# Patient Record
Sex: Female | Born: 1940 | Race: White | Hispanic: No | Marital: Married | Smoking: Former smoker
Health system: Southern US, Community
[De-identification: ages and names within clinical notes are randomized; demographics above are authoritative.]

## PROBLEM LIST (undated history)

## (undated) DIAGNOSIS — Z923 Personal history of irradiation: Secondary | ICD-10-CM

## (undated) DIAGNOSIS — H409 Unspecified glaucoma: Secondary | ICD-10-CM

## (undated) DIAGNOSIS — M1990 Unspecified osteoarthritis, unspecified site: Secondary | ICD-10-CM

## (undated) DIAGNOSIS — F0390 Unspecified dementia without behavioral disturbance: Secondary | ICD-10-CM

## (undated) DIAGNOSIS — H9190 Unspecified hearing loss, unspecified ear: Secondary | ICD-10-CM

## (undated) DIAGNOSIS — C50212 Malignant neoplasm of upper-inner quadrant of left female breast: Secondary | ICD-10-CM

## (undated) DIAGNOSIS — M810 Age-related osteoporosis without current pathological fracture: Secondary | ICD-10-CM

## (undated) DIAGNOSIS — M25511 Pain in right shoulder: Secondary | ICD-10-CM

## (undated) DIAGNOSIS — E785 Hyperlipidemia, unspecified: Secondary | ICD-10-CM

## (undated) DIAGNOSIS — R200 Anesthesia of skin: Secondary | ICD-10-CM

## (undated) DIAGNOSIS — R413 Other amnesia: Secondary | ICD-10-CM

## (undated) DIAGNOSIS — R2689 Other abnormalities of gait and mobility: Secondary | ICD-10-CM

## (undated) DIAGNOSIS — Z973 Presence of spectacles and contact lenses: Secondary | ICD-10-CM

## (undated) DIAGNOSIS — D329 Benign neoplasm of meninges, unspecified: Secondary | ICD-10-CM

## (undated) DIAGNOSIS — Z9221 Personal history of antineoplastic chemotherapy: Secondary | ICD-10-CM

## (undated) DIAGNOSIS — N6019 Diffuse cystic mastopathy of unspecified breast: Secondary | ICD-10-CM

## (undated) DIAGNOSIS — R202 Paresthesia of skin: Secondary | ICD-10-CM

## (undated) DIAGNOSIS — H269 Unspecified cataract: Secondary | ICD-10-CM

## (undated) DIAGNOSIS — M5136 Other intervertebral disc degeneration, lumbar region: Secondary | ICD-10-CM

## (undated) DIAGNOSIS — D496 Neoplasm of unspecified behavior of brain: Secondary | ICD-10-CM

## (undated) DIAGNOSIS — C50919 Malignant neoplasm of unspecified site of unspecified female breast: Secondary | ICD-10-CM

## (undated) DIAGNOSIS — M51369 Other intervertebral disc degeneration, lumbar region without mention of lumbar back pain or lower extremity pain: Secondary | ICD-10-CM

## (undated) HISTORY — DX: Other intervertebral disc degeneration, lumbar region: M51.36

## (undated) HISTORY — DX: Unspecified osteoarthritis, unspecified site: M19.90

## (undated) HISTORY — DX: Unspecified glaucoma: H40.9

## (undated) HISTORY — DX: Benign neoplasm of meninges, unspecified: D32.9

## (undated) HISTORY — DX: Pain in right shoulder: M25.511

## (undated) HISTORY — DX: Neoplasm of unspecified behavior of brain: D49.6

## (undated) HISTORY — DX: Malignant neoplasm of upper-inner quadrant of left female breast: C50.212

## (undated) HISTORY — DX: Other intervertebral disc degeneration, lumbar region without mention of lumbar back pain or lower extremity pain: M51.369

## (undated) HISTORY — DX: Unspecified hearing loss, unspecified ear: H91.90

## (undated) HISTORY — DX: Age-related osteoporosis without current pathological fracture: M81.0

## (undated) HISTORY — DX: Hyperlipidemia, unspecified: E78.5

## (undated) HISTORY — DX: Diffuse cystic mastopathy of unspecified breast: N60.19

## (undated) HISTORY — DX: Unspecified dementia, unspecified severity, without behavioral disturbance, psychotic disturbance, mood disturbance, and anxiety: F03.90

---
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
## 1993-07-28 DIAGNOSIS — D496 Neoplasm of unspecified behavior of brain: Secondary | ICD-10-CM

## 1993-07-28 HISTORY — PX: BRAIN SURGERY: SHX531

## 1993-07-28 HISTORY — DX: Neoplasm of unspecified behavior of brain: D49.6

## 1995-07-29 HISTORY — PX: BREAST BIOPSY: SHX20

## 1995-07-29 HISTORY — PX: BREAST EXCISIONAL BIOPSY: SUR124

## 2004-07-09 ENCOUNTER — Ambulatory Visit: Payer: Self-pay

## 2005-02-10 ENCOUNTER — Ambulatory Visit: Payer: Self-pay | Admitting: Ophthalmology

## 2005-03-06 ENCOUNTER — Ambulatory Visit: Payer: Self-pay | Admitting: Neurology

## 2005-08-21 ENCOUNTER — Ambulatory Visit: Payer: Self-pay

## 2006-08-25 ENCOUNTER — Ambulatory Visit: Payer: Self-pay

## 2007-09-30 ENCOUNTER — Ambulatory Visit: Payer: Self-pay

## 2008-07-28 HISTORY — PX: COLONOSCOPY: SHX174

## 2008-11-01 ENCOUNTER — Ambulatory Visit: Payer: Self-pay

## 2009-02-09 ENCOUNTER — Ambulatory Visit: Payer: Self-pay | Admitting: Unknown Physician Specialty

## 2009-11-06 ENCOUNTER — Ambulatory Visit: Payer: Self-pay

## 2010-11-08 ENCOUNTER — Ambulatory Visit: Payer: Self-pay

## 2011-11-26 ENCOUNTER — Ambulatory Visit: Payer: Self-pay

## 2012-08-12 ENCOUNTER — Ambulatory Visit: Payer: Self-pay | Admitting: Specialist

## 2013-07-30 ENCOUNTER — Emergency Department: Payer: Self-pay | Admitting: Emergency Medicine

## 2014-07-28 DIAGNOSIS — C50919 Malignant neoplasm of unspecified site of unspecified female breast: Secondary | ICD-10-CM

## 2014-07-28 HISTORY — DX: Malignant neoplasm of unspecified site of unspecified female breast: C50.919

## 2014-09-08 ENCOUNTER — Ambulatory Visit: Payer: Self-pay | Admitting: Neurology

## 2014-10-31 ENCOUNTER — Encounter
Admit: 2014-10-31 | Disposition: A | Discharge status: Home / Self Care | Payer: Self-pay | Attending: Neurology | Admitting: Neurology

## 2014-11-29 ENCOUNTER — Ambulatory Visit: Payer: Self-pay | Admitting: Physical Therapy

## 2014-12-05 ENCOUNTER — Encounter: Payer: Self-pay | Admitting: Physical Therapy

## 2014-12-05 ENCOUNTER — Ambulatory Visit: Payer: Medicare Other | Attending: Neurology | Admitting: Physical Therapy

## 2014-12-05 DIAGNOSIS — M25631 Stiffness of right wrist, not elsewhere classified: Secondary | ICD-10-CM | POA: Insufficient documentation

## 2014-12-05 DIAGNOSIS — M25551 Pain in right hip: Secondary | ICD-10-CM | POA: Diagnosis not present

## 2014-12-05 NOTE — Therapy (Signed)
[place] MAIN [facility] SERVICES [address] [place], [place], [address] Phone: [phone]   Fax:  [phone]  Physical Therapy Treatment  Patient Details  Name: [name] MRN: [id number] Date of Birth: [birth year] Referring Provider:  Vladimir Crofts, MD  Encounter Date: 12/05/2014      PT End of Session - 12/05/14 1508    Visit Number 5   Number of Visits 16   Date for PT Re-Evaluation 12/26/14   PT Start Time 3716   PT Stop Time 1500   PT Time Calculation (min) 45 min   Activity Tolerance Patient tolerated treatment well   Behavior During Therapy [facility] for tasks assessed/performed      History reviewed. No pertinent past medical history.  History reviewed. No pertinent past surgical history.  There were no vitals filed for this visit.  Visit Diagnosis:  Pain in right hip      Subjective Assessment - 12/05/14 1423    Subjective Patiient has been traveling and her h right ip pain is 5/10   Currently in Pain? Yes   Pain Score 5    Pain Location Hip   Pain Orientation Right   Pain Descriptors / Indicators Aching   Pain Type Chronic pain   Pain Onset More than a month ago   Pain Frequency Intermittent   Multiple Pain Sites No       Manual therapy including long axis distraction to right hip x 5 minutes x 3 with 0/10 pain following treatment.  Therapeutic exercise including fitter abd x 5 mintues, hip abd squat x 60 feet x 2, squat on wall with theraball, BOSU ball squats with min assist, lunges into BOSU ball x 10 bilaterally.  Gait training x 1000 feet with no pain or increased symptoms.                          PT Education - 12/05/14 1507    Education provided Yes   Person(s) Educated Patient   Methods Explanation;Demonstration   Comprehension Verbalized understanding;Returned demonstration             PT Long Term Goals - 12/05/14 1438    PT LONG TERM GOAL #1   Title (p) . Patient  will be able to ambulate > 300 feet without gait deviation independently, demonstrating age appropriate movement and coordination patterns in 8 weeks. New (Est. on 10/31/2014)   PT LONG TERM GOAL #2   Title (p) . Patient will be able to ambulate > 1000 feet with least restrictive assistive device for safety with independent community ambulation in 8 weeks. New (Est on 10/31/2014)   PT LONG TERM GOAL #3   Title (p) . Patient will be able to perform a squat without asymmetrical postural deviation/compensation in transverse anatomical plane in 8 weeks. New (Est on 10/31/2014)   PT LONG TERM GOAL #4   Title (p)  Patient will be independent with home exercise program for prevention/ self-management of hip symptoms/ difficulty in 8 weeks. New (Est. on 10/31/2014)               Plan - 12/05/14 1636    Clinical Impression Statement Fatigue still evident with cross trainer and endurance. Verbal cues needed to keep hips aligned with abduction.Patient has 5/10 prior to manual therapy and 0/10 following manual therapy. She will continue to benefit from skilled PT to improve mobiity and ambulation distance.    Pt will  benefit from skilled therapeutic intervention in order to improve on the following deficits Abnormal gait;Decreased endurance;Decreased activity tolerance;Decreased strength;Decreased balance;Decreased mobility;Difficulty walking;Pain   Rehab Potential Good   PT Frequency 2x / week   PT Duration 6 weeks   Consulted and Agree with Plan of Care Patient        Problem List There are no active problems to display for this patient.   Alanson Puls 12/05/2014, 4:48 PM  [place] MAIN [facility] SERVICES [address] [place], [place], [address] Phone: [phone]   Fax:  [phone]

## 2014-12-07 ENCOUNTER — Ambulatory Visit: Payer: Medicare Other | Attending: Neurology | Admitting: Physical Therapy

## 2014-12-07 ENCOUNTER — Ambulatory Visit: Payer: Self-pay | Admitting: Physical Therapy

## 2014-12-07 ENCOUNTER — Encounter: Payer: Self-pay | Admitting: Physical Therapy

## 2014-12-07 DIAGNOSIS — M25631 Stiffness of right wrist, not elsewhere classified: Secondary | ICD-10-CM | POA: Insufficient documentation

## 2014-12-07 DIAGNOSIS — M25551 Pain in right hip: Secondary | ICD-10-CM | POA: Diagnosis not present

## 2014-12-07 NOTE — Therapy (Signed)
[place] MAIN [facility] SERVICES [address] [place], [place], [address] Phone: [phone]   Fax:  [phone]  Physical Therapy Treatment  Patient Details  Name: [name] MRN: [id number] Date of Birth: December 27, 1940 Referring Provider:  Vladimir Crofts, MD  Encounter Date: 12/07/2014      PT End of Session - 12/07/14 1356    Visit Number 6   Number of Visits 16   Date for PT Re-Evaluation 12/26/14   PT Start Time 1443   PT Stop Time 1430   PT Time Calculation (min) 45 min   Activity Tolerance Patient tolerated treatment well   Behavior During Therapy [facility] for tasks assessed/performed      History reviewed. No pertinent past medical history.  History reviewed. No pertinent past surgical history.  There were no vitals filed for this visit.  Visit Diagnosis:  Pain in right hip      Subjective Assessment - 12/07/14 1352    Subjective Pt worked in the yard for 6 hours since the last PT visit. She says that her pain never really goes away and she is tired of it hurting. She fell backwards from a step and slid down and scrapped her left hip on the bricks.    Pain Score 7    Pain Location Hip   Pain Orientation Right   Pain Descriptors / Indicators Aching  tender   Pain Type Chronic pain   Pain Onset More than a month ago   Pain Frequency Intermittent         Therapeutic exercises:  Supine exercises instructed for strengthening of hip muscles; bridging, hip abd/ER with RTB  sidelying  Clam, SLR,  Manual therapy long axis  X 1 minute due to ankle pain it was stopped. [place] to right hip x 8 minutes at 1.5 CM constant. Patient educated about HEP and heat to decrease pain.                        PT Education - 12/07/14 1355    Person(s) Educated Patient   Methods Explanation;Demonstration   Comprehension Verbalized understanding;Returned demonstration;Verbal cues required             PT Long Term Goals  - 12/05/14 1438    PT LONG TERM GOAL #1   Title (p) . Patient will be able to ambulate > 300 feet without gait deviation independently, demonstrating age appropriate movement and coordination patterns in 8 weeks. New (Est. on 10/31/2014)   PT LONG TERM GOAL #2   Title (p) . Patient will be able to ambulate > 1000 feet with least restrictive assistive device for safety with independent community ambulation in 8 weeks. New (Est on 10/31/2014)   PT LONG TERM GOAL #3   Title (p) . Patient will be able to perform a squat without asymmetrical postural deviation/compensation in transverse anatomical plane in 8 weeks. New (Est on 10/31/2014)   PT LONG TERM GOAL #4   Title (p)  Patient will be independent with home exercise program for prevention/ self-management of hip symptoms/ difficulty in 8 weeks. New (Est. on 10/31/2014)               Plan - 12/07/14 1356    Clinical Impression Statement Patient instructed in progression of HEP. She has tender area to left hip and fatigues with exercises to hip. she will continue to benefit from skilled PT to improve strength and mobility. Verbal cues  needed to keep hips aligned with abductionMuscle fatigue but no major pain complaints. Patient advancing to red theraband for exercises Muscle fatigue but no major pain complaints. Patient advancing to red theraband for exercises         Problem List There are no active problems to display for this patient. Alanson Puls, PT, DPT  [name], [place] S 12/07/2014, 3:28 PM  [place] MAIN [facility] SERVICES [address] [place], [place], [address] Phone: [phone]   Fax:  [phone]

## 2014-12-12 ENCOUNTER — Ambulatory Visit
Admission: RE | Admit: 2014-12-12 | Discharge: 2014-12-12 | Disposition: A | Discharge status: Home / Self Care | Payer: Medicare Other | Source: Ambulatory Visit | Attending: Unknown Physician Specialty | Admitting: Unknown Physician Specialty

## 2014-12-12 ENCOUNTER — Ambulatory Visit: Payer: Medicare Other | Admitting: Physical Therapy

## 2014-12-12 ENCOUNTER — Encounter: Payer: Self-pay | Admitting: Physical Therapy

## 2014-12-12 ENCOUNTER — Other Ambulatory Visit: Payer: Self-pay | Admitting: Unknown Physician Specialty

## 2014-12-12 DIAGNOSIS — M25551 Pain in right hip: Secondary | ICD-10-CM | POA: Diagnosis not present

## 2014-12-12 DIAGNOSIS — N63 Unspecified lump in breast: Secondary | ICD-10-CM | POA: Diagnosis not present

## 2014-12-12 DIAGNOSIS — N6489 Other specified disorders of breast: Secondary | ICD-10-CM

## 2014-12-12 DIAGNOSIS — R928 Other abnormal and inconclusive findings on diagnostic imaging of breast: Secondary | ICD-10-CM

## 2014-12-12 DIAGNOSIS — M25631 Stiffness of right wrist, not elsewhere classified: Secondary | ICD-10-CM | POA: Diagnosis not present

## 2014-12-12 NOTE — Therapy (Addendum)
[place] MAIN [facility] SERVICES [address] [place], [place], [address] Phone: [phone]   Fax:  [phone]  Physical Therapy Treatment/ DC summary  Patient Details  Name: [name] MRN: [id number] Date of Birth: 28-Jan-1941 Referring Provider:  Vladimir Crofts, MD  Encounter Date: 12/12/2014      PT End of Session - 12/12/14 1330    Visit Number 7   Number of Visits 16   Date for PT Re-Evaluation 12/26/14   Activity Tolerance Patient tolerated treatment well   Behavior During Therapy [facility] for tasks assessed/performed      History reviewed. No pertinent past medical history.  Past Surgical History  Procedure Laterality Date  . Breast biopsy Left 1987    There were no vitals filed for this visit.  Visit Diagnosis:  Pain in right hip      Subjective Assessment - 12/12/14 1328    Subjective Patient is having 8/10 pain to left hip and it is rainning outside and cold. She thinks that it could be hip arthritis. Patient was recommended to see her MD due to her increased pain.       Manual therapy long axis X 30 bouts x 3, tennis ball to buttocks x 30 sec x 3 bouts with pain relief. [place] to right hip x 8 minutes at 1.5 CM constant. Patient educated about HEP and heat to decrease pain.  E-stim x 20 mintues IFC to right hip and hot pack to right hip x 20 minutes. Patient has 8/10 pain to right groin, right buttocks and down right thigh prior to treatment and is able to decrease her pain after modalities and manual therapy to 0/10.     PHYSICAL THERAPY DISCHARGE SUMMARY  Visits from Start of Care:   Current functional level related to goals / functional outcomes: Patient is able to ambulate intermediate distances    Remaining deficits:chronic pain , difficulty walking, and tenderness to palpation.    Education / Equipment: reviewed HEP. Plan: Patient agrees to discharge.  Patient goals were not met. Patient is being  discharged due to lack of progress.  ?????                          PT Education - 12/12/14 1329    Education provided Yes   Education Details Educated in her symptoms that are relieved by manual therapy compared to the modalities and that arthritis pain is relieved with long axis distraction. Educated in HEP progression.    Person(s) Educated Patient   Methods Explanation;Demonstration;Tactile cues;Verbal cues   Comprehension Verbalized understanding;Returned demonstration;Verbal cues required             PT Long Term Goals - 12/12/14 1452    PT LONG TERM GOAL #1   Title . Patient will be able to ambulate > 300 feet without gait deviation independently, demonstrating age appropriate movement and coordination patterns in 8 weeks. New (Est. on 10/31/2014)   Status Partially Met   PT LONG TERM GOAL #2   Title . Patient will be able to ambulate > 1000 feet with least restrictive assistive device for safety with independent community ambulation in 8 weeks. New (Est on 10/31/2014)   Status Partially Met   PT LONG TERM GOAL #3   Title . Patient will be able to perform a squat without asymmetrical postural deviation/compensation in transverse anatomical plane in 8 weeks. New (Est on 10/31/2014)   Status Partially  Met   PT LONG TERM GOAL #4   Title  Patient will be independent with home exercise program for prevention/ self-management of hip symptoms/ difficulty in 8 weeks. New (Est. on 10/31/2014)   Status Achieved               Plan - 12/12/14 1331    Clinical Impression Statement Patient has increased pain today and the weather is rainny and cold. She has had pain relief in past appointments with manual therapy to her hip but she reports that it is difficult on her right ankle and wants to try another type of treatment to see if it helps her pain. Patient tolerates [place] , e-stim and heat and has some pain reduction after therapy, and better pain relief to groin  after long axis distraction.  Patient has good reduction of pain after treatment but it does not last and the pain cycle begins again the next day. Patient will be DCed  From PT  and is recommended to go back to MD for follow up.     Pt will benefit from skilled therapeutic intervention in order to improve on the following deficits Abnormal gait;Decreased endurance;Decreased activity tolerance;Decreased strength;Decreased balance;Decreased mobility;Difficulty walking;Pain   Rehab Potential Good   PT Frequency 2x / week   PT Duration 6 weeks        Problem List There are no active problems to display for this patient.  Alanson Puls, PT, DPT [place], [place] S 12/12/2014, 2:53 PM  [place] MAIN [facility] SERVICES [address] [place], [place], [address] Phone: [phone]   Fax:  [phone]

## 2014-12-13 ENCOUNTER — Other Ambulatory Visit: Payer: Self-pay | Admitting: Surgery

## 2014-12-13 DIAGNOSIS — N63 Unspecified lump in unspecified breast: Secondary | ICD-10-CM

## 2014-12-13 DIAGNOSIS — R928 Other abnormal and inconclusive findings on diagnostic imaging of breast: Secondary | ICD-10-CM

## 2014-12-14 ENCOUNTER — Ambulatory Visit: Payer: Self-pay | Admitting: Physical Therapy

## 2014-12-15 ENCOUNTER — Other Ambulatory Visit: Payer: Self-pay | Admitting: Surgery

## 2014-12-15 ENCOUNTER — Ambulatory Visit
Admission: RE | Admit: 2014-12-15 | Discharge: 2014-12-15 | Disposition: A | Discharge status: Home / Self Care | Payer: Medicare Other | Source: Ambulatory Visit | Attending: Surgery | Admitting: Surgery

## 2014-12-15 ENCOUNTER — Ambulatory Visit: Admission: RE | Admit: 2014-12-15 | Payer: Medicare Other | Source: Ambulatory Visit

## 2014-12-15 DIAGNOSIS — R928 Other abnormal and inconclusive findings on diagnostic imaging of breast: Secondary | ICD-10-CM | POA: Insufficient documentation

## 2014-12-15 DIAGNOSIS — N63 Unspecified lump in unspecified breast: Secondary | ICD-10-CM

## 2014-12-15 DIAGNOSIS — C50212 Malignant neoplasm of upper-inner quadrant of left female breast: Secondary | ICD-10-CM

## 2014-12-15 HISTORY — PX: BREAST BIOPSY: SHX20

## 2014-12-15 HISTORY — DX: Malignant neoplasm of upper-inner quadrant of left female breast: C50.212

## 2014-12-19 ENCOUNTER — Ambulatory Visit: Payer: Medicare Other | Admitting: Physical Therapy

## 2014-12-20 LAB — SURGICAL PATHOLOGY

## 2014-12-21 ENCOUNTER — Ambulatory Visit: Payer: Self-pay | Admitting: Physical Therapy

## 2014-12-28 ENCOUNTER — Telehealth: Payer: Self-pay | Admitting: *Deleted

## 2014-12-28 ENCOUNTER — Ambulatory Visit: Payer: Self-pay | Admitting: Surgery

## 2014-12-28 ENCOUNTER — Encounter: Payer: Self-pay | Admitting: *Deleted

## 2014-12-28 ENCOUNTER — Telehealth: Payer: Self-pay | Admitting: Surgery

## 2014-12-28 NOTE — Telephone Encounter (Signed)
Called patient to establish navigation services. She is newly diagnosed with breast cancer. Patient was unable to hear me very well and ask that I speak with her husband, Dr. Tillman Sers.  Discussed navigation services with him.  Discussed that Dr. Pat Patrick wanted Adaleen seen by Medical Oncology prior to any surgery.  He requested an appointment with Dr. Oliva Bustard.  Patient is scheduled to see him on Tuesday at 8:00.  Talked with Dr. Rolin Barry office to fax notes and send referral info.  Dr. Rolin Barry office stated they had just left the patient a message to follow-up with Dr. Pat Patrick tomorrow if possible between 3:30 and 4:30.  They would like to pick up their educational package tomorrow to have for the weekend.  Will leave at the desk with [place] if it has not been picked up before I leave on Friday.  They are to call if they have any questions or needs.

## 2014-12-28 NOTE — Telephone Encounter (Signed)
LVM that Dr. Pat Patrick will see pt in the [facility] office between 3:30 and 4:00. Pt will call before they go to make sure Dr. Azucena Fallen get called into surg. If so, they will see Dr. Marcelline Deist next day at 11:00

## 2014-12-29 ENCOUNTER — Ambulatory Visit (INDEPENDENT_AMBULATORY_CARE_PROVIDER_SITE_OTHER): Payer: Medicare Other | Admitting: Surgery

## 2014-12-29 ENCOUNTER — Ambulatory Visit: Payer: Self-pay | Admitting: Surgery

## 2014-12-29 ENCOUNTER — Encounter: Payer: Self-pay | Admitting: Surgery

## 2014-12-29 VITALS — BP 125/79 | HR 71 | Temp 98.9°F | Ht 64.0 in | Wt 143.8 lb

## 2014-12-29 DIAGNOSIS — C50912 Malignant neoplasm of unspecified site of left female breast: Secondary | ICD-10-CM

## 2014-12-29 NOTE — Progress Notes (Signed)
Met patient and her husband yesterday. Gave patient breast cancer educational literature, "My Breast Cancer Treatment Handbook" by Josephine Igo, RN.  Offered support. To call if they have any questions or needs.

## 2014-12-29 NOTE — Progress Notes (Signed)
Surgical Consultation  12/29/2014  [name] is an 74 y.o. female with a recently discovered ductal carcinoma in situ.  CC: She had a recent screening mammography which demonstrated multiple lesions in the 10:00 position of left breast. Biopsy was recommended. She has had previous breast evaluations. She is not having history of breast cancer. She does not take current hormonal medications. She has had 4 children.  HPI: Ultrasound-guided biopsy was recommended. Into the 3 lesions there was evidence of sclerosis but in the third lesion there was a segment which demonstrated some ductal carcinoma in situ. It is important to note that the specimen was quite small and could be part of the larger lesion which might contain invasive carcinoma. She is in the office today to discuss her her options with regard to surgical intervention.    Family Breast Cancer History: She has a sister with breast cancer  History reviewed. No pertinent past medical history.  Past Surgical History  Procedure Laterality Date  . Breast biopsy Left 1987    Family History  Problem Relation Age of Onset  . Breast cancer Sister 21    Social History:  reports that she quit smoking about 50 years ago. She does not have any smokeless tobacco history on file. She reports that she drinks about 4.2 oz of alcohol per week. She reports that she does not use illicit drugs.  Allergies: No Known Allergies  Medications reviewed.   Review of Systems:   ROS   Physical Exam:  BP 125/79 mmHg  Pulse 71  Temp(Src) 98.9 F (37.2 C) (Oral)  Ht $R'5\' 4"'mK$  (1.626 m)  Wt 65.227 kg (143 lb 12.8 oz)  BMI 24.67 kg/m2  Physical Exam  General she is alert and pleasant in no significant distress well oriented.  HEENT: No significant abnormalities noted, normal eyes normal ears.  Lymph exam: No cervical or axillary adenopathy identified.  Chest: No significant findings noted on chest exam with no adventitious sounds and  normal pulmonary excursion.  Cardiac: No murmurs or gallops are noted she appears to be normal sinus rhythm.  Abdomen: No significant abnormalities are identified with active bowel sounds and no tenderness no rebound or guarding.  Musculoskeletal: She has significant right hip pain on ambulation with an abnormal gait but normal distal pulses no other deformities noted. She also has decreased range of motion in her right shoulder.  Neurologic exam: Equal symmetrical cranial nerve function no motor sensory abnormalities.  Psych exam: Normal orientation normal affect.  Breast Exam: Multiple bruising areas in the upper inner quadrant. No palpable masses are identified. Axillary areas unremarkable.    No results found for this or any previous visit (from the past 48 hour(s)). No results found.  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 We anticipate offering her mastectomy versus partial mastectomy and radiation therapy. Her husband was present for the interview and requested information regarding MammoSite radiation therapy. They have an appointment with the oncologist later next week and I asked them to outline those requests to the cancer center. In this setting we do not have any idea whether she has invasive carcinoma in that lesion and I think partial mastectomy with sentinel lymph node biopsy is reasonable option despite the fact that she has ductal carcinoma in situ at the present time. We outlined surgical risks and options in detail. They are in agreement and will get back with [place] once they have  met with oncology.  Odin III dermatitis

## 2015-01-01 ENCOUNTER — Other Ambulatory Visit: Payer: Self-pay | Admitting: General Surgery

## 2015-01-01 ENCOUNTER — Encounter: Payer: Self-pay | Admitting: General Surgery

## 2015-01-01 ENCOUNTER — Ambulatory Visit (INDEPENDENT_AMBULATORY_CARE_PROVIDER_SITE_OTHER): Payer: Medicare Other | Admitting: General Surgery

## 2015-01-01 VITALS — BP 122/66 | HR 64 | Resp 12 | Ht 64.0 in | Wt 142.0 lb

## 2015-01-01 DIAGNOSIS — D0512 Intraductal carcinoma in situ of left breast: Secondary | ICD-10-CM

## 2015-01-01 DIAGNOSIS — C50912 Malignant neoplasm of unspecified site of left female breast: Secondary | ICD-10-CM

## 2015-01-01 NOTE — Patient Instructions (Signed)
Patient is scheduled for surgery at [facility] on 01/08/15. She will arrive at the [facility] at 8:00 am that day. She will pre admit with the hospital on 01/04/15 at 9:45 am. Patient is aware of dates, times, and instructions.

## 2015-01-01 NOTE — Progress Notes (Signed)
Patient ID: [name], female   DOB: [birth year], 74 y.o.   MRN: [id number]  Chief Complaint  Patient presents with  . Other    breast cancer discussion    HPI [name] is a 74 y.o. female here today for breast cancer discussion. Patient had mammogram done on 12/12/14 at [facility] which was a routine mammogram per patient there was 3 different areas on mammogram of concern. Patient then had a Left breast biopsy done on 12/15/14 which was positive for DCIS.   The patient is under evaluation for a right hip replacement due to "bone-on-bone" pain that is present day and night. HPI  Past Medical History  Diagnosis Date  . 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 Hearing loss     Past Surgical History  Procedure Laterality Date  . Appendectomy  1950  . Brain surgery  1995    left frontal/temporal  . Colonoscopy  2010    Dr. [name]  . Breast biopsy Left 1997  . Breast biopsy Left 12/15/14    confirmed DCIS    Family History  Problem Relation Age of Onset  . Breast cancer Sister 62  . Addison's disease Mother   . Stroke Father     Social History History  Substance Use Topics  . Smoking status: Former Smoker    Quit date: 12/28/1964  . Smokeless tobacco: Never Used  . Alcohol Use: 0.6 - 1.2 oz/week    1-2 Glasses of wine per week     Comment: 1 Glass Wine / Night    No Known Allergies  Current Outpatient Prescriptions  Medication Sig Dispense Refill  . Ascorbic Acid (VITAMIN C) 100 MG tablet Take 100 mg by mouth daily.    . calcium-vitamin D 250-100 MG-UNIT per tablet Take 1 tablet by mouth 1 day or 1 dose. 600 mg    . meloxicam (MOBIC) 7.5 MG tablet Take 7.5 mg by mouth daily.    . traMADol (ULTRAM) 50 MG tablet Take by mouth 1 day or 1 dose.    . Triprolidine-Pseudoephedrine (ANTIHISTAMINE PO) Take by mouth daily.     No current facility-administered medications for this visit.    Review of Systems Review of  Systems  Constitutional: Negative.   Respiratory: Negative.   Cardiovascular: Negative.     Blood pressure 122/66, pulse 64, resp. rate 12, height 5\' 4"  (1.626 m), weight 142 lb (64.411 kg).  Physical Exam Physical Exam  Constitutional: She is oriented to person, place, and time. She appears well-developed and well-nourished.  Cardiovascular: Normal rate, regular rhythm and normal heart sounds.   Pulmonary/Chest: Effort normal and breath sounds normal. Right breast exhibits no inverted nipple, no mass, no nipple discharge, no skin change and no tenderness. Left breast exhibits tenderness. Left breast exhibits no inverted nipple, no mass, no nipple discharge and no skin change. Breasts are symmetrical.  Bruising left upper inner  Neurological: She is alert and oriented to person, place, and time.  Skin: Skin is warm and dry.    Data Reviewed 2014-2016 mammograms reviewed. New nodular density in the left breast on the 2016 study prompted ultrasound were 3 areas of abnormality were reported. Core biopsies of each obtained. The area reported is most suspicious ( 10:00, 4 cm from the nipple biopsy results were negative. At the 5 cm mark, 10:00 position of the left breast a focal area of high-grade DCIS was  noted.) Postbiopsy images reviewed showed the biopsy clips linearly orientated in the tie o'clock position of the left breast.   Assessment    Microscopic foci high-grade DCIS, ultrasound abnormalities of the adjacent tissue.    Plan    High-grade DCIS, left breast.   Options for management were reviewed: Wide excision versus mastectomy discussed. With a microscopic foci inadequate breast volume she is an excellent candidate for wide excision followed by postoperative radiation. We will plan on bracketing wires to encompass all areas initially of concern on ultrasound.   Sentinel biopsy will be deferred as the location involved would not preclude a sentinel node biopsy in the  future.      Patient is scheduled for surgery at [facility] on 01/08/15. She will arrive at the [facility] at 8:00 am that day. She will pre admit with the hospital on 01/04/15 at 9:45 am. Patient is aware of dates, times, and instructions.   QBH:ALPFX,TKWI   [name] 01/02/2015, 9:19 AM

## 2015-01-02 ENCOUNTER — Encounter: Payer: Self-pay | Admitting: Oncology

## 2015-01-02 ENCOUNTER — Inpatient Hospital Stay: Payer: Medicare Other | Attending: Oncology | Admitting: Oncology

## 2015-01-02 ENCOUNTER — Encounter: Payer: Self-pay | Admitting: *Deleted

## 2015-01-02 VITALS — BP 122/69 | HR 84 | Temp 95.7°F | Wt 143.1 lb

## 2015-01-02 DIAGNOSIS — D0512 Intraductal carcinoma in situ of left breast: Secondary | ICD-10-CM | POA: Diagnosis present

## 2015-01-02 DIAGNOSIS — L409 Psoriasis, unspecified: Secondary | ICD-10-CM | POA: Insufficient documentation

## 2015-01-02 DIAGNOSIS — M1990 Unspecified osteoarthritis, unspecified site: Secondary | ICD-10-CM | POA: Insufficient documentation

## 2015-01-02 DIAGNOSIS — D0510 Intraductal carcinoma in situ of unspecified breast: Secondary | ICD-10-CM | POA: Insufficient documentation

## 2015-01-02 DIAGNOSIS — Z87891 Personal history of nicotine dependence: Secondary | ICD-10-CM | POA: Insufficient documentation

## 2015-01-02 DIAGNOSIS — Z923 Personal history of irradiation: Secondary | ICD-10-CM | POA: Diagnosis not present

## 2015-01-02 DIAGNOSIS — M5136 Other intervertebral disc degeneration, lumbar region: Secondary | ICD-10-CM | POA: Insufficient documentation

## 2015-01-02 DIAGNOSIS — Z79899 Other long term (current) drug therapy: Secondary | ICD-10-CM | POA: Diagnosis not present

## 2015-01-02 DIAGNOSIS — D329 Benign neoplasm of meninges, unspecified: Secondary | ICD-10-CM | POA: Insufficient documentation

## 2015-01-02 DIAGNOSIS — C50912 Malignant neoplasm of unspecified site of left female breast: Secondary | ICD-10-CM

## 2015-01-02 DIAGNOSIS — C773 Secondary and unspecified malignant neoplasm of axilla and upper limb lymph nodes: Secondary | ICD-10-CM | POA: Insufficient documentation

## 2015-01-02 DIAGNOSIS — E785 Hyperlipidemia, unspecified: Secondary | ICD-10-CM | POA: Insufficient documentation

## 2015-01-02 DIAGNOSIS — Z171 Estrogen receptor negative status [ER-]: Secondary | ICD-10-CM | POA: Diagnosis not present

## 2015-01-02 DIAGNOSIS — M25551 Pain in right hip: Secondary | ICD-10-CM | POA: Insufficient documentation

## 2015-01-02 NOTE — Progress Notes (Signed)
Met with patient her husband during her initial medical oncology visit with Dr. Oliva Bustard.  Patient is scheduled for lumpectomy on Monday, June 13th.  No needs at this time.

## 2015-01-02 NOTE — Progress Notes (Signed)
[place] @ [facility] Telephone:(336) [phone]  Fax:(336) [place]: 1940/09/02  MR#: [id number]  IRS#:[id number]  Patient Care Team: [name], [name], MD (General Surgery) Self Requested  CHIEF COMPLAINT:  Chief Complaint  Patient presents with  . New Evaluation    VISIT DIAGNOSIS:     ICD-9-CM ICD-10-CM   1. Cancer of breast, left 174.9 C50.912       No history exists.    No flowsheet data found.  INTERVAL HISTORY:  74 year old lady who had a regular mammogram recent mammogram with abnormal ultrasound was done.  There were 3 nodules were found.  Patient underwent stereotactic ultrasound-guided biopsy.  3 nodules were 4 cm 5 cm in 6 cm away from nipple.  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  Slightly apprehensive but not any acute distress REVIEW OF SYSTEMS:   GENERAL:  Feels good.  Active.  No fevers, sweats or weight loss. PERFORMANCE STATUS (ECOG):   HEENT:  No visual changes, runny nose, sore throat, mouth sores or tenderness. Lungs: No shortness of breath or cough.  No hemoptysis. Cardiac:  No chest pain, palpitations, orthopnea, or PND. GI:  No nausea, vomiting, diarrhea, constipation, melena or hematochezia. GU:  No urgency, frequency, dysuria, or hematuria. Prior use of birth control pills several years ago.  Did not use any estrogen or any other hormone replacement therapy Musculoskeletal: Right hip pain .  She is considering surgical intervention Extremities:  No pain or swelling. Skin:  No rashes or skin changes. Neuro:  No headache, numbness or weakness, balance or coordination issues. Endocrine:  No diabetes, thyroid issues, hot flashes or night sweats. Psych:  No mood changes, depression or  anxiety. Pain:  No focal pain. Review of systems:  All other systems reviewed and found to be negative. As per HPI. Otherwise, a complete review of systems is negatve.  PAST MEDICAL HISTORY: Past Medical History  Diagnosis Date  . 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 Hearing loss     PAST SURGICAL HISTORY: Past Surgical History  Procedure Laterality Date  . Appendectomy  1950  . Brain surgery  1995    left frontal/temporal  . Colonoscopy  2010    Dr. [name]  . Breast biopsy Left 1997  . Breast biopsy Left 12/15/14    confirmed DCIS    FAMILY HISTORY Family History  Problem Relation Age of Onset  . Breast cancer Sister 36  . Addison's disease Mother   . Stroke Father            HEALTH MAINTENANCE: History  Substance Use Topics  . Smoking status: Former Smoker    Quit date: 12/28/1964  . Smokeless tobacco: Never Used  . Alcohol Use: 0.6 - 1.2 oz/week    1-2 Glasses of wine per week     Comment: 1 Glass Wine / Night      No Known Allergies  Current Outpatient Prescriptions  Medication Sig Dispense Refill  . calcium-vitamin D 250-100 MG-UNIT per tablet Take 1 tablet by mouth 1 day or 1 dose. 600 mg    . meloxicam (MOBIC) 7.5 MG tablet Take 7.5 mg by mouth daily.    Marland Kitchen  traMADol (ULTRAM) 50 MG tablet Take by mouth 1 day or 1 dose.    . Triprolidine-Pseudoephedrine (ANTIHISTAMINE PO) Take by mouth daily.    . Ascorbic Acid (VITAMIN C) 100 MG tablet Take 100 mg by mouth daily.     No current facility-administered medications for this visit.    OBJECTIVE: PHYSICAL EXAM: GENERAL:  Well developed, well nourished, sitting comfortably in the exam room in no acute distress. MENTAL STATUS:  Alert and oriented to person, place and time. HEAD: .  Normocephalic, atraumatic, face symmetric, no Cushingoid features. EYES:  Pupils equal round and reactive to light and accomodation.  No conjunctivitis or scleral  icterus. ENT:  Oropharynx clear without lesion.  Tongue normal. Mucous membranes moist.  RESPIRATORY:  Clear to auscultation without rales, wheezes or rhonchi. CARDIOVASCULAR:  Regular rate and rhythm without murmur, rub or gallop. BREAST:  Right breast without masses, skin changes or nipple discharge.  Left breast without masses, skin changes or nipple discharge.  Left breast :Some ecchymosis from the previous biopsy ABDOMEN:  Soft, non-tender, with active bowel sounds, and no hepatosplenomegaly.  No masses. BACK:  No CVA tenderness.  No tenderness on percussion of the back or rib cage. SKIN:  No rashes, ulcers or lesions. EXTREMITIES: No edema, no skin discoloration or tenderness.  No palpable cords. LYMPH NODES: No palpable cervical, supraclavicular, axillary or inguinal adenopathy  NEUROLOGICAL: Unremarkable. PSYCH:  Appropriate.  Filed Vitals:   01/02/15 0821  BP: 122/69  Pulse: 84  Temp: 95.7 F (35.4 C)     Body mass index is 24.55 kg/(m^2).    ECOG FS:0 - Asymptomatic  LAB RESULTS:  No visits with results within 2 Day(s) from this visit. Latest known visit with results is:  Hospital Outpatient Visit on 12/15/2014  Component Date Value Ref Range Status  . SURGICAL PATHOLOGY 12/15/2014    Final-Edited                   Value:Surgical Pathology CASE: [id number] PATIENT: [facility] Surgical Pathology Report     SPECIMEN SUBMITTED: A. Left breast biopsy, 10 oc 4cm B. Left breast biopsy, 10 oc 5 cm C. Left breast biopsy, 10 oc 6cm  CLINICAL HISTORY: None provided  PRE-OPERATIVE DIAGNOSIS: None Provided  POST-OPERATIVE DIAGNOSIS: None provided     DIAGNOSIS: A. LEFT BREAST, 10:00, 4 CM; BIOPSY: - BENIGN BREAST TISSUE WITH STROMAL FIBROSIS AND SCLEROSING ADENOSIS.  B. 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  C. LEFT BREAST, 10:00, 6 CM; BIOPSY: - BENIGN BREAST  TISSUE WITH STROMAL FIBROSIS AND SCLEROSING ADENOSIS.  Comment There are no specific changes seen in parts A and C to account for the ultrasound appearance.  Regarding the left at 10:00 (5 cm) biopsy (part B), there is only a tiny amount of tumor in the sample and it cannot be determined if invasion is present.  Further ima                         ging and/or biopsies may be necessary to determine the extent of disease.  Multiple additional deeper HE stained sections were examined.  The findings were discussed with Otila Kluver in the office of Dr. Pat Patrick on 10/19/14 by Dr. Luana Shu.   GROSS DESCRIPTION: A. The specimen is received in a formalin filled container labeled with the patient's name and left breast 10:00 4 cm from nipple.  Core  pieces: 3 Measurement: Aggregate 1.5 x 0.8 x 0.2 cm Comments: Pink-yellow cylindrically shaped tissue fragments Ink color: Blue  Entirely submitted in cassette(s): 1  Time/Date in fixative: 9:12 AM on 12/15/2014 Total fixation time: 6-1/2 hours, Cold ischemic time not given B. The specimen is received in a formalin filled container labeled with the patient's name and left breast 10:00 5 cm from nipple.  Core pieces: 3 Measurement: Aggregate 1 x 0.9 x 0.2 cm Comments: Pink red cylindrical shaped tissue fragments admixed with blood clot Ink color: Plaque  Entirely submitt                         ed in cassette(s): 1  Time/Date in fixative: 9:25 AM on 12/15/2014 Total fixation time: 6-1/2 hours, Cold ischemic time not given C. The specimen is received left breast in a formalin filled container labeled with the patient's name and left breast 10:00 6 cm from nipple.  Core pieces: 1 Measurement: 0.7 x 0.6 x 0.2 cm Comments: Pink red cylindrical shaped tissue fragment admixed with blood clot Ink color: Green  Entirely submitted in cassette(s): 1  Time/Date in fixative: 9:42 AM on 12/15/2014 Total fixation time: 6 hours, Cold ischemic  time not given        Final Diagnosis performed by Delorse Lek, MD.  Electronically signed 12/19/2014 2:47:36PM    The electronic signature indicates that the named Attending Pathologist has evaluated the specimen  Technical component performed at [facility], [address] [place], [place] [address] Lab: [phone] Dir: Darrick Penna. Evette Doffing, MD  Professional component performed at [facility], [facility],                          [place], [place],  [address] Lab: [phone] Dir: Dellia Nims. Rubinas, MD       STUDIES: Mm Digital Diagnostic Unilat L  12/15/2014   CLINICAL DATA:  Ultrasound-guided core needle biopsy of 3 adjacent masses in the upper inner quadrant of the left breast earlier today. Confirmation of clip placement.  EXAM: DIAGNOSTIC LEFT MAMMOGRAM POST ULTRASOUND BIOPSY  COMPARISON:  Previous exam(s).  FINDINGS: Mammographic images were obtained following ultrasound guided biopsy of 3 adjacent masses in the upper outer quadrant of the left breast at the 10 o'clock position, 4-6 cm from the nipple. The wing shaped tissue marker clip was placed in the mass at 10 o'clock, 4 cm, the most suspicious of the 3 masses. The ribbon shaped tissue marker clip was placed in the mass at 10 o'clock, 5 cm. The coil shaped tissue marker clip was placed in the mass at 10 o'clock 6 cm, and this corresponds to the mass identified on screening mammography.  IMPRESSION: Appropriate positioning of all 3 tissue marker clips in the upper inner quadrant of the left breast. The coil shaped tissue marker clip in the mass at 10 o'clock, 6 cm from the nipple, corresponds to the screening mammographic finding. The wing shaped tissue marker clip in the mass at 10 o'clock, 4 cm from the nipple, corresponds to the most suspicious mass at ultrasound.  Final Assessment: Post Procedure Mammograms for Marker Placement   Electronically Signed   By: Evangeline Dakin M.D.   On: 12/15/2014  11:48   US Breast Ltd Uni Left Inc Axilla  12/12/2014   CLINICAL DATA:  Call back from screening for a possible left breast mass.  EXAM: DIGITAL DIAGNOSTIC LEFT MAMMOGRAM WITH 3D TOMOSYNTHESIS  WITH CAD  ULTRASOUND LEFT BREAST  COMPARISON:  Prior exams.  Current screening study dated 12/05/2014.  ACR Breast Density Category d: The breast tissue is extremely dense, which lowers the sensitivity of mammography.  FINDINGS: On the additional images, there is an oval mass which persists in the medial left breast, posterior third. Has partly circumscribed margins. There may be a smaller mass adjacent to this. The mass may be new prior studies although this is difficult to tell with certainty given the overall dense underlying fibroglandular tissue. Mass may have been previously obscured.  Mammographic images were processed with CAD.  On physical exam, no discrete mass is palpated in the inner left breast.  Targeted ultrasound is performed, showing 3 abnormalities in the medial left breast, at 10 o'clock. Two or discrete, the largest being oval, hypoechoic with mild increased through transmission of the sound beam, measuring 14 mm x 6 mm x 13 mm. This lies 6 cm from the nipple. There is lesion directly adjacent to this approximate 5 cm the nipple, oval with smooth circumscribed margins, hypoechoic anechoic, with increased through transmission of the sound beam. This lies posterior is well. It measures 11 mm in long axis. Third lesion is more ill-defined and irregular, hypoechoic with heterogeneous echogenicity and areas of increased echogenicity within it. It measures 1 cm size. It is more focal on the radial view that on the antiradial view. It lies 4 cm the nipple at 10 o'clock.  IMPRESSION: 1. Suspicious findings in the medial left breast. There are 2 discrete masses, the larger which is likely fibroadenoma. The oval lesion adjacent to this is more likely a cyst. The third abnormality is a more ill-defined focal  hypoechoic area with shadowing and some internal foci of increased echogenicity suggesting calcification. This may be focal fibrocystic change. Malignancy is possible.  RECOMMENDATION: Ultrasound-guided core needle biopsy recommended for the 14 mm mass at 10 o'clock, 6 cm the nipple, and a more irregular focal area of hypo echogenicity and shadowing the 10 o'clock position, 4 cm from the nipple measuring approximate 1 cm size. The third lesion, which is most likely a cyst, could be biopsied or aspirated at that same time.  I have discussed the findings and recommendations with the patient. Results were also provided in writing at the conclusion of the visit. If applicable, a reminder letter will be sent to the patient regarding the next appointment.  BI-RADS CATEGORY  4: Suspicious.   Electronically Signed   By: Lajean Manes M.D.   On: 12/12/2014 10:48   Mm Diag Breast Tomo Uni Left  12/12/2014   CLINICAL DATA:  Call back from screening for a possible left breast mass.  EXAM: DIGITAL DIAGNOSTIC LEFT MAMMOGRAM WITH 3D TOMOSYNTHESIS WITH CAD  ULTRASOUND LEFT BREAST  COMPARISON:  Prior exams.  Current screening study dated 12/05/2014.  ACR Breast Density Category d: The breast tissue is extremely dense, which lowers the sensitivity of mammography.  FINDINGS: On the additional images, there is an oval mass which persists in the medial left breast, posterior third. Has partly circumscribed margins. There may be a smaller mass adjacent to this. The mass may be new prior studies although this is difficult to tell with certainty given the overall dense underlying fibroglandular tissue. Mass may have been previously obscured.  Mammographic images were processed with CAD.  On physical exam, no discrete mass is palpated in the inner left breast.  Targeted ultrasound is performed, showing 3 abnormalities in the medial left breast, at  10 o'clock. Two or discrete, the largest being oval, hypoechoic with mild increased through  transmission of the sound beam, measuring 14 mm x 6 mm x 13 mm. This lies 6 cm from the nipple. There is lesion directly adjacent to this approximate 5 cm the nipple, oval with smooth circumscribed margins, hypoechoic anechoic, with increased through transmission of the sound beam. This lies posterior is well. It measures 11 mm in long axis. Third lesion is more ill-defined and irregular, hypoechoic with heterogeneous echogenicity and areas of increased echogenicity within it. It measures 1 cm size. It is more focal on the radial view that on the antiradial view. It lies 4 cm the nipple at 10 o'clock.  IMPRESSION: 1. Suspicious findings in the medial left breast. There are 2 discrete masses, the larger which is likely fibroadenoma. The oval lesion adjacent to this is more likely a cyst. The third abnormality is a more ill-defined focal hypoechoic area with shadowing and some internal foci of increased echogenicity suggesting calcification. This may be focal fibrocystic change. Malignancy is possible.  RECOMMENDATION: Ultrasound-guided core needle biopsy recommended for the 14 mm mass at 10 o'clock, 6 cm the nipple, and a more irregular focal area of hypo echogenicity and shadowing the 10 o'clock position, 4 cm from the nipple measuring approximate 1 cm size. The third lesion, which is most likely a cyst, could be biopsied or aspirated at that same time.  I have discussed the findings and recommendations with the patient. Results were also provided in writing at the conclusion of the visit. If applicable, a reminder letter will be sent to the patient regarding the next appointment.  BI-RADS CATEGORY  4: Suspicious.   Electronically Signed   By: Lajean Manes M.D.   On: 12/12/2014 10:48   [place] Lt Breast Bx W Loc Dev 1st Lesion Img Bx Spec US Guide  12/26/2014   ADDENDUM REPORT: 12/26/2014 13:09  ADDENDUM: Pathology has returned. The first and third specimens were benign breast tissue with stromal fibrosis and  sclerosing adenosis. 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 Doctor Laurey Morale found and spoke with the patient's husband, Dr. Tobe Sos, and relayed these results. Findings above are concordant with imaging.   Electronically Signed   By: Lajean Manes M.D.   On: 12/26/2014 13:09   12/26/2014   ADDENDUM REPORT: 12/26/2014 13:09  ADDENDUM: Pathology has returned. The first and third specimens were benign breast tissue with stromal fibrosis and sclerosing adenosis. 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 Doctor Laurey Morale found and spoke with the patient's husband, Dr. Tobe Sos, and relayed these results. Findings above are concordant with imaging.   Electronically Signed   By: Lajean Manes M.D.   On: 12/26/2014 13:09   12/26/2014   CLINICAL DATA:  Screening detected mass in the upper inner quadrant of the left breast. At diagnostic workup, 3 adjacent indeterminate masses were identified in the upper inner quadrant.  EXAM: ULTRASOUND GUIDED LEFT BREAST CORE NEEDLE BIOPSY x 3  COMPARISON:  Previous exam(s).  PROCEDURE: I met with the patient and we discussed the procedure of ultrasound-guided biopsy, including benefits and alternatives. We discussed the high likelihood of a successful procedure. We discussed the risks of the procedure including infection, bleeding, tissue injury, clip migration, and inadequate sampling. Informed written consent was given. The usual time-out protocol was performed immediately prior to the procedure.  Using sterile technique and 2% Lidocaine as local anesthetic, under direct ultrasound visualization, a 12 gauge vacuum-assisted device  was used to initially perform core biopsy of the 10 mm mass at the 10 o'clock position of the left breast approximately 4 cm from the nipple, the most suspicious of the 3 masses, using a lateral approach. At the conclusion of this portion of the procedure, a wing shaped tissue marker clip was deployed into the biopsy cavity.   Next, using sterile technique and 2% lidocaine as local anesthetic, under direct ultrasound visualization, I placed an 18 gauge spinal needle into the 11 mm mass at the 10 o'clock position of the left breast approximately 5 cm from the nipple, but was unable to aspirate fluid. Therefore, a 12 gauge vacuum assisted device was used to perform core biopsy of this mass using the same lateral approach. At the conclusion of this portion of the procedure, a ribbon shaped tissue marker clip was deployed into the biopsy cavity.  Finally, using sterile technique and 2% lidocaine as local anesthetic, under direct ultrasound visualization, I placed an 18 gauge spinal needle into the 13 mm mass at the 10 o'clock position of the left breast approximately 6 cm from the nipple, but was unable to aspirate fluid. Therefore, a 12 gauge vacuum assisted device was used to perform core biopsy of this mass using a separate lateral approach. At the conclusion of this portion of the procedure, a coil shaped tissue marker clip was deployed into the biopsy cavity.  Follow-up 2-view mammogram was performed and dictated separately.  IMPRESSION: Ultrasound-guided biopsy of 3 adjacent solid masses in the upper inner quadrant of the left breast. No apparent complications.  Electronically Signed: By: Evangeline Dakin M.D. On: 12/15/2014 11:16   [place] Lt Breast Bx W Loc Dev Ea Add Lesion Img Bx Spec US Guide  12/26/2014   ADDENDUM REPORT: 12/26/2014 13:09  ADDENDUM: Pathology has returned. The first and third specimens were benign breast tissue with stromal fibrosis and sclerosing adenosis. 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 Doctor Laurey Morale found and spoke with the patient's husband, Dr. Tobe Sos, and relayed these results. Findings above are concordant with imaging.   Electronically Signed   By: Lajean Manes M.D.   On: 12/26/2014 13:09   12/26/2014   ADDENDUM REPORT: 12/26/2014 13:09  ADDENDUM: Pathology has returned. The  first and third specimens were benign breast tissue with stromal fibrosis and sclerosing adenosis. 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 Doctor Laurey Morale found and spoke with the patient's husband, Dr. Tobe Sos, and relayed these results. Findings above are concordant with imaging.   Electronically Signed   By: Lajean Manes M.D.   On: 12/26/2014 13:09   12/26/2014   CLINICAL DATA:  Screening detected mass in the upper inner quadrant of the left breast. At diagnostic workup, 3 adjacent indeterminate masses were identified in the upper inner quadrant.  EXAM: ULTRASOUND GUIDED LEFT BREAST CORE NEEDLE BIOPSY x 3  COMPARISON:  Previous exam(s).  PROCEDURE: I met with the patient and we discussed the procedure of ultrasound-guided biopsy, including benefits and alternatives. We discussed the high likelihood of a successful procedure. We discussed the risks of the procedure including infection, bleeding, tissue injury, clip migration, and inadequate sampling. Informed written consent was given. The usual time-out protocol was performed immediately prior to the procedure.  Using sterile technique and 2% Lidocaine as local anesthetic, under direct ultrasound visualization, a 12 gauge vacuum-assisted device was used to initially perform core biopsy of the 10 mm mass at the 10 o'clock position of the left breast approximately  4 cm from the nipple, the most suspicious of the 3 masses, using a lateral approach. At the conclusion of this portion of the procedure, a wing shaped tissue marker clip was deployed into the biopsy cavity.  Next, using sterile technique and 2% lidocaine as local anesthetic, under direct ultrasound visualization, I placed an 18 gauge spinal needle into the 11 mm mass at the 10 o'clock position of the left breast approximately 5 cm from the nipple, but was unable to aspirate fluid. Therefore, a 12 gauge vacuum assisted device was used to perform core biopsy of this mass using the same  lateral approach. At the conclusion of this portion of the procedure, a ribbon shaped tissue marker clip was deployed into the biopsy cavity.  Finally, using sterile technique and 2% lidocaine as local anesthetic, under direct ultrasound visualization, I placed an 18 gauge spinal needle into the 13 mm mass at the 10 o'clock position of the left breast approximately 6 cm from the nipple, but was unable to aspirate fluid. Therefore, a 12 gauge vacuum assisted device was used to perform core biopsy of this mass using a separate lateral approach. At the conclusion of this portion of the procedure, a coil shaped tissue marker clip was deployed into the biopsy cavity.  Follow-up 2-view mammogram was performed and dictated separately.  IMPRESSION: Ultrasound-guided biopsy of 3 adjacent solid masses in the upper inner quadrant of the left breast. No apparent complications.  Electronically Signed: By: Evangeline Dakin M.D. On: 12/15/2014 11:16   [place] Lt Breast Bx W Loc Dev Ea Add Lesion Img Bx Spec US Guide  12/26/2014   ADDENDUM REPORT: 12/26/2014 13:09  ADDENDUM: Pathology has returned. The first and third specimens were benign breast tissue with stromal fibrosis and sclerosing adenosis. 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 Doctor Laurey Morale found and spoke with the patient's husband, Dr. Tobe Sos, and relayed these results. Findings above are concordant with imaging.   Electronically Signed   By: Lajean Manes M.D.   On: 12/26/2014 13:09   12/26/2014   ADDENDUM REPORT: 12/26/2014 13:09  ADDENDUM: Pathology has returned. The first and third specimens were benign breast tissue with stromal fibrosis and sclerosing adenosis. 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 Doctor Laurey Morale found and spoke with the patient's husband, Dr. Tobe Sos, and relayed these results. Findings above are concordant with imaging.   Electronically Signed   By: Lajean Manes M.D.   On: 12/26/2014 13:09   12/26/2014    CLINICAL DATA:  Screening detected mass in the upper inner quadrant of the left breast. At diagnostic workup, 3 adjacent indeterminate masses were identified in the upper inner quadrant.  EXAM: ULTRASOUND GUIDED LEFT BREAST CORE NEEDLE BIOPSY x 3  COMPARISON:  Previous exam(s).  PROCEDURE: I met with the patient and we discussed the procedure of ultrasound-guided biopsy, including benefits and alternatives. We discussed the high likelihood of a successful procedure. We discussed the risks of the procedure including infection, bleeding, tissue injury, clip migration, and inadequate sampling. Informed written consent was given. The usual time-out protocol was performed immediately prior to the procedure.  Using sterile technique and 2% Lidocaine as local anesthetic, under direct ultrasound visualization, a 12 gauge vacuum-assisted device was used to initially perform core biopsy of the 10 mm mass at the 10 o'clock position of the left breast approximately 4 cm from the nipple, the most suspicious of the 3 masses, using a lateral approach. At the conclusion of this portion  of the procedure, a wing shaped tissue marker clip was deployed into the biopsy cavity.  Next, using sterile technique and 2% lidocaine as local anesthetic, under direct ultrasound visualization, I placed an 18 gauge spinal needle into the 11 mm mass at the 10 o'clock position of the left breast approximately 5 cm from the nipple, but was unable to aspirate fluid. Therefore, a 12 gauge vacuum assisted device was used to perform core biopsy of this mass using the same lateral approach. At the conclusion of this portion of the procedure, a ribbon shaped tissue marker clip was deployed into the biopsy cavity.  Finally, using sterile technique and 2% lidocaine as local anesthetic, under direct ultrasound visualization, I placed an 18 gauge spinal needle into the 13 mm mass at the 10 o'clock position of the left breast approximately 6 cm from the nipple,  but was unable to aspirate fluid. Therefore, a 12 gauge vacuum assisted device was used to perform core biopsy of this mass using a separate lateral approach. At the conclusion of this portion of the procedure, a coil shaped tissue marker clip was deployed into the biopsy cavity.  Follow-up 2-view mammogram was performed and dictated separately.  IMPRESSION: Ultrasound-guided biopsy of 3 adjacent solid masses in the upper inner quadrant of the left breast. No apparent complications.  Electronically Signed: By: Evangeline Dakin M.D. On: 12/15/2014 11:16    ASSESSMENT:  69 -year-old lady with a history of carcinoma breast.  Stage  0 tumor carcinoma in situ Status post biopsy. right hip pain Previous history of meningioma treated with radiation therapy     PLAN:   I had detailed discussion regarding local and systemic adjuvant therapy.  Case was discussed in tumor conference.  Plan is to proceed with lumpectomy and depending on the final pathology report consideration for further treatment like local radiation therapy and anti-hormonal therapy depending on the receptors can be considered at a later date. Patient and the husband and number of questions which were answered  Patient expressed understanding and was in agreement with this plan. She also understands that She can call clinic at any time with any questions, concerns, or complaints.    DCIS (ductal carcinoma in situ) of breast   Staging form: Breast, AJCC 7th Edition     Clinical: Stage 0 (Tis (DCIS), N0, M0) - Unsigned   [name], MD   01/02/2015 4:44 PM

## 2015-01-02 NOTE — Progress Notes (Signed)
Patient former smoker.  States she may have smoked a pack a month. Quit 1974.  Patient does have living will.  Patient referred by Dr. Bary Castilla for left breast cancer.

## 2015-01-02 NOTE — H&P (Signed)
Patient ID: [name], female   DOB: 01/05/1941, 74 y.o.   MRN: [id number]    Chief Complaint   Patient presents with   .  Other       breast cancer discussion      HPI [name] is a 74 y.o. female here today for breast cancer discussion. Patient had mammogram done on 12/12/14 at [facility] which was a routine mammogram per patient there was 3 different areas on mammogram of concern. Patient then had a Left breast biopsy done on 12/15/14 which was positive for DCIS.    The patient is under evaluation for a right hip replacement due to "bone-on-bone" pain that is present day and night. HPI    Past Medical History   Diagnosis  Date   .  Brain tumor  1995       meningeoma   .  Cancer  12/15/14       Left Breast Cancer DCIS    .  Hearing loss         Past Surgical History   Procedure  Laterality  Date   .  Appendectomy    1950   .  Brain surgery    1995       left frontal/temporal   .  Colonoscopy    2010       Dr. [name]   .  Breast biopsy  Left  1997   .  Breast biopsy  Left  12/15/14       confirmed DCIS       Family History   Problem  Relation  Age of Onset   .  Breast cancer  Sister  68   .  Addison's disease  Mother     .  Stroke  Father        Social History History   Substance Use Topics   .  Smoking status:  Former Smoker       Quit date:  12/28/1964   .  Smokeless tobacco:  Never Used   .  Alcohol Use:  0.6 - 1.2 oz/week       1-2 Glasses of wine per week         Comment: 1 Glass Wine / Night      No Known Allergies    Current Outpatient Prescriptions   Medication  Sig  Dispense  Refill   .  Ascorbic Acid (VITAMIN C) 100 MG tablet  Take 100 mg by mouth daily.       .  calcium-vitamin D 250-100 MG-UNIT per tablet  Take 1 tablet by mouth 1 day or 1 dose. 600 mg       .  meloxicam (MOBIC) 7.5 MG tablet  Take 7.5 mg by mouth daily.       .  traMADol (ULTRAM) 50 MG tablet  Take by mouth 1 day or 1 dose.       .  Triprolidine-Pseudoephedrine  (ANTIHISTAMINE PO)  Take by mouth daily.           No current facility-administered medications for this visit.      Review of Systems Review of Systems  Constitutional: Negative.   Respiratory: Negative.   Cardiovascular: Negative.     Blood pressure 122/66, pulse 64, resp. rate 12, height 5\' 4"  (1.626 m), weight 142 lb (64.411 kg).   Physical Exam Physical Exam  Constitutional: She is oriented to person, place, and time. She appears well-developed and well-nourished.  Cardiovascular: Normal rate,  regular rhythm and normal heart sounds.   Pulmonary/Chest: Effort normal and breath sounds normal. Right breast exhibits no inverted nipple, no mass, no nipple discharge, no skin change and no tenderness. Left breast exhibits tenderness. Left breast exhibits no inverted nipple, no mass, no nipple discharge and no skin change. Breasts are symmetrical.  Bruising left upper inner  Neurological: She is alert and oriented to person, place, and time.  Skin: Skin is warm and dry.    Data Reviewed 2014-2016 mammograms reviewed. New nodular density in the left breast on the 2016 study prompted ultrasound were 3 areas of abnormality were reported. Core biopsies of each obtained. The area reported is most suspicious ( 10:00, 4 cm from the nipple biopsy results were negative. At the 5 cm mark, 10:00 position of the left breast a focal area of high-grade DCIS was noted.) Postbiopsy images reviewed showed the biopsy clips linearly orientated in the tie o'clock position of the left breast.     Assessment Microscopic foci high-grade DCIS, ultrasound abnormalities of the adjacent tissue.   Plan High-grade DCIS, left breast.     Options for management were reviewed: Wide excision versus mastectomy discussed. With a microscopic foci inadequate breast volume she is an excellent candidate for wide excision followed by postoperative radiation. We will plan on bracketing wires to encompass all areas  initially of concern on ultrasound.    Sentinel biopsy will be deferred as the location involved would not preclude a sentinel node biopsy in the future.     Patient is scheduled for surgery at [facility] on 01/08/15. She will arrive at the [facility] at 8:00 am that day. She will pre admit with the hospital on 01/04/15 at 9:45 am. Patient is aware of dates, times, and instructions.    ALP:FXTKW,IOXB     [name] 01/02/2015, 9:19 AM

## 2015-01-04 ENCOUNTER — Encounter
Admission: RE | Admit: 2015-01-04 | Discharge: 2015-01-04 | Disposition: A | Discharge status: Home / Self Care | Payer: Medicare Other | Source: Ambulatory Visit | Attending: General Surgery | Admitting: General Surgery

## 2015-01-04 DIAGNOSIS — F1099 Alcohol use, unspecified with unspecified alcohol-induced disorder: Secondary | ICD-10-CM | POA: Insufficient documentation

## 2015-01-04 DIAGNOSIS — C50919 Malignant neoplasm of unspecified site of unspecified female breast: Secondary | ICD-10-CM | POA: Diagnosis not present

## 2015-01-04 DIAGNOSIS — Z01810 Encounter for preprocedural cardiovascular examination: Secondary | ICD-10-CM | POA: Diagnosis not present

## 2015-01-04 DIAGNOSIS — Z01818 Encounter for other preprocedural examination: Secondary | ICD-10-CM | POA: Diagnosis not present

## 2015-01-04 NOTE — Patient Instructions (Signed)
  Your procedure is scheduled on: 01/08/15 Report to Day Surgery. To find out your arrival time please call [phone] between 1PM - 3PM on 01/05/15  Remember: Instructions that are not followed completely may result in serious medical risk, up to and including death, or upon the discretion of your surgeon and anesthesiologist your surgery may need to be rescheduled.    _x___ 1. Do not eat food or drink liquids after midnight. No gum chewing or hard candies.     ___x_ 2. No Alcohol for 24 hours before or after surgery.   ____ 3. Bring all medications with you on the day of surgery if instructed.    __x__ 4. Notify your doctor if there is any change in your medical condition     (cold, fever, infections).     Do not wear jewelry, make-up, hairpins, clips or nail polish.  Do not wear lotions, powders, or perfumes. You may wear deodorant.  Do not shave 48 hours prior to surgery. Men may shave face and neck.  Do not bring valuables to the hospital.    [facility] is not responsible for any belongings or valuables.               Contacts, dentures or bridgework may not be worn into surgery.  Leave your suitcase in the car. After surgery it may be brought to your room.  For patients admitted to the hospital, discharge time is determined by your                treatment team.   Patients discharged the day of surgery will not be allowed to drive home.   Please read over the following fact sheets that you were given:   Surgical Site Infection Prevention   ____ Take these medicines the morning of surgery with A SIP OF WATER:    1. tramadol  2. Allergy Medication  3.   4.  5.  6.  ____ Fleet Enema (as directed)   __x__ Use CHG Soap as directed  ____ Use inhalers on the day of surgery  ____ Stop metformin 2 days prior to surgery    ____ Take 1/2 of usual insulin dose the night before surgery and none on the morning of surgery.   ____ Stop Coumadin/Plavix/aspirin on   __x__  Stop Anti-inflammatories on  Take only tylenol   __x__ Stop supplements until after surgery.    ____ Bring C-Pap to the hospital.

## 2015-01-08 ENCOUNTER — Ambulatory Visit
Admission: RE | Admit: 2015-01-08 | Discharge: 2015-01-08 | Disposition: A | Discharge status: Home / Self Care | Payer: Medicare Other | Source: Ambulatory Visit | Attending: General Surgery | Admitting: General Surgery

## 2015-01-08 ENCOUNTER — Ambulatory Visit: Payer: Medicare Other

## 2015-01-08 ENCOUNTER — Encounter: Payer: Self-pay | Admitting: *Deleted

## 2015-01-08 ENCOUNTER — Encounter
Admission: RE | Disposition: A | Discharge status: Home / Self Care | Payer: Self-pay | Source: Ambulatory Visit | Attending: General Surgery

## 2015-01-08 ENCOUNTER — Ambulatory Visit: Payer: Medicare Other | Admitting: *Deleted

## 2015-01-08 DIAGNOSIS — Z823 Family history of stroke: Secondary | ICD-10-CM | POA: Diagnosis not present

## 2015-01-08 DIAGNOSIS — Z86011 Personal history of benign neoplasm of the brain: Secondary | ICD-10-CM | POA: Diagnosis not present

## 2015-01-08 DIAGNOSIS — Z8349 Family history of other endocrine, nutritional and metabolic diseases: Secondary | ICD-10-CM | POA: Diagnosis not present

## 2015-01-08 DIAGNOSIS — Z87891 Personal history of nicotine dependence: Secondary | ICD-10-CM | POA: Diagnosis not present

## 2015-01-08 DIAGNOSIS — D0512 Intraductal carcinoma in situ of left breast: Secondary | ICD-10-CM | POA: Diagnosis not present

## 2015-01-08 DIAGNOSIS — Z79899 Other long term (current) drug therapy: Secondary | ICD-10-CM | POA: Insufficient documentation

## 2015-01-08 DIAGNOSIS — C50912 Malignant neoplasm of unspecified site of left female breast: Secondary | ICD-10-CM

## 2015-01-08 DIAGNOSIS — Z9889 Other specified postprocedural states: Secondary | ICD-10-CM | POA: Diagnosis not present

## 2015-01-08 DIAGNOSIS — H9190 Unspecified hearing loss, unspecified ear: Secondary | ICD-10-CM | POA: Insufficient documentation

## 2015-01-08 DIAGNOSIS — Z853 Personal history of malignant neoplasm of breast: Secondary | ICD-10-CM | POA: Insufficient documentation

## 2015-01-08 DIAGNOSIS — Z803 Family history of malignant neoplasm of breast: Secondary | ICD-10-CM | POA: Insufficient documentation

## 2015-01-08 DIAGNOSIS — C50512 Malignant neoplasm of lower-outer quadrant of left female breast: Secondary | ICD-10-CM | POA: Diagnosis not present

## 2015-01-08 HISTORY — PX: BREAST LUMPECTOMY: SHX2

## 2015-01-08 HISTORY — PX: BREAST BIOPSY: SHX20

## 2015-01-08 SURGERY — BREAST BIOPSY WITH NEEDLE LOCALIZATION
Anesthesia: General | Site: Breast | Laterality: Left | Wound class: Clean

## 2015-01-08 MED ORDER — FENTANYL CITRATE (PF) 100 MCG/2ML IJ SOLN
INTRAMUSCULAR | Status: AC
  Filled 2015-01-08: qty 2

## 2015-01-08 MED ORDER — ACETAMINOPHEN 10 MG/ML IV SOLN
INTRAVENOUS | Status: AC
  Filled 2015-01-08: qty 100

## 2015-01-08 MED ORDER — FAMOTIDINE 20 MG PO TABS
ORAL_TABLET | ORAL | Status: AC
  Administered 2015-01-08: 20 mg via ORAL
  Filled 2015-01-08: qty 1

## 2015-01-08 MED ORDER — BUPIVACAINE-EPINEPHRINE (PF) 0.5% -1:200000 IJ SOLN
INTRAMUSCULAR | Status: AC
  Filled 2015-01-08: qty 30

## 2015-01-08 MED ORDER — DEXAMETHASONE SODIUM PHOSPHATE 4 MG/ML IJ SOLN
INTRAMUSCULAR | Status: DC | PRN
  Administered 2015-01-08: 10 mg via INTRAVENOUS

## 2015-01-08 MED ORDER — PROPOFOL 10 MG/ML IV BOLUS
INTRAVENOUS | Status: DC | PRN
  Administered 2015-01-08: 50 mg via INTRAVENOUS
  Administered 2015-01-08: 100 mg via INTRAVENOUS

## 2015-01-08 MED ORDER — FENTANYL CITRATE (PF) 100 MCG/2ML IJ SOLN
INTRAMUSCULAR | Status: DC | PRN
  Administered 2015-01-08 (×2): 50 ug via INTRAVENOUS

## 2015-01-08 MED ORDER — LIDOCAINE HCL (CARDIAC) 20 MG/ML IV SOLN
INTRAVENOUS | Status: DC | PRN
Start: 2015-01-08 — End: 2015-01-08
  Administered 2015-01-08: 60 mg via INTRAVENOUS

## 2015-01-08 MED ORDER — BUPIVACAINE-EPINEPHRINE (PF) 0.5% -1:200000 IJ SOLN
INTRAMUSCULAR | Status: DC | PRN
  Administered 2015-01-08: 20 mL
  Administered 2015-01-08: 6 mL

## 2015-01-08 MED ORDER — ACETAMINOPHEN 10 MG/ML IV SOLN
1000.0000 mg | Freq: Once | INTRAVENOUS | Status: AC
  Administered 2015-01-08: 1000 mg via INTRAVENOUS

## 2015-01-08 MED ORDER — FAMOTIDINE 20 MG PO TABS
20.0000 mg | ORAL_TABLET | Freq: Once | ORAL | Status: AC
  Administered 2015-01-08: 20 mg via ORAL

## 2015-01-08 MED ORDER — LACTATED RINGERS IV SOLN
INTRAVENOUS | Status: DC
  Administered 2015-01-08: 10:00:00 via INTRAVENOUS

## 2015-01-08 MED ORDER — ONDANSETRON HCL 4 MG/2ML IJ SOLN: 4.0000 mg | Freq: Once | INTRAMUSCULAR | Status: DC | PRN

## 2015-01-08 MED ORDER — FENTANYL CITRATE (PF) 100 MCG/2ML IJ SOLN
25.0000 ug | INTRAMUSCULAR | Status: DC | PRN
  Administered 2015-01-08 (×4): 25 ug via INTRAVENOUS

## 2015-01-08 MED ORDER — ONDANSETRON HCL 4 MG/2ML IJ SOLN
INTRAMUSCULAR | Status: DC | PRN
  Administered 2015-01-08: 4 mg via INTRAVENOUS

## 2015-01-08 SURGICAL SUPPLY — 38 items
BANDAGE ELASTIC 6 CLIP NS LF (GAUZE/BANDAGES/DRESSINGS) ×3 IMPLANT
BENZOIN TINCTURE PRP APPL 2/3 (GAUZE/BANDAGES/DRESSINGS) ×3 IMPLANT
BLADE SURG 15 STRL SS SAFETY (BLADE) ×3 IMPLANT
BNDG GAUZE 4.5X4.1 6PLY STRL (MISCELLANEOUS) ×3 IMPLANT
CANISTER SUCT 1200ML W/VALVE (MISCELLANEOUS) ×3 IMPLANT
CHLORAPREP W/TINT 26ML (MISCELLANEOUS) ×3 IMPLANT
CNTNR SPEC 2.5X3XGRAD LEK (MISCELLANEOUS)
CONT SPEC 4OZ STER OR WHT (MISCELLANEOUS)
CONTAINER SPEC 2.5X3XGRAD LEK (MISCELLANEOUS) IMPLANT
COVER PROBE ULTRASOUND XLONG L (MISCELLANEOUS) ×3 IMPLANT
DEVICE DUBIN SPECIMEN MAMMOGRA (MISCELLANEOUS) ×3 IMPLANT
DRAPE LAPAROTOMY 100X77 ABD (DRAPES) ×3 IMPLANT
DRESSING TELFA 4X3 1S ST N-ADH (GAUZE/BANDAGES/DRESSINGS) ×3 IMPLANT
DRSG TELFA 3X8 NADH (GAUZE/BANDAGES/DRESSINGS) ×3 IMPLANT
ELECT CAUTERY BLADE TIP 2.5 (TIP) ×3
ELECTRODE CAUTERY BLDE TIP 2.5 (TIP) ×2 IMPLANT
GAUZE FLUFF 18X24 1PLY STRL (GAUZE/BANDAGES/DRESSINGS) ×3 IMPLANT
GLOVE BIO SURGEON STRL SZ7.5 (GLOVE) ×9 IMPLANT
GLOVE INDICATOR 8.0 STRL GRN (GLOVE) ×3 IMPLANT
GOWN STRL REUS W/ TWL LRG LVL3 (GOWN DISPOSABLE) ×4 IMPLANT
GOWN STRL REUS W/TWL LRG LVL3 (GOWN DISPOSABLE) ×2
KIT RM TURNOVER STRD PROC AR (KITS) ×3 IMPLANT
LABEL OR SOLS (LABEL) IMPLANT
MARGIN MAP 10MM (MISCELLANEOUS) ×3 IMPLANT
NDL SAFETY 22GX1.5 (NEEDLE) ×3 IMPLANT
NDL SAFETY 25GX1.5 (NEEDLE) ×3 IMPLANT
PACK BASIN MINOR ARMC (MISCELLANEOUS) ×3 IMPLANT
PAD GROUND ADULT SPLIT (MISCELLANEOUS) ×3 IMPLANT
STRIP CLOSURE SKIN 1/2X4 (GAUZE/BANDAGES/DRESSINGS) ×3 IMPLANT
SUT ETHILON 3-0 FS-10 30 BLK (SUTURE) ×3
SUT VIC AB 2-0 CT1 27 (SUTURE) ×4
SUT VIC AB 2-0 CT1 TAPERPNT 27 (SUTURE) ×8 IMPLANT
SUT VIC AB 4-0 FS2 27 (SUTURE) IMPLANT
SUTURE EHLN 3-0 FS-10 30 BLK (SUTURE) ×2 IMPLANT
SWABSTK COMLB BENZOIN TINCTURE (MISCELLANEOUS) ×3 IMPLANT
SYR CONTROL 10ML (SYRINGE) ×3 IMPLANT
TAPE TRANSPORE STRL 2 31045 (GAUZE/BANDAGES/DRESSINGS) IMPLANT
WATER STERILE IRR 1000ML POUR (IV SOLUTION) ×3 IMPLANT

## 2015-01-08 NOTE — Anesthesia Postprocedure Evaluation (Signed)
  Anesthesia Post-op Note  Patient: [name]  Procedure(s) Performed: Procedure(s): BREAST BIOPSY WITH NEEDLE LOCALIZATION (Left) LUMPECTOMY (Left)  Anesthesia type:General  Patient location: PACU  Post pain: Pain level controlled  Post assessment: Post-op Vital signs reviewed, Patient's Cardiovascular Status Stable, Respiratory Function Stable, Patent Airway and No signs of Nausea or vomiting  Post vital signs: Reviewed and stable  Last Vitals:  Filed Vitals:   01/08/15 1230  BP:   Pulse:   Temp: 36.8 C  Resp:     Level of consciousness: awake, alert  and patient cooperative  Complications: No apparent anesthesia complications

## 2015-01-08 NOTE — Anesthesia Procedure Notes (Signed)
Procedure Name: LMA Insertion Date/Time: 01/08/2015 10:30 AM Performed by: Silvana Newness Pre-anesthesia Checklist: Patient identified, Emergency Drugs available, Suction available, Patient being monitored and Timeout performed Patient Re-evaluated:Patient Re-evaluated prior to inductionOxygen Delivery Method: Circle system utilized Preoxygenation: Pre-oxygenation with 100% oxygen Intubation Type: IV induction Ventilation: Mask ventilation without difficulty LMA: LMA inserted LMA Size: 3.0 Number of attempts: 1 Placement Confirmation: positive ETCO2 and breath sounds checked- equal and bilateral Tube secured with: Tape Dental Injury: Teeth and Oropharynx as per pre-operative assessment

## 2015-01-08 NOTE — Transfer of Care (Signed)
Immediate Anesthesia Transfer of Care Note  Patient: [name]  Procedure(s) Performed: Procedure(s): BREAST BIOPSY WITH NEEDLE LOCALIZATION (Left) LUMPECTOMY (Left)  Patient Location: PACU  Anesthesia Type:General  Level of Consciousness: awake, alert , oriented and patient cooperative  Airway & Oxygen Therapy: Patient Spontanous Breathing and Patient connected to face mask oxygen  Post-op Assessment: Report given to RN, Post -op Vital signs reviewed and stable and Patient moving all extremities X 4  Post vital signs: Reviewed and stable  Last Vitals:  Filed Vitals:   01/08/15 1150  BP: 154/70  Pulse: 78  Temp: 36.5 C  Resp: 16    Complications: No apparent anesthesia complications

## 2015-01-08 NOTE — Op Note (Signed)
Preoperative diagnosis: DCIS left breast.  Postoperative diagnosis: Same.  Operative procedure: Wide local excision with needle localization, mastoplasty.  Operating surgeon: Hervey Ard, M.D.  Anesthesia: Gen. by LMA, Marcaine 0.5% with 1-200,000 epinephrine, 30 mL.  Estimated blood loss: 10 mL.  Clinical note: This 74 year old woman had abnormal mammogram and subsequently underwent biopsy 3 of the 10:00 position of the left breast. One of these areas showed a foci of DCIS. She was considered a candidate for wide excision. She underwent needle localization of the most superficial and deep clips with anticipation of removing all of the previous biopsy sites.  Location did not preclude later sentinel node biopsy if invasive cancer is identified and for that reason sentinel node biopsy was not completed today.  Operative note: The patient underwent general anesthesia. The breast was prepped with chlorpropamide day. Ultrasound was used to help confirm the location of the needles to provide wide excision. As the lesions were linearly orientated in the 10:00 position of the left breast was elected to make use of a radial incision. This extended just shy of the areola. Skin and subcutaneous tissue is divided sharply with hemostasis achieved by electrocautery. Prior to incision Marcaine was infiltrated for postoperative analgesia and hemostasis. A 6 x 7 x 4 cm block of tissue encompassing both wires was excised, orientated and sent to radiology. Specimen radiograph confirmed all previous clips within the central portion of the specimen and both wire tips intact. Gross examination by Dr. Luana Shu and pathology showed the surgical site well away from the biopsy sites.  The breast was elevated off the underlying pectoralis muscle taking the fascia with the breast tissue. This was then approximated in a radial fashion with interrupted 2-0 Vicryls sutures to reapproximate the breast volume and contour. The  deep adipose tissue was approximated similar fashion. Skin flaps are elevated 3 cm circumferentially around the wound, 5 mm in thickness. Superficial fat was approximated with 2-0 Vicryls. The skin was closed with a running 4-0 Vicryls subcuticular suture. Benzoin and Steri-Strips followed by Telfa, fluffed gauze, Kerlix and Ace wrap was applied.  The patient tolerated the procedure well and was taken to the recovery room in stable condition.

## 2015-01-08 NOTE — H&P (Signed)
DCIS, for wide excision. Procedure reviewed. No change in cardiopulmonary status.

## 2015-01-08 NOTE — Discharge Instructions (Addendum)
Leave the compressive wrap in place until Thursday, June 16. May remove at that time shower.  Wear a bra day and night after compressive breath is removed.  May wash underarms and use deodorant as desired.  Tylenol 650 mg by mouth every 4 hours while awake for the next 3 days, then as needed for soreness.  Resume your usual dose of Mobic today.  Tramadol as needed for pain.  AMBULATORY SURGERY  DISCHARGE INSTRUCTIONS   1) The drugs that you were given will stay in your system until tomorrow so for the next 24 hours you should not:  A) Drive an automobile B) Make any legal decisions C) Drink any alcoholic beverage   2) You may resume regular meals tomorrow.  Today it is better to start with liquids and gradually work up to solid foods.  You may eat anything you prefer, but it is better to start with liquids, then soup and crackers, and gradually work up to solid foods.   3) Please notify your doctor immediately if you have any unusual bleeding, trouble breathing, redness and pain at the surgery site, drainage, fever, or pain not relieved by medication.

## 2015-01-08 NOTE — Anesthesia Preprocedure Evaluation (Signed)
Anesthesia Evaluation  Patient identified by MRN, date of birth, ID band Patient awake    Reviewed: Allergy & Precautions, NPO status , Patient's Chart, lab work & pertinent test results  Airway Mallampati: I  TM Distance: >3 FB Neck ROM: Limited    Dental  (+) Teeth Intact   Pulmonary former smoker,    Pulmonary exam normal       Cardiovascular Exercise Tolerance: Good Normal cardiovascular exam    Neuro/Psych    GI/Hepatic   Endo/Other    Renal/GU      Musculoskeletal   Abdominal Normal abdominal exam  (+)   Peds  Hematology   Anesthesia Other Findings   Reproductive/Obstetrics                             Anesthesia Physical Anesthesia Plan  ASA: II  Anesthesia Plan: General   Post-op Pain Management:    Induction: Intravenous  Airway Management Planned: LMA  Additional Equipment:   Intra-op Plan:   Post-operative Plan: Extubation in OR  Informed Consent: I have reviewed the patients History and Physical, chart, labs and discussed the procedure including the risks, benefits and alternatives for the proposed anesthesia with the patient or authorized representative who has indicated his/her understanding and acceptance.     Plan Discussed with: CRNA  Anesthesia Plan Comments:         Anesthesia Quick Evaluation

## 2015-01-11 ENCOUNTER — Other Ambulatory Visit: Payer: Self-pay | Admitting: *Deleted

## 2015-01-11 ENCOUNTER — Telehealth: Payer: Self-pay | Admitting: *Deleted

## 2015-01-11 ENCOUNTER — Ambulatory Visit (INDEPENDENT_AMBULATORY_CARE_PROVIDER_SITE_OTHER): Payer: Medicare Other | Admitting: General Surgery

## 2015-01-11 ENCOUNTER — Encounter: Payer: Self-pay | Admitting: General Surgery

## 2015-01-11 VITALS — BP 120/72 | HR 72 | Resp 14 | Ht 64.0 in | Wt 149.0 lb

## 2015-01-11 DIAGNOSIS — C50912 Malignant neoplasm of unspecified site of left female breast: Secondary | ICD-10-CM

## 2015-01-11 DIAGNOSIS — C50919 Malignant neoplasm of unspecified site of unspecified female breast: Secondary | ICD-10-CM

## 2015-01-11 DIAGNOSIS — C50212 Malignant neoplasm of upper-inner quadrant of left female breast: Secondary | ICD-10-CM

## 2015-01-11 LAB — SURGICAL PATHOLOGY

## 2015-01-11 NOTE — Telephone Encounter (Signed)
Called patient to check on her post surgery.  States she is doing well, just a little sore.  States she is meeting with Dr. Bary Castilla this afternoon to follow-up on her final pathology report.  She is to call if she has any questions or needs.

## 2015-01-11 NOTE — Progress Notes (Signed)
Patient ID: [name], female   DOB: [birth year], 74 y.o.   MRN: [id number]  Chief Complaint  Patient presents with  . Follow-up    discuss biopsy results    HPI [name] is a 74 y.o. female here today to discuss breast biopsy results. The patient underwent wide excision of the 3 areas previously biopsied in the upper inner quadrant of the left breast on 01/08/2015. She reports minimal discomfort. HPI  Past Medical History  Diagnosis Date  . 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 Hearing loss   . 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    Past Surgical History  Procedure Laterality Date  . Appendectomy  1950  . Brain surgery  1995    left frontal/temporal  . Colonoscopy  2010    Dr. [name]  . Breast biopsy Left 1997  . 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 Breast biopsy Left 01/08/2015    Procedure: BREAST BIOPSY WITH NEEDLE LOCALIZATION;  Surgeon: [name], MD;  Location: [facility] ORS;  Service: General;  Laterality: Left;  . Breast lumpectomy Left 01/08/2015    Procedure: LUMPECTOMY;  Surgeon: [name], MD;  Location: [facility] ORS;  Service: General;  Laterality: Left;    Family History  Problem Relation Age of Onset  . Breast cancer Sister 13  . Addison's disease Mother   . Stroke Father     Social History History  Substance Use Topics  . Smoking status: Former Smoker    Quit date: 12/28/1964  . Smokeless tobacco: Never Used  . Alcohol Use: 0.6 - 1.2 oz/week    1-2 Glasses of wine per week     Comment: 1 Glass Wine / Night    No Known Allergies  Current Outpatient Prescriptions  Medication Sig Dispense Refill  . Ascorbic Acid (VITAMIN C) 100 MG tablet Take 100 mg by mouth every morning.     . Calcium Carbonate-Vit D-Min (CALTRATE 600+D PLUS MINERALS) 600-800 MG-UNIT TABS Take 1 tablet by mouth every morning.     . calcium-vitamin D 250-100 MG-UNIT per tablet Take 1 tablet by mouth 1 day or 1 dose. 600 mg    . meloxicam (MOBIC) 7.5 MG tablet Take 7.5 mg by mouth every morning.     . traMADol (ULTRAM) 50 MG tablet Take by mouth 3 (three) times daily.     . Triprolidine-Pseudoephedrine (ANTIHISTAMINE PO) Take by mouth every morning.      No current facility-administered medications for this visit.    Review of Systems Review of Systems  Blood pressure 120/72, pulse 72, resp. rate 14, height _0  (1.626 m), weight 149 lb (67.586 kg).  Physical Exam Physical Exam  Constitutional: She appears well-developed.  HENT:  Head: Normocephalic.  Cardiovascular: Normal rate.   Pulmonary/Chest: Effort normal and breath sounds normal.      Data Reviewed Pathology showed a 1.5 cm area of invasive mammary carcinoma in addition to the previously identified high-grade DCIS. The medial margin is close but negative. ER negative, [name], [name].  Assessment    Stage I carcinoma the left breast.  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    Plan    The pathology findings were reviewed. She is a candidate for sentinel node biopsy. While the is less than 2 cm, and assuming the  nodes are negative, with the finding of a ER/[name], HER-2/neu overexpressing tumor she will likely be a candidate for adjuvant chemotherapy. An appointment has been scheduled for her to meet with medical oncology again on June 23. In the interval will plan to complete a sentinel node biopsy on June 21. Original plans were for port placement at the same time, and this may indeed take place if I had an opportunity to meet with the medical oncologist prior to Tuesdays scheduled surgery.  I think the patient's severe right hip pain warrants being addressed prior to any adjuvant chemotherapy. The patient and her husband will review this with Dr. Oliva Bustard at their upcoming appointment.  In the interval, the  patient should continue to wear her bra day and night for support.  The sentinel node procedure was reviewed. Axillary dissection on the of macro metastatic disease is identified.  The risks associated with central venous access includinThis portion of the planned procedure may be put on hold depending on upcoming discussion with medical oncology.g arterial, pulmonary and venous injury were reviewed. The possible need for additional treatment if pulmonary injury occurs (chest tube placement) was discussed.     The majority of the visit was spent reviewing the 5 stability of adjuvant chemotherapy and timing related to her necessary hip replacement.     [name] 01/12/2015, 2:26 PM

## 2015-01-12 ENCOUNTER — Inpatient Hospital Stay: Admission: RE | Admit: 2015-01-12 | Payer: Medicare Other | Source: Ambulatory Visit

## 2015-01-12 ENCOUNTER — Telehealth: Payer: Self-pay | Admitting: General Surgery

## 2015-01-12 ENCOUNTER — Encounter: Payer: Self-pay | Admitting: General Surgery

## 2015-01-12 LAB — CANCER ANTIGEN 27.29: CA 27.29: 27.4 U/mL (ref 0.0–38.6)

## 2015-01-12 LAB — CEA: CEA: 1.1 ng/mL (ref 0.0–4.7)

## 2015-01-12 NOTE — Telephone Encounter (Signed)
Notified laboratory tests completed just today were normal. Follow-up as previously scheduled.

## 2015-01-12 NOTE — H&P (Signed)
Patient ID: [name], female DOB: [birth year], 74 y.o. MRN: [id number]  Chief Complaint   Patient presents with   .  Follow-up     discuss biopsy results    HPI  [name] is a 74 y.o. female here today to discuss breast biopsy results. The patient underwent wide excision of the 3 areas previously biopsied in the upper inner quadrant of the left breast on 01/08/2015. She reports minimal discomfort.  HPI  Past Medical History   Diagnosis  Date   .  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  Hearing loss    .  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    Past Surgical History   Procedure  Laterality  Date   .  Appendectomy   1950   .  Brain surgery   1995     left frontal/temporal   .  Colonoscopy   2010     Dr. [name]   .  Breast biopsy  Left  1997   .  Breast biopsy  Left  12/15/14     confirmed DCIS   .  Breast biopsy  Left  01/08/2015     Procedure: BREAST BIOPSY WITH NEEDLE LOCALIZATION; Surgeon: [name], MD; Location: [facility] ORS; Service: General; Laterality: Left;   .  Breast lumpectomy  Left  01/08/2015     Procedure: LUMPECTOMY; Surgeon: [name], MD; Location: [facility] ORS; Service: General; Laterality: Left;    Family History   Problem  Relation  Age of Onset   .  Breast cancer  Sister  31   .  Addison's disease  Mother    .  Stroke  Father     Social History  History   Substance Use Topics   .  Smoking status:  Former Smoker     Quit date:  12/28/1964   .  Smokeless tobacco:  Never Used   .  Alcohol Use:  0.6 - 1.2 oz/week     1-2 Glasses of wine per week      Comment: 1 Glass Wine / Night    No Known Allergies  Current Outpatient Prescriptions   Medication  Sig  Dispense  Refill   .  Ascorbic Acid (VITAMIN C) 100 MG tablet  Take 100 mg by mouth every morning.     .  Calcium Carbonate-Vit D-Min (CALTRATE 600+D PLUS  MINERALS) 600-800 MG-UNIT TABS  Take 1 tablet by mouth every morning.     .  calcium-vitamin D 250-100 MG-UNIT per tablet  Take 1 tablet by mouth 1 day or 1 dose. 600 mg     .  meloxicam (MOBIC) 7.5 MG tablet  Take 7.5 mg by mouth every morning.     .  traMADol (ULTRAM) 50 MG tablet  Take by mouth 3 (three) times daily.     .  Triprolidine-Pseudoephedrine (ANTIHISTAMINE PO)  Take by mouth every morning.      No current facility-administered medications for this visit.    Review of Systems  Review of Systems  Blood pressure 120/72, pulse 72, resp. rate 14, height 5' 4"  (1.626 m), weight 149 lb (67.586 kg).  Physical Exam  Physical Exam  Constitutional: She appears well-developed.  HENT:  Head: Normocephalic.  Cardiovascular: Normal rate.  Pulmonary/Chest: Effort normal and breath sounds normal.  Data Reviewed  Pathology showed a 1.5 cm area of invasive mammary carcinoma in addition to the previously identified high-grade DCIS. The medial margin is close but negative. ER negative, [name], [name].  Assessment   Stage I carcinoma the left breast.  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   Plan   The pathology findings were reviewed. She is a candidate for sentinel node biopsy. While the is less than 2 cm, and assuming the nodes are negative, with the finding of a ER/[name], HER-2/neu overexpressing tumor she will likely be a candidate for adjuvant chemotherapy. An appointment has been scheduled for her to meet with medical oncology again on June 23. In the interval will plan to complete a sentinel node biopsy on June 21. Original plans were for port placement at the same time, and this may indeed take place if I had an opportunity to meet with the medical oncologist prior to Tuesdays scheduled surgery.  I think the patient's severe right hip pain warrants being addressed prior to any adjuvant chemotherapy. The patient and her husband will review this  with Dr. Oliva Bustard at their upcoming appointment.  In the interval, the patient should continue to wear her bra day and night for support.  The sentinel node procedure was reviewed. Axillary dissection on the of macro metastatic disease is identified.  The risks associated with central venous access includinThis portion of the planned procedure may be put on hold depending on upcoming discussion with medical oncology.g arterial, pulmonary and venous injury were reviewed. The possible need for additional treatment if pulmonary injury occurs (chest tube placement) was discussed.   [name]  01/12/2015, 2:26 PM

## 2015-01-15 ENCOUNTER — Telehealth: Payer: Self-pay | Admitting: General Surgery

## 2015-01-15 ENCOUNTER — Encounter: Payer: Self-pay | Admitting: *Deleted

## 2015-01-15 DIAGNOSIS — Z9889 Other specified postprocedural states: Secondary | ICD-10-CM | POA: Diagnosis not present

## 2015-01-15 DIAGNOSIS — Z803 Family history of malignant neoplasm of breast: Secondary | ICD-10-CM | POA: Diagnosis not present

## 2015-01-15 DIAGNOSIS — C779 Secondary and unspecified malignant neoplasm of lymph node, unspecified: Secondary | ICD-10-CM | POA: Diagnosis not present

## 2015-01-15 DIAGNOSIS — Z171 Estrogen receptor negative status [ER-]: Secondary | ICD-10-CM | POA: Diagnosis not present

## 2015-01-15 DIAGNOSIS — Z79899 Other long term (current) drug therapy: Secondary | ICD-10-CM | POA: Diagnosis not present

## 2015-01-15 DIAGNOSIS — Z86011 Personal history of benign neoplasm of the brain: Secondary | ICD-10-CM | POA: Diagnosis not present

## 2015-01-15 DIAGNOSIS — C50912 Malignant neoplasm of unspecified site of left female breast: Secondary | ICD-10-CM | POA: Diagnosis not present

## 2015-01-15 DIAGNOSIS — H9190 Unspecified hearing loss, unspecified ear: Secondary | ICD-10-CM | POA: Diagnosis not present

## 2015-01-15 DIAGNOSIS — Z823 Family history of stroke: Secondary | ICD-10-CM | POA: Diagnosis not present

## 2015-01-15 DIAGNOSIS — M1611 Unilateral primary osteoarthritis, right hip: Secondary | ICD-10-CM | POA: Diagnosis not present

## 2015-01-15 DIAGNOSIS — Z8489 Family history of other specified conditions: Secondary | ICD-10-CM | POA: Diagnosis not present

## 2015-01-15 DIAGNOSIS — Z87891 Personal history of nicotine dependence: Secondary | ICD-10-CM | POA: Diagnosis not present

## 2015-01-15 DIAGNOSIS — Z808 Family history of malignant neoplasm of other organs or systems: Secondary | ICD-10-CM | POA: Diagnosis not present

## 2015-01-15 NOTE — Telephone Encounter (Signed)
Message left that I had spoken with Dr. Oliva Bustard. He agrees that adjuvant chemotherapy is appropriate in light of the HER-2/neu status. We'll plan to place a port tomorrow, and anticipate early repair of the right hip followed by initiation of chemotherapy to follow.

## 2015-01-15 NOTE — Patient Instructions (Signed)
  Your procedure is scheduled on:01/16/15 Report to Radiology 8:30 am To find out your arrival time please call [phone] between 1PM - 3PM on  Remember: Instructions that are not followed completely may result in serious medical risk, up to and including death, or upon the discretion of your surgeon and anesthesiologist your surgery may need to be rescheduled.    _x__ 1. Do not eat food or drink liquids after midnight. No gum chewing or hard candies.     _x___ 2. No Alcohol for 24 hours before or after surgery.   ____ 3. Bring all medications with you on the day of surgery if instructed.    _x___ 4. Notify your doctor if there is any change in your medical condition     (cold, fever, infections).     Do not wear jewelry, make-up, hairpins, clips or nail polish.  Do not wear lotions, powders, or perfumes. You may wear deodorant.  Do not shave 48 hours prior to surgery. Men may shave face and neck.  Do not bring valuables to the hospital.    [facility] is not responsible for any belongings or valuables.               Contacts, dentures or bridgework may not be worn into surgery.  Leave your suitcase in the car. After surgery it may be brought to your room.  For patients admitted to the hospital, discharge time is determined by your                treatment team.   Patients discharged the day of surgery will not be allowed to drive home.   Please read over the following fact sheets that you were given:   Surgical Site Infection Prevention   ____ Take these medicines the morning of surgery with A SIP OF WATER:    1.   2.   3.   4.  5.  6.  ____ Fleet Enema (as directed)   ____ Use CHG Soap as directed  ____ Use inhalers on the day of surgery  ____ Stop metformin 2 days prior to surgery    ____ Take 1/2 of usual insulin dose the night before surgery and none on the morning of surgery.   ____ Stop Coumadin/Plavix/aspirin on  ____ Stop Anti-inflammatories on   ____  Stop supplements until after surgery.    ____ Bring C-Pap to the hospital.

## 2015-01-16 ENCOUNTER — Ambulatory Visit: Payer: Medicare Other

## 2015-01-16 ENCOUNTER — Ambulatory Visit: Payer: Medicare Other | Admitting: General Surgery

## 2015-01-16 ENCOUNTER — Ambulatory Visit
Admission: RE | Admit: 2015-01-16 | Discharge: 2015-01-16 | Disposition: A | Discharge status: Home / Self Care | Payer: Medicare Other | Source: Ambulatory Visit | Attending: General Surgery | Admitting: General Surgery

## 2015-01-16 ENCOUNTER — Ambulatory Visit: Payer: Medicare Other | Admitting: Anesthesiology

## 2015-01-16 ENCOUNTER — Encounter: Payer: Self-pay | Admitting: *Deleted

## 2015-01-16 ENCOUNTER — Encounter
Admission: RE | Disposition: A | Discharge status: Home / Self Care | Payer: Self-pay | Source: Ambulatory Visit | Attending: General Surgery

## 2015-01-16 DIAGNOSIS — Z8489 Family history of other specified conditions: Secondary | ICD-10-CM | POA: Insufficient documentation

## 2015-01-16 DIAGNOSIS — Z823 Family history of stroke: Secondary | ICD-10-CM | POA: Insufficient documentation

## 2015-01-16 DIAGNOSIS — C50212 Malignant neoplasm of upper-inner quadrant of left female breast: Secondary | ICD-10-CM

## 2015-01-16 DIAGNOSIS — Z86011 Personal history of benign neoplasm of the brain: Secondary | ICD-10-CM | POA: Insufficient documentation

## 2015-01-16 DIAGNOSIS — Z79899 Other long term (current) drug therapy: Secondary | ICD-10-CM | POA: Insufficient documentation

## 2015-01-16 DIAGNOSIS — C50919 Malignant neoplasm of unspecified site of unspecified female breast: Secondary | ICD-10-CM

## 2015-01-16 DIAGNOSIS — M1611 Unilateral primary osteoarthritis, right hip: Secondary | ICD-10-CM | POA: Insufficient documentation

## 2015-01-16 DIAGNOSIS — Z9889 Other specified postprocedural states: Secondary | ICD-10-CM | POA: Insufficient documentation

## 2015-01-16 DIAGNOSIS — C50912 Malignant neoplasm of unspecified site of left female breast: Secondary | ICD-10-CM | POA: Diagnosis not present

## 2015-01-16 DIAGNOSIS — H9190 Unspecified hearing loss, unspecified ear: Secondary | ICD-10-CM | POA: Insufficient documentation

## 2015-01-16 DIAGNOSIS — Z171 Estrogen receptor negative status [ER-]: Secondary | ICD-10-CM | POA: Insufficient documentation

## 2015-01-16 DIAGNOSIS — C779 Secondary and unspecified malignant neoplasm of lymph node, unspecified: Secondary | ICD-10-CM | POA: Insufficient documentation

## 2015-01-16 DIAGNOSIS — Z808 Family history of malignant neoplasm of other organs or systems: Secondary | ICD-10-CM | POA: Insufficient documentation

## 2015-01-16 DIAGNOSIS — Z87891 Personal history of nicotine dependence: Secondary | ICD-10-CM | POA: Insufficient documentation

## 2015-01-16 DIAGNOSIS — Z803 Family history of malignant neoplasm of breast: Secondary | ICD-10-CM | POA: Insufficient documentation

## 2015-01-16 HISTORY — PX: PORTACATH PLACEMENT: SHX2246

## 2015-01-16 HISTORY — DX: Other amnesia: R41.3

## 2015-01-16 HISTORY — PX: SENTINEL NODE BIOPSY: SHX6608

## 2015-01-16 SURGERY — BIOPSY, LYMPH NODE, SENTINEL
Anesthesia: General | Site: Chest | Laterality: Right | Wound class: Clean

## 2015-01-16 MED ORDER — LACTATED RINGERS IV SOLN
INTRAVENOUS | Status: DC
  Administered 2015-01-16: 10:00:00 via INTRAVENOUS

## 2015-01-16 MED ORDER — ONDANSETRON HCL 4 MG/2ML IJ SOLN: 4.0000 mg | Freq: Once | INTRAMUSCULAR | Status: DC | PRN

## 2015-01-16 MED ORDER — BUPIVACAINE HCL (PF) 0.5 % IJ SOLN
INTRAMUSCULAR | Status: AC
  Filled 2015-01-16: qty 30

## 2015-01-16 MED ORDER — SODIUM CHLORIDE 0.9 % IJ SOLN
INTRAMUSCULAR | Status: AC
  Filled 2015-01-16: qty 50

## 2015-01-16 MED ORDER — METHYLENE BLUE 1 % INJ SOLN
INTRAMUSCULAR | Status: AC
  Filled 2015-01-16: qty 10

## 2015-01-16 MED ORDER — BUPIVACAINE-EPINEPHRINE (PF) 0.5% -1:200000 IJ SOLN
INTRAMUSCULAR | Status: AC
  Filled 2015-01-16: qty 30

## 2015-01-16 MED ORDER — CEFAZOLIN SODIUM-DEXTROSE 2-3 GM-% IV SOLR
2.0000 g | INTRAVENOUS | Status: AC
  Administered 2015-01-16: 2 g via INTRAVENOUS

## 2015-01-16 MED ORDER — FAMOTIDINE 20 MG PO TABS
ORAL_TABLET | ORAL | Status: AC
  Administered 2015-01-16: 20 mg via ORAL
  Filled 2015-01-16: qty 1

## 2015-01-16 MED ORDER — LIDOCAINE HCL (PF) 1 % IJ SOLN
INTRAMUSCULAR | Status: DC | PRN
  Administered 2015-01-16: 20 mL via SUBCUTANEOUS

## 2015-01-16 MED ORDER — FAMOTIDINE 20 MG PO TABS
20.0000 mg | ORAL_TABLET | Freq: Once | ORAL | Status: AC
  Administered 2015-01-16: 20 mg via ORAL

## 2015-01-16 MED ORDER — TECHNETIUM TC 99M SULFUR COLLOID
1.0600 | Freq: Once | INTRAVENOUS | Status: AC | PRN
  Administered 2015-01-16: 1.06 via INTRAVENOUS

## 2015-01-16 MED ORDER — ONDANSETRON HCL 4 MG/2ML IJ SOLN
INTRAMUSCULAR | Status: DC | PRN
Start: 2015-01-16 — End: 2015-01-16
  Administered 2015-01-16: 4 mg via INTRAVENOUS

## 2015-01-16 MED ORDER — FENTANYL CITRATE (PF) 100 MCG/2ML IJ SOLN
INTRAMUSCULAR | Status: DC | PRN
  Administered 2015-01-16 (×2): 50 ug via INTRAVENOUS

## 2015-01-16 MED ORDER — PROPOFOL 10 MG/ML IV BOLUS
INTRAVENOUS | Status: DC | PRN
  Administered 2015-01-16: 50 mg via INTRAVENOUS
  Administered 2015-01-16: 100 mg via INTRAVENOUS

## 2015-01-16 MED ORDER — METHYLENE BLUE 1 % INJ SOLN
INTRAMUSCULAR | Status: DC | PRN
  Administered 2015-01-16: 1.5 mL via INTRADERMAL

## 2015-01-16 MED ORDER — CEFAZOLIN SODIUM-DEXTROSE 2-3 GM-% IV SOLR
INTRAVENOUS | Status: AC
  Administered 2015-01-16: 2 g via INTRAVENOUS
  Filled 2015-01-16: qty 50

## 2015-01-16 MED ORDER — SODIUM CHLORIDE 0.9 % IJ SOLN
INTRAMUSCULAR | Status: DC | PRN
  Administered 2015-01-16: 10 mL

## 2015-01-16 MED ORDER — TRAMADOL HCL 50 MG PO TABS
ORAL_TABLET | ORAL | Status: AC
  Administered 2015-01-16: 100 mg via ORAL
  Filled 2015-01-16: qty 2

## 2015-01-16 MED ORDER — MIDAZOLAM HCL 2 MG/2ML IJ SOLN
INTRAMUSCULAR | Status: DC | PRN
  Administered 2015-01-16: 1 mg via INTRAVENOUS

## 2015-01-16 MED ORDER — SODIUM CHLORIDE 0.9 % IJ SOLN
INTRAMUSCULAR | Status: AC
  Filled 2015-01-16: qty 10

## 2015-01-16 MED ORDER — DEXAMETHASONE SODIUM PHOSPHATE 4 MG/ML IJ SOLN
INTRAMUSCULAR | Status: DC | PRN
  Administered 2015-01-16: 10 mg via INTRAVENOUS

## 2015-01-16 MED ORDER — FENTANYL CITRATE (PF) 100 MCG/2ML IJ SOLN
25.0000 ug | INTRAMUSCULAR | Status: DC | PRN
  Administered 2015-01-16 (×2): 25 ug via INTRAVENOUS

## 2015-01-16 MED ORDER — LIDOCAINE HCL (CARDIAC) 20 MG/ML IV SOLN
INTRAVENOUS | Status: DC | PRN
  Administered 2015-01-16: 60 mg via INTRAVENOUS

## 2015-01-16 MED ORDER — LIDOCAINE HCL (PF) 1 % IJ SOLN
INTRAMUSCULAR | Status: AC
  Filled 2015-01-16: qty 30

## 2015-01-16 MED ORDER — FENTANYL CITRATE (PF) 100 MCG/2ML IJ SOLN
INTRAMUSCULAR | Status: AC
  Filled 2015-01-16: qty 2

## 2015-01-16 MED ORDER — TRAMADOL HCL 50 MG PO TABS
100.0000 mg | ORAL_TABLET | Freq: Four times a day (QID) | ORAL | Status: DC | PRN
  Administered 2015-01-16: 100 mg via ORAL

## 2015-01-16 SURGICAL SUPPLY — 40 items
BLADE SURG 15 STRL SS SAFETY (BLADE) ×3 IMPLANT
CHLORAPREP W/TINT 26ML (MISCELLANEOUS) ×3 IMPLANT
CNTNR SPEC 2.5X3XGRAD LEK (MISCELLANEOUS) ×4
CONT SPEC 4OZ STER OR WHT (MISCELLANEOUS) ×2
CONTAINER SPEC 2.5X3XGRAD LEK (MISCELLANEOUS) ×4 IMPLANT
COVER LIGHT HANDLE STERIS (MISCELLANEOUS) ×6 IMPLANT
COVER MAYO STAND STRL (DRAPES) ×3 IMPLANT
COVER PROBE FLX POLY STRL (MISCELLANEOUS) IMPLANT
DECANTER SPIKE VIAL GLASS SM (MISCELLANEOUS) ×6 IMPLANT
DRAPE C-ARM XRAY 36X54 (DRAPES) ×3 IMPLANT
DRAPE LAPAROTOMY TRNSV 106X77 (MISCELLANEOUS) ×3 IMPLANT
DRESSING TELFA 4X3 1S ST N-ADH (GAUZE/BANDAGES/DRESSINGS) ×9 IMPLANT
DRSG TEGADERM 2-3/8X2-3/4 SM (GAUZE/BANDAGES/DRESSINGS) ×3 IMPLANT
DRSG TEGADERM 2X2.25 PEDS (GAUZE/BANDAGES/DRESSINGS) ×3 IMPLANT
DRSG TEGADERM 4X4.75 (GAUZE/BANDAGES/DRESSINGS) ×6 IMPLANT
GLOVE BIO SURGEON STRL SZ7.5 (GLOVE) ×3 IMPLANT
GLOVE INDICATOR 8.0 STRL GRN (GLOVE) ×3 IMPLANT
GOWN STRL REUS W/ TWL LRG LVL3 (GOWN DISPOSABLE) ×4 IMPLANT
GOWN STRL REUS W/TWL LRG LVL3 (GOWN DISPOSABLE) ×2
KIT RM TURNOVER STRD PROC AR (KITS) ×3 IMPLANT
LABEL OR SOLS (LABEL) ×3 IMPLANT
NDL SAFETY 18GX1.5 (NEEDLE) ×3 IMPLANT
NDL SAFETY 22GX1.5 (NEEDLE) ×3 IMPLANT
NS IRRIG 500ML POUR BTL (IV SOLUTION) ×3 IMPLANT
PACK PORT-A-CATH (MISCELLANEOUS) ×3 IMPLANT
PAD GROUND ADULT SPLIT (MISCELLANEOUS) ×3 IMPLANT
PORTACATH POWER 8F (Port) ×3 IMPLANT
SLEVE PROBE SENORX GAMMA FIND (MISCELLANEOUS) ×3 IMPLANT
STRIP CLOSURE SKIN 1/2X4 (GAUZE/BANDAGES/DRESSINGS) ×6 IMPLANT
SUT PROLENE 3 0 SH DA (SUTURE) ×3 IMPLANT
SUT VIC AB 2-0 SH 27 (SUTURE) ×1
SUT VIC AB 2-0 SH 27XBRD (SUTURE) ×2 IMPLANT
SUT VIC AB 3-0 SH 27 (SUTURE) ×1
SUT VIC AB 3-0 SH 27X BRD (SUTURE) ×2 IMPLANT
SUT VIC AB 4-0 FS2 27 (SUTURE) ×3 IMPLANT
SUT VIC AB 4-0 SH 27 (SUTURE) ×1
SUT VIC AB 4-0 SH 27XANBCTRL (SUTURE) ×2 IMPLANT
SWABSTK COMLB BENZOIN TINCTURE (MISCELLANEOUS) ×6 IMPLANT
SYR CONTROL 10ML (SYRINGE) ×6 IMPLANT
SYRINGE 10CC LL (SYRINGE) ×3 IMPLANT

## 2015-01-16 NOTE — Op Note (Signed)
Preoperative diagnosis: Left breast cancer, need for adjuvant chemotherapy  Postoperative diagnosis: Same.  Operative procedure: 1) right subclavian PowerPort placement with ultrasound and fluoroscopic guidance. 2) left axillary sentinel node biopsy.  Operating surgeon: Ollen Bowl, M.D.  Anesthesia: Gen. by LMA.  Estimated blood lost 10 mL.  Clinical note: This 74 year old woman had original biopsy showing evidence of high-grade DCIS. Subsequent wide excision showed a less than 2 cm area of invasive mammary carcinoma ER negative, [name], HER-2 positive. She is a candidate for sentinel node biopsy and PowerPort placement with anticipated plans regimen chemotherapy.  Operative note: The patient underwent general anesthesia without difficulty. She received Kefzol intravenously for antibody prophylaxis. SCD stockings for DVT prevention. The right subclavian vein was confirmed patent by ultrasound. This was cannulated after the injection of local and a static. Guidewire was advanced and the dilator placed. The catheter was positioned at the junction of the right atrium and SVC making use of fluoroscopy. The catheter was tunneled to a pocket on the right anterior chest. The port was anchored to the fascia with interrupted 30 proline sutures. The wound was closed with 3-0 Vicryls to the adipose layer and running 4-0 Vicryls to the skin. Telfa and Tegaderm dressing applied.  Attention was turned to the left axilla. Prior to the skin preparation 3 mL of normal saline mixed to 1 with methylene blue was injected in the subareolar plexus of the left breast. She previously been injected with technetium sulfur colloid. Scanning with the gamma finder showed increased uptake in the left axilla. Local anesthesia was infiltrated and a transverse incision made at the inferior aspect of the axillary and below. The skin was incised sharply. Hemostasis was electrocautery 3-0 Vicryl ties. One hot blue node and 2  hot non-blue nodes were identified. Report from pathology was negative for macromastia metastatic disease. The wound was irrigated and the deep fascia closed with running 2-0 Vicryls. The skin was closed with running 4-0 Vicryls. Benzoin Steri-Strips were applied.  The patient was transported to the recovery room stable condition. Correct chest x-ray showed the catheter tip as described above and no evidence of pneumothorax.

## 2015-01-16 NOTE — H&P (Signed)
Medical oncology has been contacted and agrees with recommendation for adjuvant chemotherapy. We'll plan to place a PowerPort today. Indication personal node biopsy reviewed.  No change in cardiopulmonary status.  4 right subclavian PowerPort and left axillary sentinel node biopsy today.

## 2015-01-16 NOTE — Transfer of Care (Signed)
Immediate Anesthesia Transfer of Care Note  Patient: [name]  Procedure(s) Performed: Procedure(s): SENTINEL NODE BIOPSY (Left) INSERTION PORT-A-CATH (Right)  Patient Location: PACU  Anesthesia Type:General  Level of Consciousness: awake, alert , oriented and patient cooperative  Airway & Oxygen Therapy: Patient Spontanous Breathing and Patient connected to face mask oxygen  Post-op Assessment: Report given to RN, Post -op Vital signs reviewed and stable and Patient moving all extremities X 4  Post vital signs: Reviewed and stable  Last Vitals:  Filed Vitals:   01/16/15 1153  BP: 152/77  Pulse: 73  Temp: 36.5 C  Resp: 28    Complications: No apparent anesthesia complications

## 2015-01-16 NOTE — Discharge Instructions (Signed)
Apply ice to area intermittently for comfort. Tylenol: If needed for soreness. Tramadol: If needed for pain.  OK to shower. Avoid repetitive activities with your arms for the next few days. OK to shower at any time.  May remove outer plastic dressings in three days if desired.   General Anesthesia, Care After Refer to this sheet in the next few weeks. These instructions provide you with information on caring for yourself after your procedure. Your health care provider may also give you more specific instructions. Your treatment has been planned according to current medical practices, but problems sometimes occur. Call your health care provider if you have any problems or questions after your procedure. WHAT TO EXPECT AFTER THE PROCEDURE After the procedure, it is typical to experience:  Sleepiness.  Nausea and vomiting. HOME CARE INSTRUCTIONS  For the first 24 hours after general anesthesia:  Have a responsible person with you.  Do not drive a car. If you are alone, do not take public transportation.  Do not drink alcohol.  Do not take medicine that has not been prescribed by your health care provider.  Do not sign important papers or make important decisions.  You may resume a normal diet and activities as directed by your health care provider.  Change bandages (dressings) as directed.  If you have questions or problems that seem related to general anesthesia, call the hospital and ask for the anesthetist or anesthesiologist on call. SEEK MEDICAL CARE IF:  You have nausea and vomiting that continue the day after anesthesia.  You develop a rash. SEEK IMMEDIATE MEDICAL CARE IF:   You have difficulty breathing.  You have chest pain.  You have any allergic problems. Document Released: 10/20/2000 Document Revised: 07/19/2013 Document Reviewed: 01/27/2013 [facility] Patient Information 2015 [place], [place]. This information is not intended to replace advice given to you by  your health care provider. Make sure you discuss any questions you have with your health care provider.

## 2015-01-16 NOTE — Anesthesia Preprocedure Evaluation (Addendum)
Anesthesia Evaluation  Patient identified by MRN, date of birth, ID band Patient awake    Reviewed: Allergy & Precautions, NPO status , Patient's Chart, lab work & pertinent test results  History of Anesthesia Complications Negative for: history of anesthetic complications  Airway Mallampati: II  TM Distance: >3 FB Neck ROM: Full    Dental no notable dental hx.    Pulmonary neg pulmonary ROS, former smoker,  breath sounds clear to auscultation  Pulmonary exam normal       Cardiovascular Exercise Tolerance: Poor negative cardio ROS Normal cardiovascular examRhythm:Regular Rate:Normal     Neuro/Psych Memory impairment negative psych ROS   GI/Hepatic negative GI ROS, Neg liver ROS,   Endo/Other  negative endocrine ROS  Renal/GU negative Renal ROS  negative genitourinary   Musculoskeletal  (+) Arthritis -, Osteoarthritis,    Abdominal   Peds negative pediatric ROS (+)  Hematology negative hematology ROS (+)   Anesthesia Other Findings Breast cancer  Reproductive/Obstetrics negative OB ROS                            Anesthesia Physical Anesthesia Plan  ASA: III  Anesthesia Plan: General   Post-op Pain Management:    Induction: Intravenous  Airway Management Planned: LMA  Additional Equipment:   Intra-op Plan:   Post-operative Plan: Extubation in OR  Informed Consent: I have reviewed the patients History and Physical, chart, labs and discussed the procedure including the risks, benefits and alternatives for the proposed anesthesia with the patient or authorized representative who has indicated his/her understanding and acceptance.   Dental advisory given  Plan Discussed with: CRNA and Surgeon  Anesthesia Plan Comments:         Anesthesia Quick Evaluation

## 2015-01-16 NOTE — Anesthesia Postprocedure Evaluation (Signed)
  Anesthesia Post-op Note  Patient: [name]  Procedure(s) Performed: Procedure(s): SENTINEL NODE BIOPSY (Left) INSERTION PORT-A-CATH (Right)  Anesthesia type:General  Patient location: PACU  Post pain: Pain level controlled  Post assessment: Post-op Vital signs reviewed, Patient's Cardiovascular Status Stable, Respiratory Function Stable, Patent Airway and No signs of Nausea or vomiting  Post vital signs: Reviewed and stable  Last Vitals:  Filed Vitals:   01/16/15 1233  BP:   Pulse: 68  Temp:   Resp: 26    Level of consciousness: awake, alert  and patient cooperative  Complications: No apparent anesthesia complications

## 2015-01-16 NOTE — Anesthesia Procedure Notes (Signed)
Procedure Name: LMA Insertion Date/Time: 01/16/2015 10:22 AM Performed by: Silvana Newness Pre-anesthesia Checklist: Patient identified, Emergency Drugs available, Suction available, Patient being monitored and Timeout performed Patient Re-evaluated:Patient Re-evaluated prior to inductionOxygen Delivery Method: Circle system utilized Preoxygenation: Pre-oxygenation with 100% oxygen Intubation Type: IV induction Ventilation: Mask ventilation without difficulty LMA: LMA inserted LMA Size: 3.5 Number of attempts: 1 Placement Confirmation: positive ETCO2 and breath sounds checked- equal and bilateral Tube secured with: Tape Dental Injury: Teeth and Oropharynx as per pre-operative assessment

## 2015-01-17 LAB — SURGICAL PATHOLOGY

## 2015-01-18 ENCOUNTER — Inpatient Hospital Stay (HOSPITAL_BASED_OUTPATIENT_CLINIC_OR_DEPARTMENT_OTHER): Payer: Medicare Other | Admitting: Oncology

## 2015-01-18 VITALS — BP 122/76 | HR 71 | Temp 97.2°F | Wt 144.4 lb

## 2015-01-18 DIAGNOSIS — C779 Secondary and unspecified malignant neoplasm of lymph node, unspecified: Secondary | ICD-10-CM | POA: Diagnosis not present

## 2015-01-18 DIAGNOSIS — Z171 Estrogen receptor negative status [ER-]: Secondary | ICD-10-CM | POA: Diagnosis not present

## 2015-01-18 DIAGNOSIS — C50912 Malignant neoplasm of unspecified site of left female breast: Secondary | ICD-10-CM | POA: Diagnosis not present

## 2015-01-18 DIAGNOSIS — Z79899 Other long term (current) drug therapy: Secondary | ICD-10-CM

## 2015-01-18 DIAGNOSIS — D0512 Intraductal carcinoma in situ of left breast: Secondary | ICD-10-CM | POA: Diagnosis not present

## 2015-01-18 DIAGNOSIS — M25551 Pain in right hip: Secondary | ICD-10-CM

## 2015-01-18 NOTE — Progress Notes (Signed)
Patient does not have living will.  Former smoker. 

## 2015-01-19 ENCOUNTER — Other Ambulatory Visit: Payer: Self-pay | Admitting: *Deleted

## 2015-01-19 DIAGNOSIS — I427 Cardiomyopathy due to drug and external agent: Secondary | ICD-10-CM

## 2015-01-19 DIAGNOSIS — C50912 Malignant neoplasm of unspecified site of left female breast: Secondary | ICD-10-CM

## 2015-01-19 DIAGNOSIS — T451X5A Adverse effect of antineoplastic and immunosuppressive drugs, initial encounter: Secondary | ICD-10-CM

## 2015-01-19 NOTE — Patient Instructions (Signed)
Trastuzumab injection for infusion What is this medicine? TRASTUZUMAB (tras TOO zoo mab) is a monoclonal antibody. It targets a protein called HER2. This protein is found in some stomach and breast cancers. This medicine can stop cancer cell growth. This medicine may be used with other cancer treatments. This medicine may be used for other purposes; ask your health care provider or pharmacist if you have questions. COMMON BRAND NAME(S): Herceptin What should I tell my health care provider before I take this medicine? They need to know if you have any of these conditions: -heart disease -heart failure -infection (especially a virus infection such as chickenpox, cold sores, or herpes) -lung or breathing disease, like asthma -recent or ongoing radiation therapy -an unusual or allergic reaction to trastuzumab, benzyl alcohol, or other medications, foods, dyes, or preservatives -pregnant or trying to get pregnant -breast-feeding How should I use this medicine? This drug is given as an infusion into a vein. It is administered in a hospital or clinic by a specially trained health care professional. Talk to your pediatrician regarding the use of this medicine in children. This medicine is not approved for use in children. Overdosage: If you think you have taken too much of this medicine contact a poison control center or emergency room at once. NOTE: This medicine is only for you. Do not share this medicine with others. What if I miss a dose? It is important not to miss a dose. Call your doctor or health care professional if you are unable to keep an appointment. What may interact with this medicine? -cyclophosphamide -doxorubicin -warfarin This list may not describe all possible interactions. Give your health care provider a list of all the medicines, herbs, non-prescription drugs, or dietary supplements you use. Also tell them if you smoke, drink alcohol, or use illegal drugs. Some items may  interact with your medicine. What should I watch for while using this medicine? Visit your doctor for checks on your progress. Report any side effects. Continue your course of treatment even though you feel ill unless your doctor tells you to stop. Call your doctor or health care professional for advice if you get a fever, chills or sore throat, or other symptoms of a cold or flu. Do not treat yourself. Try to avoid being around people who are sick. You may experience fever, chills and shaking during your first infusion. These effects are usually mild and can be treated with other medicines. Report any side effects during the infusion to your health care professional. Fever and chills usually do not happen with later infusions. What side effects may I notice from receiving this medicine? Side effects that you should report to your doctor or other health care professional as soon as possible: -breathing difficulties -chest pain or palpitations -cough -dizziness or fainting -fever or chills, sore throat -skin rash, itching or hives -swelling of the legs or ankles -unusually weak or tired Side effects that usually do not require medical attention (report to your doctor or other health care professional if they continue or are bothersome): -loss of appetite -headache -muscle aches -nausea This list may not describe all possible side effects. Call your doctor for medical advice about side effects. You may report side effects to FDA at 1-800-FDA-1088. Where should I keep my medicine? This drug is given in a hospital or clinic and will not be stored at home. NOTE: This sheet is a summary. It may not cover all possible information. If you have questions about this medicine, talk   to your doctor, pharmacist, or health care provider.  2015, Elsevier/Gold Standard. (2009-05-18 13:43:15) Docetaxel injection What is this medicine? DOCETAXEL (doe se TAX el) is a chemotherapy drug. It targets fast  dividing cells, like cancer cells, and causes these cells to die. This medicine is used to treat many types of cancers like breast cancer, certain stomach cancers, head and neck cancer, lung cancer, and prostate cancer. This medicine may be used for other purposes; ask your health care provider or pharmacist if you have questions. COMMON BRAND NAME(S): Docefrez, Taxotere What should I tell my health care provider before I take this medicine? They need to know if you have any of these conditions: -infection (especially a virus infection such as chickenpox, cold sores, or herpes) -liver disease -low blood counts, like low white cell, platelet, or red cell counts -an unusual or allergic reaction to docetaxel, polysorbate 80, other chemotherapy agents, other medicines, foods, dyes, or preservatives -pregnant or trying to get pregnant -breast-feeding How should I use this medicine? This drug is given as an infusion into a vein. It is administered in a hospital or clinic by a specially trained health care professional. Talk to your pediatrician regarding the use of this medicine in children. Special care may be needed. Overdosage: If you think you have taken too much of this medicine contact a poison control center or emergency room at once. NOTE: This medicine is only for you. Do not share this medicine with others. What if I miss a dose? It is important not to miss your dose. Call your doctor or health care professional if you are unable to keep an appointment. What may interact with this medicine? -cyclosporine -erythromycin -ketoconazole -medicines to increase blood counts like filgrastim, pegfilgrastim, sargramostim -vaccines Talk to your doctor or health care professional before taking any of these medicines: -acetaminophen -aspirin -ibuprofen -ketoprofen -naproxen This list may not describe all possible interactions. Give your health care provider a list of all the medicines, herbs,  non-prescription drugs, or dietary supplements you use. Also tell them if you smoke, drink alcohol, or use illegal drugs. Some items may interact with your medicine. What should I watch for while using this medicine? Your condition will be monitored carefully while you are receiving this medicine. You will need important blood work done while you are taking this medicine. This drug may make you feel generally unwell. This is not uncommon, as chemotherapy can affect healthy cells as well as cancer cells. Report any side effects. Continue your course of treatment even though you feel ill unless your doctor tells you to stop. In some cases, you may be given additional medicines to help with side effects. Follow all directions for their use. Call your doctor or health care professional for advice if you get a fever, chills or sore throat, or other symptoms of a cold or flu. Do not treat yourself. This drug decreases your body's ability to fight infections. Try to avoid being around people who are sick. This medicine may increase your risk to bruise or bleed. Call your doctor or health care professional if you notice any unusual bleeding. Be careful brushing and flossing your teeth or using a toothpick because you may get an infection or bleed more easily. If you have any dental work done, tell your dentist you are receiving this medicine. Avoid taking products that contain aspirin, acetaminophen, ibuprofen, naproxen, or ketoprofen unless instructed by your doctor. These medicines may hide a fever. This medicine contains an alcohol in the  product. You may get drowsy or dizzy. Do not drive, use machinery, or do anything that needs mental alertness until you know how this medicine affects you. Do not stand or sit up quickly, especially if you are an older patient. This reduces the risk of dizzy or fainting spells. Avoid alcoholic drinks Do not become pregnant while taking this medicine. Women should inform their  doctor if they wish to become pregnant or think they might be pregnant. There is a potential for serious side effects to an unborn child. Talk to your health care professional or pharmacist for more information. Do not breast-feed an infant while taking this medicine. What side effects may I notice from receiving this medicine? Side effects that you should report to your doctor or health care professional as soon as possible: -allergic reactions like skin rash, itching or hives, swelling of the face, lips, or tongue -low blood counts - This drug may decrease the number of white blood cells, red blood cells and platelets. You may be at increased risk for infections and bleeding. -signs of infection - fever or chills, cough, sore throat, pain or difficulty passing urine -signs of decreased platelets or bleeding - bruising, pinpoint red spots on the skin, black, tarry stools, nosebleeds -signs of decreased red blood cells - unusually weak or tired, fainting spells, lightheadedness -breathing problems -fast or irregular heartbeat -low blood pressure -mouth sores -nausea and vomiting -pain, swelling, redness or irritation at the injection site -pain, tingling, numbness in the hands or feet -swelling of the ankle, feet, hands -weight gain Side effects that usually do not require medical attention (report to your prescriber or health care professional if they continue or are bothersome): -bone pain -complete hair loss including hair on your head, underarms, pubic hair, eyebrows, and eyelashes -diarrhea -excessive tearing -changes in the color of fingernails -loosening of the fingernails -nausea -muscle pain -red flush to skin -sweating -weak or tired This list may not describe all possible side effects. Call your doctor for medical advice about side effects. You may report side effects to FDA at 1-800-FDA-1088. Where should I keep my medicine? This drug is given in a hospital or clinic and  will not be stored at home. NOTE: This sheet is a summary. It may not cover all possible information. If you have questions about this medicine, talk to your doctor, pharmacist, or health care provider.  2015, Elsevier/Gold Standard. (2013-06-09 22:21:02) Carboplatin injection What is this medicine? CARBOPLATIN (KAR boe pla tin) is a chemotherapy drug. It targets fast dividing cells, like cancer cells, and causes these cells to die. This medicine is used to treat ovarian cancer and many other cancers. This medicine may be used for other purposes; ask your health care provider or pharmacist if you have questions. COMMON BRAND NAME(S): Paraplatin What should I tell my health care provider before I take this medicine? They need to know if you have any of these conditions: -blood disorders -hearing problems -kidney disease -recent or ongoing radiation therapy -an unusual or allergic reaction to carboplatin, cisplatin, other chemotherapy, other medicines, foods, dyes, or preservatives -pregnant or trying to get pregnant -breast-feeding How should I use this medicine? This drug is usually given as an infusion into a vein. It is administered in a hospital or clinic by a specially trained health care professional. Talk to your pediatrician regarding the use of this medicine in children. Special care may be needed. Overdosage: If you think you have taken too much of this medicine contact  a poison control center or emergency room at once. NOTE: This medicine is only for you. Do not share this medicine with others. What if I miss a dose? It is important not to miss a dose. Call your doctor or health care professional if you are unable to keep an appointment. What may interact with this medicine? -medicines for seizures -medicines to increase blood counts like filgrastim, pegfilgrastim, sargramostim -some antibiotics like amikacin, gentamicin, neomycin, streptomycin, tobramycin -vaccines Talk to  your doctor or health care professional before taking any of these medicines: -acetaminophen -aspirin -ibuprofen -ketoprofen -naproxen This list may not describe all possible interactions. Give your health care provider a list of all the medicines, herbs, non-prescription drugs, or dietary supplements you use. Also tell them if you smoke, drink alcohol, or use illegal drugs. Some items may interact with your medicine. What should I watch for while using this medicine? Your condition will be monitored carefully while you are receiving this medicine. You will need important blood work done while you are taking this medicine. This drug may make you feel generally unwell. This is not uncommon, as chemotherapy can affect healthy cells as well as cancer cells. Report any side effects. Continue your course of treatment even though you feel ill unless your doctor tells you to stop. In some cases, you may be given additional medicines to help with side effects. Follow all directions for their use. Call your doctor or health care professional for advice if you get a fever, chills or sore throat, or other symptoms of a cold or flu. Do not treat yourself. This drug decreases your body's ability to fight infections. Try to avoid being around people who are sick. This medicine may increase your risk to bruise or bleed. Call your doctor or health care professional if you notice any unusual bleeding. Be careful brushing and flossing your teeth or using a toothpick because you may get an infection or bleed more easily. If you have any dental work done, tell your dentist you are receiving this medicine. Avoid taking products that contain aspirin, acetaminophen, ibuprofen, naproxen, or ketoprofen unless instructed by your doctor. These medicines may hide a fever. Do not become pregnant while taking this medicine. Women should inform their doctor if they wish to become pregnant or think they might be pregnant. There is a  potential for serious side effects to an unborn child. Talk to your health care professional or pharmacist for more information. Do not breast-feed an infant while taking this medicine. What side effects may I notice from receiving this medicine? Side effects that you should report to your doctor or health care professional as soon as possible: -allergic reactions like skin rash, itching or hives, swelling of the face, lips, or tongue -signs of infection - fever or chills, cough, sore throat, pain or difficulty passing urine -signs of decreased platelets or bleeding - bruising, pinpoint red spots on the skin, black, tarry stools, nosebleeds -signs of decreased red blood cells - unusually weak or tired, fainting spells, lightheadedness -breathing problems -changes in hearing -changes in vision -chest pain -high blood pressure -low blood counts - This drug may decrease the number of white blood cells, red blood cells and platelets. You may be at increased risk for infections and bleeding. -nausea and vomiting -pain, swelling, redness or irritation at the injection site -pain, tingling, numbness in the hands or feet -problems with balance, talking, walking -trouble passing urine or change in the amount of urine Side effects that usually  do not require medical attention (report to your doctor or health care professional if they continue or are bothersome): -hair loss -loss of appetite -metallic taste in the mouth or changes in taste This list may not describe all possible side effects. Call your doctor for medical advice about side effects. You may report side effects to FDA at 1-800-FDA-1088. Where should I keep my medicine? This drug is given in a hospital or clinic and will not be stored at home. NOTE: This sheet is a summary. It may not cover all possible information. If you have questions about this medicine, talk to your doctor, pharmacist, or health care provider.  2015, Elsevier/Gold  Standard. (2007-10-19 14:38:05)

## 2015-01-23 ENCOUNTER — Ambulatory Visit: Payer: Medicare Other

## 2015-01-23 ENCOUNTER — Ambulatory Visit (INDEPENDENT_AMBULATORY_CARE_PROVIDER_SITE_OTHER): Payer: Medicare Other | Admitting: General Surgery

## 2015-01-23 ENCOUNTER — Other Ambulatory Visit: Payer: Self-pay | Admitting: *Deleted

## 2015-01-23 ENCOUNTER — Encounter: Payer: Self-pay | Admitting: General Surgery

## 2015-01-23 VITALS — BP 132/72 | HR 68 | Resp 12 | Ht 64.0 in | Wt 147.0 lb

## 2015-01-23 DIAGNOSIS — C50912 Malignant neoplasm of unspecified site of left female breast: Secondary | ICD-10-CM

## 2015-01-23 DIAGNOSIS — C773 Secondary and unspecified malignant neoplasm of axilla and upper limb lymph nodes: Secondary | ICD-10-CM

## 2015-01-23 DIAGNOSIS — C50919 Malignant neoplasm of unspecified site of unspecified female breast: Secondary | ICD-10-CM

## 2015-01-23 MED ORDER — LIDOCAINE-PRILOCAINE 2.5-2.5 % EX CREA: 1.0000 "application " | TOPICAL_CREAM | CUTANEOUS | Status: DC | PRN

## 2015-01-23 NOTE — Patient Instructions (Signed)
The patient is aware to use a heating pad as needed for comfort. Patient to return in two months.

## 2015-01-23 NOTE — Progress Notes (Signed)
Patient ID: [name], female   DOB: 03-03-1941, 74 y.o.   MRN: [id number]  Chief Complaint  Patient presents with  . Routine Post Op    left axilla biopsy    HPI [name] is a 74 y.o. female here today for her post op left axilla biopsy done on 01/15/15. She states she is doing well.  HPI  Past Medical History  Diagnosis Date  . 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 Hearing loss   . Memory impairment     seen by Dr Manuella Ghazi  possible post crainiotomy from radiation  . 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    Past Surgical History  Procedure Laterality Date  . Appendectomy  1950  . Brain surgery  1995    left frontal/temporal  . Colonoscopy  2010    Dr. [name]  . Breast biopsy Left 1997  . 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 Breast biopsy Left 01/08/2015    Procedure: BREAST BIOPSY WITH NEEDLE LOCALIZATION;  Surgeon: [name], MD;  Location: [facility];  Service: General;  Laterality: Left;  . Breast lumpectomy Left 01/08/2015    Procedure: LUMPECTOMY;  Surgeon: [name], MD;  Location: [facility];  Service: General;  Laterality: Left;  . Sentinel node biopsy Left 01/16/2015    Procedure: SENTINEL NODE BIOPSY;  Surgeon: [name], MD;  Location: [facility];  Service: General;  Laterality: Left;  . Portacath placement Right 01/16/2015    Procedure: INSERTION PORT-A-CATH;  Surgeon: [name], MD;  Location: [facility];  Service: General;  Laterality: Right;    Family History  Problem Relation Age of Onset  . Breast cancer Sister 17  . Addison's disease Mother   . Stroke Father     Social History History  Substance Use Topics  . Smoking status: Former Smoker    Quit date: 12/28/1964  . Smokeless tobacco: Never Used  . Alcohol Use: 0.6 - 1.2 oz/week    1-2 Glasses of wine per week     Comment: 1 Glass  Wine / Night    No Known Allergies  Current Outpatient Prescriptions  Medication Sig Dispense Refill  . Ascorbic Acid (VITAMIN C) 100 MG tablet Take 100 mg by mouth every morning.     . Calcium Carbonate-Vit D-Min (CALTRATE 600+D PLUS MINERALS) 600-800 MG-UNIT TABS Take 1 tablet by mouth every morning.    . calcium-vitamin D 250-100 MG-UNIT per tablet Take 1 tablet by mouth 1 day or 1 dose. 600 mg    . lidocaine-prilocaine (EMLA) cream Apply 1 application topically as needed. 30 g 3  . meloxicam (MOBIC) 7.5 MG tablet Take 7.5 mg by mouth every morning.     . traMADol (ULTRAM) 50 MG tablet Take by mouth 3 (three) times daily.     . Triprolidine-Pseudoephedrine (ANTIHISTAMINE PO) Take by mouth every morning.      No current facility-administered medications for this visit.    Review of Systems Review of Systems  Constitutional: Negative.   Respiratory: Negative.   Cardiovascular: Negative.     Blood pressure 132/72, pulse 68, resp. rate 12, height 5' 4" (1.626 m), weight 147 lb (66.679 kg).  Physical Exam Physical Exam  Constitutional: She is oriented to person, place, and time. She appears well-developed and well-nourished.  HENT:  Mouth/Throat:  Oropharynx is clear and moist.  Eyes: Conjunctivae are normal. No scleral icterus.  Neck: Neck supple.  Cardiovascular: Normal rate, regular rhythm and normal heart sounds.   Pulmonary/Chest: Effort normal and breath sounds normal.    Lymphadenopathy:    She has no cervical adenopathy.  Neurological: She is alert and oriented to person, place, and time.  Skin: Skin is warm and dry.    Data Reviewed 1 less than 2 mm foci of metastatic cancer in one of 3 lymph nodes.  Assessment    Doing well status post right subclavian PowerPort placement and left axillary sentinel node biopsy.    Plan    She has met with her orthopedist and plans at present are for reconstruction of her hip after adjuvant chemotherapy.     The patient  is aware to use a heating pad to the axilla as needed for comfort. We'll plan for a follow-up examination in 2 months, earlier if needed.   PCP:  Klein, Bert J Iii CSN:  [id number]   ,  W 01/24/2015, 8:26 PM    

## 2015-01-24 ENCOUNTER — Ambulatory Visit
Admission: RE | Admit: 2015-01-24 | Discharge: 2015-01-24 | Disposition: A | Discharge status: Home / Self Care | Payer: Medicare Other | Source: Ambulatory Visit | Attending: Oncology | Admitting: Oncology

## 2015-01-24 DIAGNOSIS — Z853 Personal history of malignant neoplasm of breast: Secondary | ICD-10-CM | POA: Insufficient documentation

## 2015-01-24 DIAGNOSIS — I427 Cardiomyopathy due to drug and external agent: Secondary | ICD-10-CM | POA: Insufficient documentation

## 2015-01-24 DIAGNOSIS — T451X5A Adverse effect of antineoplastic and immunosuppressive drugs, initial encounter: Secondary | ICD-10-CM

## 2015-01-24 MED ORDER — TECHNETIUM TC 99M-LABELED RED BLOOD CELLS IV KIT
20.0000 | PACK | Freq: Once | INTRAVENOUS | Status: AC | PRN
  Administered 2015-01-24: 20.97 via INTRAVENOUS

## 2015-01-26 ENCOUNTER — Encounter: Payer: Self-pay | Admitting: *Deleted

## 2015-01-29 ENCOUNTER — Encounter: Payer: Self-pay | Admitting: Oncology

## 2015-01-29 NOTE — Progress Notes (Signed)
[name] @ [facility] Telephone:([phone]) [phone]  Fax:(336) [place] OB: 12-Nov-1940  MR#: [id number]  FWY#:[id number]  Patient Care Team: [name], [name], MD (General Surgery) Self Requested  CHIEF COMPLAINT:  Chief Complaint  Patient presents with  . Follow-up    VISIT DIAGNOSIS:   No diagnosis found.   Oncology History   1.  Abnormal mammogram of the left breast.  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  Progesterone receptor negative.  HER-2/neu receptor positive 2.  Patient is starting [facility] from July OF 2016     Breast cancer metastasized to axillary lymph node   01/24/2015 Initial Diagnosis Breast cancer metastasized to axillary lymph node    Oncology Flowsheet 01/08/2015 01/16/2015  dexamethasone (DECADRON) IJ - -  ondansetron (ZOFRAN) IV - -    INTERVAL HISTORY:  74 year old lady who had a regular mammogram recent mammogram with abnormal ultrasound was done.  There were 3 nodules were found.  Patient underwent stereotactic ultrasound-guided biopsy.  3 nodules were 4 cm 5 cm in 6 cm away from nipple.  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  \.  January 18, 2015 Patient has undergone lumpectomy and sentinel lymph node evaluation.  1.5 cm tumor invasive carcinoma margins are clear estrogen receptor negative.  Progesterone receptor negative.  HER-2/neu receptor positive with micro-metastases in the lymph node.  Patient is here for ongoing evaluation and treatment consideration.  Patient also has a severe right hip pain and being evaluated by  orthopedic surgeon REVIEW OF SYSTEMS:   GENERAL:  Feels good.  Active.  No fevers, sweats or weight loss. PERFORMANCE STATUS (ECOG):   HEENT:  No visual changes, runny nose, sore throat, mouth sores or tenderness. Lungs: No shortness of breath or cough.  No hemoptysis. Cardiac:  No chest pain, palpitations, orthopnea, or PND. GI:  No nausea, vomiting, diarrhea, constipation, melena or hematochezia. GU:  No urgency, frequency, dysuria, or hematuria. Prior use of birth control pills several years ago.  Did not use any estrogen or any other hormone replacement therapy Musculoskeletal: Right hip pain .  She is considering surgical intervention Extremities:  No pain or swelling. Skin:  No rashes or skin changes. Neuro:  No headache, numbness or weakness, balance or coordination issues. Endocrine:  No diabetes, thyroid issues, hot flashes or night sweats. Psych:  No mood changes, depression or anxiety. Pain:  No focal pain. Review of systems:  All other systems reviewed and found to be negative. As per HPI. Otherwise, a complete review of systems is negatve.  PAST MEDICAL HISTORY: Past Medical History  Diagnosis Date  . 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 Hearing loss   . Memory impairment     seen by Dr Manuella Ghazi  possible post crainiotomy from radiation  . 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    PAST SURGICAL HISTORY: Past Surgical History  Procedure Laterality Date  . Appendectomy  1950  . Brain surgery  1995  left frontal/temporal  . Colonoscopy  2010    Dr. [name]  . Breast biopsy Left 1997  . 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 Breast biopsy Left 01/08/2015    Procedure: BREAST BIOPSY WITH NEEDLE LOCALIZATION;  Surgeon: [name], MD;  Location: [facility] ORS;  Service: General;  Laterality: Left;  . Breast lumpectomy Left 01/08/2015     Procedure: LUMPECTOMY;  Surgeon: [name], MD;  Location: [facility] OR[facility];  Service: General;  Laterality: Left;  . Sentinel node biopsy Left 01/16/2015    Procedure: SENTINEL NODE BIOPSY;  Surgeon: [name], MD;  Location: [facility] OR[facility];  Service: General;  Laterality: Left;  . Portacath placement Right 01/16/2015    Procedure: INSERTION PORT-A-CATH;  Surgeon: [name], MD;  Location: [facility] OR[facility];  Service: General;  Laterality: Right;    FAMILY HISTORY Family History  Problem Relation Age of Onset  . Breast cancer Sister 92  . Addison's disease Mother   . Stroke Father            HEALTH MAINTENANCE: History  Substance Use Topics  . Smoking status: Former Smoker    Quit date: 12/28/1964  . Smokeless tobacco: Never Used  . Alcohol Use: 0.6 - 1.2 oz/week    1-2 Glasses of wine per week     Comment: 1 Glass Wine / Night      No Known Allergies  Current Outpatient Prescriptions  Medication Sig Dispense Refill  . Ascorbic Acid (VITAMIN C) 100 MG tablet Take 100 mg by mouth every morning.     . Calcium Carbonate-Vit D-Min (CALTRATE 600+D PLUS MINERALS) 600-800 MG-UNIT TABS Take 1 tablet by mouth every morning.    . calcium-vitamin D 250-100 MG-UNIT per tablet Take 1 tablet by mouth 1 day or 1 dose. 600 mg    . traMADol (ULTRAM) 50 MG tablet Take by mouth 3 (three) times daily.     . Triprolidine-Pseudoephedrine (ANTIHISTAMINE PO) Take by mouth every morning.     . lidocaine-prilocaine (EMLA) cream Apply 1 application topically as needed. 30 g 3  . meloxicam (MOBIC) 7.5 MG tablet Take 7.5 mg by mouth every morning.      No current facility-administered medications for this visit.    OBJECTIVE: PHYSICAL EXAM: GENERAL:  Well developed, well nourished, sitting comfortably in the exam room in no acute distress. MENTAL STATUS:  Alert and oriented to person, place and time. HEAD: .  Normocephalic, atraumatic, face symmetric, no Cushingoid features. EYES:  Pupils  equal round and reactive to light and accomodation.  No conjunctivitis or scleral icterus. ENT:  Oropharynx clear without lesion.  Tongue normal. Mucous membranes moist.  RESPIRATORY:  Clear to auscultation without rales, wheezes or rhonchi. CARDIOVASCULAR:  Regular rate and rhythm without murmur, rub or gallop. BREAST:  Right breast without masses, skin changes or nipple discharge.  Left breast without masses, skin changes or nipple discharge.  Left breast :Some ecchymosis from the previous biopsy now patientHAS undergone a biopsy and sentinel lymph node evaluation ABDOMEN:  Soft, non-tender, with active bowel sounds, and no hepatosplenomegaly.  No masses. BACK:  No CVA tenderness.  No tenderness on percussion of the back or rib cage. SKIN:  No rashes, ulcers or lesions. EXTREMITIES: No edema, no skin discoloration or tenderness.  No palpable cords. LYMPH NODES: No palpable cervical, supraclavicular, axillary or inguinal adenopathy  NEUROLOGICAL: Unremarkable. PSYCH:  Appropriate.  Filed Vitals:   01/18/15 1130  BP: 122/76  Pulse: 71  Temp: 97.2 F (36.2 C)     Body mass index is 24.77 kg/(m^2).    ECOG FS:0 - Asymptomatic  LAB RESULTS:  No visits with results within 2 Day(s) from this visit. Latest known visit with results is:  Admission on 01/16/2015, Discharged on 01/16/2015  Component Date Value Ref Range Status  . SURGICAL PATHOLOGY 01/16/2015    Final                   Value:Surgical Pathology CASE: [id number] PATIENT: [place] Surgical Pathology Report     SPECIMEN SUBMITTED: A. Lymph node, sentinel #1 and #2 B. Lymph node, sentinel #3  CLINICAL HISTORY: None provided  PRE-OPERATIVE DIAGNOSIS: Breast Cancer  POST-OPERATIVE DIAGNOSIS: Breast Cancer     DIAGNOSIS: A. LYMPH NODE, SENTINEL #1 AND #2; EXCISION: - MICRO METASTATIC CARCINOMA INVOLVING ONE OF TWO LYMPH NODES.  B. LYMPH NODE, SENTINEL #3; EXCISION: - ONE LYMPH NODE NEGATIVE FOR  MALIGNANCY (0/1).  Comment: The focus of metastatic carcinoma measures 0.8 mm. The original frozen section slides underwent intradepartmental review and the focus of micro metastatic carcinoma is not present on the slides. These findings were communicated to Dr. Bary Castilla on 01/16/2105.   GROSS DESCRIPTION: A. Intra Operative Consultation:     Received: Fresh     Specimen: Sentinel node #1 and #2     Pathologic Evaluation: Frozen section     Diagnosis: A1 and A2; 2 lymph nodes                         : Negative for macrometastasis     Communicated to: Dr. Bary Castilla at 11:33 AM on 01/16/2015 by Dr. Reuel Derby     Tissue submitted: A1-A2 frozen section  Labeled: Sentinel node #1 and #2 Tissue Fragment(s): 1 Measurement: 2.4 x 1.5 x 0.6 cm Comment: Piece of adipose tissue which upon dissection reveals 2 pink red lymph node candidates measuring 0.7 x 0.5 x 0.4 cm and 1.2 x 0.8 x 0.5 cm.  Entirely submitted in cassette(s): 1-smaller lymph node frozen section 2-larger lymph node frozen section  B. Intra Operative Consultation:     Received: Fresh     Specimen: Sentinel node #3     Pathologic Evaluation: Frozen section     Diagnosis: B1 one lymph node: Negative for macrometastasis     Communicated to: Dr. Bary Castilla at 11:39 AM on 01/16/2015 by Dr. Reuel Derby     Tissue submitted: B1  Labeled: Sentinel node #3 Tissue Fragment(s): 1 Measurement: 1.7 x 1.2 x 0.6 cm Comment: Pink red lymph node candidate covered by adipose tissue, sectioned  Entirely submitted in cassette                         (s): 1          Final Diagnosis performed by Quay Burow, MD.  Electronically signed 01/17/2015 3:41:06PM    The electronic signature indicates that the named Attending Pathologist has evaluated the specimen  Technical component performed at [facility], [address] [place], [place] [address] Lab: [phone] Dir: Darrick Penna. Evette Doffing, MD  Professional component performed at [facility],  [facility], [place], [place], [place] [address] Lab: [phone] Dir: Dellia Nims. Rubinas, MD       STUDIES: Nm Cardiac Muga Rest  01/24/2015   CLINICAL DATA:  Cardiomyopathy due to chemotherapy, high risk chemotherapy utilization  EXAM: NUCLEAR MEDICINE CARDIAC BLOOD POOL IMAGING (MUGA)  TECHNIQUE: Cardiac  multi-gated acquisition was performed at rest following intravenous injection of Tc-6mlabeled red blood cells.  RADIOPHARMACEUTICALS:  20.97 mCi Tc-9108mn-vitro labeled autologous red blood cells IV  COMPARISON:  None  FINDINGS: LEFT ventricular ejection fraction is calculated at 67%, which is within the normal range.  Patient was rhythmic during imaging, with study performed at a heart rate of 78 bpm.  Cine analysis of the gated blood pool in 3 projections demonstrates normal LEFT ventricular wall motion.  IMPRESSION: Normal LEFT ventricular ejection fraction is 67% with normal LV wall motion.   Electronically Signed   By: MaLavonia Dana.D.   On: 01/24/2015 14:42   Nm Sentinel Node Inj-no Rpt (breast)  01/16/2015   CLINICAL DATA: breast cancer   Sulfur colloid was injected intradermally by the nuclear medicine  technologist for breast cancer sentinel node localization.    Mm Breast Surgical Specimen  01/08/2015   CLINICAL DATA:  Post left breast surgical excision for malignancy.  EXAM: SPECIMEN RADIOGRAPH OF THE LEFT BREAST  COMPARISON:  Previous exam(s).  FINDINGS: Status post excision of the left breast. The 2 wire tips and the 3 biopsy marker clips are present in the surgical specimen. The findings were discussed with the surgeon.  IMPRESSION: Specimen radiograph of the left breast.   Electronically Signed   By: RaAltamese Cabal.D.   On: 01/08/2015 11:34   Dg Chest Port 1 View  01/16/2015   CLINICAL DATA:  Port-A-Cath placement  EXAM: DG C-ARM 1-60 MIN - NRPT [facility]; PORTABLE CHEST - 1 VIEW  COMPARISON:  None.  FINDINGS: Port-A-Cath tip is at the cavoatrial  junction. No pneumothorax. Lungs are clear. Heart size and pulmonary vascularity are normal. There is no demonstrable thoracic adenopathy.  IMPRESSION: No edema or consolidation. Port-A-Cath tip at cavoatrial junction level. No pneumothorax.   Electronically Signed   By: WiLowella GripII M.D.   On: 01/16/2015 13:18   Dg C-arm 1-60 Min-no Report  01/16/2015   CLINICAL DATA: port a cath   C-ARM 1-60 MINUTES  Fluoroscopy was utilized by the requesting physician.  No radiographic  interpretation.    Mm Lt Plc Breast Loc Dev   1st Lesion  Inc Mammo Guide  01/08/2015   CLINICAL DATA:  Preoperative localization of the left breast mass prior to surgical excision.  EXAM: NEEDLE LOCALIZATION OF THE LEFT BREAST WITH MAMMO GUIDANCE  COMPARISON:  Previous exams.  FINDINGS: Patient presents for needle localization prior to surgical excision of left breast mass. I met with the patient and we discussed the procedure of needle localization including benefits and alternatives. We discussed the high likelihood of a successful procedure. We discussed the risks of the procedure, including infection, bleeding, tissue injury, and further surgery. Informed, written consent was given. The usual time-out protocol was performed immediately prior to the procedure.  Prior to the procedure, the case was discussed with the attending surgeon who requests that the 3 clips be bracketed.  Using mammographic guidance, sterile technique, 2% lidocaine, a 5 cm modified Kopan's needle and a second 7 cm modified Kopans needle, the 3 clips were bracketed utilizing a medial approach. The images were marked for Dr. ByBary Castilla IMPRESSION: Needle localization left breast. No apparent complications.   Electronically Signed   By: RaAltamese Cabal.D.   On: 01/08/2015 12:39    ASSESSMENT:  [age]year-old lady with a history of carcinoma breast.  Stage IC with micro-metastases in the lymph node ESTROGEN  receptor negative progesterone receptor  negative HER-2/neu receptor positive tumor Status post biopsy. right hip pain Previous history of meningioma treated with radiation therapy     PLAN:   Had detailed discussion with patient and her husband's ophthalmologist.  Consideration of systemic therapy with Taxotere, carboplatinum and Herceptin may benefit the patient.  Some of the studies are demonstrative single agent Taxol in tumor less than 2 cm equally effective. CONSIDERING   Micro  metastases may have to consider aggressive chemotherapy  She also has a right hip pain and meeting with orthopedic surgeon Depending on their opinion chemotherapy can be done after surgery is done or before surgery is planned   Patient expressed understanding and was in agreement with this plan. She also understands that She can call clinic at any time with any questions, concerns, or complaints.    DCIS (ductal carcinoma in situ) of breast   Staging form: Breast, AJCC 7th Edition     Clinical: Stage 0 (Tis (DCIS), N0, M0) - Unsigned   [name], MD   01/29/2015 6:40 PM

## 2015-01-30 ENCOUNTER — Other Ambulatory Visit: Payer: Self-pay | Admitting: *Deleted

## 2015-01-30 ENCOUNTER — Telehealth: Payer: Self-pay | Admitting: *Deleted

## 2015-01-30 DIAGNOSIS — C50912 Malignant neoplasm of unspecified site of left female breast: Secondary | ICD-10-CM

## 2015-01-30 DIAGNOSIS — C773 Secondary and unspecified malignant neoplasm of axilla and upper limb lymph nodes: Principal | ICD-10-CM

## 2015-01-30 MED ORDER — LORAZEPAM 0.5 MG PO TABS: 0.5000 mg | ORAL_TABLET | Freq: Four times a day (QID) | ORAL | Status: DC | PRN

## 2015-01-30 MED ORDER — ONDANSETRON HCL 8 MG PO TABS: 8.0000 mg | ORAL_TABLET | Freq: Two times a day (BID) | ORAL | Status: DC

## 2015-01-30 MED ORDER — DEXAMETHASONE 4 MG PO TABS: 8.0000 mg | ORAL_TABLET | Freq: Two times a day (BID) | ORAL | Status: DC

## 2015-01-30 NOTE — Telephone Encounter (Signed)
Called patient and spoke with Dr. Tobe Sos to inform patient that 3 prescriptions have been called to [place], Ativan and Decadron.  Patient should start Decadron today (2 tabs twice a day).  Also instructed patient to bring all her medications with her to her appointment tomorrow.  Verbalized understanding.

## 2015-01-31 ENCOUNTER — Inpatient Hospital Stay: Payer: Medicare Other | Attending: Oncology

## 2015-01-31 ENCOUNTER — Inpatient Hospital Stay: Payer: Medicare Other

## 2015-01-31 ENCOUNTER — Inpatient Hospital Stay (HOSPITAL_BASED_OUTPATIENT_CLINIC_OR_DEPARTMENT_OTHER): Payer: Medicare Other | Admitting: Oncology

## 2015-01-31 VITALS — BP 112/58 | HR 75

## 2015-01-31 VITALS — BP 126/80 | HR 93 | Temp 97.8°F | Wt 143.1 lb

## 2015-01-31 DIAGNOSIS — Z87891 Personal history of nicotine dependence: Secondary | ICD-10-CM | POA: Insufficient documentation

## 2015-01-31 DIAGNOSIS — C773 Secondary and unspecified malignant neoplasm of axilla and upper limb lymph nodes: Secondary | ICD-10-CM | POA: Insufficient documentation

## 2015-01-31 DIAGNOSIS — D0512 Intraductal carcinoma in situ of left breast: Secondary | ICD-10-CM | POA: Insufficient documentation

## 2015-01-31 DIAGNOSIS — M1611 Unilateral primary osteoarthritis, right hip: Secondary | ICD-10-CM | POA: Insufficient documentation

## 2015-01-31 DIAGNOSIS — M25551 Pain in right hip: Secondary | ICD-10-CM | POA: Insufficient documentation

## 2015-01-31 DIAGNOSIS — Z79899 Other long term (current) drug therapy: Secondary | ICD-10-CM

## 2015-01-31 DIAGNOSIS — C50912 Malignant neoplasm of unspecified site of left female breast: Secondary | ICD-10-CM

## 2015-01-31 DIAGNOSIS — Z418 Encounter for other procedures for purposes other than remedying health state: Secondary | ICD-10-CM

## 2015-01-31 DIAGNOSIS — Z5111 Encounter for antineoplastic chemotherapy: Secondary | ICD-10-CM | POA: Insufficient documentation

## 2015-01-31 DIAGNOSIS — Z171 Estrogen receptor negative status [ER-]: Secondary | ICD-10-CM | POA: Diagnosis not present

## 2015-01-31 LAB — COMPREHENSIVE METABOLIC PANEL
ALT: 15 U/L (ref 14–54)
AST: 38 U/L (ref 15–41)
Albumin: 3.8 g/dL (ref 3.5–5.0)
Alkaline Phosphatase: 72 U/L (ref 38–126)
Anion gap: 14 (ref 5–15)
BUN: 20 mg/dL (ref 6–20)
CALCIUM: 9.2 mg/dL (ref 8.9–10.3)
CHLORIDE: 102 mmol/L (ref 101–111)
CO2: 23 mmol/L (ref 22–32)
Creatinine, Ser: 0.83 mg/dL (ref 0.44–1.00)
Glucose, Bld: 199 mg/dL — ABNORMAL HIGH (ref 65–99)
Potassium: 4 mmol/L (ref 3.5–5.1)
Sodium: 139 mmol/L (ref 135–145)
Total Bilirubin: 0.5 mg/dL (ref 0.3–1.2)
Total Protein: 6.8 g/dL (ref 6.5–8.1)

## 2015-01-31 LAB — CBC WITH DIFFERENTIAL/PLATELET
Basophils Absolute: 0 10*3/uL (ref 0–0.1)
Basophils Relative: 0 %
EOS ABS: 0 10*3/uL (ref 0–0.7)
EOS PCT: 0 %
HEMATOCRIT: 40.9 % (ref 35.0–47.0)
HEMOGLOBIN: 13.4 g/dL (ref 12.0–16.0)
LYMPHS ABS: 0.5 10*3/uL — AB (ref 1.0–3.6)
Lymphocytes Relative: 6 %
MCH: 29.5 pg (ref 26.0–34.0)
MCHC: 32.7 g/dL (ref 32.0–36.0)
MCV: 90.3 fL (ref 80.0–100.0)
MONO ABS: 0.1 10*3/uL — AB (ref 0.2–0.9)
Monocytes Relative: 1 %
NEUTROS PCT: 93 %
Neutro Abs: 8.1 10*3/uL — ABNORMAL HIGH (ref 1.4–6.5)
Platelets: 390 10*3/uL (ref 150–440)
RBC: 4.53 MIL/uL (ref 3.80–5.20)
RDW: 13 % (ref 11.5–14.5)
WBC: 8.7 10*3/uL (ref 3.6–11.0)

## 2015-01-31 MED ORDER — DIPHENHYDRAMINE HCL 25 MG PO CAPS
50.0000 mg | ORAL_CAPSULE | Freq: Once | ORAL | Status: AC
  Administered 2015-01-31: 50 mg via ORAL
  Filled 2015-01-31: qty 2

## 2015-01-31 MED ORDER — TRASTUZUMAB CHEMO INJECTION 440 MG
8.0000 mg/kg | Freq: Once | INTRAVENOUS | Status: AC
  Administered 2015-01-31: 525 mg via INTRAVENOUS
  Filled 2015-01-31: qty 25

## 2015-01-31 MED ORDER — PALONOSETRON HCL INJECTION 0.25 MG/5ML
0.2500 mg | Freq: Once | INTRAVENOUS | Status: AC
  Administered 2015-01-31: 0.25 mg via INTRAVENOUS
  Filled 2015-01-31: qty 5

## 2015-01-31 MED ORDER — ACETAMINOPHEN 325 MG PO TABS
650.0000 mg | ORAL_TABLET | Freq: Once | ORAL | Status: AC
  Administered 2015-01-31: 650 mg via ORAL
  Filled 2015-01-31: qty 2

## 2015-01-31 MED ORDER — SODIUM CHLORIDE 0.9 % IV SOLN
470.0000 mg | Freq: Once | INTRAVENOUS | Status: AC
  Administered 2015-01-31: 470 mg via INTRAVENOUS
  Filled 2015-01-31: qty 47

## 2015-01-31 MED ORDER — PEGFILGRASTIM 6 MG/0.6ML ~~LOC~~ PSKT
6.0000 mg | PREFILLED_SYRINGE | Freq: Once | SUBCUTANEOUS | Status: AC
  Administered 2015-01-31: 6 mg via SUBCUTANEOUS
  Filled 2015-01-31: qty 0.6

## 2015-01-31 MED ORDER — HEPARIN SOD (PORK) LOCK FLUSH 100 UNIT/ML IV SOLN
500.0000 [IU] | Freq: Once | INTRAVENOUS | Status: AC | PRN
  Administered 2015-01-31: 500 [IU]
  Filled 2015-01-31 (×2): qty 5

## 2015-01-31 MED ORDER — SODIUM CHLORIDE 0.9 % IV SOLN
Freq: Once | INTRAVENOUS | Status: AC
  Administered 2015-01-31: 10:00:00 via INTRAVENOUS
  Filled 2015-01-31: qty 5

## 2015-01-31 MED ORDER — DOCETAXEL CHEMO INJECTION 160 MG/16ML
70.0000 mg/m2 | Freq: Once | INTRAVENOUS | Status: AC
  Administered 2015-01-31: 120 mg via INTRAVENOUS
  Filled 2015-01-31: qty 12

## 2015-01-31 MED ORDER — SODIUM CHLORIDE 0.9 % IV SOLN
Freq: Once | INTRAVENOUS | Status: AC
  Administered 2015-01-31: 10:00:00 via INTRAVENOUS
  Filled 2015-01-31: qty 1000

## 2015-01-31 MED ORDER — SODIUM CHLORIDE 0.9 % IJ SOLN
10.0000 mL | INTRAMUSCULAR | Status: DC | PRN
  Filled 2015-01-31: qty 10

## 2015-01-31 MED ORDER — DEXAMETHASONE 4 MG PO TABS: 8.0000 mg | ORAL_TABLET | Freq: Two times a day (BID) | ORAL | Status: DC

## 2015-01-31 NOTE — Progress Notes (Signed)
Patient does have living will.  Former smoker. 

## 2015-02-01 ENCOUNTER — Telehealth: Payer: Self-pay | Admitting: *Deleted

## 2015-02-01 NOTE — Telephone Encounter (Signed)
Patient called today with a question regarding lab work for her upcoming appointment with Dr. Caryl Comes.  Wants to know if she should get lab work completed in his office.  Informed her to call his office and asked what he labs he would like to collect that day, and compare to what she is getting here.  If they are the same, she is to ask can she bring him a copy of labs from here.  She is agreeable.  States she is doing great since her chemo yesterday.  States she has been eating well, and drinking plenty of fluids.  She is to call if she has any questions or needs.

## 2015-02-03 ENCOUNTER — Encounter: Payer: Self-pay | Admitting: Oncology

## 2015-02-03 NOTE — Progress Notes (Signed)
[name] @ [facility] [phone]  Fax:([phone]) [place] OB: 1940-12-31  MR#: [id number]  GYB#:[id number]  Patient Care Team: [name], [name], MD (General Surgery) Self Requested  CHIEF COMPLAINT:  Chief Complaint  Patient presents with  . Follow-up    VISIT DIAGNOSIS:     ICD-9-CM ICD-10-CM   1. Breast cancer metastasized to axillary lymph node, left 174.9 C50.912 dexamethasone (DECADRON) 4 MG tablet   196.3 C77.3 Magnesium     Oncology History   1.  Abnormal mammogram of the left breast.  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  Progesterone receptor negative.  HER-2/neu receptor positive 2.  Patient is starting [facility] from July OF 2016     Breast cancer metastasized to axillary lymph node   01/24/2015 Initial Diagnosis Breast cancer metastasized to axillary lymph node    Oncology Flowsheet 01/08/2015 01/16/2015 01/31/2015  Day, Cycle - - Day 1, Cycle 1  CARBOplatin (PARAPLATIN) IV - - 470 mg  dexamethasone (DECADRON) IJ - - -  dexamethasone (DECADRON) IV - - [ 12 mg ]  diphenhydrAMINE (BENADRYL) PO - - 50 mg  DOCEtaxel (TAXOTERE) IV - - 70 mg/m2  fosaprepitant (EMEND) IV - - [ 150 mg ]  ondansetron (ZOFRAN) IV - - -  palonosetron (ALOXI) IV - - 0.25 mg  pegfilgrastim (NEULASTA ONPRO KIT) Clifford - - 6 mg  trastuzumab (HERCEPTIN) IV - - 8 mg/kg    INTERVAL HISTORY:  74 year old lady who had a regular mammogram recent mammogram with abnormal ultrasound was done.  There were 3 nodules were found.  Patient underwent stereotactic ultrasound-guided biopsy.  3 nodules were 4 cm 5 cm in 6 cm away from nipple.  And the one nodule at 5 cm revealed focal changes consistent with high-grade ductal   Carcinoma  in situ  Patient has been evaluated by surgeon and here for further evaluation and treatment consideration.  Slightly apprehensive but not any acute distress January 31, 2015 Patient is here for ongoing evaluation and treatment consideration.  She had a port placement done.  Patient has undergone chemotherapy class and also MUGA scan of the heart.  She was seen by orthopedic surgeon and they did not want to consider surgical intervention at present time REVIEW OF SYSTEMS:   GENERAL:  Feels good.  Active.  No fevers, sweats or weight loss. PERFORMANCE STATUS (ECOG): 0  HEENT:  No visual changes, runny nose, sore throat, mouth sores or tenderness. Lungs: No shortness of breath or cough.  No hemoptysis. Cardiac:  No chest pain, palpitations, orthopnea, or PND. GI:  No nausea, vomiting, diarrhea, constipation, melena or hematochezia. GU:  No urgency, frequency, dysuria, or hematuria. Prior use of birth control pills several years ago.  Did not use any estrogen or any other hormone replacement therapy Musculoskeletal: Right hip pain .  She is considering surgical intervention Extremities:  No pain or swelling. Skin:  No rashes or skin changes. Neuro:  No headache, numbness or weakness, balance or coordination issues. Endocrine:  No diabetes, thyroid issues, hot flashes or night sweats. Psych:  No mood changes, depression or anxiety. Pain:  No focal pain. Review of systems:  All other systems reviewed and found to be negative. As per HPI. Otherwise, a complete review of  systems is negatve.  PAST MEDICAL HISTORY: Past Medical History  Diagnosis Date  . 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 Hearing loss   . Memory impairment     seen by Dr Manuella Ghazi  possible post crainiotomy from radiation  . 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    PAST SURGICAL HISTORY: Past Surgical History   Procedure Laterality Date  . Appendectomy  1950  . Brain surgery  1995    left frontal/temporal  . Colonoscopy  2010    Dr. [name]  . Breast biopsy Left 1997  . 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 Breast biopsy Left 01/08/2015    Procedure: BREAST BIOPSY WITH NEEDLE LOCALIZATION;  Surgeon: [name], MD;  Location: [facility] ORS;  Service: General;  Laterality: Left;  . Breast lumpectomy Left 01/08/2015    Procedure: LUMPECTOMY;  Surgeon: [name], MD;  Location: [facility] ORS;  Service: General;  Laterality: Left;  . Sentinel node biopsy Left 01/16/2015    Procedure: SENTINEL NODE BIOPSY;  Surgeon: [name], MD;  Location: [facility] ORS;  Service: General;  Laterality: Left;  . Portacath placement Right 01/16/2015    Procedure: INSERTION PORT-A-CATH;  Surgeon: [name], MD;  Location: [facility] ORS;  Service: General;  Laterality: Right;    FAMILY HISTORY Family History  Problem Relation Age of Onset  . Breast cancer Sister 28  . Addison's disease Mother   . Stroke Father            HEALTH MAINTENANCE: History  Substance Use Topics  . Smoking status: Former Smoker    Quit date: 12/28/1964  . Smokeless tobacco: Never Used  . Alcohol Use: 0.6 - 1.2 oz/week    1-2 Glasses of wine per week     Comment: 1 Glass Wine / Night      No Known Allergies  Current Outpatient Prescriptions  Medication Sig Dispense Refill  . Ascorbic Acid (VITAMIN C) 100 MG tablet Take 100 mg by mouth every morning.     . Calcium Carbonate-Vit D-Min (CALTRATE 600+D PLUS MINERALS) 600-800 MG-UNIT TABS Take 1 tablet by mouth every morning.    . calcium-vitamin D 250-100 MG-UNIT per tablet Take 1 tablet by mouth 1 day or 1 dose. 600 mg    . dexamethasone (DECADRON) 4 MG tablet Take 2 tablets (8 mg total) by mouth 2 (two) times daily. Start the day before Taxotere. Then again the day after chemo. 30 tablet 1  . lidocaine-prilocaine (EMLA) cream Apply 1 application  topically as needed. 30 g 3  . LORazepam (ATIVAN) 0.5 MG tablet Take 1 tablet (0.5 mg total) by mouth every 6 (six) hours as needed (Nausea or vomiting). 30 tablet 0  . meloxicam (MOBIC) 7.5 MG tablet Take 7.5 mg by mouth every morning.     . ondansetron (ZOFRAN) 8 MG tablet Take 1 tablet (8 mg total) by mouth 2 (two) times daily. Start the day after chemo for 3 days. Then take as needed for nausea or vomiting. 30 tablet 1  . traMADol (ULTRAM) 50 MG tablet Take by mouth 3 (three) times daily.     . Triprolidine-Pseudoephedrine (ANTIHISTAMINE PO) Take by mouth every morning.      No current facility-administered medications for this visit.    OBJECTIVE: PHYSICAL EXAM: GENERAL:  Well developed, well nourished, sitting  comfortably in the exam room in no acute distress. MENTAL STATUS:  Alert and oriented to person, place and time. HEAD: .  Normocephalic, atraumatic, face symmetric, no Cushingoid features. EYES:  Pupils equal round and reactive to light and accomodation.  No conjunctivitis or scleral icterus. ENT:  Oropharynx clear without lesion.  Tongue normal. Mucous membranes moist.  RESPIRATORY:  Clear to auscultation without rales, wheezes or rhonchi. CARDIOVASCULAR:  Regular rate and rhythm without murmur, rub or gallop. BREAST:  Right breast without masses, skin changes or nipple discharge.  Left breast without masses, skin changes or nipple discharge.  Left breast :Some ecchymosis from the previous biopsy ABDOMEN:  Soft, non-tender, with active bowel sounds, and no hepatosplenomegaly.  No masses. BACK:  No CVA tenderness.  No tenderness on percussion of the back or rib cage. SKIN:  No rashes, ulcers or lesions. EXTREMITIES: No edema, no skin discoloration or tenderness.  No palpable cords. LYMPH NODES: No palpable cervical, supraclavicular, axillary or inguinal adenopathy  NEUROLOGICAL: Unremarkable. PSYCH:  Appropriate.  Filed Vitals:   01/31/15 0852  BP: 126/80  Pulse: 93  Temp:  97.8 F (36.6 C)     Body mass index is 24.55 kg/(m^2).    ECOG FS:0 - Asymptomatic  LAB RESULTS:  Appointment on 01/31/2015  Component Date Value Ref Range Status  . WBC 01/31/2015 8.7  3.6 - 11.0 K/uL Final   A-LINE DRAW  . RBC 01/31/2015 4.53  3.80 - 5.20 MIL/uL Final  . Hemoglobin 01/31/2015 13.4  12.0 - 16.0 g/dL Final  . HCT 01/31/2015 40.9  35.0 - 47.0 % Final  . MCV 01/31/2015 90.3  80.0 - 100.0 fL Final  . MCH 01/31/2015 29.5  26.0 - 34.0 pg Final  . MCHC 01/31/2015 32.7  32.0 - 36.0 g/dL Final  . RDW 01/31/2015 13.0  11.5 - 14.5 % Final  . Platelets 01/31/2015 390  150 - 440 K/uL Final  . Neutrophils Relative % 01/31/2015 93   Final  . Neutro Abs 01/31/2015 8.1* 1.4 - 6.5 K/uL Final  . Lymphocytes Relative 01/31/2015 6   Final  . Lymphs Abs 01/31/2015 0.5* 1.0 - 3.6 K/uL Final  . Monocytes Relative 01/31/2015 1   Final  . Monocytes Absolute 01/31/2015 0.1* 0.2 - 0.9 K/uL Final  . Eosinophils Relative 01/31/2015 0   Final  . Eosinophils Absolute 01/31/2015 0.0  0 - 0.7 K/uL Final  . Basophils Relative 01/31/2015 0   Final  . Basophils Absolute 01/31/2015 0.0  0 - 0.1 K/uL Final  . Sodium 01/31/2015 139  135 - 145 mmol/L Final  . Potassium 01/31/2015 4.0  3.5 - 5.1 mmol/L Final  . Chloride 01/31/2015 102  101 - 111 mmol/L Final  . CO2 01/31/2015 23  22 - 32 mmol/L Final  . Glucose, Bld 01/31/2015 199* 65 - 99 mg/dL Final  . BUN 01/31/2015 20  6 - 20 mg/dL Final  . Creatinine, Ser 01/31/2015 0.83  0.44 - 1.00 mg/dL Final  . Calcium 01/31/2015 9.2  8.9 - 10.3 mg/dL Final  . Total Protein 01/31/2015 6.8  6.5 - 8.1 g/dL Final  . Albumin 01/31/2015 3.8  3.5 - 5.0 g/dL Final  . AST 01/31/2015 38  15 - 41 U/L Final  . ALT 01/31/2015 15  14 - 54 U/L Final  . Alkaline Phosphatase 01/31/2015 72  38 - 126 U/L Final  . Total Bilirubin 01/31/2015 0.5  0.3 - 1.2 mg/dL Final  . GFR calc non Af Amer 01/31/2015 >60  >  60 mL/min Final  . GFR calc Af Amer 01/31/2015 >60  >60 mL/min  Final   Comment: (NOTE) The eGFR has been calculated using the CKD EPI equation. This calculation has not been validated in all clinical situations. eGFR's persistently <60 mL/min signify possible Chronic Kidney Disease.   . Anion gap 01/31/2015 14  5 - 15 Final     STUDIES: Nm Cardiac Muga Rest  01/24/2015   CLINICAL DATA:  Cardiomyopathy due to chemotherapy, high risk chemotherapy utilization  EXAM: NUCLEAR MEDICINE CARDIAC BLOOD POOL IMAGING (MUGA)  TECHNIQUE: Cardiac multi-gated acquisition was performed at rest following intravenous injection of Tc-58mlabeled red blood cells.  RADIOPHARMACEUTICALS:  20.97 mCi Tc-934mn-vitro labeled autologous red blood cells IV  COMPARISON:  None  FINDINGS: LEFT ventricular ejection fraction is calculated at 67%, which is within the normal range.  Patient was rhythmic during imaging, with study performed at a heart rate of 78 bpm.  Cine analysis of the gated blood pool in 3 projections demonstrates normal LEFT ventricular wall motion.  IMPRESSION: Normal LEFT ventricular ejection fraction is 67% with normal LV wall motion.   Electronically Signed   By: MaLavonia Dana.D.   On: 01/24/2015 14:42   Nm Sentinel Node Inj-no Rpt (breast)  01/16/2015   CLINICAL DATA: breast cancer   Sulfur colloid was injected intradermally by the nuclear medicine  technologist for breast cancer sentinel node localization.    Mm Breast Surgical Specimen  01/08/2015   CLINICAL DATA:  Post left breast surgical excision for malignancy.  EXAM: SPECIMEN RADIOGRAPH OF THE LEFT BREAST  COMPARISON:  Previous exam(s).  FINDINGS: Status post excision of the left breast. The 2 wire tips and the 3 biopsy marker clips are present in the surgical specimen. The findings were discussed with the surgeon.  IMPRESSION: Specimen radiograph of the left breast.   Electronically Signed   By: RaAltamese Cabal.D.   On: 01/08/2015 11:34   Dg Chest Port 1 View  01/16/2015   CLINICAL DATA:  Port-A-Cath  placement  EXAM: DG C-ARM 1-60 MIN - NRPT [facility]; PORTABLE CHEST - 1 VIEW  COMPARISON:  None.  FINDINGS: Port-A-Cath tip is at the cavoatrial junction. No pneumothorax. Lungs are clear. Heart size and pulmonary vascularity are normal. There is no demonstrable thoracic adenopathy.  IMPRESSION: No edema or consolidation. Port-A-Cath tip at cavoatrial junction level. No pneumothorax.   Electronically Signed   By: WiLowella GripII M.D.   On: 01/16/2015 13:18   Dg C-arm 1-60 Min-no Report  01/16/2015   CLINICAL DATA: port a cath   C-ARM 1-60 MINUTES  Fluoroscopy was utilized by the requesting physician.  No radiographic  interpretation.    Mm Lt Plc Breast Loc Dev   1st Lesion  Inc Mammo Guide  01/08/2015   CLINICAL DATA:  Preoperative localization of the left breast mass prior to surgical excision.  EXAM: NEEDLE LOCALIZATION OF THE LEFT BREAST WITH MAMMO GUIDANCE  COMPARISON:  Previous exams.  FINDINGS: Patient presents for needle localization prior to surgical excision of left breast mass. I met with the patient and we discussed the procedure of needle localization including benefits and alternatives. We discussed the high likelihood of a successful procedure. We discussed the risks of the procedure, including infection, bleeding, tissue injury, and further surgery. Informed, written consent was given. The usual time-out protocol was performed immediately prior to the procedure.  Prior to the procedure, the case was discussed with the attending surgeon who requests that the  3 clips be bracketed.  Using mammographic guidance, sterile technique, 2% lidocaine, a 5 cm modified Kopan's needle and a second 7 cm modified Kopans needle, the 3 clips were bracketed utilizing a medial approach. The images were marked for Dr. Bary Castilla.  IMPRESSION: Needle localization left breast. No apparent complications.   Electronically Signed   By: Altamese Cabal M.D.   On: 01/08/2015 12:39    ASSESSMENT:  33 -year-old lady  with a history of carcinoma breast.  Stage  0 tumor carcinoma in situ Status post biopsy. right hip pain Previous history of meningioma treated with radiation therapy We will proceed with chemotherapy with [facility]     PLAN:   All lab data has been reviewed All the side effects of chemotherapy including myelosuppression, alopecia, nausea vomiting fatigue weakness.  Secondary infection, and   peripheral neuropathy .  Has been discussed in details. Informal consent has been obtained and will be documented by nurses in the chart Intent of chemotherapy   is   Cure Proceed  with chemotherapy   DCIS (ductal carcinoma in situ) of breast   Staging form: Breast, AJCC 7th Edition     Clinical: Stage 0 (Tis (DCIS), N0, M0) - Marni Griffon, MD   02/03/2015 9:30 AM

## 2015-02-07 ENCOUNTER — Inpatient Hospital Stay: Payer: Medicare Other

## 2015-02-07 DIAGNOSIS — C773 Secondary and unspecified malignant neoplasm of axilla and upper limb lymph nodes: Principal | ICD-10-CM

## 2015-02-07 DIAGNOSIS — C50912 Malignant neoplasm of unspecified site of left female breast: Secondary | ICD-10-CM

## 2015-02-07 DIAGNOSIS — Z5111 Encounter for antineoplastic chemotherapy: Secondary | ICD-10-CM | POA: Diagnosis not present

## 2015-02-07 LAB — CBC WITH DIFFERENTIAL/PLATELET
Band Neutrophils: 13 % — ABNORMAL HIGH (ref 0–10)
Basophils Relative: 0 %
Blasts: 0 %
EOS PCT: 0 %
HEMATOCRIT: 41.6 % (ref 35.0–47.0)
Hemoglobin: 13.3 g/dL (ref 12.0–16.0)
Lymphocytes Relative: 8 %
MCH: 28.7 pg (ref 26.0–34.0)
MCHC: 31.9 g/dL — ABNORMAL LOW (ref 32.0–36.0)
MCV: 89.9 fL (ref 80.0–100.0)
METAMYELOCYTES PCT: 2 %
MYELOCYTES: 6 %
Monocytes Relative: 9 %
Neutrophils Relative %: 62 %
PROMYELOCYTES ABS: 0 %
Platelets: 290 10*3/uL (ref 150–440)
RBC: 4.63 MIL/uL (ref 3.80–5.20)
RDW: 13.7 % (ref 11.5–14.5)
WBC: 40.3 10*3/uL — ABNORMAL HIGH (ref 3.6–11.0)
nRBC: 0 /100 WBC

## 2015-02-07 LAB — COMPREHENSIVE METABOLIC PANEL
ALBUMIN: 4 g/dL (ref 3.5–5.0)
ALK PHOS: 130 U/L — AB (ref 38–126)
ALT: 20 U/L (ref 14–54)
ANION GAP: 6 (ref 5–15)
AST: 24 U/L (ref 15–41)
BUN: 15 mg/dL (ref 6–20)
CALCIUM: 8.1 mg/dL — AB (ref 8.9–10.3)
CO2: 28 mmol/L (ref 22–32)
Chloride: 98 mmol/L — ABNORMAL LOW (ref 101–111)
Creatinine, Ser: 0.85 mg/dL (ref 0.44–1.00)
GFR calc Af Amer: >60 mL/min (ref >60)
Glucose, Bld: 106 mg/dL — ABNORMAL HIGH (ref 65–99)
Potassium: 4.4 mmol/L (ref 3.5–5.1)
Sodium: 132 mmol/L — ABNORMAL LOW (ref 135–145)
TOTAL PROTEIN: 6.7 g/dL (ref 6.5–8.1)
Total Bilirubin: 0.4 mg/dL (ref 0.3–1.2)

## 2015-02-13 ENCOUNTER — Other Ambulatory Visit: Payer: Self-pay | Admitting: *Deleted

## 2015-02-13 DIAGNOSIS — C773 Secondary and unspecified malignant neoplasm of axilla and upper limb lymph nodes: Principal | ICD-10-CM

## 2015-02-13 DIAGNOSIS — C50912 Malignant neoplasm of unspecified site of left female breast: Secondary | ICD-10-CM

## 2015-02-14 ENCOUNTER — Encounter: Payer: Self-pay | Admitting: Oncology

## 2015-02-14 ENCOUNTER — Ambulatory Visit
Admission: RE | Admit: 2015-02-14 | Discharge: 2015-02-14 | Disposition: A | Discharge status: Home / Self Care | Payer: Medicare Other | Source: Ambulatory Visit | Attending: Oncology | Admitting: Oncology

## 2015-02-14 ENCOUNTER — Telehealth: Payer: Self-pay | Admitting: Oncology

## 2015-02-14 ENCOUNTER — Inpatient Hospital Stay (HOSPITAL_BASED_OUTPATIENT_CLINIC_OR_DEPARTMENT_OTHER): Payer: Medicare Other | Admitting: Oncology

## 2015-02-14 ENCOUNTER — Inpatient Hospital Stay: Payer: Medicare Other

## 2015-02-14 ENCOUNTER — Other Ambulatory Visit: Payer: Self-pay | Admitting: Oncology

## 2015-02-14 VITALS — BP 122/76 | HR 98 | Temp 97.0°F | Wt 143.1 lb

## 2015-02-14 DIAGNOSIS — C773 Secondary and unspecified malignant neoplasm of axilla and upper limb lymph nodes: Principal | ICD-10-CM

## 2015-02-14 DIAGNOSIS — M25551 Pain in right hip: Secondary | ICD-10-CM | POA: Diagnosis present

## 2015-02-14 DIAGNOSIS — M1611 Unilateral primary osteoarthritis, right hip: Secondary | ICD-10-CM | POA: Diagnosis not present

## 2015-02-14 DIAGNOSIS — M1711 Unilateral primary osteoarthritis, right knee: Secondary | ICD-10-CM | POA: Diagnosis not present

## 2015-02-14 DIAGNOSIS — Z171 Estrogen receptor negative status [ER-]: Secondary | ICD-10-CM | POA: Diagnosis not present

## 2015-02-14 DIAGNOSIS — C50912 Malignant neoplasm of unspecified site of left female breast: Secondary | ICD-10-CM | POA: Insufficient documentation

## 2015-02-14 DIAGNOSIS — Z79899 Other long term (current) drug therapy: Secondary | ICD-10-CM

## 2015-02-14 DIAGNOSIS — Z5111 Encounter for antineoplastic chemotherapy: Secondary | ICD-10-CM | POA: Diagnosis not present

## 2015-02-14 DIAGNOSIS — D0512 Intraductal carcinoma in situ of left breast: Secondary | ICD-10-CM | POA: Diagnosis not present

## 2015-02-14 DIAGNOSIS — M79604 Pain in right leg: Secondary | ICD-10-CM | POA: Diagnosis present

## 2015-02-14 LAB — CBC WITH DIFFERENTIAL/PLATELET
BASOS ABS: 0 10*3/uL (ref 0–0.1)
BASOS PCT: 0 %
EOS PCT: 0 %
Eosinophils Absolute: 0 10*3/uL (ref 0–0.7)
HCT: 36.4 % (ref 35.0–47.0)
Hemoglobin: 11.8 g/dL — ABNORMAL LOW (ref 12.0–16.0)
Lymphocytes Relative: 9 %
Lymphs Abs: 1 10*3/uL (ref 1.0–3.6)
MCH: 29.2 pg (ref 26.0–34.0)
MCHC: 32.5 g/dL (ref 32.0–36.0)
MCV: 89.7 fL (ref 80.0–100.0)
MONO ABS: 0.7 10*3/uL (ref 0.2–0.9)
MONOS PCT: 6 %
Neutro Abs: 9.2 10*3/uL — ABNORMAL HIGH (ref 1.4–6.5)
Neutrophils Relative %: 85 %
Platelets: 227 10*3/uL (ref 150–440)
RBC: 4.06 MIL/uL (ref 3.80–5.20)
RDW: 13.5 % (ref 11.5–14.5)
WBC: 11 10*3/uL (ref 3.6–11.0)

## 2015-02-14 MED ORDER — HEPARIN SOD (PORK) LOCK FLUSH 100 UNIT/ML IV SOLN
INTRAVENOUS | Status: AC
  Filled 2015-02-14: qty 5

## 2015-02-14 MED ORDER — SODIUM CHLORIDE 0.9 % IJ SOLN
10.0000 mL | Freq: Once | INTRAMUSCULAR | Status: AC
  Administered 2015-02-14: 10 mL via INTRAVENOUS
  Filled 2015-02-14: qty 10

## 2015-02-14 MED ORDER — KETOROLAC TROMETHAMINE 15 MG/ML IJ SOLN
15.0000 mg | Freq: Once | INTRAMUSCULAR | Status: AC
  Administered 2015-02-14: 15 mg via INTRAVENOUS
  Filled 2015-02-14: qty 1

## 2015-02-14 MED ORDER — GABAPENTIN 300 MG PO CAPS: 300.0000 mg | ORAL_CAPSULE | Freq: Every day | ORAL | Status: DC

## 2015-02-14 MED ORDER — HEPARIN SOD (PORK) LOCK FLUSH 100 UNIT/ML IV SOLN
500.0000 [IU] | Freq: Once | INTRAVENOUS | Status: AC
  Administered 2015-02-14: 500 [IU] via INTRAVENOUS

## 2015-02-14 MED ORDER — HYDROCODONE-ACETAMINOPHEN 5-325 MG PO TABS: 1.0000 | ORAL_TABLET | Freq: Once | ORAL | Status: DC

## 2015-02-14 NOTE — Progress Notes (Signed)
[place] @ [facility] [phone]  Fax:([phone] Fisher Zolman OB: [birth year]  MR#: [id number]  NOM#:[id number]  Patient Care Team: [name], [name], MD (General Surgery) Self Requested  CHIEF COMPLAINT:  Chief Complaint  Patient presents with  . Follow-up    VISIT DIAGNOSIS:     ICD-9-CM ICD-10-CM   1. Breast cancer metastasized to axillary lymph node, left 174.9 C50.912 gabapentin (NEURONTIN) 300 MG capsule   196.3 C77.3 HYDROcodone-acetaminophen (NORCO/VICODIN) 5-325 MG per tablet     MR Lumbar Spine W Wo Contrast     MR Sacrum/SI Joints W Wo Contrast     MR Hip Right W Wo Contrast     DG FEMUR, MIN 2 VIEWS RIGHT     CANCELED: DG Pelvis Comp Min 3V     Oncology History   1.  Abnormal mammogram of the left breast.  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  Progesterone receptor negative.  HER-2/neu receptor positive 2.  Patient is starting [facility] from July OF 2016     Breast cancer metastasized to axillary lymph node   01/24/2015 Initial Diagnosis Breast cancer metastasized to axillary lymph node    Oncology Flowsheet 01/08/2015 01/16/2015 01/31/2015  Day, Cycle - - Day 1, Cycle 1  CARBOplatin (PARAPLATIN) IV - - 470 mg  dexamethasone (DECADRON) IJ - - -  dexamethasone (DECADRON) IV - - [ 12 mg ]  diphenhydrAMINE (BENADRYL) PO - - 50 mg  DOCEtaxel (TAXOTERE) IV - - 70 mg/m2  fosaprepitant (EMEND) IV - - [ 150 mg ]  ondansetron (ZOFRAN) IV - - -  palonosetron (ALOXI) IV - - 0.25 mg  pegfilgrastim (NEULASTA ONPRO KIT) [place] - - 6 mg  trastuzumab (HERCEPTIN) IV - - 8 mg/kg    INTERVAL HISTORY:  74 year old lady who had a regular mammogram recent mammogram with abnormal ultrasound was done.  There were  3 nodules were found.  Patient underwent stereotactic ultrasound-guided biopsy.  3 nodules were 4 cm 5 cm in 6 cm away from nipple.  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  Slightly apprehensive but not any acute distress January 31, 2015 Patient is here for ongoing evaluation and treatment consideration.  She had a port placement done.  Patient has undergone chemotherapy class and also MUGA scan of the heart.  She was seen by orthopedic surgeon and they did not want to consider surgical intervention at present time February 14, 2015 Patient came today with acute onset of right hip pain.  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  She was doing mild exercise and started severe shooting pain low back pain radiating down to right lower extremity. Patient is in mild distress because of pain REVIEW OF SYSTEMS:   GENERAL:   Significant distress because of pain PERFORMANCE STATUS (ECOG): 0  HEENT:  No visual changes, runny nose, sore throat, mouth sores or tenderness. Lungs: No shortness of breath or cough.  No hemoptysis. Cardiac:  No chest pain, palpitations, orthopnea, or PND. GI:  No nausea, vomiting, diarrhea, constipation, melena or hematochezia. GU:  No urgency, frequency, dysuria, or  hematuria. Prior use of birth control pills several years ago.  Did not use any estrogen or any other hormone replacement therapy Musculoskeletal: Right hip pain .    Skin:  No rashes or skin changes. Neuro:  No headache, numbness or weakness, balance or coordination issues. Endocrine:  No diabetes, thyroid issues, hot flashes or night sweats. Psych:  No mood changes, depression or anxiety. Pain:  Severe right hip pain and pain in the low back area radiating down to right lower extremity Review of systems:  All other systems reviewed  and found to be negative. As per HPI. Otherwise, a complete review of systems is negatve.  PAST MEDICAL HISTORY: Past Medical History  Diagnosis Date  . 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 Hearing loss   . Memory impairment     seen by Dr Manuella Ghazi  possible post crainiotomy from radiation  . 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    PAST SURGICAL HISTORY: Past Surgical History  Procedure Laterality Date  . Appendectomy  1950  . Brain surgery  1995    left frontal/temporal  . Colonoscopy  2010    Dr. [name]  . Breast biopsy Left 1997  . 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 Breast biopsy Left 01/08/2015    Procedure: BREAST BIOPSY WITH NEEDLE LOCALIZATION;  Surgeon: [name], MD;  Location: [facility] ORS;  Service: General;  Laterality: Left;  . Breast lumpectomy Left 01/08/2015    Procedure: LUMPECTOMY;  Surgeon: [name], MD;  Location: [facility] ORS;  Service: General;  Laterality: Left;  . Sentinel node biopsy Left 01/16/2015    Procedure: SENTINEL NODE BIOPSY;  Surgeon: [name], MD;  Location: [facility] ORS;  Service: General;  Laterality: Left;  . Portacath placement Right 01/16/2015    Procedure: INSERTION PORT-A-CATH;  Surgeon: [name], MD;  Location: [facility] ORS;  Service: General;  Laterality: Right;    FAMILY HISTORY Family History  Problem Relation Age of Onset  . Breast cancer Sister 58  . Addison's disease Mother   . Stroke Father            HEALTH MAINTENANCE: History  Substance Use Topics  . Smoking status: Former Smoker    Quit date: 12/28/1964  . Smokeless tobacco: Never Used  . Alcohol Use: 0.6 - 1.2 oz/week    1-2 Glasses of wine per week     Comment: 1 Glass Wine / Night      No Known Allergies  Current Outpatient Prescriptions  Medication Sig Dispense Refill  . Ascorbic Acid (VITAMIN C)  100 MG tablet Take 100 mg by mouth every morning.     . Calcium Carbonate-Vit D-Min (CALTRATE 600+D PLUS MINERALS) 600-800 MG-UNIT TABS Take 1 tablet by mouth every morning.    . calcium-vitamin D 250-100 MG-UNIT per tablet Take 1 tablet by mouth 1 day or 1 dose. 600 mg    . dexamethasone (DECADRON) 4 MG tablet Take 2 tablets (8 mg total) by mouth 2 (two) times daily. Start the day before Taxotere. Then again the day after chemo. 30 tablet 1  . lidocaine-prilocaine (EMLA) cream Apply 1 application topically as needed. 30 g 3  . LORazepam (ATIVAN) 0.5 MG tablet Take 1 tablet (0.5 mg total) by mouth every 6 (six) hours as needed (Nausea or vomiting). 30 tablet 0  .  meloxicam (MOBIC) 7.5 MG tablet Take 7.5 mg by mouth every morning.     . ondansetron (ZOFRAN) 8 MG tablet Take 1 tablet (8 mg total) by mouth 2 (two) times daily. Start the day after chemo for 3 days. Then take as needed for nausea or vomiting. 30 tablet 1  . traMADol (ULTRAM) 50 MG tablet Take by mouth 3 (three) times daily.     . Triprolidine-Pseudoephedrine (ANTIHISTAMINE PO) Take by mouth every morning.     . gabapentin (NEURONTIN) 300 MG capsule Take 1 capsule (300 mg total) by mouth at bedtime. 30 capsule 3  . HYDROcodone-acetaminophen (NORCO/VICODIN) 5-325 MG per tablet Take 1 tablet by mouth once. 40 tablet 0   No current facility-administered medications for this visit.    OBJECTIVE: PHYSICAL EXAM: Gen. status: Patient is in moderate distress because of right hip pain. Musculoskeletal system tenderness in the right hip and low back area Lungs clear Heart tachycardia Lower extremity no swelling Movement of the right hip is painful no other joint swelling  Filed Vitals:   02/14/15 1153  BP: 122/76  Pulse: 98  Temp: 97 F (36.1 C)     Body mass index is 24.55 kg/(m^2).    ECOG FS:1 - Symptomatic but completely ambulatory  LAB RESULTS:  Appointment on 02/14/2015  Component Date Value Ref Range Status  . WBC  02/14/2015 11.0  3.6 - 11.0 K/uL Final  . RBC 02/14/2015 4.06  3.80 - 5.20 MIL/uL Final  . Hemoglobin 02/14/2015 11.8* 12.0 - 16.0 g/dL Final  . HCT 02/14/2015 36.4  35.0 - 47.0 % Final  . MCV 02/14/2015 89.7  80.0 - 100.0 fL Final  . MCH 02/14/2015 29.2  26.0 - 34.0 pg Final  . MCHC 02/14/2015 32.5  32.0 - 36.0 g/dL Final  . RDW 02/14/2015 13.5  11.5 - 14.5 % Final  . Platelets 02/14/2015 227  150 - 440 K/uL Final  . Neutrophils Relative % 02/14/2015 85   Final  . Neutro Abs 02/14/2015 9.2* 1.4 - 6.5 K/uL Final  . Lymphocytes Relative 02/14/2015 9   Final  . Lymphs Abs 02/14/2015 1.0  1.0 - 3.6 K/uL Final  . Monocytes Relative 02/14/2015 6   Final  . Monocytes Absolute 02/14/2015 0.7  0.2 - 0.9 K/uL Final  . Eosinophils Relative 02/14/2015 0   Final  . Eosinophils Absolute 02/14/2015 0.0  0 - 0.7 K/uL Final  . Basophils Relative 02/14/2015 0   Final  . Basophils Absolute 02/14/2015 0.0  0 - 0.1 K/uL Final     STUDIES: Dg Pelvis 1-2 Views  02/14/2015   CLINICAL DATA:  RIGHT hip and femur pain for several months now severe over past couple days, history metastatic breast cancer  EXAM: PELVIS - 1-2 VIEW  COMPARISON:  None.  FINDINGS: Diffuse osseous demineralization.  Marked joint space narrowing of the RIGHT hip joint with bone-on-bone appearance, subchondral sclerosis and subchondral cyst formation.  Minimal narrowing of LEFT hip joint.  SI joints symmetric.  No acute fracture or dislocation.  No sclerotic or destructive bone lesions identified to suggest osseous metastatic disease.  Multilevel degenerative disc and facet disease change at visualized lower lumbar spine.  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  IMPRESSION: Osseous demineralization with advanced osteoarthritic changes of RIGHT hip joint.  Degenerative disc and facet disease changes at visualized lower lumbar spine.   Electronically Signed   By: Lavonia Dana M.D.   On: 02/14/2015 14:45   Nm Cardiac  Muga Rest  01/24/2015   CLINICAL DATA:  Cardiomyopathy due to chemotherapy, high risk chemotherapy utilization  EXAM: NUCLEAR MEDICINE CARDIAC BLOOD POOL IMAGING (MUGA)  TECHNIQUE: Cardiac multi-gated acquisition was performed at rest following intravenous injection of Tc-69mlabeled red blood cells.  RADIOPHARMACEUTICALS:  20.97 mCi Tc-949mn-vitro labeled autologous red blood cells IV  COMPARISON:  None  FINDINGS: LEFT ventricular ejection fraction is calculated at 67%, which is within the normal range.  Patient was rhythmic during imaging, with study performed at a heart rate of 78 bpm.  Cine analysis of the gated blood pool in 3 projections demonstrates normal LEFT ventricular wall motion.  IMPRESSION: Normal LEFT ventricular ejection fraction is 67% with normal LV wall motion.   Electronically Signed   By: MaLavonia Dana.D.   On: 01/24/2015 14:42   Nm Sentinel Node Inj-no Rpt (breast)  01/16/2015   CLINICAL DATA: breast cancer   Sulfur colloid was injected intradermally by the nuclear medicine  technologist for breast cancer sentinel node localization.    Dg Chest Port 1 View  01/16/2015   CLINICAL DATA:  Port-A-Cath placement  EXAM: DG C-ARM 1-60 MIN - NRPT [facility]; PORTABLE CHEST - 1 VIEW  COMPARISON:  None.  FINDINGS: Port-A-Cath tip is at the cavoatrial junction. No pneumothorax. Lungs are clear. Heart size and pulmonary vascularity are normal. There is no demonstrable thoracic adenopathy.  IMPRESSION: No edema or consolidation. Port-A-Cath tip at cavoatrial junction level. No pneumothorax.   Electronically Signed   By: WiLowella GripII M.D.   On: 01/16/2015 13:18   Dg C-arm 1-60 Min-no Report  01/16/2015   CLINICAL DATA: port a cath   C-ARM 1-60 MINUTES  Fluoroscopy was utilized by the requesting physician.  No radiographic  interpretation.    Dg Femur, Min 2 Views Right  02/14/2015   CLINICAL DATA:  RIGHT hip and femoral pain for several months now severe in past couple days, history  metastatic breast cancer  EXAM: RIGHT FEMUR 2 VIEWS  COMPARISON:  None  FINDINGS: Osseous demineralization.  Marked joint space narrowing of the RIGHT hip joint with minimal subchondral sclerosis and cyst formation and spur formation.  No acute fracture, dislocation or bone destruction.  Joint space narrowing of the RIGHT knee identified with minimal chondrocalcinosis.  Visualized pelvis intact.  No knee joint effusion or regional soft tissue abnormalities.  IMPRESSION: Osseous demineralization.  Degenerative changes of RIGHT hip and RIGHT knee joints as above.   Electronically Signed   By: MaLavonia Dana.D.   On: 02/14/2015 14:46    ASSESSMENT:  [age]year-old lady with a history of carcinoma breast.  Stage  0 tumor carcinoma in situ Status post biopsy. right hip pain Previous history of meningioma treated with radiation therapy [facility] Severe acute onset of right hip pain. Intravenous Toradol Continue tramadol and meloxicam Vicodin was given when necessary for severe pain Plain x-rays were reviewed and shows osteoporosis no evidence of fracture  Possibility of pain clinic appointment for control pain while patient is on chemotherapy I will discuss situation with orthopedic surgeon for possibility of steroid injection in the right hip area for temporary relief in the pain  DCIS (ductal carcinoma in situ) of breast   Staging form: Breast, AJCC 7th Edition     Clinical: Stage 0 (Tis (DCIS), N0, M0) - Unsigned   JaForest GleasonMD   02/14/2015 5:25 PM

## 2015-02-14 NOTE — Progress Notes (Signed)
Patient does have living will.  Former smoker.  Patient acute add on today for right hip pain.  Started @ coccyx on Sunday and now radiating into hip.  Very painful when rising from sitting position and putting her weight on that side.  Patient alternated heat/cold packs.  No much relief.

## 2015-02-15 ENCOUNTER — Telehealth: Payer: Self-pay | Admitting: *Deleted

## 2015-02-15 NOTE — Telephone Encounter (Signed)
-----   Message from [name], MD sent at 02/14/2015  5:07 PM EDT ----- Regarding: x rays  Call and tell Patient there was no fracture is all osteoporosis arthritis Find out how was pain?

## 2015-02-15 NOTE — Telephone Encounter (Signed)
Called and spoke with Dr. Tobe Sos who states patient slept well last night.  Pain is 50% better.  Relayed to him there are no fractures and X-ray showed only arthritis/osteoporosis.  Verbalized understanding.  Instructed pt to call if there are further problems or concerns.

## 2015-02-15 NOTE — Telephone Encounter (Signed)
Will call patient.

## 2015-02-19 ENCOUNTER — Other Ambulatory Visit: Payer: Self-pay | Admitting: *Deleted

## 2015-02-19 ENCOUNTER — Telehealth: Payer: Self-pay | Admitting: *Deleted

## 2015-02-19 DIAGNOSIS — C50912 Malignant neoplasm of unspecified site of left female breast: Secondary | ICD-10-CM

## 2015-02-19 DIAGNOSIS — C773 Secondary and unspecified malignant neoplasm of axilla and upper limb lymph nodes: Principal | ICD-10-CM

## 2015-02-19 NOTE — Telephone Encounter (Signed)
Question if the order for MRI of sacrum and right hip can be changed to mri of pelvis. Per MD, order will be changed to MRI of pelvis with/without contrast.

## 2015-02-19 NOTE — Telephone Encounter (Signed)
Has a question concerning the order for lumbar sacrum and right hip with and without contrast. I'm just looking ahead and thinking about pt's comfort and what the physician may be looking for. Please call. Pt is coming to our mobile unit out in [place].Marland KitchenMarland Kitchen

## 2015-02-20 ENCOUNTER — Ambulatory Visit
Admission: RE | Admit: 2015-02-20 | Discharge: 2015-02-20 | Disposition: A | Discharge status: Home / Self Care | Payer: Medicare Other | Source: Ambulatory Visit | Attending: Oncology | Admitting: Oncology

## 2015-02-20 DIAGNOSIS — C773 Secondary and unspecified malignant neoplasm of axilla and upper limb lymph nodes: Principal | ICD-10-CM

## 2015-02-20 DIAGNOSIS — C50912 Malignant neoplasm of unspecified site of left female breast: Secondary | ICD-10-CM

## 2015-02-20 MED ORDER — GADOBENATE DIMEGLUMINE 529 MG/ML IV SOLN: 15.0000 mL | Freq: Once | INTRAVENOUS | Status: AC | PRN

## 2015-02-21 ENCOUNTER — Encounter: Payer: Self-pay | Admitting: Oncology

## 2015-02-21 ENCOUNTER — Inpatient Hospital Stay: Payer: Medicare Other

## 2015-02-21 ENCOUNTER — Inpatient Hospital Stay (HOSPITAL_BASED_OUTPATIENT_CLINIC_OR_DEPARTMENT_OTHER): Payer: Medicare Other | Admitting: Oncology

## 2015-02-21 VITALS — BP 121/74 | HR 90 | Temp 96.6°F | Wt 143.5 lb

## 2015-02-21 VITALS — BP 124/78 | HR 84 | Temp 97.1°F | Resp 18

## 2015-02-21 DIAGNOSIS — C50912 Malignant neoplasm of unspecified site of left female breast: Secondary | ICD-10-CM

## 2015-02-21 DIAGNOSIS — M1611 Unilateral primary osteoarthritis, right hip: Secondary | ICD-10-CM

## 2015-02-21 DIAGNOSIS — Z171 Estrogen receptor negative status [ER-]: Secondary | ICD-10-CM

## 2015-02-21 DIAGNOSIS — M25551 Pain in right hip: Secondary | ICD-10-CM

## 2015-02-21 DIAGNOSIS — Z79899 Other long term (current) drug therapy: Secondary | ICD-10-CM

## 2015-02-21 DIAGNOSIS — C773 Secondary and unspecified malignant neoplasm of axilla and upper limb lymph nodes: Principal | ICD-10-CM

## 2015-02-21 DIAGNOSIS — Z5111 Encounter for antineoplastic chemotherapy: Secondary | ICD-10-CM | POA: Diagnosis not present

## 2015-02-21 DIAGNOSIS — D0512 Intraductal carcinoma in situ of left breast: Secondary | ICD-10-CM | POA: Diagnosis not present

## 2015-02-21 LAB — COMPREHENSIVE METABOLIC PANEL
ALK PHOS: 62 U/L (ref 38–126)
ALT: 20 U/L (ref 14–54)
ANION GAP: 7 (ref 5–15)
AST: 28 U/L (ref 15–41)
Albumin: 3.6 g/dL (ref 3.5–5.0)
BUN: 13 mg/dL (ref 6–20)
CHLORIDE: 101 mmol/L (ref 101–111)
CO2: 27 mmol/L (ref 22–32)
Calcium: 8.3 mg/dL — ABNORMAL LOW (ref 8.9–10.3)
Creatinine, Ser: 0.73 mg/dL (ref 0.44–1.00)
GFR calc non Af Amer: >60 mL/min (ref >60)
Glucose, Bld: 118 mg/dL — ABNORMAL HIGH (ref 65–99)
Potassium: 3.9 mmol/L (ref 3.5–5.1)
Sodium: 135 mmol/L (ref 135–145)
TOTAL PROTEIN: 6.4 g/dL — AB (ref 6.5–8.1)
Total Bilirubin: 0.6 mg/dL (ref 0.3–1.2)

## 2015-02-21 LAB — CBC WITH DIFFERENTIAL/PLATELET
BASOS ABS: 0 10*3/uL (ref 0–0.1)
BASOS PCT: 0 %
EOS ABS: 0.4 10*3/uL (ref 0–0.7)
Eosinophils Relative: 6 %
HEMATOCRIT: 35.6 % (ref 35.0–47.0)
Hemoglobin: 11.6 g/dL — ABNORMAL LOW (ref 12.0–16.0)
Lymphocytes Relative: 12 %
Lymphs Abs: 0.7 10*3/uL — ABNORMAL LOW (ref 1.0–3.6)
MCH: 29.1 pg (ref 26.0–34.0)
MCHC: 32.4 g/dL (ref 32.0–36.0)
MCV: 89.5 fL (ref 80.0–100.0)
MONO ABS: 0.5 10*3/uL (ref 0.2–0.9)
Monocytes Relative: 9 %
Neutro Abs: 4.5 10*3/uL (ref 1.4–6.5)
Neutrophils Relative %: 73 %
Platelets: 354 10*3/uL (ref 150–440)
RBC: 3.98 MIL/uL (ref 3.80–5.20)
RDW: 13.5 % (ref 11.5–14.5)
WBC: 6.2 10*3/uL (ref 3.6–11.0)

## 2015-02-21 LAB — MAGNESIUM: MAGNESIUM: 2 mg/dL (ref 1.7–2.4)

## 2015-02-21 MED ORDER — PEGFILGRASTIM 6 MG/0.6ML ~~LOC~~ PSKT
6.0000 mg | PREFILLED_SYRINGE | Freq: Once | SUBCUTANEOUS | Status: AC
  Administered 2015-02-21: 6 mg via SUBCUTANEOUS
  Filled 2015-02-21: qty 0.6

## 2015-02-21 MED ORDER — PALONOSETRON HCL INJECTION 0.25 MG/5ML
0.2500 mg | Freq: Once | INTRAVENOUS | Status: AC
  Administered 2015-02-21: 0.25 mg via INTRAVENOUS
  Filled 2015-02-21: qty 5

## 2015-02-21 MED ORDER — SODIUM CHLORIDE 0.9 % IV SOLN
Freq: Once | INTRAVENOUS | Status: AC
  Administered 2015-02-21: 11:00:00 via INTRAVENOUS
  Filled 2015-02-21: qty 5

## 2015-02-21 MED ORDER — SODIUM CHLORIDE 0.9 % IV SOLN
470.0000 mg | Freq: Once | INTRAVENOUS | Status: AC
  Administered 2015-02-21: 470 mg via INTRAVENOUS
  Filled 2015-02-21: qty 47

## 2015-02-21 MED ORDER — ACETAMINOPHEN 325 MG PO TABS
650.0000 mg | ORAL_TABLET | Freq: Once | ORAL | Status: AC
  Administered 2015-02-21: 650 mg via ORAL
  Filled 2015-02-21: qty 2

## 2015-02-21 MED ORDER — DIPHENHYDRAMINE HCL 25 MG PO CAPS
50.0000 mg | ORAL_CAPSULE | Freq: Once | ORAL | Status: AC
  Administered 2015-02-21: 50 mg via ORAL
  Filled 2015-02-21: qty 2

## 2015-02-21 MED ORDER — SODIUM CHLORIDE 0.9 % IV SOLN
Freq: Once | INTRAVENOUS | Status: AC
  Administered 2015-02-21: 10:00:00 via INTRAVENOUS
  Filled 2015-02-21: qty 1000

## 2015-02-21 MED ORDER — SODIUM CHLORIDE 0.9 % IJ SOLN
10.0000 mL | INTRAMUSCULAR | Status: DC | PRN
  Administered 2015-02-21: 10 mL via INTRAVENOUS
  Filled 2015-02-21: qty 10

## 2015-02-21 MED ORDER — TRASTUZUMAB CHEMO INJECTION 440 MG
6.0000 mg/kg | Freq: Once | INTRAVENOUS | Status: AC
  Administered 2015-02-21: 399 mg via INTRAVENOUS
  Filled 2015-02-21: qty 19

## 2015-02-21 MED ORDER — HEPARIN SOD (PORK) LOCK FLUSH 100 UNIT/ML IV SOLN
500.0000 [IU] | Freq: Once | INTRAVENOUS | Status: AC
  Administered 2015-02-21: 500 [IU] via INTRAVENOUS
  Filled 2015-02-21: qty 5

## 2015-02-21 MED ORDER — DOCETAXEL CHEMO INJECTION 160 MG/16ML
70.0000 mg/m2 | Freq: Once | INTRAVENOUS | Status: AC
  Administered 2015-02-21: 120 mg via INTRAVENOUS
  Filled 2015-02-21: qty 12

## 2015-02-21 NOTE — Progress Notes (Signed)
Patient does have living will.  Former smoker.  Patient unable to complete MRI.  Rescheduled for Friday morning.

## 2015-02-21 NOTE — Progress Notes (Signed)
[place] @ [name] [phone]) [phone]  Fax:([phone]) Hedley Standish OB: 1941-07-13  MR#: [id number]  WOE#:[id number]  Patient Care Team: [name], [name], MD (General Surgery) Self Requested  CHIEF COMPLAINT:  Chief Complaint  Patient presents with  . Follow-up    VISIT DIAGNOSIS:     ICD-9-CM ICD-10-CM   1. Breast cancer metastasized to axillary lymph node, left 174.9 C50.912 Comprehensive metabolic panel   003.7 C48.8 Magnesium     Oncology History   1.  Abnormal mammogram of the left breast.  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  Progesterone receptor negative.  HER-2/neu receptor positive 2.  Patient is starting [facility] from July OF 2016     Breast cancer metastasized to axillary lymph node   01/24/2015 Initial Diagnosis Breast cancer metastasized to axillary lymph node    Oncology Flowsheet 01/08/2015 01/16/2015 01/31/2015  Day, Cycle - - Day 1, Cycle 1  CARBOplatin (PARAPLATIN) IV - - 470 mg  dexamethasone (DECADRON) IJ - - -  dexamethasone (DECADRON) IV - - [ 12 mg ]  diphenhydrAMINE (BENADRYL) PO - - 50 mg  DOCEtaxel (TAXOTERE) IV - - 70 mg/m2  fosaprepitant (EMEND) IV - - [ 150 mg ]  ondansetron (ZOFRAN) IV - - -  palonosetron (ALOXI) IV - - 0.25 mg  pegfilgrastim (NEULASTA ONPRO KIT) [place] - - 6 mg  trastuzumab (HERCEPTIN) IV - - 8 mg/kg    INTERVAL HISTORY:  74 year old lady who had a regular mammogram recent mammogram with abnormal ultrasound was done.  There were 3 nodules were found.  Patient underwent stereotactic ultrasound-guided biopsy.  3 nodules were 4 cm 5 cm in 6 cm away from nipple.  And the one nodule at 5 cm revealed focal changes consistent with high-grade ductal   Carcinoma in  situ  Patient has been evaluated by surgeon and here for further evaluation and treatment consideration.  Slightly apprehensive but not any acute distress February 21, 2015 Patient is here for ongoing evaluation and treatment consideration Right hip pain continues to bother her.  Plain x-ray did not reveal any evidence of fracture.  MRI scan could not be done as patient could not stay still on the table.  A no tingling numbness appetite has been fairly good.  No nausea or vomiting.  REVIEW OF SYSTEMS:   GENERAL:   Significant distress because of pain PERFORMANCE STATUS (ECOG): 0  HEENT:  No visual changes, runny nose, sore throat, mouth sores or tenderness. Lungs: No shortness of breath or cough.  No hemoptysis. Cardiac:  No chest pain, palpitations, orthopnea, or PND. GI:  No nausea, vomiting, diarrhea, constipation, melena or hematochezia. GU:  No urgency, frequency, dysuria, or hematuria. Prior use of birth control pills several years ago.  Did not use any estrogen or any other hormone replacement therapy Musculoskeletal: Right hip pain .    Skin:  No rashes or skin changes. Neuro:  No headache, numbness or weakness, balance or coordination issues. Endocrine:  No diabetes, thyroid issues, hot flashes or night sweats. Psych:  No mood changes, depression or anxiety. Pain:  Severe right hip pain and pain in the low back area radiating down to right lower extremity Review of systems:  All other systems reviewed and found to be  negative. As per HPI. Otherwise, a complete review of systems is negatve.  PAST MEDICAL HISTORY: Past Medical History  Diagnosis Date  . 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 Hearing loss   . Memory impairment     seen by Dr Manuella Ghazi  possible post crainiotomy from radiation  . 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    PAST SURGICAL  HISTORY: Past Surgical History  Procedure Laterality Date  . Appendectomy  1950  . Brain surgery  1995    left frontal/temporal  . Colonoscopy  2010    Dr. [name]  . Breast biopsy Left 1997  . 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 Breast biopsy Left 01/08/2015    Procedure: BREAST BIOPSY WITH NEEDLE LOCALIZATION;  Surgeon: [name], MD;  Location: [facility] ORS;  Service: General;  Laterality: Left;  . Breast lumpectomy Left 01/08/2015    Procedure: LUMPECTOMY;  Surgeon: [name], MD;  Location: [facility] ORS;  Service: General;  Laterality: Left;  . Sentinel node biopsy Left 01/16/2015    Procedure: SENTINEL NODE BIOPSY;  Surgeon: [name], MD;  Location: [facility] ORS;  Service: General;  Laterality: Left;  . Portacath placement Right 01/16/2015    Procedure: INSERTION PORT-A-CATH;  Surgeon: [name], MD;  Location: [facility] ORS;  Service: General;  Laterality: Right;    FAMILY HISTORY Family History  Problem Relation Age of Onset  . Breast cancer Sister 8  . Addison's disease Mother   . Stroke Father            HEALTH MAINTENANCE: History  Substance Use Topics  . Smoking status: Former Smoker    Quit date: 12/28/1964  . Smokeless tobacco: Never Used  . Alcohol Use: 0.6 - 1.2 oz/week    1-2 Glasses of wine per week     Comment: 1 Glass Wine / Night      No Known Allergies  Current Outpatient Prescriptions  Medication Sig Dispense Refill  . Ascorbic Acid (VITAMIN C) 100 MG tablet Take 100 mg by mouth every morning.     . Calcium Carbonate-Vit D-Min (CALTRATE 600+D PLUS MINERALS) 600-800 MG-UNIT TABS Take 1 tablet by mouth every morning.    . calcium-vitamin D 250-100 MG-UNIT per tablet Take 1 tablet by mouth 1 day or 1 dose. 600 mg    . dexamethasone (DECADRON) 4 MG tablet Take 2 tablets (8 mg total) by mouth 2 (two) times daily. Start the day before Taxotere. Then again the day after chemo. 30 tablet 1  . gabapentin (NEURONTIN) 300 MG  capsule Take 1 capsule (300 mg total) by mouth at bedtime. 30 capsule 3  . HYDROcodone-acetaminophen (NORCO/VICODIN) 5-325 MG per tablet Take 1 tablet by mouth once. 40 tablet 0  . lidocaine-prilocaine (EMLA) cream Apply 1 application topically as needed. 30 g 3  . LORazepam (ATIVAN) 0.5 MG tablet Take 1 tablet (0.5 mg total) by mouth every 6 (six) hours as needed (Nausea or vomiting). 30 tablet 0  . meloxicam (MOBIC) 7.5 MG tablet Take 7.5 mg by mouth every morning.     . ondansetron (ZOFRAN) 8 MG tablet Take 1 tablet (8 mg total) by mouth 2 (two) times daily. Start the day after chemo for 3 days. Then take as needed for nausea or vomiting. 30 tablet 1  . traMADol (ULTRAM) 50 MG  tablet Take by mouth 3 (three) times daily.     . Triprolidine-Pseudoephedrine (ANTIHISTAMINE PO) Take by mouth every morning.      No current facility-administered medications for this visit.    OBJECTIVE: GENERAL:  Well developed, well nourished, sitting comfortably in the exam room in no acute distress. MENTAL STATUS:  Alert and oriented to person, place and time.  started losing her hair  ENT: No evidence of stomatitis RESPIRATORY:  Clear to auscultation without rales, wheezes or rhonchi. CARDIOVASCULAR:  Regular rate and rhythm without murmur, rub or gallop. Port site within normal limit ABDOMEN:  Soft, non-tender, with active bowel sounds, and no hepatosplenomegaly.  No masses. BACK:  No CVA tenderness.  No tenderness on percussion of the back or rib cage. SKIN:  No rashes, ulcers or lesions. EXTREMITIES: No edema, no skin discoloration or tenderness.  No palpable cords. LYMPH NODES: No palpable cervical, supraclavicular, axillary or inguinal adenopathy  NEUROLOGICAL: Unremarkable. PSYCH:  Appropriate.  Filed Vitals:   02/21/15 0917  BP: 121/74  Pulse: 90  Temp: 96.6 F (35.9 C)     Body mass index is 24.62 kg/(m^2).    ECOG FS:1 - Symptomatic but completely ambulatory  LAB RESULTS:  Appointment  on 02/21/2015  Component Date Value Ref Range Status  . Magnesium 02/21/2015 2.0  1.7 - 2.4 mg/dL Final  . Sodium 02/21/2015 135  135 - 145 mmol/L Final  . Potassium 02/21/2015 3.9  3.5 - 5.1 mmol/L Final  . Chloride 02/21/2015 101  101 - 111 mmol/L Final  . CO2 02/21/2015 27  22 - 32 mmol/L Final  . Glucose, Bld 02/21/2015 118* 65 - 99 mg/dL Final  . BUN 02/21/2015 13  6 - 20 mg/dL Final  . Creatinine, Ser 02/21/2015 0.73  0.44 - 1.00 mg/dL Final  . Calcium 02/21/2015 8.3* 8.9 - 10.3 mg/dL Final  . Total Protein 02/21/2015 6.4* 6.5 - 8.1 g/dL Final  . Albumin 02/21/2015 3.6  3.5 - 5.0 g/dL Final  . AST 02/21/2015 28  15 - 41 U/L Final  . ALT 02/21/2015 20  14 - 54 U/L Final  . Alkaline Phosphatase 02/21/2015 62  38 - 126 U/L Final  . Total Bilirubin 02/21/2015 0.6  0.3 - 1.2 mg/dL Final  . GFR calc non Af Amer 02/21/2015 >60  >60 mL/min Final  . GFR calc Af Amer 02/21/2015 >60  >60 mL/min Final   Comment: (NOTE) The eGFR has been calculated using the CKD EPI equation. This calculation has not been validated in all clinical situations. eGFR's persistently <60 mL/min signify possible Chronic Kidney Disease.   . Anion gap 02/21/2015 7  5 - 15 Final  . WBC 02/21/2015 6.2  3.6 - 11.0 K/uL Final  . RBC 02/21/2015 3.98  3.80 - 5.20 MIL/uL Final  . Hemoglobin 02/21/2015 11.6* 12.0 - 16.0 g/dL Final  . HCT 02/21/2015 35.6  35.0 - 47.0 % Final  . MCV 02/21/2015 89.5  80.0 - 100.0 fL Final  . MCH 02/21/2015 29.1  26.0 - 34.0 pg Final  . MCHC 02/21/2015 32.4  32.0 - 36.0 g/dL Final  . RDW 02/21/2015 13.5  11.5 - 14.5 % Final  . Platelets 02/21/2015 354  150 - 440 K/uL Final  . Neutrophils Relative % 02/21/2015 73   Final  . Neutro Abs 02/21/2015 4.5  1.4 - 6.5 K/uL Final  . Lymphocytes Relative 02/21/2015 12   Final  . Lymphs Abs 02/21/2015 0.7* 1.0 - 3.6 K/uL Final  . Monocytes  Relative 02/21/2015 9   Final  . Monocytes Absolute 02/21/2015 0.5  0.2 - 0.9 K/uL Final  . Eosinophils  Relative 02/21/2015 6   Final  . Eosinophils Absolute 02/21/2015 0.4  0 - 0.7 K/uL Final  . Basophils Relative 02/21/2015 0   Final  . Basophils Absolute 02/21/2015 0.0  0 - 0.1 K/uL Final     STUDIES: Dg Pelvis 1-2 Views  02/14/2015   CLINICAL DATA:  RIGHT hip and femur pain for several months now severe over past couple days, history metastatic breast cancer  EXAM: PELVIS - 1-2 VIEW  COMPARISON:  None.  FINDINGS: Diffuse osseous demineralization.  Marked joint space narrowing of the RIGHT hip joint with bone-on-bone appearance, subchondral sclerosis and subchondral cyst formation.  Minimal narrowing of LEFT hip joint.  SI joints symmetric.  No acute fracture or dislocation.  No sclerotic or destructive bone lesions identified to suggest osseous metastatic disease.  Multilevel degenerative disc and facet disease change at visualized lower lumbar spine.  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  IMPRESSION: Osseous demineralization with advanced osteoarthritic changes of RIGHT hip joint.  Degenerative disc and facet disease changes at visualized lower lumbar spine.   Electronically Signed   By: Lavonia Dana M.D.   On: 02/14/2015 14:45   Nm Cardiac Muga Rest  01/24/2015   CLINICAL DATA:  Cardiomyopathy due to chemotherapy, high risk chemotherapy utilization  EXAM: NUCLEAR MEDICINE CARDIAC BLOOD POOL IMAGING (MUGA)  TECHNIQUE: Cardiac multi-gated acquisition was performed at rest following intravenous injection of Tc-61mlabeled red blood cells.  RADIOPHARMACEUTICALS:  20.97 mCi [id number]-vitro labeled autologous red blood cells IV  COMPARISON:  None  FINDINGS: LEFT ventricular ejection fraction is calculated at 67%, which is within the normal range.  Patient was rhythmic during imaging, with study performed at a heart rate of 78 bpm.  Cine analysis of the gated blood pool in 3 projections demonstrates normal LEFT ventricular wall motion.  IMPRESSION: Normal LEFT ventricular  ejection fraction is 67% with normal LV wall motion.   Electronically Signed   By: MaLavonia Dana.D.   On: 01/24/2015 14:42   Dg Femur, Min 2 Views Right  02/14/2015   CLINICAL DATA:  RIGHT hip and femoral pain for several months now severe in past couple days, history metastatic breast cancer  EXAM: RIGHT FEMUR 2 VIEWS  COMPARISON:  None  FINDINGS: Osseous demineralization.  Marked joint space narrowing of the RIGHT hip joint with minimal subchondral sclerosis and cyst formation and spur formation.  No acute fracture, dislocation or bone destruction.  Joint space narrowing of the RIGHT knee identified with minimal chondrocalcinosis.  Visualized pelvis intact.  No knee joint effusion or regional soft tissue abnormalities.  IMPRESSION: Osseous demineralization.  Degenerative changes of RIGHT hip and RIGHT knee joints as above.   Electronically Signed   By: MaLavonia Dana.D.   On: 02/14/2015 14:46    ASSESSMENT:  [age]year-old lady with a history of carcinoma breast.  Stage  0 tumor carcinoma in situ Status post biopsy. right hip pain Previous history of meningioma treated with radiation therapy We will proceed with chemotherapy with TCGully.  Continue second cycle of chemotherapy patient did not have any significant side effect except for started losing hair 3.  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 Because of severe pain MRI scan has been skin so.  Patient also has been referred to pain clinic to Dr. AdAndree Elkhe needs a CBC done before any  procedure planned to be sure patient is not neutropenic or thrombocytopenic otherwise is safe to proceed with epidural injection  Possibility of pain clinic appointment for control pain while patient is on chemotherapy I will discuss situation with orthopedic surgeon for possibility of steroid injection in the right hip area for temporary relief in the pain  DCIS (ductal carcinoma in situ) of breast   Staging form: Breast, AJCC 7th Edition     Clinical: Stage  0 (Tis (DCIS), N0, M0) - Marni Griffon, MD   02/21/2015 9:41 AM

## 2015-02-23 ENCOUNTER — Inpatient Hospital Stay
Admission: RE | Admit: 2015-02-23 | Discharge: 2015-02-23 | Disposition: A | Discharge status: Home / Self Care | Payer: Medicare Other | Source: Ambulatory Visit | Attending: Oncology | Admitting: Oncology

## 2015-02-23 ENCOUNTER — Other Ambulatory Visit: Payer: Medicare Other

## 2015-02-28 ENCOUNTER — Inpatient Hospital Stay: Payer: Medicare Other | Attending: Oncology

## 2015-02-28 DIAGNOSIS — D0512 Intraductal carcinoma in situ of left breast: Secondary | ICD-10-CM | POA: Diagnosis not present

## 2015-02-28 DIAGNOSIS — M1611 Unilateral primary osteoarthritis, right hip: Secondary | ICD-10-CM | POA: Diagnosis not present

## 2015-02-28 DIAGNOSIS — Z79899 Other long term (current) drug therapy: Secondary | ICD-10-CM | POA: Diagnosis not present

## 2015-02-28 DIAGNOSIS — C773 Secondary and unspecified malignant neoplasm of axilla and upper limb lymph nodes: Secondary | ICD-10-CM | POA: Diagnosis not present

## 2015-02-28 DIAGNOSIS — Z5111 Encounter for antineoplastic chemotherapy: Secondary | ICD-10-CM | POA: Insufficient documentation

## 2015-02-28 DIAGNOSIS — Z171 Estrogen receptor negative status [ER-]: Secondary | ICD-10-CM | POA: Insufficient documentation

## 2015-02-28 DIAGNOSIS — Z418 Encounter for other procedures for purposes other than remedying health state: Secondary | ICD-10-CM | POA: Insufficient documentation

## 2015-02-28 DIAGNOSIS — Z87891 Personal history of nicotine dependence: Secondary | ICD-10-CM | POA: Diagnosis not present

## 2015-02-28 DIAGNOSIS — C50912 Malignant neoplasm of unspecified site of left female breast: Secondary | ICD-10-CM

## 2015-02-28 LAB — CBC WITH DIFFERENTIAL/PLATELET
Basophils Absolute: 0 10*3/uL (ref 0–0.1)
Basophils Relative: 0 %
EOS ABS: 0 10*3/uL (ref 0–0.7)
Eosinophils Relative: 0 %
HEMATOCRIT: 38.6 % (ref 35.0–47.0)
Hemoglobin: 12.6 g/dL (ref 12.0–16.0)
Lymphocytes Relative: 6 %
Lymphs Abs: 1 10*3/uL (ref 1.0–3.6)
MCH: 29.1 pg (ref 26.0–34.0)
MCHC: 32.7 g/dL (ref 32.0–36.0)
MCV: 89.1 fL (ref 80.0–100.0)
Monocytes Absolute: 1.1 10*3/uL — ABNORMAL HIGH (ref 0.2–0.9)
Monocytes Relative: 7 %
NEUTROS PCT: 87 %
Neutro Abs: 15.1 10*3/uL — ABNORMAL HIGH (ref 1.4–6.5)
PLATELETS: 269 10*3/uL (ref 150–440)
RBC: 4.34 MIL/uL (ref 3.80–5.20)
RDW: 14.5 % (ref 11.5–14.5)
WBC: 17.3 10*3/uL — ABNORMAL HIGH (ref 3.6–11.0)

## 2015-03-07 ENCOUNTER — Inpatient Hospital Stay: Payer: Medicare Other

## 2015-03-07 ENCOUNTER — Telehealth: Payer: Self-pay | Admitting: *Deleted

## 2015-03-07 DIAGNOSIS — Z5111 Encounter for antineoplastic chemotherapy: Secondary | ICD-10-CM | POA: Diagnosis not present

## 2015-03-07 DIAGNOSIS — C50912 Malignant neoplasm of unspecified site of left female breast: Secondary | ICD-10-CM

## 2015-03-07 DIAGNOSIS — C773 Secondary and unspecified malignant neoplasm of axilla and upper limb lymph nodes: Principal | ICD-10-CM

## 2015-03-07 LAB — CBC WITH DIFFERENTIAL/PLATELET
BASOS ABS: 0.1 10*3/uL (ref 0–0.1)
BASOS PCT: 1 %
EOS ABS: 0 10*3/uL (ref 0–0.7)
Eosinophils Relative: 0 %
HEMATOCRIT: 34 % — AB (ref 35.0–47.0)
HEMOGLOBIN: 11.1 g/dL — AB (ref 12.0–16.0)
Lymphocytes Relative: 8 %
Lymphs Abs: 0.9 10*3/uL — ABNORMAL LOW (ref 1.0–3.6)
MCH: 29.4 pg (ref 26.0–34.0)
MCHC: 32.7 g/dL (ref 32.0–36.0)
MCV: 89.8 fL (ref 80.0–100.0)
MONO ABS: 0.5 10*3/uL (ref 0.2–0.9)
Monocytes Relative: 4 %
Neutro Abs: 9.9 10*3/uL — ABNORMAL HIGH (ref 1.4–6.5)
Neutrophils Relative %: 87 %
Platelets: 276 10*3/uL (ref 150–440)
RBC: 3.78 MIL/uL — ABNORMAL LOW (ref 3.80–5.20)
RDW: 15 % — AB (ref 11.5–14.5)
WBC: 11.4 10*3/uL — ABNORMAL HIGH (ref 3.6–11.0)

## 2015-03-07 MED ORDER — HYDROCODONE-ACETAMINOPHEN 5-325 MG PO TABS: 1.0000 | ORAL_TABLET | Freq: Once | ORAL | Status: DC

## 2015-03-07 NOTE — Telephone Encounter (Signed)
Informed that prescription is ready to pick up  

## 2015-03-14 ENCOUNTER — Inpatient Hospital Stay: Payer: Medicare Other

## 2015-03-14 ENCOUNTER — Other Ambulatory Visit: Payer: Self-pay | Admitting: Oncology

## 2015-03-14 ENCOUNTER — Inpatient Hospital Stay (HOSPITAL_BASED_OUTPATIENT_CLINIC_OR_DEPARTMENT_OTHER): Payer: Medicare Other | Admitting: Oncology

## 2015-03-14 ENCOUNTER — Encounter: Payer: Self-pay | Admitting: Oncology

## 2015-03-14 VITALS — BP 127/68 | HR 87 | Temp 96.5°F | Wt 143.2 lb

## 2015-03-14 DIAGNOSIS — C50912 Malignant neoplasm of unspecified site of left female breast: Secondary | ICD-10-CM

## 2015-03-14 DIAGNOSIS — Z418 Encounter for other procedures for purposes other than remedying health state: Secondary | ICD-10-CM

## 2015-03-14 DIAGNOSIS — C773 Secondary and unspecified malignant neoplasm of axilla and upper limb lymph nodes: Secondary | ICD-10-CM | POA: Diagnosis not present

## 2015-03-14 DIAGNOSIS — Z5111 Encounter for antineoplastic chemotherapy: Secondary | ICD-10-CM | POA: Diagnosis not present

## 2015-03-14 DIAGNOSIS — D0512 Intraductal carcinoma in situ of left breast: Secondary | ICD-10-CM

## 2015-03-14 DIAGNOSIS — Z171 Estrogen receptor negative status [ER-]: Secondary | ICD-10-CM | POA: Diagnosis not present

## 2015-03-14 DIAGNOSIS — M1611 Unilateral primary osteoarthritis, right hip: Secondary | ICD-10-CM

## 2015-03-14 DIAGNOSIS — Z79899 Other long term (current) drug therapy: Secondary | ICD-10-CM

## 2015-03-14 LAB — CBC WITH DIFFERENTIAL/PLATELET
BASOS PCT: 0 %
Basophils Absolute: 0 10*3/uL (ref 0–0.1)
EOS ABS: 0 10*3/uL (ref 0–0.7)
Eosinophils Relative: 0 %
HCT: 35.5 % (ref 35.0–47.0)
Hemoglobin: 11.9 g/dL — ABNORMAL LOW (ref 12.0–16.0)
LYMPHS ABS: 0.6 10*3/uL — AB (ref 1.0–3.6)
Lymphocytes Relative: 7 %
MCH: 30 pg (ref 26.0–34.0)
MCHC: 33.7 g/dL (ref 32.0–36.0)
MCV: 89 fL (ref 80.0–100.0)
MONOS PCT: 1 %
Monocytes Absolute: 0 10*3/uL — ABNORMAL LOW (ref 0.2–0.9)
NEUTROS PCT: 92 %
Neutro Abs: 7.8 10*3/uL — ABNORMAL HIGH (ref 1.4–6.5)
Platelets: 330 10*3/uL (ref 150–440)
RBC: 3.99 MIL/uL (ref 3.80–5.20)
RDW: 15.9 % — ABNORMAL HIGH (ref 11.5–14.5)
WBC: 8.4 10*3/uL (ref 3.6–11.0)

## 2015-03-14 LAB — COMPREHENSIVE METABOLIC PANEL
ALBUMIN: 4.3 g/dL (ref 3.5–5.0)
ALT: 22 U/L (ref 14–54)
AST: 26 U/L (ref 15–41)
Alkaline Phosphatase: 75 U/L (ref 38–126)
Anion gap: 9 (ref 5–15)
BILIRUBIN TOTAL: 0.5 mg/dL (ref 0.3–1.2)
BUN: 18 mg/dL (ref 6–20)
CALCIUM: 9.1 mg/dL (ref 8.9–10.3)
CO2: 27 mmol/L (ref 22–32)
CREATININE: 0.71 mg/dL (ref 0.44–1.00)
Chloride: 101 mmol/L (ref 101–111)
GFR calc Af Amer: >60 mL/min (ref >60)
GFR calc non Af Amer: >60 mL/min (ref >60)
Glucose, Bld: 189 mg/dL — ABNORMAL HIGH (ref 65–99)
Potassium: 4.2 mmol/L (ref 3.5–5.1)
SODIUM: 137 mmol/L (ref 135–145)
TOTAL PROTEIN: 7.1 g/dL (ref 6.5–8.1)

## 2015-03-14 LAB — MAGNESIUM: Magnesium: 2 mg/dL (ref 1.7–2.4)

## 2015-03-14 MED ORDER — PALONOSETRON HCL INJECTION 0.25 MG/5ML
0.2500 mg | Freq: Once | INTRAVENOUS | Status: AC
  Administered 2015-03-14: 0.25 mg via INTRAVENOUS
  Filled 2015-03-14: qty 5

## 2015-03-14 MED ORDER — SODIUM CHLORIDE 0.9 % IV SOLN
Freq: Once | INTRAVENOUS | Status: AC
  Administered 2015-03-14: 10:00:00 via INTRAVENOUS
  Filled 2015-03-14: qty 5

## 2015-03-14 MED ORDER — TRASTUZUMAB CHEMO INJECTION 440 MG
6.0000 mg/kg | Freq: Once | INTRAVENOUS | Status: AC
  Administered 2015-03-14: 399 mg via INTRAVENOUS
  Filled 2015-03-14: qty 19

## 2015-03-14 MED ORDER — ACETAMINOPHEN 325 MG PO TABS
650.0000 mg | ORAL_TABLET | Freq: Once | ORAL | Status: AC
  Administered 2015-03-14: 650 mg via ORAL
  Filled 2015-03-14: qty 2

## 2015-03-14 MED ORDER — SODIUM CHLORIDE 0.9 % IV SOLN
Freq: Once | INTRAVENOUS | Status: AC
  Administered 2015-03-14: 10:00:00 via INTRAVENOUS
  Filled 2015-03-14: qty 1000

## 2015-03-14 MED ORDER — SODIUM CHLORIDE 0.9 % IJ SOLN
10.0000 mL | INTRAMUSCULAR | Status: DC | PRN
  Administered 2015-03-14: 10 mL
  Filled 2015-03-14: qty 10

## 2015-03-14 MED ORDER — SODIUM CHLORIDE 0.9 % IV SOLN
470.0000 mg | Freq: Once | INTRAVENOUS | Status: AC
  Administered 2015-03-14: 470 mg via INTRAVENOUS
  Filled 2015-03-14: qty 47

## 2015-03-14 MED ORDER — DOCETAXEL CHEMO INJECTION 160 MG/16ML
70.0000 mg/m2 | Freq: Once | INTRAVENOUS | Status: AC
  Administered 2015-03-14: 120 mg via INTRAVENOUS
  Filled 2015-03-14: qty 12

## 2015-03-14 MED ORDER — PEGFILGRASTIM 6 MG/0.6ML ~~LOC~~ PSKT
6.0000 mg | PREFILLED_SYRINGE | Freq: Once | SUBCUTANEOUS | Status: AC
  Administered 2015-03-14: 6 mg via SUBCUTANEOUS
  Filled 2015-03-14: qty 0.6

## 2015-03-14 MED ORDER — HEPARIN SOD (PORK) LOCK FLUSH 100 UNIT/ML IV SOLN
500.0000 [IU] | Freq: Once | INTRAVENOUS | Status: AC | PRN
  Administered 2015-03-14: 500 [IU]
  Filled 2015-03-14: qty 5

## 2015-03-14 NOTE — Progress Notes (Signed)
[place] @ [facility] [phone]  Fax:([phone]) [place]: [birth year]  MR#: [id number]  CBS#:[id number]  Patient Care Team: [name], [name], MD (General Surgery) Self Requested  CHIEF COMPLAINT:  Chief Complaint  Patient presents with  . Follow-up    VISIT DIAGNOSIS:     ICD-9-CM ICD-10-CM   1. Breast cancer metastasized to axillary lymph node, left 174.9 C50.912 Comprehensive metabolic panel   846.6 Z99.3 Magnesium     Oncology History   1.  Abnormal mammogram of the left breast.  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  Progesterone receptor negative.  HER-2/neu receptor positive 2.  Patient is starting [facility] from July OF 2016     Breast cancer metastasized to axillary lymph node   01/24/2015 Initial Diagnosis Breast cancer metastasized to axillary lymph node    Oncology Flowsheet 01/08/2015 01/16/2015 01/31/2015 02/21/2015  Day, Cycle - - Day 1, Cycle 1 Day 1, Cycle 2  CARBOplatin (PARAPLATIN) IV - - 470 mg 470 mg  dexamethasone (DECADRON) IJ - - - -  dexamethasone (DECADRON) IV - - [ 12 mg ] [ 12 mg ]  diphenhydrAMINE (BENADRYL) PO - - 50 mg 50 mg  DOCEtaxel (TAXOTERE) IV - - 70 mg/m2 70 mg/m2  fosaprepitant (EMEND) IV - - [ 150 mg ] [ 150 mg ]  ondansetron (ZOFRAN) IV - - - -  palonosetron (ALOXI) IV - - 0.25 mg 0.25 mg  pegfilgrastim (NEULASTA ONPRO KIT) [place] - - 6 mg 6 mg  trastuzumab (HERCEPTIN) IV - - 8 mg/kg 6 mg/kg    INTERVAL HISTORY:  74 year old lady who had a regular mammogram recent mammogram with abnormal ultrasound was done.  There were 3 nodules were found.  Patient underwent stereotactic ultrasound-guided biopsy.  3 nodules were 4 cm 5 cm in 6 cm away from nipple.  And the  one nodule at 5 cm revealed focal changes consistent with high-grade ductal   Carcinoma in situ  Patient has been evaluated by surgeon and here for further evaluation and treatment consideration.  Slightly apprehensive but not any acute distress February 21, 2015 Patient is here for ongoing evaluation and treatment consideration Right hip pain continues to bother her.  Plain x-ray did not reveal any evidence of fracture.  MRI scan could not be done as patient could not stay still on the table.  A no tingling numbness appetite has been fairly good.  No nausea or vomiting. august17, 2016 Patient is here for ongoing evaluation and continuation of chemotherapy. Patient is starting cycle 3 of chemotherapy no nausea no vomiting no tingling numbness.  Appetite has been stable.  No cough or shortness of breath No soreness in the mouth Patient continues to have right hip pain.  Patient did not go for pain clinic management [place] felt better but again now right hip is bothering her REVIEW OF SYSTEMS:   GENERAL:   Significant distress because of pain PERFORMANCE STATUS (ECOG): 0  HEENT:  No visual changes, runny nose, sore throat, mouth sores or tenderness. Lungs: No shortness of breath or cough.  No hemoptysis. Cardiac:  No chest pain, palpitations, orthopnea, or PND. GI:  No nausea, vomiting, diarrhea, constipation, melena or hematochezia. GU:  No urgency, frequency, dysuria, or hematuria. Prior  use of birth control pills several years ago.  Did not use any estrogen or any other hormone replacement therapy Musculoskeletal: Right hip pain .    Skin:  No rashes or skin changes. Neuro:  No headache, numbness or weakness, balance or coordination issues. Endocrine:  No diabetes, thyroid issues, hot flashes or night sweats. Psych:  No mood changes, depression or anxiety. Pain:  Severe right hip pain and pain in the low back area radiating down to right lower extremity Review of systems:  All other systems  reviewed and found to be negative. As per HPI. Otherwise, a complete review of systems is negatve.  PAST MEDICAL HISTORY: Past Medical History  Diagnosis Date  . 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 Hearing loss   . Memory impairment     seen by Dr Manuella Ghazi  possible post crainiotomy from radiation  . 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    PAST SURGICAL HISTORY: Past Surgical History  Procedure Laterality Date  . Appendectomy  1950  . Brain surgery  1995    left frontal/temporal  . Colonoscopy  2010    Dr. [name]  . Breast biopsy Left 1997  . 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 Breast biopsy Left 01/08/2015    Procedure: BREAST BIOPSY WITH NEEDLE LOCALIZATION;  Surgeon: [name], MD;  Location: [facility] ORS;  Service: General;  Laterality: Left;  . Breast lumpectomy Left 01/08/2015    Procedure: LUMPECTOMY;  Surgeon: [name], MD;  Location: [facility] ORS;  Service: General;  Laterality: Left;  . Sentinel node biopsy Left 01/16/2015    Procedure: SENTINEL NODE BIOPSY;  Surgeon: [name], MD;  Location: [facility] ORS;  Service: General;  Laterality: Left;  . Portacath placement Right 01/16/2015    Procedure: INSERTION PORT-A-CATH;  Surgeon: [name], MD;  Location: [facility] ORS;  Service: General;  Laterality: Right;    FAMILY HISTORY Family History  Problem Relation Age of Onset  . Breast cancer Sister 79  . Addison's disease Mother   . Stroke Father            HEALTH MAINTENANCE: Social History  Substance Use Topics  . Smoking status: Former Smoker    Quit date: 12/28/1964  . Smokeless tobacco: Never Used  . Alcohol Use: 0.6 - 1.2 oz/week    1-2 Glasses of wine per week     Comment: 1 Glass Wine / Night      No Known Allergies  Current Outpatient Prescriptions  Medication Sig Dispense Refill  . Ascorbic  Acid (VITAMIN C) 100 MG tablet Take 100 mg by mouth every morning.     . Calcium Carbonate-Vit D-Min (CALTRATE 600+D PLUS MINERALS) 600-800 MG-UNIT TABS Take 1 tablet by mouth every morning.    . calcium-vitamin D 250-100 MG-UNIT per tablet Take 1 tablet by mouth 1 day or 1 dose. 600 mg    . dexamethasone (DECADRON) 4 MG tablet Take 2 tablets (8 mg total) by mouth 2 (two) times daily. Start the day before Taxotere. Then again the day after chemo. 30 tablet 1  . gabapentin (NEURONTIN) 300 MG capsule Take 1 capsule (300 mg total) by mouth at bedtime. 30 capsule 3  . HYDROcodone-acetaminophen (NORCO/VICODIN) 5-325 MG per tablet Take 1 tablet by mouth once. 40 tablet 0  . lidocaine-prilocaine (EMLA) cream  Apply 1 application topically as needed. 30 g 3  . LORazepam (ATIVAN) 0.5 MG tablet Take 1 tablet (0.5 mg total) by mouth every 6 (six) hours as needed (Nausea or vomiting). 30 tablet 0  . meloxicam (MOBIC) 7.5 MG tablet Take 7.5 mg by mouth every morning.     . ondansetron (ZOFRAN) 8 MG tablet Take 1 tablet (8 mg total) by mouth 2 (two) times daily. Start the day after chemo for 3 days. Then take as needed for nausea or vomiting. 30 tablet 1  . traMADol (ULTRAM) 50 MG tablet Take by mouth 3 (three) times daily.     . Triprolidine-Pseudoephedrine (ANTIHISTAMINE PO) Take by mouth every morning.      No current facility-administered medications for this visit.    OBJECTIVE: GENERAL:  Well developed, well nourished, sitting comfortably in the exam room in no acute distress. Fatigue  grade 1 MENTAL STATUS:  Alert and oriented to person, place and time.  ENT: No evidence of stomatitisI.  Alopecia  RESPIRATORY:  Clear to auscultation without rales, wheezes or rhonchi. CARDIOVASCULAR:  Regular rate and rhythm without murmur, rub or gallop. Port site within normal limit ABDOMEN:  Soft, non-tender, with active bowel sounds, and no hepatosplenomegaly.  No masses. BACK:  No CVA tenderness.  No tenderness  on percussion of the back or rib cage. SKIN:  No rashes, ulcers or lesions. EXTREMITIES: No edema, no skin discoloration or tenderness.  No palpable cords. LYMPH NODES: No palpable cervical, supraclavicular, axillary or inguinal adenopathy  NEUROLOGICAL: Unremarkable. PSYCH:  Appropriate.  Filed Vitals:   03/14/15 0905  BP: 127/68  Pulse: 87  Temp: 96.5 F (35.8 C)     Body mass index is 24.58 kg/(m^2).    ECOG FS:1 - Symptomatic but completely ambulatory  LAB RESULTS:  Appointment on 03/14/2015  Component Date Value Ref Range Status  . WBC 03/14/2015 8.4  3.6 - 11.0 K/uL Final  . RBC 03/14/2015 3.99  3.80 - 5.20 MIL/uL Final  . Hemoglobin 03/14/2015 11.9* 12.0 - 16.0 g/dL Final  . HCT 03/14/2015 35.5  35.0 - 47.0 % Final  . MCV 03/14/2015 89.0  80.0 - 100.0 fL Final  . MCH 03/14/2015 30.0  26.0 - 34.0 pg Final  . MCHC 03/14/2015 33.7  32.0 - 36.0 g/dL Final  . RDW 03/14/2015 15.9* 11.5 - 14.5 % Final  . Platelets 03/14/2015 330  150 - 440 K/uL Final  . Neutrophils Relative % 03/14/2015 92   Final  . Neutro Abs 03/14/2015 7.8* 1.4 - 6.5 K/uL Final  . Lymphocytes Relative 03/14/2015 7   Final  . Lymphs Abs 03/14/2015 0.6* 1.0 - 3.6 K/uL Final  . Monocytes Relative 03/14/2015 1   Final  . Monocytes Absolute 03/14/2015 0.0* 0.2 - 0.9 K/uL Final  . Eosinophils Relative 03/14/2015 0   Final  . Eosinophils Absolute 03/14/2015 0.0  0 - 0.7 K/uL Final  . Basophils Relative 03/14/2015 0   Final  . Basophils Absolute 03/14/2015 0.0  0 - 0.1 K/uL Final  . Sodium 03/14/2015 137  135 - 145 mmol/L Final  . Potassium 03/14/2015 4.2  3.5 - 5.1 mmol/L Final  . Chloride 03/14/2015 101  101 - 111 mmol/L Final  . CO2 03/14/2015 27  22 - 32 mmol/L Final  . Glucose, Bld 03/14/2015 189* 65 - 99 mg/dL Final  . BUN 03/14/2015 18  6 - 20 mg/dL Final  . Creatinine, Ser 03/14/2015 0.71  0.44 - 1.00 mg/dL Final  . Calcium  03/14/2015 9.1  8.9 - 10.3 mg/dL Final  . Total Protein 03/14/2015 7.1  6.5 -  8.1 g/dL Final  . Albumin 03/14/2015 4.3  3.5 - 5.0 g/dL Final  . AST 03/14/2015 26  15 - 41 U/L Final  . ALT 03/14/2015 22  14 - 54 U/L Final  . Alkaline Phosphatase 03/14/2015 75  38 - 126 U/L Final  . Total Bilirubin 03/14/2015 0.5  0.3 - 1.2 mg/dL Final  . GFR calc non Af Amer 03/14/2015 >60  >60 mL/min Final  . GFR calc Af Amer 03/14/2015 >60  >60 mL/min Final   Comment: (NOTE) The eGFR has been calculated using the CKD EPI equation. This calculation has not been validated in all clinical situations. eGFR's persistently <60 mL/min signify possible Chronic Kidney Disease.   . Anion gap 03/14/2015 9  5 - 15 Final  . Magnesium 03/14/2015 2.0  1.7 - 2.4 mg/dL Final     STUDIES: Dg Pelvis 1-2 Views  02/14/2015   CLINICAL DATA:  RIGHT hip and femur pain for several months now severe over past couple days, history metastatic breast cancer  EXAM: PELVIS - 1-2 VIEW  COMPARISON:  None.  FINDINGS: Diffuse osseous demineralization.  Marked joint space narrowing of the RIGHT hip joint with bone-on-bone appearance, subchondral sclerosis and subchondral cyst formation.  Minimal narrowing of LEFT hip joint.  SI joints symmetric.  No acute fracture or dislocation.  No sclerotic or destructive bone lesions identified to suggest osseous metastatic disease.  Multilevel degenerative disc and facet disease change at visualized lower lumbar spine.  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  IMPRESSION: Osseous demineralization with advanced osteoarthritic changes of RIGHT hip joint.  Degenerative disc and facet disease changes at visualized lower lumbar spine.   Electronically Signed   By: Lavonia Dana M.D.   On: 02/14/2015 14:45   Dg Femur, Min 2 Views Right  02/14/2015   CLINICAL DATA:  RIGHT hip and femoral pain for several months now severe in past couple days, history metastatic breast cancer  EXAM: RIGHT FEMUR 2 VIEWS  COMPARISON:  None  FINDINGS: Osseous demineralization.  Marked  joint space narrowing of the RIGHT hip joint with minimal subchondral sclerosis and cyst formation and spur formation.  No acute fracture, dislocation or bone destruction.  Joint space narrowing of the RIGHT knee identified with minimal chondrocalcinosis.  Visualized pelvis intact.  No knee joint effusion or regional soft tissue abnormalities.  IMPRESSION: Osseous demineralization.  Degenerative changes of RIGHT hip and RIGHT knee joints as above.   Electronically Signed   By: Lavonia Dana M.D.   On: 02/14/2015 14:46    ASSESSMENT:  48 -year-old lady with a history of carcinoma breast.  Stage  0 tumor carcinoma in situ Status post biopsy. right hip pain Previous history of meningioma treated with radiation therapy We will proceed with chemotherapy with [place] 2.  Continue second cycle of chemotherapy patient did not have any significant side effect except for started losing hair 3.  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 Because of severe pain MRI scan has been skin so.  Patient also has been referred to pain clinic to Dr. Andree Elk She needs a CBC done before any procedure planned to be sure patient is not neutropenic or thrombocytopenic otherwise is safe to proceed with epidural injection  Possibility of pain clinic appointment for control pain while patient is on chemotherapy I will discuss situation with orthopedic surgeon for possibility of steroid injection in  the right hip area for temporary relief in the pain  T1c N1 C micro-M0 Estrogen and progesterone receptor negative HER-2 receptor positive   Staging form: Breast, AJCC 7th Edition    [name], MD   03/14/2015 9:24 AM

## 2015-03-14 NOTE — Progress Notes (Signed)
Patient does have living will.  Former smoker.  Patient was feeling better and did not keep her pain center appointment.

## 2015-03-20 ENCOUNTER — Ambulatory Visit: Payer: Medicare Other | Attending: Pain Medicine | Admitting: Pain Medicine

## 2015-03-20 ENCOUNTER — Encounter: Payer: Self-pay | Admitting: Pain Medicine

## 2015-03-20 VITALS — BP 113/70 | HR 116 | Temp 98.3°F | Resp 16 | Ht 64.0 in | Wt 143.0 lb

## 2015-03-20 DIAGNOSIS — M79605 Pain in left leg: Secondary | ICD-10-CM | POA: Diagnosis present

## 2015-03-20 DIAGNOSIS — C50911 Malignant neoplasm of unspecified site of right female breast: Secondary | ICD-10-CM | POA: Insufficient documentation

## 2015-03-20 DIAGNOSIS — M158 Other polyosteoarthritis: Secondary | ICD-10-CM

## 2015-03-20 DIAGNOSIS — M533 Sacrococcygeal disorders, not elsewhere classified: Secondary | ICD-10-CM | POA: Insufficient documentation

## 2015-03-20 DIAGNOSIS — M1610 Unilateral primary osteoarthritis, unspecified hip: Secondary | ICD-10-CM | POA: Diagnosis not present

## 2015-03-20 DIAGNOSIS — M545 Low back pain: Secondary | ICD-10-CM | POA: Diagnosis present

## 2015-03-20 DIAGNOSIS — M5136 Other intervertebral disc degeneration, lumbar region: Secondary | ICD-10-CM | POA: Diagnosis not present

## 2015-03-20 DIAGNOSIS — M12819 Other specific arthropathies, not elsewhere classified, unspecified shoulder: Secondary | ICD-10-CM

## 2015-03-20 DIAGNOSIS — M79604 Pain in right leg: Secondary | ICD-10-CM | POA: Diagnosis present

## 2015-03-20 NOTE — Patient Instructions (Addendum)
Continue present medications please Take 1 Mobic per day                                                               Take Neurontin 300 mg at bedtime and in the morning for 1 week   THEN   Take 3 times per day if tolerated                                                               Take Ultram 50 mg 2-4 times per day if tolerated                                                               Take hydrocodone acetaminophen every 3-4 hours if needed for severe breakthrough pain  F/U PCP Dr. Ramonita Lab for evaliation of  BP and general medical  condition  F/U surgical evaluation Dr. Rosanna Randy and Dr. Jonny Ruiz as discussed   F/U  Dr.Choksi  F/U neurological evaluation  May consider radiofrequency rhizolysis or intraspinal procedures pending response to present treatment and F/U evaluation   Patient to call Pain Management Center should patient have concerns prior to scheduled return appointmen.

## 2015-03-20 NOTE — Progress Notes (Signed)
Safety precautions to be maintained throughout the outpatient stay will include: orient to surroundings, keep bed in low position, maintain call bell within reach at all times, provide assistance with transfer out of bed and ambulation.  

## 2015-03-20 NOTE — Progress Notes (Signed)
Subjective:    Patient ID: [name], female    DOB: [birth year], 74 y.o.   MRN: [id number]  HPI  Patient is 74 year old female who comes to pain management Center at the request of  Dr. Oliva Bustard for further evaluation and treatment of pain involving the lower back lower extremity region especially the region of the right hip. Patient was recently undergoing evaluation and was found to be with carcinoma the breast. Patient is presently undergoing treatment for carcinoma of the right breast. The patient is undergone surgical evaluation including evaluation by Dr. Sabra Heck and by Dr. Rosanna Randy. At the present time patient will continue her treatment for carcinoma of the breast and surgical intervention for pain of the hip will be delayed. We discussed patient's condition and we will avoid interventional treatment and we will attempt to treat patient's pain with non-interventional means. All understanding and agree with suggested treatment plan. The patient described her pain is aching intervention disabling pain patient stated the pain is associated with nighttime cramps. Patient stated the pain increases with walking motion standing. Patient stated the pain had decreased with medications and with interventional treatment as previous segment and decision has been made to avoid additional interventional treatment at this time. We will proceed with treating patient for not interventional treatment means. We reviewed patient's medications on today's visit and will modify medications  gabapentin meloxicam   tramadol  and hydrocodone acetaminophen. All were understanding and agreed suggested treatment plan   Review of Systems      Cardiovascular  Unremarkable   Pulmonary  Unremarkable   Psychological  Unremarkable   Gastrointestinal  Unremarkable   Genitourinary  Unremarkable   Hematological  Easy bruisability   Endocrine  Unremarkable   Rheumatological  Osteoarthritis   Musculoskeletal  Unremarkable   Other significant  Carcinoma of right breast  Objective:   Physical Exam   Patient awake alert oriented 3   There was tenderness of the splenius capitis and occipitalis region a minimal degree. There appeared to be unremarkable Spurling's maneuver. There was minimal tenderness of the cervical facet cervical paraspinal musculature region. There was minimal tenderness of the thoracic facet thoracic paraspinal musculature region. There was no crepitus of the thoracic region noted. Patient appeared to be with bilaterally equal grip strength and Tinel and Phalen's maneuver were without increase of pain of significant degree. There was tenderness over the late region of the lumbar paraspinal muscles region lumbar facet region a minimal degree. Palpation over the lumbar facet lumbar paraspinal muscles visual region was a tends to palpation right greater than the left. Extension and palpation of the lumbar facets reproduce moderate discomfort. There was moderate increased pain with palpation over the PSIS and PII S regions right greater than the left. There was a slight increase of pain with pressure prior to the ilium with patient in lateral decubitus position. There was moderate tenderness to palpation of the greater trochanteric region and iliotibial band region right greater than the left. DTRs trace at the knees. No sensory deficit of dermatomal distribution detected. Native clonus negative Homans. EHL strength appeared to be equal abdomen was nontender and no costovertebral angle tenderness was noted.     Assessment & Plan:    Degenerative joint disease of hip    Degenerative disc disease lumbar spine   Lumbar facet syndrome   Sacroiliac joint dysfunction   Carcinoma of right breast    Plan  Continue present medications Neurontin 300 mg  by mouth twice a  day for 7 days  THEN  3 times a day                                                   if tolerated                                                    MOBIC  7.5 mg  by mouth per day                                                   Tramadol  50 mg . Limited 2-4 tabs by mouth per day if tolerated                                                    Hydrocodone acetaminophen  (5/325mg )  Limit  1 tab by mouth every                                                         3-4 hours as needed for breakthrough pain       F/U PCP  Dr. Ramonita Lab for evaliation of  BP carcinoma of breast and general medical  Condition  F/U   Dr. Jeb Levering  As planned  F/U surgical evaluation  With Dr. Rosanna Randy and Dr.H Sabra Heck  as discussed and as planned  F/U neurological evaluation  May consider radiofrequency rhizolysis or intraspinal procedures pending response to present treatment and F/U evaluation . At the present time we will avoid considering interventional treatment and will continue with non-interventional treatment of patient  Patient to call Pain Management Center should patient have concerns prior to scheduled return appointmen.

## 2015-03-21 ENCOUNTER — Inpatient Hospital Stay: Payer: Medicare Other

## 2015-03-21 DIAGNOSIS — Z5111 Encounter for antineoplastic chemotherapy: Secondary | ICD-10-CM | POA: Diagnosis not present

## 2015-03-21 DIAGNOSIS — C773 Secondary and unspecified malignant neoplasm of axilla and upper limb lymph nodes: Principal | ICD-10-CM

## 2015-03-21 DIAGNOSIS — C50912 Malignant neoplasm of unspecified site of left female breast: Secondary | ICD-10-CM

## 2015-03-21 LAB — CBC WITH DIFFERENTIAL/PLATELET
Basophils Absolute: 0.1 10*3/uL (ref 0–0.1)
Basophils Relative: 0 %
Eosinophils Absolute: 0 10*3/uL (ref 0–0.7)
Eosinophils Relative: 0 %
HCT: 36.2 % (ref 35.0–47.0)
Hemoglobin: 11.9 g/dL — ABNORMAL LOW (ref 12.0–16.0)
LYMPHS ABS: 1.9 10*3/uL (ref 1.0–3.6)
Lymphocytes Relative: 8 %
MCH: 29.5 pg (ref 26.0–34.0)
MCHC: 32.9 g/dL (ref 32.0–36.0)
MCV: 89.7 fL (ref 80.0–100.0)
MONO ABS: 1.7 10*3/uL — AB (ref 0.2–0.9)
Monocytes Relative: 7 %
Neutro Abs: 19.9 10*3/uL — ABNORMAL HIGH (ref 1.4–6.5)
Neutrophils Relative %: 85 %
Platelets: 295 10*3/uL (ref 150–440)
RBC: 4.03 MIL/uL (ref 3.80–5.20)
RDW: 16.4 % — AB (ref 11.5–14.5)
WBC: 23.6 10*3/uL — ABNORMAL HIGH (ref 3.6–11.0)

## 2015-03-26 ENCOUNTER — Ambulatory Visit (INDEPENDENT_AMBULATORY_CARE_PROVIDER_SITE_OTHER): Payer: Medicare Other | Admitting: General Surgery

## 2015-03-26 ENCOUNTER — Encounter: Payer: Self-pay | Admitting: General Surgery

## 2015-03-26 VITALS — BP 116/66 | HR 72 | Resp 16 | Ht 64.5 in | Wt 142.8 lb

## 2015-03-26 DIAGNOSIS — D0512 Intraductal carcinoma in situ of left breast: Secondary | ICD-10-CM

## 2015-03-26 NOTE — Progress Notes (Signed)
Patient ID: [name], female   DOB: [birth year], 74 y.o.   MRN: [id number]  Chief Complaint  Patient presents with  . Follow-up    Breast    HPI [name] is a 74 y.o. female here today for a 2 month follow up with Breast cancer. She has had 3 treatment at the cancer center. She is tolerating the treatments well. Patient states she does have some fatigue at times but is doing well. HPI  Past Medical History  Diagnosis Date  . 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 Hearing loss   . Memory impairment     seen by Dr Manuella Ghazi  possible post crainiotomy from radiation  . 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    Past Surgical History  Procedure Laterality Date  . Appendectomy  1950  . Brain surgery  1995    left frontal/temporal  . Colonoscopy  2010    Dr. [name]  . Breast biopsy Left 1997  . 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 Breast biopsy Left 01/08/2015    Procedure: BREAST BIOPSY WITH NEEDLE LOCALIZATION;  Surgeon: [name], MD;  Location: [facility];  Service: General;  Laterality: Left;  . Breast lumpectomy Left 01/08/2015    Procedure: LUMPECTOMY;  Surgeon: [name], MD;  Location: [facility];  Service: General;  Laterality: Left;  . Sentinel node biopsy Left 01/16/2015    Procedure: SENTINEL NODE BIOPSY;  Surgeon: [name], MD;  Location: [facility];  Service: General;  Laterality: Left;  . Portacath placement Right 01/16/2015    Procedure: INSERTION PORT-A-CATH;  Surgeon: [name], MD;  Location: [facility];  Service: General;  Laterality: Right;    Family History  Problem Relation Age of Onset  . Breast cancer Sister 20  . Addison's disease Mother   . Stroke Father     Social History Social History  Substance Use Topics  . Smoking status: Former Smoker    Quit date: 12/28/1964  . Smokeless  tobacco: Never Used  . Alcohol Use: 0.6 - 1.2 oz/week    1-2 Glasses of wine per week     Comment: 1 Glass Wine / Night    No Known Allergies  Current Outpatient Prescriptions  Medication Sig Dispense Refill  . Ascorbic Acid (VITAMIN C) 100 MG tablet Take 100 mg by mouth every morning.     . Calcium Carbonate-Vit D-Min (CALTRATE 600+D PLUS MINERALS) 600-800 MG-UNIT TABS Take 1 tablet by mouth every morning.    . calcium-vitamin D 250-100 MG-UNIT per tablet Take 1 tablet by mouth 1 day or 1 dose. 600 mg    . dexamethasone (DECADRON) 4 MG tablet Take 2 tablets (8 mg total) by mouth 2 (two) times daily. Start the day before Taxotere. Then again the day after chemo. 30 tablet 1  . gabapentin (NEURONTIN) 300 MG capsule Take 1 capsule (300 mg total) by mouth at bedtime. 30 capsule 3  . HYDROcodone-acetaminophen (NORCO/VICODIN) 5-325 MG per tablet Take 1 tablet by mouth once. 40 tablet 0  . lidocaine-prilocaine (EMLA) cream Apply 1 application topically as needed. 30 g 3  . LORazepam (ATIVAN) 0.5 MG tablet Take 1 tablet (0.5 mg total) by mouth every 6 (six) hours as needed (Nausea or vomiting). 30 tablet 0  . meloxicam (MOBIC) 7.5 MG tablet Take  7.5 mg by mouth every morning.     . ondansetron (ZOFRAN) 8 MG tablet Take 1 tablet (8 mg total) by mouth 2 (two) times daily. Start the day after chemo for 3 days. Then take as needed for nausea or vomiting. 30 tablet 1  . traMADol (ULTRAM) 50 MG tablet Take by mouth 3 (three) times daily.     . Triprolidine-Pseudoephedrine (ANTIHISTAMINE PO) Take by mouth every morning.      No current facility-administered medications for this visit.    Review of Systems Review of Systems  Constitutional: Positive for fatigue. Negative for fever, chills and diaphoresis.  Respiratory: Negative.   Cardiovascular: Negative.     Blood pressure 116/66, pulse 72, resp. rate 16, height 5' 4.5" (1.638 m), weight 142 lb 12.8 oz (64.774 kg).  Physical Exam Physical Exam   Constitutional: She is oriented to person, place, and time. She appears well-developed and well-nourished.  Eyes: Conjunctivae are normal. No scleral icterus.  Cardiovascular: Normal rate, regular rhythm and normal heart sounds.   Pulmonary/Chest: Effort normal and breath sounds normal. Right breast exhibits no inverted nipple, no mass, no nipple discharge, no skin change and no tenderness. Left breast exhibits no inverted nipple, no mass, no nipple discharge, no skin change and no tenderness.    Well healed left breast  Lymphadenopathy:    She has no cervical adenopathy.       Left: No supraclavicular adenopathy present.  Neurological: She is alert and oriented to person, place, and time.  Skin: Skin is warm and dry.  Psychiatric: Her behavior is normal.    Data Reviewed Patient has tolerated 3 of 6 to planned chemotherapy treatments well.  Evaluated by pain clinic with adjustment of medications for management of right hip pain. MRI not completed due to inability to become comfortable in the scanner. Plain films did not show metastatic disease.  Assessment    Doing well status post partial mastectomy and sentinel node biopsy, tolerating chemotherapy well.    Plan    Timing for planned hip replacement will be made after completion of chemotherapy. This might be done prior to radiation.     Follow up in May 2017 with bilateral diagnostic mammogram.    PCP: Dr. Verlin Grills, [name] 03/26/2015, 9:38 AM

## 2015-03-26 NOTE — Patient Instructions (Signed)
Follow up in May 2017 with bilateral diagnostic mammogram. Call the office with any concerns.

## 2015-03-28 ENCOUNTER — Inpatient Hospital Stay: Payer: Medicare Other

## 2015-03-28 DIAGNOSIS — C773 Secondary and unspecified malignant neoplasm of axilla and upper limb lymph nodes: Principal | ICD-10-CM

## 2015-03-28 DIAGNOSIS — Z5111 Encounter for antineoplastic chemotherapy: Secondary | ICD-10-CM | POA: Diagnosis not present

## 2015-03-28 DIAGNOSIS — C50912 Malignant neoplasm of unspecified site of left female breast: Secondary | ICD-10-CM

## 2015-03-28 LAB — CBC WITH DIFFERENTIAL/PLATELET
Basophils Absolute: 0 10*3/uL (ref 0–0.1)
Basophils Relative: 0 %
EOS PCT: 0 %
Eosinophils Absolute: 0 10*3/uL (ref 0–0.7)
HCT: 34.7 % — ABNORMAL LOW (ref 35.0–47.0)
Hemoglobin: 11.4 g/dL — ABNORMAL LOW (ref 12.0–16.0)
LYMPHS ABS: 0.9 10*3/uL — AB (ref 1.0–3.6)
LYMPHS PCT: 8 %
MCH: 29.3 pg (ref 26.0–34.0)
MCHC: 32.8 g/dL (ref 32.0–36.0)
MCV: 89.5 fL (ref 80.0–100.0)
Monocytes Absolute: 0.6 10*3/uL (ref 0.2–0.9)
Monocytes Relative: 5 %
NEUTROS ABS: 10.3 10*3/uL — AB (ref 1.4–6.5)
Neutrophils Relative %: 87 %
PLATELETS: 224 10*3/uL (ref 150–440)
RBC: 3.88 MIL/uL (ref 3.80–5.20)
RDW: 16.9 % — ABNORMAL HIGH (ref 11.5–14.5)
WBC: 11.9 10*3/uL — AB (ref 3.6–11.0)

## 2015-04-04 ENCOUNTER — Inpatient Hospital Stay: Payer: Medicare Other

## 2015-04-04 ENCOUNTER — Inpatient Hospital Stay (HOSPITAL_BASED_OUTPATIENT_CLINIC_OR_DEPARTMENT_OTHER): Payer: Medicare Other | Admitting: Oncology

## 2015-04-04 ENCOUNTER — Inpatient Hospital Stay: Payer: Medicare Other | Attending: Oncology

## 2015-04-04 ENCOUNTER — Encounter: Payer: Self-pay | Admitting: Oncology

## 2015-04-04 DIAGNOSIS — C50912 Malignant neoplasm of unspecified site of left female breast: Secondary | ICD-10-CM

## 2015-04-04 DIAGNOSIS — D0512 Intraductal carcinoma in situ of left breast: Secondary | ICD-10-CM | POA: Insufficient documentation

## 2015-04-04 DIAGNOSIS — J069 Acute upper respiratory infection, unspecified: Secondary | ICD-10-CM | POA: Insufficient documentation

## 2015-04-04 DIAGNOSIS — Z79899 Other long term (current) drug therapy: Secondary | ICD-10-CM | POA: Diagnosis not present

## 2015-04-04 DIAGNOSIS — Z418 Encounter for other procedures for purposes other than remedying health state: Secondary | ICD-10-CM | POA: Diagnosis not present

## 2015-04-04 DIAGNOSIS — Z171 Estrogen receptor negative status [ER-]: Secondary | ICD-10-CM

## 2015-04-04 DIAGNOSIS — Z923 Personal history of irradiation: Secondary | ICD-10-CM | POA: Insufficient documentation

## 2015-04-04 DIAGNOSIS — Z87891 Personal history of nicotine dependence: Secondary | ICD-10-CM | POA: Diagnosis not present

## 2015-04-04 DIAGNOSIS — C773 Secondary and unspecified malignant neoplasm of axilla and upper limb lymph nodes: Secondary | ICD-10-CM | POA: Insufficient documentation

## 2015-04-04 DIAGNOSIS — M1611 Unilateral primary osteoarthritis, right hip: Secondary | ICD-10-CM

## 2015-04-04 DIAGNOSIS — J9811 Atelectasis: Secondary | ICD-10-CM | POA: Diagnosis not present

## 2015-04-04 DIAGNOSIS — Z5111 Encounter for antineoplastic chemotherapy: Secondary | ICD-10-CM | POA: Insufficient documentation

## 2015-04-04 LAB — CBC WITH DIFFERENTIAL/PLATELET
BASOS ABS: 0 10*3/uL (ref 0–0.1)
Basophils Relative: 0 %
EOS ABS: 0 10*3/uL (ref 0–0.7)
Eosinophils Relative: 0 %
HCT: 32.4 % — ABNORMAL LOW (ref 35.0–47.0)
Hemoglobin: 10.8 g/dL — ABNORMAL LOW (ref 12.0–16.0)
LYMPHS PCT: 9 %
Lymphs Abs: 0.6 10*3/uL — ABNORMAL LOW (ref 1.0–3.6)
MCH: 30.2 pg (ref 26.0–34.0)
MCHC: 33.5 g/dL (ref 32.0–36.0)
MCV: 90.2 fL (ref 80.0–100.0)
Monocytes Absolute: 0.2 10*3/uL (ref 0.2–0.9)
Monocytes Relative: 2 %
Neutro Abs: 6.2 10*3/uL (ref 1.4–6.5)
Neutrophils Relative %: 89 %
PLATELETS: 231 10*3/uL (ref 150–440)
RBC: 3.59 MIL/uL — AB (ref 3.80–5.20)
RDW: 17.6 % — ABNORMAL HIGH (ref 11.5–14.5)
WBC: 7 10*3/uL (ref 3.6–11.0)

## 2015-04-04 LAB — COMPREHENSIVE METABOLIC PANEL
ALT: 17 U/L (ref 14–54)
ANION GAP: 11 (ref 5–15)
AST: 33 U/L (ref 15–41)
Albumin: 3.8 g/dL (ref 3.5–5.0)
Alkaline Phosphatase: 58 U/L (ref 38–126)
BUN: 16 mg/dL (ref 6–20)
CHLORIDE: 100 mmol/L — AB (ref 101–111)
CO2: 24 mmol/L (ref 22–32)
Calcium: 8.6 mg/dL — ABNORMAL LOW (ref 8.9–10.3)
Creatinine, Ser: 0.66 mg/dL (ref 0.44–1.00)
Glucose, Bld: 173 mg/dL — ABNORMAL HIGH (ref 65–99)
POTASSIUM: 3.5 mmol/L (ref 3.5–5.1)
Sodium: 135 mmol/L (ref 135–145)
TOTAL PROTEIN: 6.6 g/dL (ref 6.5–8.1)
Total Bilirubin: 0.5 mg/dL (ref 0.3–1.2)

## 2015-04-04 LAB — MAGNESIUM: MAGNESIUM: 1.8 mg/dL (ref 1.7–2.4)

## 2015-04-04 MED ORDER — SODIUM CHLORIDE 0.9 % IV SOLN
470.0000 mg | Freq: Once | INTRAVENOUS | Status: AC
  Administered 2015-04-04: 470 mg via INTRAVENOUS
  Filled 2015-04-04: qty 47

## 2015-04-04 MED ORDER — ACETAMINOPHEN 325 MG PO TABS
650.0000 mg | ORAL_TABLET | Freq: Once | ORAL | Status: AC
Start: 2015-04-04 — End: 2015-04-04
  Administered 2015-04-04: 650 mg via ORAL
  Filled 2015-04-04: qty 2

## 2015-04-04 MED ORDER — HEPARIN SOD (PORK) LOCK FLUSH 100 UNIT/ML IV SOLN
500.0000 [IU] | Freq: Once | INTRAVENOUS | Status: AC | PRN
  Administered 2015-04-04: 500 [IU]
  Filled 2015-04-04: qty 5

## 2015-04-04 MED ORDER — SODIUM CHLORIDE 0.9 % IV SOLN
Freq: Once | INTRAVENOUS | Status: AC
  Administered 2015-04-04: 10:00:00 via INTRAVENOUS
  Filled 2015-04-04: qty 1000

## 2015-04-04 MED ORDER — SODIUM CHLORIDE 0.9 % IV SOLN
Freq: Once | INTRAVENOUS | Status: AC
  Administered 2015-04-04: 11:00:00 via INTRAVENOUS
  Filled 2015-04-04: qty 5

## 2015-04-04 MED ORDER — DOCETAXEL CHEMO INJECTION 160 MG/16ML
70.0000 mg/m2 | Freq: Once | INTRAVENOUS | Status: AC
  Administered 2015-04-04: 120 mg via INTRAVENOUS
  Filled 2015-04-04: qty 12

## 2015-04-04 MED ORDER — SODIUM CHLORIDE 0.9 % IJ SOLN
10.0000 mL | INTRAMUSCULAR | Status: DC | PRN
  Administered 2015-04-04: 10 mL
  Filled 2015-04-04: qty 10

## 2015-04-04 MED ORDER — PALONOSETRON HCL INJECTION 0.25 MG/5ML
0.2500 mg | Freq: Once | INTRAVENOUS | Status: AC
  Administered 2015-04-04: 0.25 mg via INTRAVENOUS
  Filled 2015-04-04: qty 5

## 2015-04-04 MED ORDER — PEGFILGRASTIM 6 MG/0.6ML ~~LOC~~ PSKT
6.0000 mg | PREFILLED_SYRINGE | Freq: Once | SUBCUTANEOUS | Status: AC
  Administered 2015-04-04: 6 mg via SUBCUTANEOUS
  Filled 2015-04-04: qty 0.6

## 2015-04-04 MED ORDER — TRASTUZUMAB CHEMO INJECTION 440 MG
6.0000 mg/kg | Freq: Once | INTRAVENOUS | Status: AC
  Administered 2015-04-04: 399 mg via INTRAVENOUS
  Filled 2015-04-04: qty 19

## 2015-04-04 NOTE — Progress Notes (Signed)
[place] @ [facility] [phone]  Fax:([phone]) [place] Briddell OB: 01-Jul-1941  MR#: [id number]  AUQ#:[id number]  Patient Care Team: [name], [name], MD (General Surgery) Self Requested  CHIEF COMPLAINT:  Chief Complaint  Patient presents with  . Follow-up    VISIT DIAGNOSIS:     ICD-9-CM ICD-10-CM   1. Breast cancer metastasized to axillary lymph node, left 174.9 C50.912 CBC with Differential   196.3 C77.3 Comprehensive metabolic panel     Magnesium     Oncology History   1.  Abnormal mammogram of the left breast.  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  Progesterone receptor negative.  HER-2/neu receptor positive 2.  Patient is starting [facility] from July OF 2016     Breast cancer metastasized to axillary lymph node   01/24/2015 Initial Diagnosis Breast cancer metastasized to axillary lymph node    Oncology Flowsheet 01/08/2015 01/16/2015 01/31/2015 02/21/2015 03/14/2015  Day, Cycle - - Day 1, Cycle 1 Day 1, Cycle 2 Day 1, Cycle 3  CARBOplatin (PARAPLATIN) IV - - 470 mg 470 mg 470 mg  dexamethasone (DECADRON) IJ - - - - -  dexamethasone (DECADRON) IV - - [ 12 mg ] [ 12 mg ] [ 12 mg ]  diphenhydrAMINE (BENADRYL) PO - - 50 mg 50 mg -  DOCEtaxel (TAXOTERE) IV - - 70 mg/m2 70 mg/m2 70 mg/m2  fosaprepitant (EMEND) IV - - [ 150 mg ] [ 150 mg ] [ 150 mg ]  ondansetron (ZOFRAN) IV - - - - -  palonosetron (ALOXI) IV - - 0.25 mg 0.25 mg 0.25 mg  pegfilgrastim (NEULASTA ONPRO KIT) [place] - - 6 mg 6 mg 6 mg  trastuzumab (HERCEPTIN) IV - - 8 mg/kg 6 mg/kg 6 mg/kg    INTERVAL HISTORY:  74 year old lady who had a regular mammogram recent mammogram with abnormal ultrasound was done.  There were 3 nodules were found.   Patient underwent stereotactic ultrasound-guided biopsy.  3 nodules were 4 cm 5 cm in 6 cm away from nipple.  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  Slightly apprehensive but not any acute distress February 21, 2015 Patient is here for ongoing evaluation and treatment consideration Right hip pain continues to bother her.  Plain x-ray did not reveal any evidence of fracture.  MRI scan could not be done as patient could not stay still on the table.  A no tingling numbness appetite has been fairly good.  No nausea or vomiting. august17, 2016 Patient is here for ongoing evaluation and continuation of chemotherapy. Patient is starting cycle 3 of chemotherapy no nausea no vomiting no tingling numbness.  Appetite has been stable.  No cough or shortness of breath No soreness in the mouth Patient continues to have right hip pain.  Patient did not go for pain clinic management [place] felt better but again now right hip is bothering her  April 04, 2015 Patient is here for ongoing evaluation and continuation of chemotherapy for the next cycle No tingling.  No numbness.  Patient continues to have pain in the right hip area patient has  been evaluated by pain clinic physician and pain medication has been increased which is helped patient to control pain better.  No nausea.  No vomiting.  No soreness in the mouth.  Appetite has been stable. REVIEW OF SYSTEMS:   GENERAL:   Significant distress because of pain PERFORMANCE STATUS (ECOG): 0  HEENT:  No visual changes, runny nose, sore throat, mouth sores or tenderness. Lungs: No shortness of breath or cough.  No hemoptysis. Cardiac:  No chest pain, palpitations, orthopnea, or PND. GI:  No nausea, vomiting, diarrhea, constipation, melena or hematochezia. GU:  No urgency, frequency, dysuria, or  hematuria. Prior use of birth control pills several years ago.  Did not use any estrogen or any other hormone replacement therapy Musculoskeletal: Right hip pain .    Skin:  No rashes or skin changes. Neuro:  No headache, numbness or weakness, balance or coordination issues. Endocrine:  No diabetes, thyroid issues, hot flashes or night sweats. Psych:  No mood changes, depression or anxiety. Pain:  Severe right hip pain and pain in the low back area radiating down to right lower extremity Review of systems:  All other systems reviewed and found to be negative. As per HPI. Otherwise, a complete review of systems is negatve.  PAST MEDICAL HISTORY: Past Medical History  Diagnosis Date  . 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 Hearing loss   . Memory impairment     seen by Dr Manuella Ghazi  possible post crainiotomy from radiation  . 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    PAST SURGICAL HISTORY: Past Surgical History  Procedure Laterality Date  . Appendectomy  1950  . Brain surgery  1995    left frontal/temporal  . Colonoscopy  2010    Dr. [name]  . Breast biopsy Left 1997  . 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 Breast biopsy Left 01/08/2015    Procedure: BREAST BIOPSY WITH NEEDLE LOCALIZATION;  Surgeon: [name], MD;  Location: [facility];  Service: General;  Laterality: Left;  . Breast lumpectomy Left 01/08/2015    Procedure: LUMPECTOMY;  Surgeon: [name], MD;  Location: [facility];  Service: General;  Laterality: Left;  . Sentinel node biopsy Left 01/16/2015    Procedure: SENTINEL NODE BIOPSY;  Surgeon: [name], MD;  Location: [facility];  Service: General;  Laterality: Left;  . Portacath placement Right 01/16/2015    Procedure: INSERTION PORT-A-CATH;  Surgeon: [name], MD;  Location: [facility];  Service: General;  Laterality:  Right;    FAMILY HISTORY Family History  Problem Relation Age of Onset  . Breast cancer Sister 72  . Addison's disease Mother   . Stroke Father            HEALTH MAINTENANCE: Social History  Substance Use Topics  . Smoking status: Former Smoker    Quit date: 12/28/1964  . Smokeless tobacco: Never Used  . Alcohol Use: 0.6 - 1.2 oz/week    1-2 Glasses of wine per week     Comment: 1 Glass Wine / Night      No Known Allergies  Current Outpatient Prescriptions  Medication Sig Dispense Refill  . Ascorbic Acid (VITAMIN C) 100 MG tablet Take 100 mg by mouth every morning.     . Calcium Carbonate-Vit D-Min (CALTRATE 600+D PLUS MINERALS) 600-800 MG-UNIT TABS  Take 1 tablet by mouth every morning.    . calcium-vitamin D 250-100 MG-UNIT per tablet Take 1 tablet by mouth 1 day or 1 dose. 600 mg    . dexamethasone (DECADRON) 4 MG tablet Take 2 tablets (8 mg total) by mouth 2 (two) times daily. Start the day before Taxotere. Then again the day after chemo. 30 tablet 1  . gabapentin (NEURONTIN) 300 MG capsule Take 1 capsule (300 mg total) by mouth at bedtime. 30 capsule 3  . HYDROcodone-acetaminophen (NORCO/VICODIN) 5-325 MG per tablet Take 1 tablet by mouth once. 40 tablet 0  . lidocaine-prilocaine (EMLA) cream Apply 1 application topically as needed. 30 g 3  . LORazepam (ATIVAN) 0.5 MG tablet Take 1 tablet (0.5 mg total) by mouth every 6 (six) hours as needed (Nausea or vomiting). 30 tablet 0  . meloxicam (MOBIC) 7.5 MG tablet Take 7.5 mg by mouth every morning.     . ondansetron (ZOFRAN) 8 MG tablet Take 1 tablet (8 mg total) by mouth 2 (two) times daily. Start the day after chemo for 3 days. Then take as needed for nausea or vomiting. 30 tablet 1  . traMADol (ULTRAM) 50 MG tablet Take by mouth 3 (three) times daily.     . Triprolidine-Pseudoephedrine (ANTIHISTAMINE PO) Take by mouth every morning.      No current facility-administered medications for this visit.     OBJECTIVE: GENERAL:  Well developed, well nourished, sitting comfortably in the exam room in no acute distress. Fatigue  grade 1 MENTAL STATUS:  Alert and oriented to person, place and time.  ENT: No evidence of stomatitisI.  Alopecia  RESPIRATORY:  Clear to auscultation without rales, wheezes or rhonchi. CARDIOVASCULAR:  Regular rate and rhythm without murmur, rub or gallop. Port site within normal limit ABDOMEN:  Soft, non-tender, with active bowel sounds, and no hepatosplenomegaly.  No masses. BACK:  No CVA tenderness.  No tenderness on percussion of the back or rib cage. SKIN:  No rashes, ulcers or lesions. EXTREMITIES: No edema, no skin discoloration or tenderness.  No palpable cords. LYMPH NODES: No palpable cervical, supraclavicular, axillary or inguinal adenopathy  NEUROLOGICAL: Unremarkable. PSYCH:  Appropriate.  Filed Vitals:   04/04/15 0911  BP: 131/71  Pulse: 86  Temp: 98.7 F (37.1 C)     Body mass index is 24.39 kg/(m^2).    ECOG FS:1 - Symptomatic but completely ambulatory  LAB RESULTS:  Office Visit on 04/04/2015  Component Date Value Ref Range Status  . WBC 04/04/2015 7.0  3.6 - 11.0 K/uL Final   A-LINE DRAW  . RBC 04/04/2015 3.59* 3.80 - 5.20 MIL/uL Final  . Hemoglobin 04/04/2015 10.8* 12.0 - 16.0 g/dL Final  . HCT 04/04/2015 32.4* 35.0 - 47.0 % Final  . MCV 04/04/2015 90.2  80.0 - 100.0 fL Final  . MCH 04/04/2015 30.2  26.0 - 34.0 pg Final  . MCHC 04/04/2015 33.5  32.0 - 36.0 g/dL Final  . RDW 04/04/2015 17.6* 11.5 - 14.5 % Final  . Platelets 04/04/2015 231  150 - 440 K/uL Final  . Neutrophils Relative % 04/04/2015 89   Final  . Neutro Abs 04/04/2015 6.2  1.4 - 6.5 K/uL Final  . Lymphocytes Relative 04/04/2015 9   Final  . Lymphs Abs 04/04/2015 0.6* 1.0 - 3.6 K/uL Final  . Monocytes Relative 04/04/2015 2   Final  . Monocytes Absolute 04/04/2015 0.2  0.2 - 0.9 K/uL Final  . Eosinophils Relative 04/04/2015 0   Final  . Eosinophils  Absolute  04/04/2015 0.0  0 - 0.7 K/uL Final  . Basophils Relative 04/04/2015 0   Final  . Basophils Absolute 04/04/2015 0.0  0 - 0.1 K/uL Final  . Sodium 04/04/2015 135  135 - 145 mmol/L Final  . Potassium 04/04/2015 3.5  3.5 - 5.1 mmol/L Final  . Chloride 04/04/2015 100* 101 - 111 mmol/L Final  . CO2 04/04/2015 24  22 - 32 mmol/L Final  . Glucose, Bld 04/04/2015 173* 65 - 99 mg/dL Final  . BUN 04/04/2015 16  6 - 20 mg/dL Final  . Creatinine, Ser 04/04/2015 0.66  0.44 - 1.00 mg/dL Final  . Calcium 04/04/2015 8.6* 8.9 - 10.3 mg/dL Final  . Total Protein 04/04/2015 6.6  6.5 - 8.1 g/dL Final  . Albumin 04/04/2015 3.8  3.5 - 5.0 g/dL Final  . AST 04/04/2015 33  15 - 41 U/L Final  . ALT 04/04/2015 17  14 - 54 U/L Final  . Alkaline Phosphatase 04/04/2015 58  38 - 126 U/L Final  . Total Bilirubin 04/04/2015 0.5  0.3 - 1.2 mg/dL Final  . GFR calc non Af Amer 04/04/2015 >60  >60 mL/min Final  . GFR calc Af Amer 04/04/2015 >60  >60 mL/min Final   Comment: (NOTE) The eGFR has been calculated using the CKD EPI equation. This calculation has not been validated in all clinical situations. eGFR's persistently <60 mL/min signify possible Chronic Kidney Disease.   . Anion gap 04/04/2015 11  5 - 15 Final  . Magnesium 04/04/2015 1.8  1.7 - 2.4 mg/dL Final     STUDIES: No results found.  ASSESSMENT:  74 -year-old lady with a history of carcinoma breast.  Stage  0 tumor carcinoma in situ Status post biopsy. right hip pain Previous history of meningioma treated with radiation therapy We will proceed with chemotherapy with TCH cycle 4. 2.  Continue second cycle of chemotherapy patient did not have any significant side effect except for started losing hair 3.  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 Because of severe pain MRI scan has been skin so.  Patient also has been referred to pain clinic to Dr. Andree Elk She needs a CBC done before any procedure planned to be sure patient is not neutropenic or  thrombocytopenic otherwise is safe to proceed with epidural injection  Possibility of pain clinic appointment for control pain while patient is on chemotherapy I will discuss situation with orthopedic surgeon for possibility of steroid injection in the right hip area for temporary relief in the pain  T1c N1 C micro-M0 Estrogen and progesterone receptor negative HER-2 receptor positive   Staging form: Breast, AJCC 7th Edition    [name], MD   04/04/2015 9:34 AM

## 2015-04-04 NOTE — Progress Notes (Signed)
Patient does have living will.  Former smoker, 

## 2015-04-05 ENCOUNTER — Other Ambulatory Visit: Payer: Self-pay | Admitting: Oncology

## 2015-04-11 ENCOUNTER — Telehealth: Payer: Self-pay | Admitting: *Deleted

## 2015-04-11 ENCOUNTER — Inpatient Hospital Stay (HOSPITAL_BASED_OUTPATIENT_CLINIC_OR_DEPARTMENT_OTHER): Payer: Medicare Other | Admitting: Oncology

## 2015-04-11 ENCOUNTER — Inpatient Hospital Stay: Payer: Medicare Other

## 2015-04-11 ENCOUNTER — Ambulatory Visit
Admission: RE | Admit: 2015-04-11 | Discharge: 2015-04-11 | Disposition: A | Discharge status: Home / Self Care | Payer: Medicare Other | Source: Ambulatory Visit | Attending: Oncology | Admitting: Oncology

## 2015-04-11 VITALS — BP 115/71 | HR 100 | Temp 97.9°F | Wt 143.5 lb

## 2015-04-11 DIAGNOSIS — J9811 Atelectasis: Secondary | ICD-10-CM | POA: Diagnosis not present

## 2015-04-11 DIAGNOSIS — Z5111 Encounter for antineoplastic chemotherapy: Secondary | ICD-10-CM | POA: Diagnosis not present

## 2015-04-11 DIAGNOSIS — C50912 Malignant neoplasm of unspecified site of left female breast: Secondary | ICD-10-CM

## 2015-04-11 DIAGNOSIS — C773 Secondary and unspecified malignant neoplasm of axilla and upper limb lymph nodes: Secondary | ICD-10-CM | POA: Diagnosis not present

## 2015-04-11 DIAGNOSIS — R059 Cough, unspecified: Secondary | ICD-10-CM

## 2015-04-11 DIAGNOSIS — Z5189 Encounter for other specified aftercare: Secondary | ICD-10-CM

## 2015-04-11 DIAGNOSIS — R05 Cough: Secondary | ICD-10-CM | POA: Insufficient documentation

## 2015-04-11 DIAGNOSIS — J069 Acute upper respiratory infection, unspecified: Secondary | ICD-10-CM | POA: Diagnosis not present

## 2015-04-11 DIAGNOSIS — Z79899 Other long term (current) drug therapy: Secondary | ICD-10-CM

## 2015-04-11 DIAGNOSIS — D0512 Intraductal carcinoma in situ of left breast: Secondary | ICD-10-CM

## 2015-04-11 DIAGNOSIS — Z171 Estrogen receptor negative status [ER-]: Secondary | ICD-10-CM

## 2015-04-11 DIAGNOSIS — Z853 Personal history of malignant neoplasm of breast: Secondary | ICD-10-CM | POA: Diagnosis not present

## 2015-04-11 DIAGNOSIS — M1611 Unilateral primary osteoarthritis, right hip: Secondary | ICD-10-CM

## 2015-04-11 LAB — CBC WITH DIFFERENTIAL/PLATELET
BASOS PCT: 0 %
Basophils Absolute: 0 10*3/uL (ref 0–0.1)
EOS ABS: 0 10*3/uL (ref 0–0.7)
EOS PCT: 0 %
HCT: 32.5 % — ABNORMAL LOW (ref 35.0–47.0)
Hemoglobin: 10.7 g/dL — ABNORMAL LOW (ref 12.0–16.0)
Lymphocytes Relative: 6 %
Lymphs Abs: 0.8 10*3/uL — ABNORMAL LOW (ref 1.0–3.6)
MCH: 30.1 pg (ref 26.0–34.0)
MCHC: 32.8 g/dL (ref 32.0–36.0)
MCV: 91.5 fL (ref 80.0–100.0)
MONO ABS: 1.3 10*3/uL — AB (ref 0.2–0.9)
MONOS PCT: 11 %
Neutro Abs: 9.8 10*3/uL — ABNORMAL HIGH (ref 1.4–6.5)
Neutrophils Relative %: 83 %
Platelets: 210 10*3/uL (ref 150–440)
RBC: 3.55 MIL/uL — ABNORMAL LOW (ref 3.80–5.20)
RDW: 18.4 % — AB (ref 11.5–14.5)
WBC: 11.9 10*3/uL — ABNORMAL HIGH (ref 3.6–11.0)

## 2015-04-11 MED ORDER — AZITHROMYCIN 500 MG PO TABS: 500.0000 mg | ORAL_TABLET | Freq: Every day | ORAL | Status: DC

## 2015-04-11 MED ORDER — HYDROCODONE-ACETAMINOPHEN 5-325 MG PO TABS: 1.0000 | ORAL_TABLET | Freq: Once | ORAL | Status: DC

## 2015-04-11 NOTE — Telephone Encounter (Signed)
Informed that prescription is ready to pick up  

## 2015-04-11 NOTE — Telephone Encounter (Signed)
For past 2 - 3 days she has had a progressing productive cough. No fever or chest pain not sob, but breathing is "heavy" Coughing up yellow almost green sputum. No nasal congestion no rattling in lungs. Has lab appt today. Per Dr Oliva Bustard, pt to come in for cxr, get labs and see him today. They will come over to hospital at 9:45 for cxr then come over to lab 1045 and see md at 109

## 2015-04-12 ENCOUNTER — Encounter: Payer: Self-pay | Admitting: Oncology

## 2015-04-12 NOTE — Progress Notes (Signed)
[place] @ [facility] [phone]  Fax:([phone] [place]: Oct 15, 1940  MR#: [id number]  GMW#:[id number]  Patient Care Team: [name], [name], MD (General Surgery) Self Requested  CHIEF COMPLAINT:  No chief complaint on file.  Oncology History   1.  Abnormal mammogram of the left breast.  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  Progesterone receptor negative.  HER-2/neu receptor positive 2.  Patient is starting [facility] from July OF 2016      VISIT DIAGNOSIS:     ICD-9-CM ICD-10-CM   1. Breast cancer metastasized to axillary lymph node, left 174.9 C50.912 azithromycin (ZITHROMAX) 500 MG tablet   196.3 C77.3        Oncology Flowsheet 01/08/2015 01/16/2015 01/31/2015 02/21/2015 03/14/2015 04/04/2015  Day, Cycle - - Day 1, Cycle 1 Day 1, Cycle 2 Day 1, Cycle 3 Day 1, Cycle 4  CARBOplatin (PARAPLATIN) IV - - 470 mg 470 mg 470 mg 470 mg  dexamethasone (DECADRON) IJ - - - - - -  dexamethasone (DECADRON) IV - - [ 12 mg ] [ 12 mg ] [ 12 mg ] [ 12 mg ]  diphenhydrAMINE (BENADRYL) PO - - 50 mg 50 mg - -  DOCEtaxel (TAXOTERE) IV - - 70 mg/m2 70 mg/m2 70 mg/m2 70 mg/m2  fosaprepitant (EMEND) IV - - [ 150 mg ] [ 150 mg ] [ 150 mg ] [ 150 mg ]  ondansetron (ZOFRAN) IV - - - - - -  palonosetron (ALOXI) IV - - 0.25 mg 0.25 mg 0.25 mg 0.25 mg  pegfilgrastim (NEULASTA ONPRO KIT) [place] - - 6 mg 6 mg 6 mg 6 mg  trastuzumab (HERCEPTIN) IV - - 8 mg/kg 6 mg/kg 6 mg/kg 6 mg/kg    INTERVAL HISTORY:  74 year old lady who had a regular mammogram recent mammogram with abnormal ultrasound was done.  There were 3 nodules were found.  Patient underwent stereotactic ultrasound-guided biopsy.  3 nodules were 4 cm 5 cm in 6 cm away from  nipple.  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  Slightly apprehensive but not any acute distress February 21, 2015 Patient is here for ongoing evaluation and treatment consideration Right hip pain continues to bother her.  Plain x-ray did not reveal any evidence of fracture.  MRI scan could not be done as patient could not stay still on the table.  A no tingling numbness appetite has been fairly good.  No nausea or vomiting. august17, 2016 Patient is here for ongoing evaluation and continuation of chemotherapy. Patient is starting cycle 3 of chemotherapy no nausea no vomiting no tingling numbness.  Appetite has been stable.  No cough or shortness of breath No soreness in the mouth Patient continues to have right hip pain.  Patient did not go for pain clinic management  felt better but again now right hip is bothering her  April 04, 2015 Patient is here for ongoing evaluation and continuation of chemotherapy for the next cycle No tingling.  No numbness.  Patient continues to have pain in the right hip area patient has been evaluated by pain  clinic physician and pain medication has been increased which is helped patient to control pain better.  No nausea.  No vomiting.  No soreness in the mouth.  Appetite has been stable. April 11, 2015 Patient came as an add on complaining of cough yellowish and greenish expectoration. Chest tightness.  No fever.  Upper respiratory tract congestion. In view of last chemotherapy patient had a chest x-ray as well as CBC done REVIEW OF SYSTEMS:   GENERAL:   Feeling somewhat weak and tired PERFORMANCE STATUS (ECOG): 0  HEENT:  No visual changes, runny nose, sore throat, mouth sores or tenderness. Lungs: Cough and chest tightness. Cardiac:  No chest pain, palpitations, orthopnea, or PND. GI:  No  nausea, vomiting, diarrhea, constipation, melena or hematochezia. GU:  No urgency, frequency, dysuria, or hematuria. Prior use of birth control pills several years ago.  Did not use any estrogen or any other hormone replacement therapy Musculoskeletal: Right hip pain .    Skin:  No rashes or skin changes. Neuro:  No headache, numbness or weakness, balance or coordination issues. Endocrine:  No diabetes, thyroid issues, hot flashes or night sweats. Psych:  No mood changes, depression or anxiety. Pain:  Severe right hip pain and pain in the low back area radiating down to right lower extremity Review of systems:  All other systems reviewed and found to be negative. As per HPI. Otherwise, a complete review of systems is negatve.  PAST MEDICAL HISTORY: Past Medical History  Diagnosis Date  . 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 Hearing loss   . Memory impairment     seen by Dr Manuella Ghazi  possible post crainiotomy from radiation  . 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    PAST SURGICAL HISTORY: Past Surgical History  Procedure Laterality Date  . Appendectomy  1950  . Brain surgery  1995    left frontal/temporal  . Colonoscopy  2010    Dr. [name]  . Breast biopsy Left 1997  . 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 Breast biopsy Left 01/08/2015    Procedure: BREAST BIOPSY WITH NEEDLE LOCALIZATION;  Surgeon: [name], MD;  Location: [facility] ORS;  Service: General;  Laterality: Left;  . Breast lumpectomy Left 01/08/2015    Procedure: LUMPECTOMY;  Surgeon: [name], MD;  Location: [facility] ORS;  Service: General;  Laterality: Left;  . Sentinel node biopsy Left 01/16/2015    Procedure: SENTINEL NODE BIOPSY;  Surgeon: [name], MD;  Location: [facility] ORS;  Service: General;  Laterality: Left;  . Portacath placement Right 01/16/2015    Procedure:  INSERTION PORT-A-CATH;  Surgeon: [name], MD;  Location: [facility] ORS;  Service: General;  Laterality: Right;    FAMILY HISTORY Family History  Problem Relation Age of Onset  . Breast cancer Sister 36  . Addison's disease Mother   . Stroke Father            HEALTH MAINTENANCE: Social History  Substance Use Topics  . Smoking status: Former Smoker    Quit date: 12/28/1964  . Smokeless tobacco: Never Used  . Alcohol Use: 0.6 - 1.2 oz/week    1-2 Glasses of wine per week     Comment: 1 Glass Wine / Night      No Known Allergies  Current Outpatient Prescriptions  Medication Sig Dispense  Refill  . Ascorbic Acid (VITAMIN C) 100 MG tablet Take 100 mg by mouth every morning.     . Calcium Carbonate-Vit D-Min (CALTRATE 600+D PLUS MINERALS) 600-800 MG-UNIT TABS Take 1 tablet by mouth every morning.    . calcium-vitamin D 250-100 MG-UNIT per tablet Take 1 tablet by mouth 1 day or 1 dose. 600 mg    . dexamethasone (DECADRON) 4 MG tablet Take 2 tablets (8 mg total) by mouth 2 (two) times daily. Start the day before Taxotere. Then again the day after chemo. 30 tablet 1  . dexamethasone (DECADRON) 4 MG tablet TAKE TWO TABLETS TWICE A DAY. START THE DAY BEFORE TAXOTERE, THEN AGAIN THE DAY AFTER CHEMO FOR 3 DAYS 20 tablet 0  . gabapentin (NEURONTIN) 300 MG capsule Take 1 capsule (300 mg total) by mouth at bedtime. 30 capsule 3  . lidocaine-prilocaine (EMLA) cream Apply 1 application topically as needed. 30 g 3  . LORazepam (ATIVAN) 0.5 MG tablet Take 1 tablet (0.5 mg total) by mouth every 6 (six) hours as needed (Nausea or vomiting). 30 tablet 0  . meloxicam (MOBIC) 7.5 MG tablet Take 7.5 mg by mouth every morning.     . ondansetron (ZOFRAN) 8 MG tablet Take 1 tablet (8 mg total) by mouth 2 (two) times daily. Start the day after chemo for 3 days. Then take as needed for nausea or vomiting. 30 tablet 1  . traMADol (ULTRAM) 50 MG tablet Take by mouth 3 (three) times daily.     .  Triprolidine-Pseudoephedrine (ANTIHISTAMINE PO) Take by mouth every morning.     Marland Kitchen azithromycin (ZITHROMAX) 500 MG tablet Take 1 tablet (500 mg total) by mouth daily. 5 tablet 0  . HYDROcodone-acetaminophen (NORCO/VICODIN) 5-325 MG per tablet Take 1 tablet by mouth once. 40 tablet 0   No current facility-administered medications for this visit.    OBJECTIVE: GENERAL:  Well developed, well nourished, sitting comfortably in the exam room in no acute distress. Fatigue  grade 1 MENTAL STATUS:  Alert and oriented to person, place and time.  ENT: No evidence of stomatitisI.  Alopecia  RESPIRATORY:  Clear to auscultation without rales, wheezes or rhonchi. CARDIOVASCULAR:  Regular rate and rhythm without murmur, rub or gallop. Port site within normal limit ABDOMEN:  Soft, non-tender, with active bowel sounds, and no hepatosplenomegaly.  No masses. BACK:  No CVA tenderness.  No tenderness on percussion of the back or rib cage. SKIN:  No rashes, ulcers or lesions. EXTREMITIES: No edema, no skin discoloration or tenderness.  No palpable cords. LYMPH NODES: No palpable cervical, supraclavicular, axillary or inguinal adenopathy  NEUROLOGICAL: Unremarkable. PSYCH:  Appropriate.  Filed Vitals:   04/11/15 1200  BP: 115/71  Pulse: 100  Temp: 97.9 F (36.6 C)     Body mass index is 24.26 kg/(m^2).    ECOG FS:1 - Symptomatic but completely ambulatory  LAB RESULTS:  Appointment on 04/11/2015  Component Date Value Ref Range Status  . WBC 04/11/2015 11.9* 3.6 - 11.0 K/uL Final  . RBC 04/11/2015 3.55* 3.80 - 5.20 MIL/uL Final  . Hemoglobin 04/11/2015 10.7* 12.0 - 16.0 g/dL Final  . HCT 04/11/2015 32.5* 35.0 - 47.0 % Final  . MCV 04/11/2015 91.5  80.0 - 100.0 fL Final  . MCH 04/11/2015 30.1  26.0 - 34.0 pg Final  . MCHC 04/11/2015 32.8  32.0 - 36.0 g/dL Final  . RDW 04/11/2015 18.4* 11.5 - 14.5 % Final  . Platelets 04/11/2015 210  150 - 440 K/uL Final  .  Neutrophils Relative % 04/11/2015 83    Final  . Neutro Abs 04/11/2015 9.8* 1.4 - 6.5 K/uL Final  . Lymphocytes Relative 04/11/2015 6   Final  . Lymphs Abs 04/11/2015 0.8* 1.0 - 3.6 K/uL Final  . Monocytes Relative 04/11/2015 11   Final  . Monocytes Absolute 04/11/2015 1.3* 0.2 - 0.9 K/uL Final  . Eosinophils Relative 04/11/2015 0   Final  . Eosinophils Absolute 04/11/2015 0.0  0 - 0.7 K/uL Final  . Basophils Relative 04/11/2015 0   Final  . Basophils Absolute 04/11/2015 0.0  0 - 0.1 K/uL Final     STUDIES: Dg Chest 2 View  04/11/2015   CLINICAL DATA:  Productive cough, history of breast carcinoma  EXAM: CHEST  2 VIEW  COMPARISON:  Chest x-ray of 01/16/2015  FINDINGS: Minimally prominent linear markings are noted at the lung bases left-greater-than-right, most consistent with atelectasis, not being present previously. No pleural effusion is seen. A right-sided Port-A-Cath is present with the tip seen to the expected SVC -RA junction. No pneumothorax is seen. Heart size is normal.  IMPRESSION: 1. Right-sided Port-A-Cath tip overlies the expected SVC -RA junction. 2. Bibasilar linear atelectasis left-greater-than-right.   Electronically Signed   By: Ivar Drape M.D.   On: 04/11/2015 10:39    ASSESSMENT:  74 -year-old lady with a history of carcinoma breast.  Stage  0 tumor carcinoma in situ Status post biopsy. right hip pain Previous history of meningioma treated with radiation therapy We will proceed with chemotherapy with TCH cycle 4. 2.  Continue second cycle of chemotherapy patient did not have any significant side effect except for started losing hair 3.  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 Because of severe pain MRI scan has been skin so.  Patient also has been referred to pain clinic to Dr. Andree Elk She needs a CBC done before any procedure planned to be sure patient is not neutropenic or thrombocytopenic otherwise is safe to proceed with epidural injection September 14, Acute respiratory tract infection Chest x-ray  was reviewed and shows only bilateral atelectasis no evidence of pneumonia Zithromax 500 mg daily for 5 days To call me if symptoms do not improve Possibility of pain clinic appointment for control pain while patient is on chemotherapy I will discuss situation with orthopedic surgeon for possibility of steroid injection in the right hip area for temporary relief in the pain  T1c N1 C micro-M0 Estrogen and progesterone receptor negative HER-2 receptor positive   Staging form: Breast, AJCC 7th Edition    [name], MD   04/12/2015 7:56 AM

## 2015-04-18 ENCOUNTER — Inpatient Hospital Stay: Payer: Medicare Other

## 2015-04-18 DIAGNOSIS — Z5111 Encounter for antineoplastic chemotherapy: Secondary | ICD-10-CM | POA: Diagnosis not present

## 2015-04-18 DIAGNOSIS — C773 Secondary and unspecified malignant neoplasm of axilla and upper limb lymph nodes: Principal | ICD-10-CM

## 2015-04-18 DIAGNOSIS — C50912 Malignant neoplasm of unspecified site of left female breast: Secondary | ICD-10-CM

## 2015-04-18 LAB — CBC WITH DIFFERENTIAL/PLATELET
Basophils Absolute: 0 10*3/uL (ref 0–0.1)
Basophils Relative: 0 %
EOS ABS: 0 10*3/uL (ref 0–0.7)
EOS PCT: 0 %
HCT: 31.6 % — ABNORMAL LOW (ref 35.0–47.0)
HEMOGLOBIN: 10.7 g/dL — AB (ref 12.0–16.0)
LYMPHS ABS: 0.9 10*3/uL — AB (ref 1.0–3.6)
LYMPHS PCT: 10 %
MCH: 31 pg (ref 26.0–34.0)
MCHC: 33.7 g/dL (ref 32.0–36.0)
MCV: 92.1 fL (ref 80.0–100.0)
MONOS PCT: 5 %
Monocytes Absolute: 0.4 10*3/uL (ref 0.2–0.9)
Neutro Abs: 7.3 10*3/uL — ABNORMAL HIGH (ref 1.4–6.5)
Neutrophils Relative %: 85 %
PLATELETS: 290 10*3/uL (ref 150–440)
RBC: 3.43 MIL/uL — ABNORMAL LOW (ref 3.80–5.20)
RDW: 18.2 % — ABNORMAL HIGH (ref 11.5–14.5)
WBC: 8.7 10*3/uL (ref 3.6–11.0)

## 2015-04-19 ENCOUNTER — Ambulatory Visit: Payer: Medicare Other | Attending: Pain Medicine | Admitting: Pain Medicine

## 2015-04-19 ENCOUNTER — Encounter: Payer: Self-pay | Admitting: Pain Medicine

## 2015-04-19 VITALS — BP 112/62 | HR 88 | Temp 97.5°F | Resp 18 | Ht 64.0 in | Wt 140.0 lb

## 2015-04-19 DIAGNOSIS — C50912 Malignant neoplasm of unspecified site of left female breast: Secondary | ICD-10-CM

## 2015-04-19 DIAGNOSIS — M5136 Other intervertebral disc degeneration, lumbar region: Secondary | ICD-10-CM | POA: Insufficient documentation

## 2015-04-19 DIAGNOSIS — M545 Low back pain: Secondary | ICD-10-CM | POA: Diagnosis present

## 2015-04-19 DIAGNOSIS — M533 Sacrococcygeal disorders, not elsewhere classified: Secondary | ICD-10-CM | POA: Diagnosis not present

## 2015-04-19 DIAGNOSIS — C50911 Malignant neoplasm of unspecified site of right female breast: Secondary | ICD-10-CM | POA: Diagnosis not present

## 2015-04-19 DIAGNOSIS — M1610 Unilateral primary osteoarthritis, unspecified hip: Secondary | ICD-10-CM | POA: Insufficient documentation

## 2015-04-19 DIAGNOSIS — M158 Other polyosteoarthritis: Secondary | ICD-10-CM

## 2015-04-19 DIAGNOSIS — C773 Secondary and unspecified malignant neoplasm of axilla and upper limb lymph nodes: Secondary | ICD-10-CM

## 2015-04-19 DIAGNOSIS — M79604 Pain in right leg: Secondary | ICD-10-CM | POA: Diagnosis present

## 2015-04-19 DIAGNOSIS — M12819 Other specific arthropathies, not elsewhere classified, unspecified shoulder: Secondary | ICD-10-CM

## 2015-04-19 DIAGNOSIS — M79605 Pain in left leg: Secondary | ICD-10-CM | POA: Diagnosis present

## 2015-04-19 MED ORDER — HYDROCODONE-ACETAMINOPHEN 5-325 MG PO TABS: ORAL_TABLET | ORAL | Status: DC

## 2015-04-19 MED ORDER — GABAPENTIN 300 MG PO CAPS: ORAL_CAPSULE | ORAL | Status: DC

## 2015-04-19 MED ORDER — MELOXICAM 7.5 MG PO TABS: ORAL_TABLET | ORAL | Status: DC

## 2015-04-19 MED ORDER — TRAMADOL HCL 50 MG PO TABS: ORAL_TABLET | ORAL | Status: DC

## 2015-04-19 NOTE — Progress Notes (Signed)
   Subjective:    Patient ID: [name], female    DOB: 1940/11/26, 74 y.o.   MRN: [id number]  HPI  The patient is a 74 year old female returns to [place] for further evaluation and treatment of pain involving the lower back lower extremity region and region of the hip. Patient is with diagnosis of carcinoma the rest with pain fairly well-controlled with present treatment regimen. We discussed patient's condition on today's visit and adjusted medications. We will continue noninterventional treatment measures and will continue patient's Neurontin Mobidic tramadol and Vicodin as discussed on today's visit. The patient was in agreement with suggested treatment plan.once patient has completed treatment for carcinoma the breast patient will follow-up with her surgeon to consider surgical intervention for treatment of pain involving the region of the hip and lower extremity region. All in agreement with suggested treatment plan   Review of Systems     Objective:   Physical Exam  There was mild tends to palpation of the splenius capitis and occipitalis musculature region. Palpation over the cervical facet cervical paraspinal musculature region and the thoracic facet thoracic paraspinal musculature region reproduced mild discomfort. There was mild tinnitus of the acromioclavicular and glenohumeral joint regions. Tinel and Phalen's maneuver were without increased pain of significant degree. Patient was with bilaterally equal grip strength. Palpation over the lower thoracic paraspinal musculature region was associated with mild to moderate discomfort. No crepitus of the thoracic region was noted. Palpation over the lumbar paraspinal musculature region lumbar facet region associated with mild to moderate discomfort. There was tenderness over the PSIS PII S region as well as the gluteal and piriformis musculature region with tenderness to palpation of the greater trochanteric region  iliotibial band region as well.there was increased pain with Patrick's maneuver as well as increased pain with pressure applied to the ilium with patient in lateral decubitus position. EHL strength appeared to be equal. No definite sensory deficit of dermatomal distribution detected. There appeared to be negative clonus negative Homans. DTRs were difficult to elicit and appeared to be trace at the knees. EHL strength was decreased. No sensory deficit of dermatomal distribution detected. There was negative clonus negative Homans. Abdomen was nontender with no costovertebral angle tenderness noted.      Assessment & Plan:     Degenerative joint disease of hip    Degenerative disc disease lumbar spine   Lumbar facet syndrome   Sacroiliac joint dysfunction   Carcinoma of right breast    PLAN   Continue present medication Neurontin Mobidic tramadol and hydrocodone acetaminophen  F/U PCP Dr. Ramonita Lab for evaliation of  BP and general medical  Condition  F/U Dr Oliva Bustard as planned  F/U surgical evaluation.Patient will follow-up with Dr. Rosanna Randy and Dr. Jonny Ruiz as discussed  F/U neurological evaluation. May consider pending follow-up evaluations  May consider radiofrequency rhizolysis or intraspinal procedures pending response to present treatment and F/U evaluation  At the present time we plan to avoid interventional treatment for treatment of patient's pain  Patient to call [facility] should patient have concerns prior to scheduled return appointment.

## 2015-04-19 NOTE — Progress Notes (Signed)
Safety precautions to be maintained throughout the outpatient stay will include: orient to surroundings, keep bed in low position, maintain call bell within reach at all times, provide assistance with transfer out of bed and ambulation.  

## 2015-04-19 NOTE — Patient Instructions (Addendum)
PLAN   Continue present medication Neurontin Mobic tramadol and hydrocodone acetaminophen  F/U PCP Dr. Ramonita Lab for evaliation of  BP and general medical  Condition  F/U Dr.Choksi as planned  F/U surgical evaluation. Follow-up Dr. Rosanna Randy and Dr. Jonny Ruiz as discussed  F/U neurological evaluation. May consider pending follow-up evaluations  May consider radiofrequency rhizolysis or intraspinal procedures pending response to present treatment and F/U evaluation   Patient to call [facility] should patient have concerns prior to scheduled return appointment.

## 2015-04-23 ENCOUNTER — Telehealth: Payer: Self-pay | Admitting: Pain Medicine

## 2015-04-23 ENCOUNTER — Other Ambulatory Visit: Payer: Self-pay | Admitting: Oncology

## 2015-04-23 NOTE — Telephone Encounter (Signed)
Pharmacy asking what Dr. Primus Bravo gave her. Informed of Hydrocodone, Gabapentin, Meloxicam, and Tramadol.

## 2015-04-23 NOTE — Telephone Encounter (Signed)
Did patient get any medications from dr crisp on last visit ?

## 2015-04-24 NOTE — Telephone Encounter (Signed)
error 

## 2015-04-25 ENCOUNTER — Encounter: Payer: Self-pay | Admitting: Oncology

## 2015-04-25 ENCOUNTER — Inpatient Hospital Stay: Payer: Medicare Other

## 2015-04-25 ENCOUNTER — Inpatient Hospital Stay (HOSPITAL_BASED_OUTPATIENT_CLINIC_OR_DEPARTMENT_OTHER): Payer: Medicare Other | Admitting: Oncology

## 2015-04-25 VITALS — BP 110/66 | HR 84 | Temp 98.0°F | Resp 20

## 2015-04-25 VITALS — BP 133/74 | HR 85 | Temp 96.5°F | Wt 142.2 lb

## 2015-04-25 DIAGNOSIS — J9811 Atelectasis: Secondary | ICD-10-CM | POA: Diagnosis not present

## 2015-04-25 DIAGNOSIS — C773 Secondary and unspecified malignant neoplasm of axilla and upper limb lymph nodes: Principal | ICD-10-CM

## 2015-04-25 DIAGNOSIS — C50912 Malignant neoplasm of unspecified site of left female breast: Secondary | ICD-10-CM

## 2015-04-25 DIAGNOSIS — D0512 Intraductal carcinoma in situ of left breast: Secondary | ICD-10-CM | POA: Diagnosis not present

## 2015-04-25 DIAGNOSIS — Z79899 Other long term (current) drug therapy: Secondary | ICD-10-CM

## 2015-04-25 DIAGNOSIS — M1611 Unilateral primary osteoarthritis, right hip: Secondary | ICD-10-CM

## 2015-04-25 DIAGNOSIS — Z418 Encounter for other procedures for purposes other than remedying health state: Secondary | ICD-10-CM

## 2015-04-25 DIAGNOSIS — Z5111 Encounter for antineoplastic chemotherapy: Secondary | ICD-10-CM | POA: Diagnosis not present

## 2015-04-25 DIAGNOSIS — Z171 Estrogen receptor negative status [ER-]: Secondary | ICD-10-CM | POA: Diagnosis not present

## 2015-04-25 LAB — COMPREHENSIVE METABOLIC PANEL
ALBUMIN: 3.9 g/dL (ref 3.5–5.0)
ALT: 21 U/L (ref 14–54)
ANION GAP: 11 (ref 5–15)
AST: 36 U/L (ref 15–41)
Alkaline Phosphatase: 70 U/L (ref 38–126)
BUN: 15 mg/dL (ref 6–20)
CHLORIDE: 99 mmol/L — AB (ref 101–111)
CO2: 25 mmol/L (ref 22–32)
CREATININE: 0.78 mg/dL (ref 0.44–1.00)
Calcium: 8.8 mg/dL — ABNORMAL LOW (ref 8.9–10.3)
GFR calc non Af Amer: >60 mL/min (ref >60)
Glucose, Bld: 186 mg/dL — ABNORMAL HIGH (ref 65–99)
Potassium: 3.5 mmol/L (ref 3.5–5.1)
SODIUM: 135 mmol/L (ref 135–145)
Total Bilirubin: 0.4 mg/dL (ref 0.3–1.2)
Total Protein: 6.9 g/dL (ref 6.5–8.1)

## 2015-04-25 LAB — CBC WITH DIFFERENTIAL/PLATELET
BASOS PCT: 0 %
Basophils Absolute: 0 10*3/uL (ref 0–0.1)
EOS ABS: 0 10*3/uL (ref 0–0.7)
EOS PCT: 0 %
HCT: 34 % — ABNORMAL LOW (ref 35.0–47.0)
Hemoglobin: 11.3 g/dL — ABNORMAL LOW (ref 12.0–16.0)
LYMPHS ABS: 0.7 10*3/uL — AB (ref 1.0–3.6)
Lymphocytes Relative: 8 %
MCH: 30.5 pg (ref 26.0–34.0)
MCHC: 33.3 g/dL (ref 32.0–36.0)
MCV: 91.5 fL (ref 80.0–100.0)
Monocytes Absolute: 0.1 10*3/uL — ABNORMAL LOW (ref 0.2–0.9)
Monocytes Relative: 1 %
Neutro Abs: 8.6 10*3/uL — ABNORMAL HIGH (ref 1.4–6.5)
Neutrophils Relative %: 91 %
PLATELETS: 338 10*3/uL (ref 150–440)
RBC: 3.72 MIL/uL — AB (ref 3.80–5.20)
RDW: 18.5 % — ABNORMAL HIGH (ref 11.5–14.5)
WBC: 9.5 10*3/uL (ref 3.6–11.0)

## 2015-04-25 LAB — MAGNESIUM: Magnesium: 1.8 mg/dL (ref 1.7–2.4)

## 2015-04-25 MED ORDER — SODIUM CHLORIDE 0.9 % IV SOLN
Freq: Once | INTRAVENOUS | Status: AC
  Administered 2015-04-25: 10:00:00 via INTRAVENOUS
  Filled 2015-04-25: qty 1000

## 2015-04-25 MED ORDER — HEPARIN SOD (PORK) LOCK FLUSH 100 UNIT/ML IV SOLN
500.0000 [IU] | Freq: Once | INTRAVENOUS | Status: AC | PRN
  Administered 2015-04-25: 500 [IU]
  Filled 2015-04-25: qty 5

## 2015-04-25 MED ORDER — PEGFILGRASTIM 6 MG/0.6ML ~~LOC~~ PSKT
6.0000 mg | PREFILLED_SYRINGE | Freq: Once | SUBCUTANEOUS | Status: AC
  Administered 2015-04-25: 6 mg via SUBCUTANEOUS
  Filled 2015-04-25: qty 0.6

## 2015-04-25 MED ORDER — SODIUM CHLORIDE 0.9 % IV SOLN
470.0000 mg | Freq: Once | INTRAVENOUS | Status: AC
  Administered 2015-04-25: 470 mg via INTRAVENOUS
  Filled 2015-04-25: qty 47

## 2015-04-25 MED ORDER — SODIUM CHLORIDE 0.9 % IV SOLN
Freq: Once | INTRAVENOUS | Status: AC
  Administered 2015-04-25: 10:00:00 via INTRAVENOUS
  Filled 2015-04-25: qty 5

## 2015-04-25 MED ORDER — DOCETAXEL CHEMO INJECTION 160 MG/16ML
70.0000 mg/m2 | Freq: Once | INTRAVENOUS | Status: AC
  Administered 2015-04-25: 120 mg via INTRAVENOUS
  Filled 2015-04-25: qty 12

## 2015-04-25 MED ORDER — PALONOSETRON HCL INJECTION 0.25 MG/5ML
0.2500 mg | Freq: Once | INTRAVENOUS | Status: AC
  Administered 2015-04-25: 0.25 mg via INTRAVENOUS
  Filled 2015-04-25: qty 5

## 2015-04-25 MED ORDER — SODIUM CHLORIDE 0.9 % IV SOLN
6.0000 mg/kg | Freq: Once | INTRAVENOUS | Status: AC
  Administered 2015-04-25: 399 mg via INTRAVENOUS
  Filled 2015-04-25: qty 19

## 2015-04-25 MED ORDER — ACETAMINOPHEN 325 MG PO TABS
650.0000 mg | ORAL_TABLET | Freq: Once | ORAL | Status: AC
  Administered 2015-04-25: 650 mg via ORAL
  Filled 2015-04-25: qty 2

## 2015-04-25 NOTE — Progress Notes (Signed)
Patient does have living will.  Former smoker. 

## 2015-04-25 NOTE — Progress Notes (Signed)
[place] @ [facility] [phone]  [phone]) [place] Pogorz[place]ki OB: 05/21/1941  MR#: [id number]  LFY#:[id number]  Patient Care Team: [name], [name], MD (General Surgery) Self Requested  CHIEF COMPLAINT:  Chief Complaint  Patient presents with  . OTHER   Oncology History   1.  Abnormal mammogram of the left breast.  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  Progesterone receptor negative.  HER-2/neu receptor positive 2.  Patient is starting [facility] from July OF 2016      VISIT DIAGNOSIS:   No diagnosis found.     Oncology Flowsheet 01/08/2015 01/16/2015 01/31/2015 02/21/2015 03/14/2015 04/04/2015  Day, Cycle - - Day 1, Cycle 1 Day 1, Cycle 2 Day 1, Cycle 3 Day 1, Cycle 4  CARBOplatin (PARAPLATIN) IV - - 470 mg 470 mg 470 mg 470 mg  dexamethasone (DECADRON) IJ - - - - - -  dexamethasone (DECADRON) IV - - [ 12 mg ] [ 12 mg ] [ 12 mg ] [ 12 mg ]  diphenhydrAMINE (BENADRYL) PO - - 50 mg 50 mg - -  DOCEtaxel (TAXOTERE) IV - - 70 mg/m2 70 mg/m2 70 mg/m2 70 mg/m2  fosaprepitant (EMEND) IV - - [ 150 mg ] [ 150 mg ] [ 150 mg ] [ 150 mg ]  ondansetron (ZOFRAN) IV - - - - - -  palonosetron (ALOXI) IV - - 0.25 mg 0.25 mg 0.25 mg 0.25 mg  pegfilgrastim (NEULASTA ONPRO KIT) [place] - - 6 mg 6 mg 6 mg 6 mg  trastuzumab (HERCEPTIN) IV - - 8 mg/kg 6 mg/kg 6 mg/kg 6 mg/kg    INTERVAL HISTORY:  74 year old lady who had a regular mammogram recent mammogram with abnormal ultrasound was done.  There were 3 nodules were found.  Patient underwent stereotactic ultrasound-guided biopsy.  3 nodules were 4 cm 5 cm in 6 cm away from nipple.  And the one nodule at 5 cm revealed focal changes consistent with high-grade ductal   Carcinoma in  situ  Patient has been evaluated by surgeon and here for further evaluation and treatment consideration.  Slightly apprehensive but not any acute distress April 25, 2015 Patient is here for ongoing evaluation and initiate fifth cycle of chemotherapy Fatigue is a problem.  Has some numbness in the left upper extremity related to axillary node dissection No swelling of left upper extremity.  No soreness in the mouth.  Patient also has hearing problem but it is chronic. No nausea no vomiting no diarrhea patient is also planning to get hip surgery done  GENERAL:   Feeling somewhat weak and tired . fatigue grade 2 PERFORMANCE STATUS (ECOG): 0  HEENT:  No visual changes, runny nose, sore throat, mouth sores or tenderness. Lungs: Cough and chest tightness. Cardiac:  No chest pain, palpitations, orthopnea, or PND. GI:  No nausea, vomiting, diarrhea, constipation, melena or hematochezia. GU:  No urgency, frequency, dysuria, or hematuria. Prior use of birth control pills several years ago.  Did not use any estrogen or any other hormone replacement therapy Musculoskeletal: Right hip pain .    Skin:  No rashes or skin changes. Neuro:  No headache, numbness or weakness, balance or coordination issues. Endocrine:  No diabetes, thyroid issues, hot flashes or night sweats.  Psych:  No mood changes, depression or anxiety. Pain:  Severe right hip pain and pain in the low back area radiating down to right lower extremity Review of systems:  All other systems reviewed and found to be negative. As per HPI. Otherwise, a complete review of systems is negatve.  PAST MEDICAL HISTORY: Past Medical History  Diagnosis Date  . 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 Hearing loss   . Memory impairment     seen by Dr Manuella Ghazi  possible post crainiotomy from radiation  . Breast cancer of upper-inner quadrant of left female breast 12/15/14    Left breast invasive mammary carcinoma, T1c (1.5 cm) ER negative, [name],  [name].    PAST SURGICAL HISTORY: Past Surgical History  Procedure Laterality Date  . Appendectomy  1950  . Brain surgery  1995    left frontal/temporal  . Colonoscopy  2010    Dr. [name]  . Breast biopsy Left 1997  . 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 Breast biopsy Left 01/08/2015    Procedure: BREAST BIOPSY WITH NEEDLE LOCALIZATION;  Surgeon: [name], MD;  Location: [facility] ORS;  Service: General;  Laterality: Left;  . Breast lumpectomy Left 01/08/2015    Procedure: LUMPECTOMY;  Surgeon: [name], MD;  Location: [facility] ORS;  Service: General;  Laterality: Left;  . Sentinel node biopsy Left 01/16/2015    Procedure: SENTINEL NODE BIOPSY;  Surgeon: [name], MD;  Location: [facility] ORS;  Service: General;  Laterality: Left;  . Portacath placement Right 01/16/2015    Procedure: INSERTION PORT-A-CATH;  Surgeon: [name], MD;  Location: [facility] ORS;  Service: General;  Laterality: Right;    FAMILY HISTORY Family History  Problem Relation Age of Onset  . Breast cancer Sister 20  . Addison's disease Mother   . Stroke Father            HEALTH MAINTENANCE: Social History  Substance Use Topics  . Smoking status: Former Smoker    Quit date: 12/28/1964  . Smokeless tobacco: Never Used  . Alcohol Use: 0.6 - 1.2 oz/week    1-2 Glasses of wine per week     Comment: 1 Glass Wine / Night      No Known Allergies  Current Outpatient Prescriptions  Medication Sig Dispense Refill  . Ascorbic Acid (VITAMIN C) 100 MG tablet Take 100 mg by mouth every morning.     . Calcium Carbonate-Vit D-Min (CALTRATE 600+D PLUS MINERALS) 600-800 MG-UNIT TABS Take 1 tablet by mouth every morning.    . calcium-vitamin D 250-100 MG-UNIT per tablet Take 1 tablet by mouth 1 day or 1 dose. 600 mg    . dexamethasone (DECADRON) 4 MG tablet Take 2 tablets (8 mg total) by mouth 2 (two) times daily. Start the day before Taxotere. Then again the day after chemo. 30  tablet 1  . dexamethasone (DECADRON) 4 MG tablet TAKE TWO TABLETS TWICE A DAY. START THE DAY BEFORE TAXOTERE, THEN AGAIN THE DAY AFTER CHEMO FOR 3 DAYS 20 tablet 0  . gabapentin (NEURONTIN) 300 MG capsule Limit 2-4 tabs by mouth per day if tolerated 120 capsule 0  . HYDROcodone-acetaminophen (NORCO/VICODIN) 5-325 MG per tablet Limit one tablet by mouth per day or twice per day if tolerated 60 tablet 0  . lidocaine-prilocaine (EMLA) cream Apply 1 application topically as needed. 30 g 3  . LORazepam (ATIVAN) 0.5 MG tablet Take 1 tablet (0.5 mg  total) by mouth every 6 (six) hours as needed (Nausea or vomiting). 30 tablet 0  . meloxicam (MOBIC) 7.5 MG tablet Limit one tablet by mouth per day if tolerated 30 tablet 0  . ondansetron (ZOFRAN) 8 MG tablet Take 1 tablet (8 mg total) by mouth 2 (two) times daily. Start the day after chemo for 3 days. Then take as needed for nausea or vomiting. 30 tablet 1  . traMADol (ULTRAM) 50 MG tablet Limit 1 tab by mouth to 2 - 4 times per day if tolerated 120 tablet 0  . Triprolidine-Pseudoephedrine (ANTIHISTAMINE PO) Take by mouth every morning.     Marland Kitchen azithromycin (ZITHROMAX) 500 MG tablet Take 1 tablet (500 mg total) by mouth daily. (Patient not taking: Reported on 04/25/2015) 5 tablet 0  . gabapentin (NEURONTIN) 300 MG capsule TAKE ONE CAPSULE AT BEDTIME (Patient not taking: Reported on 04/25/2015) 30 capsule 1   No current facility-administered medications for this visit.    OBJECTIVE: GENERAL:  Well developed, well nourished, sitting comfortably in the exam room in no acute distress. Fatigue  grade 1 MENTAL STATUS:  Alert and oriented to person, place and time.  ENT: No evidence of stomatitisI.  Alopecia  RESPIRATORY:  Clear to auscultation without rales, wheezes or rhonchi. CARDIOVASCULAR:  Regular rate and rhythm without murmur, rub or gallop. Port site within normal limit ABDOMEN:  Soft, non-tender, with active bowel sounds, and no hepatosplenomegaly.  No  masses. BACK:  No CVA tenderness.  No tenderness on percussion of the back or rib cage. SKIN:  No rashes, ulcers or lesions. EXTREMITIES: No edema, no skin discoloration or tenderness.  No palpable cords. LYMPH NODES: No palpable cervical, supraclavicular, axillary or inguinal adenopathy  NEUROLOGICAL: Unremarkable. PSYCH:  Appropriate.  Filed Vitals:   04/25/15 0852  BP: 133/74  Pulse: 85  Temp: 96.5 F (35.8 C)     Body mass index is 24.41 kg/(m^2).    ECOG FS:1 - Symptomatic but completely ambulatory  LAB RESULTS:  Infusion on 04/25/2015  Component Date Value Ref Range Status  . WBC 04/25/2015 9.5  3.6 - 11.0 K/uL Final   A-LINE DRAW  . RBC 04/25/2015 3.72* 3.80 - 5.20 MIL/uL Final  . Hemoglobin 04/25/2015 11.3* 12.0 - 16.0 g/dL Final  . HCT 04/25/2015 34.0* 35.0 - 47.0 % Final  . MCV 04/25/2015 91.5  80.0 - 100.0 fL Final  . MCH 04/25/2015 30.5  26.0 - 34.0 pg Final  . MCHC 04/25/2015 33.3  32.0 - 36.0 g/dL Final  . RDW 04/25/2015 18.5* 11.5 - 14.5 % Final  . Platelets 04/25/2015 338  150 - 440 K/uL Final  . Neutrophils Relative % 04/25/2015 91   Final  . Neutro Abs 04/25/2015 8.6* 1.4 - 6.5 K/uL Final  . Lymphocytes Relative 04/25/2015 8   Final  . Lymphs Abs 04/25/2015 0.7* 1.0 - 3.6 K/uL Final  . Monocytes Relative 04/25/2015 1   Final  . Monocytes Absolute 04/25/2015 0.1* 0.2 - 0.9 K/uL Final  . Eosinophils Relative 04/25/2015 0   Final  . Eosinophils Absolute 04/25/2015 0.0  0 - 0.7 K/uL Final  . Basophils Relative 04/25/2015 0   Final  . Basophils Absolute 04/25/2015 0.0  0 - 0.1 K/uL Final     STUDIES: Dg Chest 2 View  04/11/2015   CLINICAL DATA:  Productive cough, history of breast carcinoma  EXAM: CHEST  2 VIEW  COMPARISON:  Chest x-ray of 01/16/2015  FINDINGS: Minimally prominent linear markings are noted at the  lung bases left-greater-than-right, most consistent with atelectasis, not being present previously. No pleural effusion is seen. A right-sided  Port-A-Cath is present with the tip seen to the expected SVC -RA junction. No pneumothorax is seen. Heart size is normal.  IMPRESSION: 1. Right-sided Port-A-Cath tip overlies the expected SVC -RA junction. 2. Bibasilar linear atelectasis left-greater-than-right.   Electronically Signed   By: Ivar Drape M.D.   On: 04/11/2015 10:39    ASSESSMENT:  12 -year-old lady with a history of carcinoma breast.   T1c N1 C micro-M0  Patient is starting fifth cycle of chemotherapy Hearing problem which is chronic and being followed by ENT surgeon might be related to the release radiation for meningioma We will also consider possibility of hip surgery between last chemotherapy 4 weeks after last chemotherapy and before radiation treatment begins. MUGA scan of the heart after the next chemotherapy cycle.  (Prior to beginning of maintenance therapy) Estrogen and progesterone receptor negative HER-2 receptor positive   Staging form: Breast, AJCC 7th Edition    [name], MD   04/25/2015 9:12 AM

## 2015-05-02 ENCOUNTER — Inpatient Hospital Stay: Payer: Medicare Other | Attending: Oncology

## 2015-05-02 DIAGNOSIS — Z79899 Other long term (current) drug therapy: Secondary | ICD-10-CM | POA: Insufficient documentation

## 2015-05-02 DIAGNOSIS — Z5111 Encounter for antineoplastic chemotherapy: Secondary | ICD-10-CM | POA: Insufficient documentation

## 2015-05-02 DIAGNOSIS — C773 Secondary and unspecified malignant neoplasm of axilla and upper limb lymph nodes: Secondary | ICD-10-CM | POA: Insufficient documentation

## 2015-05-02 DIAGNOSIS — Z87891 Personal history of nicotine dependence: Secondary | ICD-10-CM | POA: Diagnosis not present

## 2015-05-02 DIAGNOSIS — Z171 Estrogen receptor negative status [ER-]: Secondary | ICD-10-CM | POA: Insufficient documentation

## 2015-05-02 DIAGNOSIS — D0512 Intraductal carcinoma in situ of left breast: Secondary | ICD-10-CM | POA: Insufficient documentation

## 2015-05-02 DIAGNOSIS — C50912 Malignant neoplasm of unspecified site of left female breast: Secondary | ICD-10-CM

## 2015-05-02 LAB — CBC WITH DIFFERENTIAL/PLATELET
BASOS ABS: 0.1 10*3/uL (ref 0–0.1)
Basophils Relative: 0 %
Eosinophils Absolute: 0 10*3/uL (ref 0–0.7)
Eosinophils Relative: 0 %
HEMATOCRIT: 35.9 % (ref 35.0–47.0)
HEMOGLOBIN: 11.7 g/dL — AB (ref 12.0–16.0)
LYMPHS PCT: 9 %
Lymphs Abs: 2.4 10*3/uL (ref 1.0–3.6)
MCH: 30.2 pg (ref 26.0–34.0)
MCHC: 32.6 g/dL (ref 32.0–36.0)
MCV: 92.6 fL (ref 80.0–100.0)
MONO ABS: 1.9 10*3/uL — AB (ref 0.2–0.9)
Monocytes Relative: 7 %
NEUTROS ABS: 23.3 10*3/uL — AB (ref 1.4–6.5)
Neutrophils Relative %: 84 %
Platelets: 411 10*3/uL (ref 150–440)
RBC: 3.87 MIL/uL (ref 3.80–5.20)
RDW: 18.8 % — AB (ref 11.5–14.5)
WBC: 27.7 10*3/uL — ABNORMAL HIGH (ref 3.6–11.0)

## 2015-05-02 NOTE — Telephone Encounter (Signed)
error 

## 2015-05-07 ENCOUNTER — Other Ambulatory Visit: Payer: Self-pay | Admitting: Pain Medicine

## 2015-05-09 ENCOUNTER — Inpatient Hospital Stay: Payer: Medicare Other

## 2015-05-09 DIAGNOSIS — Z5111 Encounter for antineoplastic chemotherapy: Secondary | ICD-10-CM | POA: Diagnosis not present

## 2015-05-09 DIAGNOSIS — C773 Secondary and unspecified malignant neoplasm of axilla and upper limb lymph nodes: Principal | ICD-10-CM

## 2015-05-09 DIAGNOSIS — C50912 Malignant neoplasm of unspecified site of left female breast: Secondary | ICD-10-CM

## 2015-05-09 LAB — CBC WITH DIFFERENTIAL/PLATELET
BASOS PCT: 1 %
Basophils Absolute: 0.1 10*3/uL (ref 0–0.1)
Eosinophils Absolute: 0 10*3/uL (ref 0–0.7)
Eosinophils Relative: 0 %
HEMATOCRIT: 34.7 % — AB (ref 35.0–47.0)
Hemoglobin: 11.2 g/dL — ABNORMAL LOW (ref 12.0–16.0)
LYMPHS ABS: 1.1 10*3/uL (ref 1.0–3.6)
Lymphocytes Relative: 10 %
MCH: 30.4 pg (ref 26.0–34.0)
MCHC: 32.3 g/dL (ref 32.0–36.0)
MCV: 94.1 fL (ref 80.0–100.0)
MONO ABS: 0.7 10*3/uL (ref 0.2–0.9)
MONOS PCT: 6 %
NEUTROS ABS: 9.4 10*3/uL — AB (ref 1.4–6.5)
Neutrophils Relative %: 83 %
Platelets: 211 10*3/uL (ref 150–440)
RBC: 3.69 MIL/uL — ABNORMAL LOW (ref 3.80–5.20)
RDW: 20 % — AB (ref 11.5–14.5)
WBC: 11.2 10*3/uL — ABNORMAL HIGH (ref 3.6–11.0)

## 2015-05-09 LAB — COMPREHENSIVE METABOLIC PANEL
ALBUMIN: 3.9 g/dL (ref 3.5–5.0)
ALK PHOS: 98 U/L (ref 38–126)
ALT: 45 U/L (ref 14–54)
ANION GAP: 6 (ref 5–15)
AST: 35 U/L (ref 15–41)
BILIRUBIN TOTAL: 0.4 mg/dL (ref 0.3–1.2)
BUN: 14 mg/dL (ref 6–20)
CALCIUM: 8.3 mg/dL — AB (ref 8.9–10.3)
CO2: 30 mmol/L (ref 22–32)
Chloride: 99 mmol/L — ABNORMAL LOW (ref 101–111)
Creatinine, Ser: 0.66 mg/dL (ref 0.44–1.00)
Glucose, Bld: 94 mg/dL (ref 65–99)
POTASSIUM: 3.8 mmol/L (ref 3.5–5.1)
Sodium: 135 mmol/L (ref 135–145)
TOTAL PROTEIN: 6.5 g/dL (ref 6.5–8.1)

## 2015-05-11 ENCOUNTER — Other Ambulatory Visit: Payer: Self-pay | Admitting: *Deleted

## 2015-05-11 DIAGNOSIS — C773 Secondary and unspecified malignant neoplasm of axilla and upper limb lymph nodes: Principal | ICD-10-CM

## 2015-05-11 DIAGNOSIS — C50912 Malignant neoplasm of unspecified site of left female breast: Secondary | ICD-10-CM

## 2015-05-12 ENCOUNTER — Other Ambulatory Visit: Payer: Self-pay | Admitting: Hematology and Oncology

## 2015-05-12 DIAGNOSIS — C50912 Malignant neoplasm of unspecified site of left female breast: Secondary | ICD-10-CM

## 2015-05-12 DIAGNOSIS — C773 Secondary and unspecified malignant neoplasm of axilla and upper limb lymph nodes: Principal | ICD-10-CM

## 2015-05-12 MED ORDER — HYDROCODONE-ACETAMINOPHEN 5-325 MG PO TABS: ORAL_TABLET | ORAL | Status: DC

## 2015-05-16 ENCOUNTER — Encounter: Payer: Self-pay | Admitting: Oncology

## 2015-05-16 ENCOUNTER — Inpatient Hospital Stay: Payer: Medicare Other

## 2015-05-16 ENCOUNTER — Ambulatory Visit
Admission: RE | Admit: 2015-05-16 | Discharge: 2015-05-16 | Disposition: A | Discharge status: Home / Self Care | Payer: Medicare Other | Source: Ambulatory Visit | Attending: Radiation Oncology | Admitting: Radiation Oncology

## 2015-05-16 ENCOUNTER — Inpatient Hospital Stay (HOSPITAL_BASED_OUTPATIENT_CLINIC_OR_DEPARTMENT_OTHER): Payer: Medicare Other | Admitting: Oncology

## 2015-05-16 ENCOUNTER — Encounter: Payer: Self-pay | Admitting: Radiation Oncology

## 2015-05-16 VITALS — BP 129/59 | HR 84 | Temp 97.8°F | Ht 64.0 in | Wt 146.0 lb

## 2015-05-16 DIAGNOSIS — D0512 Intraductal carcinoma in situ of left breast: Secondary | ICD-10-CM

## 2015-05-16 DIAGNOSIS — C50912 Malignant neoplasm of unspecified site of left female breast: Secondary | ICD-10-CM

## 2015-05-16 DIAGNOSIS — M25559 Pain in unspecified hip: Secondary | ICD-10-CM | POA: Insufficient documentation

## 2015-05-16 DIAGNOSIS — Z510 Encounter for antineoplastic radiation therapy: Secondary | ICD-10-CM | POA: Insufficient documentation

## 2015-05-16 DIAGNOSIS — I972 Postmastectomy lymphedema syndrome: Secondary | ICD-10-CM | POA: Insufficient documentation

## 2015-05-16 DIAGNOSIS — Z171 Estrogen receptor negative status [ER-]: Secondary | ICD-10-CM | POA: Diagnosis not present

## 2015-05-16 DIAGNOSIS — C773 Secondary and unspecified malignant neoplasm of axilla and upper limb lymph nodes: Secondary | ICD-10-CM | POA: Diagnosis not present

## 2015-05-16 DIAGNOSIS — Z5111 Encounter for antineoplastic chemotherapy: Secondary | ICD-10-CM | POA: Diagnosis not present

## 2015-05-16 DIAGNOSIS — Z79899 Other long term (current) drug therapy: Secondary | ICD-10-CM | POA: Diagnosis not present

## 2015-05-16 DIAGNOSIS — C50219 Malignant neoplasm of upper-inner quadrant of unspecified female breast: Secondary | ICD-10-CM | POA: Insufficient documentation

## 2015-05-16 DIAGNOSIS — Z87891 Personal history of nicotine dependence: Secondary | ICD-10-CM

## 2015-05-16 LAB — COMPREHENSIVE METABOLIC PANEL
ALBUMIN: 4 g/dL (ref 3.5–5.0)
ALT: 35 U/L (ref 14–54)
ANION GAP: 8 (ref 5–15)
AST: 46 U/L — ABNORMAL HIGH (ref 15–41)
Alkaline Phosphatase: 64 U/L (ref 38–126)
BUN: 17 mg/dL (ref 6–20)
CHLORIDE: 102 mmol/L (ref 101–111)
CO2: 25 mmol/L (ref 22–32)
Calcium: 8.5 mg/dL — ABNORMAL LOW (ref 8.9–10.3)
Creatinine, Ser: 0.73 mg/dL (ref 0.44–1.00)
GFR calc Af Amer: >60 mL/min (ref >60)
GFR calc non Af Amer: >60 mL/min (ref >60)
GLUCOSE: 175 mg/dL — AB (ref 65–99)
POTASSIUM: 3.5 mmol/L (ref 3.5–5.1)
Sodium: 135 mmol/L (ref 135–145)
TOTAL PROTEIN: 6.6 g/dL (ref 6.5–8.1)
Total Bilirubin: 0.6 mg/dL (ref 0.3–1.2)

## 2015-05-16 LAB — CBC WITH DIFFERENTIAL/PLATELET
BASOS PCT: 0 %
Basophils Absolute: 0 10*3/uL (ref 0–0.1)
Eosinophils Absolute: 0 10*3/uL (ref 0–0.7)
Eosinophils Relative: 0 %
HEMATOCRIT: 31.4 % — AB (ref 35.0–47.0)
Hemoglobin: 10.6 g/dL — ABNORMAL LOW (ref 12.0–16.0)
Lymphocytes Relative: 6 %
Lymphs Abs: 0.5 10*3/uL — ABNORMAL LOW (ref 1.0–3.6)
MCH: 31.5 pg (ref 26.0–34.0)
MCHC: 33.7 g/dL (ref 32.0–36.0)
MCV: 93.5 fL (ref 80.0–100.0)
MONO ABS: 0.1 10*3/uL — AB (ref 0.2–0.9)
MONOS PCT: 2 %
NEUTROS ABS: 8.9 10*3/uL — AB (ref 1.4–6.5)
Neutrophils Relative %: 92 %
Platelets: 192 10*3/uL (ref 150–440)
RBC: 3.36 MIL/uL — ABNORMAL LOW (ref 3.80–5.20)
RDW: 19.3 % — AB (ref 11.5–14.5)
WBC: 9.6 10*3/uL (ref 4.0–10.5)

## 2015-05-16 MED ORDER — PEGFILGRASTIM 6 MG/0.6ML ~~LOC~~ PSKT
6.0000 mg | PREFILLED_SYRINGE | Freq: Once | SUBCUTANEOUS | Status: AC
  Administered 2015-05-16: 6 mg via SUBCUTANEOUS
  Filled 2015-05-16: qty 0.6

## 2015-05-16 MED ORDER — HEPARIN SOD (PORK) LOCK FLUSH 100 UNIT/ML IV SOLN
INTRAVENOUS | Status: AC
  Filled 2015-05-16: qty 5

## 2015-05-16 MED ORDER — SODIUM CHLORIDE 0.9 % IV SOLN
Freq: Once | INTRAVENOUS | Status: AC
  Administered 2015-05-16: 12:00:00 via INTRAVENOUS
  Filled 2015-05-16: qty 1000

## 2015-05-16 MED ORDER — HYDROCODONE-ACETAMINOPHEN 5-325 MG PO TABS: ORAL_TABLET | ORAL | Status: DC

## 2015-05-16 MED ORDER — PALONOSETRON HCL INJECTION 0.25 MG/5ML
0.2500 mg | Freq: Once | INTRAVENOUS | Status: AC
  Administered 2015-05-16: 0.25 mg via INTRAVENOUS
  Filled 2015-05-16: qty 5

## 2015-05-16 MED ORDER — TRASTUZUMAB CHEMO INJECTION 440 MG
6.0000 mg/kg | Freq: Once | INTRAVENOUS | Status: AC
  Administered 2015-05-16: 399 mg via INTRAVENOUS
  Filled 2015-05-16: qty 19

## 2015-05-16 MED ORDER — SODIUM CHLORIDE 0.9 % IJ SOLN
10.0000 mL | INTRAMUSCULAR | Status: DC | PRN
  Administered 2015-05-16: 10 mL
  Filled 2015-05-16: qty 10

## 2015-05-16 MED ORDER — FOSAPREPITANT DIMEGLUMINE INJECTION 150 MG
Freq: Once | INTRAVENOUS | Status: AC
  Administered 2015-05-16: 12:00:00 via INTRAVENOUS
  Filled 2015-05-16: qty 5

## 2015-05-16 MED ORDER — SODIUM CHLORIDE 0.9 % IV SOLN
470.0000 mg | Freq: Once | INTRAVENOUS | Status: AC
  Administered 2015-05-16: 470 mg via INTRAVENOUS
  Filled 2015-05-16: qty 47

## 2015-05-16 MED ORDER — HEPARIN SOD (PORK) LOCK FLUSH 100 UNIT/ML IV SOLN
500.0000 [IU] | Freq: Once | INTRAVENOUS | Status: AC | PRN
  Administered 2015-05-16: 500 [IU]

## 2015-05-16 MED ORDER — DOCETAXEL CHEMO INJECTION 160 MG/16ML
70.0000 mg/m2 | Freq: Once | INTRAVENOUS | Status: AC
  Administered 2015-05-16: 120 mg via INTRAVENOUS
  Filled 2015-05-16: qty 12

## 2015-05-16 NOTE — Consult Note (Signed)
Except an outstanding is perfect of Radiation Oncology NEW PATIENT EVALUATION  Name: [name]  MRN: [id number]  Date:   05/16/2015     DOB: 07-11-1941   This 74 y.o. female patient presents to the clinic for initial evaluation of invasive mammary carcinoma the left breast stage I (T1c and 1MIC M0) ER/[name] HER-2/neu overexpressed status post [facility] and continuation on Herceptin wide local excision and sentinel node biopsy now for adjuvant whole breast and peripheral lymphatic radiation.  REFERRING PHYSICIAN: [name], MD  CHIEF COMPLAINT: No chief complaint on file.   DIAGNOSIS: The encounter diagnosis was Breast cancer metastasized to axillary lymph node, left ([place]).   PREVIOUS INVESTIGATIONS:  Pathology reports reviewed Mammograms reviewed Clinical notes reviewed Case presented at an our weekly breast cancer conference  HPI: Patient is a 74 year old female well-known to department having been a consultation approximate 20 years prior I believe a pituitary adenoma. She received radiation therapy at [facility]. Recently presented with an abnormal mammogram of the left breast showing suspicious findings in the medial portion of the left breast. There were 2 discrete masses large 1 probable fibroadenoma and assist. Third abnormality was a more ill-defined focal hypoechoic area with shadowing. Biopsy was positive for invasive mammary carcinoma. She underwent a wide local excision of the left breast after needle localization showing invasive mammary carcinoma and high-grade ductal carcinoma in situ. 3 sentinel lymph nodes were examined 1 had a 0.8 mm micrometastatic focus present. Her wide local excision showed 1.5 cm overall grade 3 invasive mammary carcinoma with margins clear but close at less than 1 mm. Tumor was ER/[name] HER-2/neu overexpressed. Lymph vascular invasion was present. She went on to have [facility] chemotherapy and has just completed her sixth cycle. She  has done fairly well. She is now referred to radiation oncology for consideration and opinion regarding adjuvant radiation therapy to her left breast. Patient's been having significant problems with pain in her right hip and is scheduled for right hip surgery in the near future after completion of radiation.  PLANNED TREATMENT REGIMEN left Whole breast and peripheral lymphatic radiation  PAST MEDICAL HISTORY:  has a past medical history of Brain tumor ([place]) (1995); Hearing loss; Memory impairment; and Breast cancer of upper-inner quadrant of left female breast ([place]) (12/15/14).    PAST SURGICAL HISTORY:  Past Surgical History  Procedure Laterality Date  . Appendectomy  1950  . Brain surgery  1995    left frontal/temporal  . Colonoscopy  2010    Dr. [name]  . Breast biopsy Left 1997  . 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 Breast biopsy Left 01/08/2015    Procedure: BREAST BIOPSY WITH NEEDLE LOCALIZATION;  Surgeon: [name], MD;  Location: [facility] ORS;  Service: General;  Laterality: Left;  . Breast lumpectomy Left 01/08/2015    Procedure: LUMPECTOMY;  Surgeon: [name], MD;  Location: [facility] ORS;  Service: General;  Laterality: Left;  . Sentinel node biopsy Left 01/16/2015    Procedure: SENTINEL NODE BIOPSY;  Surgeon: [name], MD;  Location: [facility] ORS;  Service: General;  Laterality: Left;  . Portacath placement Right 01/16/2015    Procedure: INSERTION PORT-A-CATH;  Surgeon: [name], MD;  Location: [facility] ORS;  Service: General;  Laterality: Right;    FAMILY HISTORY: family history includes Addison's disease in her mother; Breast cancer (age of onset: 68) in her sister; Stroke in her father.  SOCIAL HISTORY:  reports  that she quit smoking about 50 years ago. She has never used smokeless tobacco. She reports that she drinks about 0.6 - 1.2 oz of alcohol per week. She reports that she does not use illicit drugs.  ALLERGIES: Review of patient's  allergies indicates no known allergies.  MEDICATIONS:  Current Outpatient Prescriptions  Medication Sig Dispense Refill  . Ascorbic Acid (VITAMIN C) 100 MG tablet Take 100 mg by mouth every morning.     Marland Kitchen azithromycin (ZITHROMAX) 500 MG tablet Take 1 tablet (500 mg total) by mouth daily. (Patient not taking: Reported on 04/25/2015) 5 tablet 0  . Calcium Carbonate-Vit D-Min (CALTRATE 600+D PLUS MINERALS) 600-800 MG-UNIT TABS Take 1 tablet by mouth every morning.    . calcium-vitamin D 250-100 MG-UNIT per tablet Take 1 tablet by mouth 1 day or 1 dose. 600 mg    . dexamethasone (DECADRON) 4 MG tablet Take 2 tablets (8 mg total) by mouth 2 (two) times daily. Start the day before Taxotere. Then again the day after chemo. 30 tablet 1  . dexamethasone (DECADRON) 4 MG tablet TAKE TWO TABLETS TWICE A DAY. START THE DAY BEFORE TAXOTERE, THEN AGAIN THE DAY AFTER CHEMO FOR 3 DAYS 20 tablet 0  . gabapentin (NEURONTIN) 300 MG capsule Limit 2-4 tabs by mouth per day if tolerated 120 capsule 0  . gabapentin (NEURONTIN) 300 MG capsule TAKE ONE CAPSULE AT BEDTIME (Patient not taking: Reported on 04/25/2015) 30 capsule 1  . HYDROcodone-acetaminophen (NORCO/VICODIN) 5-325 MG tablet Limit one tablet by mouth per day or twice per day if tolerated 30 tablet 0  . lidocaine-prilocaine (EMLA) cream Apply 1 application topically as needed. 30 g 3  . LORazepam (ATIVAN) 0.5 MG tablet Take 1 tablet (0.5 mg total) by mouth every 6 (six) hours as needed (Nausea or vomiting). 30 tablet 0  . meloxicam (MOBIC) 7.5 MG tablet Limit one tablet by mouth per day if tolerated 30 tablet 0  . ondansetron (ZOFRAN) 8 MG tablet Take 1 tablet (8 mg total) by mouth 2 (two) times daily. Start the day after chemo for 3 days. Then take as needed for nausea or vomiting. 30 tablet 1  . traMADol (ULTRAM) 50 MG tablet Limit 1 tab by mouth to 2 - 4 times per day if tolerated 120 tablet 0  . Triprolidine-Pseudoephedrine (ANTIHISTAMINE PO) Take by mouth  every morning.      No current facility-administered medications for this encounter.   Facility-Administered Medications Ordered in Other Encounters  Medication Dose Route Frequency Provider Last Rate Last Dose  . CARBOplatin (PARAPLATIN) 470 mg in sodium chloride 0.9 % 250 mL chemo infusion  470 mg Intravenous Once [name], MD      . DOCEtaxel (TAXOTERE) 120 mg in dextrose 5 % 250 mL chemo infusion  70 mg/m2 (Treatment Plan Actual) Intravenous Once [name], MD      . fosaprepitant (EMEND) 150 mg, dexamethasone (DECADRON) 12 mg in sodium chloride 0.9 % 145 mL IVPB   Intravenous Once [name], MD      . palonosetron (ALOXI) injection 0.25 mg  0.25 mg Intravenous Once [name], MD      . pegfilgrastim (NEULASTA ONPRO KIT) injection 6 mg  6 mg Subcutaneous Once [name], MD      . trastuzumab (HERCEPTIN) 399 mg in sodium chloride 0.9 % 250 mL chemo infusion  6 mg/kg (Treatment Plan Actual) Intravenous Once [name], MD        ECOG PERFORMANCE STATUS:  0 - Asymptomatic  REVIEW OF SYSTEMS: Patient has pain in her right hip as described above. Also has some slight cognitive impairment most likely secondary to radiation therapy at [facility] 20 years prior otherwise Patient denies any weight loss, fatigue, weakness, fever, chills or night sweats. Patient denies any loss of vision, blurred vision. Patient denies any ringing  of the ears or hearing loss. No irregular heartbeat. Patient denies heart murmur or history of fainting. Patient denies any chest pain or pain radiating to her upper extremities. Patient denies any shortness of breath, difficulty breathing at night, cough or hemoptysis. Patient denies any swelling in the lower legs. Patient denies any nausea vomiting, vomiting of blood, or coffee ground material in the vomitus. Patient denies any stomach pain. Patient states has had normal bowel movements no significant constipation or diarrhea. Patient denies any dysuria,  hematuria or significant nocturia. Patient denies any problems walking, swelling in the joints or loss of balance. Patient denies any skin changes, loss of hair or loss of weight. Patient denies any excessive worrying or anxiety or significant depression. Patient denies any problems with insomnia. Patient denies excessive thirst, polyuria, polydipsia. Patient denies any swollen glands, patient denies easy bruising or easy bleeding. Patient denies any recent infections, allergies or URI. Patient "s visual fields have not changed significantly in recent time.    PHYSICAL EXAM: There were no vitals taken for this visit. Well-developed female in NAD she has a Port-A-Cath placed in right anterior chest wall. She has alopecia of the scalp. Left breast is wide local excision and sentinel node scar both healed well no dominant mass or nodularity is noted in either breast in 2 positions examined. No axillary or supraclavicular adenopathy is appreciated. Well-developed well-nourished patient in NAD. HEENT reveals PERLA, EOMI, discs not visualized.  Oral cavity is clear. No oral mucosal lesions are identified. Neck is clear without evidence of cervical or supraclavicular adenopathy. Lungs are clear to A&P. Cardiac examination is essentially unremarkable with regular rate and rhythm without murmur rub or thrill. Abdomen is benign with no organomegaly or masses noted. Motor sensory and DTR levels are equal and symmetric in the upper and lower extremities. Cranial nerves II through XII are grossly intact. Proprioception is intact. No peripheral adenopathy or edema is identified. No motor or sensory levels are noted. Crude visual fields are within normal range.   LABORATORY DATA: Pathology reports reviewed    RADIOLOGY RESULTS: Mammograms and ultrasound reviewed   IMPRESSION: 74 year old female with invasive mammary carcinoma the left breast as was wide local excision and sentinel node biopsy with micrometastatic  disease in one sentinel lymph node completing systemic chemotherapy to be on Herceptin for whole breast and peripheral lymphatic radiation  PLAN: At this time I to go ahead with left breast and peripheral lymphatic radiation. Would treat both areas to 5000 cGy over 5 weeks. I would boost her scar another 1600 cGy based on the close margin using electron beam. Based on the limited lymph node dissection of her left axilla and micrometastatic disease in one of 3 nodes believe adjuvant radiation therapy to her peripheral lymphatics is indicated. I discussed the case personally with medical oncology. I have set up and ordered CT simulation early next week. Risks and benefits of treatment including skin reaction, fatigue, alteration of blood counts, inclusion of superficial lung and slight slight possibility of lymphedema in her left upper extremity were all discussed in detail. I've also encouraged her to use her left upper extremity is much is possible from  physical therapy standpoint.  I would like to take this opportunity for allowing me to participate in the care of your patient.Armstead Peaks., MD

## 2015-05-17 ENCOUNTER — Encounter: Payer: Self-pay | Admitting: Oncology

## 2015-05-17 NOTE — Progress Notes (Signed)
[place] @ [facility] Telephone:(336) [phone]  Fax:(336) [place] OB: 1941/03/23  MR#: [id number]  QHU#:[id number]  Patient Care Team: [name], [name], MD (General Surgery) Self Requested  CHIEF COMPLAINT:  Chief Complaint  Patient presents with  . Breast Cancer    scheduling of hip surgery and radiation   Oncology History   1.  Abnormal mammogram of the left breast.  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  Progesterone receptor negative.  HER-2/neu receptor positive 2.  Patient is starting [facility] from July OF 2016   3.  Patient received lost [facility] chemotherapy on May 16, 2015 4.  He will be evaluated by radiation oncologist and will continue Herceptin maintenance therapy   VISIT DIAGNOSIS:     ICD-9-CM ICD-10-CM   1. Breast cancer metastasized to axillary lymph node, left (HCC) 174.9 C50.912 HYDROcodone-acetaminophen (NORCO/VICODIN) 5-325 MG tablet   196.3 C77.3 NM Cardiac [place]     Comprehensive metabolic panel       Oncology Flowsheet 01/16/2015 01/31/2015 02/21/2015 03/14/2015 04/04/2015 04/25/2015 05/16/2015  Day, Cycle - Day 1, Cycle 1 Day 1, Cycle 2 Day 1, Cycle 3 Day 1, Cycle 4 Day 1, Cycle 5 Day 1, Cycle 6  CARBOplatin (PARAPLATIN) IV - 470 mg 470 mg 470 mg 470 mg 470 mg 470 mg  dexamethasone (DECADRON) IJ - - - - - - -  dexamethasone (DECADRON) IV - [ 12 mg ] [ 12 mg ] [ 12 mg ] [ 12 mg ] [ 12 mg ] [ 12 mg ]  diphenhydrAMINE (BENADRYL) PO - 50 mg 50 mg - - - -  DOCEtaxel (TAXOTERE) IV - 70 mg/m2 70 mg/m2 70 mg/m2 70 mg/m2 70 mg/m2 70 mg/m2  fosaprepitant (EMEND) IV - [ 150 mg ] [ 150 mg ] [ 150 mg ] [ 150 mg ] [ 150 mg ] [ 150 mg ]  ondansetron (ZOFRAN) IV - - - - - - -  palonosetron (ALOXI)  IV - 0.25 mg 0.25 mg 0.25 mg 0.25 mg 0.25 mg 0.25 mg  pegfilgrastim (NEULASTA ONPRO KIT) [place] - 6 mg 6 mg 6 mg 6 mg 6 mg 6 mg  trastuzumab (HERCEPTIN) IV - 8 mg/kg 6 mg/kg 6 mg/kg 6 mg/kg 6 mg/kg 6 mg/kg    INTERVAL HISTORY:  74 year old lady who had a regular mammogram recent mammogram with abnormal ultrasound was done.  There were 3 nodules were found.  Patient underwent stereotactic ultrasound-guided biopsy.  3 nodules were 4 cm 5 cm in 6 cm away from nipple.  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  Slightly apprehensive but not any acute distress Patient is for the last chemotherapy cycle with [place].  Tolerated treatment very well.  No nausea.  No vomiting.  No diarrhea. Patient continues to problems with the right hip pain L Lopez shear.  No evidence of stomatitis.  No tingling.  No numbness.  No diarrhea. GENERAL:   Feeling somewhat weak and tired . fatigue grade 2 PERFORMANCE STATUS (ECOG): 0  HEENT:  No visual changes, runny nose, sore throat, mouth sores or tenderness.  Lungs: Cough and chest tightness. Cardiac:  No chest pain, palpitations, orthopnea, or PND. GI:  No nausea, vomiting, diarrhea, constipation, melena or hematochezia. GU:  No urgency, frequency, dysuria, or hematuria. Prior use of birth control pills several years ago.  Did not use any estrogen or any other hormone replacement therapy Musculoskeletal: Right hip pain .    Skin:  No rashes or skin changes. Neuro:  No headache, numbness or weakness, balance or coordination issues. Endocrine:  No diabetes, thyroid issues, hot flashes or night sweats. Psych:  No mood changes, depression or anxiety. Pain:  Severe right hip pain and pain in the low back area radiating down to right lower extremity Review of systems:  All other systems reviewed and found to be  negative. As per HPI. Otherwise, a complete review of systems is negatve.  PAST MEDICAL HISTORY: Past Medical History  Diagnosis Date  . 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 Hearing loss   . Memory impairment     seen by Dr Manuella Ghazi  possible post crainiotomy from radiation  . Breast cancer of upper-inner quadrant of left female breast ([place]) 12/15/14    Left breast invasive mammary carcinoma, T1c (1.5 cm) ER negative, [name], [name].    PAST SURGICAL HISTORY: Past Surgical History  Procedure Laterality Date  . Appendectomy  1950  . Brain surgery  1995    left frontal/temporal  . Colonoscopy  2010    Dr. [name]  . Breast biopsy Left 1997  . 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 Breast biopsy Left 01/08/2015    Procedure: BREAST BIOPSY WITH NEEDLE LOCALIZATION;  Surgeon: [name], MD;  Location: [facility] ORS;  Service: General;  Laterality: Left;  . Breast lumpectomy Left 01/08/2015    Procedure: LUMPECTOMY;  Surgeon: [name], MD;  Location: [facility] ORS;  Service: General;  Laterality: Left;  . Sentinel node biopsy Left 01/16/2015    Procedure: SENTINEL NODE BIOPSY;  Surgeon: [name], MD;  Location: [facility] ORS;  Service: General;  Laterality: Left;  . Portacath placement Right 01/16/2015    Procedure: INSERTION PORT-A-CATH;  Surgeon: [name], MD;  Location: [facility] ORS;  Service: General;  Laterality: Right;    FAMILY HISTORY Family History  Problem Relation Age of Onset  . Breast cancer Sister 32  . Addison's disease Mother   . Stroke Father            HEALTH MAINTENANCE: Social History  Substance Use Topics  . Smoking status: Former Smoker    Quit date: 12/28/1964  . Smokeless tobacco: Never Used  . Alcohol Use: 0.6 - 1.2 oz/week    1-2 Glasses of wine per week     Comment: 1 Glass Wine / Night      No Known Allergies  Current Outpatient Prescriptions  Medication Sig Dispense Refill  . Ascorbic Acid (VITAMIN  C) 100 MG tablet Take 100 mg by mouth every morning.     Marland Kitchen azithromycin (ZITHROMAX) 500 MG tablet Take 1 tablet (500 mg total) by mouth daily. (Patient not taking: Reported on 04/25/2015) 5 tablet 0  . Calcium Carbonate-Vit D-Min (CALTRATE 600+D PLUS MINERALS) 600-800 MG-UNIT TABS Take 1 tablet by mouth every morning.    . calcium-vitamin D 250-100 MG-UNIT per tablet Take 1 tablet by mouth 1 day or 1 dose. 600 mg    . dexamethasone (DECADRON) 4 MG tablet Take 2 tablets (8 mg total) by mouth  2 (two) times daily. Start the day before Taxotere. Then again the day after chemo. 30 tablet 1  . dexamethasone (DECADRON) 4 MG tablet TAKE TWO TABLETS TWICE A DAY. START THE DAY BEFORE TAXOTERE, THEN AGAIN THE DAY AFTER CHEMO FOR 3 DAYS 20 tablet 0  . gabapentin (NEURONTIN) 300 MG capsule Limit 2-4 tabs by mouth per day if tolerated 120 capsule 0  . gabapentin (NEURONTIN) 300 MG capsule TAKE ONE CAPSULE AT BEDTIME (Patient not taking: Reported on 04/25/2015) 30 capsule 1  . HYDROcodone-acetaminophen (NORCO/VICODIN) 5-325 MG tablet Limit one tablet by mouth per day or twice per day if tolerated 30 tablet 0  . lidocaine-prilocaine (EMLA) cream Apply 1 application topically as needed. 30 g 3  . LORazepam (ATIVAN) 0.5 MG tablet Take 1 tablet (0.5 mg total) by mouth every 6 (six) hours as needed (Nausea or vomiting). 30 tablet 0  . meloxicam (MOBIC) 7.5 MG tablet Limit one tablet by mouth per day if tolerated 30 tablet 0  . ondansetron (ZOFRAN) 8 MG tablet Take 1 tablet (8 mg total) by mouth 2 (two) times daily. Start the day after chemo for 3 days. Then take as needed for nausea or vomiting. 30 tablet 1  . traMADol (ULTRAM) 50 MG tablet Limit 1 tab by mouth to 2 - 4 times per day if tolerated 120 tablet 0  . Triprolidine-Pseudoephedrine (ANTIHISTAMINE PO) Take by mouth every morning.      No current facility-administered medications for this visit.   Facility-Administered Medications Ordered in Other Visits    Medication Dose Route Frequency Provider Last Rate Last Dose  . sodium chloride 0.9 % injection 10 mL  10 mL Intracatheter PRN [name], MD   10 mL at 05/16/15 0905    OBJECTIVE: GENERAL:  Well developed, well nourished, sitting comfortably in the exam room in no acute distress. Fatigue  grade 1 MENTAL STATUS:  Alert and oriented to person, place and time.  ENT: No evidence of stomatitisI.  Alopecia  RESPIRATORY:  Clear to auscultation without rales, wheezes or rhonchi. CARDIOVASCULAR:  Regular rate and rhythm without murmur, rub or gallop. Port site within normal limit ABDOMEN:  Soft, non-tender, with active bowel sounds, and no hepatosplenomegaly.  No masses. BACK:  No CVA tenderness.  No tenderness on percussion of the back or rib cage. SKIN:  No rashes, ulcers or lesions. EXTREMITIES: No edema, no skin discoloration or tenderness.  No palpable cords. LYMPH NODES: No palpable cervical, supraclavicular, axillary or inguinal adenopathy  NEUROLOGICAL: Unremarkable. PSYCH:  Appropriate.  Filed Vitals:   05/16/15 0937  BP: 129/59  Pulse: 84  Temp: 97.8 F (36.6 C)     Body mass index is 25.05 kg/(m^2).    ECOG FS:1 - Symptomatic but completely ambulatory  LAB RESULTS:  Infusion on 05/16/2015  Component Date Value Ref Range Status  . WBC 05/16/2015 9.6  4.0 - 10.5 K/uL Final   A-LINE DRAW  . RBC 05/16/2015 3.36* 3.80 - 5.20 MIL/uL Final  . Hemoglobin 05/16/2015 10.6* 12.0 - 16.0 g/dL Final  . HCT 05/16/2015 31.4* 35.0 - 47.0 % Final  . MCV 05/16/2015 93.5  80.0 - 100.0 fL Final  . MCH 05/16/2015 31.5  26.0 - 34.0 pg Final  . MCHC 05/16/2015 33.7  32.0 - 36.0 g/dL Final  . RDW 05/16/2015 19.3* 11.5 - 14.5 % Final  . Platelets 05/16/2015 192  150 - 440 K/uL Final  . Neutrophils Relative % 05/16/2015 92   Final  . Neutro Abs  05/16/2015 8.9* 1.4 - 6.5 K/uL Final  . Lymphocytes Relative 05/16/2015 6   Final  . Lymphs Abs 05/16/2015 0.5* 1.0 - 3.6 K/uL Final  . Monocytes  Relative 05/16/2015 2   Final  . Monocytes Absolute 05/16/2015 0.1* 0.2 - 0.9 K/uL Final  . Eosinophils Relative 05/16/2015 0   Final  . Eosinophils Absolute 05/16/2015 0.0  0 - 0.7 K/uL Final  . Basophils Relative 05/16/2015 0   Final  . Basophils Absolute 05/16/2015 0.0  0 - 0.1 K/uL Final  . Sodium 05/16/2015 135  135 - 145 mmol/L Final  . Potassium 05/16/2015 3.5  3.5 - 5.1 mmol/L Final  . Chloride 05/16/2015 102  101 - 111 mmol/L Final  . CO2 05/16/2015 25  22 - 32 mmol/L Final  . Glucose, Bld 05/16/2015 175* 65 - 99 mg/dL Final  . BUN 05/16/2015 17  6 - 20 mg/dL Final  . Creatinine, Ser 05/16/2015 0.73  0.44 - 1.00 mg/dL Final  . Calcium 05/16/2015 8.5* 8.9 - 10.3 mg/dL Final  . Total Protein 05/16/2015 6.6  6.5 - 8.1 g/dL Final  . Albumin 05/16/2015 4.0  3.5 - 5.0 g/dL Final  . AST 05/16/2015 46* 15 - 41 U/L Final  . ALT 05/16/2015 35  14 - 54 U/L Final  . Alkaline Phosphatase 05/16/2015 64  38 - 126 U/L Final  . Total Bilirubin 05/16/2015 0.6  0.3 - 1.2 mg/dL Final  . GFR calc non Af Amer 05/16/2015 >60  >60 mL/min Final  . GFR calc Af Amer 05/16/2015 >60  >60 mL/min Final   Comment: (NOTE) The eGFR has been calculated using the CKD EPI equation. This calculation has not been validated in all clinical situations. eGFR's persistently <60 mL/min signify possible Chronic Kidney Disease.   . Anion gap 05/16/2015 8  5 - 15 Final     ASSESSMENT:  89 -year-old lady with a history of carcinoma breast.   T1c N1 C micro-M0  Initiate last cycle of chemotherapy Discussed situation with radiation oncologist Dr. Spero Geralds examined this patient today and consider radiation therapy Will continue Herceptin maintenance therapy Proceed with MUGA scan of the heart for reevaluation I will also discuss with orthopedic surgeon about possible hip replacement because of persistent severe pain    [name], MD   05/17/2015 8:21 AM

## 2015-05-22 ENCOUNTER — Ambulatory Visit
Admission: RE | Admit: 2015-05-22 | Discharge: 2015-05-22 | Disposition: A | Discharge status: Home / Self Care | Payer: Medicare Other | Source: Ambulatory Visit | Attending: Radiation Oncology | Admitting: Radiation Oncology

## 2015-05-22 DIAGNOSIS — I972 Postmastectomy lymphedema syndrome: Secondary | ICD-10-CM | POA: Diagnosis not present

## 2015-05-22 DIAGNOSIS — Z171 Estrogen receptor negative status [ER-]: Secondary | ICD-10-CM | POA: Diagnosis not present

## 2015-05-22 DIAGNOSIS — M25559 Pain in unspecified hip: Secondary | ICD-10-CM | POA: Diagnosis not present

## 2015-05-22 DIAGNOSIS — Z510 Encounter for antineoplastic radiation therapy: Secondary | ICD-10-CM | POA: Diagnosis not present

## 2015-05-22 DIAGNOSIS — C50219 Malignant neoplasm of upper-inner quadrant of unspecified female breast: Secondary | ICD-10-CM | POA: Diagnosis not present

## 2015-05-22 DIAGNOSIS — C773 Secondary and unspecified malignant neoplasm of axilla and upper limb lymph nodes: Secondary | ICD-10-CM | POA: Diagnosis not present

## 2015-05-23 ENCOUNTER — Inpatient Hospital Stay: Payer: Medicare Other

## 2015-05-23 DIAGNOSIS — C773 Secondary and unspecified malignant neoplasm of axilla and upper limb lymph nodes: Principal | ICD-10-CM

## 2015-05-23 DIAGNOSIS — C50912 Malignant neoplasm of unspecified site of left female breast: Secondary | ICD-10-CM

## 2015-05-23 DIAGNOSIS — Z5111 Encounter for antineoplastic chemotherapy: Secondary | ICD-10-CM | POA: Diagnosis not present

## 2015-05-23 LAB — CBC WITH DIFFERENTIAL/PLATELET
BASOS ABS: 0 10*3/uL (ref 0–0.1)
Basophils Relative: 0 %
EOS PCT: 0 %
Eosinophils Absolute: 0 10*3/uL (ref 0–0.7)
HEMATOCRIT: 33.9 % — AB (ref 35.0–47.0)
Hemoglobin: 11.1 g/dL — ABNORMAL LOW (ref 12.0–16.0)
LYMPHS PCT: 9 %
Lymphs Abs: 1.1 10*3/uL (ref 1.0–3.6)
MCH: 31.2 pg (ref 26.0–34.0)
MCHC: 32.8 g/dL (ref 32.0–36.0)
MCV: 95.2 fL (ref 80.0–100.0)
MONO ABS: 1 10*3/uL — AB (ref 0.2–0.9)
MONOS PCT: 8 %
Neutro Abs: 9.5 10*3/uL — ABNORMAL HIGH (ref 1.4–6.5)
Neutrophils Relative %: 83 %
PLATELETS: 150 10*3/uL (ref 150–440)
RBC: 3.56 MIL/uL — ABNORMAL LOW (ref 3.80–5.20)
RDW: 19.4 % — AB (ref 11.5–14.5)
WBC: 11.6 10*3/uL — ABNORMAL HIGH (ref 3.6–11.0)

## 2015-05-24 ENCOUNTER — Other Ambulatory Visit: Payer: Self-pay | Admitting: *Deleted

## 2015-05-24 DIAGNOSIS — C50912 Malignant neoplasm of unspecified site of left female breast: Secondary | ICD-10-CM

## 2015-05-24 DIAGNOSIS — C773 Secondary and unspecified malignant neoplasm of axilla and upper limb lymph nodes: Principal | ICD-10-CM

## 2015-05-29 ENCOUNTER — Ambulatory Visit: Payer: Medicare Other

## 2015-05-30 ENCOUNTER — Inpatient Hospital Stay: Payer: Medicare Other | Attending: Oncology

## 2015-05-30 ENCOUNTER — Ambulatory Visit
Admission: RE | Admit: 2015-05-30 | Discharge: 2015-05-30 | Disposition: A | Discharge status: Home / Self Care | Payer: Medicare Other | Source: Ambulatory Visit | Attending: Oncology | Admitting: Oncology

## 2015-05-30 ENCOUNTER — Ambulatory Visit: Payer: Medicare Other

## 2015-05-30 DIAGNOSIS — Z5112 Encounter for antineoplastic immunotherapy: Secondary | ICD-10-CM | POA: Diagnosis not present

## 2015-05-30 DIAGNOSIS — I5189 Other ill-defined heart diseases: Secondary | ICD-10-CM | POA: Insufficient documentation

## 2015-05-30 DIAGNOSIS — Z87891 Personal history of nicotine dependence: Secondary | ICD-10-CM | POA: Diagnosis not present

## 2015-05-30 DIAGNOSIS — Z79899 Other long term (current) drug therapy: Secondary | ICD-10-CM | POA: Insufficient documentation

## 2015-05-30 DIAGNOSIS — Z9221 Personal history of antineoplastic chemotherapy: Secondary | ICD-10-CM | POA: Diagnosis present

## 2015-05-30 DIAGNOSIS — Z510 Encounter for antineoplastic radiation therapy: Secondary | ICD-10-CM | POA: Diagnosis not present

## 2015-05-30 DIAGNOSIS — Z171 Estrogen receptor negative status [ER-]: Secondary | ICD-10-CM | POA: Insufficient documentation

## 2015-05-30 DIAGNOSIS — C773 Secondary and unspecified malignant neoplasm of axilla and upper limb lymph nodes: Secondary | ICD-10-CM | POA: Insufficient documentation

## 2015-05-30 DIAGNOSIS — C50912 Malignant neoplasm of unspecified site of left female breast: Secondary | ICD-10-CM | POA: Diagnosis not present

## 2015-05-30 DIAGNOSIS — Z761 Encounter for health supervision and care of foundling: Secondary | ICD-10-CM | POA: Diagnosis not present

## 2015-05-30 LAB — CBC WITH DIFFERENTIAL/PLATELET
BASOS ABS: 0 10*3/uL (ref 0–0.1)
BASOS PCT: 0 %
EOS ABS: 0 10*3/uL (ref 0–0.7)
Eosinophils Relative: 0 %
HEMATOCRIT: 34.4 % — AB (ref 35.0–47.0)
HEMOGLOBIN: 11.2 g/dL — AB (ref 12.0–16.0)
Lymphocytes Relative: 8 %
Lymphs Abs: 0.6 10*3/uL — ABNORMAL LOW (ref 1.0–3.6)
MCH: 31.6 pg (ref 26.0–34.0)
MCHC: 32.6 g/dL (ref 32.0–36.0)
MCV: 97.1 fL (ref 80.0–100.0)
MONOS PCT: 5 %
Monocytes Absolute: 0.3 10*3/uL (ref 0.2–0.9)
NEUTROS ABS: 5.9 10*3/uL (ref 1.4–6.5)
NEUTROS PCT: 87 %
Platelets: 231 10*3/uL (ref 150–440)
RBC: 3.55 MIL/uL — AB (ref 3.80–5.20)
RDW: 19.1 % — ABNORMAL HIGH (ref 11.5–14.5)
WBC: 6.9 10*3/uL (ref 3.6–11.0)

## 2015-05-30 MED ORDER — TECHNETIUM TC 99M-LABELED RED BLOOD CELLS IV KIT
21.6370 | PACK | Freq: Once | INTRAVENOUS | Status: AC | PRN
  Administered 2015-05-30: 21.637 via INTRAVENOUS

## 2015-05-31 ENCOUNTER — Ambulatory Visit
Admission: RE | Admit: 2015-05-31 | Discharge: 2015-05-31 | Disposition: A | Discharge status: Home / Self Care | Payer: Medicare Other | Source: Ambulatory Visit | Attending: Radiation Oncology | Admitting: Radiation Oncology

## 2015-05-31 ENCOUNTER — Ambulatory Visit: Payer: Medicare Other

## 2015-05-31 DIAGNOSIS — Z510 Encounter for antineoplastic radiation therapy: Secondary | ICD-10-CM | POA: Diagnosis not present

## 2015-06-01 ENCOUNTER — Ambulatory Visit: Payer: Medicare Other

## 2015-06-04 ENCOUNTER — Ambulatory Visit
Admission: RE | Admit: 2015-06-04 | Discharge: 2015-06-04 | Disposition: A | Discharge status: Home / Self Care | Payer: Medicare Other | Source: Ambulatory Visit | Attending: Radiation Oncology | Admitting: Radiation Oncology

## 2015-06-04 ENCOUNTER — Ambulatory Visit: Payer: Medicare Other

## 2015-06-04 DIAGNOSIS — Z510 Encounter for antineoplastic radiation therapy: Secondary | ICD-10-CM | POA: Diagnosis not present

## 2015-06-05 ENCOUNTER — Ambulatory Visit: Payer: Medicare Other

## 2015-06-05 ENCOUNTER — Ambulatory Visit
Admission: RE | Admit: 2015-06-05 | Discharge: 2015-06-05 | Disposition: A | Discharge status: Home / Self Care | Payer: Medicare Other | Source: Ambulatory Visit | Attending: Radiation Oncology | Admitting: Radiation Oncology

## 2015-06-05 DIAGNOSIS — Z510 Encounter for antineoplastic radiation therapy: Secondary | ICD-10-CM | POA: Diagnosis not present

## 2015-06-06 ENCOUNTER — Inpatient Hospital Stay (HOSPITAL_BASED_OUTPATIENT_CLINIC_OR_DEPARTMENT_OTHER): Payer: Medicare Other | Admitting: Oncology

## 2015-06-06 ENCOUNTER — Inpatient Hospital Stay: Payer: Medicare Other

## 2015-06-06 ENCOUNTER — Encounter: Payer: Self-pay | Admitting: Oncology

## 2015-06-06 ENCOUNTER — Ambulatory Visit
Admission: RE | Admit: 2015-06-06 | Discharge: 2015-06-06 | Disposition: A | Discharge status: Home / Self Care | Payer: Medicare Other | Source: Ambulatory Visit | Attending: Radiation Oncology | Admitting: Radiation Oncology

## 2015-06-06 ENCOUNTER — Ambulatory Visit: Payer: Medicare Other

## 2015-06-06 VITALS — BP 114/72 | HR 86 | Temp 97.4°F | Wt 147.3 lb

## 2015-06-06 DIAGNOSIS — C50912 Malignant neoplasm of unspecified site of left female breast: Secondary | ICD-10-CM | POA: Diagnosis not present

## 2015-06-06 DIAGNOSIS — C773 Secondary and unspecified malignant neoplasm of axilla and upper limb lymph nodes: Principal | ICD-10-CM

## 2015-06-06 DIAGNOSIS — Z79899 Other long term (current) drug therapy: Secondary | ICD-10-CM

## 2015-06-06 DIAGNOSIS — Z171 Estrogen receptor negative status [ER-]: Secondary | ICD-10-CM | POA: Diagnosis not present

## 2015-06-06 DIAGNOSIS — Z5112 Encounter for antineoplastic immunotherapy: Secondary | ICD-10-CM | POA: Diagnosis not present

## 2015-06-06 DIAGNOSIS — Z87891 Personal history of nicotine dependence: Secondary | ICD-10-CM | POA: Diagnosis not present

## 2015-06-06 DIAGNOSIS — Z510 Encounter for antineoplastic radiation therapy: Secondary | ICD-10-CM | POA: Diagnosis not present

## 2015-06-06 LAB — CBC WITH DIFFERENTIAL/PLATELET
BASOS ABS: 0 10*3/uL (ref 0–0.1)
Basophils Relative: 0 %
EOS PCT: 2 %
Eosinophils Absolute: 0.1 10*3/uL (ref 0–0.7)
HEMATOCRIT: 31.3 % — AB (ref 35.0–47.0)
Hemoglobin: 10.5 g/dL — ABNORMAL LOW (ref 12.0–16.0)
LYMPHS ABS: 1.1 10*3/uL (ref 1.0–3.6)
LYMPHS PCT: 28 %
MCH: 32.5 pg (ref 26.0–34.0)
MCHC: 33.4 g/dL (ref 32.0–36.0)
MCV: 97.2 fL (ref 80.0–100.0)
MONO ABS: 0.5 10*3/uL (ref 0.2–0.9)
MONOS PCT: 11 %
Neutro Abs: 2.4 10*3/uL (ref 1.4–6.5)
Neutrophils Relative %: 59 %
Platelets: 285 10*3/uL (ref 150–440)
RBC: 3.22 MIL/uL — ABNORMAL LOW (ref 3.80–5.20)
RDW: 18.1 % — AB (ref 11.5–14.5)
WBC: 4 10*3/uL (ref 3.6–11.0)

## 2015-06-06 LAB — COMPREHENSIVE METABOLIC PANEL
ALK PHOS: 56 U/L (ref 38–126)
ALT: 25 U/L (ref 14–54)
AST: 25 U/L (ref 15–41)
Albumin: 3.8 g/dL (ref 3.5–5.0)
Anion gap: 5 (ref 5–15)
BILIRUBIN TOTAL: 0.4 mg/dL (ref 0.3–1.2)
BUN: 18 mg/dL (ref 6–20)
CALCIUM: 8.7 mg/dL — AB (ref 8.9–10.3)
CO2: 29 mmol/L (ref 22–32)
Chloride: 100 mmol/L — ABNORMAL LOW (ref 101–111)
Creatinine, Ser: 0.67 mg/dL (ref 0.44–1.00)
GFR calc Af Amer: >60 mL/min (ref >60)
Glucose, Bld: 120 mg/dL — ABNORMAL HIGH (ref 65–99)
POTASSIUM: 3.9 mmol/L (ref 3.5–5.1)
Sodium: 134 mmol/L — ABNORMAL LOW (ref 135–145)
TOTAL PROTEIN: 5.9 g/dL — AB (ref 6.5–8.1)

## 2015-06-06 MED ORDER — SODIUM CHLORIDE 0.9 % IV SOLN
6.0000 mg/kg | Freq: Once | INTRAVENOUS | Status: AC
  Administered 2015-06-06: 399 mg via INTRAVENOUS
  Filled 2015-06-06: qty 19

## 2015-06-06 MED ORDER — ACETAMINOPHEN 325 MG PO TABS
650.0000 mg | ORAL_TABLET | Freq: Once | ORAL | Status: AC
  Administered 2015-06-06: 650 mg via ORAL
  Filled 2015-06-06: qty 2

## 2015-06-06 MED ORDER — SODIUM CHLORIDE 0.9 % IJ SOLN
10.0000 mL | INTRAMUSCULAR | Status: DC | PRN
  Administered 2015-06-06: 10 mL via INTRAVENOUS
  Filled 2015-06-06: qty 10

## 2015-06-06 MED ORDER — HEPARIN SOD (PORK) LOCK FLUSH 100 UNIT/ML IV SOLN
500.0000 [IU] | Freq: Once | INTRAVENOUS | Status: AC
  Administered 2015-06-06: 500 [IU] via INTRAVENOUS
  Filled 2015-06-06: qty 5

## 2015-06-06 MED ORDER — SODIUM CHLORIDE 0.9 % IV SOLN
Freq: Once | INTRAVENOUS | Status: AC
  Administered 2015-06-06: 15:00:00 via INTRAVENOUS
  Filled 2015-06-06: qty 1000

## 2015-06-06 NOTE — Progress Notes (Signed)
[place] @ [facility] Telephone:(336) [phone]  Fax:(336) [place] OB: 01/16/1941  MR#: [id number]  CXK#:[id number]  Patient Care Team: [name], [name], MD (General Surgery) Self Requested  CHIEF COMPLAINT:  Chief Complaint  Patient presents with  . OTHER   Oncology History   1.  Abnormal mammogram of the left breast.  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  Progesterone receptor negative.  HER-2/neu receptor positive 2.  Patient is starting [facility] from July OF 2016   3.  Patient received lost [facility] chemotherapy on May 16, 2015 4.  She  will be evaluated by radiation oncologist and will continue Herceptin maintenance therapy 5.  Radiation therapy has been started in October of 2016   VISIT DIAGNOSIS:   Carcinoma of left breast    Oncology Flowsheet 01/16/2015 01/31/2015 02/21/2015 03/14/2015 04/04/2015 04/25/2015 05/16/2015  Day, Cycle - Day 1, Cycle 1 Day 1, Cycle 2 Day 1, Cycle 3 Day 1, Cycle 4 Day 1, Cycle 5 Day 1, Cycle 6  CARBOplatin (PARAPLATIN) IV - 470 _0  mg  dexamethasone (DECADRON) IJ - - - - - - -  dexamethasone (DECADRON) IV - [ 12 mg ] [ 12 mg ] [ 12 mg ] [ 12 mg ] [ 12 mg ] [ 12 mg ]  diphenhydrAMINE (BENADRYL) PO - 50 mg 50 mg - - - -  DOCEtaxel (TAXOTERE) IV - 70 mg/m2 70 mg/m2 70 mg/m2 70 mg/m2 70 mg/m2 70 mg/m2  fosaprepitant (EMEND) IV - [ 150 mg ] [ 150 mg ] [ 150 mg ] [ 150 mg ] [ 150 mg ] [ 150 mg ]  ondansetron (ZOFRAN) IV - - - - - - -  palonosetron (ALOXI) IV - 0.25 mg 0.25 mg 0.25 mg 0.25 mg 0.25 mg 0.25 mg  pegfilgrastim (NEULASTA ONPRO KIT) [place] - 6 _1  mg  trastuzumab (HERCEPTIN) IV - 8 mg/kg 6 mg/kg 6 mg/kg 6 mg/kg 6 mg/kg 6  mg/kg    INTERVAL HISTORY:  74 year old lady who had a regular mammogram recent mammogram with abnormal ultrasound was done.  There were 3 nodules were found.  Patient underwent stereotactic ultrasound-guided biopsy.  3 nodules were 4 cm 5 cm in 6 cm away from nipple.  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  Patient is here to initiate maintenance Herceptin therapy Started radiation therapy Plan is to get hip surgery done sometime in middle lobe, January Patient had a repeat MUGA scan which is more than 50% at present time  Review of systems Alopecia. Somewhat weak and tired but no chills and fever No shortness of breath no swelling of lower extremity.  No palpitation.  No chest pain. Review of follow this systems have been normal PAST MEDICAL HISTORY: Past Medical History  Diagnosis Date  . 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 Hearing loss   . Memory impairment     seen by Dr Manuella Ghazi  possible post crainiotomy from radiation  . Breast cancer of upper-inner quadrant of left female breast ([place]) 12/15/14    Left  breast invasive mammary carcinoma, T1c (1.5 cm) ER negative, [name], [name].    PAST SURGICAL HISTORY: Past Surgical History  Procedure Laterality Date  . Appendectomy  1950  . Brain surgery  1995    left frontal/temporal  . Colonoscopy  2010    Dr. [name]  . Breast biopsy Left 1997  . 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 Breast biopsy Left 01/08/2015    Procedure: BREAST BIOPSY WITH NEEDLE LOCALIZATION;  Surgeon: [name], MD;  Location: [facility] ORS;  Service: General;  Laterality: Left;  . Breast lumpectomy Left 01/08/2015    Procedure: LUMPECTOMY;  Surgeon: [name], MD;  Location: [facility] ORS;  Service: General;  Laterality: Left;  . Sentinel node biopsy Left 01/16/2015    Procedure: SENTINEL NODE BIOPSY;  Surgeon: [name], MD;  Location: [facility] ORS;   Service: General;  Laterality: Left;  . Portacath placement Right 01/16/2015    Procedure: INSERTION PORT-A-CATH;  Surgeon: [name], MD;  Location: [facility] ORS;  Service: General;  Laterality: Right;    FAMILY HISTORY Family History  Problem Relation Age of Onset  . Breast cancer Sister 49  . Addison's disease Mother   . Stroke Father            HEALTH MAINTENANCE: Social History  Substance Use Topics  . Smoking status: Former Smoker    Quit date: 12/28/1964  . Smokeless tobacco: Never Used  . Alcohol Use: 0.6 - 1.2 oz/week    1-2 Glasses of wine per week     Comment: 1 Glass Wine / Night      No Known Allergies  Current Outpatient Prescriptions  Medication Sig Dispense Refill  . Ascorbic Acid (VITAMIN C) 100 MG tablet Take 100 mg by mouth every morning.     Marland Kitchen azithromycin (ZITHROMAX) 500 MG tablet Take 1 tablet (500 mg total) by mouth daily. 5 tablet 0  . Calcium Carbonate-Vit D-Min (CALTRATE 600+D PLUS MINERALS) 600-800 MG-UNIT TABS Take 1 tablet by mouth every morning.    . calcium-vitamin D 250-100 MG-UNIT per tablet Take 1 tablet by mouth 1 day or 1 dose. 600 mg    . dexamethasone (DECADRON) 4 MG tablet Take 2 tablets (8 mg total) by mouth 2 (two) times daily. Start the day before Taxotere. Then again the day after chemo. 30 tablet 1  . gabapentin (NEURONTIN) 300 MG capsule Limit 2-4 tabs by mouth per day if tolerated 120 capsule 0  . gabapentin (NEURONTIN) 300 MG capsule TAKE ONE CAPSULE AT BEDTIME 30 capsule 1  . HYDROcodone-acetaminophen (NORCO/VICODIN) 5-325 MG tablet Limit one tablet by mouth per day or twice per day if tolerated 30 tablet 0  . lidocaine-prilocaine (EMLA) cream Apply 1 application topically as needed. 30 g 3  . meloxicam (MOBIC) 7.5 MG tablet Limit one tablet by mouth per day if tolerated 30 tablet 0  . traMADol (ULTRAM) 50 MG tablet Limit 1 tab by mouth to 2 - 4 times per day if tolerated 120 tablet 0  . Triprolidine-Pseudoephedrine  (ANTIHISTAMINE PO) Take by mouth every morning.      No current facility-administered medications for this visit.    OBJECTIVE: GENERAL:  Well developed, well nourished, sitting comfortably in the exam room in no acute distress. Fatigue  grade 1 MENTAL STATUS:  Alert and oriented to person, place and time.  ENT: No evidence of stomatitisI.  Alopecia  RESPIRATORY:  Clear to auscultation without rales, wheezes or  rhonchi. CARDIOVASCULAR:  Regular rate and rhythm without murmur, rub or gallop. Port site within normal limit ABDOMEN:  Soft, non-tender, with active bowel sounds, and no hepatosplenomegaly.  No masses. BACK:  No CVA tenderness.  No tenderness on percussion of the back or rib cage. SKIN:  No rashes, ulcers or lesions. EXTREMITIES: No edema, no skin discoloration or tenderness.  No palpable cords. LYMPH NODES: No palpable cervical, supraclavicular, axillary or inguinal adenopathy  NEUROLOGICAL: Unremarkable. PSYCH:  Appropriate.  Filed Vitals:   06/06/15 1347  BP: 114/72  Pulse: 86  Temp: 97.4 F (36.3 C)     Body mass index is 25.27 kg/(m^2).    ECOG FS:1 - Symptomatic but completely ambulatory  LAB RESULTS:  No visits with results within 2 Day(s) from this visit. Latest known visit with results is:  Appointment on 05/30/2015  Component Date Value Ref Range Status  . WBC 05/30/2015 6.9  3.6 - 11.0 K/uL Final  . RBC 05/30/2015 3.55* 3.80 - 5.20 MIL/uL Final  . Hemoglobin 05/30/2015 11.2* 12.0 - 16.0 g/dL Final  . HCT 05/30/2015 34.4* 35.0 - 47.0 % Final  . MCV 05/30/2015 97.1  80.0 - 100.0 fL Final  . MCH 05/30/2015 31.6  26.0 - 34.0 pg Final  . MCHC 05/30/2015 32.6  32.0 - 36.0 g/dL Final  . RDW 05/30/2015 19.1* 11.5 - 14.5 % Final  . Platelets 05/30/2015 231  150 - 440 K/uL Final  . Neutrophils Relative % 05/30/2015 87   Final  . Neutro Abs 05/30/2015 5.9  1.4 - 6.5 K/uL Final  . Lymphocytes Relative 05/30/2015 8   Final  . Lymphs Abs 05/30/2015 0.6* 1.0 - 3.6  K/uL Final  . Monocytes Relative 05/30/2015 5   Final  . Monocytes Absolute 05/30/2015 0.3  0.2 - 0.9 K/uL Final  . Eosinophils Relative 05/30/2015 0   Final  . Eosinophils Absolute 05/30/2015 0.0  0 - 0.7 K/uL Final  . Basophils Relative 05/30/2015 0   Final  . Basophils Absolute 05/30/2015 0.0  0 - 0.1 K/uL Final     ASSESSMENT:  74 -year-old lady with a history of carcinoma breast.   T1c N1 C micro-M0  Patient had a repeat MUGA scan of the heart which has been reviewed independently As expected there is some drop in ejection fraction but ejection fraction is still about 52% and patient remains asymptomatic I had prolonged discussion with patient and her husband that we would like to continue a few more cycles of Herceptin and repeat MUGA scan of the heart.  If there is a progressive decline in ejection fraction and Herceptin can be discontinued for a while and it MUGA scan of the heart can be checked again  Patient also has started radiation therapy to the left breast Patient is also planning to get hip surgery done in January when she can be given a break from the Herceptin therapy Patient and family is agreeable to it will continue Herceptin 6 mg/kg every 3 weeks with close monitoring of the heart function   [name], MD   06/06/2015 1:49 PM

## 2015-06-07 ENCOUNTER — Ambulatory Visit: Payer: Medicare Other

## 2015-06-07 ENCOUNTER — Ambulatory Visit
Admission: RE | Admit: 2015-06-07 | Discharge: 2015-06-07 | Disposition: A | Discharge status: Home / Self Care | Payer: Medicare Other | Source: Ambulatory Visit | Attending: Radiation Oncology | Admitting: Radiation Oncology

## 2015-06-07 DIAGNOSIS — Z510 Encounter for antineoplastic radiation therapy: Secondary | ICD-10-CM | POA: Diagnosis not present

## 2015-06-08 ENCOUNTER — Ambulatory Visit: Payer: Medicare Other

## 2015-06-08 ENCOUNTER — Ambulatory Visit
Admission: RE | Admit: 2015-06-08 | Discharge: 2015-06-08 | Disposition: A | Discharge status: Home / Self Care | Payer: Medicare Other | Source: Ambulatory Visit | Attending: Radiation Oncology | Admitting: Radiation Oncology

## 2015-06-08 DIAGNOSIS — Z510 Encounter for antineoplastic radiation therapy: Secondary | ICD-10-CM | POA: Diagnosis not present

## 2015-06-09 ENCOUNTER — Encounter: Payer: Self-pay | Admitting: Oncology

## 2015-06-11 ENCOUNTER — Ambulatory Visit: Payer: Medicare Other

## 2015-06-11 ENCOUNTER — Ambulatory Visit
Admission: RE | Admit: 2015-06-11 | Discharge: 2015-06-11 | Disposition: A | Discharge status: Home / Self Care | Payer: Medicare Other | Source: Ambulatory Visit | Attending: Radiation Oncology | Admitting: Radiation Oncology

## 2015-06-11 DIAGNOSIS — Z510 Encounter for antineoplastic radiation therapy: Secondary | ICD-10-CM | POA: Diagnosis not present

## 2015-06-12 ENCOUNTER — Ambulatory Visit: Payer: Medicare Other

## 2015-06-12 ENCOUNTER — Telehealth: Payer: Self-pay | Admitting: *Deleted

## 2015-06-12 ENCOUNTER — Ambulatory Visit
Admission: RE | Admit: 2015-06-12 | Discharge: 2015-06-12 | Disposition: A | Discharge status: Home / Self Care | Payer: Medicare Other | Source: Ambulatory Visit | Attending: Radiation Oncology | Admitting: Radiation Oncology

## 2015-06-12 DIAGNOSIS — Z510 Encounter for antineoplastic radiation therapy: Secondary | ICD-10-CM | POA: Diagnosis not present

## 2015-06-12 DIAGNOSIS — C773 Secondary and unspecified malignant neoplasm of axilla and upper limb lymph nodes: Principal | ICD-10-CM

## 2015-06-12 DIAGNOSIS — C50912 Malignant neoplasm of unspecified site of left female breast: Secondary | ICD-10-CM

## 2015-06-12 MED ORDER — HYDROCODONE-ACETAMINOPHEN 5-325 MG PO TABS: ORAL_TABLET | ORAL | Status: DC

## 2015-06-12 NOTE — Telephone Encounter (Signed)
rx given to patient while in clinic receiving radiation.

## 2015-06-13 ENCOUNTER — Ambulatory Visit
Admission: RE | Admit: 2015-06-13 | Discharge: 2015-06-13 | Disposition: A | Discharge status: Home / Self Care | Payer: Medicare Other | Source: Ambulatory Visit | Attending: Radiation Oncology | Admitting: Radiation Oncology

## 2015-06-13 ENCOUNTER — Ambulatory Visit: Payer: Medicare Other

## 2015-06-13 DIAGNOSIS — Z510 Encounter for antineoplastic radiation therapy: Secondary | ICD-10-CM | POA: Diagnosis not present

## 2015-06-14 ENCOUNTER — Ambulatory Visit: Payer: Medicare Other

## 2015-06-14 ENCOUNTER — Ambulatory Visit
Admission: RE | Admit: 2015-06-14 | Discharge: 2015-06-14 | Disposition: A | Discharge status: Home / Self Care | Payer: Medicare Other | Source: Ambulatory Visit | Attending: Radiation Oncology | Admitting: Radiation Oncology

## 2015-06-14 DIAGNOSIS — Z510 Encounter for antineoplastic radiation therapy: Secondary | ICD-10-CM | POA: Diagnosis not present

## 2015-06-15 ENCOUNTER — Ambulatory Visit
Admission: RE | Admit: 2015-06-15 | Discharge: 2015-06-15 | Disposition: A | Discharge status: Home / Self Care | Payer: Medicare Other | Source: Ambulatory Visit | Attending: Radiation Oncology | Admitting: Radiation Oncology

## 2015-06-15 ENCOUNTER — Ambulatory Visit: Payer: Medicare Other

## 2015-06-15 DIAGNOSIS — Z510 Encounter for antineoplastic radiation therapy: Secondary | ICD-10-CM | POA: Diagnosis not present

## 2015-06-18 ENCOUNTER — Ambulatory Visit
Admission: RE | Admit: 2015-06-18 | Discharge: 2015-06-18 | Disposition: A | Discharge status: Home / Self Care | Payer: Medicare Other | Source: Ambulatory Visit | Attending: Radiation Oncology | Admitting: Radiation Oncology

## 2015-06-18 ENCOUNTER — Ambulatory Visit: Payer: Medicare Other

## 2015-06-18 DIAGNOSIS — Z510 Encounter for antineoplastic radiation therapy: Secondary | ICD-10-CM | POA: Diagnosis not present

## 2015-06-19 ENCOUNTER — Ambulatory Visit: Payer: Medicare Other

## 2015-06-19 ENCOUNTER — Ambulatory Visit
Admission: RE | Admit: 2015-06-19 | Discharge: 2015-06-19 | Disposition: A | Discharge status: Home / Self Care | Payer: Medicare Other | Source: Ambulatory Visit | Attending: Radiation Oncology | Admitting: Radiation Oncology

## 2015-06-19 ENCOUNTER — Inpatient Hospital Stay: Payer: Medicare Other

## 2015-06-19 DIAGNOSIS — Z510 Encounter for antineoplastic radiation therapy: Secondary | ICD-10-CM | POA: Diagnosis not present

## 2015-06-20 ENCOUNTER — Ambulatory Visit
Admission: RE | Admit: 2015-06-20 | Discharge: 2015-06-20 | Disposition: A | Discharge status: Home / Self Care | Payer: Medicare Other | Source: Ambulatory Visit | Attending: Radiation Oncology | Admitting: Radiation Oncology

## 2015-06-20 ENCOUNTER — Ambulatory Visit: Payer: Medicare Other

## 2015-06-20 DIAGNOSIS — Z510 Encounter for antineoplastic radiation therapy: Secondary | ICD-10-CM | POA: Diagnosis not present

## 2015-06-25 ENCOUNTER — Ambulatory Visit: Payer: Medicare Other

## 2015-06-25 ENCOUNTER — Ambulatory Visit
Admission: RE | Admit: 2015-06-25 | Discharge: 2015-06-25 | Disposition: A | Discharge status: Home / Self Care | Payer: Medicare Other | Source: Ambulatory Visit | Attending: Radiation Oncology | Admitting: Radiation Oncology

## 2015-06-25 ENCOUNTER — Telehealth: Payer: Self-pay

## 2015-06-25 ENCOUNTER — Other Ambulatory Visit: Payer: Self-pay | Admitting: Pain Medicine

## 2015-06-25 DIAGNOSIS — Z510 Encounter for antineoplastic radiation therapy: Secondary | ICD-10-CM | POA: Diagnosis not present

## 2015-06-25 NOTE — Telephone Encounter (Signed)
Patient wants to know if you will call out her gabapentin. [place]

## 2015-06-25 NOTE — Telephone Encounter (Signed)
[name] and Nurses Patient needs appointment for refill

## 2015-06-26 ENCOUNTER — Ambulatory Visit: Payer: Medicare Other | Attending: Pain Medicine | Admitting: Pain Medicine

## 2015-06-26 ENCOUNTER — Ambulatory Visit
Admission: RE | Admit: 2015-06-26 | Discharge: 2015-06-26 | Disposition: A | Discharge status: Home / Self Care | Payer: Medicare Other | Source: Ambulatory Visit | Attending: Radiation Oncology | Admitting: Radiation Oncology

## 2015-06-26 ENCOUNTER — Ambulatory Visit: Payer: Medicare Other

## 2015-06-26 ENCOUNTER — Encounter: Payer: Self-pay | Admitting: Pain Medicine

## 2015-06-26 VITALS — BP 122/62 | HR 82 | Temp 97.8°F | Resp 16 | Wt 147.0 lb

## 2015-06-26 DIAGNOSIS — M79605 Pain in left leg: Secondary | ICD-10-CM | POA: Diagnosis present

## 2015-06-26 DIAGNOSIS — C773 Secondary and unspecified malignant neoplasm of axilla and upper limb lymph nodes: Secondary | ICD-10-CM

## 2015-06-26 DIAGNOSIS — M5136 Other intervertebral disc degeneration, lumbar region: Secondary | ICD-10-CM | POA: Insufficient documentation

## 2015-06-26 DIAGNOSIS — M79604 Pain in right leg: Secondary | ICD-10-CM | POA: Diagnosis present

## 2015-06-26 DIAGNOSIS — C50919 Malignant neoplasm of unspecified site of unspecified female breast: Secondary | ICD-10-CM | POA: Diagnosis not present

## 2015-06-26 DIAGNOSIS — M533 Sacrococcygeal disorders, not elsewhere classified: Secondary | ICD-10-CM | POA: Diagnosis not present

## 2015-06-26 DIAGNOSIS — Z510 Encounter for antineoplastic radiation therapy: Secondary | ICD-10-CM | POA: Diagnosis not present

## 2015-06-26 DIAGNOSIS — M545 Low back pain: Secondary | ICD-10-CM | POA: Diagnosis present

## 2015-06-26 DIAGNOSIS — M1610 Unilateral primary osteoarthritis, unspecified hip: Secondary | ICD-10-CM | POA: Insufficient documentation

## 2015-06-26 DIAGNOSIS — C50912 Malignant neoplasm of unspecified site of left female breast: Secondary | ICD-10-CM

## 2015-06-26 MED ORDER — GABAPENTIN 300 MG PO CAPS: ORAL_CAPSULE | ORAL | Status: DC

## 2015-06-26 MED ORDER — TRAMADOL HCL 50 MG PO TABS: ORAL_TABLET | ORAL | Status: DC

## 2015-06-26 MED ORDER — MELOXICAM 7.5 MG PO TABS: ORAL_TABLET | ORAL | Status: DC

## 2015-06-26 NOTE — Progress Notes (Signed)
Safety precautions to be maintained throughout the outpatient stay will include: orient to surroundings, keep bed in low position, maintain call bell within reach at all times, provide assistance with transfer out of bed and ambulation.  

## 2015-06-26 NOTE — Patient Instructions (Addendum)
PLAN   Continue present medication Neurontin Mobic tramadol and hydrocodone acetaminophen. Hydrocodone acetaminophen prescribed by Dr. Oliva Bustard   F/U PCP Dr. Ramonita Lab for evaliation of  BP and general medical  condition  F/U Dr.Choksi as planned  F/U surgical evaluation. Follow-up Dr. Rosanna Randy and Dr. Jonny Ruiz as discussed . Patient to follow up with Dr. Rosanna Randy and Sabra Heck regarding hip  F/U neurological evaluation.Appointment as discussed   c May consider radiofrequency rhizolysis or intraspinal procedures pending response to present treatment and F/U evaluation   Patient to call [facility] should patient have concerns prior to scheduled return appointment.

## 2015-06-26 NOTE — Progress Notes (Signed)
   Subjective:    Patient ID: [name], female    DOB: 1941-05-15, 74 y.o.   MRN: [id number]  HPI  The patient is a 74 year old female who returns to pain management Center for further evaluation and treatment of pain involving the lower back lower extremity region predominantly the patient is with history of carcinoma the [name] is undergone treatment under the direction of [name] in the oncology department. The patient returns for further evaluation and treatment of her pain. We discussed patient's condition and will continue medications as previously prescribed in pain management center.. The patient will also undergo follow-up evaluation with [name] as scheduled for this week. We will avoid interventional treatment. Patient will follow-up with [name] and [name] for further evaluation and treatment of pain involving the region of the hip. We will continue patient's medications consisting of Neurontin Mobic and Ultram. The patient will also receive her hydrocodone acetaminophen from [name] at time for evaluation this week. All were understanding and agreed to suggested treatment plan. Patient stated that her pain was well controlled present treatment regimen. He is undergo further evaluation of her hip and be considered for further treatment of the pain involving the hip as planned      Review of Systems     Objective:   Physical Exam  There was mild tenderness to palpation of the splenius capitis and a separate talus musculature regions. Palpation of these regions reproduce mild discomfort. There was mild tends to palpation of the acromioclavicular and glenohumeral joint regions palpation over the region of the cervical facet cervical paraspinal musculature region as well as the thoracic facet thoracic paraspinal musculature region reproduced minimal discomfort. Patient appeared to be with slightly decreased grip strength and Tinel and Phalen's maneuver were  without increase of pain of significant degree. Palpation over the region of the lumbar facet lumbar paraspinal must reason associated with mild discomfort. Lateral bending rotation extension and palpation over the lumbar facets reproduced minimal discomfort. There was mild tinnitus to palpation of moderate tends to palpation over the PSIS and PI I S regions. There was moderate tends to palpation in the region of the greater trochanteric region and iliotibial band region. Straight leg raise was tolerates approximately 20 without increase of pain with dorsiflexion noted. There appeared to be negative clonus negative Homans. No definite sensory deficit or dermatomal distribution detected. Abdomen nontender with no costovertebral tenderness noted.      Assessment & Plan:      Degenerative joint disease of hip    Degenerative disc disease lumbar spine   Lumbar facet syndrome   Sacroiliac joint dysfunction   Carcinoma of right breast   PLAN   Continue present medication Neurontin Mobic tramadol and hydrocodone acetaminophen. Hydrocodone acetaminophen prescribed by [name]   F/U PCP [name] for evaliation of  BP and general medical  condition  F/U [name] as planned  F/U surgical evaluation. Follow-up [name] and [name] as discussed . Patient to follow up with [name] and [name] regarding hip  F/U neurological evaluation.Appointment as discussed   c May consider radiofrequency rhizolysis or intraspinal procedures pending response to present treatment and F/U evaluation   Patient to call Pain Management Center should patient have concerns prior to scheduled return appointment.

## 2015-06-27 ENCOUNTER — Encounter: Payer: Self-pay | Admitting: Oncology

## 2015-06-27 ENCOUNTER — Inpatient Hospital Stay: Payer: Medicare Other

## 2015-06-27 ENCOUNTER — Inpatient Hospital Stay (HOSPITAL_BASED_OUTPATIENT_CLINIC_OR_DEPARTMENT_OTHER): Payer: Medicare Other | Admitting: Oncology

## 2015-06-27 ENCOUNTER — Ambulatory Visit
Admission: RE | Admit: 2015-06-27 | Discharge: 2015-06-27 | Disposition: A | Discharge status: Home / Self Care | Payer: Medicare Other | Source: Ambulatory Visit | Attending: Radiation Oncology | Admitting: Radiation Oncology

## 2015-06-27 ENCOUNTER — Ambulatory Visit: Payer: Medicare Other

## 2015-06-27 VITALS — BP 125/78 | HR 83 | Temp 97.4°F | Wt 148.8 lb

## 2015-06-27 DIAGNOSIS — C50912 Malignant neoplasm of unspecified site of left female breast: Secondary | ICD-10-CM

## 2015-06-27 DIAGNOSIS — Z171 Estrogen receptor negative status [ER-]: Secondary | ICD-10-CM | POA: Diagnosis not present

## 2015-06-27 DIAGNOSIS — C773 Secondary and unspecified malignant neoplasm of axilla and upper limb lymph nodes: Principal | ICD-10-CM

## 2015-06-27 DIAGNOSIS — Z5112 Encounter for antineoplastic immunotherapy: Secondary | ICD-10-CM | POA: Diagnosis not present

## 2015-06-27 DIAGNOSIS — Z510 Encounter for antineoplastic radiation therapy: Secondary | ICD-10-CM | POA: Diagnosis not present

## 2015-06-27 DIAGNOSIS — Z79899 Other long term (current) drug therapy: Secondary | ICD-10-CM

## 2015-06-27 DIAGNOSIS — Z87891 Personal history of nicotine dependence: Secondary | ICD-10-CM | POA: Diagnosis not present

## 2015-06-27 LAB — COMPREHENSIVE METABOLIC PANEL
ALBUMIN: 3.9 g/dL (ref 3.5–5.0)
ALT: 21 U/L (ref 14–54)
AST: 26 U/L (ref 15–41)
Alkaline Phosphatase: 52 U/L (ref 38–126)
Anion gap: 8 (ref 5–15)
BILIRUBIN TOTAL: 0.5 mg/dL (ref 0.3–1.2)
BUN: 17 mg/dL (ref 6–20)
CHLORIDE: 101 mmol/L (ref 101–111)
CO2: 29 mmol/L (ref 22–32)
CREATININE: 0.77 mg/dL (ref 0.44–1.00)
Calcium: 8.8 mg/dL — ABNORMAL LOW (ref 8.9–10.3)
GFR calc Af Amer: >60 mL/min (ref >60)
GLUCOSE: 122 mg/dL — AB (ref 65–99)
POTASSIUM: 3.8 mmol/L (ref 3.5–5.1)
Sodium: 138 mmol/L (ref 135–145)
TOTAL PROTEIN: 6.2 g/dL — AB (ref 6.5–8.1)

## 2015-06-27 LAB — CBC WITH DIFFERENTIAL/PLATELET
BASOS ABS: 0 10*3/uL (ref 0–0.1)
BASOS PCT: 1 %
Eosinophils Absolute: 0.1 10*3/uL (ref 0–0.7)
Eosinophils Relative: 3 %
HEMATOCRIT: 35.8 % (ref 35.0–47.0)
Hemoglobin: 11.9 g/dL — ABNORMAL LOW (ref 12.0–16.0)
LYMPHS PCT: 13 %
Lymphs Abs: 0.6 10*3/uL — ABNORMAL LOW (ref 1.0–3.6)
MCH: 32.2 pg (ref 26.0–34.0)
MCHC: 33.1 g/dL (ref 32.0–36.0)
MCV: 97.2 fL (ref 80.0–100.0)
Monocytes Absolute: 0.4 10*3/uL (ref 0.2–0.9)
Monocytes Relative: 9 %
NEUTROS ABS: 3.2 10*3/uL (ref 1.4–6.5)
NEUTROS PCT: 74 %
Platelets: 257 10*3/uL (ref 150–440)
RBC: 3.68 MIL/uL — AB (ref 3.80–5.20)
RDW: 15.6 % — ABNORMAL HIGH (ref 11.5–14.5)
WBC: 4.3 10*3/uL (ref 3.6–11.0)

## 2015-06-27 MED ORDER — SODIUM CHLORIDE 0.9 % IV SOLN
Freq: Once | INTRAVENOUS | Status: AC
  Administered 2015-06-27: 11:00:00 via INTRAVENOUS
  Filled 2015-06-27: qty 1000

## 2015-06-27 MED ORDER — INFLUENZA VAC SPLIT QUAD 0.5 ML IM SUSY: 0.5000 mL | PREFILLED_SYRINGE | Freq: Once | INTRAMUSCULAR | Status: DC

## 2015-06-27 MED ORDER — TRASTUZUMAB CHEMO INJECTION 440 MG
6.0000 mg/kg | Freq: Once | INTRAVENOUS | Status: AC
  Administered 2015-06-27: 399 mg via INTRAVENOUS
  Filled 2015-06-27: qty 19

## 2015-06-27 MED ORDER — HEPARIN SOD (PORK) LOCK FLUSH 100 UNIT/ML IV SOLN
500.0000 [IU] | Freq: Once | INTRAVENOUS | Status: AC | PRN
  Administered 2015-06-27: 500 [IU]
  Filled 2015-06-27 (×2): qty 5

## 2015-06-27 MED ORDER — ACETAMINOPHEN 325 MG PO TABS
650.0000 mg | ORAL_TABLET | Freq: Once | ORAL | Status: AC
  Administered 2015-06-27: 650 mg via ORAL
  Filled 2015-06-27: qty 2

## 2015-06-27 MED ORDER — SODIUM CHLORIDE 0.9 % IJ SOLN
10.0000 mL | INTRAMUSCULAR | Status: DC | PRN
  Administered 2015-06-27: 10 mL
  Filled 2015-06-27: qty 10

## 2015-06-27 NOTE — Progress Notes (Signed)
[place] @ [facility] [phone]  Fax:(336) [place] OB: 12-19-1940  MR#: [id number]  EEF#:[id number]  Patient Care Team: [name], [name], MD (General Surgery) Self Requested  CHIEF COMPLAINT:  No chief complaint on file.  Oncology History   1.  Abnormal mammogram of the left breast.  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  Progesterone receptor negative.  HER-2/neu receptor positive 2.  Patient is starting [facility] from July OF 2016   3.  Patient received lost [facility] chemotherapy on May 16, 2015 4.  She  will be evaluated by radiation oncologist and will continue Herceptin maintenance therapy 5.  Radiation therapy has been started in October of 2016   VISIT DIAGNOSIS:   Carcinoma of left breast    Oncology Flowsheet 01/31/2015 02/21/2015 03/14/2015 04/04/2015 04/25/2015 05/16/2015 06/06/2015  Day, Cycle Day 1, Cycle 1 Day 1, Cycle 2 Day 1, Cycle 3 Day 1, Cycle 4 Day 1, Cycle 5 Day 1, Cycle 6 -  CARBOplatin (PARAPLATIN) IV 470 _0  mg -  dexamethasone (DECADRON) IJ - - - - - - -  dexamethasone (DECADRON) IV [ 12 mg ] [ 12 mg ] [ 12 mg ] [ 12 mg ] [ 12 mg ] [ 12 mg ] -  diphenhydrAMINE (BENADRYL) PO 50 mg 50 mg - - - - -  DOCEtaxel (TAXOTERE) IV 70 mg/m2 70 mg/m2 70 mg/m2 70 mg/m2 70 mg/m2 70 mg/m2 -  fosaprepitant (EMEND) IV [ 150 mg ] [ 150 mg ] [ 150 mg ] [ 150 mg ] [ 150 mg ] [ 150 mg ] -  ondansetron (ZOFRAN) IV - - - - - - -  palonosetron (ALOXI) IV 0.25 mg 0.25 mg 0.25 mg 0.25 mg 0.25 mg 0.25 mg -  pegfilgrastim (NEULASTA ONPRO KIT) [place] 6 _1  mg -  trastuzumab (HERCEPTIN) IV 8 mg/kg 6 mg/kg 6 mg/kg 6 mg/kg 6 mg/kg 6 mg/kg 6 mg/kg    INTERVAL  HISTORY:  74 year old lady who had a regular mammogram recent mammogram with abnormal ultrasound was done.  There were 3 nodules were found.  Patient underwent stereotactic ultrasound-guided biopsy.  3 nodules were 4 cm 5 cm in 6 cm away from nipple.  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  Patient is here to initiate maintenance Herceptin therapy Started radiation therapy Plan is to get hip surgery done sometime in middle lobe, January Patient had a repeat MUGA scan which is more than 50% at present time  Review of systems Alopecia. Somewhat weak and tired but no chills and fever No shortness of breath no swelling of lower extremity.  No palpitation.  No chest pain. Review of follow this systems have been normal  Ration is here for ongoing evaluation and continuation of treatment.  No shortness of breath no chest pain. PAST MEDICAL HISTORY: Past Medical History  Diagnosis Date  . 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 Hearing loss   . Memory impairment     seen by Dr Manuella Ghazi  possible post crainiotomy from radiation  .  Breast cancer of upper-inner quadrant of left female breast ([place]) 12/15/14    Left breast invasive mammary carcinoma, T1c (1.5 cm) ER negative, [name], [name].    PAST SURGICAL HISTORY: Past Surgical History  Procedure Laterality Date  . Appendectomy  1950  . Brain surgery  1995    left frontal/temporal  . Colonoscopy  2010    Dr. [name]  . Breast biopsy Left 1997  . 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 Breast biopsy Left 01/08/2015    Procedure: BREAST BIOPSY WITH NEEDLE LOCALIZATION;  Surgeon: [name], MD;  Location: [facility] ORS;  Service: General;  Laterality: Left;  . Breast lumpectomy Left 01/08/2015    Procedure: LUMPECTOMY;  Surgeon: [name], MD;  Location: [facility] ORS;  Service: General;  Laterality: Left;  . Sentinel node biopsy Left 01/16/2015     Procedure: SENTINEL NODE BIOPSY;  Surgeon: [name], MD;  Location: [facility] ORS;  Service: General;  Laterality: Left;  . Portacath placement Right 01/16/2015    Procedure: INSERTION PORT-A-CATH;  Surgeon: [name], MD;  Location: [facility] ORS;  Service: General;  Laterality: Right;    FAMILY HISTORY Family History  Problem Relation Age of Onset  . Breast cancer Sister 72  . Addison's disease Mother   . Stroke Father            HEALTH MAINTENANCE: Social History  Substance Use Topics  . Smoking status: Former Smoker    Quit date: 12/28/1964  . Smokeless tobacco: Never Used  . Alcohol Use: 0.6 - 1.2 oz/week    1-2 Glasses of wine per week     Comment: 1 Glass Wine / Night      No Known Allergies  Current Outpatient Prescriptions  Medication Sig Dispense Refill  . Ascorbic Acid (VITAMIN C) 100 MG tablet Take 100 mg by mouth every morning.     . Calcium Carbonate-Vit D-Min (CALTRATE 600+D PLUS MINERALS) 600-800 MG-UNIT TABS Take 1 tablet by mouth every morning.    . calcium-vitamin D 250-100 MG-UNIT per tablet Take 1 tablet by mouth 1 day or 1 dose. 600 mg    . dexamethasone (DECADRON) 4 MG tablet Take 2 tablets (8 mg total) by mouth 2 (two) times daily. Start the day before Taxotere. Then again the day after chemo. 30 tablet 1  . gabapentin (NEURONTIN) 300 MG capsule TAKE ONE CAPSULE AT BEDTIME 30 capsule 1  . gabapentin (NEURONTIN) 300 MG capsule Limit 2-4 tabs by mouth per day if tolerated 120 capsule 0  . HYDROcodone-acetaminophen (NORCO/VICODIN) 5-325 MG tablet Limit one tablet by mouth per day or twice per day if tolerated 30 tablet 0  . lidocaine-prilocaine (EMLA) cream Apply 1 application topically as needed. 30 g 3  . meloxicam (MOBIC) 7.5 MG tablet Limit one tablet by mouth per day if tolerated 30 tablet 0  . traMADol (ULTRAM) 50 MG tablet Limit 1 tab by mouth to 2 - 4 times per day if tolerated 120 tablet 0  . Triprolidine-Pseudoephedrine (ANTIHISTAMINE  PO) Take by mouth every morning.      No current facility-administered medications for this visit.    OBJECTIVE: GENERAL:  Well developed, well nourished, sitting comfortably in the exam room in no acute distress. Fatigue  grade 1 MENTAL STATUS:  Alert and oriented to person, place and time.  ENT: No evidence of stomatitisI.  Alopecia  RESPIRATORY:  Clear to auscultation without rales, wheezes or rhonchi. CARDIOVASCULAR:  Regular  rate and rhythm without murmur, rub or gallop. Port site within normal limit ABDOMEN:  Soft, non-tender, with active bowel sounds, and no hepatosplenomegaly.  No masses. BACK:  No CVA tenderness.  No tenderness on percussion of the back or rib cage. SKIN:  No rashes, ulcers or lesions. EXTREMITIES: No edema, no skin discoloration or tenderness.  No palpable cords. LYMPH NODES: No palpable cervical, supraclavicular, axillary or inguinal adenopathy  NEUROLOGICAL: Unremarkable. PSYCH:  Appropriate.  Filed Vitals:   06/27/15 0917  BP: 125/78  Pulse: 83  Temp: 97.4 F (36.3 C)     Body mass index is 25.53 kg/(m^2).    ECOG FS:1 - Symptomatic but completely ambulatory  LAB RESULTS:  Appointment on 06/27/2015  Component Date Value Ref Range Status  . WBC 06/27/2015 4.3  3.6 - 11.0 K/uL Final   A-LINE DRAW  . RBC 06/27/2015 3.68* 3.80 - 5.20 MIL/uL Final  . Hemoglobin 06/27/2015 11.9* 12.0 - 16.0 g/dL Final  . HCT 06/27/2015 35.8  35.0 - 47.0 % Final  . MCV 06/27/2015 97.2  80.0 - 100.0 fL Final  . MCH 06/27/2015 32.2  26.0 - 34.0 pg Final  . MCHC 06/27/2015 33.1  32.0 - 36.0 g/dL Final  . RDW 06/27/2015 15.6* 11.5 - 14.5 % Final  . Platelets 06/27/2015 257  150 - 440 K/uL Final  . Neutrophils Relative % 06/27/2015 74   Final  . Neutro Abs 06/27/2015 3.2  1.4 - 6.5 K/uL Final  . Lymphocytes Relative 06/27/2015 13   Final  . Lymphs Abs 06/27/2015 0.6* 1.0 - 3.6 K/uL Final  . Monocytes Relative 06/27/2015 9   Final  . Monocytes Absolute 06/27/2015 0.4   0.2 - 0.9 K/uL Final  . Eosinophils Relative 06/27/2015 3   Final  . Eosinophils Absolute 06/27/2015 0.1  0 - 0.7 K/uL Final  . Basophils Relative 06/27/2015 1   Final  . Basophils Absolute 06/27/2015 0.0  0 - 0.1 K/uL Final  . Sodium 06/27/2015 138  135 - 145 mmol/L Final  . Potassium 06/27/2015 3.8  3.5 - 5.1 mmol/L Final  . Chloride 06/27/2015 101  101 - 111 mmol/L Final  . CO2 06/27/2015 29  22 - 32 mmol/L Final  . Glucose, Bld 06/27/2015 122* 65 - 99 mg/dL Final  . BUN 06/27/2015 17  6 - 20 mg/dL Final  . Creatinine, Ser 06/27/2015 0.77  0.44 - 1.00 mg/dL Final  . Calcium 06/27/2015 8.8* 8.9 - 10.3 mg/dL Final  . Total Protein 06/27/2015 6.2* 6.5 - 8.1 g/dL Final  . Albumin 06/27/2015 3.9  3.5 - 5.0 g/dL Final  . AST 06/27/2015 26  15 - 41 U/L Final  . ALT 06/27/2015 21  14 - 54 U/L Final  . Alkaline Phosphatase 06/27/2015 52  38 - 126 U/L Final  . Total Bilirubin 06/27/2015 0.5  0.3 - 1.2 mg/dL Final  . GFR calc non Af Amer 06/27/2015 >60  >60 mL/min Final  . GFR calc Af Amer 06/27/2015 >60  >60 mL/min Final   Comment: (NOTE) The eGFR has been calculated using the CKD EPI equation. This calculation has not been validated in all clinical situations. eGFR's persistently <60 mL/min signify possible Chronic Kidney Disease.   . Anion gap 06/27/2015 8  5 - 15 Final     ASSESSMENT:  58 -year-old lady with a history of carcinoma breast.   T1c N1 C micro-M0  Will repeat one more cycle of treatment followed by MUGA scan of the heart.  We may give patient a break from treatment during Christmas and patient has been planned to have a hip surgery. Repeat MUGA scan of the heart after break to see whether there is any significant improvement in her cardiac function for [place] to resume Herceptin therapy   [name], MD   06/27/2015 9:33 AM

## 2015-06-28 ENCOUNTER — Ambulatory Visit: Admission: RE | Admit: 2015-06-28 | Payer: Medicare Other | Source: Ambulatory Visit

## 2015-06-28 ENCOUNTER — Ambulatory Visit: Payer: Medicare Other

## 2015-06-29 ENCOUNTER — Ambulatory Visit: Payer: Medicare Other

## 2015-06-29 ENCOUNTER — Ambulatory Visit
Admission: RE | Admit: 2015-06-29 | Discharge: 2015-06-29 | Disposition: A | Discharge status: Home / Self Care | Payer: Medicare Other | Source: Ambulatory Visit | Attending: Radiation Oncology | Admitting: Radiation Oncology

## 2015-06-29 DIAGNOSIS — Z510 Encounter for antineoplastic radiation therapy: Secondary | ICD-10-CM | POA: Diagnosis not present

## 2015-07-02 ENCOUNTER — Telehealth: Payer: Self-pay | Admitting: *Deleted

## 2015-07-02 ENCOUNTER — Ambulatory Visit
Admission: RE | Admit: 2015-07-02 | Discharge: 2015-07-02 | Disposition: A | Discharge status: Home / Self Care | Payer: Medicare Other | Source: Ambulatory Visit | Attending: Radiation Oncology | Admitting: Radiation Oncology

## 2015-07-02 ENCOUNTER — Ambulatory Visit: Payer: Medicare Other

## 2015-07-02 DIAGNOSIS — C773 Secondary and unspecified malignant neoplasm of axilla and upper limb lymph nodes: Principal | ICD-10-CM

## 2015-07-02 DIAGNOSIS — Z510 Encounter for antineoplastic radiation therapy: Secondary | ICD-10-CM | POA: Diagnosis not present

## 2015-07-02 DIAGNOSIS — C50912 Malignant neoplasm of unspecified site of left female breast: Secondary | ICD-10-CM

## 2015-07-02 MED ORDER — HYDROCODONE-ACETAMINOPHEN 5-325 MG PO TABS: ORAL_TABLET | ORAL | Status: DC

## 2015-07-02 NOTE — Telephone Encounter (Signed)
Refill given to patient while in clinic

## 2015-07-03 ENCOUNTER — Ambulatory Visit: Payer: Medicare Other

## 2015-07-03 ENCOUNTER — Inpatient Hospital Stay: Payer: Medicare Other | Attending: Oncology

## 2015-07-03 ENCOUNTER — Ambulatory Visit
Admission: RE | Admit: 2015-07-03 | Discharge: 2015-07-03 | Disposition: A | Discharge status: Home / Self Care | Payer: Medicare Other | Source: Ambulatory Visit | Attending: Radiation Oncology | Admitting: Radiation Oncology

## 2015-07-03 DIAGNOSIS — Z510 Encounter for antineoplastic radiation therapy: Secondary | ICD-10-CM | POA: Diagnosis not present

## 2015-07-03 DIAGNOSIS — C50212 Malignant neoplasm of upper-inner quadrant of left female breast: Secondary | ICD-10-CM | POA: Insufficient documentation

## 2015-07-03 DIAGNOSIS — Z79899 Other long term (current) drug therapy: Secondary | ICD-10-CM | POA: Insufficient documentation

## 2015-07-03 DIAGNOSIS — Z171 Estrogen receptor negative status [ER-]: Secondary | ICD-10-CM | POA: Insufficient documentation

## 2015-07-03 DIAGNOSIS — Z87891 Personal history of nicotine dependence: Secondary | ICD-10-CM | POA: Insufficient documentation

## 2015-07-03 DIAGNOSIS — Z5112 Encounter for antineoplastic immunotherapy: Secondary | ICD-10-CM | POA: Insufficient documentation

## 2015-07-04 ENCOUNTER — Ambulatory Visit: Payer: Medicare Other

## 2015-07-04 ENCOUNTER — Ambulatory Visit
Admission: RE | Admit: 2015-07-04 | Discharge: 2015-07-04 | Disposition: A | Discharge status: Home / Self Care | Payer: Medicare Other | Source: Ambulatory Visit | Attending: Radiation Oncology | Admitting: Radiation Oncology

## 2015-07-04 DIAGNOSIS — Z510 Encounter for antineoplastic radiation therapy: Secondary | ICD-10-CM | POA: Diagnosis not present

## 2015-07-05 ENCOUNTER — Ambulatory Visit: Payer: Medicare Other

## 2015-07-05 ENCOUNTER — Ambulatory Visit
Admission: RE | Admit: 2015-07-05 | Discharge: 2015-07-05 | Disposition: A | Discharge status: Home / Self Care | Payer: Medicare Other | Source: Ambulatory Visit | Attending: Radiation Oncology | Admitting: Radiation Oncology

## 2015-07-05 DIAGNOSIS — Z510 Encounter for antineoplastic radiation therapy: Secondary | ICD-10-CM | POA: Diagnosis not present

## 2015-07-06 ENCOUNTER — Ambulatory Visit
Admission: RE | Admit: 2015-07-06 | Discharge: 2015-07-06 | Disposition: A | Discharge status: Home / Self Care | Payer: Medicare Other | Source: Ambulatory Visit | Attending: Radiation Oncology | Admitting: Radiation Oncology

## 2015-07-06 ENCOUNTER — Ambulatory Visit: Payer: Medicare Other

## 2015-07-06 DIAGNOSIS — Z510 Encounter for antineoplastic radiation therapy: Secondary | ICD-10-CM | POA: Diagnosis not present

## 2015-07-09 ENCOUNTER — Ambulatory Visit: Payer: Medicare Other

## 2015-07-09 ENCOUNTER — Ambulatory Visit
Admission: RE | Admit: 2015-07-09 | Discharge: 2015-07-09 | Disposition: A | Discharge status: Home / Self Care | Payer: Medicare Other | Source: Ambulatory Visit | Attending: Radiation Oncology | Admitting: Radiation Oncology

## 2015-07-09 DIAGNOSIS — Z510 Encounter for antineoplastic radiation therapy: Secondary | ICD-10-CM | POA: Diagnosis not present

## 2015-07-10 ENCOUNTER — Ambulatory Visit
Admission: RE | Admit: 2015-07-10 | Discharge: 2015-07-10 | Disposition: A | Discharge status: Home / Self Care | Payer: Medicare Other | Source: Ambulatory Visit | Attending: Radiation Oncology | Admitting: Radiation Oncology

## 2015-07-10 ENCOUNTER — Ambulatory Visit: Payer: Medicare Other

## 2015-07-10 DIAGNOSIS — Z510 Encounter for antineoplastic radiation therapy: Secondary | ICD-10-CM | POA: Diagnosis not present

## 2015-07-11 ENCOUNTER — Ambulatory Visit: Payer: Medicare Other

## 2015-07-11 ENCOUNTER — Ambulatory Visit
Admission: RE | Admit: 2015-07-11 | Discharge: 2015-07-11 | Disposition: A | Discharge status: Home / Self Care | Payer: Medicare Other | Source: Ambulatory Visit | Attending: Radiation Oncology | Admitting: Radiation Oncology

## 2015-07-11 DIAGNOSIS — Z510 Encounter for antineoplastic radiation therapy: Secondary | ICD-10-CM | POA: Diagnosis not present

## 2015-07-12 ENCOUNTER — Ambulatory Visit: Payer: Medicare Other

## 2015-07-12 ENCOUNTER — Ambulatory Visit
Admission: RE | Admit: 2015-07-12 | Discharge: 2015-07-12 | Disposition: A | Discharge status: Home / Self Care | Payer: Medicare Other | Source: Ambulatory Visit | Attending: Radiation Oncology | Admitting: Radiation Oncology

## 2015-07-12 DIAGNOSIS — Z510 Encounter for antineoplastic radiation therapy: Secondary | ICD-10-CM | POA: Diagnosis not present

## 2015-07-13 ENCOUNTER — Ambulatory Visit: Payer: Medicare Other

## 2015-07-13 ENCOUNTER — Ambulatory Visit
Admission: RE | Admit: 2015-07-13 | Discharge: 2015-07-13 | Disposition: A | Discharge status: Home / Self Care | Payer: Medicare Other | Source: Ambulatory Visit | Attending: Radiation Oncology | Admitting: Radiation Oncology

## 2015-07-13 DIAGNOSIS — Z510 Encounter for antineoplastic radiation therapy: Secondary | ICD-10-CM | POA: Diagnosis not present

## 2015-07-16 ENCOUNTER — Ambulatory Visit
Admission: RE | Admit: 2015-07-16 | Discharge: 2015-07-16 | Disposition: A | Discharge status: Home / Self Care | Payer: Medicare Other | Source: Ambulatory Visit | Attending: Radiation Oncology | Admitting: Radiation Oncology

## 2015-07-16 ENCOUNTER — Ambulatory Visit: Payer: Medicare Other

## 2015-07-16 DIAGNOSIS — Z510 Encounter for antineoplastic radiation therapy: Secondary | ICD-10-CM | POA: Diagnosis not present

## 2015-07-17 ENCOUNTER — Ambulatory Visit: Payer: Medicare Other

## 2015-07-17 ENCOUNTER — Ambulatory Visit
Admission: RE | Admit: 2015-07-17 | Discharge: 2015-07-17 | Disposition: A | Discharge status: Home / Self Care | Payer: Medicare Other | Source: Ambulatory Visit | Attending: Radiation Oncology | Admitting: Radiation Oncology

## 2015-07-17 ENCOUNTER — Inpatient Hospital Stay: Payer: Medicare Other

## 2015-07-17 DIAGNOSIS — Z510 Encounter for antineoplastic radiation therapy: Secondary | ICD-10-CM | POA: Diagnosis not present

## 2015-07-18 ENCOUNTER — Ambulatory Visit: Payer: Medicare Other

## 2015-07-18 ENCOUNTER — Inpatient Hospital Stay: Payer: Medicare Other

## 2015-07-18 ENCOUNTER — Inpatient Hospital Stay (HOSPITAL_BASED_OUTPATIENT_CLINIC_OR_DEPARTMENT_OTHER): Payer: Medicare Other | Admitting: Oncology

## 2015-07-18 ENCOUNTER — Ambulatory Visit
Admission: RE | Admit: 2015-07-18 | Discharge: 2015-07-18 | Disposition: A | Discharge status: Home / Self Care | Payer: Medicare Other | Source: Ambulatory Visit | Attending: Radiation Oncology | Admitting: Radiation Oncology

## 2015-07-18 ENCOUNTER — Encounter: Payer: Self-pay | Admitting: Oncology

## 2015-07-18 VITALS — BP 113/71 | HR 77 | Temp 97.2°F | Resp 18 | Wt 146.2 lb

## 2015-07-18 DIAGNOSIS — C50212 Malignant neoplasm of upper-inner quadrant of left female breast: Secondary | ICD-10-CM

## 2015-07-18 DIAGNOSIS — Z87891 Personal history of nicotine dependence: Secondary | ICD-10-CM

## 2015-07-18 DIAGNOSIS — Z171 Estrogen receptor negative status [ER-]: Secondary | ICD-10-CM

## 2015-07-18 DIAGNOSIS — Z79899 Other long term (current) drug therapy: Secondary | ICD-10-CM

## 2015-07-18 DIAGNOSIS — C50912 Malignant neoplasm of unspecified site of left female breast: Secondary | ICD-10-CM

## 2015-07-18 DIAGNOSIS — D497 Neoplasm of unspecified behavior of endocrine glands and other parts of nervous system: Secondary | ICD-10-CM

## 2015-07-18 DIAGNOSIS — C773 Secondary and unspecified malignant neoplasm of axilla and upper limb lymph nodes: Principal | ICD-10-CM

## 2015-07-18 DIAGNOSIS — Z510 Encounter for antineoplastic radiation therapy: Secondary | ICD-10-CM | POA: Diagnosis not present

## 2015-07-18 DIAGNOSIS — Z5112 Encounter for antineoplastic immunotherapy: Secondary | ICD-10-CM | POA: Diagnosis not present

## 2015-07-18 HISTORY — DX: Neoplasm of unspecified behavior of endocrine glands and other parts of nervous system: D49.7

## 2015-07-18 LAB — COMPREHENSIVE METABOLIC PANEL
ALBUMIN: 4.4 g/dL (ref 3.5–5.0)
ALK PHOS: 64 U/L (ref 38–126)
ALT: 19 U/L (ref 14–54)
ANION GAP: 10 (ref 5–15)
AST: 21 U/L (ref 15–41)
BILIRUBIN TOTAL: 0.5 mg/dL (ref 0.3–1.2)
BUN: 15 mg/dL (ref 6–20)
CALCIUM: 9.1 mg/dL (ref 8.9–10.3)
CO2: 30 mmol/L (ref 22–32)
Chloride: 99 mmol/L — ABNORMAL LOW (ref 101–111)
Creatinine, Ser: 0.82 mg/dL (ref 0.44–1.00)
GLUCOSE: 117 mg/dL — AB (ref 65–99)
Potassium: 3.8 mmol/L (ref 3.5–5.1)
Sodium: 139 mmol/L (ref 135–145)
TOTAL PROTEIN: 6.9 g/dL (ref 6.5–8.1)

## 2015-07-18 LAB — CBC WITH DIFFERENTIAL/PLATELET
BASOS ABS: 0 10*3/uL (ref 0–0.1)
BASOS PCT: 1 %
EOS ABS: 0.4 10*3/uL (ref 0–0.7)
EOS PCT: 9 %
HEMATOCRIT: 41.4 % (ref 35.0–47.0)
HEMOGLOBIN: 13.6 g/dL (ref 12.0–16.0)
Lymphocytes Relative: 10 %
Lymphs Abs: 0.5 10*3/uL — ABNORMAL LOW (ref 1.0–3.6)
MCH: 31.6 pg (ref 26.0–34.0)
MCHC: 32.8 g/dL (ref 32.0–36.0)
MCV: 96.3 fL (ref 80.0–100.0)
MONO ABS: 0.3 10*3/uL (ref 0.2–0.9)
MONOS PCT: 6 %
NEUTROS ABS: 3.6 10*3/uL (ref 1.4–6.5)
Neutrophils Relative %: 74 %
Platelets: 248 10*3/uL (ref 150–440)
RBC: 4.3 MIL/uL (ref 3.80–5.20)
RDW: 14.8 % — AB (ref 11.5–14.5)
WBC: 4.9 10*3/uL (ref 3.6–11.0)

## 2015-07-18 LAB — MAGNESIUM: MAGNESIUM: 1.9 mg/dL (ref 1.7–2.4)

## 2015-07-18 MED ORDER — SODIUM CHLORIDE 0.9 % IJ SOLN
10.0000 mL | INTRAMUSCULAR | Status: DC | PRN
  Filled 2015-07-18: qty 10

## 2015-07-18 MED ORDER — TRASTUZUMAB CHEMO INJECTION 440 MG
6.0000 mg/kg | Freq: Once | INTRAVENOUS | Status: AC
  Administered 2015-07-18: 399 mg via INTRAVENOUS
  Filled 2015-07-18: qty 19

## 2015-07-18 MED ORDER — SODIUM CHLORIDE 0.9 % IV SOLN
Freq: Once | INTRAVENOUS | Status: AC
  Administered 2015-07-18: 10:00:00 via INTRAVENOUS
  Filled 2015-07-18: qty 1000

## 2015-07-18 MED ORDER — HEPARIN SOD (PORK) LOCK FLUSH 100 UNIT/ML IV SOLN
500.0000 [IU] | Freq: Once | INTRAVENOUS | Status: AC | PRN
  Administered 2015-07-18: 500 [IU]
  Filled 2015-07-18: qty 5

## 2015-07-18 MED ORDER — ACETAMINOPHEN 325 MG PO TABS
650.0000 mg | ORAL_TABLET | Freq: Once | ORAL | Status: AC
  Administered 2015-07-18: 650 mg via ORAL
  Filled 2015-07-18: qty 2

## 2015-07-18 NOTE — Progress Notes (Signed)
[place] @ [facility] [phone]  Fax:(336) [place] OB: 1940-11-26  MR#: [id number]  TML#:[id number]  Patient Care Team: [name], [name], MD (General Surgery) Self Requested  CHIEF COMPLAINT:  Chief Complaint  Patient presents with  . Breast Cancer   Oncology History   1.  Abnormal mammogram of the left breast.  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  Progesterone receptor negative.  HER-2/neu receptor positive 2.  Patient is starting [facility] from July OF 2016   3.  Patient received lost [facility] chemotherapy on May 16, 2015 4.  She  will be evaluated by radiation oncologist and will continue Herceptin maintenance therapy 5.  Radiation therapy has been started in October of 2016  patient will finish radiation therapy in December of 2016 (December 27 )  Herceptin would be put on hold because of low ejection fraction and planned hip surgery ( last treatment given was in December 21 st  2016 )   VISIT DIAGNOSIS:   Carcinoma of left breast    Oncology Flowsheet 03/14/2015 04/04/2015 04/25/2015 05/16/2015 06/06/2015 06/27/2015 07/18/2015  Day, Cycle Day 1, Cycle 3 Day 1, Cycle 4 Day 1, Cycle 5 Day 1, Cycle 6 - - -  CARBOplatin (PARAPLATIN) IV 470 mg 470 mg 470 mg 470 mg - - -  dexamethasone (DECADRON) IJ - - - - - - -  dexamethasone (DECADRON) IV [ 12 mg ] [ 12 mg ] [ 12 mg ] [ 12 mg ] - - -  diphenhydrAMINE (BENADRYL) PO - - - - - - -  DOCEtaxel (TAXOTERE) IV 70 mg/m2 70 mg/m2 70 mg/m2 70 mg/m2 - - -  fosaprepitant (EMEND) IV [ 150 mg ] [ 150 mg ] [ 150 mg ] [ 150 mg ] - - -  ondansetron (ZOFRAN) IV - - - - - - -  palonosetron (ALOXI) IV 0.25 mg 0.25 mg 0.25 mg 0.25 mg - - -  pegfilgrastim  (NEULASTA ONPRO KIT) [place] 6 mg 6 mg 6 mg 6 mg - - -  trastuzumab (HERCEPTIN) IV 6 mg/kg 6 mg/kg 6 mg/kg 6 mg/kg 6 mg/kg 6 mg/kg 6 mg/kg    INTERVAL HISTORY:  74 year old lady who had a regular mammogram recent mammogram with abnormal ultrasound was done.  There were 3 nodules were found.  Patient underwent stereotactic ultrasound-guided biopsy.  3 nodules were 4 cm 5 cm in 6 cm away from nipple.  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  Patient is here to initiate maintenance Herceptin therapy Started radiation therapy Plan is to get hip surgery done sometime in middle lobe, January Patient had a repeat MUGA scan which is more than 50% at present time  Review of systems Alopecia. Somewhat weak and tired but no chills and fever No shortness of breath no swelling of lower extremity.  No palpitation.  No chest pain. Review of follow this systems have been normal  no chills fever no abdominal pain no nausea no vomiting no diarrhea.  He pain continues.  Ration is here for ongoing evaluation and continuation of treatment.  No shortness of breath no chest pain. PAST MEDICAL HISTORY: Past Medical History  Diagnosis  Date  . 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 Hearing loss   . Memory impairment     seen by Dr Manuella Ghazi  possible post crainiotomy from radiation  . Breast cancer of upper-inner quadrant of left female breast ([place]) 12/15/14    Left breast invasive mammary carcinoma, T1c (1.5 cm) ER negative, [name], [name].    PAST SURGICAL HISTORY: Past Surgical History  Procedure Laterality Date  . Appendectomy  1950  . Brain surgery  1995    left frontal/temporal  . Colonoscopy  2010    Dr. [name]  . Breast biopsy Left 1997  . 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 Breast biopsy Left 01/08/2015    Procedure: BREAST BIOPSY WITH NEEDLE LOCALIZATION;  Surgeon: [name], MD;  Location: [facility] ORS;   Service: General;  Laterality: Left;  . Breast lumpectomy Left 01/08/2015    Procedure: LUMPECTOMY;  Surgeon: [name], MD;  Location: [facility] ORS;  Service: General;  Laterality: Left;  . Sentinel node biopsy Left 01/16/2015    Procedure: SENTINEL NODE BIOPSY;  Surgeon: [name], MD;  Location: [facility] ORS;  Service: General;  Laterality: Left;  . Portacath placement Right 01/16/2015    Procedure: INSERTION PORT-A-CATH;  Surgeon: [name], MD;  Location: [facility] ORS;  Service: General;  Laterality: Right;    FAMILY HISTORY Family History  Problem Relation Age of Onset  . Breast cancer Sister 73  . Addison's disease Mother   . Stroke Father            HEALTH MAINTENANCE: Social History  Substance Use Topics  . Smoking status: Former Smoker    Quit date: 12/28/1964  . Smokeless tobacco: Never Used  . Alcohol Use: 0.6 - 1.2 oz/week    1-2 Glasses of wine per week     Comment: 1 Glass Wine / Night      No Known Allergies  Current Outpatient Prescriptions  Medication Sig Dispense Refill  . Ascorbic Acid (VITAMIN C) 100 MG tablet Take 100 mg by mouth every morning.     . Calcium Carbonate-Vit D-Min (CALTRATE 600+D PLUS MINERALS) 600-800 MG-UNIT TABS Take 1 tablet by mouth every morning.    . calcium-vitamin D 250-100 MG-UNIT per tablet Take 1 tablet by mouth 1 day or 1 dose. 600 mg    . dexamethasone (DECADRON) 4 MG tablet Take 2 tablets (8 mg total) by mouth 2 (two) times daily. Start the day before Taxotere. Then again the day after chemo. 30 tablet 1  . gabapentin (NEURONTIN) 300 MG capsule TAKE ONE CAPSULE AT BEDTIME 30 capsule 1  . gabapentin (NEURONTIN) 300 MG capsule Limit 2-4 tabs by mouth per day if tolerated 120 capsule 0  . HYDROcodone-acetaminophen (NORCO/VICODIN) 5-325 MG tablet Limit one tablet by mouth per day or twice per day if tolerated 30 tablet 0  . lidocaine-prilocaine (EMLA) cream Apply 1 application topically as needed. 30 g 3  .  meloxicam (MOBIC) 7.5 MG tablet Limit one tablet by mouth per day if tolerated 30 tablet 0  . naproxen sodium (ANAPROX) 220 MG tablet Take 220 mg by mouth 2 (two) times daily with a meal.    . traMADol (ULTRAM) 50 MG tablet Limit 1 tab by mouth to 2 - 4 times per day if tolerated 120 tablet 0  . Triprolidine-Pseudoephedrine (ANTIHISTAMINE PO) Take by mouth every morning.      No current facility-administered medications for this  visit.   Facility-Administered Medications Ordered in Other Visits  Medication Dose Route Frequency Provider Last Rate Last Dose  . sodium chloride 0.9 % injection 10 mL  10 mL Intracatheter PRN [name], MD        OBJECTIVE: GENERAL:  Well developed, well nourished, sitting comfortably in the exam room in no acute distress. Fatigue  grade 1 MENTAL STATUS:  Alert and oriented to person, place and time.  ENT: No evidence of stomatitisI.  Alopecia  RESPIRATORY:  Clear to auscultation without rales, wheezes or rhonchi. CARDIOVASCULAR:  Regular rate and rhythm without murmur, rub or gallop. Port site within normal limit ABDOMEN:  Soft, non-tender, with active bowel sounds, and no hepatosplenomegaly.  No masses. BACK:  No CVA tenderness.  No tenderness on percussion of the back or rib cage. SKIN:  No rashes, ulcers or lesions. EXTREMITIES: No edema, no skin discoloration or tenderness.  No palpable cords. LYMPH NODES: No palpable cervical, supraclavicular, axillary or inguinal adenopathy  NEUROLOGICAL: Unremarkable. PSYCH:  Appropriate.  Filed Vitals:   07/18/15 0940  BP: 113/71  Pulse: 77  Temp: 97.2 F (36.2 C)  Resp: 18     Body mass index is 25.08 kg/(m^2).    ECOG FS:1 - Symptomatic but completely ambulatory  LAB RESULTS:  Appointment on 07/18/2015  Component Date Value Ref Range Status  . WBC 07/18/2015 4.9  3.6 - 11.0 K/uL Final  . RBC 07/18/2015 4.30  3.80 - 5.20 MIL/uL Final  . Hemoglobin 07/18/2015 13.6  12.0 - 16.0 g/dL Final  . HCT  07/18/2015 41.4  35.0 - 47.0 % Final  . MCV 07/18/2015 96.3  80.0 - 100.0 fL Final  . MCH 07/18/2015 31.6  26.0 - 34.0 pg Final  . MCHC 07/18/2015 32.8  32.0 - 36.0 g/dL Final  . RDW 07/18/2015 14.8* 11.5 - 14.5 % Final  . Platelets 07/18/2015 248  150 - 440 K/uL Final  . Neutrophils Relative % 07/18/2015 74   Final  . Neutro Abs 07/18/2015 3.6  1.4 - 6.5 K/uL Final  . Lymphocytes Relative 07/18/2015 10   Final  . Lymphs Abs 07/18/2015 0.5* 1.0 - 3.6 K/uL Final  . Monocytes Relative 07/18/2015 6   Final  . Monocytes Absolute 07/18/2015 0.3  0.2 - 0.9 K/uL Final  . Eosinophils Relative 07/18/2015 9   Final  . Eosinophils Absolute 07/18/2015 0.4  0 - 0.7 K/uL Final  . Basophils Relative 07/18/2015 1   Final  . Basophils Absolute 07/18/2015 0.0  0 - 0.1 K/uL Final  . Sodium 07/18/2015 139  135 - 145 mmol/L Final  . Potassium 07/18/2015 3.8  3.5 - 5.1 mmol/L Final  . Chloride 07/18/2015 99* 101 - 111 mmol/L Final  . CO2 07/18/2015 30  22 - 32 mmol/L Final  . Glucose, Bld 07/18/2015 117* 65 - 99 mg/dL Final  . BUN 07/18/2015 15  6 - 20 mg/dL Final  . Creatinine, Ser 07/18/2015 0.82  0.44 - 1.00 mg/dL Final  . Calcium 07/18/2015 9.1  8.9 - 10.3 mg/dL Final  . Total Protein 07/18/2015 6.9  6.5 - 8.1 g/dL Final  . Albumin 07/18/2015 4.4  3.5 - 5.0 g/dL Final  . AST 07/18/2015 21  15 - 41 U/L Final  . ALT 07/18/2015 19  14 - 54 U/L Final  . Alkaline Phosphatase 07/18/2015 64  38 - 126 U/L Final  . Total Bilirubin 07/18/2015 0.5  0.3 - 1.2 mg/dL Final  . GFR calc non Af Amer 07/18/2015 >  60  >60 mL/min Final  . GFR calc Af Amer 07/18/2015 >60  >60 mL/min Final   Comment: (NOTE) The eGFR has been calculated using the CKD EPI equation. This calculation has not been validated in all clinical situations. eGFR's persistently <60 mL/min signify possible Chronic Kidney Disease.   . Anion gap 07/18/2015 10  5 - 15 Final  . Magnesium 07/18/2015 1.9  1.7 - 2.4 mg/dL Final     ASSESSMENT:  26  -year-old lady with a history of carcinoma breast.   T1c N1 C micro-M0   continue Herceptin today.  Patient would be given now 2 months off because of drop  In ejection fraction as well as   hip replacement has been planned.  We will get another MUGA scan of the heart in 6 weeks after surgery and if there is improvement   In  Ejection fraction thenwill continue Herceptin for a total duration of one year. [name], MD   07/18/2015 12:33 PM

## 2015-07-19 ENCOUNTER — Ambulatory Visit
Admission: RE | Admit: 2015-07-19 | Discharge: 2015-07-19 | Disposition: A | Discharge status: Home / Self Care | Payer: Medicare Other | Source: Ambulatory Visit | Attending: Radiation Oncology | Admitting: Radiation Oncology

## 2015-07-19 ENCOUNTER — Ambulatory Visit: Payer: Medicare Other

## 2015-07-19 DIAGNOSIS — Z510 Encounter for antineoplastic radiation therapy: Secondary | ICD-10-CM | POA: Diagnosis not present

## 2015-07-20 ENCOUNTER — Ambulatory Visit: Payer: Medicare Other

## 2015-07-20 ENCOUNTER — Ambulatory Visit
Admission: RE | Admit: 2015-07-20 | Discharge: 2015-07-20 | Disposition: A | Discharge status: Home / Self Care | Payer: Medicare Other | Source: Ambulatory Visit | Attending: Radiation Oncology | Admitting: Radiation Oncology

## 2015-07-20 DIAGNOSIS — Z510 Encounter for antineoplastic radiation therapy: Secondary | ICD-10-CM | POA: Diagnosis not present

## 2015-07-23 ENCOUNTER — Ambulatory Visit: Payer: Medicare Other

## 2015-07-24 ENCOUNTER — Ambulatory Visit
Admission: RE | Admit: 2015-07-24 | Discharge: 2015-07-24 | Disposition: A | Discharge status: Home / Self Care | Payer: Medicare Other | Source: Ambulatory Visit | Attending: Radiation Oncology | Admitting: Radiation Oncology

## 2015-07-24 DIAGNOSIS — Z510 Encounter for antineoplastic radiation therapy: Secondary | ICD-10-CM | POA: Diagnosis not present

## 2015-07-25 ENCOUNTER — Ambulatory Visit
Admission: RE | Admit: 2015-07-25 | Discharge: 2015-07-25 | Disposition: A | Discharge status: Home / Self Care | Payer: Medicare Other | Source: Ambulatory Visit | Attending: Radiation Oncology | Admitting: Radiation Oncology

## 2015-07-25 ENCOUNTER — Ambulatory Visit: Payer: Medicare Other

## 2015-07-25 DIAGNOSIS — Z510 Encounter for antineoplastic radiation therapy: Secondary | ICD-10-CM | POA: Diagnosis not present

## 2015-07-26 ENCOUNTER — Ambulatory Visit
Admission: RE | Admit: 2015-07-26 | Discharge: 2015-07-26 | Disposition: A | Discharge status: Home / Self Care | Payer: Medicare Other | Source: Ambulatory Visit | Attending: Radiation Oncology | Admitting: Radiation Oncology

## 2015-07-26 ENCOUNTER — Ambulatory Visit: Payer: Medicare Other

## 2015-07-26 DIAGNOSIS — Z510 Encounter for antineoplastic radiation therapy: Secondary | ICD-10-CM | POA: Diagnosis not present

## 2015-07-27 ENCOUNTER — Ambulatory Visit
Admission: RE | Admit: 2015-07-27 | Discharge: 2015-07-27 | Disposition: A | Discharge status: Home / Self Care | Payer: Medicare Other | Source: Ambulatory Visit | Attending: Radiation Oncology | Admitting: Radiation Oncology

## 2015-07-27 DIAGNOSIS — Z510 Encounter for antineoplastic radiation therapy: Secondary | ICD-10-CM | POA: Diagnosis not present

## 2015-08-18 NOTE — H&P (Signed)
TOTAL HIP ADMISSION H&P  Patient is admitted for right total hip arthroplasty, anterior approach.  Subjective:  Chief Complaint:    Right hip primary OA / pain  HPI: [name], 75 y.o. female, has a history of pain and functional disability in the right hip(s) due to arthritis and patient has failed non-surgical conservative treatments for greater than 12 weeks to include NSAID's and/or analgesics, corticosteriod injections and activity modification.  Onset of symptoms was gradual starting 5+ years ago with gradually worsening course since that time.The patient noted no past surgery on the right hip(s).  Patient currently rates pain in the right hip at 8 out of 10 with activity. Patient has night pain, worsening of pain with activity and weight bearing, trendelenberg gait, pain that interfers with activities of daily living and pain with passive range of motion. Patient has evidence of periarticular osteophytes and joint space narrowing by imaging studies. This condition presents safety issues increasing the risk of falls.  There is no current active infection.   Risks, benefits and expectations were discussed with the patient.  Risks including but not limited to the risk of anesthesia, blood clots, nerve damage, blood vessel damage, failure of the prosthesis, infection and up to and including death.  Patient understand the risks, benefits and expectations and wishes to proceed with surgery.   PCP: [name], MD  D/C Plans:      Home  Post-op Meds:       No Rx given   Tranexamic Acid:      To be given - IV  Decadron:      Is to be given  FYI:     ASA  Norco    Patient Active Problem List   Diagnosis Date Noted  . Malignant neoplasm of female breast ([place]) 07/18/2015  . 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 Breast cancer metastasized to axillary lymph node ([place]) 01/24/2015  . DDD (degenerative disc disease), lumbar 01/02/2015  . HLD (hyperlipidemia) 01/02/2015  . Benign  neoplasm of meninges ([place]) 01/02/2015  . Arthritis, degenerative 01/02/2015  . Psoriasis 01/02/2015  . Rotator cuff arthropathy 09/15/2012   Past Medical History  Diagnosis Date  . 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 Hearing loss   . Memory impairment     seen by Dr Manuella Ghazi  possible post crainiotomy from radiation  . 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    Past Surgical History  Procedure Laterality Date  . Appendectomy  1950  . Brain surgery  1995    left frontal/temporal  . Colonoscopy  2010    Dr. [name]  . Breast biopsy Left 1997  . 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 Breast biopsy Left 01/08/2015    Procedure: BREAST BIOPSY WITH NEEDLE LOCALIZATION;  Surgeon: [name], MD;  Location: [facility] ORS;  Service: General;  Laterality: Left;  . Breast lumpectomy Left 01/08/2015    Procedure: LUMPECTOMY;  Surgeon: [name], MD;  Location: [facility] ORS;  Service: General;  Laterality: Left;  . Sentinel node biopsy Left 01/16/2015    Procedure: SENTINEL NODE BIOPSY;  Surgeon: [name], MD;  Location: [facility] ORS;  Service: General;  Laterality: Left;  . Portacath placement Right 01/16/2015    Procedure: INSERTION PORT-A-CATH;  Surgeon: [name], MD;  Location:  [facility] ORS;  Service: General;  Laterality: Right;    No prescriptions prior to admission   No Known Allergies   Social History  Substance Use Topics  . Smoking status: Former Smoker    Quit date: 12/28/1964  . Smokeless tobacco: Never Used  . Alcohol Use: 0.6 - 1.2 oz/week    1-2 Glasses of wine per week     Comment: 1 Glass Wine / Night    Family History  Problem Relation Age of Onset  . Breast cancer Sister 66  . Addison's disease Mother   . Stroke Father      Review of Systems  Constitutional: Positive for malaise/fatigue.  HENT: Positive for  hearing loss.   Eyes: Negative.   Respiratory: Negative.   Cardiovascular: Negative.   Gastrointestinal: Positive for constipation.  Genitourinary: Negative.   Musculoskeletal: Positive for back pain and joint pain.  Skin: Negative.   Neurological: Positive for weakness.  Endo/Heme/Allergies: Negative.   Psychiatric/Behavioral: Negative.     Objective:  Physical Exam  Constitutional: She is oriented to person, place, and time. She appears well-developed.  HENT:  Head: Normocephalic.  Eyes: Pupils are equal, round, and reactive to light.  Neck: Neck supple. No JVD present. No tracheal deviation present. No thyromegaly present.  Cardiovascular: Normal rate, regular rhythm, normal heart sounds and intact distal pulses.   Respiratory: Effort normal and breath sounds normal. No stridor. No respiratory distress. She has no wheezes.  GI: Soft. There is no tenderness. There is no guarding.  Musculoskeletal:       Right hip: She exhibits decreased range of motion, decreased strength, tenderness and bony tenderness. She exhibits no swelling, no deformity and no laceration.  Lymphadenopathy:    She has no cervical adenopathy.  Neurological: She is alert and oriented to person, place, and time.  Skin: Skin is warm and dry.  Psychiatric: She has a normal mood and affect.       Labs:  Estimated body mass index is 25.08 kg/(m^2) as calculated from the following:   Height as of 05/16/15: 5' 4"  (1.626 m).   Weight as of 07/18/15: 66.3 kg (146 lb 2.6 oz).   Imaging Review Plain radiographs demonstrate severe degenerative joint disease of the right hip(s). The bone quality appears to be good for age and reported activity level.  Assessment/Plan:  End stage arthritis, right hip(s)  The patient history, physical examination, clinical judgement of the provider and imaging studies are consistent with end stage degenerative joint disease of the right hip(s) and total hip arthroplasty is  deemed medically necessary. The treatment options including medical management, injection therapy, arthroscopy and arthroplasty were discussed at length. The risks and benefits of total hip arthroplasty were presented and reviewed. The risks due to aseptic loosening, infection, stiffness, dislocation/subluxation,  thromboembolic complications and other imponderables were discussed.  The patient acknowledged the explanation, agreed to proceed with the plan and consent was signed. Patient is being admitted for inpatient treatment for surgery, pain control, PT, OT, prophylactic antibiotics, VTE prophylaxis, progressive ambulation and ADL's and discharge planning.The patient is planning to be discharged home.     [place] Pugh Naomy Esham   PA-C  08/18/2015, 1:24 PM

## 2015-08-28 ENCOUNTER — Encounter (HOSPITAL_COMMUNITY)
Admission: RE | Admit: 2015-08-28 | Discharge: 2015-08-28 | Disposition: A | Discharge status: Home / Self Care | Payer: Medicare Other | Source: Ambulatory Visit | Attending: Orthopedic Surgery | Admitting: Orthopedic Surgery

## 2015-08-28 ENCOUNTER — Ambulatory Visit (HOSPITAL_COMMUNITY)
Admission: RE | Admit: 2015-08-28 | Discharge: 2015-08-28 | Disposition: A | Discharge status: Home / Self Care | Payer: Medicare Other | Source: Ambulatory Visit | Attending: Anesthesiology | Admitting: Anesthesiology

## 2015-08-28 ENCOUNTER — Telehealth: Payer: Self-pay | Admitting: *Deleted

## 2015-08-28 ENCOUNTER — Encounter (HOSPITAL_COMMUNITY): Payer: Self-pay

## 2015-08-28 DIAGNOSIS — C773 Secondary and unspecified malignant neoplasm of axilla and upper limb lymph nodes: Principal | ICD-10-CM

## 2015-08-28 DIAGNOSIS — Z01818 Encounter for other preprocedural examination: Secondary | ICD-10-CM | POA: Insufficient documentation

## 2015-08-28 DIAGNOSIS — R938 Abnormal findings on diagnostic imaging of other specified body structures: Secondary | ICD-10-CM | POA: Insufficient documentation

## 2015-08-28 DIAGNOSIS — R918 Other nonspecific abnormal finding of lung field: Secondary | ICD-10-CM | POA: Diagnosis not present

## 2015-08-28 DIAGNOSIS — Z853 Personal history of malignant neoplasm of breast: Secondary | ICD-10-CM | POA: Insufficient documentation

## 2015-08-28 DIAGNOSIS — R9389 Abnormal findings on diagnostic imaging of other specified body structures: Secondary | ICD-10-CM

## 2015-08-28 DIAGNOSIS — C50912 Malignant neoplasm of unspecified site of left female breast: Secondary | ICD-10-CM

## 2015-08-28 HISTORY — DX: Other abnormalities of gait and mobility: R26.89

## 2015-08-28 HISTORY — DX: Unspecified cataract: H26.9

## 2015-08-28 HISTORY — DX: Anesthesia of skin: R20.0

## 2015-08-28 HISTORY — DX: Presence of spectacles and contact lenses: Z97.3

## 2015-08-28 HISTORY — DX: Unspecified hearing loss, unspecified ear: H91.90

## 2015-08-28 HISTORY — DX: Personal history of antineoplastic chemotherapy: Z92.21

## 2015-08-28 HISTORY — DX: Paresthesia of skin: R20.2

## 2015-08-28 HISTORY — DX: Personal history of irradiation: Z92.3

## 2015-08-28 HISTORY — DX: Unspecified osteoarthritis, unspecified site: M19.90

## 2015-08-28 LAB — BASIC METABOLIC PANEL
ANION GAP: 9 (ref 5–15)
BUN: 15 mg/dL (ref 6–20)
CALCIUM: 9.4 mg/dL (ref 8.9–10.3)
CO2: 30 mmol/L (ref 22–32)
CREATININE: 0.76 mg/dL (ref 0.44–1.00)
Chloride: 101 mmol/L (ref 101–111)
GLUCOSE: 90 mg/dL (ref 65–99)
Potassium: 4.8 mmol/L (ref 3.5–5.1)
Sodium: 140 mmol/L (ref 135–145)

## 2015-08-28 LAB — URINALYSIS, ROUTINE W REFLEX MICROSCOPIC
Bilirubin Urine: NEGATIVE
GLUCOSE, UA: NEGATIVE mg/dL
Hgb urine dipstick: NEGATIVE
KETONES UR: NEGATIVE mg/dL
LEUKOCYTES UA: NEGATIVE
NITRITE: NEGATIVE
PH: 7 (ref 5.0–8.0)
Protein, ur: NEGATIVE mg/dL
SPECIFIC GRAVITY, URINE: 1.015 (ref 1.005–1.030)

## 2015-08-28 LAB — CBC
HCT: 40.1 % (ref 36.0–46.0)
HEMOGLOBIN: 12.9 g/dL (ref 12.0–15.0)
MCH: 30.9 pg (ref 26.0–34.0)
MCHC: 32.2 g/dL (ref 30.0–36.0)
MCV: 96.2 fL (ref 78.0–100.0)
PLATELETS: 308 10*3/uL (ref 150–400)
RBC: 4.17 MIL/uL (ref 3.87–5.11)
RDW: 13.1 % (ref 11.5–15.5)
WBC: 5.5 10*3/uL (ref 4.0–10.5)

## 2015-08-28 LAB — PROTIME-INR
INR: 1.04 (ref 0.00–1.49)
PROTHROMBIN TIME: 13.8 s (ref 11.6–15.2)

## 2015-08-28 LAB — SURGICAL PCR SCREEN
MRSA, PCR: NEGATIVE
STAPHYLOCOCCUS AUREUS: NEGATIVE

## 2015-08-28 LAB — ABO/RH: ABO/RH(D): A POS

## 2015-08-28 LAB — APTT: APTT: 32 s (ref 24–37)

## 2015-08-28 MED ORDER — GABAPENTIN 300 MG PO CAPS: ORAL_CAPSULE | ORAL | Status: DC

## 2015-08-28 NOTE — Telephone Encounter (Signed)
E scribed pre VO Dr Oliva Bustard

## 2015-08-28 NOTE — Patient Instructions (Signed)
[name]  08/28/2015   Your procedure is scheduled on: Tuesday September 04, 2015   Report to [facility] Main  Entrance take [place]  elevators to 3rd floor to  [place] at 7:00 AM.  Call this number if you have problems the morning of surgery [phone]   Remember: ONLY 1 PERSON MAY GO WITH YOU TO SHORT STAY TO GET  READY MORNING OF [place].  Do not eat food or drink liquids :After Midnight.     Take these medicines the morning of surgery with A SIP OF WATER: Gabapentin (Neurontin); Loratadine (Claritin) if needed; May use eye drops if needed; May take hydrocodone-acetaminophen if needed                                You may not have any metal on your body including hair pins and              piercings  Do not wear jewelry, make-up, lotions, powders or perfumes, deodorant             Do not wear nail polish.  Do not shave  48 hours prior to surgery.              Do not bring valuables to the hospital. [place].  Contacts, dentures or bridgework may not be worn into surgery.  Leave suitcase in the car. After surgery it may be brought to your room.                Please read over the following fact sheets you were given:MRSA INFORMATION SHEET; INCENTIVE SPIROMETER; BLOOD TRANSFUSION INFORMATION SHEET  _____________________________________________________________________             [facility] - Preparing for Surgery Before surgery, you can play an important role.  Because skin is not sterile, your skin needs to be as free of germs as possible.  You can reduce the number of germs on your skin by washing with CHG (chlorahexidine gluconate) soap before surgery.  CHG is an antiseptic cleaner which kills germs and bonds with the skin to continue killing germs even after washing. Please DO NOT use if you have an allergy to CHG or antibacterial soaps.  If your skin becomes reddened/irritated stop  using the CHG and inform your nurse when you arrive at Short Stay. Do not shave (including legs and underarms) for at least 48 hours prior to the first CHG shower.  You may shave your face/neck. Please follow these instructions carefully:  1.  Shower with CHG Soap the night before surgery and the  morning of Surgery.  2.  If you choose to wash your hair, wash your hair first as usual with your  normal  shampoo.  3.  After you shampoo, rinse your hair and body thoroughly to remove the  shampoo.                           4.  Use CHG as you would any other liquid soap.  You can apply chg directly  to the skin and wash  Gently with a scrungie or clean washcloth.  5.  Apply the CHG Soap to your body ONLY FROM THE NECK DOWN.   Do not use on face/ open                           Wound or open sores. Avoid contact with eyes, ears mouth and genitals (private parts).                       Wash face,  Genitals (private parts) with your normal soap.             6.  Wash thoroughly, paying special attention to the area where your surgery  will be performed.  7.  Thoroughly rinse your body with warm water from the neck down.  8.  DO NOT shower/wash with your normal soap after using and rinsing off  the CHG Soap.                9.  Pat yourself dry with a clean towel.            10.  Wear clean pajamas.            11.  Place clean sheets on your bed the night of your first shower and do not  sleep with pets. Day of Surgery : Do not apply any lotions/deodorants the morning of surgery.  Please wear clean clothes to the hospital/surgery center.  FAILURE TO FOLLOW THESE INSTRUCTIONS MAY RESULT IN THE CANCELLATION OF YOUR SURGERY PATIENT SIGNATURE_________________________________  NURSE SIGNATURE__________________________________  ________________________________________________________________________   Adam Phenix  An incentive spirometer is a tool that can help keep your  lungs clear and active. This tool measures how well you are filling your lungs with each breath. Taking long deep breaths may help reverse or decrease the chance of developing breathing (pulmonary) problems (especially infection) following:  A long period of time when you are unable to move or be active. BEFORE THE PROCEDURE   If the spirometer includes an indicator to show your best effort, your nurse or respiratory therapist will set it to a desired goal.  If possible, sit up straight or lean slightly forward. Try not to slouch.  Hold the incentive spirometer in an upright position. INSTRUCTIONS FOR USE   Sit on the edge of your bed if possible, or sit up as far as you can in bed or on a chair.  Hold the incentive spirometer in an upright position.  Breathe out normally.  Place the mouthpiece in your mouth and seal your lips tightly around it.  Breathe in slowly and as deeply as possible, raising the piston or the ball toward the top of the column.  Hold your breath for 3-5 seconds or for as long as possible. Allow the piston or ball to fall to the bottom of the column.  Remove the mouthpiece from your mouth and breathe out normally.  Rest for a few seconds and repeat Steps 1 through 7 at least 10 times every 1-2 hours when you are awake. Take your time and take a few normal breaths between deep breaths.  The spirometer may include an indicator to show your best effort. Use the indicator as a goal to work toward during each repetition.  After each set of 10 deep breaths, practice coughing to be sure your lungs are clear. If you have an incision (the cut made at the time of surgery),  support your incision when coughing by placing a pillow or rolled up towels firmly against it. Once you are able to get out of bed, walk around indoors and cough well. You may stop using the incentive spirometer when instructed by your caregiver.  RISKS AND COMPLICATIONS  Take your time so you do not  get dizzy or light-headed.  If you are in pain, you may need to take or ask for pain medication before doing incentive spirometry. It is harder to take a deep breath if you are having pain. AFTER USE  Rest and breathe slowly and easily.  It can be helpful to keep track of a log of your progress. Your caregiver can provide you with a simple table to help with this. If you are using the spirometer at home, follow these instructions: [place] IF:   You are having difficultly using the spirometer.  You have trouble using the spirometer as often as instructed.  Your pain medication is not giving enough relief while using the spirometer.  You develop fever of 100.5 F (38.1 C) or higher. SEEK IMMEDIATE MEDICAL CARE IF:   You cough up bloody sputum that had not been present before.  You develop fever of 102 F (38.9 C) or greater.  You develop worsening pain at or near the incision site. MAKE SURE YOU:   Understand these instructions.  Will watch your condition.  Will get help right away if you are not doing well or get worse. Document Released: 11/24/2006 Document Revised: 10/06/2011 Document Reviewed: 01/25/2007 ExitCare Patient Information 2014 ExitCare, [place].   ________________________________________________________________________  WHAT IS A BLOOD TRANSFUSION? Blood Transfusion Information  A transfusion is the replacement of blood or some of its parts. Blood is made up of multiple cells which provide different functions.  Red blood cells carry oxygen and are used for blood loss replacement.  White blood cells fight against infection.  Platelets control bleeding.  Plasma helps clot blood.  Other blood products are available for specialized needs, such as hemophilia or other clotting disorders. BEFORE THE TRANSFUSION  Who gives blood for transfusions?   Healthy volunteers who are fully evaluated to make sure their blood is safe. This is blood bank  blood. Transfusion therapy is the safest it has ever been in the practice of medicine. Before blood is taken from a donor, a complete history is taken to make sure that person has no history of diseases nor engages in risky social behavior (examples are intravenous drug use or sexual activity with multiple partners). The donor's travel history is screened to minimize risk of transmitting infections, such as malaria. The donated blood is tested for signs of infectious diseases, such as HIV and hepatitis. The blood is then tested to be sure it is compatible with you in order to minimize the chance of a transfusion reaction. If you or a relative donates blood, this is often done in anticipation of surgery and is not appropriate for emergency situations. It takes many days to process the donated blood. RISKS AND COMPLICATIONS Although transfusion therapy is very safe and saves many lives, the main dangers of transfusion include:   Getting an infectious disease.  Developing a transfusion reaction. This is an allergic reaction to something in the blood you were given. Every precaution is taken to prevent this. The decision to have a blood transfusion has been considered carefully by your caregiver before blood is given. Blood is not given unless the benefits outweigh the risks. AFTER THE TRANSFUSION  Right after receiving a blood transfusion, you will usually feel much better and more energetic. This is especially true if your red blood cells have gotten low (anemic). The transfusion raises the level of the red blood cells which carry oxygen, and this usually causes an energy increase.  The nurse administering the transfusion will monitor you carefully for complications. HOME CARE INSTRUCTIONS  No special instructions are needed after a transfusion. You may find your energy is better. Speak with your caregiver about any limitations on activity for underlying diseases you may have. SEEK MEDICAL CARE IF:    Your condition is not improving after your transfusion.  You develop redness or irritation at the intravenous (IV) site. SEEK IMMEDIATE MEDICAL CARE IF:  Any of the following symptoms occur over the next 12 hours:  Shaking chills.  You have a temperature by mouth above 102 F (38.9 C), not controlled by medicine.  Chest, back, or muscle pain.  People around you feel you are not acting correctly or are confused.  Shortness of breath or difficulty breathing.  Dizziness and fainting.  You get a rash or develop hives.  You have a decrease in urine output.  Your urine turns a dark color or changes to pink, red, or brown. Any of the following symptoms occur over the next 10 days:  You have a temperature by mouth above 102 F (38.9 C), not controlled by medicine.  Shortness of breath.  Weakness after normal activity.  The white part of the eye turns yellow (jaundice).  You have a decrease in the amount of urine or are urinating less often.  Your urine turns a dark color or changes to pink, red, or brown. Document Released: 07/11/2000 Document Revised: 10/06/2011 Document Reviewed: 02/28/2008 [facility] Patient Information 2014 [place], [place].  _______________________________________________________________________

## 2015-08-28 NOTE — Progress Notes (Addendum)
EKG/epic 01/04/2015  Cardiac MUGA/epic 01/24/2015 and 05/30/2015 Clearance note per chart per Dr Caryl Comes 08/16/2015  H&P per Dr Bosie Helper on chart 08/16/2015

## 2015-08-29 NOTE — Progress Notes (Signed)
Dr Ewell/anesthesia reviewed H&P, cardiac MUGA/epic 05/30/2015, and CXR results / epic per PAT visit 08/28/2015. No orders given. Anesthesia to see pt day of surgery.

## 2015-08-30 DIAGNOSIS — G301 Alzheimer's disease with late onset: Secondary | ICD-10-CM

## 2015-08-30 DIAGNOSIS — F0280 Dementia in other diseases classified elsewhere without behavioral disturbance: Secondary | ICD-10-CM | POA: Insufficient documentation

## 2015-08-31 ENCOUNTER — Encounter: Payer: Self-pay | Admitting: Radiation Oncology

## 2015-08-31 ENCOUNTER — Ambulatory Visit
Admission: RE | Admit: 2015-08-31 | Discharge: 2015-08-31 | Disposition: A | Discharge status: Home / Self Care | Payer: Medicare Other | Source: Ambulatory Visit | Attending: Radiation Oncology | Admitting: Radiation Oncology

## 2015-08-31 ENCOUNTER — Ambulatory Visit: Payer: Medicare Other | Admitting: Radiation Oncology

## 2015-08-31 VITALS — BP 134/74 | HR 84 | Temp 97.2°F | Wt 145.3 lb

## 2015-08-31 DIAGNOSIS — C773 Secondary and unspecified malignant neoplasm of axilla and upper limb lymph nodes: Principal | ICD-10-CM

## 2015-08-31 DIAGNOSIS — C50912 Malignant neoplasm of unspecified site of left female breast: Secondary | ICD-10-CM

## 2015-08-31 NOTE — Progress Notes (Signed)
Radiation Oncology Follow up Note  Name: [name]   Date:   08/31/2015 MRN:  [id number] DOB: 24-Feb-1941    This 75 y.o. female presents to the clinic today for follow-up for stage I ER/[name] HER-2/neu overexpressed invasive mammary carcinoma of the left breast status post whole breast and peripheral lymphatic radiation 1 month out.  REFERRING PROVIDER: [name], MD  HPI: Patient is a 75 year old femaleNnow 1 month out completing whole breast and peripheral lymphatic radiation therapy to her left breast for a T1c M1 MIC. Invasive mammary carcinoma ER/[name] HER-2/neu overexpressed status post [facility] chemotherapy and whole breast and peripheral lymphatic radiation. She is seen today one month out is doing well from a breast standpoint she scheduled next week for hip replacement surgery. She specifically denies breast tenderness cough or bone pain is having some slight tenderness in her left axilla which is is not uncommon secondary to her sentinel lymph node biopsy.  COMPLICATIONS OF TREATMENT: none  FOLLOW UP COMPLIANCE: keeps appointments   PHYSICAL EXAM:  BP 134/74 mmHg  Pulse 84  Temp(Src) 97.2 F (36.2 C)  Wt 145 lb 4.5 oz (65.9 kg) Lungs are clear to A&P cardiac examination essentially unremarkable with regular rate and rhythm. No dominant mass or nodularity is noted in either breast in 2 positions examined. Incision is well-healed. No axillary or supraclavicular adenopathy is appreciated. Cosmetic result is excellent. Still has Port-A-Cath placed in the right anterior chest wall. Well-developed well-nourished patient in NAD. HEENT reveals PERLA, EOMI, discs not visualized.  Oral cavity is clear. No oral mucosal lesions are identified. Neck is clear without evidence of cervical or supraclavicular adenopathy. Lungs are clear to A&P. Cardiac examination is essentially unremarkable with regular rate and rhythm without murmur rub or thrill. Abdomen is benign with no  organomegaly or masses noted. Motor sensory and DTR levels are equal and symmetric in the upper and lower extremities. Cranial nerves II through XII are grossly intact. Proprioception is intact. No peripheral adenopathy or edema is identified. No motor or sensory levels are noted. Crude visual fields are within normal range.  RADIOLOGY RESULTS: No current films for review  PLAN: At the present time she is on hold from her Herceptin for her hip replacement surgery. I wished well in that for next week. Otherwise I'm please were overall progress. I have asked to see her back in 4-5 months for follow-up. Based on her negative ER/PR status she is not on aromatase inhibitor therapy. Patient knows to call with any concerns.  I would like to take this opportunity for allowing me to participate in the care of your patient.Armstead Peaks., MD

## 2015-09-04 ENCOUNTER — Inpatient Hospital Stay (HOSPITAL_COMMUNITY): Payer: Medicare Other

## 2015-09-04 ENCOUNTER — Inpatient Hospital Stay (HOSPITAL_COMMUNITY): Payer: Medicare Other | Admitting: Anesthesiology

## 2015-09-04 ENCOUNTER — Encounter (HOSPITAL_COMMUNITY)
Admission: RE | Disposition: A | Discharge status: Home Health | Payer: Self-pay | Source: Ambulatory Visit | Attending: Orthopedic Surgery

## 2015-09-04 ENCOUNTER — Encounter: Payer: Self-pay | Admitting: *Deleted

## 2015-09-04 ENCOUNTER — Encounter (HOSPITAL_COMMUNITY): Payer: Self-pay | Admitting: *Deleted

## 2015-09-04 ENCOUNTER — Inpatient Hospital Stay (HOSPITAL_COMMUNITY)
Admission: RE | Admit: 2015-09-04 | Discharge: 2015-09-05 | DRG: 470 | Disposition: A | Discharge status: Home Health | Payer: Medicare Other | Source: Ambulatory Visit | Attending: Orthopedic Surgery | Admitting: Orthopedic Surgery

## 2015-09-04 DIAGNOSIS — M1611 Unilateral primary osteoarthritis, right hip: Principal | ICD-10-CM | POA: Diagnosis present

## 2015-09-04 DIAGNOSIS — Z87891 Personal history of nicotine dependence: Secondary | ICD-10-CM | POA: Diagnosis not present

## 2015-09-04 DIAGNOSIS — H9190 Unspecified hearing loss, unspecified ear: Secondary | ICD-10-CM | POA: Diagnosis present

## 2015-09-04 DIAGNOSIS — Z9660 Presence of unspecified orthopedic joint implant: Secondary | ICD-10-CM | POA: Insufficient documentation

## 2015-09-04 DIAGNOSIS — Z853 Personal history of malignant neoplasm of breast: Secondary | ICD-10-CM

## 2015-09-04 DIAGNOSIS — Z96649 Presence of unspecified artificial hip joint: Secondary | ICD-10-CM

## 2015-09-04 DIAGNOSIS — M25551 Pain in right hip: Secondary | ICD-10-CM | POA: Diagnosis present

## 2015-09-04 HISTORY — DX: Presence of unspecified artificial hip joint: Z96.649

## 2015-09-04 HISTORY — PX: TOTAL HIP ARTHROPLASTY: SHX124

## 2015-09-04 HISTORY — DX: Presence of unspecified orthopedic joint implant: Z96.60

## 2015-09-04 LAB — TYPE AND SCREEN
ABO/RH(D): A POS
Antibody Screen: NEGATIVE

## 2015-09-04 SURGERY — ARTHROPLASTY, HIP, TOTAL, ANTERIOR APPROACH
Anesthesia: Spinal | Site: Hip | Laterality: Right

## 2015-09-04 MED ORDER — METOCLOPRAMIDE HCL 10 MG PO TABS: 5.0000 mg | ORAL_TABLET | Freq: Three times a day (TID) | ORAL | Status: DC | PRN

## 2015-09-04 MED ORDER — HYDROCODONE-ACETAMINOPHEN 7.5-325 MG PO TABS
1.0000 | ORAL_TABLET | ORAL | Status: DC
  Administered 2015-09-04: 2 via ORAL
  Administered 2015-09-04: 1 via ORAL
  Administered 2015-09-04: 2 via ORAL
  Administered 2015-09-05: 1 via ORAL
  Administered 2015-09-05: 2 via ORAL
  Filled 2015-09-04 (×3): qty 2
  Filled 2015-09-04 (×2): qty 1

## 2015-09-04 MED ORDER — CEFAZOLIN SODIUM-DEXTROSE 2-3 GM-% IV SOLR
2.0000 g | Freq: Four times a day (QID) | INTRAVENOUS | Status: AC
  Administered 2015-09-04 (×2): 2 g via INTRAVENOUS
  Filled 2015-09-04 (×2): qty 50

## 2015-09-04 MED ORDER — ASPIRIN EC 325 MG PO TBEC
325.0000 mg | DELAYED_RELEASE_TABLET | Freq: Two times a day (BID) | ORAL | Status: DC
  Administered 2015-09-05: 325 mg via ORAL
  Filled 2015-09-04 (×3): qty 1

## 2015-09-04 MED ORDER — CEFAZOLIN SODIUM-DEXTROSE 2-3 GM-% IV SOLR
2.0000 g | INTRAVENOUS | Status: AC
  Administered 2015-09-04: 2 g via INTRAVENOUS

## 2015-09-04 MED ORDER — MAGNESIUM CITRATE PO SOLN: 1.0000 | Freq: Once | ORAL | Status: DC | PRN

## 2015-09-04 MED ORDER — DEXAMETHASONE SODIUM PHOSPHATE 10 MG/ML IJ SOLN
INTRAMUSCULAR | Status: AC
  Filled 2015-09-04: qty 1

## 2015-09-04 MED ORDER — METHOCARBAMOL 500 MG PO TABS
500.0000 mg | ORAL_TABLET | Freq: Four times a day (QID) | ORAL | Status: DC | PRN
  Administered 2015-09-05: 500 mg via ORAL
  Filled 2015-09-04: qty 1

## 2015-09-04 MED ORDER — LORATADINE 10 MG PO TABS
10.0000 mg | ORAL_TABLET | Freq: Every day | ORAL | Status: DC
  Administered 2015-09-04 – 2015-09-05 (×2): 10 mg via ORAL
  Filled 2015-09-04 (×2): qty 1

## 2015-09-04 MED ORDER — ALUM & MAG HYDROXIDE-SIMETH 200-200-20 MG/5ML PO SUSP: 30.0000 mL | ORAL | Status: DC | PRN

## 2015-09-04 MED ORDER — PROPOFOL 10 MG/ML IV BOLUS
INTRAVENOUS | Status: AC
  Filled 2015-09-04: qty 20

## 2015-09-04 MED ORDER — HYDROMORPHONE HCL 1 MG/ML IJ SOLN: 0.2500 mg | INTRAMUSCULAR | Status: DC | PRN

## 2015-09-04 MED ORDER — SODIUM CHLORIDE 0.9 % IJ SOLN
INTRAMUSCULAR | Status: AC
  Filled 2015-09-04: qty 10

## 2015-09-04 MED ORDER — DIPHENHYDRAMINE HCL 25 MG PO CAPS: 25.0000 mg | ORAL_CAPSULE | Freq: Four times a day (QID) | ORAL | Status: DC | PRN

## 2015-09-04 MED ORDER — DEXTROSE 5 % IV SOLN
500.0000 mg | Freq: Four times a day (QID) | INTRAVENOUS | Status: DC | PRN
  Filled 2015-09-04: qty 5

## 2015-09-04 MED ORDER — LACTATED RINGERS IV SOLN
INTRAVENOUS | Status: DC
  Administered 2015-09-04: 11:00:00 via INTRAVENOUS
  Administered 2015-09-04 (×2): 1000 mL via INTRAVENOUS

## 2015-09-04 MED ORDER — PROPOFOL 500 MG/50ML IV EMUL
INTRAVENOUS | Status: DC | PRN
  Administered 2015-09-04: 50 ug/kg/min via INTRAVENOUS

## 2015-09-04 MED ORDER — ONDANSETRON HCL 4 MG/2ML IJ SOLN
INTRAMUSCULAR | Status: DC | PRN
  Administered 2015-09-04: 4 mg via INTRAVENOUS

## 2015-09-04 MED ORDER — DOCUSATE SODIUM 100 MG PO CAPS
100.0000 mg | ORAL_CAPSULE | Freq: Two times a day (BID) | ORAL | Status: DC
  Administered 2015-09-04 – 2015-09-05 (×2): 100 mg via ORAL

## 2015-09-04 MED ORDER — PHENOL 1.4 % MT LIQD
1.0000 | OROMUCOSAL | Status: DC | PRN
Start: 2015-09-04 — End: 2015-09-05
  Filled 2015-09-04: qty 177

## 2015-09-04 MED ORDER — ONDANSETRON HCL 4 MG/2ML IJ SOLN: 4.0000 mg | Freq: Four times a day (QID) | INTRAMUSCULAR | Status: DC | PRN

## 2015-09-04 MED ORDER — PROPOFOL 10 MG/ML IV BOLUS
INTRAVENOUS | Status: DC | PRN
  Administered 2015-09-04: 20 mg via INTRAVENOUS

## 2015-09-04 MED ORDER — METOCLOPRAMIDE HCL 5 MG/ML IJ SOLN: 5.0000 mg | Freq: Three times a day (TID) | INTRAMUSCULAR | Status: DC | PRN

## 2015-09-04 MED ORDER — CEFAZOLIN SODIUM-DEXTROSE 2-3 GM-% IV SOLR
INTRAVENOUS | Status: AC
  Filled 2015-09-04: qty 50

## 2015-09-04 MED ORDER — SODIUM CHLORIDE 0.9 % IR SOLN
Status: DC | PRN
  Administered 2015-09-04: 1000 mL

## 2015-09-04 MED ORDER — EPHEDRINE SULFATE 50 MG/ML IJ SOLN
INTRAMUSCULAR | Status: AC
  Filled 2015-09-04: qty 1

## 2015-09-04 MED ORDER — PHENYLEPHRINE HCL 10 MG/ML IJ SOLN
INTRAMUSCULAR | Status: AC
  Filled 2015-09-04: qty 1

## 2015-09-04 MED ORDER — FENTANYL CITRATE (PF) 100 MCG/2ML IJ SOLN
INTRAMUSCULAR | Status: AC
  Filled 2015-09-04: qty 2

## 2015-09-04 MED ORDER — POLYETHYLENE GLYCOL 3350 17 G PO PACK
17.0000 g | PACK | Freq: Two times a day (BID) | ORAL | Status: DC
  Administered 2015-09-04 – 2015-09-05 (×2): 17 g via ORAL

## 2015-09-04 MED ORDER — ONDANSETRON HCL 4 MG PO TABS: 4.0000 mg | ORAL_TABLET | Freq: Four times a day (QID) | ORAL | Status: DC | PRN

## 2015-09-04 MED ORDER — CELECOXIB 200 MG PO CAPS
200.0000 mg | ORAL_CAPSULE | Freq: Two times a day (BID) | ORAL | Status: DC
  Administered 2015-09-04 – 2015-09-05 (×2): 200 mg via ORAL
  Filled 2015-09-04 (×3): qty 1

## 2015-09-04 MED ORDER — FENTANYL CITRATE (PF) 100 MCG/2ML IJ SOLN
INTRAMUSCULAR | Status: DC | PRN
  Administered 2015-09-04: 50 ug via INTRAVENOUS

## 2015-09-04 MED ORDER — FERROUS SULFATE 325 (65 FE) MG PO TABS
325.0000 mg | ORAL_TABLET | Freq: Three times a day (TID) | ORAL | Status: DC
  Administered 2015-09-04: 325 mg via ORAL
  Filled 2015-09-04 (×5): qty 1

## 2015-09-04 MED ORDER — CHLORHEXIDINE GLUCONATE 4 % EX LIQD: 60.0000 mL | Freq: Once | CUTANEOUS | Status: DC

## 2015-09-04 MED ORDER — ONDANSETRON HCL 4 MG/2ML IJ SOLN
INTRAMUSCULAR | Status: AC
  Filled 2015-09-04: qty 2

## 2015-09-04 MED ORDER — MEPERIDINE HCL 50 MG/ML IJ SOLN: 6.2500 mg | INTRAMUSCULAR | Status: DC | PRN

## 2015-09-04 MED ORDER — OXYCODONE HCL 5 MG/5ML PO SOLN: 5.0000 mg | Freq: Once | ORAL | Status: DC | PRN

## 2015-09-04 MED ORDER — POTASSIUM CHLORIDE 2 MEQ/ML IV SOLN
100.0000 mL/h | INTRAVENOUS | Status: DC
  Administered 2015-09-04 – 2015-09-05 (×2): 100 mL/h via INTRAVENOUS
  Filled 2015-09-04 (×4): qty 1000

## 2015-09-04 MED ORDER — MENTHOL 3 MG MT LOZG: 1.0000 | LOZENGE | OROMUCOSAL | Status: DC | PRN

## 2015-09-04 MED ORDER — DEXAMETHASONE SODIUM PHOSPHATE 10 MG/ML IJ SOLN: 10.0000 mg | Freq: Once | INTRAMUSCULAR | Status: DC

## 2015-09-04 MED ORDER — HYDROMORPHONE HCL 1 MG/ML IJ SOLN
0.5000 mg | INTRAMUSCULAR | Status: DC | PRN
  Administered 2015-09-04: 1 mg via INTRAVENOUS
  Filled 2015-09-04: qty 1

## 2015-09-04 MED ORDER — PHENYLEPHRINE HCL 10 MG/ML IJ SOLN
10.0000 mg | INTRAVENOUS | Status: DC | PRN
  Administered 2015-09-04: 40 ug/min via INTRAVENOUS

## 2015-09-04 MED ORDER — POLYVINYL ALCOHOL 1.4 % OP SOLN
1.0000 [drp] | Freq: Three times a day (TID) | OPHTHALMIC | Status: DC | PRN
Start: 2015-09-04 — End: 2015-09-05
  Filled 2015-09-04: qty 15

## 2015-09-04 MED ORDER — TRANEXAMIC ACID 1000 MG/10ML IV SOLN
1000.0000 mg | Freq: Once | INTRAVENOUS | Status: AC
  Administered 2015-09-04: 1000 mg via INTRAVENOUS
  Filled 2015-09-04: qty 10

## 2015-09-04 MED ORDER — BISACODYL 10 MG RE SUPP: 10.0000 mg | Freq: Every day | RECTAL | Status: DC | PRN

## 2015-09-04 MED ORDER — OXYCODONE HCL 5 MG PO TABS: 5.0000 mg | ORAL_TABLET | Freq: Once | ORAL | Status: DC | PRN

## 2015-09-04 MED ORDER — GABAPENTIN 300 MG PO CAPS
300.0000 mg | ORAL_CAPSULE | Freq: Two times a day (BID) | ORAL | Status: DC
  Administered 2015-09-04 – 2015-09-05 (×3): 300 mg via ORAL
  Filled 2015-09-04 (×4): qty 1

## 2015-09-04 MED ORDER — DEXAMETHASONE SODIUM PHOSPHATE 10 MG/ML IJ SOLN
10.0000 mg | Freq: Once | INTRAMUSCULAR | Status: AC
  Administered 2015-09-04: 10 mg via INTRAVENOUS

## 2015-09-04 MED ORDER — BUPIVACAINE IN DEXTROSE 0.75-8.25 % IT SOLN
INTRATHECAL | Status: DC | PRN
  Administered 2015-09-04: 1.8 mL via INTRATHECAL

## 2015-09-04 SURGICAL SUPPLY — 33 items
BAG DECANTER FOR FLEXI CONT (MISCELLANEOUS) IMPLANT
BAG ZIPLOCK 12X15 (MISCELLANEOUS) IMPLANT
CAPT HIP TOTAL 2 ×2 IMPLANT
CLOTH BEACON ORANGE TIMEOUT ST (SAFETY) ×2 IMPLANT
COVER PERINEAL POST (MISCELLANEOUS) ×2 IMPLANT
DRAPE STERI IOBAN 125X83 (DRAPES) ×2 IMPLANT
DRAPE U-SHAPE 47X51 STRL (DRAPES) ×4 IMPLANT
DRSG AQUACEL AG ADV 3.5X10 (GAUZE/BANDAGES/DRESSINGS) ×2 IMPLANT
DURAPREP 26ML APPLICATOR (WOUND CARE) ×2 IMPLANT
ELECT REM PT RETURN 15FT ADLT (MISCELLANEOUS) IMPLANT
ELECT REM PT RETURN 9FT ADLT (ELECTROSURGICAL) ×2
ELECTRODE REM PT RTRN 9FT ADLT (ELECTROSURGICAL) ×1 IMPLANT
GLOVE BIOGEL M 7.0 STRL (GLOVE) IMPLANT
GLOVE BIOGEL PI IND STRL 7.5 (GLOVE) ×1 IMPLANT
GLOVE BIOGEL PI IND STRL 8.5 (GLOVE) ×1 IMPLANT
GLOVE BIOGEL PI INDICATOR 7.5 (GLOVE) ×1
GLOVE BIOGEL PI INDICATOR 8.5 (GLOVE) ×1
GLOVE ECLIPSE 8.0 STRL XLNG CF (GLOVE) ×4 IMPLANT
GLOVE ORTHO TXT STRL SZ7.5 (GLOVE) ×2 IMPLANT
GOWN STRL REUS W/TWL LRG LVL3 (GOWN DISPOSABLE) ×2 IMPLANT
GOWN STRL REUS W/TWL XL LVL3 (GOWN DISPOSABLE) ×2 IMPLANT
HOLDER FOLEY CATH W/STRAP (MISCELLANEOUS) ×2 IMPLANT
LIQUID BAND (GAUZE/BANDAGES/DRESSINGS) ×2 IMPLANT
PACK ANTERIOR HIP CUSTOM (KITS) ×2 IMPLANT
SAW OSC TIP CART 19.5X105X1.3 (SAW) ×2 IMPLANT
SUT MNCRL AB 4-0 PS2 18 (SUTURE) ×2 IMPLANT
SUT VIC AB 1 CT1 36 (SUTURE) ×6 IMPLANT
SUT VIC AB 2-0 CT1 27 (SUTURE) ×2
SUT VIC AB 2-0 CT1 TAPERPNT 27 (SUTURE) ×2 IMPLANT
SUT VLOC 180 0 24IN GS25 (SUTURE) ×2 IMPLANT
TRAY FOLEY W/METER SILVER 14FR (SET/KITS/TRAYS/PACK) IMPLANT
WATER STERILE IRR 1500ML POUR (IV SOLUTION) ×2 IMPLANT
YANKAUER SUCT BULB TIP 10FT TU (MISCELLANEOUS) IMPLANT

## 2015-09-04 NOTE — Progress Notes (Signed)
Per Dr Alvan Dame, after reviewing 2nd XRAY, keep pillow under leg and knee upright, until full motor strength returns to R leg.

## 2015-09-04 NOTE — Anesthesia Procedure Notes (Signed)
Spinal Patient location during procedure: OR Start time: 09/04/2015 9:52 AM End time: 09/04/2015 9:59 AM Reason for block: at surgeon's request Staffing Resident/CRNA: Anne Fu Performed by: resident/CRNA  Preanesthetic Checklist Completed: patient identified, site marked, surgical consent, pre-op evaluation, timeout performed, IV checked, risks and benefits discussed, monitors and equipment checked and at surgeon's request Spinal Block Patient position: sitting Approach: right paramedian Location: L2-3 Injection technique: single-shot Needle Needle type: Spinocan  Needle gauge: 22 G Needle length: 9 cm Assessment Sensory level: T6 Additional Notes Expiration date of kit checked and confirmed. Patient tolerated procedure well, without complications. X 1 attempt with noted clear CSF return. Loss of motor and sensory on exam post injection.

## 2015-09-04 NOTE — Progress Notes (Signed)
6th floor notified pt will be in 1618 in 20-25 minutes, needs pulsox and IV pump.

## 2015-09-04 NOTE — Progress Notes (Signed)
  Oncology Nurse Navigator Documentation  Navigator Location: [facility] (09/04/15 1500) Navigator Encounter Type: Letter/Fax/Email (card) (09/04/15 1500)                                          Time Spent with Patient: 15 (09/04/15 1500)   Thinking of you card mailed to patient. 

## 2015-09-04 NOTE — Transfer of Care (Signed)
Immediate Anesthesia Transfer of Care Note  Patient: [name]  Procedure(s) Performed: Procedure(s): RIGHT TOTAL HIP ARTHROPLASTY ANTERIOR APPROACH (Right)  Patient Location: PACU  Anesthesia Type:Spinal  Level of Consciousness: awake  Airway & Oxygen Therapy: Patient Spontanous Breathing and Patient connected to face mask oxygen  Post-op Assessment: Report given to RN and Post -op Vital signs reviewed and stable  Post vital signs: Reviewed and stable  Last Vitals:  Filed Vitals:   09/04/15 0716  BP: 118/64  Pulse: 71  Temp: 36.4 C  Resp: 18    Complications: No apparent anesthesia complications

## 2015-09-04 NOTE — Evaluation (Signed)
Physical Therapy Evaluation Patient Details Name: [name] MRN: [id number] DOB: 12-Sep-1940 Today's Date: 09/04/2015   History of Present Illness  s/p R DA THA  Clinical Impression  Pt admitted with above diagnosis. Pt currently with functional limitations due to the deficits listed below (see PT Problem List). * Pt will benefit from skilled PT to increase their independence and safety with mobility to allow discharge to the venue listed below.  Plan is for HHPT with husband support     Follow Up Recommendations Home health PT;Supervision for mobility/OOB    Equipment Recommendations  None recommended by PT    Recommendations for Other Services       Precautions / Restrictions Precautions Precautions: Fall Restrictions Weight Bearing Restrictions: No Other Position/Activity Restrictions: WBAT      Mobility  Bed Mobility Overal bed mobility: Needs Assistance Bed Mobility: Supine to Sit     Supine to sit: Min assist     General bed mobility comments: cues for technique,  assist with RLE and to bring trunk to upright  Transfers Overall transfer level: Needs assistance Equipment used: Rolling walker (2 wheeled) Transfers: Sit to/from Stand           General transfer comment: cues for hand placement and safety  Ambulation/Gait Ambulation/Gait assistance: Min assist Ambulation Distance (Feet): 18 Feet Assistive device: Rolling walker (2 wheeled) Gait Pattern/deviations: Step-to pattern;Antalgic     General Gait Details: cues for sequence, RW position and step length, assist for balance, unsteady at times  Stairs            Wheelchair Mobility    Modified Rankin (Stroke Patients Only)       Balance Overall balance assessment: Needs assistance         Standing balance support: Bilateral upper extremity supported;During functional activity Standing balance-Leahy Scale: Poor                               Pertinent  Vitals/Pain Pain Assessment: 0-10 Pain Score: 8  Pain Location: R hip  Pain Intervention(s): Limited activity within patient's tolerance;Monitored during session;Repositioned;Premedicated before session;Ice applied    Home Living Family/patient expects to be discharged to:: Private residence Living Arrangements: Spouse/significant other   Type of Home: House Home Access: Stairs to enter   [employer] of Steps: 1-2 Home Layout: Two level;Able to live on main level with bedroom/bathroom Home Equipment: Gilford Rile - 2 wheels;Cane - single point;Toilet riser (riser has handles)      Prior Function Level of Independence: Independent;Independent with assistive device(s)         Comments: amb with cane or husband support     Hand Dominance        Extremity/Trunk Assessment   Upper Extremity Assessment: Defer to OT evaluation           Lower Extremity Assessment: RLE deficits/detail RLE Deficits / Details: ankle WFL, AAROM grossly WFL, hip flexion and knee extension grossly 3/5       Communication   Communication: No difficulties  Cognition Arousal/Alertness: Awake/alert Behavior During Therapy: WFL for tasks assessed/performed   Area of Impairment: Memory               General Comments: decr memory per chart    General Comments      Exercises Total Joint Exercises Ankle Circles/Pumps: AROM;Both;10 reps Quad Sets: AROM;Both;10 reps;Strengthening      Assessment/Plan    PT Assessment Patient needs continued  PT services  PT Diagnosis Difficulty walking   PT Problem List Decreased activity tolerance;Decreased mobility;Decreased strength;Pain;Decreased knowledge of use of DME  PT Treatment Interventions DME instruction;Gait training;Functional mobility training;Therapeutic activities;Therapeutic exercise;Patient/family education;Stair training   PT Goals (Current goals can be found in the Care Plan section) Acute Rehab PT Goals Patient Stated  Goal: move with less pain PT Goal Formulation: With patient Time For Goal Achievement: 09/06/15 Potential to Achieve Goals: Good    Frequency 7X/week   Barriers to discharge        Co-evaluation               End of Session Equipment Utilized During Treatment: Gait belt Activity Tolerance: Patient tolerated treatment well Patient left: with call bell/phone within reach;with family/visitor present;in chair;with chair alarm set Nurse Communication: Mobility status         Time: [id number] PT Time Calculation (min) (ACUTE ONLY): 19 min   Charges:   PT Evaluation $PT Eval Low Complexity: 1 Procedure     PT G Codes:        Sanika Brosious 02-Oct-2015, 4:43 PM

## 2015-09-04 NOTE — Progress Notes (Signed)
Report from [place], [place]. Care assumed for pt at this time. Assessment unchanged from previous assessment. Aquacell to rt hip intact. NV checks intact. Medicated for 8/10 pain. Will monitor. Husband bedside. Callbell and personal belongings in reach.

## 2015-09-04 NOTE — Anesthesia Postprocedure Evaluation (Signed)
Anesthesia Post Note  Patient: [name]  Procedure(s) Performed: Procedure(s) (LRB): RIGHT TOTAL HIP ARTHROPLASTY ANTERIOR APPROACH (Right)  Patient location during evaluation: PACU Anesthesia Type: General Level of consciousness: awake and alert Pain management: pain level controlled Vital Signs Assessment: post-procedure vital signs reviewed and stable Respiratory status: spontaneous breathing, nonlabored ventilation, respiratory function stable and patient connected to nasal cannula oxygen Cardiovascular status: blood pressure returned to baseline and stable Postop Assessment: no signs of nausea or vomiting Anesthetic complications: no    Last Vitals:  Filed Vitals:   09/04/15 1215 09/04/15 1230  BP: 107/89 115/58  Pulse: 56 51  Temp: 36.3 C   Resp: 8 9    Last Pain:  Filed Vitals:   09/04/15 1231  PainSc: 0-No pain                 Vonette Grosso A

## 2015-09-04 NOTE — Progress Notes (Signed)
Utilization review completed.  

## 2015-09-04 NOTE — Interval H&P Note (Signed)
History and Physical Interval Note:  09/04/2015 8:45 AM  [name]  has presented today for surgery, with the diagnosis of RIGHT HIP OA  The various methods of treatment have been discussed with the patient and family. After consideration of risks, benefits and other options for treatment, the patient has consented to  Procedure(s): RIGHT TOTAL HIP ARTHROPLASTY ANTERIOR APPROACH (Right) as a surgical intervention .  The patient's history has been reviewed, patient examined, no change in status, stable for surgery.  I have reviewed the patient's chart and labs.  Questions were answered to the patient's satisfaction.     Mauri Pole

## 2015-09-04 NOTE — Discharge Instructions (Signed)

## 2015-09-04 NOTE — Op Note (Signed)
NAME:  [name]                ACCOUNT NO.: [id number]      MEDICAL RECORD NO.: [id number]      FACILITY:  [facility]      PHYSICIAN:  [name] D  DATE OF BIRTH:  May 03, 1941     DATE OF PROCEDURE:  09/04/2015                                 OPERATIVE REPORT         PREOPERATIVE DIAGNOSIS: Right  hip osteoarthritis.      POSTOPERATIVE DIAGNOSIS:  Right hip osteoarthritis.      PROCEDURE:  Right total hip replacement through an anterior approach   utilizing DePuy THR system, component size 87mm pinnacle cup, a size 32+4 neutral   Altrex liner, a size 3 Hi Tri Lock stem with a 32+1 delta ceramic   ball.      SURGEON:  Pietro Cassis. Alvan Dame, M.D.      ASSISTANT:  Michele Orleans, PA-C      ANESTHESIA:  Spinal.      SPECIMENS:  None.      COMPLICATIONS:  None.      BLOOD LOSS:  400 cc     DRAINS:  None      INDICATION OF THE PROCEDURE:  [name] is a 75 y.o. female who had   presented to office for evaluation of right hip pain.  Radiographs revealed   progressive degenerative changes with bone-on-bone   articulation to the  hip joint.  The patient had painful limited range of   motion significantly affecting their overall quality of life.  The patient was failing to    respond to conservative measures, and at this point was ready   to proceed with more definitive measures.  The patient has noted progressive   degenerative changes in his hip, progressive problems and dysfunction   with regarding the hip prior to surgery.  Consent was obtained for   benefit of pain relief.  Specific risk of infection, DVT, component   failure, dislocation, need for revision surgery, as well discussion of   the anterior versus posterior approach were reviewed.  Consent was   obtained for benefit of anterior pain relief through an anterior   approach.      PROCEDURE IN DETAIL:  The patient was brought to operative theater.   Once adequate anesthesia,  preoperative antibiotics, 2gm of Ancef, 1 gm of Tranexamic Acid, and 10 mg of Decadron administered.   The patient was positioned supine on the OSI Hanna table.  Once adequate   padding of boney process was carried out, we had predraped out the hip, and  used fluoroscopy to confirm orientation of the pelvis and position.      The right hip was then prepped and draped from proximal iliac crest to   mid thigh with shower curtain technique.      Time-out was performed identifying the patient, planned procedure, and   extremity.     An incision was then made 2 cm distal and lateral to the   anterior superior iliac spine extending over the orientation of the   tensor fascia lata muscle and sharp dissection was carried down to the   fascia of the muscle and protractor placed in the soft tissues.      The fascia  was then incised.  The muscle belly was identified and swept   laterally and retractor placed along the superior neck.  Following   cauterization of the circumflex vessels and removing some pericapsular   fat, a second cobra retractor was placed on the inferior neck.  A third   retractor was placed on the anterior acetabulum after elevating the   anterior rectus.  A L-capsulotomy was along the line of the   superior neck to the trochanteric fossa, then extended proximally and   distally.  Tag sutures were placed and the retractors were then placed   intracapsular.  We then identified the trochanteric fossa and   orientation of my neck cut, confirmed this radiographically   and then made a neck osteotomy with the femur on traction.  The femoral   head was removed without difficulty or complication.  Traction was let   off and retractors were placed posterior and anterior around the   acetabulum.      The labrum and foveal tissue were debrided.  I began reaming with a 64mm   reamer and reamed up to 107mm reamer with good bony bed preparation and a 62mm   cup was chosen.  The final 23mm  Pinnacle cup was then impacted under fluoroscopy  to confirm the depth of penetration and orientation with respect to   abduction.  A screw was placed followed by the hole eliminator.  The final   32+4 neutral Altrex liner was impacted with good visualized rim fit.  The cup was positioned anatomically within the acetabular portion of the pelvis.      At this point, the femur was rolled at 80 degrees.  Further capsule was   released off the inferior aspect of the femoral neck.  I then   released the superior capsule proximally.  The hook was placed laterally   along the femur and elevated manually and held in position with the bed   hook.  The leg was then extended and adducted with the leg rolled to 100   degrees of external rotation.  Once the proximal femur was fully   exposed, I used a box osteotome to set orientation.  I then began   broaching with the starting chili pepper broach and passed this by hand and then broached up to 3.  With the 3 broach in place I chose a high offset neck and did several trial reductions due to her coxa breva in an effort to best match her offset and leg lengths.   Given these findings, I went ahead and dislocated the hip, repositioned all   retractors and positioned the right hip in the extended and abducted position.  The final 3 Hi Tri Lock stem was   chosen and it was impacted down to the level of neck cut.  Based on this   and the trial reduction, a 32+1 delta ceramic ball was chosen and   impacted onto a clean and dry trunnion, and the hip was reduced.  The   hip had been irrigated throughout the case again at this point.  I did   reapproximate the superior capsular leaflet to the anterior leaflet   using #1 Vicryl, placed a medium Hemovac drain deep.  The fascia of the   tensor fascia lata muscle was then reapproximated using #1 Vicryl and #0 V-lock sutures.  The   remaining wound was closed with 2-0 Vicryl and running 4-0 Monocryl.   The hip was  cleaned, dried,  and dressed sterilely using Dermabond and   Aquacel dressing.  She was then brought   to recovery room in stable condition tolerating the procedure well.    Michele Orleans, PA-C was present for the entirety of the case involved from   preoperative positioning, perioperative retractor management, general   facilitation of the case, as well as primary wound closure as assistant.            Pietro Cassis Alvan Dame, M.D.        09/04/2015 11:29 AM

## 2015-09-04 NOTE — Anesthesia Preprocedure Evaluation (Signed)
Anesthesia Evaluation  Patient identified by MRN, date of birth, ID band Patient awake    Reviewed: Allergy & Precautions, NPO status , Patient's Chart, lab work & pertinent test results  Airway Mallampati: I  TM Distance: >3 FB Neck ROM: Full    Dental  (+) Dental Advisory Given, Teeth Intact   Pulmonary former smoker,    breath sounds clear to auscultation       Cardiovascular  Rhythm:Regular     Neuro/Psych    GI/Hepatic   Endo/Other    Renal/GU      Musculoskeletal   Abdominal   Peds  Hematology   Anesthesia Other Findings   Reproductive/Obstetrics                             Anesthesia Physical Anesthesia Plan  ASA: II  Anesthesia Plan: Spinal   Post-op Pain Management:    Induction: Intravenous  Airway Management Planned: Simple Face Mask  Additional Equipment:   Intra-op Plan:   Post-operative Plan:   Informed Consent: I have reviewed the patients History and Physical, chart, labs and discussed the procedure including the risks, benefits and alternatives for the proposed anesthesia with the patient or authorized representative who has indicated his/her understanding and acceptance.   Dental advisory given  Plan Discussed with: CRNA, Anesthesiologist and Surgeon  Anesthesia Plan Comments:         Anesthesia Quick Evaluation

## 2015-09-04 NOTE — Progress Notes (Signed)
[name] PA advised once pt has full motor strength she will have no restrictions, no additional Xrays needed at this time.  [name] RN updated.

## 2015-09-05 LAB — CBC
HCT: 29.8 % — ABNORMAL LOW (ref 36.0–46.0)
Hemoglobin: 9.5 g/dL — ABNORMAL LOW (ref 12.0–15.0)
MCH: 30.6 pg (ref 26.0–34.0)
MCHC: 31.9 g/dL (ref 30.0–36.0)
MCV: 96.1 fL (ref 78.0–100.0)
PLATELETS: 216 10*3/uL (ref 150–400)
RBC: 3.1 MIL/uL — AB (ref 3.87–5.11)
RDW: 13.6 % (ref 11.5–15.5)
WBC: 11.2 10*3/uL — ABNORMAL HIGH (ref 4.0–10.5)

## 2015-09-05 LAB — BASIC METABOLIC PANEL
Anion gap: 8 (ref 5–15)
BUN: 11 mg/dL (ref 6–20)
CO2: 26 mmol/L (ref 22–32)
CREATININE: 0.67 mg/dL (ref 0.44–1.00)
Calcium: 8.5 mg/dL — ABNORMAL LOW (ref 8.9–10.3)
Chloride: 108 mmol/L (ref 101–111)
GFR calc Af Amer: >60 mL/min (ref >60)
Glucose, Bld: 137 mg/dL — ABNORMAL HIGH (ref 65–99)
POTASSIUM: 4.1 mmol/L (ref 3.5–5.1)
SODIUM: 142 mmol/L (ref 135–145)

## 2015-09-05 MED ORDER — TIZANIDINE HCL 4 MG PO TABS: 4.0000 mg | ORAL_TABLET | Freq: Four times a day (QID) | ORAL | Status: DC | PRN

## 2015-09-05 MED ORDER — ASPIRIN 325 MG PO TBEC: 325.0000 mg | DELAYED_RELEASE_TABLET | Freq: Two times a day (BID) | ORAL | Status: AC

## 2015-09-05 MED ORDER — FERROUS SULFATE 325 (65 FE) MG PO TABS: 325.0000 mg | ORAL_TABLET | Freq: Three times a day (TID) | ORAL | Status: DC

## 2015-09-05 MED ORDER — HYDROCODONE-ACETAMINOPHEN 7.5-325 MG PO TABS: 1.0000 | ORAL_TABLET | ORAL | Status: DC | PRN

## 2015-09-05 MED ORDER — DOCUSATE SODIUM 100 MG PO CAPS: 100.0000 mg | ORAL_CAPSULE | Freq: Two times a day (BID) | ORAL | Status: DC

## 2015-09-05 MED ORDER — POLYETHYLENE GLYCOL 3350 17 G PO PACK: 17.0000 g | PACK | Freq: Two times a day (BID) | ORAL | Status: DC

## 2015-09-05 NOTE — Progress Notes (Signed)
     Subjective: 1 Day Post-Op Procedure(s) (LRB): RIGHT TOTAL HIP ARTHROPLASTY ANTERIOR APPROACH (Right)   Seen By Dr. Alvan Dame. Patient reports pain as mild, pain controlled. No events throughout the night. Ready to be discharged home if she does well with PT and pain stays controlled.   Objective:   VITALS:   Filed Vitals:   09/05/15 0256 09/05/15 0624  BP: 101/52 94/55  Pulse: 77 71  Temp: 98.3 F (36.8 C) 98.2 F (36.8 C)  Resp: 15 15    Dorsiflexion/Plantar flexion intact Incision: dressing C/D/I No cellulitis present Compartment soft  LABS  Recent Labs  09/05/15 0459  HGB 9.5*  HCT 29.8*  WBC 11.2*  PLT 216     Recent Labs  09/05/15 0459  NA 142  K 4.1  BUN 11  CREATININE 0.67  GLUCOSE 137*     Assessment/Plan: 1 Day Post-Op Procedure(s) (LRB): RIGHT TOTAL HIP ARTHROPLASTY ANTERIOR APPROACH (Right) Foley cath d/c'ed Advance diet Up with therapy D/C IV fluids Discharge home with home health  Follow up in 2 weeks at [facility] Follow up with OLIN,Ayane Delancey D in 2 weeks.  Contact information:  [facility] [address] [place]   PAC  09/05/2015, 8:15 AM

## 2015-09-05 NOTE — Progress Notes (Signed)
Physical Therapy Treatment Patient Details Name: [name] MRN: [id number] DOB: [birth year] Today's Date: 09/05/2015    History of Present Illness Pt is a 75 year old female s/p R DA THA    PT Comments    Pt ambulated in hallway, practiced safe stair technique and performed LE exercises.  Spouse present and educated as well.  Pt provided with HEP handout.  Pt and spouse feel ready for d/c home today.  Follow Up Recommendations  Home health PT;Supervision for mobility/OOB     Equipment Recommendations  None recommended by PT    Recommendations for Other Services       Precautions / Restrictions Precautions Precautions: Fall Restrictions Weight Bearing Restrictions: No Other Position/Activity Restrictions: WBAT    Mobility  Bed Mobility               General bed mobility comments: pt up in recliner on arrival  Transfers Overall transfer level: Needs assistance Equipment used: Rolling walker (2 wheeled) Transfers: Sit to/from Stand Sit to Stand: Supervision         General transfer comment: close supervision for safety, verbal cues for hand placement  Ambulation/Gait Ambulation/Gait assistance: Min guard Ambulation Distance (Feet): 120 Feet Assistive device: Rolling walker (2 wheeled) Gait Pattern/deviations: Step-to pattern;Antalgic     General Gait Details: verbal cues for sequence, RW position and step length   Stairs Stairs: Yes Stairs assistance: Min guard Stair Management: Step to pattern;Forwards;Two rails Number of Stairs: 2 General stair comments: verbal cues for safety and sequence, performed 2 steps twice, spouse present and observed  Wheelchair Mobility    Modified Rankin (Stroke Patients Only)       Balance                                    Cognition Arousal/Alertness: Awake/alert Behavior During Therapy: WFL for tasks assessed/performed Overall Cognitive Status: Within Functional Limits for tasks  assessed                 General Comments: decr memory per chart    Exercises General Exercises - Lower Extremity Ankle Circles/Pumps: AROM;Both;10 reps Quad Sets: AROM;Both;10 reps Short Arc Quad: AROM;Right;10 reps Long Arc Quad: AROM;Right;10 reps Heel Slides: AAROM;Right;10 reps Hip ABduction/ADduction: AAROM;Right;10 reps    General Comments        Pertinent Vitals/Pain Pain Assessment: 0-10 Pain Score: 4  Pain Location: R hip Pain Descriptors / Indicators: Sore;Aching Pain Intervention(s): Limited activity within patient's tolerance;Monitored during session;Repositioned;Ice applied    Home Living Family/patient expects to be discharged to:: Private residence Living Arrangements: Spouse/significant other   Type of Home: House Home Access: Stairs to enter   Home Layout: Two level;Able to live on main level with bedroom/bathroom Home Equipment: Gilford Rile - 2 wheels;Cane - single point;Toilet riser (riser has handles)      Prior Function Level of Independence: Independent;Independent with assistive device(s)      Comments: amb with cane or husband support   PT Goals (current goals can now be found in the care plan section) Progress towards PT goals: Progressing toward goals    Frequency  7X/week    PT Plan Current plan remains appropriate    Co-evaluation             End of Session   Activity Tolerance: Patient tolerated treatment well Patient left: in chair;with call bell/phone within reach;with nursing/sitter in room;with family/visitor present  Time: [id number] PT Time Calculation (min) (ACUTE ONLY): 19 min  Charges:  $Gait Training: 8-22 mins                    G Codes:      Annice Jolly,KATHrine E Sep 18, 2015, 11:43 AM Carmelia Bake, PT, DPT 09-18-2015 Pager: [phone]

## 2015-09-05 NOTE — Evaluation (Signed)
  Occupational Therapy Evaluation Patient Details Name: [name] MRN: [id number] DOB: Apr 11, 1941 Today's Date: 09-11-2015    History of Present Illness Pt is a 75 year old female s/p R DA THA   Clinical Impression   OT education complete     Follow Up Recommendations  No OT follow up    Equipment Recommendations  None recommended by OT       Precautions / Restrictions Precautions Precautions: Fall Restrictions Weight Bearing Restrictions: No Other Position/Activity Restrictions: WBAT      Mobility Bed Mobility               General bed mobility comments: pt in chair  Transfers Overall transfer level: Needs assistance Equipment used: Rolling walker (2 wheeled) Transfers: Sit to/from Stand Sit to Stand: Min guard         General transfer comment: close supervision for safety, verbal cues for hand placement         ADL Overall ADL's : Needs assistance/impaired                     Lower Body Dressing: Minimal assistance;Sit to/from stand;Cueing for sequencing;Cueing for safety   Toilet Transfer: Min guard;RW;Cueing for sequencing;Regular Toilet;Comfort height toilet;Ambulation   Toileting- Clothing Manipulation and Hygiene: Sit to/from stand;Cueing for sequencing;Cueing for safety;Min guard         General ADL Comments: Educated regarding ADL activity s/p THA.                 Pertinent Vitals/Pain Pain Assessment: 0-10 Pain Score: 3  Pain Location: r hip Pain Descriptors / Indicators: Sore Pain Intervention(s): Monitored during session        Extremity/Trunk Assessment Upper Extremity Assessment Upper Extremity Assessment: Generalized weakness           Communication Communication Communication: No difficulties   Cognition Arousal/Alertness: Awake/alert Behavior During Therapy: WFL for tasks assessed/performed Overall Cognitive Status: Within Functional Limits for tasks assessed                 General  Comments: decr memory per chart              Home Living Family/patient expects to be discharged to:: Private residence Living Arrangements: Spouse/significant other   Type of Home: House Home Access: Stairs to enter [employer] of Steps: 1-2   Home Layout: Two level;Able to live on main level with bedroom/bathroom     Bathroom Shower/Tub: Walk-in shower (upstairs)   Bathroom Toilet: Handicapped height     Home Equipment: Environmental consultant - 2 wheels;Cane - single point;Toilet riser (riser has handles)          Prior Functioning/Environment Level of Independence: Independent;Independent with assistive device(s)        Comments: amb with cane or husband support                              End of Session Equipment Utilized During Treatment: Rolling walker Nurse Communication: Mobility status  Activity Tolerance: Patient tolerated treatment well Patient left: in chair   Time: 1051-1115 OT Time Calculation (min): 24 min Charges:  OT General Charges $OT Visit: 1 Procedure OT Evaluation $OT Eval Low Complexity: 1 Procedure OT Treatments $Self Care/Home Management : 8-22 mins G-Codes:    Payton Mccallum D 09/11/15, 12:19 PM

## 2015-09-05 NOTE — Care Management Note (Signed)
Case Management Note  Patient Details  Name: [name] MRN: [id number] Date of Birth: 1941-07-01  Subjective/Objective:                  RIGHT TOTAL HIP ARTHROPLASTY ANTERIOR APPROACH (Right)  Action/Plan: Discharge planning Expected Discharge Date:  09/06/15               Expected Discharge Plan:  [place]  In-House Referral:     Discharge planning Services  CM Consult  Post Acute Care Choice:    Choice offered to:  Patient  DME Arranged:  N/A DME Agency:  NA  HH Arranged:  NA, Patient Refused [place] Agency:     Status of Service:  Completed, signed off  Medicare Important Message Given:    Date Medicare IM Given:    Medicare IM give by:    Date Additional Medicare IM Given:    Additional Medicare Important Message give by:     If discussed at [place] of Stay Meetings, dates discussed:    Additional Comments: CM met with pt in room to offer choice of home health agency.  Pt politely refuses all [place] services.  Pt has both rolling walker and 3n1 at home.  No other CM needs were communicated. Dellie Catholic, RN 09/05/2015, 10:10 AM

## 2015-09-13 NOTE — Discharge Summary (Signed)
Physician Discharge Summary  Patient ID: [name] MRN: [id number] DOB/AGE: [birth year] 74 y.o.  Admit date: 09/04/2015 Discharge date: 09/05/2015   Procedures:  Procedure(s) (LRB): RIGHT TOTAL HIP ARTHROPLASTY ANTERIOR APPROACH (Right)  Attending Physician:  Dr. [name]   Admission Diagnoses:   Right hip primary OA / pain  Discharge Diagnoses:  Principal Problem:   S/P right THA, AA  Past Medical History  Diagnosis Date  . 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 Hearing loss   . Memory impairment     seen by Dr Manuella Ghazi  possible post crainiotomy from radiation  . 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  . History of radiation therapy   . Wears glasses   . Numbness and tingling     right hand   . Cataract     bilat   . Hard of hearing     wears hearing aides bilat   . Imbalance   . Arthritis   . History of cancer chemotherapy     HPI:    [name], 75 y.o. female, has a history of pain and functional disability in the right hip(s) due to arthritis and patient has failed non-surgical conservative treatments for greater than 12 weeks to include NSAID's and/or analgesics, corticosteriod injections and activity modification. Onset of symptoms was gradual starting 5+ years ago with gradually worsening course since that time.The patient noted no past surgery on the right hip(s). Patient currently rates pain in the right hip at 8 out of 10 with activity. Patient has night pain, worsening of pain with activity and weight bearing, trendelenberg gait, pain that interfers with activities of daily living and pain with passive range of motion. Patient has evidence of periarticular osteophytes and joint space narrowing by imaging studies. This condition presents safety issues increasing the risk of falls. There is no current active infection.  Risks, benefits and expectations were discussed with the patient. Risks including but not limited to the risk of anesthesia, blood clots, nerve damage, blood vessel damage, failure of the prosthesis, infection and up to and including death. Patient understand the risks, benefits and expectations and wishes to proceed with surgery.   PCP: [name], MD   Discharged Condition: good  Hospital Course:  Patient underwent the above stated procedure on 09/04/2015. Patient tolerated the procedure well and brought to the recovery room in good condition and subsequently to the floor.  POD #1 BP: 94/55 ; Pulse: 71 ; Temp: 98.2 F (36.8 C) ; Resp: 15 Patient reports pain as mild, pain controlled. No events throughout the night. Ready to be discharged home. Dorsiflexion/plantar flexion intact, incision: dressing C/D/I, no cellulitis present and compartment soft.   LABS  Basename    HGB     9.5  HCT     29.8    Discharge Exam: General appearance: alert, cooperative and no distress Extremities: Homans sign is negative, no sign of DVT, no edema, redness or tenderness in the calves or thighs and no ulcers, gangrene or trophic changes  Disposition: Home with follow up in 2 weeks   Follow-up Information    Follow up with Mauri Pole, MD. Schedule an appointment as soon as possible for a visit in 2 weeks.   Specialty:  Orthopedic Surgery   Contact information:   [address] [place]  [place] [address] [phone]       Discharge Instructions    Call MD / Call 911    Complete by:  As directed   If you experience chest pain or shortness of breath, CALL 911 and be transported to the hospital emergency room.  If you develope a fever above 101 F, pus (white drainage) or increased drainage or redness at the wound, or calf pain, call your surgeon's office.     Change dressing    Complete by:  As directed   Maintain surgical dressing until follow up in the clinic. If the edges  start to pull up, may reinforce with tape. If the dressing is no longer working, may remove and cover with gauze and tape, but must keep the area dry and clean.  Call with any questions or concerns.     Constipation Prevention    Complete by:  As directed   Drink plenty of fluids.  Prune juice may be helpful.  You may use a stool softener, such as Colace (over the counter) 100 mg twice a day.  Use MiraLax (over the counter) for constipation as needed.     Diet - low sodium heart healthy    Complete by:  As directed      Discharge instructions    Complete by:  As directed   Maintain surgical dressing until follow up in the clinic. If the edges start to pull up, may reinforce with tape. If the dressing is no longer working, may remove and cover with gauze and tape, but must keep the area dry and clean.  Follow up in 2 weeks at [facility] Call with any questions or concerns.     Increase activity slowly as tolerated    Complete by:  As directed   Weight bearing as tolerated with assist device (walker, cane, etc) as directed, use it as long as suggested by your surgeon or therapist, typically at least 4-6 weeks.     TED hose    Complete by:  As directed   Use stockings (TED hose) for 2 weeks on both leg(s).  You may remove them at night for sleeping.             Medication List    STOP taking these medications        HYDROcodone-acetaminophen 5-325 MG tablet  Commonly known as:  NORCO/VICODIN  Replaced by:  HYDROcodone-acetaminophen 7.5-325 MG tablet     meloxicam 7.5 MG tablet  Commonly known as:  MOBIC     naproxen sodium 220 MG tablet  Commonly known as:  ANAPROX     traMADol 50 MG tablet  Commonly known as:  ULTRAM      TAKE these medications        aspirin 325 MG EC tablet  Take 1 tablet (325 mg total) by mouth 2 (two) times daily.     CALTRATE 600+D PLUS MINERALS 600-800 MG-UNIT Tabs  Take 1 tablet by mouth every morning.     dexamethasone 4 MG tablet    Commonly known as:  DECADRON  Take 2 tablets (8 mg total) by mouth 2 (two) times daily. Start the day before Taxotere. Then again the day after chemo.     docusate sodium 100 MG capsule  Commonly known as:  COLACE  Take 1 capsule (100 mg total) by mouth 2 (two) times daily.     ferrous sulfate 325 (65 FE) MG tablet  Take 1 tablet (325 mg total) by mouth  3 (three) times daily after meals.     gabapentin 300 MG capsule  Commonly known as:  NEURONTIN  Limit 2-4 tabs by mouth per day if tolerated     HYDROcodone-acetaminophen 7.5-325 MG tablet  Commonly known as:  NORCO  Take 1-2 tablets by mouth every 4 (four) hours as needed for moderate pain.     lidocaine-prilocaine cream  Commonly known as:  EMLA  Apply 1 application topically as needed.     loratadine 10 MG tablet  Commonly known as:  CLARITIN  Take 10 mg by mouth 2 (two) times daily.     polyethylene glycol packet  Commonly known as:  MIRALAX / GLYCOLAX  Take 17 g by mouth 2 (two) times daily.     polyvinyl alcohol 1.4 % ophthalmic solution  Commonly known as:  LIQUIFILM TEARS  Place 1 drop into both eyes 3 (three) times daily as needed for dry eyes.     psyllium 0.52 g capsule  Commonly known as:  REGULOID  Take 0.52 g by mouth daily.     tiZANidine 4 MG tablet  Commonly known as:  ZANAFLEX  Take 1 tablet (4 mg total) by mouth every 6 (six) hours as needed for muscle spasms.     vitamin C 100 MG tablet  Take 100 mg by mouth every morning.         Signed: West Pugh. Aulton Routt   PA-C  09/13/2015, 10:44 AM

## 2015-09-19 ENCOUNTER — Other Ambulatory Visit: Payer: Self-pay | Admitting: *Deleted

## 2015-09-19 DIAGNOSIS — C50212 Malignant neoplasm of upper-inner quadrant of left female breast: Secondary | ICD-10-CM

## 2015-09-25 ENCOUNTER — Encounter: Payer: Medicare Other | Admitting: Pain Medicine

## 2015-10-08 ENCOUNTER — Encounter: Payer: Self-pay | Admitting: *Deleted

## 2015-10-08 ENCOUNTER — Telehealth: Payer: Self-pay | Admitting: Pain Medicine

## 2015-10-08 NOTE — Telephone Encounter (Signed)
Patient had surgery and is doing better, wants to hold off on appt for now and will call if she needs to come in for future appts

## 2015-10-08 NOTE — Telephone Encounter (Signed)
Thank you :)

## 2015-10-11 ENCOUNTER — Encounter: Payer: Medicare Other | Admitting: Pain Medicine

## 2015-10-16 ENCOUNTER — Encounter: Payer: Self-pay | Admitting: *Deleted

## 2015-10-17 NOTE — Progress Notes (Signed)
Patient called to discuss when she needed to come back to follow-up with Dr. Oliva Bustard.  She has been recovering from hip surgery.  Talked to Trinda Pascal, RN and she will get with Dr. Oliva Bustard Wednesday to discuss her care.  Hildred Alamin will notified patient of her next appointment.

## 2015-10-18 ENCOUNTER — Other Ambulatory Visit: Payer: Self-pay | Admitting: *Deleted

## 2015-10-18 DIAGNOSIS — C773 Secondary and unspecified malignant neoplasm of axilla and upper limb lymph nodes: Principal | ICD-10-CM

## 2015-10-18 DIAGNOSIS — C50912 Malignant neoplasm of unspecified site of left female breast: Secondary | ICD-10-CM

## 2015-11-01 ENCOUNTER — Ambulatory Visit: Payer: Medicare Other

## 2015-11-08 ENCOUNTER — Ambulatory Visit: Admission: RE | Admit: 2015-11-08 | Payer: Medicare Other | Source: Ambulatory Visit

## 2015-11-13 ENCOUNTER — Inpatient Hospital Stay: Payer: Medicare Other | Attending: Oncology

## 2015-11-13 ENCOUNTER — Ambulatory Visit
Admission: RE | Admit: 2015-11-13 | Discharge: 2015-11-13 | Disposition: A | Discharge status: Home / Self Care | Payer: Medicare Other | Source: Ambulatory Visit | Attending: Oncology | Admitting: Oncology

## 2015-11-13 ENCOUNTER — Inpatient Hospital Stay (HOSPITAL_BASED_OUTPATIENT_CLINIC_OR_DEPARTMENT_OTHER): Payer: Medicare Other | Admitting: Oncology

## 2015-11-13 ENCOUNTER — Encounter: Payer: Self-pay | Admitting: Oncology

## 2015-11-13 VITALS — BP 132/87 | HR 67 | Temp 96.9°F | Resp 18 | Wt 149.3 lb

## 2015-11-13 DIAGNOSIS — Z85841 Personal history of malignant neoplasm of brain: Secondary | ICD-10-CM | POA: Diagnosis not present

## 2015-11-13 DIAGNOSIS — C773 Secondary and unspecified malignant neoplasm of axilla and upper limb lymph nodes: Secondary | ICD-10-CM | POA: Diagnosis present

## 2015-11-13 DIAGNOSIS — Z79899 Other long term (current) drug therapy: Secondary | ICD-10-CM | POA: Diagnosis not present

## 2015-11-13 DIAGNOSIS — Z87891 Personal history of nicotine dependence: Secondary | ICD-10-CM

## 2015-11-13 DIAGNOSIS — Z171 Estrogen receptor negative status [ER-]: Secondary | ICD-10-CM

## 2015-11-13 DIAGNOSIS — C50912 Malignant neoplasm of unspecified site of left female breast: Secondary | ICD-10-CM | POA: Diagnosis not present

## 2015-11-13 DIAGNOSIS — H9193 Unspecified hearing loss, bilateral: Secondary | ICD-10-CM

## 2015-11-13 DIAGNOSIS — Z923 Personal history of irradiation: Secondary | ICD-10-CM | POA: Diagnosis not present

## 2015-11-13 DIAGNOSIS — Z136 Encounter for screening for cardiovascular disorders: Secondary | ICD-10-CM | POA: Insufficient documentation

## 2015-11-13 LAB — COMPREHENSIVE METABOLIC PANEL
ALBUMIN: 4.3 g/dL (ref 3.5–5.0)
ALT: 16 U/L (ref 14–54)
ANION GAP: 7 (ref 5–15)
AST: 20 U/L (ref 15–41)
Alkaline Phosphatase: 59 U/L (ref 38–126)
BUN: 17 mg/dL (ref 6–20)
CO2: 27 mmol/L (ref 22–32)
Calcium: 8.8 mg/dL — ABNORMAL LOW (ref 8.9–10.3)
Chloride: 103 mmol/L (ref 101–111)
Creatinine, Ser: 0.73 mg/dL (ref 0.44–1.00)
GFR calc Af Amer: >60 mL/min (ref >60)
GFR calc non Af Amer: >60 mL/min (ref >60)
GLUCOSE: 98 mg/dL (ref 65–99)
POTASSIUM: 4 mmol/L (ref 3.5–5.1)
SODIUM: 137 mmol/L (ref 135–145)
Total Bilirubin: 0.5 mg/dL (ref 0.3–1.2)
Total Protein: 6.6 g/dL (ref 6.5–8.1)

## 2015-11-13 LAB — CBC WITH DIFFERENTIAL/PLATELET
BASOS ABS: 0 10*3/uL (ref 0–0.1)
BASOS PCT: 1 %
EOS ABS: 0.2 10*3/uL (ref 0–0.7)
Eosinophils Relative: 4 %
HEMATOCRIT: 36.5 % (ref 35.0–47.0)
HEMOGLOBIN: 12.2 g/dL (ref 12.0–16.0)
Lymphocytes Relative: 19 %
Lymphs Abs: 0.9 10*3/uL — ABNORMAL LOW (ref 1.0–3.6)
MCH: 29.6 pg (ref 26.0–34.0)
MCHC: 33.5 g/dL (ref 32.0–36.0)
MCV: 88.3 fL (ref 80.0–100.0)
MONO ABS: 0.5 10*3/uL (ref 0.2–0.9)
MONOS PCT: 10 %
NEUTROS ABS: 3.1 10*3/uL (ref 1.4–6.5)
NEUTROS PCT: 66 %
Platelets: 279 10*3/uL (ref 150–440)
RBC: 4.13 MIL/uL (ref 3.80–5.20)
RDW: 15.4 % — AB (ref 11.5–14.5)
WBC: 4.7 10*3/uL (ref 3.6–11.0)

## 2015-11-13 LAB — MAGNESIUM: Magnesium: 2 mg/dL (ref 1.7–2.4)

## 2015-11-13 MED ORDER — TECHNETIUM TC 99M-LABELED RED BLOOD CELLS IV KIT
20.0000 | PACK | Freq: Once | INTRAVENOUS | Status: AC | PRN
  Administered 2015-11-13: 21.73 via INTRAVENOUS

## 2015-11-13 NOTE — Progress Notes (Signed)
Patient states she needs her port flushed.

## 2015-11-14 ENCOUNTER — Inpatient Hospital Stay: Payer: Medicare Other

## 2015-11-14 ENCOUNTER — Encounter: Payer: Self-pay | Admitting: Oncology

## 2015-11-14 VITALS — BP 115/73 | HR 74 | Temp 97.0°F | Resp 18

## 2015-11-14 DIAGNOSIS — C50912 Malignant neoplasm of unspecified site of left female breast: Secondary | ICD-10-CM | POA: Diagnosis not present

## 2015-11-14 DIAGNOSIS — C773 Secondary and unspecified malignant neoplasm of axilla and upper limb lymph nodes: Principal | ICD-10-CM

## 2015-11-14 MED ORDER — TRASTUZUMAB CHEMO INJECTION 440 MG
6.0000 mg/kg | Freq: Once | INTRAVENOUS | Status: AC
  Administered 2015-11-14: 399 mg via INTRAVENOUS
  Filled 2015-11-14: qty 19

## 2015-11-14 MED ORDER — SODIUM CHLORIDE 0.9 % IV SOLN
Freq: Once | INTRAVENOUS | Status: AC
  Administered 2015-11-14: 15:00:00 via INTRAVENOUS
  Filled 2015-11-14: qty 1000

## 2015-11-14 MED ORDER — HEPARIN SOD (PORK) LOCK FLUSH 100 UNIT/ML IV SOLN
500.0000 [IU] | Freq: Once | INTRAVENOUS | Status: AC | PRN
  Administered 2015-11-14: 500 [IU]
  Filled 2015-11-14: qty 5

## 2015-11-14 MED ORDER — ACETAMINOPHEN 325 MG PO TABS
650.0000 mg | ORAL_TABLET | Freq: Once | ORAL | Status: AC
Start: 2015-11-14 — End: 2015-11-14
  Administered 2015-11-14: 650 mg via ORAL
  Filled 2015-11-14: qty 2

## 2015-11-14 MED ORDER — SODIUM CHLORIDE 0.9 % IJ SOLN
10.0000 mL | INTRAMUSCULAR | Status: DC | PRN
  Administered 2015-11-14: 10 mL
  Filled 2015-11-14: qty 10

## 2015-11-14 NOTE — Progress Notes (Signed)
[place] @ [facility] [phone]  Fax:([phone] Borden Almendariz OB: 11-05-1940  MR#: [id number]  OIZ#:[id number]  Patient Care Team: [name], [name], MD (General Surgery) Self Requested  CHIEF COMPLAINT:  Chief Complaint  Patient presents with  . Breast Cancer   Oncology History   1.  Abnormal mammogram of the left breast.  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  Progesterone receptor negative.  HER-2/neu receptor positive 2.  Patient is starting [facility] from July OF 2016   3.  Patient received lost [facility] chemotherapy on May 16, 2015 4.  She  will be evaluated by radiation oncologist and will continue Herceptin maintenance therapy 5.  Radiation therapy has been started in October of 2016  patient will finish radiation therapy in December of 2016 (December 27 )  Herceptin would be put on hold because of low ejection fraction and planned hip surgery ( last treatment given was in December 21 st  2016 ) 6.Status post hip surgery in in February of 2017.  Patient has improvement in ejection fraction to 61% and has been started on Herceptin from April of 2017   VISIT DIAGNOSIS:   Carcinoma of left breast    Oncology Flowsheet 04/04/2015 04/25/2015 05/16/2015 06/06/2015 06/27/2015 07/18/2015 09/04/2015  Day, Cycle Day 1, Cycle 4 Day 1, Cycle 5 Day 1, Cycle 6 - - - -  CARBOplatin (PARAPLATIN) IV 470 mg 470 mg 470 mg - - - -  dexamethasone (DECADRON) IJ - - - - - - -  dexamethasone (DECADRON) IV [ 12 mg ] [ 12 mg ] [ 12 mg ] - - - 10 mg  diphenhydrAMINE (BENADRYL) PO - - - - - - -  DOCEtaxel (TAXOTERE) IV 70 mg/m2 70 mg/m2 70 mg/m2 - - - -  fosaprepitant (EMEND) IV [ 150 mg ] [ 150 mg ] [ 150 mg ] - - - -    ondansetron (ZOFRAN) IV - - - - - - -  palonosetron (ALOXI) IV 0.25 mg 0.25 mg 0.25 mg - - - -  pegfilgrastim (NEULASTA ONPRO KIT)  6 mg 6 mg 6 mg - - - -  trastuzumab (HERCEPTIN) IV 6 mg/kg 6 mg/kg 6 mg/kg 6 mg/kg 6 mg/kg 6 mg/kg -    INTERVAL HISTORY:  75 year old lady who had a regular mammogram recent mammogram with abnormal ultrasound was done.  There were 3 nodules were found.  Patient underwent stereotactic ultrasound-guided biopsy.  3 nodules were 4 cm 5 cm in 6 cm away from nipple.  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  Has been off all narcotic pain medication. Has some dull aching pain but not very severe sharp pain that she had before.  Patient had a ejection fraction done by MUGA scan of the heart and it is 61%.  Patient is here for consideration of resuming Herceptin therapy Review of systems GENERAL:  Feels good.  Active.  No fevers, sweats or weight loss. PERFORMANCE STATUS (ECOG):  01 HEENT:  No visual changes, runny nose, sore throat, mouth sores or tenderness. Lungs: No shortness of breath or cough.  No hemoptysis.  Cardiac:  No chest pain, palpitations, orthopnea, or PND. GI:  No nausea, vomiting, diarrhea, constipation, melena or hematochezia. GU:  No urgency, frequency, dysuria, or hematuria. Musculoskeletal:  Status post hip surgery with significant improvement in pain Extremities:  No pain or swelling. Skin:  No rashes or skin changes. Neuro:  No headache, numbness or weakness, balance or coordination issues. Endocrine:  No diabetes, thyroid issues, hot flashes or night sweats. Psych:  No mood changes, depression or anxiety. Pain:  No focal pain. Review of systems:  All other systems reviewed and found to be negative.   Ration is here for ongoing evaluation and continuation of treatment.  No shortness of breath no chest  pain. PAST MEDICAL HISTORY: Past Medical History  Diagnosis Date  . 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 Hearing loss   . Memory impairment     seen by Dr Manuella Ghazi  possible post crainiotomy from radiation  . 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  . History of radiation therapy   . Wears glasses   . Numbness and tingling     right hand   . Cataract     bilat   . Hard of hearing     wears hearing aides bilat   . Imbalance   . Arthritis   . History of cancer chemotherapy     PAST SURGICAL HISTORY: Past Surgical History  Procedure Laterality Date  . Appendectomy  1950  . Brain surgery  1995    left frontal/temporal  . Colonoscopy  2010    Dr. [name]  . Breast biopsy Left 1997  . 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 Breast biopsy Left 01/08/2015    Procedure: BREAST BIOPSY WITH NEEDLE LOCALIZATION;  Surgeon: [name], MD;  Location: [facility] ORS;  Service: General;  Laterality: Left;  . Breast lumpectomy Left 01/08/2015    Procedure: LUMPECTOMY;  Surgeon: [name], MD;  Location: [facility] ORS;  Service: General;  Laterality: Left;  . Sentinel node biopsy Left 01/16/2015    Procedure: SENTINEL NODE BIOPSY;  Surgeon: [name], MD;  Location: [facility] ORS;  Service: General;  Laterality: Left;  . Portacath placement Right 01/16/2015    Procedure: INSERTION PORT-A-CATH;  Surgeon: [name], MD;  Location: [facility] ORS;  Service: General;  Laterality: Right;  . Total hip arthroplasty Right 09/04/2015    Procedure: RIGHT TOTAL HIP ARTHROPLASTY ANTERIOR APPROACH;  Surgeon: [name], MD;  Location: [facility] ORS;  Service: Orthopedics;  Laterality: Right;    FAMILY HISTORY Family History  Problem Relation Age of Onset  . Breast cancer Sister 20  . Addison's disease Mother   . Stroke Father            HEALTH  MAINTENANCE: Social History  Substance Use Topics  . Smoking status: Former Smoker -- 0.25 packs/day for 5 years    Types: Cigarettes    Quit date: 07/28/1972  . Smokeless tobacco: Never Used  . Alcohol Use: 0.6 - 1.2 oz/week    1-2 Glasses of wine per week     Comment: 1 Glass Wine / Night      Allergies  Allergen Reactions  . Donepezil     Other reaction(s): Other (See Comments) Nightmares    Current Outpatient Prescriptions  Medication Sig Dispense Refill  . Ascorbic Acid (  VITAMIN C) 100 MG tablet Take 100 mg by mouth every morning.     . Calcium Carbonate-Vit D-Min (CALTRATE 600+D PLUS MINERALS) 600-800 MG-UNIT TABS Take 1 tablet by mouth every morning.    . ferrous sulfate 325 (65 FE) MG tablet Take 1 tablet (325 mg total) by mouth 3 (three) times daily after meals.  3  . lidocaine-prilocaine (EMLA) cream Apply 1 application topically as needed. 30 g 3  . loratadine (CLARITIN) 10 MG tablet Take 10 mg by mouth 2 (two) times daily.    . polyethylene glycol (MIRALAX / GLYCOLAX) packet Take 17 g by mouth 2 (two) times daily. 14 each 0  . polyvinyl alcohol (LIQUIFILM TEARS) 1.4 % ophthalmic solution Place 1 drop into both eyes 3 (three) times daily as needed for dry eyes.    Marland Kitchen psyllium (REGULOID) 0.52 g capsule Take 0.52 g by mouth daily.    . traMADol (ULTRAM) 50 MG tablet      No current facility-administered medications for this visit.    OBJECTIVE: GENERAL:  Well developed, well nourished, sitting comfortably in the exam room in no acute distress. Fatigue  grade 1 MENTAL STATUS:  Alert and oriented to person, place and time.  ENT: No evidence of stomatitisI.  Alopecia  RESPIRATORY:  Clear to auscultation without rales, wheezes or rhonchi. CARDIOVASCULAR:  Regular rate and rhythm without murmur, rub or gallop. Port site within normal limit ABDOMEN:  Soft, non-tender, with active bowel sounds, and no hepatosplenomegaly.  No masses. BACK:  No CVA tenderness.  No  tenderness on percussion of the back or rib cage. SKIN:  No rashes, ulcers or lesions. EXTREMITIES: No edema, no skin discoloration or tenderness.  No palpable cords. LYMPH NODES: No palpable cervical, supraclavicular, axillary or inguinal adenopathy  NEUROLOGICAL: Unremarkable. PSYCH:  Appropriate.  Filed Vitals:   11/13/15 1218  BP: 132/87  Pulse: 67  Temp: 96.9 F (36.1 C)  Resp: 18     Body mass index is 25.23 kg/(m^2).    ECOG FS:1 - Symptomatic but completely ambulatory  LAB RESULTS:  Appointment on 11/13/2015  Component Date Value Ref Range Status  . WBC 11/13/2015 4.7  3.6 - 11.0 K/uL Final  . RBC 11/13/2015 4.13  3.80 - 5.20 MIL/uL Final  . Hemoglobin 11/13/2015 12.2  12.0 - 16.0 g/dL Final  . HCT 11/13/2015 36.5  35.0 - 47.0 % Final  . MCV 11/13/2015 88.3  80.0 - 100.0 fL Final  . MCH 11/13/2015 29.6  26.0 - 34.0 pg Final  . MCHC 11/13/2015 33.5  32.0 - 36.0 g/dL Final  . RDW 11/13/2015 15.4* 11.5 - 14.5 % Final  . Platelets 11/13/2015 279  150 - 440 K/uL Final  . Neutrophils Relative % 11/13/2015 66   Final  . Neutro Abs 11/13/2015 3.1  1.4 - 6.5 K/uL Final  . Lymphocytes Relative 11/13/2015 19   Final  . Lymphs Abs 11/13/2015 0.9* 1.0 - 3.6 K/uL Final  . Monocytes Relative 11/13/2015 10   Final  . Monocytes Absolute 11/13/2015 0.5  0.2 - 0.9 K/uL Final  . Eosinophils Relative 11/13/2015 4   Final  . Eosinophils Absolute 11/13/2015 0.2  0 - 0.7 K/uL Final  . Basophils Relative 11/13/2015 1   Final  . Basophils Absolute 11/13/2015 0.0  0 - 0.1 K/uL Final  . Sodium 11/13/2015 137  135 - 145 mmol/L Final  . Potassium 11/13/2015 4.0  3.5 - 5.1 mmol/L Final  . Chloride 11/13/2015 103  101 - 111  mmol/L Final  . CO2 11/13/2015 27  22 - 32 mmol/L Final  . Glucose, Bld 11/13/2015 98  65 - 99 mg/dL Final  . BUN 11/13/2015 17  6 - 20 mg/dL Final  . Creatinine, Ser 11/13/2015 0.73  0.44 - 1.00 mg/dL Final  . Calcium 11/13/2015 8.8* 8.9 - 10.3 mg/dL Final  . Total Protein  11/13/2015 6.6  6.5 - 8.1 g/dL Final  . Albumin 11/13/2015 4.3  3.5 - 5.0 g/dL Final  . AST 11/13/2015 20  15 - 41 U/L Final  . ALT 11/13/2015 16  14 - 54 U/L Final  . Alkaline Phosphatase 11/13/2015 59  38 - 126 U/L Final  . Total Bilirubin 11/13/2015 0.5  0.3 - 1.2 mg/dL Final  . GFR calc non Af Amer 11/13/2015 >60  >60 mL/min Final  . GFR calc Af Amer 11/13/2015 >60  >60 mL/min Final   Comment: (NOTE) The eGFR has been calculated using the CKD EPI equation. This calculation has not been validated in all clinical situations. eGFR's persistently <60 mL/min signify possible Chronic Kidney Disease.   . Anion gap 11/13/2015 7  5 - 15 Final  . Magnesium 11/13/2015 2.0  1.7 - 2.4 mg/dL Final     ASSESSMENT:  3 -year-old lady with a history of carcinoma breast.   T1c N1 C micro-M0  Status   post hip surgery. Proceed with Herceptin therapy.  MUGA scan of the heart has been reviewed improvement in ejection fraction to 61%. Repeat ejection fraction after  total 4 cycles of Herceptin therapy All the previous record of hip surgery has been evaluated [name], MD   11/14/2015 7:45 AM

## 2015-12-05 ENCOUNTER — Inpatient Hospital Stay (HOSPITAL_BASED_OUTPATIENT_CLINIC_OR_DEPARTMENT_OTHER): Payer: Medicare Other | Admitting: Oncology

## 2015-12-05 ENCOUNTER — Inpatient Hospital Stay: Payer: Medicare Other

## 2015-12-05 ENCOUNTER — Inpatient Hospital Stay: Payer: Medicare Other | Attending: Oncology

## 2015-12-05 VITALS — BP 126/77 | HR 80 | Temp 96.7°F | Resp 18 | Wt 149.1 lb

## 2015-12-05 DIAGNOSIS — Z85841 Personal history of malignant neoplasm of brain: Secondary | ICD-10-CM

## 2015-12-05 DIAGNOSIS — M1990 Unspecified osteoarthritis, unspecified site: Secondary | ICD-10-CM | POA: Insufficient documentation

## 2015-12-05 DIAGNOSIS — C773 Secondary and unspecified malignant neoplasm of axilla and upper limb lymph nodes: Principal | ICD-10-CM

## 2015-12-05 DIAGNOSIS — Z9221 Personal history of antineoplastic chemotherapy: Secondary | ICD-10-CM | POA: Insufficient documentation

## 2015-12-05 DIAGNOSIS — Z923 Personal history of irradiation: Secondary | ICD-10-CM | POA: Insufficient documentation

## 2015-12-05 DIAGNOSIS — Z171 Estrogen receptor negative status [ER-]: Secondary | ICD-10-CM

## 2015-12-05 DIAGNOSIS — C50212 Malignant neoplasm of upper-inner quadrant of left female breast: Secondary | ICD-10-CM | POA: Diagnosis not present

## 2015-12-05 DIAGNOSIS — Z5112 Encounter for antineoplastic immunotherapy: Secondary | ICD-10-CM | POA: Insufficient documentation

## 2015-12-05 DIAGNOSIS — C50912 Malignant neoplasm of unspecified site of left female breast: Secondary | ICD-10-CM

## 2015-12-05 LAB — CBC WITH DIFFERENTIAL/PLATELET
BASOS PCT: 1 %
Basophils Absolute: 0 10*3/uL (ref 0–0.1)
EOS ABS: 0.1 10*3/uL (ref 0–0.7)
Eosinophils Relative: 2 %
HCT: 38.8 % (ref 35.0–47.0)
HEMOGLOBIN: 13.1 g/dL (ref 12.0–16.0)
Lymphocytes Relative: 13 %
Lymphs Abs: 1.1 10*3/uL (ref 1.0–3.6)
MCH: 29.8 pg (ref 26.0–34.0)
MCHC: 33.8 g/dL (ref 32.0–36.0)
MCV: 88.2 fL (ref 80.0–100.0)
MONOS PCT: 9 %
Monocytes Absolute: 0.7 10*3/uL (ref 0.2–0.9)
NEUTROS PCT: 75 %
Neutro Abs: 5.9 10*3/uL (ref 1.4–6.5)
PLATELETS: 319 10*3/uL (ref 150–440)
RBC: 4.4 MIL/uL (ref 3.80–5.20)
RDW: 16 % — ABNORMAL HIGH (ref 11.5–14.5)
WBC: 7.8 10*3/uL (ref 3.6–11.0)

## 2015-12-05 LAB — COMPREHENSIVE METABOLIC PANEL
ALBUMIN: 4.6 g/dL (ref 3.5–5.0)
ALK PHOS: 64 U/L (ref 38–126)
ALT: 20 U/L (ref 14–54)
ANION GAP: 7 (ref 5–15)
AST: 23 U/L (ref 15–41)
BUN: 19 mg/dL (ref 6–20)
CALCIUM: 8.6 mg/dL — AB (ref 8.9–10.3)
CHLORIDE: 102 mmol/L (ref 101–111)
CO2: 25 mmol/L (ref 22–32)
Creatinine, Ser: 0.72 mg/dL (ref 0.44–1.00)
GFR calc Af Amer: >60 mL/min (ref >60)
GFR calc non Af Amer: >60 mL/min (ref >60)
GLUCOSE: 124 mg/dL — AB (ref 65–99)
Potassium: 3.8 mmol/L (ref 3.5–5.1)
SODIUM: 134 mmol/L — AB (ref 135–145)
Total Bilirubin: 0.5 mg/dL (ref 0.3–1.2)
Total Protein: 6.9 g/dL (ref 6.5–8.1)

## 2015-12-05 MED ORDER — TRASTUZUMAB CHEMO INJECTION 440 MG
6.0000 mg/kg | Freq: Once | INTRAVENOUS | Status: AC
  Administered 2015-12-05: 399 mg via INTRAVENOUS
  Filled 2015-12-05: qty 19

## 2015-12-05 MED ORDER — HEPARIN SOD (PORK) LOCK FLUSH 100 UNIT/ML IV SOLN
500.0000 [IU] | Freq: Once | INTRAVENOUS | Status: AC
  Administered 2015-12-05: 500 [IU] via INTRAVENOUS
  Filled 2015-12-05: qty 5

## 2015-12-05 MED ORDER — ACETAMINOPHEN 325 MG PO TABS
650.0000 mg | ORAL_TABLET | Freq: Once | ORAL | Status: AC
  Administered 2015-12-05: 650 mg via ORAL
  Filled 2015-12-05: qty 2

## 2015-12-05 MED ORDER — SODIUM CHLORIDE 0.9 % IV SOLN
Freq: Once | INTRAVENOUS | Status: AC
  Administered 2015-12-05: 15:00:00 via INTRAVENOUS
  Filled 2015-12-05: qty 1000

## 2015-12-05 MED ORDER — SODIUM CHLORIDE 0.9% FLUSH
10.0000 mL | INTRAVENOUS | Status: DC | PRN
  Administered 2015-12-05: 10 mL via INTRAVENOUS
  Filled 2015-12-05: qty 10

## 2015-12-10 ENCOUNTER — Encounter: Payer: Self-pay | Admitting: Oncology

## 2015-12-10 ENCOUNTER — Ambulatory Visit
Admission: RE | Admit: 2015-12-10 | Discharge: 2015-12-10 | Disposition: A | Discharge status: Home / Self Care | Payer: Medicare Other | Source: Ambulatory Visit | Attending: General Surgery | Admitting: General Surgery

## 2015-12-10 ENCOUNTER — Other Ambulatory Visit: Payer: Self-pay | Admitting: General Surgery

## 2015-12-10 DIAGNOSIS — Z923 Personal history of irradiation: Secondary | ICD-10-CM | POA: Diagnosis not present

## 2015-12-10 DIAGNOSIS — C50212 Malignant neoplasm of upper-inner quadrant of left female breast: Secondary | ICD-10-CM

## 2015-12-10 DIAGNOSIS — Z853 Personal history of malignant neoplasm of breast: Secondary | ICD-10-CM | POA: Insufficient documentation

## 2015-12-10 NOTE — Progress Notes (Signed)
[place] @ [facility] [phone]  Fax:([phone]) Bevier Gladd OB: 12/07/1940  MR#: [id number]  CXF#:[id number]  Patient Care Team: [name], [name], MD (General Surgery) Self Requested  CHIEF COMPLAINT:  Chief Complaint  Patient presents with  . Breast Cancer   Oncology History   1.  Abnormal mammogram of the left breast.  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  Progesterone receptor negative.  HER-2/neu receptor positive 2.  Patient is starting [facility] from July OF 2016   3.  Patient received last [facility] chemotherapy on May 16, 2015 4.  She  will be evaluated by radiation oncologist and will continue Herceptin maintenance therapy 5.  Radiation therapy has been started in October of 2016  patient will finish radiation therapy in December of 2016 (December 27 )  Herceptin would be put on hold because of low ejection fraction and planned hip surgery ( last treatment given was in December 21 st  2016 ) 6.Status post hip surgery in in February of 2017.  Patient has improvement in ejection fraction to 61% and has been started on Herceptin from April of 2017   VISIT DIAGNOSIS:   Carcinoma of left breast      INTERVAL HISTORY:  75 year old lady who had a regular mammogram recent mammogram with abnormal ultrasound was done.  There were 3 nodules were found.  Patient underwent stereotactic ultrasound-guided biopsy.  3 nodules were 4 cm 5 cm in 6 cm away from nipple.  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  Has been off all narcotic pain medication. Has some dull aching pain but  not very severe sharp pain that she had before.  Patient had a ejection fraction done by MUGA scan of the heart and it is 61%.  Patient is here for consideration of resuming Herceptin therapy Review of systems  Patient has developed some pain in the left hip now been being treated with steroid injection. Tolerating treatment very well without any shortness of breath appetite has been stable GENERAL:  Feels good.  Active.  No fevers, sweats or weight loss. PERFORMANCE STATUS (ECOG):  01 HEENT:  No visual changes, runny nose, sore throat, mouth sores or tenderness. Lungs: No shortness of breath or cough.  No hemoptysis. Cardiac:  No chest pain, palpitations, orthopnea, or PND. GI:  No nausea, vomiting, diarrhea, constipation, melena or hematochezia. GU:  No urgency, frequency, dysuria, or hematuria. Musculoskeletal:  Status post hip surgery with significant improvement in pain Extremities:  No pain or swelling. Skin:  No rashes or skin changes. Neuro:  No headache, numbness or weakness, balance or coordination issues. Endocrine:  No diabetes, thyroid issues, hot flashes or night sweats. Psych:  No mood changes, depression or anxiety. Pain:  No focal pain. Review of systems:  All other systems reviewed and found to be negative.   Ration is here for ongoing evaluation and continuation of treatment.  No shortness of breath no chest pain. PAST MEDICAL HISTORY: Past Medical History  Diagnosis Date  . 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 Hearing loss   . Memory impairment  seen by Dr Manuella Ghazi  possible post crainiotomy from radiation  . Breast cancer of upper-inner quadrant of left female breast ([place]) 12/15/14    Left breast invasive mammary carcinoma, T1c (1.5 cm) ER negative, [name], [name].  . History of radiation therapy   . Wears glasses   . Numbness and tingling     right hand   . Cataract     bilat   . Hard of hearing     wears hearing aides bilat   . Imbalance     . Arthritis   . History of cancer chemotherapy     PAST SURGICAL HISTORY: Past Surgical History  Procedure Laterality Date  . Appendectomy  1950  . Brain surgery  1995    left frontal/temporal  . Colonoscopy  2010    Dr. [name]  . Breast biopsy Left 1997  . 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 Breast biopsy Left 01/08/2015    Procedure: BREAST BIOPSY WITH NEEDLE LOCALIZATION;  Surgeon: [name], MD;  Location: [facility] ORS;  Service: General;  Laterality: Left;  . Breast lumpectomy Left 01/08/2015    Procedure: LUMPECTOMY;  Surgeon: [name], MD;  Location: [facility] ORS;  Service: General;  Laterality: Left;  . Sentinel node biopsy Left 01/16/2015    Procedure: SENTINEL NODE BIOPSY;  Surgeon: [name], MD;  Location: [facility] ORS;  Service: General;  Laterality: Left;  . Portacath placement Right 01/16/2015    Procedure: INSERTION PORT-A-CATH;  Surgeon: [name], MD;  Location: [facility] ORS;  Service: General;  Laterality: Right;  . Total hip arthroplasty Right 09/04/2015    Procedure: RIGHT TOTAL HIP ARTHROPLASTY ANTERIOR APPROACH;  Surgeon: [name], MD;  Location: [facility];  Service: Orthopedics;  Laterality: Right;    FAMILY HISTORY Family History  Problem Relation Age of Onset  . Breast cancer Sister 49  . Addison's disease Mother   . Stroke Father            HEALTH MAINTENANCE: Social History  Substance Use Topics  . Smoking status: Former Smoker -- 0.25 packs/day for 5 years    Types: Cigarettes    Quit date: 07/28/1972  . Smokeless tobacco: Never Used  . Alcohol Use: 0.6 - 1.2 oz/week    1-2 Glasses of wine per week     Comment: 1 Glass Wine / Night      Allergies  Allergen Reactions  . Donepezil     Other reaction(s): Other (See Comments) Nightmares    Current Outpatient Prescriptions  Medication Sig Dispense Refill  . Ascorbic Acid (VITAMIN C) 100 MG tablet Take 100 mg by mouth every morning.     . Calcium  Carbonate-Vit D-Min (CALTRATE 600+D PLUS MINERALS) 600-800 MG-UNIT TABS Take 1 tablet by mouth every morning.    . ferrous sulfate 325 (65 FE) MG tablet Take 1 tablet (325 mg total) by mouth 3 (three) times daily after meals.  3  . lidocaine-prilocaine (EMLA) cream Apply 1 application topically as needed. 30 g 3  . loratadine (CLARITIN) 10 MG tablet Take 10 mg by mouth 2 (two) times daily.    . polyethylene glycol (MIRALAX / GLYCOLAX) packet Take 17 g by mouth 2 (two) times daily. 14 each 0  . polyvinyl alcohol (LIQUIFILM TEARS) 1.4 % ophthalmic solution Place 1 drop into both eyes 3 (three) times daily as needed for dry eyes.    . traMADol (ULTRAM) 50 MG tablet     .  meloxicam (MOBIC) 15 MG tablet      No current facility-administered medications for this visit.   Facility-Administered Medications Ordered in Other Visits  Medication Dose Route Frequency Provider Last Rate Last Dose  . heparin lock flush 100 unit/mL  500 Units Intravenous Once [name], MD      . sodium chloride flush (NS) 0.9 % injection 10 mL  10 mL Intravenous PRN [name], MD   10 mL at 12/05/15 1335    OBJECTIVE: GENERAL:  Well developed, well nourished, sitting comfortably in the exam room in no acute distress. Fatigue  grade 1 MENTAL STATUS:  Alert and oriented to person, place and time.  ENT: No evidence of stomatitisI.  Alopecia  RESPIRATORY:  Clear to auscultation without rales, wheezes or rhonchi. CARDIOVASCULAR:  Regular rate and rhythm without murmur, rub or gallop. Port site within normal limit ABDOMEN:  Soft, non-tender, with active bowel sounds, and no hepatosplenomegaly.  No masses. BACK:  No CVA tenderness.  No tenderness on percussion of the back or rib cage. SKIN:  No rashes, ulcers or lesions. EXTREMITIES: No edema, no skin discoloration or tenderness.  No palpable cords. LYMPH NODES: No palpable cervical, supraclavicular, axillary or inguinal adenopathy  NEUROLOGICAL: Unremarkable. PSYCH:   Appropriate.  Filed Vitals:   12/05/15 1350  BP: 126/77  Pulse: 80  Temp: 96.7 F (35.9 C)  Resp: 18     Body mass index is 25.2 kg/(m^2).    ECOG FS:1 - Symptomatic but completely ambulatory  LAB RESULTS:  Infusion on 12/05/2015  Component Date Value Ref Range Status  . WBC 12/05/2015 7.8  3.6 - 11.0 K/uL Final  . RBC 12/05/2015 4.40  3.80 - 5.20 MIL/uL Final  . Hemoglobin 12/05/2015 13.1  12.0 - 16.0 g/dL Final  . HCT 12/05/2015 38.8  35.0 - 47.0 % Final  . MCV 12/05/2015 88.2  80.0 - 100.0 fL Final  . MCH 12/05/2015 29.8  26.0 - 34.0 pg Final  . MCHC 12/05/2015 33.8  32.0 - 36.0 g/dL Final  . RDW 12/05/2015 16.0* 11.5 - 14.5 % Final  . Platelets 12/05/2015 319  150 - 440 K/uL Final  . Neutrophils Relative % 12/05/2015 75   Final  . Neutro Abs 12/05/2015 5.9  1.4 - 6.5 K/uL Final  . Lymphocytes Relative 12/05/2015 13   Final  . Lymphs Abs 12/05/2015 1.1  1.0 - 3.6 K/uL Final  . Monocytes Relative 12/05/2015 9   Final  . Monocytes Absolute 12/05/2015 0.7  0.2 - 0.9 K/uL Final  . Eosinophils Relative 12/05/2015 2   Final  . Eosinophils Absolute 12/05/2015 0.1  0 - 0.7 K/uL Final  . Basophils Relative 12/05/2015 1   Final  . Basophils Absolute 12/05/2015 0.0  0 - 0.1 K/uL Final  . Sodium 12/05/2015 134* 135 - 145 mmol/L Final  . Potassium 12/05/2015 3.8  3.5 - 5.1 mmol/L Final  . Chloride 12/05/2015 102  101 - 111 mmol/L Final  . CO2 12/05/2015 25  22 - 32 mmol/L Final  . Glucose, Bld 12/05/2015 124* 65 - 99 mg/dL Final  . BUN 12/05/2015 19  6 - 20 mg/dL Final  . Creatinine, Ser 12/05/2015 0.72  0.44 - 1.00 mg/dL Final  . Calcium 12/05/2015 8.6* 8.9 - 10.3 mg/dL Final  . Total Protein 12/05/2015 6.9  6.5 - 8.1 g/dL Final  . Albumin 12/05/2015 4.6  3.5 - 5.0 g/dL Final  . AST 12/05/2015 23  15 - 41 U/L Final  . ALT  12/05/2015 20  14 - 54 U/L Final  . Alkaline Phosphatase 12/05/2015 64  38 - 126 U/L Final  . Total Bilirubin 12/05/2015 0.5  0.3 - 1.2 mg/dL Final  . GFR  calc non Af Amer 12/05/2015 >60  >60 mL/min Final  . GFR calc Af Amer 12/05/2015 >60  >60 mL/min Final   Comment: (NOTE) The eGFR has been calculated using the CKD EPI equation. This calculation has not been validated in all clinical situations. eGFR's persistently <60 mL/min signify possible Chronic Kidney Disease.   . Anion gap 12/05/2015 7  5 - 15 Final     ASSESSMENT:  29 -year-old lady with a history of carcinoma breast.   T1c N1 C micro-M0  Status   post hip surgery. Proceed with Herceptin therapy.  MUGA scan of the heart has been reviewed improvement in ejection fraction to 61%. Repeat ejection fraction after  total 4 cycles of Herceptin therapy All the previous record of hip surgery has been evaluated [name], MD   12/10/2015 7:56 AM

## 2015-12-17 ENCOUNTER — Other Ambulatory Visit: Payer: Self-pay

## 2015-12-17 ENCOUNTER — Ambulatory Visit (INDEPENDENT_AMBULATORY_CARE_PROVIDER_SITE_OTHER): Payer: Medicare Other | Admitting: General Surgery

## 2015-12-17 ENCOUNTER — Encounter: Payer: Self-pay | Admitting: General Surgery

## 2015-12-17 VITALS — BP 130/80 | HR 68 | Resp 14 | Ht 64.0 in | Wt 147.0 lb

## 2015-12-17 DIAGNOSIS — D0512 Intraductal carcinoma in situ of left breast: Secondary | ICD-10-CM | POA: Diagnosis not present

## 2015-12-17 DIAGNOSIS — N644 Mastodynia: Secondary | ICD-10-CM

## 2015-12-17 NOTE — Progress Notes (Addendum)
Patient ID: [name], female   DOB: 1941-05-10, 75 y.o.   MRN: [id number]  Chief Complaint  Patient presents with  . Follow-up    mammogram    HPI [name] is a 75 y.o. female who presents for a breast evaluation. The most recent mammogram was done on 12/10/15. Patient does perform regular self breast checks and gets regular mammograms done. She reports some moderate pain in the left breast with palpation. She is currently still on Herceptin infusions. She is accompanied today by her husband.  The patient completed her radiation at the end of December 2016. She underwent right hip replacement in February with excellent relief of her pain. She has 2 cycles of Herceptin to complete.  I personally reviewed the patient's history.  HPI  Past Medical History  Diagnosis Date  . 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 Hearing loss   . Memory impairment     seen by Dr Manuella Ghazi  possible post crainiotomy from radiation  . History of radiation therapy   . Wears glasses   . Numbness and tingling     right hand   . Cataract     bilat   . Hard of hearing     wears hearing aides bilat   . Imbalance   . Arthritis   . History of cancer chemotherapy   . 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    Past Surgical History  Procedure Laterality Date  . Appendectomy  1950  . Brain surgery  1995    left frontal/temporal  . Colonoscopy  2010    Dr. ETiffany Kocher . Breast lumpectomy Left 01/08/2015    Procedure: LUMPECTOMY;  Surgeon: [name] MD;  Location: [facility] ORS;  Service: General;  Laterality: Left;  . Sentinel node biopsy Left 01/16/2015    Procedure: SENTINEL NODE BIOPSY;  Surgeon: [name] MD;  Location: [facility] ORS;  Service: General;  Laterality: Left;  . Portacath placement Right 01/16/2015     Procedure: INSERTION PORT-A-CATH;  Surgeon: [name] MD;  Location: [facility];  Service: General;  Laterality: Right;  . Total hip arthroplasty Right 09/04/2015    Procedure: RIGHT TOTAL HIP ARTHROPLASTY ANTERIOR APPROACH;  Surgeon: MParalee Cancel MD;  Location: [facility];  Service: Orthopedics;  Laterality: Right;  . Breast biopsy Left 1997  . 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 Breast biopsy Left 01/08/2015    Procedure: BREAST BIOPSY WITH NEEDLE LOCALIZATION;  Surgeon: [name] MD;  Location: [facility];  Service: General;  Laterality: Left;    Family History  Problem Relation Age of Onset  . Breast cancer Sister 475 . Addison's disease Mother   . Stroke Father     Social History Social History  Substance Use Topics  . Smoking status: Former Smoker -- 0.25 packs/day for 5 years    Types: Cigarettes    Quit date: 07/28/1972  . Smokeless tobacco: Never Used  . Alcohol Use: 0.6 - 1.2 oz/week    1-2 Glasses of wine per week     Comment: 1 Glass Wine / Night    Allergies  Allergen Reactions  . Donepezil     Other reaction(s): Other (See Comments) Nightmares    Current Outpatient Prescriptions  Medication  Sig Dispense Refill  . Ascorbic Acid (VITAMIN C) 100 MG tablet Take 100 mg by mouth every morning.     . Calcium Carbonate-Vit D-Min (CALTRATE 600+D PLUS MINERALS) 600-800 MG-UNIT TABS Take 1 tablet by mouth every morning.    . ferrous sulfate 325 (65 FE) MG tablet Take 1 tablet (325 mg total) by mouth 3 (three) times daily after meals.  3  . lidocaine-prilocaine (EMLA) cream Apply 1 application topically as needed. 30 g 3  . loratadine (CLARITIN) 10 MG tablet Take 10 mg by mouth 2 (two) times daily.    . polyethylene glycol (MIRALAX / GLYCOLAX) packet Take 17 g by mouth 2 (two) times daily. 14 each 0  . polyvinyl alcohol (LIQUIFILM TEARS) 1.4 % ophthalmic solution Place 1 drop into both eyes daily.     . traMADol (ULTRAM) 50 MG tablet      No  current facility-administered medications for this visit.    Review of Systems Review of Systems  Constitutional: Negative.   Respiratory: Negative.   Cardiovascular: Negative.     Blood pressure 130/80, pulse 68, resp. rate 14, height 5' 4"  (1.626 m), weight 147 lb (66.679 kg).  Physical Exam Physical Exam  Constitutional: She is oriented to person, place, and time. She appears well-developed and well-nourished.  Eyes: Conjunctivae are normal. No scleral icterus.  Neck: Neck supple.  Cardiovascular: Normal rate, regular rhythm and normal heart sounds.   Pulmonary/Chest: Effort normal and breath sounds normal. Right breast exhibits no inverted nipple, no mass, no nipple discharge, no skin change and no tenderness. Left breast exhibits no inverted nipple, no mass, no nipple discharge, no skin change and no tenderness.    Abdominal: Soft. Bowel sounds are normal. There is no tenderness.  Lymphadenopathy:    She has no cervical adenopathy.    She has no axillary adenopathy.  Neurological: She is alert and oriented to person, place, and time.  Skin: Skin is warm and dry.  Psychiatric: She has a normal mood and affect.    Data Reviewed Bilateral mammograms dated 12/10/2015 were reviewed. Postsurgical changes noted in the breast. No definite evidence of recurrent tumor. BI-RADS-1  Ultrasound examination was completed the area of thickening in the area lateral to the nipple/areolar complex at the 3:00 position. In this area there was significant dermal thickening up to 0.49 cm at a location 2 cm from nipple at the 3:00 position, compared to 0.17 cm at the 8 cm mark. Beneath this skin thickening was an irregular 0.63 x 0.75 x 1.0 hypoechoic area with irregular margins approximately 1 cm below the skin surface. This may have been at the very edge of the mastoplasty dissection, but as it is fully across the areola this would be unlikely.  Below the incision are focal areas of mixed  heterogeneous nodularity suggestive of early fat necrosis measuring up to 2 cm in diameter. This is at the 9:00 position, 6 cm from the scar. BI-RADS-3.   Assessment    T1c, N1 microscopic ER/[name], HER-2/neu positive carcinoma the left breast, dermal changes in the contralateral breast.    Plan    I spoke with Dr. Baruch Gouty from radiation oncology after the patient's visit. The field of boost would not have extended across the nipple areolar complex.  The changes noticed today may be completely related to the combination of surgery and late effects of whole breast radiation but they are of concern. At a minimum we will plan for a reexamination in 3 months.  We will review with the patient's treating oncologist on his return on Wednesday, May 24.   Patient to return in three months left breast ultrasound PCP:  [facility]  This information has been scribed by Gaspar Cola CMA.     [name] 12/18/2015, 3:36 PM

## 2015-12-17 NOTE — Patient Instructions (Addendum)
Patient to return in three months left breast ultrasound

## 2015-12-18 ENCOUNTER — Encounter: Payer: Self-pay | Admitting: General Surgery

## 2015-12-19 ENCOUNTER — Encounter: Payer: Self-pay | Admitting: *Deleted

## 2015-12-19 NOTE — Progress Notes (Signed)
  Oncology Nurse Navigator Documentation  Navigator Location: [facility] (12/19/15 1700) Navigator Encounter Type: Telephone (12/19/15 1700) Telephone: Incoming Call;Outgoing Call (12/19/15 1700)           Treatment Phase: Active Tx (12/19/15 1700) Barriers/Navigation Needs: Coordination of Care (12/19/15 1700)   Interventions: Coordination of Care (12/19/15 1700)            Acuity: Level 2 (12/19/15 1700)         Time Spent with Patient: 30 (12/19/15 1700)   Patient's husband called yesterday to request changing her date of her next appointment for Heceptin.  Called him back today with appointment for June 7th at 1:45.

## 2015-12-21 NOTE — Progress Notes (Signed)
  Oncology Nurse Navigator Documentation  Navigator Location: [facility] (12/21/15 1000) Navigator Encounter Type: Telephone (12/21/15 1000) Telephone: Incoming Call;Appt Confirmation/Clarification (12/21/15 1000)           Treatment Phase: Active Tx (12/21/15 1000) Barriers/Navigation Needs: Coordination of Care (12/21/15 1000)                Acuity: Level 1 (12/21/15 1000) Acuity Level 1: Minimal follow up required (12/21/15 1000)       Time Spent with Patient: 15 (12/21/15 1000)   Patient's husband called to say he had requested appointment change, but had not received new appointment date, and time.  Given appointment of January 02, 2016  With arrival at 1:45 p.m. For labs.  To see MD, and receive Herceptin also.

## 2015-12-26 ENCOUNTER — Other Ambulatory Visit: Payer: Medicare Other

## 2015-12-26 ENCOUNTER — Ambulatory Visit: Payer: Medicare Other | Admitting: Oncology

## 2015-12-26 ENCOUNTER — Ambulatory Visit: Payer: Medicare Other

## 2015-12-27 ENCOUNTER — Encounter: Payer: Self-pay | Admitting: *Deleted

## 2015-12-31 ENCOUNTER — Encounter: Payer: Self-pay | Admitting: General Surgery

## 2015-12-31 ENCOUNTER — Ambulatory Visit (INDEPENDENT_AMBULATORY_CARE_PROVIDER_SITE_OTHER): Payer: Medicare Other | Admitting: General Surgery

## 2015-12-31 VITALS — BP 128/74 | HR 72 | Resp 14 | Ht 64.0 in | Wt 149.0 lb

## 2015-12-31 DIAGNOSIS — N6320 Unspecified lump in the left breast, unspecified quadrant: Secondary | ICD-10-CM

## 2015-12-31 DIAGNOSIS — N63 Unspecified lump in breast: Secondary | ICD-10-CM

## 2015-12-31 NOTE — Progress Notes (Signed)
Patient ID: [name], female   DOB: 04-27-1941, 75 y.o.   MRN: [id number]  Chief Complaint  Patient presents with  . Procedure    left breast biopsy    HPI [name] is a 75 y.o. female here today for a left breast biopsy. Since the patient's last visit her case was reviewed with radiation oncology. The area of dermal change lateral to the areola at the 3:00 position and the abnormal echo pattern was not in the field of radiation boost at the time of whole breast radiation. No previous note patient any caregivers notes of the dermal changes. Early biopsy was felt appropriate. Information had been conveyed to her husband Arta Silence, M.D. by phone shortly after her last visit and she presents today for assessment. Indication for biopsy were reviewed. HPI  Past Medical History  Diagnosis Date  . 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 Hearing loss   . Memory impairment     seen by Dr Manuella Ghazi  possible post crainiotomy from radiation  . History of radiation therapy   . Wears glasses   . Numbness and tingling     right hand   . Cataract     bilat   . Hard of hearing     wears hearing aides bilat   . Imbalance   . Arthritis   . History of cancer chemotherapy   . 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    Past Surgical History  Procedure Laterality Date  . Appendectomy  1950  . Brain surgery  1995    left frontal/temporal  . Colonoscopy  2010    Dr. ETiffany Kocher . Breast lumpectomy Left 01/08/2015    Procedure: LUMPECTOMY;  Surgeon: [name] MD;  Location: [facility];  Service: General;  Laterality: Left;  . Sentinel node biopsy Left 01/16/2015    Procedure: SENTINEL NODE BIOPSY;  Surgeon: [name] MD;  Location: [facility];  Service: General;  Laterality: Left;  . Portacath placement Right  01/16/2015    Procedure: INSERTION PORT-A-CATH;  Surgeon: [name] MD;  Location: [facility];  Service: General;  Laterality: Right;  . Total hip arthroplasty Right 09/04/2015    Procedure: RIGHT TOTAL HIP ARTHROPLASTY ANTERIOR APPROACH;  Surgeon: MParalee Cancel MD;  Location: [facility];  Service: Orthopedics;  Laterality: Right;  . Breast biopsy Left 1997  . 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 Breast biopsy Left 01/08/2015    Procedure: BREAST BIOPSY WITH NEEDLE LOCALIZATION;  Surgeon: [name] MD;  Location: [facility];  Service: General;  Laterality: Left;    Family History  Problem Relation Age of Onset  . Breast cancer Sister 478 . Addison's disease Mother   . Stroke Father     Social History Social History  Substance Use Topics  . Smoking status: Former Smoker -- 0.25 packs/day for 5 years    Types: Cigarettes    Quit date: 07/28/1972  . Smokeless tobacco: Never Used  . Alcohol Use: 0.6 - 1.2 oz/week    1-2 Glasses of wine per week     Comment: 1 Glass Wine / Night    Allergies  Allergen Reactions  . Donepezil     Other reaction(s): Other (See Comments) Nightmares  Current Outpatient Prescriptions  Medication Sig Dispense Refill  . Ascorbic Acid (VITAMIN C) 100 MG tablet Take 100 mg by mouth every morning.     . Calcium Carbonate-Vit D-Min (CALTRATE 600+D PLUS MINERALS) 600-800 MG-UNIT TABS Take 1 tablet by mouth every morning.    . ferrous sulfate 325 (65 FE) MG tablet Take 1 tablet (325 mg total) by mouth 3 (three) times daily after meals.  3  . lidocaine-prilocaine (EMLA) cream Apply 1 application topically as needed. 30 g 3  . loratadine (CLARITIN) 10 MG tablet Take 10 mg by mouth 2 (two) times daily.    . polyethylene glycol (MIRALAX / GLYCOLAX) packet Take 17 g by mouth 2 (two) times daily. 14 each 0  . polyvinyl alcohol (LIQUIFILM TEARS) 1.4 % ophthalmic solution Place 1 drop into both eyes daily.     . traMADol (ULTRAM) 50 MG tablet       No current facility-administered medications for this visit.    Review of Systems Review of Systems  Blood pressure 128/74, pulse 72, resp. rate 14, height 5' 4"  (1.626 m), weight 149 lb (67.586 kg).  Physical Exam Physical Exam  Pulmonary/Chest:      Data Reviewed As above  Assessment    Change left breast exam/ultrasound.    Plan    The patient was amenable to proceed. The     Skin was cleansed with alcohol and 10 mL of 0.5% Xylocaine with 0.25% Marcaine with 1-200,000 of epinephrine was utilized well tolerated. ChloraPrep was applied to the skin. A 10-gauge Encor device was then placed and 6 core samples obtained from the area of ultrasound abnormality in the 3:00 position of the breast just outside the edge of the areola. Postbiopsy clip was placed. A 2.5 mm skin biopsy was then taken from the central area of dermal abnormality. Skin defect and post sites was closed with a single 4-0 Prolene suture. Telfa and Tegaderm applied. Ice pack provided. Postbiopsy instructions provided.  The patient will be contacted when biopsy results are available.  She will return in one week for suture removal.   PCP:  [facility]  This information has been scribed by Gaspar Cola CMA.    [name] 12/31/2015, 9:42 PM

## 2015-12-31 NOTE — Patient Instructions (Signed)

## 2016-01-02 ENCOUNTER — Inpatient Hospital Stay: Payer: Medicare Other

## 2016-01-02 ENCOUNTER — Telehealth: Payer: Self-pay | Admitting: *Deleted

## 2016-01-02 ENCOUNTER — Inpatient Hospital Stay (HOSPITAL_BASED_OUTPATIENT_CLINIC_OR_DEPARTMENT_OTHER): Payer: Medicare Other | Admitting: Oncology

## 2016-01-02 ENCOUNTER — Encounter: Payer: Self-pay | Admitting: Oncology

## 2016-01-02 ENCOUNTER — Inpatient Hospital Stay: Payer: Medicare Other | Attending: Oncology

## 2016-01-02 VITALS — BP 133/76 | HR 78 | Temp 96.5°F | Resp 18 | Wt 148.6 lb

## 2016-01-02 DIAGNOSIS — Z923 Personal history of irradiation: Secondary | ICD-10-CM

## 2016-01-02 DIAGNOSIS — C50212 Malignant neoplasm of upper-inner quadrant of left female breast: Secondary | ICD-10-CM

## 2016-01-02 DIAGNOSIS — N641 Fat necrosis of breast: Secondary | ICD-10-CM | POA: Insufficient documentation

## 2016-01-02 DIAGNOSIS — R42 Dizziness and giddiness: Secondary | ICD-10-CM | POA: Diagnosis not present

## 2016-01-02 DIAGNOSIS — Z9221 Personal history of antineoplastic chemotherapy: Secondary | ICD-10-CM

## 2016-01-02 DIAGNOSIS — Z5112 Encounter for antineoplastic immunotherapy: Secondary | ICD-10-CM | POA: Diagnosis not present

## 2016-01-02 DIAGNOSIS — Z87891 Personal history of nicotine dependence: Secondary | ICD-10-CM | POA: Insufficient documentation

## 2016-01-02 DIAGNOSIS — M1990 Unspecified osteoarthritis, unspecified site: Secondary | ICD-10-CM | POA: Diagnosis not present

## 2016-01-02 DIAGNOSIS — C50912 Malignant neoplasm of unspecified site of left female breast: Secondary | ICD-10-CM

## 2016-01-02 DIAGNOSIS — Z171 Estrogen receptor negative status [ER-]: Secondary | ICD-10-CM | POA: Diagnosis not present

## 2016-01-02 DIAGNOSIS — Z79899 Other long term (current) drug therapy: Secondary | ICD-10-CM | POA: Diagnosis not present

## 2016-01-02 DIAGNOSIS — C773 Secondary and unspecified malignant neoplasm of axilla and upper limb lymph nodes: Principal | ICD-10-CM

## 2016-01-02 LAB — COMPREHENSIVE METABOLIC PANEL
ALK PHOS: 51 U/L (ref 38–126)
ALT: 18 U/L (ref 14–54)
ANION GAP: 6 (ref 5–15)
AST: 19 U/L (ref 15–41)
Albumin: 4.4 g/dL (ref 3.5–5.0)
BILIRUBIN TOTAL: 0.7 mg/dL (ref 0.3–1.2)
BUN: 21 mg/dL — ABNORMAL HIGH (ref 6–20)
CALCIUM: 8.8 mg/dL — AB (ref 8.9–10.3)
CO2: 28 mmol/L (ref 22–32)
CREATININE: 0.69 mg/dL (ref 0.44–1.00)
Chloride: 100 mmol/L — ABNORMAL LOW (ref 101–111)
Glucose, Bld: 113 mg/dL — ABNORMAL HIGH (ref 65–99)
Potassium: 3.9 mmol/L (ref 3.5–5.1)
Sodium: 134 mmol/L — ABNORMAL LOW (ref 135–145)
TOTAL PROTEIN: 6.6 g/dL (ref 6.5–8.1)

## 2016-01-02 LAB — CBC WITH DIFFERENTIAL/PLATELET
BASOS ABS: 0 10*3/uL (ref 0–0.1)
Basophils Relative: 0 %
Eosinophils Absolute: 0.1 10*3/uL (ref 0–0.7)
Eosinophils Relative: 2 %
HEMATOCRIT: 38.5 % (ref 35.0–47.0)
Hemoglobin: 13.1 g/dL (ref 12.0–16.0)
LYMPHS PCT: 16 %
Lymphs Abs: 0.9 10*3/uL — ABNORMAL LOW (ref 1.0–3.6)
MCH: 29.7 pg (ref 26.0–34.0)
MCHC: 34 g/dL (ref 32.0–36.0)
MCV: 87.3 fL (ref 80.0–100.0)
MONO ABS: 0.6 10*3/uL (ref 0.2–0.9)
Monocytes Relative: 10 %
NEUTROS ABS: 4.3 10*3/uL (ref 1.4–6.5)
Neutrophils Relative %: 72 %
Platelets: 279 10*3/uL (ref 150–440)
RBC: 4.41 MIL/uL (ref 3.80–5.20)
RDW: 15.4 % — ABNORMAL HIGH (ref 11.5–14.5)
WBC: 6 10*3/uL (ref 3.6–11.0)

## 2016-01-02 MED ORDER — ACETAMINOPHEN 325 MG PO TABS
650.0000 mg | ORAL_TABLET | Freq: Once | ORAL | Status: AC
  Administered 2016-01-02: 650 mg via ORAL
  Filled 2016-01-02: qty 2

## 2016-01-02 MED ORDER — SODIUM CHLORIDE 0.9 % IJ SOLN
10.0000 mL | INTRAMUSCULAR | Status: DC | PRN
  Administered 2016-01-02: 10 mL
  Filled 2016-01-02: qty 10

## 2016-01-02 MED ORDER — HEPARIN SOD (PORK) LOCK FLUSH 100 UNIT/ML IV SOLN
500.0000 [IU] | Freq: Once | INTRAVENOUS | Status: AC | PRN
  Administered 2016-01-02: 500 [IU]
  Filled 2016-01-02 (×2): qty 5

## 2016-01-02 MED ORDER — SODIUM CHLORIDE 0.9 % IV SOLN
6.0000 mg/kg | Freq: Once | INTRAVENOUS | Status: AC
  Administered 2016-01-02: 399 mg via INTRAVENOUS
  Filled 2016-01-02: qty 19

## 2016-01-02 MED ORDER — SODIUM CHLORIDE 0.9 % IV SOLN
Freq: Once | INTRAVENOUS | Status: AC
  Administered 2016-01-02: 15:00:00 via INTRAVENOUS
  Filled 2016-01-02: qty 1000

## 2016-01-02 NOTE — Progress Notes (Signed)
[facility] @ [facility] [phone]  Fax:([phone]) [place] OB: 08-12-1940  MR#: [id number]  AXE#:[id number]  Patient Care Team: [name], [name], MD (General Surgery) Self Requested  CHIEF COMPLAINT:  Chief Complaint  Patient presents with  . 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   Oncology History   1.  Abnormal mammogram of the left breast.  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  Progesterone receptor negative.  HER-2/neu receptor positive 2.  [facility]   3.  Patient received last [facility] chemotherapy on May 16, 2015 4.  She  will be evaluated by radiation oncologist and will continue Herceptin maintenance therapy 5.  Radiation therapy has been started in October of 2016  patient will finish radiation therapy in December of 2016 (December 27 )  Herceptin would be put on hold because of low ejection fraction and planned hip surgery ( last treatment given was in December 21 st  2016 ) 6.Status post hip surgery in in February of 2017.  Patient has improvement in ejection fraction to 61% and has been started on Herceptin from April of 2017   VISIT DIAGNOSIS:   Carcinoma of left breast      INTERVAL HISTORY:  75 year old lady who had a regular mammogram recent mammogram with abnormal ultrasound was done.  There were 3 nodules were found.  Patient underwent stereotactic ultrasound-guided biopsy.  3 nodules were 4 cm 5 cm in 6 cm away from nipple.  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  Has been off all narcotic pain  medication. Has some dull aching pain but not very severe sharp pain that she had before.  Patient had a ejection fraction done by MUGA scan of the heart and it is 61%.  Patient is here for consideration of resuming Herceptin therapy,  Patient states she has been having some problems with her left hip since her right hip surgery. She saw Dr. Earnestine Leys and received a steroid injection about one month ago. Also had breast biopsy on left breast this past week (different site than lumpectomy). Negative result  Review of systems  Patient has developed some pain in the left hip now been being treated with steroid injection. Tolerating treatment very well without any shortness of breath appetite has been stable GENERAL:  Feels good.  Active.  No fevers, sweats or weight loss. PERFORMANCE STATUS (ECOG):  01 HEENT:  No visual changes, runny nose, sore throat, mouth sores or tenderness. Lungs: No shortness of breath or cough.  No hemoptysis. Cardiac:  No chest pain, palpitations, orthopnea, or PND. GI:  No nausea, vomiting, diarrhea, constipation, melena or hematochezia. GU:  No urgency, frequency, dysuria, or hematuria. Musculoskeletal:  Status post hip surgery with significant improvement in pain Extremities:  No pain or swelling. Skin:  No rashes or skin changes. Neuro:  No headache, numbness or weakness, balance or coordination issues. Endocrine:  No diabetes, thyroid issues, hot flashes or night sweats. Psych:  No mood changes, depression or anxiety. Pain:  No focal pain. Review of systems:  All other systems reviewed and  found to be negative.   Ration is here for ongoing evaluation and continuation of treatment.  No shortness of breath no chest pain. PAST MEDICAL HISTORY: Past Medical History  Diagnosis Date  . 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 Hearing loss   . Memory impairment     seen by Dr Manuella Ghazi  possible post crainiotomy from radiation  . History of radiation therapy   .  Wears glasses   . Numbness and tingling     right hand   . Cataract     bilat   . Hard of hearing     wears hearing aides bilat   . Imbalance   . Arthritis   . History of cancer chemotherapy   . Breast cancer of upper-inner quadrant of left female breast ([place]) 12/15/14    Left breast invasive mammary carcinoma, T1cN37mc (1.5 cm); Grade 3, [name], [name], [name], .    PAST SURGICAL HISTORY: Past Surgical History  Procedure Laterality Date  . Appendectomy  1950  . Brain surgery  1995    left frontal/temporal  . Colonoscopy  2010    Dr. ETiffany Kocher . Breast lumpectomy Left 01/08/2015    Procedure: LUMPECTOMY;  Surgeon: [name] MD;  Location: [facility] ORS;  Service: General;  Laterality: Left;  . Sentinel node biopsy Left 01/16/2015    Procedure: SENTINEL NODE BIOPSY;  Surgeon: [name] MD;  Location: [facility] ORS;  Service: General;  Laterality: Left;  . Portacath placement Right 01/16/2015    Procedure: INSERTION PORT-A-CATH;  Surgeon: [name] MD;  Location: [facility] ORS;  Service: General;  Laterality: Right;  . Total hip arthroplasty Right 09/04/2015    Procedure: RIGHT TOTAL HIP ARTHROPLASTY ANTERIOR APPROACH;  Surgeon: MParalee Cancel MD;  Location: [facility]S;  Service: Orthopedics;  Laterality: Right;  . Breast biopsy Left 1997  . 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 Breast biopsy Left 01/08/2015    Procedure: BREAST BIOPSY WITH NEEDLE LOCALIZATION;  Surgeon: [name] MD;  Location: [facility] ORS;  Service: General;  Laterality: Left;    FAMILY HISTORY Family History  Problem Relation Age of Onset  . Breast cancer Sister 435 . Addison's disease Mother   . Stroke Father            HEALTH MAINTENANCE: Social History  Substance Use Topics  . Smoking status: Former Smoker -- 0.25 packs/day for 5 years    Types: Cigarettes    Quit date: 07/28/1972  . Smokeless tobacco: Never Used  . Alcohol Use: 0.6 -  1.2 oz/week    1-2 Glasses of wine per week     Comment: 1 Glass Wine / Night      Allergies  Allergen Reactions  . Donepezil     Other reaction(s): Other (See Comments) Nightmares    Current Outpatient Prescriptions  Medication Sig Dispense Refill  . Ascorbic Acid (VITAMIN C) 100 MG tablet Take 100 mg by mouth every morning.     . Calcium Carbonate-Vit D-Min (CALTRATE 600+D PLUS MINERALS) 600-800 MG-UNIT TABS Take 1 tablet by mouth every morning.    . ferrous sulfate 325 (65 FE) MG tablet Take 1 tablet (325 mg total) by mouth 3 (three) times daily after meals.  3  . lidocaine-prilocaine (EMLA) cream Apply 1 application topically as needed. 30 g 3  . loratadine (CLARITIN) 10 MG tablet Take 10 mg by mouth  2 (two) times daily.    . polyethylene glycol (MIRALAX / GLYCOLAX) packet Take 17 g by mouth 2 (two) times daily. 14 each 0  . polyvinyl alcohol (LIQUIFILM TEARS) 1.4 % ophthalmic solution Place 1 drop into both eyes daily.     . traMADol (ULTRAM) 50 MG tablet      No current facility-administered medications for this visit.   Facility-Administered Medications Ordered in Other Visits  Medication Dose Route Frequency Provider Last Rate Last Dose  . sodium chloride 0.9 % injection 10 mL  10 mL Intracatheter PRN [name], MD   10 mL at 01/02/16 1400    OBJECTIVE: GENERAL:  Well developed, well nourished, sitting comfortably in the exam room in no acute distress. Fatigue  grade 1 MENTAL STATUS:  Alert and oriented to person, place and time.  ENT: No evidence of stomatitisI.  Alopecia  RESPIRATORY:  Clear to auscultation without rales, wheezes or rhonchi. CARDIOVASCULAR:  Regular rate and rhythm without murmur, rub or gallop. Port site within normal limit ABDOMEN:  Soft, non-tender, with active bowel sounds, and no hepatosplenomegaly.  No masses. BACK:  No CVA tenderness.  No tenderness on percussion of the back or rib cage. SKIN:  No rashes, ulcers or lesions. EXTREMITIES:  No edema, no skin discoloration or tenderness.  No palpable cords. LYMPH NODES: No palpable cervical, supraclavicular, axillary or inguinal adenopathy  NEUROLOGICAL: Unremarkable. PSYCH:  Appropriate.  Filed Vitals:   01/02/16 1429  BP: 133/76  Pulse: 78  Temp: 96.5 F (35.8 C)  Resp: 18     Body mass index is 25.49 kg/(m^2).    ECOG FS:1 - Symptomatic but completely ambulatory  LAB RESULTS:  Appointment on 01/02/2016  Component Date Value Ref Range Status  . WBC 01/02/2016 6.0  3.6 - 11.0 K/uL Final  . RBC 01/02/2016 4.41  3.80 - 5.20 MIL/uL Final  . Hemoglobin 01/02/2016 13.1  12.0 - 16.0 g/dL Final  . HCT 01/02/2016 38.5  35.0 - 47.0 % Final  . MCV 01/02/2016 87.3  80.0 - 100.0 fL Final  . MCH 01/02/2016 29.7  26.0 - 34.0 pg Final  . MCHC 01/02/2016 34.0  32.0 - 36.0 g/dL Final  . RDW 01/02/2016 15.4* 11.5 - 14.5 % Final  . Platelets 01/02/2016 279  150 - 440 K/uL Final  . Neutrophils Relative % 01/02/2016 72   Final  . Neutro Abs 01/02/2016 4.3  1.4 - 6.5 K/uL Final  . Lymphocytes Relative 01/02/2016 16   Final  . Lymphs Abs 01/02/2016 0.9* 1.0 - 3.6 K/uL Final  . Monocytes Relative 01/02/2016 10   Final  . Monocytes Absolute 01/02/2016 0.6  0.2 - 0.9 K/uL Final  . Eosinophils Relative 01/02/2016 2   Final  . Eosinophils Absolute 01/02/2016 0.1  0 - 0.7 K/uL Final  . Basophils Relative 01/02/2016 0   Final  . Basophils Absolute 01/02/2016 0.0  0 - 0.1 K/uL Final  . Sodium 01/02/2016 134* 135 - 145 mmol/L Final  . Potassium 01/02/2016 3.9  3.5 - 5.1 mmol/L Final  . Chloride 01/02/2016 100* 101 - 111 mmol/L Final  . CO2 01/02/2016 28  22 - 32 mmol/L Final  . Glucose, Bld 01/02/2016 113* 65 - 99 mg/dL Final  . BUN 01/02/2016 21* 6 - 20 mg/dL Final  . Creatinine, Ser 01/02/2016 0.69  0.44 - 1.00 mg/dL Final  . Calcium 01/02/2016 8.8* 8.9 - 10.3 mg/dL Final  . Total Protein 01/02/2016 6.6  6.5 - 8.1 g/dL Final  .  Albumin 01/02/2016 4.4  3.5 - 5.0 g/dL Final  . AST  01/02/2016 19  15 - 41 U/L Final  . ALT 01/02/2016 18  14 - 54 U/L Final  . Alkaline Phosphatase 01/02/2016 51  38 - 126 U/L Final  . Total Bilirubin 01/02/2016 0.7  0.3 - 1.2 mg/dL Final  . GFR calc non Af Amer 01/02/2016 >60  >60 mL/min Final  . GFR calc Af Amer 01/02/2016 >60  >60 mL/min Final   Comment: (NOTE) The eGFR has been calculated using the CKD EPI equation. This calculation has not been validated in all clinical situations. eGFR's persistently <60 mL/min signify possible Chronic Kidney Disease.   . Anion gap 01/02/2016 6  5 - 15 Final     ASSESSMENT:  17 -year-old lady with a history of carcinoma breast.   T1c N1 C micro-M0  She will continue for 2 more cycles of chemotherapy.  With Herceptin. That will finish approximately 1 year of Herceptin treatment (has few treatment missed in between because of surgery.)  We will repeat MUGA scan of the heart.  Patient will be reevaluated in 3 and 6 weeks by my associate.  The redness in the breast which has been biopsied and tolerated the report is consistent with necrosis of the skin most likely secondary to radiation therapy and will be carefully watched. Pathology report has been reviewed.  All lab data has been reviewed.   [name], MD   01/02/2016 5:10 PM

## 2016-01-02 NOTE — Telephone Encounter (Signed)
Notified patient as instructed, patient pleased. Discussed follow-up appointments, patient agrees  

## 2016-01-02 NOTE — Telephone Encounter (Signed)
-----   Message from [name], MD sent at 01/02/2016  7:13 AM EDT ----- I left a message on Dr. Lu Duffel phone this AM.  Please call the house today and be sure they got the message that all is well. Thanks   ----- Message -----    From: Lab in [facility] Interface    Sent: 01/01/2016  11:38 AM      To: [name], MD

## 2016-01-02 NOTE — Progress Notes (Signed)
Patient states she has been having some problems with her left hip since her right hip surgery.  She saw Dr. Earnestine Leys and received a steroid injection about one month ago.  Also had breast biopsy on left breast this past week (different site than lumpectomy). Negative result.

## 2016-01-07 ENCOUNTER — Ambulatory Visit (INDEPENDENT_AMBULATORY_CARE_PROVIDER_SITE_OTHER): Payer: Medicare Other

## 2016-01-07 DIAGNOSIS — N6320 Unspecified lump in the left breast, unspecified quadrant: Secondary | ICD-10-CM

## 2016-01-07 DIAGNOSIS — N63 Unspecified lump in breast: Secondary | ICD-10-CM

## 2016-01-07 NOTE — Progress Notes (Signed)
Patient ID: [name], female   DOB: 09/05/1940, 76 y.o.   MRN: [id number] Patient came in today for a wound check.  The wound is clean, with no signs of infection noted. Two sutures removed from left breast. Patient is aware of her pathology. Follow up as scheduled.

## 2016-01-22 ENCOUNTER — Other Ambulatory Visit: Payer: Self-pay | Admitting: *Deleted

## 2016-01-22 DIAGNOSIS — N6320 Unspecified lump in the left breast, unspecified quadrant: Secondary | ICD-10-CM

## 2016-01-23 ENCOUNTER — Inpatient Hospital Stay: Payer: Medicare Other

## 2016-01-23 ENCOUNTER — Inpatient Hospital Stay (HOSPITAL_BASED_OUTPATIENT_CLINIC_OR_DEPARTMENT_OTHER): Payer: Medicare Other | Admitting: Internal Medicine

## 2016-01-23 VITALS — BP 128/80 | HR 75 | Temp 96.8°F | Resp 18 | Wt 150.6 lb

## 2016-01-23 DIAGNOSIS — C50212 Malignant neoplasm of upper-inner quadrant of left female breast: Secondary | ICD-10-CM | POA: Diagnosis not present

## 2016-01-23 DIAGNOSIS — Z79899 Other long term (current) drug therapy: Secondary | ICD-10-CM

## 2016-01-23 DIAGNOSIS — C50812 Malignant neoplasm of overlapping sites of left female breast: Secondary | ICD-10-CM | POA: Insufficient documentation

## 2016-01-23 DIAGNOSIS — Z923 Personal history of irradiation: Secondary | ICD-10-CM

## 2016-01-23 DIAGNOSIS — C773 Secondary and unspecified malignant neoplasm of axilla and upper limb lymph nodes: Secondary | ICD-10-CM

## 2016-01-23 DIAGNOSIS — Z9221 Personal history of antineoplastic chemotherapy: Secondary | ICD-10-CM

## 2016-01-23 DIAGNOSIS — R42 Dizziness and giddiness: Secondary | ICD-10-CM

## 2016-01-23 DIAGNOSIS — C50912 Malignant neoplasm of unspecified site of left female breast: Secondary | ICD-10-CM

## 2016-01-23 DIAGNOSIS — Z171 Estrogen receptor negative status [ER-]: Secondary | ICD-10-CM

## 2016-01-23 DIAGNOSIS — N641 Fat necrosis of breast: Secondary | ICD-10-CM

## 2016-01-23 DIAGNOSIS — N6320 Unspecified lump in the left breast, unspecified quadrant: Secondary | ICD-10-CM

## 2016-01-23 DIAGNOSIS — Z87891 Personal history of nicotine dependence: Secondary | ICD-10-CM

## 2016-01-23 DIAGNOSIS — Z5112 Encounter for antineoplastic immunotherapy: Secondary | ICD-10-CM | POA: Diagnosis not present

## 2016-01-23 HISTORY — DX: Malignant neoplasm of overlapping sites of left female breast: C50.812

## 2016-01-23 LAB — CBC WITH DIFFERENTIAL/PLATELET
BASOS ABS: 0 10*3/uL (ref 0–0.1)
BASOS PCT: 1 %
EOS ABS: 0.2 10*3/uL (ref 0–0.7)
Eosinophils Relative: 3 %
HEMATOCRIT: 37.7 % (ref 35.0–47.0)
HEMOGLOBIN: 13 g/dL (ref 12.0–16.0)
LYMPHS PCT: 14 %
Lymphs Abs: 0.7 10*3/uL — ABNORMAL LOW (ref 1.0–3.6)
MCH: 30.3 pg (ref 26.0–34.0)
MCHC: 34.4 g/dL (ref 32.0–36.0)
MCV: 88.1 fL (ref 80.0–100.0)
MONOS PCT: 9 %
Monocytes Absolute: 0.4 10*3/uL (ref 0.2–0.9)
NEUTROS ABS: 3.6 10*3/uL (ref 1.4–6.5)
NEUTROS PCT: 73 %
Platelets: 282 10*3/uL (ref 150–440)
RBC: 4.28 MIL/uL (ref 3.80–5.20)
RDW: 14.9 % — ABNORMAL HIGH (ref 11.5–14.5)
WBC: 4.9 10*3/uL (ref 3.6–11.0)

## 2016-01-23 LAB — COMPREHENSIVE METABOLIC PANEL
ALBUMIN: 4.1 g/dL (ref 3.5–5.0)
ALK PHOS: 58 U/L (ref 38–126)
ALT: 18 U/L (ref 14–54)
ANION GAP: 8 (ref 5–15)
AST: 26 U/L (ref 15–41)
BUN: 17 mg/dL (ref 6–20)
CALCIUM: 8.8 mg/dL — AB (ref 8.9–10.3)
CO2: 26 mmol/L (ref 22–32)
Chloride: 101 mmol/L (ref 101–111)
Creatinine, Ser: 0.77 mg/dL (ref 0.44–1.00)
GFR calc Af Amer: >60 mL/min (ref >60)
GFR calc non Af Amer: >60 mL/min (ref >60)
GLUCOSE: 129 mg/dL — AB (ref 65–99)
Potassium: 3.9 mmol/L (ref 3.5–5.1)
SODIUM: 135 mmol/L (ref 135–145)
Total Bilirubin: 0.6 mg/dL (ref 0.3–1.2)
Total Protein: 6.6 g/dL (ref 6.5–8.1)

## 2016-01-23 MED ORDER — HEPARIN SOD (PORK) LOCK FLUSH 100 UNIT/ML IV SOLN
500.0000 [IU] | Freq: Once | INTRAVENOUS | Status: AC
  Administered 2016-01-23: 500 [IU] via INTRAVENOUS
  Filled 2016-01-23: qty 5

## 2016-01-23 MED ORDER — SODIUM CHLORIDE 0.9% FLUSH
10.0000 mL | INTRAVENOUS | Status: DC | PRN
Start: 2016-01-23 — End: 2016-01-23
  Administered 2016-01-23: 10 mL via INTRAVENOUS
  Filled 2016-01-23: qty 10

## 2016-01-23 MED ORDER — SODIUM CHLORIDE 0.9 % IV SOLN
6.0000 mg/kg | Freq: Once | INTRAVENOUS | Status: AC
  Administered 2016-01-23: 399 mg via INTRAVENOUS
  Filled 2016-01-23: qty 19

## 2016-01-23 MED ORDER — SODIUM CHLORIDE 0.9 % IV SOLN
Freq: Once | INTRAVENOUS | Status: AC
  Administered 2016-01-23: 11:00:00 via INTRAVENOUS
  Filled 2016-01-23: qty 1000

## 2016-01-23 MED ORDER — ACETAMINOPHEN 325 MG PO TABS
650.0000 mg | ORAL_TABLET | Freq: Once | ORAL | Status: AC
  Administered 2016-01-23: 650 mg via ORAL
  Filled 2016-01-23: qty 2

## 2016-01-23 NOTE — Progress Notes (Signed)
Patient continues to have achiness in left breast.  Also pain in right hip.

## 2016-01-23 NOTE — Progress Notes (Signed)
[place] OFFICE PROGRESS NOTE  Patient Care Team: [name], [name], MD (General Surgery) Self Requested  Breast cancer metastasized to axillary lymph node [facility])   Staging form: Breast, [name], [name], [name], MD on 01/29/2015    Oncology History   1.  Abnormal mammogram of the left breast.  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  Progesterone receptor negative.  HER-2/neu receptor positive 2.  Patient is starting [facility] from July OF 2016   eft breast; surgery 01/11/15 with Dr. Bary Castilla; path with invasive mammary and DCIS; 1.5 cm. HER-2/neu positive. 1 micrometastatic notable deposit within 3 lymph nodes. Overview:  Overview:  1.5 cm. HER-2/neu positive. 1 micrometastatic notable deposit within 3 lymph nodes. Overview:  left breast; surgery 01/11/15 with Dr. Bary Castilla; path with invasive mammary and DCIS Overview:  left breast; surgery 01/11/15 with Dr. Bary Castilla; path with invasive mammary and DCIS     Breast cancer metastasized to axillary lymph node ([place])   01/24/2015 Initial Diagnosis Breast cancer metastasized to axillary lymph node    Cancer of overlapping sites of left female breast ([place])   01/23/2016 Initial Diagnosis Cancer of overlapping sites of left female breast [facility])     INTERVAL HISTORY:  [name] 76 y.o.  female pleasant patient above history of  Stage I  ER/[name] HER-2/neu positive breast cancer on Herceptin-  Adjuvant is here for follow-up.   Patient had a recent skin biopsy of left breast-  Found to have fat necrosis no malignancy.    Denies any unusual shortness of breath or chest pain. No  cough. No swelling in legs. Patient's chronic balance issues which is not any worse. She walks with a cane.  No headaches or vision changes.  REVIEW OF SYSTEMS:  A complete 10 point review of system is done which is negative except mentioned above/history of present illness.   PAST MEDICAL HISTORY :  Past Medical History  Diagnosis Date  . 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 Hearing loss   . Memory impairment     seen by Dr Manuella Ghazi  possible post crainiotomy from radiation  . History of radiation therapy   . Wears glasses   . Numbness and tingling     right hand   . Cataract     bilat   . Hard of hearing     wears hearing aides bilat   . Imbalance   . Arthritis   . History of cancer chemotherapy   . Breast cancer of upper-inner quadrant of left female breast ([place]) 12/15/14    Left breast invasive mammary carcinoma, T1cN58mc (1.5 cm); Grade 3, [name], [name], [name], .    PAST SURGICAL HISTORY :   Past Surgical History  Procedure Laterality Date  . Appendectomy  1950  . Brain surgery  1995    left frontal/temporal  . Colonoscopy  2010    Dr. ETiffany Kocher . Breast lumpectomy Left 01/08/2015    Procedure: LUMPECTOMY;  Surgeon: [name] MD;  Location: [facility] ORS;  Service: General;  Laterality: Left;  . Sentinel node  biopsy Left 01/16/2015    Procedure: SENTINEL NODE BIOPSY;  Surgeon: [name], MD;  Location: [facility] ORS;  Service: General;  Laterality: Left;  . Portacath placement Right 01/16/2015    Procedure: INSERTION PORT-A-CATH;  Surgeon: [name], MD;  Location: [facility] ORS;  Service: General;  Laterality: Right;  . Total hip arthroplasty Right 09/04/2015    Procedure: RIGHT TOTAL HIP ARTHROPLASTY ANTERIOR APPROACH;  Surgeon: [name], MD;  Location: [facility];  Service: Orthopedics;  Laterality: Right;  . Breast biopsy Left 1997  . 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 Breast biopsy Left 01/08/2015     Procedure: BREAST BIOPSY WITH NEEDLE LOCALIZATION;  Surgeon: [name], MD;  Location: [facility] ORS;  Service: General;  Laterality: Left;    FAMILY HISTORY :   Family History  Problem Relation Age of Onset  . Breast cancer Sister 79  . Addison's disease Mother   . Stroke Father     SOCIAL HISTORY:   Social History  Substance Use Topics  . Smoking status: Former Smoker -- 0.25 packs/day for 5 years    Types: Cigarettes    Quit date: 07/28/1972  . Smokeless tobacco: Never Used  . Alcohol Use: 0.6 - 1.2 oz/week    1-2 Glasses of wine per week     Comment: 1 Glass Wine / Night    ALLERGIES:  is allergic to donepezil.  MEDICATIONS:  Current Outpatient Prescriptions  Medication Sig Dispense Refill  . Ascorbic Acid (VITAMIN C) 100 MG tablet Take 100 mg by mouth every morning.     . Calcium Carbonate-Vit D-Min (CALTRATE 600+D PLUS MINERALS) 600-800 MG-UNIT TABS Take 1 tablet by mouth every morning.    . ferrous sulfate 325 (65 FE) MG tablet Take 1 tablet (325 mg total) by mouth 3 (three) times daily after meals.  3  . lidocaine-prilocaine (EMLA) cream Apply 1 application topically as needed. 30 g 3  . loratadine (CLARITIN) 10 MG tablet Take 10 mg by mouth 2 (two) times daily.    . polyethylene glycol (MIRALAX / GLYCOLAX) packet Take 17 g by mouth 2 (two) times daily. 14 each 0  . polyvinyl alcohol (LIQUIFILM TEARS) 1.4 % ophthalmic solution Place 1 drop into both eyes daily.     . traMADol (ULTRAM) 50 MG tablet      No current facility-administered medications for this visit.    PHYSICAL EXAMINATION: ECOG PERFORMANCE STATUS: 1 - Symptomatic but completely ambulatory  BP 128/80 mmHg  Pulse 75  Temp(Src) 96.8 F (36 C) (Tympanic)  Resp 18  Wt 150 lb 9.2 oz (68.3 kg)  Filed Weights   01/23/16 0956  Weight: 150 lb 9.2 oz (68.3 kg)    GENERAL: Well-nourished well-developed; Alert, no distress and comfortable.  With her husband.  Walks with a cane. EYES: no pallor or  icterus OROPHARYNX: no thrush or ulceration; good dentition  NECK: supple, no masses felt LYMPH:  no palpable lymphadenopathy in the cervical, axillary or inguinal regions LUNGS: clear to auscultation and  No wheeze or crackles HEART/CVS: regular rate & rhythm and no murmurs; No lower extremity edema ABDOMEN:abdomen soft, non-tender and normal bowel sounds Musculoskeletal:no cyanosis of digits and no clubbing  PSYCH: alert & oriented x 3 with fluent speech NEURO: no focal motor/sensory deficits SKIN:  no rashes or significant lesions  LABORATORY DATA:  I have reviewed the data as listed    Component Value Date/Time   NA 135 01/23/2016 2536  K 3.9 01/23/2016 0918   CL 101 01/23/2016 0918   CO2 26 01/23/2016 0918   GLUCOSE 129* 01/23/2016 0918   BUN 17 01/23/2016 0918   CREATININE 0.77 01/23/2016 0918   CALCIUM 8.8* 01/23/2016 0918   PROT 6.6 01/23/2016 0918   ALBUMIN 4.1 01/23/2016 0918   AST 26 01/23/2016 0918   ALT 18 01/23/2016 0918   ALKPHOS 58 01/23/2016 0918   BILITOT 0.6 01/23/2016 0918   GFRNONAA >60 01/23/2016 0918   GFRAA >60 01/23/2016 0918    No results found for: SPEP, UPEP  Lab Results  Component Value Date   WBC 4.9 01/23/2016   NEUTROABS 3.6 01/23/2016   HGB 13.0 01/23/2016   HCT 37.7 01/23/2016   MCV 88.1 01/23/2016   PLT 282 01/23/2016      Chemistry      Component Value Date/Time   NA 135 01/23/2016 0918   K 3.9 01/23/2016 0918   CL 101 01/23/2016 0918   CO2 26 01/23/2016 0918   BUN 17 01/23/2016 0918   CREATININE 0.77 01/23/2016 0918      Component Value Date/Time   CALCIUM 8.8* 01/23/2016 0918   ALKPHOS 58 01/23/2016 0918   AST 26 01/23/2016 0918   ALT 18 01/23/2016 0918   BILITOT 0.6 01/23/2016 0918       RADIOGRAPHIC STUDIES: I have personally reviewed the radiological images as listed and agreed with the findings in the report. No results found.   ASSESSMENT & PLAN:  Cancer of overlapping sites of left female breast  ([place]) # invasive carcinoma of breast T1c n1MIC M0; Estrogen receptor negative.  Progesterone receptor negative.  HER-2/neu receptor positive. Patient likely has 2 more treatments to go one today and again in 3 weeks. Discussed that Herceptin is given 110 milligrams per kilogram total dose.  # Left breast fat necrosis-  Status post biopsy- Healing well.  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  # Follow-up in 3 weeks with CBC CMP likely last dose of herceptin.    No orders of the defined types were placed in this encounter.   All questions were answered. The patient knows to call the clinic with any problems, questions or concerns.      Cammie Sickle, MD 01/23/2016 5:25 PM

## 2016-01-23 NOTE — Assessment & Plan Note (Addendum)
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  Progesterone receptor negative.  HER-2/neu receptor positive. Patient likely has 2 more treatments to go one today and again in 3 weeks. Discussed that Herceptin is given 110 milligrams per kilogram total dose.  # Left breast fat necrosis-  Status post biopsy- Healing well.  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  # Follow-up in 3 weeks with CBC CMP likely last dose of herceptin.

## 2016-02-12 ENCOUNTER — Other Ambulatory Visit: Payer: Self-pay | Admitting: *Deleted

## 2016-02-12 DIAGNOSIS — C50812 Malignant neoplasm of overlapping sites of left female breast: Secondary | ICD-10-CM

## 2016-02-13 ENCOUNTER — Encounter: Payer: Self-pay | Admitting: Internal Medicine

## 2016-02-13 ENCOUNTER — Inpatient Hospital Stay: Payer: Medicare Other | Attending: Internal Medicine | Admitting: Internal Medicine

## 2016-02-13 ENCOUNTER — Inpatient Hospital Stay: Payer: Medicare Other

## 2016-02-13 VITALS — BP 130/79 | HR 81 | Temp 97.0°F | Resp 18 | Wt 151.6 lb

## 2016-02-13 DIAGNOSIS — N641 Fat necrosis of breast: Secondary | ICD-10-CM

## 2016-02-13 DIAGNOSIS — R413 Other amnesia: Secondary | ICD-10-CM | POA: Diagnosis not present

## 2016-02-13 DIAGNOSIS — R42 Dizziness and giddiness: Secondary | ICD-10-CM | POA: Diagnosis not present

## 2016-02-13 DIAGNOSIS — C801 Malignant (primary) neoplasm, unspecified: Secondary | ICD-10-CM

## 2016-02-13 DIAGNOSIS — Z171 Estrogen receptor negative status [ER-]: Secondary | ICD-10-CM

## 2016-02-13 DIAGNOSIS — Z87891 Personal history of nicotine dependence: Secondary | ICD-10-CM

## 2016-02-13 DIAGNOSIS — Z923 Personal history of irradiation: Secondary | ICD-10-CM

## 2016-02-13 DIAGNOSIS — C50212 Malignant neoplasm of upper-inner quadrant of left female breast: Secondary | ICD-10-CM | POA: Diagnosis not present

## 2016-02-13 DIAGNOSIS — Z79899 Other long term (current) drug therapy: Secondary | ICD-10-CM | POA: Diagnosis not present

## 2016-02-13 DIAGNOSIS — Z5112 Encounter for antineoplastic immunotherapy: Secondary | ICD-10-CM | POA: Diagnosis not present

## 2016-02-13 DIAGNOSIS — M129 Arthropathy, unspecified: Secondary | ICD-10-CM | POA: Diagnosis not present

## 2016-02-13 DIAGNOSIS — Z803 Family history of malignant neoplasm of breast: Secondary | ICD-10-CM | POA: Diagnosis not present

## 2016-02-13 DIAGNOSIS — C773 Secondary and unspecified malignant neoplasm of axilla and upper limb lymph nodes: Secondary | ICD-10-CM

## 2016-02-13 DIAGNOSIS — C50812 Malignant neoplasm of overlapping sites of left female breast: Secondary | ICD-10-CM

## 2016-02-13 DIAGNOSIS — R200 Anesthesia of skin: Secondary | ICD-10-CM | POA: Diagnosis not present

## 2016-02-13 DIAGNOSIS — R202 Paresthesia of skin: Secondary | ICD-10-CM

## 2016-02-13 DIAGNOSIS — C50912 Malignant neoplasm of unspecified site of left female breast: Secondary | ICD-10-CM

## 2016-02-13 LAB — CBC WITH DIFFERENTIAL/PLATELET
Basophils Absolute: 0 10*3/uL (ref 0–0.1)
Basophils Relative: 0 %
EOS ABS: 0.1 10*3/uL (ref 0–0.7)
EOS PCT: 3 %
HCT: 37.7 % (ref 35.0–47.0)
Hemoglobin: 13 g/dL (ref 12.0–16.0)
LYMPHS ABS: 0.6 10*3/uL — AB (ref 1.0–3.6)
Lymphocytes Relative: 11 %
MCH: 30.8 pg (ref 26.0–34.0)
MCHC: 34.5 g/dL (ref 32.0–36.0)
MCV: 89.4 fL (ref 80.0–100.0)
MONOS PCT: 6 %
Monocytes Absolute: 0.3 10*3/uL (ref 0.2–0.9)
Neutro Abs: 4.6 10*3/uL (ref 1.4–6.5)
Neutrophils Relative %: 80 %
PLATELETS: 302 10*3/uL (ref 150–440)
RBC: 4.22 MIL/uL (ref 3.80–5.20)
RDW: 15.2 % — AB (ref 11.5–14.5)
WBC: 5.7 10*3/uL (ref 3.6–11.0)

## 2016-02-13 LAB — COMPREHENSIVE METABOLIC PANEL
ALT: 18 U/L (ref 14–54)
AST: 27 U/L (ref 15–41)
Albumin: 4.2 g/dL (ref 3.5–5.0)
Alkaline Phosphatase: 64 U/L (ref 38–126)
Anion gap: 10 (ref 5–15)
BUN: 16 mg/dL (ref 6–20)
CHLORIDE: 96 mmol/L — AB (ref 101–111)
CO2: 26 mmol/L (ref 22–32)
Calcium: 8.6 mg/dL — ABNORMAL LOW (ref 8.9–10.3)
Creatinine, Ser: 0.85 mg/dL (ref 0.44–1.00)
Glucose, Bld: 168 mg/dL — ABNORMAL HIGH (ref 65–99)
POTASSIUM: 3.8 mmol/L (ref 3.5–5.1)
SODIUM: 132 mmol/L — AB (ref 135–145)
Total Bilirubin: 0.7 mg/dL (ref 0.3–1.2)
Total Protein: 6.7 g/dL (ref 6.5–8.1)

## 2016-02-13 MED ORDER — SODIUM CHLORIDE 0.9 % IV SOLN
Freq: Once | INTRAVENOUS | Status: AC
  Administered 2016-02-13: 11:00:00 via INTRAVENOUS
  Filled 2016-02-13: qty 1000

## 2016-02-13 MED ORDER — SODIUM CHLORIDE 0.9% FLUSH
10.0000 mL | INTRAVENOUS | Status: DC | PRN
  Administered 2016-02-13: 10 mL via INTRAVENOUS
  Filled 2016-02-13: qty 10

## 2016-02-13 MED ORDER — ACETAMINOPHEN 325 MG PO TABS
650.0000 mg | ORAL_TABLET | Freq: Once | ORAL | Status: AC
  Administered 2016-02-13: 650 mg via ORAL
  Filled 2016-02-13: qty 2

## 2016-02-13 MED ORDER — TRASTUZUMAB CHEMO 150 MG IV SOLR
6.0000 mg/kg | Freq: Once | INTRAVENOUS | Status: AC
  Administered 2016-02-13: 399 mg via INTRAVENOUS
  Filled 2016-02-13: qty 19

## 2016-02-13 MED ORDER — HEPARIN SOD (PORK) LOCK FLUSH 100 UNIT/ML IV SOLN
500.0000 [IU] | Freq: Once | INTRAVENOUS | Status: AC | PRN
  Administered 2016-02-13: 500 [IU]
  Filled 2016-02-13: qty 5

## 2016-02-13 NOTE — Progress Notes (Signed)
[name], [name], [name], [name], [name], [name], MD on 01/29/2015    Oncology History   # June 2016- LEFT BREAST CA;  [name] ; [name] ; [name]; Her 2 Neu POS; [name]; [name]; adjuvant Herceptin [? Finished July 2017]  # June 2017- left breast Bx- fat necrosis [[name]  # chronic gait/balance issues     Breast cancer metastasized to axillary lymph node ([place])   01/24/2015 Initial Diagnosis Breast cancer metastasized to axillary lymph node    Cancer of overlapping sites of left female breast ([place])   01/23/2016 Initial Diagnosis Cancer of overlapping sites of left female breast [facility])     INTERVAL HISTORY:  [name] 75 y.o.  female pleasant patient above history of  Stage I  ER/[name] HER-2/neu positive breast cancer on Herceptin-  Adjuvant is here for follow-up.  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 No falls. She walks with cane.   Denies any unusual shortness of breath or chest pain. No cough. No swelling in legs.   No headaches or vision changes.  REVIEW OF SYSTEMS:  A complete 10 point review of system is done which is negative except mentioned above/history of present illness.   PAST MEDICAL HISTORY :  Past Medical History  Diagnosis Date  . 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 Hearing loss   . Memory impairment     seen by Dr Manuella Ghazi  possible post crainiotomy from radiation  . History of radiation therapy   . Wears glasses   . Numbness and tingling     right hand   . Cataract     bilat   . Hard of hearing     wears  hearing aides bilat   . Imbalance   . Arthritis   . History of cancer chemotherapy   . Breast cancer of upper-inner quadrant of left female breast ([place]) 12/15/14    Left breast invasive mammary carcinoma, [name]; Grade 3, [name], [name], [name], .    PAST SURGICAL HISTORY :   Past Surgical History  Procedure Laterality Date  . Appendectomy  1950  . Brain surgery  1995    left frontal/temporal  . Colonoscopy  2010    Dr. ETiffany Kocher . Breast lumpectomy Left 01/08/2015    Procedure: LUMPECTOMY;  Surgeon: [name] MD;  Location: [facility] ORS;  Service: General;  Laterality: Left;  . Sentinel node biopsy Left 01/16/2015    Procedure: SENTINEL NODE BIOPSY;  Surgeon: [name] MD;  Location: [facility] ORS;  Service: General;  Laterality: Left;  . Portacath placement Right 01/16/2015    Procedure: INSERTION PORT-A-CATH;  Surgeon: [name] MD;  Location: [facility] ORS;  Service: General;  Laterality: Right;  . Total hip arthroplasty Right 09/04/2015    Procedure: RIGHT TOTAL HIP ARTHROPLASTY ANTERIOR APPROACH;  Surgeon: MParalee Cancel MD;  Location: [facility]S;  Service: Orthopedics;  Laterality: Right;  . Breast biopsy Left 1997  . Breast biopsy  Left 12/15/14    confirmed DCIS  . Breast biopsy Left 01/08/2015    Procedure: BREAST BIOPSY WITH NEEDLE LOCALIZATION;  Surgeon: Jeffrey W Byrnett, MD;  Location: [facility] ORS;  Service: General;  Laterality: Left;    FAMILY HISTORY :   Family History  Problem Relation Age of Onset  . Breast cancer Sister 48  . Addison's disease Mother   . Stroke Father     SOCIAL HISTORY:   Social History  Substance Use Topics  . Smoking status: Former Smoker -- 0.25 packs/day for 5 years    Types: Cigarettes    Quit date: 07/28/1972  . Smokeless tobacco: Never Used  . Alcohol Use: 0.6 - 1.2 oz/week    1-2 Glasses of wine per week     Comment: 1 Glass Wine / Night    ALLERGIES:  is allergic to  donepezil.  MEDICATIONS:  Current Outpatient Prescriptions  Medication Sig Dispense Refill  . Ascorbic Acid (VITAMIN C) 100 MG tablet Take 100 mg by mouth every morning.     . Calcium Carbonate-Vit D-Min (CALTRATE 600+D PLUS MINERALS) 600-800 MG-UNIT TABS Take 1 tablet by mouth every morning.    . ferrous sulfate 325 (65 FE) MG tablet Take 1 tablet (325 mg total) by mouth 3 (three) times daily after meals.  3  . lidocaine-prilocaine (EMLA) cream Apply 1 application topically as needed. 30 g 3  . loratadine (CLARITIN) 10 MG tablet Take 10 mg by mouth 2 (two) times daily.    . polyethylene glycol (MIRALAX / GLYCOLAX) packet Take 17 g by mouth 2 (two) times daily. 14 each 0  . polyvinyl alcohol (LIQUIFILM TEARS) 1.4 % ophthalmic solution Place 1 drop into both eyes daily.     . traMADol (ULTRAM) 50 MG tablet      No current facility-administered medications for this visit.    PHYSICAL EXAMINATION: ECOG PERFORMANCE STATUS: 1 - Symptomatic but completely ambulatory  BP 130/79 mmHg  Pulse 81  Temp(Src) 97 F (36.1 C) (Tympanic)  Resp 18  Wt 151 lb 9 oz (68.748 kg)  Filed Weights   02/13/16 0941  Weight: 151 lb 9 oz (68.748 kg)    GENERAL: Well-nourished well-developed; Alert, no distress and comfortable.  With her husband.  Walks with a cane. EYES: no pallor or icterus OROPHARYNX: no thrush or ulceration; good dentition  NECK: supple, no masses felt LYMPH:  no palpable lymphadenopathy in the cervical, axillary or inguinal regions LUNGS: clear to auscultation and  No wheeze or crackles HEART/CVS: regular rate & rhythm and no murmurs; No lower extremity edema ABDOMEN:abdomen soft, non-tender and normal bowel sounds Musculoskeletal:no cyanosis of digits and no clubbing  PSYCH: alert & oriented x 3 with fluent speech NEURO: no focal motor/sensory deficits SKIN:  no rashes or significant lesions  LABORATORY DATA:  I have reviewed the data as listed    Component Value Date/Time    NA 132* 02/13/2016 0908   K 3.8 02/13/2016 0908   CL 96* 02/13/2016 0908   CO2 26 02/13/2016 0908   GLUCOSE 168* 02/13/2016 0908   BUN 16 02/13/2016 0908   CREATININE 0.85 02/13/2016 0908   CALCIUM 8.6* 02/13/2016 0908   PROT 6.7 02/13/2016 0908   ALBUMIN 4.2 02/13/2016 0908   AST 27 02/13/2016 0908   ALT 18 02/13/2016 0908   ALKPHOS 64 02/13/2016 0908   BILITOT 0.7 02/13/2016 0908   GFRNONAA >60 02/13/2016 0908   GFRAA >60 02/13/2016 0908    No results found for:   SPEP, UPEP  Lab Results  Component Value Date   WBC 5.7 02/13/2016   NEUTROABS 4.6 02/13/2016   HGB 13.0 02/13/2016   HCT 37.7 02/13/2016   MCV 89.4 02/13/2016   PLT 302 02/13/2016      Chemistry      Component Value Date/Time   NA 132* 02/13/2016 0908   K 3.8 02/13/2016 0908   CL 96* 02/13/2016 0908   CO2 26 02/13/2016 0908   BUN 16 02/13/2016 0908   CREATININE 0.85 02/13/2016 0908      Component Value Date/Time   CALCIUM 8.6* 02/13/2016 0908   ALKPHOS 64 02/13/2016 0908   AST 27 02/13/2016 0908   ALT 18 02/13/2016 0908   BILITOT 0.7 02/13/2016 0908       RADIOGRAPHIC STUDIES: I have personally reviewed the radiological images as listed and agreed with the findings in the report. No results found.   ASSESSMENT & PLAN:  Cancer of overlapping sites of left female breast ([place]) # invasive carcinoma of breast T1c n1MIC M0; Estrogen receptor negative.  Progesterone receptor negative.  HER-2/neu receptor positive. Patient likely has 1 treatments to go one today. Discussed that Herceptin is given 110 milligrams per kilogram total dose. We'll check with pharmacy regarding the total dose of herceptin.   # Left breast fat necrosis-  Status post biopsy- Healing well.  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  # genetic testing- sister- 61s- breast cancer; check my risk genetic testing.   # Follow-up in 3-4  weeks with MUGA scan on july26th; NO labs.    No orders of the defined types were  placed in this encounter.   All questions were answered. The patient knows to call the clinic with any problems, questions or concerns.      Cammie Sickle, MD 02/13/2016 10:58 PM

## 2016-02-13 NOTE — Assessment & Plan Note (Addendum)
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  Progesterone receptor negative.  HER-2/neu receptor positive. Patient likely has 1 treatments to go one today. Discussed that Herceptin is given 110 milligrams per kilogram total dose. We'll check with pharmacy regarding the total dose of herceptin.   # Left breast fat necrosis-  Status post biopsy- Healing well.  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  # genetic testing- sister- 76s- breast cancer; check my risk genetic testing.   # Follow-up in 3-4  weeks with MUGA scan on july26th; NO labs.

## 2016-02-13 NOTE — Progress Notes (Signed)
Patient complains of left breast being sore.

## 2016-02-20 ENCOUNTER — Ambulatory Visit
Admission: RE | Admit: 2016-02-20 | Discharge: 2016-02-20 | Disposition: A | Discharge status: Home / Self Care | Payer: Medicare Other | Source: Ambulatory Visit | Attending: Oncology | Admitting: Oncology

## 2016-02-20 DIAGNOSIS — C50912 Malignant neoplasm of unspecified site of left female breast: Secondary | ICD-10-CM | POA: Insufficient documentation

## 2016-02-20 DIAGNOSIS — C773 Secondary and unspecified malignant neoplasm of axilla and upper limb lymph nodes: Secondary | ICD-10-CM | POA: Insufficient documentation

## 2016-02-20 MED ORDER — TECHNETIUM TC 99M-LABELED RED BLOOD CELLS IV KIT
20.6400 | PACK | Freq: Once | INTRAVENOUS | Status: AC | PRN
  Administered 2016-02-20: 20.64 via INTRAVENOUS

## 2016-02-21 ENCOUNTER — Ambulatory Visit: Payer: Medicare Other | Attending: Otolaryngology | Admitting: Physical Therapy

## 2016-02-21 ENCOUNTER — Encounter: Payer: Self-pay | Admitting: Physical Therapy

## 2016-02-21 DIAGNOSIS — R2681 Unsteadiness on feet: Secondary | ICD-10-CM | POA: Diagnosis not present

## 2016-02-21 DIAGNOSIS — R262 Difficulty in walking, not elsewhere classified: Secondary | ICD-10-CM | POA: Insufficient documentation

## 2016-02-21 DIAGNOSIS — M25551 Pain in right hip: Secondary | ICD-10-CM

## 2016-02-21 DIAGNOSIS — M6281 Muscle weakness (generalized): Secondary | ICD-10-CM | POA: Diagnosis present

## 2016-02-21 NOTE — Therapy (Signed)
[employer] MAIN [facility] SERVICES [address] [place], [place], [address] Phone: [phone]   Fax:  [phone]  Physical Therapy Evaluation  Patient Details  Name: [name] MRN: [id number] Date of Birth: 27-Jun-1941 Referring Provider: Dr. Pryor Ochoa  Encounter Date: 02/21/2016      PT End of Session - 02/21/16 1022    Visit Number 1   Number of Visits 13   Date for PT Re-Evaluation 04/03/16   Authorization Type Gcode 1   Authorization Time Period 10   PT Start Time [phone]   PT Stop Time 8916   PT Time Calculation (min) 59 min   Equipment Utilized During Treatment Gait belt   Activity Tolerance Patient tolerated treatment well;No increased pain      Past Medical History:  Diagnosis Date  . Arthritis   . Brain tumor ([place]) 1995   meningeoma  . Breast cancer of upper-inner quadrant of left female breast ([place]) 12/15/14   Completed radiation end of December and finished chemotherapy 2 weeks ago, Left breast invasive mammary carcinoma, T1cN80mc (1.5 cm); Grade 3, [name], [name], [name], .  .Marland KitchenCataract    bilat   . 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 Hearing loss   . History of cancer chemotherapy   . History of radiation therapy   . Imbalance   . Memory impairment    seen by Dr SManuella Ghazi possible post crainiotomy from radiation  . Numbness and tingling    right hand   . Shoulder pain, right   . Wears glasses     Past Surgical History:  Procedure Laterality Date  . APPENDECTOMY  1950  . BRAIN SURGERY  1995   left frontal/temporal  . BREAST BIOPSY Left 1997  . BREAST BIOPSY Left 12/15/14   confirmed DCIS  . BREAST BIOPSY Left 01/08/2015   Procedure: BREAST BIOPSY WITH NEEDLE LOCALIZATION;  Surgeon: [name] MD;  Location: [facility] ORS;  Service: General;  Laterality: Left;  . BREAST LUMPECTOMY Left 01/08/2015   Procedure: LUMPECTOMY;  Surgeon: [name] MD;   Location: [facility] ORS;  Service: General;  Laterality: Left;  . COLONOSCOPY  2010   Dr. ETiffany Kocher . PORTACATH PLACEMENT Right 01/16/2015   Procedure: INSERTION PORT-A-CATH;  Surgeon: [name] MD;  Location: [facility] ORS;  Service: General;  Laterality: Right;  . SENTINEL NODE BIOPSY Left 01/16/2015   Procedure: SENTINEL NODE BIOPSY;  Surgeon: [name] MD;  Location: [facility] ORS;  Service: General;  Laterality: Left;  . TOTAL HIP ARTHROPLASTY Right 09/04/2015   Procedure: RIGHT TOTAL HIP ARTHROPLASTY ANTERIOR APPROACH;  Surgeon: MParalee Cancel MD;  [facility];  Service: Orthopedics;  Laterality: Right;    There were no vitals filed for this visit.       Subjective Assessment - 02/21/16 0852    Subjective Patient is a 75y.o. female presenting to physical therapy with imbalance issues and fear of falling which patien states is secondary to inner ear issues. Patient states that she has had CA in the past with radiation and surgeries to address this. She had a menigioma 22 years ago and has recently finished chemotherapy (2 months ago)/radiation (Dec. 30, 2016) for breast cancer. Just previous to menigioma patient used to have double vision but reports that she no longer has this issue. Patient also states she has OA resulting in a R total hip  surgery in Feb. 2017. With her last neurological MD visist, MD suggested patient had oscillopsia, inner ear problems.  Paitent reports that standing and walking for long periods of time is difficult. She often feels the need to use her husband's hand and her [facility] in order to feel safe during ambulation.    Patient is accompained by: Family member   Pertinent History Factors Affecting History: PMH of CA, inner ear problems, and recent R THA, supportive husband/family, 19 stairs to go from downstairs to upstairs bedroom    Limitations Walking;Standing   How long can you sit comfortably? NA   How long can you stand comfortably? 10 minutes    How long  can you walk comfortably? 100 yds.   Patient Stated Goals Patient wishes to address balance and strength so she can walk more comfortably. Her husband wishes for her to gain confidence in her abilities to perform functional activities.    Currently in Pain? Yes   Pain Score 4    Pain Location Hip   Pain Orientation Right   Pain Descriptors / Indicators Discomfort   Pain Type Chronic pain;Surgical pain   Pain Onset More than a month ago   Pain Frequency Constant   Aggravating Factors  standing, walking   Pain Relieving Factors Aleve    Effect of Pain on Daily Activities Decreased ability to stand long periods of time            [facility] PT Assessment - 02/21/16 0001      Assessment   Medical Diagnosis Dizziness/Imbalance   Referring Provider Dr. Pryor Ochoa   Onset Date/Surgical Date --  Patient feels like it has progressed from 3 years previous   Hand Dominance Right   Next MD Visit Not scheduled   Prior Therapy For hip replacement, regained functional level;  no previous therapy for balance issues     Precautions   Precautions Fall     Restrictions   Weight Bearing Restrictions No     Balance Screen   Has the patient fallen in the past 6 months Yes   How many times? 1   Has the patient had a decrease in activity level because of a fear of falling?  Yes   Is the patient reluctant to leave their home because of a fear of falling?  Yes     [place] residence   Living Arrangements Spouse/significant other   Available Help at Discharge Family   Type of [place] to enter   Entrance Stairs-Number of Steps 3   Entrance Stairs-Rails Can reach both   [place] Two level   Alternate Level Stairs-Number of Steps 3 steps into family area from dining room, no handrails, will be getting handrails soon, 19 steps with landing and [place] - 2 wheels   Additional Comments Walk in shower;  reports no difficulties with navigating bathroom     Prior Function   Level of Independence Independent with household mobility with device;Independent with basic ADLs   Vocation Retired   [employer]   Leisure read, watching sports, knit     Cognition   Overall Cognitive Status Within Functional Limits for tasks assessed  Difficulties with short term memory     Observation/Other Assessments   Activities of Balance Confidence Scale (ABC Scale)  55.6%, <67% indicates increased risk for falls     Sensation  Light Touch Appears Intact   Additional Comments Patient states she has episodes on numbness down RLE , does not know when it comes off/on, intermittent, denies N/T today     Coordination   Gross Motor Movements are Fluid and Coordinated Yes   Fine Motor Movements are Fluid and Coordinated No   Finger Nose Finger Test Mild past pointing, BUE   Heel Shin Test Normal     Posture/Postural Control   Posture Comments Upright posture with mild increased thoracic kyphosis, rounded shoulders, and forward head posture     AROM   Overall AROM Comments Gross assessment of B UE/LE and cervical AROM is [facility]     Strength   Right Shoulder Flexion 4+/5   Right Shoulder Extension 4+/5   Right Shoulder ABduction 4/5   Left Shoulder Flexion 4+/5   Left Shoulder Extension 4+/5   Left Shoulder ABduction 3-/5   Right Elbow Flexion 4+/5   Right Elbow Extension 4+/5   Left Elbow Flexion 4+/5   Left Elbow Extension 4+/5   Right Hip Flexion 4-/5   Right Hip ABduction 4/5  seated   Left Hip Flexion 4-/5   Left Hip ABduction 4/5  seated   Right Knee Flexion 4+/5   Right Knee Extension 4+/5   Left Knee Flexion 4+/5   Left Knee Extension 4+/5   Right Ankle Dorsiflexion 5/5   Left Ankle Dorsiflexion 5/5     Transfers   Comments Able to transfer safely no AD, will and won't use BUE for transfers     Ambulation/Gait   Gait Comments SBQC, close supervision-CGA, Mild antalgic  gait on RLE with decreased RLE WB and stride length. With pointing out antalgic gait, patient is able to minimize but continues to have mild gait deviations as listed before     Standardized Balance Assessment   Five times sit to stand comments  12.8 seconds, >65 <14.2 seconds indicates decreased risk for falls   10 Meter Walk 0.83 m/s, close supervision, SBQC <1.0 indicates increased risk for falls, limited community ambulator     High Level Balance   High Level Balance Comments Sitting balance: Good; Standing balance eyes open firm/unstable surface: good, Eyes closed firm/unstabl surface increased sway with increase in fear of falling      Treatment:  Initiated HEP:  Instructed to perform in corner with chair in front and to decrease/stop if any increase in R hip pain.  Tandem stance, x15 seconds each LE leading, CGA assist, no HHA Single leg stance, each LE, x3 attempts, 1 HHA CGA assist, instructed to perform with non stance leg staying in mostly extended position due to patient reporting hamstring cramps with increased knee flexion.                      PT Education - 02/21/16 1021    Education provided Yes   Education Details HEP initiated   [employer]) Educated Patient;Spouse   Methods Explanation;Demonstration;Verbal cues   Comprehension Verbal cues required;Verbalized understanding;Returned demonstration             PT Long Term Goals - 02/21/16 1037      PT LONG TERM GOAL #1   Title Patient will improve functional gait assessment to >22/30 in order to decrease fear of falling and improve ease of community ambulation.   Time 6   Period Weeks   Status New     PT LONG TERM GOAL #2   Title Patient will be independent in  HEP in order to increase patient's ability to maintain gains achieved in therapy and assist with return to PLOF.    Time 6   Period Weeks   Status New     PT LONG TERM GOAL #3   Title Patient will increase overall strength to 4+/5  in BLE to increase ROM/strength throughout functional activities performed in her ADLs.    Time 6   Period Weeks   Status New     PT LONG TERM GOAL #4   Title Patient will increase her 10 m walk test time to >1.0 m/s in order to be a community ambulator at decreased risk for falls.    Time 6   Period Weeks   Status New     PT LONG TERM GOAL #5   Title Patient will report a worst pain in right hip of 3/10 in last few days to demonstrate improved tolerance with ADLs.   Time 6   Period Weeks   Status New               Plan - 02/21/16 1023    Clinical Impression Statement Patient is 75 y.o. female presenting to PT with increased imbalance and fear of falling. Patient has good general strength, 4-/5 to 5/5, in BLE. Patient is at falls risk based on ABC scale and 10 meter walk test, but not with 5x sit to stand. Patient reports limited ability to perform ADLs due to fear of falling and feeling of being off balance. She requires CGA-close supervision during testing. Static balance with eyes open is good, but patient has increased sway with CGA-min assist when performing static standing eyes closed, tandem stance, and single leg stance. Patient would benefit from skilled PT in order to address dynamic/static balance, increase functional independence, and increase confidence in her abilities to perform activities safely.    Rehab Potential Good   Clinical Impairments Affecting Rehab Potential Postive Factors: Good family support, motivated; Negative Factors: PMH including CA, OA, recent R THA; Clinical Impression: Evolving- falls risk, involved PMH, inner ear problems    PT Frequency 2x / week   PT Duration 6 weeks   PT Treatment/Interventions ADLs/Self Care Home Management;Biofeedback;Cryotherapy;Moist Heat;DME Instruction;Gait training;Stair training;Functional mobility training;Therapeutic activities;Therapeutic exercise;Balance training;Neuromuscular re-education;Patient/family  education;Manual techniques;Energy conservation;Vestibular   PT Next Visit Plan Functional Gait Assessment, increase balance activities, LE strengthening   PT Home Exercise Plan HEP initiated- see patient instructions   Consulted and Agree with Plan of Care Patient;Family member/caregiver      Patient will benefit from skilled therapeutic intervention in order to improve the following deficits and impairments:  Decreased activity tolerance, Decreased balance, Decreased coordination, Decreased endurance, Decreased mobility, Decreased safety awareness, Decreased strength, Difficulty walking, Hypomobility, Impaired sensation, Impaired UE functional use, Improper body mechanics, Postural dysfunction, Impaired flexibility, Pain  Visit Diagnosis: Unsteadiness on feet - Plan: PT plan of care cert/re-cert  Difficulty in walking, not elsewhere classified - Plan: PT plan of care cert/re-cert  Pain in right hip - Plan: PT plan of care cert/re-cert  Muscle weakness (generalized) - Plan: PT plan of care cert/re-cert      G-Codes - 93/73/42 1125    Functional Assessment Tool Used Gait ability, transfer ability, ABC scale, 10 meter walk;    Functional Limitation Mobility: Walking and moving around   Mobility: Walking and Moving Around Current Status [phone]) At least 20 percent but less than 40 percent impaired, limited or restricted   Mobility: Walking and Moving Around Goal Status [phone])  At least 1 percent but less than 20 percent impaired, limited or restricted       Problem List Patient Active Problem List   Diagnosis Date Noted  . Cancer of overlapping sites of left female breast ([place]) 01/23/2016  . Left breast mass 12/31/2015  . S/P right THA, AA 09/04/2015  . History of artificial joint 09/04/2015  . 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 Malignant neoplasm of female breast ([place]) 07/18/2015  . Neoplasm of meninges ([place]) 07/18/2015  . Breast cancer metastasized to  axillary lymph node ([place]) 01/24/2015  . DDD (degenerative disc disease), lumbar 01/02/2015  . HLD (hyperlipidemia) 01/02/2015  . Benign neoplasm of meninges ([place]) 01/02/2015  . Arthritis, degenerative 01/02/2015  . Psoriasis 01/02/2015  . Rotator cuff arthropathy 09/15/2012   Tilman Neat, SPT This entire session was performed under direct supervision and direction of a licensed therapist/therapist assistant . I have personally read, edited and approve of the note as written.  Trotter,Margaret PT, DPT 02/21/2016, 11:25 AM  [place] MAIN [facility] SERVICES [address] [place], [place], [address] Phone: [phone]   Fax:  [phone]  Name: SHUNDRA WIRSING MRN: [id number] Date of Birth: 1940/11/25

## 2016-02-21 NOTE — Patient Instructions (Addendum)
  Copyright  VHI. All rights reserved.  Tandem Walking   Stand beside kitchen sink and place one foot in front of the other, lift your hand and try to hold position for 10 sec. Repeat with other foot in front; Repeat 5 reps with each foot in front 5 days a week.  Balance: Unilateral   Attempt to balance on left leg, eyes open. Hold _5-10___ seconds.Start with holding onto counter and if you get your balance you can try to let go of counter. Repeat __5__ times per set. Do __1__ sets per session. Do __1__ sessions per day. Keep eyes open:   http://orth.exer.[email or web address]   Copyright  VHI. All rights reserved.

## 2016-02-22 ENCOUNTER — Encounter: Payer: Self-pay | Admitting: Radiation Oncology

## 2016-02-22 ENCOUNTER — Ambulatory Visit
Admission: RE | Admit: 2016-02-22 | Discharge: 2016-02-22 | Disposition: A | Discharge status: Home / Self Care | Payer: Medicare Other | Source: Ambulatory Visit | Attending: Radiation Oncology | Admitting: Radiation Oncology

## 2016-02-22 VITALS — BP 123/70 | HR 79 | Temp 97.1°F | Resp 20 | Wt 151.3 lb

## 2016-02-22 DIAGNOSIS — Z923 Personal history of irradiation: Secondary | ICD-10-CM | POA: Diagnosis not present

## 2016-02-22 DIAGNOSIS — Z171 Estrogen receptor negative status [ER-]: Secondary | ICD-10-CM | POA: Diagnosis not present

## 2016-02-22 DIAGNOSIS — C50912 Malignant neoplasm of unspecified site of left female breast: Secondary | ICD-10-CM | POA: Insufficient documentation

## 2016-02-22 DIAGNOSIS — C773 Secondary and unspecified malignant neoplasm of axilla and upper limb lymph nodes: Secondary | ICD-10-CM

## 2016-02-22 NOTE — Progress Notes (Signed)
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  REFERRING PROVIDER: [name], MD  DIAGNOSIS: T1cN2mcM0 invasive carcinoma left breast. ER-neg, PR-neg, HER2+positive TREATMENTS RENDERED:  1.BCS with sentinel node biopsy June 2016 [facility] 3. Herceptin maintenance - completed last month 4. RT completed Dec 2016  HPI: doing well now 7 months s/p completion of RT to whole breast and lymphatics. Saw medonc last week and discussed that she is likely done with herceptin - final dose calculation pending. Only complaint is consistent pain in UOQ of left breast that has not changed much since the postop period. She underwent recent biopsy of an a COMPLICATIONS OF TREATMENT: none  FOLLOW UP COMPLIANCE: keeps appointments   MEDS: reviewed and updated ALLERGIES: reviewed as documented  ROS: other than noted above her main complaints relate to her hip replacement and pain in the other hip.  EXAM: Vitals:   02/22/16 1016  BP: 123/70  Pulse: 79  Resp: 20  Temp: 97.1 F (36.2 C)   Awake, alert, NAD PERRL, EOMI, MMM, oral cavity clear Hearing decreased bilaterally No C/SCV/Axillary adenopathy Breast examined in the sitting and supine positions. Excellent cosmetic outcome. No rashes or skin changes. Port in place.  Right breast with no nodularity or masses Left breast - mild tenderness deep to the axillary scar and deep to the BCT scar. Along the ~6cm lumpectomy scar there is a 772mfirm scar/nodule at the medial aspect that she feels is unchanged. Additional fibrosis underlying the scar more laterally. This is not tender.  MEDICAL DECISION MAKING: PATH: reviewed path from June 2017 - no malignancy IMAGING: reviewed ultrasound from May 2017 and Mammogram from May 2017.   ASSESSMENT: 1. Breast ca - NED - likely done with  herceptin - awaiting confirmation from medical oncology. Will continue to monitor the nodularity underlying the lumpectomy scar. Recent biopsy (lateral to areola) was negative.  PLAN: 1. followup with radonc in 6 months. F/u with surgery and medonc as planned 2. Call with questions or concerns.  RaMallie DartingMD PhD

## 2016-02-25 ENCOUNTER — Encounter: Payer: Self-pay | Admitting: Physical Therapy

## 2016-02-25 ENCOUNTER — Ambulatory Visit: Payer: Medicare Other | Admitting: Physical Therapy

## 2016-02-25 DIAGNOSIS — M25551 Pain in right hip: Secondary | ICD-10-CM

## 2016-02-25 DIAGNOSIS — R2681 Unsteadiness on feet: Secondary | ICD-10-CM

## 2016-02-25 DIAGNOSIS — R262 Difficulty in walking, not elsewhere classified: Secondary | ICD-10-CM

## 2016-02-25 DIAGNOSIS — M6281 Muscle weakness (generalized): Secondary | ICD-10-CM

## 2016-02-25 NOTE — Therapy (Signed)
[place] MAIN [facility] SERVICES [address] [place], [place], [address] Phone: [phone]   Fax:  [phone]  Physical Therapy Treatment  Patient Details  Name: [name] MRN: [id number] Date of Birth: 01-Oct-1940 Referring Provider: Dr. Pryor Ochoa  Encounter Date: 02/25/2016      PT End of Session - 02/25/16 1358    Visit Number 2   Number of Visits 13   Date for PT Re-Evaluation 04/03/16   Authorization Type Gcode 2   Authorization Time Period 10   PT Start Time 1304   PT Stop Time 1345   PT Time Calculation (min) 41 min   Equipment Utilized During Treatment Gait belt   Activity Tolerance Patient tolerated treatment well;No increased pain      Past Medical History:  Diagnosis Date  . Arthritis   . 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  .Marland KitchenCataract    bilat   . 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 Hearing loss   . History of cancer chemotherapy   . History of radiation therapy   . Imbalance   . Memory impairment    seen by Dr SManuella Ghazi possible post crainiotomy from radiation  . Numbness and tingling    right hand   . Shoulder pain, right   . Wears glasses     Past Surgical History:  Procedure Laterality Date  . APPENDECTOMY  1950  . BRAIN SURGERY  1995   left frontal/temporal  . BREAST BIOPSY Left 1997  . BREAST BIOPSY Left 12/15/14   confirmed DCIS  . BREAST BIOPSY Left 01/08/2015   Procedure: BREAST BIOPSY WITH NEEDLE LOCALIZATION;  Surgeon: [name] MD;  Location: [facility] ORS;  Service: General;  Laterality: Left;  . BREAST LUMPECTOMY Left 01/08/2015   Procedure: LUMPECTOMY;  Surgeon: [name] MD;   Location: [facility] ORS;  Service: General;  Laterality: Left;  . COLONOSCOPY  2010   Dr. ETiffany Kocher . PORTACATH PLACEMENT Right 01/16/2015   Procedure: INSERTION PORT-A-CATH;  Surgeon: [name] MD;  Location: [facility] ORS;  Service: General;  Laterality: Right;  . SENTINEL NODE BIOPSY Left 01/16/2015   Procedure: SENTINEL NODE BIOPSY;  Surgeon: [name] MD;  Location: [facility] ORS;  Service: General;  Laterality: Left;  . TOTAL HIP ARTHROPLASTY Right 09/04/2015   Procedure: RIGHT TOTAL HIP ARTHROPLASTY ANTERIOR APPROACH;  Surgeon: MParalee Cancel MD;  Location: [facility];  Service: Orthopedics;  Laterality: Right;    There were no vitals filed for this visit.      Subjective Assessment - 02/25/16 1310    Subjective Patient reports continued R hip pain since her surgery. Patient reports she has been able to do exercises at home but states she has difficulty with them due to imbalance with no increase in her hip pain. Patient states she was attempting to take some steps in tandem stance, instructed that she is only supposed to stay stable in this position. Patient states she has one more treatment for CA next month. Patient states she feels unsteady when walking still. Patient denies falls.    Patient is accompained by: Family member   Pertinent History  Factors Affecting History: PMH of CA, inner ear problems, and recent R THA, supportive husband/family, 19 stairs to go from downstairs to upstairs bedroom    Limitations Walking;Standing   How long can you sit comfortably? NA   How long can you stand comfortably? 10 minutes    How long can you walk comfortably? 100 yds.   Patient Stated Goals Patient wishes to address balance and strength so she can walk more comfortably. Her husband wishes for her to gain confidence in her abilities to perform functional activities.    Currently in Pain? Yes   Pain Score 4    Pain Location Hip   Pain Orientation Right   Pain Descriptors / Indicators Sore    Pain Onset More than a month ago            [facility] PT Assessment - 02/25/16 0001      Functional Gait  Assessment   Gait Level Surface Walks 20 ft, slow speed, abnormal gait pattern, evidence for imbalance or deviates 10-15 in outside of the 12 in walkway width. Requires more than 7 sec to ambulate 20 ft.   Change in Gait Speed Able to change speed, demonstrates mild gait deviations, deviates 6-10 in outside of the 12 in walkway width, or no gait deviations, unable to achieve a major change in velocity, or uses a change in velocity, or uses an assistive device.   Gait with Horizontal Head Turns Performs head turns smoothly with slight change in gait velocity (eg, minor disruption to smooth gait path), deviates 6-10 in outside 12 in walkway width, or uses an assistive device.   Gait with Vertical Head Turns Performs task with moderate change in gait velocity, slows down, deviates 10-15 in outside 12 in walkway width but recovers, can continue to walk.   Gait and Pivot Turn Pivot turns safely in greater than 3 sec and stops with no loss of balance, or pivot turns safely within 3 sec and stops with mild imbalance, requires small steps to catch balance.   Step Over Obstacle Is able to step over one shoe box (4.5 in total height) but must slow down and adjust steps to clear box safely. May require verbal cueing.   Gait with Narrow Base of Support Ambulates less than 4 steps heel to toe or cannot perform without assistance.   Gait with Eyes Closed Walks 20 ft, uses assistive device, slower speed, mild gait deviations, deviates 6-10 in outside 12 in walkway width. Ambulates 20 ft in less than 9 sec but greater than 7 sec.   Ambulating Backwards Walks 20 ft, uses assistive device, slower speed, mild gait deviations, deviates 6-10 in outside 12 in walkway width.   Steps Alternating feet, must use rail.   Total Score 15       Treatment:  NuStep, L2 x 3 minutes. History taken throughout.  Patient  instructed in FGA with CGA-min assist for stability during activities. Patient used cane off/on during test. Patient instructed to use cane at home to decrease stress on R hip.  Patient noted no increase in R hip pain throughout activities and was given multiple rest breaks to decrease stress on R hip.   Seated hip ABD with red tband resistance, x5 reps, patient notes too easy.   Standing 4 way hip (no adduction) ABD, Flexion, and extension; 2 x10, red tband resistance, 2 HHA, patient notes difficulty due to weakness; min VCs to decrease trunk flexion/lateral flexion.  Static standing feet together on airex  pad, 2x15; CGA-min assist to maintain stability  Heel raises, 2x10, 2nd rep on airex pad, BUE light touch for stability, min VCs to increase eccentric control.  Marches, 2x10 on airex pad, min VCs to perform through limited range, light touch assist for stability, no increase in pain, CGA for safety.                         PT Education - 02/25/16 1358    Education provided Yes   Education Details stopping HEP, contacting MD    Person(s) Educated Patient   Methods Explanation;Verbal cues   Comprehension Verbalized understanding;Verbal cues required             PT Long Term Goals - 02/21/16 1037      PT LONG TERM GOAL #1   Title Patient will improve functional gait assessment to >22/30 in order to decrease fear of falling and improve ease of community ambulation.   Time 6   Period Weeks   Status New     PT LONG TERM GOAL #2   Title Patient will be independent in HEP in order to increase patient's ability to maintain gains achieved in therapy and assist with return to PLOF.    Time 6   Period Weeks   Status New     PT LONG TERM GOAL #3   Title Patient will increase overall strength to 4+/5 in BLE to increase ROM/strength throughout functional activities performed in her ADLs.    Time 6   Period Weeks   Status New     PT LONG TERM GOAL #4   Title  Patient will increase her 10 m walk test time to >1.0 m/s in order to be a community ambulator at decreased risk for falls.    Time 6   Period Weeks   Status New     PT LONG TERM GOAL #5   Title Patient will report a worst pain in right hip of 3/10 in last few days to demonstrate improved tolerance with ADLs.   Time 6   Period Weeks   Status New               Plan - 02/25/16 1359    Clinical Impression Statement Patient presents with continued R hip pain which she states has possibly increased but not much. During FGA, SPT noted "popping" in R hip. Patient was asked how long her hip has been doing this and if she remembered when it came on. She stated she didn't do anything specifically that had started it, but she noted that it was this past week that it began. She continues to have trendelenburg gait (slight increase since eval) towards her R side with "popping" occurring as she returns to neutral. Patient and husband instructed to notify her MD, and patient notified to discontinue her HEP until further instruction. Patient able to perform therapy with no increase in pain. "Popping" only occured with forward walking not backwards walking or during exercises. SPT focused on limited single leg support exercises to ensure limited stress on R hip. Patient would continue to benefit from skilled PT in order to increase functional independence, balance, strength, and decrease gait deviations.     Rehab Potential Good   Clinical Impairments Affecting Rehab Potential Postive Factors: Good family support, motivated; Negative Factors: PMH including CA, OA, recent R THA; Clinical Impression: Evolving- falls risk, involved PMH, inner ear problems    PT Frequency 2x / week  PT Duration 6 weeks   PT Treatment/Interventions ADLs/Self Care Home Management;Biofeedback;Cryotherapy;Moist Heat;DME Instruction;Gait training;Stair training;Functional mobility training;Therapeutic activities;Therapeutic  exercise;Balance training;Neuromuscular re-education;Patient/family education;Manual techniques;Energy conservation;Vestibular   PT Next Visit Plan re-check hip "popping" and MDs thoughts, balance, LE strengthening   PT Home Exercise Plan HEP on hold until further instruction   Consulted and Agree with Plan of Care Patient;Family member/caregiver      Patient will benefit from skilled therapeutic intervention in order to improve the following deficits and impairments:  Decreased activity tolerance, Decreased balance, Decreased coordination, Decreased endurance, Decreased mobility, Decreased safety awareness, Decreased strength, Difficulty walking, Hypomobility, Impaired sensation, Impaired UE functional use, Improper body mechanics, Postural dysfunction, Impaired flexibility, Pain  Visit Diagnosis: Unsteadiness on feet  Pain in right hip  Difficulty in walking, not elsewhere classified  Muscle weakness (generalized)     Problem List Patient Active Problem List   Diagnosis Date Noted  . Cancer of overlapping sites of left female breast ([place]) 01/23/2016  . Left breast mass 12/31/2015  . S/P right THA, AA 09/04/2015  . History of artificial joint 09/04/2015  . 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 Malignant neoplasm of female breast ([place]) 07/18/2015  . Neoplasm of meninges ([place]) 07/18/2015  . Breast cancer metastasized to axillary lymph node ([place]) 01/24/2015  . DDD (degenerative disc disease), lumbar 01/02/2015  . HLD (hyperlipidemia) 01/02/2015  . Benign neoplasm of meninges ([place]) 01/02/2015  . Arthritis, degenerative 01/02/2015  . Psoriasis 01/02/2015  . Rotator cuff arthropathy 09/15/2012   Tilman Neat, SPT This entire session was performed under direct supervision and direction of a licensed therapist/therapist assistant . I have personally read, edited and approve of the note as written.  Trotter,Margaret PT, DPT 02/25/2016, 3:18 PM  [place] MAIN [facility] SERVICES [address] [place], [place], [address] Phone: [phone]   Fax:  [phone]  Name: [name] MRN: [id number] Date of Birth: 1941/05/12

## 2016-02-27 ENCOUNTER — Ambulatory Visit: Payer: Medicare Other | Attending: Otolaryngology

## 2016-02-27 DIAGNOSIS — M6281 Muscle weakness (generalized): Secondary | ICD-10-CM | POA: Diagnosis present

## 2016-02-27 DIAGNOSIS — R2681 Unsteadiness on feet: Secondary | ICD-10-CM | POA: Diagnosis not present

## 2016-02-27 DIAGNOSIS — R262 Difficulty in walking, not elsewhere classified: Secondary | ICD-10-CM

## 2016-02-27 DIAGNOSIS — M25551 Pain in right hip: Secondary | ICD-10-CM

## 2016-02-27 NOTE — Therapy (Signed)
[place] MAIN [facility] SERVICES [address] [place], [place], [address] Phone: [phone]   Fax:  [phone]  Physical Therapy Treatment  Patient Details  Name: [name] MRN: [id number] Date of Birth: [birth year] Referring Provider: Dr. Pryor Ochoa  Encounter Date: 02/27/2016      PT End of Session - 02/27/16 1614    Visit Number 3   Number of Visits 13   Date for PT Re-Evaluation 04/03/16   Authorization Type Gcode 3   Authorization Time Period 10   PT Start Time 1603   PT Stop Time 2683   PT Time Calculation (min) 42 min   Equipment Utilized During Treatment Gait belt   Activity Tolerance Patient tolerated treatment well;No increased pain      Past Medical History:  Diagnosis Date  . Arthritis   . 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  .Marland KitchenCataract    bilat   . 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 Hearing loss   . History of cancer chemotherapy   . History of radiation therapy   . Imbalance   . Memory impairment    seen by Dr SManuella Ghazi possible post crainiotomy from radiation  . Numbness and tingling    right hand   . Shoulder pain, right   . Wears glasses     Past Surgical History:  Procedure Laterality Date  . APPENDECTOMY  1950  . BRAIN SURGERY  1995   left frontal/temporal  . BREAST BIOPSY Left 1997  . BREAST BIOPSY Left 12/15/14   confirmed DCIS  . BREAST BIOPSY Left 01/08/2015   Procedure: BREAST BIOPSY WITH NEEDLE LOCALIZATION;  Surgeon: [name] MD;  Location: [facility] ORS;  Service: General;  Laterality: Left;  . BREAST LUMPECTOMY Left 01/08/2015   Procedure: LUMPECTOMY;  Surgeon: [name] MD;  Location:  [facility] ORS;  Service: General;  Laterality: Left;  . COLONOSCOPY  2010   Dr. ETiffany Kocher . PORTACATH PLACEMENT Right 01/16/2015   Procedure: INSERTION PORT-A-CATH;  Surgeon: [name] MD;  Location: [facility] ORS;  Service: General;  Laterality: Right;  . SENTINEL NODE BIOPSY Left 01/16/2015   Procedure: SENTINEL NODE BIOPSY;  Surgeon: [name] MD;  Location: [facility] ORS;  Service: General;  Laterality: Left;  . TOTAL HIP ARTHROPLASTY Right 09/04/2015   Procedure: RIGHT TOTAL HIP ARTHROPLASTY ANTERIOR APPROACH;  Surgeon: MParalee Cancel MD;  Location: [facility];  Service: Orthopedics;  Laterality: Right;    There were no vitals filed for this visit.      Subjective Assessment - 02/27/16 1613    Subjective Pt states she is going to see her doctor Tuesday 8/9 about the popping in her hip.   Patient is accompained by: Family member   Pertinent History Factors Affecting History: PMH of CA, inner ear problems, and recent R THA, supportive husband/family, 19 stairs to go from downstairs to upstairs bedroom    Limitations Walking;Standing   How long can you sit comfortably? NA   How long can you stand comfortably? 10 minutes    How long can you walk comfortably? 100 yds.   Patient Stated Goals Patient wishes to  address balance and strength so she can walk more comfortably. Her husband wishes for her to gain confidence in her abilities to perform functional activities.    Currently in Pain? Yes   Pain Score 3    Pain Location Hip   Pain Orientation Right   Pain Descriptors / Indicators Sore   Pain Onset More than a month ago      NMR: Side stepping on long airex x 10 laps; min A throughout for balance, increased posterior weight shift Small rocker board maintaining in middle, making rainbows and bil trunk rotations with small red weighted ball, 2x10 each; CGA - min A for balance, increased posterior weight shift throughout Bil tandem stance on airex with EO and no UE support, 10x15 sec holds  each; CGA - min A for balance, increased difficulty with RLE back Bil march on airex pad with no UE support, 2x10; CGA for balance Bil stance on  foam roll with dual task (serial 7's and serial 3's) x 1 each; min A for balance; increased trunk extension to maintain balance Bil tandem stance on  foam roll (upside down) x 1 min each with no UE support; CGA - min A for balance, increased trunk extension for balance        PT Long Term Goals - 02/21/16 1037      PT LONG TERM GOAL #1   Title Patient will improve functional gait assessment to >22/30 in order to decrease fear of falling and improve ease of community ambulation.   Time 6   Period Weeks   Status New     PT LONG TERM GOAL #2   Title Patient will be independent in HEP in order to increase patient's ability to maintain gains achieved in therapy and assist with return to PLOF.    Time 6   Period Weeks   Status New     PT LONG TERM GOAL #3   Title Patient will increase overall strength to 4+/5 in BLE to increase ROM/strength throughout functional activities performed in her ADLs.    Time 6   Period Weeks   Status New     PT LONG TERM GOAL #4   Title Patient will increase her 10 m walk test time to >1.0 m/s in order to be a community ambulator at decreased risk for falls.    Time 6   Period Weeks   Status New     PT LONG TERM GOAL #5   Title Patient will report a worst pain in right hip of 3/10 in last few days to demonstrate improved tolerance with ADLs.   Time 6   Period Weeks   Status New               Plan - 02/27/16 1804    Clinical Impression Statement Pt reported that she made an appointment with her doctor for Tuesday regarding her R hip popping. She tolerated progressed balance activities this date. She had the most difficulty with dynamic balance with UE movements and dual task. She demonstrated increased trunk extension to compensate for LOB during balance exercises. Pt needs continued skilled PT  intervention to maximize overall function and decrease risk of falls.   Rehab Potential Good   Clinical Impairments Affecting Rehab Potential Postive Factors: Good family support, motivated; Negative Factors: PMH including CA, OA, recent R THA; Clinical Impression: Evolving- falls risk, involved PMH, inner ear problems    PT Frequency 2x / week   PT Duration 6 weeks  PT Treatment/Interventions ADLs/Self Care Home Management;Biofeedback;Cryotherapy;Moist Heat;DME Instruction;Gait training;Stair training;Functional mobility training;Therapeutic activities;Therapeutic exercise;Balance training;Neuromuscular re-education;Patient/family education;Manual techniques;Energy conservation;Vestibular   PT Next Visit Plan re-check hip "popping" and MDs thoughts, balance, LE strengthening   PT Home Exercise Plan HEP on hold until further instruction   Consulted and Agree with Plan of Care Patient;Family member/caregiver      Patient will benefit from skilled therapeutic intervention in order to improve the following deficits and impairments:  Decreased activity tolerance, Decreased balance, Decreased coordination, Decreased endurance, Decreased mobility, Decreased safety awareness, Decreased strength, Difficulty walking, Hypomobility, Impaired sensation, Impaired UE functional use, Improper body mechanics, Postural dysfunction, Impaired flexibility, Pain  Visit Diagnosis: Unsteadiness on feet  Pain in right hip  Difficulty in walking, not elsewhere classified  Muscle weakness (generalized)     Problem List Patient Active Problem List   Diagnosis Date Noted  . Cancer of overlapping sites of left female breast ([place]) 01/23/2016  . Left breast mass 12/31/2015  . S/P right THA, AA 09/04/2015  . History of artificial joint 09/04/2015  . 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 Malignant neoplasm of female breast ([place]) 07/18/2015  . Neoplasm of meninges ([place]) 07/18/2015  .  Breast cancer metastasized to axillary lymph node ([place]) 01/24/2015  . DDD (degenerative disc disease), lumbar 01/02/2015  . HLD (hyperlipidemia) 01/02/2015  . Benign neoplasm of meninges ([place]) 01/02/2015  . Arthritis, degenerative 01/02/2015  . Psoriasis 01/02/2015  . Rotator cuff arthropathy 09/15/2012   Geraldine Solar, SPT This entire session was performed under direct supervision and direction of a licensed therapist/therapist assistant . I have personally read, edited and approve of the note as written. Gorden Harms. Tortorici, PT, DPT [phone]  Tortorici,Ashley 02/28/2016, 10:59 AM  [place] MAIN [facility] SERVICES [address] [place], [place], [address] Phone: [phone]   Fax:  [phone]  Name: DONETTA ISAZA MRN: [id number] Date of Birth: 05/15/1941

## 2016-03-03 ENCOUNTER — Ambulatory Visit: Payer: Medicare Other | Admitting: Physical Therapy

## 2016-03-03 ENCOUNTER — Encounter: Payer: Self-pay | Admitting: Physical Therapy

## 2016-03-03 DIAGNOSIS — M6281 Muscle weakness (generalized): Secondary | ICD-10-CM

## 2016-03-03 DIAGNOSIS — R2681 Unsteadiness on feet: Secondary | ICD-10-CM | POA: Diagnosis not present

## 2016-03-03 DIAGNOSIS — M25551 Pain in right hip: Secondary | ICD-10-CM

## 2016-03-03 DIAGNOSIS — R262 Difficulty in walking, not elsewhere classified: Secondary | ICD-10-CM

## 2016-03-03 NOTE — Therapy (Signed)
[place] MAIN [facility] SERVICES [address] [place], [place], [address] Phone: [phone]   Fax:  [phone]  Physical Therapy Treatment  Patient Details  Name: [name] MRN: [id number] Date of Birth: 1941/02/25 Referring Provider: Dr. Pryor Ochoa  Encounter Date: 03/03/2016      PT End of Session - 03/03/16 1355    Visit Number 4   Number of Visits 13   Date for PT Re-Evaluation 04/03/16   Authorization Type Gcode 4   Authorization Time Period 10   PT Start Time 1308   PT Stop Time 1346   PT Time Calculation (min) 38 min   Equipment Utilized During Treatment Gait belt   Activity Tolerance Patient tolerated treatment well;No increased pain      Past Medical History:  Diagnosis Date  . Arthritis   . 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 Breast cancer of upper-inner quadrant of left female breast ([place]) 12/15/14   Completed radiation end of December and finished chemotherapy 2 weeks ago, Left breast invasive mammary carcinoma, T1cN70mc (1.5 cm); Grade 3, [name], [name], [name], .  .Marland KitchenCataract    bilat   . 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 Hearing loss   . History of cancer chemotherapy   . History of radiation therapy   . Imbalance   . Memory impairment    seen by Dr SManuella Ghazi possible post crainiotomy from radiation  . Numbness and tingling    right hand   . Shoulder pain, right   . Wears glasses     Past Surgical History:  Procedure Laterality Date  . APPENDECTOMY  1950  . BRAIN SURGERY  1995   left frontal/temporal  . BREAST BIOPSY Left 1997  . BREAST BIOPSY Left 12/15/14   confirmed DCIS  . BREAST BIOPSY Left 01/08/2015   Procedure: BREAST BIOPSY WITH NEEDLE LOCALIZATION;  Surgeon: [name] MD;  Location: [facility] ORS;  Service: General;  Laterality: Left;  . BREAST LUMPECTOMY Left 01/08/2015   Procedure: LUMPECTOMY;  Surgeon: [name] MD;  Location:  [facility] ORS;  Service: General;  Laterality: Left;  . COLONOSCOPY  2010   Dr. ETiffany Kocher . PORTACATH PLACEMENT Right 01/16/2015   Procedure: INSERTION PORT-A-CATH;  Surgeon: [name] MD;  Location: [facility] ORS;  Service: General;  Laterality: Right;  . SENTINEL NODE BIOPSY Left 01/16/2015   Procedure: SENTINEL NODE BIOPSY;  Surgeon: [name] MD;  Location: [facility] ORS;  Service: General;  Laterality: Left;  . TOTAL HIP ARTHROPLASTY Right 09/04/2015   Procedure: RIGHT TOTAL HIP ARTHROPLASTY ANTERIOR APPROACH;  Surgeon: MParalee Cancel MD;  Location: [facility];  Service: Orthopedics;  Laterality: Right;    There were no vitals filed for this visit.      Subjective Assessment - 03/03/16 1352    Subjective Patient has MD appointment about hip "popping" during ambulation tomorrow at 9 am 8/9. Patient has a mild increase in pain that doesn't seem to increase/decrease based on activity level.  Patient states she is diong well otherwise.   Patient is accompained by: Family member   Pertinent History Factors Affecting History: PMH of CA, inner ear problems, and recent R THA, supportive husband/family, 19 stairs to go from downstairs to upstairs bedroom    Limitations Walking;Standing   How long can you sit comfortably? NA   How long can you stand comfortably?  10 minutes    How long can you walk comfortably? 100 yds.   Patient Stated Goals Patient wishes to address balance and strength so she can walk more comfortably. Her husband wishes for her to gain confidence in her abilities to perform functional activities.    Currently in Pain? Yes   Pain Score 5    Pain Location Hip   Pain Orientation Right   Pain Descriptors / Indicators Discomfort   Pain Onset More than a month ago     Treatment:  TherEx: Lateral walking, x4 passes of ~10 feet each LE leading, light 1 HHA, CGA for safety Standing hip ABD, 3x10 with rests, patient reports difficulty, 1 HHA, min VCs to decrease L lateral trunk  flexion  Neuromuscular Re-ed: 1/2 bolster anterior/posterior tilts and static standing (flat side up), 2 x10 each, statically holding ~20 seconds, no HHA Toe taps onto dyandisc, 2x10 Airex pad marches and heel raises, 2x10, no HHA Static feet apart, wide semi tandem (each LE leading) stance, x20 seconds each, each done eyes open and closed. Feet together, wide semi tandem (each LE leading), x10 BUE forward flexion with 1.5# weight.  Wide semi tandem, x10 each LE leading, with BUE ball self toss and catch, mod assist for stability.   With balance activities, patient would not use HHA but would touch down with 2 HHA when losing balance.  Gait Training: ~40 feet x2, CGA, SBQC, min VCs to decrease R lateral trunk lean to decrease Trendelenburg and hip popping.  Min VCs to use SBQC to decrease stress placed on R hip.   During balance activities and ambulation, patient requires min assist and x1 to regain and maintain balance.                              PT Education - 03/03/16 1354    Education provided Yes   Education Details again discontinuing HEP, ankle and hip strategies for balance   Person(s) Educated Patient   Methods Explanation;Verbal cues   Comprehension Verbal cues required;Verbalized understanding             PT Long Term Goals - 02/21/16 1037      PT LONG TERM GOAL #1   Title Patient will improve functional gait assessment to >22/30 in order to decrease fear of falling and improve ease of community ambulation.   Time 6   Period Weeks   Status New     PT LONG TERM GOAL #2   Title Patient will be independent in HEP in order to increase patient's ability to maintain gains achieved in therapy and assist with return to PLOF.    Time 6   Period Weeks   Status New     PT LONG TERM GOAL #3   Title Patient will increase overall strength to 4+/5 in BLE to increase ROM/strength throughout functional activities performed in her ADLs.    Time 6    Period Weeks   Status New     PT LONG TERM GOAL #4   Title Patient will increase her 10 m walk test time to >1.0 m/s in order to be a community ambulator at decreased risk for falls.    Time 6   Period Weeks   Status New     PT LONG TERM GOAL #5   Title Patient will report a worst pain in right hip of 3/10 in last few days to demonstrate improved tolerance with  ADLs.   Time 6   Period Weeks   Status New               Plan - 03/03/16 1356    Clinical Impression Statement Patient reported she has a MD appointment tomorrow to address "popping" hip. Patient does not have hip popping throughout treatment except with R wide semi tandem stance with trunk extension and ambulation. Popping does decrease with min VCs to put increased weight through cane in LUE and to decrease Trendelenburg gait on the R. Patient's pain does not change throughout the session or during any specific activity. Patient would benefit from skilled PT in order to address pain, balance, and improve functional ability with decreased risk for falls.   Rehab Potential Good   Clinical Impairments Affecting Rehab Potential Postive Factors: Good family support, motivated; Negative Factors: PMH including CA, OA, recent R THA; Clinical Impression: Evolving- falls risk, involved PMH, inner ear problems    PT Frequency 2x / week   PT Duration 6 weeks   PT Treatment/Interventions ADLs/Self Care Home Management;Biofeedback;Cryotherapy;Moist Heat;DME Instruction;Gait training;Stair training;Functional mobility training;Therapeutic activities;Therapeutic exercise;Balance training;Neuromuscular re-education;Patient/family education;Manual techniques;Energy conservation;Vestibular   PT Next Visit Plan re-check hip "popping" and MDs thoughts, balance, LE strengthening   PT Home Exercise Plan HEP on hold until further instruction   Consulted and Agree with Plan of Care Patient;Family member/caregiver      Patient will benefit  from skilled therapeutic intervention in order to improve the following deficits and impairments:  Decreased activity tolerance, Decreased balance, Decreased coordination, Decreased endurance, Decreased mobility, Decreased safety awareness, Decreased strength, Difficulty walking, Hypomobility, Impaired sensation, Impaired UE functional use, Improper body mechanics, Postural dysfunction, Impaired flexibility, Pain  Visit Diagnosis: Unsteadiness on feet  Pain in right hip  Difficulty in walking, not elsewhere classified  Muscle weakness (generalized)     Problem List Patient Active Problem List   Diagnosis Date Noted  . Cancer of overlapping sites of left female breast ([place]) 01/23/2016  . Left breast mass 12/31/2015  . S/P right THA, AA 09/04/2015  . History of artificial joint 09/04/2015  . 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 Malignant neoplasm of female breast ([place]) 07/18/2015  . Neoplasm of meninges ([place]) 07/18/2015  . Breast cancer metastasized to axillary lymph node ([place]) 01/24/2015  . DDD (degenerative disc disease), lumbar 01/02/2015  . HLD (hyperlipidemia) 01/02/2015  . Benign neoplasm of meninges ([place]) 01/02/2015  . Arthritis, degenerative 01/02/2015  . Psoriasis 01/02/2015  . Rotator cuff arthropathy 09/15/2012   Tilman Neat, SPT This entire session was performed under direct supervision and direction of a licensed therapist/therapist assistant . I have personally read, edited and approve of the note as written.  Trotter,Margaret PT, DPT 03/03/2016, 4:22 PM  [place] MAIN [facility] SERVICES [address] [place], [place], [address] Phone: [phone]   Fax:  [phone]  Name: [name] MRN: [id number] Date of Birth: 1940/10/06

## 2016-03-04 DIAGNOSIS — M7071 Other bursitis of hip, right hip: Secondary | ICD-10-CM | POA: Insufficient documentation

## 2016-03-04 DIAGNOSIS — Z96649 Presence of unspecified artificial hip joint: Secondary | ICD-10-CM

## 2016-03-04 HISTORY — DX: Presence of unspecified artificial hip joint: Z96.649

## 2016-03-05 ENCOUNTER — Inpatient Hospital Stay: Payer: Medicare Other | Attending: Internal Medicine | Admitting: Internal Medicine

## 2016-03-05 ENCOUNTER — Ambulatory Visit: Payer: Medicare Other | Admitting: Physical Therapy

## 2016-03-05 VITALS — BP 124/79 | HR 84 | Temp 97.6°F | Resp 18 | Wt 151.5 lb

## 2016-03-05 DIAGNOSIS — Z171 Estrogen receptor negative status [ER-]: Secondary | ICD-10-CM | POA: Diagnosis not present

## 2016-03-05 DIAGNOSIS — Z79899 Other long term (current) drug therapy: Secondary | ICD-10-CM

## 2016-03-05 DIAGNOSIS — M1990 Unspecified osteoarthritis, unspecified site: Secondary | ICD-10-CM | POA: Diagnosis not present

## 2016-03-05 DIAGNOSIS — C50812 Malignant neoplasm of overlapping sites of left female breast: Secondary | ICD-10-CM | POA: Diagnosis present

## 2016-03-05 DIAGNOSIS — Z923 Personal history of irradiation: Secondary | ICD-10-CM | POA: Diagnosis not present

## 2016-03-05 DIAGNOSIS — R42 Dizziness and giddiness: Secondary | ICD-10-CM | POA: Insufficient documentation

## 2016-03-05 DIAGNOSIS — Z9221 Personal history of antineoplastic chemotherapy: Secondary | ICD-10-CM | POA: Diagnosis not present

## 2016-03-05 DIAGNOSIS — C773 Secondary and unspecified malignant neoplasm of axilla and upper limb lymph nodes: Secondary | ICD-10-CM | POA: Insufficient documentation

## 2016-03-05 DIAGNOSIS — Z87891 Personal history of nicotine dependence: Secondary | ICD-10-CM | POA: Insufficient documentation

## 2016-03-05 MED ORDER — NERATINIB MALEATE 40 MG PO TABS: 240.0000 mg | ORAL_TABLET | ORAL | 3 refills | Status: DC

## 2016-03-05 NOTE — Progress Notes (Signed)
[name] OFFICE PROGRESS NOTE  Patient Care Team: [name], [name], MD (General Surgery) Self Requested  Breast cancer metastasized to axillary lymph node [name])   Staging form: Breast, [name], [name], [name], MD on 01/29/2015    Oncology History   # June 2016- LEFT BREAST CA;  [name] ; [name] ; [name]; Her 2 Neu POS; [place] from July OF 2016; [name]; adjuvant Herceptin [? Finished July 2017];   # June 2017- left breast Bx- fat necrosis [[name]  # MUGA scan- July 28th 2017- 67%.  # chronic gait/balance issues     Breast cancer metastasized to axillary lymph node ([place])   01/24/2015 Initial Diagnosis    Breast cancer metastasized to axillary lymph node      Cancer of overlapping sites of left female breast ([name])   01/23/2016 Initial Diagnosis    Cancer of overlapping sites of left female breast [facility])       INTERVAL HISTORY:  [name] 75 y.o.  female pleasant patient above history of  Stage I  ER/[name] HER-2/neu positive breast cancer on Herceptin-  Adjuvant is here for follow-up. She finished her last dose of Herceptin 3 weeks ago.  She is city review the results of her MUGA scan/ genetic testing.   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 No falls. She walks with cane.   Denies any unusual shortness of breath or chest pain. No cough. No swelling in legs.   No headaches or vision changes.  REVIEW OF SYSTEMS:  A complete 10 point review of system is done which is negative except mentioned above/history of present illness.   PAST MEDICAL HISTORY :  Past Medical History:  Diagnosis Date  . Arthritis   . 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 Breast cancer of upper-inner quadrant of left female breast  ([name]) 12/15/14   Completed radiation end of December and finished chemotherapy 2 weeks ago, Left breast invasive mammary carcinoma, T1cN71mc (1.5 cm); Grade 3, [name], [name], [name], .  .Marland KitchenCataract    bilat   . 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 Hearing loss   . History of cancer chemotherapy   . History of radiation therapy   . Imbalance   . Memory impairment    seen by Dr SManuella Ghazi possible post crainiotomy from radiation  . Numbness and tingling    right hand   . Shoulder pain, right   . Wears glasses     PAST SURGICAL HISTORY :   Past Surgical History:  Procedure Laterality Date  . APPENDECTOMY  1950  . BRAIN SURGERY  1995   left frontal/temporal  . BREAST BIOPSY Left 1997  . BREAST BIOPSY Left 12/15/14   confirmed DCIS  . BREAST BIOPSY Left 01/08/2015   Procedure: BREAST BIOPSY WITH NEEDLE LOCALIZATION;  Surgeon: [name] MD;  Location: [facility] ORS;  Service: General;  Laterality: Left;  . BREAST LUMPECTOMY Left 01/08/2015   Procedure: LUMPECTOMY;  Surgeon: [name] MD;  Location: [facility] ORS;  Service: General;  Laterality: Left;  . COLONOSCOPY  2010   Dr. ETiffany Kocher . PORTACATH  PLACEMENT Right 01/16/2015   Procedure: INSERTION PORT-A-CATH;  Surgeon: [name], MD;  Location: [facility] ORS;  Service: General;  Laterality: Right;  . SENTINEL NODE BIOPSY Left 01/16/2015   Procedure: SENTINEL NODE BIOPSY;  Surgeon: [name], MD;  Location: [facility] OR[facility];  Service: General;  Laterality: Left;  . TOTAL HIP ARTHROPLASTY Right 09/04/2015   Procedure: RIGHT TOTAL HIP ARTHROPLASTY ANTERIOR APPROACH;  Surgeon: [name], MD;  Location: [facility];  Service: Orthopedics;  Laterality: Right;    FAMILY HISTORY :   Family History  Problem Relation Age of Onset  . Breast cancer Sister 37  . Addison's disease Mother   . Stroke Father     SOCIAL HISTORY:   Social History  Substance Use Topics  . Smoking status: Former  Smoker    Packs/day: 0.25    Years: 5.00    Types: Cigarettes    Quit date: 07/28/1972  . Smokeless tobacco: Never Used  . Alcohol use 0.6 - 1.2 oz/week    1 - 2 Glasses of wine per week     Comment: 1 Glass Wine / Night    ALLERGIES:  is allergic to donepezil.  MEDICATIONS:  Current Outpatient Prescriptions  Medication Sig Dispense Refill  . Ascorbic Acid (VITAMIN C) 100 MG tablet Take 100 mg by mouth every morning.     . Calcium Carbonate-Vit D-Min (CALTRATE 600+D PLUS MINERALS) 600-800 MG-UNIT TABS Take 1 tablet by mouth every morning.    . ferrous sulfate 325 (65 FE) MG tablet Take 1 tablet (325 mg total) by mouth 3 (three) times daily after meals.  3  . lidocaine-prilocaine (EMLA) cream Apply 1 application topically as needed. 30 g 3  . loratadine (CLARITIN) 10 MG tablet Take 10 mg by mouth 2 (two) times daily.    . naproxen sodium (ANAPROX) 220 MG tablet Take 220 mg by mouth 2 (two) times daily with a meal.    . polyethylene glycol (MIRALAX / GLYCOLAX) packet Take 17 g by mouth 2 (two) times daily. 14 each 0  . polyvinyl alcohol (LIQUIFILM TEARS) 1.4 % ophthalmic solution Place 1 drop into both eyes daily.     . traMADol (ULTRAM) 50 MG tablet     . meloxicam (MOBIC) 15 MG tablet     . Neratinib Maleate 40 MG TABS Take 240 mg by mouth daily. 180 tablet 3   No current facility-administered medications for this visit.     PHYSICAL EXAMINATION: ECOG PERFORMANCE STATUS: 1 - Symptomatic but completely ambulatory  BP 124/79 (BP Location: Right Arm, Patient Position: Sitting)   Pulse 84   Temp 97.6 F (36.4 C) (Tympanic)   Resp 18   Wt 151 lb 8 oz (68.7 kg)   BMI 26.00 kg/m   Filed Weights   03/05/16 1029  Weight: 151 lb 8 oz (68.7 kg)    GENERAL: Well-nourished well-developed; Alert, no distress and comfortable.  With her husband.  Walks with a cane. EYES: no pallor or icterus OROPHARYNX: no thrush or ulceration; good dentition  NECK: supple, no masses felt LYMPH:  no  palpable lymphadenopathy in the cervical, axillary or inguinal regions LUNGS: clear to auscultation and  No wheeze or crackles HEART/CVS: regular rate & rhythm and no murmurs; No lower extremity edema ABDOMEN:abdomen soft, non-tender and normal bowel sounds Musculoskeletal:no cyanosis of digits and no clubbing  PSYCH: alert & oriented x 3 with fluent speech NEURO: no focal motor/sensory deficits SKIN:  no rashes or significant lesions  LABORATORY DATA:  I have reviewed the data as listed    Component Value Date/Time   NA 132 (L) 02/13/2016 0908   K 3.8 02/13/2016 0908   CL 96 (L) 02/13/2016 0908   CO2 26 02/13/2016 0908   GLUCOSE 168 (H) 02/13/2016 0908   BUN 16 02/13/2016 0908   CREATININE 0.85 02/13/2016 0908   CALCIUM 8.6 (L) 02/13/2016 0908   PROT 6.7 02/13/2016 0908   ALBUMIN 4.2 02/13/2016 0908   AST 27 02/13/2016 0908   ALT 18 02/13/2016 0908   ALKPHOS 64 02/13/2016 0908   BILITOT 0.7 02/13/2016 0908   GFRNONAA >60 02/13/2016 0908   GFRAA >60 02/13/2016 0908    No results found for: SPEP, UPEP  Lab Results  Component Value Date   WBC 5.7 02/13/2016   NEUTROABS 4.6 02/13/2016   HGB 13.0 02/13/2016   HCT 37.7 02/13/2016   MCV 89.4 02/13/2016   PLT 302 02/13/2016      Chemistry      Component Value Date/Time   NA 132 (L) 02/13/2016 0908   K 3.8 02/13/2016 0908   CL 96 (L) 02/13/2016 0908   CO2 26 02/13/2016 0908   BUN 16 02/13/2016 0908   CREATININE 0.85 02/13/2016 0908      Component Value Date/Time   CALCIUM 8.6 (L) 02/13/2016 0908   ALKPHOS 64 02/13/2016 0908   AST 27 02/13/2016 0908   ALT 18 02/13/2016 0908   BILITOT 0.7 02/13/2016 0908     No  focal wall motion abnormality of the left ventricle. Calculated left ventricular ejection fraction equals 67%. Previously 61% on 11/13/2015 IMPRESSION: Left ventricular ejection fraction equals 67 %. Electronically Signed   By: Suzy Bouchard M.D.   On: 02/20/2016 15:02  RADIOGRAPHIC STUDIES: I  have personally reviewed the radiological images as listed and agreed with the findings in the report. No results found.   ASSESSMENT & PLAN:  Cancer of overlapping sites of left female breast ([place]) # invasive carcinoma of breast T1c n1MIC M0; Estrogen receptor negative.  Progesterone receptor negative.  HER-2/neu receptor positive. S/p- 149m/kg [finished July 2017];  Discussed  Re: neratinib- benefits [3% absolute benefit 94 versus 91% disease-free interval; again more so in hormone positive patients]/ SE-discussed. wil start prescription. Will not start until next visit. Discussed at length regarding side effects of diarrhea; prophylaxis with Lomotil.  #july 2017-  BRCA 1& 2- NEG.   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  # Repeat MUGA scan July 28 67% ejection fraction.  # follow up in 4 weeks/ Labs.  No orders of the defined types were placed in this encounter.  All questions were answered. The patient knows to call the clinic with any problems, questions or concerns.      GCammie Sickle MD 03/05/2016 12:57 PM

## 2016-03-05 NOTE — Patient Instructions (Signed)
Pt provided-with the following information on Netratinib.  Pt consent performed with patient.  Approximately 15 mins. Spent educating the patient and her family.     Nerlynx may cause serious side effects, including: Diarrhea. Diarrhea is a common side effect, but it can also be severe. You may lose too much body salts and fluids, and get dehydrated. Your healthcare provider should prescribe the medicine loperamide for you during your first two months (56 days) of treatment, and then as needed. To help prevent or reduce diarrhea:  You should start taking loperamide with your first dose.  Keep taking loperamide during the first two months (56 days) of treatment, and then as needed. Your healthcare provider will tell you exactly how much and how often to take loperamide.  Always take loperamide exactly as your healthcare provider tells you.  While taking loperamide, you and your healthcare provider should try to keep the number of bowel movements that you have at one or two bowel movements each day.  Tell your healthcare provider if you have more than two bowel movements in one day, or you have diarrhea that does not go away.  Call your healthcare provider right away, as instructed, if you have severe diarrhea or if you have diarrhea along with weakness, dizziness, or fever.  Your healthcare provider may also need to give you other medicines to manage diarrhea if loperamide does not work well enough.  After two months (56 days) of treatment, follow your healthcare provider's instructions about taking loperamide as needed to control diarrhea. Your healthcare provider may change your dose, temporarily stop or completely stop Nerlynx if needed to manage your diarrhea.  What is Nerlynx? Nerlynx is a prescription medicine used to treat adults who have early-stage breast cancer, which: is HER2-positive and  has previously been treated with the medicine trastuzumab. It is not known if Nerlynx is safe and  effective in children. Before taking Nerlynx Before you start treatment, tell your healthcare provider about all of your medical conditions, including if you: have liver problems. You may need a lower dose.  are pregnant or plan to become pregnant. Nerlynx can harm your unborn baby. If you are a female who can become pregnant:  Your healthcare provider should do a pregnancy test before you start treatment.  You should use effective birth control (contraception) during treatment and for at least one month after your last dose.  Talk with your healthcare provider about forms of birth control that you can use during this time.  Tell your healthcare provider right away if you become pregnant during treatment.  Males with female partners who can become pregnant should use effective birth control during treatment and for three months after the last dose. are breastfeeding or plan to breastfeed. It is not known if Nerlynx passes into your breast milk. Do not breastfeed during treatment and for at least one month after your last dose. Tell your healthcare provider about all the medicines you take, including prescription and over-the-counter medicines, vitamins, and herbal supplements. Especially tell your healthcare provider if you take medicines used to decrease stomach acid, called proton pump inhibitors or PPIs, and H-2 receptor antagonists. You should avoid taking these medicines during treatment. How should I take Nerlynx? Take Nerlynx exactly as your healthcare provider tells you to take it.  Your healthcare provider may change your dose if needed.  Take the tablets with food.  Take the tablets at about the same time each day.  If you take an antacid  medicine, take Nerlynx three hours after the antacid medicine.  Nerlynx is usually taken for one year.  If you miss a dose, skip that dose and take your next dose at your regular scheduled time.  If you take too much, or overdose, call your healthcare  provider right away or go to the nearest hospital emergency room.   Nerlynx side effects Nerlynx may cause serious side effects, including: See Important information. Liver problems. Changes in liver function tests are common with Nerlynx. Your healthcare provider should do blood tests before you begin treatment, monthly during the first three months, and then every three months as needed during treatment. Your healthcare provider will stop your treatment if your liver tests show severe problems. Call your healthcare provider right away if you get any of the following signs or symptoms of liver problems:  tiredness  nausea  vomiting  pain in the right upper stomach-area (abdomen)  fever  rash  itching  yellowing of your skin or whites of your eyes Common side effects include: diarrhea  nausea  stomach-area (abdomen) pain  tiredness  vomiting  rash  dry or inflamed mouth, or mouth sores  decreased appetite  muscle spasms  upset stomach  nail problems including color change  dry skin  swelling of your stomach-area  weight loss  urinary tract infection These are not all the possible side effects. Call your doctor for medical advice about side effects. You may report side effects to FDA at 1-800-FDA-1088. How should I store Nerlynx? Store the tablets at room temperature between 68 to 13F (20 to 25C). Keep all medicines out of the reach of children and pets. General information about the safe and effective use of Nerlynx. Medicines are sometimes prescribed for purposes other than those listed in a Patient Information leaflet. Do not use this medicine for a condition for which it was not prescribed. Do not give it to other people, even if they have the same symptoms that you have. It may harm them. You can ask your pharmacist or healthcare provider for information that is written for health professionals. What are the ingredients in Nerlynx? Active ingredient: neratinib Inactive  ingredients: Tablet Core: colloidal silicon dioxide, mannitol, microcrystalline cellulose, crospovidone, povidone, magnesium stearate & purified water. Coating: red film coat: polyvinyl alcohol, titanium dioxide, polyethylene glycol, talc, iron oxide red.

## 2016-03-05 NOTE — Assessment & Plan Note (Addendum)
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  Progesterone receptor negative.  HER-2/neu receptor positive. S/p- 138m/kg [finished July 2017];  Discussed  Re: neratinib- benefits [3% absolute benefit 94 versus 91% disease-free interval; again more so in hormone positive patients]/ SE-discussed. wil start prescription. Will not start until next visit. Discussed at length regarding side effects of diarrhea; prophylaxis with Lomotil.  #july 2017-  BRCA 1& 2- NEG.   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  # Repeat MUGA scan July 28 67% ejection fraction.  # follow up in 4 weeks/ Labs.

## 2016-03-06 ENCOUNTER — Ambulatory Visit: Payer: Medicare Other | Admitting: Physical Therapy

## 2016-03-06 ENCOUNTER — Encounter: Payer: Self-pay | Admitting: Physical Therapy

## 2016-03-06 DIAGNOSIS — R2681 Unsteadiness on feet: Secondary | ICD-10-CM | POA: Diagnosis not present

## 2016-03-06 DIAGNOSIS — M25551 Pain in right hip: Secondary | ICD-10-CM

## 2016-03-06 DIAGNOSIS — R262 Difficulty in walking, not elsewhere classified: Secondary | ICD-10-CM

## 2016-03-06 DIAGNOSIS — M6281 Muscle weakness (generalized): Secondary | ICD-10-CM

## 2016-03-06 NOTE — Patient Instructions (Addendum)
   TOTAL LEG: Engineer, materials with __5_ minutes bouts per ride, __2_  Sets per day, __5_ days per week. _Low__ Resistance  To get on the bike: Sit on the chair first, with legs on R side of the bike, pull L leg over and place feet on pedals. Reverse to dismount.   Copyright  VHI. All rights reserved.

## 2016-03-06 NOTE — Therapy (Signed)
[place] MAIN [facility] SERVICES [address] [place], [place], [address] Phone: [phone]   Fax:  [phone]  Physical Therapy Treatment  Patient Details  Name: DUA MEHLER MRN: [id number] Date of Birth: 16-Dec-1940 Referring Provider: Dr. Pryor Ochoa  Encounter Date: 03/06/2016      PT End of Session - 03/06/16 1044    Visit Number 5   Number of Visits 13   Date for PT Re-Evaluation 04/03/16   Authorization Type Gcode 5   Authorization Time Period 10   PT Start Time 0940   PT Stop Time 1020   PT Time Calculation (min) 40 min   Equipment Utilized During Treatment Gait belt   Activity Tolerance Patient tolerated treatment well;No increased pain      Past Medical History:  Diagnosis Date  . Arthritis   . 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 Breast cancer of upper-inner quadrant of left female breast ([place]) 12/15/14   Completed radiation end of December and finished chemotherapy 2 weeks ago, Left breast invasive mammary carcinoma, T1cN29mc (1.5 cm); Grade 3, [name], [name], [name], .  .Marland KitchenCataract    bilat   . 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 Hearing loss   . History of cancer chemotherapy   . History of radiation therapy   . Imbalance   . Memory impairment    seen by Dr SManuella Ghazi possible post crainiotomy from radiation  . Numbness and tingling    right hand   . Shoulder pain, right   . Wears glasses     Past Surgical History:  Procedure Laterality Date  . APPENDECTOMY  1950  . BRAIN SURGERY  1995   left frontal/temporal  . BREAST BIOPSY Left 1997  . BREAST BIOPSY Left 12/15/14   confirmed DCIS  . BREAST BIOPSY Left 01/08/2015   Procedure: BREAST BIOPSY WITH NEEDLE LOCALIZATION;  Surgeon: [name] MD;  Location: [facility] ORS;  Service: General;  Laterality: Left;  . BREAST LUMPECTOMY Left 01/08/2015   Procedure: LUMPECTOMY;  Surgeon: [name] MD;   Location: [facility] ORS;  Service: General;  Laterality: Left;  . COLONOSCOPY  2010   Dr. ETiffany Kocher . PORTACATH PLACEMENT Right 01/16/2015   Procedure: INSERTION PORT-A-CATH;  Surgeon: [name] MD;  Location: [facility] ORS;  Service: General;  Laterality: Right;  . SENTINEL NODE BIOPSY Left 01/16/2015   Procedure: SENTINEL NODE BIOPSY;  Surgeon: [name] MD;  Location: [facility] ORS;  Service: General;  Laterality: Left;  . TOTAL HIP ARTHROPLASTY Right 09/04/2015   Procedure: RIGHT TOTAL HIP ARTHROPLASTY ANTERIOR APPROACH;  Surgeon: MParalee Cancel MD;  Location: [facility] ORS;  Service: Orthopedics;  Laterality: Right;    There were no vitals filed for this visit.      Subjective Assessment - 03/06/16 0943    Subjective Patient had MD appointment Tuesday about hip "popping". Patient is a poor historian, but she states she had injections in both hips due to increase B hip pain. Patient reports no change in pain status. She also states that the MD told her she could take more of her pain meds (she couldn't remember which one). She also thinks that she may have had an xray taken. Patient's husband is not present to ask follow up questions.   Patient is accompained by: --   Pertinent History Factors Affecting History: PMH of CA, inner ear  problems, and recent R THA, supportive husband/family, 19 stairs to go from downstairs to upstairs bedroom    Limitations Walking;Standing   How long can you sit comfortably? NA   How long can you stand comfortably? 10 minutes    How long can you walk comfortably? 100 yds.   Patient Stated Goals Patient wishes to address balance and strength so she can walk more comfortably. Her husband wishes for her to gain confidence in her abilities to perform functional activities.    Currently in Pain? Yes   Pain Score 5    Pain Location Hip   Pain Orientation Right   Pain Descriptors / Indicators Sore   Pain Radiating Towards Mildly moves into the lateral leg   Pain Onset  More than a month ago   Multiple Pain Sites Yes   Pain Score 3   Pain Location Hip   Pain Orientation Left   Pain Radiating Towards --       Treatment:  Patient used no HHA unless she was catching herself when falling, minA-modA for stability depending on activity, B hip pain stayed constant with no increase or decrease in pain.  Tilt Board: Taps ant/post and lateral, 2x10 each direction, instructed to hold in balanced position, 2x15 seconds, minA for stability Difficulty increased to holding in balance position eyes closed, modA for stability  Static stance on airex, feet together eyes open/closed 2x15 seconds each, wide stance semi tandem eyes open/closed 2x15 each (modA for balance)  Static stance on airex pad with UE ball self tosses/catches against mirror, feet together 1x10 tosses, wide semi-tandem x10 tosses each LE leading; modA for stability Static stance on airex pad with SPT ball tosses in multi-direction, feet together x10 catches, wide semi-tandem x10 catches each LE leading, decreased to CGA/minA for stability  Instructed to ambulate ~100 feet, SBQC, CGA, min VCs to maintain upright posture with no Trendelenburg, continues to have R hip "popping" no increase in pain. Seated rest break after ambulation.  Standing hip ABD, 3x10, 2 HHA, min VCs to maintain upright posture   Instructed on how to mount/dismount stationary bicycle due to patient stating she was getting one for home today. Min VCs to sit first on R side of bike, move LLE over middle portion, and then put feet on bicycle. Instructed it may be easier if her seat was back initially and moved forward after getting on. She requires close supervision but shows no instability. Min VCs about distance away and starting with lower performance time before increasing time in order to slowly progress. She reports no increase in B hip pain after performing for 2 min.  Patient given handout (see patient instructions) for  stationary bicycle.                           PT Education - 03/06/16 1043    Education provided Yes   Education Details recumbent bike mount/dismount, slow progression; handout given   Person(s) Educated Patient   Methods Explanation;Demonstration;Handout;Verbal cues   Comprehension Verbalized understanding;Returned demonstration;Verbal cues required             PT Long Term Goals - 02/21/16 1037      PT LONG TERM GOAL #1   Title Patient will improve functional gait assessment to >22/30 in order to decrease fear of falling and improve ease of community ambulation.   Time 6   Period Weeks   Status New     PT LONG  TERM GOAL #2   Title Patient will be independent in HEP in order to increase patient's ability to maintain gains achieved in therapy and assist with return to PLOF.    Time 6   Period Weeks   Status New     PT LONG TERM GOAL #3   Title Patient will increase overall strength to 4+/5 in BLE to increase ROM/strength throughout functional activities performed in her ADLs.    Time 6   Period Weeks   Status New     PT LONG TERM GOAL #4   Title Patient will increase her 10 m walk test time to >1.0 m/s in order to be a community ambulator at decreased risk for falls.    Time 6   Period Weeks   Status New     PT LONG TERM GOAL #5   Title Patient will report a worst pain in right hip of 3/10 in last few days to demonstrate improved tolerance with ADLs.   Time 6   Period Weeks   Status New               Plan - 03/06/16 1312    Clinical Impression Statement Patient had MD visit about continued B hip pain and R hip "popping". Per patient (who was a poor historian), the MD is not worried about the "popping", give her injections in both hips, and stated she could increase her pain meds prn. Patient is able to participate in PT with no increase in pain although pain does not improve either. Patient has difficulty performing balance activities  especially with eyes closed requiring min-modA for stability. Instructed patient on safe way to mount/dismount stationary bike with min VCs. Patient would continue to benefit from skilled PT in order to address hip pain,    Rehab Potential Good   Clinical Impairments Affecting Rehab Potential Postive Factors: Good family support, motivated; Negative Factors: PMH including CA, OA, recent R THA; Clinical Impression: Evolving- falls risk, involved PMH, inner ear problems    PT Frequency 2x / week   PT Duration 6 weeks   PT Treatment/Interventions ADLs/Self Care Home Management;Biofeedback;Cryotherapy;Moist Heat;DME Instruction;Gait training;Stair training;Functional mobility training;Therapeutic activities;Therapeutic exercise;Balance training;Neuromuscular re-education;Patient/family education;Manual techniques;Energy conservation;Vestibular   PT Next Visit Plan check with husband about MD appointment, begin re-initiating HEP, balance, LE strengthening especially hip    PT Home Exercise Plan HEP on hold until further instruction   Consulted and Agree with Plan of Care Patient;Family member/caregiver      Patient will benefit from skilled therapeutic intervention in order to improve the following deficits and impairments:  Decreased activity tolerance, Decreased balance, Decreased coordination, Decreased endurance, Decreased mobility, Decreased safety awareness, Decreased strength, Difficulty walking, Hypomobility, Impaired sensation, Impaired UE functional use, Improper body mechanics, Postural dysfunction, Impaired flexibility, Pain  Visit Diagnosis: Unsteadiness on feet  Pain in right hip  Difficulty in walking, not elsewhere classified  Muscle weakness (generalized)     Problem List Patient Active Problem List   Diagnosis Date Noted  . Cancer of overlapping sites of left female breast ([place]) 01/23/2016  . Left breast mass 12/31/2015  . S/P right THA, AA 09/04/2015  . History of  artificial joint 09/04/2015  . 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 Malignant neoplasm of female breast ([place]) 07/18/2015  . Neoplasm of meninges ([place]) 07/18/2015  . Breast cancer metastasized to axillary lymph node ([place]) 01/24/2015  . DDD (degenerative disc disease), lumbar 01/02/2015  . HLD (hyperlipidemia) 01/02/2015  .  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 Arthritis, degenerative 01/02/2015  . Psoriasis 01/02/2015  . Rotator cuff arthropathy 09/15/2012   Tilman Neat, SPT This entire session was performed under direct supervision and direction of a licensed therapist/therapist assistant . I have personally read, edited and approve of the note as written.  Trotter,Margaret PT, DPT 03/06/2016, 5:32 PM  [place] [facility] SERVICES [address] [place], [place], [address] Phone: [phone]   Fax:  [phone]  Name: BLU MCGLAUN MRN: [id number] Date of Birth: [birth year]

## 2016-03-10 ENCOUNTER — Encounter: Payer: Self-pay | Admitting: Physical Therapy

## 2016-03-10 ENCOUNTER — Ambulatory Visit: Payer: Medicare Other | Admitting: Physical Therapy

## 2016-03-10 DIAGNOSIS — R2681 Unsteadiness on feet: Secondary | ICD-10-CM

## 2016-03-10 DIAGNOSIS — M6281 Muscle weakness (generalized): Secondary | ICD-10-CM

## 2016-03-10 DIAGNOSIS — M25551 Pain in right hip: Secondary | ICD-10-CM

## 2016-03-10 DIAGNOSIS — R262 Difficulty in walking, not elsewhere classified: Secondary | ICD-10-CM

## 2016-03-10 NOTE — Therapy (Signed)
[place] MAIN [facility] SERVICES [address] [place], [place], [address] Phone: [phone]   Fax:  [phone]  Physical Therapy Treatment  Patient Details  Name: [name] MRN: [id number] Date of Birth: 02-Jun-1941 Referring Provider: Dr. Pryor Ochoa  Encounter Date: 03/10/2016      PT End of Session - 03/10/16 1603    Visit Number 6   Number of Visits 13   Date for PT Re-Evaluation 04/03/16   Authorization Type Gcode 6   Authorization Time Period 10   PT Start Time 1305   PT Stop Time 1346   PT Time Calculation (min) 41 min   Equipment Utilized During Treatment Gait belt   Activity Tolerance Patient tolerated treatment well;No increased pain      Past Medical History:  Diagnosis Date  . Arthritis   . 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 Breast cancer of upper-inner quadrant of left female breast ([place]) 12/15/14   Completed radiation end of December and finished chemotherapy 2 weeks ago, Left breast invasive mammary carcinoma, T1cN46mc (1.5 cm); Grade 3, [name], [name], [name], .  .Marland KitchenCataract    bilat   . 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 Hearing loss   . History of cancer chemotherapy   . History of radiation therapy   . Imbalance   . Memory impairment    seen by Dr SManuella Ghazi possible post crainiotomy from radiation  . Numbness and tingling    right hand   . Shoulder pain, right   . Wears glasses     Past Surgical History:  Procedure Laterality Date  . APPENDECTOMY  1950  . BRAIN SURGERY  1995   left frontal/temporal  . BREAST BIOPSY Left 1997  . BREAST BIOPSY Left 12/15/14   confirmed DCIS  . BREAST BIOPSY Left 01/08/2015   Procedure: BREAST BIOPSY WITH NEEDLE LOCALIZATION;  Surgeon: [name] MD;  Location: [facility] ORS;  Service: General;  Laterality: Left;  . BREAST LUMPECTOMY Left 01/08/2015   Procedure: LUMPECTOMY;  Surgeon: [name] MD;   Location: [facility] OR[facility];  Service: General;  Laterality: Left;  . COLONOSCOPY  2010   Dr. ETiffany Kocher . PORTACATH PLACEMENT Right 01/16/2015   Procedure: INSERTION PORT-A-CATH;  Surgeon: [name] MD;  Location: [facility] ORS;  Service: General;  Laterality: Right;  . SENTINEL NODE BIOPSY Left 01/16/2015   Procedure: SENTINEL NODE BIOPSY;  Surgeon: [name] MD;  Location: [facility] OR[facility];  Service: General;  Laterality: Left;  . TOTAL HIP ARTHROPLASTY Right 09/04/2015   Procedure: RIGHT TOTAL HIP ARTHROPLASTY ANTERIOR APPROACH;  Surgeon: MParalee Cancel MD;  Location: [facility];  Service: Orthopedics;  Laterality: Right;    There were no vitals filed for this visit.      Subjective Assessment - 03/10/16 1310    Subjective Patient reports doing the bike at home has been good. She states she is doing well with no falls. Per husband, MD says prosthetic is aligned appropriately and that it may be trochanter bursitis or "snapping" hip. No changes in activity level or precautions.   Pertinent History Factors Affecting History: PMH of CA, inner ear problems, and recent R THA, supportive husband/family, 19 stairs to go from downstairs to upstairs bedroom    Limitations Walking;Standing   How long can you sit comfortably? NA   How long can you stand comfortably? 10 minutes  How long can you walk comfortably? 100 yds.   Patient Stated Goals Patient wishes to address balance and strength so she can walk more comfortably. Her husband wishes for her to gain confidence in her abilities to perform functional activities.    Currently in Pain? Yes   Pain Score 5    Pain Location Hip   Pain Orientation Right   Pain Onset More than a month ago   Multiple Pain Sites Yes   Pain Location Hip   Pain Orientation Left      Treatment:  TherEx: Lateral walking no resistance, x2 in // bars, min VCs to increase step length, CGA for safety, no HHA Lateral walking with red tband resistance, x2 each direction in //  bars, again min VCs to increase step length, no HAA, CGA for safety, no increase in pain Standing hip ABD, 2 HHA, red tband resistance, x10, mild increased pain discontinued Wall squats on pball, 2x10, min VCs for feet placement to decrease stress on B knees  Neuromuscular Re-Ed: Static tandem standing on 2 dynadisc each LE leading, x30 seconds each, min-modA for stability, min VCs to maintain equal weight bearing   Increased to head turns once stable, x5 each direction.  Static standing on half bolster (flat side up), 2x45 seconds, instructed to maintain static with cognitive task of listing boys names, increased difficulty, min A for stability.  Airex pad activities: Feet apart and wide semi-tandem self ball tosses, x10 tosses each, CGA for safety Heel to toes, x10, minA for stability, loses balance posteriorly Feet together, BUE reaches in multiple directions, 5x3, min VCs for where to reach and to perform safe reaching, R shoulder has cavitations so LUE used more frequently.   Agility ladder: Forwards/backwards side stepping, x2, minA for stability, min VCs for sequencing. Lateral and forwards walking, x4, added cognitive task of listing girls names, CGA for safety  Initiated into HEP: Mini squats, x10, min VCs to improve mild B hip flexion, perform while holding onto something  Static wide semi tandem stance on firm surface, x20 seconds each foot leading, min VCs to perform in a safe corner.                            PT Education - 03/10/16 1602    Education provided Yes   Education Details HEP progressed, handout given   Person(s) Educated Patient   Methods Explanation;Handout;Demonstration;Verbal cues   Comprehension Verbalized understanding;Returned demonstration;Verbal cues required             PT Long Term Goals - 02/21/16 1037      PT LONG TERM GOAL #1   Title Patient will improve functional gait assessment to >22/30 in order to decrease fear  of falling and improve ease of community ambulation.   Time 6   Period Weeks   Status New     PT LONG TERM GOAL #2   Title Patient will be independent in HEP in order to increase patient's ability to maintain gains achieved in therapy and assist with return to PLOF.    Time 6   Period Weeks   Status New     PT LONG TERM GOAL #3   Title Patient will increase overall strength to 4+/5 in BLE to increase ROM/strength throughout functional activities performed in her ADLs.    Time 6   Period Weeks   Status New     PT LONG TERM GOAL #4  Title Patient will increase her 10 m walk test time to >1.0 m/s in order to be a community ambulator at decreased risk for falls.    Time 6   Period Weeks   Status New     PT LONG TERM GOAL #5   Title Patient will report a worst pain in right hip of 3/10 in last few days to demonstrate improved tolerance with ADLs.   Time 6   Period Weeks   Status New               Plan - 03/10/16 1603    Clinical Impression Statement Patient continues to have constant pain in B hips and R hip "popping". Per patient's husband, her MD thinks she has bursitis or something similar to a snapping hip. He states that the patient has no precautions or changes to treatment due to this. Patient is able to participate in PT with no change in pain except for mild increase in resisted hip ABD on the R, discontinued and 5/10 pain remains. She does become nervous during more challenging balance activities especially when cognitive task is involved. Patient would continue to benefit from skilled PT in order to address hip pain, balance, and safety during mobility.    Rehab Potential Good   Clinical Impairments Affecting Rehab Potential Postive Factors: Good family support, motivated; Negative Factors: PMH including CA, OA, recent R THA; Clinical Impression: Evolving- falls risk, involved PMH, inner ear problems    PT Frequency 2x / week   PT Duration 6 weeks   PT  Treatment/Interventions ADLs/Self Care Home Management;Biofeedback;Cryotherapy;Moist Heat;DME Instruction;Gait training;Stair training;Functional mobility training;Therapeutic activities;Therapeutic exercise;Balance training;Neuromuscular re-education;Patient/family education;Manual techniques;Energy conservation;Vestibular   PT Next Visit Plan begin re-initiating HEP, balance, LE strengthening especially hip    PT Home Exercise Plan HEP progressed   Consulted and Agree with Plan of Care Patient;Family member/caregiver      Patient will benefit from skilled therapeutic intervention in order to improve the following deficits and impairments:  Decreased activity tolerance, Decreased balance, Decreased coordination, Decreased endurance, Decreased mobility, Decreased safety awareness, Decreased strength, Difficulty walking, Hypomobility, Impaired sensation, Impaired UE functional use, Improper body mechanics, Postural dysfunction, Impaired flexibility, Pain  Visit Diagnosis: Unsteadiness on feet  Pain in right hip  Difficulty in walking, not elsewhere classified  Muscle weakness (generalized)     Problem List Patient Active Problem List   Diagnosis Date Noted  . Cancer of overlapping sites of left female breast ([place]) 01/23/2016  . Left breast mass 12/31/2015  . S/P right THA, AA 09/04/2015  . History of artificial joint 09/04/2015  . 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 Malignant neoplasm of female breast ([place]) 07/18/2015  . Neoplasm of meninges ([place]) 07/18/2015  . Breast cancer metastasized to axillary lymph node ([place]) 01/24/2015  . DDD (degenerative disc disease), lumbar 01/02/2015  . HLD (hyperlipidemia) 01/02/2015  . Benign neoplasm of meninges ([place]) 01/02/2015  . Arthritis, degenerative 01/02/2015  . Psoriasis 01/02/2015  . Rotator cuff arthropathy 09/15/2012   Tilman Neat, SPT This entire session was performed under direct supervision and direction  of a licensed therapist/therapist assistant . I have personally read, edited and approve of the note as written.  Trotter,Margaret PT, DPT 03/10/2016, 4:34 PM  [place] MAIN [facility] SERVICES [address] [place], [place], [address] Phone: [phone]   Fax:  [phone]  Name: [name] MRN: [id number] Date of Birth: 07-07-1941

## 2016-03-10 NOTE — Patient Instructions (Addendum)
     Mini Squat: Double Leg    Copyright  VHI. All rights reserved.  Tandem Stance    Right foot in front of left, keep feet 2" apart with one foot in front of the other. Balance in this position _15__ seconds. Do with left foot in front of right.  Copyright  VHI. All rights reserved.  Mini Squat: Double Leg    With feet shoulder width apart, reach forward for balance and do a mini squat. Keep knees in line with second toe. Knees do not go past toes. Repeat _8__ times per set. Rest _60__ seconds after set. Do _2__ sets per session.  http://plyo.exer.us/70   Copyright  VHI. All rights reserved.

## 2016-03-11 ENCOUNTER — Telehealth: Payer: Self-pay

## 2016-03-11 NOTE — Telephone Encounter (Signed)
Spoke with specialty pharmacy today they are preparing the neratinib for pt.

## 2016-03-12 ENCOUNTER — Ambulatory Visit: Payer: Medicare Other | Admitting: Physical Therapy

## 2016-03-12 ENCOUNTER — Encounter: Payer: Self-pay | Admitting: Physical Therapy

## 2016-03-12 DIAGNOSIS — M25551 Pain in right hip: Secondary | ICD-10-CM

## 2016-03-12 DIAGNOSIS — R2681 Unsteadiness on feet: Secondary | ICD-10-CM

## 2016-03-12 DIAGNOSIS — M6281 Muscle weakness (generalized): Secondary | ICD-10-CM

## 2016-03-12 DIAGNOSIS — R262 Difficulty in walking, not elsewhere classified: Secondary | ICD-10-CM

## 2016-03-12 NOTE — Therapy (Signed)
[place] MAIN [facility] SERVICES [address] [place], [place], [address] Phone: [phone]   Fax:  [phone]  Physical Therapy Treatment  Patient Details  Name: [name] MRN: [id number] Date of Birth: [birth year] Referring Provider: Dr. Pryor Ochoa  Encounter Date: 03/12/2016      PT End of Session - 03/12/16 1501    Visit Number 7   Number of Visits 13   Date for PT Re-Evaluation 04/03/16   Authorization Type Gcode 7   Authorization Time Period 10   PT Start Time 1302   PT Stop Time 1345   PT Time Calculation (min) 43 min   Equipment Utilized During Treatment Gait belt   Activity Tolerance Patient tolerated treatment well;No increased pain      Past Medical History:  Diagnosis Date  . Arthritis   . 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 Breast cancer of upper-inner quadrant of left female breast ([place]) 12/15/14   Completed radiation end of December and finished chemotherapy 2 weeks ago, Left breast invasive mammary carcinoma, T1cN10mc (1.5 cm); Grade 3, [name], [name], [name], .  .Marland KitchenCataract    bilat   . 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 Hearing loss   . History of cancer chemotherapy   . History of radiation therapy   . Imbalance   . Memory impairment    seen by Dr SManuella Ghazi possible post crainiotomy from radiation  . Numbness and tingling    right hand   . Shoulder pain, right   . Wears glasses     Past Surgical History:  Procedure Laterality Date  . APPENDECTOMY  1950  . BRAIN SURGERY  1995   left frontal/temporal  . BREAST BIOPSY Left 1997  . BREAST BIOPSY Left 12/15/14   confirmed DCIS  . BREAST BIOPSY Left 01/08/2015   Procedure: BREAST BIOPSY WITH NEEDLE LOCALIZATION;  Surgeon: [name] MD;  Location: [facility];  Service: General;  Laterality: Left;  . BREAST LUMPECTOMY Left 01/08/2015   Procedure: LUMPECTOMY;  Surgeon: [name] MD;   Location: [facility];  Service: General;  Laterality: Left;  . COLONOSCOPY  2010   Dr. ETiffany Kocher . PORTACATH PLACEMENT Right 01/16/2015   Procedure: INSERTION PORT-A-CATH;  Surgeon: [name] MD;  Location: [facility];  Service: General;  Laterality: Right;  . SENTINEL NODE BIOPSY Left 01/16/2015   Procedure: SENTINEL NODE BIOPSY;  Surgeon: [name] MD;  Location: [facility];  Service: General;  Laterality: Left;  . TOTAL HIP ARTHROPLASTY Right 09/04/2015   Procedure: RIGHT TOTAL HIP ARTHROPLASTY ANTERIOR APPROACH;  Surgeon: MParalee Cancel MD;  Location: [facility];  Service: Orthopedics;  Laterality: Right;    There were no vitals filed for this visit.      Subjective Assessment - 03/12/16 1309    Subjective Patient reports her pain has increased on her R side, but she states she is point tender on the L. Patient reports she is still performing the bike at home and reports no pain with it but reports that she feels like her legs feel weaker. She stated she was doing the JEchoStarexercises and started them a couple weeks ago. She reports she was able to do HEP 1-2 times with no increase in pain.    Pertinent History Factors Affecting History: PMH of CA, inner ear problems, and recent R THA, supportive  husband/family, 19 stairs to go from downstairs to upstairs bedroom    Limitations Walking;Standing   How long can you sit comfortably? NA   How long can you stand comfortably? 10 minutes    How long can you walk comfortably? 100 yds.   Patient Stated Goals Patient wishes to address balance and strength so she can walk more comfortably. Her husband wishes for her to gain confidence in her abilities to perform functional activities.    Currently in Pain? Yes   Pain Score 5    Pain Location Hip   Pain Orientation Right   Pain Descriptors / Indicators Sharp;Constant   Pain Onset More than a month ago   Multiple Pain Sites Yes   Pain Score 0   Pain Location Hip   Pain Orientation Left      Treatment: Patient is tender to palpation at and just distal to bilateral greater trochanters. She states she is unable to lay on her side due to this pain.  TherEx: Seated hip flexion, 3x10, no pain during activity.    Pball seated exercises (mirror in front of patient for feedback): Min-modA during activity to maintain pball steady and to keep patient stable.  Seated posture, ~4-5 minutes with multiple VCs to lean forward slightly due to patient continually leaning posteriorly. Seated hip flexion, 2x10, min VCs to lean forward and maintain upright position (decrease lateral flexion to the right) Seated LAQ, 2x10, min VCs to increase forward lean positioning and to maintain upright posture. Lateral weight shifts, 2x10, min VCs to maintain upright posture with and forward lean.   Re-educated on HEP: Min VCs to perform in corner against wall and chair in front or with something to hold onto in front of her. Wide tandem stance balance, x15 seconds, each LE leading, required explanation of how this activity challenges your balance. Mini squats, 3x5, denies pain during but reports continued constant pain after that may have increased slightly.    Positioned in supine with ice pack on RLE for ~5 minutes; patient reports pain being mildly lower than when she began therapy.                               PT Education - 03/12/16 1500    Education provided Yes   Education Details Re-educated on HEP, instructed if mini squats increased pain to discontinue, instructed to stop performing Derek Jack exercise tapes at home.    Person(s) Educated Patient   Methods Explanation;Handout;Demonstration   Comprehension Verbalized understanding;Returned demonstration             PT Long Term Goals - 02/21/16 1037      PT LONG TERM GOAL #1   Title Patient will improve functional gait assessment to >22/30 in order to decrease fear of falling and improve ease of community  ambulation.   Time 6   Period Weeks   Status New     PT LONG TERM GOAL #2   Title Patient will be independent in HEP in order to increase patient's ability to maintain gains achieved in therapy and assist with return to PLOF.    Time 6   Period Weeks   Status New     PT LONG TERM GOAL #3   Title Patient will increase overall strength to 4+/5 in BLE to increase ROM/strength throughout functional activities performed in her ADLs.    Time 6   Period Weeks   Status New  PT LONG TERM GOAL #4   Title Patient will increase her 10 m walk test time to >1.0 m/s in order to be a community ambulator at decreased risk for falls.    Time 6   Period Weeks   Status New     PT LONG TERM GOAL #5   Title Patient will report a worst pain in right hip of 3/10 in last few days to demonstrate improved tolerance with ADLs.   Time 6   Period Weeks   Status New               Plan - 03/12/16 1501    Clinical Impression Statement Patient continues to complain of constant pain in R hip with mild increase and point tenderness only on L hip. Seated exercises performed today due to patient's increased pain. Patient reported performing Derek Jack exercise video at home. She was instructed to stop performing the exercises in order to see what is truly causing the pain. Patient reports mild decrease in pain with seated exercises with resumed level of pain with re-educated in mini squats although patient states she has no pain when performing. Patient instructed to discontinued if causes pain at home and to continue icing for pain relief. Patient would continue to benefit from skilled PT in order to address B hip pain, safety with mobility, and balance.   Rehab Potential Good   Clinical Impairments Affecting Rehab Potential Postive Factors: Good family support, motivated; Negative Factors: PMH including CA, OA, recent R THA; Clinical Impression: Evolving- falls risk, involved PMH, inner ear problems    PT  Frequency 2x / week   PT Duration 6 weeks   PT Treatment/Interventions ADLs/Self Care Home Management;Biofeedback;Cryotherapy;Moist Heat;DME Instruction;Gait training;Stair training;Functional mobility training;Therapeutic activities;Therapeutic exercise;Balance training;Neuromuscular re-education;Patient/family education;Manual techniques;Energy conservation;Vestibular   PT Next Visit Plan begin re-initiating HEP, balance, LE strengthening especially hip    PT Home Exercise Plan continue as given, see patient instructions   Consulted and Agree with Plan of Care Patient;Family member/caregiver      Patient will benefit from skilled therapeutic intervention in order to improve the following deficits and impairments:  Decreased activity tolerance, Decreased balance, Decreased coordination, Decreased endurance, Decreased mobility, Decreased safety awareness, Decreased strength, Difficulty walking, Hypomobility, Impaired sensation, Impaired UE functional use, Improper body mechanics, Postural dysfunction, Impaired flexibility, Pain  Visit Diagnosis: Unsteadiness on feet  Pain in right hip  Difficulty in walking, not elsewhere classified  Muscle weakness (generalized)     Problem List Patient Active Problem List   Diagnosis Date Noted  . Cancer of overlapping sites of left female breast ([place]) 01/23/2016  . Left breast mass 12/31/2015  . S/P right THA, AA 09/04/2015  . History of artificial joint 09/04/2015  . 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 Malignant neoplasm of female breast ([place]) 07/18/2015  . Neoplasm of meninges ([place]) 07/18/2015  . Breast cancer metastasized to axillary lymph node ([place]) 01/24/2015  . DDD (degenerative disc disease), lumbar 01/02/2015  . HLD (hyperlipidemia) 01/02/2015  . Benign neoplasm of meninges ([place]) 01/02/2015  . Arthritis, degenerative 01/02/2015  . Psoriasis 01/02/2015  . Rotator cuff arthropathy 09/15/2012   Tilman Neat,  SPT This entire session was performed under direct supervision and direction of a licensed therapist/therapist assistant . I have personally read, edited and approve of the note as written.  Trotter,Margaret PT, DPT 03/12/2016, 3:38 PM  [place] MAIN [facility] SERVICES [address] [place], [place], [address]  Phone: [phone]   Fax:  [phone]  Name: [name] MRN: [id number] Date of Birth: Oct 05, 1940

## 2016-03-17 ENCOUNTER — Ambulatory Visit: Payer: Medicare Other | Admitting: General Surgery

## 2016-03-17 ENCOUNTER — Ambulatory Visit: Payer: Medicare Other | Admitting: Physical Therapy

## 2016-03-18 ENCOUNTER — Encounter: Payer: Self-pay | Admitting: *Deleted

## 2016-03-19 ENCOUNTER — Ambulatory Visit: Payer: Medicare Other | Admitting: Physical Therapy

## 2016-03-19 ENCOUNTER — Encounter: Payer: Self-pay | Admitting: Physical Therapy

## 2016-03-19 DIAGNOSIS — R2681 Unsteadiness on feet: Secondary | ICD-10-CM

## 2016-03-19 DIAGNOSIS — R262 Difficulty in walking, not elsewhere classified: Secondary | ICD-10-CM

## 2016-03-19 DIAGNOSIS — M6281 Muscle weakness (generalized): Secondary | ICD-10-CM

## 2016-03-19 DIAGNOSIS — M25551 Pain in right hip: Secondary | ICD-10-CM

## 2016-03-19 NOTE — Patient Instructions (Addendum)
Outer Hip Stretch: Reclined IT Band Stretch (Strap)    Bedsheet around opposite foot, pull across only as far as possible with shoulders on mat. Hold for __15-30__ seconds. Repeat _2-3__ times each leg.  DO NOT STRETCH INTO PAIN. IF THESE STRETCHES START CAUSING PAIN THEN STOP THESE STRETCHES.   Copyright  VHI. All rights reserved.

## 2016-03-19 NOTE — Therapy (Signed)
[place] MAIN [facility] SERVICES [address] [place], [place], [address] Phone: [phone]   Fax:  [phone]  Physical Therapy Treatment  Patient Details  Name: [name] MRN: [id number] Date of Birth: 1941/04/09 Referring Provider: Dr. Pryor Ochoa  Encounter Date: 03/19/2016      PT End of Session - 03/19/16 1452    Visit Number 8   Number of Visits 13   Date for PT Re-Evaluation 04/03/16   Authorization Type Gcode 8   Authorization Time Period 10   PT Start Time 1308   PT Stop Time 1347   PT Time Calculation (min) 39 min   Equipment Utilized During Treatment Gait belt   Activity Tolerance Patient tolerated treatment well      Past Medical History:  Diagnosis Date  . Arthritis   . 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 Breast cancer of upper-inner quadrant of left female breast ([place]) 12/15/14   Completed radiation end of December and finished chemotherapy 2 weeks ago, Left breast invasive mammary carcinoma, T1cN68mc (1.5 cm); Grade 3, [name], [name], [name], .  .Marland KitchenCataract    bilat   . 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 Hearing loss   . History of cancer chemotherapy   . History of radiation therapy   . Imbalance   . Memory impairment    seen by Dr SManuella Ghazi possible post crainiotomy from radiation  . Numbness and tingling    right hand   . Shoulder pain, right   . Wears glasses     Past Surgical History:  Procedure Laterality Date  . APPENDECTOMY  1950  . BRAIN SURGERY  1995   left frontal/temporal  . BREAST BIOPSY Left 1997  . BREAST BIOPSY Left 12/15/14   confirmed DCIS  . BREAST BIOPSY Left 01/08/2015   Procedure: BREAST BIOPSY WITH NEEDLE LOCALIZATION;  Surgeon: [name] MD;  Location: [facility] ORS;  Service: General;  Laterality: Left;  . BREAST LUMPECTOMY Left 01/08/2015   Procedure: LUMPECTOMY;  Surgeon: [name] MD;  Location: [facility] ORS;   Service: General;  Laterality: Left;  . COLONOSCOPY  2010   Dr. ETiffany Kocher . PORTACATH PLACEMENT Right 01/16/2015   Procedure: INSERTION PORT-A-CATH;  Surgeon: [name] MD;  Location: [facility] ORS;  Service: General;  Laterality: Right;  . SENTINEL NODE BIOPSY Left 01/16/2015   Procedure: SENTINEL NODE BIOPSY;  Surgeon: [name] MD;  Location: [facility] ORS;  Service: General;  Laterality: Left;  . TOTAL HIP ARTHROPLASTY Right 09/04/2015   Procedure: RIGHT TOTAL HIP ARTHROPLASTY ANTERIOR APPROACH;  Surgeon: MParalee Cancel MD;  Location: [facility];  Service: Orthopedics;  Laterality: Right;    There were no vitals filed for this visit.      Subjective Assessment - 03/19/16 1310    Subjective Patient states she is doing okay but she has been having diarrhea for a 2 days. She states she is not doing the [employer] haven't been able to do HEP much.    Pertinent History Factors Affecting History: PMH of CA, inner ear problems, and recent R THA, supportive husband/family, 19 stairs to go from downstairs to upstairs bedroom    Limitations Walking;Standing   How long can you sit comfortably? NA   How long can you stand comfortably? 10 minutes    How long can you walk comfortably? 100 yds.  Patient Stated Goals Patient wishes to address balance and strength so she can walk more comfortably. Her husband wishes for her to gain confidence in her abilities to perform functional activities.    Currently in Pain? Yes   Pain Score 5    Pain Location Hip   Pain Orientation Right   Pain Onset More than a month ago   Pain Score 1   Pain Location Hip   Pain Orientation Left      Treatment:  Supine soft tissue mobilization of IT band bilaterally, for 30 second bouts in between stretching, ~5 minutes, patient has point tenderness that improves with tissue mobilization  TherEx: Supine IT band stretching, 3x4 each LE, for ~10 minutes Instructed patient and husband on active assisted IT  band stretching with bed sheet, 2x15 seconds, ~5 minutes, instructed not to stretch into pain but to feel a nice stretch.  Neuromuscular Re-ed: Seated on therapy ball, patient requires CGA-minA for stability and minA to maintain the ball in a stable position. Patient demonstrates poor core control and body awareness in space with multiple VCs to improve posture by leaning slightly forward and to the L in order to counteract her leaning the opposite way. Posture 3x30 seconds in between each session B hip flexion, 3x10, min VCs to maintain upright posture while performing B LAQ, 2x10, min VCs to perform straight kicks outwards and not into hip flexion. Patient has improved core control during this activity.                             PT Education - 03/19/16 1451    Education provided Yes   Education Details Instructed to perform HEP and bike at home consistently to see good improvements, HEP progressed    Person(s) Educated Patient   Methods Explanation;Handout;Verbal cues   Comprehension Verbalized understanding;Verbal cues required             PT Long Term Goals - 02/21/16 1037      PT LONG TERM GOAL #1   Title Patient will improve functional gait assessment to >22/30 in order to decrease fear of falling and improve ease of community ambulation.   Time 6   Period Weeks   Status New     PT LONG TERM GOAL #2   Title Patient will be independent in HEP in order to increase patient's ability to maintain gains achieved in therapy and assist with return to PLOF.    Time 6   Period Weeks   Status New     PT LONG TERM GOAL #3   Title Patient will increase overall strength to 4+/5 in BLE to increase ROM/strength throughout functional activities performed in her ADLs.    Time 6   Period Weeks   Status New     PT LONG TERM GOAL #4   Title Patient will increase her 10 m walk test time to >1.0 m/s in order to be a community ambulator at decreased risk for falls.     Time 6   Period Weeks   Status New     PT LONG TERM GOAL #5   Title Patient will report a worst pain in right hip of 3/10 in last few days to demonstrate improved tolerance with ADLs.   Time 6   Period Weeks   Status New               Plan - 03/19/16 1453  Clinical Impression Statement Patient continues to complain of constant pain in R/L hip that is unremitting. She states that it does not feel like it is worsening but that it is maintaining. Patient has tightness and tenderness in L/R IT bands. With active assited stretching, patient reports no pain and that she feels a stretch. Husband and patient instructed on how to perform at home. With core work again today, patient shows poor trunk control with LE movement with a posterior and lateral trunk lean. It was explained to husband and patient that they needed to continue to commit to the HEP and exercise at home and that balance will be continued to be addressed here. Patient would continue to benefit from skilled PT in order to address B hip pain, safety with mobility, and balance.    Rehab Potential Good   Clinical Impairments Affecting Rehab Potential Postive Factors: Good family support, motivated; Negative Factors: PMH including CA, OA, recent R THA; Clinical Impression: Evolving- falls risk, involved PMH, inner ear problems    PT Frequency 2x / week   PT Duration 6 weeks   PT Treatment/Interventions ADLs/Self Care Home Management;Biofeedback;Cryotherapy;Moist Heat;DME Instruction;Gait training;Stair training;Functional mobility training;Therapeutic activities;Therapeutic exercise;Balance training;Neuromuscular re-education;Patient/family education;Manual techniques;Energy conservation;Vestibular   PT Next Visit Plan build HEP with more balance, dynamic balance, core work   PT Gilbert progressed, see patient instructions   Consulted and Agree with Plan of Care Patient;Family member/caregiver      Patient  will benefit from skilled therapeutic intervention in order to improve the following deficits and impairments:  Decreased activity tolerance, Decreased balance, Decreased coordination, Decreased endurance, Decreased mobility, Decreased safety awareness, Decreased strength, Difficulty walking, Hypomobility, Impaired sensation, Impaired UE functional use, Improper body mechanics, Postural dysfunction, Impaired flexibility, Pain  Visit Diagnosis: Unsteadiness on feet  Pain in right hip  Difficulty in walking, not elsewhere classified  Muscle weakness (generalized)     Problem List Patient Active Problem List   Diagnosis Date Noted  . Cancer of overlapping sites of left female breast ([place]) 01/23/2016  . Left breast mass 12/31/2015  . S/P right THA, AA 09/04/2015  . History of artificial joint 09/04/2015  . 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 Malignant neoplasm of female breast ([place]) 07/18/2015  . Neoplasm of meninges ([place]) 07/18/2015  . Breast cancer metastasized to axillary lymph node ([place]) 01/24/2015  . DDD (degenerative disc disease), lumbar 01/02/2015  . HLD (hyperlipidemia) 01/02/2015  . Benign neoplasm of meninges ([place]) 01/02/2015  . Arthritis, degenerative 01/02/2015  . Psoriasis 01/02/2015  . Rotator cuff arthropathy 09/15/2012   Tilman Neat, SPT This entire session was performed under direct supervision and direction of a licensed therapist/therapist assistant . I have personally read, edited and approve of the note as written.   Trotter,Margaret PT, DPT 03/20/2016, 3:12 PM  [place] MAIN [facility] SERVICES [address] [place], [place], [address] Phone: [phone]   Fax:  [phone]  Name: [name] MRN: [id number] Date of Birth: [birth year]

## 2016-03-24 ENCOUNTER — Ambulatory Visit (INDEPENDENT_AMBULATORY_CARE_PROVIDER_SITE_OTHER): Payer: Medicare Other | Admitting: General Surgery

## 2016-03-24 ENCOUNTER — Encounter: Payer: Self-pay | Admitting: Physical Therapy

## 2016-03-24 ENCOUNTER — Ambulatory Visit: Payer: Medicare Other | Admitting: Physical Therapy

## 2016-03-24 ENCOUNTER — Encounter: Payer: Self-pay | Admitting: General Surgery

## 2016-03-24 VITALS — BP 130/72 | HR 70 | Resp 12 | Ht 64.0 in | Wt 149.0 lb

## 2016-03-24 DIAGNOSIS — R2681 Unsteadiness on feet: Secondary | ICD-10-CM | POA: Diagnosis not present

## 2016-03-24 DIAGNOSIS — R262 Difficulty in walking, not elsewhere classified: Secondary | ICD-10-CM

## 2016-03-24 DIAGNOSIS — D0512 Intraductal carcinoma in situ of left breast: Secondary | ICD-10-CM

## 2016-03-24 DIAGNOSIS — M6281 Muscle weakness (generalized): Secondary | ICD-10-CM

## 2016-03-24 DIAGNOSIS — M25551 Pain in right hip: Secondary | ICD-10-CM

## 2016-03-24 NOTE — Patient Instructions (Addendum)
  FLEXION: Standing - Stable (Active)    Stand, both feet flat. Lift right knee toward ceiling. Complete __2_ sets of __10_ repetitions. Perform __2_ sessions per day.  http://gtsc.exer.us/29   Copyright  VHI. All rights reserved.  Band Walk: Side Stepping    Tie band around legs, just above knees. Step _1__ feet to one side, then step back to start. Repeat _3__ times each direction. Note: Small towel between band and skin eases rubbing.  http://plyo.exer.us/76   Copyright  VHI. All rights reserved.  Stabilization: Transverse Abdominus Contraction - Supine    Lie with knees bent, feet flat. Place fingers on abdominal muscles just inside pelvis. Contract abdominals, pulling naval toward spine. Lift each leg and then relax. Repeat.  Repeat __2__ times per set. Do __8__ sets per session. Do __5__ sessions per week.  Copyright  VHI. All rights reserved.

## 2016-03-24 NOTE — Patient Instructions (Signed)
Patient to follow up in May 2018 with bilateral diagnostic mammogram.

## 2016-03-24 NOTE — Progress Notes (Signed)
Patient ID: [name], female   DOB: September 23, 1940, 75 y.o.   MRN: [id number]  Chief Complaint  Patient presents with  . Follow-up    Left breast     HPI [name] is a 75 y.o. female here today for a left breast follow up. Patient states still has tenderness. She reports some diarrhea and vomiting after discontinued neratinib 2 weeks ago. Her aid Landis Gandy is present.  HPI  Past Medical History:  Diagnosis Date  . Arthritis   . 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 Breast cancer of upper-inner quadrant of left female breast ([place]) 12/15/14   Completed radiation end of December and finished chemotherapy 2 weeks ago, Left breast invasive mammary carcinoma, T1cN11mc (1.5 cm); Grade 3, [name], [name], [name], .  .Marland KitchenCataract    bilat   . 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 Hearing loss   . History of cancer chemotherapy   . History of radiation therapy   . Imbalance   . Memory impairment    seen by Dr SManuella Ghazi possible post crainiotomy from radiation  . Numbness and tingling    right hand   . Shoulder pain, right   . Wears glasses     Past Surgical History:  Procedure Laterality Date  . APPENDECTOMY  1950  . BRAIN SURGERY  1995   left frontal/temporal  . BREAST BIOPSY Left 1997  . BREAST BIOPSY Left 12/15/14   confirmed DCIS  . BREAST BIOPSY Left 01/08/2015   Procedure: BREAST BIOPSY WITH NEEDLE LOCALIZATION;  Surgeon: [name] MD;  Location: [facility];  Service: General;  Laterality: Left;  . BREAST LUMPECTOMY Left 01/08/2015   Procedure: LUMPECTOMY;  Surgeon: [name] MD;  Location: [facility];  Service: General;  Laterality: Left;  . COLONOSCOPY  2010   Dr. ETiffany Kocher . PORTACATH PLACEMENT Right 01/16/2015   Procedure: INSERTION PORT-A-CATH;  Surgeon: [name] MD;  Location: [facility];  Service: General;  Laterality: Right;  . SENTINEL NODE BIOPSY Left 01/16/2015   Procedure: SENTINEL  NODE BIOPSY;  Surgeon: [name] MD;  Location: [facility];  Service: General;  Laterality: Left;  . TOTAL HIP ARTHROPLASTY Right 09/04/2015   Procedure: RIGHT TOTAL HIP ARTHROPLASTY ANTERIOR APPROACH;  Surgeon: MParalee Cancel MD;  Location: [facility];  Service: Orthopedics;  Laterality: Right;    Family History  Problem Relation Age of Onset  . Breast cancer Sister 445 . Addison's disease Mother   . Stroke Father     Social History Social History  Substance Use Topics  . Smoking status: Former Smoker    Packs/day: 0.25    Years: 5.00    Types: Cigarettes    Quit date: 07/28/1972  . Smokeless tobacco: Never Used  . Alcohol use 0.6 - 1.2 oz/week    1 - 2 Glasses of wine per week     Comment: 1 Glass Wine / Night    Allergies  Allergen Reactions  . Donepezil     Other reaction(s): Other (See Comments) Nightmares    Current Outpatient Prescriptions  Medication Sig Dispense Refill  . Ascorbic Acid (VITAMIN C) 100 MG tablet Take 100 mg by mouth every morning.     . Calcium Carbonate-Vit D-Min (CALTRATE 600+D PLUS MINERALS) 600-800 MG-UNIT TABS Take 1 tablet by mouth every morning.    . ferrous sulfate 325 (65  FE) MG tablet Take 1 tablet (325 mg total) by mouth 3 (three) times daily after meals.  3  . lidocaine-prilocaine (EMLA) cream Apply 1 application topically as needed. 30 g 3  . loratadine (CLARITIN) 10 MG tablet Take 10 mg by mouth 2 (two) times daily.    . meloxicam (MOBIC) 15 MG tablet     . naproxen sodium (ANAPROX) 220 MG tablet Take 220 mg by mouth 2 (two) times daily with a meal.    . polyethylene glycol (MIRALAX / GLYCOLAX) packet Take 17 g by mouth 2 (two) times daily. 14 each 0  . polyvinyl alcohol (LIQUIFILM TEARS) 1.4 % ophthalmic solution Place 1 drop into both eyes daily.     . traMADol (ULTRAM) 50 MG tablet      No current facility-administered medications for this visit.     Review of Systems Review of Systems  Constitutional: Negative.    Respiratory: Negative.   Cardiovascular: Negative.     Blood pressure 130/72, pulse 70, resp. rate 12, height 5' 4"  (1.626 m), weight 149 lb (67.6 kg).  Physical Exam Physical Exam  Constitutional: She is oriented to person, place, and time. She appears well-developed and well-nourished.  Cardiovascular: Normal rate, regular rhythm and normal heart sounds.   Pulmonary/Chest: Effort normal and breath sounds normal. Right breast exhibits no inverted nipple, no mass, no nipple discharge, no skin change and no tenderness. Left breast exhibits tenderness (axilla ). Left breast exhibits no inverted nipple, no mass, no nipple discharge and no skin change.    thickening medial at 9 o'clock along incision.   Lymphadenopathy:    She has no cervical adenopathy.    She has no axillary adenopathy.  Neurological: She is alert and oriented to person, place, and time.  Significant evidence of ongoing short-term memory loss.   Skin: Skin is warm and dry.    Data Reviewed CBC and comprehensive metabolic panel of 67/20/9198 was notable for very mild hyponatremia with a sodium of 132, modest elevation of the blood sugar 168, stable but low calcium at 8.6. Normal CBC.  Assessment    No evidence of progressive necrosis involving the left breast.  Ongoing memory loss.    Plan         Patient to follow up in May 2018 with bilateral diagnostic mammogram.     [name] 03/24/2016, 8:33 PM

## 2016-03-24 NOTE — Therapy (Signed)
[place] MAIN [facility] SERVICES [address] [place], [place], [address] Phone: [phone]   Fax:  [phone]  Physical Therapy Treatment  Patient Details  Name: [name] MRN: [id number] Date of Birth: [birth year] Referring Provider: Dr. Pryor Ochoa  Encounter Date: 03/24/2016      PT End of Session - 03/24/16 0952    Visit Number 9   Number of Visits 13   Date for PT Re-Evaluation 04/03/16   Authorization Type Gcode 9   Authorization Time Period 10   PT Start Time 0803   PT Stop Time 0847   PT Time Calculation (min) 44 min   Equipment Utilized During Treatment Gait belt   Activity Tolerance Patient tolerated treatment well   Behavior During Therapy [facility] for tasks assessed/performed      Past Medical History:  Diagnosis Date  . Arthritis   . 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 Breast cancer of upper-inner quadrant of left female breast ([place]) 12/15/14   Completed radiation end of December and finished chemotherapy 2 weeks ago, Left breast invasive mammary carcinoma, T1cN14mc (1.5 cm); Grade 3, [name], [name], [name], .  .Marland KitchenCataract    bilat   . 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 Hearing loss   . History of cancer chemotherapy   . History of radiation therapy   . Imbalance   . Memory impairment    seen by Dr SManuella Ghazi possible post crainiotomy from radiation  . Numbness and tingling    right hand   . Shoulder pain, right   . Wears glasses     Past Surgical History:  Procedure Laterality Date  . APPENDECTOMY  1950  . BRAIN SURGERY  1995   left frontal/temporal  . BREAST BIOPSY Left 1997  . BREAST BIOPSY Left 12/15/14   confirmed DCIS  . BREAST BIOPSY Left 01/08/2015   Procedure: BREAST BIOPSY WITH NEEDLE LOCALIZATION;  Surgeon: [name] MD;  Location: [facility] ORS;  Service: General;  Laterality: Left;  . BREAST LUMPECTOMY Left 01/08/2015   Procedure: LUMPECTOMY;   Surgeon: [name] MD;  Location: [facility] ORS;  Service: General;  Laterality: Left;  . COLONOSCOPY  2010   Dr. ETiffany Kocher . PORTACATH PLACEMENT Right 01/16/2015   Procedure: INSERTION PORT-A-CATH;  Surgeon: [name] MD;  Location: [facility] ORS;  Service: General;  Laterality: Right;  . SENTINEL NODE BIOPSY Left 01/16/2015   Procedure: SENTINEL NODE BIOPSY;  Surgeon: [name] MD;  Location: [facility] ORS;  Service: General;  Laterality: Left;  . TOTAL HIP ARTHROPLASTY Right 09/04/2015   Procedure: RIGHT TOTAL HIP ARTHROPLASTY ANTERIOR APPROACH;  Surgeon: MParalee Cancel MD;  Location: [facility];  Service: Orthopedics;  Laterality: Right;    There were no vitals filed for this visit.      Subjective Assessment - 03/24/16 0806    Subjective Patient is accompanied by a friend today. Patient noted that she was sick this last week and was vomitting so she wasn't able to perform her stretching exercises or HEP as much. She states that she is having an increase in RLE hip pain today and continued balance issues. Patient reported performing exercise bike at home last night.    Pertinent History Factors Affecting History: PMH of CA, inner ear problems, and recent R THA, supportive husband/family, 19 stairs to go from downstairs to upstairs bedroom  Limitations Walking;Standing   How long can you sit comfortably? NA   How long can you stand comfortably? 10 minutes    How long can you walk comfortably? 100 yds.   Patient Stated Goals Patient wishes to address balance and strength so she can walk more comfortably. Her husband wishes for her to gain confidence in her abilities to perform functional activities.    Currently in Pain? Yes   Pain Score 5    Pain Location Hip   Pain Orientation Right   Multiple Pain Sites Yes   Pain Score 1   Pain Location Hip   Pain Orientation Left     Treatment: NuStep x2 minutes, BUE/LE, patient requires x2 rest breaks due to LE fatigue, started at L2 x1  min. Decreased to L1 for remainder of the time (unbilled). Manual Therapy: Supine light cross friction soft tissue mobilization of IT band bilaterally, ~5 minutes, patient has point tenderness BLE (R>L) TherEx: Patient re-instructed in active assistive IT band stretch, min VCs to perform with hip adduction as well as flexion and to not stretch into pain, 3x15 seconds each LE,  Initiated into HEP; handout given (3 following exercises): Supine transverse abdominus marches, 2x10, min-mod VCs to perform mild core contraction lift each LE x1, relax core, and repeat   Lateral walking with red tband, 2 HHA on // bars, x3 passes each direction, min VCs to perform at home with HHA, patient given red tband for home, independent in doffing/donning.  Standing marches, 2x10, no HHA, min VCs to perform at home with back against a wall and chair in front for safety. Neuromuscular Re-ed: Leading LE on dynadisc, following LE on firm surface:    Static stance, x30 seconds each LE leading, minA for stability, no HHA    Vertical cervical head movements, x10, each LE leading no HHA, min-mod A forr stability, loss of balance post x2. Forward LE dot taps, 2x10, minA for stability, no HHA, min VCs to shift weight forward onto leading LE and maintain hip stability on stance leg.  Forward and backwards walking on airex balance beam, x6, minA-modA for stability, min VCs to perform without UE support, at end of forward walking patient shot a ball into a basket with no UE support.                              PT Education - 03/24/16 0950    Education provided Yes   Education Details HEP progressed, balance activities   Person(s) Educated Patient;Other (comment)   Methods Explanation;Demonstration;Verbal cues;Handout   Comprehension Verbalized understanding;Returned demonstration;Verbal cues required             PT Long Term Goals - 02/21/16 1037      PT LONG TERM GOAL #1   Title Patient  will improve functional gait assessment to >22/30 in order to decrease fear of falling and improve ease of community ambulation.   Time 6   Period Weeks   Status New     PT LONG TERM GOAL #2   Title Patient will be independent in HEP in order to increase patient's ability to maintain gains achieved in therapy and assist with return to PLOF.    Time 6   Period Weeks   Status New     PT LONG TERM GOAL #3   Title Patient will increase overall strength to 4+/5 in BLE to increase ROM/strength throughout functional activities performed in her  ADLs.    Time 6   Period Weeks   Status New     PT LONG TERM GOAL #4   Title Patient will increase her 10 m walk test time to >1.0 m/s in order to be a community ambulator at decreased risk for falls.    Time 6   Period Weeks   Status New     PT LONG TERM GOAL #5   Title Patient will report a worst pain in right hip of 3/10 in last few days to demonstrate improved tolerance with ADLs.   Time 6   Period Weeks   Status New               Plan - 03/24/16 5465    Clinical Impression Statement Patient is accompanied by friend for therapy today.  Patient continues to report RLE hip pain. Upon palpation, patient continues to have tenderness in bilateral IT bands with pain in R>L. Patient is able to show active assistive IT band stretching with min VCs although she states she wasn't able to perform them at home. Patient requires min-modA during balance activities especially when requiring single limb support. She was able to perform exercises well after min-mod VCs. She was given min VCs to remember when she walks to maintain an upright position and attempt to limit her lateral trunk lean. She denies increase in B hip pain and states it is just the constant pain throughout. Patient would continue to benefit from skilled PT in order to address general balance and safety with mobility.    Rehab Potential Good   Clinical Impairments Affecting Rehab  Potential Postive Factors: Good family support, motivated; Negative Factors: PMH including CA, OA, recent R THA; Clinical Impression: Evolving- falls risk, involved PMH, inner ear problems    PT Frequency 2x / week   PT Duration 6 weeks   PT Treatment/Interventions ADLs/Self Care Home Management;Biofeedback;Cryotherapy;Moist Heat;DME Instruction;Gait training;Stair training;Functional mobility training;Therapeutic activities;Therapeutic exercise;Balance training;Neuromuscular re-education;Patient/family education;Manual techniques;Energy conservation;Vestibular   PT Next Visit Plan build HEP with more balance, dynamic balance, core work, single leg stance   PT Home Exercise Plan HEP progressed, see patient instructions   Consulted and Agree with Plan of Care Patient;Family member/caregiver      Patient will benefit from skilled therapeutic intervention in order to improve the following deficits and impairments:  Decreased activity tolerance, Decreased balance, Decreased coordination, Decreased endurance, Decreased mobility, Decreased safety awareness, Decreased strength, Difficulty walking, Hypomobility, Impaired sensation, Impaired UE functional use, Improper body mechanics, Postural dysfunction, Impaired flexibility, Pain  Visit Diagnosis: Unsteadiness on feet  Pain in right hip  Difficulty in walking, not elsewhere classified  Muscle weakness (generalized)     Problem List Patient Active Problem List   Diagnosis Date Noted  . Cancer of overlapping sites of left female breast ([place]) 01/23/2016  . Left breast mass 12/31/2015  . S/P right THA, AA 09/04/2015  . History of artificial joint 09/04/2015  . 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 Malignant neoplasm of female breast ([place]) 07/18/2015  . Neoplasm of meninges ([place]) 07/18/2015  . Breast cancer metastasized to axillary lymph node ([place]) 01/24/2015  . DDD (degenerative disc disease), lumbar 01/02/2015  .  HLD (hyperlipidemia) 01/02/2015  . Benign neoplasm of meninges ([place]) 01/02/2015  . Arthritis, degenerative 01/02/2015  . Psoriasis 01/02/2015  . Rotator cuff arthropathy 09/15/2012   Tilman Neat, SPT This entire session was performed under direct supervision and direction of a licensed therapist/therapist assistant . I  have personally read, edited and approve of the note as written.  Trotter,Margaret PT, DPT 03/24/2016, 11:57 AM  [place] MAIN [facility] SERVICES [address] [place], [place], [address] Phone: [phone]   Fax:  [phone]  Name: LENNETTE FADER MRN: [id number] Date of Birth: 10-23-1940

## 2016-03-26 ENCOUNTER — Encounter: Payer: Self-pay | Admitting: Physical Therapy

## 2016-03-26 ENCOUNTER — Ambulatory Visit: Payer: Medicare Other | Admitting: Physical Therapy

## 2016-03-26 DIAGNOSIS — M6281 Muscle weakness (generalized): Secondary | ICD-10-CM

## 2016-03-26 DIAGNOSIS — R262 Difficulty in walking, not elsewhere classified: Secondary | ICD-10-CM

## 2016-03-26 DIAGNOSIS — M25551 Pain in right hip: Secondary | ICD-10-CM

## 2016-03-26 DIAGNOSIS — R2681 Unsteadiness on feet: Secondary | ICD-10-CM | POA: Diagnosis not present

## 2016-03-26 NOTE — Therapy (Signed)
[place] MAIN [facility] SERVICES [address] [place], [place], [address] Phone: [phone]   Fax:  [phone]  Physical Therapy Treatment  Patient Details  Name: [name] MRN: [id number] Date of Birth: 10-19-1940 Referring Provider: Dr. Pryor Ochoa  Encounter Date: 03/26/2016      PT End of Session - 03/26/16 1012    Visit Number 10   Number of Visits 13   Date for PT Re-Evaluation 04/03/16   Authorization Type Gcode 10   Authorization Time Period 10   PT Start Time 0807   PT Stop Time 0846   PT Time Calculation (min) 39 min   Equipment Utilized During Treatment Gait belt   Activity Tolerance Patient tolerated treatment well   Behavior During Therapy [facility] for tasks assessed/performed      Past Medical History:  Diagnosis Date  . Arthritis   . 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 Breast cancer of upper-inner quadrant of left female breast ([place]) 12/15/14   Completed radiation end of December and finished chemotherapy 2 weeks ago, Left breast invasive mammary carcinoma, T1cN37mc (1.5 cm); Grade 3, [name], [name], [name], .  .Marland KitchenCataract    bilat   . 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 Hearing loss   . History of cancer chemotherapy   . History of radiation therapy   . Imbalance   . Memory impairment    seen by Dr SManuella Ghazi possible post crainiotomy from radiation  . Numbness and tingling    right hand   . Shoulder pain, right   . Wears glasses     Past Surgical History:  Procedure Laterality Date  . APPENDECTOMY  1950  . BRAIN SURGERY  1995   left frontal/temporal  . BREAST BIOPSY Left 1997  . BREAST BIOPSY Left 12/15/14   confirmed DCIS  . BREAST BIOPSY Left 01/08/2015   Procedure: BREAST BIOPSY WITH NEEDLE LOCALIZATION;  Surgeon: [name] MD;  Location: [facility] ORS;  Service: General;  Laterality: Left;  . BREAST LUMPECTOMY Left 01/08/2015   Procedure:  LUMPECTOMY;  Surgeon: [name] MD;  Location: [facility] ORS;  Service: General;  Laterality: Left;  . COLONOSCOPY  2010   Dr. ETiffany Kocher . PORTACATH PLACEMENT Right 01/16/2015   Procedure: INSERTION PORT-A-CATH;  Surgeon: [name] MD;  Location: [facility] ORS;  Service: General;  Laterality: Right;  . SENTINEL NODE BIOPSY Left 01/16/2015   Procedure: SENTINEL NODE BIOPSY;  Surgeon: [name] MD;  Location: [facility] OR[facility];  Service: General;  Laterality: Left;  . TOTAL HIP ARTHROPLASTY Right 09/04/2015   Procedure: RIGHT TOTAL HIP ARTHROPLASTY ANTERIOR APPROACH;  Surgeon: MParalee Cancel MD;  Location: [facility];  Service: Orthopedics;  Laterality: Right;    There were no vitals filed for this visit.      Subjective Assessment - 03/26/16 0811    Subjective Patient notes she was able to do HEP except for stretches. She notes she has been feeling better this week. Denies falls or hip pain unless palpated.    Pertinent History Factors Affecting History: PMH of CA, inner ear problems, and recent R THA, supportive husband/family, 19 stairs to go from downstairs to upstairs bedroom    Limitations Walking;Standing   How long can you sit comfortably? NA   How long can you stand comfortably? 10 minutes    How long can you walk comfortably?  100 yds.   Patient Stated Goals Patient wishes to address balance and strength so she can walk more comfortably. Her husband wishes for her to gain confidence in her abilities to perform functional activities.    Currently in Pain? No/denies      Treatment:  Re-educated on HEP Supine transverse abdominus contraction with alternating single leg lift, x10, min VCs for initial positioning and to lessen the amount of core contraction to decrease bearing down.  Standing marches, x10, no HHA, min VCs to perform with chair in front for safety, CGA for safety. TherEx: Mini wall squats, 2x10, min VCs to perform glut contraction when moving into hip extension, no  HHA, CGA for safety, patient denies hip pain but during second set indicates increased RLE knee pain, discontinued and pain subsided and did not return with sitting rest break.   Gait Training: Instructed patient to ambulate with SPC ~100 feet, CGA for SPT, min VCs to maintain upright posture and decrease lateral trunk especially to R side.  Obstacle course involving stepping on uneven surfaces, stepping over object, and weaving between cones, x4 passes, min VCs on how to use quad cane with movement and step overs, CGA for safety.    Neuromuscular Re-ed: BOSU ball alternating LE taps, 2x10, no HHA, minA for stability, min VCs to maintain upright posture, decrease lateral trunk lean, and regain balance before performing next step.  BOSU ball step ups, x5 (3 with LLE leading, 2 with RLE leading). 1-2 HHA to step onto BOSU ball, no HHA to maintain feet apart stance x30 seconds, min-modA for stability.  Stepping over half bolsters, x5 passes in // bars, CGA-minA for stability, min VCs to move closer to the object before stepping over, once improved, min VCs to step over object without stopping for more natural pattern.  Agility ladder Forwards/sideways stepping, one foot in each square (forward), two feet in each square (sideways), x4 passes each, no HHA,  minA for stability, min VCs to stop and regain balance before proceeding if needed.                         PT Education - 03/26/16 1012    Education provided Yes   Education Details bringing [place] to PT, balance activities, continuing HEP   Person(s) Educated Patient   Methods Explanation   Comprehension Verbalized understanding             PT Long Term Goals - 03/26/16 1016      PT LONG TERM GOAL #1   Title Patient will improve functional gait assessment to >22/30 in order to decrease fear of falling and improve ease of community ambulation.   Time 6   Period Weeks   Status On-going     PT LONG TERM GOAL #2    Title Patient will be independent in HEP in order to increase patient's ability to maintain gains achieved in therapy and assist with return to PLOF.    Time 6   Period Weeks   Status On-going     PT LONG TERM GOAL #3   Title Patient will increase overall strength to 4+/5 in BLE to increase ROM/strength throughout functional activities performed in her ADLs.    Time 6   Period Weeks   Status Partially Met     PT LONG TERM GOAL #4   Title Patient will increase her 10 m walk test time to >1.0 m/s in order to be a community ambulator  at decreased risk for falls.    Time 6   Period Weeks   Status On-going     PT LONG TERM GOAL #5   Title Patient will report a worst pain in right hip of 3/10 in last few days to demonstrate improved tolerance with ADLs.   Time 6   Period Weeks   Status On-going               Plan - 16-Apr-2016 1013    Clinical Impression Statement Patient denies hip pain today unless pressing on that area. Patient was able to demonstrate new exercises initiated at home with good movements and requires min VCs for technique correction. Patient has a mild increase in R knee pain during mini wall squats. This pain discontinues with stopping exercise. Patient able to perform balance training with UE support today; although she is consistently minA-modA for stability. Patient would continue to benefit from skilled PT in order to address dynamic balance issues, safety with mobility, and decrease hip pain.    Rehab Potential Good   Clinical Impairments Affecting Rehab Potential Postive Factors: Good family support, motivated; Negative Factors: PMH including CA, OA, recent R THA; Clinical Impression: Evolving- falls risk, involved PMH, inner ear problems    PT Frequency 2x / week   PT Duration 6 weeks   PT Treatment/Interventions ADLs/Self Care Home Management;Biofeedback;Cryotherapy;Moist Heat;DME Instruction;Gait training;Stair training;Functional mobility training;Therapeutic  activities;Therapeutic exercise;Balance training;Neuromuscular re-education;Patient/family education;Manual techniques;Energy conservation;Vestibular   PT Next Visit Plan build HEP with more balance, dynamic balance, core work, single leg stance   PT Home Exercise Plan HEP maintained, see patient instructions   Consulted and Agree with Plan of Care Patient;Family member/caregiver      Patient will benefit from skilled therapeutic intervention in order to improve the following deficits and impairments:  Decreased activity tolerance, Decreased balance, Decreased coordination, Decreased endurance, Decreased mobility, Decreased safety awareness, Decreased strength, Difficulty walking, Hypomobility, Impaired sensation, Impaired UE functional use, Improper body mechanics, Postural dysfunction, Impaired flexibility, Pain  Visit Diagnosis: Unsteadiness on feet  Pain in right hip  Difficulty in walking, not elsewhere classified  Muscle weakness (generalized)       G-Codes - 04/16/16 1052    Functional Assessment Tool Used clinical judgement, strength/gait ability   Functional Limitation Mobility: Walking and moving around   Mobility: Walking and Moving Around Current Status [phone]) At least 20 percent but less than 40 percent impaired, limited or restricted   Mobility: Walking and Moving Around Goal Status [phone]) At least 1 percent but less than 20 percent impaired, limited or restricted      Problem List Patient Active Problem List   Diagnosis Date Noted  . Cancer of overlapping sites of left female breast ([place]) 01/23/2016  . S/P right THA, AA 09/04/2015  . History of artificial joint 09/04/2015  . 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 Malignant neoplasm of female breast ([place]) 07/18/2015  . Neoplasm of meninges ([place]) 07/18/2015  . Breast cancer metastasized to axillary lymph node ([place]) 01/24/2015  . DDD (degenerative disc disease), lumbar 01/02/2015  . HLD  (hyperlipidemia) 01/02/2015  . Benign neoplasm of meninges ([place]) 01/02/2015  . Arthritis, degenerative 01/02/2015  . Psoriasis 01/02/2015  . Rotator cuff arthropathy 09/15/2012   Tilman Neat, SPT This entire session was performed under direct supervision and direction of a licensed therapist/therapist assistant . I have personally read, edited and approve of the note as written.  Trotter,Margaret  PT, DPT 04-16-16, 10:54 AM  [place] MAIN [facility] SERVICES [address] [place], [place], [address] Phone: [phone]   Fax:  [phone]  Name: [name] MRN: [id number] Date of Birth: Jul 11, 1941

## 2016-04-01 ENCOUNTER — Ambulatory Visit: Payer: Medicare Other | Attending: Otolaryngology | Admitting: Physical Therapy

## 2016-04-01 ENCOUNTER — Encounter: Payer: Self-pay | Admitting: Physical Therapy

## 2016-04-01 DIAGNOSIS — M25551 Pain in right hip: Secondary | ICD-10-CM | POA: Insufficient documentation

## 2016-04-01 DIAGNOSIS — M6281 Muscle weakness (generalized): Secondary | ICD-10-CM

## 2016-04-01 DIAGNOSIS — R262 Difficulty in walking, not elsewhere classified: Secondary | ICD-10-CM | POA: Diagnosis present

## 2016-04-01 DIAGNOSIS — R2681 Unsteadiness on feet: Secondary | ICD-10-CM | POA: Insufficient documentation

## 2016-04-01 NOTE — Patient Instructions (Addendum)
  ABDUCTION: Standing - Resistance Band (Active)    Stand, feet flat. Against yellow resistance band, lift right leg out to side. Complete _2__ sets of _8__ repetitions. Perform _2__ sessions per day.  http://gtsc.exer.us/117   Copyright  VHI. All rights reserved.   If with red resistance band is too much, do without band. If continues to cause pain, stop and tell me the next visit.

## 2016-04-01 NOTE — Therapy (Signed)
[place] MAIN [facility] SERVICES [address] [place], [place], [address] Phone: [phone]   Fax:  [phone]  Physical Therapy Treatment  Patient Details  Name: [name] MRN: [id number] Date of Birth: 01-30-1941 Referring Provider: Dr. Pryor Ochoa  Encounter Date: 04/01/2016      PT End of Session - 04/01/16 0851    Visit Number 11   Number of Visits 13   Date for PT Re-Evaluation 04/03/16   Authorization Type Gcode 1   Authorization Time Period 10   PT Start Time 0803   PT Stop Time 0846   PT Time Calculation (min) 43 min   Equipment Utilized During Treatment Gait belt   Activity Tolerance Patient tolerated treatment well   Behavior During Therapy [facility] for tasks assessed/performed      Past Medical History:  Diagnosis Date  . Arthritis   . 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 Breast cancer of upper-inner quadrant of left female breast ([place]) 12/15/14   Completed radiation end of December and finished chemotherapy 2 weeks ago, Left breast invasive mammary carcinoma, T1cN46mc (1.5 cm); Grade 3, [name], [name], [name], .  .Marland KitchenCataract    bilat   . 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 Hearing loss   . History of cancer chemotherapy   . History of radiation therapy   . Imbalance   . Memory impairment    seen by Dr SManuella Ghazi possible post crainiotomy from radiation  . Numbness and tingling    right hand   . Shoulder pain, right   . Wears glasses     Past Surgical History:  Procedure Laterality Date  . APPENDECTOMY  1950  . BRAIN SURGERY  1995   left frontal/temporal  . BREAST BIOPSY Left 1997  . BREAST BIOPSY Left 12/15/14   confirmed DCIS  . BREAST BIOPSY Left 01/08/2015   Procedure: BREAST BIOPSY WITH NEEDLE LOCALIZATION;  Surgeon: [name] MD;  Location: [facility] ORS;  Service: General;  Laterality: Left;  . BREAST LUMPECTOMY Left 01/08/2015   Procedure: LUMPECTOMY;   Surgeon: [name] MD;  Location: [facility] ORS;  Service: General;  Laterality: Left;  . COLONOSCOPY  2010   Dr. ETiffany Kocher . PORTACATH PLACEMENT Right 01/16/2015   Procedure: INSERTION PORT-A-CATH;  Surgeon: [name] MD;  Location: [facility] ORS;  Service: General;  Laterality: Right;  . SENTINEL NODE BIOPSY Left 01/16/2015   Procedure: SENTINEL NODE BIOPSY;  Surgeon: [name] MD;  Location: [facility] ORS;  Service: General;  Laterality: Left;  . TOTAL HIP ARTHROPLASTY Right 09/04/2015   Procedure: RIGHT TOTAL HIP ARTHROPLASTY ANTERIOR APPROACH;  Surgeon: MParalee Cancel MD;  Location: [facility];  Service: Orthopedics;  Laterality: Right;    There were no vitals filed for this visit.      Subjective Assessment - 04/01/16 0805    Subjective Patient states she has been able to do her HEP, the bike at home, and her LE stretches, and that they are all going well. She notes her pain in his hips is about normal; denies falls. States she is doing well.    Pertinent History Factors Affecting History: PMH of CA, inner ear problems, and recent R THA, supportive husband/family, 19 stairs to go from downstairs to upstairs bedroom    Limitations Walking;Standing   How long can you sit comfortably? NA   How long  can you stand comfortably? 10 minutes    How long can you walk comfortably? 100 yds.   Patient Stated Goals Patient wishes to address balance and strength so she can walk more comfortably. Her husband wishes for her to gain confidence in her abilities to perform functional activities.    Currently in Pain? Yes   Pain Score 4    Pain Location Hip   Pain Orientation Right   Pain Descriptors / Indicators Dull      Treatment: NuStep L2, x4 minutes, BUE/LE, 1 rest break due LE muscular fatigue. History taken for 2 min., 2 minutes unbilled  Neuromuscular Re-ed: Agility ladder: Forward (1 foot in each box) and sideways (2 feet in each box) walking, no HHA, CGA-minA for stability, min VCs  to perform appropriately at the beginning of exercise Static tandem stance with each LE on dynadisc, x30 seconds each LE leading, CGA for stability, min VCs to decrease UE support Static tandem stance with each LE on dyandisc with UE ball catches/throws, x10 each LE leading, min-modA for stability, min VCs to contract glut muscles for stability Trampoline balance activities: Static stand feet apart and feet together, x30 seconds each, CGA for stability Heel raises, 2x10, minA for stability, min VCs to control eccentrically Marches, 2x10, minA for stability, min VCs to control glut muscles for stability  TherEx: Step ups on 4" step, 2x10 each LE leading, CGA for stability, min VCs to control perform step up with leading leg and not pushing with foot on ground and to contract glut muscles to help.   Core isometrics with green pball, patient in hooklying, min-mod VCs to slowly push against ball and hold core contraction, x3 seconds, rest x5 seconds, and to breath throughout; 2x10. Standing hip ABD and extension with yellow tband, x10 each, min VCs to maintain upright position, instructed to perform hip ABD at home.                                PT Education - 04/01/16 0850    Education provided Yes   Education Details HEP progressed, POC, balance and muscular strengthening   Person(s) Educated Patient   Methods Explanation;Handout;Demonstration;Verbal cues   Comprehension Verbalized understanding;Returned demonstration;Verbal cues required             PT Long Term Goals - 03/26/16 1016      PT LONG TERM GOAL #1   Title Patient will improve functional gait assessment to >22/30 in order to decrease fear of falling and improve ease of community ambulation.   Time 6   Period Weeks   Status On-going     PT LONG TERM GOAL #2   Title Patient will be independent in HEP in order to increase patient's ability to maintain gains achieved in therapy and assist with  return to PLOF.    Time 6   Period Weeks   Status On-going     PT LONG TERM GOAL #3   Title Patient will increase overall strength to 4+/5 in BLE to increase ROM/strength throughout functional activities performed in her ADLs.    Time 6   Period Weeks   Status Partially Met     PT LONG TERM GOAL #4   Title Patient will increase her 10 m walk test time to >1.0 m/s in order to be a community ambulator at decreased risk for falls.    Time 6   Period Weeks  Status On-going     PT LONG TERM GOAL #5   Title Patient will report a worst pain in right hip of 3/10 in last few days to demonstrate improved tolerance with ADLs.   Time 6   Period Weeks   Status On-going               Plan - 04/01/16 3846    Clinical Impression Statement Patient continues to denies pain in hips. She states that it is just a normal dull ache. During gait, she continues to lean post and laterally with poor demonstration of core and hip strength. Patient requires min-mod VCs to perform exercises appropriately especially core exercises. Patient requires min-modA during balance activities to maintain/regain stability.  Patient would continue to benefit from skilled PT in ordre to address dynamic balance issues, safety with mobility, and decrease hip pain.    Rehab Potential Good   Clinical Impairments Affecting Rehab Potential Postive Factors: Good family support, motivated; Negative Factors: PMH including CA, OA, recent R THA; Clinical Impression: Evolving- falls risk, involved PMH, inner ear problems    PT Frequency 2x / week   PT Duration 6 weeks   PT Treatment/Interventions ADLs/Self Care Home Management;Biofeedback;Cryotherapy;Moist Heat;DME Instruction;Gait training;Stair training;Functional mobility training;Therapeutic activities;Therapeutic exercise;Balance training;Neuromuscular re-education;Patient/family education;Manual techniques;Energy conservation;Vestibular   PT Next Visit Plan build HEP with  more balance, dynamic balance, core work, single leg stance, core work, and backwards walking, LE leg press   PT Home Exercise Plan HEP progressed, see patient instructions   Consulted and Agree with Plan of Care Patient;Family member/caregiver      Patient will benefit from skilled therapeutic intervention in order to improve the following deficits and impairments:  Decreased activity tolerance, Decreased balance, Decreased coordination, Decreased endurance, Decreased mobility, Decreased safety awareness, Decreased strength, Difficulty walking, Hypomobility, Impaired sensation, Impaired UE functional use, Improper body mechanics, Postural dysfunction, Impaired flexibility, Pain  Visit Diagnosis: Unsteadiness on feet  Pain in right hip  Difficulty in walking, not elsewhere classified  Muscle weakness (generalized)     Problem List Patient Active Problem List   Diagnosis Date Noted  . Cancer of overlapping sites of left female breast ([place]) 01/23/2016  . S/P right THA, AA 09/04/2015  . History of artificial joint 09/04/2015  . 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 Malignant neoplasm of female breast ([place]) 07/18/2015  . Neoplasm of meninges ([place]) 07/18/2015  . Breast cancer metastasized to axillary lymph node ([place]) 01/24/2015  . DDD (degenerative disc disease), lumbar 01/02/2015  . HLD (hyperlipidemia) 01/02/2015  . Benign neoplasm of meninges ([place]) 01/02/2015  . Arthritis, degenerative 01/02/2015  . Psoriasis 01/02/2015  . Rotator cuff arthropathy 09/15/2012   Tilman Neat, SPT This entire session was performed under direct supervision and direction of a licensed therapist/therapist assistant . I have personally read, edited and approve of the note as written.  Trotter,Margaret PT, DPT 04/01/2016, 11:01 AM  [place] MAIN [facility] SERVICES [address] [place], [place], [address] Phone: [phone]   Fax:   [phone]  Name: OAKLEE ESTHER MRN: [id number] Date of Birth: Jul 11, 1941

## 2016-04-02 ENCOUNTER — Encounter: Payer: Self-pay | Admitting: Internal Medicine

## 2016-04-02 ENCOUNTER — Inpatient Hospital Stay: Payer: Medicare Other | Attending: Internal Medicine | Admitting: Internal Medicine

## 2016-04-02 ENCOUNTER — Inpatient Hospital Stay: Payer: Medicare Other

## 2016-04-02 VITALS — BP 130/80 | HR 73 | Temp 95.5°F | Resp 16 | Ht 64.0 in | Wt 152.6 lb

## 2016-04-02 DIAGNOSIS — M1990 Unspecified osteoarthritis, unspecified site: Secondary | ICD-10-CM | POA: Diagnosis not present

## 2016-04-02 DIAGNOSIS — C773 Secondary and unspecified malignant neoplasm of axilla and upper limb lymph nodes: Secondary | ICD-10-CM | POA: Insufficient documentation

## 2016-04-02 DIAGNOSIS — C50212 Malignant neoplasm of upper-inner quadrant of left female breast: Secondary | ICD-10-CM | POA: Insufficient documentation

## 2016-04-02 DIAGNOSIS — R42 Dizziness and giddiness: Secondary | ICD-10-CM | POA: Diagnosis not present

## 2016-04-02 DIAGNOSIS — Z79899 Other long term (current) drug therapy: Secondary | ICD-10-CM | POA: Diagnosis not present

## 2016-04-02 DIAGNOSIS — Z171 Estrogen receptor negative status [ER-]: Secondary | ICD-10-CM | POA: Diagnosis not present

## 2016-04-02 DIAGNOSIS — C50812 Malignant neoplasm of overlapping sites of left female breast: Secondary | ICD-10-CM

## 2016-04-02 DIAGNOSIS — Z87891 Personal history of nicotine dependence: Secondary | ICD-10-CM | POA: Diagnosis not present

## 2016-04-02 LAB — COMPREHENSIVE METABOLIC PANEL
ALBUMIN: 4 g/dL (ref 3.5–5.0)
ALT: 24 U/L (ref 14–54)
ANION GAP: 6 (ref 5–15)
AST: 21 U/L (ref 15–41)
Alkaline Phosphatase: 46 U/L (ref 38–126)
BUN: 14 mg/dL (ref 6–20)
CHLORIDE: 98 mmol/L — AB (ref 101–111)
CO2: 27 mmol/L (ref 22–32)
Calcium: 8.5 mg/dL — ABNORMAL LOW (ref 8.9–10.3)
Creatinine, Ser: 0.66 mg/dL (ref 0.44–1.00)
GFR calc Af Amer: >60 mL/min (ref >60)
GFR calc non Af Amer: >60 mL/min (ref >60)
GLUCOSE: 106 mg/dL — AB (ref 65–99)
POTASSIUM: 4.2 mmol/L (ref 3.5–5.1)
SODIUM: 131 mmol/L — AB (ref 135–145)
Total Bilirubin: 0.6 mg/dL (ref 0.3–1.2)
Total Protein: 6.2 g/dL — ABNORMAL LOW (ref 6.5–8.1)

## 2016-04-02 LAB — CBC WITH DIFFERENTIAL/PLATELET
BASOS ABS: 0 10*3/uL (ref 0–0.1)
BASOS PCT: 1 %
EOS ABS: 0.2 10*3/uL (ref 0–0.7)
EOS PCT: 3 %
HCT: 36 % (ref 35.0–47.0)
Hemoglobin: 12.3 g/dL (ref 12.0–16.0)
Lymphocytes Relative: 13 %
Lymphs Abs: 0.7 10*3/uL — ABNORMAL LOW (ref 1.0–3.6)
MCH: 31 pg (ref 26.0–34.0)
MCHC: 34.1 g/dL (ref 32.0–36.0)
MCV: 91 fL (ref 80.0–100.0)
MONO ABS: 0.4 10*3/uL (ref 0.2–0.9)
Monocytes Relative: 8 %
NEUTROS ABS: 3.9 10*3/uL (ref 1.4–6.5)
Neutrophils Relative %: 75 %
PLATELETS: 325 10*3/uL (ref 150–440)
RBC: 3.96 MIL/uL (ref 3.80–5.20)
RDW: 14.5 % (ref 11.5–14.5)
WBC: 5.2 10*3/uL (ref 3.6–11.0)

## 2016-04-02 MED ORDER — SODIUM CHLORIDE 0.9% FLUSH
10.0000 mL | INTRAVENOUS | Status: DC | PRN
  Administered 2016-04-02: 10 mL via INTRAVENOUS
  Filled 2016-04-02: qty 10

## 2016-04-02 MED ORDER — HEPARIN SOD (PORK) LOCK FLUSH 100 UNIT/ML IV SOLN
INTRAVENOUS | Status: AC
  Filled 2016-04-02: qty 5

## 2016-04-02 MED ORDER — HEPARIN SOD (PORK) LOCK FLUSH 100 UNIT/ML IV SOLN
500.0000 [IU] | Freq: Once | INTRAVENOUS | Status: AC
  Administered 2016-04-02: 500 [IU] via INTRAVENOUS

## 2016-04-02 NOTE — Assessment & Plan Note (Addendum)
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  Progesterone receptor negative.  HER-2/neu receptor positive. S/p- 120m/kg [finished July 2017]; on neratinib 6 pills/day x 5 days. Stopped sec to severe diarrhea.   # We had a lengthy discussion with the husband/patient regarding the pros and cons of continued therapy- with a marginal benefit in ER/[name] tumors; significant side effects; 1 year treatment; given patient's marginal performance status- it was decided to discontinue neratinib.   # G-3 diarrhea- sec to neratinib- discontinue neratinib. Diarrhea resolved.   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  # port flush- every 8 weeks/ wants to keep it in for now.   # follow up with me in 4 months/ labs.

## 2016-04-02 NOTE — Progress Notes (Signed)
Pt reports Nerlynx was making her have N/V and Diarrhea.  Since stopping medciation no more symptoms.

## 2016-04-02 NOTE — Progress Notes (Signed)
[place] OFFICE PROGRESS NOTE  Patient Care Team: [name], [name], MD (General Surgery) Self Requested  Breast cancer metastasized to axillary lymph node [facility])   Staging form: Breast, [name], [name], [name], MD on 01/29/2015    Oncology History   # June 2016- LEFT BREAST CA;  [name] ; [name] ; [name]; Her 2 Neu POS; [place] from July OF 2016; [name]; adjuvant Herceptin [ Finished July 2017]; AUG 2017- Neratinib x5 days; DISCON sec to diarrhea  # June 2017- left breast Bx- fat necrosis [[name]  # MUGA scan- July 28th 2017- 67%.  # chronic gait/balance issues  #july 2017-  BRCA 1& 2- NEG.      Breast cancer metastasized to axillary lymph node ([place])   01/24/2015 Initial Diagnosis    Breast cancer metastasized to axillary lymph node       Cancer of overlapping sites of left female breast ([place])   01/23/2016 Initial Diagnosis    Cancer of overlapping sites of left female breast [facility])        INTERVAL HISTORY:  [name] 75 y.o.  female pleasant patient above history of  Stage I  ER/[name] HER-2/neu positive breast cancer s/p Herceptin-  Adjuvant. Patient approximately 3 weeks ago started on NERATINIB.   Patient noted to have significant diarrhea up to 6 loose stools a day; she took Imodium but no Lomotil. Family had not called. Patient had also bouts of nausea with vomiting. Patient had taken the pills for about 5 days. Since she stopped taking the pills on a symptoms are resolved.  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 No falls. She walks with cane.   Denies any unusual shortness of breath or chest pain. No cough. No swelling in legs.   No headaches or vision changes.  REVIEW OF SYSTEMS:  A complete 10 point  review of system is done which is negative except mentioned above/history of present illness.   PAST MEDICAL HISTORY :  Past Medical History:  Diagnosis Date  . Arthritis   . 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 Breast cancer of upper-inner quadrant of left female breast ([place]) 12/15/14   Completed radiation end of December and finished chemotherapy 2 weeks ago, Left breast invasive mammary carcinoma, T1cN42mc (1.5 cm); Grade 3, [name], [name], [name], .  .Marland KitchenCataract    bilat   . 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 Hearing loss   . History of cancer chemotherapy   . History of radiation therapy   . Imbalance   . Memory impairment    seen by Dr SManuella Ghazi possible post crainiotomy from radiation  . Numbness and tingling    right hand   . Shoulder pain, right   . Wears glasses     PAST SURGICAL HISTORY :   Past Surgical History:  Procedure Laterality Date  . APPENDECTOMY  1950  . BRAIN SURGERY  1995   left frontal/temporal  . BREAST BIOPSY Left 1997  . BREAST BIOPSY Left 12/15/14   confirmed DCIS  . BREAST BIOPSY Left 01/08/2015   Procedure: BREAST BIOPSY WITH NEEDLE LOCALIZATION;  Surgeon:  [name], MD;  Location: [facility] ORS;  Service: General;  Laterality: Left;  . BREAST LUMPECTOMY Left 01/08/2015   Procedure: LUMPECTOMY;  Surgeon: [name], MD;  Location: [facility] ORS;  Service: General;  Laterality: Left;  . COLONOSCOPY  2010   Dr. [name]  . PORTACATH PLACEMENT Right 01/16/2015   Procedure: INSERTION PORT-A-CATH;  Surgeon: [name], MD;  Location: [facility] ORS;  Service: General;  Laterality: Right;  . SENTINEL NODE BIOPSY Left 01/16/2015   Procedure: SENTINEL NODE BIOPSY;  Surgeon: [name], MD;  Location: [facility] ORS;  Service: General;  Laterality: Left;  . TOTAL HIP ARTHROPLASTY Right 09/04/2015   Procedure: RIGHT TOTAL HIP ARTHROPLASTY ANTERIOR APPROACH;  Surgeon: [name], MD;  Location: [facility];   Service: Orthopedics;  Laterality: Right;    FAMILY HISTORY :   Family History  Problem Relation Age of Onset  . Breast cancer Sister 71  . Addison's disease Mother   . Stroke Father     SOCIAL HISTORY:   Social History  Substance Use Topics  . Smoking status: Former Smoker    Packs/day: 0.25    Years: 5.00    Types: Cigarettes    Quit date: 07/28/1972  . Smokeless tobacco: Never Used  . Alcohol use 0.6 - 1.2 oz/week    1 - 2 Glasses of wine per week     Comment: 1 Glass Wine / Night    ALLERGIES:  is allergic to donepezil.  MEDICATIONS:  Current Outpatient Prescriptions  Medication Sig Dispense Refill  . Ascorbic Acid (VITAMIN C) 100 MG tablet Take 100 mg by mouth every morning.     . Calcium Carbonate-Vit D-Min (CALTRATE 600+D PLUS MINERALS) 600-800 MG-UNIT TABS Take 1 tablet by mouth every morning.    . ferrous sulfate 325 (65 FE) MG tablet Take 1 tablet (325 mg total) by mouth 3 (three) times daily after meals.  3  . lidocaine-prilocaine (EMLA) cream Apply 1 application topically as needed. 30 g 3  . loratadine (CLARITIN) 10 MG tablet Take 10 mg by mouth 2 (two) times daily.    . naproxen sodium (ANAPROX) 220 MG tablet Take 220 mg by mouth 2 (two) times daily with a meal.    . polyvinyl alcohol (LIQUIFILM TEARS) 1.4 % ophthalmic solution Place 1 drop into both eyes daily.     . traMADol (ULTRAM) 50 MG tablet     . polyethylene glycol (MIRALAX / GLYCOLAX) packet Take 17 g by mouth 2 (two) times daily. (Patient not taking: Reported on 04/02/2016) 14 each 0   No current facility-administered medications for this visit.     PHYSICAL EXAMINATION: ECOG PERFORMANCE STATUS: 1 - Symptomatic but completely ambulatory  BP 130/80 (BP Location: Right Arm, Patient Position: Sitting)   Pulse 73   Temp (!) 95.5 F (35.3 C) (Tympanic)   Resp 16   Ht _0  (1.626 m)   Wt 152 lb 9.6 oz (69.2 kg)   BMI 26.19 kg/m   Filed Weights   04/02/16 1144  Weight: 152 lb 9.6 oz (69.2 kg)     GENERAL: Well-nourished well-developed; Alert, no distress and comfortable.  With her husband.  Walks with a cane. EYES: no pallor or icterus OROPHARYNX: no thrush or ulceration; good dentition  NECK: supple, no masses felt LYMPH:  no palpable lymphadenopathy in the cervical, axillary or inguinal regions LUNGS: clear to auscultation and  No wheeze or crackles HEART/CVS: regular rate & rhythm and no murmurs; No lower extremity  edema ABDOMEN:abdomen soft, non-tender and normal bowel sounds Musculoskeletal:no cyanosis of digits and no clubbing  PSYCH: alert & oriented x 3 with fluent speech NEURO: no focal motor/sensory deficits SKIN:  no rashes or significant lesions  LABORATORY DATA:  I have reviewed the data as listed    Component Value Date/Time   NA 131 (L) 04/02/2016 1125   K 4.2 04/02/2016 1125   CL 98 (L) 04/02/2016 1125   CO2 27 04/02/2016 1125   GLUCOSE 106 (H) 04/02/2016 1125   BUN 14 04/02/2016 1125   CREATININE 0.66 04/02/2016 1125   CALCIUM 8.5 (L) 04/02/2016 1125   PROT 6.2 (L) 04/02/2016 1125   ALBUMIN 4.0 04/02/2016 1125   AST 21 04/02/2016 1125   ALT 24 04/02/2016 1125   ALKPHOS 46 04/02/2016 1125   BILITOT 0.6 04/02/2016 1125   GFRNONAA >60 04/02/2016 1125   GFRAA >60 04/02/2016 1125    No results found for: SPEP, UPEP  Lab Results  Component Value Date   WBC 5.2 04/02/2016   NEUTROABS 3.9 04/02/2016   HGB 12.3 04/02/2016   HCT 36.0 04/02/2016   MCV 91.0 04/02/2016   PLT 325 04/02/2016      Chemistry      Component Value Date/Time   NA 131 (L) 04/02/2016 1125   K 4.2 04/02/2016 1125   CL 98 (L) 04/02/2016 1125   CO2 27 04/02/2016 1125   BUN 14 04/02/2016 1125   CREATININE 0.66 04/02/2016 1125      Component Value Date/Time   CALCIUM 8.5 (L) 04/02/2016 1125   ALKPHOS 46 04/02/2016 1125   AST 21 04/02/2016 1125   ALT 24 04/02/2016 1125   BILITOT 0.6 04/02/2016 1125     No  focal wall motion abnormality of the left  ventricle. Calculated left ventricular ejection fraction equals 67%. Previously 61% on 11/13/2015 IMPRESSION: Left ventricular ejection fraction equals 67 %. Electronically Signed   By: Suzy Bouchard M.D.   On: 02/20/2016 15:02  RADIOGRAPHIC STUDIES: I have personally reviewed the radiological images as listed and agreed with the findings in the report. No results found.   ASSESSMENT & PLAN:  Cancer of overlapping sites of left female breast ([place]) # invasive carcinoma of breast T1c n1MIC M0; Estrogen receptor negative.  Progesterone receptor negative.  HER-2/neu receptor positive. S/p- 152m/kg [finished July 2017]; on neratinib 6 pills/day x 5 days. Stopped sec to severe diarrhea.   # We had a lengthy discussion with the husband/patient regarding the pros and cons of continued therapy- with a marginal benefit in ER/[name] tumors; significant side effects; 1 year treatment; given patient's marginal performance status- it was decided to discontinue neratinib.   # G-3 diarrhea- sec to neratinib- discontinue neratinib. Diarrhea resolved.   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  # port flush- every 8 weeks/ wants to keep it in for now.   # follow up with me in 4 months/ labs.  Orders Placed This Encounter  Procedures  . CBC with Differential    Standing Status:   Future    Standing Expiration Date:   04/02/2017  . Comprehensive metabolic panel    Standing Status:   Future    Standing Expiration Date:   04/02/2017   All questions were answered. The patient knows to call the clinic with any problems, questions or concerns.      GCammie Sickle MD 04/02/2016 12:57 PM

## 2016-04-03 ENCOUNTER — Ambulatory Visit: Payer: Medicare Other | Admitting: Physical Therapy

## 2016-04-03 ENCOUNTER — Encounter: Payer: Self-pay | Admitting: Physical Therapy

## 2016-04-03 DIAGNOSIS — M6281 Muscle weakness (generalized): Secondary | ICD-10-CM

## 2016-04-03 DIAGNOSIS — R2681 Unsteadiness on feet: Secondary | ICD-10-CM

## 2016-04-03 DIAGNOSIS — R262 Difficulty in walking, not elsewhere classified: Secondary | ICD-10-CM

## 2016-04-03 DIAGNOSIS — M25551 Pain in right hip: Secondary | ICD-10-CM

## 2016-04-03 NOTE — Patient Instructions (Addendum)
WALKING  Walking is a great form of exercise to increase your strength, endurance and overall fitness.  A walking program can help you start slowly and gradually build endurance as you go.  Everyone's ability is different, so each person's starting point will be different.  You do not have to follow them exactly.  The are just samples. You should simply find out what's right for you and stick to that program.   In the beginning, you'll start off walking 2-3 times a day for short distances.  As you get stronger, you'll be walking further at just 1-2 times per day.  A. You Can Walk For A Certain Length Of Time Each Day    Walk 5 minutes 3 times per day.  Increase 2 minutes every 2 days (3 times per day).  Work up to 25-30 minutes (1-2 times per day).   Example:   Day 1-2 5 minutes 3 times per day    Day 7-8 12 minutes 2-3 times per day   Day 13-14 25 minutes 1-2 times per day  B. You Can Walk For a Certain Distance Each Day     Distance can be substituted for time.    Example:   3 trips to mailbox (at road)   3 trips to corner of block   3 trips around the block  C. Go to local [employer] and use the track.    Walk for distance ____ around track  Or time __5__ minutes  D. Walk _x___ Jog ____ Run ___  Please only do the exercises that your therapist has initialed and dated

## 2016-04-03 NOTE — Therapy (Signed)
[place] MAIN [facility] SERVICES [address] [place], [place], [address] Phone: [phone]   Fax:  [phone]  Physical Therapy Treatment/Progress Note  Patient Details  Name: [name] MRN: [id number] Date of Birth: 1941/02/27 Referring Provider: Dr. Pryor Ochoa  Encounter Date: 04/03/2016      PT End of Session - 04/03/16 0853    Visit Number 12   Number of Visits 25   Date for PT Re-Evaluation 05/01/16   Authorization Type Gcode 2   Authorization Time Period 10   PT Start Time 0807   PT Stop Time 0850   PT Time Calculation (min) 43 min   Equipment Utilized During Treatment Gait belt   Activity Tolerance Patient tolerated treatment well;No increased pain   Behavior During Therapy WFL for tasks assessed/performed      Past Medical History:  Diagnosis Date  . Arthritis   . 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 Breast cancer of upper-inner quadrant of left female breast ([place]) 12/15/14   Completed radiation end of December and finished chemotherapy 2 weeks ago, Left breast invasive mammary carcinoma, T1cN23mc (1.5 cm); Grade 3, [name], [name], [name], .  .Marland KitchenCataract    bilat   . 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 Hearing loss   . History of cancer chemotherapy   . History of radiation therapy   . Imbalance   . Memory impairment    seen by Dr SManuella Ghazi possible post crainiotomy from radiation  . Numbness and tingling    right hand   . Shoulder pain, right   . Wears glasses     Past Surgical History:  Procedure Laterality Date  . APPENDECTOMY  1950  . BRAIN SURGERY  1995   left frontal/temporal  . BREAST BIOPSY Left 1997  . BREAST BIOPSY Left 12/15/14   confirmed DCIS  . BREAST BIOPSY Left 01/08/2015   Procedure: BREAST BIOPSY WITH NEEDLE LOCALIZATION;  Surgeon: [name] MD;  Location: [facility] ORS;  Service: General;  Laterality: Left;  . BREAST LUMPECTOMY Left  01/08/2015   Procedure: LUMPECTOMY;  Surgeon: [name] MD;  Location: [facility] ORS;  Service: General;  Laterality: Left;  . COLONOSCOPY  2010   Dr. ETiffany Kocher . PORTACATH PLACEMENT Right 01/16/2015   Procedure: INSERTION PORT-A-CATH;  Surgeon: [name] MD;  Location: [facility] ORS;  Service: General;  Laterality: Right;  . SENTINEL NODE BIOPSY Left 01/16/2015   Procedure: SENTINEL NODE BIOPSY;  Surgeon: [name] MD;  Location: [facility] ORS;  Service: General;  Laterality: Left;  . TOTAL HIP ARTHROPLASTY Right 09/04/2015   Procedure: RIGHT TOTAL HIP ARTHROPLASTY ANTERIOR APPROACH;  Surgeon: MParalee Cancel MD;  Location: [facility];  Service: Orthopedics;  Laterality: Right;    There were no vitals filed for this visit.      Subjective Assessment - 04/03/16 0808    Subjective Patient states she has difficulty with some of the HEP because they are difficulty, especially standing heel to toe. She notes she feels more balanced today.    Pertinent History Factors Affecting History: PMH of CA, inner ear problems, and recent R THA, supportive husband/family, 19 stairs to go from downstairs to upstairs bedroom    Limitations Walking;Standing   How long can you sit comfortably? NA   How long can you stand comfortably? 10 minutes    How long can you  walk comfortably? 100 yds.   Patient Stated Goals Patient wishes to address balance and strength so she can walk more comfortably. Her husband wishes for her to gain confidence in her abilities to perform functional activities.    Currently in Pain? Yes   Pain Score 5    Pain Location Hip   Pain Orientation Right   Pain Descriptors / Indicators Aching            [facility] PT Assessment - 04/03/16 0001      Observation/Other Assessments   Activities of Balance Confidence Scale (ABC Scale)  71.3%, improved from 02/21/16 at 55.6%, <80% indicated patient may be limited with activities     Strength   Right Shoulder Flexion 4+/5   Right Shoulder  Extension 4+/5   Right Shoulder ABduction 4+/5   Left Shoulder Flexion 4+/5   Left Shoulder Extension 4+/5   Left Shoulder ABduction 3-/5   Right Elbow Flexion 4+/5   Right Elbow Extension 4+/5   Left Elbow Flexion 4+/5   Left Elbow Extension 4+/5   Right Hip Flexion 4/5   Right Hip ABduction 4+/5  seated   Left Hip Flexion 4+/5   Left Hip ABduction 4+/5  seated   Right Knee Flexion 4+/5   Right Knee Extension 4+/5   Left Knee Flexion 4+/5   Left Knee Extension 4+/5   Right Ankle Dorsiflexion 5/5   Left Ankle Dorsiflexion 5/5     Standardized Balance Assessment   Five times sit to stand comments  12.1s, improved from 02/21/16 at 12.8 s, >60 y.o. <14 seconds indicates decreased risk for falls   10 Meter Walk 0.74 m/s, supervision, SBQC, decreased from 02/21/16 at 0.83 m/s, <1.0 indicates with increased risk for falls, limited community ambulator     Functional Gait  Assessment   Gait Level Surface Walks 20 ft, slow speed, abnormal gait pattern, evidence for imbalance or deviates 10-15 in outside of the 12 in walkway width. Requires more than 7 sec to ambulate 20 ft.   Change in Gait Speed Able to smoothly change walking speed without loss of balance or gait deviation. Deviate no more than 6 in outside of the 12 in walkway width.   Gait with Horizontal Head Turns Performs head turns smoothly with no change in gait. Deviates no more than 6 in outside 12 in walkway width   Gait with Vertical Head Turns Performs task with slight change in gait velocity (eg, minor disruption to smooth gait path), deviates 6 - 10 in outside 12 in walkway width or uses assistive device   Gait and Pivot Turn Pivot turns safely within 3 sec and stops quickly with no loss of balance.   Step Over Obstacle Is able to step over one shoe box (4.5 in total height) without changing gait speed. No evidence of imbalance.   Gait with Narrow Base of Support Ambulates less than 4 steps heel to toe or cannot perform without  assistance.   Gait with Eyes Closed Walks 20 ft, uses assistive device, slower speed, mild gait deviations, deviates 6-10 in outside 12 in walkway width. Ambulates 20 ft in less than 9 sec but greater than 7 sec.  HHA   Ambulating Backwards Walks 20 ft, uses assistive device, slower speed, mild gait deviations, deviates 6-10 in outside 12 in walkway width.   Steps Alternating feet, must use rail.   Total Score 20      Treatment:  Patient instructed in 5x sit to stand, 10 meter  walk test, functional gait assessment (without AD), and MMT. Patient requires min VCs and minA during gait assessment for stability. Patient has mild hip popping during functional gait assessment (FGA) with no increase in pain. Min VCs to decrease lateral trunk flexion. FGA improved from 04/03/16 at 15/30 to 20/30, >22/30 indicates decreased risk for falls in patients with geriatrics.   Semi tandem stance, 4x30 seconds, each LE leading x2, minA for stability, min VCs to increase difficulty by performing vertical head movements.    Tandem stance, 2x30 seconds, each LE leading, minA for stability with increased difficulty, min VCs to maintain position.  Calf raises, 2x15, min VCs to control eccentrically, CGA-minA for safety, loss of balance posteriorly x2, with patient able to regain with supervision or very light minA.                       PT Education - 04/03/16 0853    Education provided Yes   Education Details POC that includes balance and muscular strengthening, HEP progressed, walking program   Person(s) Educated Patient   Methods Explanation;Handout   Comprehension Verbalized understanding             PT Long Term Goals - 04/03/16 0904      PT LONG TERM GOAL #1   Title Patient will improve functional gait assessment to >22/30 in order to decrease fear of falling and improve ease of community ambulation.   Time 4   Period Weeks   Status Partially Met     PT LONG TERM GOAL #2    Title Patient will be independent in HEP in order to increase patient's ability to maintain gains achieved in therapy and assist with return to PLOF.    Time 4   Period Weeks   Status On-going     PT LONG TERM GOAL #3   Title Patient will increase overall strength to 4+/5 in BLE to increase ROM/strength throughout functional activities performed in her ADLs.    Time 4   Period Weeks   Status Partially Met     PT LONG TERM GOAL #4   Title Patient will increase her 10 m walk test time to >1.0 m/s in order to be a community ambulator at decreased risk for falls.    Time 4   Period Weeks   Status On-going     PT LONG TERM GOAL #5   Title Patient will report a worst pain in right hip of 3/10 in last few days to demonstrate improved tolerance with ADLs.   Time 4   Period Weeks   Status Partially Met               Plan - 04/03/16 0854    Clinical Impression Statement Patient continues to deny hip pain but does state she has the continued ache at 5/10. During gait, patient continues to have hip popping that improves after walking longer periods and backwards warlking. Patient's goals and outcomes measures were re-assessed. She is progressing towards her goals and has made improvements in all areas except her 10 meter walk test. The patient requires minA during narrow base of support and balance activities with eyes closed. Patient would continue to benefit from skilled PT in order to address LE weakness, core strength, dynamic and static balance, and improve funcitonal safety.    Rehab Potential Good   Clinical Impairments Affecting Rehab Potential Postive Factors: Good family support, motivated; Negative Factors: PMH including CA, OA, recent R  THA; Clinical Impression: Evolving- falls risk, involved PMH, inner ear problems    PT Frequency 2x / week   PT Duration 4 weeks   PT Treatment/Interventions ADLs/Self Care Home Management;Biofeedback;Cryotherapy;Moist Heat;DME Instruction;Gait  training;Stair training;Functional mobility training;Therapeutic activities;Therapeutic exercise;Balance training;Neuromuscular re-education;Patient/family education;Manual techniques;Energy conservation;Vestibular   PT Next Visit Plan build HEP with more balance, dynamic balance, core work, single leg stance, core work, and backwards walking, LE leg press   PT Home Exercise Plan HEP progressed, see patient instructions   Consulted and Agree with Plan of Care Patient;Family member/caregiver      Patient will benefit from skilled therapeutic intervention in order to improve the following deficits and impairments:  Decreased activity tolerance, Decreased balance, Decreased coordination, Decreased endurance, Decreased mobility, Decreased safety awareness, Decreased strength, Difficulty walking, Hypomobility, Impaired sensation, Impaired UE functional use, Improper body mechanics, Postural dysfunction, Impaired flexibility, Pain  Visit Diagnosis: Unsteadiness on feet - Plan: PT plan of care cert/re-cert  Pain in right hip - Plan: PT plan of care cert/re-cert  Difficulty in walking, not elsewhere classified - Plan: PT plan of care cert/re-cert  Muscle weakness (generalized) - Plan: PT plan of care cert/re-cert     Problem List Patient Active Problem List   Diagnosis Date Noted  . Cancer of overlapping sites of left female breast ([place]) 01/23/2016  . S/P right THA, AA 09/04/2015  . History of artificial joint 09/04/2015  . 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 Malignant neoplasm of female breast ([place]) 07/18/2015  . Neoplasm of meninges ([place]) 07/18/2015  . Breast cancer metastasized to axillary lymph node ([place]) 01/24/2015  . DDD (degenerative disc disease), lumbar 01/02/2015  . HLD (hyperlipidemia) 01/02/2015  . Benign neoplasm of meninges ([place]) 01/02/2015  . Arthritis, degenerative 01/02/2015  . Psoriasis 01/02/2015  . Rotator cuff arthropathy 09/15/2012   Tilman Neat, SPT This entire session was performed under direct supervision and direction of a licensed therapist/therapist assistant . I have personally read, edited and approve of the note as written.  Trotter,Margaret PT, DPT 04/03/2016, 1:26 PM  [place] MAIN [facility] SERVICES [address] [place], [place], [address] Phone: [phone]   Fax:  [phone]  Name: [name] MRN: [id number] Date of Birth: 08-31-1940

## 2016-04-08 ENCOUNTER — Encounter: Payer: Self-pay | Admitting: Physical Therapy

## 2016-04-08 ENCOUNTER — Ambulatory Visit: Payer: Medicare Other | Admitting: Physical Therapy

## 2016-04-08 DIAGNOSIS — R2681 Unsteadiness on feet: Secondary | ICD-10-CM | POA: Diagnosis not present

## 2016-04-08 DIAGNOSIS — R262 Difficulty in walking, not elsewhere classified: Secondary | ICD-10-CM

## 2016-04-08 DIAGNOSIS — M25551 Pain in right hip: Secondary | ICD-10-CM

## 2016-04-08 DIAGNOSIS — M6281 Muscle weakness (generalized): Secondary | ICD-10-CM

## 2016-04-08 NOTE — Therapy (Signed)
[place] MAIN [facility] SERVICES [address] [place], [place], [address] Phone: [phone]   Fax:  [phone]  Physical Therapy Treatment  Patient Details  Name: [name] MRN: [id number] Date of Birth: September 08, 1940 Referring Provider: Dr. Pryor Ochoa  Encounter Date: 04/08/2016      PT End of Session - 04/08/16 0851    Visit Number 13   Number of Visits 25   Date for PT Re-Evaluation 05/01/16   Authorization Type Gcode 3   Authorization Time Period 10   PT Start Time 0804   PT Stop Time 0846   PT Time Calculation (min) 42 min   Equipment Utilized During Treatment Gait belt   Activity Tolerance Patient tolerated treatment well   Behavior During Therapy [facility] for tasks assessed/performed      Past Medical History:  Diagnosis Date  . Arthritis   . 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 Breast cancer of upper-inner quadrant of left female breast ([place]) 12/15/14   Completed radiation end of December and finished chemotherapy 2 weeks ago, Left breast invasive mammary carcinoma, T1cN52mc (1.5 cm); Grade 3, [name], [name], [name], .  .Marland KitchenCataract    bilat   . 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 Hearing loss   . History of cancer chemotherapy   . History of radiation therapy   . Imbalance   . Memory impairment    seen by Dr SManuella Ghazi possible post crainiotomy from radiation  . Numbness and tingling    right hand   . Shoulder pain, right   . Wears glasses     Past Surgical History:  Procedure Laterality Date  . APPENDECTOMY  1950  . BRAIN SURGERY  1995   left frontal/temporal  . BREAST BIOPSY Left 1997  . BREAST BIOPSY Left 12/15/14   confirmed DCIS  . BREAST BIOPSY Left 01/08/2015   Procedure: BREAST BIOPSY WITH NEEDLE LOCALIZATION;  Surgeon: [name] MD;  Location: [facility] ORS;  Service: General;  Laterality: Left;  . BREAST LUMPECTOMY Left 01/08/2015   Procedure: LUMPECTOMY;   Surgeon: [name] MD;  Location: [facility] ORS;  Service: General;  Laterality: Left;  . COLONOSCOPY  2010   Dr. ETiffany Kocher . PORTACATH PLACEMENT Right 01/16/2015   Procedure: INSERTION PORT-A-CATH;  Surgeon: [name] MD;  Location: [facility] ORS;  Service: General;  Laterality: Right;  . SENTINEL NODE BIOPSY Left 01/16/2015   Procedure: SENTINEL NODE BIOPSY;  Surgeon: [name] MD;  Location: [facility] ORS;  Service: General;  Laterality: Left;  . TOTAL HIP ARTHROPLASTY Right 09/04/2015   Procedure: RIGHT TOTAL HIP ARTHROPLASTY ANTERIOR APPROACH;  Surgeon: MParalee Cancel MD;  Location[facility];  Service: Orthopedics;  Laterality: Right;    There were no vitals filed for this visit.      Subjective Assessment - 04/08/16 0807    Subjective Patient states she has been able to do her HEP but not consistently. She states she does perform her bike at home. She notes her pain in her legs has improved.    Pertinent History Factors Affecting History: PMH of CA, inner ear problems, and recent R THA, supportive husband/family, 19 stairs to go from downstairs to upstairs bedroom    Limitations Walking;Standing   How long can you sit comfortably? NA   How long can you stand comfortably? 10 minutes    How long  can you walk comfortably? 100 yds.   Patient Stated Goals Patient wishes to address balance and strength so she can walk more comfortably. Her husband wishes for her to gain confidence in her abilities to perform functional activities.    Currently in Pain? Yes   Pain Score 4    Pain Location Hip   Pain Orientation Right   Pain Descriptors / Indicators Aching;Sore     Treatment:  NuStep x4 minutes, 1 rest at 2 min. Due to BLE fatigue, L2, BUE/LE, history taken the first 2 minutes (2 min. unbilled) BOSU ball taps, 2x10, no HHA, minA for stability, min VCs to activate glut max Forward lunges, no HHA, 2x12 min VCs for initiating exercise and proper technique, CGA for stability Resisted  with red tband, lateral walking over wooden plank, x4, forwards and backwards Hooklying, core isometrics with pball, 2x10, min VCs to decrease bracing but produce small contraction and hold for 3 seconds. Single leg stance, no HHA, difficult so modified to forward leg on dynadisc, no HHA, 2x30 seconds each, min VCs to produce mild contraction of glut max and core. BLE leg press, 3x10 at 75#, min VCs to decrease knee hyperextension and perform good eccentric control Instructed to ambulate, ~100 feet, with/without SBQC, instructed to use SBQC to decrease R lateral trunk lean, min VCs to continue upright posture with ambulation Tandem stance, 2x30 seconds each, min VCs to contract glut max during stance, CGA for safety, no HHA, increased difficult with RLE back.                            PT Education - 04/08/16 0851    Education provided Yes   Education Details Continuing HEP, balance and muscular strengthening, progressing home bike   Person(s) Educated Patient   Methods Explanation   Comprehension Verbalized understanding             PT Long Term Goals - 04/03/16 0904      PT LONG TERM GOAL #1   Title Patient will improve functional gait assessment to >22/30 in order to decrease fear of falling and improve ease of community ambulation.   Time 4   Period Weeks   Status Partially Met     PT LONG TERM GOAL #2   Title Patient will be independent in HEP in order to increase patient's ability to maintain gains achieved in therapy and assist with return to PLOF.    Time 4   Period Weeks   Status On-going     PT LONG TERM GOAL #3   Title Patient will increase overall strength to 4+/5 in BLE to increase ROM/strength throughout functional activities performed in her ADLs.    Time 4   Period Weeks   Status Partially Met     PT LONG TERM GOAL #4   Title Patient will increase her 10 m walk test time to >1.0 m/s in order to be a community ambulator at decreased risk  for falls.    Time 4   Period Weeks   Status On-going     PT LONG TERM GOAL #5   Title Patient will report a worst pain in right hip of 3/10 in last few days to demonstrate improved tolerance with ADLs.   Time 4   Period Weeks   Status Partially Met               Plan - 04/08/16 0852    Clinical  Impression Statement Patient has continued hip pain 4/10 with a mild increase to 4-5/10 after therapy. She demonstrates a lateral trunk lean to the R, which improves when instructed to use SBQC. She is able to participate in therapy with quick muscular fatigue and poor hip muscule recruitment on the R during static stance with less stability. Patient denies hip pain during all activities.  Patient would continue to benefit from skilled PT in order to address BLE weakness, core strength, dynamic  ans static balance, and functional safety.    Rehab Potential Good   Clinical Impairments Affecting Rehab Potential Postive Factors: Good family support, motivated; Negative Factors: PMH including CA, OA, recent R THA; Clinical Impression: Evolving- falls risk, involved PMH, inner ear problems    PT Frequency 2x / week   PT Duration 4 weeks   PT Treatment/Interventions ADLs/Self Care Home Management;Biofeedback;Cryotherapy;Moist Heat;DME Instruction;Gait training;Stair training;Functional mobility training;Therapeutic activities;Therapeutic exercise;Balance training;Neuromuscular re-education;Patient/family education;Manual techniques;Energy conservation;Vestibular   PT Next Visit Plan build HEP with more balance, dynamic balance, core work-seated pball, single leg stance,, and backwards walking, LE leg press   PT Home Exercise Plan HEP progressed, see patient instructions   Consulted and Agree with Plan of Care Patient;Family member/caregiver      Patient will benefit from skilled therapeutic intervention in order to improve the following deficits and impairments:  Decreased activity tolerance,  Decreased balance, Decreased coordination, Decreased endurance, Decreased mobility, Decreased safety awareness, Decreased strength, Difficulty walking, Hypomobility, Impaired sensation, Impaired UE functional use, Improper body mechanics, Postural dysfunction, Impaired flexibility, Pain  Visit Diagnosis: Unsteadiness on feet  Pain in right hip  Difficulty in walking, not elsewhere classified  Muscle weakness (generalized)     Problem List Patient Active Problem List   Diagnosis Date Noted  . Cancer of overlapping sites of left female breast ([place]) 01/23/2016  . S/P right THA, AA 09/04/2015  . History of artificial joint 09/04/2015  . 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 Malignant neoplasm of female breast ([place]) 07/18/2015  . Neoplasm of meninges ([place]) 07/18/2015  . Breast cancer metastasized to axillary lymph node ([place]) 01/24/2015  . DDD (degenerative disc disease), lumbar 01/02/2015  . HLD (hyperlipidemia) 01/02/2015  . Benign neoplasm of meninges ([place]) 01/02/2015  . Arthritis, degenerative 01/02/2015  . Psoriasis 01/02/2015  . Rotator cuff arthropathy 09/15/2012   Tilman Neat, SPT This entire session was performed under direct supervision and direction of a licensed therapist/therapist assistant . I have personally read, edited and approve of the note as written.  Trotter,Margaret PT, DPT 04/08/2016, 9:15 AM  [place] MAIN [facility] SERVICES [address] [place], [place], [address] Phone: [phone]   Fax:  [phone]  Name: [name] MRN: [id number] Date of Birth: [birth year]

## 2016-04-10 ENCOUNTER — Ambulatory Visit: Payer: Medicare Other | Admitting: Physical Therapy

## 2016-04-10 ENCOUNTER — Encounter: Payer: Self-pay | Admitting: Physical Therapy

## 2016-04-10 DIAGNOSIS — M6281 Muscle weakness (generalized): Secondary | ICD-10-CM

## 2016-04-10 DIAGNOSIS — R2681 Unsteadiness on feet: Secondary | ICD-10-CM | POA: Diagnosis not present

## 2016-04-10 DIAGNOSIS — M25551 Pain in right hip: Secondary | ICD-10-CM

## 2016-04-10 DIAGNOSIS — R262 Difficulty in walking, not elsewhere classified: Secondary | ICD-10-CM

## 2016-04-10 NOTE — Therapy (Signed)
[place] MAIN [facility] SERVICES [address] [place], [place], [address] Phone: [phone]   Fax:  [phone]  Physical Therapy Treatment  Patient Details  Name: [name] MRN: [id number] Date of Birth: 09/07/1940 Referring Provider: Dr. Pryor Ochoa  Encounter Date: 04/10/2016      PT End of Session - 04/10/16 0856    Visit Number 14   Number of Visits 25   Date for PT Re-Evaluation 05/01/16   Authorization Type Gcode 4   Authorization Time Period 10   PT Start Time 0805   PT Stop Time 0845   PT Time Calculation (min) 40 min   Equipment Utilized During Treatment Gait belt   Activity Tolerance Patient tolerated treatment well   Behavior During Therapy [facility] for tasks assessed/performed      Past Medical History:  Diagnosis Date  . Arthritis   . 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 Breast cancer of upper-inner quadrant of left female breast ([place]) 12/15/14   Completed radiation end of December and finished chemotherapy 2 weeks ago, Left breast invasive mammary carcinoma, [name]; Grade 3, [name], [name], [name], .  .Marland KitchenCataract    bilat   . 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 Hearing loss   . History of cancer chemotherapy   . History of radiation therapy   . Imbalance   . Memory impairment    seen by Dr SManuella Ghazi possible post crainiotomy from radiation  . Numbness and tingling    right hand   . Shoulder pain, right   . Wears glasses     Past Surgical History:  Procedure Laterality Date  . APPENDECTOMY  1950  . BRAIN SURGERY  1995   left frontal/temporal  . BREAST BIOPSY Left 1997  . BREAST BIOPSY Left 12/15/14   confirmed DCIS  . BREAST BIOPSY Left 01/08/2015   Procedure: BREAST BIOPSY WITH NEEDLE LOCALIZATION;  Surgeon: [name] MD;  Location: [facility] ORS;  Service: General;  Laterality: Left;  . BREAST LUMPECTOMY Left 01/08/2015   Procedure: LUMPECTOMY;   Surgeon: [name] MD;  Location: [facility] ORS;  Service: General;  Laterality: Left;  . COLONOSCOPY  2010   Dr. ETiffany Kocher . PORTACATH PLACEMENT Right 01/16/2015   Procedure: INSERTION PORT-A-CATH;  Surgeon: [name] MD;  Location: [facility] ORS;  Service: General;  Laterality: Right;  . SENTINEL NODE BIOPSY Left 01/16/2015   Procedure: SENTINEL NODE BIOPSY;  Surgeon: [name] MD;  Location: [facility] ORS;  Service: General;  Laterality: Left;  . TOTAL HIP ARTHROPLASTY Right 09/04/2015   Procedure: RIGHT TOTAL HIP ARTHROPLASTY ANTERIOR APPROACH;  Surgeon: MParalee Cancel MD;  Location: [facility];  Service: Orthopedics;  Laterality: Right;    There were no vitals filed for this visit.      Subjective Assessment - 04/10/16 0808    Subjective Patient states she was not able to do her HEP due to being busy. She notes her pain is mildly increased today. The patient states she'll be getting an elevator in her house this coming week.    Pertinent History Factors Affecting History: PMH of CA, inner ear problems, and recent R THA, supportive husband/family, 19 stairs to go from downstairs to upstairs bedroom    Limitations Walking;Standing   How long can you sit comfortably? NA   How long can you stand comfortably? 10 minutes  How long can you walk comfortably? 100 yds.   Patient Stated Goals Patient wishes to address balance and strength so she can walk more comfortably. Her husband wishes for her to gain confidence in her abilities to perform functional activities.    Currently in Pain? Yes   Pain Score 5    Pain Location Hip   Pain Orientation Right   Pain Descriptors / Indicators Aching;Sore     Treatment:  NuStep at L2, LE only, x2 minutes, history taken throughout  TherEx: Seated core work on pball: LAQ, 3x10 Hip flexion, 3x10 Min VCs to maintain upright posture, tighten core, and decrease posterior lean  BLE leg press, 75#, 3x10, min VCs to decrease knee  hyperextension Forward lunges, 2x10 each LE, min VCs for R knee positioning to decrease knee valgus  Neuromuscular Re-ed: Backwards walking, firm surface, x4 passes outside of // bars, minA for stability, min VCs to contract glut muscles during single leg stance Static tandem stance on airex pad, x10 each LE leading, horizontal ball movements, no HHA, CGA for safety, min VCs for initial positioning Static tandem stance on airex pad, x10 each LE leading, with vertical head movements, no HHA, minA for safety Forward/backward (x4) and lateral walking (x2 no resistance, x4 with yellow tband resistance) on airex balance beam, minA for stability, min VCs for core and glut contraction during stance Forwards walking with vertical head movements, x4 in // bars, min VCs to decrease R trunk lean, minA for stability                            PT Education - 04/10/16 0855    Education provided Yes   Education Details Continuing HEP, increasing resistance on bike at home   Person(s) Educated Patient   Methods Explanation   Comprehension Verbalized understanding             PT Long Term Goals - 04/03/16 0904      PT LONG TERM GOAL #1   Title Patient will improve functional gait assessment to >22/30 in order to decrease fear of falling and improve ease of community ambulation.   Time 4   Period Weeks   Status Partially Met     PT LONG TERM GOAL #2   Title Patient will be independent in HEP in order to increase patient's ability to maintain gains achieved in therapy and assist with return to PLOF.    Time 4   Period Weeks   Status On-going     PT LONG TERM GOAL #3   Title Patient will increase overall strength to 4+/5 in BLE to increase ROM/strength throughout functional activities performed in her ADLs.    Time 4   Period Weeks   Status Partially Met     PT LONG TERM GOAL #4   Title Patient will increase her 10 m walk test time to >1.0 m/s in order to be a  community ambulator at decreased risk for falls.    Time 4   Period Weeks   Status On-going     PT LONG TERM GOAL #5   Title Patient will report a worst pain in right hip of 3/10 in last few days to demonstrate improved tolerance with ADLs.   Time 4   Period Weeks   Status Partially Met               Plan - 04/10/16 0857    Clinical Impression Statement  Patient states she has no increased hip pain throughout therapy session. She does note LE fatigue after BLE strengthening and balance training. During limited ambulation, the patient has mild audible "popping" of RLE. With VCs to use cane and decrease R trunk lean, "popping" dissipates. Patient requires CGA-minA and min VCs for body positioning and correct technique. Patient would continue to benefit from skilled PT in order to address BLE weakness, core strength, dynamic and static balance, and functional safety.    Rehab Potential Good   Clinical Impairments Affecting Rehab Potential Postive Factors: Good family support, motivated; Negative Factors: PMH including CA, OA, recent R THA; Clinical Impression: Evolving- falls risk, involved PMH, inner ear problems    PT Frequency 2x / week   PT Duration 4 weeks   PT Treatment/Interventions ADLs/Self Care Home Management;Biofeedback;Cryotherapy;Moist Heat;DME Instruction;Gait training;Stair training;Functional mobility training;Therapeutic activities;Therapeutic exercise;Balance training;Neuromuscular re-education;Patient/family education;Manual techniques;Energy conservation;Vestibular   PT Next Visit Plan build HEP with more balance, dynamic balance, core work-seated pball, single leg stance,, and backwards walking, LE leg press, resisted hip flexion   PT Home Exercise Plan HEP progressed, see patient instructions   Consulted and Agree with Plan of Care Patient;Family member/caregiver      Patient will benefit from skilled therapeutic intervention in order to improve the following  deficits and impairments:  Decreased activity tolerance, Decreased balance, Decreased coordination, Decreased endurance, Decreased mobility, Decreased safety awareness, Decreased strength, Difficulty walking, Hypomobility, Impaired sensation, Impaired UE functional use, Improper body mechanics, Postural dysfunction, Impaired flexibility, Pain  Visit Diagnosis: Unsteadiness on feet  Pain in right hip  Difficulty in walking, not elsewhere classified  Muscle weakness (generalized)     Problem List Patient Active Problem List   Diagnosis Date Noted  . Cancer of overlapping sites of left female breast ([place]) 01/23/2016  . S/P right THA, AA 09/04/2015  . History of artificial joint 09/04/2015  . 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 Malignant neoplasm of female breast ([place]) 07/18/2015  . Neoplasm of meninges ([place]) 07/18/2015  . Breast cancer metastasized to axillary lymph node ([place]) 01/24/2015  . DDD (degenerative disc disease), lumbar 01/02/2015  . HLD (hyperlipidemia) 01/02/2015  . Benign neoplasm of meninges ([place]) 01/02/2015  . Arthritis, degenerative 01/02/2015  . Psoriasis 01/02/2015  . Rotator cuff arthropathy 09/15/2012   Tilman Neat, SPT This entire session was performed under direct supervision and direction of a licensed therapist/therapist assistant . I have personally read, edited and approve of the note as written.  Trotter,Margaret PT, DPT 04/10/2016, 12:58 PM  [place] MAIN [facility] SERVICES [address] [place], [place], [address] Phone: [phone]   Fax:  [phone]  Name: RISSA TURLEY MRN: [id number] Date of Birth: [birth year]

## 2016-04-15 ENCOUNTER — Encounter: Payer: Self-pay | Admitting: Physical Therapy

## 2016-04-15 ENCOUNTER — Ambulatory Visit: Payer: Medicare Other | Admitting: Physical Therapy

## 2016-04-15 DIAGNOSIS — R262 Difficulty in walking, not elsewhere classified: Secondary | ICD-10-CM

## 2016-04-15 DIAGNOSIS — R2681 Unsteadiness on feet: Secondary | ICD-10-CM

## 2016-04-15 DIAGNOSIS — M25551 Pain in right hip: Secondary | ICD-10-CM

## 2016-04-15 DIAGNOSIS — M6281 Muscle weakness (generalized): Secondary | ICD-10-CM

## 2016-04-15 NOTE — Therapy (Signed)
[place] MAIN [facility] SERVICES [address] [place], [place], [address] Phone: [phone]   Fax:  [phone]  Physical Therapy Treatment  Patient Details  Name: [name] MRN: [id number] Date of Birth: 16-Feb-1941 Referring Provider: Dr. Pryor Ochoa  Encounter Date: 04/15/2016      PT End of Session - 04/15/16 0852    Visit Number 15   Number of Visits 25   Date for PT Re-Evaluation 05/01/16   Authorization Type Gcode 5   Authorization Time Period 10   PT Start Time 0805   PT Stop Time 0845   PT Time Calculation (min) 40 min   Equipment Utilized During Treatment Gait belt   Activity Tolerance Patient tolerated treatment well   Behavior During Therapy [facility] for tasks assessed/performed      Past Medical History:  Diagnosis Date  . Arthritis   . 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 Breast cancer of upper-inner quadrant of left female breast ([place]) 12/15/14   Completed radiation end of December and finished chemotherapy 2 weeks ago, Left breast invasive mammary carcinoma, T1cN39mc (1.5 cm); Grade 3, [name], [name], [name], .  .Marland KitchenCataract    bilat   . 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 Hearing loss   . History of cancer chemotherapy   . History of radiation therapy   . Imbalance   . Memory impairment    seen by Dr SManuella Ghazi possible post crainiotomy from radiation  . Numbness and tingling    right hand   . Shoulder pain, right   . Wears glasses     Past Surgical History:  Procedure Laterality Date  . APPENDECTOMY  1950  . BRAIN SURGERY  1995   left frontal/temporal  . BREAST BIOPSY Left 1997  . BREAST BIOPSY Left 12/15/14   confirmed DCIS  . BREAST BIOPSY Left 01/08/2015   Procedure: BREAST BIOPSY WITH NEEDLE LOCALIZATION;  Surgeon: [name] MD;  Location: [facility] ORS;  Service: General;  Laterality: Left;  . BREAST LUMPECTOMY Left 01/08/2015   Procedure: LUMPECTOMY;   Surgeon: [name] MD;  Location: [facility];  Service: General;  Laterality: Left;  . COLONOSCOPY  2010   Dr. ETiffany Kocher . PORTACATH PLACEMENT Right 01/16/2015   Procedure: INSERTION PORT-A-CATH;  Surgeon: [name] MD;  Location: [facility] OR[facility];  Service: General;  Laterality: Right;  . SENTINEL NODE BIOPSY Left 01/16/2015   Procedure: SENTINEL NODE BIOPSY;  Surgeon: [name] MD;  Location: [facility];  Service: General;  Laterality: Left;  . TOTAL HIP ARTHROPLASTY Right 09/04/2015   Procedure: RIGHT TOTAL HIP ARTHROPLASTY ANTERIOR APPROACH;  Surgeon: MParalee Cancel MD;  Location: [facility];  Service: Orthopedics;  Laterality: Right;    There were no vitals filed for this visit.      Subjective Assessment - 04/15/16 0807    Subjective Patient states she was able to do the walking exercises and biking but not the other HEP exercises. She notes she continues to have R hip popping with movement. The patient states  she has gotten an e[employer] plannerin her house.    Pertinent History Factors Affecting History: PMH of CA, inner ear problems, and recent R THA, supportive husband/family, 19 stairs to go from downstairs to upstairs bedroom    Limitations Walking;Standing   How long can you sit comfortably? NA   How long can  you stand comfortably? 10 minutes    How long can you walk comfortably? 100 yds.   Patient Stated Goals Patient wishes to address balance and strength so she can walk more comfortably. Her husband wishes for her to gain confidence in her abilities to perform functional activities.    Currently in Pain? Yes   Pain Score 5    Pain Location Hip   Pain Orientation Right   Pain Descriptors / Indicators Constant   Pain Type Chronic pain      Treatment:  Warm up on NuStep, x3 minutes BUE/LE, 2 min of history, 1 min unbilled  Neuromuscular Re-ed: BOSU ball lateral step ups, x5 each LE leading, modA for stability, no HHA Forward walking on red mat, x2 with horizontal (x2)  and vertical (x2) movements, no AD, min-modA for stability, no HHA, min VCs to decrease R lateral trunk lean.  Leading foot on dynadisc, x30 second each LE leading, increased to BUE shoulder flexion x10 each LE leading, instructed patient to decrease RUE range and stop RUE movement due to previous shoulder issues. Forward tapping on 4" box while standing on airex pad, 2x20, min VCs to contract glut max during single leg stance Single leg stance, 4x3-10 sec. No HHA on LLE, 4x1-5 seconds 0-1 HHA, min VCs to contract glut muscles during single leg stance, modA to maintain/regain stability.  Gait ambulation, SBQC, ~90 feet, min VCs to decrease R lateral trunk lean.   TherEx: BOSU ball forward lunges, 2x10, standing rest break in between, min VCs to perform knee flexion with BLE, no HHA, minA for stability Alternating BLE hip extension in qped supported by pball at stomach, 2x10 each LE, minA to stability ball, increased WB on LUE, patient denies pain during or after activity.  Sit to stands with hip extension focus, 2x10, at chair height or slightly lower, min VCs to contract B glut max during stance, CGA for safety, no HHA.                            PT Education - 04/15/16 0850    Education provided Yes   Education Details continuing HEP in order to see it, glut max initiation   Person(s) Educated Patient;Spouse   Methods Explanation   Comprehension Verbalized understanding             PT Long Term Goals - 04/03/16 0904      PT LONG TERM GOAL #1   Title Patient will improve functional gait assessment to >22/30 in order to decrease fear of falling and improve ease of community ambulation.   Time 4   Period Weeks   Status Partially Met     PT LONG TERM GOAL #2   Title Patient will be independent in HEP in order to increase patient's ability to maintain gains achieved in therapy and assist with return to PLOF.    Time 4   Period Weeks   Status On-going     PT  LONG TERM GOAL #3   Title Patient will increase overall strength to 4+/5 in BLE to increase ROM/strength throughout functional activities performed in her ADLs.    Time 4   Period Weeks   Status Partially Met     PT LONG TERM GOAL #4   Title Patient will increase her 10 m walk test time to >1.0 m/s in order to be a community ambulator at decreased risk for falls.    Time 4  Period Weeks   Status On-going     PT LONG TERM GOAL #5   Title Patient will report a worst pain in right hip of 3/10 in last few days to demonstrate improved tolerance with ADLs.   Time 4   Period Weeks   Status Partially Met               Plan - 04/15/16 0853    Clinical Impression Statement Pt has continual hip pain in her R hip that mildly improves during therapy session. She continues to note LE fatigue after BLE strengthening and balance training but notes that it is not pain. She is able to perform balance and BLE strengthening activities with min-mod VCs and min-modA for stability.  Pt continues to demonstrate poor glut max/med initiation on RLE with an inability to hold SLS without UE support.  Patient would continue to benefit from skilled PT in order to address BLE weakness, dynamic and static balance, and functional safety.    Rehab Potential Good   Clinical Impairments Affecting Rehab Potential Postive Factors: Good family support, motivated; Negative Factors: PMH including CA, OA, recent R THA; Clinical Impression: Evolving- falls risk, involved PMH, inner ear problems    PT Frequency 2x / week   PT Duration 4 weeks   PT Treatment/Interventions ADLs/Self Care Home Management;Biofeedback;Cryotherapy;Moist Heat;DME Instruction;Gait training;Stair training;Functional mobility training;Therapeutic activities;Therapeutic exercise;Balance training;Neuromuscular re-education;Patient/family education;Manual techniques;Energy conservation;Vestibular   PT Next Visit Plan build HEP with more balance, dynamic  balance, glut max activating exercises, single leg stance,, and backwards walking, LE leg press, resisted hip flexion   PT Home Exercise Plan HEP progressed, see patient instructions   Consulted and Agree with Plan of Care Patient;Family member/caregiver      Patient will benefit from skilled therapeutic intervention in order to improve the following deficits and impairments:  Decreased activity tolerance, Decreased balance, Decreased coordination, Decreased endurance, Decreased mobility, Decreased safety awareness, Decreased strength, Difficulty walking, Hypomobility, Impaired sensation, Impaired UE functional use, Improper body mechanics, Postural dysfunction, Impaired flexibility, Pain  Visit Diagnosis: Unsteadiness on feet  Pain in right hip  Difficulty in walking, not elsewhere classified  Muscle weakness (generalized)     Problem List Patient Active Problem List   Diagnosis Date Noted  . Cancer of overlapping sites of left female breast ([place]) 01/23/2016  . S/P right THA, AA 09/04/2015  . History of artificial joint 09/04/2015  . 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 Malignant neoplasm of female breast ([place]) 07/18/2015  . Neoplasm of meninges ([place]) 07/18/2015  . Breast cancer metastasized to axillary lymph node ([place]) 01/24/2015  . DDD (degenerative disc disease), lumbar 01/02/2015  . HLD (hyperlipidemia) 01/02/2015  . Benign neoplasm of meninges ([place]) 01/02/2015  . Arthritis, degenerative 01/02/2015  . Psoriasis 01/02/2015  . Rotator cuff arthropathy 09/15/2012   Tilman Neat, SPT This entire session was performed under direct supervision and direction of a licensed therapist/therapist assistant . I have personally read, edited and approve of the note as written.   Trotter,Margaret PT, DPT 04/15/2016, 3:16 PM  [place] MAIN [facility] SERVICES [address] [place], [place], [address] Phone: [phone]   Fax:   [phone]  Name: [name] MRN: [id number] Date of Birth: June 16, 1941

## 2016-04-18 ENCOUNTER — Ambulatory Visit: Payer: Medicare Other | Admitting: Physical Therapy

## 2016-04-18 ENCOUNTER — Encounter: Payer: Self-pay | Admitting: Physical Therapy

## 2016-04-18 DIAGNOSIS — R2681 Unsteadiness on feet: Secondary | ICD-10-CM

## 2016-04-18 DIAGNOSIS — M6281 Muscle weakness (generalized): Secondary | ICD-10-CM

## 2016-04-18 DIAGNOSIS — R262 Difficulty in walking, not elsewhere classified: Secondary | ICD-10-CM

## 2016-04-18 DIAGNOSIS — M25551 Pain in right hip: Secondary | ICD-10-CM

## 2016-04-18 NOTE — Therapy (Signed)
[place] MAIN [facility] SERVICES [address] [place], [place], [address] Phone: [phone]   Fax:  [phone]  Physical Therapy Treatment  Patient Details  Name: [name] MRN: [id number] Date of Birth: 1941-07-23 Referring Provider: Dr. Pryor Ochoa  Encounter Date: 04/18/2016      PT End of Session - 04/18/16 0958    Visit Number 16   Number of Visits 25   Date for PT Re-Evaluation 05/01/16   Authorization Type Gcode 6   Authorization Time Period 10   PT Start Time 0848   PT Stop Time 0931   PT Time Calculation (min) 43 min   Equipment Utilized During Treatment Gait belt   Activity Tolerance Patient tolerated treatment well   Behavior During Therapy [facility] for tasks assessed/performed      Past Medical History:  Diagnosis Date  . Arthritis   . 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 Breast cancer of upper-inner quadrant of left female breast ([place]) 12/15/14   Completed radiation end of December and finished chemotherapy 2 weeks ago, Left breast invasive mammary carcinoma, T1cN3mc (1.5 cm); Grade 3, [name], [name], [name], .  .Marland KitchenCataract    bilat   . 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 Hearing loss   . History of cancer chemotherapy   . History of radiation therapy   . Imbalance   . Memory impairment    seen by Dr SManuella Ghazi possible post crainiotomy from radiation  . Numbness and tingling    right hand   . Shoulder pain, right   . Wears glasses     Past Surgical History:  Procedure Laterality Date  . APPENDECTOMY  1950  . BRAIN SURGERY  1995   left frontal/temporal  . BREAST BIOPSY Left 1997  . BREAST BIOPSY Left 12/15/14   confirmed DCIS  . BREAST BIOPSY Left 01/08/2015   Procedure: BREAST BIOPSY WITH NEEDLE LOCALIZATION;  Surgeon: [name] MD;  Location: [facility] ORS;  Service: General;  Laterality: Left;  . BREAST LUMPECTOMY Left 01/08/2015   Procedure: LUMPECTOMY;   Surgeon: [name] MD;  Location: [facility] ORS;  Service: General;  Laterality: Left;  . COLONOSCOPY  2010   Dr. ETiffany Kocher . PORTACATH PLACEMENT Right 01/16/2015   Procedure: INSERTION PORT-A-CATH;  Surgeon: [name] MD;  Location: [facility] ORS;  Service: General;  Laterality: Right;  . SENTINEL NODE BIOPSY Left 01/16/2015   Procedure: SENTINEL NODE BIOPSY;  Surgeon: [name] MD;  Location: [facility] ORS;  Service: General;  Laterality: Left;  . TOTAL HIP ARTHROPLASTY Right 09/04/2015   Procedure: RIGHT TOTAL HIP ARTHROPLASTY ANTERIOR APPROACH;  Surgeon: MParalee Cancel MD;  Location: [facility];  Service: Orthopedics;  Laterality: Right;    There were no vitals filed for this visit.      Subjective Assessment - 04/18/16 0851    Subjective Patient states she saw Dr. SManuella Ghazi, which she states he said everything looked like it was doing well. She can't remember anymore details than that. Pt states she has been able to do some of her HEP but not all of it.    Pertinent History Factors Affecting History: PMH of CA, inner ear problems, and recent R THA, supportive husband/family, 19 stairs to go from downstairs to upstairs bedroom    Limitations Walking;Standing   How long can you sit comfortably? NA   How  long can you stand comfortably? 10 minutes    How long can you walk comfortably? 100 yds.   Patient Stated Goals Patient wishes to address balance and strength so she can walk more comfortably. Her husband wishes for her to gain confidence in her abilities to perform functional activities.    Currently in Pain? Yes   Pain Score 5    Pain Location Hip   Pain Orientation Right   Pain Descriptors / Indicators Constant;Sore      Treatment: NuStep, L2 x4 minutes, 3 min. Of history, 1 min. Unbilled   TherEx: Standing hip extension against cable (2.5#), RLE 4x5, LLE 3x5, decreased # of reps to maintain quality, min VCs to maintain upright position and activate glut max before initiation  due to poor motor control.         In between reps patient performed standing glut sets 2x15 BOSU ball forward lunges, 2x10 each LE, 0-1 HHA, CGA-minA for stability, rest in between sets, min VCs to push with forward leg off BOSU to strengthen glut Wall squats, 3x10, min VCs for initial set up especially foot placement, instructed to replace mini squats with wall squats in HEP (handout given)  Neuro Re-ed: Airex balance beam forward/backwards walking x3, minA-modA for stability, 0 HHA; lateral walking with yellow tband resistance (x2) increased to performing with vertical head movements (x2), minA-modA for stability, 0 HHA. Mini trampoline: 1 HHA to get on/off, 0 HHA during activities, CGA-minA throughout for safety Lateral taps in/out, 2x10, min VCs for correct initiation of activity Marches, 2x10, min VCs for contraction of hip muscles during SLS Static stand; feet apart with horizontal head movements x10, feet together with horizontal head movements x10; min VCs for initiation of exercise.  Instructed to ambulate ~100 feet, close supervision with [facility] continued decreased Trendelenburg, mild audible hip popping, instructed to continue to use SBQC to decrease stress placed on R hip.                            PT Education - 04/18/16 [phone]    Education provided Yes   Education Details substituing wall squats from open squats in HEP, glut max activation   Person(s) Educated Patient   Methods Explanation;Handout;Verbal cues;Demonstration   Comprehension Verbalized understanding;Verbal cues required;Returned demonstration             PT Long Term Goals - 04/03/16 0904      PT LONG TERM GOAL #1   Title Patient will improve functional gait assessment to >22/30 in order to decrease fear of falling and improve ease of community ambulation.   Time 4   Period Weeks   Status Partially Met     PT LONG TERM GOAL #2   Title Patient will be independent in HEP in order to  increase patient's ability to maintain gains achieved in therapy and assist with return to PLOF.    Time 4   Period Weeks   Status On-going     PT LONG TERM GOAL #3   Title Patient will increase overall strength to 4+/5 in BLE to increase ROM/strength throughout functional activities performed in her ADLs.    Time 4   Period Weeks   Status Partially Met     PT LONG TERM GOAL #4   Title Patient will increase her 10 m walk test time to >1.0 m/s in order to be a community ambulator at decreased risk for falls.    Time  4   Period Weeks   Status On-going     PT LONG TERM GOAL #5   Title Patient will report a worst pain in right hip of 3/10 in last few days to demonstrate improved tolerance with ADLs.   Time 4   Period Weeks   Status Partially Met               Plan - 04/18/16 0959    Clinical Impression Statement Upon ambulation with [facility] into PT session, pt had decreased R Trendelenburg and appeared more steady. Pt has improved BLE control during strengthening exercises. She does continue to require min VCs for activation of glut max during SLS activities. She has improved understanding and self initiation today. Pt continues to require min-modA during balance activiaties for safety.    Rehab Potential Good   Clinical Impairments Affecting Rehab Potential Postive Factors: Good family support, motivated; Negative Factors: PMH including CA, OA, recent R THA; Clinical Impression: Evolving- falls risk, involved PMH, inner ear problems    PT Frequency 2x / week   PT Duration 4 weeks   PT Treatment/Interventions ADLs/Self Care Home Management;Biofeedback;Cryotherapy;Moist Heat;DME Instruction;Gait training;Stair training;Functional mobility training;Therapeutic activities;Therapeutic exercise;Balance training;Neuromuscular re-education;Patient/family education;Manual techniques;Energy conservation;Vestibular   PT Next Visit Plan build HEP with more balance, dynamic balance, glut max  activating exercises, single leg stance,, and backwards walking, LE leg press, resisted hip flexion   PT Home Exercise Plan HEP progressed, see patient instructions   Consulted and Agree with Plan of Care Patient;Family member/caregiver      Patient will benefit from skilled therapeutic intervention in order to improve the following deficits and impairments:  Decreased activity tolerance, Decreased balance, Decreased coordination, Decreased endurance, Decreased mobility, Decreased safety awareness, Decreased strength, Difficulty walking, Hypomobility, Impaired sensation, Impaired UE functional use, Improper body mechanics, Postural dysfunction, Impaired flexibility, Pain  Visit Diagnosis: Unsteadiness on feet  Pain in right hip  Difficulty in walking, not elsewhere classified  Muscle weakness (generalized)     Problem List Patient Active Problem List   Diagnosis Date Noted  . Cancer of overlapping sites of left female breast ([place]) 01/23/2016  . S/P right THA, AA 09/04/2015  . History of artificial joint 09/04/2015  . 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 Malignant neoplasm of female breast ([place]) 07/18/2015  . Neoplasm of meninges ([place]) 07/18/2015  . Breast cancer metastasized to axillary lymph node ([place]) 01/24/2015  . DDD (degenerative disc disease), lumbar 01/02/2015  . HLD (hyperlipidemia) 01/02/2015  . Benign neoplasm of meninges ([place]) 01/02/2015  . Arthritis, degenerative 01/02/2015  . Psoriasis 01/02/2015  . Rotator cuff arthropathy 09/15/2012   Tilman Neat, SPT This entire session was performed under direct supervision and direction of a licensed therapist/therapist assistant . I have personally read, edited and approve of the note as written.  Trotter,Margaret PT, DPT 04/18/2016, 11:35 AM  [place] MAIN [facility] SERVICES [address] [place], [place], [address] Phone: [phone]   Fax:   [phone]  Name: KARLISA GAUBERT MRN: [id number] Date of Birth: [birth year]

## 2016-04-18 NOTE — Patient Instructions (Addendum)
  Therapeutic - Strengthening Knees - Wall Slide    With back pressed against wall, slide down until knees are bent. Make sure feet are shoulder width apart far enough out from the wall that your knees DO NOT cross your toes when you bend your knees. Hold __0-1__ seconds. Repeat __10__ times. If begins to be painful, discontinue and notify physical therapy next session    Copyright  VHI. All rights reserved.

## 2016-04-21 ENCOUNTER — Encounter: Payer: Self-pay | Admitting: Physical Therapy

## 2016-04-21 ENCOUNTER — Ambulatory Visit: Payer: Medicare Other | Admitting: Physical Therapy

## 2016-04-21 DIAGNOSIS — R2681 Unsteadiness on feet: Secondary | ICD-10-CM

## 2016-04-21 DIAGNOSIS — M25551 Pain in right hip: Secondary | ICD-10-CM

## 2016-04-21 DIAGNOSIS — M6281 Muscle weakness (generalized): Secondary | ICD-10-CM

## 2016-04-21 DIAGNOSIS — R262 Difficulty in walking, not elsewhere classified: Secondary | ICD-10-CM

## 2016-04-21 NOTE — Therapy (Signed)
[place] MAIN [facility] SERVICES [address] [place], [place], [address] Phone: [phone]   Fax:  [phone]  Physical Therapy Treatment  Patient Details  Name: [name] MRN: [id number] Date of Birth: [birth year] Referring Provider: Dr. Pryor Ochoa  Encounter Date: 04/21/2016      PT End of Session - 04/21/16 1122    Visit Number 17   Number of Visits 25   Date for PT Re-Evaluation 05/01/16   Authorization Type Gcode 7   Authorization Time Period 10   PT Start Time 0845   PT Stop Time 0928   PT Time Calculation (min) 43 min   Equipment Utilized During Treatment Gait belt   Activity Tolerance Patient tolerated treatment well   Behavior During Therapy [facility] for tasks assessed/performed      Past Medical History:  Diagnosis Date  . Arthritis   . 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  .Marland KitchenCataract    bilat   . 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 Hearing loss   . History of cancer chemotherapy   . History of radiation therapy   . Imbalance   . Memory impairment    seen by Dr SManuella Ghazi possible post crainiotomy from radiation  . Numbness and tingling    right hand   . Shoulder pain, right   . Wears glasses     Past Surgical History:  Procedure Laterality Date  . APPENDECTOMY  1950  . BRAIN SURGERY  1995   left frontal/temporal  . BREAST BIOPSY Left 1997  . BREAST BIOPSY Left 12/15/14   confirmed DCIS  . BREAST BIOPSY Left 01/08/2015   Procedure: BREAST BIOPSY WITH NEEDLE LOCALIZATION;  Surgeon: [name] MD;  Location: [facility];  Service: General;  Laterality: Left;  . BREAST LUMPECTOMY Left 01/08/2015   Procedure: LUMPECTOMY;   Surgeon: [name] MD;  Location: [facility];  Service: General;  Laterality: Left;  . COLONOSCOPY  2010   Dr. ETiffany Kocher . PORTACATH PLACEMENT Right 01/16/2015   Procedure: INSERTION PORT-A-CATH;  Surgeon: [name] MD;  Location: [facility];  Service: General;  Laterality: Right;  . SENTINEL NODE BIOPSY Left 01/16/2015   Procedure: SENTINEL NODE BIOPSY;  Surgeon: [name] MD;  Location: [facility];  Service: General;  Laterality: Left;  . TOTAL HIP ARTHROPLASTY Right 09/04/2015   Procedure: RIGHT TOTAL HIP ARTHROPLASTY ANTERIOR APPROACH;  Surgeon: MParalee Cancel MD;  Location: [facility];  Service: Orthopedics;  Laterality: Right;    There were no vitals filed for this visit.      Subjective Assessment - 04/21/16 0847    Subjective Pt states she is doing well today and her hip pain is feeling a little better today. She notes that the elevator has been helpful for her and that she feels more steady.    Pertinent History Factors Affecting History: PMH of CA, inner ear problems, and recent R THA, supportive husband/family, 19 stairs to go from downstairs to upstairs bedroom    Limitations Walking;Standing   How long can you sit comfortably? NA   How long can you stand comfortably? 10 minutes  How long can you walk comfortably? 100 yds.   Patient Stated Goals Patient wishes to address balance and strength so she can walk more comfortably. Her husband wishes for her to gain confidence in her abilities to perform functional activities.    Currently in Pain? Yes   Pain Score 4    Pain Location Hip   Pain Orientation Right   Pain Descriptors / Indicators Constant      Treatment:  Resisted cable hip extension (3x10) and abduction (x10), reps done with each LE, 2.5#, min VCs for glut max activation and set up with hip ABD. Wall squats, 3x10, no HHA, CGA-Supervision, min VCs for placement of feet initially  Forward walking with SBQC, x4 in hallways,  with horizontal (x2) and  vertical (x2) head turns, min VCs to ambulate at an increased speed for stability, CGA for stability during head movements.  Lateral BOSU step ups x5 each LE leading; static holds on BOSU ball x30 seconds x10, min-modA for stability, no HHA, 0-2 HHA to move onto BOSU ball Forward BOSU taps while standing on airex pad, 2x20, no HHA, CGA for safety, min VCs to contract glut max for R hip stability during SLS. Single leg stance for 8 seconds, 2x10 each LE with 1 HHA LLE, 2 HHA RLE, mild increase in RLE pain, d/c due to pain, pain subsided after sitting and performing NWB exercises following. Hooklying core isometrics with pball, x10, min VCs to perform gently with no bracing, pt denies UE/LE pain.                            PT Education - 04/21/16 1122    Education provided Yes   Education Details continue with HEP especially on vacation   Person(s) Educated Patient   Methods Explanation   Comprehension Verbalized understanding             PT Long Term Goals - 04/03/16 0904      PT LONG TERM GOAL #1   Title Patient will improve functional gait assessment to >22/30 in order to decrease fear of falling and improve ease of community ambulation.   Time 4   Period Weeks   Status Partially Met     PT LONG TERM GOAL #2   Title Patient will be independent in HEP in order to increase patient's ability to maintain gains achieved in therapy and assist with return to PLOF.    Time 4   Period Weeks   Status On-going     PT LONG TERM GOAL #3   Title Patient will increase overall strength to 4+/5 in BLE to increase ROM/strength throughout functional activities performed in her ADLs.    Time 4   Period Weeks   Status Partially Met     PT LONG TERM GOAL #4   Title Patient will increase her 10 m walk test time to >1.0 m/s in order to be a community ambulator at decreased risk for falls.    Time 4   Period Weeks   Status On-going     PT LONG TERM GOAL #5   Title  Patient will report a worst pain in right hip of 3/10 in last few days to demonstrate improved tolerance with ADLs.   Time 4   Period Weeks   Status Partially Met               Plan - 04/21/16 1122    Clinical Impression Statement  Pt able to participate in PT with mild increase in R hip pain during SLS that subsided with d/c of that exercise. She continues to have improved glut max activation with min VCs with increased difficulty during hip ABD. Pt requires min-modA during balance activities especially with static stance on BOSU ball. Pt's goals will be re-assessed next visit, and she would continue to benefit from skilled PT in order to address BLE weakness, balance, and safe functional movements.    Rehab Potential Good   Clinical Impairments Affecting Rehab Potential Postive Factors: Good family support, motivated; Negative Factors: PMH including CA, OA, recent R THA; Clinical Impression: Evolving- falls risk, involved PMH, inner ear problems    PT Frequency 2x / week   PT Duration 4 weeks   PT Treatment/Interventions ADLs/Self Care Home Management;Biofeedback;Cryotherapy;Moist Heat;DME Instruction;Gait training;Stair training;Functional mobility training;Therapeutic activities;Therapeutic exercise;Balance training;Neuromuscular re-education;Patient/family education;Manual techniques;Energy conservation;Vestibular   PT Next Visit Plan build HEP with more balance, dynamic balance, glut max activating exercises, single leg stance,, and backwards walking, LE leg press, resisted hip flexion   PT Home Exercise Plan HEP maintained, see patient instructions   Consulted and Agree with Plan of Care Patient;Family member/caregiver      Patient will benefit from skilled therapeutic intervention in order to improve the following deficits and impairments:  Decreased activity tolerance, Decreased balance, Decreased coordination, Decreased endurance, Decreased mobility, Decreased safety awareness,  Decreased strength, Difficulty walking, Hypomobility, Impaired sensation, Impaired UE functional use, Improper body mechanics, Postural dysfunction, Impaired flexibility, Pain  Visit Diagnosis: Unsteadiness on feet  Pain in right hip  Difficulty in walking, not elsewhere classified  Muscle weakness (generalized)     Problem List Patient Active Problem List   Diagnosis Date Noted  . Cancer of overlapping sites of left female breast ([place]) 01/23/2016  . S/P right THA, AA 09/04/2015  . History of artificial joint 09/04/2015  . 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 Malignant neoplasm of female breast ([place]) 07/18/2015  . Neoplasm of meninges ([place]) 07/18/2015  . Breast cancer metastasized to axillary lymph node ([place]) 01/24/2015  . DDD (degenerative disc disease), lumbar 01/02/2015  . HLD (hyperlipidemia) 01/02/2015  . Benign neoplasm of meninges ([place]) 01/02/2015  . Arthritis, degenerative 01/02/2015  . Psoriasis 01/02/2015  . Rotator cuff arthropathy 09/15/2012   Tilman Neat, SPT This entire session was performed under direct supervision and direction of a licensed therapist/therapist assistant . I have personally read, edited and approve of the note as written.  Trotter,Margaret PT, DPT 04/21/2016, 4:34 PM  [place] MAIN [facility] SERVICES [address] [place], [place], [address] Phone: [phone]   Fax:  [phone]  Name: [name] MRN: [id number] Date of Birth: 12/08/1940

## 2016-04-24 ENCOUNTER — Ambulatory Visit: Payer: Medicare Other | Admitting: Physical Therapy

## 2016-04-29 ENCOUNTER — Ambulatory Visit: Payer: Medicare Other | Admitting: Physical Therapy

## 2016-05-01 ENCOUNTER — Encounter: Payer: Self-pay | Admitting: Physical Therapy

## 2016-05-01 ENCOUNTER — Ambulatory Visit: Payer: Medicare Other | Attending: Otolaryngology | Admitting: Physical Therapy

## 2016-05-01 DIAGNOSIS — R262 Difficulty in walking, not elsewhere classified: Secondary | ICD-10-CM | POA: Diagnosis present

## 2016-05-01 DIAGNOSIS — M6281 Muscle weakness (generalized): Secondary | ICD-10-CM | POA: Diagnosis present

## 2016-05-01 DIAGNOSIS — R2681 Unsteadiness on feet: Secondary | ICD-10-CM

## 2016-05-01 DIAGNOSIS — M25551 Pain in right hip: Secondary | ICD-10-CM | POA: Diagnosis present

## 2016-05-01 NOTE — Therapy (Signed)
[place] MAIN [facility] SERVICES [address] [place], [place], [address] Phone: [phone]   Fax:  [phone]  Physical Therapy Treatment/Progress Note  Patient Details  Name: [name] MRN: [id number] Date of Birth: March 13, 1941 Referring Provider: Dr. Pryor Ochoa  Encounter Date: 05/01/2016      PT End of Session - 05/01/16 0928    Visit Number 18   Number of Visits 29   Date for PT Re-Evaluation 05/29/16   Authorization Type Gcode 8   Authorization Time Period 10   PT Start Time 0850   PT Stop Time 0928   PT Time Calculation (min) 38 min   Equipment Utilized During Treatment Gait belt   Activity Tolerance Patient tolerated treatment well   Behavior During Therapy [facility] for tasks assessed/performed      Past Medical History:  Diagnosis Date  . Arthritis   . 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  .Marland KitchenCataract    bilat   . 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 Hearing loss   . History of cancer chemotherapy   . History of radiation therapy   . Imbalance   . Memory impairment    seen by Dr SManuella Ghazi possible post crainiotomy from radiation  . Numbness and tingling    right hand   . Shoulder pain, right   . Wears glasses     Past Surgical History:  Procedure Laterality Date  . APPENDECTOMY  1950  . BRAIN SURGERY  1995   left frontal/temporal  . BREAST BIOPSY Left 1997  . BREAST BIOPSY Left 12/15/14   confirmed DCIS  . BREAST BIOPSY Left 01/08/2015   Procedure: BREAST BIOPSY WITH NEEDLE LOCALIZATION;  Surgeon: [name] MD;  Location: [facility] ORS;  Service: General;  Laterality: Left;  . BREAST LUMPECTOMY Left 01/08/2015    Procedure: LUMPECTOMY;  Surgeon: [name] MD;  Location: [facility] ORS;  Service: General;  Laterality: Left;  . COLONOSCOPY  2010   Dr. ETiffany Kocher . PORTACATH PLACEMENT Right 01/16/2015   Procedure: INSERTION PORT-A-CATH;  Surgeon: [name] MD;  Location: [facility] ORS;  Service: General;  Laterality: Right;  . SENTINEL NODE BIOPSY Left 01/16/2015   Procedure: SENTINEL NODE BIOPSY;  Surgeon: [name] MD;  Location: [facility] ORS;  Service: General;  Laterality: Left;  . TOTAL HIP ARTHROPLASTY Right 09/04/2015   Procedure: RIGHT TOTAL HIP ARTHROPLASTY ANTERIOR APPROACH;  Surgeon: MParalee Cancel MD;  Location: [facility];  Service: Orthopedics;  Laterality: Right;    There were no vitals filed for this visit.      Subjective Assessment - 05/01/16 0852    Subjective Pt states she was able to do some of her exercises while she was away but "not as much as she should". She states she feels safer when she walks around.    Pertinent History Factors Affecting History: PMH of CA, inner ear problems, and recent R THA, supportive husband/family, 19 stairs to go from downstairs to upstairs bedroom    Limitations Walking;Standing   How long can you sit comfortably? NA   How long can you stand comfortably? 10 minutes    How  long can you walk comfortably? 100 yds.   Patient Stated Goals Patient wishes to address balance and strength so she can walk more comfortably. Her husband wishes for her to gain confidence in her abilities to perform functional activities.    Currently in Pain? Yes   Pain Score 4    Pain Location Hip   Pain Orientation Right   Pain Descriptors / Indicators Constant            [facility] PT Assessment - 05/01/16 0001      Strength   Right Shoulder Flexion 4+/5   Right Shoulder Extension 4+/5   Right Shoulder ABduction 4+/5   Left Shoulder Flexion 4+/5   Left Shoulder Extension 4+/5   Left Shoulder ABduction 3-/5   Right Elbow Flexion 5/5   Right Elbow Extension 5/5    Left Elbow Flexion 5/5   Left Elbow Extension 5/5   Right Hip Flexion 4+/5   Right Hip ABduction 4+/5   Left Hip Flexion 4+/5   Left Hip ABduction 4+/5   Right Knee Flexion 4+/5   Right Knee Extension 4+/5   Left Knee Flexion 4+/5   Left Knee Extension 4+/5   Right Ankle Dorsiflexion 5/5   Left Ankle Dorsiflexion 5/5     Standardized Balance Assessment   10 Meter Walk 0.71m/s, supervison, SBQC, decreased from 0.74 m/s     Functional Gait  Assessment   Gait Level Surface Walks 20 ft in less than 7 sec but greater than 5.5 sec, uses assistive device, slower speed, mild gait deviations, or deviates 6-10 in outside of the 12 in walkway width.   Change in Gait Speed Able to smoothly change walking speed without loss of balance or gait deviation. Deviate no more than 6 in outside of the 12 in walkway width.   Gait with Horizontal Head Turns Performs head turns smoothly with no change in gait. Deviates no more than 6 in outside 12 in walkway width   Gait with Vertical Head Turns Performs task with slight change in gait velocity (eg, minor disruption to smooth gait path), deviates 6 - 10 in outside 12 in walkway width or uses assistive device   Gait and Pivot Turn Pivot turns safely within 3 sec and stops quickly with no loss of balance.   Step Over Obstacle Is able to step over one shoe box (4.5 in total height) without changing gait speed. No evidence of imbalance.   Gait with Narrow Base of Support Ambulates 4-7 steps.   Gait with Eyes Closed Walks 20 ft, uses assistive device, slower speed, mild gait deviations, deviates 6-10 in outside 12 in walkway width. Ambulates 20 ft in less than 9 sec but greater than 7 sec.   Ambulating Backwards Walks 20 ft, uses assistive device, slower speed, mild gait deviations, deviates 6-10 in outside 12 in walkway width.   Steps Alternating feet, must use rail.   Total Score 22     Treatment:  Instructed patient in performance of outcome measures including  10 meter walk test, FGA, ABC scale, and MMT. FGA improved to 22/30, >22/30 indicates decreased risk for falls  Backwards walking, x4 with hand on wall, CGA for safety, min VCs to perform forward flexion of hip. Initiated into HEP.                         PT Education - 05/01/16 0928    Education provided Yes   Education Details HEP progressed,   POC   Person(s) Educated Patient;Spouse   Methods Explanation;Handout;Verbal cues   Comprehension Verbalized understanding;Verbal cues required             PT Long Term Goals - 05/01/16 0929      PT LONG TERM GOAL #1   Title Patient will improve functional gait assessment to >22/30 in order to decrease fear of falling and improve ease of community ambulation.   Time 4   Period Weeks   Status Partially Met     PT LONG TERM GOAL #2   Title Patient will be independent in HEP in order to increase patient's ability to maintain gains achieved in therapy and assist with return to PLOF.    Time 4   Period Weeks   Status On-going     PT LONG TERM GOAL #3   Title Patient will increase overall strength to 4+/5 in BLE to increase ROM/strength throughout functional activities performed in her ADLs.    Time 4   Period Weeks   Status Achieved     PT LONG TERM GOAL #4   Title Patient will increase her 10 m walk test time to >1.0 m/s in order to be a community ambulator at decreased risk for falls.    Time 4   Period Weeks   Status Partially Met     PT LONG TERM GOAL #5   Title Patient will report a worst pain in right hip of 3/10 in last few days to demonstrate improved tolerance with ADLs.   Time 4   Period Weeks   Status Partially Met               Plan - 05/01/16 1208    Clinical Impression Statement Pt's goals and outcome measures were assessed. The patient is progressing towards her goals with improvement in measures excpet 10 meter walk test and R hip pain. She requires supervision-CGA during testing and  min VCs for appropriate technique.  Due to the patient's continued dynamic imbalance, the patient would continue to benefit from therapy to address hip weakness and dynamic balance.   Rehab Potential Good   Clinical Impairments Affecting Rehab Potential Postive Factors: Good family support, motivated; Negative Factors: PMH including CA, OA, recent R THA; Clinical Impression: Evolving- falls risk, involved PMH, inner ear problems    PT Frequency 1x / week   PT Duration 4 weeks   PT Treatment/Interventions ADLs/Self Care Home Management;Biofeedback;Cryotherapy;Moist Heat;DME Instruction;Gait training;Stair training;Functional mobility training;Therapeutic activities;Therapeutic exercise;Balance training;Neuromuscular re-education;Patient/family education;Manual techniques;Energy conservation;Vestibular   PT Next Visit Plan build HEP with more balance, dynamic balance, glut max activating exercises, single leg stance,, and backwards walking, LE leg press, resisted hip flexion   PT Home Exercise Plan HEP progressed, see patient instructions   Consulted and Agree with Plan of Care Patient;Family member/caregiver      Patient will benefit from skilled therapeutic intervention in order to improve the following deficits and impairments:  Decreased activity tolerance, Decreased balance, Decreased coordination, Decreased endurance, Decreased mobility, Decreased safety awareness, Decreased strength, Difficulty walking, Hypomobility, Impaired sensation, Impaired UE functional use, Improper body mechanics, Postural dysfunction, Impaired flexibility, Pain  Visit Diagnosis: Unsteadiness on feet - Plan: PT plan of care cert/re-cert  Pain in right hip - Plan: PT plan of care cert/re-cert  Difficulty in walking, not elsewhere classified - Plan: PT plan of care cert/re-cert  Muscle weakness (generalized) - Plan: PT plan of care cert/re-cert     Problem List Patient Active Problem List   Diagnosis  Date Noted   . Cancer of overlapping sites of left female breast (H[facility]) 01/23/2016  . S/P right THA, AA 09/04/2015  . History of artificial joint 09/04/2015  . 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 Malignant neoplasm of female breast (HCC) 07/18/2015  . Neoplasm of meninges 07/18/2015  . Breast cancer metastasized to axillary lymph node (HCC) 01/24/2015  . DDD (degenerative disc disease), lumbar 01/02/2015  . HLD (hyperlipidemia) 01/02/2015  . Benign neoplasm of meninges (HCC) 01/02/2015  . Arthritis, degenerative 01/02/2015  . Psoriasis 01/02/2015  . Rotator cuff arthropathy 09/15/2012    , SPT This entire session was performed under direct supervision and direction of a licensed therapist/therapist assistant . I have personally read, edited and approve of the note as written.  Trotter,Margaret PT, DPT 05/01/2016, 2:09 PM  [facility] MAIN REHAB SERVICES [address] [place], [place], [address] Phone: [phone]   Fax:  [phone]  Name: Laurana R Xu MRN: [id number] Date of Birth: 06/12/1941    

## 2016-05-01 NOTE — Patient Instructions (Addendum)
Backward Walking    Walk backward, toes of each foot coming down first. Take long, even strides. Make sure you have a clear pathway with no obstructions when you do this. Hold on to a wall or island area for safety. Lean forward slightly at hips.   Copyright  VHI. All rights reserved.

## 2016-05-05 ENCOUNTER — Ambulatory Visit: Payer: Medicare Other | Admitting: Physical Therapy

## 2016-05-05 ENCOUNTER — Encounter: Payer: Self-pay | Admitting: Physical Therapy

## 2016-05-05 DIAGNOSIS — R2681 Unsteadiness on feet: Secondary | ICD-10-CM

## 2016-05-05 DIAGNOSIS — M6281 Muscle weakness (generalized): Secondary | ICD-10-CM

## 2016-05-05 DIAGNOSIS — M25551 Pain in right hip: Secondary | ICD-10-CM

## 2016-05-05 DIAGNOSIS — R262 Difficulty in walking, not elsewhere classified: Secondary | ICD-10-CM

## 2016-05-05 NOTE — Therapy (Signed)
[place] MAIN [facility] SERVICES [address] [place], [place], [address] Phone: [phone]   Fax:  [phone]  Physical Therapy Treatment  Patient Details  Name: [name] MRN: [id number] Date of Birth: 01-Jan-1941 Referring Provider: Dr. Pryor Ochoa  Encounter Date: 05/05/2016      PT End of Session - 05/05/16 0843    Visit Number 19   Number of Visits 29   Date for PT Re-Evaluation 05/29/16   Authorization Type Gcode 9   Authorization Time Period 10   PT Start Time 0804   PT Stop Time 0844   PT Time Calculation (min) 40 min   Equipment Utilized During Treatment Gait belt   Activity Tolerance Patient tolerated treatment well   Behavior During Therapy [facility] for tasks assessed/performed      Past Medical History:  Diagnosis Date  . Arthritis   . 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  .Marland KitchenCataract    bilat   . 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 Hearing loss   . History of cancer chemotherapy   . History of radiation therapy   . Imbalance   . Memory impairment    seen by Dr SManuella Ghazi possible post crainiotomy from radiation  . Numbness and tingling    right hand   . Shoulder pain, right   . Wears glasses     Past Surgical History:  Procedure Laterality Date  . APPENDECTOMY  1950  . BRAIN SURGERY  1995   left frontal/temporal  . BREAST BIOPSY Left 1997  . BREAST BIOPSY Left 12/15/14   confirmed DCIS  . BREAST BIOPSY Left 01/08/2015   Procedure: BREAST BIOPSY WITH NEEDLE LOCALIZATION;  Surgeon: [name] MD;  Location: [facility] ORS;  Service: General;  Laterality: Left;  . BREAST LUMPECTOMY Left 01/08/2015   Procedure: LUMPECTOMY;   Surgeon: [name] MD;  Location: [facility] ORS;  Service: General;  Laterality: Left;  . COLONOSCOPY  2010   Dr. ETiffany Kocher . PORTACATH PLACEMENT Right 01/16/2015   Procedure: INSERTION PORT-A-CATH;  Surgeon: [name] MD;  Location: [facility] ORS;  Service: General;  Laterality: Right;  . SENTINEL NODE BIOPSY Left 01/16/2015   Procedure: SENTINEL NODE BIOPSY;  Surgeon: [name] MD;  Location: [facility] ORS;  Service: General;  Laterality: Left;  . TOTAL HIP ARTHROPLASTY Right 09/04/2015   Procedure: RIGHT TOTAL HIP ARTHROPLASTY ANTERIOR APPROACH;  Surgeon: MParalee Cancel MD;  Location: [facility];  Service: Orthopedics;  Laterality: Right;    There were no vitals filed for this visit.      Subjective Assessment - 05/05/16 0805    Subjective Pt reports that her R hip has been popping.  She is going to visit the doctor to get his opinion.  Pt reports that she has constant R hip pain at 5/10.     Patient is accompained by: Family member   Pertinent History Factors Affecting History: PMH of CA, inner ear problems, and recent R THA, supportive husband/family, 19 stairs to go from downstairs to upstairs bedroom    Limitations Walking;Standing   How long can you sit comfortably? NA   How long  can you stand comfortably? 10 minutes    How long can you walk comfortably? 100 yds.   Patient Stated Goals Patient wishes to address balance and strength so she can walk more comfortably. Her husband wishes for her to gain confidence in her abilities to perform functional activities.    Currently in Pain? Yes   Pain Score 5    Pain Location Hip   Pain Orientation Right       Treatment Step taps on bosu ball ball, 3 sets x 10 reps, 1 HHA, min VCs to progress towards no HHA, CGA for safety, min VCs for upright posture  Sidestepping on airex balance beam x 4 laps, min VCs to progress towards no HHA, CGA for safety  Tandem walking forwards/backward x 4 laps, 1 HHA, min VCs to increase step length  and maintain forward gaze  Resisted standing hip flexion with red theraband resistance, 3 sets x 10 reps, min VCs for increased hip flexion, 2 HHA for stability  Monster walks over balance beam with red theraband resistance, x 2 laps forwards/backwards, CGA for stability, min VCS for larger lateral stepping  Walking with vertical head turns x 1 lap with SPC, CGA for safety, one LOB corrected by PT  Walking with horizontal head turns x 1 lap with SPC, CGa for safety, demonstrated some lateral sway, one LOB self corrected  Leg Press, BLEs, #75, 3 sets x 10 reps, min VCs to decrease terminal knee extension                          PT Education - 05/05/16 0837    Education provided Yes   Education Details continuation of HEP    Person(s) Educated Patient   Methods Demonstration;Explanation;Verbal cues   Comprehension Verbalized understanding;Returned demonstration;Verbal cues required             PT Long Term Goals - 05/01/16 0929      PT LONG TERM GOAL #1   Title Patient will improve functional gait assessment to >22/30 in order to decrease fear of falling and improve ease of community ambulation.   Time 4   Period Weeks   Status Partially Met     PT LONG TERM GOAL #2   Title Patient will be independent in HEP in order to increase patient's ability to maintain gains achieved in therapy and assist with return to PLOF.    Time 4   Period Weeks   Status On-going     PT LONG TERM GOAL #3   Title Patient will increase overall strength to 4+/5 in BLE to increase ROM/strength throughout functional activities performed in her ADLs.    Time 4   Period Weeks   Status Achieved     PT LONG TERM GOAL #4   Title Patient will increase her 10 m walk test time to >1.0 m/s in order to be a community ambulator at decreased risk for falls.    Time 4   Period Weeks   Status Partially Met     PT LONG TERM GOAL #5   Title Patient will report a worst pain in right hip of  3/10 in last few days to demonstrate improved tolerance with ADLs.   Time 4   Period Weeks   Status Partially Met               Plan - 05/05/16 0844    Clinical Impression Statement Pt reported 5/10 R hip pain  today and reports she is going to see her MD about the constant R hip clicking when she walks.  Advanced hip strengthening with monster walks over beam which was challenging for patient to maintain single leg balance.  Pt required CGA for anterior/posterior and lateral head turns ambulating with SPC.  Pt demonstrated greater ability with tandem stance and sidestepping on blue airex balance beam but did require 1 HHA and min VCs for upright posture.  Pt reported no R hip pain at end of session.  She would benefit from further skilled PT to increase her LE strength and dynamic balance for greater functional mobility.     Rehab Potential Good   Clinical Impairments Affecting Rehab Potential Postive Factors: Good family support, motivated; Negative Factors: PMH including CA, OA, recent R THA; Clinical Impression: Evolving- falls risk, involved PMH, inner ear problems    PT Frequency 1x / week   PT Duration 4 weeks   PT Treatment/Interventions ADLs/Self Care Home Management;Biofeedback;Cryotherapy;Moist Heat;DME Instruction;Gait training;Stair training;Functional mobility training;Therapeutic activities;Therapeutic exercise;Balance training;Neuromuscular re-education;Patient/family education;Manual techniques;Energy conservation;Vestibular   PT Next Visit Plan build HEP with more balance, dynamic balance, glut max activating exercises, single leg stance,, and backwards walking, LE leg press, resisted hip flexion   PT Home Exercise Plan HEP progressed, see patient instructions   Consulted and Agree with Plan of Care Patient;Family member/caregiver      Patient will benefit from skilled therapeutic intervention in order to improve the following deficits and impairments:  Decreased activity  tolerance, Decreased balance, Decreased coordination, Decreased endurance, Decreased mobility, Decreased safety awareness, Decreased strength, Difficulty walking, Hypomobility, Impaired sensation, Impaired UE functional use, Improper body mechanics, Postural dysfunction, Impaired flexibility, Pain  Visit Diagnosis: Unsteadiness on feet  Pain in right hip  Difficulty in walking, not elsewhere classified  Muscle weakness (generalized)     Problem List Patient Active Problem List   Diagnosis Date Noted  . Cancer of overlapping sites of left female breast ([place]) 01/23/2016  . S/P right THA, AA 09/04/2015  . History of artificial joint 09/04/2015  . 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 Malignant neoplasm of female breast ([place]) 07/18/2015  . Neoplasm of meninges 07/18/2015  . Breast cancer metastasized to axillary lymph node ([place]) 01/24/2015  . DDD (degenerative disc disease), lumbar 01/02/2015  . HLD (hyperlipidemia) 01/02/2015  . Benign neoplasm of meninges ([place]) 01/02/2015  . Arthritis, degenerative 01/02/2015  . Psoriasis 01/02/2015  . Rotator cuff arthropathy 09/15/2012   Stacy Gardner, SPT  This entire session was performed under direct supervision and direction of a licensed therapist/therapist assistant . I have personally read, edited and approve of the note as written.  Trotter,Margaret PT, DPT 05/05/2016, 10:49 AM  [place] MAIN [facility] SERVICES [address] [place], [place], [address] Phone: [phone]   Fax:  [phone]  Name: [name] MRN: [id number] Date of Birth: [birth year]

## 2016-05-06 ENCOUNTER — Ambulatory Visit: Payer: Medicare Other | Admitting: Physical Therapy

## 2016-05-08 ENCOUNTER — Ambulatory Visit: Payer: Medicare Other | Admitting: Physical Therapy

## 2016-05-12 ENCOUNTER — Encounter: Payer: Self-pay | Admitting: Physical Therapy

## 2016-05-12 ENCOUNTER — Ambulatory Visit: Payer: Medicare Other | Admitting: Physical Therapy

## 2016-05-12 DIAGNOSIS — M25551 Pain in right hip: Secondary | ICD-10-CM

## 2016-05-12 DIAGNOSIS — R2681 Unsteadiness on feet: Secondary | ICD-10-CM | POA: Diagnosis not present

## 2016-05-12 DIAGNOSIS — R262 Difficulty in walking, not elsewhere classified: Secondary | ICD-10-CM

## 2016-05-12 DIAGNOSIS — M6281 Muscle weakness (generalized): Secondary | ICD-10-CM

## 2016-05-12 NOTE — Therapy (Signed)
[place] MAIN [facility] SERVICES [address] [place], [place], [address] Phone: [phone]   Fax:  [phone]  Physical Therapy Treatment  Patient Details  Name: [name] MRN: [id number] Date of Birth: 03/02/1941  Referring Provider: Dr. Pryor Ochoa  Encounter Date: 05/12/2016      PT End of Session - 05/12/16 0850    Visit Number 20   Number of Visits 29   Date for PT Re-Evaluation 05/29/16   Authorization Type Gcode 10   Authorization Time Period 10   PT Start Time 0802   PT Stop Time 0845   PT Time Calculation (min) 43 min   Equipment Utilized During Treatment Gait belt   Activity Tolerance Patient tolerated treatment well   Behavior During Therapy [facility] for tasks assessed/performed      Past Medical History:  Diagnosis Date  . Arthritis   . 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 Breast cancer of upper-inner quadrant of left female breast ([place]) 12/15/14   Completed radiation end of December and finished chemotherapy 2 weeks ago, Left breast invasive mammary carcinoma, T1cN8mc (1.5 cm); Grade 3, [name], [name], [name], .  .Marland KitchenCataract    bilat   . 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 Hearing loss   . History of cancer chemotherapy   . History of radiation therapy   . Imbalance   . Memory impairment    seen by Dr SManuella Ghazi possible post crainiotomy from radiation  . Numbness and tingling    right hand   . Shoulder pain, right   . Wears glasses     Past Surgical History:  Procedure Laterality Date  . APPENDECTOMY  1950  . BRAIN SURGERY  1995   left frontal/temporal  . BREAST BIOPSY Left 1997  . BREAST BIOPSY Left 12/15/14   confirmed DCIS  . BREAST BIOPSY Left 01/08/2015   Procedure: BREAST BIOPSY WITH NEEDLE LOCALIZATION;  Surgeon: [name] MD;  Location: [facility] ORS;  Service: General;  Laterality: Left;  . BREAST LUMPECTOMY Left 01/08/2015   Procedure:  LUMPECTOMY;  Surgeon: [name] MD;  Location: [facility] ORS;  Service: General;  Laterality: Left;  . COLONOSCOPY  2010   Dr. ETiffany Kocher . PORTACATH PLACEMENT Right 01/16/2015   Procedure: INSERTION PORT-A-CATH;  Surgeon: [name] MD;  Location: [facility] ORS;  Service: General;  Laterality: Right;  . SENTINEL NODE BIOPSY Left 01/16/2015   Procedure: SENTINEL NODE BIOPSY;  Surgeon: [name] MD;  Location: [facility] ORS;  Service: General;  Laterality: Left;  . TOTAL HIP ARTHROPLASTY Right 09/04/2015   Procedure: RIGHT TOTAL HIP ARTHROPLASTY ANTERIOR APPROACH;  Surgeon: MParalee Cancel MD;  Location: [facility];  Service: Orthopedics;  Laterality: Right;    There were no vitals filed for this visit.      Subjective Assessment - 05/12/16 0802    Subjective Pt states she has a cough but that it is not productive, and she does not have any cold symptoms. She states she does not have any doctors appointment scheduled but she plans on it.    Patient is accompained by: Family member   Pertinent History Factors Affecting History: PMH of CA, inner ear problems, and recent R THA, supportive husband/family, 19 stairs to go from downstairs to upstairs bedroom    Limitations Walking;Standing   How long can you sit comfortably? NA  How long can you stand comfortably? 10 minutes    How long can you walk comfortably? 100 yds.   Patient Stated Goals Patient wishes to address balance and strength so she can walk more comfortably. Her husband wishes for her to gain confidence in her abilities to perform functional activities.    Currently in Pain? Yes   Pain Score 4    Pain Location Hip   Pain Orientation Right   Pain Descriptors / Indicators Dull;Constant      Treatment:  NuStep, BLE/UE at L2 x3 min; quick rest at 2 min. Due to LE fatigue Monster over wooden plank with red tband resistance, forwards and backwards x2 each, CGA for safety, min VCs to increase step length Lateral walking with red  tband in // bars, x4 passes, no HHA, CGA for safety, min VCs for increase step length Hip flexion against red tband, 2x15 2 HHA, min VCs to decrease lateral trunk lean Standing marches on firm surface, 2x10 no HHA, min VCs to decrease trunk lean an decrease glut squeeze during SLS Dual task walking with SBQC, 2x10 meters, with horizontal and vertical head movements, CGA-minA for stability Agility ladder; CGA-minA for stability    Forward walking 2 feet each opening; x2    Forward walking 1 foot each opening; x2    Lateral walking 2 feet each opening; x2 each LE leading, min VCs to increase step length and contract glut muscle Tandem stance on airex, horizontal and vertical head movements 2x10 each LE leading, CGA-minA for safety Forward taps on 4" step, 2x15, min VCs to contract glut muscle for stability and decrease lateral trunk lean, no HHA, CGA for safety BLE leg press, 75#, 3x10, min VCs to decrease knee hyperextension Instructed to ambulate with SBQC, 2 x90 feet; min VCs to decrease lateral trunk lean                             PT Education - 05/12/16 0849    Education provided Yes   Education Details continuation of HEP, safety with mobility   Person(s) Educated Patient   Methods Explanation   Comprehension Verbalized understanding             PT Long Term Goals - 05/12/16 0904      PT LONG TERM GOAL #1   Title Patient will improve functional gait assessment to >22/30 in order to decrease fear of falling and improve ease of community ambulation.   Time 4   Period Weeks   Status Partially Met     PT LONG TERM GOAL #2   Title Patient will be independent in HEP in order to increase patient's ability to maintain gains achieved in therapy and assist with return to PLOF.    Time 4   Period Weeks   Status Partially Met     PT LONG TERM GOAL #3   Title Patient will increase overall strength to 4+/5 in BLE to increase ROM/strength throughout functional  activities performed in her ADLs.    Time 4   Period Weeks   Status Achieved     PT LONG TERM GOAL #4   Title Patient will increase her 10 m walk test time to >1.0 m/s in order to be a community ambulator at decreased risk for falls.    Time 4   Period Weeks   Status Partially Met     PT LONG TERM GOAL #5   Title Patient  will report a worst pain in right hip of 3/10 in last few days to demonstrate improved tolerance with ADLs.   Time 4   Period Weeks   Status On-going               Plan - 2016/05/20 0850    Clinical Impression Statement Although patient reports with a mild cough, she is not limited in therapy today. She continues to have R hip popping with ambulation intermittently. The "popping" seems to decrease as patient does therapy and with VCs to decrease lateral trunk lean. The patient denies any increase in pain during therapy. She requires CGA-minA during balance activities for safety and min VCs for appropriate technique of these activities. The patient would continue to benefit from 1-2 more sessions in order to improve static/dynamic balance and increase safety with mobility.   Rehab Potential Good   Clinical Impairments Affecting Rehab Potential Postive Factors: Good family support, motivated; Negative Factors: PMH including CA, OA, recent R THA; Clinical Impression: Evolving- falls risk, involved PMH, inner ear problems    PT Frequency 1x / week   PT Duration 4 weeks   PT Treatment/Interventions ADLs/Self Care Home Management;Biofeedback;Cryotherapy;Moist Heat;DME Instruction;Gait training;Stair training;Functional mobility training;Therapeutic activities;Therapeutic exercise;Balance training;Neuromuscular re-education;Patient/family education;Manual techniques;Energy conservation;Vestibular   PT Next Visit Plan build HEP with more balance, dynamic balance, glut max activating exercises, single leg stance,, and backwards walking, LE leg press, resisted hip flexion   PT  Home Exercise Plan HEP maintained, see patient instructions   Consulted and Agree with Plan of Care Patient;Family member/caregiver      Patient will benefit from skilled therapeutic intervention in order to improve the following deficits and impairments:  Decreased activity tolerance, Decreased balance, Decreased coordination, Decreased endurance, Decreased mobility, Decreased safety awareness, Decreased strength, Difficulty walking, Hypomobility, Impaired sensation, Impaired UE functional use, Improper body mechanics, Postural dysfunction, Impaired flexibility, Pain  Visit Diagnosis: Unsteadiness on feet  Pain in right hip  Difficulty in walking, not elsewhere classified  Muscle weakness (generalized)       G-Codes - May 20, 2016 1030    Functional Assessment Tool Used Clinical Judgement, functional mobility, strength   Functional Limitation Mobility: Walking and moving around   Mobility: Walking and Moving Around Current Status [phone]) At least 1 percent but less than 20 percent impaired, limited or restricted   Mobility: Walking and Moving Around Goal Status [phone]) At least 1 percent but less than 20 percent impaired, limited or restricted      Problem List Patient Active Problem List   Diagnosis Date Noted  . Cancer of overlapping sites of left female breast ([place]) 01/23/2016  . S/P right THA, AA 09/04/2015  . History of artificial joint 09/04/2015  . 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 Malignant neoplasm of female breast ([place]) 07/18/2015  . Neoplasm of meninges 07/18/2015  . Breast cancer metastasized to axillary lymph node ([place]) 01/24/2015  . DDD (degenerative disc disease), lumbar 01/02/2015  . HLD (hyperlipidemia) 01/02/2015  . Benign neoplasm of meninges ([place]) 01/02/2015  . Arthritis, degenerative 01/02/2015  . Psoriasis 01/02/2015  . Rotator cuff arthropathy 09/15/2012   Tilman Neat, SPT  This entire session was performed under direct  supervision and direction of a licensed therapist/therapist assistant . I have personally read, edited and approve of the note as written.  Collie Siad PT, DPT 2016-05-20, 10:32 AM  [place] MAIN [facility] SERVICES [address] [place], [place], [address] Phone: [phone]  Fax:  [phone]  Name: ALIJAH HYDE MRN: [id number] Date of Birth: 19-May-1941

## 2016-05-15 ENCOUNTER — Inpatient Hospital Stay: Payer: Medicare Other

## 2016-05-19 ENCOUNTER — Ambulatory Visit: Payer: Medicare Other | Admitting: Physical Therapy

## 2016-05-19 ENCOUNTER — Encounter: Payer: Self-pay | Admitting: Physical Therapy

## 2016-05-19 DIAGNOSIS — R2681 Unsteadiness on feet: Secondary | ICD-10-CM

## 2016-05-19 DIAGNOSIS — M6281 Muscle weakness (generalized): Secondary | ICD-10-CM

## 2016-05-19 DIAGNOSIS — M25551 Pain in right hip: Secondary | ICD-10-CM

## 2016-05-19 DIAGNOSIS — R262 Difficulty in walking, not elsewhere classified: Secondary | ICD-10-CM

## 2016-05-19 NOTE — Therapy (Signed)
[place] MAIN [facility] SERVICES [address] [place], [place], [address] Phone: [phone]   Fax:  [phone]  Physical Therapy Treatment  Patient Details  Name: [name] MRN: [id number] Date of Birth: 1941/01/25 Referring Provider: Dr. Pryor Ochoa  Encounter Date: 05/19/2016      PT End of Session - 05/19/16 0841    Visit Number 21   Number of Visits 29   Date for PT Re-Evaluation 05/29/16   Authorization Type Gcode 1   Authorization Time Period 10   PT Start Time 0802   PT Stop Time 0845   PT Time Calculation (min) 43 min   Equipment Utilized During Treatment Gait belt   Activity Tolerance Patient tolerated treatment well   Behavior During Therapy [facility] for tasks assessed/performed      Past Medical History:  Diagnosis Date  . Arthritis   . 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 Breast cancer of upper-inner quadrant of left female breast ([place]) 12/15/14   Completed radiation end of December and finished chemotherapy 2 weeks ago, Left breast invasive mammary carcinoma, T1cN42mc (1.5 cm); Grade 3, [name], [name], [name], .  .Marland KitchenCataract    bilat   . 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 Hearing loss   . History of cancer chemotherapy   . History of radiation therapy   . Imbalance   . Memory impairment    seen by Dr SManuella Ghazi possible post crainiotomy from radiation  . Numbness and tingling    right hand   . Shoulder pain, right   . Wears glasses     Past Surgical History:  Procedure Laterality Date  . APPENDECTOMY  1950  . BRAIN SURGERY  1995   left frontal/temporal  . BREAST BIOPSY Left 1997  . BREAST BIOPSY Left 12/15/14   confirmed DCIS  . BREAST BIOPSY Left 01/08/2015   Procedure: BREAST BIOPSY WITH NEEDLE LOCALIZATION;  Surgeon: [name] MD;  Location: [facility] ORS;  Service: General;  Laterality: Left;  . BREAST LUMPECTOMY Left 01/08/2015   Procedure:  LUMPECTOMY;  Surgeon: [name] MD;  Location: [facility] ORS;  Service: General;  Laterality: Left;  . COLONOSCOPY  2010   Dr. ETiffany Kocher . PORTACATH PLACEMENT Right 01/16/2015   Procedure: INSERTION PORT-A-CATH;  Surgeon: [name] MD;  Location: [facility] ORS;  Service: General;  Laterality: Right;  . SENTINEL NODE BIOPSY Left 01/16/2015   Procedure: SENTINEL NODE BIOPSY;  Surgeon: [name] MD;  Location: [facility] ORS;  Service: General;  Laterality: Left;  . TOTAL HIP ARTHROPLASTY Right 09/04/2015   Procedure: RIGHT TOTAL HIP ARTHROPLASTY ANTERIOR APPROACH;  Surgeon: MParalee Cancel MD;  Location: [facility];  Service: Orthopedics;  Laterality: Right;    There were no vitals filed for this visit.      Subjective Assessment - 05/19/16 0800    Subjective Pt reports that she is hurting been hurting "everywhere" more so than usual which she attributes to her OA.  Pt reports the exercises have been going ok but she has not been doing them as much.     Patient is accompained by: Family member   Pertinent History Factors Affecting History: PMH of CA, inner ear problems, and recent R THA, supportive husband/family, 19 stairs to go from downstairs to upstairs bedroom    Limitations Walking;Standing   How long can you sit comfortably? NA  How long can you stand comfortably? 10 minutes    How long can you walk comfortably? 100 yds.   Patient Stated Goals Patient wishes to address balance and strength so she can walk more comfortably. Her husband wishes for her to gain confidence in her abilities to perform functional activities.    Currently in Pain? Yes   Pain Score 5    Pain Location Hip   Pain Orientation Right      Treatment Hip Extension cable machine, #2.5, attempted #7.5 but too difficult, 2 HHA for stability, min VCs for upright posture, 2 sets x 10 reps each LE   Ladder drills across red mat without quad cane   -2 laps reciprocal stepping, CGA no LOB   -2 laps lateral stepping,  CGA, min VCs for slower more controlled steps   -2 laps abduction stepping, min A for stability, several LOB corrected by PT, min VCs for proper sequencing  Lateral stepping across airex balance beam with ball toss, x 4 laps, pt able to perform without HHA or LOB, CGA for safety, min VCs for greater glut activation and upright posture while sidestepping   Obstacle course including walking across stepping stones, weaving around cones and step ups forward without SPC, x 4 laps, CGA for stability, min VCs for increase trunk rotation while weaving through cones, no LOB, pt demonstrated most difficulty walking across stepping stones  Bosu lunge anterior/posterior rocks, 2 sets x 10 reps BLEs, no HHA, min A for stability, min VCs for increased anterior lean to challenge balance  Bridges with red theraband resistance and feet on airex pad, 2 sets x 10 reps, min VCs for proper technique and increased glut activation                           PT Education - 05/19/16 0841    Education provided Yes   Education Details reinforced HEP    Person(s) Educated Patient   Methods Explanation;Demonstration;Verbal cues   Comprehension Verbalized understanding;Returned demonstration;Verbal cues required             PT Long Term Goals - 05/12/16 0904      PT LONG TERM GOAL #1   Title Patient will improve functional gait assessment to >22/30 in order to decrease fear of falling and improve ease of community ambulation.   Time 4   Period Weeks   Status Partially Met     PT LONG TERM GOAL #2   Title Patient will be independent in HEP in order to increase patient's ability to maintain gains achieved in therapy and assist with return to PLOF.    Time 4   Period Weeks   Status Partially Met     PT LONG TERM GOAL #3   Title Patient will increase overall strength to 4+/5 in BLE to increase ROM/strength throughout functional activities performed in her ADLs.    Time 4   Period Weeks    Status Achieved     PT LONG TERM GOAL #4   Title Patient will increase her 10 m walk test time to >1.0 m/s in order to be a community ambulator at decreased risk for falls.    Time 4   Period Weeks   Status Partially Met     PT LONG TERM GOAL #5   Title Patient will report a worst pain in right hip of 3/10 in last few days to demonstrate improved tolerance with ADLs.   Time  4   Period Weeks   Status On-going               Plan - 05/19/16 6644    Clinical Impression Statement Pt reports more overall LE discomfort due to her OA today but was still able to participate fully in weightbearing position.  Pt was challenged with abduction steps through agility ladder on red mat.  Pt demonstrated several LOB that were corrected by therapist.  Pt was able to perform obstacle course including step ups, weaving through cones and walking on stepping stones without losing her balance.  Pt demonstrated difficulty weaving around cones with increased trunk rotation.  She reports no increased pain after therapy session.  Reinforced HEP and reminded pt to bring any questions regarding HEP to last visit next week.  She would benefit from further skilled PT to ensure she has full HEP at home.     Rehab Potential Good   Clinical Impairments Affecting Rehab Potential Postive Factors: Good family support, motivated; Negative Factors: PMH including CA, OA, recent R THA; Clinical Impression: Evolving- falls risk, involved PMH, inner ear problems    PT Frequency 1x / week   PT Duration 4 weeks   PT Treatment/Interventions ADLs/Self Care Home Management;Biofeedback;Cryotherapy;Moist Heat;DME Instruction;Gait training;Stair training;Functional mobility training;Therapeutic activities;Therapeutic exercise;Balance training;Neuromuscular re-education;Patient/family education;Manual techniques;Energy conservation;Vestibular   PT Next Visit Plan build HEP with more balance, dynamic balance, glut max activating  exercises, single leg stance,, and backwards walking, LE leg press, resisted hip flexion   PT Home Exercise Plan HEP maintained, see patient instructions   Consulted and Agree with Plan of Care Patient;Family member/caregiver      Patient will benefit from skilled therapeutic intervention in order to improve the following deficits and impairments:  Decreased activity tolerance, Decreased balance, Decreased coordination, Decreased endurance, Decreased mobility, Decreased safety awareness, Decreased strength, Difficulty walking, Hypomobility, Impaired sensation, Impaired UE functional use, Improper body mechanics, Postural dysfunction, Impaired flexibility, Pain  Visit Diagnosis: Unsteadiness on feet  Pain in right hip  Difficulty in walking, not elsewhere classified  Muscle weakness (generalized)     Problem List Patient Active Problem List   Diagnosis Date Noted  . Cancer of overlapping sites of left female breast ([place]) 01/23/2016  . S/P right THA, AA 09/04/2015  . History of artificial joint 09/04/2015  . 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 Malignant neoplasm of female breast ([place]) 07/18/2015  . Neoplasm of meninges 07/18/2015  . Breast cancer metastasized to axillary lymph node ([place]) 01/24/2015  . DDD (degenerative disc disease), lumbar 01/02/2015  . HLD (hyperlipidemia) 01/02/2015  . Benign neoplasm of meninges ([place]) 01/02/2015  . Arthritis, degenerative 01/02/2015  . Psoriasis 01/02/2015  . Rotator cuff arthropathy 09/15/2012   Stacy Gardner, SPT  This entire session was performed under direct supervision and direction of a licensed therapist/therapist assistant . I have personally read, edited and approve of the note as written.  Trotter,Margaret PT, DPT 05/19/2016, 9:02 AM  [place] MAIN [facility] SERVICES [address] [place], [place], [address] Phone: [phone]   Fax:  [phone]  Name: TRAVIA ONSTAD MRN: [id number] Date of Birth: Sep 16, 1940

## 2016-05-26 ENCOUNTER — Encounter: Payer: Self-pay | Admitting: Physical Therapy

## 2016-05-26 ENCOUNTER — Ambulatory Visit: Payer: Medicare Other | Admitting: Physical Therapy

## 2016-05-26 DIAGNOSIS — R262 Difficulty in walking, not elsewhere classified: Secondary | ICD-10-CM

## 2016-05-26 DIAGNOSIS — M6281 Muscle weakness (generalized): Secondary | ICD-10-CM

## 2016-05-26 DIAGNOSIS — M25551 Pain in right hip: Secondary | ICD-10-CM

## 2016-05-26 DIAGNOSIS — R2681 Unsteadiness on feet: Secondary | ICD-10-CM

## 2016-05-26 NOTE — Therapy (Signed)
[place] MAIN [facility] SERVICES [address] [place], [place], [address] Phone: [phone]   Fax:  [phone]  Physical Therapy Treatment/Discharge Summary  Patient Details  Name: [name] MRN: [id number] Date of Birth: 12-Dec-1940 Referring Provider: Dr. Pryor Ochoa  Encounter Date: 05/26/2016      PT End of Session - 05/26/16 0949    Visit Number 22   Number of Visits 29   Date for PT Re-Evaluation 05/29/16   Authorization Type Gcode 2   Authorization Time Period 10   PT Start Time 0850   PT Stop Time 0930   PT Time Calculation (min) 40 min   Equipment Utilized During Treatment Gait belt   Activity Tolerance Patient tolerated treatment well   Behavior During Therapy [facility] for tasks assessed/performed      Past Medical History:  Diagnosis Date  . Arthritis   . 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 Breast cancer of upper-inner quadrant of left female breast ([place]) 12/15/14   Completed radiation end of December and finished chemotherapy 2 weeks ago, Left breast invasive mammary carcinoma, T1cN65mc (1.5 cm); Grade 3, [name], [name], [name], .  .Marland KitchenCataract    bilat   . 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 Hearing loss   . History of cancer chemotherapy   . History of radiation therapy   . Imbalance   . Memory impairment    seen by Dr SManuella Ghazi possible post crainiotomy from radiation  . Numbness and tingling    right hand   . Shoulder pain, right   . Wears glasses     Past Surgical History:  Procedure Laterality Date  . APPENDECTOMY  1950  . BRAIN SURGERY  1995   left frontal/temporal  . BREAST BIOPSY Left 1997  . BREAST BIOPSY Left 12/15/14   confirmed DCIS  . BREAST BIOPSY Left 01/08/2015   Procedure: BREAST BIOPSY WITH NEEDLE LOCALIZATION;  Surgeon: [name] MD;  Location: [facility] ORS;  Service: General;  Laterality: Left;  . BREAST LUMPECTOMY Left 01/08/2015    Procedure: LUMPECTOMY;  Surgeon: [name] MD;  Location: [facility] ORS;  Service: General;  Laterality: Left;  . COLONOSCOPY  2010   Dr. ETiffany Kocher . PORTACATH PLACEMENT Right 01/16/2015   Procedure: INSERTION PORT-A-CATH;  Surgeon: [name] MD;  Location: [facility] ORS;  Service: General;  Laterality: Right;  . SENTINEL NODE BIOPSY Left 01/16/2015   Procedure: SENTINEL NODE BIOPSY;  Surgeon: [name] MD;  Location: [facility] ORS;  Service: General;  Laterality: Left;  . TOTAL HIP ARTHROPLASTY Right 09/04/2015   Procedure: RIGHT TOTAL HIP ARTHROPLASTY ANTERIOR APPROACH;  Surgeon: MParalee Cancel MD;  [facility];  Service: Orthopedics;  Laterality: Right;    There were no vitals filed for this visit.      Subjective Assessment - 05/26/16 0853    Subjective Pt notes the HEP has been going well. She reports she has no questions about her HEP and that she will continue doing them.    Patient is accompained by: Family member   Pertinent History Factors Affecting History: PMH of CA, inner ear problems, and recent R THA, supportive husband/family, 19 stairs to go from downstairs to upstairs bedroom    Limitations Walking;Standing   How long can you sit comfortably? NA   How long can you stand comfortably? 10 minutes  How long can you walk comfortably? 100 yds.   Patient Stated Goals Patient wishes to address balance and strength so she can walk more comfortably. Her husband wishes for her to gain confidence in her abilities to perform functional activities.    Currently in Pain? Yes   Pain Score 4    Pain Location Hip   Pain Orientation Right   Pain Descriptors / Indicators Constant;Dull            [facility] PT Assessment - 05/26/16 0001      Observation/Other Assessments   Activities of Balance Confidence Scale (ABC Scale)  68%, mild decrease from 04/03/16 at 71.3%     Strength   Right Hip Flexion 4/5   Right Hip ABduction 4+/5   Left Hip Flexion 4+/5   Left Hip  ABduction 4+/5   Right Knee Flexion 4+/5   Right Knee Extension 4+/5   Left Knee Flexion 4+/5   Left Knee Extension 4+/5   Right Ankle Dorsiflexion 5/5   Left Ankle Dorsiflexion 5/5     Standardized Balance Assessment   10 Meter Walk 0.63ms, supervison, SBQC, improved from 0.739m on 05/01/16     Functional Gait  Assessment   Gait Level Surface Walks 20 ft in less than 7 sec but greater than 5.5 sec, uses assistive device, slower speed, mild gait deviations, or deviates 6-10 in outside of the 12 in walkway width.   Change in Gait Speed Able to smoothly change walking speed without loss of balance or gait deviation. Deviate no more than 6 in outside of the 12 in walkway width.   Gait with Horizontal Head Turns Performs head turns smoothly with slight change in gait velocity (eg, minor disruption to smooth gait path), deviates 6-10 in outside 12 in walkway width, or uses an assistive device.   Gait with Vertical Head Turns Performs task with slight change in gait velocity (eg, minor disruption to smooth gait path), deviates 6 - 10 in outside 12 in walkway width or uses assistive device   Gait and Pivot Turn Pivot turns safely within 3 sec and stops quickly with no loss of balance.   Step Over Obstacle Is able to step over one shoe box (4.5 in total height) without changing gait speed. No evidence of imbalance.   Gait with Narrow Base of Support Ambulates less than 4 steps heel to toe or cannot perform without assistance.   Gait with Eyes Closed Walks 20 ft, uses assistive device, slower speed, mild gait deviations, deviates 6-10 in outside 12 in walkway width. Ambulates 20 ft in less than 9 sec but greater than 7 sec.   Ambulating Backwards Walks 20 ft, uses assistive device, slower speed, mild gait deviations, deviates 6-10 in outside 12 in walkway width.   Steps Alternating feet, must use rail.   Total Score 20     Treatment:  Patient's goals and outcome measures re-assessed. The patient  has hit a plateau and was dicharged. FGA decreased from 22/30 to 20/30, supervision, SBQC  Wall squats, 3x10, rests in between sets, min VCs for initial set up Tandem stance, 2x15 seconds, HHA to get into position, 1 LOB with minA from SPT in order to maintain upright position    Instructed to perform at home in corner with chair in front for safety, 2nd set practiced this way Lateral walking, red tband restiance, x3 each direction in // bars, CGA for safety, min VCs to perform with mild knee bend Instructed to ambulate with  SBQC, min VCs to continue firely glut muscle and decrease lateral trunk lean, 2x10 meters, CGA for safety                         PT Education - 05/26/16 0948    Education provided Yes   Education Details seeing MD about continued "popping" in R hip that seems to be getting worse, reviewing HEP   Person(s) Educated Patient   Methods Explanation   Comprehension Verbalized understanding             PT Long Term Goals - 05/26/16 0854      PT LONG TERM GOAL #1   Title Patient will improve functional gait assessment to >22/30 in order to decrease fear of falling and improve ease of community ambulation.   Time 4   Period Weeks   Status Partially Met     PT LONG TERM GOAL #2   Title Patient will be independent in HEP in order to increase patient's ability to maintain gains achieved in therapy and assist with return to PLOF.    Time 4   Period Weeks   Status Achieved     PT LONG TERM GOAL #3   Title Patient will increase overall strength to 4+/5 in BLE to increase ROM/strength throughout functional activities performed in her ADLs.    Time 4   Period Weeks   Status Achieved     PT LONG TERM GOAL #4   Title Patient will increase her 10 m walk test time to >1.0 m/s in order to be a community ambulator at decreased risk for falls.    Time 4   Period Weeks   Status Partially Met     PT LONG TERM GOAL #5   Title Patient will report a  worst pain in right hip of 3/10 in last few days to demonstrate improved tolerance with ADLs.   Baseline continues to be a 4 or 5 out of 10 (05/26/16)   Time 4   Period Weeks   Status Partially Met               Plan - 05/26/16 0949    Clinical Impression Statement Patient reports continued discomfort in R hip that has not changed although she thinks "popping" has gotten worse due to the cold weather. Her goals and outcomes measures were re-assessed. The patient had plateaued or slighlty decreased on all measures. She states she performs her exercises at home but not always consistently. The patient was discharged today and instructed to continue with home bike and HEP.    Rehab Potential Good   Clinical Impairments Affecting Rehab Potential Postive Factors: Good family support, motivated; Negative Factors: PMH including CA, OA, recent R THA; Clinical Impression: Evolving- falls risk, involved PMH, inner ear problems    PT Frequency 1x / week   PT Duration 4 weeks   PT Treatment/Interventions ADLs/Self Care Home Management;Biofeedback;Cryotherapy;Moist Heat;DME Instruction;Gait training;Stair training;Functional mobility training;Therapeutic activities;Therapeutic exercise;Balance training;Neuromuscular re-education;Patient/family education;Manual techniques;Energy conservation;Vestibular   PT Next Visit Plan build HEP with more balance, dynamic balance, glut max activating exercises, single leg stance,, and backwards walking, LE leg press, resisted hip flexion   PT Home Exercise Plan HEP maintained, see patient instructions   Consulted and Agree with Plan of Care Patient;Family member/caregiver      Patient will benefit from skilled therapeutic intervention in order to improve the following deficits and impairments:  Decreased activity tolerance, Decreased balance, Decreased coordination,  Decreased endurance, Decreased mobility, Decreased safety awareness, Decreased strength, Difficulty  walking, Hypomobility, Impaired sensation, Impaired UE functional use, Improper body mechanics, Postural dysfunction, Impaired flexibility, Pain  Visit Diagnosis: Unsteadiness on feet  Pain in right hip  Difficulty in walking, not elsewhere classified  Muscle weakness (generalized)       G-Codes - 12-Jun-2016 1758    Functional Assessment Tool Used Clinical Judgement, functional mobility, strength   Functional Limitation Mobility: Walking and moving around   Mobility: Walking and Moving Around Goal Status [phone]) At least 1 percent but less than 20 percent impaired, limited or restricted   Mobility: Walking and Moving Around Discharge Status [phone]) At least 1 percent but less than 20 percent impaired, limited or restricted      Problem List Patient Active Problem List   Diagnosis Date Noted  . Cancer of overlapping sites of left female breast ([place]) 01/23/2016  . S/P right THA, AA 09/04/2015  . History of artificial joint 09/04/2015  . 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 Malignant neoplasm of female breast ([place]) 07/18/2015  . Neoplasm of meninges 07/18/2015  . Breast cancer metastasized to axillary lymph node ([place]) 01/24/2015  . DDD (degenerative disc disease), lumbar 01/02/2015  . HLD (hyperlipidemia) 01/02/2015  . Benign neoplasm of meninges ([place]) 01/02/2015  . Arthritis, degenerative 01/02/2015  . Psoriasis 01/02/2015  . Rotator cuff arthropathy 09/15/2012   Tilman Neat, SPT This entire session was performed under direct supervision and direction of a licensed therapist/therapist assistant . I have personally read, edited and approve of the note as written.  Trotter,Margaret PT, DPT June 12, 2016, 5:59 PM  [place] MAIN [facility] SERVICES [address] [place], [place], [address] Phone: [phone]   Fax:  [phone]  Name: [name] MRN: [id number] Date of Birth: [birth year]

## 2016-05-28 ENCOUNTER — Inpatient Hospital Stay: Payer: Medicare Other

## 2016-05-28 ENCOUNTER — Inpatient Hospital Stay: Payer: Medicare Other | Attending: Internal Medicine

## 2016-05-28 DIAGNOSIS — C50212 Malignant neoplasm of upper-inner quadrant of left female breast: Secondary | ICD-10-CM | POA: Insufficient documentation

## 2016-05-28 DIAGNOSIS — Z171 Estrogen receptor negative status [ER-]: Secondary | ICD-10-CM | POA: Insufficient documentation

## 2016-05-28 DIAGNOSIS — Z452 Encounter for adjustment and management of vascular access device: Secondary | ICD-10-CM | POA: Insufficient documentation

## 2016-05-28 DIAGNOSIS — C773 Secondary and unspecified malignant neoplasm of axilla and upper limb lymph nodes: Secondary | ICD-10-CM | POA: Insufficient documentation

## 2016-05-28 DIAGNOSIS — Z95828 Presence of other vascular implants and grafts: Secondary | ICD-10-CM

## 2016-05-28 MED ORDER — HEPARIN SOD (PORK) LOCK FLUSH 100 UNIT/ML IV SOLN
500.0000 [IU] | Freq: Once | INTRAVENOUS | Status: AC
  Administered 2016-05-28: 500 [IU] via INTRAVENOUS

## 2016-05-28 MED ORDER — SODIUM CHLORIDE 0.9% FLUSH
10.0000 mL | INTRAVENOUS | Status: DC | PRN
  Administered 2016-05-28: 10 mL via INTRAVENOUS
  Filled 2016-05-28: qty 10

## 2016-07-23 ENCOUNTER — Inpatient Hospital Stay: Payer: Medicare Other | Attending: Internal Medicine

## 2016-08-04 ENCOUNTER — Inpatient Hospital Stay: Payer: Medicare Other

## 2016-08-04 ENCOUNTER — Inpatient Hospital Stay: Payer: Medicare Other | Attending: Internal Medicine | Admitting: Internal Medicine

## 2016-08-04 VITALS — BP 131/73 | HR 79 | Temp 98.0°F | Wt 155.0 lb

## 2016-08-04 DIAGNOSIS — Z79899 Other long term (current) drug therapy: Secondary | ICD-10-CM | POA: Insufficient documentation

## 2016-08-04 DIAGNOSIS — C50212 Malignant neoplasm of upper-inner quadrant of left female breast: Secondary | ICD-10-CM | POA: Insufficient documentation

## 2016-08-04 DIAGNOSIS — Z87891 Personal history of nicotine dependence: Secondary | ICD-10-CM | POA: Diagnosis not present

## 2016-08-04 DIAGNOSIS — Z171 Estrogen receptor negative status [ER-]: Secondary | ICD-10-CM | POA: Diagnosis not present

## 2016-08-04 DIAGNOSIS — C50812 Malignant neoplasm of overlapping sites of left female breast: Secondary | ICD-10-CM

## 2016-08-04 DIAGNOSIS — G309 Alzheimer's disease, unspecified: Secondary | ICD-10-CM | POA: Diagnosis not present

## 2016-08-04 DIAGNOSIS — C773 Secondary and unspecified malignant neoplasm of axilla and upper limb lymph nodes: Secondary | ICD-10-CM | POA: Diagnosis not present

## 2016-08-04 DIAGNOSIS — Z96641 Presence of right artificial hip joint: Secondary | ICD-10-CM | POA: Diagnosis not present

## 2016-08-04 DIAGNOSIS — R42 Dizziness and giddiness: Secondary | ICD-10-CM

## 2016-08-04 LAB — CBC WITH DIFFERENTIAL/PLATELET
BASOS ABS: 0 10*3/uL (ref 0–0.1)
Basophils Relative: 0 %
EOS ABS: 0.2 10*3/uL (ref 0–0.7)
EOS PCT: 3 %
HCT: 41.7 % (ref 35.0–47.0)
Hemoglobin: 13.9 g/dL (ref 12.0–16.0)
Lymphocytes Relative: 9 %
Lymphs Abs: 0.5 10*3/uL — ABNORMAL LOW (ref 1.0–3.6)
MCH: 30.1 pg (ref 26.0–34.0)
MCHC: 33.4 g/dL (ref 32.0–36.0)
MCV: 90.2 fL (ref 80.0–100.0)
MONO ABS: 0.4 10*3/uL (ref 0.2–0.9)
Monocytes Relative: 7 %
Neutro Abs: 4.2 10*3/uL (ref 1.4–6.5)
Neutrophils Relative %: 81 %
PLATELETS: 294 10*3/uL (ref 150–440)
RBC: 4.63 MIL/uL (ref 3.80–5.20)
RDW: 14.1 % (ref 11.5–14.5)
WBC: 5.2 10*3/uL (ref 3.6–11.0)

## 2016-08-04 LAB — COMPREHENSIVE METABOLIC PANEL
ALT: 20 U/L (ref 14–54)
AST: 25 U/L (ref 15–41)
Albumin: 4.3 g/dL (ref 3.5–5.0)
Alkaline Phosphatase: 61 U/L (ref 38–126)
Anion gap: 5 (ref 5–15)
BUN: 18 mg/dL (ref 6–20)
CHLORIDE: 101 mmol/L (ref 101–111)
CO2: 30 mmol/L (ref 22–32)
CREATININE: 0.75 mg/dL (ref 0.44–1.00)
Calcium: 8.8 mg/dL — ABNORMAL LOW (ref 8.9–10.3)
GFR calc Af Amer: >60 mL/min (ref >60)
GFR calc non Af Amer: >60 mL/min (ref >60)
Glucose, Bld: 89 mg/dL (ref 65–99)
POTASSIUM: 4 mmol/L (ref 3.5–5.1)
SODIUM: 136 mmol/L (ref 135–145)
Total Bilirubin: 0.6 mg/dL (ref 0.3–1.2)
Total Protein: 7.3 g/dL (ref 6.5–8.1)

## 2016-08-04 NOTE — Progress Notes (Signed)
[place] OFFICE PROGRESS NOTE  Patient Care Team: [name], [name], MD (General Surgery) Self Requested  Breast cancer metastasized to axillary lymph node [facility])   Staging form: Breast, [name], [name], [name], MD on 01/29/2015    Oncology History   # June 2016- LEFT BREAST CA;  [name] ; [name] ; [name]; Her 2 Neu POS; [place] from July OF 2016; [name]; adjuvant Herceptin [ Finished July 2017]; AUG 2017- Neratinib x5 days; DISCON sec to diarrhea  # June 2017- left breast Bx- fat necrosis [[name]  # MUGA scan- July 28th 2017- 67%.  # chronic gait/balance issues  #july 2017-  BRCA 1& 2- NEG.      Breast cancer metastasized to axillary lymph node ([place])   01/24/2015 Initial Diagnosis    Breast cancer metastasized to axillary lymph node       Carcinoma of overlapping sites of left breast in female, estrogen receptor negative ([place])   01/23/2016 Initial Diagnosis    Cancer of overlapping sites of left female breast [facility])        INTERVAL HISTORY:  [name] 76 y.o.  female pleasant patient above history of  Stage I  ER/[name] HER-2/neu positive breast cancer s/p Herceptin. She is here for follow-up.  Patient denies any diarrhea. Denies any nausea vomiting. Appetite is good. No weight loss. Denies any new lumps or bumps.  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 No falls. She walks with cane. States that interim she was diagnosed with mild Alzheimer's. Awaiting to start Namenda through neurology.  Denies any unusual shortness of breath or chest pain. No cough. No swelling in legs.   No headaches or vision changes.  REVIEW OF SYSTEMS:  A complete 10 point review of system is done which is negative except mentioned  above/history of present illness.   PAST MEDICAL HISTORY :  Past Medical History:  Diagnosis Date  . Arthritis   . 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 Breast cancer of upper-inner quadrant of left female breast ([place]) 12/15/14   Completed radiation end of December and finished chemotherapy 2 weeks ago, Left breast invasive mammary carcinoma, T1cN49mc (1.5 cm); Grade 3, [name], [name], [name], .  .Marland KitchenCataract    bilat   . 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 Hearing loss   . History of cancer chemotherapy   . History of radiation therapy   . Imbalance   . Memory impairment    seen by Dr SManuella Ghazi possible post crainiotomy from radiation  . Numbness and tingling    right hand   . Shoulder pain, right   . Wears glasses     PAST SURGICAL HISTORY :   Past Surgical History:  Procedure Laterality Date  . APPENDECTOMY  1950  . BRAIN SURGERY  1995   left frontal/temporal  . BREAST BIOPSY Left 1997  . BREAST BIOPSY Left 12/15/14   confirmed DCIS  . BREAST BIOPSY Left 01/08/2015   Procedure: BREAST BIOPSY WITH NEEDLE LOCALIZATION;  Surgeon: [name] MD;  Location: [facility] ORS;  Service: General;  Laterality: Left;  . BREAST LUMPECTOMY Left  01/08/2015   Procedure: LUMPECTOMY;  Surgeon: [name], MD;  Location: [facility] ORS;  Service: General;  Laterality: Left;  . COLONOSCOPY  2010   Dr. [name]  . PORTACATH PLACEMENT Right 01/16/2015   Procedure: INSERTION PORT-A-CATH;  Surgeon: [name], MD;  Location: [facility] ORS;  Service: General;  Laterality: Right;  . SENTINEL NODE BIOPSY Left 01/16/2015   Procedure: SENTINEL NODE BIOPSY;  Surgeon: [name], MD;  Location: [facility] ORS;  Service: General;  Laterality: Left;  . TOTAL HIP ARTHROPLASTY Right 09/04/2015   Procedure: RIGHT TOTAL HIP ARTHROPLASTY ANTERIOR APPROACH;  Surgeon: [name], MD;  [facility];  Service: Orthopedics;  Laterality: Right;    FAMILY  HISTORY :   Family History  Problem Relation Age of Onset  . Breast cancer Sister 32  . Addison's disease Mother   . Stroke Father     SOCIAL HISTORY:   Social History  Substance Use Topics  . Smoking status: Former Smoker    Packs/day: 0.25    Years: 5.00    Types: Cigarettes    Quit date: 07/28/1972  . Smokeless tobacco: Never Used  . Alcohol use 0.6 - 1.2 oz/week    1 - 2 Glasses of wine per week     Comment: 1 Glass Wine / Night    ALLERGIES:  is allergic to donepezil.  MEDICATIONS:  Current Outpatient Prescriptions  Medication Sig Dispense Refill  . Ascorbic Acid (VITAMIN C) 100 MG tablet Take 100 mg by mouth every morning.     . Calcium Carbonate-Vit D-Min (CALTRATE 600+D PLUS MINERALS) 600-800 MG-UNIT TABS Take 1 tablet by mouth every morning.    . ferrous sulfate 325 (65 FE) MG tablet Take 1 tablet (325 mg total) by mouth 3 (three) times daily after meals.  3  . lidocaine-prilocaine (EMLA) cream Apply 1 application topically as needed. 30 g 3  . loratadine (CLARITIN) 10 MG tablet Take 10 mg by mouth 2 (two) times daily.    . naproxen sodium (ANAPROX) 220 MG tablet Take 220 mg by mouth 2 (two) times daily with a meal.    . polyvinyl alcohol (LIQUIFILM TEARS) 1.4 % ophthalmic solution Place 1 drop into both eyes daily.     . traMADol (ULTRAM) 50 MG tablet Take 50 mg by mouth 2 (two) times daily.     . vitamin E 400 UNIT capsule Take by mouth.     No current facility-administered medications for this visit.     PHYSICAL EXAMINATION: ECOG PERFORMANCE STATUS: 1 - Symptomatic but completely ambulatory  BP 131/73 (BP Location: Left Arm, Patient Position: Sitting)   Pulse 79   Temp 98 F (36.7 C) (Tympanic)   Wt 155 lb (70.3 kg)   BMI 26.61 kg/m   Filed Weights   08/04/16 1156  Weight: 155 lb (70.3 kg)    GENERAL: Well-nourished well-developed; Alert, no distress and comfortable.  With her husband.  Walks with a cane. EYES: no pallor or icterus OROPHARYNX: no  thrush or ulceration; good dentition  NECK: supple, no masses felt LYMPH:  no palpable lymphadenopathy in the cervical, axillary or inguinal regions LUNGS: clear to auscultation and  No wheeze or crackles HEART/CVS: regular rate & rhythm and no murmurs; No lower extremity edema ABDOMEN:abdomen soft, non-tender and normal bowel sounds Musculoskeletal:no cyanosis of digits and no clubbing  PSYCH: alert & oriented x 3 with fluent speech NEURO: no focal motor/sensory deficits SKIN:  no rashes or significant lesions  LABORATORY DATA:  I have reviewed the data as listed    Component Value Date/Time   NA 136 08/04/2016 1027   K 4.0 08/04/2016 1027   CL 101 08/04/2016 1027   CO2 30 08/04/2016 1027   GLUCOSE 89 08/04/2016 1027   BUN 18 08/04/2016 1027   CREATININE 0.75 08/04/2016 1027   CALCIUM 8.8 (L) 08/04/2016 1027   PROT 7.3 08/04/2016 1027   ALBUMIN 4.3 08/04/2016 1027   AST 25 08/04/2016 1027   ALT 20 08/04/2016 1027   ALKPHOS 61 08/04/2016 1027   BILITOT 0.6 08/04/2016 1027   GFRNONAA >60 08/04/2016 1027   GFRAA >60 08/04/2016 1027    No results found for: SPEP, UPEP  Lab Results  Component Value Date   WBC 5.2 08/04/2016   NEUTROABS 4.2 08/04/2016   HGB 13.9 08/04/2016   HCT 41.7 08/04/2016   MCV 90.2 08/04/2016   PLT 294 08/04/2016      Chemistry      Component Value Date/Time   NA 136 08/04/2016 1027   K 4.0 08/04/2016 1027   CL 101 08/04/2016 1027   CO2 30 08/04/2016 1027   BUN 18 08/04/2016 1027   CREATININE 0.75 08/04/2016 1027      Component Value Date/Time   CALCIUM 8.8 (L) 08/04/2016 1027   ALKPHOS 61 08/04/2016 1027   AST 25 08/04/2016 1027   ALT 20 08/04/2016 1027   BILITOT 0.6 08/04/2016 1027     No  focal wall motion abnormality of the left ventricle. Calculated left ventricular ejection fraction equals 67%. Previously 61% on 11/13/2015 IMPRESSION: Left ventricular ejection fraction equals 67 %. Electronically Signed   By: Suzy Bouchard M.D.   On: 02/20/2016 15:02  RADIOGRAPHIC STUDIES: I have personally reviewed the radiological images as listed and agreed with the findings in the report. No results found.   ASSESSMENT & PLAN:  Carcinoma of overlapping sites of left breast in female, estrogen receptor negative ([place]) # invasive carcinoma of breast T1c n1MIC M0; Estrogen receptor negative.  Progesterone receptor negative.  HER-2/neu receptor positive. S/p- 14m/kg [finished July 2017]; clinically no evidence of disease. Currently on surveillance.  # ? Early alzeihmer's/ # vertigo--/balance issues-  Chronic not any worse. following with Neurology.   # port flush- every 8 weeks/ wants to keep it in for now.   #  mammo- in May 2018. Follow up in 4th week of may 2018.   Orders Placed This Encounter  Procedures  . MM Digital Diagnostic Bilat    Standing Status:   Future    Standing Expiration Date:   08/04/2017    Order Specific Question:   Reason for Exam (SYMPTOM  OR DIAGNOSIS REQUIRED)    Answer:   left breast cancer    Order Specific Question:   Preferred imaging location?    Answer:   [facility]  . [facility]    Standing Status:   Future    Standing Expiration Date:   10/02/2017    Order Specific Question:   Reason for Exam (SYMPTOM  OR DIAGNOSIS REQUIRED)    Answer:   left breast cancer    Order Specific Question:   Preferred imaging location?    Answer:   [facility]  . [facility] [place]    Standing Status:   Future    Standing Expiration Date:   10/02/2017    Order Specific Question:   Reason for Exam (SYMPTOM  OR DIAGNOSIS  REQUIRED)    Answer:   left breast cancer    Order Specific Question:   Preferred imaging location?    Answer:   [facility]  . CBC with Differential    Standing Status:   Future    Standing Expiration Date:   08/04/2017  . Comprehensive metabolic panel    Standing Status:   Future    Standing Expiration Date:    08/04/2017   All questions were answered. The patient knows to call the clinic with any problems, questions or concerns.      Cammie Sickle, MD 08/05/2016 8:37 AM

## 2016-08-04 NOTE — Progress Notes (Signed)
Patient here today for follow up.  Needs to discuss new options for treatment that was suggested by neurologist Dr Brigitte Pulse

## 2016-08-04 NOTE — Assessment & Plan Note (Addendum)
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  Progesterone receptor negative.  HER-2/neu receptor positive. S/p- 149m/kg [finished July 2017]; clinically no evidence of disease. Currently on surveillance.  # ? Early alzeihmer's/ # vertigo--/balance issues-  Chronic not any worse. following with Neurology.   # port flush- every 8 weeks/ wants to keep it in for now.   #  mammo- in May 2018. Follow up in 4th week of may 2018.

## 2016-08-27 ENCOUNTER — Other Ambulatory Visit: Payer: Self-pay | Admitting: Obstetrics & Gynecology

## 2016-08-27 DIAGNOSIS — N644 Mastodynia: Secondary | ICD-10-CM

## 2016-08-27 DIAGNOSIS — Z853 Personal history of malignant neoplasm of breast: Secondary | ICD-10-CM

## 2016-08-28 ENCOUNTER — Encounter: Payer: Self-pay | Admitting: Radiation Oncology

## 2016-08-28 ENCOUNTER — Ambulatory Visit
Admission: RE | Admit: 2016-08-28 | Discharge: 2016-08-28 | Disposition: A | Discharge status: Home / Self Care | Payer: Medicare Other | Source: Ambulatory Visit | Attending: Radiation Oncology | Admitting: Radiation Oncology

## 2016-08-28 VITALS — BP 132/71 | HR 73 | Temp 96.6°F | Resp 20 | Wt 156.3 lb

## 2016-08-28 DIAGNOSIS — Z853 Personal history of malignant neoplasm of breast: Secondary | ICD-10-CM | POA: Diagnosis not present

## 2016-08-28 DIAGNOSIS — N641 Fat necrosis of breast: Secondary | ICD-10-CM | POA: Insufficient documentation

## 2016-08-28 DIAGNOSIS — R42 Dizziness and giddiness: Secondary | ICD-10-CM | POA: Diagnosis not present

## 2016-08-28 DIAGNOSIS — F0280 Dementia in other diseases classified elsewhere without behavioral disturbance: Secondary | ICD-10-CM | POA: Diagnosis not present

## 2016-08-28 DIAGNOSIS — C773 Secondary and unspecified malignant neoplasm of axilla and upper limb lymph nodes: Secondary | ICD-10-CM

## 2016-08-28 DIAGNOSIS — C50912 Malignant neoplasm of unspecified site of left female breast: Secondary | ICD-10-CM

## 2016-08-28 DIAGNOSIS — Z923 Personal history of irradiation: Secondary | ICD-10-CM | POA: Diagnosis not present

## 2016-08-28 DIAGNOSIS — G309 Alzheimer's disease, unspecified: Secondary | ICD-10-CM | POA: Diagnosis not present

## 2016-08-28 NOTE — Progress Notes (Signed)
Radiation Oncology Follow up Note  Name: [name]   Date:   08/28/2016 MRN:  [id number] DOB: 12/23/1940    This 75 y.o. 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 She is seen today in routine follow-up and is doing well she status some point tenderness in the left breast which is being followed no evidence of mass or nodularity is noted. She did have a rebiopsy of the left breast which showed fat necrosis.. Patient continues to have some vertigo and mild Alzheimer's she has a history of pituitary radiation close to 25 years prior at [facility]. Her last mammograms were back in May 2017 and were BI-RADS 1 benign  COMPLICATIONS OF TREATMENT: none  FOLLOW UP COMPLIANCE: keeps appointments   PHYSICAL EXAM:  BP 132/71   Pulse 73   Temp (!) 96.6 F (35.9 C)   Resp 20   Wt 156 lb 4.9 oz (70.9 kg)   BMI 26.83 kg/m  Lungs are clear to A&P cardiac examination essentially unremarkable with regular rate and rhythm. No dominant mass or nodularity is noted in either breast in 2 positions examined. Incision is well-healed. No axillary or supraclavicular adenopathy is appreciated. Cosmetic result is excellent.. I can detect no evidence of mass or nodularity in the area she is concerned with pain. Again this may relate to the area of fat necrosis. Well-developed well-nourished patient in NAD. HEENT reveals PERLA, EOMI, discs not visualized.  Oral cavity is clear. No oral mucosal lesions are identified. Neck is clear without evidence of cervical or supraclavicular adenopathy. Lungs are clear  to A&P. Cardiac examination is essentially unremarkable with regular rate and rhythm without murmur rub or thrill. Abdomen is benign with no organomegaly or masses noted. Motor sensory and DTR levels are equal and symmetric in the upper and lower extremities. Cranial nerves II through XII are grossly intact. Proprioception is intact. No peripheral adenopathy or edema is identified. No motor or sensory levels are noted. Crude visual fields are within normal range.  RADIOLOGY RESULTS: Previous mammograms reviewed compatible above-stated findings  PLAN: At this time am please were overall progress I've asked to see her back in 1 year for follow-up. She or he has follow-up mammograms and ultrasound scheduled. She is not on antiestrogen therapy. Patient knows to call with any concerns.  I would like to take this opportunity to thank you for allowing me to participate in the care of your patient..    , S., MD   

## 2016-09-08 ENCOUNTER — Ambulatory Visit
Admission: RE | Admit: 2016-09-08 | Discharge: 2016-09-08 | Disposition: A | Discharge status: Home / Self Care | Payer: Medicare Other | Source: Ambulatory Visit | Attending: Obstetrics & Gynecology | Admitting: Obstetrics & Gynecology

## 2016-09-08 DIAGNOSIS — N644 Mastodynia: Secondary | ICD-10-CM | POA: Insufficient documentation

## 2016-09-08 DIAGNOSIS — Z9889 Other specified postprocedural states: Secondary | ICD-10-CM | POA: Diagnosis not present

## 2016-09-08 DIAGNOSIS — Z853 Personal history of malignant neoplasm of breast: Secondary | ICD-10-CM | POA: Insufficient documentation

## 2016-09-16 ENCOUNTER — Inpatient Hospital Stay: Payer: Medicare Other | Attending: Internal Medicine

## 2016-09-16 ENCOUNTER — Inpatient Hospital Stay: Payer: Medicare Other

## 2016-09-16 DIAGNOSIS — Z452 Encounter for adjustment and management of vascular access device: Secondary | ICD-10-CM | POA: Insufficient documentation

## 2016-09-16 DIAGNOSIS — C773 Secondary and unspecified malignant neoplasm of axilla and upper limb lymph nodes: Secondary | ICD-10-CM | POA: Diagnosis not present

## 2016-09-16 DIAGNOSIS — Z95828 Presence of other vascular implants and grafts: Secondary | ICD-10-CM

## 2016-09-16 DIAGNOSIS — Z171 Estrogen receptor negative status [ER-]: Secondary | ICD-10-CM | POA: Diagnosis not present

## 2016-09-16 DIAGNOSIS — C50212 Malignant neoplasm of upper-inner quadrant of left female breast: Secondary | ICD-10-CM | POA: Insufficient documentation

## 2016-09-16 MED ORDER — HEPARIN SOD (PORK) LOCK FLUSH 100 UNIT/ML IV SOLN
500.0000 [IU] | Freq: Once | INTRAVENOUS | Status: AC
  Administered 2016-09-16: 500 [IU] via INTRAVENOUS

## 2016-09-16 MED ORDER — SODIUM CHLORIDE 0.9% FLUSH
10.0000 mL | INTRAVENOUS | Status: DC | PRN
  Administered 2016-09-16: 10 mL via INTRAVENOUS
  Filled 2016-09-16: qty 10

## 2016-09-16 NOTE — Progress Notes (Signed)
Survivorship Care Plan visit completed.  Treatment summary reviewed and given to patient.  ASCO answers booklet reviewed and given to patient.  CARE program and Cancer Transitions discussed with patient along with other resources cancer center offers to patients and caregivers.  Patient verbalized understanding.    

## 2016-09-25 ENCOUNTER — Other Ambulatory Visit: Payer: Self-pay

## 2016-10-28 ENCOUNTER — Inpatient Hospital Stay: Payer: Medicare Other | Attending: Radiation Oncology

## 2016-10-28 DIAGNOSIS — Z452 Encounter for adjustment and management of vascular access device: Secondary | ICD-10-CM | POA: Diagnosis not present

## 2016-10-28 DIAGNOSIS — C773 Secondary and unspecified malignant neoplasm of axilla and upper limb lymph nodes: Secondary | ICD-10-CM | POA: Insufficient documentation

## 2016-10-28 DIAGNOSIS — C50212 Malignant neoplasm of upper-inner quadrant of left female breast: Secondary | ICD-10-CM | POA: Insufficient documentation

## 2016-10-28 DIAGNOSIS — Z95828 Presence of other vascular implants and grafts: Secondary | ICD-10-CM

## 2016-10-28 DIAGNOSIS — Z171 Estrogen receptor negative status [ER-]: Secondary | ICD-10-CM | POA: Insufficient documentation

## 2016-10-28 MED ORDER — HEPARIN SOD (PORK) LOCK FLUSH 100 UNIT/ML IV SOLN
500.0000 [IU] | Freq: Once | INTRAVENOUS | Status: AC
  Administered 2016-10-28: 500 [IU] via INTRAVENOUS

## 2016-10-28 MED ORDER — SODIUM CHLORIDE 0.9% FLUSH
10.0000 mL | INTRAVENOUS | Status: DC | PRN
  Administered 2016-10-28: 10 mL via INTRAVENOUS
  Filled 2016-10-28: qty 10

## 2016-12-24 ENCOUNTER — Other Ambulatory Visit: Payer: Medicare Other

## 2016-12-24 ENCOUNTER — Ambulatory Visit: Payer: Medicare Other

## 2016-12-25 ENCOUNTER — Ambulatory Visit
Admission: RE | Admit: 2016-12-25 | Discharge: 2016-12-25 | Disposition: A | Discharge status: Home / Self Care | Payer: Medicare Other | Source: Ambulatory Visit | Attending: Internal Medicine | Admitting: Internal Medicine

## 2016-12-25 DIAGNOSIS — C50812 Malignant neoplasm of overlapping sites of left female breast: Secondary | ICD-10-CM | POA: Diagnosis not present

## 2016-12-25 DIAGNOSIS — Z171 Estrogen receptor negative status [ER-]: Secondary | ICD-10-CM | POA: Diagnosis not present

## 2016-12-25 HISTORY — DX: Malignant neoplasm of unspecified site of unspecified female breast: C50.919

## 2016-12-29 ENCOUNTER — Encounter: Payer: Self-pay | Admitting: *Deleted

## 2016-12-31 ENCOUNTER — Ambulatory Visit: Payer: Medicare Other | Admitting: General Surgery

## 2017-01-01 ENCOUNTER — Ambulatory Visit (INDEPENDENT_AMBULATORY_CARE_PROVIDER_SITE_OTHER): Payer: Medicare Other | Admitting: General Surgery

## 2017-01-01 ENCOUNTER — Encounter: Payer: Self-pay | Admitting: General Surgery

## 2017-01-01 VITALS — BP 114/64 | HR 90 | Resp 16 | Ht 64.0 in | Wt 141.0 lb

## 2017-01-01 DIAGNOSIS — C50912 Malignant neoplasm of unspecified site of left female breast: Secondary | ICD-10-CM

## 2017-01-01 DIAGNOSIS — C773 Secondary and unspecified malignant neoplasm of axilla and upper limb lymph nodes: Secondary | ICD-10-CM

## 2017-01-01 NOTE — Progress Notes (Signed)
Patient ID: [name], female   DOB: [birth year], 76 y.o.   MRN: [id number]  Chief Complaint  Patient presents with  . Follow-up    mammogram     HPI [name] is a 76 y.o. female who presents for a breast evaluation. The most recent mammogram was done on 12/24/2016 .  Patient does perform regular self breast checks and gets regular mammograms done.   Patient fell down last week and hit her left arm.  Husband, Dr. Tillman Sers present at visit.   HPI  Past Medical History:  Diagnosis Date  . Arthritis   . 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 Breast cancer ([place]) 2016  . 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  .Marland KitchenCataract    bilat   . 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 Hearing loss   . History of cancer chemotherapy   . History of radiation therapy   . Imbalance   . Memory impairment    seen by Dr SManuella Ghazi possible post crainiotomy from radiation  . Numbness and tingling    right hand   . Shoulder pain, right   . Wears glasses     Past Surgical History:  Procedure Laterality Date  . APPENDECTOMY  1950  . BRAIN SURGERY  1995   left frontal/temporal  . BREAST BIOPSY Left 1997  . BREAST BIOPSY Left 12/15/14   confirmed DCIS  . BREAST BIOPSY Left 01/08/2015   Procedure: BREAST BIOPSY WITH NEEDLE LOCALIZATION;  Surgeon: [name] MD;  Location: [facility] ORS;  Service: General;  Laterality: Left;  . BREAST LUMPECTOMY Left 01/08/2015   Procedure: LUMPECTOMY;  Surgeon: [name] MD;  Location: [facility] ORS;  Service: General;  Laterality: Left;  . COLONOSCOPY  2010   Dr. ETiffany Kocher . PORTACATH PLACEMENT Right 01/16/2015   Procedure: INSERTION PORT-A-CATH;  Surgeon: [name] MD;  Location: [facility]  ORS;  Service: General;  Laterality: Right;  . SENTINEL NODE BIOPSY Left 01/16/2015   Procedure: SENTINEL NODE BIOPSY;  Surgeon: [name] MD;  Location: [facility] ORS;  Service: General;  Laterality: Left;  . TOTAL HIP ARTHROPLASTY Right 09/04/2015   Procedure: RIGHT TOTAL HIP ARTHROPLASTY ANTERIOR APPROACH;  Surgeon: MParalee Cancel MD;  Location: [facility];  Service: Orthopedics;  Laterality: Right;    Family History  Problem Relation Age of Onset  . Breast cancer Sister 440 . Addison's disease Mother   . Stroke Father     Social History Social History  Substance Use Topics  . Smoking status: Former Smoker    Packs/day: 0.25    Years: 5.00    Types: Cigarettes    Quit date: 07/28/1972  . Smokeless tobacco: Never Used  . Alcohol use 0.6 - 1.2 oz/week    1 - 2 Glasses of wine per week     Comment: 1 Glass Wine / Night    Allergies  Allergen Reactions  . Donepezil     Other reaction(s): Other (See Comments) Nightmares    Current Outpatient Prescriptions  Medication Sig Dispense Refill  . Ascorbic Acid (VITAMIN C) 100 MG tablet Take 100 mg by mouth every morning.     . Calcium Carbonate-Vit  D-Min (CALTRATE 600+D PLUS MINERALS) 600-800 MG-UNIT TABS Take 1 tablet by mouth every morning.    . ferrous sulfate 325 (65 FE) MG tablet Take 1 tablet (325 mg total) by mouth 3 (three) times daily after meals.  3  . lidocaine-prilocaine (EMLA) cream Apply 1 application topically as needed. 30 g 3  . loratadine (CLARITIN) 10 MG tablet Take 10 mg by mouth 2 (two) times daily.    . naproxen sodium (ANAPROX) 220 MG tablet Take 220 mg by mouth 2 (two) times daily with a meal.    . polyvinyl alcohol (LIQUIFILM TEARS) 1.4 % ophthalmic solution Place 1 drop into both eyes daily.     . traMADol (ULTRAM) 50 MG tablet Take 50 mg by mouth as needed.     . vitamin E 400 UNIT capsule Take by mouth.     No current facility-administered medications for this visit.     Review of Systems Review of  Systems  Constitutional: Negative.   Respiratory: Negative.   Cardiovascular: Negative.     Blood pressure 114/64, pulse 90, resp. rate 16, height 5' 4"  (1.626 m), weight 141 lb (64 kg).  Physical Exam Physical Exam  Constitutional: She is oriented to person, place, and time. She appears well-developed and well-nourished.  Eyes: Conjunctivae are normal. No scleral icterus.  Neck: Neck supple.  Cardiovascular: Normal rate, regular rhythm and normal heart sounds.   Pulmonary/Chest: Effort normal and breath sounds normal. Right breast exhibits no inverted nipple, no mass, no nipple discharge, no skin change and no tenderness. Left breast exhibits no inverted nipple, no mass, no nipple discharge, no skin change and no tenderness.    Well healed incision left breast.   Lymphadenopathy:    She has no cervical adenopathy.    She has no axillary adenopathy.       Right: No supraclavicular adenopathy present.       Left: No supraclavicular adenopathy present.  Neurological: She is alert and oriented to person, place, and time.  Skin: Skin is warm and dry.  Vitals reviewed.   Data Reviewed Bilateral mammograms 12/25/2016 reviewed. Postsurgical/radiation changes. Ultrasound completed at radiologist's discretion unremarkable. BI-RADS-2.  Assessment    No evidence of recurrent breast cancer.    Plan       The patient has been asked to return to the office in one year   HPI, Physical Exam, Assessment and Plan have been scribed under the direction and in the presence of Hervey Ard, MD.  Gaspar Cola, CMA  I have completed the exam and reviewed the above documentation for accuracy and completeness.  I agree with the above.  Haematologist has been used and any errors in dictation or transcription are unintentional.  Hervey Ard, M.D., F.A.C.S.  [name] 01/03/2017, 7:42 AM

## 2017-01-01 NOTE — Patient Instructions (Addendum)
The patient has been asked to return to the office in one year.

## 2017-01-04 ENCOUNTER — Observation Stay
Admission: EM | Admit: 2017-01-04 | Discharge: 2017-01-05 | Disposition: A | Discharge status: Home / Self Care | Payer: Medicare Other | Attending: Internal Medicine | Admitting: Internal Medicine

## 2017-01-04 ENCOUNTER — Emergency Department: Payer: Medicare Other

## 2017-01-04 DIAGNOSIS — Z923 Personal history of irradiation: Secondary | ICD-10-CM | POA: Insufficient documentation

## 2017-01-04 DIAGNOSIS — Z888 Allergy status to other drugs, medicaments and biological substances status: Secondary | ICD-10-CM | POA: Diagnosis not present

## 2017-01-04 DIAGNOSIS — Z87891 Personal history of nicotine dependence: Secondary | ICD-10-CM | POA: Diagnosis not present

## 2017-01-04 DIAGNOSIS — D638 Anemia in other chronic diseases classified elsewhere: Secondary | ICD-10-CM | POA: Insufficient documentation

## 2017-01-04 DIAGNOSIS — Z823 Family history of stroke: Secondary | ICD-10-CM | POA: Insufficient documentation

## 2017-01-04 DIAGNOSIS — M1990 Unspecified osteoarthritis, unspecified site: Secondary | ICD-10-CM | POA: Diagnosis not present

## 2017-01-04 DIAGNOSIS — Z853 Personal history of malignant neoplasm of breast: Secondary | ICD-10-CM | POA: Diagnosis not present

## 2017-01-04 DIAGNOSIS — Z9221 Personal history of antineoplastic chemotherapy: Secondary | ICD-10-CM | POA: Diagnosis not present

## 2017-01-04 DIAGNOSIS — G301 Alzheimer's disease with late onset: Secondary | ICD-10-CM | POA: Insufficient documentation

## 2017-01-04 DIAGNOSIS — G9389 Other specified disorders of brain: Secondary | ICD-10-CM | POA: Diagnosis not present

## 2017-01-04 DIAGNOSIS — E785 Hyperlipidemia, unspecified: Secondary | ICD-10-CM | POA: Insufficient documentation

## 2017-01-04 DIAGNOSIS — F0280 Dementia in other diseases classified elsewhere without behavioral disturbance: Secondary | ICD-10-CM | POA: Insufficient documentation

## 2017-01-04 DIAGNOSIS — Z8589 Personal history of malignant neoplasm of other organs and systems: Secondary | ICD-10-CM | POA: Diagnosis not present

## 2017-01-04 DIAGNOSIS — R011 Cardiac murmur, unspecified: Secondary | ICD-10-CM | POA: Diagnosis not present

## 2017-01-04 DIAGNOSIS — L409 Psoriasis, unspecified: Secondary | ICD-10-CM | POA: Insufficient documentation

## 2017-01-04 DIAGNOSIS — Z96641 Presence of right artificial hip joint: Secondary | ICD-10-CM | POA: Insufficient documentation

## 2017-01-04 DIAGNOSIS — Z803 Family history of malignant neoplasm of breast: Secondary | ICD-10-CM | POA: Diagnosis not present

## 2017-01-04 DIAGNOSIS — R55 Syncope and collapse: Secondary | ICD-10-CM | POA: Diagnosis not present

## 2017-01-04 DIAGNOSIS — Z86011 Personal history of benign neoplasm of the brain: Secondary | ICD-10-CM | POA: Diagnosis not present

## 2017-01-04 LAB — CBC
HEMATOCRIT: 41.4 % (ref 35.0–47.0)
Hemoglobin: 13.9 g/dL (ref 12.0–16.0)
MCH: 30.7 pg (ref 26.0–34.0)
MCHC: 33.6 g/dL (ref 32.0–36.0)
MCV: 91.6 fL (ref 80.0–100.0)
PLATELETS: 310 10*3/uL (ref 150–440)
RBC: 4.52 MIL/uL (ref 3.80–5.20)
RDW: 14.3 % (ref 11.5–14.5)
WBC: 5.7 10*3/uL (ref 3.6–11.0)

## 2017-01-04 LAB — COMPREHENSIVE METABOLIC PANEL
ALT: 18 U/L (ref 14–54)
ANION GAP: 7 (ref 5–15)
AST: 23 U/L (ref 15–41)
Albumin: 4.1 g/dL (ref 3.5–5.0)
Alkaline Phosphatase: 55 U/L (ref 38–126)
BILIRUBIN TOTAL: 0.6 mg/dL (ref 0.3–1.2)
BUN: 19 mg/dL (ref 6–20)
CHLORIDE: 102 mmol/L (ref 101–111)
CO2: 31 mmol/L (ref 22–32)
Calcium: 9.2 mg/dL (ref 8.9–10.3)
Creatinine, Ser: 0.87 mg/dL (ref 0.44–1.00)
Glucose, Bld: 116 mg/dL — ABNORMAL HIGH (ref 65–99)
POTASSIUM: 3.7 mmol/L (ref 3.5–5.1)
Sodium: 140 mmol/L (ref 135–145)
TOTAL PROTEIN: 7.1 g/dL (ref 6.5–8.1)

## 2017-01-04 LAB — URINALYSIS, COMPLETE (UACMP) WITH MICROSCOPIC
BACTERIA UA: NONE SEEN
Bilirubin Urine: NEGATIVE
GLUCOSE, UA: NEGATIVE mg/dL
Hgb urine dipstick: NEGATIVE
KETONES UR: NEGATIVE mg/dL
LEUKOCYTES UA: NEGATIVE
NITRITE: NEGATIVE
PH: 7 (ref 5.0–8.0)
PROTEIN: NEGATIVE mg/dL
Specific Gravity, Urine: 1.013 (ref 1.005–1.030)

## 2017-01-04 LAB — TROPONIN I

## 2017-01-04 MED ORDER — POLYVINYL ALCOHOL 1.4 % OP SOLN
1.0000 [drp] | OPHTHALMIC | Status: DC | PRN
  Filled 2017-01-04: qty 15

## 2017-01-04 MED ORDER — VITAMIN C 500 MG/5ML PO SYRP
100.0000 mg | ORAL_SOLUTION | Freq: Every morning | ORAL | Status: DC
  Filled 2017-01-04: qty 1

## 2017-01-04 MED ORDER — SODIUM CHLORIDE 0.9% FLUSH: 3.0000 mL | INTRAVENOUS | Status: DC | PRN

## 2017-01-04 MED ORDER — SODIUM CHLORIDE 0.9 % IV SOLN: 250.0000 mL | INTRAVENOUS | Status: DC | PRN

## 2017-01-04 MED ORDER — ENOXAPARIN SODIUM 40 MG/0.4ML ~~LOC~~ SOLN: 40.0000 mg | SUBCUTANEOUS | Status: DC

## 2017-01-04 MED ORDER — CALCIUM CARBONATE-VITAMIN D 500-200 MG-UNIT PO TABS
1.0000 | ORAL_TABLET | Freq: Every morning | ORAL | Status: DC
  Administered 2017-01-05: 11:00:00 1 via ORAL
  Filled 2017-01-04: qty 1

## 2017-01-04 MED ORDER — FERROUS SULFATE 325 (65 FE) MG PO TABS
325.0000 mg | ORAL_TABLET | Freq: Three times a day (TID) | ORAL | Status: DC
  Administered 2017-01-04 – 2017-01-05 (×3): 325 mg via ORAL
  Filled 2017-01-04 (×3): qty 1

## 2017-01-04 MED ORDER — LORATADINE 10 MG PO TABS: 10.0000 mg | ORAL_TABLET | Freq: Every day | ORAL | Status: DC | PRN

## 2017-01-04 MED ORDER — SODIUM CHLORIDE 0.9% FLUSH
3.0000 mL | Freq: Two times a day (BID) | INTRAVENOUS | Status: DC
  Administered 2017-01-04 – 2017-01-05 (×3): 3 mL via INTRAVENOUS

## 2017-01-04 NOTE — ED Triage Notes (Addendum)
Patient was at church today when she had a syncopal episode. VSS with EMS, CBG 147. Patient was noted to have a new prescription of Namenda that was recently decreased in dosing per PCP. Husband is Dr. Tobe Sos. Husband witnessed a fall last Saturday from which the patient made no attempt to catch herself and sustained a head injury. He states "she just checked out and went down"  Husband described a similar episode this morning.  Husband notes a period of limited-responsivness (alert but not verbal or responding to stimuli appropriately) after each fall.

## 2017-01-04 NOTE — ED Notes (Signed)
Pt transported to room 124 

## 2017-01-04 NOTE — H&P (Signed)
[name] is an 76 y.o. female.   Chief Complaint: Passing out HPI: This is a 76 year old female presents after passing out at church earlier today. Her son who is with her and witnessed the event his ophthalmologist. He said this started last Saturday thereafter riding her exercise bike and eating breakfast she suddenly fell forward and passed out. He said that that episode she was awake and talking within seconds. Recently before that event Dr. Brigitte Pulse her neurologist had increased her dose of Namenda. He was contacted and since lowered the dose back to the starting dose. Today while at church after standing for a little bit of the patient collapsed between diffuse. Thicker little bit longer to wake up but she was completely back to normal afterwards. She was brought to the hospital. She did not complain of any chest pain headache or other symptoms.  Past Medical History:  Diagnosis Date  . Arthritis   . 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 Breast cancer ([place]) 2016  . 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  .Marland KitchenCataract    bilat   . 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 Hearing loss   . History of cancer chemotherapy   . History of radiation therapy   . Imbalance   . Memory impairment    seen by Dr SManuella Ghazi possible post crainiotomy from radiation  . Numbness and tingling    right hand   . Shoulder pain, right   . Wears glasses     Past Surgical History:  Procedure Laterality Date  . APPENDECTOMY  1950  . BRAIN SURGERY  1995   left frontal/temporal  . BREAST BIOPSY Left 1997  . BREAST BIOPSY Left 12/15/14   confirmed DCIS  . BREAST BIOPSY Left 01/08/2015   Procedure: BREAST BIOPSY WITH NEEDLE LOCALIZATION;  Surgeon: [name] MD;  Location: [facility] ORS;  Service: General;  Laterality: Left;  . BREAST LUMPECTOMY Left 01/08/2015   Procedure: LUMPECTOMY;  Surgeon: [name] MD;  Location: [facility] ORS;  Service: General;  Laterality: Left;  . COLONOSCOPY  2010   Dr. ETiffany Kocher . PORTACATH PLACEMENT Right 01/16/2015   Procedure: INSERTION PORT-A-CATH;  Surgeon: [name] MD;  Location: [facility] ORS;  Service: General;  Laterality: Right;  . SENTINEL NODE BIOPSY Left 01/16/2015   Procedure: SENTINEL NODE BIOPSY;  Surgeon: [name] MD;  Location: [facility] ORS;  Service: General;  Laterality: Left;  . TOTAL HIP ARTHROPLASTY Right 09/04/2015   Procedure: RIGHT TOTAL HIP ARTHROPLASTY ANTERIOR APPROACH;  Surgeon: MParalee Cancel MD;  Location: [facility];  Service: Orthopedics;  Laterality: Right;    Family History  Problem Relation Age of Onset  . Breast cancer Sister 464 . Addison's disease Mother   . Stroke Father    Social History:  reports that she quit smoking about 44 years ago. Her smoking use included Cigarettes. She has a 1.25 pack-year smoking history. She has never used smokeless tobacco. She reports that she drinks about 0.6 - 1.2 oz of alcohol per week . She reports that she does not use drugs.  Allergies:  Allergies  Allergen Reactions  . Donepezil     Other reaction(s): Other (  See Comments) Nightmares     (Not in a hospital admission)  Results for orders placed or performed during the hospital encounter of 01/04/17 (from the past 48 hour(s))  CBC     Status: None   Collection Time: 01/04/17  9:36 AM  Result Value Ref Range   WBC 5.7 3.6 - 11.0 K/uL   RBC 4.52 3.80 - 5.20 MIL/uL   Hemoglobin 13.9 12.0 - 16.0 g/dL   HCT 41.4 35.0 - 47.0 %   MCV 91.6 80.0 - 100.0 fL   MCH 30.7 26.0 - 34.0 pg   MCHC 33.6 32.0 - 36.0 g/dL   RDW 14.3 11.5 - 14.5 %   Platelets 310 150 - 440 K/uL  Comprehensive metabolic panel     Status: Abnormal   Collection Time: 01/04/17  9:36 AM  Result Value Ref  Range   Sodium 140 135 - 145 mmol/L   Potassium 3.7 3.5 - 5.1 mmol/L   Chloride 102 101 - 111 mmol/L   CO2 31 22 - 32 mmol/L   Glucose, Bld 116 (H) 65 - 99 mg/dL   BUN 19 6 - 20 mg/dL   Creatinine, Ser 0.87 0.44 - 1.00 mg/dL   Calcium 9.2 8.9 - 10.3 mg/dL   Total Protein 7.1 6.5 - 8.1 g/dL   Albumin 4.1 3.5 - 5.0 g/dL   AST 23 15 - 41 U/L   ALT 18 14 - 54 U/L   Alkaline Phosphatase 55 38 - 126 U/L   Total Bilirubin 0.6 0.3 - 1.2 mg/dL   GFR calc non Af Amer >60 >60 mL/min   GFR calc Af Amer >60 >60 mL/min    Comment: (NOTE) The eGFR has been calculated using the CKD EPI equation. This calculation has not been validated in all clinical situations. eGFR's persistently <60 mL/min signify possible Chronic Kidney Disease.    Anion gap 7 5 - 15  Troponin I     Status: None   Collection Time: 01/04/17  9:36 AM  Result Value Ref Range   Troponin I <0.03 <0.03 ng/mL   Ct Head Wo Contrast  Result Date: 01/04/2017 CLINICAL DATA:  76 year old female with syncopal episode at church. Similar syncopal episode with fall 1 week ago. Left frontotemporal craniotomy in the 1990s for meningioma. EXAM: CT HEAD WITHOUT CONTRAST TECHNIQUE: Contiguous axial images were obtained from the base of the skull through the vertex without intravenous contrast. COMPARISON:  Brain MRI 09/08/2014 and earlier. FINDINGS: Brain: Stable cerebral volume since 2016. Chronic dystrophic calcifications in the brainstem were evident on prior T2* MRI imaging. Patchy bilateral chronic cerebral white matter hypodensity. Small area of chronic encephalomalacia at the left anterior temporal lobe. No midline shift, ventriculomegaly, mass effect, evidence of mass lesion, intracranial hemorrhage or evidence of cortically based acute infarction. Vascular: Calcified atherosclerosis at the skull base. No suspicious intracranial vascular hyperdensity. Skull: Remote left frontotemporal craniotomy changes. No acute osseous abnormality  identified. Sinuses/Orbits: Visualized paranasal sinuses and mastoids are stable and well pneumatized. Other: No acute orbit or scalp soft tissue findings. IMPRESSION: 1. No acute intracranial abnormality. No acute traumatic injury identified. 2. Noncontrast CT appearance of the brain appears stable to the 2016 MRI; chronic postoperative encephalomalacia at the left anterior temporal lobe, chronic dystrophic calcifications in the brainstem, and chronic bilateral white matter changes most likely due to small vessel disease. Electronically Signed   By: Genevie Ann M.D.   On: 01/04/2017 10:12    Review of Systems  Constitutional: Negative for chills  and fever.  HENT: Negative for hearing loss.   Eyes: Negative for blurred vision.  Respiratory: Negative for cough.   Cardiovascular: Negative for chest pain.  Gastrointestinal: Negative for nausea and vomiting.  Genitourinary: Negative for dysuria.  Musculoskeletal: Negative for joint pain.  Skin: Negative for rash.  Neurological: Negative for dizziness.    Blood pressure 111/62, pulse 71, temperature 97.6 F (36.4 C), temperature source Oral, resp. rate 19, height 5' 4"  (1.626 m), weight 64 kg (141 lb), SpO2 100 %. Physical Exam  Constitutional: She is oriented to person, place, and time. She appears well-developed and well-nourished. No distress.  HENT:  Head: Normocephalic and atraumatic.  Mouth/Throat: Oropharynx is clear and moist. No oropharyngeal exudate.  Eyes: Pupils are equal, round, and reactive to light. No scleral icterus.  Neck: Neck supple. No JVD present. No tracheal deviation present. No thyromegaly present.  Cardiovascular: Normal rate and regular rhythm.   Murmur heard. Respiratory: Breath sounds normal. No respiratory distress. She has no wheezes. She has no rales. She exhibits no tenderness.  GI: Soft. Bowel sounds are normal. She exhibits no distension. There is no tenderness. There is no rebound and no guarding.   Musculoskeletal: She exhibits no edema.  Neurological: She is alert and oriented to person, place, and time. No cranial nerve deficit.  Skin: Skin is warm and dry.     Assessment/Plan 1. Syncope. Patient had 2 episodes in 1 week. Certainly this could be concerning for dysrhythmia. She does have a heart murmur which is new. So will need to rule out aortic stenosis as a cause. She had some dosing changes and Namenda which may be contributing to this. If her workup is negative then would consider stopping this medication but with this and consultation with Dr. Manuella Ghazi. We will observe her overnight on a monitor, frequent neuro checks and get an echocardiogram.   2. Heart murmur. This is new. We'll get echocardiogram to evaluate. 3. Dementia. Currently on Namenda has been placed back on the starting dose. We'll continue this for now however may end up stopping this medication because of side effects if nothing turns out on her workup.  Total time spent 35 minutes  Baxter Hire, MD 01/04/2017, 12:30 PM

## 2017-01-04 NOTE — ED Notes (Signed)
RN attempted to call report, Maddie, RN in a room and will call me back in a few minutes.

## 2017-01-04 NOTE — ED Provider Notes (Signed)
[facility] Emergency Department Provider Note  ____________________________________________   First MD Initiated Contact with Patient 01/04/17 [phone]     (approximate)  I have reviewed the triage vital signs and the nursing notes.   HISTORY  Chief Complaint Loss of Consciousness    HPI [name] is a 76 y.o. female who comes to the emergency department via EMS after sustaining her second syncopal episode in a week.History obtained largely from the patient's husband at bedside who is a retired Chief Strategy Officer as the patient does not remember what happened. According to her husband the patient was standing in church in the next thing he knew she collapsed onto the pew. It took her about 20 seconds to wake up and become normal again. She had no seizure-like activity. She had no preceding symptoms. No chest pain palpitations shortness of breath abdominal pain nausea or vomiting. She has no headache double vision or blurred vision. A similar event happened at home 1 week prior however the patient refused to come to the hospital. She has a remote history of meningioma resection 20 years ago and is status post XRT. She also had breast cancer treated by a lumpectomy and chemotherapy 1-1/2 years ago. She's never had cardiac disease. She's never had a deep vein thrombus or pulmonary embolism. Her symptoms are now completely resolved.   Past Medical History:  Diagnosis Date  . Arthritis   . 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 Breast cancer ([place]) 2016  . Breast cancer of upper-inner quadrant of left female breast ([place]) 12/15/14   Completed radiation end of December and finished chemotherapy 2 weeks ago, Left breast invasive mammary carcinoma, T1cN34mc (1.5 cm); Grade 3, [name], [name], [name], .  .Marland KitchenCataract    bilat   . 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 Hearing loss   . History of cancer chemotherapy   .  History of radiation therapy   . Imbalance   . Memory impairment    seen by Dr SManuella Ghazi possible post crainiotomy from radiation  . Numbness and tingling    right hand   . Shoulder pain, right   . Wears glasses     Patient Active Problem List   Diagnosis Date Noted  . Carcinoma of overlapping sites of left breast in female, estrogen receptor negative ([place] 01/23/2016  . S/P right THA, AA 09/04/2015  . History of artificial joint 09/04/2015  . 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 Malignant neoplasm of female breast ([place] 07/18/2015  . Neoplasm of meninges 07/18/2015  . Breast cancer metastasized to axillary lymph node ([place] 01/24/2015  . DDD (degenerative disc disease), lumbar 01/02/2015  . HLD (hyperlipidemia) 01/02/2015  . Benign neoplasm of meninges ([place] 01/02/2015  . Arthritis, degenerative 01/02/2015  . Psoriasis 01/02/2015  . Rotator cuff arthropathy 09/15/2012    Past Surgical History:  Procedure Laterality Date  . APPENDECTOMY  1950  . BRAIN SURGERY  1995   left frontal/temporal  . BREAST BIOPSY Left 1997  . BREAST BIOPSY Left 12/15/14   confirmed DCIS  . BREAST BIOPSY Left 01/08/2015   Procedure: BREAST BIOPSY WITH NEEDLE LOCALIZATION;  Surgeon: [name] MD;  Location: [facility] ORS;  Service: General;  Laterality: Left;  . BREAST LUMPECTOMY Left 01/08/2015   Procedure: LUMPECTOMY;  Surgeon: [name] MD;  Location: [facility] ORS;  Service: General;  Laterality:  Left;  . COLONOSCOPY  2010   Dr. [name]  . PORTACATH PLACEMENT Right 01/16/2015   Procedure: INSERTION PORT-A-CATH;  Surgeon: [name], MD;  Location: [facility] ORS;  Service: General;  Laterality: Right;  . SENTINEL NODE BIOPSY Left 01/16/2015   Procedure: SENTINEL NODE BIOPSY;  Surgeon: [name], MD;  Location: [facility] ORS;  Service: General;  Laterality: Left;  . TOTAL HIP ARTHROPLASTY Right 09/04/2015   Procedure: RIGHT TOTAL HIP ARTHROPLASTY ANTERIOR APPROACH;   Surgeon: [name], MD;  Location: [facility];  Service: Orthopedics;  Laterality: Right;    Prior to Admission medications   Medication Sig Start Date End Date Taking? Authorizing Provider  Ascorbic Acid (VITAMIN C) 100 MG tablet Take 100 mg by mouth every morning.    Yes [provider]  Calcium Carbonate-Vit D-Min (CALTRATE 600+D PLUS MINERALS) 600-800 MG-UNIT TABS Take 1 tablet by mouth every morning.   Yes [provider]  ETODOLAC PO Take 1 tablet by mouth daily.   Yes [provider]  loratadine (CLARITIN) 10 MG tablet Take 10 mg by mouth daily as needed.    Yes [provider]  memantine (NAMENDA) 5 MG tablet Take 5 mg by mouth daily.   Yes [provider]  polyvinyl alcohol (LIQUIFILM TEARS) 1.4 % ophthalmic solution Place 1 drop into both eyes as needed for dry eyes.    Yes [provider]  traMADol (ULTRAM) 50 MG tablet Take 50 mg by mouth as needed.  10/24/15  Yes [provider]  vitamin E 400 UNIT capsule Take 400 Units by mouth daily.    Yes [provider]  ferrous sulfate 325 (65 FE) MG tablet Take 1 tablet (325 mg total) by mouth 3 (three) times daily after meals. Patient not taking: Reported on 01/04/2017 09/05/15   Danae Orleans, PA-C  lidocaine-prilocaine (EMLA) cream Apply 1 application topically as needed. Patient not taking: Reported on 01/04/2017 01/23/15   [name], MD    Allergies Donepezil  Family History  Problem Relation Age of Onset  . Breast cancer Sister 104  . Addison's disease Mother   . Stroke Father     Social History Social History  Substance Use Topics  . Smoking status: Former Smoker    Packs/day: 0.25    Years: 5.00    Types: Cigarettes    Quit date: 07/28/1972  . Smokeless tobacco: Never Used  . Alcohol use 0.6 - 1.2 oz/week    1 - 2 Glasses of wine per week     Comment: 1 Glass Wine / Night    Review of Systems Constitutional: No fever/chills Eyes: No visual  changes. ENT: No sore throat. Cardiovascular: Denies chest pain. Respiratory: Denies shortness of breath. Gastrointestinal: No abdominal pain.  No nausea, no vomiting.  No diarrhea.  No constipation. Genitourinary: Negative for dysuria. Musculoskeletal: Negative for back pain. Skin: Negative for rash. Neurological: Negative for headaches, focal weakness or numbness.   ____________________________________________   PHYSICAL EXAM:  VITAL SIGNS: ED Triage Vitals  Enc Vitals Group     BP 01/04/17 0939 115/64     Pulse Rate 01/04/17 0939 70     Resp 01/04/17 0939 16     Temp 01/04/17 0939 97.6 F (36.4 C)     Temp Source 01/04/17 0939 Oral     SpO2 01/04/17 0939 96 %     Weight 01/04/17 0937 141 lb (64 kg)     Height 01/04/17 0937 5' 4"  (1.626 m)  Head Circumference --      Peak Flow --      Pain Score --      Pain Loc --      Pain Edu? --      Excl. in [place]? --     Constitutional: Alert and oriented x 4 well appearing nontoxic no diaphoresis speaks in full, clear sentences Eyes: PERRL EOMI. Head: Atraumatic. Nose: No congestion/rhinnorhea. Mouth/Throat: No trismusNo laceration to the tongue Neck: No stridor.   Cardiovascular: Normal rate, regular rhythm. 2/6 systolic murmur best heard at right sternal border Respiratory: Normal respiratory effort.  No retractions. Lungs CTAB and moving good air Gastrointestinal: Soft nontender Musculoskeletal: No lower extremity edema   Neurologic:  Normal speech and language. No gross focal neurologic deficits are appreciated. Skin:  Skin is warm, dry and intact. No rash noted. Psychiatric: Mood and affect are normal. Speech and behavior are normal.    ____________________________________________   DIFFERENTIAL  Cardiogenic syncope, vasovagal syncope, seizure, increased intracranial pressure ____________________________________________   LABS (all labs ordered are listed, but only abnormal results are displayed)  Labs  Reviewed  COMPREHENSIVE METABOLIC PANEL - Abnormal; Notable for the following:       Result Value   Glucose, Bld 116 (*)    All other components within normal limits  CBC  TROPONIN I  URINALYSIS, COMPLETE (UACMP) WITH MICROSCOPIC  CBG MONITORING, ED    No signs of acute ischemia __________________________________________  EKG  ED ECG REPORT I, Darel Hong, the attending physician, personally viewed and interpreted this ECG.  Date: 01/04/2017 Rate: 67 Rhythm: normal sinus rhythm QRS Axis: normal Intervals: normal ST/T Wave abnormalities: normal Conduction Disturbances: none Narrative Interpretation: unremarkable  ____________________________________________  RADIOLOGY  Head CT with no acute disease ____________________________________________   PROCEDURES  Procedure(s) performed: no  Procedures  Critical Care performed: no  Observation: no ____________________________________________   INITIAL IMPRESSION / ASSESSMENT AND PLAN / ED COURSE  Pertinent labs & imaging results that were available during my care of the patient were reviewed by me and considered in my medical decision making (see chart for details).  The patient arrives very well-appearing but after concerning syncopal episode. CT scan here today fortunately is negative for acute pathology. Her EKG is also negative for arrhythmia or other concerning signs of syncope. As this is her second event in the past week however in that she has a murmur concerning for aortic stenosis she will require inpatient admission for further workup of her syncope and likely echocardiogram etc.      ____________________________________________   FINAL CLINICAL IMPRESSION(S) / ED DIAGNOSES  Final diagnoses:  Syncope, unspecified syncope type      NEW MEDICATIONS STARTED DURING THIS VISIT:  New Prescriptions   No medications on file     Note:  This document was prepared using Dragon voice recognition  software and may include unintentional dictation errors.     Darel Hong, MD 01/04/17 1050

## 2017-01-04 NOTE — Care Management Obs Status (Signed)
[facility] NOTIFICATION   Patient Details  Name: ISEL SKUFCA MRN: [id number] Date of Birth: August 19, 1940   Medicare Observation Status Notification Given:  Yes (syncope)    Ival Bible, RN 01/04/2017, 12:45 PM

## 2017-01-05 ENCOUNTER — Observation Stay
Admit: 2017-01-05 | Discharge: 2017-01-05 | Disposition: A | Discharge status: Home / Self Care | Payer: Medicare Other | Attending: Internal Medicine | Admitting: Internal Medicine

## 2017-01-05 DIAGNOSIS — R55 Syncope and collapse: Secondary | ICD-10-CM | POA: Diagnosis not present

## 2017-01-05 LAB — ECHOCARDIOGRAM COMPLETE
Height: 65 in
WEIGHTICAEL: 2584 [oz_av]

## 2017-01-05 LAB — TROPONIN I: Troponin I: <0.03 ng/mL (ref <0.03)

## 2017-01-05 MED ORDER — VITAMIN C 500 MG PO TABS
125.0000 mg | ORAL_TABLET | Freq: Every day | ORAL | Status: DC
  Administered 2017-01-05: 11:00:00 250 mg via ORAL
  Filled 2017-01-05 (×2): qty 1

## 2017-01-05 MED ORDER — TRAMADOL HCL 50 MG PO TABS: 50.0000 mg | ORAL_TABLET | Freq: Four times a day (QID) | ORAL | Status: DC | PRN

## 2017-01-05 NOTE — Progress Notes (Signed)
[name]    MR#:  [id number]  DATE OF BIRTH:  [birth year]  SUBJECTIVE:   Doing well this point. No dizziness or shortness of breath.  REVIEW OF SYSTEMS:    Review of Systems  Constitutional: Negative for fever, chills weight loss HENT: Negative for ear pain, nosebleeds, congestion, facial swelling, rhinorrhea, neck pain, neck stiffness and ear discharge.   Respiratory: Negative for cough, shortness of breath, wheezing  Cardiovascular: Negative for chest pain, palpitations and leg swelling.  Gastrointestinal: Negative for heartburn, abdominal pain, vomiting, diarrhea or consitpation Genitourinary: Negative for dysuria, urgency, frequency, hematuria Musculoskeletal: Negative for back pain or joint pain Neurological: Negative for dizziness, seizures, syncope, focal weakness,  numbness and headaches.  Hematological: Does not bruise/bleed easily.  Psychiatric/Behavioral: Negative for hallucinations, confusion, dysphoric mood Has dementia   Tolerating Diet: yes      DRUG ALLERGIES:   Allergies  Allergen Reactions  . Donepezil     Other reaction(s): Other (See Comments) Nightmares    VITALS:  Blood pressure 113/60, pulse 81, temperature 97.6 F (36.4 C), temperature source Oral, resp. rate 18, height 5\' 5"  (1.651 m), weight 73.3 kg (161 lb 8 oz), SpO2 99 %.  PHYSICAL EXAMINATION:  Constitutional: Appears well-developed and well-nourished. No distress. HENT: Normocephalic. Marland Kitchen Oropharynx is clear and moist.  Eyes: Conjunctivae and EOM are normal. PERRLA, no scleral icterus.  Neck: Normal ROM. Neck supple. No JVD. No tracheal deviation. CVS: RRR, S1/S2 +, no murmurs, no gallops, no carotid bruit.  Pulmonary: Effort and breath sounds normal, no stridor, rhonchi, wheezes, rales.  Abdominal: Soft. BS +,  no distension, tenderness, rebound or guarding.  Musculoskeletal: Normal range of motion. No edema and  no tenderness.  Neuro: Alert. CN 2-12 grossly intact. No focal deficits. Skin: Skin is warm and dry. No rash noted. Psychiatric: Normal mood and affect.      LABORATORY PANEL:   CBC  Recent Labs Lab 01/04/17 0936  WBC 5.7  HGB 13.9  HCT 41.4  PLT 310   ------------------------------------------------------------------------------------------------------------------  Chemistries   Recent Labs Lab 01/04/17 0936  NA 140  K 3.7  CL 102  CO2 31  GLUCOSE 116*  BUN 19  CREATININE 0.87  CALCIUM 9.2  AST 23  ALT 18  ALKPHOS 55  BILITOT 0.6   ------------------------------------------------------------------------------------------------------------------  Cardiac Enzymes  Recent Labs Lab 01/04/17 1455 01/04/17 2058 01/05/17 0035  TROPONINI <0.03 <0.03 <0.03   ------------------------------------------------------------------------------------------------------------------  RADIOLOGY:  Ct Head Wo Contrast  Result Date: 01/04/2017 CLINICAL DATA:  76 year old female with syncopal episode at church. Similar syncopal episode with fall 1 week ago. Left frontotemporal craniotomy in the 1990s for meningioma. EXAM: CT HEAD WITHOUT CONTRAST TECHNIQUE: Contiguous axial images were obtained from the base of the skull through the vertex without intravenous contrast. COMPARISON:  Brain MRI 09/08/2014 and earlier. FINDINGS: Brain: Stable cerebral volume since 2016. Chronic dystrophic calcifications in the brainstem were evident on prior T2* MRI imaging. Patchy bilateral chronic cerebral white matter hypodensity. Small area of chronic encephalomalacia at the left anterior temporal lobe. No midline shift, ventriculomegaly, mass effect, evidence of mass lesion, intracranial hemorrhage or evidence of cortically based acute infarction. Vascular: Calcified atherosclerosis at the skull base. No suspicious intracranial vascular hyperdensity. Skull: Remote left frontotemporal craniotomy  changes. No acute osseous abnormality identified. Sinuses/Orbits: Visualized paranasal sinuses and mastoids are stable and well pneumatized. Other: No acute orbit or scalp soft tissue findings. IMPRESSION: 1. No acute  intracranial abnormality. No acute traumatic injury identified. 2. Noncontrast CT appearance of the brain appears stable to the 2016 MRI; chronic postoperative encephalomalacia at the left anterior temporal lobe, chronic dystrophic calcifications in the brainstem, and chronic bilateral white matter changes most likely due to small vessel disease. Electronically Signed   By: Genevie Ann M.D.   On: 01/04/2017 10:12     ASSESSMENT AND PLAN:   76 year old female with a history of craniotomy secondary to meningioma who presented with syncope.  1 syncope: Telemetry and troponins are unremarkable. Follow up on echocardiogram. Patient is not orthostatic. Patient would benefit from outpatient Holter monitor. Continue decreased dose of Namenda.  2. Dementia: Patient's Namenda dose has been decreased as this may be an etiology for syncope.  3. Anemia of chronic disease: Continue ferrous sulfate      Management plans discussed with the patient and husband and they are in agreement.  CODE STATUS: full  TOTAL TIME TAKING CARE OF THIS PATIENT: 30 minutes.     POSSIBLE D/C today, DEPENDING ON CLINICAL CONDITION.   Ceejay Kegley M.D on 01/05/2017 at 8:48 AM  Between 7am to 6pm - Pager - [phone] After 6pm go to [email or web address] - password [email or web address] [facility]  Office  [phone]  CC: Primary care physician; [name], MD  Note: This dictation was prepared with Dragon dictation along with smaller phrase technology. Any transcriptional errors that result from this process are unintentional.

## 2017-01-05 NOTE — Plan of Care (Signed)
Pt is d/ced home.  She has had no further syncopal episodes.  Has walked around the nurse's station on telemetry - she has remained NSR.  ECHO has been done.  Patient will f/u with cardiology tomorrow and will be on holter monitor.  Only recent change was increase in Namenda; dosage was decreased to former dose.  D/c instructions reviewed with patient and husband.  IV removed; tele d/ced.  Patient will go home with husband.

## 2017-01-05 NOTE — Discharge Summary (Signed)
[place] [facility] [place] NAME: [name]    MR#:  [id number]  DATE OF BIRTH:  April 15, 1941  DATE OF ADMISSION:  01/04/2017 ADMITTING PHYSICIAN: Baxter Hire, MD  DATE OF DISCHARGE: 01/05/2017  PRIMARY CARE PHYSICIAN: [name], MD    ADMISSION DIAGNOSIS:  Syncope, unspecified syncope type [R55]  DISCHARGE DIAGNOSIS:  Active Problems:   Syncope   SECONDARY DIAGNOSIS:   Past Medical History:  Diagnosis Date  . Arthritis   . 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 Breast cancer ([place]) 2016  . Breast cancer of upper-inner quadrant of left female breast ([facility]) 12/15/14   Completed radiation end of December and finished chemotherapy 2 weeks ago, Left breast invasive mammary carcinoma, T1cN35mc (1.5 cm); Grade 3, [name], [name], [name], .  .Marland KitchenCataract    bilat   . 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 Hearing loss   . History of cancer chemotherapy   . History of radiation therapy   . Imbalance   . Memory impairment    seen by Dr SManuella Ghazi possible post crainiotomy from radiation  . Numbness and tingling    right hand   . Shoulder pain, right   . Wears glasses     HOSPITAL COURSE:  76year old female with a history of craniotomy secondary to meningioma who presented with syncope.  1 syncope: Telemetry and troponins are unremarkable. Echocardiogram showed normal ejection fraction without valvular abnormalities Patient is not orthostatic. Patient would benefit from outpatient Holter monitor which will be set up her primary cardiologist   2. Dementia: Continue Namenda 3. Anemia of chronic disease: Continue ferrous sulfate   DISCHARGE CONDITIONS AND DIET:  Stable Regular deit  CONSULTS OBTAINED:    DRUG ALLERGIES:   Allergies  Allergen Reactions  . Donepezil     Other reaction(s): Other (See Comments) Nightmares    DISCHARGE MEDICATIONS:   Current Discharge  Medication List    CONTINUE these medications which have NOT CHANGED   Details  Ascorbic Acid (VITAMIN C) 100 MG tablet Take 100 mg by mouth every morning.     Calcium Carbonate-Vit D-Min (CALTRATE 600+D PLUS MINERALS) 600-800 MG-UNIT TABS Take 1 tablet by mouth every morning.    ETODOLAC PO Take 1 tablet by mouth daily.    loratadine (CLARITIN) 10 MG tablet Take 10 mg by mouth daily as needed.     memantine (NAMENDA) 5 MG tablet Take 5 mg by mouth daily.    polyvinyl alcohol (LIQUIFILM TEARS) 1.4 % ophthalmic solution Place 1 drop into both eyes as needed for dry eyes.     traMADol (ULTRAM) 50 MG tablet Take 50 mg by mouth as needed.    Associated Diagnoses: Breast cancer metastasized to axillary lymph node, left (HCC)    vitamin E 400 UNIT capsule Take 400 Units by mouth daily.    Associated Diagnoses: Carcinoma of overlapping sites of left breast in female, estrogen receptor negative (HCC)    ferrous sulfate 325 (65 FE) MG tablet Take 1 tablet (325 mg total) by mouth 3 (three) times daily after meals. Refills: 3    lidocaine-prilocaine (EMLA) cream Apply 1 application topically as needed. Qty: 30 g, Refills: 3   Associated Diagnoses: Adenocarcinoma, breast, left (HCC)          Today   CHIEF COMPLAINT:  No acute issues overnight doing well  VITAL SIGNS:  Blood pressure 113/60, pulse 81, temperature 97.6 F (36.4 C), temperature source Oral, resp. rate 18, height 5' 5"  (1.651 m), weight 73.3 kg (161 lb 8 oz), SpO2 99 %.   REVIEW OF SYSTEMS:  Review of Systems  Constitutional: Negative.  Negative for chills, fever and malaise/fatigue.  HENT: Negative.  Negative for ear discharge, ear pain, hearing loss, nosebleeds and sore throat.   Eyes: Negative.  Negative for blurred vision and pain.  Respiratory: Negative.  Negative for cough, hemoptysis, shortness of breath and wheezing.   Cardiovascular: Negative.  Negative for chest pain, palpitations and leg swelling.   Gastrointestinal: Negative.  Negative for abdominal pain, blood in stool, diarrhea, nausea and vomiting.  Genitourinary: Negative.  Negative for dysuria.  Musculoskeletal: Negative.  Negative for back pain.  Skin: Negative.   Neurological: Negative for dizziness, tremors, speech change, focal weakness, seizures and headaches.  Endo/Heme/Allergies: Negative.  Does not bruise/bleed easily.  Psychiatric/Behavioral: Positive for memory loss. Negative for depression, hallucinations and suicidal ideas.     PHYSICAL EXAMINATION:  GENERAL:  76 y.o.-year-old patient lying in the bed with no acute distress.  NECK:  Supple, no jugular venous distention. No thyroid enlargement, no tenderness.  LUNGS: Normal breath sounds bilaterally, no wheezing, rales,rhonchi  No use of accessory muscles of respiration.  CARDIOVASCULAR: S1, S2 normal. No murmurs, rubs, or gallops.  ABDOMEN: Soft, non-tender, non-distended. Bowel sounds present. No organomegaly or mass.  EXTREMITIES: No pedal edema, cyanosis, or clubbing.  PSYCHIATRIC: The patient is alert and oriented x 3.  SKIN: No obvious rash, lesion, or ulcer.   DATA REVIEW:   CBC  Recent Labs Lab 01/04/17 0936  WBC 5.7  HGB 13.9  HCT 41.4  PLT 310    Chemistries   Recent Labs Lab 01/04/17 0936  NA 140  K 3.7  CL 102  CO2 31  GLUCOSE 116*  BUN 19  CREATININE 0.87  CALCIUM 9.2  AST 23  ALT 18  ALKPHOS 55  BILITOT 0.6    Cardiac Enzymes  Recent Labs Lab 01/04/17 1455 01/04/17 2058 01/05/17 0035  TROPONINI <0.03 <0.03 <0.03    Microbiology Results  @[email or web address] @  RADIOLOGY:  Ct Head Wo Contrast  Result Date: 01/04/2017 CLINICAL DATA:  76 year old female with syncopal episode at church. Similar syncopal episode with fall 1 week ago. Left frontotemporal craniotomy in the 1990s for meningioma. EXAM: CT HEAD WITHOUT CONTRAST TECHNIQUE: Contiguous axial images were obtained from the base of the skull through the vertex without  intravenous contrast. COMPARISON:  Brain MRI 09/08/2014 and earlier. FINDINGS: Brain: Stable cerebral volume since 2016. Chronic dystrophic calcifications in the brainstem were evident on prior T2* MRI imaging. Patchy bilateral chronic cerebral white matter hypodensity. Small area of chronic encephalomalacia at the left anterior temporal lobe. No midline shift, ventriculomegaly, mass effect, evidence of mass lesion, intracranial hemorrhage or evidence of cortically based acute infarction. Vascular: Calcified atherosclerosis at the skull base. No suspicious intracranial vascular hyperdensity. Skull: Remote left frontotemporal craniotomy changes. No acute osseous abnormality identified. Sinuses/Orbits: Visualized paranasal sinuses and mastoids are stable and well pneumatized. Other: No acute orbit or scalp soft tissue findings. IMPRESSION: 1. No acute intracranial abnormality. No acute traumatic injury identified. 2. Noncontrast CT appearance of the brain appears stable to the 2016 MRI; chronic postoperative encephalomalacia at the left anterior temporal lobe, chronic dystrophic calcifications in the brainstem, and chronic bilateral white matter changes most likely due to small vessel disease. Electronically Signed   By: Genevie Ann  M.D.   On: 01/04/2017 10:12      Current Discharge Medication List    CONTINUE these medications which have NOT CHANGED   Details  Ascorbic Acid (VITAMIN C) 100 MG tablet Take 100 mg by mouth every morning.     Calcium Carbonate-Vit D-Min (CALTRATE 600+D PLUS MINERALS) 600-800 MG-UNIT TABS Take 1 tablet by mouth every morning.    ETODOLAC PO Take 1 tablet by mouth daily.    loratadine (CLARITIN) 10 MG tablet Take 10 mg by mouth daily as needed.     memantine (NAMENDA) 5 MG tablet Take 5 mg by mouth daily.    polyvinyl alcohol (LIQUIFILM TEARS) 1.4 % ophthalmic solution Place 1 drop into both eyes as needed for dry eyes.     traMADol (ULTRAM) 50 MG tablet Take 50 mg by  mouth as needed.    Associated Diagnoses: Breast cancer metastasized to axillary lymph node, left (HCC)    vitamin E 400 UNIT capsule Take 400 Units by mouth daily.    Associated Diagnoses: Carcinoma of overlapping sites of left breast in female, estrogen receptor negative (HCC)    ferrous sulfate 325 (65 FE) MG tablet Take 1 tablet (325 mg total) by mouth 3 (three) times daily after meals. Refills: 3    lidocaine-prilocaine (EMLA) cream Apply 1 application topically as needed. Qty: 30 g, Refills: 3   Associated Diagnoses: Adenocarcinoma, breast, left ([place])           Management plans discussed with the patient and she is in agreement. Stable for discharge home  Patient should follow up with pcp and cardiology  CODE STATUS:     Code Status Orders        Start     Ordered   01/04/17 1306  Full code  Continuous     01/04/17 1306    Code Status History    Date Active Date Inactive Code Status Order ID Comments User Context   This patient has a current code status but no historical code status.    Advance Directive Documentation     Most Recent Value  Type of Advance Directive  Healthcare Power of Attorney, Living will  Pre-existing out of facility DNR order (yellow form or pink MOST form)  -  "MOST" Form in Place?  -      TOTAL TIME TAKING CARE OF THIS PATIENT: 37 minutes.    Note: This dictation was prepared with Dragon dictation along with smaller phrase technology. Any transcriptional errors that result from this process are unintentional.  Takahiro Godinho M.D on 01/05/2017 at 8:51 AM  Between 7am to 6pm - Pager - [phone] After 6pm go to [email or web address] - password [email or web address] [facility]  Office  [phone]  CC: Primary care physician; [name], MD

## 2017-01-05 NOTE — Progress Notes (Signed)
While rounding, [place] made initial visit to room 124. Pt was in good spirits. Husband was bedside. Pt is awaiting an echo cardiogram to find why she is having fainting spells. Pt is hopeful she can have the test and be discharged today. Pt did not indicate a need for further spiritual support and was appreciative of the visit. [place] will follow up on next round.    01/05/17 1300  Clinical Encounter Type  Visited With Patient;Patient and family together  Visit Type Initial;Spiritual support  Consult/Referral To Chaplain

## 2017-01-05 NOTE — Progress Notes (Signed)
*  PRELIMINARY RESULTS* Echocardiogram 2D Echocardiogram has been performed.  [name] 01/05/2017, 1:49 PM

## 2017-01-06 DIAGNOSIS — R55 Syncope and collapse: Secondary | ICD-10-CM | POA: Insufficient documentation

## 2017-01-29 ENCOUNTER — Ambulatory Visit: Payer: Medicare Other | Admitting: Physician Assistant

## 2017-04-29 ENCOUNTER — Ambulatory Visit: Payer: Medicare Other | Admitting: General Surgery

## 2017-04-29 ENCOUNTER — Ambulatory Visit (INDEPENDENT_AMBULATORY_CARE_PROVIDER_SITE_OTHER): Payer: Medicare Other | Admitting: General Surgery

## 2017-04-29 ENCOUNTER — Encounter: Payer: Self-pay | Admitting: General Surgery

## 2017-04-29 VITALS — BP 138/74 | HR 62 | Resp 14 | Ht 61.0 in | Wt 167.0 lb

## 2017-04-29 DIAGNOSIS — C773 Secondary and unspecified malignant neoplasm of axilla and upper limb lymph nodes: Secondary | ICD-10-CM

## 2017-04-29 DIAGNOSIS — C50912 Malignant neoplasm of unspecified site of left female breast: Secondary | ICD-10-CM

## 2017-04-29 NOTE — Patient Instructions (Signed)
Patient to return in ine month.

## 2017-04-29 NOTE — Progress Notes (Signed)
Patient ID: [name], female   DOB: [birth year], 76 y.o.   MRN: [id number]  Chief Complaint  Patient presents with  . Follow-up    HPI [name] is a 76 y.o. female here today for bilateral breast pain. Last mammogram 12/25/2016. Patient states her breast pain has been going on about two years now. This is primarily located well lateral on the chest wall.  At the time of the patient's 01/31/2017 scheduled visit there had been a report of a fall with emphasis of the patient having struck the left arm. In light of today's physical finding the actual fall was a syncopal spell where the patient was witnessed to fall flat on her chest. Reported some mild ecchymosis on the upper chest at that time. 3 weeks later she had a similar syncopal spell and fell against a church pew. Unclear of direct trauma to the chest at that time. She was evaluated in the emergency department with the second episode and subsequently underwent repeat evaluation with Dr. Brigitte Pulse from neurology. Namenda was discontinued at that time. EEG is pending looking for occult seizures.   At the present, the patient is not having much discomfort in the lower half of the breast or in the chest wall.  Husband, Legrand Como is present at visit.  HPI  Past Medical History:  Diagnosis Date  . Arthritis   . Brain tumor ([facility]) 1995   meningeoma  . Breast cancer ([facility]) 2016  . Breast cancer of upper-inner quadrant of left female breast ([facility]) 12/15/14   Completed radiation end of December and finished chemotherapy 2 weeks ago, Left breast invasive mammary carcinoma, [name]; Grade 3, [name], [name], [name], .  .Marland KitchenCataract    bilat   . 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 Hearing loss   . History of cancer chemotherapy   . History of radiation therapy   . Imbalance   . Memory impairment    seen by Dr SManuella Ghazi possible post crainiotomy from radiation  . Numbness and tingling     right hand   . Shoulder pain, right   . Wears glasses     Past Surgical History:  Procedure Laterality Date  . APPENDECTOMY  1950  . BRAIN SURGERY  1995   left frontal/temporal  . BREAST BIOPSY Left 1997  . BREAST BIOPSY Left 12/15/14   confirmed DCIS  . BREAST BIOPSY Left 01/08/2015   Procedure: BREAST BIOPSY WITH NEEDLE LOCALIZATION;  Surgeon: [name] MD;  Location: [facility] ORS;  Service: General;  Laterality: Left;  . BREAST LUMPECTOMY Left 01/08/2015   Procedure: LUMPECTOMY;  Surgeon: [name] MD;  Location: [facility] ORS;  Service: General;  Laterality: Left;  . COLONOSCOPY  2010   Dr. ETiffany Kocher . PORTACATH PLACEMENT Right 01/16/2015   Procedure: INSERTION PORT-A-CATH;  Surgeon: [name] MD;  Location: [facility] ORS;  Service: General;  Laterality: Right;  . SENTINEL NODE BIOPSY Left 01/16/2015   Procedure: SENTINEL NODE BIOPSY;  Surgeon: [name] MD;  Location: [facility] ORS;  Service: General;  Laterality: Left;  . TOTAL HIP ARTHROPLASTY Right 09/04/2015   Procedure: RIGHT TOTAL HIP ARTHROPLASTY ANTERIOR APPROACH;  Surgeon: MParalee Cancel MD;  Location: [facility];  Service: Orthopedics;  Laterality: Right;    Family History  Problem Relation Age of Onset  . Breast cancer Sister 463 . Addison's disease Mother   .  Stroke Father     Social History Social History  Substance Use Topics  . Smoking status: Former Smoker    Packs/day: 0.25    Years: 5.00    Types: Cigarettes    Quit date: 07/28/1972  . Smokeless tobacco: Never Used  . Alcohol use 0.6 - 1.2 oz/week    1 - 2 Glasses of wine per week     Comment: 1 Glass Wine / Night    Allergies  Allergen Reactions  . Donepezil     Other reaction(s): Other (See Comments) Nightmares    Current Outpatient Prescriptions  Medication Sig Dispense Refill  . Ascorbic Acid (VITAMIN C) 100 MG tablet Take 100 mg by mouth every morning.     . Calcium Carbonate-Vit D-Min (CALTRATE 600+D PLUS MINERALS) 600-800  MG-UNIT TABS Take 1 tablet by mouth every morning.    . ETODOLAC PO Take 1 tablet by mouth daily.    . ferrous sulfate 325 (65 FE) MG tablet Take 1 tablet (325 mg total) by mouth 3 (three) times daily after meals.  3  . lidocaine-prilocaine (EMLA) cream Apply 1 application topically as needed. 30 g 3  . loratadine (CLARITIN) 10 MG tablet Take 10 mg by mouth daily as needed.     . memantine (NAMENDA) 5 MG tablet Take 5 mg by mouth daily.    . polyvinyl alcohol (LIQUIFILM TEARS) 1.4 % ophthalmic solution Place 1 drop into both eyes as needed for dry eyes.     . traMADol (ULTRAM) 50 MG tablet Take 50 mg by mouth as needed.     . vitamin E 400 UNIT capsule Take 400 Units by mouth daily.      No current facility-administered medications for this visit.     Review of Systems Review of Systems  Constitutional: Negative.   Respiratory: Negative.   Cardiovascular: Negative.     Blood pressure 138/74, pulse 62, resp. rate 14, height 5\' 1"  (1.549 m), weight 167 lb (75.8 kg).  Physical Exam Physical Exam  Constitutional: She is oriented to person, place, and time. She appears well-developed and well-nourished.  Eyes: Conjunctivae are normal. No scleral icterus.  Neck: Neck supple.  Cardiovascular: Normal rate, regular rhythm and normal heart sounds.   Pulmonary/Chest: Effort normal and breath sounds normal. Right breast exhibits no inverted nipple, no mass, no nipple discharge, no skin change and no tenderness. Left breast exhibits no inverted nipple, no mass, no nipple discharge, no skin change and no tenderness.    Lymphadenopathy:    She has no cervical adenopathy.    She has no axillary adenopathy.  Neurological: She is alert and oriented to person, place, and time.  Skin: Skin is warm and dry.    Data Reviewed 12/25/2016 mammogram and left breast ultrasound was reviewed once again. BI-RADS-2.  Assessment    Dramatic change in the left breast exam since June of this year.       Plan     the chest wall changes may be related to the 2 traumatic episodes described above, but the faint erythema without tenderness is of concern. I spoke with Dr. July to confirm the area of "boost" was confined to the wide excision site. It's difficult to explain the clinically apparent edema of the skin based on the radiation fields or surgical incisions.  The patient had originally presented to discuss whether she should have or could have reduction of her breast volume for comfort. I think this needs to be put on hold.  I'm going to arrange for a chest CT to determine if there is any underlying trauma to the chest wall.  We'll reassess the chest wall changes in a month if the CT is negative and if they persist we'll likely proceed to MRI and/or biopsy to look for an occult malignancy.      HPI, Physical Exam, Assessment and Plan have been scribed under the direction and in the presence of Hervey Ard, MD.  Gaspar Cola, CMA  I have completed the exam and reviewed the above documentation for accuracy and completeness.  I agree with the above.  Haematologist has been used and any errors in dictation or transcription are unintentional.  Hervey Ard, M.D., F.A.C.S.   [name] 04/30/2017, 8:21 AM

## 2017-04-30 ENCOUNTER — Telehealth: Payer: Self-pay

## 2017-04-30 ENCOUNTER — Other Ambulatory Visit: Payer: Self-pay

## 2017-04-30 DIAGNOSIS — S299XXA Unspecified injury of thorax, initial encounter: Secondary | ICD-10-CM

## 2017-04-30 DIAGNOSIS — Z01812 Encounter for preprocedural laboratory examination: Secondary | ICD-10-CM

## 2017-04-30 DIAGNOSIS — R0789 Other chest pain: Secondary | ICD-10-CM

## 2017-04-30 NOTE — Telephone Encounter (Signed)
Call to schedule a CT of the chest with contrast. The patient is scheduled for this at [facility] on 05/07/17 at 9:00 am. She will arrive by 8:45 am and have nothing to eat and only clear liquids for 4 hours prior. She is aware of date, time, and instructions.

## 2017-04-30 NOTE — Telephone Encounter (Signed)
-----   Message from [name], MD sent at 04/30/2017  8:33 AM EDT ----- Please arrange for a CT of the chest with contrast:  DX: Chest wall trauma s/p fall  Contact husband to coordinate scheduling. Thanks.

## 2017-05-07 ENCOUNTER — Ambulatory Visit
Admission: RE | Admit: 2017-05-07 | Discharge: 2017-05-07 | Disposition: A | Discharge status: Home / Self Care | Payer: Medicare Other | Source: Ambulatory Visit | Attending: General Surgery | Admitting: General Surgery

## 2017-05-07 DIAGNOSIS — R590 Localized enlarged lymph nodes: Secondary | ICD-10-CM | POA: Insufficient documentation

## 2017-05-07 DIAGNOSIS — R9389 Abnormal findings on diagnostic imaging of other specified body structures: Secondary | ICD-10-CM | POA: Insufficient documentation

## 2017-05-07 DIAGNOSIS — R0789 Other chest pain: Secondary | ICD-10-CM | POA: Diagnosis not present

## 2017-05-07 DIAGNOSIS — M799 Soft tissue disorder, unspecified: Secondary | ICD-10-CM | POA: Insufficient documentation

## 2017-05-07 DIAGNOSIS — S299XXA Unspecified injury of thorax, initial encounter: Secondary | ICD-10-CM | POA: Insufficient documentation

## 2017-05-07 DIAGNOSIS — X58XXXA Exposure to other specified factors, initial encounter: Secondary | ICD-10-CM | POA: Diagnosis not present

## 2017-05-07 LAB — POCT I-STAT CREATININE: Creatinine, Ser: 0.8 mg/dL (ref 0.44–1.00)

## 2017-05-07 MED ORDER — IOPAMIDOL (ISOVUE-300) INJECTION 61%
75.0000 mL | Freq: Once | INTRAVENOUS | Status: AC | PRN
  Administered 2017-05-07: 75 mL via INTRAVENOUS

## 2017-05-08 ENCOUNTER — Telehealth: Payer: Self-pay | Admitting: Internal Medicine

## 2017-05-08 ENCOUNTER — Telehealth: Payer: Self-pay

## 2017-05-08 ENCOUNTER — Other Ambulatory Visit: Payer: Self-pay

## 2017-05-08 DIAGNOSIS — Z171 Estrogen receptor negative status [ER-]: Principal | ICD-10-CM

## 2017-05-08 DIAGNOSIS — C50812 Malignant neoplasm of overlapping sites of left female breast: Secondary | ICD-10-CM

## 2017-05-08 NOTE — Telephone Encounter (Signed)
msg sent to sch. Team to arrange for apt.

## 2017-05-08 NOTE — Telephone Encounter (Signed)
Spoke to [name]. Reviewed the CT scan.  Awaiting Bx/PEt scan.  Recommend follow up with me on 10/18 at 1:30. No labs.

## 2017-05-08 NOTE — Telephone Encounter (Signed)
Spoke with patient's spouse and patient will come into the office on 05/11/17 at 8:15 am for a discussion of CT results an possible biopsy. She is also scheduled for a PET scan at [facility] on 05/13/17 at 7:30 am. She will arrive by 7:15 am and have nothing to eat or drink after midnight. The patient is aware of date, time, and instructions.

## 2017-05-11 ENCOUNTER — Encounter: Payer: Self-pay | Admitting: General Surgery

## 2017-05-11 ENCOUNTER — Inpatient Hospital Stay: Payer: Self-pay

## 2017-05-11 ENCOUNTER — Ambulatory Visit (INDEPENDENT_AMBULATORY_CARE_PROVIDER_SITE_OTHER): Payer: Medicare Other | Admitting: General Surgery

## 2017-05-11 VITALS — BP 132/74 | HR 76 | Resp 14 | Ht 61.0 in | Wt 167.0 lb

## 2017-05-11 DIAGNOSIS — C773 Secondary and unspecified malignant neoplasm of axilla and upper limb lymph nodes: Secondary | ICD-10-CM | POA: Diagnosis not present

## 2017-05-11 DIAGNOSIS — C50912 Malignant neoplasm of unspecified site of left female breast: Secondary | ICD-10-CM

## 2017-05-11 NOTE — Progress Notes (Signed)
Patient ID: [name], female   DOB: Feb 11, 1941, 76 y.o.   MRN: [id number]  Chief Complaint  Patient presents with  . Follow-up    HPI [name] is a 76 y.o. female here for discussion after her CT done on 05/07/17. The patient had been noted with a significant change in her breast exam prompting CT imaging. This suggested axillary metastatic disease not evident on ultrasound completed by the radiology department in May of this year. Also evidence of parasternal involvement below the level of the manubrium. Hal Neer are for biopsy to confirm diagnosis of recurrent disease. PET scan scheduled for 05/13/2017.  The patient also reports recurrent right hip pain, status post total joint replacement. This is new over the last several months.  HPI  Past Medical History:  Diagnosis Date  . Arthritis   . 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 Breast cancer ([place]) 2016  . 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  .Marland KitchenCataract    bilat   . 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 Hearing loss   . History of cancer chemotherapy   . History of radiation therapy   . Imbalance   . Memory impairment    seen by Dr SManuella Ghazi possible post crainiotomy from radiation  . Numbness and tingling    right hand   . Shoulder pain, right   . Wears glasses     Past Surgical History:  Procedure Laterality Date  . APPENDECTOMY  1950  . BRAIN SURGERY  1995   left frontal/temporal  . BREAST BIOPSY Left 1997  . BREAST BIOPSY Left 12/15/14   confirmed DCIS  . BREAST BIOPSY Left 01/08/2015   Procedure: BREAST BIOPSY WITH NEEDLE LOCALIZATION;  Surgeon: [name] MD;  Location: [facility] ORS;  Service: General;  Laterality: Left;  . BREAST LUMPECTOMY Left  01/08/2015   Procedure: LUMPECTOMY;  Surgeon: [name] MD;  Location: [facility] ORS;  Service: General;  Laterality: Left;  . COLONOSCOPY  2010   Dr. ETiffany Kocher . PORTACATH PLACEMENT Right 01/16/2015   Procedure: INSERTION PORT-A-CATH;  Surgeon: [name] MD;  Location: [facility];  Service: General;  Laterality: Right;  . SENTINEL NODE BIOPSY Left 01/16/2015   Procedure: SENTINEL NODE BIOPSY;  Surgeon: [name] MD;  Location: [facility];  Service: General;  Laterality: Left;  . TOTAL HIP ARTHROPLASTY Right 09/04/2015   Procedure: RIGHT TOTAL HIP ARTHROPLASTY ANTERIOR APPROACH;  Surgeon: MParalee Cancel MD;  Location: [facility];  Service: Orthopedics;  Laterality: Right;    Family History  Problem Relation Age of Onset  . Breast cancer Sister 481 . Addison's disease Mother   . Stroke Father     Social History Social History  Substance Use Topics  . Smoking status: Former Smoker    Packs/day: 0.25    Years: 5.00    Types: Cigarettes    Quit date: 07/28/1972  . Smokeless tobacco: Never Used  . Alcohol use 0.6 - 1.2 oz/week    1 - 2 Glasses of wine per week     Comment: 1 Glass Wine / Night    Allergies  Allergen Reactions  . Donepezil     Other reaction(s):  Other (See Comments) Nightmares    Current Outpatient Prescriptions  Medication Sig Dispense Refill  . Ascorbic Acid (VITAMIN C) 100 MG tablet Take 100 mg by mouth every morning.     . Calcium Carbonate-Vit D-Min (CALTRATE 600+D PLUS MINERALS) 600-800 MG-UNIT TABS Take 1 tablet by mouth every morning.    . ETODOLAC PO Take 1 tablet by mouth daily.    . ferrous sulfate 325 (65 FE) MG tablet Take 1 tablet (325 mg total) by mouth 3 (three) times daily after meals.  3  . lidocaine-prilocaine (EMLA) cream Apply 1 application topically as needed. 30 g 3  . loratadine (CLARITIN) 10 MG tablet Take 10 mg by mouth daily as needed.     . memantine (NAMENDA) 5 MG tablet Take 5 mg by mouth daily.    . polyvinyl alcohol  (LIQUIFILM TEARS) 1.4 % ophthalmic solution Place 1 drop into both eyes as needed for dry eyes.     . traMADol (ULTRAM) 50 MG tablet Take 50 mg by mouth as needed.     . vitamin E 400 UNIT capsule Take 400 Units by mouth daily.      No current facility-administered medications for this visit.     Review of Systems Review of Systems  Constitutional: Negative.   Respiratory: Negative.   Cardiovascular: Negative.     Blood pressure 132/74, pulse 76, resp. rate 14, height 5' 1"  (1.549 m), weight 167 lb (75.8 kg).  Physical Exam Physical Exam  Constitutional: She is oriented to person, place, and time. She appears well-developed and well-nourished.  Eyes: Conjunctivae are normal. No scleral icterus.  Neck: Neck supple.  Pulmonary/Chest: Left breast exhibits tenderness.  Lymphadenopathy:    She has no cervical adenopathy.  Neurological: She is alert and oriented to person, place, and time.  Skin: Skin is warm and dry.  Psychiatric: She has a normal mood and affect.    Data Reviewed CT of the chest dated 05/07/2017 reviewed:  IMPRESSION: 1. 3.7 x 5.5 cm soft tissue mass surrounding the left sternum/manubrium extending into the left anterior mediastinum and enlarged left axillary and left retroclavicular lymph nodes - highly suspicious for metastatic disease. 2. 1.1 x 1.6 cm possible posterior lower inner right breast mass. Recommend elective right diagnostic mammograms and ultrasound. 3. Question mild hepatic steatosis.  Ultrasound examination of the left axilla confirmed a large lymph node measuring 1.5 x 1.7 x 1.84 cm. The patient was amenable to core biopsy. 10 mL of 0.5% Xylocaine with 0.25% Marcaine with 1-200,000 of epinephrine was utilized and tolerated with moderate discomfort during installation.  ChloraPrep was applied to the skin. A 14-gauge Bard biopsy device was utilized and 3 core samples obtained with moderate discomfort which resolved quickly. No bleeding. Skin  defect was closed with benzoin and Steri-Strips. Sample sent for pathologic review with special attention to HER-2/neu testing.  Assessment    Recurrent left breast cancer with likely lymphatic obstruction producing   Recurrent right hip pain, possible bony metastatic disease.  Plan    Recurrent left breast cancer.  Possible new right breast abnormality.  Will postpone evaluation of the right breast pending today's biopsy and results of her upcoming PET scan.  If there is a significant difference in the pathology and hormone status between her original tumor and today's biopsy sample (assuming confirmation of axillary disease) would consider early evaluation of the right breast lesion. Her graft postbiopsy instructions provided to the patient and her husband who was present for the exam today.Marland Kitchen  HPI, Physical Exam, Assessment and Plan have been scribed under the direction and in the presence of [name], MD  Concepcion Living, LPN  I have completed the exam and reviewed the above documentation for accuracy and completeness.  I agree with the above.  Haematologist has been used and any errors in dictation or transcription are unintentional.  Hervey Ard, M.D., F.A.C.S.  [name] 05/11/2017, 9:28 PM

## 2017-05-11 NOTE — Patient Instructions (Signed)
CARE AFTER  BIOPSY  1. Leave the dressing on that your doctor applied after the biopsy. It is waterproof. You may bathe, shower and/or swim. The dressing can be removed in 3 days, you will see small strips of tape against your skin on the incision. Do not remove these strips they will gradually fall off in about 2-3 weeks. You may use an ice pack on and off for the first 12-24 hours for comfort.  2. You may want to use a gauze,cloth or similar protection in your bra to prevent rubbing against your dressing and incision. This is not necessary, but you may feel more comfortable doing so.  3. It is recommended that you wear a bra day and night to give support to the breast. This will prevent the weight of the breast from pulling on the incision.  4. Your breast may feel hard and lumpy under the incision. Do not be alarmed. This is the underlying stitching of tissue. Softening of this tissue will occur in time.  5. You may have a follow up appointment or phone follow up in one week after your biopsy. The office phone number is [phone].  6. You will notice about a week or two after your office visit that the strips of the tape on your incision will begin to loosen. These may then be removed.  7. Report to your doctor any of the following:  * Severe pain not relieved by your pain medication  *Redness of the incision  * Drainage from the incision  *Fever greater than 101 degrees  

## 2017-05-13 ENCOUNTER — Ambulatory Visit
Admission: RE | Admit: 2017-05-13 | Discharge: 2017-05-13 | Disposition: A | Discharge status: Home / Self Care | Payer: Medicare Other | Source: Ambulatory Visit | Attending: General Surgery | Admitting: General Surgery

## 2017-05-13 DIAGNOSIS — K760 Fatty (change of) liver, not elsewhere classified: Secondary | ICD-10-CM | POA: Insufficient documentation

## 2017-05-13 DIAGNOSIS — Z171 Estrogen receptor negative status [ER-]: Secondary | ICD-10-CM | POA: Diagnosis present

## 2017-05-13 DIAGNOSIS — I700 Atherosclerosis of aorta: Secondary | ICD-10-CM | POA: Insufficient documentation

## 2017-05-13 DIAGNOSIS — D259 Leiomyoma of uterus, unspecified: Secondary | ICD-10-CM | POA: Diagnosis not present

## 2017-05-13 DIAGNOSIS — C50812 Malignant neoplasm of overlapping sites of left female breast: Secondary | ICD-10-CM | POA: Insufficient documentation

## 2017-05-13 DIAGNOSIS — C7989 Secondary malignant neoplasm of other specified sites: Secondary | ICD-10-CM | POA: Diagnosis not present

## 2017-05-13 LAB — GLUCOSE, CAPILLARY: Glucose-Capillary: 89 mg/dL (ref 65–99)

## 2017-05-13 MED ORDER — FLUDEOXYGLUCOSE F - 18 (FDG) INJECTION
12.0000 | Freq: Once | INTRAVENOUS | Status: AC | PRN
  Administered 2017-05-13: 12.09 via INTRAVENOUS

## 2017-05-14 ENCOUNTER — Telehealth: Payer: Self-pay | Admitting: Internal Medicine

## 2017-05-14 ENCOUNTER — Inpatient Hospital Stay: Payer: Medicare Other

## 2017-05-14 ENCOUNTER — Inpatient Hospital Stay: Payer: Medicare Other | Attending: Internal Medicine | Admitting: Internal Medicine

## 2017-05-14 VITALS — BP 131/74 | HR 89 | Temp 97.4°F | Resp 14 | Wt 167.4 lb

## 2017-05-14 DIAGNOSIS — R270 Ataxia, unspecified: Secondary | ICD-10-CM | POA: Diagnosis not present

## 2017-05-14 DIAGNOSIS — C7981 Secondary malignant neoplasm of breast: Secondary | ICD-10-CM | POA: Insufficient documentation

## 2017-05-14 DIAGNOSIS — R0789 Other chest pain: Secondary | ICD-10-CM | POA: Diagnosis not present

## 2017-05-14 DIAGNOSIS — Z923 Personal history of irradiation: Secondary | ICD-10-CM | POA: Diagnosis not present

## 2017-05-14 DIAGNOSIS — Z95828 Presence of other vascular implants and grafts: Secondary | ICD-10-CM

## 2017-05-14 DIAGNOSIS — C773 Secondary and unspecified malignant neoplasm of axilla and upper limb lymph nodes: Secondary | ICD-10-CM | POA: Insufficient documentation

## 2017-05-14 DIAGNOSIS — R2681 Unsteadiness on feet: Secondary | ICD-10-CM | POA: Insufficient documentation

## 2017-05-14 DIAGNOSIS — C781 Secondary malignant neoplasm of mediastinum: Secondary | ICD-10-CM | POA: Diagnosis not present

## 2017-05-14 DIAGNOSIS — Z87891 Personal history of nicotine dependence: Secondary | ICD-10-CM | POA: Insufficient documentation

## 2017-05-14 DIAGNOSIS — C50212 Malignant neoplasm of upper-inner quadrant of left female breast: Secondary | ICD-10-CM | POA: Diagnosis present

## 2017-05-14 DIAGNOSIS — I700 Atherosclerosis of aorta: Secondary | ICD-10-CM | POA: Diagnosis not present

## 2017-05-14 DIAGNOSIS — Z171 Estrogen receptor negative status [ER-]: Secondary | ICD-10-CM

## 2017-05-14 DIAGNOSIS — Z9221 Personal history of antineoplastic chemotherapy: Secondary | ICD-10-CM | POA: Insufficient documentation

## 2017-05-14 DIAGNOSIS — C50812 Malignant neoplasm of overlapping sites of left female breast: Secondary | ICD-10-CM

## 2017-05-14 DIAGNOSIS — Z79899 Other long term (current) drug therapy: Secondary | ICD-10-CM | POA: Diagnosis not present

## 2017-05-14 DIAGNOSIS — C7989 Secondary malignant neoplasm of other specified sites: Secondary | ICD-10-CM | POA: Insufficient documentation

## 2017-05-14 DIAGNOSIS — F0390 Unspecified dementia without behavioral disturbance: Secondary | ICD-10-CM | POA: Diagnosis not present

## 2017-05-14 DIAGNOSIS — Z7189 Other specified counseling: Secondary | ICD-10-CM | POA: Insufficient documentation

## 2017-05-14 DIAGNOSIS — D259 Leiomyoma of uterus, unspecified: Secondary | ICD-10-CM | POA: Insufficient documentation

## 2017-05-14 DIAGNOSIS — K760 Fatty (change of) liver, not elsewhere classified: Secondary | ICD-10-CM | POA: Insufficient documentation

## 2017-05-14 LAB — COMPREHENSIVE METABOLIC PANEL
ALK PHOS: 53 U/L (ref 38–126)
ALT: 21 U/L (ref 14–54)
ANION GAP: 8 (ref 5–15)
AST: 23 U/L (ref 15–41)
Albumin: 4 g/dL (ref 3.5–5.0)
BUN: 13 mg/dL (ref 6–20)
CALCIUM: 9.1 mg/dL (ref 8.9–10.3)
CO2: 27 mmol/L (ref 22–32)
CREATININE: 0.63 mg/dL (ref 0.44–1.00)
Chloride: 99 mmol/L — ABNORMAL LOW (ref 101–111)
Glucose, Bld: 90 mg/dL (ref 65–99)
Potassium: 4.2 mmol/L (ref 3.5–5.1)
Sodium: 134 mmol/L — ABNORMAL LOW (ref 135–145)
Total Bilirubin: 0.6 mg/dL (ref 0.3–1.2)
Total Protein: 6.9 g/dL (ref 6.5–8.1)

## 2017-05-14 LAB — CBC WITH DIFFERENTIAL/PLATELET
BASOS ABS: 0 10*3/uL (ref 0–0.1)
Basophils Relative: 1 %
Eosinophils Absolute: 0.3 10*3/uL (ref 0–0.7)
Eosinophils Relative: 4 %
HEMATOCRIT: 38.9 % (ref 35.0–47.0)
Hemoglobin: 13.1 g/dL (ref 12.0–16.0)
Lymphocytes Relative: 14 %
Lymphs Abs: 0.9 10*3/uL — ABNORMAL LOW (ref 1.0–3.6)
MCH: 30.6 pg (ref 26.0–34.0)
MCHC: 33.5 g/dL (ref 32.0–36.0)
MCV: 91.4 fL (ref 80.0–100.0)
MONO ABS: 0.6 10*3/uL (ref 0.2–0.9)
MONOS PCT: 9 %
Neutro Abs: 4.6 10*3/uL (ref 1.4–6.5)
Neutrophils Relative %: 72 %
Platelets: 327 10*3/uL (ref 150–440)
RBC: 4.26 MIL/uL (ref 3.80–5.20)
RDW: 14.6 % — ABNORMAL HIGH (ref 11.5–14.5)
WBC: 6.4 10*3/uL (ref 3.6–11.0)

## 2017-05-14 MED ORDER — ONDANSETRON HCL 8 MG PO TABS: 8.0000 mg | ORAL_TABLET | Freq: Three times a day (TID) | ORAL | 1 refills | Status: DC | PRN

## 2017-05-14 MED ORDER — PROCHLORPERAZINE MALEATE 10 MG PO TABS: 10.0000 mg | ORAL_TABLET | Freq: Four times a day (QID) | ORAL | 1 refills | Status: DC | PRN

## 2017-05-14 MED ORDER — HEPARIN SOD (PORK) LOCK FLUSH 100 UNIT/ML IV SOLN
500.0000 [IU] | INTRAVENOUS | Status: AC | PRN
  Administered 2017-05-14: 500 [IU]

## 2017-05-14 MED ORDER — SODIUM CHLORIDE 0.9% FLUSH
10.0000 mL | INTRAVENOUS | Status: AC | PRN
  Administered 2017-05-14: 10 mL
  Filled 2017-05-14: qty 10

## 2017-05-14 NOTE — Assessment & Plan Note (Addendum)
#  New right breast metastatic invasive carcinoma  [Left breast previously ER/PR-Neg;  HER-2/neu receptor positive]- based on biopsy of the axillary lymph node. Awaiting on ER/PR HER-2/neu status. PET scan shows unfortunately- right breast mass; bilateral axillary lymph nodes; left supraclavicular lymph node; hilar/mediastinal adenopathy; periportal adenopathy- suggestive of stage IV cancer.  # If repeat biopsy is positive for HER-2/neu status- I would recommend Taxol Herceptin-perjeta. Discussed the potential side effects including but not limited to-increasing fatigue, nausea vomiting, diarrhea, hair loss, sores in the mouth, increase risk of infection and also neuropathy.   # Also discussed the patient and family that the treatment is going to be palliative; indefinite treatments. However quality of life has to be monitored closely- given her baseline borderline performance status/medical problems- including dementia/chronic ataxia etc.  # Dementia-worsening as per family/chronic debility/ataxia- await MRI of the brain. MUGA scan.  # labs/cbc/cmp/ca-27-29/cea/ca-15-3; next Taxol+herp+perjata; cbc-cmp/taxol/MD- in 2 weeks.discussed with [name].   # I reviewed the blood work- with the patient in detail; also reviewed the imaging independently [as summarized above]; and with the patient in detail.

## 2017-05-14 NOTE — Progress Notes (Signed)
[name] OFFICE PROGRESS NOTE  Patient Care Team: Michele Hector, MD as PCP - General (Internal Medicine) Michele Castilla Michele Gleason, MD (General Surgery) Requested, Self  Breast cancer metastasized to axillary lymph node [facility])   Staging form: Breast, [name], [name], cM0(i+)) - Signed by Michele Gleason, MD on 01/29/2015    Oncology History   # June 2016- LEFT BREAST CA;  [name] ; [name]] ; [name]; Her 2 Neu POS; [name] from July OF 2016; [name]; adjuvant Herceptin [ Finished July 2017]; AUG 2017- [name] x5 days; DISCON sec to diarrhea  # June 2017- left breast Bx- fat necrosis [[name]]  # MID OCT 2018- Right breast mass/ sternal mass/  # MUGA scan- July 28th 2017- 67%.  # chronic gait/balance issues  #july 2017-  BRCA 1& 2- NEG.      Carcinoma of overlapping sites of left breast in female, estrogen receptor negative ([place])     INTERVAL HISTORY:  [name] 76 y.o.  female pleasant patient above history of  Stage I  ER/[name] HER-2/neu positive breast cancer s/p Herceptin- Finished in July 2017.  Approximately 2 months ago patient had fall/syncope- attributed to orthostasis from [name]. Namenda has since been discontinued. However patient continues to have chest pressure since fall which has not been improving. This led to follow-up with surgery.   Physical exam noted to have a sternal soft tissue lesion; also axillary lymph nodes. CT scan showed sternal lesion mediastinal adenopathy; axillary adenopathy.   As per the patient/husband-dementia slightly getting worse. Unfortunately more recently patient is an difficulty Remembering conversations with the family- which is frustrating to her. She admits to mild chest discomfort; otherwise denies any significant pain. She has not been taking any pain medication.  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 She walks with a cane. Denies any unusual shortness of breath or cough. Denies any swelling in the legs.   REVIEW OF SYSTEMS:  A complete 10 point review of system is done which is negative except mentioned above/history of present illness.   PAST MEDICAL HISTORY :  Past Medical History:  Diagnosis Date  . Arthritis   . 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 Breast cancer ([place]) 2016  . Breast cancer of upper-inner quadrant of left female breast ([place]) 12/15/14   Completed radiation end of December and finished chemotherapy 2 weeks ago, Left breast invasive mammary carcinoma, T1cN21mc (1.5 cm); Grade 3, [name], [name], [name], .  .Marland KitchenCataract    bilat   . 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 Hearing loss   . History of cancer chemotherapy   . History of radiation therapy   . Imbalance   . Memory impairment    seen by [name] possible post crainiotomy from radiation  . Numbness and tingling    right hand   . Shoulder pain, right   . Wears glasses     PAST SURGICAL HISTORY :   Past Surgical History:  Procedure Laterality Date  . APPENDECTOMY  1950  . BRAIN SURGERY  1995   left frontal/temporal  . BREAST BIOPSY Left 1997  . BREAST BIOPSY Left 12/15/14   confirmed DCIS  . BREAST BIOPSY Left  01/08/2015   Procedure: BREAST BIOPSY WITH NEEDLE LOCALIZATION;  Surgeon: Michele Bellow, MD;  Location: [facility] ORS;  Service: General;  Laterality: Left;  . BREAST LUMPECTOMY Left 01/08/2015   Procedure: LUMPECTOMY;  Surgeon: Michele Bellow, MD;  Location: [facility] ORS;  Service: General;  Laterality: Left;  . COLONOSCOPY  2010   Michele. Tiffany [name]  . PORTACATH PLACEMENT Right 01/16/2015   Procedure: INSERTION PORT-A-CATH;  Surgeon: Michele Bellow, MD;  Location: [facility] ORS;  Service: General;  Laterality: Right;  . SENTINEL NODE BIOPSY Left 01/16/2015   Procedure: SENTINEL NODE BIOPSY;   Surgeon: Michele Bellow, MD;  Location: [facility] ORS;  Service: General;  Laterality: Left;  . TOTAL HIP ARTHROPLASTY Right 09/04/2015   Procedure: RIGHT TOTAL HIP ARTHROPLASTY ANTERIOR APPROACH;  Surgeon: Michele Cancel, MD;  Location: [facility];  Service: Orthopedics;  Laterality: Right;    FAMILY HISTORY :   Family History  Problem Relation Age of Onset  . Breast cancer Sister 38  . Addison's disease Mother   . Stroke Father     SOCIAL HISTORY:   Social History  Substance Use Topics  . Smoking status: Former Smoker    Packs/day: 0.25    Years: 5.00    Types: Cigarettes    Quit date: 07/28/1972  . Smokeless tobacco: Never Used  . Alcohol use 0.6 - 1.2 oz/week    1 - 2 Glasses of wine per week     Comment: 1 Glass Wine / Night    ALLERGIES:  is allergic to donepezil.  MEDICATIONS:  Current Outpatient Prescriptions  Medication Sig Dispense Refill  . Ascorbic Acid (VITAMIN C) 100 MG tablet Take 100 mg by mouth every morning.     . Calcium Carbonate-Vit D-Min (CALTRATE 600+D PLUS MINERALS) 600-800 MG-UNIT TABS Take 1 tablet by mouth every morning.    . ETODOLAC PO Take 1 tablet by mouth daily.    . ferrous sulfate 325 (65 FE) MG tablet Take 1 tablet (325 mg total) by mouth 3 (three) times daily after meals.  3  . lidocaine-prilocaine (EMLA) cream Apply 1 application topically as needed. 30 g 3  . loratadine (CLARITIN) 10 MG tablet Take 10 mg by mouth daily as needed.     . polyvinyl alcohol (LIQUIFILM TEARS) 1.4 % ophthalmic solution Place 1 drop into both eyes as needed for dry eyes.     . traMADol (ULTRAM) 50 MG tablet Take 50 mg by mouth as needed.     . vitamin E 400 UNIT capsule Take 400 Units by mouth daily.     . ondansetron (ZOFRAN) 8 MG tablet Take 1 tablet (8 mg total) by mouth every 8 (eight) hours as needed for nausea or vomiting (start 3 days; after chemo). 40 tablet 1  . prochlorperazine (COMPAZINE) 10 MG tablet Take 1 tablet (10 mg total) by mouth every 6 (six) hours as  needed for nausea or vomiting. 40 tablet 1   No current facility-administered medications for this visit.     PHYSICAL EXAMINATION: ECOG PERFORMANCE STATUS: 1 - Symptomatic but completely ambulatory  BP 131/74 (BP Location: Left Arm, Patient Position: Sitting)   Pulse 89   Temp (!) 97.4 F (36.3 C) (Tympanic)   Resp 14   Wt 167 lb 6.4 oz (75.9 kg)   BMI 31.63 kg/m   Filed Weights   05/14/17 1353  Weight: 167 lb 6.4 oz (75.9 kg)    GENERAL: Well-nourished well-developed; Alert, no distress and comfortable.  With her husband.  Walks with a cane. EYES: no pallor or icterus OROPHARYNX: no thrush or ulceration; good dentition  NECK: supple, no masses felt LYMPH:  palpable lymphadenopathy Left supraclavicular region ; left cervical region. Adenopathy felt in the left axillary.  LUNGS: clear to auscultation and  No wheeze or crackles HEART/CVS: regular rate & rhythm and no murmurs; No lower extremity edema ABDOMEN:abdomen soft, non-tender and normal bowel sounds Musculoskeletal:no cyanosis of digits and no clubbing; 3-4 cm raised lesion noted in the sternal area.  PSYCH: alert & oriented x 3 with fluent speech NEURO: no focal motor/sensory deficits SKIN:  no rashes or significant lesions  LABORATORY DATA:  I have reviewed the data as listed    Component Value Date/Time   NA 134 (L) 05/14/2017 1504   K 4.2 05/14/2017 1504   CL 99 (L) 05/14/2017 1504   CO2 27 05/14/2017 1504   GLUCOSE 90 05/14/2017 1504   BUN 13 05/14/2017 1504   CREATININE 0.63 05/14/2017 1504   CALCIUM 9.1 05/14/2017 1504   PROT 6.9 05/14/2017 1504   ALBUMIN 4.0 05/14/2017 1504   AST 23 05/14/2017 1504   ALT 21 05/14/2017 1504   ALKPHOS 53 05/14/2017 1504   BILITOT 0.6 05/14/2017 1504   GFRNONAA >60 05/14/2017 1504   GFRAA >60 05/14/2017 1504    No results found for: SPEP, UPEP  Lab Results  Component Value Date   WBC 6.4 05/14/2017   NEUTROABS 4.6 05/14/2017   HGB 13.1 05/14/2017   HCT 38.9  05/14/2017   MCV 91.4 05/14/2017   PLT 327 05/14/2017      Chemistry      Component Value Date/Time   NA 134 (L) 05/14/2017 1504   K 4.2 05/14/2017 1504   CL 99 (L) 05/14/2017 1504   CO2 27 05/14/2017 1504   BUN 13 05/14/2017 1504   CREATININE 0.63 05/14/2017 1504      Component Value Date/Time   CALCIUM 9.1 05/14/2017 1504   ALKPHOS 53 05/14/2017 1504   AST 23 05/14/2017 1504   ALT 21 05/14/2017 1504   BILITOT 0.6 05/14/2017 1504     No  focal wall motion abnormality of the left ventricle. Calculated left ventricular ejection fraction equals 67%. Previously 61% on 11/13/2015 IMPRESSION: Left ventricular ejection fraction equals 67 %. Electronically Signed   By: Suzy Bouchard M.D.   On: 02/20/2016 15:02  RADIOGRAPHIC STUDIES: I have personally reviewed the radiological images as listed and agreed with the findings in the report. Nm Pet Image Initial (pi) Skull Base To Thigh  Result Date: 05/13/2017 CLINICAL DATA:  Subsequent treatment strategy for left-sided breast cancer. Left breast cancer and lumpectomy. Chest wall mass on CT. EXAM: NUCLEAR MEDICINE PET SKULL BASE TO THIGH TECHNIQUE: 12.1 mCi F-18 FDG was injected intravenously. Full-ring PET imaging was performed from the skull base to thigh after the radiotracer. CT data was obtained and used for attenuation correction and anatomic localization. FASTING BLOOD GLUCOSE:  Value: 89 mg/dl COMPARISON:  05/07/2017 chest CT. FINDINGS: NECK: Hypermetabolic left-sided cervical nodes, including a a posterior triangle node which measures 10 mm and a S.U.V. max of 9.3 on image 35/series 3. No other findings within the neck. CHEST: Bilateral axillary hypermetabolic adenopathy. Index left axillary node measures 1.3 cm and a S.U.V. max of 11.4 on image 64/series 3. A right axillary node measures 9 mm and a S.U.V. max of 8.0 on image 72/series 3. Hypermetabolism corresponding to the inferior right breast nodule. This measures 1.7 cm and  a S.U.V.  max of 17.4 on image 108/ series 3. Hypermetabolic mass surrounding the left side of the sternum. This measures on the order of 3.9 cm and a S.U.V. max of 20.2 on image 77/series 3. Mediastinal hypermetabolic adenopathy, including node at a high anterior mediastinum which measures 1.0 cm and a S.U.V. max of 10.7 on image 65/series 3. Bilateral hilar hypermetabolism is mild ( a S.U.V. max of 3.5 on the right). Corresponding upper normal hilar nodal tissue on recent CT. Equivocal. Right Port-A-Cath which terminates at the mid right atrium. ABDOMEN/PELVIS: Portacaval hypermetabolic node which measures 1.0 cm and a S.U.V. max of 6.3 on image 142/series 3. No other abdominopelvic parenchymal or nodal hypermetabolism. Hepatic steatosis is moderate. Abdominal aortic atherosclerosis. Right adnexal/ovarian 3.8 cm low-density lesion. Calcified uterine fibroids. Pelvic floor laxity. SKELETON: No abnormal marrow activity.  Right hip arthroplasty. IMPRESSION: 1. Hypermetabolic recurrent/metastatic disease within the neck, chest, abdomen as detailed above. 2.  Aortic Atherosclerosis (ICD10-I70.0). 3. Hepatic steatosis. 4. Uterine fibroids. 5. 3.8 cm simple appearing right adnexal/ovarian lesion should be considered for nonemergent pelvic ultrasound. Electronically Signed   By: Abigail Miyamoto M.D.   On: 05/13/2017 09:46    IMPRESSION: 1. Hypermetabolic recurrent/metastatic disease within the neck, chest, abdomen as detailed above. 2.  Aortic Atherosclerosis (ICD10-I70.0). 3. Hepatic steatosis. 4. Uterine fibroids. 5. 3.8 cm simple appearing right adnexal/ovarian lesion should be considered for nonemergent pelvic ultrasound.   Electronically Signed   By: Abigail Miyamoto M.D.   On: 05/13/2017 09:46 ASSESSMENT & PLAN:  Carcinoma of overlapping sites of left breast in female, estrogen receptor negative ([place]) # New right breast metastatic invasive carcinoma  [Left breast previously ER/PR-Neg;  HER-2/neu receptor  positive]- based on biopsy of the axillary lymph node. Awaiting on ER/PR HER-2/neu status. PET scan shows unfortunately- right breast mass; bilateral axillary lymph nodes; left supraclavicular lymph node; hilar/mediastinal adenopathy; periportal adenopathy- suggestive of stage IV cancer.  # If repeat biopsy is positive for HER-2/neu status- I would recommend Taxol Herceptin-perjeta. Discussed the potential side effects including but not limited to-increasing fatigue, nausea vomiting, diarrhea, hair loss, sores in the mouth, increase risk of infection and also neuropathy.   # Also discussed the patient and family that the treatment is going to be palliative; indefinite treatments. However quality of life has to be monitored closely- given her baseline borderline performance status/medical problems- including dementia/chronic ataxia etc.  # Dementia-worsening as per family/chronic debility/ataxia- await MRI of the brain. MUGA scan.  # labs/cbc/cmp/ca-27-29/cea/ca-15-3; next Taxol+herp+perjata; cbc-cmp/taxol/MD- in 2 weeks.discussed with [name].   # I reviewed the blood work- with the patient in detail; also reviewed the imaging independently [as summarized above]; and with the patient in detail.    Orders Placed This Encounter  Procedures  . NM Cardiac Muga Rest    Standing Status:   Future    Standing Expiration Date:   05/14/2018    Order Specific Question:   caspofungin (CANCIDAS) frequency    Answer:   Q 24H    Order Specific Question:   Preferred imaging location?    Answer:   [facility]    Order Specific Question:   Call Results- Best Contact Number?    Answer:   cardiotoxic chemo    Order Specific Question:   Radiology Contrast Protocol - do NOT remove file path    Answer:   \\[email or web address]  . MR Brain W Wo Contrast    Standing Status:   Future    Standing Expiration Date:   05/14/2018  Order Specific Question:   If indicated for the ordered  procedure, I authorize the administration of contrast media per Radiology protocol    Answer:   Yes    Order Specific Question:   What is the patient's sedation requirement?    Answer:   No Sedation    Order Specific Question:   Does the patient have a pacemaker or implanted devices?    Answer:   No    Order Specific Question:   Radiology Contrast Protocol - do NOT remove file path    Answer:   \\[email or web address]    Order Specific Question:   Reason for Exam additional comments    Answer:   breast cancer    Order Specific Question:   Preferred imaging location?    Answer:   [facility] (table limit-300lbs)  . CBC with Differential    Standing Status:   Future    Number of Occurrences:   1    Standing Expiration Date:   05/14/2018  . Comprehensive metabolic panel    Standing Status:   Future    Number of Occurrences:   1    Standing Expiration Date:   05/14/2018  . Cancer antigen 27.29    Standing Status:   Future    Number of Occurrences:   1    Standing Expiration Date:   05/14/2018  . Cancer antigen 15-3    Standing Status:   Future    Number of Occurrences:   1    Standing Expiration Date:   05/14/2018  . CEA    Standing Status:   Future    Number of Occurrences:   1    Standing Expiration Date:   05/14/2018  . CBC with Differential    Standing Status:   Future    Standing Expiration Date:   05/14/2018  . Comprehensive metabolic panel    Standing Status:   Future    Standing Expiration Date:   05/14/2018   All questions were answered. The patient knows to call the clinic with any problems, questions or concerns.      Cammie Sickle, MD 05/14/2017 5:48 PM

## 2017-05-14 NOTE — Telephone Encounter (Signed)
FYI- I have not ordered the chemotherapy in the computer; as I'm still awaiting on the ER/PR HER-2/neu status. I will order it as soon I get the results.

## 2017-05-15 ENCOUNTER — Ambulatory Visit
Admission: RE | Admit: 2017-05-15 | Discharge: 2017-05-15 | Disposition: A | Discharge status: Home / Self Care | Payer: Medicare Other | Source: Ambulatory Visit | Attending: Internal Medicine | Admitting: Internal Medicine

## 2017-05-15 DIAGNOSIS — Z171 Estrogen receptor negative status [ER-]: Secondary | ICD-10-CM | POA: Diagnosis present

## 2017-05-15 DIAGNOSIS — R2681 Unsteadiness on feet: Secondary | ICD-10-CM

## 2017-05-15 DIAGNOSIS — C50812 Malignant neoplasm of overlapping sites of left female breast: Secondary | ICD-10-CM | POA: Insufficient documentation

## 2017-05-15 LAB — CEA: CEA1: 1.6 ng/mL (ref 0.0–4.7)

## 2017-05-15 LAB — CANCER ANTIGEN 15-3: CAN 15 3: 20.3 U/mL (ref 0.0–25.0)

## 2017-05-15 LAB — CANCER ANTIGEN 27.29: CA 27.29: 24.3 U/mL (ref 0.0–38.6)

## 2017-05-15 MED ORDER — TECHNETIUM TC 99M-LABELED RED BLOOD CELLS IV KIT
20.0000 | PACK | Freq: Once | INTRAVENOUS | Status: AC | PRN
  Administered 2017-05-15: 23.09 via INTRAVENOUS

## 2017-05-15 NOTE — Telephone Encounter (Signed)
Note-will fwd this to the PA team, so that they are aware.

## 2017-05-18 ENCOUNTER — Other Ambulatory Visit: Payer: Self-pay | Admitting: Internal Medicine

## 2017-05-18 ENCOUNTER — Telehealth: Payer: Self-pay | Admitting: Internal Medicine

## 2017-05-18 ENCOUNTER — Ambulatory Visit
Admission: RE | Admit: 2017-05-18 | Discharge: 2017-05-18 | Disposition: A | Discharge status: Home / Self Care | Payer: Medicare Other | Source: Ambulatory Visit | Attending: Internal Medicine | Admitting: Internal Medicine

## 2017-05-18 DIAGNOSIS — C50812 Malignant neoplasm of overlapping sites of left female breast: Secondary | ICD-10-CM | POA: Insufficient documentation

## 2017-05-18 DIAGNOSIS — R2681 Unsteadiness on feet: Secondary | ICD-10-CM | POA: Diagnosis not present

## 2017-05-18 DIAGNOSIS — Z9889 Other specified postprocedural states: Secondary | ICD-10-CM | POA: Insufficient documentation

## 2017-05-18 DIAGNOSIS — Z171 Estrogen receptor negative status [ER-]: Secondary | ICD-10-CM | POA: Insufficient documentation

## 2017-05-18 DIAGNOSIS — I6782 Cerebral ischemia: Secondary | ICD-10-CM | POA: Insufficient documentation

## 2017-05-18 MED ORDER — GADOBENATE DIMEGLUMINE 529 MG/ML IV SOLN
15.0000 mL | Freq: Once | INTRAVENOUS | Status: AC | PRN
  Administered 2017-05-18: 15 mL via INTRAVENOUS

## 2017-05-18 NOTE — Progress Notes (Signed)
Patient on plan of care prior to pathways. 

## 2017-05-18 NOTE — Progress Notes (Signed)
START ON PATHWAY REGIMEN - Breast     A cycle is every 21 days:     Pertuzumab      Pertuzumab      Trastuzumab      Trastuzumab      Paclitaxel   **Always confirm dose/schedule in your pharmacy ordering system**    Patient Characteristics: Metastatic Chemotherapy, HER2 Positive, ER Negative/Unknown, First Line Therapeutic Status: Distant Metastases BRCA Mutation Status: Did Not Order Test ER Status: Unknown HER2 Status: Positive (+) Would you be surprised if this patient died  in the next year<= I would be surprised if this patient died in the next year PR Status: Unknown Line of therapy: First Line Intent of Therapy: Non-Curative / Palliative Intent, Discussed with Patient

## 2017-05-18 NOTE — Telephone Encounter (Signed)
Called pathology- Spoke to Dr.Patrick re: breast cancer profile- Her 2 neu POSITIVE; ER/PR- pending. Will plan to get started on chemo.   Will decide on repeat right breast biopsy based on ER/PR status.

## 2017-05-20 ENCOUNTER — Encounter: Payer: Self-pay | Admitting: General Surgery

## 2017-05-20 ENCOUNTER — Encounter: Payer: Self-pay | Admitting: *Deleted

## 2017-05-20 ENCOUNTER — Inpatient Hospital Stay: Payer: Self-pay

## 2017-05-20 ENCOUNTER — Ambulatory Visit (INDEPENDENT_AMBULATORY_CARE_PROVIDER_SITE_OTHER): Payer: Medicare Other | Admitting: General Surgery

## 2017-05-20 VITALS — BP 133/79 | HR 80 | Resp 14 | Ht 61.0 in | Wt 167.0 lb

## 2017-05-20 DIAGNOSIS — C50912 Malignant neoplasm of unspecified site of left female breast: Secondary | ICD-10-CM

## 2017-05-20 DIAGNOSIS — N6314 Unspecified lump in the right breast, lower inner quadrant: Secondary | ICD-10-CM | POA: Insufficient documentation

## 2017-05-20 DIAGNOSIS — C773 Secondary and unspecified malignant neoplasm of axilla and upper limb lymph nodes: Secondary | ICD-10-CM

## 2017-05-20 NOTE — Progress Notes (Signed)
Patient ID: [name], female   DOB: 1940/11/16, 76 y.o.   MRN: [id number]  Chief Complaint  Patient presents with  . Procedure    right breast biopsy    HPI [name] is a 76 y.o. female here for a scheduled biopsy of the right breast.  HPI  Past Medical History:  Diagnosis Date  . Arthritis   . 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 Breast cancer ([place]) 2016  . Breast cancer of upper-inner quadrant of left female breast ([place]) 12/15/14   Completed radiation end of December and finished chemotherapy 2 weeks ago, Left breast invasive mammary carcinoma, T1cN28mc (1.5 cm); Grade 3, [name], [name], [name], .  .Marland KitchenCataract    bilat   . 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 Hearing loss   . History of cancer chemotherapy   . History of radiation therapy   . Imbalance   . Memory impairment    seen by Dr SManuella Ghazi possible post crainiotomy from radiation  . Numbness and tingling    right hand   . Shoulder pain, right   . Wears glasses     Past Surgical History:  Procedure Laterality Date  . APPENDECTOMY  1950  . BRAIN SURGERY  1995   left frontal/temporal  . BREAST BIOPSY Left 1997  . BREAST BIOPSY Left 12/15/14   confirmed DCIS  . BREAST BIOPSY Left 01/08/2015   Procedure: BREAST BIOPSY WITH NEEDLE LOCALIZATION;  Surgeon: [name] MD;  Location: [facility] ORS;  Service: General;  Laterality: Left;  . BREAST LUMPECTOMY Left 01/08/2015   Procedure: LUMPECTOMY;  Surgeon: [name] MD;  Location: [facility]S;  Service: General;  Laterality: Left;  . COLONOSCOPY  2010   Dr. ETiffany Kocher . PORTACATH PLACEMENT Right 01/16/2015   Procedure: INSERTION PORT-A-CATH;  Surgeon: [name] MD;  Location: [facility];  Service: General;  Laterality: Right;  . SENTINEL NODE BIOPSY Left 01/16/2015   Procedure: SENTINEL NODE BIOPSY;  Surgeon: [name] MD;  Location: [facility];  Service: General;  Laterality: Left;  .  TOTAL HIP ARTHROPLASTY Right 09/04/2015   Procedure: RIGHT TOTAL HIP ARTHROPLASTY ANTERIOR APPROACH;  Surgeon: MParalee Cancel MD;  Location: [facility];  Service: Orthopedics;  Laterality: Right;    Family History  Problem Relation Age of Onset  . Breast cancer Sister 457 . Addison's disease Mother   . Stroke Father     Social History Social History  Substance Use Topics  . Smoking status: Former Smoker    Packs/day: 0.25    Years: 5.00    Types: Cigarettes    Quit date: 07/28/1972  . Smokeless tobacco: Never Used  . Alcohol use 0.6 - 1.2 oz/week    1 - 2 Glasses of wine per week     Comment: 1 Glass Wine / Night    Allergies  Allergen Reactions  . Donepezil     Other reaction(s): Other (See Comments) Nightmares    Current Outpatient Prescriptions  Medication Sig Dispense Refill  . Ascorbic Acid (VITAMIN C) 100 MG tablet Take 100 mg by mouth every morning.     . Calcium Carbonate-Vit D-Min (CALTRATE 600+D PLUS MINERALS) 600-800 MG-UNIT TABS Take 1 tablet by mouth every morning.    . ETODOLAC PO Take 1 tablet by mouth daily.    . ferrous sulfate 325 (65 FE) MG tablet Take  1 tablet (325 mg total) by mouth 3 (three) times daily after meals.  3  . lidocaine-prilocaine (EMLA) cream Apply 1 application topically as needed. 30 g 3  . loratadine (CLARITIN) 10 MG tablet Take 10 mg by mouth daily as needed.     . ondansetron (ZOFRAN) 8 MG tablet Take 1 tablet (8 mg total) by mouth every 8 (eight) hours as needed for nausea or vomiting (start 3 days; after chemo). 40 tablet 1  . polyvinyl alcohol (LIQUIFILM TEARS) 1.4 % ophthalmic solution Place 1 drop into both eyes as needed for dry eyes.     Marland Kitchen prochlorperazine (COMPAZINE) 10 MG tablet Take 1 tablet (10 mg total) by mouth every 6 (six) hours as needed for nausea or vomiting. 40 tablet 1  . traMADol (ULTRAM) 50 MG tablet Take 50 mg by mouth as needed.     . vitamin E 400 UNIT capsule Take 400 Units by mouth daily.      No current  facility-administered medications for this visit.     Review of Systems Review of Systems  Constitutional: Negative.   Respiratory: Negative.   Cardiovascular: Negative.     Blood pressure 133/79, pulse 80, resp. rate 14, height _0  (1.549 m), weight 167 lb (75.8 kg).  Physical Exam Physical Exam  Pulmonary/Chest: Effort normal and breath sounds normal.    Postbiopsy exam normal.    Data Reviewed Chest CT of 05/07/2017 showed a mass in the posterior inferior aspect of the right breast just above the chest wall.  PET/CT of 05/13/2017 showed this to be a hypermetabolic lesion.  In discussion with medical oncology, biopsy of the right breast lesion has been requested to determine if this is a second primary or metastatic disease.  Ultrasound examination of the right breast in the 5:00 position, 10 cm from the nipple showed a deeply situated hypoechoic mass that was tolerated and it was wide measuring 1.57 x 1.62 x 1.67 cm. This correlated with a 1.1 x 1.6 cm mass in the posterior aspect of the breast noted on the CT scan noted above.  Biopsy procedure was reviewed with the patient and she was amenable to proceed  Alcohol prep followed by 10 mL of 0.5% Xylocaine with 0.25% Marcaine with 1-200,000 units epinephrine well tolerated.  A 14-gauge Bard biopsy device was used and 3 core samples were obtained. No bleeding. Postbiopsy clip placed. Discomfort with only one of 3 biopsy passes. Needle tip did not appear to violate the intercostal muscles. No associated shortness of breath.  Skin defect closed with benzoin and Steri-Strips followed by Telfa and Tegaderm dressing. Ice applied. Postbiopsy instructions reviewed.  Assessment    Right breast mass, metastatic disease versus new primary.    Plan    The patient will be contacted when biopsy results are available.  The patient's husband, I physician, has been encouraged to call promptly if the patient experiences any shortness  of breath.    HPI, Physical Exam, Assessment and Plan have been scribed under the direction and in the presence of [name], MD  Concepcion Living, LPN I have completed the exam and reviewed the above documentation for accuracy and completeness.  I agree with the above.  Haematologist has been used and any errors in dictation or transcription are unintentional.  Hervey Ard, M.D., F.A.C.S.   [name] 05/20/2017, 1:05 PM

## 2017-05-20 NOTE — Patient Instructions (Signed)

## 2017-05-21 ENCOUNTER — Inpatient Hospital Stay: Payer: Medicare Other

## 2017-05-21 VITALS — BP 138/84 | HR 80 | Temp 97.2°F | Resp 18

## 2017-05-21 DIAGNOSIS — C50212 Malignant neoplasm of upper-inner quadrant of left female breast: Secondary | ICD-10-CM | POA: Diagnosis not present

## 2017-05-21 DIAGNOSIS — Z171 Estrogen receptor negative status [ER-]: Principal | ICD-10-CM

## 2017-05-21 DIAGNOSIS — C50812 Malignant neoplasm of overlapping sites of left female breast: Secondary | ICD-10-CM

## 2017-05-21 MED ORDER — HEPARIN SOD (PORK) LOCK FLUSH 100 UNIT/ML IV SOLN
500.0000 [IU] | Freq: Once | INTRAVENOUS | Status: AC | PRN
  Administered 2017-05-21: 500 [IU]

## 2017-05-21 MED ORDER — TRASTUZUMAB CHEMO 150 MG IV SOLR
600.0000 mg | Freq: Once | INTRAVENOUS | Status: AC
  Administered 2017-05-21: 600 mg via INTRAVENOUS
  Filled 2017-05-21: qty 28.57

## 2017-05-21 MED ORDER — SODIUM CHLORIDE 0.9 % IV SOLN
20.0000 mg | Freq: Once | INTRAVENOUS | Status: AC
  Administered 2017-05-21: 20 mg via INTRAVENOUS
  Filled 2017-05-21: qty 2

## 2017-05-21 MED ORDER — DIPHENHYDRAMINE HCL 50 MG/ML IJ SOLN
50.0000 mg | Freq: Once | INTRAMUSCULAR | Status: AC
  Administered 2017-05-21: 50 mg via INTRAVENOUS
  Filled 2017-05-21: qty 1

## 2017-05-21 MED ORDER — ACETAMINOPHEN 325 MG PO TABS
650.0000 mg | ORAL_TABLET | Freq: Once | ORAL | Status: AC
  Administered 2017-05-21: 650 mg via ORAL
  Filled 2017-05-21: qty 2

## 2017-05-21 MED ORDER — FAMOTIDINE IN NACL 20-0.9 MG/50ML-% IV SOLN
20.0000 mg | Freq: Once | INTRAVENOUS | Status: AC
  Administered 2017-05-21: 20 mg via INTRAVENOUS
  Filled 2017-05-21: qty 50

## 2017-05-21 MED ORDER — SODIUM CHLORIDE 0.9 % IV SOLN
Freq: Once | INTRAVENOUS | Status: AC
  Administered 2017-05-21: 09:00:00 via INTRAVENOUS
  Filled 2017-05-21: qty 1000

## 2017-05-21 MED ORDER — SODIUM CHLORIDE 0.9 % IV SOLN
840.0000 mg | Freq: Once | INTRAVENOUS | Status: AC
  Administered 2017-05-21: 840 mg via INTRAVENOUS
  Filled 2017-05-21: qty 28

## 2017-05-21 MED ORDER — PACLITAXEL CHEMO INJECTION 300 MG/50ML
80.0000 mg/m2 | Freq: Once | INTRAVENOUS | Status: AC
  Administered 2017-05-21: 144 mg via INTRAVENOUS
  Filled 2017-05-21: qty 24

## 2017-05-26 ENCOUNTER — Telehealth: Payer: Self-pay | Admitting: Internal Medicine

## 2017-05-26 NOTE — Telephone Encounter (Signed)
Spoke to Dr. Orene Desanctis. Re: Bx.

## 2017-05-26 NOTE — Telephone Encounter (Signed)
Left message to speak to Dr.Lane re: path.

## 2017-05-28 ENCOUNTER — Inpatient Hospital Stay: Payer: Medicare Other

## 2017-05-28 ENCOUNTER — Inpatient Hospital Stay (HOSPITAL_BASED_OUTPATIENT_CLINIC_OR_DEPARTMENT_OTHER): Payer: Medicare Other | Admitting: Internal Medicine

## 2017-05-28 ENCOUNTER — Inpatient Hospital Stay: Payer: Medicare Other | Attending: Internal Medicine

## 2017-05-28 VITALS — BP 105/73 | HR 156 | Resp 16 | Wt 166.4 lb

## 2017-05-28 VITALS — BP 126/77 | HR 80 | Temp 98.2°F | Resp 20

## 2017-05-28 DIAGNOSIS — K760 Fatty (change of) liver, not elsewhere classified: Secondary | ICD-10-CM | POA: Insufficient documentation

## 2017-05-28 DIAGNOSIS — C50812 Malignant neoplasm of overlapping sites of left female breast: Secondary | ICD-10-CM

## 2017-05-28 DIAGNOSIS — R040 Epistaxis: Secondary | ICD-10-CM | POA: Diagnosis not present

## 2017-05-28 DIAGNOSIS — F0390 Unspecified dementia without behavioral disturbance: Secondary | ICD-10-CM | POA: Diagnosis not present

## 2017-05-28 DIAGNOSIS — Z171 Estrogen receptor negative status [ER-]: Secondary | ICD-10-CM

## 2017-05-28 DIAGNOSIS — Z923 Personal history of irradiation: Secondary | ICD-10-CM

## 2017-05-28 DIAGNOSIS — C50212 Malignant neoplasm of upper-inner quadrant of left female breast: Secondary | ICD-10-CM | POA: Insufficient documentation

## 2017-05-28 DIAGNOSIS — L739 Follicular disorder, unspecified: Secondary | ICD-10-CM | POA: Diagnosis not present

## 2017-05-28 DIAGNOSIS — Z79899 Other long term (current) drug therapy: Secondary | ICD-10-CM | POA: Diagnosis not present

## 2017-05-28 DIAGNOSIS — R05 Cough: Secondary | ICD-10-CM | POA: Insufficient documentation

## 2017-05-28 DIAGNOSIS — Z87891 Personal history of nicotine dependence: Secondary | ICD-10-CM | POA: Insufficient documentation

## 2017-05-28 DIAGNOSIS — Z5111 Encounter for antineoplastic chemotherapy: Secondary | ICD-10-CM | POA: Diagnosis not present

## 2017-05-28 DIAGNOSIS — D259 Leiomyoma of uterus, unspecified: Secondary | ICD-10-CM | POA: Insufficient documentation

## 2017-05-28 DIAGNOSIS — R0982 Postnasal drip: Secondary | ICD-10-CM | POA: Diagnosis not present

## 2017-05-28 DIAGNOSIS — C7981 Secondary malignant neoplasm of breast: Secondary | ICD-10-CM

## 2017-05-28 DIAGNOSIS — R197 Diarrhea, unspecified: Secondary | ICD-10-CM | POA: Diagnosis not present

## 2017-05-28 DIAGNOSIS — I700 Atherosclerosis of aorta: Secondary | ICD-10-CM | POA: Insufficient documentation

## 2017-05-28 LAB — COMPREHENSIVE METABOLIC PANEL
ALBUMIN: 4.1 g/dL (ref 3.5–5.0)
ALT: 42 U/L (ref 14–54)
ANION GAP: 8 (ref 5–15)
AST: 35 U/L (ref 15–41)
Alkaline Phosphatase: 56 U/L (ref 38–126)
BILIRUBIN TOTAL: 0.6 mg/dL (ref 0.3–1.2)
BUN: 15 mg/dL (ref 6–20)
CO2: 28 mmol/L (ref 22–32)
Calcium: 8.9 mg/dL (ref 8.9–10.3)
Chloride: 100 mmol/L — ABNORMAL LOW (ref 101–111)
Creatinine, Ser: 0.75 mg/dL (ref 0.44–1.00)
GFR calc Af Amer: >60 mL/min (ref >60)
GLUCOSE: 128 mg/dL — AB (ref 65–99)
POTASSIUM: 3.9 mmol/L (ref 3.5–5.1)
Sodium: 136 mmol/L (ref 135–145)
TOTAL PROTEIN: 6.8 g/dL (ref 6.5–8.1)

## 2017-05-28 LAB — CBC WITH DIFFERENTIAL/PLATELET
Basophils Absolute: 0 10*3/uL (ref 0–0.1)
Basophils Relative: 1 %
Eosinophils Absolute: 0.2 10*3/uL (ref 0–0.7)
Eosinophils Relative: 5 %
HCT: 38.7 % (ref 35.0–47.0)
Hemoglobin: 13.1 g/dL (ref 12.0–16.0)
LYMPHS PCT: 13 %
Lymphs Abs: 0.6 10*3/uL — ABNORMAL LOW (ref 1.0–3.6)
MCH: 31.2 pg (ref 26.0–34.0)
MCHC: 33.9 g/dL (ref 32.0–36.0)
MCV: 91.9 fL (ref 80.0–100.0)
Monocytes Absolute: 0.2 10*3/uL (ref 0.2–0.9)
Monocytes Relative: 5 %
NEUTROS ABS: 3.5 10*3/uL (ref 1.4–6.5)
Neutrophils Relative %: 76 %
PLATELETS: 342 10*3/uL (ref 150–440)
RBC: 4.21 MIL/uL (ref 3.80–5.20)
RDW: 14.2 % (ref 11.5–14.5)
WBC: 4.6 10*3/uL (ref 3.6–11.0)

## 2017-05-28 MED ORDER — PACLITAXEL CHEMO INJECTION 300 MG/50ML
80.0000 mg/m2 | Freq: Once | INTRAVENOUS | Status: AC
  Administered 2017-05-28: 144 mg via INTRAVENOUS
  Filled 2017-05-28: qty 24

## 2017-05-28 MED ORDER — DEXAMETHASONE SODIUM PHOSPHATE 100 MG/10ML IJ SOLN
20.0000 mg | Freq: Once | INTRAMUSCULAR | Status: AC
  Administered 2017-05-28: 20 mg via INTRAVENOUS
  Filled 2017-05-28: qty 2

## 2017-05-28 MED ORDER — DIPHENHYDRAMINE HCL 50 MG/ML IJ SOLN
50.0000 mg | Freq: Once | INTRAMUSCULAR | Status: AC
  Administered 2017-05-28: 50 mg via INTRAVENOUS
  Filled 2017-05-28: qty 1

## 2017-05-28 MED ORDER — HEPARIN SOD (PORK) LOCK FLUSH 100 UNIT/ML IV SOLN
500.0000 [IU] | Freq: Once | INTRAVENOUS | Status: AC
  Administered 2017-05-28: 500 [IU] via INTRAVENOUS
  Filled 2017-05-28: qty 5

## 2017-05-28 MED ORDER — FAMOTIDINE IN NACL 20-0.9 MG/50ML-% IV SOLN
20.0000 mg | Freq: Once | INTRAVENOUS | Status: AC
  Administered 2017-05-28: 20 mg via INTRAVENOUS
  Filled 2017-05-28: qty 50

## 2017-05-28 MED ORDER — SODIUM CHLORIDE 0.9 % IV SOLN
Freq: Once | INTRAVENOUS | Status: AC
  Administered 2017-05-28: 10:00:00 via INTRAVENOUS
  Filled 2017-05-28: qty 1000

## 2017-05-28 MED ORDER — SODIUM CHLORIDE 0.9% FLUSH
10.0000 mL | INTRAVENOUS | Status: DC | PRN
  Administered 2017-05-28: 10 mL via INTRAVENOUS
  Filled 2017-05-28: qty 10

## 2017-05-28 NOTE — Assessment & Plan Note (Addendum)
#  Metastatic ER/[name] HER-2/neu positive- currently on Taxol Herceptin perjeta; she is currently status post cycle #1 day 1. Tolerated chemotherapy well.   # Proceed with cycle #1 day 8 of weekly Taxol. Discussed with the husband that the plan is to continue chemotherapy-antibody therapy 4-6 cycles- based on response /tolerability; and then continue maintenance perjeta Herceptin. Also discussed the patient and family that the treatment is going to be palliative; indefinite treatments.  # New right breast metastatic invasive carcinoma  [Left breast previously ER/PR-Neg;  HER-2/neu receptor positive]-  discussed with the pathologist Dr.Lane. However this is not going to change our current management.   # Dementia-worsening as per family/chronic debility/ataxiaMRI brain negative.  # [facility] tets[facility] - will order on the left ax metastatic lesion.   # treatment today; labs/taxol next week; 2 weeks/MD/treatment-taxol-hercep-perjeta

## 2017-05-28 NOTE — Progress Notes (Signed)
[place] OFFICE PROGRESS NOTE  Patient Care Team: [name], [name], MD (General Surgery) Requested, Self  Breast cancer metastasized to axillary lymph node [facility])   Staging form: Breast, [name], [name], [name], MD on 01/29/2015    Oncology History   # June 2016- LEFT BREAST CA;  [name] ; [name] ; [name]; Her 2 Neu POS; [place] from July OF 2016; [name]; adjuvant Herceptin [ Finished July 2017]; AUG 2017- Neratinib x5 days; DISCON sec to diarrhea  # MID OCT 2018- Right breast mass-Bx- ER/PR-NEG; Her 2 NEU POSITIVE 1~2.5cm;  [?NEW primary]  # MID-OCT 2018-METASTATIC RECURRENT-oh sternal mass; Left Ax LN [Bx]/periportal LN  # OCT 25th 2018- TAXOL-HERCEPTIN-PERJETA   -------------------------------------------------------------------------------   # MUGA scan- July 28th 2017- 67%.  # chronic gait/balance issues  # June 2017- left breast Bx- fat necrosis [[name]  # july 2017-  BRCA 1& 2- NEG.      Carcinoma of overlapping sites of left breast in female, estrogen receptor negative ([place])    INTERVAL HISTORY:  [name] 76 y.o.  female pleasant patient above history of  recurrent/metastatic breast cancer ER/[name] HER-2/neu positive currently status post cycle #1 of Taxol Herceptin perjeta day #1 approximately 1 week ago is here for follow-up.  Patient denies any diarrhea. Denies any constipation. No skin rash. No fever chills. No tingling, numbness. No significant hair loss.   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 She walks with a cane. Denies any unusual shortness of breath or cough. Denies any swelling in the legs.   REVIEW OF SYSTEMS:  A complete 10 point review of system is done which is  negative except mentioned above/history of present illness.   PAST MEDICAL HISTORY :  Past Medical History:  Diagnosis Date  . Arthritis   . 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 Breast cancer ([place]) 2016  . Breast cancer of upper-inner quadrant of left female breast ([place]) 12/15/14   Completed radiation end of December and finished chemotherapy 2 weeks ago, Left breast invasive mammary carcinoma, T1cN60mc (1.5 cm); Grade 3, [name], [name], [name], .  .Marland KitchenCataract    bilat   . 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 Hearing loss   . History of cancer chemotherapy   . History of radiation therapy   . Imbalance   . Memory impairment    seen by Dr SManuella Ghazi possible post crainiotomy from radiation  . Numbness and tingling    right hand   . Shoulder pain, right   . Wears glasses     PAST SURGICAL HISTORY :   Past Surgical History:  Procedure Laterality Date  . APPENDECTOMY  1950  . BRAIN SURGERY  1995   left frontal/temporal  . BREAST BIOPSY Left 1997  . BREAST BIOPSY Left 12/15/14   confirmed DCIS  . COLONOSCOPY  2010   Dr. ETiffany Kocher   FAMILY HISTORY :   Family History  Problem Relation Age of Onset  . Breast cancer Sister 461 . Addison's disease Mother   . Stroke Father     SOCIAL HISTORY:   Social History  Tobacco Use  . Smoking status: Former Smoker    Packs/day: 0.25    Years: 5.00    Pack years: 1.25    Types: Cigarettes    Last attempt to quit: 07/28/1972    Years since quitting: 44.8  . Smokeless tobacco: Never Used  Substance Use Topics  . Alcohol use: Yes    Alcohol/week: 0.6 - 1.2 oz    Types: 1 - 2 Glasses of wine per week    Comment: 1 Glass Wine / Night  . Drug use: No    ALLERGIES:  is allergic to donepezil.  MEDICATIONS:  Current Outpatient Medications  Medication Sig Dispense Refill  . Ascorbic Acid (VITAMIN C) 100 MG tablet Take 100 mg by mouth every morning.     . Calcium Carbonate-Vit D-Min  (CALTRATE 600+D PLUS MINERALS) 600-800 MG-UNIT TABS Take 1 tablet by mouth every morning.    . ETODOLAC PO Take 1 tablet by mouth daily.    . ferrous sulfate 325 (65 FE) MG tablet Take 1 tablet (325 mg total) by mouth 3 (three) times daily after meals.  3  . lidocaine-prilocaine (EMLA) cream Apply 1 application topically as needed. 30 g 3  . loratadine (CLARITIN) 10 MG tablet Take 10 mg by mouth daily as needed.     . ondansetron (ZOFRAN) 8 MG tablet Take 1 tablet (8 mg total) by mouth every 8 (eight) hours as needed for nausea or vomiting (start 3 days; after chemo). 40 tablet 1  . polyvinyl alcohol (LIQUIFILM TEARS) 1.4 % ophthalmic solution Place 1 drop into both eyes as needed for dry eyes.     Marland Kitchen prochlorperazine (COMPAZINE) 10 MG tablet Take 1 tablet (10 mg total) by mouth every 6 (six) hours as needed for nausea or vomiting. 40 tablet 1  . traMADol (ULTRAM) 50 MG tablet Take 50 mg by mouth as needed.     . vitamin E 400 UNIT capsule Take 400 Units by mouth daily.      No current facility-administered medications for this visit.     PHYSICAL EXAMINATION: ECOG PERFORMANCE STATUS: 1 - Symptomatic but completely ambulatory  BP 105/73 (BP Location: Left Arm)   Pulse (!) 156   Resp 16   Wt 166 lb 6.4 oz (75.5 kg)   BMI 31.44 kg/m   Filed Weights   05/28/17 0847  Weight: 166 lb 6.4 oz (75.5 kg)    GENERAL: Well-nourished well-developed; Alert, no distress and comfortable.  With her husband.  Walks with a cane. EYES: no pallor or icterus OROPHARYNX: no thrush or ulceration; good dentition  NECK: supple, no masses felt LYMPH:  palpable lymphadenopathy Left supraclavicular region ; left cervical region. Adenopathy felt in the left axillary.  LUNGS: clear to auscultation and  No wheeze or crackles HEART/CVS: regular rate & rhythm and no murmurs; No lower extremity edema ABDOMEN:abdomen soft, non-tender and normal bowel sounds Musculoskeletal:no cyanosis of digits and no clubbing; 3-4  cm raised lesion noted in the sternal area.  PSYCH: alert & oriented x 3 with fluent speech NEURO: no focal motor/sensory deficits SKIN:  no rashes or significant lesions  LABORATORY DATA:  I have reviewed the data as listed    Component Value Date/Time   NA 136 05/28/2017 0836   K 3.9 05/28/2017 0836   CL 100 (L) 05/28/2017 0836   CO2 28 05/28/2017 0836   GLUCOSE 128 (H) 05/28/2017 0836   BUN 15 05/28/2017 0836   CREATININE 0.75 05/28/2017 0836   CALCIUM 8.9  05/28/2017 0836   PROT 6.8 05/28/2017 0836   ALBUMIN 4.1 05/28/2017 0836   AST 35 05/28/2017 0836   ALT 42 05/28/2017 0836   ALKPHOS 56 05/28/2017 0836   BILITOT 0.6 05/28/2017 0836   GFRNONAA >60 05/28/2017 0836   GFRAA >60 05/28/2017 0836    No results found for: SPEP, UPEP  Lab Results  Component Value Date   WBC 4.6 05/28/2017   NEUTROABS 3.5 05/28/2017   HGB 13.1 05/28/2017   HCT 38.7 05/28/2017   MCV 91.9 05/28/2017   PLT 342 05/28/2017      Chemistry      Component Value Date/Time   NA 136 05/28/2017 0836   K 3.9 05/28/2017 0836   CL 100 (L) 05/28/2017 0836   CO2 28 05/28/2017 0836   BUN 15 05/28/2017 0836   CREATININE 0.75 05/28/2017 0836      Component Value Date/Time   CALCIUM 8.9 05/28/2017 0836   ALKPHOS 56 05/28/2017 0836   AST 35 05/28/2017 0836   ALT 42 05/28/2017 0836   BILITOT 0.6 05/28/2017 0836     No  focal wall motion abnormality of the left ventricle. Calculated left ventricular ejection fraction equals 67%. Previously 61% on 11/13/2015 IMPRESSION: Left ventricular ejection fraction equals 67 %. Electronically Signed   By: Suzy Bouchard M.D.   On: 02/20/2016 15:02  RADIOGRAPHIC STUDIES: I have personally reviewed the radiological images as listed and agreed with the findings in the report. No results found.  IMPRESSION: 1. Hypermetabolic recurrent/metastatic disease within the neck, chest, abdomen as detailed above. 2.  Aortic Atherosclerosis (ICD10-I70.0). 3.  Hepatic steatosis. 4. Uterine fibroids. 5. 3.8 cm simple appearing right adnexal/ovarian lesion should be considered for nonemergent pelvic ultrasound.   Electronically Signed   By: Abigail Miyamoto M.D.   On: 05/13/2017 09:46 ASSESSMENT & PLAN:  Carcinoma of overlapping sites of left breast in female, estrogen receptor negative ([place]) # Metastatic ER/[name] HER-2/neu positive- currently on Taxol Herceptin perjeta; she is currently status post cycle #1 day 1. Tolerated chemotherapy well.   # Proceed with cycle #1 day 8 of weekly Taxol. Discussed with the husband that the plan is to continue chemotherapy-antibody therapy 4-6 cycles- based on response /tolerability; and then continue maintenance perjeta Herceptin. Also discussed the patient and family that the treatment is going to be palliative; indefinite treatments.  # New right breast metastatic invasive carcinoma  [Left breast previously ER/PR-Neg;  HER-2/neu receptor positive]-  discussed with the pathologist Dr.Lane. However this is not going to change our current management.   # Dementia-worsening as per family/chronic debility/ataxiaMRI brain negative.  # [facility] - will order on the left ax metastatic lesion.   # treatment today; labs/taxol next week; 2 weeks/MD/treatment-taxol-hercep-perjeta     Orders Placed This Encounter  Procedures  . CBC with Differential/Platelet    Standing Status:   Standing    Number of Occurrences:   10    Standing Expiration Date:   05/28/2018  . Comprehensive metabolic panel    Standing Status:   Standing    Number of Occurrences:   10    Standing Expiration Date:   05/28/2018   All questions were answered. The patient knows to call the clinic with any problems, questions or concerns.      Cammie Sickle, MD 05/31/2017 5:37 PM

## 2017-06-02 ENCOUNTER — Encounter: Payer: Self-pay | Admitting: General Surgery

## 2017-06-02 ENCOUNTER — Ambulatory Visit (INDEPENDENT_AMBULATORY_CARE_PROVIDER_SITE_OTHER): Payer: Medicare Other | Admitting: General Surgery

## 2017-06-02 VITALS — BP 132/70 | HR 83 | Resp 14 | Ht 61.0 in | Wt 164.0 lb

## 2017-06-02 DIAGNOSIS — C50912 Malignant neoplasm of unspecified site of left female breast: Secondary | ICD-10-CM

## 2017-06-02 DIAGNOSIS — C773 Secondary and unspecified malignant neoplasm of axilla and upper limb lymph nodes: Secondary | ICD-10-CM

## 2017-06-02 NOTE — Patient Instructions (Addendum)
The patient is aware to use a heating pad as needed for comfort left axilla.

## 2017-06-02 NOTE — Progress Notes (Signed)
Patient ID: [name], female   DOB: Sep 26, 1940, 76 y.o.   MRN: [id number]  Chief Complaint  Patient presents with  . Follow-up    HPI [name] is a 76 y.o. female.  Here today for follow up. No new breast complaints. She states she had She has had 2 treatments with Dr Rogue Bussing, 5 cycles planned.  She is her with her husband Dr Tobe Sos.  HPI  Past Medical History:  Diagnosis Date  . Arthritis   . 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 Breast cancer ([place]) 2016  . Breast cancer of upper-inner quadrant of left female breast ([place]) 12/15/14   Completed radiation end of December and finished chemotherapy 2 weeks ago, Left breast invasive mammary carcinoma, T1cN41mc (1.5 cm); Grade 3, [name], [name], [name], .  .Marland KitchenCataract    bilat   . 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 Hearing loss   . History of cancer chemotherapy   . History of radiation therapy   . Imbalance   . Memory impairment    seen by Dr SManuella Ghazi possible post crainiotomy from radiation  . Numbness and tingling    right hand   . Shoulder pain, right   . Wears glasses     Past Surgical History:  Procedure Laterality Date  . APPENDECTOMY  1950  . BRAIN SURGERY  1995   left frontal/temporal  . BREAST BIOPSY Left 1997  . BREAST BIOPSY Left 12/15/14   confirmed DCIS  . COLONOSCOPY  2010   Dr. ETiffany Kocher   Family History  Problem Relation Age of Onset  . Breast cancer Sister 454 . Addison's disease Mother   . Stroke Father     Social History Social History   Tobacco Use  . Smoking status: Former Smoker    Packs/day: 0.25    Years: 5.00    Pack years: 1.25    Types: Cigarettes    Last attempt to quit: 07/28/1972    Years since quitting: 44.8  . Smokeless tobacco: Never Used  Substance Use Topics  . Alcohol use: Yes    Alcohol/week: 0.6 - 1.2 oz    Types: 1 - 2 Glasses of wine per week    Comment: 1 Glass Wine / Night  . Drug use: No     Allergies  Allergen Reactions  . Donepezil     Other reaction(s): Other (See Comments) Nightmares    Current Outpatient Medications  Medication Sig Dispense Refill  . Ascorbic Acid (VITAMIN C) 100 MG tablet Take 100 mg by mouth every morning.     . Calcium Carbonate-Vit D-Min (CALTRATE 600+D PLUS MINERALS) 600-800 MG-UNIT TABS Take 1 tablet by mouth every morning.    . ETODOLAC PO Take 1 tablet by mouth daily.    . ferrous sulfate 325 (65 FE) MG tablet Take 1 tablet (325 mg total) by mouth 3 (three) times daily after meals.  3  . lidocaine-prilocaine (EMLA) cream Apply 1 application topically as needed. 30 g 3  . loratadine (CLARITIN) 10 MG tablet Take 10 mg by mouth daily as needed.     . ondansetron (ZOFRAN) 8 MG tablet Take 1 tablet (8 mg total) by mouth every 8 (eight) hours as needed for nausea or vomiting (start 3 days; after chemo). 40 tablet 1  . polyvinyl alcohol (LIQUIFILM TEARS) 1.4 % ophthalmic solution Place 1 drop into both  eyes as needed for dry eyes.     Marland Kitchen prochlorperazine (COMPAZINE) 10 MG tablet Take 1 tablet (10 mg total) by mouth every 6 (six) hours as needed for nausea or vomiting. 40 tablet 1  . traMADol (ULTRAM) 50 MG tablet Take 50 mg by mouth as needed.     . vitamin E 400 UNIT capsule Take 400 Units by mouth daily.      No current facility-administered medications for this visit.     Review of Systems Review of Systems  Constitutional: Negative.   Respiratory: Negative.   Cardiovascular: Negative.     Blood pressure 132/70, pulse 83, resp. rate 14, height 5' 1"  (1.549 m), weight 164 lb (74.4 kg).  Physical Exam Physical Exam  Constitutional: She is oriented to person, place, and time. She appears well-developed and well-nourished.  Neurological: She is alert and oriented to person, place, and time.  Skin: Skin is warm and dry.  Psychiatric: Her behavior is normal.  Right breast biopsy site well healed.  Data Reviewed 05/13/2017 PET  scan: IMPRESSION: 1. Hypermetabolic recurrent/metastatic disease within the neck, chest, abdomen as detailed above. 2.  Aortic Atherosclerosis (ICD10-I70.0). 3. Hepatic steatosis. 4. Uterine fibroids. 5. 3.8 cm simple appearing right adnexal/ovarian lesion should be considered for nonemergent pelvic ultrasound.  05/18/2017 and brain MRI: IMPRESSION: 1. No evidence of intracranial metastatic disease. 2. Stable postsurgical changes in the left middle cranial fossa. No definite evidence of recurrent meningioma. 3. Unchanged chronic small vessel ischemic disease.    Assessment    Widely  metastatic breast cancer.    Plan        The patient is aware to use a heating pad as needed for comfort left axilla. Follow up as needed.  HPI, Physical Exam, Assessment and Plan have been scribed under the direction and in the presence of [name], MD. Karie Fetch, RN  I have completed the exam and reviewed the above documentation for accuracy and completeness.  I agree with the above.  Haematologist has been used and any errors in dictation or transcription are unintentional.  Hervey Ard, M.D., F.A.C.S.  [name] 06/03/2017, 6:57 PM

## 2017-06-03 DIAGNOSIS — C773 Secondary and unspecified malignant neoplasm of axilla and upper limb lymph nodes: Principal | ICD-10-CM

## 2017-06-03 DIAGNOSIS — C50912 Malignant neoplasm of unspecified site of left female breast: Secondary | ICD-10-CM | POA: Insufficient documentation

## 2017-06-04 ENCOUNTER — Other Ambulatory Visit: Payer: Self-pay | Admitting: *Deleted

## 2017-06-04 ENCOUNTER — Inpatient Hospital Stay: Payer: Medicare Other

## 2017-06-04 VITALS — BP 123/76 | HR 82 | Temp 97.9°F | Resp 16 | Wt 163.6 lb

## 2017-06-04 DIAGNOSIS — R2681 Unsteadiness on feet: Secondary | ICD-10-CM

## 2017-06-04 DIAGNOSIS — Z171 Estrogen receptor negative status [ER-]: Principal | ICD-10-CM

## 2017-06-04 DIAGNOSIS — Z5111 Encounter for antineoplastic chemotherapy: Secondary | ICD-10-CM | POA: Diagnosis not present

## 2017-06-04 DIAGNOSIS — C50812 Malignant neoplasm of overlapping sites of left female breast: Secondary | ICD-10-CM

## 2017-06-04 LAB — CBC WITH DIFFERENTIAL/PLATELET
Basophils Absolute: 0 10*3/uL (ref 0–0.1)
Basophils Relative: 1 %
EOS PCT: 3 %
Eosinophils Absolute: 0.1 10*3/uL (ref 0–0.7)
HCT: 38.9 % (ref 35.0–47.0)
Hemoglobin: 12.8 g/dL (ref 12.0–16.0)
LYMPHS ABS: 0.5 10*3/uL — AB (ref 1.0–3.6)
LYMPHS PCT: 16 %
MCH: 30.4 pg (ref 26.0–34.0)
MCHC: 32.9 g/dL (ref 32.0–36.0)
MCV: 92.3 fL (ref 80.0–100.0)
MONO ABS: 0.3 10*3/uL (ref 0.2–0.9)
MONOS PCT: 8 %
Neutro Abs: 2.4 10*3/uL (ref 1.4–6.5)
Neutrophils Relative %: 72 %
PLATELETS: 355 10*3/uL (ref 150–440)
RBC: 4.21 MIL/uL (ref 3.80–5.20)
RDW: 14.1 % (ref 11.5–14.5)
WBC: 3.3 10*3/uL — AB (ref 3.6–11.0)

## 2017-06-04 LAB — COMPREHENSIVE METABOLIC PANEL
ALBUMIN: 4 g/dL (ref 3.5–5.0)
ALT: 44 U/L (ref 14–54)
AST: 38 U/L (ref 15–41)
Alkaline Phosphatase: 61 U/L (ref 38–126)
Anion gap: 8 (ref 5–15)
BUN: 15 mg/dL (ref 6–20)
CHLORIDE: 102 mmol/L (ref 101–111)
CO2: 25 mmol/L (ref 22–32)
Calcium: 8.7 mg/dL — ABNORMAL LOW (ref 8.9–10.3)
Creatinine, Ser: 0.76 mg/dL (ref 0.44–1.00)
GFR calc Af Amer: >60 mL/min (ref >60)
GFR calc non Af Amer: >60 mL/min (ref >60)
GLUCOSE: 116 mg/dL — AB (ref 65–99)
POTASSIUM: 3.9 mmol/L (ref 3.5–5.1)
SODIUM: 135 mmol/L (ref 135–145)
Total Bilirubin: 0.5 mg/dL (ref 0.3–1.2)
Total Protein: 6.7 g/dL (ref 6.5–8.1)

## 2017-06-04 MED ORDER — PACLITAXEL CHEMO INJECTION 300 MG/50ML
80.0000 mg/m2 | Freq: Once | INTRAVENOUS | Status: AC
  Administered 2017-06-04: 144 mg via INTRAVENOUS
  Filled 2017-06-04: qty 24

## 2017-06-04 MED ORDER — FAMOTIDINE IN NACL 20-0.9 MG/50ML-% IV SOLN
20.0000 mg | Freq: Once | INTRAVENOUS | Status: AC
  Administered 2017-06-04: 20 mg via INTRAVENOUS
  Filled 2017-06-04: qty 50

## 2017-06-04 MED ORDER — PROCHLORPERAZINE MALEATE 10 MG PO TABS: 10.0000 mg | ORAL_TABLET | Freq: Four times a day (QID) | ORAL | 1 refills | Status: DC | PRN

## 2017-06-04 MED ORDER — SODIUM CHLORIDE 0.9% FLUSH
10.0000 mL | Freq: Once | INTRAVENOUS | Status: AC
  Administered 2017-06-04: 10 mL via INTRAVENOUS
  Filled 2017-06-04: qty 10

## 2017-06-04 MED ORDER — SODIUM CHLORIDE 0.9 % IV SOLN
20.0000 mg | Freq: Once | INTRAVENOUS | Status: AC
  Administered 2017-06-04: 20 mg via INTRAVENOUS
  Filled 2017-06-04: qty 2

## 2017-06-04 MED ORDER — HEPARIN SOD (PORK) LOCK FLUSH 100 UNIT/ML IV SOLN: 500.0000 [IU] | Freq: Once | INTRAVENOUS | Status: DC | PRN

## 2017-06-04 MED ORDER — HEPARIN SOD (PORK) LOCK FLUSH 100 UNIT/ML IV SOLN
500.0000 [IU] | Freq: Once | INTRAVENOUS | Status: AC
  Administered 2017-06-04: 500 [IU] via INTRAVENOUS

## 2017-06-04 MED ORDER — ONDANSETRON HCL 8 MG PO TABS: 8.0000 mg | ORAL_TABLET | Freq: Three times a day (TID) | ORAL | 1 refills | Status: DC | PRN

## 2017-06-04 MED ORDER — DIPHENHYDRAMINE HCL 50 MG/ML IJ SOLN
50.0000 mg | Freq: Once | INTRAMUSCULAR | Status: AC
  Administered 2017-06-04: 50 mg via INTRAVENOUS
  Filled 2017-06-04: qty 1

## 2017-06-04 MED ORDER — SODIUM CHLORIDE 0.9 % IV SOLN
Freq: Once | INTRAVENOUS | Status: AC
  Administered 2017-06-04: 11:00:00 via INTRAVENOUS
  Filled 2017-06-04: qty 1000

## 2017-06-10 ENCOUNTER — Other Ambulatory Visit: Payer: Self-pay

## 2017-06-10 ENCOUNTER — Inpatient Hospital Stay (HOSPITAL_BASED_OUTPATIENT_CLINIC_OR_DEPARTMENT_OTHER): Payer: Medicare Other | Admitting: Internal Medicine

## 2017-06-10 ENCOUNTER — Encounter: Payer: Self-pay | Admitting: Internal Medicine

## 2017-06-10 ENCOUNTER — Inpatient Hospital Stay: Payer: Medicare Other

## 2017-06-10 VITALS — BP 130/70 | HR 84 | Temp 97.4°F | Resp 20 | Ht 61.0 in | Wt 162.0 lb

## 2017-06-10 DIAGNOSIS — Z79899 Other long term (current) drug therapy: Secondary | ICD-10-CM

## 2017-06-10 DIAGNOSIS — C50812 Malignant neoplasm of overlapping sites of left female breast: Secondary | ICD-10-CM

## 2017-06-10 DIAGNOSIS — Z171 Estrogen receptor negative status [ER-]: Secondary | ICD-10-CM

## 2017-06-10 DIAGNOSIS — C50212 Malignant neoplasm of upper-inner quadrant of left female breast: Secondary | ICD-10-CM | POA: Diagnosis not present

## 2017-06-10 DIAGNOSIS — L739 Follicular disorder, unspecified: Secondary | ICD-10-CM | POA: Diagnosis not present

## 2017-06-10 DIAGNOSIS — Z923 Personal history of irradiation: Secondary | ICD-10-CM | POA: Diagnosis not present

## 2017-06-10 DIAGNOSIS — Z87891 Personal history of nicotine dependence: Secondary | ICD-10-CM | POA: Diagnosis not present

## 2017-06-10 DIAGNOSIS — Z5111 Encounter for antineoplastic chemotherapy: Secondary | ICD-10-CM | POA: Diagnosis not present

## 2017-06-10 DIAGNOSIS — C7981 Secondary malignant neoplasm of breast: Secondary | ICD-10-CM | POA: Diagnosis not present

## 2017-06-10 LAB — CBC WITH DIFFERENTIAL/PLATELET
BASOS PCT: 0 %
Basophils Absolute: 0 10*3/uL (ref 0–0.1)
Eosinophils Absolute: 0.1 10*3/uL (ref 0–0.7)
Eosinophils Relative: 2 %
HEMATOCRIT: 37.7 % (ref 35.0–47.0)
HEMOGLOBIN: 12.8 g/dL (ref 12.0–16.0)
LYMPHS ABS: 0.5 10*3/uL — AB (ref 1.0–3.6)
LYMPHS PCT: 11 %
MCH: 31.2 pg (ref 26.0–34.0)
MCHC: 34 g/dL (ref 32.0–36.0)
MCV: 91.8 fL (ref 80.0–100.0)
MONOS PCT: 6 %
Monocytes Absolute: 0.2 10*3/uL (ref 0.2–0.9)
NEUTROS ABS: 3.3 10*3/uL (ref 1.4–6.5)
NEUTROS PCT: 81 %
Platelets: 327 10*3/uL (ref 150–440)
RBC: 4.1 MIL/uL (ref 3.80–5.20)
RDW: 14 % (ref 11.5–14.5)
WBC: 4.1 10*3/uL (ref 3.6–11.0)

## 2017-06-10 LAB — COMPREHENSIVE METABOLIC PANEL
ALBUMIN: 4 g/dL (ref 3.5–5.0)
ALK PHOS: 61 U/L (ref 38–126)
ALT: 35 U/L (ref 14–54)
ANION GAP: 8 (ref 5–15)
AST: 35 U/L (ref 15–41)
BILIRUBIN TOTAL: 0.5 mg/dL (ref 0.3–1.2)
BUN: 13 mg/dL (ref 6–20)
CALCIUM: 9 mg/dL (ref 8.9–10.3)
CO2: 28 mmol/L (ref 22–32)
Chloride: 102 mmol/L (ref 101–111)
Creatinine, Ser: 0.84 mg/dL (ref 0.44–1.00)
GFR calc Af Amer: >60 mL/min (ref >60)
GFR calc non Af Amer: >60 mL/min (ref >60)
GLUCOSE: 125 mg/dL — AB (ref 65–99)
Potassium: 3.8 mmol/L (ref 3.5–5.1)
Sodium: 138 mmol/L (ref 135–145)
Total Protein: 6.5 g/dL (ref 6.5–8.1)

## 2017-06-10 MED ORDER — SODIUM CHLORIDE 0.9 % IV SOLN
Freq: Once | INTRAVENOUS | Status: AC
Start: 2017-06-10 — End: 2017-06-10
  Administered 2017-06-10: 11:00:00 via INTRAVENOUS
  Filled 2017-06-10: qty 1000

## 2017-06-10 MED ORDER — PACLITAXEL CHEMO INJECTION 300 MG/50ML
80.0000 mg/m2 | Freq: Once | INTRAVENOUS | Status: AC
  Administered 2017-06-10: 144 mg via INTRAVENOUS
  Filled 2017-06-10: qty 24

## 2017-06-10 MED ORDER — TRASTUZUMAB CHEMO 150 MG IV SOLR
450.0000 mg | Freq: Once | INTRAVENOUS | Status: AC
  Administered 2017-06-10: 450 mg via INTRAVENOUS
  Filled 2017-06-10: qty 21.43

## 2017-06-10 MED ORDER — SODIUM CHLORIDE 0.9 % IV SOLN
420.0000 mg | Freq: Once | INTRAVENOUS | Status: AC
  Administered 2017-06-10: 420 mg via INTRAVENOUS
  Filled 2017-06-10: qty 14

## 2017-06-10 MED ORDER — HEPARIN SOD (PORK) LOCK FLUSH 100 UNIT/ML IV SOLN
500.0000 [IU] | Freq: Once | INTRAVENOUS | Status: AC | PRN
  Administered 2017-06-10: 500 [IU]
  Filled 2017-06-10: qty 5

## 2017-06-10 MED ORDER — ACETAMINOPHEN 325 MG PO TABS
650.0000 mg | ORAL_TABLET | Freq: Once | ORAL | Status: AC
  Administered 2017-06-10: 650 mg via ORAL
  Filled 2017-06-10: qty 2

## 2017-06-10 MED ORDER — SODIUM CHLORIDE 0.9 % IV SOLN
20.0000 mg | Freq: Once | INTRAVENOUS | Status: AC
  Administered 2017-06-10: 20 mg via INTRAVENOUS
  Filled 2017-06-10: qty 2

## 2017-06-10 MED ORDER — DIPHENHYDRAMINE HCL 50 MG/ML IJ SOLN
25.0000 mg | Freq: Once | INTRAMUSCULAR | Status: AC
  Administered 2017-06-10: 25 mg via INTRAVENOUS
  Filled 2017-06-10: qty 1

## 2017-06-10 MED ORDER — FAMOTIDINE IN NACL 20-0.9 MG/50ML-% IV SOLN
20.0000 mg | Freq: Once | INTRAVENOUS | Status: AC
  Administered 2017-06-10: 20 mg via INTRAVENOUS
  Filled 2017-06-10: qty 50

## 2017-06-10 MED ORDER — CEPHALEXIN 500 MG PO CAPS
500.0000 mg | ORAL_CAPSULE | Freq: Three times a day (TID) | ORAL | 0 refills | Status: DC
Start: 2017-06-10 — End: 2017-07-22

## 2017-06-10 NOTE — Progress Notes (Signed)
[place] OFFICE PROGRESS NOTE  Patient Care Team: [name], [name], MD (General Surgery) Requested, Self  Breast cancer metastasized to axillary lymph node [facility])   Staging form: Breast, [name], [name], [name], MD on 01/29/2015    Oncology History   # June 2016- LEFT BREAST CA;  [name] ; [name] ; [name]; Her 2 Neu POS; [place] from July OF 2016; [name]; adjuvant Herceptin [ Finished July 2017]; AUG 2017- Neratinib x5 days; DISCON sec to diarrhea  # MID OCT 2018- Right breast mass-Bx- ER/PR-NEG; Her 2 NEU POSITIVE 1~2.5cm;  [?NEW primary]  # MID-OCT 2018-METASTATIC RECURRENT-oh sternal mass; Left Ax LN [Bx]/periportal LN  # OCT 25th 2018- TAXOL-HERCEPTIN-PERJETA   -------------------------------------------------------------------------------   # MUGA scan- July 28th 2017- 67%.  # chronic gait/balance issues  # June 2017- left breast Bx- fat necrosis [[name]  # july 2017-  BRCA 1& 2- NEG.   MOLECULAR TESTING- F ONE- TPS- 0%; pending     Carcinoma of overlapping sites of left breast in female, estrogen receptor negative ([place])    INTERVAL HISTORY:  [name] 76 y.o.  female pleasant patient above history of  recurrent/metastatic breast cancer ER/[name] HER-2/neu positive currently status post cycle #1 of Taxol Herceptin perjeta is here for follow-up.  Patient denies any diarrhea. Denies any constipation. No skin rash. No fever chills. No tingling, numbness. No significant hair loss. Noted to have an infection of the finger.  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 She walks with a cane. Denies any unusual shortness of breath or cough. Denies any swelling in the legs.   REVIEW OF SYSTEMS:  A  complete 10 point review of system is done which is negative except mentioned above/history of present illness.   PAST MEDICAL HISTORY :  Past Medical History:  Diagnosis Date  . Arthritis   . 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 Breast cancer ([place]) 2016  . Breast cancer of upper-inner quadrant of left female breast ([place]) 12/15/14   Completed radiation end of December and finished chemotherapy 2 weeks ago, Left breast invasive mammary carcinoma, T1cN58mc (1.5 cm); Grade 3, [name], [name], [name], .  .Marland KitchenCataract    bilat   . 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 Hearing loss   . History of cancer chemotherapy   . History of radiation therapy   . Imbalance   . Memory impairment    seen by Dr SManuella Ghazi possible post crainiotomy from radiation  . Numbness and tingling    right hand   . Shoulder pain, right   . Wears glasses     PAST SURGICAL HISTORY :   Past Surgical History:  Procedure Laterality Date  . APPENDECTOMY  1950  . BRAIN SURGERY  1995   left frontal/temporal  . BREAST BIOPSY Left 1997  . BREAST BIOPSY Left 12/15/14   confirmed DCIS  . BREAST BIOPSY Left 01/08/2015   Procedure: BREAST BIOPSY WITH NEEDLE LOCALIZATION;  Surgeon: [name] MD;  Location: [facility] ORS;  Service: General;  Laterality: Left;  . BREAST LUMPECTOMY Left 01/08/2015   Procedure: LUMPECTOMY;  Surgeon:  [name], MD;  Location: [facility] ORS;  Service: General;  Laterality: Left;  . COLONOSCOPY  2010   Dr. [name]  . PORTACATH PLACEMENT Right 01/16/2015   Procedure: INSERTION PORT-A-CATH;  Surgeon: [name], MD;  Location: [facility] ORS;  Service: General;  Laterality: Right;  . SENTINEL NODE BIOPSY Left 01/16/2015   Procedure: SENTINEL NODE BIOPSY;  Surgeon: [name], MD;  Location: [facility] ORS;  Service: General;  Laterality: Left;  . TOTAL HIP ARTHROPLASTY Right 09/04/2015   Procedure: RIGHT TOTAL HIP ARTHROPLASTY ANTERIOR APPROACH;   Surgeon: [name], MD;  Location: [facility];  Service: Orthopedics;  Laterality: Right;    FAMILY HISTORY :   Family History  Problem Relation Age of Onset  . Breast cancer Sister 76  . Addison's disease Mother   . Stroke Father     SOCIAL HISTORY:   Social History   Tobacco Use  . Smoking status: Former Smoker    Packs/day: 0.25    Years: 5.00    Pack years: 1.25    Types: Cigarettes    Last attempt to quit: 07/28/1972    Years since quitting: 44.9  . Smokeless tobacco: Never Used  Substance Use Topics  . Alcohol use: Yes    Alcohol/week: 0.6 - 1.2 oz    Types: 1 - 2 Glasses of wine per week    Comment: 1 Glass Wine / Night  . Drug use: No    ALLERGIES:  is allergic to donepezil.  MEDICATIONS:  Current Outpatient Medications  Medication Sig Dispense Refill  . Ascorbic Acid (VITAMIN C) 100 MG tablet Take 100 mg by mouth every morning.     . Calcium Carbonate-Vit D-Min (CALTRATE 600+D PLUS MINERALS) 600-800 MG-UNIT TABS Take 1 tablet by mouth every morning.    . ETODOLAC PO Take 1 tablet by mouth daily.    . ferrous sulfate 325 (65 FE) MG tablet Take 1 tablet (325 mg total) by mouth 3 (three) times daily after meals.  3  . lidocaine-prilocaine (EMLA) cream Apply 1 application topically as needed. 30 g 3  . loratadine (CLARITIN) 10 MG tablet Take 10 mg by mouth daily as needed.     . naproxen sodium (ALEVE) 220 MG tablet Take 220 mg daily as needed by mouth (pain).    . polyvinyl alcohol (LIQUIFILM TEARS) 1.4 % ophthalmic solution Place 1 drop into both eyes as needed for dry eyes.     . vitamin E 400 UNIT capsule Take 400 Units by mouth daily.     . cephALEXin (KEFLEX) 500 MG capsule Take 1 capsule (500 mg total) 3 (three) times daily by mouth. 21 capsule 0  . ondansetron (ZOFRAN) 8 MG tablet Take 1 tablet (8 mg total) every 8 (eight) hours as needed by mouth for nausea or vomiting (start 3 days; after chemo). (Patient not taking: Reported on 06/10/2017) 40 tablet 1  .  prochlorperazine (COMPAZINE) 10 MG tablet Take 1 tablet (10 mg total) every 6 (six) hours as needed by mouth for nausea or vomiting. (Patient not taking: Reported on 06/10/2017) 40 tablet 1  . traMADol (ULTRAM) 50 MG tablet Take 50 mg by mouth as needed.      No current facility-administered medications for this visit.     PHYSICAL EXAMINATION: ECOG PERFORMANCE STATUS: 1 - Symptomatic but completely ambulatory  BP 130/70 (BP Location: Left Arm, Patient Position: Sitting)   Pulse 84   Temp (!) 97.4 F (36.3 C) (Tympanic)   Resp 20  Ht _0  (1.549 m)   Wt 162 lb (73.5 kg)   BMI 30.61 kg/m   Filed Weights   06/10/17 1045  Weight: 162 lb (73.5 kg)    GENERAL: Well-nourished well-developed; Alert, no distress and comfortable.  With her husband.  Walks with a cane. EYES: no pallor or icterus OROPHARYNX: no thrush or ulceration; good dentition  NECK: supple, no masses felt LYMPH:  palpable lymphadenopathy Left supraclavicular region ; left cervical region. Adenopathy felt in the left axillary.  LUNGS: clear to auscultation and  No wheeze or crackles HEART/CVS: regular rate & rhythm and no murmurs; No lower extremity edema ABDOMEN:abdomen soft, non-tender and normal bowel sounds Musculoskeletal:no cyanosis of digits and no clubbing; 3-4 cm raised lesion noted in the sternal area.  PSYCH: alert & oriented x 3 with fluent speech NEURO: no focal motor/sensory deficits SKIN:  no rashes or significant lesions; folliculitis noted left hand.  LABORATORY DATA:  I have reviewed the data as listed    Component Value Date/Time   NA 138 06/17/2017 0856   K 3.7 06/17/2017 0856   CL 102 06/17/2017 0856   CO2 27 06/17/2017 0856   GLUCOSE 126 (H) 06/17/2017 0856   BUN 13 06/17/2017 0856   CREATININE 0.76 06/17/2017 0856   CALCIUM 8.5 (L) 06/17/2017 0856   PROT 6.3 (L) 06/17/2017 0856   ALBUMIN 3.9 06/17/2017 0856   AST 32 06/17/2017 0856   ALT 32 06/17/2017 0856   ALKPHOS 58 06/17/2017  0856   BILITOT 0.6 06/17/2017 0856   GFRNONAA >60 06/17/2017 0856   GFRAA >60 06/17/2017 0856    No results found for: SPEP, UPEP  Lab Results  Component Value Date   WBC 7.1 06/17/2017   NEUTROABS 6.1 06/17/2017   HGB 12.4 06/17/2017   HCT 36.7 06/17/2017   MCV 92.3 06/17/2017   PLT 331 06/17/2017      Chemistry      Component Value Date/Time   NA 138 06/17/2017 0856   K 3.7 06/17/2017 0856   CL 102 06/17/2017 0856   CO2 27 06/17/2017 0856   BUN 13 06/17/2017 0856   CREATININE 0.76 06/17/2017 0856      Component Value Date/Time   CALCIUM 8.5 (L) 06/17/2017 0856   ALKPHOS 58 06/17/2017 0856   AST 32 06/17/2017 0856   ALT 32 06/17/2017 0856   BILITOT 0.6 06/17/2017 0856     No  focal wall motion abnormality of the left ventricle. Calculated left ventricular ejection fraction equals 67%. Previously 61% on 11/13/2015 IMPRESSION: Left ventricular ejection fraction equals 67 %. Electronically Signed   By: Suzy Bouchard M.D.   On: 02/20/2016 15:02  RADIOGRAPHIC STUDIES: I have personally reviewed the radiological images as listed and agreed with the findings in the report. Dg Chest 2 View  Result Date: 06/22/2017 CLINICAL DATA:  Cough, runny nose, breast cancer EXAM: CHEST  2 VIEW COMPARISON:  CT chest 05/07/2017 FINDINGS: There is mild elevation of the left diaphragm. There is no focal parenchymal opacity. There is no pleural effusion or pneumothorax. The heart and mediastinal contours are unremarkable. There is a right-sided Port-A-Cath in satisfactory position. The osseous structures are unremarkable. IMPRESSION: No active cardiopulmonary disease. Electronically Signed   By: Kathreen Devoid   On: 06/22/2017 11:44    IMPRESSION: 1. Hypermetabolic recurrent/metastatic disease within the neck, chest, abdomen as detailed above. 2.  Aortic Atherosclerosis (ICD10-I70.0). 3. Hepatic steatosis. 4. Uterine fibroids. 5. 3.8 cm simple appearing right adnexal/ovarian lesion  should  be considered for nonemergent pelvic ultrasound.   Electronically Signed   By: Abigail Miyamoto M.D.   On: 05/13/2017 09:46 ASSESSMENT & PLAN:  Carcinoma of overlapping sites of left breast in female, estrogen receptor negative ([place]) # Metastatic ER/[name] HER-2/neu positive- currently on Taxol Herceptin perjeta; she is currently status post cycle #1. Tolerated chemotherapy well.   # Proceed with cycle #2 day1 of weekly Taxol. Discussed with the husband that the plan is to continue chemotherapy-antibody therapy 4-6 cycles- based on response /tolerability; and then continue maintenance perjeta Herceptin. Also discussed the patient and family that the treatment is going to be palliative; indefinite treatments.  # Dementia-worsening as per family/chronic debility/ataxiaMRI brain negative.  # left hands folliculitis-  kelfex-prescription given.  # Foundation One tetsing -pending.   # Weekly chemotherapy; follow-up in 2 weeks chemotherapy/labs.  No orders of the defined types were placed in this encounter.  All questions were answered. The patient knows to call the clinic with any problems, questions or concerns.      Cammie Sickle, MD 06/23/2017 8:24 PM

## 2017-06-10 NOTE — Assessment & Plan Note (Addendum)
#  Metastatic ER/[name] HER-2/neu positive- currently on Taxol Herceptin perjeta; she is currently status post cycle #1. Tolerated chemotherapy well.   # Proceed with cycle #2 day1 of weekly Taxol. Discussed with the husband that the plan is to continue chemotherapy-antibody therapy 4-6 cycles- based on response /tolerability; and then continue maintenance perjeta Herceptin. Also discussed the patient and family that the treatment is going to be palliative; indefinite treatments.  # Dementia-worsening as per family/chronic debility/ataxiaMRI brain negative.  # left hands folliculitis-  kelfex-prescription given.  # Foundation One tetsing -pending.   # Weekly chemotherapy; follow-up in 2 weeks chemotherapy/labs.

## 2017-06-11 ENCOUNTER — Other Ambulatory Visit: Payer: Medicare Other

## 2017-06-11 ENCOUNTER — Ambulatory Visit: Payer: Medicare Other | Admitting: Internal Medicine

## 2017-06-11 ENCOUNTER — Ambulatory Visit: Payer: Medicare Other

## 2017-06-17 ENCOUNTER — Inpatient Hospital Stay: Payer: Medicare Other

## 2017-06-17 VITALS — BP 121/71 | HR 79 | Temp 97.9°F | Resp 18

## 2017-06-17 DIAGNOSIS — Z5111 Encounter for antineoplastic chemotherapy: Secondary | ICD-10-CM | POA: Diagnosis not present

## 2017-06-17 DIAGNOSIS — Z171 Estrogen receptor negative status [ER-]: Principal | ICD-10-CM

## 2017-06-17 DIAGNOSIS — C50812 Malignant neoplasm of overlapping sites of left female breast: Secondary | ICD-10-CM

## 2017-06-17 LAB — CBC WITH DIFFERENTIAL/PLATELET
BASOS PCT: 0 %
Basophils Absolute: 0 10*3/uL (ref 0–0.1)
Eosinophils Absolute: 0.1 10*3/uL (ref 0–0.7)
Eosinophils Relative: 2 %
HEMATOCRIT: 36.7 % (ref 35.0–47.0)
HEMOGLOBIN: 12.4 g/dL (ref 12.0–16.0)
LYMPHS ABS: 0.5 10*3/uL — AB (ref 1.0–3.6)
Lymphocytes Relative: 7 %
MCH: 31.2 pg (ref 26.0–34.0)
MCHC: 33.8 g/dL (ref 32.0–36.0)
MCV: 92.3 fL (ref 80.0–100.0)
MONOS PCT: 6 %
Monocytes Absolute: 0.4 10*3/uL (ref 0.2–0.9)
NEUTROS ABS: 6.1 10*3/uL (ref 1.4–6.5)
NEUTROS PCT: 85 %
Platelets: 331 10*3/uL (ref 150–440)
RBC: 3.97 MIL/uL (ref 3.80–5.20)
RDW: 14.6 % — ABNORMAL HIGH (ref 11.5–14.5)
WBC: 7.1 10*3/uL (ref 3.6–11.0)

## 2017-06-17 LAB — COMPREHENSIVE METABOLIC PANEL
ALT: 32 U/L (ref 14–54)
AST: 32 U/L (ref 15–41)
Albumin: 3.9 g/dL (ref 3.5–5.0)
Alkaline Phosphatase: 58 U/L (ref 38–126)
Anion gap: 9 (ref 5–15)
BILIRUBIN TOTAL: 0.6 mg/dL (ref 0.3–1.2)
BUN: 13 mg/dL (ref 6–20)
CHLORIDE: 102 mmol/L (ref 101–111)
CO2: 27 mmol/L (ref 22–32)
CREATININE: 0.76 mg/dL (ref 0.44–1.00)
Calcium: 8.5 mg/dL — ABNORMAL LOW (ref 8.9–10.3)
Glucose, Bld: 126 mg/dL — ABNORMAL HIGH (ref 65–99)
Potassium: 3.7 mmol/L (ref 3.5–5.1)
Sodium: 138 mmol/L (ref 135–145)
TOTAL PROTEIN: 6.3 g/dL — AB (ref 6.5–8.1)

## 2017-06-17 MED ORDER — SODIUM CHLORIDE 0.9 % IV SOLN
20.0000 mg | Freq: Once | INTRAVENOUS | Status: AC
  Administered 2017-06-17: 20 mg via INTRAVENOUS
  Filled 2017-06-17: qty 2

## 2017-06-17 MED ORDER — PACLITAXEL CHEMO INJECTION 300 MG/50ML
80.0000 mg/m2 | Freq: Once | INTRAVENOUS | Status: AC
  Administered 2017-06-17: 144 mg via INTRAVENOUS
  Filled 2017-06-17: qty 24

## 2017-06-17 MED ORDER — SODIUM CHLORIDE 0.9 % IV SOLN
Freq: Once | INTRAVENOUS | Status: AC
  Administered 2017-06-17: 10:00:00 via INTRAVENOUS
  Filled 2017-06-17: qty 1000

## 2017-06-17 MED ORDER — DIPHENHYDRAMINE HCL 50 MG/ML IJ SOLN
25.0000 mg | Freq: Once | INTRAMUSCULAR | Status: AC
  Administered 2017-06-17: 25 mg via INTRAVENOUS
  Filled 2017-06-17: qty 1

## 2017-06-17 MED ORDER — HEPARIN SOD (PORK) LOCK FLUSH 100 UNIT/ML IV SOLN: 500.0000 [IU] | Freq: Once | INTRAVENOUS | Status: DC | PRN

## 2017-06-17 MED ORDER — SODIUM CHLORIDE 0.9% FLUSH
10.0000 mL | INTRAVENOUS | Status: DC | PRN
  Administered 2017-06-17: 10 mL via INTRAVENOUS
  Filled 2017-06-17: qty 10

## 2017-06-17 MED ORDER — FAMOTIDINE IN NACL 20-0.9 MG/50ML-% IV SOLN
20.0000 mg | Freq: Once | INTRAVENOUS | Status: AC
  Administered 2017-06-17: 20 mg via INTRAVENOUS
  Filled 2017-06-17: qty 50

## 2017-06-17 MED ORDER — HEPARIN SOD (PORK) LOCK FLUSH 100 UNIT/ML IV SOLN
500.0000 [IU] | Freq: Once | INTRAVENOUS | Status: AC
  Administered 2017-06-17: 500 [IU] via INTRAVENOUS
  Filled 2017-06-17: qty 5

## 2017-06-22 ENCOUNTER — Other Ambulatory Visit: Payer: Self-pay | Admitting: *Deleted

## 2017-06-22 ENCOUNTER — Telehealth: Payer: Self-pay | Admitting: Internal Medicine

## 2017-06-22 ENCOUNTER — Ambulatory Visit
Admission: RE | Admit: 2017-06-22 | Discharge: 2017-06-22 | Disposition: A | Discharge status: Home / Self Care | Payer: Medicare Other | Source: Ambulatory Visit | Attending: Internal Medicine | Admitting: Internal Medicine

## 2017-06-22 DIAGNOSIS — R05 Cough: Secondary | ICD-10-CM

## 2017-06-22 DIAGNOSIS — R059 Cough, unspecified: Secondary | ICD-10-CM

## 2017-06-22 NOTE — Progress Notes (Signed)
Patient's husband called and is concerned about the patients worsening cough.  States she has had a non-productive cough for 3 weeks.  Denies any fever.  Cough gets worse at night.  She has been using Claritin, and propping her head up at night.  Husband states the cough has gotten worse and is keeping her up at night.  Discussed with Dr. Rogue Bussing.  Orders for a chest x-ray today.  Husband informed of plan.  He is happy with getting an x-ray.  They are to call with any questions or needs.

## 2017-06-22 NOTE — Telephone Encounter (Signed)
Dry cough- esp at night; not improved on claritin/ppi. cxr-no acute process.  Recommend robitussin qhs for now.

## 2017-06-22 NOTE — Telephone Encounter (Signed)
Left message on cell  Home phone to call us back re: CXR results.

## 2017-06-24 ENCOUNTER — Inpatient Hospital Stay (HOSPITAL_BASED_OUTPATIENT_CLINIC_OR_DEPARTMENT_OTHER): Payer: Medicare Other | Admitting: Internal Medicine

## 2017-06-24 ENCOUNTER — Encounter: Payer: Self-pay | Admitting: Internal Medicine

## 2017-06-24 ENCOUNTER — Inpatient Hospital Stay: Payer: Medicare Other

## 2017-06-24 VITALS — BP 125/74 | HR 103 | Temp 98.1°F | Resp 16

## 2017-06-24 DIAGNOSIS — R05 Cough: Secondary | ICD-10-CM | POA: Diagnosis not present

## 2017-06-24 DIAGNOSIS — C7981 Secondary malignant neoplasm of breast: Secondary | ICD-10-CM | POA: Diagnosis not present

## 2017-06-24 DIAGNOSIS — C50812 Malignant neoplasm of overlapping sites of left female breast: Secondary | ICD-10-CM

## 2017-06-24 DIAGNOSIS — Z171 Estrogen receptor negative status [ER-]: Principal | ICD-10-CM

## 2017-06-24 DIAGNOSIS — R197 Diarrhea, unspecified: Secondary | ICD-10-CM | POA: Diagnosis not present

## 2017-06-24 DIAGNOSIS — Z87891 Personal history of nicotine dependence: Secondary | ICD-10-CM | POA: Diagnosis not present

## 2017-06-24 DIAGNOSIS — R040 Epistaxis: Secondary | ICD-10-CM

## 2017-06-24 DIAGNOSIS — F0390 Unspecified dementia without behavioral disturbance: Secondary | ICD-10-CM

## 2017-06-24 DIAGNOSIS — L739 Follicular disorder, unspecified: Secondary | ICD-10-CM | POA: Diagnosis not present

## 2017-06-24 DIAGNOSIS — Z5111 Encounter for antineoplastic chemotherapy: Secondary | ICD-10-CM | POA: Diagnosis not present

## 2017-06-24 DIAGNOSIS — R0982 Postnasal drip: Secondary | ICD-10-CM | POA: Diagnosis not present

## 2017-06-24 DIAGNOSIS — C50212 Malignant neoplasm of upper-inner quadrant of left female breast: Secondary | ICD-10-CM | POA: Diagnosis not present

## 2017-06-24 DIAGNOSIS — Z79899 Other long term (current) drug therapy: Secondary | ICD-10-CM | POA: Diagnosis not present

## 2017-06-24 DIAGNOSIS — Z923 Personal history of irradiation: Secondary | ICD-10-CM | POA: Diagnosis not present

## 2017-06-24 LAB — CBC WITH DIFFERENTIAL/PLATELET
BASOS ABS: 0 10*3/uL (ref 0–0.1)
BASOS PCT: 0 %
EOS PCT: 2 %
Eosinophils Absolute: 0.1 10*3/uL (ref 0–0.7)
HEMATOCRIT: 37.2 % (ref 35.0–47.0)
Hemoglobin: 12.5 g/dL (ref 12.0–16.0)
LYMPHS PCT: 11 %
Lymphs Abs: 0.7 10*3/uL — ABNORMAL LOW (ref 1.0–3.6)
MCH: 30.8 pg (ref 26.0–34.0)
MCHC: 33.5 g/dL (ref 32.0–36.0)
MCV: 92 fL (ref 80.0–100.0)
MONO ABS: 0.6 10*3/uL (ref 0.2–0.9)
Monocytes Relative: 10 %
NEUTROS ABS: 4.6 10*3/uL (ref 1.4–6.5)
Neutrophils Relative %: 77 %
PLATELETS: 425 10*3/uL (ref 150–440)
RBC: 4.05 MIL/uL (ref 3.80–5.20)
RDW: 14.5 % (ref 11.5–14.5)
WBC: 5.9 10*3/uL (ref 3.6–11.0)

## 2017-06-24 LAB — COMPREHENSIVE METABOLIC PANEL
ALK PHOS: 54 U/L (ref 38–126)
ALT: 22 U/L (ref 14–54)
ANION GAP: 8 (ref 5–15)
AST: 24 U/L (ref 15–41)
Albumin: 4 g/dL (ref 3.5–5.0)
BILIRUBIN TOTAL: 0.5 mg/dL (ref 0.3–1.2)
BUN: 16 mg/dL (ref 6–20)
CALCIUM: 8.7 mg/dL — AB (ref 8.9–10.3)
CO2: 27 mmol/L (ref 22–32)
Chloride: 102 mmol/L (ref 101–111)
Creatinine, Ser: 0.7 mg/dL (ref 0.44–1.00)
GFR calc Af Amer: >60 mL/min (ref >60)
Glucose, Bld: 131 mg/dL — ABNORMAL HIGH (ref 65–99)
POTASSIUM: 3.9 mmol/L (ref 3.5–5.1)
Sodium: 137 mmol/L (ref 135–145)
TOTAL PROTEIN: 6.9 g/dL (ref 6.5–8.1)

## 2017-06-24 LAB — URINALYSIS, COMPLETE (UACMP) WITH MICROSCOPIC
BILIRUBIN URINE: NEGATIVE
Glucose, UA: NEGATIVE mg/dL
KETONES UR: NEGATIVE mg/dL
NITRITE: NEGATIVE
PH: 6 (ref 5.0–8.0)
Protein, ur: 30 mg/dL — AB
SPECIFIC GRAVITY, URINE: 1.006 (ref 1.005–1.030)
SQUAMOUS EPITHELIAL / LPF: NONE SEEN

## 2017-06-24 MED ORDER — HEPARIN SOD (PORK) LOCK FLUSH 100 UNIT/ML IV SOLN
500.0000 [IU] | Freq: Once | INTRAVENOUS | Status: AC | PRN
  Administered 2017-06-24: 500 [IU]
  Filled 2017-06-24: qty 5

## 2017-06-24 MED ORDER — SODIUM CHLORIDE 0.9 % IV SOLN
Freq: Once | INTRAVENOUS | Status: AC
  Administered 2017-06-24: 11:00:00 via INTRAVENOUS
  Filled 2017-06-24: qty 1000

## 2017-06-24 MED ORDER — FAMOTIDINE IN NACL 20-0.9 MG/50ML-% IV SOLN
20.0000 mg | Freq: Once | INTRAVENOUS | Status: AC
  Administered 2017-06-24: 20 mg via INTRAVENOUS
  Filled 2017-06-24: qty 50

## 2017-06-24 MED ORDER — PACLITAXEL CHEMO INJECTION 300 MG/50ML
80.0000 mg/m2 | Freq: Once | INTRAVENOUS | Status: AC
  Administered 2017-06-24: 144 mg via INTRAVENOUS
  Filled 2017-06-24: qty 24

## 2017-06-24 MED ORDER — DIPHENHYDRAMINE HCL 50 MG/ML IJ SOLN
25.0000 mg | Freq: Once | INTRAMUSCULAR | Status: AC
  Administered 2017-06-24: 25 mg via INTRAVENOUS
  Filled 2017-06-24: qty 1

## 2017-06-24 MED ORDER — SODIUM CHLORIDE 0.9 % IV SOLN
20.0000 mg | Freq: Once | INTRAVENOUS | Status: AC
  Administered 2017-06-24: 20 mg via INTRAVENOUS
  Filled 2017-06-24 (×2): qty 2

## 2017-06-24 NOTE — Progress Notes (Signed)
[place] OFFICE PROGRESS NOTE  Patient Care Team: [name], [name], MD (General Surgery) Requested, Self  Breast cancer metastasized to axillary lymph node [facility])   Staging form: Breast, [name], [name], [name], MD on 01/29/2015    Oncology History   # June 2016- LEFT BREAST CA;  [name] ; [name] ; [name]; Her 2 Neu POS; [place] from July OF 2016; [name]; adjuvant Herceptin [ Finished July 2017]; AUG 2017- Neratinib x5 days; DISCON sec to diarrhea  # MID OCT 2018- Right breast mass-Bx- ER/PR-NEG; Her 2 NEU POSITIVE 1~2.5cm;  [?NEW primary]  # MID-OCT 2018-METASTATIC RECURRENT-oh sternal mass; Left Ax LN [Bx]/periportal LN  # OCT 25th 2018- TAXOL-HERCEPTIN-PERJETA   -------------------------------------------------------------------------------   # MUGA scan- July 28th 2017- 67%.  # chronic gait/balance issues  # June 2017- left breast Bx- fat necrosis [[name]  # july 2017-  BRCA 1& 2- NEG.   MOLECULAR TESTING- F ONE- TPS- 0%; pending     Carcinoma of overlapping sites of left breast in female, estrogen receptor negative ([place])    INTERVAL HISTORY:  [name] 76 y.o.  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  As per the family patient noted to have increasing difficulty with urination/urgency.  No foul smell.  She has chronic urinary difficulties; recently gotten worse.   Mild diarrhea.  Denies any constipation. No skin rash. No fever chills. No tingling, numbness.  Cough slightly improved.  Having intermittent episodes of epistaxis.   REVIEW OF SYSTEMS:  A complete 10 point  review of system is done which is negative except mentioned above/history of present illness.   PAST MEDICAL HISTORY :  Past Medical History:  Diagnosis Date  . Arthritis   . 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 Breast cancer ([place]) 2016  . Breast cancer of upper-inner quadrant of left female breast ([place]) 12/15/14   Completed radiation end of December and finished chemotherapy 2 weeks ago, Left breast invasive mammary carcinoma, [name]; Grade 3, [name], [name], [name], .  .Marland KitchenCataract    bilat   . 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 Hearing loss   . History of cancer chemotherapy   . History of radiation therapy   . Imbalance   . Memory impairment    seen by Dr SManuella Ghazi possible post crainiotomy from radiation  . Numbness and tingling    right hand   . Shoulder pain, right   . Wears glasses     PAST SURGICAL HISTORY :   Past Surgical History:  Procedure Laterality Date  . APPENDECTOMY  1950  . BRAIN SURGERY  1995   left frontal/temporal  . BREAST BIOPSY Left 1997  . BREAST BIOPSY Left 12/15/14   confirmed DCIS  . BREAST BIOPSY Left 01/08/2015   Procedure: BREAST BIOPSY WITH NEEDLE LOCALIZATION;  Surgeon: [name] MD;  Location: [facility] ORS;  Service: General;  Laterality: Left;  . BREAST LUMPECTOMY Left 01/08/2015   Procedure: LUMPECTOMY;  Surgeon: [name] MD;  Location: [facility] ORS;  Service: General;  Laterality: Left;  . COLONOSCOPY  2010   Dr. [name]  . PORTACATH PLACEMENT Right 01/16/2015   Procedure: INSERTION PORT-A-CATH;  Surgeon: [name], MD;  Location: [facility] ORS;  Service: General;  Laterality: Right;  . SENTINEL NODE BIOPSY Left 01/16/2015   Procedure: SENTINEL NODE BIOPSY;  Surgeon: [name], MD;  Location: [facility] ORS;  Service: General;  Laterality: Left;  . TOTAL HIP ARTHROPLASTY Right 09/04/2015   Procedure: RIGHT TOTAL HIP ARTHROPLASTY ANTERIOR APPROACH;  Surgeon: [name], MD;  Location[facility];  Service: Orthopedics;  Laterality: Right;    FAMILY HISTORY :   Family History  Problem Relation Age of Onset  . Breast cancer Sister 70  . Addison's disease Mother   . Stroke Father     SOCIAL HISTORY:   Social History   Tobacco Use  . Smoking status: Former Smoker    Packs/day: 0.25    Years: 5.00    Pack years: 1.25    Types: Cigarettes    Last attempt to quit: 07/28/1972    Years since quitting: 44.9  . Smokeless tobacco: Never Used  Substance Use Topics  . Alcohol use: Yes    Alcohol/week: 0.6 - 1.2 oz    Types: 1 - 2 Glasses of wine per week    Comment: 1 Glass Wine / Night  . Drug use: No    ALLERGIES:  is allergic to donepezil.  MEDICATIONS:  Current Outpatient Medications  Medication Sig Dispense Refill  . Ascorbic Acid (VITAMIN C) 100 MG tablet Take 100 mg by mouth every morning.     . Calcium Carbonate-Vit D-Min (CALTRATE 600+D PLUS MINERALS) 600-800 MG-UNIT TABS Take 1 tablet by mouth every morning.    . cephALEXin (KEFLEX) 500 MG capsule Take 1 capsule (500 mg total) 3 (three) times daily by mouth. 21 capsule 0  . ETODOLAC PO Take 1 tablet by mouth daily.    . ferrous sulfate 325 (65 FE) MG tablet Take 1 tablet (325 mg total) by mouth 3 (three) times daily after meals.  3  . lidocaine-prilocaine (EMLA) cream Apply 1 application topically as needed. 30 g 3  . loratadine (CLARITIN) 10 MG tablet Take 10 mg by mouth daily as needed.     . naproxen sodium (ALEVE) 220 MG tablet Take 220 mg daily as needed by mouth (pain).    . polyvinyl alcohol (LIQUIFILM TEARS) 1.4 % ophthalmic solution Place 1 drop into both eyes as needed for dry eyes.     . traMADol (ULTRAM) 50 MG tablet Take 50 mg by mouth as needed.     . vitamin E 400 UNIT capsule Take 400 Units by mouth daily.     . ciprofloxacin (CIPRO) 500 MG tablet Take 1 tablet (500 mg total) by mouth 2 (two) times daily. 14 tablet 0  . ondansetron (ZOFRAN) 8 MG tablet Take 1 tablet (8 mg  total) every 8 (eight) hours as needed by mouth for nausea or vomiting (start 3 days; after chemo). (Patient not taking: Reported on 06/24/2017) 40 tablet 1  . prochlorperazine (COMPAZINE) 10 MG tablet Take 1 tablet (10 mg total) every 6 (six) hours as needed by mouth for nausea or vomiting. (Patient not taking: Reported on 06/24/2017) 40 tablet 1   No current facility-administered medications for this visit.     PHYSICAL EXAMINATION: ECOG PERFORMANCE STATUS: 1 - Symptomatic but completely ambulatory  BP 125/74 (BP Location: Left Arm, Patient Position: Sitting)  Pulse (!) 103   Temp 98.1 F (36.7 C)   Resp 16   There were no vitals filed for this visit.  GENERAL: Well-nourished well-developed; Alert, no distress and comfortable.  With her husband.  Walks with a cane. EYES: no pallor or icterus OROPHARYNX: no thrush or ulceration; good dentition  NECK: supple, no masses felt LYMPH:  palpable lymphadenopathy Left supraclavicular region ; left cervical region. Adenopathy felt in the left axillary/improved. LUNGS: clear to auscultation and  No wheeze or crackles HEART/CVS: regular rate & rhythm and no murmurs; No lower extremity edema ABDOMEN:abdomen soft, non-tender and normal bowel sounds Musculoskeletal:no cyanosis of digits and no clubbing; 1 cm raised lesion noted in the sternal area/improved. PSYCH: alert & oriented x 3 with fluent speech NEURO: no focal motor/sensory deficits SKIN:  no rashes or significant lesions; folliculitis noted left hand.  LABORATORY DATA:  I have reviewed the data as listed    Component Value Date/Time   NA 137 06/24/2017 0911   K 3.9 06/24/2017 0911   CL 102 06/24/2017 0911   CO2 27 06/24/2017 0911   GLUCOSE 131 (H) 06/24/2017 0911   BUN 16 06/24/2017 0911   CREATININE 0.70 06/24/2017 0911   CALCIUM 8.7 (L) 06/24/2017 0911   PROT 6.9 06/24/2017 0911   ALBUMIN 4.0 06/24/2017 0911   AST 24 06/24/2017 0911   ALT 22 06/24/2017 0911   ALKPHOS 54  06/24/2017 0911   BILITOT 0.5 06/24/2017 0911   GFRNONAA >60 06/24/2017 0911   GFRAA >60 06/24/2017 0911    No results found for: SPEP, UPEP  Lab Results  Component Value Date   WBC 5.9 06/24/2017   NEUTROABS 4.6 06/24/2017   HGB 12.5 06/24/2017   HCT 37.2 06/24/2017   MCV 92.0 06/24/2017   PLT 425 06/24/2017      Chemistry      Component Value Date/Time   NA 137 06/24/2017 0911   K 3.9 06/24/2017 0911   CL 102 06/24/2017 0911   CO2 27 06/24/2017 0911   BUN 16 06/24/2017 0911   CREATININE 0.70 06/24/2017 0911      Component Value Date/Time   CALCIUM 8.7 (L) 06/24/2017 0911   ALKPHOS 54 06/24/2017 0911   AST 24 06/24/2017 0911   ALT 22 06/24/2017 0911   BILITOT 0.5 06/24/2017 0911     No  focal wall motion abnormality of the left ventricle. Calculated left ventricular ejection fraction equals 67%. Previously 61% on 11/13/2015 IMPRESSION: Left ventricular ejection fraction equals 67 %. Electronically Signed   By: Suzy Bouchard M.D.   On: 02/20/2016 15:02  RADIOGRAPHIC STUDIES: I have personally reviewed the radiological images as listed and agreed with the findings in the report. No results found.  IMPRESSION: 1. Hypermetabolic recurrent/metastatic disease within the neck, chest, abdomen as detailed above. 2.  Aortic Atherosclerosis (ICD10-I70.0). 3. Hepatic steatosis. 4. Uterine fibroids. 5. 3.8 cm simple appearing right adnexal/ovarian lesion should be considered for nonemergent pelvic ultrasound.   Electronically Signed   By: Abigail Miyamoto M.D.   On: 05/13/2017 09:46 ASSESSMENT & PLAN:  Carcinoma of overlapping sites of left breast in female, estrogen receptor negative ([place]) # Metastatic ER/[name] HER-2/neu positive- currently on Taxol Herceptin perjeta; she is currently status post cycle #1. Tolerated chemotherapy well- except for mild-mod diarrhea.   # Proceed with cycle #2 day15 of weekly Taxol.  Clinical response noted; will get scans  after third cycle.   # Increased urinary frequency/urgency- recheck UA; culture; defer to [facility]  #  Dementia-/chronic debility/ataxia- STABLE.   # epistaxis- recommend nasal spray;   # Diarrhea- G-2; recommend imodium.   # cough at night- ? Allergic/Post nasal drip.   # Foundation One testing -pending.   # Weekly chemotherapy; follow-up in 2 weeks chemotherapy/labs.  Orders Placed This Encounter  Procedures  . Urine Culture    Standing Status:   Future    Number of Occurrences:   1    Standing Expiration Date:   08/24/2017  . Urinalysis, Complete w Microscopic    Standing Status:   Future    Number of Occurrences:   1    Standing Expiration Date:   08/24/2017   All questions were answered. The patient knows to call the clinic with any problems, questions or concerns.      Cammie Sickle, MD 06/25/2017 6:45 PM

## 2017-06-24 NOTE — Assessment & Plan Note (Addendum)
#  Metastatic ER/[name] HER-2/neu positive- currently on Taxol Herceptin perjeta; she is currently status post cycle #1. Tolerated chemotherapy well- except for mild-mod diarrhea.   # Proceed with cycle #2 day15 of weekly Taxol.  Clinical response noted; will get scans after third cycle.   # Increased urinary frequency/urgency- recheck UA; culture; defer to [facility]  # Dementia-/chronic debility/ataxia- STABLE.   # epistaxis- recommend nasal spray;   # Diarrhea- G-2; recommend imodium.   # cough at night- ? Allergic/Post nasal drip.   # Foundation One testing -pending.   # Weekly chemotherapy; follow-up in 2 weeks chemotherapy/labs.  Addendum: UA positive for infection; ciprofloxacin called.  Cultures pending.

## 2017-06-25 ENCOUNTER — Telehealth: Payer: Self-pay | Admitting: Internal Medicine

## 2017-06-25 MED ORDER — CIPROFLOXACIN HCL 500 MG PO TABS: 500.0000 mg | ORAL_TABLET | Freq: Two times a day (BID) | ORAL | 0 refills | Status: DC

## 2017-06-25 NOTE — Telephone Encounter (Signed)
Please inform patient/husband-likely UTI; awaiting cultures.  Ciprofloxacin 500 BID- sent.

## 2017-06-25 NOTE — Telephone Encounter (Signed)
Spoke with patient's husband. He states that his wife saw Dr. Rogers Blocker today (urology). Dr. Rogers Blocker stated that pt's urine was very "cloudy and he suspected that my wife also had a UTI. Dr. Rogers Blocker promised to send a prescription to patient's pharmacy. I don't know if he has done this yet or not." I explained to the husband that Dr. Jacinto Reap sent a prescription for Cipro to [facility]. He stated that he would go ahead and pick this prescription up. If there were any discrepancy in the actual prescription that Dr. Rogers Blocker was to send in, he would call our office and discuss further. For now, husband states that he will pick up NEW rx that Dr. Jacinto Reap sent to pharmacy. He thanked me for calling him.

## 2017-07-01 ENCOUNTER — Ambulatory Visit: Payer: Medicare Other | Admitting: Internal Medicine

## 2017-07-01 ENCOUNTER — Other Ambulatory Visit: Payer: Medicare Other

## 2017-07-01 ENCOUNTER — Encounter: Payer: Self-pay | Admitting: Internal Medicine

## 2017-07-01 ENCOUNTER — Ambulatory Visit: Payer: Medicare Other

## 2017-07-08 ENCOUNTER — Inpatient Hospital Stay: Payer: Medicare Other

## 2017-07-08 ENCOUNTER — Inpatient Hospital Stay: Payer: Medicare Other | Attending: Internal Medicine | Admitting: Internal Medicine

## 2017-07-08 VITALS — BP 131/85 | Temp 98.1°F | Resp 16 | Wt 164.0 lb

## 2017-07-08 DIAGNOSIS — R197 Diarrhea, unspecified: Secondary | ICD-10-CM | POA: Insufficient documentation

## 2017-07-08 DIAGNOSIS — R040 Epistaxis: Secondary | ICD-10-CM | POA: Insufficient documentation

## 2017-07-08 DIAGNOSIS — Z87891 Personal history of nicotine dependence: Secondary | ICD-10-CM | POA: Diagnosis not present

## 2017-07-08 DIAGNOSIS — C7981 Secondary malignant neoplasm of breast: Secondary | ICD-10-CM | POA: Insufficient documentation

## 2017-07-08 DIAGNOSIS — F0390 Unspecified dementia without behavioral disturbance: Secondary | ICD-10-CM | POA: Insufficient documentation

## 2017-07-08 DIAGNOSIS — C50812 Malignant neoplasm of overlapping sites of left female breast: Secondary | ICD-10-CM

## 2017-07-08 DIAGNOSIS — Z5111 Encounter for antineoplastic chemotherapy: Secondary | ICD-10-CM | POA: Diagnosis not present

## 2017-07-08 DIAGNOSIS — Z171 Estrogen receptor negative status [ER-]: Secondary | ICD-10-CM | POA: Insufficient documentation

## 2017-07-08 DIAGNOSIS — C50212 Malignant neoplasm of upper-inner quadrant of left female breast: Secondary | ICD-10-CM

## 2017-07-08 DIAGNOSIS — Z79899 Other long term (current) drug therapy: Secondary | ICD-10-CM | POA: Diagnosis not present

## 2017-07-08 DIAGNOSIS — M25551 Pain in right hip: Secondary | ICD-10-CM | POA: Diagnosis not present

## 2017-07-08 DIAGNOSIS — Z923 Personal history of irradiation: Secondary | ICD-10-CM | POA: Insufficient documentation

## 2017-07-08 DIAGNOSIS — K760 Fatty (change of) liver, not elsewhere classified: Secondary | ICD-10-CM | POA: Insufficient documentation

## 2017-07-08 DIAGNOSIS — D259 Leiomyoma of uterus, unspecified: Secondary | ICD-10-CM | POA: Diagnosis not present

## 2017-07-08 DIAGNOSIS — I700 Atherosclerosis of aorta: Secondary | ICD-10-CM | POA: Diagnosis not present

## 2017-07-08 LAB — COMPREHENSIVE METABOLIC PANEL
ALK PHOS: 55 U/L (ref 38–126)
ALT: 29 U/L (ref 14–54)
AST: 29 U/L (ref 15–41)
Albumin: 4 g/dL (ref 3.5–5.0)
Anion gap: 8 (ref 5–15)
BUN: 14 mg/dL (ref 6–20)
CALCIUM: 8.8 mg/dL — AB (ref 8.9–10.3)
CHLORIDE: 102 mmol/L (ref 101–111)
CO2: 27 mmol/L (ref 22–32)
CREATININE: 0.72 mg/dL (ref 0.44–1.00)
GFR calc non Af Amer: >60 mL/min (ref >60)
GLUCOSE: 122 mg/dL — AB (ref 65–99)
Potassium: 4.2 mmol/L (ref 3.5–5.1)
SODIUM: 137 mmol/L (ref 135–145)
Total Bilirubin: 0.6 mg/dL (ref 0.3–1.2)
Total Protein: 6.6 g/dL (ref 6.5–8.1)

## 2017-07-08 LAB — CBC WITH DIFFERENTIAL/PLATELET
BASOS PCT: 1 %
Basophils Absolute: 0 10*3/uL (ref 0–0.1)
EOS ABS: 0.2 10*3/uL (ref 0–0.7)
EOS PCT: 3 %
HCT: 37.3 % (ref 35.0–47.0)
HEMOGLOBIN: 12.3 g/dL (ref 12.0–16.0)
LYMPHS ABS: 0.6 10*3/uL — AB (ref 1.0–3.6)
Lymphocytes Relative: 11 %
MCH: 30.5 pg (ref 26.0–34.0)
MCHC: 33 g/dL (ref 32.0–36.0)
MCV: 92.3 fL (ref 80.0–100.0)
MONO ABS: 0.5 10*3/uL (ref 0.2–0.9)
MONOS PCT: 9 %
Neutro Abs: 4.3 10*3/uL (ref 1.4–6.5)
Neutrophils Relative %: 76 %
PLATELETS: 403 10*3/uL (ref 150–440)
RBC: 4.04 MIL/uL (ref 3.80–5.20)
RDW: 14.8 % — AB (ref 11.5–14.5)
WBC: 5.6 10*3/uL (ref 3.6–11.0)

## 2017-07-08 MED ORDER — HEPARIN SOD (PORK) LOCK FLUSH 100 UNIT/ML IV SOLN
500.0000 [IU] | Freq: Once | INTRAVENOUS | Status: AC | PRN
  Administered 2017-07-08: 500 [IU]

## 2017-07-08 MED ORDER — ACETAMINOPHEN 325 MG PO TABS
650.0000 mg | ORAL_TABLET | Freq: Once | ORAL | Status: AC
  Administered 2017-07-08: 650 mg via ORAL
  Filled 2017-07-08: qty 2

## 2017-07-08 MED ORDER — SODIUM CHLORIDE 0.9 % IV SOLN
Freq: Once | INTRAVENOUS | Status: AC
  Administered 2017-07-08: 11:00:00 via INTRAVENOUS
  Filled 2017-07-08: qty 1000

## 2017-07-08 MED ORDER — DIPHENHYDRAMINE HCL 50 MG/ML IJ SOLN
25.0000 mg | Freq: Once | INTRAMUSCULAR | Status: AC
  Administered 2017-07-08: 25 mg via INTRAVENOUS
  Filled 2017-07-08: qty 1

## 2017-07-08 MED ORDER — SODIUM CHLORIDE 0.9 % IV SOLN
20.0000 mg | Freq: Once | INTRAVENOUS | Status: AC
  Administered 2017-07-08: 20 mg via INTRAVENOUS
  Filled 2017-07-08: qty 2

## 2017-07-08 MED ORDER — HEPARIN SOD (PORK) LOCK FLUSH 100 UNIT/ML IV SOLN
INTRAVENOUS | Status: AC
  Filled 2017-07-08: qty 5

## 2017-07-08 MED ORDER — SODIUM CHLORIDE 0.9 % IV SOLN
420.0000 mg | Freq: Once | INTRAVENOUS | Status: AC
  Administered 2017-07-08: 420 mg via INTRAVENOUS
  Filled 2017-07-08: qty 14

## 2017-07-08 MED ORDER — FAMOTIDINE IN NACL 20-0.9 MG/50ML-% IV SOLN
20.0000 mg | Freq: Once | INTRAVENOUS | Status: AC
  Administered 2017-07-08: 20 mg via INTRAVENOUS

## 2017-07-08 MED ORDER — SODIUM CHLORIDE 0.9 % IV SOLN
450.0000 mg | Freq: Once | INTRAVENOUS | Status: AC
  Administered 2017-07-08: 450 mg via INTRAVENOUS
  Filled 2017-07-08: qty 21.43

## 2017-07-08 MED ORDER — SODIUM CHLORIDE 0.9 % IV SOLN
80.0000 mg/m2 | Freq: Once | INTRAVENOUS | Status: AC
  Administered 2017-07-08: 144 mg via INTRAVENOUS
  Filled 2017-07-08: qty 24

## 2017-07-08 NOTE — Progress Notes (Signed)
[place] OFFICE PROGRESS NOTE  Patient Care Team: [name], [name], MD (General Surgery) Requested, Self  Breast cancer metastasized to axillary lymph node [facility]   Staging form: Breast, [name], [name], [name], MD on 01/29/2015    Oncology History   # June 2016- LEFT BREAST CA;  [name] ; [name] ; [name]; Her 2 Neu POS; [place] from July OF 2016; [name]; adjuvant Herceptin [ Finished July 2017]; AUG 2017- Neratinib x5 days; DISCON sec to diarrhea  # MID OCT 2018- Right breast mass-Bx- ER/PR-NEG; Her 2 NEU POSITIVE 1~2.5cm;  [?NEW primary]  # MID-OCT 2018-METASTATIC RECURRENT-oh sternal mass; Left Ax LN [Bx]/periportal LN  # OCT 25th 2018- TAXOL-HERCEPTIN-PERJETA   -------------------------------------------------------------------------------   # MUGA scan- July 28th 2017- 67%.  # chronic gait/balance issues  # June 2017- left breast Bx- fat necrosis [[name]  # july 2017-  BRCA 1& 2- NEG.   MOLECULAR TESTING- F ONE- TPS- 0%; pending; ERB2 amplification; PI3K/RET amplification Others**     Carcinoma of overlapping sites of left breast in female, estrogen receptor negative ([place])    INTERVAL HISTORY: Patient a poor historian given mild dementia.   [name] 76 y.o.  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  At the last visit patient was noted to have a UTI-treated with ciprofloxacin.  Symptoms have resolved.  She also seen urologist.   Mild diarrhea.  Denies any constipation. No skin rash. No fever chills. No tingling, numbness-continues to have "restless legs".  Cough  resolved.  Intermittent episodes of epistaxis.   REVIEW OF SYSTEMS:  A complete 10 point review of system is done which is negative except mentioned above/history of present illness.   PAST MEDICAL HISTORY :  Past Medical History:  Diagnosis Date  . Arthritis   . 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 Breast cancer ([place]) 2016  . Breast cancer of upper-inner quadrant of left female breast ([place]) 12/15/14   Completed radiation end of December and finished chemotherapy 2 weeks ago, Left breast invasive mammary carcinoma, T1cN42mc (1.5 cm); Grade 3, [name], [name], [name], .  .Marland KitchenCataract    bilat   . 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 Hearing loss   . History of cancer chemotherapy   . History of radiation therapy   . Imbalance   . Memory impairment    seen by Dr SManuella Ghazi possible post crainiotomy from radiation  . Numbness and tingling    right hand   . Shoulder pain, right   . Wears glasses     PAST SURGICAL HISTORY :   Past Surgical History:  Procedure Laterality Date  . APPENDECTOMY  1950  . BRAIN SURGERY  1995   left frontal/temporal  . BREAST BIOPSY Left 1997  . BREAST BIOPSY Left 12/15/14   confirmed DCIS  . BREAST BIOPSY Left 01/08/2015   Procedure: BREAST BIOPSY WITH NEEDLE LOCALIZATION;  Surgeon: [name] MD;  Location: [facility] ORS;  Service: General;  Laterality: Left;  . BREAST LUMPECTOMY  Left 01/08/2015   Procedure: LUMPECTOMY;  Surgeon: [name], MD;  Location: [facility] ORS;  Service: General;  Laterality: Left;  . COLONOSCOPY  2010   Dr. [name]  . PORTACATH PLACEMENT Right 01/16/2015   Procedure: INSERTION PORT-A-CATH;  Surgeon: [name], MD;  Location: [facility] ORS;  Service: General;  Laterality: Right;  . SENTINEL NODE BIOPSY Left 01/16/2015   Procedure: SENTINEL NODE BIOPSY;  Surgeon: [name], MD;  Location: [facility] ORS;  Service: General;  Laterality: Left;  . TOTAL HIP ARTHROPLASTY  Right 09/04/2015   Procedure: RIGHT TOTAL HIP ARTHROPLASTY ANTERIOR APPROACH;  Surgeon: [name], MD;  Location: [facility];  Service: Orthopedics;  Laterality: Right;    FAMILY HISTORY :   Family History  Problem Relation Age of Onset  . Breast cancer Sister 35  . Addison's disease Mother   . Stroke Father     SOCIAL HISTORY:   Social History   Tobacco Use  . Smoking status: Former Smoker    Packs/day: 0.25    Years: 5.00    Pack years: 1.25    Types: Cigarettes    Last attempt to quit: 07/28/1972    Years since quitting: 44.9  . Smokeless tobacco: Never Used  Substance Use Topics  . Alcohol use: Yes    Alcohol/week: 0.6 - 1.2 oz    Types: 1 - 2 Glasses of wine per week    Comment: 1 Glass Wine / Night  . Drug use: No    ALLERGIES:  is allergic to donepezil.  MEDICATIONS:  Current Outpatient Medications  Medication Sig Dispense Refill  . Ascorbic Acid (VITAMIN C) 100 MG tablet Take 100 mg by mouth every morning.     . Calcium Carbonate-Vit D-Min (CALTRATE 600+D PLUS MINERALS) 600-800 MG-UNIT TABS Take 1 tablet by mouth every morning.    . cephALEXin (KEFLEX) 500 MG capsule Take 1 capsule (500 mg total) 3 (three) times daily by mouth. 21 capsule 0  . ETODOLAC PO Take 1 tablet by mouth daily.    . ferrous sulfate 325 (65 FE) MG tablet Take 1 tablet (325 mg total) by mouth 3 (three) times daily after meals.  3  . lidocaine-prilocaine (EMLA) cream Apply 1 application topically as needed. 30 g 3  . loratadine (CLARITIN) 10 MG tablet Take 10 mg by mouth daily as needed.     . mirabegron ER (MYRBETRIQ) 50 MG TB24 tablet Take 50 mg by mouth daily.    . naproxen sodium (ALEVE) 220 MG tablet Take 220 mg daily as needed by mouth (pain).    . ondansetron (ZOFRAN) 8 MG tablet Take 1 tablet (8 mg total) every 8 (eight) hours as needed by mouth for nausea or vomiting (start 3 days; after chemo). 40 tablet 1  . polyvinyl alcohol (LIQUIFILM TEARS) 1.4 % ophthalmic solution Place 1 drop  into both eyes as needed for dry eyes.     Marland Kitchen prochlorperazine (COMPAZINE) 10 MG tablet Take 1 tablet (10 mg total) every 6 (six) hours as needed by mouth for nausea or vomiting. 40 tablet 1  . traMADol (ULTRAM) 50 MG tablet Take 50 mg by mouth as needed.     . vitamin E 400 UNIT capsule Take 400 Units by mouth daily.     . ciprofloxacin (CIPRO) 500 MG tablet Take 1 tablet (500 mg total) by mouth 2 (two) times daily. (Patient not taking: Reported on 07/08/2017) 14 tablet 0   No current facility-administered medications for this visit.  PHYSICAL EXAMINATION: ECOG PERFORMANCE STATUS: 1 - Symptomatic but completely ambulatory  BP 131/85 (BP Location: Left Arm, Patient Position: Sitting)   Temp 98.1 F (36.7 C)   Resp 16   Wt 164 lb (74.4 kg)   BMI 30.99 kg/m   Filed Weights   07/08/17 1014  Weight: 164 lb (74.4 kg)    GENERAL: Well-nourished well-developed; Alert, no distress and comfortable.  With her husband.  Walks with a cane. EYES: no pallor or icterus OROPHARYNX: no thrush or ulceration; good dentition  NECK: supple, no masses felt LYMPH:  palpable lymphadenopathy Left supraclavicular region ; left cervical region. Adenopathy felt in the left axillary/improved. LUNGS: clear to auscultation and  No wheeze or crackles HEART/CVS: regular rate & rhythm and no murmurs; No lower extremity edema ABDOMEN:abdomen soft, non-tender and normal bowel sounds Musculoskeletal:no cyanosis of digits and no clubbing; 1 cm raised lesion noted in the sternal area/improved. PSYCH: alert & oriented x 3 with fluent speech NEURO: no focal motor/sensory deficits SKIN:  no rashes or significant lesions; folliculitis noted left hand.  LABORATORY DATA:  I have reviewed the data as listed    Component Value Date/Time   NA 137 07/08/2017 0910   K 4.2 07/08/2017 0910   CL 102 07/08/2017 0910   CO2 27 07/08/2017 0910   GLUCOSE 122 (H) 07/08/2017 0910   BUN 14 07/08/2017 0910   CREATININE 0.72  07/08/2017 0910   CALCIUM 8.8 (L) 07/08/2017 0910   PROT 6.6 07/08/2017 0910   ALBUMIN 4.0 07/08/2017 0910   AST 29 07/08/2017 0910   ALT 29 07/08/2017 0910   ALKPHOS 55 07/08/2017 0910   BILITOT 0.6 07/08/2017 0910   GFRNONAA >60 07/08/2017 0910   GFRAA >60 07/08/2017 0910    No results found for: SPEP, UPEP  Lab Results  Component Value Date   WBC 5.6 07/08/2017   NEUTROABS 4.3 07/08/2017   HGB 12.3 07/08/2017   HCT 37.3 07/08/2017   MCV 92.3 07/08/2017   PLT 403 07/08/2017      Chemistry      Component Value Date/Time   NA 137 07/08/2017 0910   K 4.2 07/08/2017 0910   CL 102 07/08/2017 0910   CO2 27 07/08/2017 0910   BUN 14 07/08/2017 0910   CREATININE 0.72 07/08/2017 0910      Component Value Date/Time   CALCIUM 8.8 (L) 07/08/2017 0910   ALKPHOS 55 07/08/2017 0910   AST 29 07/08/2017 0910   ALT 29 07/08/2017 0910   BILITOT 0.6 07/08/2017 0910     No  focal wall motion abnormality of the left ventricle. Calculated left ventricular ejection fraction equals 67%. Previously 61% on 11/13/2015 IMPRESSION: Left ventricular ejection fraction equals 67 %. Electronically Signed   By: Suzy Bouchard M.D.   On: 02/20/2016 15:02  RADIOGRAPHIC STUDIES: I have personally reviewed the radiological images as listed and agreed with the findings in the report. No results found.  IMPRESSION: 1. Hypermetabolic recurrent/metastatic disease within the neck, chest, abdomen as detailed above. 2.  Aortic Atherosclerosis (ICD10-I70.0). 3. Hepatic steatosis. 4. Uterine fibroids. 5. 3.8 cm simple appearing right adnexal/ovarian lesion should be considered for nonemergent pelvic ultrasound.   Electronically Signed   By: Abigail Miyamoto M.D.   On: 05/13/2017 09:46 ASSESSMENT & PLAN:  Carcinoma of overlapping sites of left breast in female, estrogen receptor negative ([place]) # Metastatic ER/[name] HER-2/neu positive- currently on Taxol Herceptin perjeta; she is currently  status post cycle #2. Tolerated chemotherapy well- except  for mild-mod diarrhea.   # Proceed with cycle #3 day1Taxol-H-P.  Clinical response noted; will get scans after third cycle.   # UTI- s/p cipro; resolved; myrbectriq prn as per Dr.Wolfe.   # Dementia-/chronic debility/ataxia- STABLE.   # epistaxis- recommend nasal spray; stable.   # Diarrhea- G-2; recommend imodium.   # Foundation One testing -no obvious targetable mutations.    # Weekly chemotherapy; follow-up in X-MD 2 weeks chemotherapy/labs. CT chest scan in 4 weeks-prior/MD/Labs-THP.   Orders Placed This Encounter  Procedures  . CT Chest W Contrast    Standing Status:   Future    Standing Expiration Date:   07/08/2018    Scheduling Instructions:     Around 7-8th of jan 2019.    Order Specific Question:   If indicated for the ordered procedure, I authorize the administration of contrast media per Radiology protocol    Answer:   Yes    Order Specific Question:   Preferred imaging location?    Answer:   [facility]    Order Specific Question:   Radiology Contrast Protocol - do NOT remove file path    Answer:   [email or web address]   All questions were answered. The patient knows to call the clinic with any problems, questions or concerns.      Cammie Sickle, MD 07/10/2017 8:45 AM

## 2017-07-08 NOTE — Assessment & Plan Note (Addendum)
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 Tolerated chemotherapy well- except for mild-mod diarrhea.   # Proceed with cycle #3 day1Taxol-H-P.  Clinical response noted; will get scans after third cycle.   # UTI- s/p cipro; resolved; myrbectriq prn as per Dr.Wolfe.   # Dementia-/chronic debility/ataxia- STABLE.   # epistaxis- recommend nasal spray; stable.   # Diarrhea- G-2; recommend imodium.   # Foundation One testing -no obvious targetable mutations.    # Weekly chemotherapy; follow-up in [place] in 2 weeks;   chemotherapy/labs. CT chest scan in 4 weeks-prior/MD/Labs-THP.

## 2017-07-10 ENCOUNTER — Telehealth: Payer: Self-pay | Admitting: Internal Medicine

## 2017-07-10 NOTE — Telephone Encounter (Signed)
apts have been scheduled

## 2017-07-10 NOTE — Telephone Encounter (Signed)
Cancel the patient's appointment on July 30, 2017;   Make appointments for August 05, 2017-MD/labs/chemotherapy- taxol-herceptin+perjeta.  CT scans-1-2 days prior to MD visit.

## 2017-07-15 ENCOUNTER — Inpatient Hospital Stay: Payer: Medicare Other

## 2017-07-15 ENCOUNTER — Encounter: Payer: Self-pay | Admitting: General Surgery

## 2017-07-15 VITALS — BP 128/73 | HR 89 | Temp 97.3°F | Resp 18 | Wt 164.0 lb

## 2017-07-15 DIAGNOSIS — C50812 Malignant neoplasm of overlapping sites of left female breast: Secondary | ICD-10-CM

## 2017-07-15 DIAGNOSIS — Z171 Estrogen receptor negative status [ER-]: Principal | ICD-10-CM

## 2017-07-15 DIAGNOSIS — Z5111 Encounter for antineoplastic chemotherapy: Secondary | ICD-10-CM | POA: Diagnosis not present

## 2017-07-15 LAB — COMPREHENSIVE METABOLIC PANEL
ALT: 29 U/L (ref 14–54)
AST: 30 U/L (ref 15–41)
Albumin: 4 g/dL (ref 3.5–5.0)
Alkaline Phosphatase: 51 U/L (ref 38–126)
Anion gap: 9 (ref 5–15)
BILIRUBIN TOTAL: 0.6 mg/dL (ref 0.3–1.2)
BUN: 16 mg/dL (ref 6–20)
CO2: 27 mmol/L (ref 22–32)
CREATININE: 0.67 mg/dL (ref 0.44–1.00)
Calcium: 8.8 mg/dL — ABNORMAL LOW (ref 8.9–10.3)
Chloride: 100 mmol/L — ABNORMAL LOW (ref 101–111)
Glucose, Bld: 127 mg/dL — ABNORMAL HIGH (ref 65–99)
POTASSIUM: 3.8 mmol/L (ref 3.5–5.1)
Sodium: 136 mmol/L (ref 135–145)
TOTAL PROTEIN: 6.5 g/dL (ref 6.5–8.1)

## 2017-07-15 LAB — CBC WITH DIFFERENTIAL/PLATELET
Basophils Absolute: 0 10*3/uL (ref 0–0.1)
Basophils Relative: 1 %
Eosinophils Absolute: 0.3 10*3/uL (ref 0–0.7)
Eosinophils Relative: 6 %
HEMATOCRIT: 37.3 % (ref 35.0–47.0)
HEMOGLOBIN: 12.3 g/dL (ref 12.0–16.0)
LYMPHS ABS: 0.6 10*3/uL — AB (ref 1.0–3.6)
LYMPHS PCT: 13 %
MCH: 30.7 pg (ref 26.0–34.0)
MCHC: 33 g/dL (ref 32.0–36.0)
MCV: 93 fL (ref 80.0–100.0)
MONOS PCT: 7 %
Monocytes Absolute: 0.3 10*3/uL (ref 0.2–0.9)
NEUTROS ABS: 3.2 10*3/uL (ref 1.4–6.5)
NEUTROS PCT: 73 %
Platelets: 355 10*3/uL (ref 150–440)
RBC: 4.01 MIL/uL (ref 3.80–5.20)
RDW: 15.1 % — ABNORMAL HIGH (ref 11.5–14.5)
WBC: 4.3 10*3/uL (ref 3.6–11.0)

## 2017-07-15 MED ORDER — HEPARIN SOD (PORK) LOCK FLUSH 100 UNIT/ML IV SOLN
500.0000 [IU] | Freq: Once | INTRAVENOUS | Status: AC | PRN
  Administered 2017-07-15: 500 [IU]
  Filled 2017-07-15: qty 5

## 2017-07-15 MED ORDER — SODIUM CHLORIDE 0.9 % IV SOLN
Freq: Once | INTRAVENOUS | Status: AC
  Administered 2017-07-15: 11:00:00 via INTRAVENOUS
  Filled 2017-07-15: qty 1000

## 2017-07-15 MED ORDER — SODIUM CHLORIDE 0.9 % IV SOLN
20.0000 mg | Freq: Once | INTRAVENOUS | Status: AC
  Administered 2017-07-15: 20 mg via INTRAVENOUS
  Filled 2017-07-15: qty 2

## 2017-07-15 MED ORDER — FAMOTIDINE IN NACL 20-0.9 MG/50ML-% IV SOLN
20.0000 mg | Freq: Once | INTRAVENOUS | Status: AC
  Administered 2017-07-15: 20 mg via INTRAVENOUS
  Filled 2017-07-15: qty 50

## 2017-07-15 MED ORDER — DIPHENHYDRAMINE HCL 50 MG/ML IJ SOLN
25.0000 mg | Freq: Once | INTRAMUSCULAR | Status: AC
  Administered 2017-07-15: 25 mg via INTRAVENOUS
  Filled 2017-07-15: qty 1

## 2017-07-15 MED ORDER — SODIUM CHLORIDE 0.9 % IV SOLN
80.0000 mg/m2 | Freq: Once | INTRAVENOUS | Status: AC
  Administered 2017-07-15: 144 mg via INTRAVENOUS
  Filled 2017-07-15: qty 24

## 2017-07-15 MED ORDER — SODIUM CHLORIDE 0.9% FLUSH
10.0000 mL | INTRAVENOUS | Status: DC | PRN
  Administered 2017-07-15: 10 mL
  Filled 2017-07-15: qty 10

## 2017-07-22 ENCOUNTER — Inpatient Hospital Stay: Payer: Medicare Other

## 2017-07-22 ENCOUNTER — Other Ambulatory Visit: Payer: Self-pay

## 2017-07-22 ENCOUNTER — Inpatient Hospital Stay (HOSPITAL_BASED_OUTPATIENT_CLINIC_OR_DEPARTMENT_OTHER): Payer: Medicare Other | Admitting: Internal Medicine

## 2017-07-22 ENCOUNTER — Encounter: Payer: Self-pay | Admitting: Internal Medicine

## 2017-07-22 VITALS — BP 120/76 | HR 74 | Resp 20

## 2017-07-22 VITALS — BP 130/78 | HR 70 | Temp 97.5°F | Resp 20

## 2017-07-22 DIAGNOSIS — F0390 Unspecified dementia without behavioral disturbance: Secondary | ICD-10-CM

## 2017-07-22 DIAGNOSIS — Z171 Estrogen receptor negative status [ER-]: Secondary | ICD-10-CM | POA: Diagnosis not present

## 2017-07-22 DIAGNOSIS — Z79899 Other long term (current) drug therapy: Secondary | ICD-10-CM | POA: Diagnosis not present

## 2017-07-22 DIAGNOSIS — C50812 Malignant neoplasm of overlapping sites of left female breast: Secondary | ICD-10-CM | POA: Diagnosis not present

## 2017-07-22 DIAGNOSIS — Z5111 Encounter for antineoplastic chemotherapy: Secondary | ICD-10-CM | POA: Diagnosis not present

## 2017-07-22 DIAGNOSIS — C7981 Secondary malignant neoplasm of breast: Secondary | ICD-10-CM | POA: Diagnosis not present

## 2017-07-22 DIAGNOSIS — E040 Nontoxic diffuse goiter: Secondary | ICD-10-CM

## 2017-07-22 DIAGNOSIS — M25551 Pain in right hip: Secondary | ICD-10-CM | POA: Diagnosis not present

## 2017-07-22 DIAGNOSIS — Z87891 Personal history of nicotine dependence: Secondary | ICD-10-CM

## 2017-07-22 DIAGNOSIS — Z923 Personal history of irradiation: Secondary | ICD-10-CM | POA: Diagnosis not present

## 2017-07-22 LAB — CBC WITH DIFFERENTIAL/PLATELET
Basophils Absolute: 0 10*3/uL (ref 0–0.1)
Basophils Relative: 1 %
EOS PCT: 7 %
Eosinophils Absolute: 0.3 10*3/uL (ref 0–0.7)
HEMATOCRIT: 36.9 % (ref 35.0–47.0)
Hemoglobin: 12.2 g/dL (ref 12.0–16.0)
LYMPHS ABS: 0.5 10*3/uL — AB (ref 1.0–3.6)
LYMPHS PCT: 14 %
MCH: 30.6 pg (ref 26.0–34.0)
MCHC: 33 g/dL (ref 32.0–36.0)
MCV: 92.8 fL (ref 80.0–100.0)
MONO ABS: 0.3 10*3/uL (ref 0.2–0.9)
Monocytes Relative: 8 %
NEUTROS ABS: 2.7 10*3/uL (ref 1.4–6.5)
Neutrophils Relative %: 70 %
PLATELETS: 335 10*3/uL (ref 150–440)
RBC: 3.97 MIL/uL (ref 3.80–5.20)
RDW: 15.5 % — AB (ref 11.5–14.5)
WBC: 3.8 10*3/uL (ref 3.6–11.0)

## 2017-07-22 LAB — COMPREHENSIVE METABOLIC PANEL
ALBUMIN: 4 g/dL (ref 3.5–5.0)
ALK PHOS: 53 U/L (ref 38–126)
ALT: 27 U/L (ref 14–54)
AST: 25 U/L (ref 15–41)
Anion gap: 8 (ref 5–15)
BUN: 16 mg/dL (ref 6–20)
CALCIUM: 8.7 mg/dL — AB (ref 8.9–10.3)
CHLORIDE: 97 mmol/L — AB (ref 101–111)
CO2: 30 mmol/L (ref 22–32)
CREATININE: 0.63 mg/dL (ref 0.44–1.00)
GFR calc Af Amer: >60 mL/min (ref >60)
GFR calc non Af Amer: >60 mL/min (ref >60)
GLUCOSE: 118 mg/dL — AB (ref 65–99)
Potassium: 4 mmol/L (ref 3.5–5.1)
SODIUM: 135 mmol/L (ref 135–145)
Total Bilirubin: 0.5 mg/dL (ref 0.3–1.2)
Total Protein: 6.5 g/dL (ref 6.5–8.1)

## 2017-07-22 MED ORDER — SODIUM CHLORIDE 0.9 % IV SOLN
Freq: Once | INTRAVENOUS | Status: AC
  Administered 2017-07-22: 13:00:00 via INTRAVENOUS
  Filled 2017-07-22: qty 1000

## 2017-07-22 MED ORDER — DIPHENHYDRAMINE HCL 50 MG/ML IJ SOLN
25.0000 mg | Freq: Once | INTRAMUSCULAR | Status: AC
  Administered 2017-07-22: 25 mg via INTRAVENOUS
  Filled 2017-07-22: qty 1

## 2017-07-22 MED ORDER — HEPARIN SOD (PORK) LOCK FLUSH 100 UNIT/ML IV SOLN
500.0000 [IU] | Freq: Once | INTRAVENOUS | Status: AC | PRN
  Administered 2017-07-22: 500 [IU]
  Filled 2017-07-22: qty 5

## 2017-07-22 MED ORDER — SODIUM CHLORIDE 0.9 % IV SOLN
80.0000 mg/m2 | Freq: Once | INTRAVENOUS | Status: AC
  Administered 2017-07-22: 144 mg via INTRAVENOUS
  Filled 2017-07-22: qty 24

## 2017-07-22 MED ORDER — FAMOTIDINE IN NACL 20-0.9 MG/50ML-% IV SOLN
20.0000 mg | Freq: Once | INTRAVENOUS | Status: AC
  Administered 2017-07-22: 20 mg via INTRAVENOUS
  Filled 2017-07-22: qty 50

## 2017-07-22 MED ORDER — SODIUM CHLORIDE 0.9 % IV SOLN
20.0000 mg | Freq: Once | INTRAVENOUS | Status: AC
  Administered 2017-07-22: 20 mg via INTRAVENOUS
  Filled 2017-07-22: qty 2

## 2017-07-22 NOTE — Progress Notes (Signed)
C/o new onset of low back and upper right hip pain. Has used tramadol daily x 5 days. Patient reports urinary frequency but reports improvement in UTI symptoms after being tx with cipro.

## 2017-07-22 NOTE — Progress Notes (Signed)
[place] OFFICE PROGRESS NOTE  Patient Care Team: [name], [name], MD (General Surgery) Requested, Self  Breast cancer metastasized to axillary lymph node [facility])   Staging form: Breast, [name], [name], [name], MD on 01/29/2015    Oncology History   # June 2016- LEFT BREAST CA;  [name] ; [name] ; [name]; Her 2 Neu POS; [place] from July OF 2016; [name]; adjuvant Herceptin [ Finished July 2017]; AUG 2017- Neratinib x5 days; DISCON sec to diarrhea  # MID OCT 2018- Right breast mass-Bx- ER/PR-NEG; Her 2 NEU POSITIVE 1~2.5cm;  [?NEW primary]  # MID-OCT 2018-METASTATIC RECURRENT-oh sternal mass; Left Ax LN [Bx]/periportal LN  # OCT 25th 2018- TAXOL-HERCEPTIN-PERJETA   -------------------------------------------------------------------------------   # MUGA scan- July 28th 2017- 67%.  # chronic gait/balance issues  # June 2017- left breast Bx- fat necrosis [[name]  # july 2017-  BRCA 1& 2- NEG.   MOLECULAR TESTING- F ONE- TPS- 0%; pending; ERB2 amplification; PI3K/RET amplification Others**     Carcinoma of overlapping sites of left breast in female, estrogen receptor negative ([place])    INTERVAL HISTORY: Patient a poor historian given mild dementia.   [name] 76 y.o.  female pleasant patient above history of  recurrent/metastatic breast cancer ER/[name] HER-2/neu positive currently status post cycle #3 day-8 of Taxol Herceptin perjeta is here for follow-up.  As per husband patient noticing to have right-sided hip pain for the last 1 week.  6 on a scale of 10.  No significant radiation.  Has been taking tramadol.  No falls.  No trauma.  Mild diarrhea.  Denies any constipation. No skin rash. No fever chills. No tingling,  numbness-continues to have "restless legs".  Cough resolved.  Intermittent episodes of epistaxis.   REVIEW OF SYSTEMS:  A complete 10 point review of system is done which is negative except mentioned above/history of present illness.   PAST MEDICAL HISTORY :  Past Medical History:  Diagnosis Date  . Arthritis   . 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 Breast cancer ([place]) 2016  . 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  .Marland KitchenCataract    bilat   . 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 Hearing loss   . History of cancer chemotherapy   . History of radiation therapy   . Imbalance   . Memory impairment    seen by Dr SManuella Ghazi possible post crainiotomy from radiation  . Numbness and tingling    right hand   . Shoulder pain, right   . Wears glasses     PAST SURGICAL HISTORY :   Past Surgical History:  Procedure Laterality Date  . APPENDECTOMY  1950  . BRAIN SURGERY  1995   left frontal/temporal  . BREAST BIOPSY Left 1997  . BREAST BIOPSY Left 12/15/14   confirmed DCIS  . BREAST BIOPSY Left 01/08/2015   Procedure: BREAST BIOPSY WITH NEEDLE LOCALIZATION;  Surgeon: [name] MD;  Location: [facility] ORS;  Service: General;  Laterality: Left;  . BREAST LUMPECTOMY Left 01/08/2015   Procedure: LUMPECTOMY;  Surgeon: [name], MD;  Location: [facility] ORS;  Service: General;  Laterality: Left;  . COLONOSCOPY  2010   Dr. [name]  . PORTACATH PLACEMENT Right 01/16/2015   Procedure: INSERTION PORT-A-CATH;  Surgeon: [name], MD;  Location: [facility] ORS;  Service: General;  Laterality: Right;  . SENTINEL NODE BIOPSY Left 01/16/2015   Procedure: SENTINEL NODE BIOPSY;  Surgeon: [name], MD;  Location: [facility] ORS;  Service: General;   Laterality: Left;  . TOTAL HIP ARTHROPLASTY Right 09/04/2015   Procedure: RIGHT TOTAL HIP ARTHROPLASTY ANTERIOR APPROACH;  Surgeon: [name], MD;  Location: [facility];  Service: Orthopedics;  Laterality: Right;    FAMILY HISTORY :   Family History  Problem Relation Age of Onset  . Breast cancer Sister 25  . Addison's disease Mother   . Stroke Father     SOCIAL HISTORY:   Social History   Tobacco Use  . Smoking status: Former Smoker    Packs/day: 0.25    Years: 5.00    Pack years: 1.25    Types: Cigarettes    Last attempt to quit: 07/28/1972    Years since quitting: 45.0  . Smokeless tobacco: Never Used  Substance Use Topics  . Alcohol use: Yes    Alcohol/week: 0.6 - 1.2 oz    Types: 1 - 2 Glasses of wine per week    Comment: 1 Glass Wine / Night  . Drug use: No    ALLERGIES:  is allergic to donepezil.  MEDICATIONS:  Current Outpatient Medications  Medication Sig Dispense Refill  . Ascorbic Acid (VITAMIN C) 100 MG tablet Take 100 mg by mouth every morning.     . Calcium Carbonate-Vit D-Min (CALTRATE 600+D PLUS MINERALS) 600-800 MG-UNIT TABS Take 1 tablet by mouth every morning.    . ferrous sulfate 325 (65 FE) MG tablet Take 1 tablet (325 mg total) by mouth 3 (three) times daily after meals.  3  . lidocaine-prilocaine (EMLA) cream Apply 1 application topically as needed. 30 g 3  . loratadine (CLARITIN) 10 MG tablet Take 10 mg by mouth daily as needed.     . mirabegron ER (MYRBETRIQ) 50 MG TB24 tablet Take 50 mg by mouth daily.    . naproxen sodium (ALEVE) 220 MG tablet Take 220 mg daily as needed by mouth (pain).    . polyvinyl alcohol (LIQUIFILM TEARS) 1.4 % ophthalmic solution Place 1 drop into both eyes as needed for dry eyes.     . traMADol (ULTRAM) 50 MG tablet Take 50 mg by mouth as needed.     . vitamin E 400 UNIT capsule Take 400 Units by mouth daily.     . ondansetron (ZOFRAN) 8 MG tablet Take 1 tablet (8 mg total) every 8 (eight) hours as needed by mouth for  nausea or vomiting (start 3 days; after chemo). (Patient not taking: Reported on 07/22/2017) 40 tablet 1  . prochlorperazine (COMPAZINE) 10 MG tablet Take 1 tablet (10 mg total) every 6 (six) hours as needed by mouth for nausea or vomiting. (Patient not taking: Reported on 07/22/2017) 40 tablet 1   No current facility-administered medications for this visit.     PHYSICAL EXAMINATION: ECOG PERFORMANCE STATUS: 1 - Symptomatic but completely ambulatory  BP 130/78   Pulse 70   Temp (!) 97.5 F (36.4 C) (Tympanic)   Resp 20   There  were no vitals filed for this visit.  GENERAL: Well-nourished well-developed; Alert, no distress and comfortable.  With her husband.  Walks with a cane. EYES: no pallor or icterus OROPHARYNX: no thrush or ulceration; good dentition  NECK: supple, no masses felt LYMPH:  palpable lymphadenopathy Left supraclavicular region ; left cervical region. Adenopathy felt in the left axillary/improved. LUNGS: clear to auscultation and  No wheeze or crackles HEART/CVS: regular rate & rhythm and no murmurs; No lower extremity edema ABDOMEN:abdomen soft, non-tender and normal bowel sounds Musculoskeletal:no cyanosis of digits and no clubbing; 1 cm raised lesion noted in the sternal area/improved. PSYCH: alert & oriented x 3 with fluent speech NEURO: no focal motor/sensory deficits SKIN:  no rashes or significant lesions; folliculitis noted left hand.  LABORATORY DATA:  I have reviewed the data as listed    Component Value Date/Time   NA 135 07/22/2017 1124   K 4.0 07/22/2017 1124   CL 97 (L) 07/22/2017 1124   CO2 30 07/22/2017 1124   GLUCOSE 118 (H) 07/22/2017 1124   BUN 16 07/22/2017 1124   CREATININE 0.63 07/22/2017 1124   CALCIUM 8.7 (L) 07/22/2017 1124   PROT 6.5 07/22/2017 1124   ALBUMIN 4.0 07/22/2017 1124   AST 25 07/22/2017 1124   ALT 27 07/22/2017 1124   ALKPHOS 53 07/22/2017 1124   BILITOT 0.5 07/22/2017 1124   GFRNONAA >60 07/22/2017 1124   GFRAA  >60 07/22/2017 1124    No results found for: SPEP, UPEP  Lab Results  Component Value Date   WBC 3.8 07/22/2017   NEUTROABS 2.7 07/22/2017   HGB 12.2 07/22/2017   HCT 36.9 07/22/2017   MCV 92.8 07/22/2017   PLT 335 07/22/2017      Chemistry      Component Value Date/Time   NA 135 07/22/2017 1124   K 4.0 07/22/2017 1124   CL 97 (L) 07/22/2017 1124   CO2 30 07/22/2017 1124   BUN 16 07/22/2017 1124   CREATININE 0.63 07/22/2017 1124      Component Value Date/Time   CALCIUM 8.7 (L) 07/22/2017 1124   ALKPHOS 53 07/22/2017 1124   AST 25 07/22/2017 1124   ALT 27 07/22/2017 1124   BILITOT 0.5 07/22/2017 1124     No  focal wall motion abnormality of the left ventricle. Calculated left ventricular ejection fraction equals 67%. Previously 61% on 11/13/2015 IMPRESSION: Left ventricular ejection fraction equals 67 %. Electronically Signed   By: Suzy Bouchard M.D.   On: 02/20/2016 15:02  RADIOGRAPHIC STUDIES: I have personally reviewed the radiological images as listed and agreed with the findings in the report. No results found.  IMPRESSION: 1. Hypermetabolic recurrent/metastatic disease within the neck, chest, abdomen as detailed above. 2.  Aortic Atherosclerosis (ICD10-I70.0). 3. Hepatic steatosis. 4. Uterine fibroids. 5. 3.8 cm simple appearing right adnexal/ovarian lesion should be considered for nonemergent pelvic ultrasound.   Electronically Signed   By: Abigail Miyamoto M.D.   On: 05/13/2017 09:46 ASSESSMENT & PLAN:  Carcinoma of overlapping sites of left breast in female, estrogen receptor negative ([place]) # Metastatic ER/[name] HER-2/neu positive- currently on Taxol Herceptin perjeta; she is currently status post cycle #2. Tolerated chemotherapy well- except for mild-mod diarrhea.   # Proceed with cycle #3 day15Taxol-H-P.  Clinical response noted; will get scans after third/this cycle.   # UTI- s/p cipro; resolved; myrbectriq prn as per Dr.Wolfe.   #  RIGHT hip pain-x 1 week; on tramadol 6/10 ; noi radiation; on tramadol 1-2 day; [  Dr.Miller].  Continue tramadol for now.  Would await imaging as planned  # Dementia-/chronic debility/ataxia- STABLE.   # epistaxis- cont nasal spray; stable.   # Foundation One testing -no obvious targetable mutations.    # follow up-  CT chest/Ab-pelvis scan /MD/Labs-THP on jan 9th as planned.   Orders Placed This Encounter  Procedures  . CT Abdomen Pelvis W Contrast    Standing Status:   Future    Standing Expiration Date:   07/22/2018    Scheduling Instructions:     Along with chest CT on jan 4th    Order Specific Question:   If indicated for the ordered procedure, I authorize the administration of contrast media per Radiology protocol    Answer:   Yes    Order Specific Question:   Preferred imaging location?    Answer:   [facility]    Order Specific Question:   Radiology Contrast Protocol - do NOT remove file path    Answer:   [email or web address]   All questions were answered. The patient knows to call the clinic with any problems, questions or concerns.      Cammie Sickle, MD 07/24/2017 7:10 AM

## 2017-07-22 NOTE — Assessment & Plan Note (Addendum)
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 Tolerated chemotherapy well- except for mild-mod diarrhea.   # Proceed with cycle #3 day15Taxol-H-P.  Clinical response noted; will get scans after third/this cycle.   # UTI- s/p cipro; resolved; myrbectriq prn as per Dr.Wolfe.   # RIGHT hip pain-x 1 week; on tramadol 6/10 ; noi radiation; on tramadol 1-2 day; [Dr.Miller].  Continue tramadol for now.  Would await imaging as planned  # Dementia-/chronic debility/ataxia- STABLE.   # epistaxis- cont nasal spray; stable.   # [employer] [facility] testing -no obvious targetable mutations.    # follow up-  CT chest/Ab-pelvis scan /MD/[facility] on jan 9th as planned.

## 2017-07-30 ENCOUNTER — Ambulatory Visit: Payer: Medicare Other | Admitting: Internal Medicine

## 2017-07-30 ENCOUNTER — Ambulatory Visit: Payer: Medicare Other

## 2017-07-30 ENCOUNTER — Other Ambulatory Visit: Payer: Medicare Other

## 2017-07-31 ENCOUNTER — Ambulatory Visit
Admission: RE | Admit: 2017-07-31 | Discharge: 2017-07-31 | Disposition: A | Discharge status: Home / Self Care | Payer: Medicare Other | Source: Ambulatory Visit | Attending: Internal Medicine | Admitting: Internal Medicine

## 2017-07-31 ENCOUNTER — Telehealth: Payer: Self-pay | Admitting: *Deleted

## 2017-07-31 DIAGNOSIS — M899 Disorder of bone, unspecified: Secondary | ICD-10-CM | POA: Insufficient documentation

## 2017-07-31 DIAGNOSIS — N8110 Cystocele, unspecified: Secondary | ICD-10-CM | POA: Diagnosis not present

## 2017-07-31 DIAGNOSIS — Z171 Estrogen receptor negative status [ER-]: Secondary | ICD-10-CM | POA: Insufficient documentation

## 2017-07-31 DIAGNOSIS — D259 Leiomyoma of uterus, unspecified: Secondary | ICD-10-CM | POA: Diagnosis not present

## 2017-07-31 DIAGNOSIS — C50812 Malignant neoplasm of overlapping sites of left female breast: Secondary | ICD-10-CM | POA: Diagnosis not present

## 2017-07-31 DIAGNOSIS — N8189 Other female genital prolapse: Secondary | ICD-10-CM | POA: Diagnosis not present

## 2017-07-31 DIAGNOSIS — I700 Atherosclerosis of aorta: Secondary | ICD-10-CM | POA: Insufficient documentation

## 2017-07-31 MED ORDER — IOPAMIDOL (ISOVUE-300) INJECTION 61%
100.0000 mL | Freq: Once | INTRAVENOUS | Status: AC | PRN
  Administered 2017-07-31: 100 mL via INTRAVENOUS

## 2017-07-31 MED ORDER — HEPARIN SOD (PORK) LOCK FLUSH 100 UNIT/ML IV SOLN
INTRAVENOUS | Status: AC
  Filled 2017-07-31: qty 5

## 2017-07-31 MED ORDER — IOPAMIDOL (ISOVUE-300) INJECTION 61%: 75.0000 mL | Freq: Once | INTRAVENOUS | Status: DC | PRN

## 2017-07-31 NOTE — Telephone Encounter (Signed)
Received a phone call from [facility] in Ct scan dept. Patient on CT scan table for scheduled ct scan of chest. Colletta Maryland noticed md also had order in [facility]l for abd/pelvis. This test was not linked to ct scan chest order. Patient has no prepped for abd/pelvis ct. I personally spoke with Dr. Rogue Bussing- v/o given to proceed with ct chest/abd/pelvis with IV contrast only. This was related to [facility] in CT scan dept.

## 2017-08-05 ENCOUNTER — Other Ambulatory Visit: Payer: Self-pay

## 2017-08-05 ENCOUNTER — Observation Stay
Admission: EM | Admit: 2017-08-05 | Discharge: 2017-08-07 | Disposition: A | Discharge status: Skilled Nursing Facility | Payer: Medicare Other | Attending: Internal Medicine | Admitting: Internal Medicine

## 2017-08-05 ENCOUNTER — Encounter
Admission: EM | Disposition: A | Discharge status: Skilled Nursing Facility | Payer: Self-pay | Source: Home / Self Care | Attending: Emergency Medicine

## 2017-08-05 ENCOUNTER — Telehealth: Payer: Self-pay | Admitting: Internal Medicine

## 2017-08-05 ENCOUNTER — Inpatient Hospital Stay: Payer: Medicare Other

## 2017-08-05 ENCOUNTER — Encounter: Payer: Self-pay | Admitting: Emergency Medicine

## 2017-08-05 ENCOUNTER — Observation Stay: Payer: Medicare Other | Admitting: Anesthesiology

## 2017-08-05 ENCOUNTER — Emergency Department: Payer: Medicare Other

## 2017-08-05 ENCOUNTER — Inpatient Hospital Stay: Payer: Medicare Other | Admitting: Internal Medicine

## 2017-08-05 ENCOUNTER — Telehealth: Payer: Self-pay

## 2017-08-05 DIAGNOSIS — S82891A Other fracture of right lower leg, initial encounter for closed fracture: Secondary | ICD-10-CM | POA: Diagnosis present

## 2017-08-05 DIAGNOSIS — Y92098 Other place in other non-institutional residence as the place of occurrence of the external cause: Secondary | ICD-10-CM | POA: Diagnosis not present

## 2017-08-05 DIAGNOSIS — S82841A Displaced bimalleolar fracture of right lower leg, initial encounter for closed fracture: Secondary | ICD-10-CM | POA: Diagnosis not present

## 2017-08-05 DIAGNOSIS — Z923 Personal history of irradiation: Secondary | ICD-10-CM | POA: Diagnosis not present

## 2017-08-05 DIAGNOSIS — R0602 Shortness of breath: Secondary | ICD-10-CM

## 2017-08-05 DIAGNOSIS — G309 Alzheimer's disease, unspecified: Secondary | ICD-10-CM | POA: Insufficient documentation

## 2017-08-05 DIAGNOSIS — W010XXA Fall on same level from slipping, tripping and stumbling without subsequent striking against object, initial encounter: Secondary | ICD-10-CM | POA: Insufficient documentation

## 2017-08-05 DIAGNOSIS — Z86011 Personal history of benign neoplasm of the brain: Secondary | ICD-10-CM | POA: Diagnosis not present

## 2017-08-05 DIAGNOSIS — Z9221 Personal history of antineoplastic chemotherapy: Secondary | ICD-10-CM | POA: Insufficient documentation

## 2017-08-05 DIAGNOSIS — Z853 Personal history of malignant neoplasm of breast: Secondary | ICD-10-CM | POA: Insufficient documentation

## 2017-08-05 DIAGNOSIS — Z888 Allergy status to other drugs, medicaments and biological substances status: Secondary | ICD-10-CM | POA: Diagnosis not present

## 2017-08-05 DIAGNOSIS — Y998 Other external cause status: Secondary | ICD-10-CM | POA: Insufficient documentation

## 2017-08-05 DIAGNOSIS — M1612 Unilateral primary osteoarthritis, left hip: Secondary | ICD-10-CM | POA: Insufficient documentation

## 2017-08-05 DIAGNOSIS — F0280 Dementia in other diseases classified elsewhere without behavioral disturbance: Secondary | ICD-10-CM | POA: Diagnosis not present

## 2017-08-05 DIAGNOSIS — Z79899 Other long term (current) drug therapy: Secondary | ICD-10-CM | POA: Insufficient documentation

## 2017-08-05 DIAGNOSIS — Z96641 Presence of right artificial hip joint: Secondary | ICD-10-CM | POA: Diagnosis not present

## 2017-08-05 DIAGNOSIS — E785 Hyperlipidemia, unspecified: Secondary | ICD-10-CM | POA: Insufficient documentation

## 2017-08-05 DIAGNOSIS — Y9389 Activity, other specified: Secondary | ICD-10-CM | POA: Insufficient documentation

## 2017-08-05 DIAGNOSIS — Z87891 Personal history of nicotine dependence: Secondary | ICD-10-CM | POA: Diagnosis not present

## 2017-08-05 DIAGNOSIS — S9304XA Dislocation of right ankle joint, initial encounter: Secondary | ICD-10-CM

## 2017-08-05 HISTORY — PX: ORIF ANKLE FRACTURE: SHX5408

## 2017-08-05 LAB — CBC WITH DIFFERENTIAL/PLATELET
Basophils Absolute: 0 10*3/uL (ref 0–0.1)
Basophils Relative: 1 %
EOS ABS: 0.2 10*3/uL (ref 0–0.7)
Eosinophils Relative: 2 %
HCT: 37.4 % (ref 35.0–47.0)
Hemoglobin: 12.5 g/dL (ref 12.0–16.0)
LYMPHS ABS: 1 10*3/uL (ref 1.0–3.6)
Lymphocytes Relative: 16 %
MCH: 31 pg (ref 26.0–34.0)
MCHC: 33.4 g/dL (ref 32.0–36.0)
MCV: 92.8 fL (ref 80.0–100.0)
MONO ABS: 0.5 10*3/uL (ref 0.2–0.9)
MONOS PCT: 8 %
Neutro Abs: 4.6 10*3/uL (ref 1.4–6.5)
Neutrophils Relative %: 73 %
Platelets: 360 10*3/uL (ref 150–440)
RBC: 4.03 MIL/uL (ref 3.80–5.20)
RDW: 15.9 % — AB (ref 11.5–14.5)
WBC: 6.3 10*3/uL (ref 3.6–11.0)

## 2017-08-05 LAB — COMPREHENSIVE METABOLIC PANEL
ALT: 24 U/L (ref 14–54)
AST: 25 U/L (ref 15–41)
Albumin: 4 g/dL (ref 3.5–5.0)
Alkaline Phosphatase: 55 U/L (ref 38–126)
Anion gap: 9 (ref 5–15)
BILIRUBIN TOTAL: 0.6 mg/dL (ref 0.3–1.2)
BUN: 15 mg/dL (ref 6–20)
CALCIUM: 8.8 mg/dL — AB (ref 8.9–10.3)
CO2: 26 mmol/L (ref 22–32)
Chloride: 100 mmol/L — ABNORMAL LOW (ref 101–111)
Creatinine, Ser: 0.86 mg/dL (ref 0.44–1.00)
GFR calc non Af Amer: >60 mL/min (ref >60)
Glucose, Bld: 122 mg/dL — ABNORMAL HIGH (ref 65–99)
POTASSIUM: 3.5 mmol/L (ref 3.5–5.1)
SODIUM: 135 mmol/L (ref 135–145)
TOTAL PROTEIN: 6.6 g/dL (ref 6.5–8.1)

## 2017-08-05 LAB — PROTIME-INR
INR: 0.95
PROTHROMBIN TIME: 12.6 s (ref 11.4–15.2)

## 2017-08-05 LAB — TROPONIN I: Troponin I: <0.03 ng/mL (ref <0.03)

## 2017-08-05 LAB — MRSA PCR SCREENING: MRSA BY PCR: NEGATIVE

## 2017-08-05 SURGERY — OPEN REDUCTION INTERNAL FIXATION (ORIF) ANKLE FRACTURE
Anesthesia: General | Site: Ankle | Laterality: Right | Wound class: Clean

## 2017-08-05 MED ORDER — POTASSIUM CHLORIDE IN NACL 20-0.9 MEQ/L-% IV SOLN
INTRAVENOUS | Status: DC
  Administered 2017-08-05: 15:00:00 via INTRAVENOUS
  Filled 2017-08-05 (×2): qty 1000

## 2017-08-05 MED ORDER — BUPIVACAINE HCL 0.5 % IJ SOLN
INTRAMUSCULAR | Status: DC | PRN
  Administered 2017-08-05: 30 mL

## 2017-08-05 MED ORDER — ONDANSETRON HCL 4 MG/2ML IJ SOLN
INTRAMUSCULAR | Status: AC
  Filled 2017-08-05: qty 2

## 2017-08-05 MED ORDER — FENTANYL CITRATE (PF) 100 MCG/2ML IJ SOLN
50.0000 ug | Freq: Once | INTRAMUSCULAR | Status: AC
  Administered 2017-08-05: 50 ug via INTRAVENOUS

## 2017-08-05 MED ORDER — EPHEDRINE SULFATE 50 MG/ML IJ SOLN
INTRAMUSCULAR | Status: DC | PRN
  Administered 2017-08-05: 5 mg via INTRAVENOUS

## 2017-08-05 MED ORDER — VITAMIN E 180 MG (400 UNIT) PO CAPS
400.0000 [IU] | ORAL_CAPSULE | Freq: Every day | ORAL | Status: DC
  Administered 2017-08-06 – 2017-08-07 (×2): 400 [IU] via ORAL
  Filled 2017-08-05 (×2): qty 1

## 2017-08-05 MED ORDER — CLINDAMYCIN PHOSPHATE 600 MG/50ML IV SOLN
600.0000 mg | Freq: Three times a day (TID) | INTRAVENOUS | Status: DC
  Administered 2017-08-05 – 2017-08-06 (×2): 600 mg via INTRAVENOUS
  Filled 2017-08-05 (×3): qty 50

## 2017-08-05 MED ORDER — FERROUS SULFATE 325 (65 FE) MG PO TABS: 325.0000 mg | ORAL_TABLET | Freq: Three times a day (TID) | ORAL | Status: DC

## 2017-08-05 MED ORDER — ONDANSETRON HCL 4 MG/2ML IJ SOLN: 4.0000 mg | Freq: Four times a day (QID) | INTRAMUSCULAR | Status: DC | PRN

## 2017-08-05 MED ORDER — ONDANSETRON HCL 4 MG PO TABS: 4.0000 mg | ORAL_TABLET | Freq: Four times a day (QID) | ORAL | Status: DC | PRN

## 2017-08-05 MED ORDER — FENTANYL CITRATE (PF) 100 MCG/2ML IJ SOLN
INTRAMUSCULAR | Status: DC | PRN
  Administered 2017-08-05: 50 ug via INTRAVENOUS
  Administered 2017-08-05: 25 ug via INTRAVENOUS
  Administered 2017-08-05: 50 ug via INTRAVENOUS

## 2017-08-05 MED ORDER — CLINDAMYCIN PHOSPHATE 600 MG/50ML IV SOLN
600.0000 mg | INTRAVENOUS | Status: AC
  Administered 2017-08-05: 600 mg via INTRAVENOUS
  Filled 2017-08-05: qty 50

## 2017-08-05 MED ORDER — ACETAMINOPHEN 650 MG RE SUPP: 650.0000 mg | Freq: Four times a day (QID) | RECTAL | Status: DC | PRN

## 2017-08-05 MED ORDER — ACETAMINOPHEN 325 MG PO TABS: 650.0000 mg | ORAL_TABLET | ORAL | Status: DC | PRN

## 2017-08-05 MED ORDER — FLEET ENEMA 7-19 GM/118ML RE ENEM: 1.0000 | ENEMA | Freq: Once | RECTAL | Status: DC | PRN

## 2017-08-05 MED ORDER — DEXAMETHASONE SODIUM PHOSPHATE 10 MG/ML IJ SOLN
INTRAMUSCULAR | Status: DC | PRN
  Administered 2017-08-05: 10 mg via INTRAVENOUS

## 2017-08-05 MED ORDER — METOCLOPRAMIDE HCL 5 MG/ML IJ SOLN: 5.0000 mg | Freq: Three times a day (TID) | INTRAMUSCULAR | Status: DC | PRN

## 2017-08-05 MED ORDER — FENTANYL CITRATE (PF) 100 MCG/2ML IJ SOLN: 25.0000 ug | INTRAMUSCULAR | Status: DC | PRN

## 2017-08-05 MED ORDER — SENNA 8.6 MG PO TABS
1.0000 | ORAL_TABLET | Freq: Two times a day (BID) | ORAL | Status: DC
  Administered 2017-08-05 – 2017-08-07 (×4): 8.6 mg via ORAL
  Filled 2017-08-05 (×4): qty 1

## 2017-08-05 MED ORDER — POLYETHYLENE GLYCOL 3350 17 G PO PACK: 17.0000 g | PACK | Freq: Every day | ORAL | Status: DC | PRN

## 2017-08-05 MED ORDER — PROPOFOL 10 MG/ML IV BOLUS
INTRAVENOUS | Status: AC
  Filled 2017-08-05: qty 20

## 2017-08-05 MED ORDER — CEFAZOLIN SODIUM-DEXTROSE 2-4 GM/100ML-% IV SOLN
2.0000 g | Freq: Three times a day (TID) | INTRAVENOUS | Status: DC
  Administered 2017-08-05 – 2017-08-06 (×2): 2 g via INTRAVENOUS
  Filled 2017-08-05 (×3): qty 100

## 2017-08-05 MED ORDER — LORATADINE 10 MG PO TABS: 10.0000 mg | ORAL_TABLET | Freq: Every day | ORAL | Status: DC | PRN

## 2017-08-05 MED ORDER — METHOCARBAMOL 500 MG PO TABS: 500.0000 mg | ORAL_TABLET | Freq: Four times a day (QID) | ORAL | Status: DC | PRN

## 2017-08-05 MED ORDER — METOCLOPRAMIDE HCL 10 MG PO TABS: 5.0000 mg | ORAL_TABLET | Freq: Three times a day (TID) | ORAL | Status: DC | PRN

## 2017-08-05 MED ORDER — BISACODYL 10 MG RE SUPP: 10.0000 mg | Freq: Every day | RECTAL | Status: DC | PRN

## 2017-08-05 MED ORDER — MORPHINE SULFATE (PF) 2 MG/ML IV SOLN: 1.0000 mg | INTRAVENOUS | Status: DC | PRN

## 2017-08-05 MED ORDER — MIDAZOLAM HCL 5 MG/5ML IJ SOLN
INTRAMUSCULAR | Status: AC
  Administered 2017-08-05: 2 mg via INTRAVENOUS
  Filled 2017-08-05: qty 5

## 2017-08-05 MED ORDER — FENTANYL CITRATE (PF) 100 MCG/2ML IJ SOLN
INTRAMUSCULAR | Status: AC
  Filled 2017-08-05: qty 2

## 2017-08-05 MED ORDER — LACTATED RINGERS IV SOLN
INTRAVENOUS | Status: DC | PRN
  Administered 2017-08-05: 18:00:00 via INTRAVENOUS

## 2017-08-05 MED ORDER — SODIUM CHLORIDE 0.45 % IV SOLN: INTRAVENOUS | Status: DC

## 2017-08-05 MED ORDER — CELECOXIB 200 MG PO CAPS
200.0000 mg | ORAL_CAPSULE | Freq: Two times a day (BID) | ORAL | Status: DC
  Administered 2017-08-05 – 2017-08-07 (×4): 200 mg via ORAL
  Filled 2017-08-05 (×4): qty 1

## 2017-08-05 MED ORDER — HYDROCODONE-ACETAMINOPHEN 5-325 MG PO TABS
1.0000 | ORAL_TABLET | ORAL | Status: DC | PRN
  Administered 2017-08-06 – 2017-08-07 (×3): 1 via ORAL
  Filled 2017-08-05 (×3): qty 1

## 2017-08-05 MED ORDER — ONDANSETRON HCL 4 MG/2ML IJ SOLN: 4.0000 mg | Freq: Once | INTRAMUSCULAR | Status: DC | PRN

## 2017-08-05 MED ORDER — SODIUM CHLORIDE 0.45 % IV SOLN
INTRAVENOUS | Status: DC
  Administered 2017-08-05: 21:00:00 via INTRAVENOUS

## 2017-08-05 MED ORDER — MIDAZOLAM HCL 5 MG/5ML IJ SOLN
2.0000 mg | Freq: Once | INTRAMUSCULAR | Status: AC
  Administered 2017-08-05: 2 mg via INTRAVENOUS

## 2017-08-05 MED ORDER — METHOCARBAMOL 1000 MG/10ML IJ SOLN
500.0000 mg | Freq: Four times a day (QID) | INTRAVENOUS | Status: DC | PRN
  Filled 2017-08-05: qty 5

## 2017-08-05 MED ORDER — VITAMIN C 250 MG PO TABS: 250.0000 mg | ORAL_TABLET | Freq: Every day | ORAL | Status: DC

## 2017-08-05 MED ORDER — NEOMYCIN-POLYMYXIN B GU 40-200000 IR SOLN
Status: DC | PRN
  Administered 2017-08-05: 4 mL

## 2017-08-05 MED ORDER — DEXAMETHASONE SODIUM PHOSPHATE 10 MG/ML IJ SOLN
INTRAMUSCULAR | Status: AC
  Filled 2017-08-05: qty 1

## 2017-08-05 MED ORDER — PROPOFOL 10 MG/ML IV BOLUS
INTRAVENOUS | Status: DC | PRN
  Administered 2017-08-05: 100 mg via INTRAVENOUS
  Administered 2017-08-05: 10 mg via INTRAVENOUS

## 2017-08-05 MED ORDER — VITAMIN C 100 MG PO TABS: 100.0000 mg | ORAL_TABLET | Freq: Every morning | ORAL | Status: DC

## 2017-08-05 MED ORDER — FENTANYL CITRATE (PF) 100 MCG/2ML IJ SOLN
INTRAMUSCULAR | Status: AC
  Administered 2017-08-05: 50 ug via INTRAVENOUS
  Filled 2017-08-05: qty 2

## 2017-08-05 MED ORDER — ACETAMINOPHEN 650 MG RE SUPP
650.0000 mg | RECTAL | Status: DC | PRN
Start: 2017-08-05 — End: 2017-08-07

## 2017-08-05 MED ORDER — CEFAZOLIN SODIUM-DEXTROSE 2-4 GM/100ML-% IV SOLN
2.0000 g | INTRAVENOUS | Status: AC
  Administered 2017-08-05: 2 g via INTRAVENOUS
  Filled 2017-08-05: qty 100

## 2017-08-05 MED ORDER — ENOXAPARIN SODIUM 30 MG/0.3ML ~~LOC~~ SOLN
30.0000 mg | SUBCUTANEOUS | Status: DC
  Administered 2017-08-06 – 2017-08-07 (×2): 30 mg via SUBCUTANEOUS
  Filled 2017-08-05 (×2): qty 0.3

## 2017-08-05 MED ORDER — ONDANSETRON HCL 4 MG/2ML IJ SOLN
INTRAMUSCULAR | Status: DC | PRN
  Administered 2017-08-05: 4 mg via INTRAVENOUS

## 2017-08-05 MED ORDER — ACETAMINOPHEN 325 MG PO TABS: 650.0000 mg | ORAL_TABLET | Freq: Four times a day (QID) | ORAL | Status: DC | PRN

## 2017-08-05 MED ORDER — MAGNESIUM HYDROXIDE 400 MG/5ML PO SUSP
30.0000 mL | Freq: Every day | ORAL | Status: DC | PRN
Start: 2017-08-05 — End: 2017-08-07

## 2017-08-05 MED ORDER — MORPHINE SULFATE (PF) 2 MG/ML IV SOLN
1.0000 mg | INTRAVENOUS | Status: DC | PRN
  Administered 2017-08-05: 1 mg via INTRAVENOUS
  Filled 2017-08-05: qty 1

## 2017-08-05 SURGICAL SUPPLY — 42 items
BIT DRILL JC 3.5X95 (BIT) ×2 IMPLANT
BLADE SURG SZ10 CARB STEEL (BLADE) ×2 IMPLANT
BNDG COHESIVE 4X5 TAN STRL (GAUZE/BANDAGES/DRESSINGS) ×2 IMPLANT
BNDG ESMARK 6X12 TAN STRL LF (GAUZE/BANDAGES/DRESSINGS) ×2 IMPLANT
CANISTER SUCT 1200ML W/VALVE (MISCELLANEOUS) ×2 IMPLANT
CHLORAPREP W/TINT 26ML (MISCELLANEOUS) ×2 IMPLANT
CUFF TOURN 24 STER (MISCELLANEOUS) IMPLANT
CUFF TOURN 30 STER DUAL PORT (MISCELLANEOUS) IMPLANT
DRAPE FLUOR MINI C-ARM 54X84 (DRAPES) ×2 IMPLANT
ELECT REM PT RETURN 9FT ADLT (ELECTROSURGICAL) ×2
ELECTRODE REM PT RTRN 9FT ADLT (ELECTROSURGICAL) ×1 IMPLANT
GAUZE PETRO XEROFOAM 1X8 (MISCELLANEOUS) ×2 IMPLANT
GAUZE SPONGE 4X4 12PLY STRL (GAUZE/BANDAGES/DRESSINGS) ×2 IMPLANT
GLOVE SURG ORTHO 8.0 STRL STRW (GLOVE) ×2 IMPLANT
GOWN STRL REUS W/ TWL LRG LVL3 (GOWN DISPOSABLE) ×1 IMPLANT
GOWN STRL REUS W/TWL LRG LVL3 (GOWN DISPOSABLE) ×1
GOWN STRL REUS W/TWL LRG LVL4 (GOWN DISPOSABLE) ×2 IMPLANT
HANDLE YANKAUER SUCT BULB TIP (MISCELLANEOUS) ×2 IMPLANT
K-WIRE 1.1 (WIRE) ×4 IMPLANT
KIT RM TURNOVER STRD PROC AR (KITS) ×2 IMPLANT
LABEL OR SOLS (LABEL) ×2 IMPLANT
NS IRRIG 1000ML POUR BTL (IV SOLUTION) ×2 IMPLANT
PACK EXTREMITY ARMC (MISCELLANEOUS) ×2 IMPLANT
PAD PREP 24X41 OB/GYN DISP (PERSONAL CARE ITEMS) ×2 IMPLANT
PLATE LCP 3.5 1/3 TUB 7HX81 (Plate) ×2 IMPLANT
SCREW CANC FT 4.0X20 (Screw) ×4 IMPLANT
SCREW CANN L THRD/50 4.0 (Screw) ×4 IMPLANT
SCREW CORTEX 3.5 14MM (Screw) ×3 IMPLANT
SCREW CORTEX 3.5 18MM (Screw) ×2 IMPLANT
SCREW CORTEX 3.5 20MM (Screw) ×1 IMPLANT
SCREW LOCK CORT ST 3.5X14 (Screw) ×3 IMPLANT
SCREW LOCK CORT ST 3.5X18 (Screw) ×2 IMPLANT
SCREW LOCK CORT ST 3.5X20 (Screw) ×1 IMPLANT
SPLINT CAST 1 STEP 5X30 WHT (MISCELLANEOUS) ×2 IMPLANT
SPONGE LAP 18X18 5 PK (GAUZE/BANDAGES/DRESSINGS) ×2 IMPLANT
STAPLER SKIN PROX 35W (STAPLE) ×2 IMPLANT
STOCKINETTE BIAS CUT 6 980064 (GAUZE/BANDAGES/DRESSINGS) ×2 IMPLANT
STOCKINETTE STRL 6IN 960660 (GAUZE/BANDAGES/DRESSINGS) ×2 IMPLANT
SUT VIC AB 2-0 CT1 27 (SUTURE) ×1
SUT VIC AB 2-0 CT1 TAPERPNT 27 (SUTURE) ×1 IMPLANT
SUT VIC AB 3-0 SH 27 (SUTURE) ×1
SUT VIC AB 3-0 SH 27X BRD (SUTURE) ×1 IMPLANT

## 2017-08-05 NOTE — H&P (Signed)
[place] [facility] [place] NAME: [name]    MR#:  [id number]  DATE OF BIRTH:  20-Sep-1940  DATE OF ADMISSION:  08/05/2017  PRIMARY CARE PHYSICIAN: [name], MD   REQUESTING/REFERRING PHYSICIAN: Earleen Newport, MD  CHIEF COMPLAINT:   Ankle pain HISTORY OF PRESENT ILLNESS:  [name]  is a 77 y.o. female with a known history of metastatic breast cancer, arthritis, chronic Alzheimer's dementia and hyperlipidemia is brought into the ED by her husband after she sustained a fall.  Patient's husband is a retired Chief Strategy Officer.  He states he found her after a fall in the elevator patient was complaining of right hip pain and x-rays revealed right ankle better bimalleolar fracture.  ED physician has discussed with Dr. Sabra Heck who has scheduled to do surgery today and requested hospitalist team to admit the patient.  Patient is resting comfortably during my examination.  Denies any chest pain or shortness of breath  PAST MEDICAL HISTORY:   Past Medical History:  Diagnosis Date  . Arthritis   . 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 Breast cancer ([place]) 2016  . Breast cancer of upper-inner quadrant of left female breast ([place]) 12/15/14   Completed radiation end of December and finished chemotherapy 2 weeks ago, Left breast invasive mammary carcinoma, T1cN79mc (1.5 cm); Grade 3, [name], [name], [name], .  .Marland KitchenCataract    bilat   . 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 Hearing loss   . History of cancer chemotherapy   . History of radiation therapy   . Imbalance   . Memory impairment    seen by Dr SManuella Ghazi possible post crainiotomy from radiation  . Numbness and tingling    right hand   . Shoulder pain, right   . Wears glasses     PAST SURGICAL HISTOIRY:   Past Surgical History:  Procedure Laterality Date  . APPENDECTOMY  1950  . BRAIN SURGERY  1995   left  frontal/temporal  . BREAST BIOPSY Left 1997  . BREAST BIOPSY Left 12/15/14   confirmed DCIS  . BREAST BIOPSY Left 01/08/2015   Procedure: BREAST BIOPSY WITH NEEDLE LOCALIZATION;  Surgeon: [name] MD;  Location: [facility] ORS;  Service: General;  Laterality: Left;  . BREAST LUMPECTOMY Left 01/08/2015   Procedure: LUMPECTOMY;  Surgeon: [name] MD;  Location: [facility] ORS;  Service: General;  Laterality: Left;  . COLONOSCOPY  2010   Dr. ETiffany Kocher . PORTACATH PLACEMENT Right 01/16/2015   Procedure: INSERTION PORT-A-CATH;  Surgeon: [name] MD;  Location: [facility] ORS;  Service: General;  Laterality: Right;  . SENTINEL NODE BIOPSY Left 01/16/2015   Procedure: SENTINEL NODE BIOPSY;  Surgeon: [name] MD;  Location: [facility] ORS;  Service: General;  Laterality: Left;  . TOTAL HIP ARTHROPLASTY Right 09/04/2015   Procedure: RIGHT TOTAL HIP ARTHROPLASTY ANTERIOR APPROACH;  Surgeon: MParalee Cancel MD;  Location: [facility];  Service: Orthopedics;  Laterality: Right;    SOCIAL HISTORY:   Social History   Tobacco Use  . Smoking status: Former Smoker    Packs/day: 0.25    Years: 5.00    Pack years: 1.25    Types: Cigarettes    Last attempt to quit: 07/28/1972    Years since quitting: 45.0  . Smokeless tobacco: Never Used  Substance Use Topics  . Alcohol  use: Yes    Alcohol/week: 0.6 - 1.2 oz    Types: 1 - 2 Glasses of wine per week    Comment: 1 Glass Wine / Night    FAMILY HISTORY:   Family History  Problem Relation Age of Onset  . Breast cancer Sister 26  . Addison's disease Mother   . Stroke Father     DRUG ALLERGIES:   Allergies  Allergen Reactions  . Donepezil     Other reaction(s): Other (See Comments) Nightmares    REVIEW OF SYSTEMS:  CONSTITUTIONAL: No fever, fatigue or weakness.  EYES: No blurred or double vision.  EARS, NOSE, AND THROAT: No tinnitus or ear pain.  RESPIRATORY: No cough, shortness of breath, wheezing or hemoptysis.  CARDIOVASCULAR: No  chest pain, orthopnea, edema.  GASTROINTESTINAL: No nausea, vomiting, diarrhea or abdominal pain.  GENITOURINARY: No dysuria, hematuria.  ENDOCRINE: No polyuria, nocturia,  HEMATOLOGY: No anemia, easy bruising or bleeding SKIN: No rash or lesion. MUSCULOSKELETAL: Reporting right ankle pain  nEUROLOGIC: No tingling, numbness, weakness.  PSYCHIATRY: No anxiety or depression.   MEDICATIONS AT HOME:   Prior to Admission medications   Medication Sig Start Date End Date Taking? Authorizing Provider  Ascorbic Acid (VITAMIN C) 100 MG tablet Take 100 mg by mouth every morning.    Yes [provider]  Calcium Carbonate-Vit D-Min (CALTRATE 600+D PLUS MINERALS) 600-800 MG-UNIT TABS Take 1 tablet by mouth every morning.   Yes [provider]  ferrous sulfate 325 (65 FE) MG tablet Take 1 tablet (325 mg total) by mouth 3 (three) times daily after meals. 09/05/15  Yes Babish, Rodman Key, PA-C  loratadine (CLARITIN) 10 MG tablet Take 10 mg by mouth daily as needed.    Yes [provider]  vitamin E 400 UNIT capsule Take 400 Units by mouth daily.    Yes [provider]  lidocaine-prilocaine (EMLA) cream Apply 1 application topically as needed. 01/23/15   [name], MD  naproxen sodium (ALEVE) 220 MG tablet Take 220 mg daily as needed by mouth (pain).    [provider]  ondansetron (ZOFRAN) 8 MG tablet Take 1 tablet (8 mg total) every 8 (eight) hours as needed by mouth for nausea or vomiting (start 3 days; after chemo). Patient not taking: Reported on 07/22/2017 06/04/17   Cammie Sickle, MD  prochlorperazine (COMPAZINE) 10 MG tablet Take 1 tablet (10 mg total) every 6 (six) hours as needed by mouth for nausea or vomiting. Patient not taking: Reported on 07/22/2017 06/04/17   Cammie Sickle, MD  traMADol (ULTRAM) 50 MG tablet Take 50 mg by mouth as needed.  10/24/15   [provider]      VITAL SIGNS:  Blood pressure 112/61, pulse 73,  temperature 98.2 F (36.8 C), temperature source Axillary, resp. rate 12, height 5' 3"  (1.6 m), weight 74.8 kg (165 lb), SpO2 100 %.  PHYSICAL EXAMINATION:  GENERAL:  77 y.o.-year-old patient lying in the bed with no acute distress.  EYES: Pupils equal, round, reactive to light and accommodation. No scleral icterus. Extraocular muscles intact.  HEENT: Head atraumatic, normocephalic. Oropharynx and nasopharynx clear.  NECK:  Supple, no jugular venous distention. No thyroid enlargement, no tenderness.  LUNGS: Normal breath sounds bilaterally, no wheezing, rales,rhonchi or crepitation. No use of accessory muscles of respiration.  CARDIOVASCULAR: S1, S2 normal. No murmurs, rubs, or gallops.  ABDOMEN: Soft, nontender, nondistended. Bowel sounds present. No organomegaly or mass.  EXTREMITIES: Right ankle area is tender, no pedal edema,  cyanosis, or clubbing.  NEUROLOGIC: Cranial nerves II through XII are intact. Muscle strength at her baseline in all extremities except right ankle area . Sensation intact. Gait not checked.  PSYCHIATRIC: The patient is alert and oriented x 2-3.  SKIN: No obvious rash, lesion, or ulcer.   LABORATORY PANEL:   CBC Recent Labs  Lab 08/05/17 0923  WBC 6.3  HGB 12.5  HCT 37.4  PLT 360   ------------------------------------------------------------------------------------------------------------------  Chemistries  Recent Labs  Lab 08/05/17 0923  NA 135  K 3.5  CL 100*  CO2 26  GLUCOSE 122*  BUN 15  CREATININE 0.86  CALCIUM 8.8*  AST 25  ALT 24  ALKPHOS 55  BILITOT 0.6   ------------------------------------------------------------------------------------------------------------------  Cardiac Enzymes Recent Labs  Lab 08/05/17 0923  TROPONINI <0.03   ------------------------------------------------------------------------------------------------------------------  RADIOLOGY:  Dg Lumbar Spine 2-3 Views  Result Date: 08/05/2017 CLINICAL DATA:   [place] at home in the elevator.  Back pain. 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 No evidence of fracture or other focal lesion. IMPRESSION: Chronic lumbar degenerative changes.  No acute or traumatic finding. Electronically Signed   By: Nelson Chimes M.D.   On: 08/05/2017 10:23   Dg Ankle Complete Right  Result Date: 08/05/2017 CLINICAL DATA:  Continued surveillance of ankle deformity. EXAM: RIGHT ANKLE - COMPLETE 3+ VIEW COMPARISON:  None available. FINDINGS: No images were obtained before placement of the plaster cast. There is a comminuted and displaced distal fibular/lateral malleolar fracture. Transverse and displaced medial malleolar fracture. Widening of the ankle mortise. Plantar spur. IMPRESSION: Bimalleolar fracture, closed reduction. Electronically Signed   By: Staci Righter M.D.   On: 08/05/2017 10:19   Dg Chest Port 1 View  Result Date: 08/05/2017 CLINICAL DATA:  Shortness of breath, former smoker. History of metastatic breast malignancy, has undergone radiation therapy and chemotherapy. EXAM: PORTABLE CHEST 1 VIEW COMPARISON:  Chest x-ray of June 22, 2017 and chest CT scan of July 31, 2017 FINDINGS: The lungs are adequately inflated and clear. The heart and pulmonary vascularity are normal. The mediastinum is normal in width. The power port catheter tip projects over the distal third of the SVC. The bony thorax exhibits no acute abnormality. IMPRESSION: There is no acute cardiopulmonary abnormality. Electronically Signed   By: David  Martinique M.D.   On: 08/05/2017 12:14   Dg Hip Unilat W Or Wo Pelvis 2-3 Views Right  Result Date: 08/05/2017 CLINICAL DATA:  [place] at home in the elevator.  Hip pain. EXAM: DG HIP (WITH OR WITHOUT PELVIS) 2-3V RIGHT COMPARISON:  09/04/2015 FINDINGS: Previous total hip replacement on the right. Components appear well  positioned. No sign of fracture or dislocation. No other right pelvic injury seen. Osteoarthritis of the left hip. IMPRESSION: No traumatic finding.  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 Electronically Signed   By: Nelson Chimes M.D.   On: 08/05/2017 10:17    EKG:   Orders placed or performed during the hospital encounter of 08/05/17  . ED EKG  . ED EKG    IMPRESSION AND PLAN:  [name]  is a 77 y.o. female with a known history of metastatic breast cancer, arthritis, chronic Alzheimer's dementia and hyperlipidemia is brought into the ED by her husband after she sustained a fall.  Patient's husband is a retired Chief Strategy Officer.  He states he found her after a fall in the elevator patient was complaining  of right hip pain and x-rays revealed right ankle better bimalleolar fracture.   #Right ankle bimalleolar fracture closed Admit to MedSurg unit N.p.o., IV fluids Scheduled for surgery today by Dr. Sabra Heck Pain management as needed  medically optimized for noncardiac surgery  #History of metastatic breast cancer On chemotherapy Outpatient follow-up with Dr. Yevette Edwards in 2 weeks as per his note  #Hyperlipidemia Not on any statins check fasting lipid panel  #Chronic history of Alzheimer's dementia Continue close monitoring.  Not on any home medications   DVT prophylaxis with SCDs  All the records are reviewed and case discussed with ED provider. Management plans discussed with the patient, family and they are in agreement.  CODE STATUS: FC,husband Dr.Wiltse is HCPOA  TOTAL TIME TAKING CARE OF THIS PATIENT: 43  minutes.   Note: This dictation was prepared with Dragon dictation along with smaller phrase technology. Any transcriptional errors that result from this process are unintentional.  Nicholes Mango M.D on 08/05/2017 at 1:48 PM  Between 7am to 6pm - Pager - [phone]  After 6pm go to [email or web address] - password [email or web address] [facility]  [facility]  Office   [phone]  CC: Primary care physician; [name], MD

## 2017-08-05 NOTE — Consult Note (Signed)
ORTHOPAEDIC CONSULTATION  REQUESTING PHYSICIAN: Nicholes Mango, MD  Chief Complaint: Right ankle pain  HPI: [name] is a 77 y.o. female who complains of right ankle pain following a fall at home this morning.  The patient became weak in her home elevator and her husband found her on the floor with a severely displaced right ankle fracture.  She was brought to the emergency room where exam and x-rays revealed a displaced bimalleolar fracture dislocation of the right ankle.  This was reduced by Dr. Jimmye Norman in the emergency room and splinted.  Nevertheless x-rays still show subluxation and just some displacement of the fractures.  Plan to the patient and her husband Dr. Tobe Sos the unstable nature of this fracture and need for operative stabilization if at all possible.  The risks and benefits infection DVT or other medical complications were discussed with them along with postop protocol.  They wish to proceed with surgery later today.  Followed by Dr. Rogue Bussing at the oncology center for metastatic breast cancer.  Past Medical History:  Diagnosis Date  . Arthritis   . 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 Breast cancer ([place]) 2016  . Breast cancer of upper-inner quadrant of left female breast ([place]) 12/15/14   Completed radiation end of December and finished chemotherapy 2 weeks ago, Left breast invasive mammary carcinoma, T1cN93mc (1.5 cm); Grade 3, [name], [name], [name], .  .Marland KitchenCataract    bilat   . 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 Hearing loss   . History of cancer chemotherapy   . History of radiation therapy   . Imbalance   . Memory impairment    seen by Dr SManuella Ghazi possible post crainiotomy from radiation  . Numbness and tingling    right hand   . Shoulder pain, right   . Wears glasses    Past Surgical History:  Procedure Laterality Date  . APPENDECTOMY  1950  . BRAIN SURGERY  1995   left frontal/temporal  . BREAST  BIOPSY Left 1997  . BREAST BIOPSY Left 12/15/14   confirmed DCIS  . BREAST BIOPSY Left 01/08/2015   Procedure: BREAST BIOPSY WITH NEEDLE LOCALIZATION;  Surgeon: [name] MD;  Location: [facility] OR[facility];  Service: General;  Laterality: Left;  . BREAST LUMPECTOMY Left 01/08/2015   Procedure: LUMPECTOMY;  Surgeon: [name] MD;  Location: [facility] OR[facility];  Service: General;  Laterality: Left;  . COLONOSCOPY  2010   Dr. ETiffany Kocher . PORTACATH PLACEMENT Right 01/16/2015   Procedure: INSERTION PORT-A-CATH;  Surgeon: [name] MD;  Location: [facility];  Service: General;  Laterality: Right;  . SENTINEL NODE BIOPSY Left 01/16/2015   Procedure: SENTINEL NODE BIOPSY;  Surgeon: [name] MD;  Location: [facility];  Service: General;  Laterality: Left;  . TOTAL HIP ARTHROPLASTY Right 09/04/2015   Procedure: RIGHT TOTAL HIP ARTHROPLASTY ANTERIOR APPROACH;  Surgeon: MParalee Cancel MD;  Location: [facility];  Service: Orthopedics;  Laterality: Right;   Social History   Socioeconomic History  . Marital status: Married    Spouse name: None  . Number of children: None  . Years of education: None  . Highest education level: None  Social Needs  . Financial resource strain: None  . Food insecurity - worry: None  . Food insecurity - inability: None  . Transportation needs - medical: None  . Transportation needs - non-medical: None  Occupational History  . None  Tobacco Use  . Smoking status: Former Smoker    Packs/day: 0.25    Years: 5.00    Pack years: 1.25    Types: Cigarettes    Last attempt to quit: 07/28/1972    Years since quitting: 45.0  . Smokeless tobacco: Never Used  Substance and Sexual Activity  . Alcohol use: Yes    Alcohol/week: 0.6 - 1.2 oz    Types: 1 - 2 Glasses of wine per week    Comment: 1 Glass Wine / Night  . Drug use: No  . Sexual activity: None  Other Topics Concern  . None  Social History Narrative  . None   Family History  Problem Relation Age of Onset  .  Breast cancer Sister 66  . Addison's disease Mother   . Stroke Father    Allergies  Allergen Reactions  . Donepezil     Other reaction(s): Other (See Comments) Nightmares   Prior to Admission medications   Medication Sig Start Date End Date Taking? Authorizing Provider  Ascorbic Acid (VITAMIN C) 100 MG tablet Take 100 mg by mouth every morning.    Yes [provider]  Calcium Carbonate-Vit D-Min (CALTRATE 600+D PLUS MINERALS) 600-800 MG-UNIT TABS Take 1 tablet by mouth every morning.   Yes [provider]  ferrous sulfate 325 (65 FE) MG tablet Take 1 tablet (325 mg total) by mouth 3 (three) times daily after meals. 09/05/15  Yes Babish, Rodman Key, PA-C  loratadine (CLARITIN) 10 MG tablet Take 10 mg by mouth daily as needed.    Yes [provider]  vitamin E 400 UNIT capsule Take 400 Units by mouth daily.    Yes [provider]  lidocaine-prilocaine (EMLA) cream Apply 1 application topically as needed. 01/23/15   [name], MD  naproxen sodium (ALEVE) 220 MG tablet Take 220 mg daily as needed by mouth (pain).    [provider]  ondansetron (ZOFRAN) 8 MG tablet Take 1 tablet (8 mg total) every 8 (eight) hours as needed by mouth for nausea or vomiting (start 3 days; after chemo). Patient not taking: Reported on 07/22/2017 06/04/17   Cammie Sickle, MD  prochlorperazine (COMPAZINE) 10 MG tablet Take 1 tablet (10 mg total) every 6 (six) hours as needed by mouth for nausea or vomiting. Patient not taking: Reported on 07/22/2017 06/04/17   Cammie Sickle, MD  traMADol (ULTRAM) 50 MG tablet Take 50 mg by mouth as needed.  10/24/15   [provider]   Dg Lumbar Spine 2-3 Views  Result Date: 08/05/2017 CLINICAL DATA:  [place] at home in the elevator.  Back pain. 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 No  evidence of fracture or other focal lesion. IMPRESSION: Chronic lumbar degenerative changes.  No acute or traumatic finding. Electronically Signed   By: Nelson Chimes M.D.   On: 08/05/2017 10:23   Dg Ankle Complete Right  Result Date: 08/05/2017 CLINICAL DATA:  Continued surveillance of ankle deformity. EXAM: RIGHT ANKLE - COMPLETE 3+ VIEW COMPARISON:  None available. FINDINGS: No images were obtained before placement of the plaster cast. There is a comminuted and displaced distal fibular/lateral malleolar fracture. Transverse and displaced medial malleolar fracture. Widening of the ankle mortise. Plantar spur. IMPRESSION: Bimalleolar fracture, closed reduction. Electronically Signed   By: Staci Righter M.D.   On: 08/05/2017 10:19  Dg Chest Port 1 View  Result Date: 08/05/2017 CLINICAL DATA:  Shortness of breath, former smoker. History of metastatic breast malignancy, has undergone radiation therapy and chemotherapy. EXAM: PORTABLE CHEST 1 VIEW COMPARISON:  Chest x-ray of June 22, 2017 and chest CT scan of July 31, 2017 FINDINGS: The lungs are adequately inflated and clear. The heart and pulmonary vascularity are normal. The mediastinum is normal in width. The power port catheter tip projects over the distal third of the SVC. The bony thorax exhibits no acute abnormality. IMPRESSION: There is no acute cardiopulmonary abnormality. Electronically Signed   By: David  Martinique M.D.   On: 08/05/2017 12:14   Dg Hip Unilat W Or Wo Pelvis 2-3 Views Right  Result Date: 08/05/2017 CLINICAL DATA:  [place] at home in the elevator.  Hip pain. EXAM: DG HIP (WITH OR WITHOUT PELVIS) 2-3V RIGHT COMPARISON:  09/04/2015 FINDINGS: Previous total hip replacement on the right. Components appear well positioned. No sign of fracture or dislocation. No other right pelvic injury seen. Osteoarthritis of the left hip. IMPRESSION: No traumatic finding.  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 Electronically Signed   By: Nelson Chimes  M.D.   On: 08/05/2017 10:17    Positive ROS: All other systems have been reviewed and were otherwise negative with the exception of those mentioned in the HPI and as above.  Physical Exam: General: Alert, no acute distress Cardiovascular: No pedal edema Respiratory: No cyanosis, no use of accessory musculature GI: No organomegaly, abdomen is soft and non-tender Skin: No lesions in the area of chief complaint Neurologic: Sensation intact distally Psychiatric: Patient is competent for consent with normal mood and affect Lymphatic: No axillary or cervical lymphadenopathy  MUSCULOSKELETAL: Patient's lying quietly and alert is alert and cooperative.  Right ankle is splinted.  Ace bandages were removed and the cut was splint was cut and was split to make sure there was no constriction.  The neurovascular status to be distally and the skin was intact.  There is very little swelling.  Assessment: Bimalleolar fracture dislocation right ankle  Plan: Operative fixation of the right ankle later today.    [name], MD [phone]   08/05/2017 12:31 PM

## 2017-08-05 NOTE — Progress Notes (Deleted)
[place] OFFICE PROGRESS NOTE  Patient Care Team: [name], [name], MD (General Surgery) Requested, Self  Breast cancer metastasized to axillary lymph node [facility])   Staging form: Breast, [name], [name], [name], MD on 01/29/2015    Oncology History   # June 2016- LEFT BREAST CA;  [name] ; [name] ; [name]; Her 2 Neu POS; [place] from July OF 2016; [name]; adjuvant Herceptin [ Finished July 2017]; AUG 2017- Neratinib x5 days; DISCON sec to diarrhea  # MID OCT 2018- Right breast mass-Bx- ER/PR-NEG; Her 2 NEU POSITIVE 1~2.5cm;  [?NEW primary]  # MID-OCT 2018-METASTATIC RECURRENT-oh sternal mass; Left Ax LN [Bx]/periportal LN  # OCT 25th 2018- TAXOL-HERCEPTIN-PERJETA   -------------------------------------------------------------------------------   # MUGA scan- July 28th 2017- 67%.  # chronic gait/balance issues  # June 2017- left breast Bx- fat necrosis [[name]  # july 2017-  BRCA 1& 2- NEG.   MOLECULAR TESTING- F ONE- TPS- 0%; pending; ERB2 amplification; PI3K/RET amplification Others**     Carcinoma of overlapping sites of left breast in female, estrogen receptor negative ([place])    INTERVAL HISTORY: Patient a poor historian given mild dementia.   [name] 77 y.o.  female pleasant patient above history of  recurrent/metastatic breast cancer ER/[name] HER-2/neu positive currently status post cycle #3 day-8 of Taxol Herceptin perjeta is here for follow-up.  As per husband patient noticing to have right-sided hip pain for the last 1 week.  6 on a scale of 10.  No significant radiation.  Has been taking tramadol.  No falls.  No trauma.  Mild diarrhea.  Denies any constipation. No skin rash. No fever chills. No tingling,  numbness-continues to have "restless legs".  Cough resolved.  Intermittent episodes of epistaxis.   REVIEW OF SYSTEMS:  A complete 10 point review of system is done which is negative except mentioned above/history of present illness.   PAST MEDICAL HISTORY :  Past Medical History:  Diagnosis Date  . Arthritis   . 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 Breast cancer ([place]) 2016  . 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  .Marland KitchenCataract    bilat   . 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 Hearing loss   . History of cancer chemotherapy   . History of radiation therapy   . Imbalance   . Memory impairment    seen by Dr SManuella Ghazi possible post crainiotomy from radiation  . Numbness and tingling    right hand   . Shoulder pain, right   . Wears glasses     PAST SURGICAL HISTORY :   Past Surgical History:  Procedure Laterality Date  . APPENDECTOMY  1950  . BRAIN SURGERY  1995   left frontal/temporal  . BREAST BIOPSY Left 1997  . BREAST BIOPSY Left 12/15/14   confirmed DCIS  . BREAST BIOPSY Left 01/08/2015   Procedure: BREAST BIOPSY WITH NEEDLE LOCALIZATION;  Surgeon: [name] MD;  Location: [facility] ORS;  Service: General;  Laterality: Left;  . BREAST LUMPECTOMY Left 01/08/2015   Procedure: LUMPECTOMY;  Surgeon: [name], MD;  Location: [facility] ORS;  Service: General;  Laterality: Left;  . COLONOSCOPY  2010   Dr. [name]  . PORTACATH PLACEMENT Right 01/16/2015   Procedure: INSERTION PORT-A-CATH;  Surgeon: [name], MD;  Location: [facility] ORS;  Service: General;  Laterality: Right;  . SENTINEL NODE BIOPSY Left 01/16/2015   Procedure: SENTINEL NODE BIOPSY;  Surgeon: [name], MD;  Location: [facility] ORS;  Service: General;   Laterality: Left;  . TOTAL HIP ARTHROPLASTY Right 09/04/2015   Procedure: RIGHT TOTAL HIP ARTHROPLASTY ANTERIOR APPROACH;  Surgeon: [name], MD;  Location: [facility];  Service: Orthopedics;  Laterality: Right;    FAMILY HISTORY :   Family History  Problem Relation Age of Onset  . Breast cancer Sister 44  . Addison's disease Mother   . Stroke Father     SOCIAL HISTORY:   Social History   Tobacco Use  . Smoking status: Former Smoker    Packs/day: 0.25    Years: 5.00    Pack years: 1.25    Types: Cigarettes    Last attempt to quit: 07/28/1972    Years since quitting: 45.0  . Smokeless tobacco: Never Used  Substance Use Topics  . Alcohol use: Yes    Alcohol/week: 0.6 - 1.2 oz    Types: 1 - 2 Glasses of wine per week    Comment: 1 Glass Wine / Night  . Drug use: No    ALLERGIES:  is allergic to donepezil.  MEDICATIONS:  Current Outpatient Medications  Medication Sig Dispense Refill  . Ascorbic Acid (VITAMIN C) 100 MG tablet Take 100 mg by mouth every morning.     . Calcium Carbonate-Vit D-Min (CALTRATE 600+D PLUS MINERALS) 600-800 MG-UNIT TABS Take 1 tablet by mouth every morning.    . ferrous sulfate 325 (65 FE) MG tablet Take 1 tablet (325 mg total) by mouth 3 (three) times daily after meals.  3  . lidocaine-prilocaine (EMLA) cream Apply 1 application topically as needed. 30 g 3  . loratadine (CLARITIN) 10 MG tablet Take 10 mg by mouth daily as needed.     . mirabegron ER (MYRBETRIQ) 50 MG TB24 tablet Take 50 mg by mouth daily.    . naproxen sodium (ALEVE) 220 MG tablet Take 220 mg daily as needed by mouth (pain).    . ondansetron (ZOFRAN) 8 MG tablet Take 1 tablet (8 mg total) every 8 (eight) hours as needed by mouth for nausea or vomiting (start 3 days; after chemo). (Patient not taking: Reported on 07/22/2017) 40 tablet 1  . polyvinyl alcohol (LIQUIFILM TEARS) 1.4 % ophthalmic solution Place 1 drop into both eyes as needed for dry eyes.     Marland Kitchen prochlorperazine (COMPAZINE)  10 MG tablet Take 1 tablet (10 mg total) every 6 (six) hours as needed by mouth for nausea or vomiting. (Patient not taking: Reported on 07/22/2017) 40 tablet 1  . traMADol (ULTRAM) 50 MG tablet Take 50 mg by mouth as needed.     . vitamin E 400 UNIT capsule Take 400 Units by mouth daily.      No current facility-administered medications for this visit.     PHYSICAL EXAMINATION: ECOG PERFORMANCE STATUS: 1 - Symptomatic but completely ambulatory  There were no vitals taken for this visit.  There were no vitals filed for this visit.  GENERAL: Well-nourished well-developed; Alert,  no distress and comfortable.  With her husband.  Walks with a cane. EYES: no pallor or icterus OROPHARYNX: no thrush or ulceration; good dentition  NECK: supple, no masses felt LYMPH:  palpable lymphadenopathy Left supraclavicular region ; left cervical region. Adenopathy felt in the left axillary/improved. LUNGS: clear to auscultation and  No wheeze or crackles HEART/CVS: regular rate & rhythm and no murmurs; No lower extremity edema ABDOMEN:abdomen soft, non-tender and normal bowel sounds Musculoskeletal:no cyanosis of digits and no clubbing; 1 cm raised lesion noted in the sternal area/improved. PSYCH: alert & oriented x 3 with fluent speech NEURO: no focal motor/sensory deficits SKIN:  no rashes or significant lesions; folliculitis noted left hand.  LABORATORY DATA:  I have reviewed the data as listed    Component Value Date/Time   NA 135 07/22/2017 1124   K 4.0 07/22/2017 1124   CL 97 (L) 07/22/2017 1124   CO2 30 07/22/2017 1124   GLUCOSE 118 (H) 07/22/2017 1124   BUN 16 07/22/2017 1124   CREATININE 0.63 07/22/2017 1124   CALCIUM 8.7 (L) 07/22/2017 1124   PROT 6.5 07/22/2017 1124   ALBUMIN 4.0 07/22/2017 1124   AST 25 07/22/2017 1124   ALT 27 07/22/2017 1124   ALKPHOS 53 07/22/2017 1124   BILITOT 0.5 07/22/2017 1124   GFRNONAA >60 07/22/2017 1124   GFRAA >60 07/22/2017 1124    No results  found for: SPEP, UPEP  Lab Results  Component Value Date   WBC 3.8 07/22/2017   NEUTROABS 2.7 07/22/2017   HGB 12.2 07/22/2017   HCT 36.9 07/22/2017   MCV 92.8 07/22/2017   PLT 335 07/22/2017      Chemistry      Component Value Date/Time   NA 135 07/22/2017 1124   K 4.0 07/22/2017 1124   CL 97 (L) 07/22/2017 1124   CO2 30 07/22/2017 1124   BUN 16 07/22/2017 1124   CREATININE 0.63 07/22/2017 1124      Component Value Date/Time   CALCIUM 8.7 (L) 07/22/2017 1124   ALKPHOS 53 07/22/2017 1124   AST 25 07/22/2017 1124   ALT 27 07/22/2017 1124   BILITOT 0.5 07/22/2017 1124     No  focal wall motion abnormality of the left ventricle. Calculated left ventricular ejection fraction equals 67%. Previously 61% on 11/13/2015 IMPRESSION: Left ventricular ejection fraction equals 67 %. Electronically Signed   By: Suzy Bouchard M.D.   On: 02/20/2016 15:02  RADIOGRAPHIC STUDIES: I have personally reviewed the radiological images as listed and agreed with the findings in the report. No results found.  IMPRESSION: 1. Hypermetabolic recurrent/metastatic disease within the neck, chest, abdomen as detailed above. 2.  Aortic Atherosclerosis (ICD10-I70.0). 3. Hepatic steatosis. 4. Uterine fibroids. 5. 3.8 cm simple appearing right adnexal/ovarian lesion should be considered for nonemergent pelvic ultrasound.   Electronically Signed   By: Abigail Miyamoto M.D.   On: 05/13/2017 09:46 ASSESSMENT & PLAN:  No problem-specific Assessment & Plan notes found for this encounter.  No orders of the defined types were placed in this encounter.  All questions were answered. The patient knows to call the clinic with any problems, questions or concerns.      Cammie Sickle, MD 08/05/2017 8:19 AM

## 2017-08-05 NOTE — Care Management Obs Status (Signed)
[place] NOTIFICATION   Patient Details  Name: [name] MRN: [id number] Date of Birth: [birth year]   Medicare Observation Status Notification Given:  Yes  Patient's husband Dr. Tobe Sos at bedside and received copy that patient signed with his permission.   Marshell Garfinkel, RN 08/05/2017, 1:37 PM

## 2017-08-05 NOTE — ED Notes (Signed)
Will wait for husband to return to sign consent due to meds received and some dementia.

## 2017-08-05 NOTE — Assessment & Plan Note (Deleted)
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 Tolerated chemotherapy well- except for mild-mod diarrhea.   # Proceed with cycle #3 day15Taxol-H-P.  Clinical response noted; will get scans after third/this cycle.   # UTI- s/p cipro; resolved; myrbectriq prn as per Dr.Wolfe.   # RIGHT hip pain-x 1 week; on tramadol 6/10 ; noi radiation; on tramadol 1-2 day; [Dr.Miller].  Continue tramadol for now.  Would await imaging as planned  # Dementia-/chronic debility/ataxia- STABLE.   # epistaxis- cont nasal spray; stable.   # [employer] One testing -no obvious targetable mutations.    # follow up-  CT chest/Ab-pelvis scan /MD/Labs-THP on jan 9th as planned.

## 2017-08-05 NOTE — Telephone Encounter (Signed)
Reviewed the results of the imaging with the patient's husband in detail.  Recommend follow-up in 2 weeksMD/ /labs-CBC BMP; herceptin-perjeta only.

## 2017-08-05 NOTE — Care Management (Addendum)
Patient is in ED bed 5 currently under inpatient status. Dr. Margaretmary Eddy paged after talking with [facility] to discuss admit status. Patient is followed at cancer center for metatstatic breast cancer and unfortunately presents with  Bimalleolar fracture dislocation right ankle. She does have Traditional Medicare that would not cover rehab under OBServation status.  This RNCM spoke with Dr. Margaretmary Eddy and she would like for patient to be in observation bed for now.

## 2017-08-05 NOTE — Op Note (Signed)
  08/05/2017  8:13 PM  PATIENT:  [name]  77 y.o. female  PRE-OPERATIVE DIAGNOSIS: Trimalleolar  Fracture dislocation right ankle  POST-OPERATIVE DIAGNOSIS:  Same  PROCEDURE:  OPEN REDUCTION INTERNAL FIXATION (ORIF) ANKLE FRACTURE MEDIAL/LATERAL   SURGEON:  Bryanah Sidell E Darsi Tien MD  ASST:    ANESTHESIA:   General  EBL:  MINIMAL  TOURNIQUET TIME:  76 MIN  OPERATIVE FINDINGS: Very unstable comminuted fracture dislocation right ankle.  Bone quality was very poor.  OPERATIVE PROCEDURE:   The patient was brought to the operating room and placed in the supine position. All bony prominences were padded. General anesthesia was administered. The lower extremity was prepped and draped in the usual sterile fashion. The leg was elevated and exsanguinated and the tourniquet was inflated. Time out was performed.   Incision was made over the distal fibula and the fracture was exposed and reduced anatomically with a clamp. A lag screw was placed. I then applied a 7-hole one third semitubular plate and secured it proximally with cortical screws and distally with cortical and cancellus screws.  I used the Fluoroscan to confirm satisfactory reduction and fixation. This wound was irrigated and closed with vicryl sutures and staples.  I then turned my attention to the medial malleolus. Incision was made over the medial malleolus and the fracture exposed and held provisionally with a clamp. 2 guidepins were placed for the 4.0 mm cannulated screws and then confirmation of reduction was made with fluoroscopy. I then placed 2  37mm screws which had satisfactory fixation. Fluoroscopy showed good reduction and hardware placement.   The syndesmosis was stressed using live fluoroscopy and found to be stable.   The medial and anterior wound was irrigated, and closed with vicryl and staples. Sponge and needle counts were correct. The wounds were injected with local anesthetic. Sterile gauze was applied followed by  a posterior splint. She was awakened and returned to the PACU in stable and satisfactory condition. There were no complications.  [name], MD

## 2017-08-05 NOTE — Anesthesia Procedure Notes (Signed)
Procedure Name: LMA Insertion Date/Time: 08/05/2017 6:15 PM Performed by: Lendon Colonel, CRNA Pre-anesthesia Checklist: Patient identified, Patient being monitored, Timeout performed, Emergency Drugs available and Suction available Patient Re-evaluated:Patient Re-evaluated prior to induction Oxygen Delivery Method: Circle system utilized Preoxygenation: Pre-oxygenation with 100% oxygen Induction Type: IV induction Ventilation: Mask ventilation without difficulty LMA: LMA inserted LMA Size: 4.0 Tube type: Oral Number of attempts: 1 Placement Confirmation: positive ETCO2 and breath sounds checked- equal and bilateral Tube secured with: Tape Dental Injury: Teeth and Oropharynx as per pre-operative assessment

## 2017-08-05 NOTE — Telephone Encounter (Signed)
Noted. Dr. Rogue Bussing notified.

## 2017-08-05 NOTE — Telephone Encounter (Signed)
-----   Message from [place] sent at 08/05/2017  8:43 AM EST ----- Regarding: pt fell Contact: [phone] Dr Ronne Binning) called this AM and wife fell and broke ankle-EMT at their home. Will keep [place] posted.  Will not be here today.

## 2017-08-05 NOTE — Anesthesia Preprocedure Evaluation (Signed)
Anesthesia Evaluation  Patient identified by MRN, date of birth, ID band Patient awake    Reviewed: Allergy & Precautions, H&P , NPO status , Patient's Chart, lab work & pertinent test results, reviewed documented beta blocker date and time   History of Anesthesia Complications Negative for: history of anesthetic complications  Airway Mallampati: I  TM Distance: >3 FB Neck ROM: full    Dental  (+) Dental Advidsory Given, Teeth Intact   Pulmonary neg pulmonary ROS, former smoker,           Cardiovascular Exercise Tolerance: Good negative cardio ROS       Neuro/Psych PSYCHIATRIC DISORDERS (Dementia) negative neurological ROS     GI/Hepatic negative GI ROS, Neg liver ROS,   Endo/Other  negative endocrine ROS  Renal/GU negative Renal ROS  negative genitourinary   Musculoskeletal   Abdominal   Peds  Hematology negative hematology ROS (+)   Anesthesia Other Findings Past Medical History: No date: Arthritis 1995: Brain tumor ([place])     Comment:  meningeoma 2016: Breast cancer ([place]) 12/15/14: Breast cancer of upper-inner quadrant of left female breast  ([place])     Comment:  Completed radiation end of December and finished               chemotherapy 2 weeks ago, Left breast invasive mammary               carcinoma, T1cN49mc (1.5 cm); Grade 3, IMC w/ high grade               DCIS ER negative, [name], [name], . No date: Cataract     Comment:  bilat  No date: Hard of hearing     Comment:  wears hearing aides bilat  No date: Hearing loss No date: History of cancer chemotherapy No date: History of radiation therapy No date: Imbalance No date: Memory impairment     Comment:  seen by Dr SManuella Ghazi possible post crainiotomy from               radiation No date: Numbness and tingling     Comment:  right hand  No date: Shoulder pain, right No date: Wears glasses   Reproductive/Obstetrics negative OB ROS                              Anesthesia Physical Anesthesia Plan  ASA: II  Anesthesia Plan: General   Post-op Pain Management:    Induction:   PONV Risk Score and Plan: 3 and Ondansetron and Dexamethasone  Airway Management Planned:   Additional Equipment:   Intra-op Plan:   Post-operative Plan:   Informed Consent: I have reviewed the patients History and Physical, chart, labs and discussed the procedure including the risks, benefits and alternatives for the proposed anesthesia with the patient or authorized representative who has indicated his/her understanding and acceptance.   Dental Advisory Given  Plan Discussed with: Anesthesiologist, CRNA and Surgeon  Anesthesia Plan Comments:         Anesthesia Quick Evaluation

## 2017-08-05 NOTE — Care Management Note (Signed)
Case Management Note  Patient Details  Name: [name] MRN: [id number] Date of Birth: [birth year]  Subjective/Objective:                  RNCM met with patient and her husband in ED bed 5 prior to ankle surgery today to explain MOON letter. Patient's husband is a retired Therapist, art from [employer].  Patient has some dementia but Dr. Tobe Sos states she still signs all her important documents.  Patient looked toward him every time I asked her a question so he answered CM assessment questions. He states that patient is currently receiving chemotherapy. He states she fell in their home elevator. He stressed that he was not sure if the elevator would hold a wheelchair but that patient would need one.  Patient has a walker and cane but has been independent with mobility. She was alone when she fell inside the elevator.  He states that they have long-term care policy and she has a hired caregiver when he is away.  They have never had home health physical therapy before. He indicated that he hoped she could go to rehab. He requests to speak with Dr. Margaretmary Eddy regarding patient's code status. Dr. Margaretmary Eddy will contact husband again by phone.   Action/Plan: RNCM notified Dr. Margaretmary Eddy that Dr. Tobe Sos would like to speak with her about code status. MOON explained to both Dr. Tobe Sos and Mrs. Keller; copy delivered. RNCM team to follow for transition of care.   Expected Discharge Date:                  Expected Discharge Plan:     In-House Referral:     Discharge planning Services  CM Consult  Post Acute Care Choice:  Durable Medical Equipment, Home Health Choice offered to:  Patient, Spouse  DME Arranged:  Wheelchair manual DME Agency:     HH Arranged:    HH Agency:     Status of Service:  In process, will continue to follow  If discussed at Long Length of Stay Meetings, dates discussed:    Additional Comments:  Marshell Garfinkel, RN 08/05/2017, 1:40 PM

## 2017-08-05 NOTE — Transfer of Care (Signed)
Immediate Anesthesia Transfer of Care Note  Patient: [name]  Procedure(s) Performed: OPEN REDUCTION INTERNAL FIXATION (ORIF) ANKLE FRACTURE (Right Ankle)  Patient Location: PACU  Anesthesia Type:General  Level of Consciousness: awake, alert , oriented and patient cooperative  Airway & Oxygen Therapy: Patient Spontanous Breathing  Post-op Assessment: Report given to RN and Post -op Vital signs reviewed and stable  Post vital signs: Reviewed and stable  Last Vitals:  Vitals:   08/05/17 1359 08/05/17 1554  BP: 126/74 135/67  Pulse: 77 74  Resp: 16   Temp: 37.1 C 36.8 C  SpO2: 100% 99%    Last Pain:  Vitals:   08/05/17 1554  TempSrc: Oral  PainSc:          Complications: No apparent anesthesia complications

## 2017-08-05 NOTE — Plan of Care (Signed)
  Progressing Education: Knowledge of General Education information will improve 08/05/2017 1832 - Progressing by Milderd Meager, RN Health Behavior/Discharge Planning: Ability to manage health-related needs will improve 08/05/2017 1832 - Progressing by Milderd Meager, RN Clinical Measurements: Ability to maintain clinical measurements within normal limits will improve 08/05/2017 1832 - Progressing by Milderd Meager, RN Will remain free from infection 08/05/2017 1832 - Progressing by Milderd Meager, RN Diagnostic test results will improve 08/05/2017 1832 - Progressing by Milderd Meager, RN Respiratory complications will improve 08/05/2017 1832 - Progressing by Milderd Meager, RN Cardiovascular complication will be avoided 08/05/2017 1832 - Progressing by Milderd Meager, RN Activity: Risk for activity intolerance will decrease 08/05/2017 1832 - Progressing by Milderd Meager, RN Nutrition: Adequate nutrition will be maintained 08/05/2017 1832 - Progressing by Milderd Meager, RN Coping: Level of anxiety will decrease 08/05/2017 1832 - Progressing by Milderd Meager, RN Elimination: Will not experience complications related to bowel motility 08/05/2017 1832 - Progressing by Milderd Meager, RN Will not experience complications related to urinary retention 08/05/2017 1832 - Progressing by Milderd Meager, RN Pain Managment: General experience of comfort will improve 08/05/2017 1832 - Progressing by Milderd Meager, RN Safety: Ability to remain free from injury will improve 08/05/2017 1832 - Progressing by Milderd Meager, RN Skin Integrity: Risk for impaired skin integrity will decrease 08/05/2017 1832 - Progressing by Milderd Meager, RN Clinical Measurements: Postoperative complications will be avoided or minimized 08/05/2017 1832 - Progressing by Milderd Meager, RN Skin Integrity: Demonstration of wound healing without infection will  improve 08/05/2017 1832 - Progressing by Milderd Meager, RN

## 2017-08-05 NOTE — ED Notes (Signed)
Pt via ems from home with right ankle deformity, swelling, injury. Pt fell at home in elevator. Husband denies loc or dizziness before fall; states that she c/o back pain before fall. Pt received 50 = 50 mcg fentanyl en route, stated it still hurt.

## 2017-08-05 NOTE — ED Provider Notes (Signed)
[facility] Emergency Department Provider Note       Time seen: ----------------------------------------- 9:58 AM on 08/05/2017 ----------------------------------------- Level V caveat: History/ROS limited by dementia  I have reviewed the triage vital signs and the nursing notes.  HISTORY   Chief Complaint Leg Pain    HPI [name] is a 77 y.o. female with a history of arthritis, brain tumor, breast cancer who presents to the ED for a fall.  Patient's husband is a retired Chief Strategy Officer.  He states he found her after a fall in the elevator.  She was complaining of low back pain as well as right hip pain and had obvious fracture dislocation of the right ankle.  He states he had reduced it but it subsequently re-dislocated.  She arrives via EMS having received fentanyl for pain concerning the right ankle fracture dislocation  Past Medical History:  Diagnosis Date  . Arthritis   . 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 Breast cancer ([place]) 2016  . Breast cancer of upper-inner quadrant of left female breast ([place]) 12/15/14   Completed radiation end of December and finished chemotherapy 2 weeks ago, Left breast invasive mammary carcinoma, T1cN31mc (1.5 cm); Grade 3, [name], [name], [name], .  .Marland KitchenCataract    bilat   . 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 Hearing loss   . History of cancer chemotherapy   . History of radiation therapy   . Imbalance   . Memory impairment    seen by Dr SManuella Ghazi possible post crainiotomy from radiation  . Numbness and tingling    right hand   . Shoulder pain, right   . Wears glasses     Patient Active Problem List   Diagnosis Date Noted  . Breast cancer metastasized to axillary lymph node, left ([place] 06/03/2017  . Gait instability 05/14/2017  . Goals of care, counseling/discussion 05/14/2017  . Syncope 01/04/2017  . Carcinoma of overlapping sites of left breast in  female, estrogen receptor negative ([place] 01/23/2016  . S/P right THA, AA 09/04/2015  . History of artificial joint 09/04/2015  . 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 Neoplasm of meninges 07/18/2015  . DDD (degenerative disc disease), lumbar 01/02/2015  . HLD (hyperlipidemia) 01/02/2015  . Benign ne[place] of [place]es [place] 01/02/2015  . Arthritis, degenerative 01/02/2015  . Psoriasis 01/02/2015  . Rotator cuff arthropathy 09/15/2012    Past Surgical History:  Procedure Laterality Date  . APPENDECTOMY  1950  . BRAIN SURGERY  1995   left frontal/temporal  . BREAST BIOPSY Left 1997  . BREAST BIOPSY Left 12/15/14   confirmed DCIS  . BREAST BIOPSY Left 01/08/2015   Procedure: BREAST BIOPSY WITH NEEDLE LOCALIZATION;  Surgeon: [name] MD;  Location: [facility] ORS;  Service: General;  Laterality: Left;  . BREAST LUMPECTOMY Left 01/08/2015   Procedure: LUMPECTOMY;  Surgeon: [name] MD;  Location: [facility] ORS;  Service: General;  Laterality: Left;  . COLONOSCOPY  2010   Dr. ETiffany Kocher . PORTACATH PLACEMENT Right 01/16/2015   Procedure: INSERTION PORT-A-CATH;  Surgeon: [name] MD;  Location: [facility] ORS;  Service: General;  Laterality: Right;  . SENTINEL NODE BIOPSY Left 01/16/2015   Procedure: SENTINEL NODE BIOPSY;  Surgeon: [name] MD;  Location: [facility] ORS;  Service: General;  Laterality: Left;  . TOTAL HIP ARTHROPLASTY Right 09/04/2015   Procedure: RIGHT  TOTAL HIP ARTHROPLASTY ANTERIOR APPROACH;  Surgeon: [name], MD;  Location: [facility]S;  Service: Orthopedics;  Laterality: Right;    Allergies Donepezil  Social History Social History   Tobacco Use  . Smoking status: Former Smoker    Packs/day: 0.25    Years: 5.00    Pack years: 1.25    Types: Cigarettes    Last attempt to quit: 07/28/1972    Years since quitting: 45.0  . Smokeless tobacco: Never Used  Substance Use Topics  . Alcohol use: Yes    Alcohol/week: 0.6 - 1.2 oz     Types: 1 - 2 Glasses of wine per week    Comment: 1 Glass Wine / Night  . Drug use: No   Review of Systems Constitutional: Negative for fever. Cardiovascular: Negative for chest pain. Respiratory: Negative for shortness of breath. Gastrointestinal: Negative for abdominal pain, vomiting and diarrhea. Genitourinary: Negative for dysuria. Musculoskeletal: Positive right ankle pain, right hip pain, low back pain Skin: Negative for rash. Neurological: Negative for headaches, focal weakness or numbness.  All systems negative/normal/unremarkable except as stated in the HPI  ____________________________________________   PHYSICAL EXAM:  VITAL SIGNS: ED Triage Vitals  Enc Vitals Group     BP 08/05/17 0914 125/69     Pulse Rate 08/05/17 0914 78     Resp 08/05/17 0914 12     Temp 08/05/17 0914 98.2 F (36.8 C)     Temp Source 08/05/17 0914 Axillary     SpO2 08/05/17 0914 100 %     Weight 08/05/17 0911 165 lb (74.8 kg)     Height 08/05/17 0911 '5\' 3"'$  (1.6 m)     Head Circumference --      Peak Flow --      Pain Score 08/05/17 0910 9     Pain Loc --      Pain Edu? --      Excl. in [place]? --     Constitutional: Alert but slightly disoriented.  Mild distress from pain Eyes: Conjunctivae are normal. Normal extraocular movements. ENT   Head: Normocephalic and atraumatic.   Nose: No congestion/rhinnorhea.   Mouth/Throat: Mucous membranes are moist.   Neck: No stridor. Cardiovascular: Normal rate, regular rhythm. No murmurs, rubs, or gallops. Respiratory: Normal respiratory effort without tachypnea nor retractions. Breath sounds are clear and equal bilaterally. No wheezes/rales/rhonchi. Gastrointestinal: Soft and nontender. Normal bowel sounds Musculoskeletal: Extensive deformity is noted to the right ankle.  There is obvious fracture dislocation with significant eversion of the foot.  There is significant skin tenting over the medial malleolus fracture site.  Skin is closed  at this time.  Good dorsalis pedis pulse in the foot, she can wiggle her toes.  Some tenderness is noted over the right hip. Neurologic:  Normal speech and language. No gross focal neurologic deficits are appreciated.  Skin: Ecchymosis and skin tenting over the right ankle medially pallor is noted Psychiatric: Flat affect ____________________________________________  EKG: Interpreted by me.  Sinus rhythm the rate of 78 bpm, normal PR interval, normal QRS, normal QT.  Normal axis.  ____________________________________________  ED COURSE:  As part of my medical decision making, I reviewed the following data within the [place] History obtained from family if available, nursing notes, old chart and ekg, as well as notes from prior ED visits. Patient presented for a fall at home of unspecified etiology with obvious fracture dislocation of the right ankle, we will assess with labs and imaging as indicated at  this time.   .Sedation Date/Time: 08/05/2017 10:51 AM Performed by: Earleen Newport, MD Authorized by: Earleen Newport, MD   Consent:    Consent obtained:  Verbal   Consent given by:  Patient Universal protocol:    Immediately prior to procedure a time out was called: yes   Indications:    Procedure performed:  Fracture reduction Pre-sedation assessment:    Time since last food or drink:  Unknown   NPO status caution: urgency dictates proceeding with non-ideal NPO status     ASA classification: class 3 - patient with severe systemic disease     Neck mobility: normal     Mallampati score:  II - soft palate, uvula, fauces visible   Pre-sedation assessments completed and reviewed: airway patency and mental status   Immediate pre-procedure details:    Reviewed: vital signs   Procedure details (see MAR for exact dosages):    Preoxygenation:  Nasal cannula   Sedation:  Midazolam   Analgesia:  Fentanyl   Intra-procedure monitoring:  Blood pressure  monitoring, cardiac monitor and continuous pulse oximetry   Intra-procedure events: hypoxia     Intra-procedure management:  Supplemental oxygen   Total Provider sedation time (minutes):  15 Post-procedure details:    Post-sedation assessments completed and reviewed: airway patency, cardiovascular function and hydration status     Patient tolerance:  Tolerated well, no immediate complications  Fracture was reduced with gentle ankle manipulation.  She was placed in a posterior and sugar tong splint and postreduction x-rays were ordered. ____________________________________________   LABS (pertinent positives/negatives)  Labs Reviewed  CBC WITH DIFFERENTIAL/PLATELET - Abnormal; Notable for the following components:      Result Value   RDW 15.9 (*)    All other components within normal limits  COMPREHENSIVE METABOLIC PANEL - Abnormal; Notable for the following components:   Chloride 100 (*)    Glucose, Bld 122 (*)    Calcium 8.8 (*)    All other components within normal limits  TROPONIN I    RADIOLOGY Images were viewed by me  FINDINGS: No images were obtained before placement of the plaster cast. There is a comminuted and displaced distal fibular/lateral malleolar fracture. Transverse and displaced medial malleolar fracture. Widening of the ankle mortise. Plantar spur.   Right ankle IMPRESSION: Bimalleolar fracture, closed reduction.  Right hip IMPRESSION: No traumatic finding. 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 Lumbar spine IMPRESSION: Chronic lumbar degenerative changes. No acute or traumatic finding. ____________________________________________  DIFFERENTIAL DIAGNOSIS   Fracture, dislocation, contusion, abrasion, pathologic fracture, syncope, electrolyte abnormality, dehydration, arrhythmia  FINAL ASSESSMENT AND PLAN  Bimalleolar fracture/dislocation   Plan: Patient had presented for a fracture dislocation of the right ankle that was successfully reduced  as above. Patient's labs were unremarkable. Patient's imaging was as dictated above.  I have discussed with orthopedic surgery.  Currently she is splinted and the fracture is been appropriately reduced.  She will proceed under the care of orthopedics by  Dr. Sabra Heck.   Earleen Newport, MD   Note: This note was generated in part or whole with voice recognition software. Voice recognition is usually quite accurate but there are transcription errors that can and very often do occur. I apologize for any typographical errors that were not detected and corrected.     Earleen Newport, MD 08/05/17 1054

## 2017-08-05 NOTE — H&P (Signed)
THE PATIENT WAS SEEN PRIOR TO SURGERY TODAY.  HISTORY, ALLERGIES, HOME MEDICATIONS AND OPERATIVE PROCEDURE WERE REVIEWED. RISKS AND BENEFITS OF SURGERY DISCUSSED WITH PATIENT AGAIN.  NO CHANGES FROM INITIAL HISTORY AND PHYSICAL NOTED.    

## 2017-08-05 NOTE — Anesthesia Post-op Follow-up Note (Signed)
Anesthesia QCDR form completed.        

## 2017-08-06 ENCOUNTER — Encounter: Payer: Self-pay | Admitting: Specialist

## 2017-08-06 ENCOUNTER — Encounter
Admission: RE | Admit: 2017-08-06 | Discharge: 2017-08-06 | Disposition: A | Discharge status: Home / Self Care | Payer: Medicare Other | Source: Ambulatory Visit | Attending: Internal Medicine | Admitting: Internal Medicine

## 2017-08-06 DIAGNOSIS — S82841A Displaced bimalleolar fracture of right lower leg, initial encounter for closed fracture: Secondary | ICD-10-CM | POA: Diagnosis not present

## 2017-08-06 LAB — COMPREHENSIVE METABOLIC PANEL
ALBUMIN: 3.5 g/dL (ref 3.5–5.0)
ALT: 23 U/L (ref 14–54)
ANION GAP: 8 (ref 5–15)
AST: 23 U/L (ref 15–41)
Alkaline Phosphatase: 50 U/L (ref 38–126)
BUN: 15 mg/dL (ref 6–20)
CHLORIDE: 101 mmol/L (ref 101–111)
CO2: 24 mmol/L (ref 22–32)
Calcium: 8.4 mg/dL — ABNORMAL LOW (ref 8.9–10.3)
Creatinine, Ser: 0.69 mg/dL (ref 0.44–1.00)
GFR calc Af Amer: >60 mL/min (ref >60)
GFR calc non Af Amer: >60 mL/min (ref >60)
GLUCOSE: 154 mg/dL — AB (ref 65–99)
POTASSIUM: 4.2 mmol/L (ref 3.5–5.1)
SODIUM: 133 mmol/L — AB (ref 135–145)
TOTAL PROTEIN: 6.2 g/dL — AB (ref 6.5–8.1)
Total Bilirubin: 0.5 mg/dL (ref 0.3–1.2)

## 2017-08-06 MED ORDER — CLINDAMYCIN HCL 150 MG PO CAPS
600.0000 mg | ORAL_CAPSULE | Freq: Once | ORAL | Status: AC
  Administered 2017-08-06: 600 mg via ORAL
  Filled 2017-08-06: qty 2

## 2017-08-06 NOTE — Clinical Social Work Note (Signed)
Clinical Social Work Assessment  Patient Details  Name: [name] MRN: [id number] Date of Birth: [birth year]  Date of referral:  08/06/17               Reason for consult:  Facility Placement                Permission sought to share information with:  Chartered certified accountant granted to share information::  Yes, Verbal Permission Granted  Name::      [place]::   [place]   Relationship::     Contact Information:     Housing/Transportation Living arrangements for the past 2 months:  [place] of Information:  Patient, Spouse Patient Interpreter Needed:  None Criminal Activity/Legal Involvement Pertinent to Current Situation/Hospitalization:  No - Comment as needed Significant Relationships:  Spouse Lives with:  Spouse Do you feel safe going back to the place where you live?  Yes Need for family participation in patient care:  Yes (Comment)  Care giving concerns:  Patient lives in [place] with her husband [name].    Social Worker assessment / plan:  Holiday representative (CSW) received verbal consult from RN that patient wants to go to SNF. PT is recommending SNF. CSW met with patient alone at bedside to discuss D/C plan. Patient was alert and oriented X4 and was sitting up in the chair at bedside. CSW introduced self and explained role of CSW department. Patient reported that she lives in [place] with her husband. CSW explained that medicare will not pay for SNF because she is under observation. Patient verbalized her understanding and stated that she and her husband will pay out of pocket to go to [facility] Per patient she is putting her chemo/ radiation on hold until after rehab. FL2 complete and faxed out.   Patient's husband [name] talked with [facility] admissions coordinator at [facility] and worked out a Secretary/administrator. Per [facility] admissions coordinator at [facility] they can accept patient  under private pay. CSW met with patient and her husband was at bedside the second time. Patient and husband are agreeable for patient to go to [facility] under private pay. CSW will continue to follow and assist as needed.   Employment status:  Retired Forensic scientist:  Medicare PT Recommendations:  [place] / Referral to community resources:  [place]  Patient/Family's Response to care:  Patient and her husband are agreeable for patient to D/C to [facility]   Patient/Family's Understanding of and Emotional Response to Diagnosis, Current Treatment, and Prognosis:  Patient was very pleasant and thanked CSW for assistance.   Emotional Assessment Appearance:  Appears stated age Attitude/Demeanor/Rapport:    Affect (typically observed):  Accepting, Adaptable, Pleasant Orientation:  Oriented to Self, Oriented to Place, Oriented to  Time, Oriented to Situation Alcohol / Substance use:  Not Applicable Psych involvement (Current and /or in the community):  No (Comment)  Discharge Needs  Concerns to be addressed:  Discharge Planning Concerns Readmission within the last 30 days:  No Current discharge risk:  Dependent with Mobility Barriers to Discharge:  Continued Medical Work up   [employer], Michele Beets, LCSW 08/06/2017, 4:04 PM

## 2017-08-06 NOTE — Evaluation (Signed)
Physical Therapy Evaluation Patient Details Name: [name] MRN: [id number] DOB: [birth year] Today's Date: 08/06/2017   History of Present Illness  Pt is a 77 y.o. female who was admitted to [facility] for ORIF repair of a TriMalleolar, comminuted Fracture, dislocation of the right ankle sustained during a fall. Pt. has had 3 falls in the lst 6 months. Pt. PMHx includes: Metastatic breast CA, Brain Tumor, Athritis, Cataracts, HOH, Right hand numbness, and tingling.      Clinical Impression  Pt presents with deficits in strength, transfers, mobility, gait, balance, and activity tolerance.  Pt was Mod Ind with bed mobility tasks and CGA with sit to/from stand transfers and stand pivot transfers with min verbal cues for sequencing to maintain RLE NWB status.  Pt presented with poor activity tolerance in standing, however, and was only able to stand for 5-10 sec before requiring to return to sitting.  Pt was unable to perform a hop-to step with a RW.  Patient suffers from a trimalleolar ankle fracture and is non-weightbearing to the RLE which impairs her ability to perform daily activities like toileting, feeding, dressing, grooming, and bathing in the home as well as for safe home entry and exit. A walker will not resolve the patient's issue with performing activities of daily living as pt is unable to perform hop-to amb and has very limited activity tolerance in standing on one leg. A wheelchair is required/recommended and will allow patient to safely perform daily activities.  Patient has a caregiver who can provide assistance with wheelchair management.   Pt will benefit from PT services in a SNF setting upon discharge to safely address above deficits for decreased caregiver assistance and eventual return to PLOF.        Follow Up Recommendations SNF    Equipment Recommendations  None recommended by PT    Recommendations for Other Services       Precautions / Restrictions  Precautions Precautions: Fall Restrictions Weight Bearing Restrictions: Yes RLE Weight Bearing: Non weight bearing      Mobility  Bed Mobility Overal bed mobility: Modified Independent             General bed mobility comments: Good effort and speed during sup to/from sit with use of rails   Transfers Overall transfer level: Needs assistance Equipment used: Rolling walker (2 wheeled) Transfers: Sit to/from Stand Sit to Stand: Min guard; SPT: CGA          General transfer comment: Good concentric and eccentric control during transfers with only minimal verbal cues required to maintain RLE NWB status  Ambulation/Gait             General Gait Details: Pt unable to perform a hop-to step  Stairs            Wheelchair Mobility    Modified Rankin (Stroke Patients Only)       Balance Overall balance assessment: Needs assistance   Sitting balance-Leahy Scale: Normal     Standing balance support: Bilateral upper extremity supported Standing balance-Leahy Scale: Fair Standing balance comment: RLE NWB status maintained                             Pertinent Vitals/Pain Pain Assessment: 0-10 Pain Score: 2  Pain Location: R ankle Pain Descriptors / Indicators: Sore;Aching Pain Intervention(s): Premedicated before session;Monitored during session;Limited activity within patient's tolerance    Home Living Family/patient expects to be discharged to:: Private residence Living  Arrangements: Spouse/significant other Available Help at Discharge: Family Type of Home: House Home Access: Stairs to enter Entrance Stairs-Rails: None Entrance Stairs-Number of Steps: 1 Home Layout: Two level Home Equipment: Environmental consultant - 2 wheels;Cane - single point;Toilet riser;Grab bars - tub/shower;Shower seat;Other (comment)(3-prong cane)      Prior Function Level of Independence: Independent with assistive device(s)         Comments: Mod Ind with amb with  3-prong cane; Pt was independent with ADLs, IADLs, light meal preparation, and medication management.      Hand Dominance   Dominant Hand: Right    Extremity/Trunk Assessment   Upper Extremity Assessment Upper Extremity Assessment: Defer to OT evaluation    Lower Extremity Assessment Lower Extremity Assessment: Generalized weakness;RLE deficits/detail RLE Deficits / Details: R hip and knee strength WFL RLE: Unable to fully assess due to immobilization       Communication   Communication: No difficulties  Cognition Arousal/Alertness: Awake/alert Behavior During Therapy: WFL for tasks assessed/performed Overall Cognitive Status: No family/caregiver present to determine baseline cognitive functioning                                 General Comments: Pt able to follow commands well with minimal verbal cues      General Comments      Exercises Total Joint Exercises Ankle Circles/Pumps: AROM;Left;10 reps Quad Sets: Strengthening;Both;10 reps Gluteal Sets: Strengthening;Both;10 reps Hip ABduction/ADduction: AROM;Both;10 reps Straight Leg Raises: AROM;Both;10 reps Long Arc Quad: AROM;Both;10 reps Knee Flexion: AROM;Both;10 reps   Assessment/Plan    PT Assessment Patient needs continued PT services  PT Problem List Decreased strength;Decreased activity tolerance;Decreased balance;Decreased knowledge of use of DME;Decreased mobility;Decreased safety awareness;Decreased knowledge of precautions       PT Treatment Interventions DME instruction;Gait training;Functional mobility training;Neuromuscular re-education;Balance training;Therapeutic exercise;Therapeutic activities;Patient/family education    PT Goals (Current goals can be found in the Care Plan section)  Acute Rehab PT Goals Patient Stated Goal: To walk again PT Goal Formulation: With patient Time For Goal Achievement: 08/19/17 Potential to Achieve Goals: Good    Frequency BID   Barriers to  discharge        Co-evaluation               AM-PAC PT "6 Clicks" Daily Activity  Outcome Measure Difficulty turning over in bed (including adjusting bedclothes, sheets and blankets)?: None Difficulty moving from lying on back to sitting on the side of the bed? : None Difficulty sitting down on and standing up from a chair with arms (e.g., wheelchair, bedside commode, etc,.)?: A Little Help needed moving to and from a bed to chair (including a wheelchair)?: A Little Help needed walking in hospital room?: Total Help needed climbing 3-5 steps with a railing? : Total 6 Click Score: 16    End of Session Equipment Utilized During Treatment: Gait belt Activity Tolerance: Patient tolerated treatment well Patient left: in chair;with chair alarm set;with call bell/phone within reach;with SCD's reapplied(SCD to L foot only) Nurse Communication: Mobility status PT Visit Diagnosis: Other abnormalities of gait and mobility (R26.89);Muscle weakness (generalized) (M62.81)    Time: 1610-9604 PT Time Calculation (min) (ACUTE ONLY): 32 min   Charges:   PT Evaluation $PT Eval Low Complexity: 1 Low PT Treatments $Therapeutic Exercise: 8-22 mins   PT G Codes:        DRoyetta Asal PT, DPT 08/06/17, 1:24 PM

## 2017-08-06 NOTE — Plan of Care (Signed)
  Progressing Education: Knowledge of General Education information will improve 08/06/2017 1539 - Progressing by Milderd Meager, RN Health Behavior/Discharge Planning: Ability to manage health-related needs will improve 08/06/2017 1539 - Progressing by Milderd Meager, RN Clinical Measurements: Ability to maintain clinical measurements within normal limits will improve 08/06/2017 1539 - Progressing by Milderd Meager, RN Will remain free from infection 08/06/2017 1539 - Progressing by Milderd Meager, RN Diagnostic test results will improve 08/06/2017 1539 - Progressing by Milderd Meager, RN Respiratory complications will improve 08/06/2017 1539 - Progressing by Milderd Meager, RN Cardiovascular complication will be avoided 08/06/2017 1539 - Progressing by Milderd Meager, RN Activity: Risk for activity intolerance will decrease 08/06/2017 1539 - Progressing by Milderd Meager, RN Nutrition: Adequate nutrition will be maintained 08/06/2017 1539 - Progressing by Milderd Meager, RN Coping: Level of anxiety will decrease 08/06/2017 1539 - Progressing by Milderd Meager, RN Elimination: Will not experience complications related to bowel motility 08/06/2017 1539 - Progressing by Milderd Meager, RN Will not experience complications related to urinary retention 08/06/2017 1539 - Progressing by Milderd Meager, RN Pain Managment: General experience of comfort will improve 08/06/2017 1539 - Progressing by Milderd Meager, RN Safety: Ability to remain free from injury will improve 08/06/2017 1539 - Progressing by Milderd Meager, RN Skin Integrity: Risk for impaired skin integrity will decrease 08/06/2017 1539 - Progressing by Milderd Meager, RN Clinical Measurements: Postoperative complications will be avoided or minimized 08/06/2017 1539 - Progressing by Milderd Meager, RN Skin Integrity: Demonstration of wound healing without infection  will improve 08/06/2017 1539 - Progressing by Milderd Meager, RN

## 2017-08-06 NOTE — NC FL2 (Signed)
[place] LEVEL OF CARE SCREENING TOOL     IDENTIFICATION  Patient Name: [name] Birthdate: [birth year] Sex: female Admission Date (Current Location): 08/05/2017  [place] and [place] Number:  Engineering geologist and Address:  [facility], [address], [place], [place] [address]      Provider Number: [id number]  Attending Physician Name and Address:  Epifanio Lesches, MD  Relative Name and Phone Number:       Current Level of Care: Hospital Recommended Level of Care: [place] Prior Approval Number:    Date Approved/Denied:   PASRR Number: [id number])  Discharge Plan: SNF    Current Diagnoses: Patient Active Problem List   Diagnosis Date Noted  . Ankle fracture, bimalleolar, closed, right, initial encounter 08/05/2017  . Closed right ankle fracture 08/05/2017  . Breast cancer metastasized to axillary lymph node, left ([place]) 06/03/2017  . Gait instability 05/14/2017  . Goals of care, counseling/discussion 05/14/2017  . Syncope 01/04/2017  . Carcinoma of overlapping sites of left breast in female, estrogen receptor negative ([place]) 01/23/2016  . S/P right THA, AA 09/04/2015  . History of artificial joint 09/04/2015  . 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 Neoplasm of meninges 07/18/2015  . DDD (degenerative disc disease), lumbar 01/02/2015  . HLD (hyperlipidemia) 01/02/2015  . Benign neoplasm of meninges ([place]) 01/02/2015  . Arthritis, degenerative 01/02/2015  . Psoriasis 01/02/2015  . Rotator cuff arthropathy 09/15/2012    Orientation RESPIRATION BLADDER Height & Weight     Self, Time, Situation, Place  Normal Continent Weight: 165 lb (74.8 kg) Height:  5\' 3"  (160 cm)  BEHAVIORAL SYMPTOMS/MOOD NEUROLOGICAL BOWEL NUTRITION STATUS      Continent Diet(Diet: Regular. )  AMBULATORY STATUS COMMUNICATION OF NEEDS Skin   Extensive Assist Verbally Surgical wounds(Incision: Right  Ankle. )                       Personal Care Assistance Level of Assistance  Bathing, Feeding, Dressing Bathing Assistance: Limited assistance Feeding assistance: Independent Dressing Assistance: Limited assistance     Functional Limitations Info  Sight, Hearing, Speech Sight Info: Adequate Hearing Info: Impaired Speech Info: Adequate    SPECIAL CARE FACTORS FREQUENCY  PT (By licensed PT), OT (By licensed OT)     PT Frequency: (5) OT Frequency: (5)            Contractures      Additional Factors Info  Code Status, Allergies Code Status Info: (Full Code. ) Allergies Info: (Donepezil)           Current Medications (08/06/2017):  This is the current hospital active medication list Current Facility-Administered Medications  Medication Dose Route Frequency Provider Last Rate Last Dose  . 0.45 % sodium chloride infusion   Intravenous Continuous Earnestine Leys, MD   Stopped at 08/06/17 1053  . acetaminophen (TYLENOL) tablet 650 mg  650 mg Oral Q4H PRN Earnestine Leys, MD       Or  . acetaminophen (TYLENOL) suppository 650 mg  650 mg Rectal Q4H PRN Earnestine Leys, MD      . bisacodyl (DULCOLAX) suppository 10 mg  10 mg Rectal Daily PRN Earnestine Leys, MD      . ceFAZolin (ANCEF) IVPB 2g/100 mL premix  2 g Intravenous Q8H Earnestine Leys, MD   Stopped at 08/06/17 9485  . celecoxib (CELEBREX) capsule 200 mg  200 mg Oral Q12H Earnestine Leys, MD   200 mg at  08/06/17 1054  . clindamycin (CLEOCIN) IVPB 600 mg  600 mg Intravenous Lajuan Lines, MD   Stopped at 08/06/17 [phone]  . enoxaparin (LOVENOX) injection 30 mg  30 mg Subcutaneous Q24H Earnestine Leys, MD   30 mg at 08/06/17 1055  . HYDROcodone-acetaminophen (NORCO/VICODIN) 5-325 MG per tablet 1 tablet  1 tablet Oral Q4H PRN Earnestine Leys, MD   1 tablet at 08/06/17 1054  . magnesium hydroxide (MILK OF MAGNESIA) suspension 30 mL  30 mL Oral Daily PRN Earnestine Leys, MD      . methocarbamol (ROBAXIN) tablet 500 mg  500 mg  Oral Q6H PRN Earnestine Leys, MD       Or  . methocarbamol (ROBAXIN) 500 mg in dextrose 5 % 50 mL IVPB  500 mg Intravenous Q6H PRN Earnestine Leys, MD      . metoCLOPramide (REGLAN) tablet 5-10 mg  5-10 mg Oral Q8H PRN Earnestine Leys, MD       Or  . metoCLOPramide (REGLAN) injection 5-10 mg  5-10 mg Intravenous Q8H PRN Earnestine Leys, MD      . morphine 2 MG/ML injection 1 mg  1 mg Intravenous Q2H PRN Earnestine Leys, MD      . ondansetron [facility]) tablet 4 mg  4 mg Oral Q6H PRN Earnestine Leys, MD       Or  . ondansetron [facility]) injection 4 mg  4 mg Intravenous Q6H PRN Earnestine Leys, MD      . senna (SENOKOT) tablet 8.6 mg  1 tablet Oral BID Earnestine Leys, MD   8.6 mg at 08/06/17 1054  . sodium phosphate (FLEET) 7-19 GM/118ML enema 1 enema  1 enema Rectal Once PRN Earnestine Leys, MD      . vitamin E capsule 400 Units  400 Units Oral Daily Earnestine Leys, MD   400 Units at 08/06/17 1054     Discharge Medications: Please see discharge summary for a list of discharge medications.  Relevant Imaging Results:  Relevant Lab Results:   Additional Information (SSN: [id number])  Lynnox Girten, Veronia Beets, LCSW

## 2017-08-06 NOTE — Clinical Social Work Placement (Signed)
   CLINICAL SOCIAL WORK PLACEMENT  NOTE  Date:  08/06/2017  Patient Details  Name: [name] MRN: [id number] Date of Birth: February 12, 1941  Clinical Social Work is seeking post-discharge placement for this patient at the [place] level of care (*CSW will initial, date and re-position this form in  chart as items are completed):  Yes   Patient/family provided with [place] Work Department's list of facilities offering this level of care within the geographic area requested by the patient (or if unable, by the patient's family).  Yes   Patient/family informed of their freedom to choose among providers that offer the needed level of care, that participate in Medicare, Medicaid or managed care program needed by the patient, have an available bed and are willing to accept the patient.  Yes   Patient/family informed of [facility]'s ownership interest in [facility] and [facility], as well as of the fact that they are under no obligation to receive care at these facilities.  PASRR submitted to EDS on 08/06/17     PASRR number received on 08/06/17     Existing PASRR number confirmed on       FL2 transmitted to all facilities in geographic area requested by pt/family on 08/06/17     FL2 transmitted to all facilities within larger geographic area on       Patient informed that his/her managed care company has contracts with or will negotiate with certain facilities, including the following:        Yes   Patient/family informed of bed offers received.  Patient chooses bed at [facility] )     Physician recommends and patient chooses bed at      Patient to be transferred to   on  .  Patient to be transferred to facility by       Patient family notified on   of transfer.  Name of family member notified:        PHYSICIAN       Additional Comment:    _______________________________________________ Chanise Habeck, Veronia Beets,  LCSW 08/06/2017, 4:03 PM

## 2017-08-06 NOTE — Evaluation (Signed)
Occupational Therapy Evaluation Patient Details Name: [name] MRN: [id number] DOB: 15-Oct-1940 Today's Date: 08/06/2017    History of Present Illness Pt. is a 77 y.o. female who was admitted to [facility] for ORIF repair of a TriMalleolar, comminuted Fracture, dislocation of the right ankle sustained during a fall. Pt. has had 3 falls in the lst 6 months. Pt. PMHx includes: Metastatic breast CA, Brain Tumor, Athritis, Cataracts, HOH, Right hand numbness, and tingling.     Clinical Impression   Pt. presents with pain, NWB through the RLE, pain, and limited mobility which limit her ability to complete basic ADL care needs. Pt. resides at home with her husband. Pt. was independent with ADLs, light meal preparation, medication management. Pt.'s husband, and family assist pt. as needed with ADLs, and IADLs. Pt. no longer drives. Pt. was receiving Chemotherapy, however reports that pt. will now be receiving immunotherapy treatments. Pt./family education was provided verbally, and through visual demonstration about A/E use for LE ADLs, and DME. Pt. could benefit from OT services for ADL training, A/E training, and pt. Education about home modification, and DME. Pt. Could benefit from SNF level of care upon discharge with follow-up OT services.    Follow Up Recommendations  SNF    Equipment Recommendations       Recommendations for Other Services       Precautions / Restrictions Restrictions Weight Bearing Restrictions: Yes RLE Weight Bearing: Non weight bearing                                                    ADL either performed or assessed with clinical judgement   ADL Overall ADL's : Needs assistance/impaired Eating/Feeding: Set up   Grooming: Set up;Bed level   Upper Body Bathing: Minimal assistance   Lower Body Bathing: Maximal assistance   Upper Body Dressing : Minimal assistance   Lower Body Dressing: Maximal assistance                Functional mobility during ADLs: (deferred. Pt. seen at bedlevel.) General ADL Comments: Pt. education was provided about A/E use for LE ADLs.     Vision Baseline Vision/History: Wears glasses Wears Glasses: At all times       Perception     Praxis      Pertinent Vitals/Pain Pain Assessment: 0-10 Pain Score: 7  Pain Descriptors / Indicators: Aching Pain Intervention(s): Limited activity within patient's tolerance;Monitored during session     Hand Dominance Right   Extremity/Trunk Assessment Upper Extremity Assessment Upper Extremity Assessment: Overall WFL for tasks assessed           Communication Communication Communication: No difficulties   Cognition                                           General Comments       Exercises     Shoulder Instructions      Home Living Family/patient expects to be discharged to:: Skilled nursing facility Living Arrangements: Spouse/significant other Available Help at Discharge: Family Type of Home: House       Home Layout: Two level     Bathroom Shower/Tub: Walk-in shower;Door(Pt. has access to 2 tub showers upstairs.)  Home Equipment: [place] - 2 wheels;Cane - single point;Toilet riser;Grab bars - tub/shower;Shower seat          Prior Functioning/Environment Level of Independence: Independent with assistive device(s);Independent        Comments: Pt. was independent with ADLs, IADLs, light meal preparation, and medication management.         OT Problem List: Decreased strength;Pain;Decreased knowledge of use of DME or AE;Decreased activity tolerance      OT Treatment/Interventions: Self-care/ADL training;Therapeutic exercise;Patient/family education;Therapeutic activities;DME and/or AE instruction    OT Goals(Current goals can be found in the care plan section) Acute Rehab OT Goals Patient Stated Goal: To go to rehab OT Goal Formulation: With patient Potential to Achieve  Goals: Good  OT Frequency: Min 2X/week   Barriers to D/C:            Co-evaluation              AM-PAC PT "6 Clicks" Daily Activity     Outcome Measure Help from another person eating meals?: None Help from another person taking care of personal grooming?: None Help from another person toileting, which includes using toliet, bedpan, or urinal?: A Lot Help from another person bathing (including washing, rinsing, drying)?: A Lot Help from another person to put on and taking off regular upper body clothing?: A Little Help from another person to put on and taking off regular lower body clothing?: A Lot 6 Click Score: 17   End of Session    Activity Tolerance: Patient tolerated treatment well Patient left: in bed;with call bell/phone within reach;with family/visitor present  OT Visit Diagnosis: Muscle weakness (generalized) (M62.81);History of falling (Z91.81);Repeated falls (R29.6)                Time: 4949-4473 OT Time Calculation (min): 40 min Charges:  OT General Charges $OT Visit: 1 Visit OT Evaluation $OT Eval Low Complexity: 1 Low G-Codes:     Harrel Carina, MS, OTR/L   Harrel Carina, MS, OTR/L 08/06/2017, 11:56 AM

## 2017-08-06 NOTE — Progress Notes (Signed)
Physical Therapy Treatment Patient Details Name: [name] MRN: [id number] DOB: 1941-07-18 Today's Date: 08/06/2017    History of Present Illness Pt is a 77 y.o. female who was admitted to [facility] for ORIF repair of a TriMalleolar, comminuted Fracture, dislocation of the right ankle sustained during a fall. Pt. has had 3 falls in the lst 6 months. Pt. PMHx includes: Metastatic breast CA, Brain Tumor, Athritis, Cataracts, HOH, Right hand numbness, and tingling.      PT Comments    Pt presents with deficits in strength, transfers, mobility, gait, balance, and activity tolerance but presented with improved functional strength overall this session.  Pt able to transfer to standing with CGA and mod verbal cues for sequencing and was able to stand around 30 sec before requiring to return to sitting.  Pt's R foot placed on top of this PTs foot to ensure NWB status maintained.  Pt tolerated transfers, therex, and standing activities well with no adverse effects.  Pt will benefit from PT services in a SNF setting upon discharge to safely address above deficits for decreased caregiver assistance and eventual return to PLOF.     Follow Up Recommendations  SNF     Equipment Recommendations  None recommended by PT    Recommendations for Other Services       Precautions / Restrictions Precautions Precautions: Fall Restrictions Weight Bearing Restrictions: Yes RLE Weight Bearing: Non weight bearing    Mobility  Bed Mobility Overal bed mobility: Modified Independent             General bed mobility comments: Pt in recliner this session  Transfers Overall transfer level: Needs assistance Equipment used: Rolling walker (2 wheeled) Transfers: Sit to/from Stand Sit to Stand: Min guard         General transfer comment: Good concentric and eccentric control during transfers with only minimal verbal cues required to maintain RLE NWB status  Ambulation/Gait             General  Gait Details: Unable   Stairs            Wheelchair Mobility    Modified Rankin (Stroke Patients Only)       Balance Overall balance assessment: Needs assistance   Sitting balance-Leahy Scale: Normal     Standing balance support: Bilateral upper extremity supported Standing balance-Leahy Scale: Fair Standing balance comment: RLE NWB status maintained                            Cognition Arousal/Alertness: Awake/alert Behavior During Therapy: WFL for tasks assessed/performed Overall Cognitive Status: History of cognitive impairments - at baseline                                 General Comments: Pt able to follow commands well with minimal verbal cues      Exercises Total Joint Exercises Ankle Circles/Pumps: AROM;Left;10 reps Quad Sets: Strengthening;Both;10 reps Gluteal Sets: Strengthening;Both;10 reps Hip ABduction/ADduction: AROM;Both;10 reps Straight Leg Raises: AROM;Both;10 reps Long Arc Quad: AROM;Both;10 reps Knee Flexion: AROM;Both;10 reps Other Exercises Other Exercises: LLE low amplitude mini squats 2 x 5    General Comments        Pertinent Vitals/Pain Pain Assessment: 0-10 Pain Score: 2  Pain Location: R ankle Pain Descriptors / Indicators: Sore;Aching Pain Intervention(s): Premedicated before session;Monitored during session;Limited activity within patient's tolerance    Home Living  Prior Function            PT Goals (current goals can now be found in the care plan section) Acute Rehab PT Goals Patient Stated Goal: To walk again PT Goal Formulation: With patient Time For Goal Achievement: 08/19/17 Potential to Achieve Goals: Good Progress towards PT goals: Progressing toward goals    Frequency    BID      PT Plan Current plan remains appropriate    Co-evaluation              AM-PAC PT "6 Clicks" Daily Activity  Outcome Measure  Difficulty turning over in  bed (including adjusting bedclothes, sheets and blankets)?: None Difficulty moving from lying on back to sitting on the side of the bed? : None Difficulty sitting down on and standing up from a chair with arms (e.g., wheelchair, bedside commode, etc,.)?: A Little Help needed moving to and from a bed to chair (including a wheelchair)?: A Little Help needed walking in hospital room?: Total Help needed climbing 3-5 steps with a railing? : Total 6 Click Score: 16    End of Session Equipment Utilized During Treatment: Gait belt Activity Tolerance: Patient tolerated treatment well Patient left: in chair;with chair alarm set;with call bell/phone within reach;with SCD's reapplied Nurse Communication: Mobility status PT Visit Diagnosis: Other abnormalities of gait and mobility (R26.89);Muscle weakness (generalized) (M62.81)     Time: 2824-1753 PT Time Calculation (min) (ACUTE ONLY): 27 min  Charges:  $Therapeutic Exercise: 8-22 mins $Therapeutic Activity: 8-22 mins                    G Codes:       DRoyetta Asal PT, DPT 08/06/17, 5:06 PM

## 2017-08-06 NOTE — Care Management (Signed)
Request made to [facility] with PT to assess for wheelchair requested by spouse.

## 2017-08-06 NOTE — Progress Notes (Signed)
Pt w/o IV access.Madaline Brilliant to leave out per Dr. Vianne Bulls. Ok to substitute last dose of IV abx with PO clinda. Orders placed.

## 2017-08-06 NOTE — Plan of Care (Signed)
  Education: Knowledge of General Education information will improve 08/06/2017 0439 - Progressing by Ommie Degeorge, Lucille Passy, RN   Health Behavior/Discharge Planning: Ability to manage health-related needs will improve 08/06/2017 0439 - Progressing by Divya Munshi, Lucille Passy, RN   Clinical Measurements: Ability to maintain clinical measurements within normal limits will improve 08/06/2017 0439 - Progressing by Ada Holness, Lucille Passy, RN Will remain free from infection 08/06/2017 0439 - Progressing by Gem Conkle, Lucille Passy, RN Diagnostic test results will improve 08/06/2017 0439 - Progressing by Anaira Seay, Lucille Passy, RN Respiratory complications will improve 08/06/2017 0439 - Progressing by Bryna Colander, RN Cardiovascular complication will be avoided 08/06/2017 0439 - Progressing by Jude Naclerio, Lucille Passy, RN

## 2017-08-06 NOTE — Progress Notes (Signed)
[facility] NAME: [name]    MR#:  [id number]  DATE OF BIRTH:  [birth year]  SUBJECTIVE:  CHIEF COMPLAINT:   Chief Complaint  Patient presents with  . Leg Pain    REVIEW OF SYSTEMS:   ROS CONSTITUTIONAL: No fever, fatigue or weakness.  EYES: No blurred or double vision.  EARS, NOSE, AND THROAT: No tinnitus or ear pain.  RESPIRATORY: No cough, shortness of breath, wheezing or hemoptysis.  CARDIOVASCULAR: No chest pain, orthopnea, edema.  GASTROINTESTINAL: No nausea, vomiting, diarrhea or abdominal pain.  GENITOURINARY: No dysuria, hematuria.  ENDOCRINE: No polyuria, nocturia,  HEMATOLOGY: No anemia, easy bruising or bleeding SKIN: No rash or lesion. MUSCULOSKELETAL: No joint pain or arthritis.   NEUROLOGIC: No tingling, numbness, weakness.  PSYCHIATRY: No anxiety or depression.   DRUG ALLERGIES:   Allergies  Allergen Reactions  . Donepezil     Other reaction(s): Other (See Comments) Nightmares    VITALS:  Blood pressure (!) 111/55, pulse 89, temperature 98.1 F (36.7 C), temperature source Oral, resp. rate 20, height 5\' 3"  (1.6 m), weight 74.8 kg (165 lb), SpO2 94 %.  PHYSICAL EXAMINATION:  GENERAL:  77 y.o.-year-old patient lying in the bed with no acute distress.  EYES: Pupils equal, round, reactive to light and accommodation. No scleral icterus. Extraocular muscles intact.  HEENT: Head atraumatic, normocephalic. Oropharynx and nasopharynx clear.  NECK:  Supple, no jugular venous distention. No thyroid enlargement, no tenderness.  LUNGS: Normal breath sounds bilaterally, no wheezing, rales,rhonchi or crepitation. No use of accessory muscles of respiration.  CARDIOVASCULAR: S1, S2 normal. No murmurs, rubs, or gallops.  ABDOMEN: Soft, nontender, nondistended. Bowel sounds present. No organomegaly or mass.  EXTREMITIES: No pedal edema, cyanosis, or clubbing.  NEUROLOGIC: Cranial nerves II through XII  are intact. Muscle strength 5/5 in all extremities. Sensation intact. Gait not checked.  PSYCHIATRIC: The patient is alert and oriented x 3.  SKIN: No obvious rash, lesion, or ulcer.    LABORATORY PANEL:   CBC Recent Labs  Lab 08/05/17 0923  WBC 6.3  HGB 12.5  HCT 37.4  PLT 360   ------------------------------------------------------------------------------------------------------------------  Chemistries  Recent Labs  Lab 08/06/17 0441  NA 133*  K 4.2  CL 101  CO2 24  GLUCOSE 154*  BUN 15  CREATININE 0.69  CALCIUM 8.4*  AST 23  ALT 23  ALKPHOS 50  BILITOT 0.5   ------------------------------------------------------------------------------------------------------------------  Cardiac Enzymes Recent Labs  Lab 08/05/17 0923  TROPONINI <0.03   ------------------------------------------------------------------------------------------------------------------  RADIOLOGY:  Dg Lumbar Spine 2-3 Views  Result Date: 08/05/2017 CLINICAL DATA:  [place] at home in the elevator.  Back pain. 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 No evidence of fracture or other focal lesion. IMPRESSION: Chronic lumbar degenerative changes.  No acute or traumatic finding. Electronically Signed   By: Nelson Chimes M.D.   On: 08/05/2017 10:23   Dg Ankle Complete Right  Result Date: 08/05/2017 CLINICAL DATA:  Continued surveillance of ankle deformity. EXAM: RIGHT ANKLE - COMPLETE 3+ VIEW COMPARISON:  None available. FINDINGS: No images were obtained before placement of the plaster cast. There is a comminuted and displaced distal fibular/lateral malleolar fracture. Transverse and displaced medial malleolar fracture. Widening of the ankle mortise. Plantar spur. IMPRESSION: Bimalleolar fracture, closed reduction. Electronically Signed   By: Staci Righter M.D.   On:  08/05/2017  10:19   Dg Chest Port 1 View  Result Date: 08/05/2017 CLINICAL DATA:  Shortness of breath, former smoker. History of metastatic breast malignancy, has undergone radiation therapy and chemotherapy. EXAM: PORTABLE CHEST 1 VIEW COMPARISON:  Chest x-ray of June 22, 2017 and chest CT scan of July 31, 2017 FINDINGS: The lungs are adequately inflated and clear. The heart and pulmonary vascularity are normal. The mediastinum is normal in width. The power port catheter tip projects over the distal third of the SVC. The bony thorax exhibits no acute abnormality. IMPRESSION: There is no acute cardiopulmonary abnormality. Electronically Signed   By: David  Martinique M.D.   On: 08/05/2017 12:14   Dg Hip Unilat W Or Wo Pelvis 2-3 Views Right  Result Date: 08/05/2017 CLINICAL DATA:  [place] at home in the elevator.  Hip pain. EXAM: DG HIP (WITH OR WITHOUT PELVIS) 2-3V RIGHT COMPARISON:  09/04/2015 FINDINGS: Previous total hip replacement on the right. Components appear well positioned. No sign of fracture or dislocation. No other right pelvic injury seen. Osteoarthritis of the left hip. IMPRESSION: No traumatic finding.  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 Electronically Signed   By: Nelson Chimes M.D.   On: 08/05/2017 10:17    EKG:   Orders placed or performed during the hospital encounter of 08/05/17  . ED EKG  . ED EKG    ASSESSMENT AND PLAN:   77 year old female patient admitted to medical service for ankle fracture, patient has right ankle trimalleolar fracture status post ORIF yesterday.  Patient has been having multiple falls in the last 6 months.  Patient tolerated the surgery well, now sitting in recliner.  Physical therapy recommended skilled nursing.  I spoke with husband who is a retired Chief Strategy Officer, he wanted her to go to [facility] and he already made arrangements for private pay.  Patient will be discharged tomorrow.  Continue pain medicines, I ordered incentive  spirometry.  2.  Metastatic breast cancer: Recent MRI of the brain did not show metastasis.  Patient had history of meningioma resection. 3.  Memory impairment, hearing loss: Follows up with Dr. Manuella Ghazi.  Discussed with husband, patient's daughter.  All the records are reviewed and case discussed with Care Management/Social Workerr. Management plans discussed with the patient, family and they are in agreement.  CODE STATUS: full  TOTAL TIME TAKING CARE OF THIS PATIENT: 35 minutes.   POSSIBLE D/C IN 1-2 DAYS, DEPENDING ON CLINICAL CONDITION.   Epifanio Lesches M.D on 08/06/2017 at 1:43 PM  Between 7am to 6pm - Pager - [phone]  After 6pm go to [email or web address] - password [email or web address] [facility]  Office  [phone]  CC: Primary care physician; [name], MD   Note: This dictation was prepared with Dragon dictation along with smaller phrase technology. Any transcriptional errors that result from this process are unintentional.

## 2017-08-06 NOTE — Progress Notes (Signed)
Subjective:Not much pain,  Oob in chair.  Slept well.    1 Day Post-Op Procedure(s) (LRB): OPEN REDUCTION INTERNAL FIXATION (ORIF) ANKLE FRACTURE (Right)    Patient reports pain as mild.  Objective:   VITALS:   Vitals:   08/06/17 0012 08/06/17 0431  BP: (!) 104/54 (!) 111/55  Pulse: 92 89  Resp:    Temp: 98.7 F (37.1 C) 98.1 F (36.7 C)  SpO2: 93% 94%    Neurologically intact ABD soft Neurovascular intact Sensation intact distally Intact pulses distally Dorsiflexion/Plantar flexion intact Incision: no drainage  LABS Recent Labs    08/05/17 0923  HGB 12.5  HCT 37.4  WBC 6.3  PLT 360    Recent Labs    08/05/17 0923 08/06/17 0441  NA 135 133*  K 3.5 4.2  BUN 15 15  CREATININE 0.86 0.69  GLUCOSE 122* 154*    Recent Labs    08/05/17 0924  INR 0.95     Assessment/Plan: 1 Day Post-Op Procedure(s) (LRB): OPEN REDUCTION INTERNAL FIXATION (ORIF) ANKLE FRACTURE (Right)   Advance diet Up with therapy D/C IV fluids Discharge home with home health

## 2017-08-07 DIAGNOSIS — S82841A Displaced bimalleolar fracture of right lower leg, initial encounter for closed fracture: Secondary | ICD-10-CM | POA: Diagnosis not present

## 2017-08-07 LAB — COMPREHENSIVE METABOLIC PANEL
ALK PHOS: 45 U/L (ref 38–126)
ALT: 14 U/L (ref 14–54)
ANION GAP: 8 (ref 5–15)
AST: 18 U/L (ref 15–41)
Albumin: 3.4 g/dL — ABNORMAL LOW (ref 3.5–5.0)
BUN: 20 mg/dL (ref 6–20)
CALCIUM: 8.2 mg/dL — AB (ref 8.9–10.3)
CO2: 25 mmol/L (ref 22–32)
CREATININE: 0.75 mg/dL (ref 0.44–1.00)
Chloride: 104 mmol/L (ref 101–111)
Glucose, Bld: 96 mg/dL (ref 65–99)
Potassium: 3.8 mmol/L (ref 3.5–5.1)
SODIUM: 137 mmol/L (ref 135–145)
Total Bilirubin: 0.5 mg/dL (ref 0.3–1.2)
Total Protein: 5.6 g/dL — ABNORMAL LOW (ref 6.5–8.1)

## 2017-08-07 MED ORDER — CELECOXIB 200 MG PO CAPS: 200.0000 mg | ORAL_CAPSULE | Freq: Two times a day (BID) | ORAL | 0 refills | Status: DC

## 2017-08-07 MED ORDER — HYDROCODONE-ACETAMINOPHEN 5-325 MG PO TABS: 1.0000 | ORAL_TABLET | ORAL | 0 refills | Status: DC | PRN

## 2017-08-07 MED ORDER — ASPIRIN 325 MG PO TBEC: 325.0000 mg | DELAYED_RELEASE_TABLET | Freq: Every day | ORAL | 0 refills | Status: DC

## 2017-08-07 MED ORDER — CELECOXIB 200 MG PO CAPS: 200.0000 mg | ORAL_CAPSULE | Freq: Every day | ORAL | 0 refills | Status: DC

## 2017-08-07 MED ORDER — BISACODYL 10 MG RE SUPP: 10.0000 mg | Freq: Every day | RECTAL | 0 refills | Status: DC | PRN

## 2017-08-07 MED ORDER — ASPIRIN EC 325 MG PO TBEC: 325.0000 mg | DELAYED_RELEASE_TABLET | Freq: Every day | ORAL | Status: DC

## 2017-08-07 MED ORDER — METHOCARBAMOL 500 MG PO TABS: 500.0000 mg | ORAL_TABLET | Freq: Four times a day (QID) | ORAL | 0 refills | Status: DC | PRN

## 2017-08-07 NOTE — Progress Notes (Signed)
Subjective: Appears to be doing better.  Out of bed in chair.  Pain is lessened.  Planning on going to skilled nursing facility at [facility].  Discharged on enteric-coated aspirin 1 p.o. daily 325 mg and Celebrex 200 mg daily. Dressings were changed.  Wounds look excellent.  Swelling was minimal and there is no redness or sign of infection.  A well-padded short-leg cast was applied over sterile dressings.  She will remain nonweightbearing.   Procedure(s) (LRB): OPEN REDUCTION INTERNAL FIXATION (ORIF) ANKLE FRACTURE (Right)    Patient reports pain as mild.  Objective:   VITALS:   Vitals:   08/07/17 0753 08/07/17 1231  BP: (!) 110/54   Pulse: 77 92  Resp: 18   Temp: (!) 97.4 F (36.3 C)   SpO2: 98% 96%    Neurologically intact ABD soft Neurovascular intact Sensation intact distally Intact pulses distally Dorsiflexion/Plantar flexion intact Incision: moderate drainage  LABS Recent Labs    08/05/17 0923  HGB 12.5  HCT 37.4  WBC 6.3  PLT 360    Recent Labs    08/05/17 0923 08/06/17 0441 08/07/17 0449  NA 135 133* 137  K 3.5 4.2 3.8  BUN 15 15 20   CREATININE 0.86 0.69 0.75  GLUCOSE 122* 154* 96    Recent Labs    08/05/17 0924  INR 0.95     Assessment/Plan: 2 Days Post-Op Procedure(s) (LRB): OPEN REDUCTION INTERNAL FIXATION (ORIF) ANKLE FRACTURE (Right)   Up with therapy Discharge to SNF   Return to my office in 12-14 days for cast change and staple removal Enteric coated aspirin 325 mg daily Celebrex 200 mg p.o. daily

## 2017-08-07 NOTE — Progress Notes (Signed)
Patient is medically stable for D/C to [facility] today. Per [facility] admissions coordinator at [facility] patient's husband Ronalee Belts met with her this morning and completed admissions paper work and made the payment. Per Sharyn Lull patient can come today to room 201-A. RN will call report at [phone] and arrange EMS for transport. Clinical Education officer, museum (CSW) sent D/C orders to [facility] via [employer]. Patient is aware of above. Patient's daughter Sharyn Lull is at bedside and aware of above. Please reconsult if future social work needs arise. CSW signing off.   [employer], LCSW [phone]

## 2017-08-07 NOTE — Discharge Summary (Signed)
[name], is a 77 y.o. female  DOB 1941/05/14  MRN [id number].  Admission date:  08/05/2017  Admitting Physician  Nicholes Mango, MD  Discharge Date:  08/07/2017   Primary MD  [name], MD  Recommendations for primary care physician for things to follow:   Follow with PCP in 2-3  weeks   follow-up with Dr. Earnestine Leys in 2 weeks   Admission Diagnosis  SOB (shortness of breath) [R06.02] Bimalleolar ankle fracture, right, closed, initial encounter [S82.841A] Ankle dislocation, right, initial encounter [S93.04XA] Closed right ankle fracture [S82.891A]   Discharge Diagnosis  SOB (shortness of breath) [R06.02] Bimalleolar ankle fracture, right, closed, initial encounter [Y63.785Y] Ankle dislocation, right, initial encounter [S93.04XA] Closed right ankle fracture [S82.891A]    Active Problems:   Ankle fracture, bimalleolar, closed, right, initial encounter   Closed right ankle fracture      Past Medical History:  Diagnosis Date  . Arthritis   . 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 Breast cancer ([place]) 2016  . Breast cancer of upper-inner quadrant of left female breast ([place]) 12/15/14   Completed radiation end of December and finished chemotherapy 2 weeks ago, Left breast invasive mammary carcinoma, T1cN17mc (1.5 cm); Grade 3, [name], [name], [name], .  .Marland KitchenCataract    bilat   . 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 Hearing loss   . History of cancer chemotherapy   . History of radiation therapy   . Imbalance   . Memory impairment    seen by Dr SManuella Ghazi possible post crainiotomy from radiation  . Numbness and tingling    right hand   . Shoulder pain, right   . Wears glasses     Past Surgical History:  Procedure Laterality Date  . APPENDECTOMY  1950   . BRAIN SURGERY  1995   left frontal/temporal  . BREAST BIOPSY Left 1997  . BREAST BIOPSY Left 12/15/14   confirmed DCIS  . BREAST BIOPSY Left 01/08/2015   Procedure: BREAST BIOPSY WITH NEEDLE LOCALIZATION;  Surgeon: [name] MD;  Location: [facility];  Service: General;  Laterality: Left;  . BREAST LUMPECTOMY Left 01/08/2015   Procedure: LUMPECTOMY;  Surgeon: [name] MD;  Location: [facility];  Service: General;  Laterality: Left;  . COLONOSCOPY  2010   Dr. ETiffany Kocher . ORIF ANKLE FRACTURE Right 08/05/2017   Procedure: OPEN REDUCTION INTERNAL FIXATION (ORIF) ANKLE FRACTURE;  Surgeon: MEarnestine Leys MD;  Location: [facility];  Service: Orthopedics;  Laterality: Right;  . PORTACATH PLACEMENT Right 01/16/2015   Procedure: INSERTION PORT-A-CATH;  Surgeon: [name] MD;  Location: [facility];  Service: General;  Laterality: Right;  . SENTINEL NODE BIOPSY Left 01/16/2015   Procedure: SENTINEL NODE BIOPSY;  Surgeon: [name] MD;  Location: [facility];  Service: General;  Laterality: Left;  . TOTAL HIP ARTHROPLASTY Right 09/04/2015   Procedure: RIGHT TOTAL HIP ARTHROPLASTY ANTERIOR APPROACH;  Surgeon: MParalee Cancel MD;  Location: [facility];  Service: Orthopedics;  Laterality: Right;       History of present illness and  Hospital Course:     Kindly see H&P for history of present illness and admission details, please review complete Labs, Consult reports and Test reports for all details in brief  HPI  from the history and physical done on the day of admission 77year old female patient admitted to hospital  because of fall and suffered  right trimalleolar ankle fracture, patient admitted for the same.   Hospital Course  ##1 ,acute right ankle fracture status post repair by orthopedic physician on January 9.  Patient tolerated the procedure well, postoperatively patient is on Robaxin, Norco, she participated in physical therapy yesterday.  Recommended skilled nursing.  Family  chose to go to rehab.  Patient's husband is retired Chief Strategy Officer.  Discussed with him the plan.  Finished postop antibiotics, Ancef.,  Clindamycin  2..  Metastatic breast cancer: Recent MRI of the brain did not show metastasis.  Patient had history of meningioma resection. 3.  Memory impairment, hearing loss: Follows up with Dr. Manuella Ghazi.     Discharge Condition: Stable   Follow UP   Contact information for follow-up providers    Earnestine Leys, MD. Schedule an appointment as soon as possible for a visit in 2 week(s).   Specialty:  Specialist Contact information: [place] [address] [phone]        Tama High III, MD. Schedule an appointment as soon as possible for a visit in 2 week(s).   Specialty:  Internal Medicine Contact information: [place] [address] [phone]            Contact information for after-discharge care    Destination    [facility] Follow up.   Service:  Skilled Nursing Contact information: [address] [place] [phone]                    Discharge Instructions  and  Discharge Medications      Allergies as of 08/07/2017      Reactions   Donepezil    Other reaction(s): Other (See Comments) Nightmares      Medication List    STOP taking these medications   naproxen sodium 220 MG tablet Commonly known as:  ALEVE   ondansetron 8 MG tablet Commonly known as:  ZOFRAN   traMADol 50 MG tablet Commonly known as:  ULTRAM     TAKE these medications   bisacodyl 10 MG suppository Commonly known as:  DULCOLAX Place 1 suppository (10 mg total) rectally daily as needed for moderate constipation.   CALTRATE 600+D PLUS MINERALS 600-800 MG-UNIT Tabs Take 1 tablet by mouth every morning.   celecoxib 200 MG capsule Commonly known as:  CELEBREX Take 1 capsule (200 mg total) by mouth every 12 (twelve)  hours.   ferrous sulfate 325 (65 FE) MG tablet Take 1 tablet (325 mg total) by mouth 3 (three) times daily after meals.   HYDROcodone-acetaminophen 5-325 MG tablet Commonly known as:  NORCO/VICODIN Take 1 tablet by mouth every 4 (four) hours as needed for moderate pain ((score 4 to 6)).   lidocaine-prilocaine cream Commonly known as:  EMLA Apply 1 application topically as needed.   loratadine 10 MG tablet Commonly known as:  CLARITIN Take 10 mg by mouth daily as needed.   methocarbamol 500 MG tablet Commonly known as:  ROBAXIN Take 1 tablet (500 mg total) by mouth every 6 (six) hours as needed for muscle spasms.   prochlorperazine 10 MG tablet Commonly known as:  COMPAZINE Take 1 tablet (10 mg total) every 6 (six) hours as needed by mouth for nausea or vomiting.   vitamin C 100 MG tablet Take 100 mg by mouth every morning.   vitamin E 400 UNIT capsule Take 400 Units by mouth daily.  Durable Medical Equipment  (From admission, onward)        Start     Ordered   08/05/17 2106  DME Walker rolling  Once    Question:  Patient needs a walker to treat with the following condition  Answer:  Trimalleolar fracture of ankle, closed, right, initial encounter   08/05/17 2105        Diet and Activity recommendation: See Discharge Instructions above   Consults obtained -physical therapy,   Major procedures and Radiology Reports - PLEASE review detailed and final reports for all details, in brief -      Dg Lumbar Spine 2-3 Views  Result Date: 08/05/2017 CLINICAL DATA:  [address] at home in the elevator.  Back pain. 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 No evidence of fracture or other focal lesion. IMPRESSION: Chronic lumbar degenerative changes.  No acute or traumatic finding. Electronically Signed   By: Nelson Chimes M.D.   On:  08/05/2017 10:23   Dg Ankle Complete Right  Result Date: 08/05/2017 CLINICAL DATA:  Continued surveillance of ankle deformity. EXAM: RIGHT ANKLE - COMPLETE 3+ VIEW COMPARISON:  None available. FINDINGS: No images were obtained before placement of the plaster cast. There is a comminuted and displaced distal fibular/lateral malleolar fracture. Transverse and displaced medial malleolar fracture. Widening of the ankle mortise. Plantar spur. IMPRESSION: Bimalleolar fracture, closed reduction. Electronically Signed   By: Staci Righter M.D.   On: 08/05/2017 10:19   Ct Chest W Contrast  Result Date: 07/31/2017 CLINICAL DATA:  Restaging of left breast cancer. Prior lumpectomy, chemotherapy, and radiation therapy. EXAM: CT CHEST, ABDOMEN, AND PELVIS WITH CONTRAST TECHNIQUE: Multidetector CT imaging of the chest, abdomen and pelvis was performed following the standard protocol during bolus administration of intravenous contrast. CONTRAST:  20m ISOVUE-300 IOPAMIDOL (ISOVUE-300) INJECTION 61%, 1073mISOVUE-300 IOPAMIDOL (ISOVUE-300) INJECTION 61% COMPARISON:  Multiple exams, including PET-CT dated 05/13/2017 FINDINGS: CT CHEST FINDINGS Cardiovascular: Atherosclerotic calcification of the thoracic aorta and branch vessels. Mediastinum/Nodes: Small thyroid nodules are likely benign. The left parasternal soft tissue mass shown on the prior exam is significantly reduced in size, previously 4.9 by 3.6 cm by my measurements in currently 2.1 by 2.1 cm on image 22/2 and of significantly lower density by my measurements. The previously hypermetabolic axillary adenopathy has essentially resolved. Prior upper mediastinal adenopathy has likewise essentially resolved. Lungs/Pleura: Left apical pleuroparenchymal scarring. Dependent subsegmental atelectasis. Likely therapy related findings in the left breast. Musculoskeletal: Previously hypermetabolic nodule inferiorly along the right breast is no longer readily apparent, with only a  faint bandlike density in this vicinity today. Degenerative glenohumeral arthropathy bilaterally. There is diffuse and increased sclerosis in the sternal manubrium compatible with prior tumor infiltration. Suspected pathologic fracture in the inferior portion of the sternal manubrium extending transversely. CT ABDOMEN PELVIS FINDINGS Hepatobiliary: Unremarkable Pancreas: Unremarkable Spleen: Unremarkable Adrenals/Urinary Tract: Pelvic floor laxity with small cystocele. The kidneys and adrenal glands appear unremarkable. Stomach/Bowel: Unremarkable Vascular/Lymphatic: Aortoiliac atherosclerotic vascular disease. The previously hypermetabolic portacaval lymph node measures 8 mm in diameter on image 62/2, formerly 10 mm. Reproductive: Calcified left uterine fibroid. Right adnexal cyst measures 4.1 by 3.9 cm, this was not previously hypermetabolic. Other: No supplemental non-categorized findings. Musculoskeletal: Pelvic floor laxity. Total right hip prosthesis. Moderate degenerative arthropathy of the left hip. IMPRESSION: 1. Prior thoracic and abdominal adenopathy has essentially resolved. Notably reduced size of the left parasternal soft  tissue mass extending into the mediastinum. The previously hypermetabolic right lower breast nodule has also essentially resolved. 2. Diffuse sclerosis throughout the sternal manubrium, likely representing response to therapy related to prior manubrial metastatic lesion. There is a suspected pathologic fracture along the inferior portion of the manubrium best seen on the parasagittal images. 3. 4 cm right adnexal cyst, not previously hypermetabolic. Cysts of this size in this age group typically warrant pelvic sonography for definitive characterization. 4. Other imaging findings of potential clinical significance: Pelvic floor laxity with small cystocele. Aortic Atherosclerosis (ICD10-I70.0). Calcified uterine fibroid. Electronically Signed   By: Van Clines M.D.   On:  07/31/2017 14:14   Ct Abdomen Pelvis W Contrast  Result Date: 07/31/2017 CLINICAL DATA:  Restaging of left breast cancer. Prior lumpectomy, chemotherapy, and radiation therapy. EXAM: CT CHEST, ABDOMEN, AND PELVIS WITH CONTRAST TECHNIQUE: Multidetector CT imaging of the chest, abdomen and pelvis was performed following the standard protocol during bolus administration of intravenous contrast. CONTRAST:  46m ISOVUE-300 IOPAMIDOL (ISOVUE-300) INJECTION 61%, 1021mISOVUE-300 IOPAMIDOL (ISOVUE-300) INJECTION 61% COMPARISON:  Multiple exams, including PET-CT dated 05/13/2017 FINDINGS: CT CHEST FINDINGS Cardiovascular: Atherosclerotic calcification of the thoracic aorta and branch vessels. Mediastinum/Nodes: Small thyroid nodules are likely benign. The left parasternal soft tissue mass shown on the prior exam is significantly reduced in size, previously 4.9 by 3.6 cm by my measurements in currently 2.1 by 2.1 cm on image 22/2 and of significantly lower density by my measurements. The previously hypermetabolic axillary adenopathy has essentially resolved. Prior upper mediastinal adenopathy has likewise essentially resolved. Lungs/Pleura: Left apical pleuroparenchymal scarring. Dependent subsegmental atelectasis. Likely therapy related findings in the left breast. Musculoskeletal: Previously hypermetabolic nodule inferiorly along the right breast is no longer readily apparent, with only a faint bandlike density in this vicinity today. Degenerative glenohumeral arthropathy bilaterally. There is diffuse and increased sclerosis in the sternal manubrium compatible with prior tumor infiltration. Suspected pathologic fracture in the inferior portion of the sternal manubrium extending transversely. CT ABDOMEN PELVIS FINDINGS Hepatobiliary: Unremarkable Pancreas: Unremarkable Spleen: Unremarkable Adrenals/Urinary Tract: Pelvic floor laxity with small cystocele. The kidneys and adrenal glands appear unremarkable. Stomach/Bowel:  Unremarkable Vascular/Lymphatic: Aortoiliac atherosclerotic vascular disease. The previously hypermetabolic portacaval lymph node measures 8 mm in diameter on image 62/2, formerly 10 mm. Reproductive: Calcified left uterine fibroid. Right adnexal cyst measures 4.1 by 3.9 cm, this was not previously hypermetabolic. Other: No supplemental non-categorized findings. Musculoskeletal: Pelvic floor laxity. Total right hip prosthesis. Moderate degenerative arthropathy of the left hip. IMPRESSION: 1. Prior thoracic and abdominal adenopathy has essentially resolved. Notably reduced size of the left parasternal soft tissue mass extending into the mediastinum. The previously hypermetabolic right lower breast nodule has also essentially resolved. 2. Diffuse sclerosis throughout the sternal manubrium, likely representing response to therapy related to prior manubrial metastatic lesion. There is a suspected pathologic fracture along the inferior portion of the manubrium best seen on the parasagittal images. 3. 4 cm right adnexal cyst, not previously hypermetabolic. Cysts of this size in this age group typically warrant pelvic sonography for definitive characterization. 4. Other imaging findings of potential clinical significance: Pelvic floor laxity with small cystocele. Aortic Atherosclerosis (ICD10-I70.0). Calcified uterine fibroid. Electronically Signed   By: WaVan Clines.D.   On: 07/31/2017 14:14   Dg Chest Port 1 View  Result Date: 08/05/2017 CLINICAL DATA:  Shortness of breath, former smoker. History of metastatic breast malignancy, has undergone radiation therapy and chemotherapy. EXAM: PORTABLE CHEST 1 VIEW COMPARISON:  Chest x-ray of June 22, 2017 and chest CT scan of July 31, 2017 FINDINGS: The lungs are adequately inflated and clear. The heart and pulmonary vascularity are normal. The mediastinum is normal in width. The power port catheter tip projects over the distal third of the SVC. The bony thorax  exhibits no acute abnormality. IMPRESSION: There is no acute cardiopulmonary abnormality. Electronically Signed   By: David  Martinique M.D.   On: 08/05/2017 12:14   Dg Hip Unilat W Or Wo Pelvis 2-3 Views Right  Result Date: 08/05/2017 CLINICAL DATA:  [address] at home in the elevator.  Hip pain. EXAM: DG HIP (WITH OR WITHOUT PELVIS) 2-3V RIGHT COMPARISON:  09/04/2015 FINDINGS: Previous total hip replacement on the right. Components appear well positioned. No sign of fracture or dislocation. No other right pelvic injury seen. Osteoarthritis of the left hip. IMPRESSION: No traumatic finding.  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 Electronically Signed   By: Nelson Chimes M.D.   On: 08/05/2017 10:17    Micro Results     Recent Results (from the past 240 hour(s))  MRSA PCR Screening     Status: None   Collection Time: 08/05/17  3:11 PM  Result Value Ref Range Status   MRSA by PCR NEGATIVE NEGATIVE Final    Comment:        The GeneXpert MRSA Assay (FDA approved for NASAL specimens only), is one component of a comprehensive MRSA colonization surveillance program. It is not intended to diagnose MRSA infection nor to guide or monitor treatment for MRSA infections. Performed at [facility], [address], [place], [place] [address]        Today   Subjective:   [name] today is stable for discharge. Objective:   Blood pressure 118/63, pulse 82, temperature 98.5 F (36.9 C), temperature source Oral, resp. rate 18, height _0  (1.6 m), weight 74.8 kg (165 lb), SpO2 98 %.   Intake/Output Summary (Last 24 hours) at 08/07/2017 0750 Last data filed at 08/06/2017 2129 Gross per 24 hour  Intake 960 ml  Output 500 ml  Net 460 ml    Exam Awake Alert, Oriented x 3, No new F.N deficits, Normal affect [place].AT,PERRAL Supple Neck,No JVD, No cervical lymphadenopathy appriciated.  Symmetrical Chest wall movement, Good air movement bilaterally, CTAB RRR,No Gallops,Rubs or new  Murmurs, No Parasternal Heave +ve B.Sounds, Abd Soft, Non tender, No organomegaly appriciated, No rebound -guarding or rigidity. No Cyanosis, Clubbing or edema, No new Rash or bruise  Data Review   CBC w Diff:  Lab Results  Component Value Date   WBC 6.3 08/05/2017   HGB 12.5 08/05/2017   HCT 37.4 08/05/2017   PLT 360 08/05/2017   LYMPHOPCT 16 08/05/2017   BANDSPCT 13 (H) 02/07/2015   MONOPCT 8 08/05/2017   EOSPCT 2 08/05/2017   BASOPCT 1 08/05/2017    CMP:  Lab Results  Component Value Date   NA 137 08/07/2017   K 3.8 08/07/2017   CL 104 08/07/2017   CO2 25 08/07/2017   BUN 20 08/07/2017   CREATININE 0.75 08/07/2017   PROT 5.6 (L) 08/07/2017   ALBUMIN 3.4 (L) 08/07/2017   BILITOT 0.5 08/07/2017   ALKPHOS 45 08/07/2017   AST 18 08/07/2017   ALT 14 08/07/2017  .   Total Time in preparing paper work, data evaluation and todays exam - 40 minutes  Epifanio Lesches M.D on 08/07/2017 at 7:50 AM    Note: This dictation was prepared with Dragon dictation along with smaller phrase  technology. Any transcriptional errors that result from this process are unintentional.

## 2017-08-07 NOTE — Anesthesia Postprocedure Evaluation (Signed)
Anesthesia Post Note  Patient: [name]  Procedure(s) Performed: OPEN REDUCTION INTERNAL FIXATION (ORIF) ANKLE FRACTURE (Right Ankle)  Patient location during evaluation: PACU Anesthesia Type: General Level of consciousness: awake and alert Pain management: pain level controlled Vital Signs Assessment: post-procedure vital signs reviewed and stable Respiratory status: spontaneous breathing, nonlabored ventilation, respiratory function stable and patient connected to nasal cannula oxygen Cardiovascular status: blood pressure returned to baseline and stable Postop Assessment: no apparent nausea or vomiting Anesthetic complications: no     Last Vitals:  Vitals:   08/07/17 0445 08/07/17 0753  BP: 118/63 (!) 110/54  Pulse: 82 77  Resp: 18 18  Temp: 36.9 C (!) 36.3 C  SpO2: 98% 98%    Last Pain:  Vitals:   08/07/17 0802  TempSrc:   PainSc: 5                  Martha Clan

## 2017-08-07 NOTE — Clinical Social Work Placement (Signed)
   CLINICAL SOCIAL WORK PLACEMENT  NOTE  Date:  08/07/2017  Patient Details  Name: [name] MRN: [id number] Date of Birth: 08/08/1940  Clinical Social Work is seeking post-discharge placement for this patient at the [place] level of care (*CSW will initial, date and re-position this form in  chart as items are completed):  Yes   Patient/family provided with [facility] Work Department's list of facilities offering this level of care within the geographic area requested by the patient (or if unable, by the patient's family).  Yes   Patient/family informed of their freedom to choose among providers that offer the needed level of care, that participate in Medicare, Medicaid or managed care program needed by the patient, have an available bed and are willing to accept the patient.  Yes   Patient/family informed of [facility]'s ownership interest in [facility] and [facility], as well as of the fact that they are under no obligation to receive care at these facilities.  PASRR submitted to EDS on 08/06/17     PASRR number received on 08/06/17     Existing PASRR number confirmed on       FL2 transmitted to all facilities in geographic area requested by pt/family on 08/06/17     FL2 transmitted to all facilities within larger geographic area on       Patient informed that his/her managed care company has contracts with or will negotiate with certain facilities, including the following:        Yes   Patient/family informed of bed offers received.  Patient chooses bed at [facility] )     Physician recommends and patient chooses bed at      Patient to be transferred to [facility] ) on 08/07/17.  Patient to be transferred to facility by [facility] )     Patient family notified on 08/07/17 of transfer.  Name of family member notified:  (Patient's daughter Sharyn Lull is at bedside and aware of D/C today. )      PHYSICIAN       Additional Comment:    _______________________________________________ Ailani Governale, Veronia Beets, LCSW 08/07/2017, 12:38 PM

## 2017-08-07 NOTE — Progress Notes (Signed)
Physical Therapy Treatment Patient Details Name: [name] MRN: [id number] DOB: [birth year] Today's Date: 08/07/2017    History of Present Illness Pt is a 77 y.o. female who was admitted to [facility] for ORIF repair of a TriMalleolar, comminuted Fracture, dislocation of the right ankle sustained during a fall. Pt. has had 3 falls in the lst 6 months. Pt. PMHx includes: Metastatic breast CA, Brain Tumor, Athritis, Cataracts, HOH, Right hand numbness, and tingling.      PT Comments    Pt presents with deficits in strength, transfers, mobility, gait, balance, and activity tolerance but is progressing towards goals.  Pt able to perform sit to/from stand transfers with cues for sequencing with good eccentric and concentric control while maintaining RLE NWB status.  Pt's standing tolerance continues to improve and pt was able to take 3-4 small hop-to steps this session with RLE elevated off the floor.  Pt does require extensive verbal cues for sequencing with transfers and gait but is able to follow commands well.  Pt will benefit from PT services in a SNF setting upon discharge to safely address above deficits for decreased caregiver assistance and eventual return to PLOF.     Follow Up Recommendations  SNF     Equipment Recommendations  None recommended by PT    Recommendations for Other Services       Precautions / Restrictions Precautions Precautions: Fall Restrictions Weight Bearing Restrictions: Yes RLE Weight Bearing: Non weight bearing    Mobility  Bed Mobility               General bed mobility comments: Pt in recliner this session  Transfers Overall transfer level: Needs assistance Equipment used: Rolling walker (2 wheeled) Transfers: Sit to/from Stand Sit to Stand: Min guard         General transfer comment: Good concentric and eccentric control during transfers with min verbal cues required for sequencing and to maintain RLE NWB  status  Ambulation/Gait Ambulation/Gait assistance: Min assist Ambulation Distance (Feet): 2 Feet Assistive device: Rolling walker (2 wheeled)     Gait velocity interpretation: <1.8 ft/sec, indicative of risk for recurrent falls General Gait Details: Hop-to gait with max verbal cues for sequencing with pt able to maintain RLE NWB status   Stairs            Wheelchair Mobility    Modified Rankin (Stroke Patients Only)       Balance Overall balance assessment: Needs assistance   Sitting balance-Leahy Scale: Normal     Standing balance support: Bilateral upper extremity supported Standing balance-Leahy Scale: Fair Standing balance comment: RLE NWB status maintained                            Cognition Arousal/Alertness: Awake/alert Behavior During Therapy: WFL for tasks assessed/performed Overall Cognitive Status: History of cognitive impairments - at baseline                                        Exercises Total Joint Exercises Ankle Circles/Pumps: AROM;Left;10 reps Quad Sets: Strengthening;Both;10 reps Gluteal Sets: Strengthening;Both;10 reps Hip ABduction/ADduction: AROM;Both;10 reps Straight Leg Raises: AROM;Both;10 reps Long Arc Quad: AROM;Both;10 reps Knee Flexion: AROM;Both;10 reps Other Exercises Other Exercises: LLE low amplitude mini squats 2 x 5    General Comments        Pertinent Vitals/Pain Pain Assessment: 0-10 Pain  Score: 5  Pain Location: R ankle Pain Descriptors / Indicators: Sore;Aching Pain Intervention(s): Premedicated before session;Monitored during session;Limited activity within patient's tolerance    Home Living                      Prior Function            PT Goals (current goals can now be found in the care plan section) Progress towards PT goals: Progressing toward goals    Frequency    BID      PT Plan Current plan remains appropriate    Co-evaluation               AM-PAC PT "6 Clicks" Daily Activity  Outcome Measure                   End of Session Equipment Utilized During Treatment: Gait belt Activity Tolerance: Patient tolerated treatment well Patient left: in chair;with call bell/phone within reach;with SCD's reapplied;Other (comment)(Per nsg, ok for no chair alarm) Nurse Communication: Mobility status PT Visit Diagnosis: Other abnormalities of gait and mobility (R26.89);Muscle weakness (generalized) (M62.81)     Time: 7218-2883 PT Time Calculation (min) (ACUTE ONLY): 24 min  Charges:  $Therapeutic Exercise: 8-22 mins $Therapeutic Activity: 8-22 mins                    G Codes:       DRoyetta Asal PT, DPT 08/07/17, 12:39 PM

## 2017-08-07 NOTE — Progress Notes (Signed)
EMS here to transport patient to [facility]. 

## 2017-08-07 NOTE — Progress Notes (Signed)
Pt ready for discharge to [facility]. Report called to facility. EMS called for transport. Family informed.

## 2017-08-07 NOTE — NC FL2 (Signed)
[place] LEVEL OF CARE SCREENING TOOL     IDENTIFICATION  Patient Name: [name] Birthdate: 12/30/1940 Sex: female Admission Date (Current Location): 08/05/2017  [place] and [place] Number:  Engineering geologist and Address:  [facility], [address], [place], [place] [address]      Provider Number: [id number]  Attending Physician Name and Address:  Epifanio Lesches, MD  Relative Name and Phone Number:       Current Level of Care: Hospital Recommended Level of Care: [place] Prior Approval Number:    Date Approved/Denied:   PASRR Number: [id number])  Discharge Plan: SNF    Current Diagnoses: Patient Active Problem List   Diagnosis Date Noted  . Ankle fracture, bimalleolar, closed, right, initial encounter 08/05/2017  . Closed right ankle fracture 08/05/2017  . Breast cancer metastasized to axillary lymph node, left ([place]) 06/03/2017  . Gait instability 05/14/2017  . Goals of care, counseling/discussion 05/14/2017  . Syncope 01/04/2017  . Carcinoma of overlapping sites of left breast in female, estrogen receptor negative ([place]) 01/23/2016  . S/P right THA, AA 09/04/2015  . History of artificial joint 09/04/2015  . 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 Neoplasm of meninges 07/18/2015  . DDD (degenerative disc disease), lumbar 01/02/2015  . HLD (hyperlipidemia) 01/02/2015  . Benign neoplasm of meninges ([place]) 01/02/2015  . Arthritis, degenerative 01/02/2015  . Psoriasis 01/02/2015  . Rotator cuff arthropathy 09/15/2012    Orientation RESPIRATION BLADDER Height & Weight     Self, Time, Situation, Place  Normal Continent Weight: 165 lb (74.8 kg) Height:  5\' 3"  (160 cm)  BEHAVIORAL SYMPTOMS/MOOD NEUROLOGICAL BOWEL NUTRITION STATUS      Continent Diet(Diet: Regular. )  AMBULATORY STATUS COMMUNICATION OF NEEDS Skin   Extensive Assist Verbally Surgical wounds(Incision: Right  Ankle. )                       Personal Care Assistance Level of Assistance  Bathing, Feeding, Dressing Bathing Assistance: Limited assistance Feeding assistance: Independent Dressing Assistance: Limited assistance     Functional Limitations Info  Sight, Hearing, Speech Sight Info: Adequate Hearing Info: Impaired Speech Info: Adequate    SPECIAL CARE FACTORS FREQUENCY  PT (By licensed PT), OT (By licensed OT)     PT Frequency: (5) OT Frequency: (5)            Contractures      Additional Factors Info  Code Status, Allergies Code Status Info: (Full Code. ) Allergies Info: (Donepezil)           Current Medications (08/07/2017):  This is the current hospital active medication list Current Facility-Administered Medications  Medication Dose Route Frequency Provider Last Rate Last Dose  . 0.45 % sodium chloride infusion   Intravenous Continuous Earnestine Leys, MD   Stopped at 08/06/17 1053  . acetaminophen (TYLENOL) tablet 650 mg  650 mg Oral Q4H PRN Earnestine Leys, MD       Or  . acetaminophen (TYLENOL) suppository 650 mg  650 mg Rectal Q4H PRN Earnestine Leys, MD      . bisacodyl (DULCOLAX) suppository 10 mg  10 mg Rectal Daily PRN Earnestine Leys, MD      . celecoxib (CELEBREX) capsule 200 mg  200 mg Oral Q12H Earnestine Leys, MD   200 mg at 08/06/17 2145  . enoxaparin (LOVENOX) injection 30 mg  30 mg Subcutaneous Q24H Earnestine Leys, MD   30 mg at  08/07/17 0802  . HYDROcodone-acetaminophen (NORCO/VICODIN) 5-325 MG per tablet 1 tablet  1 tablet Oral Q4H PRN Earnestine Leys, MD   1 tablet at 08/07/17 0802  . magnesium hydroxide (MILK OF MAGNESIA) suspension 30 mL  30 mL Oral Daily PRN Earnestine Leys, MD      . methocarbamol (ROBAXIN) tablet 500 mg  500 mg Oral Q6H PRN Earnestine Leys, MD       Or  . methocarbamol (ROBAXIN) 500 mg in dextrose 5 % 50 mL IVPB  500 mg Intravenous Q6H PRN Earnestine Leys, MD      . metoCLOPramide (REGLAN) tablet 5-10 mg  5-10 mg Oral Q8H PRN  Earnestine Leys, MD       Or  . metoCLOPramide (REGLAN) injection 5-10 mg  5-10 mg Intravenous Q8H PRN Earnestine Leys, MD      . morphine 2 MG/ML injection 1 mg  1 mg Intravenous Q2H PRN Earnestine Leys, MD      . ondansetron [facility]) tablet 4 mg  4 mg Oral Q6H PRN Earnestine Leys, MD       Or  . ondansetron [facility]) injection 4 mg  4 mg Intravenous Q6H PRN Earnestine Leys, MD      . [facility]) tablet 8.6 mg  1 tablet Oral BID Earnestine Leys, MD   8.6 mg at 08/06/17 2145  . sodium phosphate (FLEET) 7-19 GM/118ML enema 1 enema  1 enema Rectal Once PRN Earnestine Leys, MD      . vitamin E capsule 400 Units  400 Units Oral Daily Earnestine Leys, MD   400 Units at 08/06/17 1054     Discharge Medications: Please see discharge summary for a list of discharge medications.  Relevant Imaging Results:  Relevant Lab Results:   Additional Information (SSN: [id number])  Aneyah Lortz, Veronia Beets, LCSW

## 2017-08-09 ENCOUNTER — Other Ambulatory Visit
Admission: RE | Admit: 2017-08-09 | Discharge: 2017-08-09 | Disposition: A | Discharge status: Home / Self Care | Payer: Medicare Other | Source: Skilled Nursing Facility | Attending: Internal Medicine | Admitting: Internal Medicine

## 2017-08-09 DIAGNOSIS — R350 Frequency of micturition: Secondary | ICD-10-CM | POA: Diagnosis present

## 2017-08-09 DIAGNOSIS — R3915 Urgency of urination: Secondary | ICD-10-CM | POA: Diagnosis present

## 2017-08-09 LAB — URINALYSIS, COMPLETE (UACMP) WITH MICROSCOPIC
BILIRUBIN URINE: NEGATIVE
Bacteria, UA: NONE SEEN
GLUCOSE, UA: NEGATIVE mg/dL
HGB URINE DIPSTICK: NEGATIVE
KETONES UR: NEGATIVE mg/dL
LEUKOCYTES UA: NEGATIVE
NITRITE: NEGATIVE
PH: 6 (ref 5.0–8.0)
Protein, ur: NEGATIVE mg/dL
Specific Gravity, Urine: 1.009 (ref 1.005–1.030)

## 2017-08-11 LAB — URINE CULTURE: Culture: <10000 — AB

## 2017-08-18 ENCOUNTER — Encounter: Payer: Self-pay | Admitting: Internal Medicine

## 2017-08-19 ENCOUNTER — Encounter: Payer: Self-pay | Admitting: Gerontology

## 2017-08-19 ENCOUNTER — Non-Acute Institutional Stay (SKILLED_NURSING_FACILITY): Payer: Medicare Other | Admitting: Gerontology

## 2017-08-19 DIAGNOSIS — S82891D Other fracture of right lower leg, subsequent encounter for closed fracture with routine healing: Secondary | ICD-10-CM | POA: Diagnosis not present

## 2017-08-19 NOTE — Progress Notes (Signed)
Location:   [facility] Room Number: [place] of Service:  SNF [phone]) Provider:  Toni Arthurs, NP-C  [name], MD  Patient Care Team: [name], MD as PCP - General (Internal Medicine) Bary Castilla, [name], MD (General Surgery) Requested, Self  Extended Emergency Contact Information Primary Emergency Contact: Xaniyah, Buchholz Address: [address]          [place], [place] [address] Johnnette Litter of [place] Phone: [phone] Mobile Phone: [phone] Relation: Spouse Secondary Emergency Contact: Evelena Leyden [place] of [place] Mobile Phone: [phone] Relation: Son  Code Status:  FULL Goals of care: Advanced Directive information Advanced Directives 08/19/2017  Does Patient Have a Medical Advance Directive? Yes  Type of Paramedic of [place];Living will  Does patient want to make changes to medical advance directive? No - Patient declined  Copy of [place] in Chart? Yes  Would patient like information on creating a medical advance directive? -     Chief Complaint  Patient presents with  . Medical Management of Chronic Issues    Routine Visit    HPI:  Pt is a 77 y.o. female seen today for medical management of chronic diseases.  Patient was admitted to the facility for rehab followin gadmission to [facility] after mechanical fall and Right ankle fracture. Pt underwent an ORIF of the right ankle. Pt has been participating in PT and OT. Pt has been progressing well. Pt reports pain is well controlled on current regimen. Pt reports her appetite is good. She is voiding without difficulty and having regular BMs. Soft cast in place. Calves soft, supple. Negative homan's sign. VSS. No other complaints.      Past Medical History:  Diagnosis Date  . Arthritis   . 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 Breast cancer ([place]) 2016   left breast; surgery 01/11/15 with Dr.  Bary Castilla; path with invasive mammary and DCIS  . Breast cancer of upper-inner quadrant of left female breast ([place]) 12/15/14   Completed radiation end of December and finished chemotherapy 2 weeks ago, Left breast invasive mammary carcinoma, [id number] (1.5 cm); Grade 3, [name], [name], [name], .  .Marland KitchenCataract    bilat   . DDD (degenerative disc disease), lumbar    Lumbar, previously evaluated by Dr. HEarnestine Leys . Dementia   . Fibrocystic breast disease    prior biopsy  . Glaucoma   . 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 Hearing loss   . History of cancer chemotherapy   . History of radiation therapy   . Hyperlipidemia, unspecified   . Imbalance   . Memory impairment    seen by Dr SManuella Ghazi possible post crainiotomy from radiation  . [place]    Left cavernous sinus meningioma, [facility]  . Numbness and tingling    right hand   . Osteoarthritis   . Osteoporosis, post-menopausal   . Shoulder pain, right   . Wears glasses    Past Surgical History:  Procedure Laterality Date  . APPENDECTOMY  1950  . BRAIN SURGERY  1995   left frontal/temporal  . BREAST BIOPSY Left 1997  . BREAST BIOPSY Left 12/15/14   confirmed DCIS  . BREAST BIOPSY Left 01/08/2015   Procedure: BREAST BIOPSY WITH NEEDLE LOCALIZATION;  Surgeon: [name]  MD;  Location: [facility] ORS;  Service: General;  Laterality: Left;  . BREAST LUMPECTOMY Left 01/08/2015   Procedure: LUMPECTOMY;  Surgeon: [name], MD;  Location: [facility] ORS;  Service: General;  Laterality: Left;  . COLONOSCOPY  2010   Dr. [name]  . ORIF ANKLE FRACTURE Right 08/05/2017   Procedure: OPEN REDUCTION INTERNAL FIXATION (ORIF) ANKLE FRACTURE;  Surgeon: Earnestine Leys, MD;  Location: [facility] ORS;  Service: Orthopedics;  Laterality: Right;  . PORTACATH PLACEMENT Right 01/16/2015   Procedure: INSERTION PORT-A-CATH;  Surgeon: [name],  MD;  Location: [facility] ORS;  Service: General;  Laterality: Right;  . SENTINEL NODE BIOPSY Left 01/16/2015   Procedure: SENTINEL NODE BIOPSY;  Surgeon: [name], MD;  Location: [facility] ORS;  Service: General;  Laterality: Left;  . TOTAL HIP ARTHROPLASTY Right 09/04/2015   Procedure: RIGHT TOTAL HIP ARTHROPLASTY ANTERIOR APPROACH;  Surgeon: [name], MD;  Location: [facility]S;  Service: Orthopedics;  Laterality: Right;    Allergies  Allergen Reactions  . Donepezil     Other reaction(s): Other (See Comments) Nightmares    Allergies as of 08/19/2017      Reactions   Donepezil    Other reaction(s): Other (See Comments) Nightmares      Medication List        Accurate as of 08/19/17 12:46 PM. Always use your most recent med list.          acetaminophen 325 MG tablet Commonly known as:  TYLENOL Take 650 mg by mouth every 4 (four) hours as needed. Pain / increased temp.   aspirin 325 MG EC tablet Take 325 mg by mouth 2 (two) times daily.   bisacodyl 10 MG suppository Commonly known as:  DULCOLAX Place 1 suppository (10 mg total) rectally daily as needed for moderate constipation.   CALTRATE 600+D PLUS MINERALS 600-800 MG-UNIT Tabs Take 1 tablet by mouth every morning.   celecoxib 200 MG capsule Commonly known as:  CELEBREX Take 1 capsule (200 mg total) by mouth daily.   HYDROcodone-acetaminophen 5-325 MG tablet Commonly known as:  NORCO/VICODIN Take 1 tablet by mouth every 4 (four) hours as needed for moderate pain ((score 4 to 6)).   loratadine 10 MG tablet Commonly known as:  CLARITIN Take 10 mg by mouth daily as needed.   methocarbamol 500 MG tablet Commonly known as:  ROBAXIN Take 1 tablet (500 mg total) by mouth every 6 (six) hours as needed for muscle spasms.   pantoprazole 40 MG tablet Commonly known as:  PROTONIX Take 40 mg by mouth daily.   prochlorperazine 10 MG tablet Commonly known as:  COMPAZINE Take 1 tablet (10 mg total) every 6 (six) hours as  needed by mouth for nausea or vomiting.   senna 8.6 MG tablet Commonly known as:  SENOKOT Take 1 tablet by mouth 2 (two) times daily.   vitamin C 250 MG tablet Commonly known as:  ASCORBIC ACID Take 250 mg by mouth daily.   Vitamin D-3 5000 units Tabs Take 1 tablet by mouth daily.   vitamin E 400 UNIT capsule Take 400 Units by mouth daily.       Review of Systems  Constitutional: Negative for activity change, appetite change, chills, diaphoresis and fever.  HENT: Negative for congestion, mouth sores, nosebleeds, postnasal drip, sneezing, sore throat, trouble swallowing and voice change.   Respiratory: Negative for apnea, cough, choking, chest tightness, shortness of breath and wheezing.   Cardiovascular: Negative for chest pain, palpitations and leg swelling.  Gastrointestinal: Negative for abdominal distention, abdominal pain, constipation, diarrhea and nausea.  Genitourinary: Negative for difficulty urinating, dysuria, frequency and urgency.  Musculoskeletal: Positive for arthralgias (typical arthritis). Negative for back pain, gait problem and myalgias.  Skin: Negative for color change, pallor, rash and wound.  Neurological: Positive for weakness. Negative for dizziness, tremors, syncope, speech difficulty, numbness and headaches.  Psychiatric/Behavioral: Negative for agitation and behavioral problems.  All other systems reviewed and are negative.   Immunization History  Administered Date(s) Administered  . Influenza Split 03/28/2014  . Influenza,inj,Quad PF,6+ Mos 04/27/2016  . Influenza-Unspecified 05/01/2017  . Pneumococcal Conjugate-13 11/27/2015  . Pneumococcal Polysaccharide-23 06/02/2013   Pertinent  Health Maintenance Due  Topic Date Due  . COLONOSCOPY  05/18/1992  . DEXA SCAN  05/19/2007  . INFLUENZA VACCINE  Completed  . PNA vac Low Risk Adult  Completed   Fall Risk  06/17/2017 06/04/2017 05/21/2017 08/28/2016 02/22/2016  Falls in the past year? No No No No  No  Comment - - - - -   Functional Status Survey:    Vitals:   08/19/17 1212  BP: 109/68  Pulse: 80  Resp: 17  Temp: 97.7 F (36.5 C)  TempSrc: Oral  SpO2: 97%  Weight: 165 lb 3.2 oz (74.9 kg)  Height: _0  (1.6 m)   Body mass index is 29.26 kg/m. Physical Exam  Constitutional: She is oriented to person, place, and time. Vital signs are normal. She appears well-developed and well-nourished. She is active and cooperative. She does not appear ill. No distress.  HENT:  Head: Normocephalic and atraumatic.  Mouth/Throat: Uvula is midline, oropharynx is clear and moist and mucous membranes are normal. Mucous membranes are not pale, not dry and not cyanotic.  Eyes: Conjunctivae, EOM and lids are normal. Pupils are equal, round, and reactive to light.  Neck: Trachea normal, normal range of motion and full passive range of motion without pain. Neck supple. No JVD present. No tracheal deviation, no edema and no erythema present. No thyromegaly present.  Cardiovascular: Normal rate, regular rhythm, normal heart sounds, intact distal pulses and normal pulses. Exam reveals no gallop, no distant heart sounds and no friction rub.  No murmur heard. Pulses:      Dorsalis pedis pulses are 2+ on the right side, and 2+ on the left side.  No edema  Pulmonary/Chest: Effort normal and breath sounds normal. No accessory muscle usage. No respiratory distress. She has no decreased breath sounds. She has no wheezes. She has no rhonchi. She has no rales. She exhibits no tenderness.  Abdominal: Soft. Normal appearance and bowel sounds are normal. She exhibits no distension and no ascites. There is no tenderness.  Musculoskeletal: She exhibits no edema or tenderness.       Right ankle: She exhibits decreased range of motion, swelling and laceration.  Expected osteoarthritis, stiffness; Bilateral Calves soft, supple. Negative Homan's Sign. B- pedal pulses equal  Neurological: She is alert and oriented to  person, place, and time. She has normal strength.  Skin: Skin is warm and dry. Laceration noted. She is not diaphoretic. No cyanosis. No pallor. Nails show no clubbing.  Psychiatric: She has a normal mood and affect. Her speech is normal and behavior is normal. Judgment and thought content normal. Cognition and memory are normal.  Nursing note and vitals reviewed.   Labs reviewed: Recent Labs    08/05/17 0923 08/06/17 0441 08/07/17 0449  NA 135 133* 137  K 3.5 4.2 3.8  CL 100* 101 104  CO2 _0 GLUCOSE 122* 154* 96  BUN _1 CREATININE 0.86 0.69 0.75  CALCIUM 8.8* 8.4* 8.2*   Recent Labs    08/05/17 0923 08/06/17 0441 08/07/17 0449  AST _2 ALT _3 ALKPHOS 55 50 45  BILITOT 0.6 0.5 0.5  PROT 6.6 6.2* 5.6*  ALBUMIN 4.0 3.5 3.4*   Recent Labs    07/15/17 1016 07/22/17 1124 08/05/17 0923  WBC 4.3 3.8 6.3  NEUTROABS 3.2 2.7 4.6  HGB 12.3 12.2 12.5  HCT 37.3 36.9 37.4  MCV 93.0 92.8 92.8  PLT 355 335 360   No results found for: TSH No results found for: HGBA1C No results found for: CHOL, HDL, LDLCALC, LDLDIRECT, TRIG, CHOLHDL  Significant Diagnostic Results in last 30 days:  Dg Lumbar Spine 2-3 Views  Result Date: 08/05/2017 CLINICAL DATA:  [address] at home in the elevator.  Back pain. 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 No evidence of fracture or other focal lesion. IMPRESSION: Chronic lumbar degenerative changes.  No acute or traumatic finding. Electronically Signed   By: Nelson Chimes M.D.   On: 08/05/2017 10:23   Dg Ankle Complete Right  Result Date: 08/05/2017 CLINICAL DATA:  Continued surveillance of ankle deformity. EXAM: RIGHT ANKLE - COMPLETE 3+ VIEW COMPARISON:  None available. FINDINGS: No images were obtained before placement of the plaster cast. There is a comminuted and displaced distal  fibular/lateral malleolar fracture. Transverse and displaced medial malleolar fracture. Widening of the ankle mortise. Plantar spur. IMPRESSION: Bimalleolar fracture, closed reduction. Electronically Signed   By: Staci Righter M.D.   On: 08/05/2017 10:19   Ct Chest W Contrast  Result Date: 07/31/2017 CLINICAL DATA:  Restaging of left breast cancer. Prior lumpectomy, chemotherapy, and radiation therapy. EXAM: CT CHEST, ABDOMEN, AND PELVIS WITH CONTRAST TECHNIQUE: Multidetector CT imaging of the chest, abdomen and pelvis was performed following the standard protocol during bolus administration of intravenous contrast. CONTRAST:  18m ISOVUE-300 IOPAMIDOL (ISOVUE-300) INJECTION 61%, 1062mISOVUE-300 IOPAMIDOL (ISOVUE-300) INJECTION 61% COMPARISON:  Multiple exams, including PET-CT dated 05/13/2017 FINDINGS: CT CHEST FINDINGS Cardiovascular: Atherosclerotic calcification of the thoracic aorta and branch vessels. Mediastinum/Nodes: Small thyroid nodules are likely benign. The left parasternal soft tissue mass shown on the prior exam is significantly reduced in size, previously 4.9 by 3.6 cm by my measurements in currently 2.1 by 2.1 cm on image 22/2 and of significantly lower density by my measurements. The previously hypermetabolic axillary adenopathy has essentially resolved. Prior upper mediastinal adenopathy has likewise essentially resolved. Lungs/Pleura: Left apical pleuroparenchymal scarring. Dependent subsegmental atelectasis. Likely therapy related findings in the left breast. Musculoskeletal: Previously hypermetabolic nodule inferiorly along the right breast is no longer readily apparent, with only a faint bandlike density in this vicinity today. Degenerative glenohumeral arthropathy bilaterally. There is diffuse and increased sclerosis in the sternal manubrium compatible with prior tumor infiltration. Suspected pathologic fracture in the inferior portion of the sternal manubrium extending transversely. CT  ABDOMEN PELVIS FINDINGS Hepatobiliary: Unremarkable Pancreas: Unremarkable Spleen: Unremarkable Adrenals/Urinary Tract: Pelvic floor laxity with small cystocele. The kidneys and adrenal glands appear unremarkable. Stomach/Bowel: Unremarkable Vascular/Lymphatic: Aortoiliac atherosclerotic vascular disease. The previously hypermetabolic portacaval lymph node measures 8 mm in diameter on image 62/2, formerly 10 mm. Reproductive: Calcified left uterine fibroid. Right adnexal cyst measures 4.1 by 3.9 cm, this was not previously hypermetabolic. Other: No supplemental  non-categorized findings. Musculoskeletal: Pelvic floor laxity. Total right hip prosthesis. Moderate degenerative arthropathy of the left hip. IMPRESSION: 1. Prior thoracic and abdominal adenopathy has essentially resolved. Notably reduced size of the left parasternal soft tissue mass extending into the mediastinum. The previously hypermetabolic right lower breast nodule has also essentially resolved. 2. Diffuse sclerosis throughout the sternal manubrium, likely representing response to therapy related to prior manubrial metastatic lesion. There is a suspected pathologic fracture along the inferior portion of the manubrium best seen on the parasagittal images. 3. 4 cm right adnexal cyst, not previously hypermetabolic. Cysts of this size in this age group typically warrant pelvic sonography for definitive characterization. 4. Other imaging findings of potential clinical significance: Pelvic floor laxity with small cystocele. Aortic Atherosclerosis (ICD10-I70.0). Calcified uterine fibroid. Electronically Signed   By: Van Clines M.D.   On: 07/31/2017 14:14   Ct Abdomen Pelvis W Contrast  Result Date: 07/31/2017 CLINICAL DATA:  Restaging of left breast cancer. Prior lumpectomy, chemotherapy, and radiation therapy. EXAM: CT CHEST, ABDOMEN, AND PELVIS WITH CONTRAST TECHNIQUE: Multidetector CT imaging of the chest, abdomen and pelvis was performed  following the standard protocol during bolus administration of intravenous contrast. CONTRAST:  52m ISOVUE-300 IOPAMIDOL (ISOVUE-300) INJECTION 61%, 1030mISOVUE-300 IOPAMIDOL (ISOVUE-300) INJECTION 61% COMPARISON:  Multiple exams, including PET-CT dated 05/13/2017 FINDINGS: CT CHEST FINDINGS Cardiovascular: Atherosclerotic calcification of the thoracic aorta and branch vessels. Mediastinum/Nodes: Small thyroid nodules are likely benign. The left parasternal soft tissue mass shown on the prior exam is significantly reduced in size, previously 4.9 by 3.6 cm by my measurements in currently 2.1 by 2.1 cm on image 22/2 and of significantly lower density by my measurements. The previously hypermetabolic axillary adenopathy has essentially resolved. Prior upper mediastinal adenopathy has likewise essentially resolved. Lungs/Pleura: Left apical pleuroparenchymal scarring. Dependent subsegmental atelectasis. Likely therapy related findings in the left breast. Musculoskeletal: Previously hypermetabolic nodule inferiorly along the right breast is no longer readily apparent, with only a faint bandlike density in this vicinity today. Degenerative glenohumeral arthropathy bilaterally. There is diffuse and increased sclerosis in the sternal manubrium compatible with prior tumor infiltration. Suspected pathologic fracture in the inferior portion of the sternal manubrium extending transversely. CT ABDOMEN PELVIS FINDINGS Hepatobiliary: Unremarkable Pancreas: Unremarkable Spleen: Unremarkable Adrenals/Urinary Tract: Pelvic floor laxity with small cystocele. The kidneys and adrenal glands appear unremarkable. Stomach/Bowel: Unremarkable Vascular/Lymphatic: Aortoiliac atherosclerotic vascular disease. The previously hypermetabolic portacaval lymph node measures 8 mm in diameter on image 62/2, formerly 10 mm. Reproductive: Calcified left uterine fibroid. Right adnexal cyst measures 4.1 by 3.9 cm, this was not previously  hypermetabolic. Other: No supplemental non-categorized findings. Musculoskeletal: Pelvic floor laxity. Total right hip prosthesis. Moderate degenerative arthropathy of the left hip. IMPRESSION: 1. Prior thoracic and abdominal adenopathy has essentially resolved. Notably reduced size of the left parasternal soft tissue mass extending into the mediastinum. The previously hypermetabolic right lower breast nodule has also essentially resolved. 2. Diffuse sclerosis throughout the sternal manubrium, likely representing response to therapy related to prior manubrial metastatic lesion. There is a suspected pathologic fracture along the inferior portion of the manubrium best seen on the parasagittal images. 3. 4 cm right adnexal cyst, not previously hypermetabolic. Cysts of this size in this age group typically warrant pelvic sonography for definitive characterization. 4. Other imaging findings of potential clinical significance: Pelvic floor laxity with small cystocele. Aortic Atherosclerosis (ICD10-I70.0). Calcified uterine fibroid. Electronically Signed   By: WaVan Clines.D.   On: 07/31/2017 14:14   [facility]  Result Date: 08/05/2017 CLINICAL DATA:  Shortness of breath, former smoker. History of metastatic breast malignancy, has undergone radiation therapy and chemotherapy. EXAM: PORTABLE CHEST 1 VIEW COMPARISON:  Chest x-ray of June 22, 2017 and chest CT scan of July 31, 2017 FINDINGS: The lungs are adequately inflated and clear. The heart and pulmonary vascularity are normal. The mediastinum is normal in width. The power port catheter tip projects over the distal third of the SVC. The bony thorax exhibits no acute abnormality. IMPRESSION: There is no acute cardiopulmonary abnormality. Electronically Signed   By: David  Martinique M.D.   On: 08/05/2017 12:14   Dg Hip Unilat W Or Wo Pelvis 2-3 Views Right  Result Date: 08/05/2017 CLINICAL DATA:  [address] at home in the elevator.  Hip pain. EXAM: DG  HIP (WITH OR WITHOUT PELVIS) 2-3V RIGHT COMPARISON:  09/04/2015 FINDINGS: Previous total hip replacement on the right. Components appear well positioned. No sign of fracture or dislocation. No other right pelvic injury seen. Osteoarthritis of the left hip. IMPRESSION: No traumatic finding.  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 Electronically Signed   By: Nelson Chimes M.D.   On: 08/05/2017 10:17    Assessment/Plan Closed fracture of right ankle with routine healing, subsequent encounter  Continue PT/OT  Continue exercises as taught by PT/OT  Ice to the ankle as much as tolerate for pain, edema  Elevate leg on pillows when at rest  Continue ASA 325 mg po BID for DVT prophylaxis  Continue Methocarbamol 500 mg po Q 6 hours prn spasms, cramps  Continue Norco 5/325 mg po Q 4 hours prn pain  Skin care and splint care per protocol  Follow up with Orthopedist as instructed for continuity of care  Family/ staff Communication:   Total Time:  Documentation:  Face to Face:  Family/Phone:   Labs/tests ordered: cbc, met c   Medication list reviewed and assessed for continued appropriateness. Monthly medication orders reviewed and signed.  Vikki Ports, NP-C Geriatrics [facility] [phone], [place] [address] Cell Phone (Mon-Fri 8am-5pm):  [phone] On Call:  [phone] & follow prompts after 5pm & weekends Office Phone:  [phone] Office Fax:  [phone]

## 2017-08-20 ENCOUNTER — Inpatient Hospital Stay: Payer: Medicare Other | Attending: Internal Medicine

## 2017-08-20 ENCOUNTER — Inpatient Hospital Stay: Payer: Medicare Other

## 2017-08-20 ENCOUNTER — Inpatient Hospital Stay (HOSPITAL_BASED_OUTPATIENT_CLINIC_OR_DEPARTMENT_OTHER): Payer: Medicare Other | Admitting: Internal Medicine

## 2017-08-20 ENCOUNTER — Encounter: Payer: Self-pay | Admitting: Internal Medicine

## 2017-08-20 VITALS — BP 110/66 | HR 79 | Temp 97.6°F

## 2017-08-20 DIAGNOSIS — Z171 Estrogen receptor negative status [ER-]: Secondary | ICD-10-CM

## 2017-08-20 DIAGNOSIS — F0390 Unspecified dementia without behavioral disturbance: Secondary | ICD-10-CM | POA: Diagnosis not present

## 2017-08-20 DIAGNOSIS — C50812 Malignant neoplasm of overlapping sites of left female breast: Secondary | ICD-10-CM

## 2017-08-20 DIAGNOSIS — Z5112 Encounter for antineoplastic immunotherapy: Secondary | ICD-10-CM | POA: Insufficient documentation

## 2017-08-20 LAB — COMPREHENSIVE METABOLIC PANEL
ALBUMIN: 3.7 g/dL (ref 3.5–5.0)
ALT: 19 U/L (ref 14–54)
ANION GAP: 10 (ref 5–15)
AST: 26 U/L (ref 15–41)
Alkaline Phosphatase: 58 U/L (ref 38–126)
BUN: 18 mg/dL (ref 6–20)
CALCIUM: 8.7 mg/dL — AB (ref 8.9–10.3)
CHLORIDE: 101 mmol/L (ref 101–111)
CO2: 26 mmol/L (ref 22–32)
Creatinine, Ser: 0.82 mg/dL (ref 0.44–1.00)
GFR calc non Af Amer: >60 mL/min (ref >60)
Glucose, Bld: 132 mg/dL — ABNORMAL HIGH (ref 65–99)
POTASSIUM: 4 mmol/L (ref 3.5–5.1)
SODIUM: 137 mmol/L (ref 135–145)
Total Bilirubin: 0.4 mg/dL (ref 0.3–1.2)
Total Protein: 6.5 g/dL (ref 6.5–8.1)

## 2017-08-20 LAB — CBC WITH DIFFERENTIAL/PLATELET
BASOS PCT: 0 %
Basophils Absolute: 0 10*3/uL (ref 0–0.1)
Eosinophils Absolute: 0.2 10*3/uL (ref 0–0.7)
Eosinophils Relative: 3 %
HEMATOCRIT: 34.4 % — AB (ref 35.0–47.0)
HEMOGLOBIN: 11.4 g/dL — AB (ref 12.0–16.0)
LYMPHS ABS: 0.8 10*3/uL — AB (ref 1.0–3.6)
LYMPHS PCT: 11 %
MCH: 30.3 pg (ref 26.0–34.0)
MCHC: 33.2 g/dL (ref 32.0–36.0)
MCV: 91.3 fL (ref 80.0–100.0)
Monocytes Absolute: 0.3 10*3/uL (ref 0.2–0.9)
Monocytes Relative: 5 %
NEUTROS ABS: 5.5 10*3/uL (ref 1.4–6.5)
NEUTROS PCT: 81 %
Platelets: 367 10*3/uL (ref 150–440)
RBC: 3.77 MIL/uL — ABNORMAL LOW (ref 3.80–5.20)
RDW: 15.6 % — ABNORMAL HIGH (ref 11.5–14.5)
WBC: 6.9 10*3/uL (ref 3.6–11.0)

## 2017-08-20 MED ORDER — HEPARIN SOD (PORK) LOCK FLUSH 100 UNIT/ML IV SOLN
500.0000 [IU] | Freq: Once | INTRAVENOUS | Status: AC | PRN
  Administered 2017-08-20: 500 [IU]
  Filled 2017-08-20: qty 5

## 2017-08-20 MED ORDER — SODIUM CHLORIDE 0.9 % IV SOLN
Freq: Once | INTRAVENOUS | Status: AC
  Administered 2017-08-20: 12:00:00 via INTRAVENOUS
  Filled 2017-08-20: qty 1000

## 2017-08-20 MED ORDER — TRASTUZUMAB CHEMO 150 MG IV SOLR
450.0000 mg | Freq: Once | INTRAVENOUS | Status: AC
  Administered 2017-08-20: 450 mg via INTRAVENOUS
  Filled 2017-08-20: qty 21.43

## 2017-08-20 MED ORDER — SODIUM CHLORIDE 0.9 % IV SOLN
420.0000 mg | Freq: Once | INTRAVENOUS | Status: AC
  Administered 2017-08-20: 420 mg via INTRAVENOUS
  Filled 2017-08-20: qty 14

## 2017-08-20 MED ORDER — DIPHENHYDRAMINE HCL 50 MG/ML IJ SOLN
12.5000 mg | Freq: Once | INTRAMUSCULAR | Status: AC
  Administered 2017-08-20: 12.5 mg via INTRAVENOUS
  Filled 2017-08-20: qty 1

## 2017-08-20 MED ORDER — ACETAMINOPHEN 325 MG PO TABS
650.0000 mg | ORAL_TABLET | Freq: Once | ORAL | Status: AC
  Administered 2017-08-20: 650 mg via ORAL
  Filled 2017-08-20: qty 2

## 2017-08-20 NOTE — Assessment & Plan Note (Addendum)
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  CT scan Jan 2019 shows partial response.  # Proceed with cycle #4 H-P only.  Discontinue Taxol. CBC CMP are reviewed; adequate today; no contraindications to chemotherapy. Proceed with treatment today.   # ? Syncope/falls- wil call if MRI is okay; and will order MRI-if okay with orthopedics.  # Dementia-/chronic debility/ataxia- STABLE.   #Follow-up in 3 weeks labs Herceptin perjeta.  

## 2017-08-20 NOTE — Progress Notes (Signed)
[place] OFFICE PROGRESS NOTE  Patient Care Team: [name], [name], MD (General Surgery) Requested, Self  Breast cancer metastasized to axillary lymph node [facility])   Staging form: Breast, [name], [name], [name], MD on 01/29/2015    Oncology History   # June 2016- LEFT BREAST CA;  [name] ; [name] ; [name]; Her 2 Neu POS; [place] from July OF 2016; [name]; adjuvant Herceptin [ Finished July 2017]; AUG 2017- Neratinib x5 days; DISCON sec to diarrhea  # MID OCT 2018- Right breast mass-Bx- ER/PR-NEG; Her 2 NEU POSITIVE 1~2.5cm;  [?NEW primary]  # MID-OCT 2018-METASTATIC RECURRENT-oh sternal mass; Left Ax LN [Bx]/periportal LN  # OCT 25th 2018- TAXOL-HERCEPTIN-PERJETA; Jan 2019- CT PR; continue HP only.    -------------------------------------------------------------------------------   # MUGA scan- July 28th 2017- 67%.  # chronic gait/balance issues  # June 2017- left breast Bx- fat necrosis [[name]  # july 2017-  BRCA 1& 2- NEG.   MOLECULAR TESTING- F ONE- TPS- 0%; pending; ERB2 amplification; PI3K/RET amplification Others**     Carcinoma of overlapping sites of left breast in female, estrogen receptor negative ([place])    INTERVAL HISTORY: Patient a poor historian given mild dementia.   [name] 77 y.o.  female pleasant patient above history of  recurrent/metastatic breast cancer ER/[name] HER-2/neu positive currently status post cycle #3 of Taxol Herceptin perjeta is here for follow-up/reviewed the results of the restaging CAT scan.  The interim patient fell; question syncope in home/elevator.  Unfortunately had a broken right ankle.  For which she had ORIF.  She is currently in rehab.  Patient denies any headaches.  Denies any  nausea vomiting.  Denies any particular reason for her syncope.  Overall she feels good.  No skin rash. No fever chills. No tingling, numbness-continues to have "restless legs".   REVIEW OF SYSTEMS:  A complete 10 point review of system is done which is negative except mentioned above/history of present illness.   PAST MEDICAL HISTORY :  Past Medical History:  Diagnosis Date  . Arthritis   . 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 Breast cancer ([place]) 2016   left breast; surgery 01/11/15 with Dr. Bary Castilla; path with invasive mammary and DCIS  . Breast cancer of upper-inner quadrant of left female breast ([place]) 12/15/14   Completed radiation end of December and finished chemotherapy 2 weeks ago, Left breast invasive mammary carcinoma, T1cN66mc (1.5 cm); Grade 3, [name], [name], [name], .  .Marland KitchenCataract    bilat   . DDD (degenerative disc disease), lumbar    Lumbar, previously evaluated by Dr. HEarnestine Leys . Dementia   . Fibrocystic breast disease    prior biopsy  . Glaucoma   . 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 Hearing loss   . History of cancer chemotherapy   . History of radiation therapy   . Hyperlipidemia, unspecified   . Imbalance   . Memory impairment    seen by Dr SManuella Ghazi possible post crainiotomy from radiation  . [place]    Left cavernous sinus meningioma, [facility].  .Marland Kitchen  Numbness and tingling    right hand   . Osteoarthritis   . Osteoporosis, post-menopausal   . Shoulder pain, right   . Wears glasses     PAST SURGICAL HISTORY :   Past Surgical History:  Procedure Laterality Date  . APPENDECTOMY  1950  . BRAIN SURGERY  1995   left frontal/temporal  . BREAST BIOPSY Left 1997  . BREAST BIOPSY Left 12/15/14   confirmed DCIS  . BREAST BIOPSY Left 01/08/2015   Procedure: BREAST BIOPSY WITH NEEDLE LOCALIZATION;  Surgeon: [name], MD;  Location: [facility] ORS;   Service: General;  Laterality: Left;  . BREAST LUMPECTOMY Left 01/08/2015   Procedure: LUMPECTOMY;  Surgeon: [name], MD;  Location: [facility] ORS;  Service: General;  Laterality: Left;  . COLONOSCOPY  2010   Dr. [name]  . ORIF ANKLE FRACTURE Right 08/05/2017   Procedure: OPEN REDUCTION INTERNAL FIXATION (ORIF) ANKLE FRACTURE;  Surgeon: Earnestine Leys, MD;  Location: [facility] ORS;  Service: Orthopedics;  Laterality: Right;  . PORTACATH PLACEMENT Right 01/16/2015   Procedure: INSERTION PORT-A-CATH;  Surgeon: [name], MD;  Location: [facility] ORS;  Service: General;  Laterality: Right;  . SENTINEL NODE BIOPSY Left 01/16/2015   Procedure: SENTINEL NODE BIOPSY;  Surgeon: [name], MD;  Location: [facility] ORS;  Service: General;  Laterality: Left;  . TOTAL HIP ARTHROPLASTY Right 09/04/2015   Procedure: RIGHT TOTAL HIP ARTHROPLASTY ANTERIOR APPROACH;  Surgeon: [name], MD;  Location: [facility];  Service: Orthopedics;  Laterality: Right;    FAMILY HISTORY :   Family History  Problem Relation Age of Onset  . Breast cancer Sister 39  . Addison's disease Mother   . Hyperthyroidism Mother   . Osteoarthritis Mother   . Stroke Father   . Heart disease Father   . High blood pressure Father     SOCIAL HISTORY:   Social History   Tobacco Use  . Smoking status: Former Smoker    Packs/day: 0.25    Years: 5.00    Pack years: 1.25    Types: Cigarettes    Last attempt to quit: 07/28/1972    Years since quitting: 45.1  . Smokeless tobacco: Never Used  Substance Use Topics  . Alcohol use: Yes    Alcohol/week: 0.6 - 1.2 oz    Types: 1 - 2 Glasses of wine per week    Comment: 1 Glass Wine / Night  . Drug use: No    ALLERGIES:  is allergic to donepezil.  MEDICATIONS:  Current Outpatient Medications  Medication Sig Dispense Refill  . acetaminophen (TYLENOL) 325 MG tablet Take 650 mg by mouth every 4 (four) hours as needed. Pain / increased temp.    . Calcium Carbonate-Vit D-Min  (CALTRATE 600+D PLUS MINERALS) 600-800 MG-UNIT TABS Take 1 tablet by mouth every morning.    . Cholecalciferol (VITAMIN D-3) 5000 units TABS Take 1 tablet by mouth daily.    Marland Kitchen HYDROcodone-acetaminophen (NORCO/VICODIN) 5-325 MG tablet Take 1 tablet by mouth every 4 (four) hours as needed for moderate pain ((score 4 to 6)). 30 tablet 0  . vitamin C (ASCORBIC ACID) 250 MG tablet Take 250 mg by mouth daily.    . vitamin E 400 UNIT capsule Take 400 Units by mouth daily.     Marland Kitchen aspirin 325 MG EC tablet Take 325 mg by mouth 2 (two) times daily.    . bisacodyl (DULCOLAX) 10 MG suppository Place 1 suppository (10 mg total) rectally daily as needed for  moderate constipation. 12 suppository 0  . celecoxib (CELEBREX) 200 MG capsule Take 1 capsule (200 mg total) by mouth daily. (Patient not taking: Reported on 08/20/2017) 30 capsule 0  . loratadine (CLARITIN) 10 MG tablet Take 10 mg by mouth daily as needed.     . methocarbamol (ROBAXIN) 500 MG tablet Take 1 tablet (500 mg total) by mouth every 6 (six) hours as needed for muscle spasms. (Patient not taking: Reported on 08/20/2017) 30 tablet 0  . pantoprazole (PROTONIX) 40 MG tablet Take 40 mg by mouth daily.    . prochlorperazine (COMPAZINE) 10 MG tablet Take 1 tablet (10 mg total) every 6 (six) hours as needed by mouth for nausea or vomiting. (Patient not taking: Reported on 08/20/2017) 40 tablet 1  . senna (SENOKOT) 8.6 MG tablet Take 1 tablet by mouth 2 (two) times daily.     No current facility-administered medications for this visit.     PHYSICAL EXAMINATION: ECOG PERFORMANCE STATUS: 1 - Symptomatic but completely ambulatory  BP 110/66 (BP Location: Left Arm, Patient Position: Sitting)   Pulse 79   Temp 97.6 F (36.4 C)   There were no vitals filed for this visit.  GENERAL: Well-nourished well-developed; Alert, no distress and comfortable.  With her husband.  In a wheelchair. EYES: no pallor or icterus OROPHARYNX: no thrush or ulceration; good  dentition  NECK: supple, no masses felt LYMPH:  palpable lymphadenopathy Left supraclavicular region ; left cervical region. Adenopathy felt in the left axillary/improved. LUNGS: clear to auscultation and  No wheeze or crackles HEART/CVS: regular rate & rhythm and no murmurs; No lower extremity edema; right lower extremity Ace wrap. ABDOMEN:abdomen soft, non-tender and normal bowel sounds Musculoskeletal:no cyanosis of digits and no clubbing; 1 cm raised lesion noted in the sternal area/improved. PSYCH: alert & oriented x 3 with fluent speech NEURO: no focal motor/sensory deficits SKIN:  no rashes or significant lesions.   LABORATORY DATA:  I have reviewed the data as listed    Component Value Date/Time   NA 137 08/20/2017 0946   K 4.0 08/20/2017 0946   CL 101 08/20/2017 0946   CO2 26 08/20/2017 0946   GLUCOSE 132 (H) 08/20/2017 0946   BUN 18 08/20/2017 0946   CREATININE 0.82 08/20/2017 0946   CALCIUM 8.7 (L) 08/20/2017 0946   PROT 6.5 08/20/2017 0946   ALBUMIN 3.7 08/20/2017 0946   AST 26 08/20/2017 0946   ALT 19 08/20/2017 0946   ALKPHOS 58 08/20/2017 0946   BILITOT 0.4 08/20/2017 0946   GFRNONAA >60 08/20/2017 0946   GFRAA >60 08/20/2017 0946    No results found for: SPEP, UPEP  Lab Results  Component Value Date   WBC 6.9 08/20/2017   NEUTROABS 5.5 08/20/2017   HGB 11.4 (L) 08/20/2017   HCT 34.4 (L) 08/20/2017   MCV 91.3 08/20/2017   PLT 367 08/20/2017      Chemistry      Component Value Date/Time   NA 137 08/20/2017 0946   K 4.0 08/20/2017 0946   CL 101 08/20/2017 0946   CO2 26 08/20/2017 0946   BUN 18 08/20/2017 0946   CREATININE 0.82 08/20/2017 0946      Component Value Date/Time   CALCIUM 8.7 (L) 08/20/2017 0946   ALKPHOS 58 08/20/2017 0946   AST 26 08/20/2017 0946   ALT 19 08/20/2017 0946   BILITOT 0.4 08/20/2017 0946     No  focal wall motion abnormality of the left ventricle. Calculated left ventricular ejection fraction equals  67%.  Previously 61% on 11/13/2015 IMPRESSION: Left ventricular ejection fraction equals 67 %. Electronically Signed   By: Suzy Bouchard M.D.   On: 02/20/2016 15:02 -----------------------------------------------     IMPRESSION: 1. Prior thoracic and abdominal adenopathy has essentially resolved. Notably reduced size of the left parasternal soft tissue mass extending into the mediastinum. The previously hypermetabolic right lower breast nodule has also essentially resolved. 2. Diffuse sclerosis throughout the sternal manubrium, likely representing response to therapy related to prior manubrial metastatic lesion. There is a suspected pathologic fracture along the inferior portion of the manubrium best seen on the parasagittal images. 3. 4 cm right adnexal cyst, not previously hypermetabolic. Cysts of this size in this age group typically warrant pelvic sonography for definitive characterization. 4. Other imaging findings of potential clinical significance: Pelvic floor laxity with small cystocele. Aortic Atherosclerosis (ICD10-I70.0). Calcified uterine fibroid.  RADIOGRAPHIC STUDIES: I have personally reviewed the radiological images as listed and agreed with the findings in the report. No results found.   ASSESSMENT & PLAN:  Carcinoma of overlapping sites of left breast in female, estrogen receptor negative ([place]) # Metastatic ER/[name] HER-2/neu positive- currently on Taxol Herceptin perjeta; she is currently status post cycle #3.  CT scan Jan 2019 shows partial response.  # Proceed with cycle #4 H-P only.  Discontinue Taxol. CBC CMP are reviewed; adequate today; no contraindications to chemotherapy. Proceed with treatment today.   # ? Syncope/falls- wil call if MRI is okay; and will order MRI-if okay with orthopedics.  # Dementia-/chronic debility/ataxia- STABLE.   #Follow-up in 3 weeks labs Herceptin perjeta.   No orders of the defined types were placed in this  encounter.  All questions were answered. The patient knows to call the clinic with any problems, questions or concerns.      Cammie Sickle, MD 08/25/2017 5:37 PM

## 2017-08-21 ENCOUNTER — Encounter: Payer: Self-pay | Admitting: *Deleted

## 2017-08-21 ENCOUNTER — Encounter: Payer: Self-pay | Admitting: Internal Medicine

## 2017-08-21 DIAGNOSIS — S82851A Displaced trimalleolar fracture of right lower leg, initial encounter for closed fracture: Secondary | ICD-10-CM | POA: Insufficient documentation

## 2017-08-26 ENCOUNTER — Encounter: Payer: Self-pay | Admitting: Internal Medicine

## 2017-08-26 ENCOUNTER — Other Ambulatory Visit: Payer: Self-pay | Admitting: Internal Medicine

## 2017-08-26 DIAGNOSIS — Z171 Estrogen receptor negative status [ER-]: Secondary | ICD-10-CM

## 2017-08-26 DIAGNOSIS — C50812 Malignant neoplasm of overlapping sites of left female breast: Secondary | ICD-10-CM

## 2017-08-26 DIAGNOSIS — R2681 Unsteadiness on feet: Secondary | ICD-10-CM

## 2017-08-26 NOTE — Progress Notes (Signed)
Please scheudle MRI ASAP.

## 2017-08-28 ENCOUNTER — Encounter
Admission: RE | Admit: 2017-08-28 | Discharge: 2017-08-28 | Disposition: A | Discharge status: Home / Self Care | Payer: Medicare Other | Source: Ambulatory Visit | Attending: Internal Medicine | Admitting: Internal Medicine

## 2017-08-28 ENCOUNTER — Encounter: Payer: Self-pay | Admitting: Gerontology

## 2017-08-28 ENCOUNTER — Non-Acute Institutional Stay (SKILLED_NURSING_FACILITY): Payer: Medicare Other | Admitting: Gerontology

## 2017-08-28 DIAGNOSIS — S82891D Other fracture of right lower leg, subsequent encounter for closed fracture with routine healing: Secondary | ICD-10-CM | POA: Diagnosis not present

## 2017-08-28 NOTE — Progress Notes (Signed)
Location:   [facility] Room Number: [place] of Service:  SNF [id number]) Provider:  Toni Arthurs, NP-C  [name], MD  Patient Care Team: [name], MD as PCP - General (Internal Medicine) Bary Castilla, [name], MD (General Surgery) Requested, Self  Extended Emergency Contact Information Primary Emergency Contact: Gulianna, [place] Address: [address]          [place], [place] [address] Johnnette Litter of [place] Phone: [phone] Mobile Phone: [phone] Relation: Spouse Secondary Emergency Contact: Evelena Leyden [place] of [place] Mobile Phone: [phone] Relation: Son  Code Status:  FULL Goals of care: Advanced Directive information Advanced Directives 08/28/2017  Does Patient Have a Medical Advance Directive? Yes  Type of Paramedic of [place];Living will  Does patient want to make changes to medical advance directive? No - Patient declined  Copy of [place] in Chart? Yes  Would patient like information on creating a medical advance directive? -     Chief Complaint  Patient presents with  . Medical Management of Chronic Issues    Routine Visit    HPI:  Pt is a 77 y.o. female seen today for medical management of chronic diseases. Pt was admitted to the facility for rehab following hospitalization at [facility] for right ankle fracture with ORIF after fall at home. Fiberglass cast in place. Cap refills WNL. Toes warm. Pt has been participating in PT/OT. Pt is progressing well. Pt verbalizes pain is well controlled with current regimen. Eating well, voiding well and having regular BMs. VSS. No other complaints.        Past Medical History:  Diagnosis Date  . Arthritis   . 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 Breast cancer ([place]) 2016   left breast; surgery 01/11/15 with Dr. Bary Castilla; path with invasive mammary and DCIS  . 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  .Marland KitchenCataract    bilat   . DDD (degenerative disc disease), lumbar    Lumbar, previously evaluated by Dr. HEarnestine Leys . Dementia   . Fibrocystic breast disease    prior biopsy  . Glaucoma   . 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 Hearing loss   . History of cancer chemotherapy   . History of radiation therapy   . Hyperlipidemia, unspecified   . Imbalance   . Memory impairment    seen by Dr SManuella Ghazi possible post crainiotomy from radiation  . [place]    Left cavernous sinus meningioma[facility]  . Numbness and tingling    right hand   . Osteoarthritis   . Osteoporosis, post-menopausal   . Shoulder pain, right   . Wears glasses    Past Surgical History:  Procedure Laterality Date  . APPENDECTOMY  1950  . BRAIN SURGERY  1995   left frontal/temporal  . BREAST BIOPSY Left 1997  . BREAST BIOPSY Left 12/15/14   confirmed DCIS  . BREAST BIOPSY Left 01/08/2015   Procedure: BREAST BIOPSY WITH NEEDLE LOCALIZATION;  Surgeon: [name] MD;  Location: [facility] ORS;  Service: General;  Laterality: Left;  . BREAST LUMPECTOMY  Left 01/08/2015   Procedure: LUMPECTOMY;  Surgeon: [name], MD;  Location: [facility] ORS;  Service: General;  Laterality: Left;  . COLONOSCOPY  2010   Dr. [name]  . ORIF ANKLE FRACTURE Right 08/05/2017   Procedure: OPEN REDUCTION INTERNAL FIXATION (ORIF) ANKLE FRACTURE;  Surgeon: Earnestine Leys, MD;  Location: [facility] ORS;  Service: Orthopedics;  Laterality: Right;  . PORTACATH PLACEMENT Right 01/16/2015   Procedure: INSERTION PORT-A-CATH;  Surgeon: [name], MD;  Location: [facility] ORS;  Service: General;  Laterality: Right;  . SENTINEL NODE BIOPSY  Left 01/16/2015   Procedure: SENTINEL NODE BIOPSY;  Surgeon: [name], MD;  Location: [facility] ORS;  Service: General;  Laterality: Left;  . TOTAL HIP ARTHROPLASTY Right 09/04/2015   Procedure: RIGHT TOTAL HIP ARTHROPLASTY ANTERIOR APPROACH;  Surgeon: [name], MD;  Location: [facility];  Service: Orthopedics;  Laterality: Right;    Allergies  Allergen Reactions  . Donepezil     Other reaction(s): Other (See Comments) Nightmares    Allergies as of 08/28/2017      Reactions   Donepezil    Other reaction(s): Other (See Comments) Nightmares      Medication List        Accurate as of 08/28/17  3:56 PM. Always use your most recent med list.          acetaminophen 325 MG tablet Commonly known as:  TYLENOL Take 650 mg by mouth every 4 (four) hours as needed. Pain / increased temp.   aspirin 325 MG EC tablet Take 325 mg by mouth 2 (two) times daily.   bisacodyl 10 MG suppository Commonly known as:  DULCOLAX Place 1 suppository (10 mg total) rectally daily as needed for moderate constipation.   CALTRATE 600+D PLUS MINERALS 600-800 MG-UNIT Tabs Take 1 tablet by mouth every morning.   celecoxib 200 MG capsule Commonly known as:  CELEBREX Take 200 mg by mouth 2 (two) times daily.   HYDROcodone-acetaminophen 5-325 MG tablet Commonly known as:  NORCO/VICODIN Take 1 tablet by mouth every 4 (four) hours as needed for moderate pain ((score 4 to 6)).   loratadine 10 MG tablet Commonly known as:  CLARITIN Take 10 mg by mouth daily as needed.   methocarbamol 500 MG tablet Commonly known as:  ROBAXIN Take 1 tablet (500 mg total) by mouth every 6 (six) hours as needed for muscle spasms.   pantoprazole 40 MG tablet Commonly known as:  PROTONIX Take 40 mg by mouth daily.   prochlorperazine 10 MG tablet Commonly known as:  COMPAZINE Take 1 tablet (10 mg total) every 6 (six) hours as needed by mouth for nausea or vomiting.   senna 8.6 MG tablet Commonly known as:  SENOKOT Take  1 tablet by mouth 2 (two) times daily.   vitamin C 250 MG tablet Commonly known as:  ASCORBIC ACID Take 250 mg by mouth daily.   Vitamin D-3 5000 units Tabs Take 1 tablet by mouth daily.   vitamin E 400 UNIT capsule Take 400 Units by mouth daily.       Review of Systems  Constitutional: Negative for activity change, appetite change, chills, diaphoresis and fever.  HENT: Negative for congestion, mouth sores, nosebleeds, postnasal drip, sneezing, sore throat, trouble swallowing and voice change.   Respiratory: Negative for apnea, cough, choking, chest tightness, shortness of breath and wheezing.   Cardiovascular: Negative for chest pain, palpitations and leg swelling.  Gastrointestinal: Negative for abdominal distention, abdominal pain, constipation, diarrhea and nausea.  Genitourinary:  Negative for difficulty urinating, dysuria, frequency and urgency.  Musculoskeletal: Positive for arthralgias (typical arthritis) and gait problem. Negative for back pain and myalgias.  Skin: Negative for color change, pallor, rash and wound.  Neurological: Negative for dizziness, tremors, syncope, speech difficulty, weakness, numbness and headaches.  Psychiatric/Behavioral: Negative for agitation and behavioral problems.  All other systems reviewed and are negative.   Immunization History  Administered Date(s) Administered  . Influenza Split 03/28/2014  . Influenza,inj,Quad PF,6+ Mos 04/27/2016  . Influenza-Unspecified 05/01/2017  . Pneumococcal Conjugate-13 11/27/2015  . Pneumococcal Polysaccharide-23 06/02/2013   Pertinent  Health Maintenance Due  Topic Date Due  . COLONOSCOPY  05/18/1992  . DEXA SCAN  05/19/2007  . INFLUENZA VACCINE  Completed  . PNA vac Low Risk Adult  Completed   Fall Risk  06/17/2017 06/04/2017 05/21/2017 08/28/2016 02/22/2016  Falls in the past year? No No No No No  Comment - - - - -   Functional Status Survey:    Vitals:   08/28/17 1549  BP: 112/68  Pulse: 74    Resp: 20  Temp: (!) 97.3 F (36.3 C)  TempSrc: Oral  SpO2: 97%  Weight: 163 lb 9.6 oz (74.2 kg)  Height: 5' 3"  (1.6 m)   Body mass index is 28.98 kg/m. Physical Exam  Constitutional: She is oriented to person, place, and time. Vital signs are normal. She appears well-developed and well-nourished. She is active and cooperative. She does not appear ill. No distress.  HENT:  Head: Normocephalic and atraumatic.  Mouth/Throat: Uvula is midline, oropharynx is clear and moist and mucous membranes are normal. Mucous membranes are not pale, not dry and not cyanotic.  Eyes: Conjunctivae, EOM and lids are normal. Pupils are equal, round, and reactive to light.  Neck: Trachea normal, normal range of motion and full passive range of motion without pain. Neck supple. No JVD present. No tracheal deviation, no edema and no erythema present. No thyromegaly present.  Cardiovascular: Normal rate, regular rhythm, normal heart sounds, intact distal pulses and normal pulses. Exam reveals no gallop, no distant heart sounds and no friction rub.  No murmur heard. Pulses:      Dorsalis pedis pulses are 2+ on the right side, and 2+ on the left side.  No edema  Pulmonary/Chest: Effort normal and breath sounds normal. No accessory muscle usage. No respiratory distress. She has no decreased breath sounds. She has no wheezes. She has no rhonchi. She has no rales. She exhibits no tenderness.  Abdominal: Soft. Normal appearance and bowel sounds are normal. She exhibits no distension and no ascites. There is no tenderness.  Musculoskeletal: She exhibits no edema or tenderness.       Right ankle: She exhibits decreased range of motion.  Expected osteoarthritis, stiffness; Bilateral Calves soft, supple. Negative Homan's Sign. B- pedal pulses equal; fiberglas cast in place  Neurological: She is alert and oriented to person, place, and time. She has normal strength. Gait abnormal.  Skin: Skin is warm, dry and intact. She is  not diaphoretic. No cyanosis. No pallor. Nails show no clubbing.  Psychiatric: She has a normal mood and affect. Her speech is normal and behavior is normal. Judgment and thought content normal. Cognition and memory are normal.  Nursing note and vitals reviewed.   Labs reviewed: Recent Labs    08/06/17 0441 08/07/17 0449 08/20/17 0946  NA 133* 137 137  K 4.2 3.8 4.0  CL 101 104 101  CO2 24 25 26   GLUCOSE 154* 96 132*  BUN 15 20 18   CREATININE 0.69 0.75 0.82  CALCIUM 8.4* 8.2* 8.7*   Recent Labs    08/06/17 0441 08/07/17 0449 08/20/17 0946  AST 23 18 26   ALT 23 14 19   ALKPHOS 50 45 58  BILITOT 0.5 0.5 0.4  PROT 6.2* 5.6* 6.5  ALBUMIN 3.5 3.4* 3.7   Recent Labs    07/22/17 1124 08/05/17 0923 08/20/17 0946  WBC 3.8 6.3 6.9  NEUTROABS 2.7 4.6 5.5  HGB 12.2 12.5 11.4*  HCT 36.9 37.4 34.4*  MCV 92.8 92.8 91.3  PLT 335 360 367   No results found for: TSH No results found for: HGBA1C No results found for: CHOL, HDL, LDLCALC, LDLDIRECT, TRIG, CHOLHDL  Significant Diagnostic Results in last 30 days:  Dg Lumbar Spine 2-3 Views  Result Date: 08/05/2017 CLINICAL DATA:  [address] at home in the elevator.  Back pain. 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 No evidence of fracture or other focal lesion. IMPRESSION: Chronic lumbar degenerative changes.  No acute or traumatic finding. Electronically Signed   By: Nelson Chimes M.D.   On: 08/05/2017 10:23   Dg Ankle Complete Right  Result Date: 08/05/2017 CLINICAL DATA:  Continued surveillance of ankle deformity. EXAM: RIGHT ANKLE - COMPLETE 3+ VIEW COMPARISON:  None available. FINDINGS: No images were obtained before placement of the plaster cast. There is a comminuted and displaced distal fibular/lateral malleolar fracture. Transverse and displaced medial malleolar fracture. Widening of the ankle  mortise. Plantar spur. IMPRESSION: Bimalleolar fracture, closed reduction. Electronically Signed   By: Staci Righter M.D.   On: 08/05/2017 10:19   Ct Chest W Contrast  Result Date: 07/31/2017 CLINICAL DATA:  Restaging of left breast cancer. Prior lumpectomy, chemotherapy, and radiation therapy. EXAM: CT CHEST, ABDOMEN, AND PELVIS WITH CONTRAST TECHNIQUE: Multidetector CT imaging of the chest, abdomen and pelvis was performed following the standard protocol during bolus administration of intravenous contrast. CONTRAST:  76m ISOVUE-300 IOPAMIDOL (ISOVUE-300) INJECTION 61%, 1073mISOVUE-300 IOPAMIDOL (ISOVUE-300) INJECTION 61% COMPARISON:  Multiple exams, including PET-CT dated 05/13/2017 FINDINGS: CT CHEST FINDINGS Cardiovascular: Atherosclerotic calcification of the thoracic aorta and branch vessels. Mediastinum/Nodes: Small thyroid nodules are likely benign. The left parasternal soft tissue mass shown on the prior exam is significantly reduced in size, previously 4.9 by 3.6 cm by my measurements in currently 2.1 by 2.1 cm on image 22/2 and of significantly lower density by my measurements. The previously hypermetabolic axillary adenopathy has essentially resolved. Prior upper mediastinal adenopathy has likewise essentially resolved. Lungs/Pleura: Left apical pleuroparenchymal scarring. Dependent subsegmental atelectasis. Likely therapy related findings in the left breast. Musculoskeletal: Previously hypermetabolic nodule inferiorly along the right breast is no longer readily apparent, with only a faint bandlike density in this vicinity today. Degenerative glenohumeral arthropathy bilaterally. There is diffuse and increased sclerosis in the sternal manubrium compatible with prior tumor infiltration. Suspected pathologic fracture in the inferior portion of the sternal manubrium extending transversely. CT ABDOMEN PELVIS FINDINGS Hepatobiliary: Unremarkable Pancreas: Unremarkable Spleen: Unremarkable  Adrenals/Urinary Tract: Pelvic floor laxity with small cystocele. The kidneys and adrenal glands appear unremarkable. Stomach/Bowel: Unremarkable Vascular/Lymphatic: Aortoiliac atherosclerotic vascular disease. The previously hypermetabolic portacaval lymph node measures 8 mm in diameter on image 62/2, formerly 10 mm. Reproductive: Calcified left uterine fibroid. Right adnexal cyst measures 4.1 by 3.9 cm, this was not previously hypermetabolic. Other: No supplemental non-categorized findings. Musculoskeletal: Pelvic floor laxity. Total right hip prosthesis.  Moderate degenerative arthropathy of the left hip. IMPRESSION: 1. Prior thoracic and abdominal adenopathy has essentially resolved. Notably reduced size of the left parasternal soft tissue mass extending into the mediastinum. The previously hypermetabolic right lower breast nodule has also essentially resolved. 2. Diffuse sclerosis throughout the sternal manubrium, likely representing response to therapy related to prior manubrial metastatic lesion. There is a suspected pathologic fracture along the inferior portion of the manubrium best seen on the parasagittal images. 3. 4 cm right adnexal cyst, not previously hypermetabolic. Cysts of this size in this age group typically warrant pelvic sonography for definitive characterization. 4. Other imaging findings of potential clinical significance: Pelvic floor laxity with small cystocele. Aortic Atherosclerosis (ICD10-I70.0). Calcified uterine fibroid. Electronically Signed   By: Van Clines M.D.   On: 07/31/2017 14:14   Ct Abdomen Pelvis W Contrast  Result Date: 07/31/2017 CLINICAL DATA:  Restaging of left breast cancer. Prior lumpectomy, chemotherapy, and radiation therapy. EXAM: CT CHEST, ABDOMEN, AND PELVIS WITH CONTRAST TECHNIQUE: Multidetector CT imaging of the chest, abdomen and pelvis was performed following the standard protocol during bolus administration of intravenous contrast. CONTRAST:  47m  ISOVUE-300 IOPAMIDOL (ISOVUE-300) INJECTION 61%, 1056mISOVUE-300 IOPAMIDOL (ISOVUE-300) INJECTION 61% COMPARISON:  Multiple exams, including PET-CT dated 05/13/2017 FINDINGS: CT CHEST FINDINGS Cardiovascular: Atherosclerotic calcification of the thoracic aorta and branch vessels. Mediastinum/Nodes: Small thyroid nodules are likely benign. The left parasternal soft tissue mass shown on the prior exam is significantly reduced in size, previously 4.9 by 3.6 cm by my measurements in currently 2.1 by 2.1 cm on image 22/2 and of significantly lower density by my measurements. The previously hypermetabolic axillary adenopathy has essentially resolved. Prior upper mediastinal adenopathy has likewise essentially resolved. Lungs/Pleura: Left apical pleuroparenchymal scarring. Dependent subsegmental atelectasis. Likely therapy related findings in the left breast. Musculoskeletal: Previously hypermetabolic nodule inferiorly along the right breast is no longer readily apparent, with only a faint bandlike density in this vicinity today. Degenerative glenohumeral arthropathy bilaterally. There is diffuse and increased sclerosis in the sternal manubrium compatible with prior tumor infiltration. Suspected pathologic fracture in the inferior portion of the sternal manubrium extending transversely. CT ABDOMEN PELVIS FINDINGS Hepatobiliary: Unremarkable Pancreas: Unremarkable Spleen: Unremarkable Adrenals/Urinary Tract: Pelvic floor laxity with small cystocele. The kidneys and adrenal glands appear unremarkable. Stomach/Bowel: Unremarkable Vascular/Lymphatic: Aortoiliac atherosclerotic vascular disease. The previously hypermetabolic portacaval lymph node measures 8 mm in diameter on image 62/2, formerly 10 mm. Reproductive: Calcified left uterine fibroid. Right adnexal cyst measures 4.1 by 3.9 cm, this was not previously hypermetabolic. Other: No supplemental non-categorized findings. Musculoskeletal: Pelvic floor laxity. Total right  hip prosthesis. Moderate degenerative arthropathy of the left hip. IMPRESSION: 1. Prior thoracic and abdominal adenopathy has essentially resolved. Notably reduced size of the left parasternal soft tissue mass extending into the mediastinum. The previously hypermetabolic right lower breast nodule has also essentially resolved. 2. Diffuse sclerosis throughout the sternal manubrium, likely representing response to therapy related to prior manubrial metastatic lesion. There is a suspected pathologic fracture along the inferior portion of the manubrium best seen on the parasagittal images. 3. 4 cm right adnexal cyst, not previously hypermetabolic. Cysts of this size in this age group typically warrant pelvic sonography for definitive characterization. 4. Other imaging findings of potential clinical significance: Pelvic floor laxity with small cystocele. Aortic Atherosclerosis (ICD10-I70.0). Calcified uterine fibroid. Electronically Signed   By: WaVan Clines.D.   On: 07/31/2017 14:14   Dg Chest Port 1 View  Result Date: 08/05/2017 CLINICAL DATA:  Shortness of breath,  former smoker. History of metastatic breast malignancy, has undergone radiation therapy and chemotherapy. EXAM: PORTABLE CHEST 1 VIEW COMPARISON:  Chest x-ray of June 22, 2017 and chest CT scan of July 31, 2017 FINDINGS: The lungs are adequately inflated and clear. The heart and pulmonary vascularity are normal. The mediastinum is normal in width. The power port catheter tip projects over the distal third of the SVC. The bony thorax exhibits no acute abnormality. IMPRESSION: There is no acute cardiopulmonary abnormality. Electronically Signed   By: David  Martinique M.D.   On: 08/05/2017 12:14   Dg Hip Unilat W Or Wo Pelvis 2-3 Views Right  Result Date: 08/05/2017 CLINICAL DATA:  [address] at home in the elevator.  Hip pain. EXAM: DG HIP (WITH OR WITHOUT PELVIS) 2-3V RIGHT COMPARISON:  09/04/2015 FINDINGS: Previous total hip replacement on the  right. Components appear well positioned. No sign of fracture or dislocation. No other right pelvic injury seen. Osteoarthritis of the left hip. IMPRESSION: No traumatic finding.  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 Electronically Signed   By: Nelson Chimes M.D.   On: 08/05/2017 10:17    Assessment/Plan Closed fracture of right ankle with routine healing, subsequent encounter   Continue PT/OT  Continue exercises as taught by PT/OT  Continue to elevate leg when at rest  Continue ice pack to the ankle when needed for pain, edema  Continue to monitor temperature and cap refills of the toes Q shift  Continue Tylenol 650 mg po Q 4 hours prn pain  Continue Tramadol 50 mg po BID scheduled for pain  Continue Hydrocodone/APAP 5/325 mg 1 tablet po Q 4 hours prn pain  Continue Celebrex 200 mg po BID for pain  Continue ASA 325 mg po BID for DVT prophylaxis  Follow up with Orthopedist as instructed for continuity of care  Family/ staff Communication:   Total Time:  Documentation:  Face to Face:  Family/Phone:   Labs/tests ordered:    Medication list reviewed and assessed for continued appropriateness. Monthly medication orders reviewed and signed.  Vikki Ports, NP-C Geriatrics [facility] [phone], [place] [address] Cell Phone (Mon-Fri 8am-5pm):  [phone] On Call:  [phone] & follow prompts after 5pm & weekends Office Phone:  [phone] Office Fax:  [phone]

## 2017-09-01 ENCOUNTER — Ambulatory Visit
Admission: RE | Admit: 2017-09-01 | Discharge: 2017-09-01 | Disposition: A | Discharge status: Home / Self Care | Payer: Medicare Other | Source: Ambulatory Visit | Attending: Internal Medicine | Admitting: Internal Medicine

## 2017-09-01 DIAGNOSIS — R2681 Unsteadiness on feet: Secondary | ICD-10-CM | POA: Insufficient documentation

## 2017-09-01 DIAGNOSIS — Z171 Estrogen receptor negative status [ER-]: Secondary | ICD-10-CM | POA: Diagnosis not present

## 2017-09-01 DIAGNOSIS — C50812 Malignant neoplasm of overlapping sites of left female breast: Secondary | ICD-10-CM | POA: Insufficient documentation

## 2017-09-01 MED ORDER — GADOBENATE DIMEGLUMINE 529 MG/ML IV SOLN
15.0000 mL | Freq: Once | INTRAVENOUS | Status: AC | PRN
  Administered 2017-09-01: 15 mL via INTRAVENOUS

## 2017-09-01 NOTE — Progress Notes (Signed)
Please inform patient/husband-that MRI is negative for any cancer.  Follow-up as planned.

## 2017-09-04 ENCOUNTER — Encounter: Payer: Self-pay | Admitting: Gerontology

## 2017-09-04 ENCOUNTER — Non-Acute Institutional Stay (SKILLED_NURSING_FACILITY): Payer: Medicare Other | Admitting: Gerontology

## 2017-09-04 DIAGNOSIS — S82891D Other fracture of right lower leg, subsequent encounter for closed fracture with routine healing: Secondary | ICD-10-CM

## 2017-09-04 NOTE — Progress Notes (Signed)
Location:   [facility] Room Number: [place] of Service:  SNF [phone]) Provider:  Toni Arthurs, NP-C  [name], MD  Patient Care Team: [name], MD as PCP - General (Internal Medicine) Bary Castilla, [name], MD (General Surgery) Requested, Self  Extended Emergency Contact Information Primary Emergency Contact: [place], [place] Address: [address]          [place], [place] [address] Johnnette Litter of [place] Phone: [phone] Mobile Phone: [phone] Relation: Spouse Secondary Emergency Contact: Evelena Leyden [place] of [place] Mobile Phone: [phone] Relation: Son  Code Status:  FULL Goals of care: Advanced Directive information Advanced Directives 09/04/2017  Does Patient Have a Medical Advance Directive? Yes  Type of Paramedic of [place];Living will  Does patient want to make changes to medical advance directive? No - Patient declined  Copy of [place] in Chart? Yes  Would patient like information on creating a medical advance directive? -     Chief Complaint  Patient presents with  . Medical Management of Chronic Issues    Routine Visit    HPI:  Pt is a 77 y.o. female seen today for medical management of chronic diseases. Pt has been participating in PT/OT for strengthening and mobility after hospitalization for Right ankle fracture with ORIF. Right foot/ankle is in a fiberglass cast. Pt's feet/ toes are warm, cap refills WNL and equal. Pt denies pain. Reports appetite is good, voiding well, having regular BMs. VSS. No other complaints.       Past Medical History:  Diagnosis Date  . Arthritis   . 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 Breast cancer ([place]) 2016   left breast; surgery 01/11/15 with Dr. Bary Castilla; path with invasive mammary and DCIS  . Breast cancer of upper-inner quadrant of left female breast ([place]) 12/15/14   Completed radiation end  of December and finished chemotherapy 2 weeks ago, Left breast invasive mammary carcinoma, [id number] (1.5 cm); Grade 3, [name], [name], [name], .  .Marland KitchenCataract    bilat   . DDD (degenerative disc disease), lumbar    Lumbar, previously evaluated by Dr. HEarnestine Leys . Dementia   . Fibrocystic breast disease    prior biopsy  . Glaucoma   . 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 Hearing loss   . History of cancer chemotherapy   . History of radiation therapy   . Hyperlipidemia, unspecified   . Imbalance   . Memory impairment    seen by Dr SManuella Ghazi possible post crainiotomy from radiation  . [place]    Left cavernous sinus meningioma, [facility].  . Numbness and tingling    right hand   . Osteoarthritis   . Osteoporosis, post-menopausal   . Shoulder pain, right   . Wears glasses    Past Surgical History:  Procedure Laterality Date  . APPENDECTOMY  1950  . BRAIN SURGERY  1995   left frontal/temporal  . BREAST BIOPSY Left 1997  . BREAST BIOPSY Left 12/15/14   confirmed DCIS  . BREAST BIOPSY Left 01/08/2015   Procedure: BREAST BIOPSY WITH NEEDLE LOCALIZATION;  Surgeon: [name] MD;  Location: [facility] ORS;  Service: General;  Laterality: Left;  . BREAST LUMPECTOMY Left 01/08/2015   Procedure: LUMPECTOMY;  Surgeon: [name] MD;  Location: [facility] ORS;  Service: General;  Laterality: Left;  . COLONOSCOPY  2010   Dr. [name]  . ORIF ANKLE FRACTURE Right 08/05/2017   Procedure: OPEN REDUCTION INTERNAL FIXATION (ORIF) ANKLE FRACTURE;  Surgeon: Earnestine Leys, MD;  Location: [facility] ORS;  Service: Orthopedics;  Laterality: Right;  . PORTACATH PLACEMENT Right 01/16/2015   Procedure: INSERTION PORT-A-CATH;  Surgeon: [name], MD;  Location: [facility] ORS;  Service: General;  Laterality: Right;  . SENTINEL NODE BIOPSY Left 01/16/2015   Procedure: SENTINEL NODE BIOPSY;  Surgeon:  [name], MD;  Location: [facility] ORS;  Service: General;  Laterality: Left;  . TOTAL HIP ARTHROPLASTY Right 09/04/2015   Procedure: RIGHT TOTAL HIP ARTHROPLASTY ANTERIOR APPROACH;  Surgeon: [name], MD;  Location: [facility];  Service: Orthopedics;  Laterality: Right;    Allergies  Allergen Reactions  . Donepezil     Other reaction(s): Other (See Comments) Nightmares    Allergies as of 09/04/2017      Reactions   Donepezil    Other reaction(s): Other (See Comments) Nightmares      Medication List        Accurate as of 09/04/17 11:00 AM. Always use your most recent med list.          acetaminophen 325 MG tablet Commonly known as:  TYLENOL Take 650 mg by mouth every 4 (four) hours as needed. Pain / increased temp.   aspirin 325 MG EC tablet Take 325 mg by mouth 2 (two) times daily.   bisacodyl 10 MG suppository Commonly known as:  DULCOLAX Place 1 suppository (10 mg total) rectally daily as needed for moderate constipation.   CALTRATE 600+D PLUS MINERALS 600-800 MG-UNIT Tabs Take 1 tablet by mouth every morning.   celecoxib 200 MG capsule Commonly known as:  CELEBREX Take 200 mg by mouth 2 (two) times daily.   HYDROcodone-acetaminophen 5-325 MG tablet Commonly known as:  NORCO/VICODIN Take 1 tablet by mouth every 4 (four) hours as needed for moderate pain ((score 4 to 6)).   loperamide 2 MG tablet Commonly known as:  IMODIUM A-D Give 2 tablets (4 mg) by mouth with first loose stool, then 1 tablet (2 mg) by mouth with each subsequent loose stool up to 8 doses in 24 hours.   loratadine 10 MG tablet Commonly known as:  CLARITIN Take 10 mg by mouth daily as needed.   methocarbamol 500 MG tablet Commonly known as:  ROBAXIN Take 1 tablet (500 mg total) by mouth every 6 (six) hours as needed for muscle spasms.   pantoprazole 40 MG tablet Commonly known as:  PROTONIX Take 40 mg by mouth daily.   prochlorperazine 10 MG tablet Commonly known as:  COMPAZINE Take  1 tablet (10 mg total) every 6 (six) hours as needed by mouth for nausea or vomiting.   senna 8.6 MG tablet Commonly known as:  SENOKOT Take 1 tablet by mouth 2 (two) times daily.   vitamin C 250 MG tablet Commonly known as:  ASCORBIC ACID Take 250 mg by mouth daily.   Vitamin D-3 5000 units Tabs Take 1 tablet by mouth daily.   vitamin E 400 UNIT capsule Take 400 Units by mouth daily.       Review of Systems  Constitutional: Negative for activity change, appetite change, chills, diaphoresis and fever.  HENT: Negative for congestion, mouth sores, nosebleeds, postnasal drip, sneezing, sore throat, trouble swallowing and voice change.   Respiratory: Negative for apnea, cough, choking, chest tightness, shortness of  breath and wheezing.   Cardiovascular: Negative for chest pain, palpitations and leg swelling.  Gastrointestinal: Negative for abdominal distention, abdominal pain, constipation, diarrhea and nausea.  Genitourinary: Negative for difficulty urinating, dysuria, frequency and urgency.  Musculoskeletal: Positive for arthralgias (typical arthritis) and gait problem. Negative for back pain and myalgias.  Skin: Negative for color change, pallor, rash and wound.  Neurological: Negative for dizziness, tremors, syncope, speech difficulty, weakness, numbness and headaches.  Psychiatric/Behavioral: Negative for agitation and behavioral problems.  All other systems reviewed and are negative.   Immunization History  Administered Date(s) Administered  . Influenza Split 03/28/2014  . Influenza,inj,Quad PF,6+ Mos 04/27/2016  . Influenza-Unspecified 05/01/2017  . Pneumococcal Conjugate-13 11/27/2015  . Pneumococcal Polysaccharide-23 06/02/2013   Pertinent  Health Maintenance Due  Topic Date Due  . COLONOSCOPY  05/18/1992  . DEXA SCAN  05/19/2007  . INFLUENZA VACCINE  Completed  . PNA vac Low Risk Adult  Completed   Fall Risk  06/17/2017 06/04/2017 05/21/2017 08/28/2016 02/22/2016    Falls in the past year? _0   Comment - - - - -   Functional Status Survey:    Vitals:   09/04/17 1028  BP: (!) 111/52  Pulse: 71  Resp: 18  Temp: 98.2 F (36.8 C)  TempSrc: Oral  SpO2: 96%  Weight: 162 lb 14.4 oz (73.9 kg)  Height: _1  (1.6 m)   Body mass index is 28.86 kg/m. Physical Exam  Constitutional: She is oriented to person, place, and time. Vital signs are normal. She appears well-developed and well-nourished. She is active and cooperative. She does not appear ill. No distress.  HENT:  Head: Normocephalic and atraumatic.  Mouth/Throat: Uvula is midline, oropharynx is clear and moist and mucous membranes are normal. Mucous membranes are not pale, not dry and not cyanotic.  Eyes: Conjunctivae, EOM and lids are normal. Pupils are equal, round, and reactive to light.  Neck: Trachea normal, normal range of motion and full passive range of motion without pain. Neck supple. No JVD present. No tracheal deviation, no edema and no erythema present. No thyromegaly present.  Cardiovascular: Normal rate, regular rhythm, normal heart sounds, intact distal pulses and normal pulses. Exam reveals no gallop, no distant heart sounds and no friction rub.  No murmur heard. Pulses:      Dorsalis pedis pulses are 2+ on the right side, and 2+ on the left side.  No edema  Pulmonary/Chest: Effort normal and breath sounds normal. No accessory muscle usage. No respiratory distress. She has no decreased breath sounds. She has no wheezes. She has no rhonchi. She has no rales. She exhibits no tenderness.  Abdominal: Soft. Normal appearance and bowel sounds are normal. She exhibits no distension and no ascites. There is no tenderness.  Musculoskeletal: She exhibits no edema or tenderness.       Right ankle: She exhibits decreased range of motion.  Expected osteoarthritis, stiffness; Bilateral Calves soft, supple. Negative Homan's Sign. B- pedal pulses equal; fiberglass cast in place;  mobile on unity in wheelchair  Neurological: She is alert and oriented to person, place, and time. She has normal strength. Coordination and gait abnormal.  Skin: Skin is warm, dry and intact. She is not diaphoretic. No cyanosis. No pallor. Nails show no clubbing.  Psychiatric: She has a normal mood and affect. Her speech is normal and behavior is normal. Judgment and thought content normal. Cognition and memory are normal.  Nursing note and vitals reviewed.   Labs reviewed: Recent  Labs    08/06/17 0441 08/07/17 0449 08/20/17 0946  NA 133* 137 137  K 4.2 3.8 4.0  CL 101 104 101  CO2 _0 GLUCOSE 154* 96 132*  BUN _1 CREATININE 0.69 0.75 0.82  CALCIUM 8.4* 8.2* 8.7*   Recent Labs    08/06/17 0441 08/07/17 0449 08/20/17 0946  AST _2 ALT _3 ALKPHOS 50 45 58  BILITOT 0.5 0.5 0.4  PROT 6.2* 5.6* 6.5  ALBUMIN 3.5 3.4* 3.7   Recent Labs    07/22/17 1124 08/05/17 0923 08/20/17 0946  WBC 3.8 6.3 6.9  NEUTROABS 2.7 4.6 5.5  HGB 12.2 12.5 11.4*  HCT 36.9 37.4 34.4*  MCV 92.8 92.8 91.3  PLT 335 360 367   No results found for: TSH No results found for: HGBA1C No results found for: CHOL, HDL, LDLCALC, LDLDIRECT, TRIG, CHOLHDL  Significant Diagnostic Results in last 30 days:  Mr Jeri Cos Wo Contrast  Result Date: 09/01/2017 CLINICAL DATA:  Altered level of consciousness. Gait instability. History of carcinoma breast. History of meningioma resection 1995 EXAM: MRI HEAD WITHOUT AND WITH CONTRAST TECHNIQUE: Multiplanar, multiecho pulse sequences of the brain and surrounding structures were obtained without and with intravenous contrast. CONTRAST:  25m MULTIHANCE GADOBENATE DIMEGLUMINE 529 MG/ML IV SOLN COMPARISON:  MRI 05/18/2017 FINDINGS: Brain: Postop left frontal craniotomy, presumably for meningioma resection. Mild encephalomalacia in the left frontal lobe. No recurrent tumor in this area. No enhancing metastatic deposits in the brain. Moderate white  matter hyperintensity bilaterally unchanged from the prior study. Mild hyperintensity in the pons unchanged. No acute infarct. Chronic microhemorrhage in the pons and left superior cerebellum unchanged from the prior MRI. Vascular: Normal arterial flow voids Skull and upper cervical spine: Negative Sinuses/Orbits: Negative Other: None IMPRESSION: Negative for metastatic disease to the brain. No change from the prior MRI. Electronically Signed   By: CFranchot GalloM.D.   On: 09/01/2017 12:04   Dg Chest Port 1 View  Result Date: 08/05/2017 CLINICAL DATA:  Shortness of breath, former smoker. History of metastatic breast malignancy, has undergone radiation therapy and chemotherapy. EXAM: PORTABLE CHEST 1 VIEW COMPARISON:  Chest x-ray of June 22, 2017 and chest CT scan of July 31, 2017 FINDINGS: The lungs are adequately inflated and clear. The heart and pulmonary vascularity are normal. The mediastinum is normal in width. The power port catheter tip projects over the distal third of the SVC. The bony thorax exhibits no acute abnormality. IMPRESSION: There is no acute cardiopulmonary abnormality. Electronically Signed   By: David  JMartiniqueM.D.   On: 08/05/2017 12:14    Assessment/Plan Closed fracture of right ankle with routine healing, subsequent encounter  Continue PT/OT  Continue exercises as taught by PT/OT  Continue to elevate leg when at rest  Continue ice prn to the ankle for pain/ edema  Continue Tylenol 650 mg po Q 4 hours prn pain  Continue Norco 5/325 mg po Q 4 hours prn pain  Continue Celebrex 200 mg po BID for pain  Continue ASA 325 mg po BID for DVT prophylaxis  Follow up with Orthopedist as instructed for continuity of care  Family/ staff Communication:   Total Time:  Documentation:  Face to Face:  Family/Phone:   Labs/tests ordered:  Cbc, met c prior to assessment next week  Medication list reviewed and assessed for continued appropriateness. Monthly medication  orders reviewed and signed.  SVikki Ports NP-C [facility]  [facility] [address], [place] [address] Cell Phone (Mon-Fri 8am-5pm):  [phone] On Call:  [phone] & follow prompts after 5pm & weekends Office Phone:  [phone] Office Fax:  [phone]

## 2017-09-10 ENCOUNTER — Inpatient Hospital Stay: Payer: Medicare Other | Attending: Internal Medicine

## 2017-09-10 ENCOUNTER — Inpatient Hospital Stay (HOSPITAL_BASED_OUTPATIENT_CLINIC_OR_DEPARTMENT_OTHER): Payer: Medicare Other | Admitting: Internal Medicine

## 2017-09-10 ENCOUNTER — Inpatient Hospital Stay: Payer: Medicare Other

## 2017-09-10 VITALS — BP 113/59 | HR 65 | Temp 96.7°F | Resp 16

## 2017-09-10 VITALS — BP 118/61 | HR 68 | Temp 96.6°F | Resp 18

## 2017-09-10 DIAGNOSIS — Z5112 Encounter for antineoplastic immunotherapy: Secondary | ICD-10-CM | POA: Insufficient documentation

## 2017-09-10 DIAGNOSIS — F0390 Unspecified dementia without behavioral disturbance: Secondary | ICD-10-CM | POA: Diagnosis not present

## 2017-09-10 DIAGNOSIS — C50812 Malignant neoplasm of overlapping sites of left female breast: Secondary | ICD-10-CM

## 2017-09-10 DIAGNOSIS — Z171 Estrogen receptor negative status [ER-]: Secondary | ICD-10-CM | POA: Insufficient documentation

## 2017-09-10 DIAGNOSIS — Z5181 Encounter for therapeutic drug level monitoring: Secondary | ICD-10-CM | POA: Insufficient documentation

## 2017-09-10 DIAGNOSIS — Z79899 Other long term (current) drug therapy: Secondary | ICD-10-CM

## 2017-09-10 LAB — CBC WITH DIFFERENTIAL/PLATELET
BASOS PCT: 1 %
Basophils Absolute: 0 10*3/uL (ref 0–0.1)
EOS ABS: 0.2 10*3/uL (ref 0–0.7)
Eosinophils Relative: 4 %
HCT: 37 % (ref 35.0–47.0)
Hemoglobin: 12.1 g/dL (ref 12.0–16.0)
Lymphocytes Relative: 19 %
Lymphs Abs: 0.9 10*3/uL — ABNORMAL LOW (ref 1.0–3.6)
MCH: 29.7 pg (ref 26.0–34.0)
MCHC: 32.8 g/dL (ref 32.0–36.0)
MCV: 90.6 fL (ref 80.0–100.0)
MONO ABS: 0.4 10*3/uL (ref 0.2–0.9)
MONOS PCT: 9 %
Neutro Abs: 3.2 10*3/uL (ref 1.4–6.5)
Neutrophils Relative %: 67 %
PLATELETS: 291 10*3/uL (ref 150–440)
RBC: 4.08 MIL/uL (ref 3.80–5.20)
RDW: 15.4 % — AB (ref 11.5–14.5)
WBC: 4.8 10*3/uL (ref 3.6–11.0)

## 2017-09-10 LAB — COMPREHENSIVE METABOLIC PANEL
ALBUMIN: 3.9 g/dL (ref 3.5–5.0)
ALK PHOS: 64 U/L (ref 38–126)
ALT: 17 U/L (ref 14–54)
AST: 22 U/L (ref 15–41)
Anion gap: 8 (ref 5–15)
BILIRUBIN TOTAL: 0.7 mg/dL (ref 0.3–1.2)
BUN: 15 mg/dL (ref 6–20)
CALCIUM: 9 mg/dL (ref 8.9–10.3)
CO2: 27 mmol/L (ref 22–32)
CREATININE: 0.76 mg/dL (ref 0.44–1.00)
Chloride: 101 mmol/L (ref 101–111)
GFR calc non Af Amer: >60 mL/min (ref >60)
GLUCOSE: 108 mg/dL — AB (ref 65–99)
Potassium: 4.1 mmol/L (ref 3.5–5.1)
SODIUM: 136 mmol/L (ref 135–145)
TOTAL PROTEIN: 6.7 g/dL (ref 6.5–8.1)

## 2017-09-10 MED ORDER — HEPARIN SOD (PORK) LOCK FLUSH 100 UNIT/ML IV SOLN: 500.0000 [IU] | Freq: Once | INTRAVENOUS | Status: DC | PRN

## 2017-09-10 MED ORDER — TRASTUZUMAB CHEMO 150 MG IV SOLR
450.0000 mg | Freq: Once | INTRAVENOUS | Status: AC
  Administered 2017-09-10: 450 mg via INTRAVENOUS
  Filled 2017-09-10: qty 21.43

## 2017-09-10 MED ORDER — SODIUM CHLORIDE 0.9 % IV SOLN
Freq: Once | INTRAVENOUS | Status: AC
  Administered 2017-09-10: 11:00:00 via INTRAVENOUS
  Filled 2017-09-10: qty 1000

## 2017-09-10 MED ORDER — DIPHENHYDRAMINE HCL 50 MG/ML IJ SOLN
12.5000 mg | Freq: Once | INTRAMUSCULAR | Status: AC
  Administered 2017-09-10: 12.5 mg via INTRAVENOUS
  Filled 2017-09-10: qty 1

## 2017-09-10 MED ORDER — SODIUM CHLORIDE 0.9% FLUSH
10.0000 mL | Freq: Once | INTRAVENOUS | Status: AC
  Administered 2017-09-10: 10 mL via INTRAVENOUS
  Filled 2017-09-10: qty 10

## 2017-09-10 MED ORDER — SODIUM CHLORIDE 0.9 % IV SOLN
420.0000 mg | Freq: Once | INTRAVENOUS | Status: AC
  Administered 2017-09-10: 420 mg via INTRAVENOUS
  Filled 2017-09-10: qty 14

## 2017-09-10 MED ORDER — HEPARIN SOD (PORK) LOCK FLUSH 100 UNIT/ML IV SOLN
500.0000 [IU] | Freq: Once | INTRAVENOUS | Status: AC
  Administered 2017-09-10: 500 [IU] via INTRAVENOUS
  Filled 2017-09-10: qty 5

## 2017-09-10 MED ORDER — ACETAMINOPHEN 325 MG PO TABS
650.0000 mg | ORAL_TABLET | Freq: Once | ORAL | Status: AC
  Administered 2017-09-10: 650 mg via ORAL
  Filled 2017-09-10: qty 2

## 2017-09-10 NOTE — Assessment & Plan Note (Addendum)
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  CT scan Jan 2019 shows partial response.  # Proceed with cycle #5 H-P only.  CBC CMP are reviewed; adequate today; no contraindications to chemotherapy. Proceed with treatment today.   # ? Syncope/falls- MRI-NEG; awaiting eval with Dr.Shah re: possible seizures;  # Dementia-/chronic debility/ataxia- STABLE.   #Follow-up in 3 weeks labs Herceptin perjeta; MUGA scan prior.

## 2017-09-10 NOTE — Progress Notes (Signed)
[facility] OFFICE PROGRESS NOTE  Patient Care Team: [name], [name], MD (General Surgery) Requested, Self  Breast cancer metastasized to axillary lymph node [facility])   Staging form: Breast, [name], [name], [name], MD on 01/29/2015    Oncology History   # June 2016- LEFT BREAST CA;  [name] ; [name] ; [name]; Her 2 Neu POS; [place] from July OF 2016; [name]; adjuvant Herceptin [ Finished July 2017]; AUG 2017- Neratinib x5 days; DISCON sec to diarrhea  # MID OCT 2018- Right breast mass-Bx- ER/PR-NEG; Her 2 NEU POSITIVE 1~2.5cm;  [?NEW primary]  # MID-OCT 2018-METASTATIC RECURRENT-oh sternal mass; Left Ax LN [Bx]/periportal LN  # OCT 25th 2018- TAXOL-HERCEPTIN-PERJETA; Jan 2019- CT PR; continue HP only.    -------------------------------------------------------------------------------   # MUGA scan- July 28th 2017- 67%.  # chronic gait/balance issues  # June 2017- left breast Bx- fat necrosis [[name]  # july 2017-  BRCA 1& 2- NEG.   MOLECULAR TESTING- F ONE- TPS- 0%; pending; ERB2 amplification; PI3K/RET amplification Others**     Carcinoma of overlapping sites of left breast in female, estrogen receptor negative ([place])    INTERVAL HISTORY: Patient a poor historian given mild dementia.   [name] 77 y.o.  female pleasant patient above history of  recurrent/metastatic breast cancer ER/[name] HER-2/neu positive currently on Herceptin-perjeta maintenance is here for follow-up.  Patient is currently in the rehab recuperating from her recent right ankle surgery.  Incisions are healing well.  Patient denies any headaches.  Denies any nausea vomiting.  Did not have any repeated episodes.  REVIEW OF SYSTEMS:  A complete 10 point review  of system is done which is negative except mentioned above/history of present illness.   PAST MEDICAL HISTORY :  Past Medical History:  Diagnosis Date  . Arthritis   . 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 Breast cancer ([place]) 2016   left breast; surgery 01/11/15 with Dr. Bary Castilla; path with invasive mammary and DCIS  . Breast cancer of upper-inner quadrant of left female breast ([place]) 12/15/14   Completed radiation end of December and finished chemotherapy 2 weeks ago, Left breast invasive mammary carcinoma, T1cN54mc (1.5 cm); Grade 3, [name], [name], [name], .  .Marland KitchenCataract    bilat   . DDD (degenerative disc disease), lumbar    Lumbar, previously evaluated by Dr. HEarnestine Leys . Dementia   . Fibrocystic breast disease    prior biopsy  . Glaucoma   . 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 Hearing loss   . History of cancer chemotherapy   . History of radiation therapy   . Hyperlipidemia, unspecified   . Imbalance   . Memory impairment    seen by Dr SManuella Ghazi possible post crainiotomy from radiation  . 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  . Numbness and tingling    right hand   . Osteoarthritis   . Osteoporosis, post-menopausal   . Shoulder pain, right   . Wears glasses     PAST SURGICAL HISTORY :   Past Surgical History:  Procedure Laterality Date  . APPENDECTOMY  1950  . BRAIN SURGERY  1995   left frontal/temporal  . BREAST BIOPSY Left 1997  . BREAST BIOPSY Left 12/15/14   confirmed DCIS  . BREAST BIOPSY Left 01/08/2015   Procedure: BREAST BIOPSY WITH NEEDLE LOCALIZATION;  Surgeon: [name], MD;  Location: [facility] ORS;  Service: General;  Laterality: Left;  . BREAST LUMPECTOMY Left 01/08/2015   Procedure: LUMPECTOMY;  Surgeon: [name], MD;  Location: [facility] ORS;  Service: General;  Laterality: Left;  . COLONOSCOPY  2010   Dr. [name]   . ORIF ANKLE FRACTURE Right 08/05/2017   Procedure: OPEN REDUCTION INTERNAL FIXATION (ORIF) ANKLE FRACTURE;  Surgeon: Earnestine Leys, MD;  Location: [facility] ORS;  Service: Orthopedics;  Laterality: Right;  . PORTACATH PLACEMENT Right 01/16/2015   Procedure: INSERTION PORT-A-CATH;  Surgeon: [name], MD;  Location: [facility] ORS;  Service: General;  Laterality: Right;  . SENTINEL NODE BIOPSY Left 01/16/2015   Procedure: SENTINEL NODE BIOPSY;  Surgeon: [name], MD;  Location: [facility] OR[facility];  Service: General;  Laterality: Left;  . TOTAL HIP ARTHROPLASTY Right 09/04/2015   Procedure: RIGHT TOTAL HIP ARTHROPLASTY ANTERIOR APPROACH;  Surgeon: [name], MD;  Location: [facility];  Service: Orthopedics;  Laterality: Right;    FAMILY HISTORY :   Family History  Problem Relation Age of Onset  . Breast cancer Sister 35  . Addison's disease Mother   . Hyperthyroidism Mother   . Osteoarthritis Mother   . Stroke Father   . Heart disease Father   . High blood pressure Father     SOCIAL HISTORY:   Social History   Tobacco Use  . Smoking status: Former Smoker    Packs/day: 0.25    Years: 5.00    Pack years: 1.25    Types: Cigarettes    Last attempt to quit: 07/28/1972    Years since quitting: 45.1  . Smokeless tobacco: Never Used  Substance Use Topics  . Alcohol use: Yes    Alcohol/week: 0.6 - 1.2 oz    Types: 1 - 2 Glasses of wine per week    Comment: 1 Glass Wine / Night  . Drug use: No    ALLERGIES:  is allergic to donepezil.  MEDICATIONS:  Current Outpatient Medications  Medication Sig Dispense Refill  . acetaminophen (TYLENOL) 325 MG tablet Take 650 mg by mouth every 4 (four) hours as needed. Pain / increased temp.    Marland Kitchen aspirin 325 MG EC tablet Take 325 mg by mouth 2 (two) times daily.    . bisacodyl (DULCOLAX) 10 MG suppository Place 1 suppository (10 mg total) rectally daily as needed for moderate constipation. 12 suppository 0  . Calcium Carbonate-Vit D-Min (CALTRATE 600+D  PLUS MINERALS) 600-800 MG-UNIT TABS Take 1 tablet by mouth every morning.     . celecoxib (CELEBREX) 200 MG capsule Take 200 mg by mouth 2 (two) times daily.    . Cholecalciferol (VITAMIN D-3) 5000 units TABS Take 1 tablet by mouth daily.    Marland Kitchen HYDROcodone-acetaminophen (NORCO/VICODIN) 5-325 MG tablet Take 1 tablet by mouth every 4 (four) hours as needed for moderate pain ((score 4 to 6)). 30 tablet 0  . loperamide (IMODIUM A-D) 2 MG tablet Give 2 tablets (4 mg) by mouth with first loose stool, then 1 tablet (2 mg) by mouth with each subsequent loose stool up to 8 doses in 24 hours.    Marland Kitchen loratadine (CLARITIN) 10 MG tablet Take 10 mg by  mouth daily as needed.     . methocarbamol (ROBAXIN) 500 MG tablet Take 1 tablet (500 mg total) by mouth every 6 (six) hours as needed for muscle spasms. 30 tablet 0  . pantoprazole (PROTONIX) 40 MG tablet Take 40 mg by mouth daily.    . prochlorperazine (COMPAZINE) 10 MG tablet Take 1 tablet (10 mg total) every 6 (six) hours as needed by mouth for nausea or vomiting. 40 tablet 1  . senna (SENOKOT) 8.6 MG tablet Take 1 tablet by mouth 2 (two) times daily.    . vitamin C (ASCORBIC ACID) 250 MG tablet Take 250 mg by mouth daily.    . vitamin E 400 UNIT capsule Take 400 Units by mouth daily.      No current facility-administered medications for this visit.     PHYSICAL EXAMINATION: ECOG PERFORMANCE STATUS: 1 - Symptomatic but completely ambulatory  BP (!) 113/59 (BP Location: Left Arm)   Pulse 65   Temp (!) 96.7 F (35.9 C) (Tympanic)   Resp 16   Filed Weights    GENERAL: Well-nourished well-developed; Alert, no distress and comfortable.  With her husband.  In a wheelchair. EYES: no pallor or icterus OROPHARYNX: no thrush or ulceration; good dentition  NECK: supple, no masses felt LYMPH:  palpable lymphadenopathy Left supraclavicular region ; left cervical region. Adenopathy felt in the left axillary/improved. LUNGS: clear to auscultation and  No wheeze or  crackles HEART/CVS: regular rate & rhythm and no murmurs; No lower extremity edema; right lower extremity Ace wrap. ABDOMEN:abdomen soft, non-tender and normal bowel sounds Musculoskeletal:no cyanosis of digits and no clubbing;  PSYCH: alert & oriented x 3 with fluent speech NEURO: no focal motor/sensory deficits SKIN:  no rashes or significant lesions.   LABORATORY DATA:  I have reviewed the data as listed    Component Value Date/Time   NA 136 09/10/2017 0904   K 4.1 09/10/2017 0904   CL 101 09/10/2017 0904   CO2 27 09/10/2017 0904   GLUCOSE 108 (H) 09/10/2017 0904   BUN 15 09/10/2017 0904   CREATININE 0.76 09/10/2017 0904   CALCIUM 9.0 09/10/2017 0904   PROT 6.7 09/10/2017 0904   ALBUMIN 3.9 09/10/2017 0904   AST 22 09/10/2017 0904   ALT 17 09/10/2017 0904   ALKPHOS 64 09/10/2017 0904   BILITOT 0.7 09/10/2017 0904   GFRNONAA >60 09/10/2017 0904   GFRAA >60 09/10/2017 0904    No results found for: SPEP, UPEP  Lab Results  Component Value Date   WBC 4.8 09/10/2017   NEUTROABS 3.2 09/10/2017   HGB 12.1 09/10/2017   HCT 37.0 09/10/2017   MCV 90.6 09/10/2017   PLT 291 09/10/2017      Chemistry      Component Value Date/Time   NA 136 09/10/2017 0904   K 4.1 09/10/2017 0904   CL 101 09/10/2017 0904   CO2 27 09/10/2017 0904   BUN 15 09/10/2017 0904   CREATININE 0.76 09/10/2017 0904      Component Value Date/Time   CALCIUM 9.0 09/10/2017 0904   ALKPHOS 64 09/10/2017 0904   AST 22 09/10/2017 0904   ALT 17 09/10/2017 0904   BILITOT 0.7 09/10/2017 0904     No  focal wall motion abnormality of the left ventricle. Calculated left ventricular ejection fraction equals 67%. Previously 61% on 11/13/2015 IMPRESSION: Left ventricular ejection fraction equals 67 %. Electronically Signed   By: Suzy Bouchard M.D.   On: 02/20/2016 15:02 -----------------------------------------------  IMPRESSION: 1. Prior thoracic and abdominal adenopathy has essentially  resolved. Notably reduced size of the left parasternal soft tissue mass extending into the mediastinum. The previously hypermetabolic right lower breast nodule has also essentially resolved. 2. Diffuse sclerosis throughout the sternal manubrium, likely representing response to therapy related to prior manubrial metastatic lesion. There is a suspected pathologic fracture along the inferior portion of the manubrium best seen on the parasagittal images. 3. 4 cm right adnexal cyst, not previously hypermetabolic. Cysts of this size in this age group typically warrant pelvic sonography for definitive characterization. 4. Other imaging findings of potential clinical significance: Pelvic floor laxity with small cystocele. Aortic Atherosclerosis (ICD10-I70.0). Calcified uterine fibroid.  RADIOGRAPHIC STUDIES: I have personally reviewed the radiological images as listed and agreed with the findings in the report. No results found.   ASSESSMENT & PLAN:  Carcinoma of overlapping sites of left breast in female, estrogen receptor negative ([place]) # Metastatic ER/[name] HER-2/neu positive- currently on Taxol Herceptin perjeta; she is currently status post cycle #3.  CT scan Jan 2019 shows partial response.  # Proceed with cycle #5 H-P only.  CBC CMP are reviewed; adequate today; no contraindications to chemotherapy. Proceed with treatment today.   # ? Syncope/falls- MRI-NEG; awaiting eval with Dr.Shah re: possible seizures;  # Dementia-/chronic debility/ataxia- STABLE.   #Follow-up in 3 weeks labs Herceptin perjeta; MUGA scan prior.  Orders Placed This Encounter  Procedures  . NM Cardiac [place] [facility]    Standing Status:   Future    Standing Expiration Date:   09/10/2018    Order Specific Question:   Will [facility] be the location of this test?    Answer:   Yes    Order Specific Question:   caspofungin (CANCIDAS) frequency    Answer:   Q 24H    Order Specific Question:   Preferred  imaging location?    Answer:   [facility]    Order Specific Question:   Radiology Contrast Protocol - do NOT remove file path    Answer:   [email or web address]  . CBC with Differential/Platelet    Standing Status:   Future    Standing Expiration Date:   09/10/2018  . Comprehensive metabolic panel    Standing Status:   Future    Standing Expiration Date:   09/10/2018   All questions were answered. The patient knows to call the clinic with any problems, questions or concerns.      Cammie Sickle, MD 09/10/2017 7:36 PM

## 2017-09-10 NOTE — Progress Notes (Signed)
Per Dr. Rogue Bussing okay to proceed with 05/19/17 Echo.

## 2017-09-11 ENCOUNTER — Non-Acute Institutional Stay (SKILLED_NURSING_FACILITY): Payer: Medicare Other | Admitting: Gerontology

## 2017-09-11 ENCOUNTER — Ambulatory Visit: Payer: Medicare Other | Admitting: Radiation Oncology

## 2017-09-11 ENCOUNTER — Encounter: Payer: Self-pay | Admitting: Gerontology

## 2017-09-11 DIAGNOSIS — S82891D Other fracture of right lower leg, subsequent encounter for closed fracture with routine healing: Secondary | ICD-10-CM | POA: Diagnosis not present

## 2017-09-11 NOTE — Progress Notes (Signed)
Location:   [facility] Room Number: [place] of Service:  SNF [phone])  Provider: Toni Arthurs, NP-C  PCP: [name], MD Patient Care Team: [name], MD as PCP - General (Internal Medicine) Bary Castilla, [name], MD (General Surgery) Requested, Self  Extended Emergency Contact Information Primary Emergency Contact: Lysle Morales Address: [address]          [place], [place] [address] Johnnette Litter of [place] Phone: [phone] Mobile Phone: [phone] Relation: Spouse Secondary Emergency Contact: Evelena Leyden [place] of [place] Mobile Phone: [phone] Relation: Son  Code Status: FULL Goals of care:  Advanced Directive information Advanced Directives 09/11/2017  Does Patient Have a Medical Advance Directive? Yes  Type of Paramedic of [place];Living will  Does patient want to make changes to medical advance directive? No - Patient declined  Copy of [place] in Chart? Yes  Would patient like information on creating a medical advance directive? -     Allergies  Allergen Reactions  . Donepezil     Other reaction(s): Other (See Comments) Nightmares    Chief Complaint  Patient presents with  . Discharge Note    Discharged from SNF    HPI:  77 y.o. female seen today for discharge evaluation. Pt was admitted to the facility for rehab following hospitalization for Right ankle fracture and subsequent ORIF. Pt has had a fiberglass cast in place. However, at recent Ortho appointment, fiberglass cast was dc'ed and she now has a CAM-walking boot and acewrap. Pt has been participating in PT/OT. Pt reports her pain is well controlled on current regimen. She is ambulatory with rolling walker and wheelchair. Pt reports her appetite is good. She is voiding well and having regular BMs. B- pedal pulses equal and strong. Calves soft, supple. Negative Homan's sign. Pt  reports she is feeling well and ready to go home. VSS. No other complaints.      Past Medical History:  Diagnosis Date  . Arthritis   . 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 Breast cancer ([place]) 2016   left breast; surgery 01/11/15 with Dr. Bary Castilla; path with invasive mammary and DCIS  . Breast cancer of upper-inner quadrant of left female breast ([place]) 12/15/14   Completed radiation end of December and finished chemotherapy 2 weeks ago, Left breast invasive mammary carcinoma, T1cN29mc (1.5 cm); Grade 3, [name], [name], [name], .  .Marland KitchenCataract    bilat   . DDD (degenerative disc disease), lumbar    Lumbar, previously evaluated by Dr. HEarnestine Leys . Dementia   . Fibrocystic breast disease    prior biopsy  . Glaucoma   . 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 Hearing loss   . History of cancer chemotherapy   . History of radiation therapy   . Hyperlipidemia, unspecified   . Imbalance   . Memory impairment    seen by Dr SManuella Ghazi possible post crainiotomy from radiation  . Meningioma ([place]    Left cavernous sinus meningioma, [facility].  . Numbness and tingling    right hand   . Osteoarthritis   . Osteoporosis, post-menopausal   . Shoulder pain, right   . Wears glasses     Past Surgical History:  Procedure Laterality Date  . APPENDECTOMY  1950  . BRAIN  SURGERY  1995   left frontal/temporal  . BREAST BIOPSY Left 1997  . BREAST BIOPSY Left 12/15/14   confirmed DCIS  . BREAST BIOPSY Left 01/08/2015   Procedure: BREAST BIOPSY WITH NEEDLE LOCALIZATION;  Surgeon: [name], MD;  Location: [facility] ORS;  Service: General;  Laterality: Left;  . BREAST LUMPECTOMY Left 01/08/2015   Procedure: LUMPECTOMY;  Surgeon: [name], MD;  Location: [facility] OR[facility];  Service: General;  Laterality: Left;  . COLONOSCOPY  2010   Dr. [name]  . ORIF ANKLE FRACTURE Right 08/05/2017   Procedure: OPEN  REDUCTION INTERNAL FIXATION (ORIF) ANKLE FRACTURE;  Surgeon: Earnestine Leys, MD;  Location: [facility] OR[facility];  Service: Orthopedics;  Laterality: Right;  . PORTACATH PLACEMENT Right 01/16/2015   Procedure: INSERTION PORT-A-CATH;  Surgeon: [name], MD;  Location: [facility] OR[facility];  Service: General;  Laterality: Right;  . SENTINEL NODE BIOPSY Left 01/16/2015   Procedure: SENTINEL NODE BIOPSY;  Surgeon: [name], MD;  Location: [facility] OR[facility];  Service: General;  Laterality: Left;  . TOTAL HIP ARTHROPLASTY Right 09/04/2015   Procedure: RIGHT TOTAL HIP ARTHROPLASTY ANTERIOR APPROACH;  Surgeon: [name], MD;  Location: [facility];  Service: Orthopedics;  Laterality: Right;      reports that she quit smoking about 45 years ago. Her smoking use included cigarettes. She has a 1.25 pack-year smoking history. she has never used smokeless tobacco. She reports that she drinks about 0.6 - 1.2 oz of alcohol per week. She reports that she does not use drugs. Social History   Socioeconomic History  . Marital status: Married    Spouse name: Legrand Como  . Number of children: 4  . Years of education: Not on file  . Highest education level: Not on file  Social Needs  . Financial resource strain: Not on file  . Food insecurity - worry: Not on file  . Food insecurity - inability: Not on file  . Transportation needs - medical: Not on file  . Transportation needs - non-medical: Not on file  Occupational History  . Not on file  Tobacco Use  . Smoking status: Former Smoker    Packs/day: 0.25    Years: 5.00    Pack years: 1.25    Types: Cigarettes    Last attempt to quit: 07/28/1972    Years since quitting: 45.1  . Smokeless tobacco: Never Used  Substance and Sexual Activity  . Alcohol use: Yes    Alcohol/week: 0.6 - 1.2 oz    Types: 1 - 2 Glasses of wine per week    Comment: 1 Glass Wine / Night  . Drug use: No  . Sexual activity: Not on file  Other Topics Concern  . Not on file  Social History Narrative  .  Not on file   Functional Status Survey:    Allergies  Allergen Reactions  . Donepezil     Other reaction(s): Other (See Comments) Nightmares    Pertinent  Health Maintenance Due  Topic Date Due  . COLONOSCOPY  05/18/1992  . DEXA SCAN  05/19/2007  . INFLUENZA VACCINE  Completed  . PNA vac Low Risk Adult  Completed    Medications: Allergies as of 09/11/2017      Reactions   Donepezil    Other reaction(s): Other (See Comments) Nightmares      Medication List        Accurate as of 09/11/17 10:35 AM. Always use your most recent med list.  acetaminophen 325 MG tablet Commonly known as:  TYLENOL Take 650 mg by mouth every 4 (four) hours as needed. Pain / increased temp.   aspirin 325 MG EC tablet Take 325 mg by mouth 2 (two) times daily.   bisacodyl 10 MG suppository Commonly known as:  DULCOLAX Place 1 suppository (10 mg total) rectally daily as needed for moderate constipation.   CALTRATE 600+D PLUS MINERALS 600-800 MG-UNIT Tabs Take 1 tablet by mouth every morning.   celecoxib 200 MG capsule Commonly known as:  CELEBREX Take 200 mg by mouth 2 (two) times daily.   HYDROcodone-acetaminophen 5-325 MG tablet Commonly known as:  NORCO/VICODIN Take 1 tablet by mouth every 4 (four) hours as needed for moderate pain ((score 4 to 6)).   loratadine 10 MG tablet Commonly known as:  CLARITIN Take 10 mg by mouth daily as needed for allergies.   methocarbamol 500 MG tablet Commonly known as:  ROBAXIN Take 1 tablet (500 mg total) by mouth every 6 (six) hours as needed for muscle spasms.   prochlorperazine 10 MG tablet Commonly known as:  COMPAZINE Take 1 tablet (10 mg total) every 6 (six) hours as needed by mouth for nausea or vomiting.   senna 8.6 MG tablet Commonly known as:  SENOKOT Take 1 tablet by mouth 2 (two) times daily.   vitamin C 250 MG tablet Commonly known as:  ASCORBIC ACID Take 250 mg by mouth daily.   Vitamin D-3 5000 units Tabs Take 1  tablet by mouth daily.   vitamin E 400 UNIT capsule Take 400 Units by mouth daily.       Review of Systems  Constitutional: Negative for activity change, appetite change, chills, diaphoresis and fever.  HENT: Negative for congestion, mouth sores, nosebleeds, postnasal drip, sneezing, sore throat, trouble swallowing and voice change.   Respiratory: Negative for apnea, cough, choking, chest tightness, shortness of breath and wheezing.   Cardiovascular: Negative for chest pain, palpitations and leg swelling.  Gastrointestinal: Negative for abdominal distention, abdominal pain, constipation, diarrhea and nausea.  Genitourinary: Negative for difficulty urinating, dysuria, frequency and urgency.  Musculoskeletal: Positive for arthralgias (typical arthritis) and gait problem. Negative for back pain and myalgias.  Skin: Negative for color change, pallor, rash and wound.  Neurological: Negative for dizziness, tremors, syncope, speech difficulty, weakness, numbness and headaches.  Psychiatric/Behavioral: Negative for agitation and behavioral problems.  All other systems reviewed and are negative.   Vitals:   09/11/17 1029  BP: 106/60  Pulse: 76  Resp: 16  Temp: (!) 97.5 F (36.4 C)  TempSrc: Oral  SpO2: 96%  Weight: 161 lb 1.6 oz (73.1 kg)  Height: _0  (1.6 m)   Body mass index is 28.54 kg/m. Physical Exam  Constitutional: She is oriented to person, place, and time. Vital signs are normal. She appears well-developed and well-nourished. She is active and cooperative. She does not appear ill. No distress.  HENT:  Head: Normocephalic and atraumatic.  Mouth/Throat: Uvula is midline, oropharynx is clear and moist and mucous membranes are normal. Mucous membranes are not pale, not dry and not cyanotic.  Eyes: Conjunctivae, EOM and lids are normal. Pupils are equal, round, and reactive to light.  Neck: Trachea normal, normal range of motion and full passive range of motion without pain.  Neck supple. No JVD present. No tracheal deviation, no edema and no erythema present. No thyromegaly present.  Cardiovascular: Normal rate, regular rhythm, normal heart sounds, intact distal pulses and normal pulses. Exam reveals no gallop,  no distant heart sounds and no friction rub.  No murmur heard. Pulses:      Dorsalis pedis pulses are 2+ on the right side, and 2+ on the left side.  No edema  Pulmonary/Chest: Effort normal and breath sounds normal. No accessory muscle usage. No respiratory distress. She has no decreased breath sounds. She has no wheezes. She has no rhonchi. She has no rales. She exhibits no tenderness.  Abdominal: Soft. Normal appearance and bowel sounds are normal. She exhibits no distension and no ascites. There is no tenderness.  Musculoskeletal: She exhibits no edema or tenderness.       Right ankle: She exhibits decreased range of motion.  Expected osteoarthritis, stiffness; Bilateral Calves soft, supple. Negative Homan's Sign. B- pedal pulses equal; [place] for ambulation- 25% WB status  Neurological: She is alert and oriented to person, place, and time. She has normal strength. Coordination and gait abnormal.  Skin: Skin is warm, dry and intact. She is not diaphoretic. No cyanosis. No pallor. Nails show no clubbing.  Psychiatric: She has a normal mood and affect. Her speech is normal and behavior is normal. Judgment and thought content normal. Cognition and memory are normal.  Nursing note and vitals reviewed.   Labs reviewed: Basic Metabolic Panel: Recent Labs    08/07/17 0449 08/20/17 0946 09/10/17 0904  NA 137 137 136  K 3.8 4.0 4.1  CL 104 101 101  CO2 _0 GLUCOSE 96 132* 108*  BUN _1 CREATININE 0.75 0.82 0.76  CALCIUM 8.2* 8.7* 9.0   Liver Function Tests: Recent Labs    08/07/17 0449 08/20/17 0946 09/10/17 0904  AST _2 ALT _3 ALKPHOS 45 58 64  BILITOT 0.5 0.4 0.7  PROT 5.6* 6.5 6.7  ALBUMIN 3.4* 3.7 3.9   No  results for input(s): LIPASE, AMYLASE in the last 8760 hours. No results for input(s): AMMONIA in the last 8760 hours. CBC: Recent Labs    08/05/17 0923 08/20/17 0946 09/10/17 0904  WBC 6.3 6.9 4.8  NEUTROABS 4.6 5.5 3.2  HGB 12.5 11.4* 12.1  HCT 37.4 34.4* 37.0  MCV 92.8 91.3 90.6  PLT 360 367 291   Cardiac Enzymes: Recent Labs    01/04/17 2058 01/05/17 0035 08/05/17 0923  TROPONINI <0.03 <0.03 <0.03   BNP: Invalid input(s): POCBNP CBG: Recent Labs    05/13/17 0727  GLUCAP 89    Procedures and Imaging Studies During Stay: Mr Jeri Cos GO Contrast  Result Date: 09/01/2017 CLINICAL DATA:  Altered level of consciousness. Gait instability. History of carcinoma breast. History of meningioma resection 1995 EXAM: MRI HEAD WITHOUT AND WITH CONTRAST TECHNIQUE: Multiplanar, multiecho pulse sequences of the brain and surrounding structures were obtained without and with intravenous contrast. CONTRAST:  33m MULTIHANCE GADOBENATE DIMEGLUMINE 529 MG/ML IV SOLN COMPARISON:  MRI 05/18/2017 FINDINGS: Brain: Postop left frontal craniotomy, presumably for meningioma resection. Mild encephalomalacia in the left frontal lobe. No recurrent tumor in this area. No enhancing metastatic deposits in the brain. Moderate white matter hyperintensity bilaterally unchanged from the prior study. Mild hyperintensity in the pons unchanged. No acute infarct. Chronic microhemorrhage in the pons and left superior cerebellum unchanged from the prior MRI. Vascular: Normal arterial flow voids Skull and upper cervical spine: Negative Sinuses/Orbits: Negative Other: None IMPRESSION: Negative for metastatic disease to the brain. No change from the prior MRI. Electronically Signed   By: CFranchot GalloM.D.   On: 09/01/2017 12:04  Assessment/Plan:   Closed fracture of right ankle with routine healing, subsequent encounter  Continue PT/OT  Continue exercises as taught by PT/OT  Continue ice pack prn for pain,  edema  Skin care per protocol/ instruction  [place] for ambulation  25% WB to RLE  Ambulate with Rolling walker  Continue Norco 5/325 mg 1 po Q 4 hours prn pain #30, no refill  Continue other home medications  Continue ASA 325 mg po BID for DVT prophylaxis  Follow up with Orthopedist as instructed for continuity of care    Patient is being discharged with the following home health services: HHPT/OT through [facility]   Patient is being discharged with the following durable medical equipment:  RW, [facility], wheelchair- arranged by husband   Patient has been advised to f/u with their PCP in 1-2 weeks to bring them up to date on their rehab stay.  Social services at facility was responsible for arranging this appointment.  Pt was provided with a 30 day supply of prescriptions for medications and refills must be obtained from their PCP.  For controlled substances, a more limited supply may be provided adequate until PCP appointment only.  Future labs/tests needed:    Family/ staff Communication:   Total Time:  Documentation:  Face to Face:  Family/Phone:  Vikki Ports, NP-C [facility] [address], [place] [address] Cell Phone (Mon-Fri 8am-5pm):  [phone] On Call:  [phone] & follow prompts after 5pm & weekends Office Phone:  [phone] Office Fax:  [phone]

## 2017-09-25 ENCOUNTER — Ambulatory Visit
Admission: RE | Admit: 2017-09-25 | Discharge: 2017-09-25 | Disposition: A | Discharge status: Home / Self Care | Payer: Medicare Other | Source: Ambulatory Visit | Attending: Radiation Oncology | Admitting: Radiation Oncology

## 2017-09-25 ENCOUNTER — Encounter: Payer: Self-pay | Admitting: Radiation Oncology

## 2017-09-25 ENCOUNTER — Other Ambulatory Visit: Payer: Self-pay

## 2017-09-25 VITALS — BP 114/70 | HR 79 | Temp 96.3°F | Resp 18

## 2017-09-25 DIAGNOSIS — F0390 Unspecified dementia without behavioral disturbance: Secondary | ICD-10-CM | POA: Insufficient documentation

## 2017-09-25 DIAGNOSIS — Z171 Estrogen receptor negative status [ER-]: Secondary | ICD-10-CM | POA: Insufficient documentation

## 2017-09-25 DIAGNOSIS — C50812 Malignant neoplasm of overlapping sites of left female breast: Secondary | ICD-10-CM | POA: Insufficient documentation

## 2017-09-25 DIAGNOSIS — Z9181 History of falling: Secondary | ICD-10-CM | POA: Diagnosis not present

## 2017-09-25 DIAGNOSIS — C7951 Secondary malignant neoplasm of bone: Secondary | ICD-10-CM | POA: Insufficient documentation

## 2017-09-25 DIAGNOSIS — Z923 Personal history of irradiation: Secondary | ICD-10-CM | POA: Insufficient documentation

## 2017-09-25 DIAGNOSIS — R55 Syncope and collapse: Secondary | ICD-10-CM | POA: Diagnosis not present

## 2017-09-25 DIAGNOSIS — C773 Secondary and unspecified malignant neoplasm of axilla and upper limb lymph nodes: Secondary | ICD-10-CM

## 2017-09-25 DIAGNOSIS — C50912 Malignant neoplasm of unspecified site of left female breast: Secondary | ICD-10-CM

## 2017-09-25 NOTE — Progress Notes (Signed)
Radiation Oncology Follow up Note  Name: [name]   Date:   09/25/2017 MRN:  [id number] DOB: [birth year]    This 77 y.o. 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 Tumor is gone on to have. 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 She is currently on Herceptin and per [place] alone. 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 She is accompanied by her husband. She does have some sternal pain although on recent CT scans left parasternal soft tissue mass has been reduced. She recently had a fall secondary to a syncopal episode and is seeing Dr. Scot Dock neurology for that and has recently been put on seizure medication.  COMPLICATIONS OF TREATMENT: none  FOLLOW UP COMPLIANCE: keeps appointments   PHYSICAL EXAM:  BP 114/70   Pulse 79   Temp (!) 96.3 F (35.7 C)   Resp 68  Well-developed wheelchair-bound female in NAD. She does have some pain in her left axilla and deep palpation of her sternum does elicit some pain. Well-developed well-nourished patient in NAD. HEENT reveals PERLA, EOMI, discs not visualized.  Oral cavity is clear. No oral mucosal lesions are identified. Neck is clear without evidence of cervical or supraclavicular adenopathy. Lungs are clear to A&P. Cardiac examination is essentially unremarkable with regular rate and rhythm without murmur rub or thrill. Abdomen is benign with no organomegaly or masses noted. Motor sensory and DTR levels are equal and symmetric in the upper  and lower extremities. Cranial nerves II through XII are grossly intact. Proprioception is intact. No peripheral adenopathy or edema is identified. No motor or sensory levels are noted. Crude visual fields are within normal range.  RADIOLOGY RESULTS: MRI scan of brain CT scan of chest abdomen and pelvis reviewed and compatible with the above-stated findings  PLAN: Present time patient will continue in the care of medical oncology. I'm going to turn follow-up care over to medical oncology. We'll be happy to reevaluate the patient should she need any further radiation therapy. Do not see a role for palliation of her sternum since it had is an excellent response by CT criteria at this time. Patient and family know to call with any concerns.  I would like to take this opportunity to thank you for allowing me to participate in the care of your patient.Noreene Filbert, MD

## 2017-09-26 DIAGNOSIS — G40909 Epilepsy, unspecified, not intractable, without status epilepticus: Secondary | ICD-10-CM | POA: Insufficient documentation

## 2017-09-29 ENCOUNTER — Ambulatory Visit
Admission: RE | Admit: 2017-09-29 | Discharge: 2017-09-29 | Disposition: A | Discharge status: Home / Self Care | Payer: Medicare Other | Source: Ambulatory Visit | Attending: Internal Medicine | Admitting: Internal Medicine

## 2017-09-29 DIAGNOSIS — C50812 Malignant neoplasm of overlapping sites of left female breast: Secondary | ICD-10-CM | POA: Diagnosis not present

## 2017-09-29 DIAGNOSIS — Z5181 Encounter for therapeutic drug level monitoring: Secondary | ICD-10-CM | POA: Insufficient documentation

## 2017-09-29 DIAGNOSIS — Z79899 Other long term (current) drug therapy: Secondary | ICD-10-CM | POA: Diagnosis not present

## 2017-09-29 DIAGNOSIS — Z171 Estrogen receptor negative status [ER-]: Secondary | ICD-10-CM | POA: Diagnosis not present

## 2017-09-29 MED ORDER — TECHNETIUM TC 99M-LABELED RED BLOOD CELLS IV KIT
22.4070 | PACK | Freq: Once | INTRAVENOUS | Status: AC | PRN
  Administered 2017-09-29: 22.407 via INTRAVENOUS

## 2017-10-01 ENCOUNTER — Inpatient Hospital Stay: Payer: Medicare Other | Attending: Internal Medicine

## 2017-10-01 ENCOUNTER — Other Ambulatory Visit: Payer: Self-pay

## 2017-10-01 ENCOUNTER — Inpatient Hospital Stay (HOSPITAL_BASED_OUTPATIENT_CLINIC_OR_DEPARTMENT_OTHER): Payer: Medicare Other | Admitting: Internal Medicine

## 2017-10-01 ENCOUNTER — Encounter: Payer: Self-pay | Admitting: Internal Medicine

## 2017-10-01 ENCOUNTER — Inpatient Hospital Stay: Payer: Medicare Other

## 2017-10-01 VITALS — BP 119/77 | HR 94 | Temp 98.0°F | Resp 20 | Ht 63.0 in | Wt 163.0 lb

## 2017-10-01 VITALS — BP 110/65 | HR 81 | Temp 97.7°F | Resp 18

## 2017-10-01 DIAGNOSIS — Z171 Estrogen receptor negative status [ER-]: Secondary | ICD-10-CM | POA: Insufficient documentation

## 2017-10-01 DIAGNOSIS — C50812 Malignant neoplasm of overlapping sites of left female breast: Secondary | ICD-10-CM

## 2017-10-01 DIAGNOSIS — M545 Low back pain, unspecified: Secondary | ICD-10-CM | POA: Insufficient documentation

## 2017-10-01 DIAGNOSIS — Z5112 Encounter for antineoplastic immunotherapy: Secondary | ICD-10-CM | POA: Diagnosis not present

## 2017-10-01 DIAGNOSIS — F0390 Unspecified dementia without behavioral disturbance: Secondary | ICD-10-CM | POA: Diagnosis not present

## 2017-10-01 DIAGNOSIS — R270 Ataxia, unspecified: Secondary | ICD-10-CM

## 2017-10-01 DIAGNOSIS — R339 Retention of urine, unspecified: Secondary | ICD-10-CM | POA: Insufficient documentation

## 2017-10-01 LAB — CBC WITH DIFFERENTIAL/PLATELET
Basophils Absolute: 0 10*3/uL (ref 0–0.1)
Basophils Relative: 1 %
EOS PCT: 2 %
Eosinophils Absolute: 0.2 10*3/uL (ref 0–0.7)
HCT: 36.9 % (ref 35.0–47.0)
Hemoglobin: 12.4 g/dL (ref 12.0–16.0)
LYMPHS ABS: 0.8 10*3/uL — AB (ref 1.0–3.6)
LYMPHS PCT: 9 %
MCH: 30.1 pg (ref 26.0–34.0)
MCHC: 33.7 g/dL (ref 32.0–36.0)
MCV: 89.2 fL (ref 80.0–100.0)
MONO ABS: 0.5 10*3/uL (ref 0.2–0.9)
MONOS PCT: 6 %
Neutro Abs: 6.8 10*3/uL — ABNORMAL HIGH (ref 1.4–6.5)
Neutrophils Relative %: 82 %
PLATELETS: 348 10*3/uL (ref 150–440)
RBC: 4.13 MIL/uL (ref 3.80–5.20)
RDW: 15 % — AB (ref 11.5–14.5)
WBC: 8.3 10*3/uL (ref 3.6–11.0)

## 2017-10-01 LAB — COMPREHENSIVE METABOLIC PANEL
ALBUMIN: 3.6 g/dL (ref 3.5–5.0)
ALT: 14 U/L (ref 14–54)
AST: 23 U/L (ref 15–41)
Alkaline Phosphatase: 73 U/L (ref 38–126)
Anion gap: 10 (ref 5–15)
BUN: 12 mg/dL (ref 6–20)
CHLORIDE: 98 mmol/L — AB (ref 101–111)
CO2: 28 mmol/L (ref 22–32)
Calcium: 8.9 mg/dL (ref 8.9–10.3)
Creatinine, Ser: 0.64 mg/dL (ref 0.44–1.00)
GFR calc Af Amer: >60 mL/min (ref >60)
GFR calc non Af Amer: >60 mL/min (ref >60)
GLUCOSE: 159 mg/dL — AB (ref 65–99)
POTASSIUM: 3.7 mmol/L (ref 3.5–5.1)
Sodium: 136 mmol/L (ref 135–145)
TOTAL PROTEIN: 7 g/dL (ref 6.5–8.1)
Total Bilirubin: 0.5 mg/dL (ref 0.3–1.2)

## 2017-10-01 LAB — URINALYSIS, COMPLETE (UACMP) WITH MICROSCOPIC
Bacteria, UA: NONE SEEN
Bilirubin Urine: NEGATIVE
GLUCOSE, UA: NEGATIVE mg/dL
Hgb urine dipstick: NEGATIVE
KETONES UR: NEGATIVE mg/dL
Leukocytes, UA: NEGATIVE
Nitrite: NEGATIVE
PH: 5 (ref 5.0–8.0)
PROTEIN: NEGATIVE mg/dL
RBC / HPF: NONE SEEN RBC/hpf (ref 0–5)
Specific Gravity, Urine: 1.011 (ref 1.005–1.030)

## 2017-10-01 MED ORDER — DIPHENHYDRAMINE HCL 50 MG/ML IJ SOLN
12.5000 mg | Freq: Once | INTRAMUSCULAR | Status: AC
  Administered 2017-10-01: 12.5 mg via INTRAVENOUS
  Filled 2017-10-01: qty 1

## 2017-10-01 MED ORDER — SODIUM CHLORIDE 0.9% FLUSH
10.0000 mL | INTRAVENOUS | Status: DC | PRN
  Administered 2017-10-01: 10 mL via INTRAVENOUS
  Filled 2017-10-01: qty 10

## 2017-10-01 MED ORDER — SODIUM CHLORIDE 0.9 % IV SOLN
Freq: Once | INTRAVENOUS | Status: AC
  Administered 2017-10-01: 11:00:00 via INTRAVENOUS
  Filled 2017-10-01: qty 1000

## 2017-10-01 MED ORDER — HEPARIN SOD (PORK) LOCK FLUSH 100 UNIT/ML IV SOLN
500.0000 [IU] | Freq: Once | INTRAVENOUS | Status: AC
  Administered 2017-10-01: 500 [IU] via INTRAVENOUS
  Filled 2017-10-01: qty 5

## 2017-10-01 MED ORDER — SODIUM CHLORIDE 0.9 % IV SOLN
420.0000 mg | Freq: Once | INTRAVENOUS | Status: AC
  Administered 2017-10-01: 420 mg via INTRAVENOUS
  Filled 2017-10-01: qty 14

## 2017-10-01 MED ORDER — TRASTUZUMAB CHEMO 150 MG IV SOLR
450.0000 mg | Freq: Once | INTRAVENOUS | Status: AC
  Administered 2017-10-01: 450 mg via INTRAVENOUS
  Filled 2017-10-01: qty 21.43

## 2017-10-01 MED ORDER — ACETAMINOPHEN 325 MG PO TABS
650.0000 mg | ORAL_TABLET | Freq: Once | ORAL | Status: AC
  Administered 2017-10-01: 650 mg via ORAL
  Filled 2017-10-01: qty 2

## 2017-10-01 NOTE — Progress Notes (Signed)
Pt denies to stay 30 minute post observation. Pt and VS stable at discharge.

## 2017-10-01 NOTE — Progress Notes (Signed)
[place] OFFICE PROGRESS NOTE  Patient Care Team: [name], [name], MD (General Surgery) Requested, Self  Breast cancer metastasized to axillary lymph node [facility])   Staging form: Breast, [name], [name], [name], MD on 01/29/2015    Oncology History   # June 2016- LEFT BREAST CA;  [name] ; [name] ; [name]; Her 2 Neu POS; [place] from July OF 2016; [name]; adjuvant Herceptin [ Finished July 2017]; AUG 2017- Neratinib x5 days; DISCON sec to diarrhea  # MID OCT 2018- Right breast mass-Bx- ER/PR-NEG; Her 2 NEU POSITIVE 1~2.5cm;  [?NEW primary]  # MID-OCT 2018-METASTATIC RECURRENT-oh sternal mass; Left Ax LN [Bx]/periportal LN  # OCT 25th 2018- TAXOL-HERCEPTIN-PERJETA; Jan 2019- CT PR; continue HP only.    -------------------------------------------------------------------------------   # MUGA scan- July 28th 2017- 67%.  # chronic gait/balance issues  # June 2017- left breast Bx- fat necrosis [[name]  # july 2017-  BRCA 1& 2- NEG.   MOLECULAR TESTING- F ONE- TPS- 0%; pending; ERB2 amplification; PI3K/RET amplification Others**     Carcinoma of overlapping sites of left breast in female, estrogen receptor negative ([place])    INTERVAL HISTORY: Patient a poor historian given mild dementia.   [name] 77 y.o.  female pleasant patient above history of  recurrent/metastatic breast cancer ER/[name] HER-2/neu positive currently on Herceptin-perjeta maintenance is here for follow-up.  Patient has been started on Keppra by neurology given the concerns of possible seizures that led to the fall.  MRI brain negative for any malignancy.  Patient denies any headaches.  Denies any nausea vomiting.  As per husband patient has urinary  incontinence/dribbling.  Awaiting to see urology.  REVIEW OF SYSTEMS:  A complete 10 point review of system is done which is negative except mentioned above/history of present illness.   PAST MEDICAL HISTORY :  Past Medical History:  Diagnosis Date  . Arthritis   . 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 Breast cancer ([place]) 2016   left breast; surgery 01/11/15 with Dr. Bary Castilla; path with invasive mammary and DCIS  . Breast cancer of upper-inner quadrant of left female breast ([place]) 12/15/14   Completed radiation end of December and finished chemotherapy 2 weeks ago, Left breast invasive mammary carcinoma, T1cN40mc (1.5 cm); Grade 3, [name], [name], [name], .  .Marland KitchenCataract    bilat   . DDD (degenerative disc disease), lumbar    Lumbar, previously evaluated by Dr. HEarnestine Leys . Dementia   . Fibrocystic breast disease    prior biopsy  . Glaucoma   . 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 Hearing loss   . History of cancer chemotherapy   . History of radiation therapy   . Hyperlipidemia, unspecified   . Imbalance   . Memory impairment    seen by Dr SManuella Ghazi possible post crainiotomy from radiation  . [place]    Left cavernous sinus meningioma, [facility].  . Numbness and tingling    right hand   . Osteoarthritis   . Osteoporosis, post-menopausal   . Shoulder pain, right   . Wears  glasses     PAST SURGICAL HISTORY :   Past Surgical History:  Procedure Laterality Date  . APPENDECTOMY  1950  . BRAIN SURGERY  1995   left frontal/temporal  . BREAST BIOPSY Left 1997  . BREAST BIOPSY Left 12/15/14   confirmed DCIS  . BREAST BIOPSY Left 01/08/2015   Procedure: BREAST BIOPSY WITH NEEDLE LOCALIZATION;  Surgeon: [name], MD;  Location: [facility] ORS;  Service: General;  Laterality: Left;  . BREAST LUMPECTOMY Left 01/08/2015   Procedure: LUMPECTOMY;  Surgeon: [name],  MD;  Location: [facility] ORS;  Service: General;  Laterality: Left;  . COLONOSCOPY  2010   Dr. [name]  . ORIF ANKLE FRACTURE Right 08/05/2017   Procedure: OPEN REDUCTION INTERNAL FIXATION (ORIF) ANKLE FRACTURE;  Surgeon: Earnestine Leys, MD;  Location: [facility] ORS;  Service: Orthopedics;  Laterality: Right;  . PORTACATH PLACEMENT Right 01/16/2015   Procedure: INSERTION PORT-A-CATH;  Surgeon: [name], MD;  Location: [facility] ORS;  Service: General;  Laterality: Right;  . SENTINEL NODE BIOPSY Left 01/16/2015   Procedure: SENTINEL NODE BIOPSY;  Surgeon: [name], MD;  Location: [facility] ORS;  Service: General;  Laterality: Left;  . TOTAL HIP ARTHROPLASTY Right 09/04/2015   Procedure: RIGHT TOTAL HIP ARTHROPLASTY ANTERIOR APPROACH;  Surgeon: [name], MD;  Location: [facility];  Service: Orthopedics;  Laterality: Right;    FAMILY HISTORY :   Family History  Problem Relation Age of Onset  . Breast cancer Sister 78  . Addison's disease Mother   . Hyperthyroidism Mother   . Osteoarthritis Mother   . Stroke Father   . Heart disease Father   . High blood pressure Father     SOCIAL HISTORY:   Social History   Tobacco Use  . Smoking status: Former Smoker    Packs/day: 0.25    Years: 5.00    Pack years: 1.25    Types: Cigarettes    Last attempt to quit: 07/28/1972    Years since quitting: 45.2  . Smokeless tobacco: Never Used  Substance Use Topics  . Alcohol use: Yes    Alcohol/week: 0.6 - 1.2 oz    Types: 1 - 2 Glasses of wine per week    Comment: 1 Glass Wine / Night  . Drug use: No    ALLERGIES:  is allergic to donepezil.  MEDICATIONS:  Current Outpatient Medications  Medication Sig Dispense Refill  . aspirin 325 MG EC tablet Take 325 mg by mouth 2 (two) times daily.     . Calcium Carbonate-Vit D-Min (CALTRATE 600+D PLUS MINERALS) 600-800 MG-UNIT TABS Take 1 tablet by mouth every morning.     . celecoxib (CELEBREX) 200 MG capsule Take 200 mg by mouth 2 (two) times daily.    .  Cholecalciferol (VITAMIN D-3) 5000 units TABS Take 1 tablet by mouth daily.    Marland Kitchen HYDROcodone-acetaminophen (NORCO/VICODIN) 5-325 MG tablet Take 1 tablet by mouth every 4 (four) hours as needed for moderate pain ((score 4 to 6)). 30 tablet 0  . levETIRAcetam (KEPPRA) 250 MG tablet 1 tablet daily at bedtime  5  . loratadine (CLARITIN) 10 MG tablet Take 10 mg by mouth daily as needed for allergies.     Marland Kitchen traMADol (ULTRAM) 50 MG tablet Take 50 mg by mouth 2 (two) times daily.    . vitamin C (ASCORBIC ACID) 250 MG tablet Take 250 mg by mouth daily.    . vitamin E 400 UNIT capsule Take 400 Units by mouth daily.     Marland Kitchen  acetaminophen (TYLENOL) 325 MG tablet Take 650 mg by mouth every 4 (four) hours as needed. Pain / increased temp.    . bisacodyl (DULCOLAX) 10 MG suppository Place 1 suppository (10 mg total) rectally daily as needed for moderate constipation. (Patient not taking: Reported on 10/01/2017) 12 suppository 0  . prochlorperazine (COMPAZINE) 10 MG tablet Take 1 tablet (10 mg total) every 6 (six) hours as needed by mouth for nausea or vomiting. (Patient not taking: Reported on 10/01/2017) 40 tablet 1  . senna (SENOKOT) 8.6 MG tablet Take 1 tablet by mouth 2 (two) times daily.      No current facility-administered medications for this visit.     PHYSICAL EXAMINATION: ECOG PERFORMANCE STATUS: 1 - Symptomatic but completely ambulatory  BP 119/77 (BP Location: Right Arm, Patient Position: Sitting)   Pulse 94   Temp 98 F (36.7 C) (Tympanic)   Resp 20   Ht _0  (1.6 m)   Wt 163 lb (73.9 kg)   BMI 28.87 kg/m   Filed Weights   10/01/17 0947  Weight: 163 lb (73.9 kg)    GENERAL: Well-nourished well-developed; Alert, no distress and comfortable.  With her husband.  In a wheelchair. EYES: no pallor or icterus OROPHARYNX: no thrush or ulceration; good dentition  NECK: supple, no masses felt LYMPH:  palpable lymphadenopathy Left supraclavicular region ; left cervical region. Adenopathy felt in  the left axillary/improved. LUNGS: clear to auscultation and  No wheeze or crackles HEART/CVS: regular rate & rhythm and no murmurs; No lower extremity edema; right lower extremity Ace wrap. ABDOMEN:abdomen soft, non-tender and normal bowel sounds Musculoskeletal:no cyanosis of digits and no clubbing;  PSYCH: alert & oriented x 3 with fluent speech NEURO: no focal motor/sensory deficits SKIN:  no rashes or significant lesions.   LABORATORY DATA:  I have reviewed the data as listed    Component Value Date/Time   NA 136 10/01/2017 0915   K 3.7 10/01/2017 0915   CL 98 (L) 10/01/2017 0915   CO2 28 10/01/2017 0915   GLUCOSE 159 (H) 10/01/2017 0915   BUN 12 10/01/2017 0915   CREATININE 0.64 10/01/2017 0915   CALCIUM 8.9 10/01/2017 0915   PROT 7.0 10/01/2017 0915   ALBUMIN 3.6 10/01/2017 0915   AST 23 10/01/2017 0915   ALT 14 10/01/2017 0915   ALKPHOS 73 10/01/2017 0915   BILITOT 0.5 10/01/2017 0915   GFRNONAA >60 10/01/2017 0915   GFRAA >60 10/01/2017 0915    No results found for: SPEP, UPEP  Lab Results  Component Value Date   WBC 8.3 10/01/2017   NEUTROABS 6.8 (H) 10/01/2017   HGB 12.4 10/01/2017   HCT 36.9 10/01/2017   MCV 89.2 10/01/2017   PLT 348 10/01/2017      Chemistry      Component Value Date/Time   NA 136 10/01/2017 0915   K 3.7 10/01/2017 0915   CL 98 (L) 10/01/2017 0915   CO2 28 10/01/2017 0915   BUN 12 10/01/2017 0915   CREATININE 0.64 10/01/2017 0915      Component Value Date/Time   CALCIUM 8.9 10/01/2017 0915   ALKPHOS 73 10/01/2017 0915   AST 23 10/01/2017 0915   ALT 14 10/01/2017 0915   BILITOT 0.5 10/01/2017 0915     No  focal wall motion abnormality of the left ventricle. Calculated left ventricular ejection fraction equals 67%. Previously 61% on 11/13/2015 IMPRESSION: Left ventricular ejection fraction equals 67 %. Electronically Signed   By: Helane Gunther.D.  On: 02/20/2016 15:02 -----------------------------------------------      IMPRESSION: 1. Prior thoracic and abdominal adenopathy has essentially resolved. Notably reduced size of the left parasternal soft tissue mass extending into the mediastinum. The previously hypermetabolic right lower breast nodule has also essentially resolved. 2. Diffuse sclerosis throughout the sternal manubrium, likely representing response to therapy related to prior manubrial metastatic lesion. There is a suspected pathologic fracture along the inferior portion of the manubrium best seen on the parasagittal images. 3. 4 cm right adnexal cyst, not previously hypermetabolic. Cysts of this size in this age group typically warrant pelvic sonography for definitive characterization. 4. Other imaging findings of potential clinical significance: Pelvic floor laxity with small cystocele. Aortic Atherosclerosis (ICD10-I70.0). Calcified uterine fibroid.  RADIOGRAPHIC STUDIES: I have personally reviewed the radiological images as listed and agreed with the findings in the report. No results found.   ASSESSMENT & PLAN:  Carcinoma of overlapping sites of left breast in female, estrogen receptor negative ([place]) Problem is # Metastatic ER/[name] HER-2/neu positive- currently on Taxol Herceptin perjeta; she is currently status post cycle #3.  CT scan Jan 2019 shows partial response. March 5th EF- 61%  # Proceed with cycle #5 H-P only.  CBC CMP are reviewed; adequate today; no contraindications to chemotherapy. Proceed with treatment today.   # ? Syncope/falls-on Keppra as per Dr.Shah.   # Low back pain- MRI lumbar spine STAT; Dr.Miller appt in 2 weeks.   # urinary retention- UA today; defer to Urology; Dr.Wolfe.   # Dementia-/chronic debility/ataxia- STABLE; on PT/OT.   #Follow-up in 3 weeks labs Herceptin perjeta.   Orders Placed This Encounter  Procedures  . MR Lumbar Spine W Wo Contrast    Standing Status:   Future    Number of Occurrences:   1    Standing Expiration Date:    10/01/2018    Order Specific Question:   If indicated for the ordered procedure, I authorize the administration of contrast media per Radiology protocol    Answer:   Yes    Order Specific Question:   What is the patient's sedation requirement?    Answer:   No Sedation    Order Specific Question:   Does the patient have a pacemaker or implanted devices?    Answer:   No    Order Specific Question:   Radiology Contrast Protocol - do NOT remove file path    Answer:   [email or web address]    Order Specific Question:   Preferred imaging location?    Answer:   [facility] (table limit-300lbs)  . Urinalysis, Complete w Microscopic    Standing Status:   Future    Number of Occurrences:   1    Standing Expiration Date:   11/01/2017   All questions were answered. The patient knows to call the clinic with any problems, questions or concerns.      Cammie Sickle, MD 10/13/2017 3:31 PM

## 2017-10-01 NOTE — Assessment & Plan Note (Addendum)
Problem is # Metastatic ER/[name] HER-2/neu positive- currently on Taxol Herceptin perjeta; she is currently status post cycle #3.  CT scan Jan 2019 shows partial response. March 5th EF- 61%  # Proceed with cycle #5 H-P only.  CBC CMP are reviewed; adequate today; no contraindications to chemotherapy. Proceed with treatment today.   # ? Syncope/falls-on Keppra as per Dr.Shah.   # Low back pain- MRI lumbar spine STAT; Dr.Miller appt in 2 weeks.   # urinary retention- UA today; defer to Urology; Dr.Wolfe.   # Dementia-/chronic debility/ataxia- STABLE; on PT/OT.   #Follow-up in 3 weeks labs Herceptin perjeta.

## 2017-10-01 NOTE — Progress Notes (Signed)
Patient reports chronic low back pain. Frequent urge to void without output. Patient denies any urinary burning.

## 2017-10-02 ENCOUNTER — Ambulatory Visit
Admission: RE | Admit: 2017-10-02 | Discharge: 2017-10-02 | Disposition: A | Discharge status: Home / Self Care | Payer: Medicare Other | Source: Ambulatory Visit | Attending: Internal Medicine | Admitting: Internal Medicine

## 2017-10-02 ENCOUNTER — Telehealth: Payer: Self-pay | Admitting: Internal Medicine

## 2017-10-02 ENCOUNTER — Other Ambulatory Visit: Payer: Self-pay | Admitting: Gastroenterology

## 2017-10-02 DIAGNOSIS — M48061 Spinal stenosis, lumbar region without neurogenic claudication: Secondary | ICD-10-CM | POA: Diagnosis not present

## 2017-10-02 DIAGNOSIS — C50812 Malignant neoplasm of overlapping sites of left female breast: Secondary | ICD-10-CM

## 2017-10-02 DIAGNOSIS — Z171 Estrogen receptor negative status [ER-]: Secondary | ICD-10-CM | POA: Diagnosis present

## 2017-10-02 DIAGNOSIS — M5126 Other intervertebral disc displacement, lumbar region: Secondary | ICD-10-CM | POA: Insufficient documentation

## 2017-10-02 DIAGNOSIS — M545 Low back pain, unspecified: Secondary | ICD-10-CM

## 2017-10-02 MED ORDER — HEPARIN SOD (PORK) LOCK FLUSH 100 UNIT/ML IV SOLN
500.0000 [IU] | INTRAVENOUS | Status: AC | PRN
  Administered 2017-10-02: 500 [IU]

## 2017-10-02 MED ORDER — GADOBENATE DIMEGLUMINE 529 MG/ML IV SOLN
15.0000 mL | Freq: Once | INTRAVENOUS | Status: AC | PRN
  Administered 2017-10-02: 15 mL via INTRAVENOUS

## 2017-10-02 NOTE — Telephone Encounter (Signed)
Spoke to patient's husband regarding the results of MRI- no concerns for metastatic disease. Recommended for follow-up with orthopedics; Dr.Miller as planned.

## 2017-10-21 ENCOUNTER — Inpatient Hospital Stay (HOSPITAL_BASED_OUTPATIENT_CLINIC_OR_DEPARTMENT_OTHER): Payer: Medicare Other | Admitting: Internal Medicine

## 2017-10-21 ENCOUNTER — Encounter: Payer: Self-pay | Admitting: Internal Medicine

## 2017-10-21 ENCOUNTER — Other Ambulatory Visit: Payer: Self-pay

## 2017-10-21 ENCOUNTER — Inpatient Hospital Stay: Payer: Medicare Other

## 2017-10-21 VITALS — BP 130/62 | HR 84 | Temp 97.6°F | Resp 18

## 2017-10-21 DIAGNOSIS — Z171 Estrogen receptor negative status [ER-]: Secondary | ICD-10-CM

## 2017-10-21 DIAGNOSIS — F0390 Unspecified dementia without behavioral disturbance: Secondary | ICD-10-CM

## 2017-10-21 DIAGNOSIS — C50812 Malignant neoplasm of overlapping sites of left female breast: Secondary | ICD-10-CM

## 2017-10-21 DIAGNOSIS — Z5112 Encounter for antineoplastic immunotherapy: Secondary | ICD-10-CM | POA: Diagnosis not present

## 2017-10-21 LAB — COMPREHENSIVE METABOLIC PANEL
ALBUMIN: 3.9 g/dL (ref 3.5–5.0)
ALT: 19 U/L (ref 14–54)
ANION GAP: 11 (ref 5–15)
AST: 34 U/L (ref 15–41)
Alkaline Phosphatase: 75 U/L (ref 38–126)
BILIRUBIN TOTAL: 0.5 mg/dL (ref 0.3–1.2)
BUN: 13 mg/dL (ref 6–20)
CO2: 26 mmol/L (ref 22–32)
Calcium: 8.8 mg/dL — ABNORMAL LOW (ref 8.9–10.3)
Chloride: 101 mmol/L (ref 101–111)
Creatinine, Ser: 0.86 mg/dL (ref 0.44–1.00)
GFR calc Af Amer: >60 mL/min (ref >60)
GFR calc non Af Amer: >60 mL/min (ref >60)
GLUCOSE: 186 mg/dL — AB (ref 65–99)
Potassium: 3.7 mmol/L (ref 3.5–5.1)
Sodium: 138 mmol/L (ref 135–145)
TOTAL PROTEIN: 6.8 g/dL (ref 6.5–8.1)

## 2017-10-21 LAB — CBC WITH DIFFERENTIAL/PLATELET
BASOS ABS: 0 10*3/uL (ref 0–0.1)
Basophils Relative: 1 %
Eosinophils Absolute: 0.2 10*3/uL (ref 0–0.7)
Eosinophils Relative: 3 %
HCT: 40.1 % (ref 35.0–47.0)
Hemoglobin: 13.3 g/dL (ref 12.0–16.0)
LYMPHS ABS: 1 10*3/uL (ref 1.0–3.6)
LYMPHS PCT: 18 %
MCH: 29.6 pg (ref 26.0–34.0)
MCHC: 33.2 g/dL (ref 32.0–36.0)
MCV: 89.1 fL (ref 80.0–100.0)
MONO ABS: 0.3 10*3/uL (ref 0.2–0.9)
Monocytes Relative: 6 %
NEUTROS ABS: 4 10*3/uL (ref 1.4–6.5)
Neutrophils Relative %: 72 %
Platelets: 356 10*3/uL (ref 150–440)
RBC: 4.49 MIL/uL (ref 3.80–5.20)
RDW: 14.9 % — AB (ref 11.5–14.5)
WBC: 5.6 10*3/uL (ref 3.6–11.0)

## 2017-10-21 MED ORDER — ACETAMINOPHEN 325 MG PO TABS
650.0000 mg | ORAL_TABLET | Freq: Once | ORAL | Status: AC
  Administered 2017-10-21: 650 mg via ORAL
  Filled 2017-10-21: qty 2

## 2017-10-21 MED ORDER — SODIUM CHLORIDE 0.9 % IV SOLN
Freq: Once | INTRAVENOUS | Status: AC
  Administered 2017-10-21: 09:00:00 via INTRAVENOUS
  Filled 2017-10-21: qty 1000

## 2017-10-21 MED ORDER — HEPARIN SOD (PORK) LOCK FLUSH 100 UNIT/ML IV SOLN
500.0000 [IU] | Freq: Once | INTRAVENOUS | Status: AC | PRN
  Administered 2017-10-21: 500 [IU]
  Filled 2017-10-21: qty 5

## 2017-10-21 MED ORDER — SODIUM CHLORIDE 0.9 % IV SOLN
420.0000 mg | Freq: Once | INTRAVENOUS | Status: AC
  Administered 2017-10-21: 420 mg via INTRAVENOUS
  Filled 2017-10-21: qty 14

## 2017-10-21 MED ORDER — DIPHENHYDRAMINE HCL 50 MG/ML IJ SOLN
12.5000 mg | Freq: Once | INTRAMUSCULAR | Status: AC
  Administered 2017-10-21: 12.5 mg via INTRAVENOUS
  Filled 2017-10-21: qty 1

## 2017-10-21 MED ORDER — TRASTUZUMAB CHEMO 150 MG IV SOLR
450.0000 mg | Freq: Once | INTRAVENOUS | Status: AC
  Administered 2017-10-21: 450 mg via INTRAVENOUS
  Filled 2017-10-21: qty 21.43

## 2017-10-21 NOTE — Assessment & Plan Note (Addendum)
Problem is # Metastatic ER/[name] HER-2/neu positive-status post cycle #3- Taxol Herceptin perjeta; CT scan 4th Jan 2019 shows partial response. March 5th EF- 61%  # Proceed with cycle # 6 H-P only.  CBC CMP are reviewed; adequate today; no contraindications to chemotherapy. Proceed with treatment today. Will order scans at next visit.    # Low back pain- MRI lumbar spine- arthritic/dege changes s/p eval with Dr.Miller appt.   # Dementia-/chronic debility/ataxia- STABLE; on PT/OT.   #Follow-up in 3 weeks labs Herceptin perjeta.  

## 2017-10-21 NOTE — Progress Notes (Signed)
[name] OFFICE PROGRESS NOTE  Patient Care Team: [name], [name], MD (General Surgery) Requested, Self  Breast cancer metastasized to axillary lymph node [facility])   Staging form: Breast, [name], [name], [name], MD on 01/29/2015    Oncology History   # June 2016- LEFT BREAST CA;  [name] ; [name] ; [name]; Her 2 Neu POS; [place] from July OF 2016; [name]; adjuvant Herceptin [ Finished July 2017]; AUG 2017- Neratinib x5 days; DISCON sec to diarrhea  # MID OCT 2018- Right breast mass-Bx- ER/PR-NEG; Her 2 NEU POSITIVE 1~2.5cm;  [?NEW primary]  # MID-OCT 2018-METASTATIC RECURRENT-oh sternal mass; Left Ax LN [Bx]/periportal LN  # OCT 25th 2018- TAXOL-HERCEPTIN-PERJETA; Jan 2019- CT PR; continue HP only.    -------------------------------------------------------------------------------   # MUGA scan- July 28th 2017- 67%.  # chronic gait/balance issues  # June 2017- left breast Bx- fat necrosis [[name]  # july 2017-  BRCA 1& 2- NEG.   MOLECULAR TESTING- F ONE- TPS- 0%; pending; ERB2 amplification; PI3K/RET amplification Others**     Carcinoma of overlapping sites of left breast in female, estrogen receptor negative ([place])    INTERVAL HISTORY: Patient a poor historian given mild dementia.   [name] 77 y.o.  female pleasant patient above history of  recurrent/metastatic breast cancer ER/[name] HER-2/neu positive currently on Herceptin-perjeta maintenance is here for follow-up.  Patient interim seems to be improving overall.  Denies any new falls or seizure-like activity.  Her hip pain is improved.  She has been evaluated by orthopedics.  Her urinary incontinence/dribbling has improved status post antibiotics.  Denies any new lumps  or bumps.  Appetite is good.  REVIEW OF SYSTEMS:  A complete 10 point review of system is done which is negative except mentioned above/history of present illness.   PAST MEDICAL HISTORY :  Past Medical History:  Diagnosis Date  . Arthritis   . 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 Breast cancer ([name]) 2016   left breast; surgery 01/11/15 with Dr. Bary Castilla; path with invasive mammary and DCIS  . Breast cancer of upper-inner quadrant of left female breast ([place]) 12/15/14   Completed radiation end of December and finished chemotherapy 2 weeks ago, Left breast invasive mammary carcinoma, T1cN75mc (1.5 cm); Grade 3, [name], [name], [name], .  .Marland KitchenCataract    bilat   . DDD (degenerative disc disease), lumbar    Lumbar, previously evaluated by Dr. HEarnestine Leys . Dementia   . Fibrocystic breast disease    prior biopsy  . Glaucoma   . 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 Hearing loss   . History of cancer chemotherapy   . History of radiation therapy   . Hyperlipidemia, unspecified   . Imbalance   . Memory impairment    seen by Dr SManuella Ghazi possible post crainiotomy from radiation  . [place]    Left cavernous sinus meningioma, [facility]  . Numbness and tingling    right hand   . Osteoarthritis   . Osteoporosis, post-menopausal   . Shoulder pain, right   . Wears glasses  PAST SURGICAL HISTORY :   Past Surgical History:  Procedure Laterality Date  . APPENDECTOMY  1950  . BRAIN SURGERY  1995   left frontal/temporal  . BREAST BIOPSY Left 1997  . BREAST BIOPSY Left 12/15/14   confirmed DCIS  . BREAST BIOPSY Left 01/08/2015   Procedure: BREAST BIOPSY WITH NEEDLE LOCALIZATION;  Surgeon: [name], MD;  Location: [facility] ORS;  Service: General;  Laterality: Left;  . BREAST LUMPECTOMY Left 01/08/2015   Procedure: LUMPECTOMY;  Surgeon: [name], MD;  Location: [facility]  ORS;  Service: General;  Laterality: Left;  . COLONOSCOPY  2010   Dr. [name]  . ORIF ANKLE FRACTURE Right 08/05/2017   Procedure: OPEN REDUCTION INTERNAL FIXATION (ORIF) ANKLE FRACTURE;  Surgeon: Earnestine Leys, MD;  Location: [facility] ORS;  Service: Orthopedics;  Laterality: Right;  . PORTACATH PLACEMENT Right 01/16/2015   Procedure: INSERTION PORT-A-CATH;  Surgeon: [name], MD;  Location: [facility] ORS;  Service: General;  Laterality: Right;  . SENTINEL NODE BIOPSY Left 01/16/2015   Procedure: SENTINEL NODE BIOPSY;  Surgeon: [name], MD;  Location: [facility] ORS;  Service: General;  Laterality: Left;  . TOTAL HIP ARTHROPLASTY Right 09/04/2015   Procedure: RIGHT TOTAL HIP ARTHROPLASTY ANTERIOR APPROACH;  Surgeon: [name], MD;  Location: [facility] ORS;  Service: Orthopedics;  Laterality: Right;    FAMILY HISTORY :   Family History  Problem Relation Age of Onset  . Breast cancer Sister 75  . Addison's disease Mother   . Hyperthyroidism Mother   . Osteoarthritis Mother   . Stroke Father   . Heart disease Father   . High blood pressure Father     SOCIAL HISTORY:   Social History   Tobacco Use  . Smoking status: Former Smoker    Packs/day: 0.25    Years: 5.00    Pack years: 1.25    Types: Cigarettes    Last attempt to quit: 07/28/1972    Years since quitting: 45.2  . Smokeless tobacco: Never Used  Substance Use Topics  . Alcohol use: Yes    Alcohol/week: 0.6 - 1.2 oz    Types: 1 - 2 Glasses of wine per week    Comment: 1 Glass Wine / Night  . Drug use: No    ALLERGIES:  is allergic to donepezil.  MEDICATIONS:  Current Outpatient Medications  Medication Sig Dispense Refill  . aspirin 325 MG EC tablet Take 325 mg by mouth 2 (two) times daily.     . Calcium Carbonate-Vit D-Min (CALTRATE 600+D PLUS MINERALS) 600-800 MG-UNIT TABS Take 1 tablet by mouth every morning.     . celecoxib (CELEBREX) 200 MG capsule Take 200 mg by mouth 2 (two) times daily.    . Cholecalciferol  (VITAMIN D-3) 5000 units TABS Take 1 tablet by mouth daily.    Marland Kitchen levETIRAcetam (KEPPRA) 250 MG tablet 1 tablet daily at bedtime  5  . loratadine (CLARITIN) 10 MG tablet Take 10 mg by mouth daily as needed for allergies.     Marland Kitchen senna (SENOKOT) 8.6 MG tablet Take 1 tablet by mouth 2 (two) times daily.     . traMADol (ULTRAM) 50 MG tablet Take 50 mg by mouth 2 (two) times daily.    . vitamin C (ASCORBIC ACID) 250 MG tablet Take 250 mg by mouth daily.    . vitamin E 400 UNIT capsule Take 400 Units by mouth daily.     Marland Kitchen acetaminophen (TYLENOL) 325 MG tablet Take 650 mg by  mouth every 4 (four) hours as needed. Pain / increased temp.    . bisacodyl (DULCOLAX) 10 MG suppository Place 1 suppository (10 mg total) rectally daily as needed for moderate constipation. (Patient not taking: Reported on 10/01/2017) 12 suppository 0  . prochlorperazine (COMPAZINE) 10 MG tablet Take 1 tablet (10 mg total) every 6 (six) hours as needed by mouth for nausea or vomiting. (Patient not taking: Reported on 10/01/2017) 40 tablet 1   No current facility-administered medications for this visit.     PHYSICAL EXAMINATION: ECOG PERFORMANCE STATUS: 1 - Symptomatic but completely ambulatory  BP 130/62   Pulse 84   Temp 97.6 F (36.4 C) (Tympanic)   Resp 18   There were no vitals filed for this visit.  GENERAL: Well-nourished well-developed; Alert, no distress and comfortable.  With her husband.  She is walking with a walker. EYES: no pallor or icterus OROPHARYNX: no thrush or ulceration; good dentition  NECK: supple, no masses felt LYMPH:  palpable lymphadenopathy Left supraclavicular region ; left cervical region. Adenopathy felt in the left axillary/improved. LUNGS: clear to auscultation and  No wheeze or crackles HEART/CVS: regular rate & rhythm and no murmurs; No lower extremity edema; right lower extremity Ace wrap. ABDOMEN:abdomen soft, non-tender and normal bowel sounds Musculoskeletal:no cyanosis of digits and no  clubbing;  PSYCH: alert & oriented x 3 with fluent speech NEURO: no focal motor/sensory deficits SKIN:  no rashes or significant lesions.   LABORATORY DATA:  I have reviewed the data as listed    Component Value Date/Time   NA 138 10/21/2017 0844   K 3.7 10/21/2017 0844   CL 101 10/21/2017 0844   CO2 26 10/21/2017 0844   GLUCOSE 186 (H) 10/21/2017 0844   BUN 13 10/21/2017 0844   CREATININE 0.86 10/21/2017 0844   CALCIUM 8.8 (L) 10/21/2017 0844   PROT 6.8 10/21/2017 0844   ALBUMIN 3.9 10/21/2017 0844   AST 34 10/21/2017 0844   ALT 19 10/21/2017 0844   ALKPHOS 75 10/21/2017 0844   BILITOT 0.5 10/21/2017 0844   GFRNONAA >60 10/21/2017 0844   GFRAA >60 10/21/2017 0844    No results found for: SPEP, UPEP  Lab Results  Component Value Date   WBC 5.6 10/21/2017   NEUTROABS 4.0 10/21/2017   HGB 13.3 10/21/2017   HCT 40.1 10/21/2017   MCV 89.1 10/21/2017   PLT 356 10/21/2017      Chemistry      Component Value Date/Time   NA 138 10/21/2017 0844   K 3.7 10/21/2017 0844   CL 101 10/21/2017 0844   CO2 26 10/21/2017 0844   BUN 13 10/21/2017 0844   CREATININE 0.86 10/21/2017 0844      Component Value Date/Time   CALCIUM 8.8 (L) 10/21/2017 0844   ALKPHOS 75 10/21/2017 0844   AST 34 10/21/2017 0844   ALT 19 10/21/2017 0844   BILITOT 0.5 10/21/2017 0844     No  focal wall motion abnormality of the left ventricle. Calculated left ventricular ejection fraction equals 67%. Previously 61% on 11/13/2015 IMPRESSION: Left ventricular ejection fraction equals 67 %. Electronically Signed   By: Suzy Bouchard M.D.   On: 02/20/2016 15:02 -----------------------------------------------     IMPRESSION: 1. Prior thoracic and abdominal adenopathy has essentially resolved. Notably reduced size of the left parasternal soft tissue mass extending into the mediastinum. The previously hypermetabolic right lower breast nodule has also essentially resolved. 2. Diffuse sclerosis  throughout the sternal manubrium, likely representing response to therapy related  to prior manubrial metastatic lesion. There is a suspected pathologic fracture along the inferior portion of the manubrium best seen on the parasagittal images. 3. 4 cm right adnexal cyst, not previously hypermetabolic. Cysts of this size in this age group typically warrant pelvic sonography for definitive characterization. 4. Other imaging findings of potential clinical significance: Pelvic floor laxity with small cystocele. Aortic Atherosclerosis (ICD10-I70.0). Calcified uterine fibroid.  RADIOGRAPHIC STUDIES: I have personally reviewed the radiological images as listed and agreed with the findings in the report. No results found.   ASSESSMENT & PLAN:  Carcinoma of overlapping sites of left breast in female, estrogen receptor negative ([place]) Problem is # Metastatic ER/[name] HER-2/neu positive-status post cycle #3- Taxol Herceptin perjeta; CT scan 4th Jan 2019 shows partial response. March 5th EF- 61%  # Proceed with cycle # 6 H-P only.  CBC CMP are reviewed; adequate today; no contraindications to chemotherapy. Proceed with treatment today. Will order scans at next visit.    # Low back pain- MRI lumbar spine- arthritic/dege changes s/p eval with Dr.Miller appt.   # Dementia-/chronic debility/ataxia- STABLE; on PT/OT.   #Follow-up in 3 weeks labs Herceptin perjeta.   Orders Placed This Encounter  Procedures  . CBC with Differential/Platelet    Standing Status:   Future    Standing Expiration Date:   10/22/2018  . Comprehensive metabolic panel    Standing Status:   Future    Standing Expiration Date:   10/22/2018   All questions were answered. The patient knows to call the clinic with any problems, questions or concerns.      Cammie Sickle, MD 10/25/2017 8:36 AM

## 2017-11-11 ENCOUNTER — Inpatient Hospital Stay: Payer: Medicare Other

## 2017-11-11 ENCOUNTER — Inpatient Hospital Stay: Payer: Medicare Other | Attending: Internal Medicine

## 2017-11-11 ENCOUNTER — Encounter: Payer: Self-pay | Admitting: Internal Medicine

## 2017-11-11 ENCOUNTER — Other Ambulatory Visit: Payer: Self-pay

## 2017-11-11 ENCOUNTER — Inpatient Hospital Stay (HOSPITAL_BASED_OUTPATIENT_CLINIC_OR_DEPARTMENT_OTHER): Payer: Medicare Other | Admitting: Internal Medicine

## 2017-11-11 VITALS — BP 126/80 | HR 88 | Temp 97.9°F | Resp 20 | Ht 63.0 in | Wt 160.6 lb

## 2017-11-11 DIAGNOSIS — C50812 Malignant neoplasm of overlapping sites of left female breast: Secondary | ICD-10-CM

## 2017-11-11 DIAGNOSIS — F0390 Unspecified dementia without behavioral disturbance: Secondary | ICD-10-CM

## 2017-11-11 DIAGNOSIS — Z171 Estrogen receptor negative status [ER-]: Secondary | ICD-10-CM | POA: Insufficient documentation

## 2017-11-11 DIAGNOSIS — Z5112 Encounter for antineoplastic immunotherapy: Secondary | ICD-10-CM | POA: Insufficient documentation

## 2017-11-11 DIAGNOSIS — R270 Ataxia, unspecified: Secondary | ICD-10-CM | POA: Diagnosis not present

## 2017-11-11 DIAGNOSIS — M545 Low back pain: Secondary | ICD-10-CM

## 2017-11-11 LAB — CBC WITH DIFFERENTIAL/PLATELET
BASOS PCT: 1 %
Basophils Absolute: 0.1 10*3/uL (ref 0–0.1)
EOS ABS: 0.2 10*3/uL (ref 0–0.7)
EOS PCT: 4 %
HCT: 40.2 % (ref 35.0–47.0)
Hemoglobin: 13.6 g/dL (ref 12.0–16.0)
LYMPHS ABS: 1 10*3/uL (ref 1.0–3.6)
Lymphocytes Relative: 18 %
MCH: 29.6 pg (ref 26.0–34.0)
MCHC: 33.9 g/dL (ref 32.0–36.0)
MCV: 87.3 fL (ref 80.0–100.0)
Monocytes Absolute: 0.3 10*3/uL (ref 0.2–0.9)
Monocytes Relative: 5 %
Neutro Abs: 3.7 10*3/uL (ref 1.4–6.5)
Neutrophils Relative %: 72 %
PLATELETS: 365 10*3/uL (ref 150–440)
RBC: 4.6 MIL/uL (ref 3.80–5.20)
RDW: 15.2 % — ABNORMAL HIGH (ref 11.5–14.5)
WBC: 5.2 10*3/uL (ref 3.6–11.0)

## 2017-11-11 LAB — COMPREHENSIVE METABOLIC PANEL
ALK PHOS: 73 U/L (ref 38–126)
ALT: 17 U/L (ref 14–54)
AST: 31 U/L (ref 15–41)
Albumin: 4.1 g/dL (ref 3.5–5.0)
Anion gap: 9 (ref 5–15)
BILIRUBIN TOTAL: 0.4 mg/dL (ref 0.3–1.2)
BUN: 17 mg/dL (ref 6–20)
CALCIUM: 8.9 mg/dL (ref 8.9–10.3)
CHLORIDE: 102 mmol/L (ref 101–111)
CO2: 24 mmol/L (ref 22–32)
CREATININE: 0.79 mg/dL (ref 0.44–1.00)
GFR calc Af Amer: >60 mL/min (ref >60)
Glucose, Bld: 173 mg/dL — ABNORMAL HIGH (ref 65–99)
Potassium: 3.5 mmol/L (ref 3.5–5.1)
Sodium: 135 mmol/L (ref 135–145)
TOTAL PROTEIN: 6.9 g/dL (ref 6.5–8.1)

## 2017-11-11 MED ORDER — DIPHENHYDRAMINE HCL 50 MG/ML IJ SOLN
12.5000 mg | Freq: Once | INTRAMUSCULAR | Status: AC
  Administered 2017-11-11: 12.5 mg via INTRAVENOUS
  Filled 2017-11-11: qty 1

## 2017-11-11 MED ORDER — HEPARIN SOD (PORK) LOCK FLUSH 100 UNIT/ML IV SOLN
500.0000 [IU] | Freq: Once | INTRAVENOUS | Status: AC | PRN
  Administered 2017-11-11: 500 [IU]

## 2017-11-11 MED ORDER — ACETAMINOPHEN 325 MG PO TABS
650.0000 mg | ORAL_TABLET | Freq: Once | ORAL | Status: AC
  Administered 2017-11-11: 650 mg via ORAL
  Filled 2017-11-11: qty 2

## 2017-11-11 MED ORDER — SODIUM CHLORIDE 0.9 % IV SOLN
Freq: Once | INTRAVENOUS | Status: AC
  Administered 2017-11-11: 09:00:00 via INTRAVENOUS
  Filled 2017-11-11: qty 1000

## 2017-11-11 MED ORDER — SODIUM CHLORIDE 0.9 % IV SOLN
420.0000 mg | Freq: Once | INTRAVENOUS | Status: AC
  Administered 2017-11-11: 420 mg via INTRAVENOUS
  Filled 2017-11-11: qty 14

## 2017-11-11 MED ORDER — TRASTUZUMAB CHEMO 150 MG IV SOLR
450.0000 mg | Freq: Once | INTRAVENOUS | Status: AC
  Administered 2017-11-11: 450 mg via INTRAVENOUS
  Filled 2017-11-11: qty 21.43

## 2017-11-11 NOTE — Assessment & Plan Note (Addendum)
Problem is # Metastatic ER/[name] HER-2/neu positive-status post cycle #3- Taxol Herceptin perjeta; CT scan 4th Jan 2019 shows partial response. March 5th EF- 61%  # Proceed with cycle # 7 H-P only.  CBC CMP are reviewed; adequate today; no contraindications to chemotherapy. Proceed with treatment today. Will order scans today.   # Low back pain- MRI lumbar spine- arthritic/dege changes s/p eval with Dr.Miller appt. Heat pad/ice; appt in 1 week.   # Dementia-/chronic debility/ataxia- STABLE; on PT/OT- improving/ cane with walker. Marland Kitchen   #Follow-up in 3 weeks labs Herceptin perjeta; CT scans prior.

## 2017-11-11 NOTE — Progress Notes (Signed)
[place] OFFICE PROGRESS NOTE  Patient Care Team: [name], [name], MD (General Surgery) Requested, Self  Breast cancer metastasized to axillary lymph node [facility])   Staging form: Breast, [name], [name], [name], MD on 01/29/2015    Oncology History   # June 2016- LEFT BREAST CA;  [name] ; [name] ; [name]; Her 2 Neu POS; [place] from July OF 2016; [name]; adjuvant Herceptin [ Finished July 2017]; AUG 2017- Neratinib x5 days; DISCON sec to diarrhea  # MID OCT 2018- Right breast mass-Bx- ER/PR-NEG; Her 2 NEU POSITIVE 1~2.5cm;  [?NEW primary]  # MID-OCT 2018-METASTATIC RECURRENT-oh sternal mass; Left Ax LN [Bx]/periportal LN  # OCT 25th 2018- TAXOL-HERCEPTIN-PERJETA; Jan 2019- CT PR; continue HP only.    -------------------------------------------------------------------------------   # MUGA scan- July 28th 2017- 67%.  # chronic gait/balance issues  # June 2017- left breast Bx- fat necrosis [[name]  # july 2017-  BRCA 1& 2- NEG.   MOLECULAR TESTING- F ONE- TPS- 0%; pending; ERB2 amplification; PI3K/RET amplification Others**     Carcinoma of overlapping sites of left breast in female, estrogen receptor negative ([place])    INTERVAL HISTORY: Patient a poor historian given mild dementia.   [name] 77 y.o.  female pleasant patient above history of  recurrent/metastatic breast cancer ER/[name] HER-2/neu positive currently on Herceptin-perjeta maintenance is here for follow-up.  Patient continues to have intermittent low back pain however overall stable; not getting significantly worse.  Patient interim seems to be improving overall.  Denies any new falls or seizure-like activity. Denies any new lumps or bumps.  Appetite is good.   Lower extremity fracture-healing well.  REVIEW OF SYSTEMS:  A complete 10 point review of system is done which is negative except mentioned above/history of present illness.   PAST MEDICAL HISTORY :  Past Medical History:  Diagnosis Date  . Arthritis   . 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 Breast cancer ([place]) 2016   left breast; surgery 01/11/15 with Dr. Bary Castilla; path with invasive mammary and DCIS  . Breast cancer of upper-inner quadrant of left female breast ([place]) 12/15/14   Completed radiation end of December and finished chemotherapy 2 weeks ago, Left breast invasive mammary carcinoma, T1cN71mc (1.5 cm); Grade 3, [name], [name], [name], .  .Marland KitchenCataract    bilat   . DDD (degenerative disc disease), lumbar    Lumbar, previously evaluated by Dr. HEarnestine Leys . Dementia   . Fibrocystic breast disease    prior biopsy  . Glaucoma   . 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 Hearing loss   . History of cancer chemotherapy   . History of radiation therapy   . Hyperlipidemia, unspecified   . Imbalance   . Memory impairment    seen by Dr SManuella Ghazi possible post crainiotomy from radiation  . [place]    Left cavernous sinus meningioma, [facility]  . Numbness and tingling    right hand   . Osteoarthritis   . Osteoporosis, post-menopausal   . Shoulder pain, right   . Wears glasses  PAST SURGICAL HISTORY :   Past Surgical History:  Procedure Laterality Date  . APPENDECTOMY  1950  . BRAIN SURGERY  1995   left frontal/temporal  . BREAST BIOPSY Left 1997  . BREAST BIOPSY Left 12/15/14   confirmed DCIS  . BREAST BIOPSY Left 01/08/2015   Procedure: BREAST BIOPSY WITH NEEDLE LOCALIZATION;  Surgeon: [name], MD;  Location: [facility] ORS;  Service: General;  Laterality: Left;  . BREAST LUMPECTOMY Left 01/08/2015   Procedure: LUMPECTOMY;  Surgeon: [name], MD;   Location: [facility] ORS;  Service: General;  Laterality: Left;  . COLONOSCOPY  2010   Dr. [name]  . ORIF ANKLE FRACTURE Right 08/05/2017   Procedure: OPEN REDUCTION INTERNAL FIXATION (ORIF) ANKLE FRACTURE;  Surgeon: Earnestine Leys, MD;  Location: [facility] ORS;  Service: Orthopedics;  Laterality: Right;  . PORTACATH PLACEMENT Right 01/16/2015   Procedure: INSERTION PORT-A-CATH;  Surgeon: [name], MD;  Location: [facility] ORS;  Service: General;  Laterality: Right;  . SENTINEL NODE BIOPSY Left 01/16/2015   Procedure: SENTINEL NODE BIOPSY;  Surgeon: [name], MD;  Location: [facility] ORS;  Service: General;  Laterality: Left;  . TOTAL HIP ARTHROPLASTY Right 09/04/2015   Procedure: RIGHT TOTAL HIP ARTHROPLASTY ANTERIOR APPROACH;  Surgeon: [name], MD;  Location: [facility] ORS;  Service: Orthopedics;  Laterality: Right;    FAMILY HISTORY :   Family History  Problem Relation Age of Onset  . Breast cancer Sister 77  . Addison's disease Mother   . Hyperthyroidism Mother   . Osteoarthritis Mother   . Stroke Father   . Heart disease Father   . High blood pressure Father     SOCIAL HISTORY:   Social History   Tobacco Use  . Smoking status: Former Smoker    Packs/day: 0.25    Years: 5.00    Pack years: 1.25    Types: Cigarettes    Last attempt to quit: 07/28/1972    Years since quitting: 45.3  . Smokeless tobacco: Never Used  Substance Use Topics  . Alcohol use: Yes    Alcohol/week: 0.6 - 1.2 oz    Types: 1 - 2 Glasses of wine per week    Comment: 1 Glass Wine / Night  . Drug use: No    ALLERGIES:  is allergic to donepezil.  MEDICATIONS:  Current Outpatient Medications  Medication Sig Dispense Refill  . acetaminophen (TYLENOL) 325 MG tablet Take 650 mg by mouth every 4 (four) hours as needed. Pain / increased temp.    Marland Kitchen aspirin 325 MG EC tablet Take 325 mg by mouth 2 (two) times daily.     . bisacodyl (DULCOLAX) 10 MG suppository Place 1 suppository (10 mg total) rectally daily as  needed for moderate constipation. (Patient not taking: Reported on 10/01/2017) 12 suppository 0  . Calcium Carbonate-Vit D-Min (CALTRATE 600+D PLUS MINERALS) 600-800 MG-UNIT TABS Take 1 tablet by mouth every morning.     . celecoxib (CELEBREX) 200 MG capsule Take 200 mg by mouth 2 (two) times daily.    . Cholecalciferol (VITAMIN D-3) 5000 units TABS Take 1 tablet by mouth daily.    Marland Kitchen levETIRAcetam (KEPPRA) 250 MG tablet 1 tablet daily at bedtime  5  . loratadine (CLARITIN) 10 MG tablet Take 10 mg by mouth daily as needed for allergies.     Marland Kitchen prochlorperazine (COMPAZINE) 10 MG tablet Take 1 tablet (10 mg total) every 6 (six) hours as needed by mouth for nausea or vomiting. (Patient not taking:  Reported on 10/01/2017) 40 tablet 1  . senna (SENOKOT) 8.6 MG tablet Take 1 tablet by mouth 2 (two) times daily.     . traMADol (ULTRAM) 50 MG tablet Take 50 mg by mouth 2 (two) times daily.    . vitamin C (ASCORBIC ACID) 250 MG tablet Take 250 mg by mouth daily.    . vitamin E 400 UNIT capsule Take 400 Units by mouth daily.      No current facility-administered medications for this visit.     PHYSICAL EXAMINATION: ECOG PERFORMANCE STATUS: 1 - Symptomatic but completely ambulatory  BP 126/80   Pulse 88   Temp 97.9 F (36.6 C) (Oral)   Resp 20   Ht _0  (1.6 m)   Wt 160 lb 9.6 oz (72.8 kg)   BMI 28.45 kg/m   Filed Weights   11/11/17 0846  Weight: 160 lb 9.6 oz (72.8 kg)    GENERAL: Well-nourished well-developed; Alert, no distress and comfortable.  With her husband.  She is walking with a walker. EYES: no pallor or icterus OROPHARYNX: no thrush or ulceration; good dentition  NECK: supple, no masses felt LYMPH:  palpable lymphadenopathy Left supraclavicular region ; left cervical region. Adenopathy felt in the left axillary/improved. LUNGS: clear to auscultation and  No wheeze or crackles HEART/CVS: regular rate & rhythm and no murmurs; No lower extremity edema; right lower extremity Ace  wrap. ABDOMEN:abdomen soft, non-tender and normal bowel sounds Musculoskeletal:no cyanosis of digits and no clubbing;  PSYCH: alert & oriented x 3 with fluent speech NEURO: no focal motor/sensory deficits SKIN:  no rashes or significant lesions.   LABORATORY DATA:  I have reviewed the data as listed    Component Value Date/Time   NA 135 11/11/2017 0829   K 3.5 11/11/2017 0829   CL 102 11/11/2017 0829   CO2 24 11/11/2017 0829   GLUCOSE 173 (H) 11/11/2017 0829   BUN 17 11/11/2017 0829   CREATININE 0.79 11/11/2017 0829   CALCIUM 8.9 11/11/2017 0829   PROT 6.9 11/11/2017 0829   ALBUMIN 4.1 11/11/2017 0829   AST 31 11/11/2017 0829   ALT 17 11/11/2017 0829   ALKPHOS 73 11/11/2017 0829   BILITOT 0.4 11/11/2017 0829   GFRNONAA >60 11/11/2017 0829   GFRAA >60 11/11/2017 0829    No results found for: SPEP, UPEP  Lab Results  Component Value Date   WBC 5.2 11/11/2017   NEUTROABS 3.7 11/11/2017   HGB 13.6 11/11/2017   HCT 40.2 11/11/2017   MCV 87.3 11/11/2017   PLT 365 11/11/2017      Chemistry      Component Value Date/Time   NA 135 11/11/2017 0829   K 3.5 11/11/2017 0829   CL 102 11/11/2017 0829   CO2 24 11/11/2017 0829   BUN 17 11/11/2017 0829   CREATININE 0.79 11/11/2017 0829      Component Value Date/Time   CALCIUM 8.9 11/11/2017 0829   ALKPHOS 73 11/11/2017 0829   AST 31 11/11/2017 0829   ALT 17 11/11/2017 0829   BILITOT 0.4 11/11/2017 0829     No  focal wall motion abnormality of the left ventricle. Calculated left ventricular ejection fraction equals 67%. Previously 61% on 11/13/2015 IMPRESSION: Left ventricular ejection fraction equals 67 %. Electronically Signed   By: Suzy Bouchard M.D.   On: 02/20/2016 15:02 -----------------------------------------------     IMPRESSION: 1. Prior thoracic and abdominal adenopathy has essentially resolved. Notably reduced size of the left parasternal soft tissue mass extending into  the mediastinum. The  previously hypermetabolic right lower breast nodule has also essentially resolved. 2. Diffuse sclerosis throughout the sternal manubrium, likely representing response to therapy related to prior manubrial metastatic lesion. There is a suspected pathologic fracture along the inferior portion of the manubrium best seen on the parasagittal images. 3. 4 cm right adnexal cyst, not previously hypermetabolic. Cysts of this size in this age group typically warrant pelvic sonography for definitive characterization. 4. Other imaging findings of potential clinical significance: Pelvic floor laxity with small cystocele. Aortic Atherosclerosis (ICD10-I70.0). Calcified uterine fibroid.  RADIOGRAPHIC STUDIES: I have personally reviewed the radiological images as listed and agreed with the findings in the report. No results found.   ASSESSMENT & PLAN:  Carcinoma of overlapping sites of left breast in female, estrogen receptor negative ([place]) Problem is # Metastatic ER/[name] HER-2/neu positive-status post cycle #3- Taxol Herceptin perjeta; CT scan 4th Jan 2019 shows partial response. March 5th EF- 61%  # Proceed with cycle # 7 H-P only.  CBC CMP are reviewed; adequate today; no contraindications to chemotherapy. Proceed with treatment today. Will order scans today.   # Low back pain- MRI lumbar spine- arthritic/dege changes s/p eval with Dr.Miller appt. Heat pad/ice; appt in 1 week.   # Dementia-/chronic debility/ataxia- STABLE; on PT/OT- improving/ cane with walker. Marland Kitchen   #Follow-up in 3 weeks labs Herceptin perjeta; CT scans prior.   Orders Placed This Encounter  Procedures  . CT CHEST W CONTRAST    Standing Status:   Future    Standing Expiration Date:   11/12/2018    Order Specific Question:   If indicated for the ordered procedure, I authorize the administration of contrast media per Radiology protocol    Answer:   Yes    Order Specific Question:   Preferred imaging location?    Answer:    [facility]    Order Specific Question:   Radiology Contrast Protocol - do NOT remove file path    Answer:   \\[email or web address]  . CT Abdomen Pelvis W Contrast    Standing Status:   Future    Standing Expiration Date:   11/11/2018    Order Specific Question:   If indicated for the ordered procedure, I authorize the administration of contrast media per Radiology protocol    Answer:   Yes    Order Specific Question:   Preferred imaging location?    Answer:   [facility]    Order Specific Question:   Is Oral Contrast requested for this exam?    Answer:   Per Radiology protocol    Order Specific Question:   Radiology Contrast Protocol - do NOT remove file path    Answer:   \\[email or web address]  . CBC with Differential/Platelet    Standing Status:   Future    Standing Expiration Date:   11/12/2018  . Comprehensive metabolic panel    Standing Status:   Future    Standing Expiration Date:   11/12/2018   All questions were answered. The patient knows to call the clinic with any problems, questions or concerns.      Cammie Sickle, MD 11/12/2017 11:44 PM

## 2017-11-20 ENCOUNTER — Telehealth: Payer: Self-pay | Admitting: *Deleted

## 2017-11-20 NOTE — Telephone Encounter (Signed)
Pt's husband reports that pt is having significant pain in left breast-2 position.  Pt husband felt a little thickening. Area in L breast acutely tender to touch per husband. No sign changes per husband. This location is in a different area from the orginal operation site. Ct scan scheduled for abd and spin in may. Husband reports pt is very anxious about this new finding. I spoke with Dr. Rogue Bussing. Dr. B suggested pt make a sooner apt with Dr. Bary Castilla to have this left breast evaluated.

## 2017-11-20 NOTE — Telephone Encounter (Signed)
Dr Tobe Sos called and would like a return call regarding non urgent questions. Please return his call [phone]

## 2017-11-23 ENCOUNTER — Telehealth: Payer: Self-pay | Admitting: *Deleted

## 2017-11-23 NOTE — Telephone Encounter (Signed)
Patient called and wanted to see if we can order her a mammogram. She currently has an appointment in June to see [name] and the notes stated that Dr. Rogue Bussing to order mammogram but patients husband talked with their office and they told him that [name] needs to order that. Patient is having some pain in her breast and just wants to get a mammogram done.

## 2017-11-24 ENCOUNTER — Other Ambulatory Visit: Payer: Self-pay

## 2017-11-24 DIAGNOSIS — C50812 Malignant neoplasm of overlapping sites of left female breast: Secondary | ICD-10-CM

## 2017-11-24 DIAGNOSIS — Z171 Estrogen receptor negative status [ER-]: Principal | ICD-10-CM

## 2017-11-30 ENCOUNTER — Ambulatory Visit
Admission: RE | Admit: 2017-11-30 | Discharge: 2017-11-30 | Disposition: A | Discharge status: Home / Self Care | Payer: Medicare Other | Source: Ambulatory Visit | Attending: Internal Medicine | Admitting: Internal Medicine

## 2017-11-30 DIAGNOSIS — C50812 Malignant neoplasm of overlapping sites of left female breast: Secondary | ICD-10-CM | POA: Insufficient documentation

## 2017-11-30 DIAGNOSIS — N83201 Unspecified ovarian cyst, right side: Secondary | ICD-10-CM | POA: Diagnosis not present

## 2017-11-30 DIAGNOSIS — Z171 Estrogen receptor negative status [ER-]: Secondary | ICD-10-CM | POA: Insufficient documentation

## 2017-11-30 MED ORDER — IOPAMIDOL (ISOVUE-300) INJECTION 61%
100.0000 mL | Freq: Once | INTRAVENOUS | Status: AC | PRN
  Administered 2017-11-30: 100 mL via INTRAVENOUS

## 2017-12-02 ENCOUNTER — Inpatient Hospital Stay (HOSPITAL_BASED_OUTPATIENT_CLINIC_OR_DEPARTMENT_OTHER): Payer: Medicare Other | Admitting: Internal Medicine

## 2017-12-02 ENCOUNTER — Inpatient Hospital Stay: Payer: Medicare Other | Attending: Internal Medicine

## 2017-12-02 ENCOUNTER — Inpatient Hospital Stay: Payer: Medicare Other

## 2017-12-02 ENCOUNTER — Encounter: Payer: Self-pay | Admitting: Internal Medicine

## 2017-12-02 ENCOUNTER — Other Ambulatory Visit: Payer: Self-pay

## 2017-12-02 VITALS — BP 105/72 | HR 83 | Temp 97.9°F | Resp 20 | Ht 63.0 in | Wt 159.6 lb

## 2017-12-02 DIAGNOSIS — Z171 Estrogen receptor negative status [ER-]: Secondary | ICD-10-CM | POA: Insufficient documentation

## 2017-12-02 DIAGNOSIS — Z5112 Encounter for antineoplastic immunotherapy: Secondary | ICD-10-CM | POA: Insufficient documentation

## 2017-12-02 DIAGNOSIS — C50812 Malignant neoplasm of overlapping sites of left female breast: Secondary | ICD-10-CM | POA: Diagnosis present

## 2017-12-02 LAB — COMPREHENSIVE METABOLIC PANEL
ALK PHOS: 77 U/L (ref 38–126)
ALT: 17 U/L (ref 14–54)
AST: 34 U/L (ref 15–41)
Albumin: 4.1 g/dL (ref 3.5–5.0)
Anion gap: 11 (ref 5–15)
BUN: 15 mg/dL (ref 6–20)
CALCIUM: 9 mg/dL (ref 8.9–10.3)
CO2: 23 mmol/L (ref 22–32)
CREATININE: 0.83 mg/dL (ref 0.44–1.00)
Chloride: 103 mmol/L (ref 101–111)
Glucose, Bld: 155 mg/dL — ABNORMAL HIGH (ref 65–99)
Potassium: 3.9 mmol/L (ref 3.5–5.1)
Sodium: 137 mmol/L (ref 135–145)
Total Bilirubin: 0.6 mg/dL (ref 0.3–1.2)
Total Protein: 6.8 g/dL (ref 6.5–8.1)

## 2017-12-02 LAB — CBC WITH DIFFERENTIAL/PLATELET
BASOS PCT: 1 %
Basophils Absolute: 0 10*3/uL (ref 0–0.1)
EOS ABS: 0.2 10*3/uL (ref 0–0.7)
EOS PCT: 4 %
HCT: 40.6 % (ref 35.0–47.0)
Hemoglobin: 13.8 g/dL (ref 12.0–16.0)
Lymphocytes Relative: 19 %
Lymphs Abs: 0.9 10*3/uL — ABNORMAL LOW (ref 1.0–3.6)
MCH: 29.4 pg (ref 26.0–34.0)
MCHC: 33.9 g/dL (ref 32.0–36.0)
MCV: 86.6 fL (ref 80.0–100.0)
MONOS PCT: 6 %
Monocytes Absolute: 0.3 10*3/uL (ref 0.2–0.9)
NEUTROS PCT: 70 %
Neutro Abs: 3.5 10*3/uL (ref 1.4–6.5)
PLATELETS: 327 10*3/uL (ref 150–440)
RBC: 4.68 MIL/uL (ref 3.80–5.20)
RDW: 15.6 % — ABNORMAL HIGH (ref 11.5–14.5)
WBC: 4.9 10*3/uL (ref 3.6–11.0)

## 2017-12-02 MED ORDER — SODIUM CHLORIDE 0.9% FLUSH
10.0000 mL | INTRAVENOUS | Status: DC | PRN
  Administered 2017-12-02: 10 mL via INTRAVENOUS
  Filled 2017-12-02: qty 10

## 2017-12-02 MED ORDER — DIPHENHYDRAMINE HCL 50 MG/ML IJ SOLN
12.5000 mg | Freq: Once | INTRAMUSCULAR | Status: AC
  Administered 2017-12-02: 12.5 mg via INTRAVENOUS
  Filled 2017-12-02: qty 1

## 2017-12-02 MED ORDER — SODIUM CHLORIDE 0.9 % IV SOLN
Freq: Once | INTRAVENOUS | Status: AC
  Administered 2017-12-02: 09:00:00 via INTRAVENOUS
  Filled 2017-12-02: qty 1000

## 2017-12-02 MED ORDER — PERTUZUMAB CHEMO INJECTION 420 MG/14ML
420.0000 mg | Freq: Once | INTRAVENOUS | Status: AC
  Administered 2017-12-02: 420 mg via INTRAVENOUS
  Filled 2017-12-02: qty 14

## 2017-12-02 MED ORDER — ACETAMINOPHEN 325 MG PO TABS
650.0000 mg | ORAL_TABLET | Freq: Once | ORAL | Status: AC
  Administered 2017-12-02: 650 mg via ORAL
  Filled 2017-12-02: qty 2

## 2017-12-02 MED ORDER — HEPARIN SOD (PORK) LOCK FLUSH 100 UNIT/ML IV SOLN
500.0000 [IU] | Freq: Once | INTRAVENOUS | Status: AC
  Administered 2017-12-02: 500 [IU] via INTRAVENOUS
  Filled 2017-12-02: qty 5

## 2017-12-02 MED ORDER — TRASTUZUMAB CHEMO 150 MG IV SOLR
450.0000 mg | Freq: Once | INTRAVENOUS | Status: AC
  Administered 2017-12-02: 450 mg via INTRAVENOUS
  Filled 2017-12-02: qty 21.43

## 2017-12-02 NOTE — Assessment & Plan Note (Addendum)
#  Metastatic ER/[name] HER-2/neu positive: Currently on  Herceptin perjeta; Nov 30, 2017 CT scan chest/abdomen-negative for any active metastatic disease.  Reviewed the findings with the patient and family.  # Continue Herceptin perjeta. CBC CMP are reviewed; adequate today; no contraindications to chemotherapy. Proceed with treatment today.   #  low back painlikely arthritis/degeneration-not resolved awaiting repeat  with Dr. Sabra Heck. Appointment  #  Dementia/chronic debility/ataxia-currently stable;  currently walking with a cane/walker.  # Dementia-/chronic debility/ataxia- STABLE; on PT/OT- improving/ cane with walker. Marland Kitchen   # 3 weeks/labs/herceptin-perjeta/MD.

## 2017-12-02 NOTE — Progress Notes (Signed)
[facility] OFFICE PROGRESS NOTE  Patient Care Team: Klein, Bert J III, MD as PCP - General (Internal Medicine) Byrnett, Jeffrey W, MD (General Surgery) Requested, [name]   Oncology History   # June 2016- LEFT BREAST CA;  [name] ; [name] ; [name]; Her 2 Neu POS; TCH from July OF 2016; [name]; adjuvant Herceptin [ Finished July 2017]; AUG 2017- Neratinib x5 days; DISCON sec to diarrhea  # MID OCT 2018- Right breast mass-Bx- ER/PR-NEG; Her 2 NEU POSITIVE 1~2.5cm;  [?NEW primary]  # MID-OCT 2018-METASTATIC RECURRENT-oh sternal mass; Left Ax LN [Bx]/periportal LN  # OCT 25th 2018- TAXOL-HERCEPTIN-PERJETA; Jan 2019- CT PR; continue HP only.    -------------------------------------------------------------------------------   # MUGA scan- July 28th 2017- 67%.  # chronic gait/balance issues  # June 2017- left breast Bx- fat necrosis [[name]  # july 2017-  BRCA 1& 2- NEG.   MOLECULAR TESTING- F ONE- TPS- 0%; pending; ERB2 amplification; PI3K/RET amplification Others**     Carcinoma of overlapping sites of left breast in female, estrogen receptor negative (HCC)      INTERVAL HISTORY:  [name] 76 y.o.  female pleasant patient above history of negative HER-2/neu positive metastatic breast cancer currently on Herceptin perjeta is here for follow-up.  Patient continues to have chronic low back pain radiating to the left.  Patient has been using heating pad with mild improvement; not resolved.  Continues to complain of chronic left wrist pain without any obvious masses.  Appetite is good.  No diarrhea.  Review of Systems  Constitutional: Negative for chills, diaphoresis, fever, malaise/fatigue and weight loss.  HENT: Negative for nosebleeds and sore throat.   Eyes: Negative for double vision.  Respiratory: Negative for cough,  hemoptysis, sputum production, shortness of breath and wheezing.   Cardiovascular: Negative for chest pain, palpitations, orthopnea and leg swelling.  Gastrointestinal: Negative for abdominal pain, blood in stool, constipation, diarrhea, heartburn, melena, nausea and vomiting.  Genitourinary: Negative for dysuria, frequency and urgency.  Musculoskeletal: Positive for back pain and joint pain.  Skin: Negative.  Negative for itching and rash.  Neurological: Positive for weakness. Negative for dizziness, tingling, focal weakness and headaches.  Endo/Heme/Allergies: Does not bruise/bleed easily.  Psychiatric/Behavioral: Positive for memory loss. Negative for depression. The patient is not nervous/anxious and does not have insomnia.       PAST MEDICAL HISTORY :  Past Medical History:  Diagnosis Date  . Arthritis   . 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 Breast cancer (HCC) 2016   left breast; surgery 01/11/15 with Dr. Byrnett; path with invasive mammary and DCIS  . Breast cancer of upper-inner quadrant of left female breast (HCC) 12/15/14   Completed radiation end of December and finished chemotherapy 2 weeks ago, Left breast invasive mammary carcinoma, T1cN1mic (1.5 cm); Grade 3, [name], [name], [name], .  . Cataract    bilat   . DDD (degenerative disc disease), lumbar    Lumbar, previously evaluated by Dr. Howard Miller  . Dementia   . Fibrocystic breast disease    prior biopsy  . Glaucoma   . 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 Hearing loss   . History of cancer chemotherapy   . History of radiation therapy   . Hyperlipidemia, unspecified   .   Imbalance   . Memory impairment    seen by Dr Manuella Ghazi  possible post crainiotomy from radiation  . 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  . Numbness and tingling    right hand   . Osteoarthritis   . Osteoporosis,  post-menopausal   . Personal history of chemotherapy   . Personal history of radiation therapy   . Shoulder pain, right   . Wears glasses     PAST SURGICAL HISTORY :   Past Surgical History:  Procedure Laterality Date  . APPENDECTOMY  1950  . BRAIN SURGERY  1995   left frontal/temporal  . BREAST BIOPSY Left 1997  . BREAST BIOPSY Left 12/15/14   confirmed DCIS  . BREAST BIOPSY Left 01/08/2015   Procedure: BREAST BIOPSY WITH NEEDLE LOCALIZATION;  Surgeon: [name], MD;  Location: [facility] ORS;  Service: General;  Laterality: Left;  . BREAST LUMPECTOMY Left 01/08/2015   Procedure: LUMPECTOMY;  Surgeon: [name], MD;  Location: [facility] ORS;  Service: General;  Laterality: Left;  . COLONOSCOPY  2010   Dr. [name]  . ORIF ANKLE FRACTURE Right 08/05/2017   Procedure: OPEN REDUCTION INTERNAL FIXATION (ORIF) ANKLE FRACTURE;  Surgeon: Earnestine Leys, MD;  Location: [facility] ORS;  Service: Orthopedics;  Laterality: Right;  . PORTACATH PLACEMENT Right 01/16/2015   Procedure: INSERTION PORT-A-CATH;  Surgeon: [name], MD;  Location: [facility] ORS;  Service: General;  Laterality: Right;  . SENTINEL NODE BIOPSY Left 01/16/2015   Procedure: SENTINEL NODE BIOPSY;  Surgeon: [name], MD;  Location: [facility];  Service: General;  Laterality: Left;  . TOTAL HIP ARTHROPLASTY Right 09/04/2015   Procedure: RIGHT TOTAL HIP ARTHROPLASTY ANTERIOR APPROACH;  Surgeon: [name], MD;  Location: [facility];  Service: Orthopedics;  Laterality: Right;    FAMILY HISTORY :   Family History  Problem Relation Age of Onset  . Breast cancer Sister 75  . Addison's disease Mother   . Hyperthyroidism Mother   . Osteoarthritis Mother   . Stroke Father   . Heart disease Father   . High blood pressure Father     SOCIAL HISTORY:   Social History   Tobacco Use  . Smoking status: Former Smoker    Packs/day: 0.25    Years: 5.00    Pack years: 1.25    Types: Cigarettes    Last attempt to quit: 07/28/1972     Years since quitting: 45.3  . Smokeless tobacco: Never Used  Substance Use Topics  . Alcohol use: Yes    Alcohol/week: 0.6 - 1.2 oz    Types: 1 - 2 Glasses of wine per week    Comment: 1 Glass Wine / Night  . Drug use: No    ALLERGIES:  is allergic to donepezil.  MEDICATIONS:  Current Outpatient Medications  Medication Sig Dispense Refill  . acetaminophen (TYLENOL) 325 MG tablet Take 650 mg by mouth every 4 (four) hours as needed. Pain / increased temp.    Marland Kitchen aspirin 325 MG EC tablet Take 325 mg by mouth 2 (two) times daily.     . Calcium Carbonate-Vit D-Min (CALTRATE 600+D PLUS MINERALS) 600-800 MG-UNIT TABS Take 1 tablet by mouth every morning.     . Cholecalciferol (VITAMIN D-3) 5000 units TABS Take 1 tablet by mouth daily.    Marland Kitchen levETIRAcetam (KEPPRA) 250 MG tablet 1 tablet daily at bedtime  5  . loratadine (CLARITIN) 10 MG tablet  Take 10 mg by mouth daily as needed for allergies.     Marland Kitchen traMADol (ULTRAM) 50 MG tablet Take 50 mg by mouth 2 (two) times daily as needed for moderate pain.     . vitamin C (ASCORBIC ACID) 250 MG tablet Take 250 mg by mouth daily.    . vitamin E 400 UNIT capsule Take 400 Units by mouth daily.     . bisacodyl (DULCOLAX) 10 MG suppository Place 1 suppository (10 mg total) rectally daily as needed for moderate constipation. (Patient not taking: Reported on 10/01/2017) 12 suppository 0  . celecoxib (CELEBREX) 200 MG capsule Take 200 mg by mouth 2 (two) times daily.    . prochlorperazine (COMPAZINE) 10 MG tablet Take 1 tablet (10 mg total) every 6 (six) hours as needed by mouth for nausea or vomiting. (Patient not taking: Reported on 10/01/2017) 40 tablet 1   No current facility-administered medications for this visit.     PHYSICAL EXAMINATION: ECOG PERFORMANCE STATUS: 2 - Symptomatic, <50% confined to bed  BP 105/72   Pulse 83   Temp 97.9 F (36.6 C) (Tympanic)   Resp 20   Ht 5' 3" (1.6 m)   Wt 159 lb 9.6 oz (72.4 kg)   BMI 28.27 kg/m   Filed  Weights   12/02/17 0843  Weight: 159 lb 9.6 oz (72.4 kg)    GENERAL: Well-nourished well-developed; Alert, no distress and comfortable.  Accompanied by family.  She is in a wheelchair EYES: no pallor or icterus OROPHARYNX: no thrush or ulceration; NECK: supple; no lymph nodes felt. LYMPH:  no palpable lymphadenopathy in the axillary or inguinal regions LUNGS: Decreased breath sounds auscultation bilaterally. No wheeze or crackles HEART/CVS: regular rate & rhythm and no murmurs; No lower extremity edema ABDOMEN:abdomen soft, non-tender and normal bowel sounds. No hepatomegaly or splenomegaly.  Musculoskeletal:no cyanosis of digits and no clubbing  PSYCH: alert & oriented x 3 with fluent speech NEURO: no focal motor/sensory deficits SKIN:  no rashes or significant lesions    LABORATORY DATA:  I have reviewed the data as listed    Component Value Date/Time   NA 137 12/02/2017 0820   K 3.9 12/02/2017 0820   CL 103 12/02/2017 0820   CO2 23 12/02/2017 0820   GLUCOSE 155 (H) 12/02/2017 0820   BUN 15 12/02/2017 0820   CREATININE 0.83 12/02/2017 0820   CALCIUM 9.0 12/02/2017 0820   PROT 6.8 12/02/2017 0820   ALBUMIN 4.1 12/02/2017 0820   AST 34 12/02/2017 0820   ALT 17 12/02/2017 0820   ALKPHOS 77 12/02/2017 0820   BILITOT 0.6 12/02/2017 0820   GFRNONAA >60 12/02/2017 0820   GFRAA >60 12/02/2017 0820    No results found for: SPEP, UPEP  Lab Results  Component Value Date   WBC 4.9 12/02/2017   NEUTROABS 3.5 12/02/2017   HGB 13.8 12/02/2017   HCT 40.6 12/02/2017   MCV 86.6 12/02/2017   PLT 327 12/02/2017      Chemistry      Component Value Date/Time   NA 137 12/02/2017 0820   K 3.9 12/02/2017 0820   CL 103 12/02/2017 0820   CO2 23 12/02/2017 0820   BUN 15 12/02/2017 0820   CREATININE 0.83 12/02/2017 0820      Component Value Date/Time   CALCIUM 9.0 12/02/2017 0820   ALKPHOS 77 12/02/2017 0820   AST 34 12/02/2017 0820   ALT 17 12/02/2017 0820   BILITOT 0.6  12/02/2017 0820  RADIOGRAPHIC STUDIES: I have personally reviewed the radiological images as listed and agreed with the findings in the report. No results found.   ASSESSMENT & PLAN:  Carcinoma of overlapping sites of left breast in female, estrogen receptor negative (HCC)  # Metastatic ER/[name] HER-2/neu positive: Currently on  Herceptin perjeta; Nov 30, 2017 CT scan chest/abdomen-negative for any active metastatic disease.  Reviewed the findings with the patient and family.  # Continue Herceptin perjeta. CBC CMP are reviewed; adequate today; no contraindications to chemotherapy. Proceed with treatment today.   #  low back painlikely arthritis/degeneration-not resolved awaiting repeat  with Dr. Miller. Appointment  #  Dementia/chronic debility/ataxia-currently stable;  currently walking with a cane/walker.  # Dementia-/chronic debility/ataxia- STABLE; on PT/OT- improving/ cane with walker. .   # 3 weeks/labs/herceptin-perjeta/MD.    Orders Placed This Encounter  Procedures  . CBC with Differential/Platelet    Standing Status:   Future    Standing Expiration Date:   12/03/2018  . Comprehensive metabolic panel    Standing Status:   Future    Standing Expiration Date:   12/03/2018   All questions were answered. The patient knows to call the clinic with any problems, questions or concerns.       R , MD 12/06/2017 5:49 PM  

## 2017-12-03 ENCOUNTER — Ambulatory Visit
Admission: RE | Admit: 2017-12-03 | Discharge: 2017-12-03 | Disposition: A | Discharge status: Home / Self Care | Payer: Medicare Other | Source: Ambulatory Visit | Attending: General Surgery | Admitting: General Surgery

## 2017-12-03 DIAGNOSIS — Z171 Estrogen receptor negative status [ER-]: Secondary | ICD-10-CM | POA: Diagnosis present

## 2017-12-03 DIAGNOSIS — C50812 Malignant neoplasm of overlapping sites of left female breast: Secondary | ICD-10-CM | POA: Insufficient documentation

## 2017-12-03 HISTORY — DX: Personal history of antineoplastic chemotherapy: Z92.21

## 2017-12-03 HISTORY — DX: Personal history of irradiation: Z92.3

## 2017-12-23 ENCOUNTER — Inpatient Hospital Stay (HOSPITAL_BASED_OUTPATIENT_CLINIC_OR_DEPARTMENT_OTHER): Payer: Medicare Other | Admitting: Internal Medicine

## 2017-12-23 ENCOUNTER — Encounter: Payer: Self-pay | Admitting: Internal Medicine

## 2017-12-23 ENCOUNTER — Inpatient Hospital Stay: Payer: Medicare Other

## 2017-12-23 VITALS — BP 113/80 | HR 90 | Temp 97.8°F | Resp 16

## 2017-12-23 DIAGNOSIS — C50812 Malignant neoplasm of overlapping sites of left female breast: Secondary | ICD-10-CM

## 2017-12-23 DIAGNOSIS — Z171 Estrogen receptor negative status [ER-]: Principal | ICD-10-CM

## 2017-12-23 DIAGNOSIS — Z5112 Encounter for antineoplastic immunotherapy: Secondary | ICD-10-CM | POA: Diagnosis not present

## 2017-12-23 LAB — COMPREHENSIVE METABOLIC PANEL
ALK PHOS: 69 U/L (ref 38–126)
ALT: 18 U/L (ref 14–54)
ANION GAP: 9 (ref 5–15)
AST: 26 U/L (ref 15–41)
Albumin: 4.2 g/dL (ref 3.5–5.0)
BILIRUBIN TOTAL: 0.6 mg/dL (ref 0.3–1.2)
BUN: 13 mg/dL (ref 6–20)
CO2: 25 mmol/L (ref 22–32)
Calcium: 8.9 mg/dL (ref 8.9–10.3)
Chloride: 103 mmol/L (ref 101–111)
Creatinine, Ser: 0.75 mg/dL (ref 0.44–1.00)
GFR calc non Af Amer: >60 mL/min (ref >60)
Glucose, Bld: 135 mg/dL — ABNORMAL HIGH (ref 65–99)
Potassium: 3.9 mmol/L (ref 3.5–5.1)
SODIUM: 137 mmol/L (ref 135–145)
TOTAL PROTEIN: 6.8 g/dL (ref 6.5–8.1)

## 2017-12-23 LAB — CBC WITH DIFFERENTIAL/PLATELET
BASOS ABS: 0 10*3/uL (ref 0–0.1)
BASOS PCT: 1 %
Eosinophils Absolute: 0.2 10*3/uL (ref 0–0.7)
Eosinophils Relative: 3 %
HEMATOCRIT: 39.2 % (ref 35.0–47.0)
HEMOGLOBIN: 13.3 g/dL (ref 12.0–16.0)
Lymphocytes Relative: 15 %
Lymphs Abs: 0.8 10*3/uL — ABNORMAL LOW (ref 1.0–3.6)
MCH: 29.3 pg (ref 26.0–34.0)
MCHC: 33.9 g/dL (ref 32.0–36.0)
MCV: 86.4 fL (ref 80.0–100.0)
Monocytes Absolute: 0.4 10*3/uL (ref 0.2–0.9)
Monocytes Relative: 8 %
NEUTROS ABS: 3.9 10*3/uL (ref 1.4–6.5)
NEUTROS PCT: 73 %
Platelets: 321 10*3/uL (ref 150–440)
RBC: 4.54 MIL/uL (ref 3.80–5.20)
RDW: 15.9 % — ABNORMAL HIGH (ref 11.5–14.5)
WBC: 5.4 10*3/uL (ref 3.6–11.0)

## 2017-12-23 MED ORDER — DIPHENHYDRAMINE HCL 50 MG/ML IJ SOLN
12.5000 mg | Freq: Once | INTRAMUSCULAR | Status: AC
  Administered 2017-12-23: 12.5 mg via INTRAVENOUS
  Filled 2017-12-23: qty 1

## 2017-12-23 MED ORDER — ACETAMINOPHEN 325 MG PO TABS
650.0000 mg | ORAL_TABLET | Freq: Once | ORAL | Status: AC
  Administered 2017-12-23: 650 mg via ORAL
  Filled 2017-12-23: qty 2

## 2017-12-23 MED ORDER — SODIUM CHLORIDE 0.9 % IV SOLN
420.0000 mg | Freq: Once | INTRAVENOUS | Status: AC
  Administered 2017-12-23: 420 mg via INTRAVENOUS
  Filled 2017-12-23: qty 14

## 2017-12-23 MED ORDER — SODIUM CHLORIDE 0.9 % IV SOLN
Freq: Once | INTRAVENOUS | Status: AC
  Administered 2017-12-23: 11:00:00 via INTRAVENOUS
  Filled 2017-12-23: qty 1000

## 2017-12-23 MED ORDER — HEPARIN SOD (PORK) LOCK FLUSH 100 UNIT/ML IV SOLN
500.0000 [IU] | Freq: Once | INTRAVENOUS | Status: AC | PRN
  Administered 2017-12-23: 500 [IU]
  Filled 2017-12-23: qty 5

## 2017-12-23 MED ORDER — TRASTUZUMAB CHEMO 150 MG IV SOLR
450.0000 mg | Freq: Once | INTRAVENOUS | Status: AC
  Administered 2017-12-23: 450 mg via INTRAVENOUS
  Filled 2017-12-23: qty 21.43

## 2017-12-23 NOTE — Assessment & Plan Note (Addendum)
#  Metastatic breast cancer ER [name] HER-2/neu positive-May 2019 CT scan NED; improved.  Currently on Herceptin perjeta maintenance.  #Continue Herceptin perjeta today; Labs today reviewed;  acceptable for treatment today.   #Chronic dementia/debility-stable  #Diarrhea-2-3 loose stools over the last 2 days.  Current resolved.  Unlikely from current therapy.  Recommend Imodium.  # follow up in 4 weeks [vacation]/ labs/ cbc/cmp-HP

## 2017-12-23 NOTE — Progress Notes (Signed)
[facility] OFFICE PROGRESS NOTE  Patient Care Team: [name], [name], MD (General Surgery) Requested, [name]   Oncology History   # June 2016- LEFT BREAST CA;  [name] ; [name] ; [name]; Her 2 Neu POS; [place] from July OF 2016; [name]; adjuvant Herceptin [ Finished July 2017]; AUG 2017- Neratinib x5 days; DISCON sec to diarrhea  # MID OCT 2018- Right breast mass-Bx- ER/PR-NEG; Her 2 NEU POSITIVE 1~2.5cm;  [?NEW primary]  # MID-OCT 2018-METASTATIC RECURRENT-oh sternal mass; Left Ax LN [Bx]/periportal LN  # OCT 25th 2018- TAXOL-HERCEPTIN-PERJETA; Jan 2019- CT PR; continue HP only.    -------------------------------------------------------------------------------   # MUGA scan- July 28th 2017- 67%.  # chronic gait/balance issues  # June 2017- left breast Bx- fat necrosis [[name]  # july 2017-  BRCA 1& 2- NEG.   MOLECULAR TESTING- F ONE- TPS- 0%; pending; ERB2 amplification; PI3K/RET amplification Others**  --------------------------------------------------    DIAGNOSIS: [ OCT 2018]- REC/MET- BREAST CA ER/PR-NEG; her 2 POS  STAGE: 4       ;GOALS: Palliative  CURRENT/MOST RECENT THERAPY- Herceptin-Perjeta      Carcinoma of overlapping sites of left breast in female, estrogen receptor negative ([place])      INTERVAL HISTORY:  ILEAN SPRADLIN 77 y.o.  female pleasant patient above history of metastatic ER [name] HER-2/neu positive breast cancer here for follow-up/ proceed with Herceptin perjeta  Patient denies any unusual chest pain or shortness of breath cough.  She continues to have chronic mild fatigue.  Continues to hip pain.  She is currently on meloxicam.  Patient had episodes of diarrhea 2-3 loose stools a day over the last 2 days.  None currently.  Resolved.   Patient did not have been taking Imodium.  Review of Systems  Constitutional: Positive for malaise/fatigue. Negative for chills, diaphoresis, fever and weight loss.  HENT: Negative for nosebleeds and sore throat.   Eyes: Negative for double vision.  Respiratory: Negative for cough, hemoptysis, sputum production, shortness of breath and wheezing.   Cardiovascular: Negative for chest pain, palpitations, orthopnea and leg swelling.  Gastrointestinal: Positive for diarrhea. Negative for abdominal pain, blood in stool, constipation, heartburn, melena, nausea and vomiting.  Genitourinary: Negative for dysuria, frequency and urgency.  Musculoskeletal: Positive for joint pain. Negative for back pain.  Skin: Negative.  Negative for itching and rash.  Neurological: Negative for dizziness, tingling, focal weakness, weakness and headaches.  Endo/Heme/Allergies: Does not bruise/bleed easily.  Psychiatric/Behavioral: Positive for memory loss. Negative for depression. The patient is not nervous/anxious and does not have insomnia.       PAST MEDICAL HISTORY :  Past Medical History:  Diagnosis Date  . Arthritis   . Brain tumor ([place]) 1995   meningeoma  . Breast cancer ([place]) 2016   left breast; surgery 01/11/15 with Dr. Bary Castilla; path with invasive mammary and DCIS  . Breast cancer of upper-inner quadrant of left female breast ([place]) 12/15/14   Completed radiation end of December and finished chemotherapy 2 weeks ago, Left breast invasive mammary carcinoma, T1cN32mc (1.5 cm); Grade 3, [name], [name], [name], .  .Marland KitchenCataract    bilat   . DDD (degenerative disc disease), lumbar    Lumbar, previously evaluated by Dr. HEarnestine Leys . Dementia   .  Fibrocystic breast disease    prior biopsy  . Glaucoma   . 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 Hearing loss   . History of cancer chemotherapy   . History of radiation therapy   . Hyperlipidemia, unspecified    . Imbalance   . Memory impairment    seen by Dr Manuella Ghazi  possible post crainiotomy from radiation  . Meningioma ([place])    Left cavernous sinus meningioma, treated with resection and radiation therapy at [facility], 1995.  . Numbness and tingling    right hand   . Osteoarthritis   . Osteoporosis, post-menopausal   . Personal history of chemotherapy   . Personal history of radiation therapy   . Shoulder pain, right   . Wears glasses     PAST SURGICAL HISTORY :   Past Surgical History:  Procedure Laterality Date  . APPENDECTOMY  1950  . BRAIN SURGERY  1995   left frontal/temporal  . BREAST BIOPSY Left 1997  . BREAST BIOPSY Left 12/15/14   confirmed DCIS  . BREAST BIOPSY Left 01/08/2015   Procedure: BREAST BIOPSY WITH NEEDLE LOCALIZATION;  Surgeon: [name], MD;  Location: [facility];  Service: General;  Laterality: Left;  . BREAST LUMPECTOMY Left 01/08/2015   Procedure: LUMPECTOMY;  Surgeon: [name], MD;  Location: [facility];  Service: General;  Laterality: Left;  . COLONOSCOPY  2010   Dr. [name]  . ORIF ANKLE FRACTURE Right 08/05/2017   Procedure: OPEN REDUCTION INTERNAL FIXATION (ORIF) ANKLE FRACTURE;  Surgeon: Earnestine Leys, MD;  Location: [facility];  Service: Orthopedics;  Laterality: Right;  . PORTACATH PLACEMENT Right 01/16/2015   Procedure: INSERTION PORT-A-CATH;  Surgeon: [name], MD;  Location: [facility];  Service: General;  Laterality: Right;  . SENTINEL NODE BIOPSY Left 01/16/2015   Procedure: SENTINEL NODE BIOPSY;  Surgeon: [name], MD;  Location: [facility];  Service: General;  Laterality: Left;  . TOTAL HIP ARTHROPLASTY Right 09/04/2015   Procedure: RIGHT TOTAL HIP ARTHROPLASTY ANTERIOR APPROACH;  Surgeon: [name], MD;  Location: [facility];  Service: Orthopedics;  Laterality: Right;    FAMILY HISTORY :   Family History  Problem Relation Age of Onset  . Breast cancer Sister 41  . Addison's disease Mother   . Hyperthyroidism Mother   .  Osteoarthritis Mother   . Stroke Father   . Heart disease Father   . High blood pressure Father     SOCIAL HISTORY:   Social History   Tobacco Use  . Smoking status: Former Smoker    Packs/day: 0.25    Years: 5.00    Pack years: 1.25    Types: Cigarettes    Last attempt to quit: 07/28/1972    Years since quitting: 45.4  . Smokeless tobacco: Never Used  Substance Use Topics  . Alcohol use: Yes    Alcohol/week: 0.6 - 1.2 oz    Types: 1 - 2 Glasses of wine per week    Comment: 1 Glass Wine / Night  . Drug use: No    ALLERGIES:  is allergic to donepezil.  MEDICATIONS:  Current Outpatient Medications  Medication Sig Dispense Refill  . acetaminophen (TYLENOL) 325 MG tablet Take 650 mg by mouth every 4 (four) hours as needed. Pain / increased temp.    Marland Kitchen aspirin 325 MG EC tablet Take 325 mg by mouth 2 (two) times daily.     . Calcium Carbonate-Vit D-Min (CALTRATE 600+D PLUS  MINERALS) 600-800 MG-UNIT TABS Take 1 tablet by mouth every morning.     . celecoxib (CELEBREX) 200 MG capsule Take 200 mg by mouth 2 (two) times daily.    . Cholecalciferol (VITAMIN D-3) 5000 units TABS Take 1 tablet by mouth daily.    Marland Kitchen levETIRAcetam (KEPPRA) 250 MG tablet 1 tablet daily at bedtime  5  . loratadine (CLARITIN) 10 MG tablet Take 10 mg by mouth daily as needed for allergies.     Marland Kitchen traMADol (ULTRAM) 50 MG tablet Take 50 mg by mouth 2 (two) times daily as needed for moderate pain.     . vitamin C (ASCORBIC ACID) 250 MG tablet Take 250 mg by mouth daily.    . vitamin E 400 UNIT capsule Take 400 Units by mouth daily.     . bisacodyl (DULCOLAX) 10 MG suppository Place 1 suppository (10 mg total) rectally daily as needed for moderate constipation. (Patient not taking: Reported on 10/01/2017) 12 suppository 0  . prochlorperazine (COMPAZINE) 10 MG tablet Take 1 tablet (10 mg total) every 6 (six) hours as needed by mouth for nausea or vomiting. (Patient not taking: Reported on 10/01/2017) 40 tablet 1   No  current facility-administered medications for this visit.     PHYSICAL EXAMINATION: ECOG PERFORMANCE STATUS: 2 - Symptomatic, <50% confined to bed  BP 113/80 (BP Location: Left Arm, Patient Position: Sitting)   Pulse 90   Temp 97.8 F (36.6 C) (Tympanic)   Resp 16   There were no vitals filed for this visit.  GENERAL: Well-nourished well-developed; Alert, no distress and comfortable.  Accompanied by husband.  EYES: no pallor or icterus OROPHARYNX: no thrush or ulceration; NECK: supple; no lymph nodes felt. LYMPH:  no palpable lymphadenopathy in the axillary or inguinal regions LUNGS: Decreased breath sounds auscultation bilaterally. No wheeze or crackles HEART/CVS: regular rate & rhythm and no murmurs; No lower extremity edema ABDOMEN:abdomen soft, non-tender and normal bowel sounds. No hepatomegaly or splenomegaly.  Musculoskeletal:no cyanosis of digits and no clubbing  PSYCH: alert & oriented x 3 with fluent speech NEURO: no focal motor/sensory deficits SKIN:  no rashes or significant lesions    LABORATORY DATA:  I have reviewed the data as listed    Component Value Date/Time   NA 137 12/23/2017 0924   K 3.9 12/23/2017 0924   CL 103 12/23/2017 0924   CO2 25 12/23/2017 0924   GLUCOSE 135 (H) 12/23/2017 0924   BUN 13 12/23/2017 0924   CREATININE 0.75 12/23/2017 0924   CALCIUM 8.9 12/23/2017 0924   PROT 6.8 12/23/2017 0924   ALBUMIN 4.2 12/23/2017 0924   AST 26 12/23/2017 0924   ALT 18 12/23/2017 0924   ALKPHOS 69 12/23/2017 0924   BILITOT 0.6 12/23/2017 0924   GFRNONAA >60 12/23/2017 0924   GFRAA >60 12/23/2017 0924    No results found for: SPEP, UPEP  Lab Results  Component Value Date   WBC 5.4 12/23/2017   NEUTROABS 3.9 12/23/2017   HGB 13.3 12/23/2017   HCT 39.2 12/23/2017   MCV 86.4 12/23/2017   PLT 321 12/23/2017      Chemistry      Component Value Date/Time   NA 137 12/23/2017 0924   K 3.9 12/23/2017 0924   CL 103 12/23/2017 0924   CO2 25  12/23/2017 0924   BUN 13 12/23/2017 0924   CREATININE 0.75 12/23/2017 0924      Component Value Date/Time   CALCIUM 8.9 12/23/2017 0924   ALKPHOS 69 12/23/2017  1368   AST 26 12/23/2017 0924   ALT 18 12/23/2017 0924   BILITOT 0.6 12/23/2017 0924       RADIOGRAPHIC STUDIES: I have personally reviewed the radiological images as listed and agreed with the findings in the report. No results found.   ASSESSMENT & PLAN:  Carcinoma of overlapping sites of left breast in female, estrogen receptor negative ([place]) #Metastatic breast cancer ER [name] HER-2/neu positive-May 2019 CT scan NED; improved.  Currently on Herceptin perjeta maintenance.  #Continue Herceptin perjeta today; Labs today reviewed;  acceptable for treatment today.   #Chronic dementia/debility-stable  #Diarrhea-2-3 loose stools over the last 2 days.  Current resolved.  Unlikely from current therapy.  Recommend Imodium.  # follow up in 4 weeks [vacation]/ labs/ cbc/cmp-HP   Orders Placed This Encounter  Procedures  . CBC with Differential/Platelet    Standing Status:   Future    Standing Expiration Date:   12/24/2018  . Comprehensive metabolic panel    Standing Status:   Future    Standing Expiration Date:   12/24/2018   All questions were answered. The patient knows to call the clinic with any problems, questions or concerns.      Cammie Sickle, MD 12/24/2017 6:38 PM

## 2017-12-28 ENCOUNTER — Other Ambulatory Visit: Payer: Medicare Other

## 2018-01-07 ENCOUNTER — Encounter: Payer: Self-pay | Admitting: General Surgery

## 2018-01-07 ENCOUNTER — Ambulatory Visit (INDEPENDENT_AMBULATORY_CARE_PROVIDER_SITE_OTHER): Payer: Medicare Other | Admitting: General Surgery

## 2018-01-07 VITALS — BP 116/68 | HR 76 | Resp 18 | Ht 63.0 in | Wt 159.0 lb

## 2018-01-07 DIAGNOSIS — Z171 Estrogen receptor negative status [ER-]: Secondary | ICD-10-CM

## 2018-01-07 DIAGNOSIS — C50812 Malignant neoplasm of overlapping sites of left female breast: Secondary | ICD-10-CM

## 2018-01-07 NOTE — Patient Instructions (Signed)
  Follow up in one year breast check check up. The patient is aware to call back for any questions or concerns.

## 2018-01-07 NOTE — Progress Notes (Signed)
Patient ID: [name], female   DOB: 1941/03/20, 77 y.o.   MRN: [id number]  Chief Complaint  Patient presents with  . Follow-up    HPI [name] is a 77 y.o. female.  who presents for a breast evaluation. The most recent mammogram was done on 12-03-17. Persistent tenderness left axilla. Patient does perform regular self breast checks and gets regular mammograms done.   She does have progressive degenerative disk disease and back pain has gotten worse. She is here with her husband Dr Tobe Sos.  HPI  Past Medical History:  Diagnosis Date  . Arthritis   . Brain tumor ([place]) 1995   meningeoma  . Breast cancer ([place]) 2016   left breast; surgery 01/11/15 with Dr. Bary Castilla; path with invasive mammary and DCIS  . Breast cancer of upper-inner quadrant of left female breast ([place]) 12/15/14   Completed radiation end of December and finished chemotherapy 2 weeks ago, Left breast invasive mammary carcinoma, T1cN56mc (1.5 cm); Grade 3, [name], [name], [name], .  .Marland KitchenCataract    bilat   . DDD (degenerative disc disease), lumbar    Lumbar, previously evaluated by Dr. HEarnestine Leys . Dementia   . Fibrocystic breast disease    prior biopsy  . Glaucoma   . 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 Hearing loss   . History of cancer chemotherapy   . History of radiation therapy   . Hyperlipidemia, unspecified   . Imbalance   . Memory impairment    seen by Dr SManuella Ghazi possible post crainiotomy from radiation  . Meningioma ([place]    Left cavernous sinus meningioma[facility]  . Numbness and tingling    right hand   . Osteoarthritis   . Osteoporosis, post-menopausal   . Personal history of chemotherapy   . Personal history of radiation therapy   . Shoulder pain, right   . Wears glasses     Past Surgical History:  Procedure Laterality Date  . APPENDECTOMY  1950  . BRAIN SURGERY  1995   left  frontal/temporal  . BREAST BIOPSY Left 1997  . BREAST BIOPSY Left 12/15/14   confirmed DCIS  . BREAST BIOPSY Left 01/08/2015   Procedure: BREAST BIOPSY WITH NEEDLE LOCALIZATION;  Surgeon: [name] MD;  Location: [facility];  Service: General;  Laterality: Left;  . BREAST LUMPECTOMY Left 01/08/2015   Procedure: LUMPECTOMY;  Surgeon: [name] MD;  Location: [facility];  Service: General;  Laterality: Left;  . COLONOSCOPY  2010   Dr. ETiffany Kocher . ORIF ANKLE FRACTURE Right 08/05/2017   Procedure: OPEN REDUCTION INTERNAL FIXATION (ORIF) ANKLE FRACTURE;  Surgeon: MEarnestine Leys MD;  Location: [facility];  Service: Orthopedics;  Laterality: Right;  . PORTACATH PLACEMENT Right 01/16/2015   Procedure: INSERTION PORT-A-CATH;  Surgeon: [name] MD;  Location: [facility];  Service: General;  Laterality: Right;  . SENTINEL NODE BIOPSY Left 01/16/2015   Procedure: SENTINEL NODE BIOPSY;  Surgeon: [name] MD;  Location: [facility];  Service: General;  Laterality: Left;  . TOTAL HIP ARTHROPLASTY Right 09/04/2015   Procedure: RIGHT TOTAL HIP ARTHROPLASTY ANTERIOR APPROACH;  Surgeon: MParalee Cancel MD;  Location: [facility];  Service: Orthopedics;  Laterality: Right;    Family History  Problem Relation Age of Onset  . Breast cancer Sister 440 . Addison's disease Mother   .  Hyperthyroidism Mother   . Osteoarthritis Mother   . Stroke Father   . Heart disease Father   . High blood pressure Father     Social History Social History   Tobacco Use  . Smoking status: Former Smoker    Packs/day: 0.25    Years: 5.00    Pack years: 1.25    Types: Cigarettes    Last attempt to quit: 07/28/1972    Years since quitting: 45.4  . Smokeless tobacco: Never Used  Substance Use Topics  . Alcohol use: Yes    Alcohol/week: 0.6 - 1.2 oz    Types: 1 - 2 Glasses of wine per week    Comment: 1 Glass Wine / Night  . Drug use: No    Allergies  Allergen Reactions  . Donepezil     Other  reaction(s): Other (See Comments) Nightmares    Current Outpatient Medications  Medication Sig Dispense Refill  . acetaminophen (TYLENOL) 325 MG tablet Take 650 mg by mouth every 4 (four) hours as needed. Pain / increased temp.    Marland Kitchen aspirin 325 MG EC tablet Take 325 mg by mouth 2 (two) times daily.     . Calcium Carbonate-Vit D-Min (CALTRATE 600+D PLUS MINERALS) 600-800 MG-UNIT TABS Take 1 tablet by mouth every morning.     . celecoxib (CELEBREX) 200 MG capsule Take 200 mg by mouth 2 (two) times daily.    . Cholecalciferol (VITAMIN D-3) 5000 units TABS Take 1 tablet by mouth daily.    Marland Kitchen levETIRAcetam (KEPPRA) 250 MG tablet 1 tablet daily at bedtime  5  . loratadine (CLARITIN) 10 MG tablet Take 10 mg by mouth daily as needed for allergies.     Marland Kitchen prochlorperazine (COMPAZINE) 10 MG tablet Take 1 tablet (10 mg total) every 6 (six) hours as needed by mouth for nausea or vomiting. 40 tablet 1  . vitamin C (ASCORBIC ACID) 250 MG tablet Take 250 mg by mouth daily.    . vitamin E 400 UNIT capsule Take 400 Units by mouth daily.     . bisacodyl (DULCOLAX) 10 MG suppository Place 1 suppository (10 mg total) rectally daily as needed for moderate constipation. (Patient not taking: Reported on 01/07/2018) 12 suppository 0  . traMADol (ULTRAM) 50 MG tablet Take 50 mg by mouth 2 (two) times daily as needed for moderate pain.      No current facility-administered medications for this visit.     Review of Systems Review of Systems  Constitutional: Negative.   Respiratory: Negative.   Cardiovascular: Negative.     Blood pressure 116/68, pulse 76, resp. rate 18, height _0  (1.6 m), weight 159 lb (72.1 kg).  Physical Exam Physical Exam  Constitutional: She is oriented to person, place, and time. She appears well-developed and well-nourished.  HENT:  Mouth/Throat: Oropharynx is clear and moist.  Eyes: Conjunctivae are normal. No scleral icterus.  Neck: Neck supple.  Cardiovascular: Normal rate,  regular rhythm and normal heart sounds.  Pulmonary/Chest: Effort normal and breath sounds normal. Right breast exhibits no inverted nipple, no mass, no nipple discharge, no skin change and no tenderness. Left breast exhibits no inverted nipple, no mass, no nipple discharge, no skin change and no tenderness.    Lymphadenopathy:    She has no cervical adenopathy.    She has no axillary adenopathy.  Neurological: She is alert and oriented to person, place, and time.  Skin: Skin is warm and dry.  Psychiatric: Her behavior is normal.  Data Reviewed Bilateral breast mammogram and left ultrasound of Dec 03, 2017 reviewed.  BI-RADS-2. CT of the chest abdomen and pelvis from Nov 30, 2017 reviewed.  No interval change.  No evidence of new metastatic disease.  Assessment    Stable breast disease.    Plan  Patient and family desire to continue annual follow-up here and even in light of known metastatic disease, carefully being followed by medical oncology. Follow up in one year breast check check up. The patient is aware to call back for any questions or concerns.   HPI, Physical Exam, Assessment and Plan have been scribed under the direction and in the presence of [name], MD. Karie Fetch, RN  I have completed the exam and reviewed the above documentation for accuracy and completeness.  I agree with the above.  Haematologist has been used and any errors in dictation or transcription are unintentional.  Hervey Ard, M.D., F.A.C.S.  [name] Stephaun Million 01/09/2018, 1:00 PM

## 2018-01-20 ENCOUNTER — Inpatient Hospital Stay: Payer: Medicare Other

## 2018-01-20 ENCOUNTER — Other Ambulatory Visit: Payer: Self-pay

## 2018-01-20 ENCOUNTER — Encounter: Payer: Self-pay | Admitting: Internal Medicine

## 2018-01-20 ENCOUNTER — Inpatient Hospital Stay (HOSPITAL_BASED_OUTPATIENT_CLINIC_OR_DEPARTMENT_OTHER): Payer: Medicare Other | Admitting: Internal Medicine

## 2018-01-20 ENCOUNTER — Inpatient Hospital Stay: Payer: Medicare Other | Attending: Internal Medicine

## 2018-01-20 VITALS — BP 117/69 | HR 84 | Temp 97.8°F | Resp 20 | Ht 63.0 in | Wt 155.9 lb

## 2018-01-20 DIAGNOSIS — Z5112 Encounter for antineoplastic immunotherapy: Secondary | ICD-10-CM | POA: Diagnosis not present

## 2018-01-20 DIAGNOSIS — C50812 Malignant neoplasm of overlapping sites of left female breast: Secondary | ICD-10-CM

## 2018-01-20 DIAGNOSIS — Z171 Estrogen receptor negative status [ER-]: Secondary | ICD-10-CM | POA: Insufficient documentation

## 2018-01-20 LAB — CBC WITH DIFFERENTIAL/PLATELET
BASOS PCT: 1 %
Basophils Absolute: 0 10*3/uL (ref 0–0.1)
Eosinophils Absolute: 0.3 10*3/uL (ref 0–0.7)
Eosinophils Relative: 5 %
HEMATOCRIT: 39.2 % (ref 35.0–47.0)
Hemoglobin: 13.4 g/dL (ref 12.0–16.0)
LYMPHS ABS: 1 10*3/uL (ref 1.0–3.6)
LYMPHS PCT: 19 %
MCH: 30.2 pg (ref 26.0–34.0)
MCHC: 34.2 g/dL (ref 32.0–36.0)
MCV: 88.2 fL (ref 80.0–100.0)
MONO ABS: 0.4 10*3/uL (ref 0.2–0.9)
MONOS PCT: 8 %
NEUTROS ABS: 3.6 10*3/uL (ref 1.4–6.5)
NEUTROS PCT: 67 %
Platelets: 312 10*3/uL (ref 150–440)
RBC: 4.45 MIL/uL (ref 3.80–5.20)
RDW: 15.6 % — AB (ref 11.5–14.5)
WBC: 5.3 10*3/uL (ref 3.6–11.0)

## 2018-01-20 LAB — COMPREHENSIVE METABOLIC PANEL
ALBUMIN: 3.9 g/dL (ref 3.5–5.0)
ALK PHOS: 69 U/L (ref 38–126)
ALT: 18 U/L (ref 0–44)
ANION GAP: 9 (ref 5–15)
AST: 30 U/L (ref 15–41)
BUN: 16 mg/dL (ref 8–23)
CALCIUM: 8.7 mg/dL — AB (ref 8.9–10.3)
CHLORIDE: 104 mmol/L (ref 98–111)
CO2: 24 mmol/L (ref 22–32)
Creatinine, Ser: 0.78 mg/dL (ref 0.44–1.00)
GFR calc non Af Amer: >60 mL/min (ref >60)
GLUCOSE: 139 mg/dL — AB (ref 70–99)
POTASSIUM: 3.9 mmol/L (ref 3.5–5.1)
SODIUM: 137 mmol/L (ref 135–145)
Total Bilirubin: 0.6 mg/dL (ref 0.3–1.2)
Total Protein: 6.8 g/dL (ref 6.5–8.1)

## 2018-01-20 MED ORDER — SODIUM CHLORIDE 0.9 % IV SOLN
Freq: Once | INTRAVENOUS | Status: AC
  Administered 2018-01-20: 10:00:00 via INTRAVENOUS
  Filled 2018-01-20: qty 1000

## 2018-01-20 MED ORDER — TRASTUZUMAB CHEMO 150 MG IV SOLR
450.0000 mg | Freq: Once | INTRAVENOUS | Status: AC
  Administered 2018-01-20: 450 mg via INTRAVENOUS
  Filled 2018-01-20: qty 21.43

## 2018-01-20 MED ORDER — SODIUM CHLORIDE 0.9% FLUSH
10.0000 mL | INTRAVENOUS | Status: DC | PRN
  Administered 2018-01-20: 10 mL via INTRAVENOUS
  Filled 2018-01-20: qty 10

## 2018-01-20 MED ORDER — DIPHENHYDRAMINE HCL 50 MG/ML IJ SOLN
12.5000 mg | Freq: Once | INTRAMUSCULAR | Status: AC
  Administered 2018-01-20: 12.5 mg via INTRAVENOUS
  Filled 2018-01-20: qty 1

## 2018-01-20 MED ORDER — HEPARIN SOD (PORK) LOCK FLUSH 100 UNIT/ML IV SOLN
500.0000 [IU] | Freq: Once | INTRAVENOUS | Status: AC
  Administered 2018-01-20: 500 [IU] via INTRAVENOUS
  Filled 2018-01-20: qty 5

## 2018-01-20 MED ORDER — ACETAMINOPHEN 325 MG PO TABS
650.0000 mg | ORAL_TABLET | Freq: Once | ORAL | Status: AC
  Administered 2018-01-20: 650 mg via ORAL
  Filled 2018-01-20: qty 2

## 2018-01-20 MED ORDER — SODIUM CHLORIDE 0.9 % IV SOLN
420.0000 mg | Freq: Once | INTRAVENOUS | Status: AC
  Administered 2018-01-20: 420 mg via INTRAVENOUS
  Filled 2018-01-20: qty 14

## 2018-01-20 NOTE — Progress Notes (Signed)
[name] OFFICE PROGRESS NOTE  Patient Care Team: [name], [name], MD (General Surgery) Requested, [name]   Oncology History   # June 2016- LEFT BREAST CA;  [name] ; [name] ; [name]; Her 2 Neu POS; [place] from July OF 2016; [name]; adjuvant Herceptin [ Finished July 2017]; AUG 2017- Neratinib x5 days; DISCON sec to diarrhea  # MID OCT 2018- Right breast mass-Bx- ER/PR-NEG; Her 2 NEU POSITIVE 1~2.5cm;  [?NEW primary]  # MID-OCT 2018-METASTATIC RECURRENT-oh sternal mass; Left Ax LN [Bx]/periportal LN  # OCT 25th 2018- TAXOL-HERCEPTIN-PERJETA; Jan 2019- CT PR; continue HP only.    -------------------------------------------------------------------------------   # MUGA scan- July 28th 2017- 67%.  # chronic gait/balance issues  # June 2017- left breast Bx- fat necrosis [[name]  # july 2017-  BRCA 1& 2- NEG.   MOLECULAR TESTING- F ONE- TPS- 0%; pending; ERB2 amplification; PI3K/RET amplification Others**  --------------------------------------------------    DIAGNOSIS: [ OCT 2018]- REC/MET- BREAST CA ER/PR-NEG; her 2 POS  STAGE: 4       ;GOALS: Palliative  CURRENT/MOST RECENT THERAPY- Herceptin-Perjeta      Carcinoma of overlapping sites of left breast in female, estrogen receptor negative ([place])      INTERVAL HISTORY:  [name] 77 y.o.  female pleasant patient above history of metastatic ER [name] HER-2/neu positive breast cancer currently on Herceptin/perjeta maintenance.  Patient and her family just returned from a vacation.  Vacation was uneventful.  Patient denies any nausea vomiting.  Denies any diarrhea.  No skin rash.  Continues to complain of hip pain/back pain chronic.  Followed by orthopedics.  Review of Systems  Constitutional: Negative  for chills, diaphoresis, fever, malaise/fatigue and weight loss.  HENT: Negative for nosebleeds and sore throat.   Eyes: Negative for double vision.  Respiratory: Negative for cough, hemoptysis, sputum production, shortness of breath and wheezing.   Cardiovascular: Negative for chest pain, palpitations, orthopnea and leg swelling.  Gastrointestinal: Negative for abdominal pain, blood in stool, constipation, diarrhea, heartburn, melena, nausea and vomiting.  Genitourinary: Negative for dysuria, frequency and urgency.  Musculoskeletal: Positive for back pain and joint pain.  Skin: Negative.  Negative for itching and rash.  Neurological: Negative for dizziness, tingling, focal weakness, weakness and headaches.  Endo/Heme/Allergies: Does not bruise/bleed easily.  Psychiatric/Behavioral: Positive for memory loss. Negative for depression. The patient is not nervous/anxious and does not have insomnia.       PAST MEDICAL HISTORY :  Past Medical History:  Diagnosis Date  . Arthritis   . Brain tumor ([place]) 1995   meningeoma  . Breast cancer ([place]) 2016   left breast; surgery 01/11/15 with Dr. Bary Castilla; path with invasive mammary and DCIS  . Breast cancer of upper-inner quadrant of left female breast ([place]) 12/15/14   Completed radiation end of December and finished chemotherapy 2 weeks ago, Left breast invasive mammary carcinoma, T1cN29mc (1.5 cm); Grade 3, [name], [name], [name], .  .Marland KitchenCataract    bilat   . DDD (degenerative disc disease), lumbar    Lumbar, previously evaluated by Dr. HEarnestine Leys . Dementia   . Fibrocystic breast disease    prior biopsy  . Glaucoma   . 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  Hearing loss   . History of cancer chemotherapy   . History of radiation therapy   . Hyperlipidemia, unspecified   . Imbalance   . Memory impairment    seen by Dr Manuella Ghazi  possible post crainiotomy from radiation  . Meningioma ([place])     Left cavernous sinus meningioma, treated with resection and [facility] therapy at [facility], 1995.  . Numbness and tingling    right hand   . Osteoarthritis   . Osteoporosis, post-menopausal   . Personal history of chemotherapy   . Personal history of radiation therapy   . Shoulder pain, right   . Wears glasses     PAST SURGICAL HISTORY :   Past Surgical History:  Procedure Laterality Date  . APPENDECTOMY  1950  . BRAIN SURGERY  1995   left frontal/temporal  . BREAST BIOPSY Left 1997  . BREAST BIOPSY Left 12/15/14   confirmed DCIS  . BREAST BIOPSY Left 01/08/2015   Procedure: BREAST BIOPSY WITH NEEDLE LOCALIZATION;  Surgeon: [name], MD;  Location: [facility];  Service: General;  Laterality: Left;  . BREAST LUMPECTOMY Left 01/08/2015   Procedure: LUMPECTOMY;  Surgeon: [name], MD;  Location: [facility];  Service: General;  Laterality: Left;  . COLONOSCOPY  2010   Dr. [name]  . ORIF ANKLE FRACTURE Right 08/05/2017   Procedure: OPEN REDUCTION INTERNAL FIXATION (ORIF) ANKLE FRACTURE;  Surgeon: Earnestine Leys, MD;  Location: [facility];  Service: Orthopedics;  Laterality: Right;  . PORTACATH PLACEMENT Right 01/16/2015   Procedure: INSERTION PORT-A-CATH;  Surgeon: [name], MD;  Location: [facility];  Service: General;  Laterality: Right;  . SENTINEL NODE BIOPSY Left 01/16/2015   Procedure: SENTINEL NODE BIOPSY;  Surgeon: [name], MD;  Location: [facility];  Service: General;  Laterality: Left;  . TOTAL HIP ARTHROPLASTY Right 09/04/2015   Procedure: RIGHT TOTAL HIP ARTHROPLASTY ANTERIOR APPROACH;  Surgeon: [name], MD;  Location: [facility];  Service: Orthopedics;  Laterality: Right;    FAMILY HISTORY :   Family History  Problem Relation Age of Onset  . Breast cancer Sister 31  . Addison's disease Mother   . Hyperthyroidism Mother   . Osteoarthritis Mother   . Stroke Father   . Heart disease Father   . High blood pressure Father     SOCIAL HISTORY:   Social  History   Tobacco Use  . Smoking status: Former Smoker    Packs/day: 0.25    Years: 5.00    Pack years: 1.25    Types: Cigarettes    Last attempt to quit: 07/28/1972    Years since quitting: 45.5  . Smokeless tobacco: Never Used  Substance Use Topics  . Alcohol use: Yes    Alcohol/week: 0.6 - 1.2 oz    Types: 1 - 2 Glasses of wine per week    Comment: 1 Glass Wine / Night  . Drug use: No    ALLERGIES:  is allergic to donepezil.  MEDICATIONS:  Current Outpatient Medications  Medication Sig Dispense Refill  . Calcium Carbonate-Vit D-Min (CALTRATE 600+D PLUS MINERALS) 600-800 MG-UNIT TABS Take 1 tablet by mouth every morning.     . celecoxib (CELEBREX) 200 MG capsule Take 200 mg by mouth 2 (two) times daily.    . Cholecalciferol (VITAMIN D-3) 5000 units TABS Take 1 tablet by mouth daily.    Marland Kitchen levETIRAcetam (KEPPRA) 250 MG tablet 1 tablet daily at bedtime  5  . loratadine (CLARITIN) 10 MG tablet Take  10 mg by mouth daily as needed for allergies.     Marland Kitchen prochlorperazine (COMPAZINE) 10 MG tablet Take 1 tablet (10 mg total) every 6 (six) hours as needed by mouth for nausea or vomiting. 40 tablet 1  . traMADol (ULTRAM) 50 MG tablet Take 50 mg by mouth 2 (two) times daily as needed for moderate pain.     . vitamin C (ASCORBIC ACID) 250 MG tablet Take 250 mg by mouth daily.    . vitamin E 400 UNIT capsule Take 400 Units by mouth daily.     Marland Kitchen acetaminophen (TYLENOL) 325 MG tablet Take 650 mg by mouth every 4 (four) hours as needed. Pain / increased temp.    Marland Kitchen aspirin 325 MG EC tablet Take 325 mg by mouth 2 (two) times daily.     . bisacodyl (DULCOLAX) 10 MG suppository Place 1 suppository (10 mg total) rectally daily as needed for moderate constipation. (Patient not taking: Reported on 01/07/2018) 12 suppository 0   No current facility-administered medications for this visit.     PHYSICAL EXAMINATION: ECOG PERFORMANCE STATUS: 0 - Asymptomatic  BP 117/69 (Patient Position: Sitting)    Pulse 84   Temp 97.8 F (36.6 C) (Tympanic)   Resp 20   Ht 5' 3"  (1.6 m)   Wt 155 lb 14.4 oz (70.7 kg)   BMI 27.62 kg/m   Filed Weights   01/20/18 0844  Weight: 155 lb 14.4 oz (70.7 kg)    GENERAL: Well-nourished well-developed; Alert, no distress and comfortable.accompanied by family.  EYES: no pallor or icterus OROPHARYNX: no thrush or ulceration; NECK: supple; no lymph nodes felt. LYMPH:  no palpable lymphadenopathy in the axillary or inguinal regions LUNGS: Decreased breath sounds auscultation bilaterally. No wheeze or crackles HEART/CVS: regular rate & rhythm and no murmurs; No lower extremity edema ABDOMEN:abdomen soft, non-tender and normal bowel sounds. No hepatomegaly or splenomegaly.  Musculoskeletal:no cyanosis of digits and no clubbing  PSYCH: alert & oriented x 3 with fluent speech NEURO: no focal motor/sensory deficits SKIN:  no rashes or significant lesions    LABORATORY DATA:  I have reviewed the data as listed    Component Value Date/Time   NA 137 01/20/2018 0827   K 3.9 01/20/2018 0827   CL 104 01/20/2018 0827   CO2 24 01/20/2018 0827   GLUCOSE 139 (H) 01/20/2018 0827   BUN 16 01/20/2018 0827   CREATININE 0.78 01/20/2018 0827   CALCIUM 8.7 (L) 01/20/2018 0827   PROT 6.8 01/20/2018 0827   ALBUMIN 3.9 01/20/2018 0827   AST 30 01/20/2018 0827   ALT 18 01/20/2018 0827   ALKPHOS 69 01/20/2018 0827   BILITOT 0.6 01/20/2018 0827   GFRNONAA >60 01/20/2018 0827   GFRAA >60 01/20/2018 0827    No results found for: SPEP, UPEP  Lab Results  Component Value Date   WBC 5.3 01/20/2018   NEUTROABS 3.6 01/20/2018   HGB 13.4 01/20/2018   HCT 39.2 01/20/2018   MCV 88.2 01/20/2018   PLT 312 01/20/2018      Chemistry      Component Value Date/Time   NA 137 01/20/2018 0827   K 3.9 01/20/2018 0827   CL 104 01/20/2018 0827   CO2 24 01/20/2018 0827   BUN 16 01/20/2018 0827   CREATININE 0.78 01/20/2018 0827      Component Value Date/Time   CALCIUM 8.7  (L) 01/20/2018 0827   ALKPHOS 69 01/20/2018 0827   AST 30 01/20/2018 0827   ALT 18 01/20/2018  0827   BILITOT 0.6 01/20/2018 0827       RADIOGRAPHIC STUDIES: I have personally reviewed the radiological images as listed and agreed with the findings in the report. No results found.   ASSESSMENT & PLAN:  Carcinoma of overlapping sites of left breast in female, estrogen receptor negative ([place]) #Metastatic breast cancer ER [name] HER-2/neu positive-May 2019 CT scan NED; stable; on Herceptin/perjeta maintenance.  #Continue Herceptin/perjeta; labs adequate.  #Chronic dementia/debility-stable not any worse.  #Diarrhea resolved.  # follow up in 3 weekslabs/ cbc/cmp-HP- aug sep scans again.   Orders Placed This Encounter  Procedures  . CBC with Differential    Standing Status:   Future    Standing Expiration Date:   01/21/2019  . Comprehensive metabolic panel    Standing Status:   Future    Standing Expiration Date:   01/21/2019   All questions were answered. The patient knows to call the clinic with any problems, questions or concerns.      Cammie Sickle, MD 01/24/2018 10:58 PM

## 2018-01-20 NOTE — Assessment & Plan Note (Addendum)
#  Metastatic breast cancer ER [name] HER-2/neu positive-May 2019 CT scan NED; stable; on Herceptin/perjeta maintenance.  #Continue Herceptin/perjeta; labs adequate.  #Chronic dementia/debility-stable not any worse.  #Diarrhea resolved.  # follow up in 3 weekslabs/ cbc/cmp-HP- aug sep scans again.

## 2018-02-10 ENCOUNTER — Other Ambulatory Visit: Payer: Self-pay

## 2018-02-10 ENCOUNTER — Inpatient Hospital Stay (HOSPITAL_BASED_OUTPATIENT_CLINIC_OR_DEPARTMENT_OTHER): Payer: Medicare Other | Admitting: Internal Medicine

## 2018-02-10 ENCOUNTER — Inpatient Hospital Stay: Payer: Medicare Other

## 2018-02-10 ENCOUNTER — Inpatient Hospital Stay: Payer: Medicare Other | Attending: Internal Medicine

## 2018-02-10 VITALS — BP 121/78 | HR 80 | Temp 98.6°F | Resp 20 | Ht 63.0 in | Wt 160.2 lb

## 2018-02-10 DIAGNOSIS — Z171 Estrogen receptor negative status [ER-]: Secondary | ICD-10-CM | POA: Insufficient documentation

## 2018-02-10 DIAGNOSIS — Z5112 Encounter for antineoplastic immunotherapy: Secondary | ICD-10-CM | POA: Insufficient documentation

## 2018-02-10 DIAGNOSIS — C50812 Malignant neoplasm of overlapping sites of left female breast: Secondary | ICD-10-CM | POA: Insufficient documentation

## 2018-02-10 DIAGNOSIS — Z79899 Other long term (current) drug therapy: Secondary | ICD-10-CM

## 2018-02-10 DIAGNOSIS — Z5181 Encounter for therapeutic drug level monitoring: Secondary | ICD-10-CM

## 2018-02-10 LAB — CBC WITH DIFFERENTIAL/PLATELET
BASOS ABS: 0 10*3/uL (ref 0–0.1)
BASOS PCT: 1 %
EOS ABS: 0.2 10*3/uL (ref 0–0.7)
Eosinophils Relative: 4 %
HEMATOCRIT: 39.8 % (ref 35.0–47.0)
HEMOGLOBIN: 13.6 g/dL (ref 12.0–16.0)
Lymphocytes Relative: 15 %
Lymphs Abs: 1 10*3/uL (ref 1.0–3.6)
MCH: 30.7 pg (ref 26.0–34.0)
MCHC: 34.3 g/dL (ref 32.0–36.0)
MCV: 89.5 fL (ref 80.0–100.0)
Monocytes Absolute: 0.5 10*3/uL (ref 0.2–0.9)
Monocytes Relative: 7 %
NEUTROS ABS: 4.7 10*3/uL (ref 1.4–6.5)
NEUTROS PCT: 73 %
PLATELETS: 343 10*3/uL (ref 150–440)
RBC: 4.45 MIL/uL (ref 3.80–5.20)
RDW: 15.3 % — ABNORMAL HIGH (ref 11.5–14.5)
WBC: 6.5 10*3/uL (ref 3.6–11.0)

## 2018-02-10 LAB — COMPREHENSIVE METABOLIC PANEL
ALK PHOS: 79 U/L (ref 38–126)
ALT: 18 U/L (ref 0–44)
AST: 31 U/L (ref 15–41)
Albumin: 4 g/dL (ref 3.5–5.0)
Anion gap: 12 (ref 5–15)
BUN: 18 mg/dL (ref 8–23)
CALCIUM: 9 mg/dL (ref 8.9–10.3)
CO2: 24 mmol/L (ref 22–32)
CREATININE: 0.79 mg/dL (ref 0.44–1.00)
Chloride: 104 mmol/L (ref 98–111)
Glucose, Bld: 137 mg/dL — ABNORMAL HIGH (ref 70–99)
Potassium: 4.2 mmol/L (ref 3.5–5.1)
SODIUM: 140 mmol/L (ref 135–145)
Total Bilirubin: 0.7 mg/dL (ref 0.3–1.2)
Total Protein: 6.9 g/dL (ref 6.5–8.1)

## 2018-02-10 MED ORDER — TRASTUZUMAB CHEMO 150 MG IV SOLR
450.0000 mg | Freq: Once | INTRAVENOUS | Status: AC
  Administered 2018-02-10: 450 mg via INTRAVENOUS
  Filled 2018-02-10: qty 21.43

## 2018-02-10 MED ORDER — SODIUM CHLORIDE 0.9 % IV SOLN
420.0000 mg | Freq: Once | INTRAVENOUS | Status: AC
  Administered 2018-02-10: 420 mg via INTRAVENOUS
  Filled 2018-02-10: qty 14

## 2018-02-10 MED ORDER — DIPHENHYDRAMINE HCL 50 MG/ML IJ SOLN
12.5000 mg | Freq: Once | INTRAMUSCULAR | Status: AC
  Administered 2018-02-10: 12.5 mg via INTRAVENOUS
  Filled 2018-02-10: qty 1

## 2018-02-10 MED ORDER — ACETAMINOPHEN 325 MG PO TABS
650.0000 mg | ORAL_TABLET | Freq: Once | ORAL | Status: AC
  Administered 2018-02-10: 650 mg via ORAL
  Filled 2018-02-10: qty 2

## 2018-02-10 MED ORDER — TRAMADOL HCL 50 MG PO TABS: 50.0000 mg | ORAL_TABLET | Freq: Two times a day (BID) | ORAL | 0 refills | Status: DC | PRN

## 2018-02-10 MED ORDER — HEPARIN SOD (PORK) LOCK FLUSH 100 UNIT/ML IV SOLN
500.0000 [IU] | Freq: Once | INTRAVENOUS | Status: AC | PRN
  Administered 2018-02-10: 500 [IU]
  Filled 2018-02-10: qty 5

## 2018-02-10 MED ORDER — SODIUM CHLORIDE 0.9 % IV SOLN
Freq: Once | INTRAVENOUS | Status: AC
  Administered 2018-02-10: 10:00:00 via INTRAVENOUS
  Filled 2018-02-10: qty 1000

## 2018-02-10 NOTE — Progress Notes (Signed)
[place] OFFICE PROGRESS NOTE  Patient Care Team: [name], [name], MD (General Surgery) Requested, [name]   Oncology History   # June 2016- LEFT BREAST CA;  [name] ; [name] ; [name]; Her 2 Neu POS; [place] from July OF 2016; [name]; adjuvant Herceptin [ Finished July 2017]; AUG 2017- Neratinib x5 days; DISCON sec to diarrhea  # MID OCT 2018- Right breast mass-Bx- ER/PR-NEG; Her 2 NEU POSITIVE 1~2.5cm;  [?NEW primary]  # MID-OCT 2018-METASTATIC RECURRENT-oh sternal mass; Left Ax LN [Bx]/periportal LN  # OCT 25th 2018- TAXOL-HERCEPTIN-PERJETA; Jan 2019- CT PR; continue HP only.    -------------------------------------------------------------------------------   # MUGA scan- July 28th 2017- 67%.  # chronic gait/balance issues  # June 2017- left breast Bx- fat necrosis [[name]  # july 2017-  BRCA 1& 2- NEG.   MOLECULAR TESTING- F ONE- TPS- 0%; pending; ERB2 amplification; PI3K/RET amplification Others**  --------------------------------------------------    DIAGNOSIS: [ OCT 2018]- REC/MET- BREAST CA ER/PR-NEG; her 2 POS  STAGE: 4       ;GOALS: Palliative  CURRENT/MOST RECENT THERAPY- Herceptin-Perjeta      Carcinoma of overlapping sites of left breast in female, estrogen receptor negative ([place])      INTERVAL HISTORY:  [name] 77 y.o.  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  Patient continues to have chronic mild fatigue.  Chronic memory problems.  These are not getting any worse.  Chronic back pain/hip pain not any worse.  Interested in prescription for tramadol.  Chronic mild dizziness.  Unstable gait for which she walks with a cane.  No  falls.  Review of Systems  Constitutional: Positive for malaise/fatigue. Negative for chills, diaphoresis, fever and weight loss.  HENT: Negative for nosebleeds and sore throat.   Eyes: Negative for double vision.  Respiratory: Negative for cough, hemoptysis, sputum production, shortness of breath and wheezing.   Cardiovascular: Negative for chest pain, palpitations, orthopnea and leg swelling.  Gastrointestinal: Negative for abdominal pain, blood in stool, constipation, diarrhea, heartburn, melena, nausea and vomiting.  Genitourinary: Negative for dysuria, frequency and urgency.  Musculoskeletal: Positive for back pain and joint pain.  Skin: Negative.  Negative for itching and rash.  Neurological: Positive for dizziness and tingling. Negative for focal weakness, weakness and headaches.  Endo/Heme/Allergies: Does not bruise/bleed easily.  Psychiatric/Behavioral: Positive for memory loss. Negative for depression. The patient is not nervous/anxious and does not have insomnia.       PAST MEDICAL HISTORY :  Past Medical History:  Diagnosis Date  . Arthritis   . Brain tumor ([place]) 1995   meningeoma  . Breast cancer ([place]) 2016   left breast; surgery 01/11/15 with Dr. Bary Castilla; path with invasive mammary and DCIS  . Breast cancer of upper-inner quadrant of left female breast ([place]) 12/15/14   Completed radiation end of December and finished chemotherapy 2 weeks ago, Left breast invasive mammary carcinoma, T1cN84mc (1.5 cm); Grade 3, [name], [name], [name], .  .Marland KitchenCataract    bilat   . DDD (degenerative disc disease), lumbar    Lumbar, previously evaluated by Dr. HEarnestine Leys . Dementia   . Fibrocystic breast disease    prior biopsy  .  Glaucoma   . 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 Hearing loss   . History of cancer chemotherapy   . History of radiation therapy   . Hyperlipidemia, unspecified   . Imbalance   . Memory impairment     seen by Dr Manuella Ghazi  possible post crainiotomy from radiation  . 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  . Numbness and tingling    right hand   . Osteoarthritis   . Osteoporosis, post-menopausal   . Personal history of chemotherapy   . Personal history of radiation therapy   . Shoulder pain, right   . Wears glasses     PAST SURGICAL HISTORY :   Past Surgical History:  Procedure Laterality Date  . APPENDECTOMY  1950  . BRAIN SURGERY  1995   left frontal/temporal  . BREAST BIOPSY Left 1997  . BREAST BIOPSY Left 12/15/14   confirmed DCIS  . BREAST BIOPSY Left 01/08/2015   Procedure: BREAST BIOPSY WITH NEEDLE LOCALIZATION;  Surgeon: [name], MD;  Location: [facility];  Service: General;  Laterality: Left;  . BREAST LUMPECTOMY Left 01/08/2015   Procedure: LUMPECTOMY;  Surgeon: [name], MD;  Location: [facility];  Service: General;  Laterality: Left;  . COLONOSCOPY  2010   Dr. [name]  . ORIF ANKLE FRACTURE Right 08/05/2017   Procedure: OPEN REDUCTION INTERNAL FIXATION (ORIF) ANKLE FRACTURE;  Surgeon: Earnestine Leys, MD;  Location: [facility];  Service: Orthopedics;  Laterality: Right;  . PORTACATH PLACEMENT Right 01/16/2015   Procedure: INSERTION PORT-A-CATH;  Surgeon: [name], MD;  Location: [facility];  Service: General;  Laterality: Right;  . SENTINEL NODE BIOPSY Left 01/16/2015   Procedure: SENTINEL NODE BIOPSY;  Surgeon: [name], MD;  Location: [facility];  Service: General;  Laterality: Left;  . TOTAL HIP ARTHROPLASTY Right 09/04/2015   Procedure: RIGHT TOTAL HIP ARTHROPLASTY ANTERIOR APPROACH;  Surgeon: [name], MD;  Location: [facility];  Service: Orthopedics;  Laterality: Right;    FAMILY HISTORY :   Family History  Problem Relation Age of Onset  . Breast cancer Sister 89  . Addison's disease Mother   . Hyperthyroidism Mother   . Osteoarthritis Mother   . Stroke Father   .  Heart disease Father   . High blood pressure Father     SOCIAL HISTORY:   Social History   Tobacco Use  . Smoking status: Former Smoker    Packs/day: 0.25    Years: 5.00    Pack years: 1.25    Types: Cigarettes    Last attempt to quit: 07/28/1972    Years since quitting: 45.5  . Smokeless tobacco: Never Used  Substance Use Topics  . Alcohol use: Yes    Alcohol/week: 0.6 - 1.2 oz    Types: 1 - 2 Glasses of wine per week    Comment: 1 Glass Wine / Night  . Drug use: No    ALLERGIES:  is allergic to donepezil.  MEDICATIONS:  Current Outpatient Medications  Medication Sig Dispense Refill  . acetaminophen (TYLENOL) 325 MG tablet Take 650 mg by mouth every 4 (four) hours as needed. Pain / increased temp.    Marland Kitchen aspirin 325 MG EC tablet Take 325 mg by mouth 2 (two) times daily.     . Calcium Carbonate-Vit D-Min (CALTRATE 600+D PLUS MINERALS) 600-800 MG-UNIT TABS Take 1 tablet by mouth every  morning.     . celecoxib (CELEBREX) 200 MG capsule Take 200 mg by mouth 2 (two) times daily.    . Cholecalciferol (VITAMIN D-3) 5000 units TABS Take 1 tablet by mouth daily.    Marland Kitchen levETIRAcetam (KEPPRA) 250 MG tablet 1 tablet daily at bedtime  5  . loratadine (CLARITIN) 10 MG tablet Take 10 mg by mouth daily as needed for allergies.     Marland Kitchen prochlorperazine (COMPAZINE) 10 MG tablet Take 1 tablet (10 mg total) every 6 (six) hours as needed by mouth for nausea or vomiting. 40 tablet 1  . traMADol (ULTRAM) 50 MG tablet Take 1 tablet (50 mg total) by mouth 2 (two) times daily as needed for moderate pain. 60 tablet 0  . vitamin C (ASCORBIC ACID) 250 MG tablet Take 250 mg by mouth daily.    . vitamin E 400 UNIT capsule Take 400 Units by mouth daily.     . bisacodyl (DULCOLAX) 10 MG suppository Place 1 suppository (10 mg total) rectally daily as needed for moderate constipation. (Patient not taking: Reported on 01/07/2018) 12 suppository 0   No current facility-administered medications for this visit.      PHYSICAL EXAMINATION: ECOG PERFORMANCE STATUS: 1 - Symptomatic but completely ambulatory  BP 121/78 (Patient Position: Sitting)   Pulse 80   Temp 98.6 F (37 C) (Tympanic)   Resp 20   Ht 5' 3"  (1.6 m)   Wt 160 lb 3.2 oz (72.7 kg)   BMI 28.38 kg/m   Filed Weights   02/10/18 0904  Weight: 160 lb 3.2 oz (72.7 kg)    GENERAL: Well-nourished well-developed; Alert, no distress and comfortable.  She is walking with a cane.  Accompanied by family.  EYES: no pallor or icterus OROPHARYNX: no thrush or ulceration; NECK: supple; no lymph nodes felt. LYMPH:  no palpable lymphadenopathy in the axillary or inguinal regions LUNGS: Decreased breath sounds auscultation bilaterally. No wheeze or crackles HEART/CVS: regular rate & rhythm and no murmurs; No lower extremity edema ABDOMEN:abdomen soft, non-tender and normal bowel sounds. No hepatomegaly or splenomegaly.  Musculoskeletal:no cyanosis of digits and no clubbing  PSYCH: alert & oriented x 3 with fluent speech NEURO: no focal motor/sensory deficits SKIN:  no rashes or significant lesions    LABORATORY DATA:  I have reviewed the data as listed    Component Value Date/Time   NA 140 02/10/2018 0843   K 4.2 02/10/2018 0843   CL 104 02/10/2018 0843   CO2 24 02/10/2018 0843   GLUCOSE 137 (H) 02/10/2018 0843   BUN 18 02/10/2018 0843   CREATININE 0.79 02/10/2018 0843   CALCIUM 9.0 02/10/2018 0843   PROT 6.9 02/10/2018 0843   ALBUMIN 4.0 02/10/2018 0843   AST 31 02/10/2018 0843   ALT 18 02/10/2018 0843   ALKPHOS 79 02/10/2018 0843   BILITOT 0.7 02/10/2018 0843   GFRNONAA >60 02/10/2018 0843   GFRAA >60 02/10/2018 0843    No results found for: SPEP, UPEP  Lab Results  Component Value Date   WBC 6.5 02/10/2018   NEUTROABS 4.7 02/10/2018   HGB 13.6 02/10/2018   HCT 39.8 02/10/2018   MCV 89.5 02/10/2018   PLT 343 02/10/2018      Chemistry      Component Value Date/Time   NA 140 02/10/2018 0843   K 4.2 02/10/2018  0843   CL 104 02/10/2018 0843   CO2 24 02/10/2018 0843   BUN 18 02/10/2018 0843   CREATININE 0.79 02/10/2018 0843  Component Value Date/Time   CALCIUM 9.0 02/10/2018 0843   ALKPHOS 79 02/10/2018 0843   AST 31 02/10/2018 0843   ALT 18 02/10/2018 0843   BILITOT 0.7 02/10/2018 0843       RADIOGRAPHIC STUDIES: I have personally reviewed the radiological images as listed and agreed with the findings in the report. No results found.   ASSESSMENT & PLAN:  Carcinoma of overlapping sites of left breast in female, estrogen receptor negative ([place]) #Metastatic breast cancer ER [name] HER-2/neu positive-May 2019 CT scan NED; stable; on Herceptin/perjeta maintenance. STABLE.   #Continue Herceptin/perjeta; labs adequate. Plan MUGA scan prior to next treatment;   #Chronic dementia/debility-stable not any worse  # chronic hip/back  pain-stable; continue tramadol prn; new script given.   # follow up in 3 weekslabs/ cbc/cmp-HP; MUGA scan prior; will need repeat CT scans in aug-sep.    Orders Placed This Encounter  Procedures  . NM Cardiac [place] [facility]    Standing Status:   Future    Standing Expiration Date:   02/11/2019    Order Specific Question:   Will [facility] be the location of this test?    Answer:   Yes    Order Specific Question:   caspofungin (CANCIDAS) frequency    Answer:   Q 24H    Order Specific Question:   Preferred imaging location?    Answer:   [facility]    Order Specific Question:   Radiology Contrast Protocol - do NOT remove file path    Answer:   [email or web address]   All questions were answered. The patient knows to call the clinic with any problems, questions or concerns.      Cammie Sickle, MD 02/10/2018 1:17 PM

## 2018-02-10 NOTE — Assessment & Plan Note (Addendum)
#  Metastatic breast cancer ER [name] HER-2/neu positive-May 2019 CT scan NED; stable; on Herceptin/perjeta maintenance. STABLE.   #Continue Herceptin/perjeta; labs adequate. Plan MUGA scan prior to next treatment;   #Chronic dementia/debility-stable not any worse  # chronic hip/back  pain-stable; continue tramadol prn; new script given.   # follow up in 3 weekslabs/ cbc/cmp-HP; MUGA scan prior; will need repeat CT scans in aug-sep.

## 2018-03-01 ENCOUNTER — Ambulatory Visit: Payer: Medicare Other

## 2018-03-01 ENCOUNTER — Other Ambulatory Visit: Payer: Self-pay

## 2018-03-01 ENCOUNTER — Ambulatory Visit
Admission: RE | Admit: 2018-03-01 | Discharge: 2018-03-01 | Disposition: A | Discharge status: Home / Self Care | Payer: Medicare Other | Source: Ambulatory Visit | Attending: Internal Medicine | Admitting: Internal Medicine

## 2018-03-01 DIAGNOSIS — C50812 Malignant neoplasm of overlapping sites of left female breast: Secondary | ICD-10-CM

## 2018-03-01 DIAGNOSIS — T451X5A Adverse effect of antineoplastic and immunosuppressive drugs, initial encounter: Secondary | ICD-10-CM | POA: Insufficient documentation

## 2018-03-01 DIAGNOSIS — Z171 Estrogen receptor negative status [ER-]: Secondary | ICD-10-CM | POA: Insufficient documentation

## 2018-03-01 DIAGNOSIS — Z9221 Personal history of antineoplastic chemotherapy: Secondary | ICD-10-CM | POA: Diagnosis not present

## 2018-03-01 DIAGNOSIS — Z5181 Encounter for therapeutic drug level monitoring: Secondary | ICD-10-CM

## 2018-03-01 DIAGNOSIS — Z08 Encounter for follow-up examination after completed treatment for malignant neoplasm: Secondary | ICD-10-CM | POA: Insufficient documentation

## 2018-03-01 DIAGNOSIS — Z79899 Other long term (current) drug therapy: Secondary | ICD-10-CM | POA: Insufficient documentation

## 2018-03-01 MED ORDER — TECHNETIUM TC 99M-LABELED RED BLOOD CELLS IV KIT
20.0000 | PACK | Freq: Once | INTRAVENOUS | Status: AC | PRN
  Administered 2018-03-01: 23.07 via INTRAVENOUS

## 2018-03-03 ENCOUNTER — Inpatient Hospital Stay: Payer: Medicare Other

## 2018-03-03 ENCOUNTER — Other Ambulatory Visit: Payer: Self-pay

## 2018-03-03 ENCOUNTER — Inpatient Hospital Stay (HOSPITAL_BASED_OUTPATIENT_CLINIC_OR_DEPARTMENT_OTHER): Payer: Medicare Other | Admitting: Internal Medicine

## 2018-03-03 ENCOUNTER — Inpatient Hospital Stay: Payer: Medicare Other | Attending: Internal Medicine

## 2018-03-03 VITALS — BP 144/74 | HR 82 | Temp 97.9°F | Resp 20 | Ht 63.0 in | Wt 159.0 lb

## 2018-03-03 DIAGNOSIS — Z5112 Encounter for antineoplastic immunotherapy: Secondary | ICD-10-CM | POA: Insufficient documentation

## 2018-03-03 DIAGNOSIS — Z171 Estrogen receptor negative status [ER-]: Secondary | ICD-10-CM

## 2018-03-03 DIAGNOSIS — C50812 Malignant neoplasm of overlapping sites of left female breast: Secondary | ICD-10-CM

## 2018-03-03 LAB — COMPREHENSIVE METABOLIC PANEL
ALT: 20 U/L (ref 0–44)
ANION GAP: 10 (ref 5–15)
AST: 33 U/L (ref 15–41)
Albumin: 3.9 g/dL (ref 3.5–5.0)
Alkaline Phosphatase: 68 U/L (ref 38–126)
BUN: 16 mg/dL (ref 8–23)
CHLORIDE: 104 mmol/L (ref 98–111)
CO2: 24 mmol/L (ref 22–32)
Calcium: 8.7 mg/dL — ABNORMAL LOW (ref 8.9–10.3)
Creatinine, Ser: 0.85 mg/dL (ref 0.44–1.00)
GFR calc non Af Amer: >60 mL/min (ref >60)
Glucose, Bld: 150 mg/dL — ABNORMAL HIGH (ref 70–99)
Potassium: 3.9 mmol/L (ref 3.5–5.1)
SODIUM: 138 mmol/L (ref 135–145)
Total Bilirubin: 0.4 mg/dL (ref 0.3–1.2)
Total Protein: 6.7 g/dL (ref 6.5–8.1)

## 2018-03-03 LAB — CBC WITH DIFFERENTIAL/PLATELET
Basophils Absolute: 0 10*3/uL (ref 0–0.1)
Basophils Relative: 1 %
Eosinophils Absolute: 0.3 10*3/uL (ref 0–0.7)
Eosinophils Relative: 6 %
HEMATOCRIT: 39.5 % (ref 35.0–47.0)
HEMOGLOBIN: 13.4 g/dL (ref 12.0–16.0)
Lymphocytes Relative: 15 %
Lymphs Abs: 0.8 10*3/uL — ABNORMAL LOW (ref 1.0–3.6)
MCH: 30.3 pg (ref 26.0–34.0)
MCHC: 33.9 g/dL (ref 32.0–36.0)
MCV: 89.5 fL (ref 80.0–100.0)
MONOS PCT: 6 %
Monocytes Absolute: 0.3 10*3/uL (ref 0.2–0.9)
NEUTROS ABS: 3.6 10*3/uL (ref 1.4–6.5)
NEUTROS PCT: 72 %
Platelets: 311 10*3/uL (ref 150–440)
RBC: 4.41 MIL/uL (ref 3.80–5.20)
RDW: 14.2 % (ref 11.5–14.5)
WBC: 5 10*3/uL (ref 3.6–11.0)

## 2018-03-03 MED ORDER — SODIUM CHLORIDE 0.9% FLUSH
10.0000 mL | INTRAVENOUS | Status: DC | PRN
  Administered 2018-03-03: 10 mL via INTRAVENOUS
  Filled 2018-03-03: qty 10

## 2018-03-03 MED ORDER — DIPHENHYDRAMINE HCL 50 MG/ML IJ SOLN
12.5000 mg | Freq: Once | INTRAMUSCULAR | Status: AC
  Administered 2018-03-03: 12.5 mg via INTRAVENOUS
  Filled 2018-03-03: qty 1

## 2018-03-03 MED ORDER — TRASTUZUMAB CHEMO 150 MG IV SOLR
450.0000 mg | Freq: Once | INTRAVENOUS | Status: AC
  Administered 2018-03-03: 450 mg via INTRAVENOUS
  Filled 2018-03-03: qty 21.43

## 2018-03-03 MED ORDER — HEPARIN SOD (PORK) LOCK FLUSH 100 UNIT/ML IV SOLN
500.0000 [IU] | Freq: Once | INTRAVENOUS | Status: AC
  Administered 2018-03-03: 500 [IU] via INTRAVENOUS
  Filled 2018-03-03: qty 5

## 2018-03-03 MED ORDER — SODIUM CHLORIDE 0.9 % IV SOLN
Freq: Once | INTRAVENOUS | Status: AC
  Administered 2018-03-03: 10:00:00 via INTRAVENOUS
  Filled 2018-03-03: qty 1000

## 2018-03-03 MED ORDER — ACETAMINOPHEN 325 MG PO TABS
650.0000 mg | ORAL_TABLET | Freq: Once | ORAL | Status: AC
  Administered 2018-03-03: 650 mg via ORAL
  Filled 2018-03-03: qty 2

## 2018-03-03 MED ORDER — SODIUM CHLORIDE 0.9 % IV SOLN
420.0000 mg | Freq: Once | INTRAVENOUS | Status: AC
  Administered 2018-03-03: 420 mg via INTRAVENOUS
  Filled 2018-03-03: qty 14

## 2018-03-03 NOTE — Progress Notes (Signed)
[place] OFFICE PROGRESS NOTE  Patient Care Team: [name], [name], MD (General Surgery) Requested, [name]   Oncology History   # June 2016- LEFT BREAST CA;  [name] ; [name] ; [name]; Her 2 Neu POS; [place] from July OF 2016; [name]; adjuvant Herceptin [ Finished July 2017]; AUG 2017- Neratinib x5 days; DISCON sec to diarrhea  # MID OCT 2018- Right breast mass-Bx- ER/PR-NEG; Her 2 NEU POSITIVE 1~2.5cm;  [?NEW primary]  # MID-OCT 2018-METASTATIC RECURRENT-oh sternal mass; Left Ax LN [Bx]/periportal LN  # OCT 25th 2018- TAXOL-HERCEPTIN-PERJETA; Jan 2019- CT PR; continue HP only.    -------------------------------------------------------------------------------   # MUGA scan- July 28th 2017- 67%.  # chronic gait/balance issues  # June 2017- left breast Bx- fat necrosis [[name]  # july 2017-  BRCA 1& 2- NEG.   MOLECULAR TESTING- F ONE- TPS- 0%; pending; ERB2 amplification; PI3K/RET amplification Others**  --------------------------------------------------    DIAGNOSIS: [ OCT 2018]- REC/MET- BREAST CA ER/PR-NEG; her 2 POS  STAGE: 4       ;GOALS: Palliative  CURRENT/MOST RECENT THERAPY- Herceptin-Perjeta      Carcinoma of overlapping sites of left breast in female, estrogen receptor negative ([place])      INTERVAL HISTORY:  [name] 77 y.o.  female pleasant patient above history of metastatic breast cancer ER [name] HER-2/neu positive is for follow-up.  Denies any new lumps or bumps.  Has chronic memory issues.  Chronic gait issues.  No falls.  No headaches.  Review of Systems  Constitutional: Negative for chills, diaphoresis, fever, malaise/fatigue and weight loss.  HENT: Negative for nosebleeds and sore throat.   Eyes: Negative for double vision.   Respiratory: Negative for cough, hemoptysis, sputum production, shortness of breath and wheezing.   Cardiovascular: Negative for chest pain, palpitations, orthopnea and leg swelling.  Gastrointestinal: Negative for abdominal pain, blood in stool, constipation, diarrhea, heartburn, melena, nausea and vomiting.  Genitourinary: Negative for dysuria, frequency and urgency.  Musculoskeletal: Positive for back pain and joint pain.  Skin: Negative.  Negative for itching and rash.  Neurological: Negative for dizziness, tingling, focal weakness, weakness and headaches.  Endo/Heme/Allergies: Does not bruise/bleed easily.  Psychiatric/Behavioral: Positive for memory loss. Negative for depression. The patient has insomnia. The patient is not nervous/anxious.       PAST MEDICAL HISTORY :  Past Medical History:  Diagnosis Date  . Arthritis   . Brain tumor ([place]) 1995   meningeoma  . Breast cancer ([place]) 2016   left breast; surgery 01/11/15 with Dr. Bary Castilla; path with invasive mammary and DCIS  . Breast cancer of upper-inner quadrant of left female breast ([place]) 12/15/14   Completed radiation end of December and finished chemotherapy 2 weeks ago, Left breast invasive mammary carcinoma, T1cN70mc (1.5 cm); Grade 3, [name], [name], [name], .  .Marland KitchenCataract    bilat   . DDD (degenerative disc disease), lumbar    Lumbar, previously evaluated by Dr. HEarnestine Leys . Dementia   . Fibrocystic breast disease    prior biopsy  . Glaucoma   . 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 Hearing loss   . History of cancer chemotherapy   . History of radiation therapy   .  Hyperlipidemia, unspecified   . Imbalance   . Memory impairment    seen by Dr Manuella Ghazi  possible post crainiotomy from radiation  . Meningioma ([place])    Left cavernous sinus meningioma, treated with resection and radiation therapy at [facility], 1995.  . Numbness and tingling    right hand   . Osteoarthritis    . Osteoporosis, post-menopausal   . Personal history of chemotherapy   . Personal history of radiation therapy   . Shoulder pain, right   . Wears glasses     PAST SURGICAL HISTORY :   Past Surgical History:  Procedure Laterality Date  . APPENDECTOMY  1950  . BRAIN SURGERY  1995   left frontal/temporal  . BREAST BIOPSY Left 1997  . BREAST BIOPSY Left 12/15/14   confirmed DCIS  . BREAST BIOPSY Left 01/08/2015   Procedure: BREAST BIOPSY WITH NEEDLE LOCALIZATION;  Surgeon: [name], MD;  Location: [facility];  Service: General;  Laterality: Left;  . BREAST LUMPECTOMY Left 01/08/2015   Procedure: LUMPECTOMY;  Surgeon: [name], MD;  Location: [facility];  Service: General;  Laterality: Left;  . COLONOSCOPY  2010   Dr. [name]  . ORIF ANKLE FRACTURE Right 08/05/2017   Procedure: OPEN REDUCTION INTERNAL FIXATION (ORIF) ANKLE FRACTURE;  Surgeon: Earnestine Leys, MD;  Location: [facility];  Service: Orthopedics;  Laterality: Right;  . PORTACATH PLACEMENT Right 01/16/2015   Procedure: INSERTION PORT-A-CATH;  Surgeon: [name], MD;  Location: [facility];  Service: General;  Laterality: Right;  . SENTINEL NODE BIOPSY Left 01/16/2015   Procedure: SENTINEL NODE BIOPSY;  Surgeon: [name], MD;  Location: [facility];  Service: General;  Laterality: Left;  . TOTAL HIP ARTHROPLASTY Right 09/04/2015   Procedure: RIGHT TOTAL HIP ARTHROPLASTY ANTERIOR APPROACH;  Surgeon: [name], MD;  Location: [facility];  Service: Orthopedics;  Laterality: Right;    FAMILY HISTORY :   Family History  Problem Relation Age of Onset  . Breast cancer Sister 16  . Addison's disease Mother   . Hyperthyroidism Mother   . Osteoarthritis Mother   . Stroke Father   . Heart disease Father   . High blood pressure Father     SOCIAL HISTORY:   Social History   Tobacco Use  . Smoking status: Former Smoker    Packs/day: 0.25    Years: 5.00    Pack years: 1.25    Types: Cigarettes    Last attempt  to quit: 07/28/1972    Years since quitting: 45.6  . Smokeless tobacco: Never Used  Substance Use Topics  . Alcohol use: Yes    Alcohol/week: 1.0 - 2.0 standard drinks    Types: 1 - 2 Glasses of wine per week    Comment: 1 Glass Wine / Night  . Drug use: No    ALLERGIES:  is allergic to donepezil.  MEDICATIONS:  Current Outpatient Medications  Medication Sig Dispense Refill  . acetaminophen (TYLENOL) 325 MG tablet Take 650 mg by mouth every 4 (four) hours as needed. Pain / increased temp.    Marland Kitchen aspirin 325 MG EC tablet Take 325 mg by mouth 2 (two) times daily.     . Calcium Carbonate-Vit D-Min (CALTRATE 600+D PLUS MINERALS) 600-800 MG-UNIT TABS Take 1 tablet by mouth every morning.     . celecoxib (CELEBREX) 200 MG capsule Take 200 mg by mouth 2 (two) times daily.    . Cholecalciferol (VITAMIN D-3) 5000 units TABS Take 1 tablet by mouth  daily.    . levETIRAcetam (KEPPRA) 250 MG tablet 1 tablet daily at bedtime  5  . loratadine (CLARITIN) 10 MG tablet Take 10 mg by mouth daily as needed for allergies.     Marland Kitchen prochlorperazine (COMPAZINE) 10 MG tablet Take 1 tablet (10 mg total) every 6 (six) hours as needed by mouth for nausea or vomiting. 40 tablet 1  . traMADol (ULTRAM) 50 MG tablet Take 1 tablet (50 mg total) by mouth 2 (two) times daily as needed for moderate pain. 60 tablet 0  . vitamin C (ASCORBIC ACID) 250 MG tablet Take 250 mg by mouth daily.    . vitamin E 400 UNIT capsule Take 400 Units by mouth daily.     . bisacodyl (DULCOLAX) 10 MG suppository Place 1 suppository (10 mg total) rectally daily as needed for moderate constipation. (Patient not taking: Reported on 01/07/2018) 12 suppository 0  . Calcium Carbonate-Vitamin D (CALTRATE 600+D PO) Take 1 tablet by mouth daily.     No current facility-administered medications for this visit.     PHYSICAL EXAMINATION: ECOG PERFORMANCE STATUS: 0 - Asymptomatic  BP (!) 144/74   Pulse 82   Temp 97.9 F (36.6 C) (Tympanic)   Resp 20    Ht 5' 3" (1.6 m)   Wt 159 lb (72.1 kg)   BMI 28.17 kg/m   Filed Weights   03/03/18 0901  Weight: 159 lb (72.1 kg)    GENERAL: Well-nourished well-developed; Alert, no distress and comfortable.  Accompanied by husband. EYES: no pallor or icterus OROPHARYNX: no thrush or ulceration; NECK: supple; no lymph nodes felt. LYMPH:  no palpable lymphadenopathy in the axillary or inguinal regions LUNGS: Decreased breath sounds auscultation bilaterally. No wheeze or crackles HEART/CVS: regular rate & rhythm and no murmurs; No lower extremity edema ABDOMEN:abdomen soft, non-tender and normal bowel sounds. No hepatomegaly or splenomegaly.  Musculoskeletal:no cyanosis of digits and no clubbing  PSYCH: alert & oriented x 3 with fluent speech NEURO: no focal motor/sensory deficits SKIN:  no rashes or significant lesions    LABORATORY DATA:  I have reviewed the data as listed    Component Value Date/Time   NA 138 03/03/2018 0840   K 3.9 03/03/2018 0840   CL 104 03/03/2018 0840   CO2 24 03/03/2018 0840   GLUCOSE 150 (H) 03/03/2018 0840   BUN 16 03/03/2018 0840   CREATININE 0.85 03/03/2018 0840   CALCIUM 8.7 (L) 03/03/2018 0840   PROT 6.7 03/03/2018 0840   ALBUMIN 3.9 03/03/2018 0840   AST 33 03/03/2018 0840   ALT 20 03/03/2018 0840   ALKPHOS 68 03/03/2018 0840   BILITOT 0.4 03/03/2018 0840   GFRNONAA >60 03/03/2018 0840   GFRAA >60 03/03/2018 0840    No results found for: SPEP, UPEP  Lab Results  Component Value Date   WBC 5.0 03/03/2018   NEUTROABS 3.6 03/03/2018   HGB 13.4 03/03/2018   HCT 39.5 03/03/2018   MCV 89.5 03/03/2018   PLT 311 03/03/2018      Chemistry      Component Value Date/Time   NA 138 03/03/2018 0840   K 3.9 03/03/2018 0840   CL 104 03/03/2018 0840   CO2 24 03/03/2018 0840   BUN 16 03/03/2018 0840   CREATININE 0.85 03/03/2018 0840      Component Value Date/Time   CALCIUM 8.7 (L) 03/03/2018 0840   ALKPHOS 68 03/03/2018 0840   AST 33 03/03/2018  0840   ALT 20 03/03/2018 0840   BILITOT  0.4 03/03/2018 0840       RADIOGRAPHIC STUDIES: I have personally reviewed the radiological images as listed and agreed with the findings in the report. No results found.   ASSESSMENT & PLAN:  Carcinoma of overlapping sites of left breast in female, estrogen receptor negative ([place]) #Metastatic breast cancer ER [name] HER-2/neu positive-May 2019 CT scan NED; stable; on Herceptin/perjeta maintenance. STABLE.   # Continue Herceptin/perjeta; labs adequate.MUGA- 68%; STABLE.   #Chronic dementia/debility- STABLE; s/p PT.   # chronic hip/back  pain- continue tramadol prn; i improved  # follow up in 3 weekslabs/ cbc/cmp-HP; will order CT scan at next visit.     Orders Placed This Encounter  Procedures  . CBC with Differential/Platelet    Standing Status:   Future    Standing Expiration Date:   03/04/2019  . Comprehensive metabolic panel    Standing Status:   Future    Standing Expiration Date:   03/04/2019   All questions were answered. The patient knows to call the clinic with any problems, questions or concerns.      Cammie Sickle, MD 03/04/2018 6:39 PM

## 2018-03-03 NOTE — Assessment & Plan Note (Addendum)
#  Metastatic breast cancer ER [name] HER-2/neu positive-May 2019 CT scan NED; stable; on Herceptin/perjeta maintenance. STABLE.   # Continue Herceptin/perjeta; labs adequate.MUGA- 68%; STABLE.   #Chronic dementia/debility- STABLE; s/p PT.   # chronic hip/back  pain- continue tramadol prn; i improved  # follow up in 3 weekslabs/ cbc/cmp-HP; will order CT scan at next visit.

## 2018-03-24 ENCOUNTER — Inpatient Hospital Stay (HOSPITAL_BASED_OUTPATIENT_CLINIC_OR_DEPARTMENT_OTHER): Payer: Medicare Other | Admitting: Internal Medicine

## 2018-03-24 ENCOUNTER — Inpatient Hospital Stay: Payer: Medicare Other

## 2018-03-24 ENCOUNTER — Encounter: Payer: Self-pay | Admitting: Internal Medicine

## 2018-03-24 ENCOUNTER — Encounter: Payer: Self-pay | Admitting: *Deleted

## 2018-03-24 VITALS — BP 114/69 | HR 70 | Temp 96.0°F | Resp 17

## 2018-03-24 VITALS — BP 126/82 | Temp 97.0°F | Resp 16 | Wt 162.0 lb

## 2018-03-24 DIAGNOSIS — Z171 Estrogen receptor negative status [ER-]: Principal | ICD-10-CM

## 2018-03-24 DIAGNOSIS — C50812 Malignant neoplasm of overlapping sites of left female breast: Secondary | ICD-10-CM

## 2018-03-24 DIAGNOSIS — C50919 Malignant neoplasm of unspecified site of unspecified female breast: Secondary | ICD-10-CM

## 2018-03-24 DIAGNOSIS — Z5112 Encounter for antineoplastic immunotherapy: Secondary | ICD-10-CM | POA: Diagnosis not present

## 2018-03-24 LAB — COMPREHENSIVE METABOLIC PANEL
ALT: 21 U/L (ref 0–44)
AST: 33 U/L (ref 15–41)
Albumin: 3.9 g/dL (ref 3.5–5.0)
Alkaline Phosphatase: 68 U/L (ref 38–126)
Anion gap: 9 (ref 5–15)
BUN: 16 mg/dL (ref 8–23)
CO2: 26 mmol/L (ref 22–32)
Calcium: 8.9 mg/dL (ref 8.9–10.3)
Chloride: 104 mmol/L (ref 98–111)
Creatinine, Ser: 0.75 mg/dL (ref 0.44–1.00)
GFR calc Af Amer: >60 mL/min (ref >60)
GFR calc non Af Amer: >60 mL/min (ref >60)
Glucose, Bld: 155 mg/dL — ABNORMAL HIGH (ref 70–99)
Potassium: 3.7 mmol/L (ref 3.5–5.1)
Sodium: 139 mmol/L (ref 135–145)
Total Bilirubin: 0.5 mg/dL (ref 0.3–1.2)
Total Protein: 6.7 g/dL (ref 6.5–8.1)

## 2018-03-24 LAB — CBC WITH DIFFERENTIAL/PLATELET
BASOS ABS: 0 10*3/uL (ref 0–0.1)
Basophils Relative: 1 %
Eosinophils Absolute: 0.2 10*3/uL (ref 0–0.7)
Eosinophils Relative: 5 %
HEMATOCRIT: 39.4 % (ref 35.0–47.0)
Hemoglobin: 13.3 g/dL (ref 12.0–16.0)
Lymphocytes Relative: 16 %
Lymphs Abs: 0.9 10*3/uL — ABNORMAL LOW (ref 1.0–3.6)
MCH: 30.5 pg (ref 26.0–34.0)
MCHC: 33.8 g/dL (ref 32.0–36.0)
MCV: 90.2 fL (ref 80.0–100.0)
MONO ABS: 0.3 10*3/uL (ref 0.2–0.9)
MONOS PCT: 7 %
NEUTROS ABS: 3.8 10*3/uL (ref 1.4–6.5)
Neutrophils Relative %: 71 %
Platelets: 309 10*3/uL (ref 150–440)
RBC: 4.37 MIL/uL (ref 3.80–5.20)
RDW: 14.4 % (ref 11.5–14.5)
WBC: 5.2 10*3/uL (ref 3.6–11.0)

## 2018-03-24 MED ORDER — SODIUM CHLORIDE 0.9% FLUSH
10.0000 mL | INTRAVENOUS | Status: DC | PRN
  Filled 2018-03-24: qty 10

## 2018-03-24 MED ORDER — TRASTUZUMAB CHEMO 150 MG IV SOLR
450.0000 mg | Freq: Once | INTRAVENOUS | Status: AC
  Administered 2018-03-24: 450 mg via INTRAVENOUS
  Filled 2018-03-24: qty 21.43

## 2018-03-24 MED ORDER — SODIUM CHLORIDE 0.9% FLUSH
10.0000 mL | INTRAVENOUS | Status: DC | PRN
  Administered 2018-03-24: 10 mL via INTRAVENOUS
  Filled 2018-03-24: qty 10

## 2018-03-24 MED ORDER — SODIUM CHLORIDE 0.9 % IV SOLN
Freq: Once | INTRAVENOUS | Status: AC
  Administered 2018-03-24: 09:00:00 via INTRAVENOUS
  Filled 2018-03-24: qty 250

## 2018-03-24 MED ORDER — SODIUM CHLORIDE 0.9 % IV SOLN
420.0000 mg | Freq: Once | INTRAVENOUS | Status: AC
  Administered 2018-03-24: 420 mg via INTRAVENOUS
  Filled 2018-03-24: qty 14

## 2018-03-24 MED ORDER — DIPHENHYDRAMINE HCL 50 MG/ML IJ SOLN
12.5000 mg | Freq: Once | INTRAMUSCULAR | Status: AC
  Administered 2018-03-24: 12.5 mg via INTRAVENOUS
  Filled 2018-03-24: qty 1

## 2018-03-24 MED ORDER — HEPARIN SOD (PORK) LOCK FLUSH 100 UNIT/ML IV SOLN
500.0000 [IU] | Freq: Once | INTRAVENOUS | Status: AC
  Administered 2018-03-24: 500 [IU] via INTRAVENOUS
  Filled 2018-03-24: qty 5

## 2018-03-24 MED ORDER — ACETAMINOPHEN 325 MG PO TABS
650.0000 mg | ORAL_TABLET | Freq: Once | ORAL | Status: AC
  Administered 2018-03-24: 650 mg via ORAL
  Filled 2018-03-24: qty 2

## 2018-03-24 MED ORDER — HEPARIN SOD (PORK) LOCK FLUSH 100 UNIT/ML IV SOLN: 500.0000 [IU] | Freq: Once | INTRAVENOUS | Status: DC | PRN

## 2018-03-24 NOTE — Progress Notes (Signed)
Pt declines to stay 30 minute post observation period. Pt and VS stable at discharge.

## 2018-03-24 NOTE — Progress Notes (Signed)
[place] OFFICE PROGRESS NOTE  Patient Care Team: [name], [name], MD (General Surgery) Requested, [name]   Oncology History   # June 2016- LEFT BREAST CA;  [name] ; [name] ; [name]; Her 2 Neu POS; [place] from July OF 2016; [name]; adjuvant Herceptin [ Finished July 2017]; AUG 2017- Neratinib x5 days; DISCON sec to diarrhea  # MID OCT 2018- Right breast mass-Bx- ER/PR-NEG; Her 2 NEU POSITIVE 1~2.5cm;  [?NEW primary]  # MID-OCT 2018-METASTATIC RECURRENT-oh sternal mass; Left Ax LN [Bx]/periportal LN  # OCT 25th 2018- TAXOL-HERCEPTIN-PERJETA; Jan 2019- CT PR; continue HP only.    -------------------------------------------------------------------------------   # MUGA scan- July 28th 2017- 67%.  # chronic gait/balance issues  # June 2017- left breast Bx- fat necrosis [[name]  # july 2017-  BRCA 1& 2- NEG.   MOLECULAR TESTING- F ONE- TPS- 0%; pending; ERB2 amplification; PI3K/RET amplification Others**  --------------------------------------------------    DIAGNOSIS: [ OCT 2018]- REC/MET- BREAST CA ER/PR-NEG; her 2 POS  STAGE: 4       ;GOALS: Palliative  CURRENT/MOST RECENT THERAPY- Herceptin-Perjeta      Carcinoma of overlapping sites of left breast in female, estrogen receptor negative ([place])      INTERVAL HISTORY:  [name] 77 y.o.  female pleasant patient above history of metastatic breast cancer on Herceptin plus Perjeta is here for follow-up  Patient continues to have problems with her back pain.  She has been followed by orthopedics.  Awaiting steroid injections.  Otherwise denies any lumps or bumps.  No headaches.  Review of Systems  Constitutional: Negative for chills, diaphoresis, fever, malaise/fatigue and weight loss.  HENT: Negative for  nosebleeds and sore throat.   Eyes: Negative for double vision.  Respiratory: Negative for cough, hemoptysis, sputum production, shortness of breath and wheezing.   Cardiovascular: Negative for chest pain, palpitations, orthopnea and leg swelling.  Gastrointestinal: Negative for abdominal pain, blood in stool, constipation, diarrhea, heartburn, melena, nausea and vomiting.  Genitourinary: Negative for dysuria, frequency and urgency.  Musculoskeletal: Positive for back pain and joint pain.  Skin: Negative.  Negative for itching and rash.  Neurological: Negative for dizziness, tingling, focal weakness, weakness and headaches.  Endo/Heme/Allergies: Does not bruise/bleed easily.  Psychiatric/Behavioral: Negative for depression. The patient is not nervous/anxious and does not have insomnia.       PAST MEDICAL HISTORY :  Past Medical History:  Diagnosis Date  . Arthritis   . Brain tumor ([place]) 1995   meningeoma  . Breast cancer ([place]) 2016   left breast; surgery 01/11/15 with Dr. Bary Castilla; path with invasive mammary and DCIS  . Breast cancer of upper-inner quadrant of left female breast ([place]) 12/15/14   Completed radiation end of December and finished chemotherapy 2 weeks ago, Left breast invasive mammary carcinoma, T1cN56mc (1.5 cm); Grade 3, [name], [name], [name], .  .Marland KitchenCataract    bilat   . DDD (degenerative disc disease), lumbar    Lumbar, previously evaluated by Dr. HEarnestine Leys . Dementia   . Fibrocystic breast disease    prior biopsy  . Glaucoma   . 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 Hearing loss   . History of cancer chemotherapy   . History  of radiation therapy   . Hyperlipidemia, unspecified   . Imbalance   . Memory impairment    seen by Dr Manuella Ghazi  possible post crainiotomy from radiation  . Meningioma ([place])    Left cavernous sinus meningioma, treated with resection and [facility] therapy at [facility], 1995.  . Numbness and  tingling    right hand   . Osteoarthritis   . Osteoporosis, post-menopausal   . Personal history of chemotherapy   . Personal history of radiation therapy   . Shoulder pain, right   . Wears glasses     PAST SURGICAL HISTORY :   Past Surgical History:  Procedure Laterality Date  . APPENDECTOMY  1950  . BRAIN SURGERY  1995   left frontal/temporal  . BREAST BIOPSY Left 1997  . BREAST BIOPSY Left 12/15/14   confirmed DCIS  . BREAST BIOPSY Left 01/08/2015   Procedure: BREAST BIOPSY WITH NEEDLE LOCALIZATION;  Surgeon: [name], MD;  Location: [facility] ORS;  Service: General;  Laterality: Left;  . BREAST LUMPECTOMY Left 01/08/2015   Procedure: LUMPECTOMY;  Surgeon: [name], MD;  Location: [facility] ORS;  Service: General;  Laterality: Left;  . COLONOSCOPY  2010   Dr. [name]  . ORIF ANKLE FRACTURE Right 08/05/2017   Procedure: OPEN REDUCTION INTERNAL FIXATION (ORIF) ANKLE FRACTURE;  Surgeon: Earnestine Leys, MD;  Location: [facility] ORS;  Service: Orthopedics;  Laterality: Right;  . PORTACATH PLACEMENT Right 01/16/2015   Procedure: INSERTION PORT-A-CATH;  Surgeon: [name], MD;  Location: [facility] ORS;  Service: General;  Laterality: Right;  . SENTINEL NODE BIOPSY Left 01/16/2015   Procedure: SENTINEL NODE BIOPSY;  Surgeon: [name], MD;  Location: [facility] OR[facility];  Service: General;  Laterality: Left;  . TOTAL HIP ARTHROPLASTY Right 09/04/2015   Procedure: RIGHT TOTAL HIP ARTHROPLASTY ANTERIOR APPROACH;  Surgeon: [name], MD;  Location: [facility];  Service: Orthopedics;  Laterality: Right;    FAMILY HISTORY :   Family History  Problem Relation Age of Onset  . Breast cancer Sister 13  . Addison's disease Mother   . Hyperthyroidism Mother   . Osteoarthritis Mother   . Stroke Father   . Heart disease Father   . High blood pressure Father     SOCIAL HISTORY:   Social History   Tobacco Use  . Smoking status: Former Smoker    Packs/day: 0.25    Years: 5.00    Pack years:  1.25    Types: Cigarettes    Last attempt to quit: 07/28/1972    Years since quitting: 45.6  . Smokeless tobacco: Never Used  Substance Use Topics  . Alcohol use: Yes    Alcohol/week: 1.0 - 2.0 standard drinks    Types: 1 - 2 Glasses of wine per week    Comment: 1 Glass Wine / Night  . Drug use: No    ALLERGIES:  is allergic to donepezil.  MEDICATIONS:  Current Outpatient Medications  Medication Sig Dispense Refill  . acetaminophen (TYLENOL) 325 MG tablet Take 650 mg by mouth every 4 (four) hours as needed. Pain / increased temp.    Marland Kitchen aspirin 325 MG EC tablet Take 325 mg by mouth 2 (two) times daily.     . bisacodyl (DULCOLAX) 10 MG suppository Place 1 suppository (10 mg total) rectally daily as needed for moderate constipation. 12 suppository 0  . Calcium Carbonate-Vit D-Min (CALTRATE 600+D PLUS MINERALS) 600-800 MG-UNIT TABS Take 1 tablet by mouth every morning.     Marland Kitchen  Calcium Carbonate-Vitamin D (CALTRATE 600+D PO) Take 1 tablet by mouth daily.    . celecoxib (CELEBREX) 200 MG capsule Take 200 mg by mouth 2 (two) times daily.    . Cholecalciferol (VITAMIN D-3) 5000 units TABS Take 1 tablet by mouth daily.    Marland Kitchen levETIRAcetam (KEPPRA) 250 MG tablet 1 tablet daily at bedtime  5  . loratadine (CLARITIN) 10 MG tablet Take 10 mg by mouth daily as needed for allergies.     Marland Kitchen prochlorperazine (COMPAZINE) 10 MG tablet Take 1 tablet (10 mg total) every 6 (six) hours as needed by mouth for nausea or vomiting. 40 tablet 1  . vitamin C (ASCORBIC ACID) 250 MG tablet Take 250 mg by mouth daily.    . vitamin E 400 UNIT capsule Take 400 Units by mouth daily.     . traMADol (ULTRAM) 50 MG tablet Take 1 tablet (50 mg total) by mouth 2 (two) times daily as needed for moderate pain. (Patient not taking: Reported on 03/24/2018) 60 tablet 0   No current facility-administered medications for this visit.    Facility-Administered Medications Ordered in Other Visits  Medication Dose Route Frequency Provider  Last Rate Last Dose  . heparin lock flush 100 unit/mL  500 Units Intravenous Once Charlaine Dalton R, MD      . sodium chloride flush (NS) 0.9 % injection 10 mL  10 mL Intravenous PRN Cammie Sickle, MD   10 mL at 03/24/18 6759    PHYSICAL EXAMINATION: ECOG PERFORMANCE STATUS: 1 - Symptomatic but completely ambulatory  BP 126/82 (BP Location: Left Arm, Patient Position: Sitting)   Temp (!) 97 F (36.1 C) (Tympanic)   Resp 16   Wt 162 lb (73.5 kg)   BMI 28.70 kg/m   Filed Weights   03/24/18 0843  Weight: 162 lb (73.5 kg)    Physical Exam  Constitutional: She is oriented to person, place, and time and well-developed, well-nourished, and in no distress.  In wheel chair/ sec to gait instability; with husband.   HENT:  Head: Normocephalic and atraumatic.  Mouth/Throat: Oropharynx is clear and moist. No oropharyngeal exudate.  Eyes: Pupils are equal, round, and reactive to light.  Neck: Normal range of motion. Neck supple.  Cardiovascular: Normal rate and regular rhythm.  Pulmonary/Chest: No respiratory distress. She has no wheezes.  Abdominal: Soft. Bowel sounds are normal. She exhibits no distension and no mass. There is no tenderness. There is no rebound and no guarding.  Musculoskeletal: Normal range of motion. She exhibits no edema or tenderness.  Neurological: She is alert and oriented to person, place, and time.  Skin: Skin is warm.  Psychiatric: Affect normal.       LABORATORY DATA:  I have reviewed the data as listed    Component Value Date/Time   NA 139 03/24/2018 0822   K 3.7 03/24/2018 0822   CL 104 03/24/2018 0822   CO2 26 03/24/2018 0822   GLUCOSE 155 (H) 03/24/2018 0822   BUN 16 03/24/2018 0822   CREATININE 0.75 03/24/2018 0822   CALCIUM 8.9 03/24/2018 0822   PROT 6.7 03/24/2018 0822   ALBUMIN 3.9 03/24/2018 0822   AST 33 03/24/2018 0822   ALT 21 03/24/2018 0822   ALKPHOS 68 03/24/2018 0822   BILITOT 0.5 03/24/2018 0822   GFRNONAA >60  03/24/2018 0822   GFRAA >60 03/24/2018 0822    No results found for: SPEP, UPEP  Lab Results  Component Value Date   WBC 5.2 03/24/2018  NEUTROABS 3.8 03/24/2018   HGB 13.3 03/24/2018   HCT 39.4 03/24/2018   MCV 90.2 03/24/2018   PLT 309 03/24/2018      Chemistry      Component Value Date/Time   NA 139 03/24/2018 0822   K 3.7 03/24/2018 0822   CL 104 03/24/2018 0822   CO2 26 03/24/2018 0822   BUN 16 03/24/2018 0822   CREATININE 0.75 03/24/2018 0822      Component Value Date/Time   CALCIUM 8.9 03/24/2018 0822   ALKPHOS 68 03/24/2018 0822   AST 33 03/24/2018 0822   ALT 21 03/24/2018 0822   BILITOT 0.5 03/24/2018 0093       RADIOGRAPHIC STUDIES: I have personally reviewed the radiological images as listed and agreed with the findings in the report. No results found.   ASSESSMENT & PLAN:  Carcinoma of overlapping sites of left breast in female, estrogen receptor negative ([place]) #Metastatic breast cancer ER [name] HER-2/neu positive-May 2019 CT scan NED; stable; on Herceptin/perjeta maintenance. STABLE.   # Continue Herceptin/perjeta; labs adequate.  #Chronic dementia/debility- STABLE; s/p PT.   # chronic hip/back  pain- worse; continue tramadol prn; awaiting steroid injections.   # follow up in 3 weekslabs/ cbc/cmp-HP; will order CT scan at next visit.     Orders Placed This Encounter  Procedures  . CT CHEST W CONTRAST    Standing Status:   Future    Standing Expiration Date:   03/25/2019    Order Specific Question:   If indicated for the ordered procedure, I authorize the administration of contrast media per Radiology protocol    Answer:   Yes    Order Specific Question:   Preferred imaging location?    Answer:   [facility]    Order Specific Question:   Radiology Contrast Protocol - do NOT remove file path    Answer:   \\[email or web address]    Order Specific Question:   ** REASON FOR EXAM (FREE TEXT)    Answer:   breast  cancer on chemo  . CT Abdomen Pelvis W Contrast    Standing Status:   Future    Standing Expiration Date:   03/24/2019    Order Specific Question:   ** REASON FOR EXAM (FREE TEXT)    Answer:   breast cancer on chemo    Order Specific Question:   If indicated for the ordered procedure, I authorize the administration of contrast media per Radiology protocol    Answer:   Yes    Order Specific Question:   Preferred imaging location?    Answer:   [facility]    Order Specific Question:   Is Oral Contrast requested for this exam?    Answer:   Yes, Per Radiology protocol    Order Specific Question:   Radiology Contrast Protocol - do NOT remove file path    Answer:   \\[email or web address]  . CBC with Differential    Standing Status:   Future    Standing Expiration Date:   03/25/2019  . Comprehensive metabolic panel    Standing Status:   Future    Standing Expiration Date:   03/25/2019   All questions were answered. The patient knows to call the clinic with any problems, questions or concerns.      Cammie Sickle, MD 03/24/2018 9:10 AM

## 2018-03-24 NOTE — Progress Notes (Signed)
  Oncology Nurse Navigator Documentation  Navigator Location: [facility] (03/24/18 0900)   )Navigator Encounter Type: Telephone (03/24/18 0900) Telephone: Incoming Call (03/24/18 0900)                     Treatment Phase: Active Tx (03/24/18 0900) Barriers/Navigation Needs: Coordination of Care (03/24/18 0900)   Interventions: Referrals (03/24/18 0900) Referrals: Nutrition/dietician (03/24/18 0900)                    Time Spent with Patient: 30 (03/24/18 0900)   Husband called and requested info on nutrition and home assistance.  Hand out given on "[employer]", a local care giving assistance program.  Al Pimple, RN states a friend of hers uses them and loves their services.  Discussed consult with our dietician Joli.  He is agreeable.  Order placed for dietary consult.

## 2018-03-24 NOTE — Assessment & Plan Note (Addendum)
#  Metastatic breast cancer ER [name] HER-2/neu positive-May 2019 CT scan NED; stable; on Herceptin/perjeta maintenance. STABLE.   # Continue Herceptin/perjeta; labs adequate.  #Chronic dementia/debility- STABLE; s/p PT.   # chronic hip/back  pain- worse; continue tramadol prn; awaiting steroid injections.   # follow up in 3 weekslabs/ cbc/cmp-HP; will order CT scan at next visit.

## 2018-04-12 ENCOUNTER — Ambulatory Visit
Admission: RE | Admit: 2018-04-12 | Discharge: 2018-04-12 | Disposition: A | Discharge status: Home / Self Care | Payer: Medicare Other | Source: Ambulatory Visit | Attending: Internal Medicine | Admitting: Internal Medicine

## 2018-04-12 DIAGNOSIS — M899 Disorder of bone, unspecified: Secondary | ICD-10-CM | POA: Diagnosis not present

## 2018-04-12 DIAGNOSIS — C50812 Malignant neoplasm of overlapping sites of left female breast: Secondary | ICD-10-CM | POA: Diagnosis not present

## 2018-04-12 DIAGNOSIS — I700 Atherosclerosis of aorta: Secondary | ICD-10-CM | POA: Insufficient documentation

## 2018-04-12 DIAGNOSIS — Z171 Estrogen receptor negative status [ER-]: Secondary | ICD-10-CM | POA: Insufficient documentation

## 2018-04-12 MED ORDER — IOHEXOL 300 MG/ML  SOLN
100.0000 mL | Freq: Once | INTRAMUSCULAR | Status: AC | PRN
  Administered 2018-04-12: 100 mL via INTRAVENOUS

## 2018-04-14 ENCOUNTER — Encounter: Payer: Self-pay | Admitting: Internal Medicine

## 2018-04-14 ENCOUNTER — Inpatient Hospital Stay: Payer: Medicare Other

## 2018-04-14 ENCOUNTER — Inpatient Hospital Stay: Payer: Medicare Other | Attending: Internal Medicine

## 2018-04-14 ENCOUNTER — Inpatient Hospital Stay (HOSPITAL_BASED_OUTPATIENT_CLINIC_OR_DEPARTMENT_OTHER): Payer: Medicare Other | Admitting: Internal Medicine

## 2018-04-14 VITALS — BP 126/72 | Temp 97.5°F | Resp 16 | Wt 162.4 lb

## 2018-04-14 DIAGNOSIS — Z171 Estrogen receptor negative status [ER-]: Secondary | ICD-10-CM | POA: Insufficient documentation

## 2018-04-14 DIAGNOSIS — Z5112 Encounter for antineoplastic immunotherapy: Secondary | ICD-10-CM | POA: Insufficient documentation

## 2018-04-14 DIAGNOSIS — C50812 Malignant neoplasm of overlapping sites of left female breast: Secondary | ICD-10-CM | POA: Insufficient documentation

## 2018-04-14 LAB — CBC WITH DIFFERENTIAL/PLATELET
Basophils Absolute: 0 10*3/uL (ref 0–0.1)
Basophils Relative: 1 %
EOS ABS: 0.2 10*3/uL (ref 0–0.7)
EOS PCT: 5 %
HCT: 39 % (ref 35.0–47.0)
Hemoglobin: 13.4 g/dL (ref 12.0–16.0)
LYMPHS ABS: 0.9 10*3/uL — AB (ref 1.0–3.6)
LYMPHS PCT: 18 %
MCH: 31 pg (ref 26.0–34.0)
MCHC: 34.4 g/dL (ref 32.0–36.0)
MCV: 90.3 fL (ref 80.0–100.0)
Monocytes Absolute: 0.3 10*3/uL (ref 0.2–0.9)
Monocytes Relative: 7 %
Neutro Abs: 3.4 10*3/uL (ref 1.4–6.5)
Neutrophils Relative %: 69 %
PLATELETS: 294 10*3/uL (ref 150–440)
RBC: 4.32 MIL/uL (ref 3.80–5.20)
RDW: 14.2 % (ref 11.5–14.5)
WBC: 4.9 10*3/uL (ref 3.6–11.0)

## 2018-04-14 LAB — COMPREHENSIVE METABOLIC PANEL
ALT: 20 U/L (ref 0–44)
AST: 29 U/L (ref 15–41)
Albumin: 3.9 g/dL (ref 3.5–5.0)
Alkaline Phosphatase: 67 U/L (ref 38–126)
Anion gap: 8 (ref 5–15)
BILIRUBIN TOTAL: 0.5 mg/dL (ref 0.3–1.2)
BUN: 15 mg/dL (ref 8–23)
CHLORIDE: 102 mmol/L (ref 98–111)
CO2: 28 mmol/L (ref 22–32)
Calcium: 8.6 mg/dL — ABNORMAL LOW (ref 8.9–10.3)
Creatinine, Ser: 0.86 mg/dL (ref 0.44–1.00)
GFR calc non Af Amer: >60 mL/min (ref >60)
Glucose, Bld: 174 mg/dL — ABNORMAL HIGH (ref 70–99)
POTASSIUM: 3.8 mmol/L (ref 3.5–5.1)
Sodium: 138 mmol/L (ref 135–145)
Total Protein: 6.6 g/dL (ref 6.5–8.1)

## 2018-04-14 MED ORDER — SODIUM CHLORIDE 0.9% FLUSH
10.0000 mL | INTRAVENOUS | Status: DC | PRN
  Administered 2018-04-14: 10 mL via INTRAVENOUS
  Filled 2018-04-14: qty 10

## 2018-04-14 MED ORDER — DIPHENHYDRAMINE HCL 50 MG/ML IJ SOLN
12.5000 mg | Freq: Once | INTRAMUSCULAR | Status: AC
  Administered 2018-04-14: 12.5 mg via INTRAVENOUS
  Filled 2018-04-14: qty 1

## 2018-04-14 MED ORDER — SODIUM CHLORIDE 0.9 % IV SOLN
Freq: Once | INTRAVENOUS | Status: AC
  Administered 2018-04-14: 09:00:00 via INTRAVENOUS
  Filled 2018-04-14: qty 250

## 2018-04-14 MED ORDER — ACETAMINOPHEN 325 MG PO TABS
650.0000 mg | ORAL_TABLET | Freq: Once | ORAL | Status: AC
  Administered 2018-04-14: 650 mg via ORAL
  Filled 2018-04-14: qty 2

## 2018-04-14 MED ORDER — HEPARIN SOD (PORK) LOCK FLUSH 100 UNIT/ML IV SOLN
500.0000 [IU] | Freq: Once | INTRAVENOUS | Status: AC
  Administered 2018-04-14: 500 [IU] via INTRAVENOUS
  Filled 2018-04-14: qty 5

## 2018-04-14 MED ORDER — TRASTUZUMAB CHEMO 150 MG IV SOLR
450.0000 mg | Freq: Once | INTRAVENOUS | Status: AC
  Administered 2018-04-14: 450 mg via INTRAVENOUS
  Filled 2018-04-14: qty 21.43

## 2018-04-14 MED ORDER — SODIUM CHLORIDE 0.9 % IV SOLN
420.0000 mg | Freq: Once | INTRAVENOUS | Status: AC
  Administered 2018-04-14: 420 mg via INTRAVENOUS
  Filled 2018-04-14: qty 14

## 2018-04-14 NOTE — Progress Notes (Signed)
[facility] OFFICE PROGRESS NOTE  Patient Care Team: [name], [name], MD (General Surgery) Requested, [name]   Oncology History   # June 2016- LEFT BREAST CA;  [name] ; [name] ; [name]; Her 2 Neu POS; [place] from July OF 2016; [name]; adjuvant Herceptin [ Finished July 2017]; AUG 2017- Neratinib x5 days; DISCON sec to diarrhea  # MID OCT 2018- Right breast mass-Bx- ER/PR-NEG; Her 2 NEU POSITIVE 1~2.5cm;  [?NEW primary]  # MID-OCT 2018-METASTATIC RECURRENT-oh sternal mass; Left Ax LN [Bx]/periportal LN  # OCT 25th 2018- TAXOL-HERCEPTIN-PERJETA; Jan 2019- CT PR; continue HP only.    -------------------------------------------------------------------------------   # MUGA scan- July 28th 2017- 67%.  # chronic gait/balance issues  # June 2017- left breast Bx- fat necrosis [[name]  # july 2017-  BRCA 1& 2- NEG.   MOLECULAR TESTING- F ONE- TPS- 0%; pending; ERB2 amplification; PI3K/RET amplification Others**  --------------------------------------------------    DIAGNOSIS: [ OCT 2018]- REC/MET- BREAST CA ER/PR-NEG; her 2 POS  STAGE: 4       ;GOALS: Palliative  CURRENT/MOST RECENT THERAPY- Herceptin-Perjeta      Carcinoma of overlapping sites of left breast in female, estrogen receptor negative ([place])      INTERVAL HISTORY:  [name] 77 y.o.  female pleasant patient above history of metastatic breast cancer on Herceptin plus Perjeta is here for follow-up/review this is a CT scan.  Chronic right hip pain.  Neither better nor worse.  Not responded to steroid injections. Otherwise denies any lumps or bumps.  No headaches.  Review of Systems  Constitutional: Negative for chills, diaphoresis, fever, malaise/fatigue and weight loss.  HENT: Negative for nosebleeds and  sore throat.   Eyes: Negative for double vision.  Respiratory: Negative for cough, hemoptysis, sputum production, shortness of breath and wheezing.   Cardiovascular: Negative for chest pain, palpitations, orthopnea and leg swelling.  Gastrointestinal: Negative for abdominal pain, blood in stool, constipation, diarrhea, heartburn, melena, nausea and vomiting.  Genitourinary: Negative for dysuria, frequency and urgency.  Musculoskeletal: Positive for back pain and joint pain.  Skin: Negative.  Negative for itching and rash.  Neurological: Negative for dizziness, tingling, focal weakness, weakness and headaches.  Endo/Heme/Allergies: Does not bruise/bleed easily.  Psychiatric/Behavioral: Positive for memory loss. Negative for depression. The patient is not nervous/anxious and does not have insomnia.       PAST MEDICAL HISTORY :  Past Medical History:  Diagnosis Date  . Arthritis   . Brain tumor ([place]) 1995   meningeoma  . Breast cancer ([place]) 2016   left breast; surgery 01/11/15 with Dr. Bary Castilla; path with invasive mammary and DCIS  . Breast cancer of upper-inner quadrant of left female breast ([place]) 12/15/14   Completed radiation end of December and finished chemotherapy 2 weeks ago, Left breast invasive mammary carcinoma, T1cN22mc (1.5 cm); Grade 3, [name], [name], [name], .  .Marland KitchenCataract    bilat   . DDD (degenerative disc disease), lumbar    Lumbar, previously evaluated by Dr. HEarnestine Leys . Dementia   . Fibrocystic breast disease    prior biopsy  . Glaucoma   . 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 Hearing loss   . History of cancer chemotherapy   .  History of radiation therapy   . Hyperlipidemia, unspecified   . Imbalance   . Memory impairment    seen by Dr Manuella Ghazi  possible post crainiotomy from radiation  . 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  .  Numbness and tingling    right hand   . Osteoarthritis   . Osteoporosis, post-menopausal   . Personal history of chemotherapy   . Personal history of radiation therapy   . Shoulder pain, right   . Wears glasses     PAST SURGICAL HISTORY :   Past Surgical History:  Procedure Laterality Date  . APPENDECTOMY  1950  . BRAIN SURGERY  1995   left frontal/temporal  . BREAST BIOPSY Left 1997  . BREAST BIOPSY Left 12/15/14   confirmed DCIS  . BREAST BIOPSY Left 01/08/2015   Procedure: BREAST BIOPSY WITH NEEDLE LOCALIZATION;  Surgeon: [name], MD;  Location: [facility] ORS;  Service: General;  Laterality: Left;  . BREAST LUMPECTOMY Left 01/08/2015   Procedure: LUMPECTOMY;  Surgeon: [name], MD;  Location: [facility] OR[facility];  Service: General;  Laterality: Left;  . COLONOSCOPY  2010   Dr. [name]  . ORIF ANKLE FRACTURE Right 08/05/2017   Procedure: OPEN REDUCTION INTERNAL FIXATION (ORIF) ANKLE FRACTURE;  Surgeon: Earnestine Leys, MD;  Location: [facility] OR[facility];  Service: Orthopedics;  Laterality: Right;  . PORTACATH PLACEMENT Right 01/16/2015   Procedure: INSERTION PORT-A-CATH;  Surgeon: [name], MD;  Location: [facility] OR[facility];  Service: General;  Laterality: Right;  . SENTINEL NODE BIOPSY Left 01/16/2015   Procedure: SENTINEL NODE BIOPSY;  Surgeon: [name], MD;  Location: [facility];  Service: General;  Laterality: Left;  . TOTAL HIP ARTHROPLASTY Right 09/04/2015   Procedure: RIGHT TOTAL HIP ARTHROPLASTY ANTERIOR APPROACH;  Surgeon: [name], MD;  Location: [facility];  Service: Orthopedics;  Laterality: Right;    FAMILY HISTORY :   Family History  Problem Relation Age of Onset  . Breast cancer Sister 19  . Addison's disease Mother   . Hyperthyroidism Mother   . Osteoarthritis Mother   . Stroke Father   . Heart disease Father   . High blood pressure Father     SOCIAL HISTORY:   Social History   Tobacco Use  . Smoking status: Former Smoker    Packs/day: 0.25    Years: 5.00     Pack years: 1.25    Types: Cigarettes    Last attempt to quit: 07/28/1972    Years since quitting: 45.7  . Smokeless tobacco: Never Used  Substance Use Topics  . Alcohol use: Yes    Alcohol/week: 1.0 - 2.0 standard drinks    Types: 1 - 2 Glasses of wine per week    Comment: 1 Glass Wine / Night  . Drug use: No    ALLERGIES:  is allergic to donepezil.  MEDICATIONS:  Current Outpatient Medications  Medication Sig Dispense Refill  . acetaminophen (TYLENOL) 325 MG tablet Take 650 mg by mouth every 4 (four) hours as needed. Pain / increased temp.    Marland Kitchen aspirin 325 MG EC tablet Take 325 mg by mouth 2 (two) times daily.     . bisacodyl (DULCOLAX) 10 MG suppository Place 1 suppository (10 mg total) rectally daily as needed for moderate constipation. 12 suppository 0  . Calcium Carbonate-Vit D-Min (CALTRATE 600+D PLUS MINERALS) 600-800 MG-UNIT TABS Take 1 tablet by mouth every morning.     Marland Kitchen  Calcium Carbonate-Vitamin D (CALTRATE 600+D PO) Take 1 tablet by mouth daily.    . celecoxib (CELEBREX) 200 MG capsule Take 200 mg by mouth 2 (two) times daily.    . Cholecalciferol (VITAMIN D-3) 5000 units TABS Take 1 tablet by mouth daily.    Marland Kitchen levETIRAcetam (KEPPRA) 250 MG tablet 1 tablet daily at bedtime  5  . loratadine (CLARITIN) 10 MG tablet Take 10 mg by mouth daily as needed for allergies.     Marland Kitchen prochlorperazine (COMPAZINE) 10 MG tablet Take 1 tablet (10 mg total) every 6 (six) hours as needed by mouth for nausea or vomiting. 40 tablet 1  . traMADol (ULTRAM) 50 MG tablet Take 1 tablet (50 mg total) by mouth 2 (two) times daily as needed for moderate pain. 60 tablet 0  . vitamin C (ASCORBIC ACID) 250 MG tablet Take 250 mg by mouth daily.    . vitamin E 400 UNIT capsule Take 400 Units by mouth daily.      No current facility-administered medications for this visit.     PHYSICAL EXAMINATION: ECOG PERFORMANCE STATUS: 1 - Symptomatic but completely ambulatory  BP 126/72 (BP Location: Left Arm,  Patient Position: Sitting)   Temp (!) 97.5 F (36.4 C) (Tympanic)   Resp 16   Wt 162 lb 6.4 oz (73.7 kg)   BMI 28.77 kg/m   Filed Weights   04/14/18 0845  Weight: 162 lb 6.4 oz (73.7 kg)    Physical Exam  Constitutional: She is oriented to person, place, and time and well-developed, well-nourished, and in no distress.  In wheel chair/ sec to gait instability; with husband.   HENT:  Head: Normocephalic and atraumatic.  Mouth/Throat: Oropharynx is clear and moist. No oropharyngeal exudate.  Eyes: Pupils are equal, round, and reactive to light.  Neck: Normal range of motion. Neck supple.  Cardiovascular: Normal rate and regular rhythm.  Pulmonary/Chest: No respiratory distress. She has no wheezes.  Abdominal: Soft. Bowel sounds are normal. She exhibits no distension and no mass. There is no tenderness. There is no rebound and no guarding.  Musculoskeletal: Normal range of motion. She exhibits no edema or tenderness.  Neurological: She is alert and oriented to person, place, and time.  Skin: Skin is warm.  Psychiatric: Affect normal.       LABORATORY DATA:  I have reviewed the data as listed    Component Value Date/Time   NA 138 04/14/2018 0820   K 3.8 04/14/2018 0820   CL 102 04/14/2018 0820   CO2 28 04/14/2018 0820   GLUCOSE 174 (H) 04/14/2018 0820   BUN 15 04/14/2018 0820   CREATININE 0.86 04/14/2018 0820   CALCIUM 8.6 (L) 04/14/2018 0820   PROT 6.6 04/14/2018 0820   ALBUMIN 3.9 04/14/2018 0820   AST 29 04/14/2018 0820   ALT 20 04/14/2018 0820   ALKPHOS 67 04/14/2018 0820   BILITOT 0.5 04/14/2018 0820   GFRNONAA >60 04/14/2018 0820   GFRAA >60 04/14/2018 0820    No results found for: SPEP, UPEP  Lab Results  Component Value Date   WBC 4.9 04/14/2018   NEUTROABS 3.4 04/14/2018   HGB 13.4 04/14/2018   HCT 39.0 04/14/2018   MCV 90.3 04/14/2018   PLT 294 04/14/2018      Chemistry      Component Value Date/Time   NA 138 04/14/2018 0820   K 3.8  04/14/2018 0820   CL 102 04/14/2018 0820   CO2 28 04/14/2018 0820   BUN 15 04/14/2018  0820   CREATININE 0.86 04/14/2018 0820      Component Value Date/Time   CALCIUM 8.6 (L) 04/14/2018 0820   ALKPHOS 67 04/14/2018 0820   AST 29 04/14/2018 0820   ALT 20 04/14/2018 0820   BILITOT 0.5 04/14/2018 0820       RADIOGRAPHIC STUDIES: I have personally reviewed the radiological images as listed and agreed with the findings in the report. No results found.   ASSESSMENT & PLAN:  Carcinoma of overlapping sites of left breast in female, estrogen receptor negative ([place]) #Metastatic breast cancer ER [name] HER-2/neu positive-SEP 16th 2019 CT scan NED; STABLE on Herceptin/perjeta maintenance.  # Continue Herceptin/perjeta; labs adequate.  #Chronic dementia/debility- STABLE s/p PT.   # chronic hip/back  pain- STABLE continue tramadol prn;   # follow up in 3 weekslabs/ cbc/cmp-HP;      No orders of the defined types were placed in this encounter.  All questions were answered. The patient knows to call the clinic with any problems, questions or concerns.      Cammie Sickle, MD 04/21/2018 5:39 PM

## 2018-04-14 NOTE — Assessment & Plan Note (Addendum)
#  Metastatic breast cancer ER [name] HER-2/neu positive-SEP 16th 2019 CT scan NED; STABLE on Herceptin/perjeta maintenance.  # Continue Herceptin/perjeta; labs adequate.  #Chronic dementia/debility- STABLE s/p PT.   # chronic hip/back  pain- STABLE continue tramadol prn;   # follow up in 3 weekslabs/ cbc/cmp-HP;

## 2018-04-26 ENCOUNTER — Inpatient Hospital Stay: Payer: Medicare Other

## 2018-04-26 NOTE — Progress Notes (Signed)
Nutrition Assessment   Reason for Assessment:   Husband with nutrition related questions   ASSESSMENT:  77 year old female with metastatic breast cancer.  Past medical history of dementia, meningeoma, HLD, hip and back pain.  Met with patient's husband today in clinic.  Wife at home.  Husband reports appetite is very good.  Husband does all of cooking and very limited cooking.  Does not have the patience to cook (chopping vegetables, etc).  Wife continues to make homemade bread.  Wife typically eats honey bunches of oat cereal with skim milk, coffee, fruit.  Lunch is usually peanut butter and jelly on homemade bread and dinner is steak or salmon, potato, salad do not use dressing.  Typically eats out 4-5 times per week and split a meal.  Mainly drinks water, coffee, wine mixed with seltzer water (with dinner), orange juice with small amount of prune juice. Also enjoys ice cream couple of nights per week.  No problems with appetite, nausea, vomiting, constipation or diarrhea.      Nutrition Focused Physical Exam: unable to determine   Medications: dulcolax, calcium and vit d, compazine, Vit C, Vit E   Labs: glucose 174   Anthropometrics:   Height: 63 inches Weight: 162 lb 6.4 oz 9/18 UBW: 165 lb on 08/19/17 BMI: 28   Estimated Energy Needs  Kcals: 3559-7416 calories/d Protein: 81-93 g  Fluid: 1.8 L/d   NUTRITION DIAGNOSIS: Food and nutrition knowledge deficit related to cancer and dementia as evidenced by husband with questions   MALNUTRITION DIAGNOSIS: none at this time   INTERVENTION:  Discussed well -balanced diet including colorful fruits and vegetables, lean protein (plant and animal products), whole grains.   Discussed easy to prepare foods with husband. Contact information provided to patient   MONITORING, EVALUATION, GOAL: Patient will consume well balanced diet to maintain nutrition   Next Visit: no follow-up planned at this time.  Husband to contact me  with questions  Shon Indelicato B. Zenia Resides, [place], [place] Registered Dietitian [phone] (pager)

## 2018-05-04 ENCOUNTER — Other Ambulatory Visit: Payer: Self-pay | Admitting: *Deleted

## 2018-05-04 DIAGNOSIS — C50912 Malignant neoplasm of unspecified site of left female breast: Secondary | ICD-10-CM

## 2018-05-04 DIAGNOSIS — C773 Secondary and unspecified malignant neoplasm of axilla and upper limb lymph nodes: Principal | ICD-10-CM

## 2018-05-05 ENCOUNTER — Inpatient Hospital Stay: Payer: Medicare Other | Attending: Internal Medicine

## 2018-05-05 ENCOUNTER — Inpatient Hospital Stay: Payer: Medicare Other

## 2018-05-05 ENCOUNTER — Inpatient Hospital Stay (HOSPITAL_BASED_OUTPATIENT_CLINIC_OR_DEPARTMENT_OTHER): Payer: Medicare Other | Admitting: Internal Medicine

## 2018-05-05 ENCOUNTER — Encounter: Payer: Self-pay | Admitting: Internal Medicine

## 2018-05-05 ENCOUNTER — Other Ambulatory Visit: Payer: Self-pay

## 2018-05-05 VITALS — BP 121/76 | HR 80 | Temp 96.0°F | Resp 20 | Ht 63.0 in | Wt 167.3 lb

## 2018-05-05 DIAGNOSIS — Z171 Estrogen receptor negative status [ER-]: Secondary | ICD-10-CM

## 2018-05-05 DIAGNOSIS — C50912 Malignant neoplasm of unspecified site of left female breast: Secondary | ICD-10-CM

## 2018-05-05 DIAGNOSIS — C50812 Malignant neoplasm of overlapping sites of left female breast: Secondary | ICD-10-CM

## 2018-05-05 DIAGNOSIS — Z5112 Encounter for antineoplastic immunotherapy: Secondary | ICD-10-CM | POA: Insufficient documentation

## 2018-05-05 DIAGNOSIS — C773 Secondary and unspecified malignant neoplasm of axilla and upper limb lymph nodes: Principal | ICD-10-CM

## 2018-05-05 LAB — COMPREHENSIVE METABOLIC PANEL
ALT: 21 U/L (ref 0–44)
AST: 32 U/L (ref 15–41)
Albumin: 4 g/dL (ref 3.5–5.0)
Alkaline Phosphatase: 69 U/L (ref 38–126)
Anion gap: 11 (ref 5–15)
BUN: 14 mg/dL (ref 8–23)
CHLORIDE: 102 mmol/L (ref 98–111)
CO2: 26 mmol/L (ref 22–32)
Calcium: 9.3 mg/dL (ref 8.9–10.3)
Creatinine, Ser: 0.86 mg/dL (ref 0.44–1.00)
GFR calc non Af Amer: >60 mL/min (ref >60)
Glucose, Bld: 193 mg/dL — ABNORMAL HIGH (ref 70–99)
Potassium: 3.8 mmol/L (ref 3.5–5.1)
Sodium: 139 mmol/L (ref 135–145)
Total Bilirubin: 0.6 mg/dL (ref 0.3–1.2)
Total Protein: 7 g/dL (ref 6.5–8.1)

## 2018-05-05 LAB — CBC WITH DIFFERENTIAL/PLATELET
ABS IMMATURE GRANULOCYTES: 0.01 10*3/uL (ref 0.00–0.07)
Basophils Absolute: 0 10*3/uL (ref 0.0–0.1)
Basophils Relative: 1 %
Eosinophils Absolute: 0.3 10*3/uL (ref 0.0–0.5)
Eosinophils Relative: 5 %
HEMATOCRIT: 43.1 % (ref 36.0–46.0)
HEMOGLOBIN: 13.6 g/dL (ref 12.0–15.0)
Immature Granulocytes: 0 %
LYMPHS ABS: 1 10*3/uL (ref 0.7–4.0)
Lymphocytes Relative: 17 %
MCH: 29.2 pg (ref 26.0–34.0)
MCHC: 31.6 g/dL (ref 30.0–36.0)
MCV: 92.5 fL (ref 80.0–100.0)
Monocytes Absolute: 0.4 10*3/uL (ref 0.1–1.0)
Monocytes Relative: 8 %
NRBC: 0 % (ref 0.0–0.2)
Neutro Abs: 4 10*3/uL (ref 1.7–7.7)
Neutrophils Relative %: 69 %
Platelets: 301 10*3/uL (ref 150–400)
RBC: 4.66 MIL/uL (ref 3.87–5.11)
RDW: 13.5 % (ref 11.5–15.5)
WBC: 5.8 10*3/uL (ref 4.0–10.5)

## 2018-05-05 MED ORDER — TRASTUZUMAB CHEMO 150 MG IV SOLR
450.0000 mg | Freq: Once | INTRAVENOUS | Status: AC
  Administered 2018-05-05: 450 mg via INTRAVENOUS
  Filled 2018-05-05: qty 21.43

## 2018-05-05 MED ORDER — SODIUM CHLORIDE 0.9 % IV SOLN
420.0000 mg | Freq: Once | INTRAVENOUS | Status: AC
  Administered 2018-05-05: 420 mg via INTRAVENOUS
  Filled 2018-05-05: qty 14

## 2018-05-05 MED ORDER — HEPARIN SOD (PORK) LOCK FLUSH 100 UNIT/ML IV SOLN
500.0000 [IU] | Freq: Once | INTRAVENOUS | Status: AC
  Administered 2018-05-05: 500 [IU] via INTRAVENOUS
  Filled 2018-05-05: qty 5

## 2018-05-05 MED ORDER — SODIUM CHLORIDE 0.9 % IV SOLN
Freq: Once | INTRAVENOUS | Status: AC
  Administered 2018-05-05: 09:00:00 via INTRAVENOUS
  Filled 2018-05-05: qty 250

## 2018-05-05 MED ORDER — SODIUM CHLORIDE 0.9% FLUSH
10.0000 mL | INTRAVENOUS | Status: DC | PRN
  Administered 2018-05-05: 10 mL via INTRAVENOUS
  Filled 2018-05-05: qty 10

## 2018-05-05 MED ORDER — ACETAMINOPHEN 325 MG PO TABS
650.0000 mg | ORAL_TABLET | Freq: Once | ORAL | Status: AC
  Administered 2018-05-05: 650 mg via ORAL
  Filled 2018-05-05: qty 2

## 2018-05-05 MED ORDER — DIPHENHYDRAMINE HCL 50 MG/ML IJ SOLN
12.5000 mg | Freq: Once | INTRAMUSCULAR | Status: AC
  Administered 2018-05-05: 12.5 mg via INTRAVENOUS
  Filled 2018-05-05: qty 1

## 2018-05-05 NOTE — Assessment & Plan Note (Addendum)
#  Metastatic breast cancer ER [name] HER-2/neu positive-SEP 16th 2019 CT scan NED; STABLE; on Herceptin/perjeta maintenance.  # Continue Herceptin/perjeta; labs adequate.  #Chronic dementia/debility- STABLE s/p PT.   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

## 2018-05-05 NOTE — Progress Notes (Signed)
[place] OFFICE PROGRESS NOTE  Patient Care Team: [name], [name], MD (General Surgery) Requested, [name]   Oncology History   # June 2016- LEFT BREAST CA;  [name] ; [name] ; [name]; Her 2 Neu POS; [place] from July OF 2016; [name]; adjuvant Herceptin [ Finished July 2017]; AUG 2017- Neratinib x5 days; DISCON sec to diarrhea  # MID OCT 2018- Right breast mass-Bx- ER/PR-NEG; Her 2 NEU POSITIVE 1~2.5cm;  [?NEW primary]  # MID-OCT 2018-METASTATIC RECURRENT-oh sternal mass; Left Ax LN [Bx]/periportal LN  # OCT 25th 2018- TAXOL-HERCEPTIN-PERJETA; Jan 2019- CT PR; continue HP only.    -------------------------------------------------------------------------------   # MUGA scan- July 28th 2017- 67%.  # chronic gait/balance issues  # June 2017- left breast Bx- fat necrosis [[name]  # july 2017-  BRCA 1& 2- NEG.   MOLECULAR TESTING- F ONE- TPS- 0%; pending; ERB2 amplification; PI3K/RET amplification Others**  --------------------------------------------------    DIAGNOSIS: [ OCT 2018]- REC/MET- BREAST CA ER/PR-NEG; her 2 POS  STAGE: 4       ;GOALS: Palliative  CURRENT/MOST RECENT THERAPY- Herceptin-Perjeta      Carcinoma of overlapping sites of left breast in female, estrogen receptor negative ([place])      INTERVAL HISTORY:  [name] 77 y.o.  female pleasant patient above history of metastatic breast cancer on Herceptin plus Perjeta is here for follow-up.  Patient was recently evaluated by ENT left ear with hearing loss.  To have worsening sensorineural deficit.  Hip pain chronic stable.  Complains of currently back pain.  On tramadol as needed.  No falls.  Review of Systems  Constitutional: Negative for chills, diaphoresis, fever, malaise/fatigue and weight  loss.  HENT: Negative for nosebleeds and sore throat.   Eyes: Negative for double vision.  Respiratory: Negative for cough, hemoptysis, sputum production, shortness of breath and wheezing.   Cardiovascular: Negative for chest pain, palpitations, orthopnea and leg swelling.  Gastrointestinal: Negative for abdominal pain, blood in stool, constipation, diarrhea, heartburn, melena, nausea and vomiting.  Genitourinary: Negative for dysuria, frequency and urgency.  Musculoskeletal: Positive for back pain and joint pain.  Skin: Negative.  Negative for itching and rash.  Neurological: Negative for dizziness, tingling, focal weakness, weakness and headaches.  Endo/Heme/Allergies: Does not bruise/bleed easily.  Psychiatric/Behavioral: Positive for memory loss. Negative for depression. The patient is not nervous/anxious and does not have insomnia.       PAST MEDICAL HISTORY :  Past Medical History:  Diagnosis Date  . Arthritis   . Brain tumor ([place]) 1995   meningeoma  . Breast cancer ([place]) 2016   left breast; surgery 01/11/15 with Dr. Bary Castilla; path with invasive mammary and DCIS  . Breast cancer of upper-inner quadrant of left female breast ([place]) 12/15/14   Completed radiation end of December and finished chemotherapy 2 weeks ago, Left breast invasive mammary carcinoma, T1cN54mc (1.5 cm); Grade 3, [name], [name], [name], .  .Marland KitchenCataract    bilat   . DDD (degenerative disc disease), lumbar    Lumbar, previously evaluated by Dr. [place]arnestine Leys . [place]   . Fibrocystic breast disease    prior biopsy  . Glaucoma   . 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 Hearing loss   .  History of cancer chemotherapy   . History of radiation therapy   . Hyperlipidemia, unspecified   . Imbalance   . Memory impairment    seen by Dr Manuella Ghazi  possible post crainiotomy from radiation  . Meningioma ([place])    Left cavernous sinus meningioma, treated with resection  and radiation therapy at [facility], 1995.  . Numbness and tingling    right hand   . Osteoarthritis   . Osteoporosis, post-menopausal   . Personal history of chemotherapy   . Personal history of radiation therapy   . Shoulder pain, right   . Wears glasses     PAST SURGICAL HISTORY :   Past Surgical History:  Procedure Laterality Date  . APPENDECTOMY  1950  . BRAIN SURGERY  1995   left frontal/temporal  . BREAST BIOPSY Left 1997  . BREAST BIOPSY Left 12/15/14   confirmed DCIS  . BREAST BIOPSY Left 01/08/2015   Procedure: BREAST BIOPSY WITH NEEDLE LOCALIZATION;  Surgeon: [name], MD;  Location: [facility] ORS;  Service: General;  Laterality: Left;  . BREAST LUMPECTOMY Left 01/08/2015   Procedure: LUMPECTOMY;  Surgeon: [name], MD;  Location: [facility] ORS;  Service: General;  Laterality: Left;  . COLONOSCOPY  2010   Dr. [name]  . ORIF ANKLE FRACTURE Right 08/05/2017   Procedure: OPEN REDUCTION INTERNAL FIXATION (ORIF) ANKLE FRACTURE;  Surgeon: Earnestine Leys, MD;  Location: [facility] ORS;  Service: Orthopedics;  Laterality: Right;  . PORTACATH PLACEMENT Right 01/16/2015   Procedure: INSERTION PORT-A-CATH;  Surgeon: [name], MD;  Location: [facility] ORS;  Service: General;  Laterality: Right;  . SENTINEL NODE BIOPSY Left 01/16/2015   Procedure: SENTINEL NODE BIOPSY;  Surgeon: [name], MD;  Location: [facility] ORS;  Service: General;  Laterality: Left;  . TOTAL HIP ARTHROPLASTY Right 09/04/2015   Procedure: RIGHT TOTAL HIP ARTHROPLASTY ANTERIOR APPROACH;  Surgeon: [name], MD;  Location: [facility];  Service: Orthopedics;  Laterality: Right;    FAMILY HISTORY :   Family History  Problem Relation Age of Onset  . Breast cancer Sister 53  . Addison's disease Mother   . Hyperthyroidism Mother   . Osteoarthritis Mother   . Stroke Father   . Heart disease Father   . High blood pressure Father     SOCIAL HISTORY:   Social History   Tobacco Use  . Smoking status: Former  Smoker    Packs/day: 0.25    Years: 5.00    Pack years: 1.25    Types: Cigarettes    Last attempt to quit: 07/28/1972    Years since quitting: 45.8  . Smokeless tobacco: Never Used  Substance Use Topics  . Alcohol use: Yes    Alcohol/week: 1.0 - 2.0 standard drinks    Types: 1 - 2 Glasses of wine per week    Comment: 1 Glass Wine / Night  . Drug use: No    ALLERGIES:  is allergic to donepezil.  MEDICATIONS:  Current Outpatient Medications  Medication Sig Dispense Refill  . acetaminophen (TYLENOL) 325 MG tablet Take 650 mg by mouth every 4 (four) hours as needed. Pain / increased temp.    Marland Kitchen aspirin 325 MG EC tablet Take 325 mg by mouth 2 (two) times daily.     . Calcium Carbonate-Vit D-Min (CALTRATE 600+D PLUS MINERALS) 600-800 MG-UNIT TABS Take 1 tablet by mouth every morning.     . Calcium Carbonate-Vitamin D (CALTRATE 600+D PO) Take 1 tablet by mouth daily.    Marland Kitchen  celecoxib (CELEBREX) 200 MG capsule Take 200 mg by mouth 2 (two) times daily.    . Cholecalciferol (VITAMIN D-3) 5000 units TABS Take 1 tablet by mouth daily.    Marland Kitchen levETIRAcetam (KEPPRA) 250 MG tablet 1 tablet daily at bedtime  5  . loratadine (CLARITIN) 10 MG tablet Take 10 mg by mouth daily as needed for allergies.     Marland Kitchen prochlorperazine (COMPAZINE) 10 MG tablet Take 1 tablet (10 mg total) every 6 (six) hours as needed by mouth for nausea or vomiting. 40 tablet 1  . traMADol (ULTRAM) 50 MG tablet Take 1 tablet (50 mg total) by mouth 2 (two) times daily as needed for moderate pain. 60 tablet 0  . vitamin C (ASCORBIC ACID) 250 MG tablet Take 250 mg by mouth daily.    . vitamin E 400 UNIT capsule Take 400 Units by mouth daily.     . bisacodyl (DULCOLAX) 10 MG suppository Place 1 suppository (10 mg total) rectally daily as needed for moderate constipation. (Patient not taking: Reported on 05/05/2018) 12 suppository 0   No current facility-administered medications for this visit.    Facility-Administered Medications Ordered  in Other Visits  Medication Dose Route Frequency Provider Last Rate Last Dose  . heparin lock flush 100 unit/mL  500 Units Intravenous Once Charlaine Dalton R, MD      . pertuzumab (PERJETA) 420 mg in sodium chloride 0.9 % 250 mL chemo infusion  420 mg Intravenous Once Charlaine Dalton R, MD 528 mL/hr at 05/05/18 1012 420 mg at 05/05/18 1012  . sodium chloride flush (NS) 0.9 % injection 10 mL  10 mL Intravenous PRN Cammie Sickle, MD   10 mL at 05/05/18 0811    PHYSICAL EXAMINATION: ECOG PERFORMANCE STATUS: 1 - Symptomatic but completely ambulatory  BP 121/76 (Patient Position: Sitting)   Pulse 80   Temp (!) 96 F (35.6 C) (Tympanic)   Resp 20   Ht _0  (1.6 m)   Wt 167 lb 5.3 oz (75.9 kg)   BMI 29.64 kg/m   Filed Weights   05/05/18 0827  Weight: 167 lb 5.3 oz (75.9 kg)    Physical Exam  Constitutional: She is oriented to person, place, and time and well-developed, well-nourished, and in no distress.  In wheel chair/ sec to gait instability; with husband.   HENT:  Head: Normocephalic and atraumatic.  Mouth/Throat: Oropharynx is clear and moist. No oropharyngeal exudate.  Eyes: Pupils are equal, round, and reactive to light.  Neck: Normal range of motion. Neck supple.  Cardiovascular: Normal rate and regular rhythm.  Pulmonary/Chest: No respiratory distress. She has no wheezes.  Abdominal: Soft. Bowel sounds are normal. She exhibits no distension and no mass. There is no tenderness. There is no rebound and no guarding.  Musculoskeletal: Normal range of motion. She exhibits no edema or tenderness.  Neurological: She is alert and oriented to person, place, and time.  Skin: Skin is warm.  Psychiatric: Affect normal.       LABORATORY DATA:  I have reviewed the data as listed    Component Value Date/Time   NA 139 05/05/2018 0811   K 3.8 05/05/2018 0811   CL 102 05/05/2018 0811   CO2 26 05/05/2018 0811   GLUCOSE 193 (H) 05/05/2018 0811   BUN 14 05/05/2018  0811   CREATININE 0.86 05/05/2018 0811   CALCIUM 9.3 05/05/2018 0811   PROT 7.0 05/05/2018 0811   ALBUMIN 4.0 05/05/2018 0811   AST 32 05/05/2018 5284  ALT 21 05/05/2018 0811   ALKPHOS 69 05/05/2018 0811   BILITOT 0.6 05/05/2018 0811   GFRNONAA >60 05/05/2018 0811   GFRAA >60 05/05/2018 0811    No results found for: SPEP, UPEP  Lab Results  Component Value Date   WBC 5.8 05/05/2018   NEUTROABS 4.0 05/05/2018   HGB 13.6 05/05/2018   HCT 43.1 05/05/2018   MCV 92.5 05/05/2018   PLT 301 05/05/2018      Chemistry      Component Value Date/Time   NA 139 05/05/2018 0811   K 3.8 05/05/2018 0811   CL 102 05/05/2018 0811   CO2 26 05/05/2018 0811   BUN 14 05/05/2018 0811   CREATININE 0.86 05/05/2018 0811      Component Value Date/Time   CALCIUM 9.3 05/05/2018 0811   ALKPHOS 69 05/05/2018 0811   AST 32 05/05/2018 0811   ALT 21 05/05/2018 0811   BILITOT 0.6 05/05/2018 0811       RADIOGRAPHIC STUDIES: I have personally reviewed the radiological images as listed and agreed with the findings in the report. No results found.   ASSESSMENT & PLAN:  Carcinoma of overlapping sites of left breast in female, estrogen receptor negative ([place]) #Metastatic breast cancer ER [name] HER-2/neu positive-SEP 16th 2019 CT scan NED; STABLE; on Herceptin/perjeta maintenance.  # Continue Herceptin/perjeta; labs adequate.  #Chronic dementia/debility- STABLE s/p PT.   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   No orders of the defined types were placed in this encounter.  All questions were answered. The patient knows to call the clinic with any problems, questions or concerns.      Cammie Sickle, MD 05/05/2018 10:28 AM

## 2018-05-05 NOTE — Progress Notes (Signed)
New hearing loss in left ear

## 2018-05-24 ENCOUNTER — Encounter: Payer: Self-pay | Admitting: *Deleted

## 2018-05-24 ENCOUNTER — Encounter: Payer: Medicare Other | Attending: Physician Assistant | Admitting: Physician Assistant

## 2018-05-24 NOTE — Progress Notes (Signed)
  Oncology Nurse Navigator Documentation  Navigator Location: [facility] (05/24/18 1300)   )Navigator Encounter Type: Telephone (05/24/18 1300) Telephone: Incoming Call (05/24/18 1300)                                                  Time Spent with Patient: 30 (05/24/18 1300)   Patients husband called today to ask advice from Dr. Rogue Bussing.  States patient had a sudden onset of hearing loss on one side.  States its the same side that she had the previous brain cancer.  States Dr. Tami Ribas believes this to be a neurovascular event and hyperbaric treatment has been recommended.  He wants to know if Dr. Rogue Bussing has any concerns regarding her cancer and the hyperbaric treatment.   Discussed with Dr. Rogue Bussing and he feels it is ok to proceed with treatment.  Left message on husbands voicemail.

## 2018-05-25 NOTE — Progress Notes (Signed)
YESLY, GERETY ([id number]) Visit Report for 05/24/2018 HBO Risk Assessment Details Patient Name: [name], [name]. Date of Service: 05/24/2018 10:30 AM Medical Record Number: [id number] Patient Account Number: [id number] Date of Birth/Sex: May 27, 1941 (77 y.o. Female) Treating RN: Montey Hora Primary Care Korben Carcione: Ramonita Lab Other Clinician: Referring Raef Sprigg: Carloyn Manner Treating Yilia Sacca/Extender: STONE III, HOYT Weeks in Treatment: 0 HBO Risk Assessment Items Answer Barotrauma Risks: Upper Respiratory Infections No Prior Radiation Treatment to Head/Neck Yes Tracheostomy No Ear problems or surgery (otosclerosis)- Consider pressure equalization tubes Yes Sinus Problems, Sinus Obstruction No Pulmonary Risks: Currently seeing a pulmonologisto No Emphysema No Pneumothorax No Tuberculosis No Other lung problems (COPD with CO2 retention, lesions, surgery) -Refer to CPGs No Congestive heart Failure -Consider holding HBO if ejection fraction<30% No History of smoking No Bullous Disease, Blebs No Other pulmonary abnormalities No Cardiac Risks: Currently seeing a cardiologisto No Pacemaker/AICD No Hypertension No Diuretic Used (water pill). If yes, last time taken: No History of prior or current malignancy (Cancer) Surgery Yes Radiation therapy Yes Chemotherapy Yes Ophthalmic Risks: Optic Neuritis No Cataracts No Myopia No Retinopathy or Retinal Detachment Surgery- Consider pressure equalization tubes No Confinement Anxiety Claustrophobia No Dialysis DANELLA, PHILSON ([id number]) Dialysis No Any implants; medical or non-medical No Pregnancy No Diabetes HgbA1C within 3 months No Seizures Seizures No Currently using these medications: Aspirin No Digoxin (CHF patient) No Narcotics No Nitroprusside No Phenothiazine (Thorazine,etc.) No Prednisone or other steroids No Disulfiram (Antabuse) No Mafenide Acetate (Sulfamylon-burn cream) No Amiodarone  No Date of last Chest X-ray: 08/05/2017 Date of last EKG: 08/05/2017 Date of Last CBC: 05/05/2018 Electronic Signature(s) Signed: 05/24/2018 5:02:27 PM By: Montey Hora Entered By: Montey Hora on 05/24/2018 11:03:27

## 2018-05-25 NOTE — Progress Notes (Addendum)
GREGORY, DOWE ([id number]) Visit Report for 05/24/2018 Allergy List Details Patient Name: [name], [name]. Date of Service: 05/24/2018 10:30 AM Medical Record Number: [id number] Patient Account Number: [id number] Date of Birth/Sex: [birth year] (77 y.o. Female) Treating RN: Montey Hora Primary Care Sacha Topor: Ramonita Lab Other Clinician: Referring Hermine Feria: Carloyn Manner Treating Su Duma/Extender: STONE III, HOYT Weeks in Treatment: 0 Allergies Active Allergies No Known Drug Allergies Allergy Notes Electronic Signature(s) Signed: 05/24/2018 5:02:27 PM By: Montey Hora Entered By: Montey Hora on 05/24/2018 10:42:31 [name] ([id number]) -------------------------------------------------------------------------------- Arrival Information Details Patient Name: [name]. Date of Service: 05/24/2018 10:30 AM Medical Record Number: [id number] Patient Account Number: [id number] Date of Birth/Sex: [birth year] (77 y.o. Female) Treating RN: Cornell Barman Primary Care Datha Kissinger: Ramonita Lab Other Clinician: Referring Sonia Bromell: Carloyn Manner Treating Janira Mandell/Extender: Melburn Hake, HOYT Weeks in Treatment: 0 Visit Information Patient Arrived: Ambulatory Arrival Time: 10:38 Accompanied By: spouse Transfer Assistance: None Patient Identification Verified: Yes Secondary Verification Process Completed: Yes Electronic Signature(s) Signed: 05/24/2018 3:33:32 PM By: Lorine Bears RCP, RRT, CHT Entered By: Lorine Bears on 05/24/2018 10:39:02 [name] ([id number]) -------------------------------------------------------------------------------- Lower Extremity Assessment Details Patient Name: [name]. Date of Service: 05/24/2018 10:30 AM Medical Record Number: [id number] Patient Account Number: [id number] Date of Birth/Sex: 1940/10/05 (77 y.o. Female) Treating RN: Cornell Barman Primary Care Shellia Hartl: Ramonita Lab Other  Clinician: Referring Gohan Collister: Carloyn Manner Treating Hartlee Amedee/Extender: Melburn Hake, HOYT Weeks in Treatment: 0 Electronic Signature(s) Signed: 05/24/2018 3:24:13 PM By: Gretta Cool, BSN, RN, CWS, Kim RN, BSN Entered By: Gretta Cool, BSN, RN, CWS, Kim on 05/24/2018 11:19:32 [name] ([id number]) -------------------------------------------------------------------------------- Pain Assessment Details Patient Name: [name]. Date of Service: 05/24/2018 10:30 AM Medical Record Number: [id number] Patient Account Number: [id number] Date of Birth/Sex: 11/19/1940 (77 y.o. Female) Treating RN: Cornell Barman Primary Care Altovise Wahler: Ramonita Lab Other Clinician: Referring Makai Dumond: Carloyn Manner Treating Madalene Mickler/Extender: Melburn Hake, HOYT Weeks in Treatment: 0 Active Problems Location of Pain Severity and Description of Pain Patient Has Paino No Site Locations Pain Management and Medication Current Pain Management: Electronic Signature(s) Signed: 05/24/2018 3:24:13 PM By: Gretta Cool, BSN, RN, CWS, Kim RN, BSN Signed: 05/24/2018 3:33:32 PM By: Lorine Bears RCP, RRT, CHT Entered By: Lorine Bears on 05/24/2018 10:40:12 [name] ([id number]) -------------------------------------------------------------------------------- Vitals Details Patient Name: [name]. Date of Service: 05/24/2018 10:30 AM Medical Record Number: [id number] Patient Account Number: [id number] Date of Birth/Sex: 1940-12-22 (77 y.o. Female) Treating RN: Cornell Barman Primary Care Latrice Storlie: Ramonita Lab Other Clinician: Referring Damel Querry: Carloyn Manner Treating Lallie Strahm/Extender: STONE III, HOYT Weeks in Treatment: 0 Vital Signs Time Taken: 10:40 Temperature (F): 97.9 Height (in): 64 Pulse (bpm): 75 Weight (lbs): 164 Respiratory Rate (breaths/min): 16 Source: Measured Blood Pressure (mmHg): 126/73 Body Mass Index (BMI): 28.1 Reference Range: 80 - 120 mg /  dl Electronic Signature(s) Signed: 05/24/2018 3:33:32 PM By: Lorine Bears RCP, RRT, CHT Entered By: Becky Sax, Amado Nash on 05/24/2018 10:41:49

## 2018-05-25 NOTE — Progress Notes (Signed)
Michele, [name] ([id number]) Visit Report for 05/24/2018 Abuse/Suicide Risk Screen Details Patient Name: [name], [name]. Date of Service: 05/24/2018 10:30 AM Medical Record Number: [id number] Patient Account Number: [id number] Date of Birth/Sex: April 23, 1941 (77 y.o. Female) Treating RN: Montey Hora Primary Care Amare Kontos: Ramonita Lab Other Clinician: Referring Caileen Veracruz: Carloyn Manner Treating Ariv Penrod/Extender: STONE III, HOYT Weeks in Treatment: 0 Abuse/Suicide Risk Screen Items Answer ABUSE/SUICIDE RISK SCREEN: Has anyone close to you tried to hurt or harm you recentlyo No Do you feel uncomfortable with anyone in your familyo No Has anyone forced you do things that you didnot want to doo No Do you have any thoughts of harming yourselfo No Patient displays signs or symptoms of abuse and/or neglect. No Electronic Signature(s) Signed: 05/24/2018 5:02:27 PM By: Montey Hora Entered By: Montey Hora on 05/24/2018 10:42:52 [name] ([id number]) -------------------------------------------------------------------------------- Activities of Daily Living Details Patient Name: [name]. Date of Service: 05/24/2018 10:30 AM Medical Record Number: [id number] Patient Account Number: [id number] Date of Birth/Sex: July 10, 1941 (77 y.o. Female) Treating RN: Montey Hora Primary Care Jaison Petraglia: Ramonita Lab Other Clinician: Referring Tamotsu Wiederholt: Carloyn Manner Treating Japleen Tornow/Extender: STONE III, HOYT Weeks in Treatment: 0 Activities of Daily Living Items Answer Activities of Daily Living (Please select one for each item) Drive Automobile Not Able Take Medications Completely Able Use Telephone Completely Able Care for Appearance Completely Able Use Toilet Completely Able Bath / Shower Completely Able Dress Self Completely Able Feed Self Completely Able Walk Completely Able Get In / Out Bed Completely [place] Need Assistance Shop for Self Need Assistance Electronic Signature(s) Signed: 05/24/2018 5:02:27 PM By: Montey Hora Entered By: Montey Hora on 05/24/2018 10:44:36 [name] ([id number]) -------------------------------------------------------------------------------- Education Assessment Details Patient Name: [name]. Date of Service: 05/24/2018 10:30 AM Medical Record Number: [id number] Patient Account Number: [id number] Date of Birth/Sex: 08/09/1940 (77 y.o. Female) Treating RN: Montey Hora Primary Care Wretha Laris: Ramonita Lab Other Clinician: Referring Shion Bluestein: Carloyn Manner Treating Johara Lodwick/Extender: Melburn Hake, HOYT Weeks in Treatment: 0 Primary Learner Assessed: Patient Learning Preferences/Education Level/Primary Language Learning Preference: Explanation, Demonstration Highest Education Level: College or Above Preferred Language: English Cognitive Barrier Assessment/Beliefs Language Barrier: No Translator Needed: No Memory Deficit: No Emotional Barrier: No Cultural/Religious Beliefs Affecting Medical Care: No Physical Barrier Assessment Impaired Vision: No Impaired Hearing: No Decreased Hand dexterity: No Knowledge/Comprehension Assessment Knowledge Level: Medium Comprehension Level: Medium Ability to understand written Medium instructions: Ability to understand verbal Medium instructions: Motivation Assessment Anxiety Level: Calm Cooperation: Cooperative Education Importance: Acknowledges Need Interest in Health Problems: Asks Questions Perception: Coherent Willingness to Engage in Self- Medium Management Activities: Readiness to Engage in Self- Medium Management Activities: Electronic Signature(s) Signed: 05/24/2018 5:02:27 PM By: Montey Hora Entered By: Montey Hora on 05/24/2018 10:45:26 [name]  ([id number]) -------------------------------------------------------------------------------- Fall Risk Assessment Details Patient Name: [name]. Date of Service: 05/24/2018 10:30 AM Medical Record Number: [id number] Patient Account Number: [id number] Date of Birth/Sex: May 28, 1941 (77 y.o. Female) Treating RN: Montey Hora Primary Care Danylah Holden: Ramonita Lab Other Clinician: Referring Chyla Schlender: Carloyn Manner Treating Karrisa Didio/Extender: Melburn Hake, HOYT Weeks in Treatment: 0 Fall Risk Assessment Items Have you had 2 or more falls in the last 12 monthso 0 No Have you had any fall that resulted in injury in the last 12 monthso 0 Yes FALL RISK ASSESSMENT: History of falling - immediate or within 3 months 0 No Secondary diagnosis 0 No Ambulatory aid None/bed rest/wheelchair/nurse 0 No Crutches/cane/walker 15 Yes Furniture  0 No IV Access/Saline Lock 0 No Gait/Training Normal/bed rest/immobile 0 No Weak 10 Yes Impaired 0 No Mental Status Oriented to own ability 0 Yes Electronic Signature(s) Signed: 05/24/2018 5:02:27 PM By: Montey Hora Entered By: Montey Hora on 05/24/2018 10:47:06 [name] ([id number]) -------------------------------------------------------------------------------- Foot Assessment Details Patient Name: [name]. Date of Service: 05/24/2018 10:30 AM Medical Record Number: [id number] Patient Account Number: [id number] Date of Birth/Sex: [birth year] (77 y.o. Female) Treating RN: Montey Hora Primary Care Taeshawn Helfman: Ramonita Lab Other Clinician: Referring Seldon Barrell: Carloyn Manner Treating Morton Simson/Extender: STONE III, HOYT Weeks in Treatment: 0 Foot Assessment Items Site Locations + = Sensation present, - = Sensation absent, C = Callus, U = Ulcer R = Redness, W = Warmth, M = Maceration, PU = Pre-ulcerative lesion F = Fissure, S = Swelling, D = Dryness Assessment Right: Left: Other Deformity: No No Prior Foot Ulcer: No  No Prior Amputation: No No Charcot Joint: No No Ambulatory Status: Ambulatory With Help Assistance Device: Cane Gait: Steady Electronic Signature(s) Signed: 05/24/2018 5:02:27 PM By: Montey Hora Entered By: Montey Hora on 05/24/2018 10:53:58 [name] ([id number]) -------------------------------------------------------------------------------- Nutrition Risk Assessment Details Patient Name: [name]. Date of Service: 05/24/2018 10:30 AM Medical Record Number: [id number] Patient Account Number: [id number] Date of Birth/Sex: 1940/11/22 (77 y.o. Female) Treating RN: Montey Hora Primary Care Shaneque Merkle: Ramonita Lab Other Clinician: Referring Junelle Hashemi: Carloyn Manner Treating Jamilette Suchocki/Extender: STONE III, HOYT Weeks in Treatment: 0 Height (in): 64 Weight (lbs): 164 Body Mass Index (BMI): 28.1 Nutrition Risk Assessment Items NUTRITION RISK SCREEN: I have an illness or condition that made me change the kind and/or amount of 0 No food I eat I eat fewer than two meals per day 0 No I eat few fruits and vegetables, or milk products 0 No I have three or more drinks of beer, liquor or wine almost every day 0 No I have tooth or mouth problems that make it hard for me to eat 0 No I don't always have enough money to buy the food I need 0 No I eat alone most of the time 0 No I take three or more different prescribed or over-the-counter drugs a day 1 Yes Without wanting to, I have lost or gained 10 pounds in the last six months 0 No I am not always physically able to shop, cook and/or feed myself 0 No Nutrition Protocols Good Risk Protocol 0 No interventions needed Moderate Risk Protocol Electronic Signature(s) Signed: 05/24/2018 5:02:27 PM By: Montey Hora Entered By: Montey Hora on 05/24/2018 10:47:34

## 2018-05-26 ENCOUNTER — Inpatient Hospital Stay: Payer: Medicare Other

## 2018-05-26 ENCOUNTER — Inpatient Hospital Stay (HOSPITAL_BASED_OUTPATIENT_CLINIC_OR_DEPARTMENT_OTHER): Payer: Medicare Other | Admitting: Internal Medicine

## 2018-05-26 ENCOUNTER — Encounter: Payer: Self-pay | Admitting: Internal Medicine

## 2018-05-26 ENCOUNTER — Telehealth: Payer: Self-pay | Admitting: Internal Medicine

## 2018-05-26 VITALS — BP 129/76 | HR 76 | Temp 98.1°F | Resp 16 | Wt 164.0 lb

## 2018-05-26 DIAGNOSIS — Z171 Estrogen receptor negative status [ER-]: Secondary | ICD-10-CM

## 2018-05-26 DIAGNOSIS — C50812 Malignant neoplasm of overlapping sites of left female breast: Secondary | ICD-10-CM

## 2018-05-26 DIAGNOSIS — C773 Secondary and unspecified malignant neoplasm of axilla and upper limb lymph nodes: Principal | ICD-10-CM

## 2018-05-26 DIAGNOSIS — IMO0001 Reserved for inherently not codable concepts without codable children: Secondary | ICD-10-CM

## 2018-05-26 DIAGNOSIS — R260 Ataxic gait: Secondary | ICD-10-CM

## 2018-05-26 DIAGNOSIS — Z5112 Encounter for antineoplastic immunotherapy: Secondary | ICD-10-CM | POA: Diagnosis not present

## 2018-05-26 DIAGNOSIS — H9042 Sensorineural hearing loss, unilateral, left ear, with unrestricted hearing on the contralateral side: Secondary | ICD-10-CM

## 2018-05-26 DIAGNOSIS — C50912 Malignant neoplasm of unspecified site of left female breast: Secondary | ICD-10-CM

## 2018-05-26 LAB — CBC WITH DIFFERENTIAL/PLATELET
Abs Immature Granulocytes: 0.06 10*3/uL (ref 0.00–0.07)
BASOS PCT: 0 %
Basophils Absolute: 0 10*3/uL (ref 0.0–0.1)
EOS ABS: 0.3 10*3/uL (ref 0.0–0.5)
EOS PCT: 4 %
HCT: 41.3 % (ref 36.0–46.0)
Hemoglobin: 13.1 g/dL (ref 12.0–15.0)
Immature Granulocytes: 1 %
Lymphocytes Relative: 13 %
Lymphs Abs: 1 10*3/uL (ref 0.7–4.0)
MCH: 29.4 pg (ref 26.0–34.0)
MCHC: 31.7 g/dL (ref 30.0–36.0)
MCV: 92.8 fL (ref 80.0–100.0)
MONO ABS: 0.6 10*3/uL (ref 0.1–1.0)
MONOS PCT: 8 %
NEUTROS PCT: 74 %
Neutro Abs: 5.6 10*3/uL (ref 1.7–7.7)
PLATELETS: 243 10*3/uL (ref 150–400)
RBC: 4.45 MIL/uL (ref 3.87–5.11)
RDW: 13.9 % (ref 11.5–15.5)
WBC: 7.5 10*3/uL (ref 4.0–10.5)
nRBC: 0 % (ref 0.0–0.2)

## 2018-05-26 LAB — COMPREHENSIVE METABOLIC PANEL
ALBUMIN: 3.8 g/dL (ref 3.5–5.0)
ALK PHOS: 55 U/L (ref 38–126)
ALT: 24 U/L (ref 0–44)
ANION GAP: 9 (ref 5–15)
AST: 24 U/L (ref 15–41)
BILIRUBIN TOTAL: 0.5 mg/dL (ref 0.3–1.2)
BUN: 17 mg/dL (ref 8–23)
CALCIUM: 8.5 mg/dL — AB (ref 8.9–10.3)
CO2: 28 mmol/L (ref 22–32)
Chloride: 100 mmol/L (ref 98–111)
Creatinine, Ser: 0.87 mg/dL (ref 0.44–1.00)
GFR calc Af Amer: >60 mL/min (ref >60)
GFR calc non Af Amer: >60 mL/min (ref >60)
GLUCOSE: 158 mg/dL — AB (ref 70–99)
Potassium: 3.6 mmol/L (ref 3.5–5.1)
Sodium: 137 mmol/L (ref 135–145)
TOTAL PROTEIN: 6.6 g/dL (ref 6.5–8.1)

## 2018-05-26 MED ORDER — TRASTUZUMAB CHEMO 150 MG IV SOLR
450.0000 mg | Freq: Once | INTRAVENOUS | Status: AC
  Administered 2018-05-26: 450 mg via INTRAVENOUS
  Filled 2018-05-26: qty 21.43

## 2018-05-26 MED ORDER — DIPHENHYDRAMINE HCL 50 MG/ML IJ SOLN
12.5000 mg | Freq: Once | INTRAMUSCULAR | Status: AC
  Administered 2018-05-26: 12.5 mg via INTRAVENOUS
  Filled 2018-05-26: qty 1

## 2018-05-26 MED ORDER — HEPARIN SOD (PORK) LOCK FLUSH 100 UNIT/ML IV SOLN
500.0000 [IU] | Freq: Once | INTRAVENOUS | Status: AC | PRN
  Administered 2018-05-26: 500 [IU]

## 2018-05-26 MED ORDER — SODIUM CHLORIDE 0.9 % IV SOLN
420.0000 mg | Freq: Once | INTRAVENOUS | Status: AC
  Administered 2018-05-26: 420 mg via INTRAVENOUS
  Filled 2018-05-26: qty 14

## 2018-05-26 MED ORDER — ACETAMINOPHEN 325 MG PO TABS
650.0000 mg | ORAL_TABLET | Freq: Once | ORAL | Status: AC
  Administered 2018-05-26: 650 mg via ORAL
  Filled 2018-05-26: qty 2

## 2018-05-26 MED ORDER — SODIUM CHLORIDE 0.9 % IV SOLN
Freq: Once | INTRAVENOUS | Status: AC
  Administered 2018-05-26: 09:00:00 via INTRAVENOUS
  Filled 2018-05-26: qty 250

## 2018-05-26 NOTE — Assessment & Plan Note (Addendum)
#  Metastatic breast cancer ER [name] HER-2/neu positive-SEP 16th 2019 CT scan NED; STABLE; on Herceptin/perjeta maintenance.  # Continue Herceptin/perjeta; labs adequate.  #Chronic dementia/debility- STABLE s/p PT.   # left ear hearing loss-? Neuro-vascular s/p Dr.Vaught ? HBO- evaluation; ? MRI; left message for Dr.Vaught to discuss.  Okay with oncology standpoint with hyperbaric oxygen if needed.  # chronic hip/back  pain- STABLE; continue tramadol prn;   DISPOSITION:  # treatment today; # follow up in 3 weeks-MD labs/ cbc/cmp-Herceptin-Perjeta- Dr.B  Addendum: Discussed with Dr. Pryor Ochoa; agrees with MRI of the brain.  This will be ordered ASAP.

## 2018-05-26 NOTE — Progress Notes (Signed)
[facility] OFFICE PROGRESS NOTE  Patient Care Team: [name], [name], MD (General Surgery) Requested, [name]   Oncology History   # June 2016- LEFT BREAST CA;  [name] ; [name] ; [name]; Her 2 Neu POS; [place] from July OF 2016; [name]; adjuvant Herceptin [ Finished July 2017]; AUG 2017- Neratinib x5 days; DISCON sec to diarrhea  # MID OCT 2018- Right breast mass-Bx- ER/PR-NEG; Her 2 NEU POSITIVE 1~2.5cm;  [?NEW primary]  # MID-OCT 2018-METASTATIC RECURRENT-oh sternal mass; Left Ax LN [Bx]/periportal LN  # OCT 25th 2018- TAXOL-HERCEPTIN-PERJETA; Jan 2019- CT PR; continue HP only.    -------------------------------------------------------------------------------   # MUGA scan- July 28th 2017- 67%.  # chronic gait/balance issues  # June 2017- left breast Bx- fat necrosis [[name]  # july 2017-  BRCA 1& 2- NEG.   MOLECULAR TESTING- F ONE- TPS- 0%; pending; ERB2 amplification; PI3K/RET amplification Others**  --------------------------------------------------    DIAGNOSIS: [ OCT 2018]- REC/MET- BREAST CA ER/PR-NEG; her 2 POS  STAGE: 4       ;GOALS: Palliative  CURRENT/MOST RECENT THERAPY- Herceptin-Perjeta      Carcinoma of overlapping sites of left breast in female, estrogen receptor negative ([place])      INTERVAL HISTORY:  [name] 77 y.o.  female pleasant patient above history of metastatic breast cancer on Herceptin plus Perjeta is here for follow-up.  Given the recent left ear hearing loss-suspected neurovascular recommended hyperbaric oxygen.  Denies any new onset of other neurologic deficits.  Hip pain chronic stable.  Not any worse.  On tramadol as needed.  No falls.  Review of Systems  Constitutional: Negative for chills, diaphoresis, fever,  malaise/fatigue and weight loss.  HENT: Positive for hearing loss (Left-sided.). Negative for nosebleeds and sore throat.   Eyes: Negative for double vision.  Respiratory: Negative for cough, hemoptysis, sputum production, shortness of breath and wheezing.   Cardiovascular: Negative for chest pain, palpitations, orthopnea and leg swelling.  Gastrointestinal: Negative for abdominal pain, blood in stool, constipation, diarrhea, heartburn, melena, nausea and vomiting.  Genitourinary: Negative for dysuria, frequency and urgency.  Musculoskeletal: Positive for back pain and joint pain.  Skin: Negative.  Negative for itching and rash.  Neurological: Negative for dizziness, tingling, focal weakness, weakness and headaches.  Endo/Heme/Allergies: Does not bruise/bleed easily.  Psychiatric/Behavioral: Positive for memory loss. Negative for depression. The patient is not nervous/anxious and does not have insomnia.       PAST MEDICAL HISTORY :  Past Medical History:  Diagnosis Date  . Arthritis   . Brain tumor ([place]) 1995   meningeoma  . Breast cancer ([place]) 2016   left breast; surgery 01/11/15 with Dr. Bary Castilla; path with invasive mammary and DCIS  . Breast cancer of upper-inner quadrant of left female breast ([place]) 12/15/14   Completed radiation end of December and finished chemotherapy 2 weeks ago, Left breast invasive mammary carcinoma, T1cN71mc (1.5 cm); Grade 3, [name], [name], [name], .  .Marland KitchenCataract    bilat   . DDD (degenerative disc disease), lumbar    Lumbar, previously evaluated by Dr. HEarnestine Leys . Dementia ([place]   . Fibrocystic breast disease    prior biopsy  . Glaucoma   . Hard of hearing    wears hearing aides  bilat   . Hearing loss   . History of cancer chemotherapy   . History of radiation therapy   . Hyperlipidemia, unspecified   . Imbalance   . Memory impairment    seen by Dr Manuella Ghazi  possible post crainiotomy from radiation  .  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  . Numbness and tingling    right hand   . Osteoarthritis   . Osteoporosis, post-menopausal   . Personal history of chemotherapy   . Personal history of radiation therapy   . Shoulder pain, right   . Wears glasses     PAST SURGICAL HISTORY :   Past Surgical History:  Procedure Laterality Date  . APPENDECTOMY  1950  . BRAIN SURGERY  1995   left frontal/temporal  . BREAST BIOPSY Left 1997  . BREAST BIOPSY Left 12/15/14   confirmed DCIS  . BREAST BIOPSY Left 01/08/2015   Procedure: BREAST BIOPSY WITH NEEDLE LOCALIZATION;  Surgeon: [name], MD;  Location: [facility] ORS;  Service: General;  Laterality: Left;  . BREAST LUMPECTOMY Left 01/08/2015   Procedure: LUMPECTOMY;  Surgeon: [name], MD;  Location: [facility] ORS;  Service: General;  Laterality: Left;  . COLONOSCOPY  2010   Dr. [name]  . ORIF ANKLE FRACTURE Right 08/05/2017   Procedure: OPEN REDUCTION INTERNAL FIXATION (ORIF) ANKLE FRACTURE;  Surgeon: Earnestine Leys, MD;  Location: [facility] ORS;  Service: Orthopedics;  Laterality: Right;  . PORTACATH PLACEMENT Right 01/16/2015   Procedure: INSERTION PORT-A-CATH;  Surgeon: [name], MD;  Location: [facility] ORS;  Service: General;  Laterality: Right;  . SENTINEL NODE BIOPSY Left 01/16/2015   Procedure: SENTINEL NODE BIOPSY;  Surgeon: [name], MD;  Location: [facility] OR[facility];  Service: General;  Laterality: Left;  . TOTAL HIP ARTHROPLASTY Right 09/04/2015   Procedure: RIGHT TOTAL HIP ARTHROPLASTY ANTERIOR APPROACH;  Surgeon: [name], MD;  Location: [facility];  Service: Orthopedics;  Laterality: Right;    FAMILY HISTORY :   Family History  Problem Relation Age of Onset  . Breast cancer Sister 60  . Addison's disease Mother   . Hyperthyroidism Mother   . Osteoarthritis Mother   . Stroke Father   . Heart disease Father   . High blood pressure Father      SOCIAL HISTORY:   Social History   Tobacco Use  . Smoking status: Former Smoker    Packs/day: 0.25    Years: 5.00    Pack years: 1.25    Types: Cigarettes    Last attempt to quit: 07/28/1972    Years since quitting: 45.8  . Smokeless tobacco: Never Used  Substance Use Topics  . Alcohol use: Yes    Alcohol/week: 1.0 - 2.0 standard drinks    Types: 1 - 2 Glasses of wine per week    Comment: 1 Glass Wine / Night  . Drug use: No    ALLERGIES:  is allergic to donepezil.  MEDICATIONS:  Current Outpatient Medications  Medication Sig Dispense Refill  . acetaminophen (TYLENOL) 325 MG tablet Take 650 mg by mouth every 4 (four) hours as needed. Pain / increased temp.    Marland Kitchen aspirin 325 MG EC tablet Take 325 mg by mouth 2 (two) times daily.     . Calcium Carbonate-Vit D-Min (CALTRATE 600+D PLUS MINERALS) 600-800 MG-UNIT TABS Take 1 tablet by mouth every morning.     . Calcium Carbonate-Vitamin D (CALTRATE 600+D PO)  Take 1 tablet by mouth daily.    . celecoxib (CELEBREX) 200 MG capsule Take 200 mg by mouth 2 (two) times daily.    . Cholecalciferol (VITAMIN D-3) 5000 units TABS Take 1 tablet by mouth daily.    Marland Kitchen levETIRAcetam (KEPPRA) 250 MG tablet 1 tablet daily at bedtime  5  . loratadine (CLARITIN) 10 MG tablet Take 10 mg by mouth daily as needed for allergies.     Marland Kitchen prochlorperazine (COMPAZINE) 10 MG tablet Take 1 tablet (10 mg total) every 6 (six) hours as needed by mouth for nausea or vomiting. 40 tablet 1  . traMADol (ULTRAM) 50 MG tablet Take 1 tablet (50 mg total) by mouth 2 (two) times daily as needed for moderate pain. 60 tablet 0  . vitamin C (ASCORBIC ACID) 250 MG tablet Take 250 mg by mouth daily.    . vitamin E 400 UNIT capsule Take 400 Units by mouth daily.     . bisacodyl (DULCOLAX) 10 MG suppository Place 1 suppository (10 mg total) rectally daily as needed for moderate constipation. (Patient not taking: Reported on 05/26/2018) 12 suppository 0   No current  facility-administered medications for this visit.    Facility-Administered Medications Ordered in Other Visits  Medication Dose Route Frequency Provider Last Rate Last Dose  . pertuzumab (PERJETA) 420 mg in sodium chloride 0.9 % 250 mL chemo infusion  420 mg Intravenous Once Charlaine Dalton R, MD      . trastuzumab (HERCEPTIN) 450 mg in sodium chloride 0.9 % 250 mL chemo infusion  450 mg Intravenous Once Cammie Sickle, MD 542.9 mL/hr at 05/26/18 0948 450 mg at 05/26/18 0948    PHYSICAL EXAMINATION: ECOG PERFORMANCE STATUS: 1 - Symptomatic but completely ambulatory  BP 129/76 (BP Location: Left Arm, Patient Position: Sitting)   Pulse 76   Temp 98.1 F (36.7 C) (Tympanic)   Resp 16   Wt 164 lb (74.4 kg)   BMI 29.05 kg/m   Filed Weights   05/26/18 0842  Weight: 164 lb (74.4 kg)    Physical Exam  Constitutional: She is oriented to person, place, and time and well-developed, well-nourished, and in no distress.  In wheel chair/ sec to gait instability; with husband.   HENT:  Head: Normocephalic and atraumatic.  Mouth/Throat: Oropharynx is clear and moist. No oropharyngeal exudate.  Eyes: Pupils are equal, round, and reactive to light.  Neck: Normal range of motion. Neck supple.  Cardiovascular: Normal rate and regular rhythm.  Pulmonary/Chest: No respiratory distress. She has no wheezes.  Abdominal: Soft. Bowel sounds are normal. She exhibits no distension and no mass. There is no tenderness. There is no rebound and no guarding.  Musculoskeletal: Normal range of motion. She exhibits no edema or tenderness.  Neurological: She is alert and oriented to person, place, and time.  Skin: Skin is warm.  Psychiatric: Affect normal.       LABORATORY DATA:  I have reviewed the data as listed    Component Value Date/Time   NA 137 05/26/2018 0813   K 3.6 05/26/2018 0813   CL 100 05/26/2018 0813   CO2 28 05/26/2018 0813   GLUCOSE 158 (H) 05/26/2018 0813   BUN 17  05/26/2018 0813   CREATININE 0.87 05/26/2018 0813   CALCIUM 8.5 (L) 05/26/2018 0813   PROT 6.6 05/26/2018 0813   ALBUMIN 3.8 05/26/2018 0813   AST 24 05/26/2018 0813   ALT 24 05/26/2018 0813   ALKPHOS 55 05/26/2018 0813   BILITOT 0.5 05/26/2018  0813   GFRNONAA >60 05/26/2018 0813   GFRAA >60 05/26/2018 0813    No results found for: SPEP, UPEP  Lab Results  Component Value Date   WBC 7.5 05/26/2018   NEUTROABS 5.6 05/26/2018   HGB 13.1 05/26/2018   HCT 41.3 05/26/2018   MCV 92.8 05/26/2018   PLT 243 05/26/2018      Chemistry      Component Value Date/Time   NA 137 05/26/2018 0813   K 3.6 05/26/2018 0813   CL 100 05/26/2018 0813   CO2 28 05/26/2018 0813   BUN 17 05/26/2018 0813   CREATININE 0.87 05/26/2018 0813      Component Value Date/Time   CALCIUM 8.5 (L) 05/26/2018 0813   ALKPHOS 55 05/26/2018 0813   AST 24 05/26/2018 0813   ALT 24 05/26/2018 0813   BILITOT 0.5 05/26/2018 0813       RADIOGRAPHIC STUDIES: I have personally reviewed the radiological images as listed and agreed with the findings in the report. No results found.   ASSESSMENT & PLAN:  Carcinoma of overlapping sites of left breast in female, estrogen receptor negative ([place]) #Metastatic breast cancer ER [name] HER-2/neu positive-SEP 16th 2019 CT scan NED; STABLE; on Herceptin/perjeta maintenance.  # Continue Herceptin/perjeta; labs adequate.  #Chronic dementia/debility- STABLE s/p PT.   # left ear hearing loss-? Neuro-vascular s/p Dr.Vaught ? HBO- evaluation; ? MRI; left message for Dr.Vaught to discuss.  Okay with oncology standpoint with hyperbaric oxygen if needed.  # chronic hip/back  pain- STABLE; continue tramadol prn;   DISPOSITION:  # treatment today; # follow up in 3 weeks-MD labs/ cbc/cmp-Herceptin-Perjeta- Dr.B  Addendum: Discussed with Dr. Pryor Ochoa; agrees with MRI of the brain.  This will be ordered ASAP.   Orders Placed This Encounter  Procedures  . MR Brain W Wo  Contrast    Standing Status:   Future    Standing Expiration Date:   05/26/2019    Order Specific Question:   ** REASON FOR EXAM (FREE TEXT)    Answer:   Metastatic breast cancer; left-sided hearing loss    Order Specific Question:   If indicated for the ordered procedure, I authorize the administration of contrast media per Radiology protocol    Answer:   Yes    Order Specific Question:   What is the patient's sedation requirement?    Answer:   No Sedation    Order Specific Question:   Does the patient have a pacemaker or implanted devices?    Answer:   No    Order Specific Question:   Use SRS Protocol?    Answer:   No    Order Specific Question:   Radiology Contrast Protocol - do NOT remove file path    Answer:   [email or web address]    Order Specific Question:   Preferred imaging location?    Answer:   [facility] (table limit-300lbs)   All questions were answered. The patient knows to call the clinic with any problems, questions or concerns.      Cammie Sickle, MD 05/26/2018 10:09 AM

## 2018-05-26 NOTE — Progress Notes (Signed)
Hearing loss in left ear x2weeks ago.

## 2018-05-26 NOTE — Telephone Encounter (Signed)
collete- Please schedule MRI of the brain with and without contrast ASAP; this has been ordered.  Please inform husband.  Thank you

## 2018-05-28 ENCOUNTER — Ambulatory Visit
Admission: RE | Admit: 2018-05-28 | Discharge: 2018-05-28 | Disposition: A | Discharge status: Home / Self Care | Payer: Medicare Other | Source: Ambulatory Visit | Attending: Internal Medicine | Admitting: Internal Medicine

## 2018-05-28 DIAGNOSIS — H9042 Sensorineural hearing loss, unilateral, left ear, with unrestricted hearing on the contralateral side: Secondary | ICD-10-CM | POA: Diagnosis not present

## 2018-05-28 DIAGNOSIS — Z171 Estrogen receptor negative status [ER-]: Secondary | ICD-10-CM | POA: Diagnosis not present

## 2018-05-28 DIAGNOSIS — IMO0001 Reserved for inherently not codable concepts without codable children: Secondary | ICD-10-CM

## 2018-05-28 DIAGNOSIS — R260 Ataxic gait: Secondary | ICD-10-CM | POA: Diagnosis not present

## 2018-05-28 DIAGNOSIS — C50812 Malignant neoplasm of overlapping sites of left female breast: Secondary | ICD-10-CM | POA: Insufficient documentation

## 2018-05-28 MED ORDER — GADOBUTROL 1 MMOL/ML IV SOLN
7.5000 mL | Freq: Once | INTRAVENOUS | Status: AC | PRN
  Administered 2018-05-28: 7.5 mL via INTRAVENOUS

## 2018-05-29 NOTE — Progress Notes (Addendum)
[name], [name] ([id number]) Visit Report for 05/24/2018 Chief Complaint Document Details Patient Name: [name], [name]. Date of Service: 05/24/2018 10:30 AM Medical Record Number: [id number] Patient Account Number: [id number] Date of Birth/Sex: 07-01-1941 (77 y.o. F) Treating RN: Cornell Barman Primary Care Provider: Ramonita Lab Other Clinician: Referring Provider: Ramonita Lab Treating Provider/Extender: Melburn Hake, HOYT Weeks in Treatment: 0 Information Obtained from: Patient Chief Complaint Left sudden sensorineural hearing loss Electronic Signature(s) Signed: 05/27/2018 5:47:39 PM By: Worthy Keeler PA-C Entered By: Worthy Keeler on 05/27/2018 17:35:14 [name] ([id number]) -------------------------------------------------------------------------------- HPI Details Patient Name: [name]. Date of Service: 05/24/2018 10:30 AM Medical Record Number: [id number] Patient Account Number: [id number] Date of Birth/Sex: [birth year] (77 y.o. F) Treating RN: Cornell Barman Primary Care Provider: Ramonita Lab Other Clinician: Referring Provider: Ramonita Lab Treating Provider/Extender: Melburn Hake, HOYT Weeks in Treatment: 0 History of Present Illness HPI Description: 05/24/18 patient presents today with her husband as a referral from Dr. Pryor Ochoa at the [facility]. 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 She has had mild progressive hearing loss bilaterally to which she was responding well to hearing aids. However it was upon waking first thing in the morning October 8th that it was noted that she had no hearing whatsoever in the left ear which prompted the visit with the specialist. This is suspected according to the reviewed note of being more vascular in nature at this point. One week later when the  patient was seen on May 11, 2018 for follow-up she had completed the six-day prednisone dose taper as directed. Unfortunately there was no response and in fact her hearing in the left ear was worse. She according to the note continued to experience complete hearing loss on the left side with significant disease when compared to the previous audiogram. This was definitely a case of sudden hearing loss as she went to bed one night okay and subsequently woke up the next morning not able to hear out of her left ear. Subsequently the patient was placed back on a 10 mg prednisone dose taper for a total of 12 days in order to see if this would be of benefit with a more prolonged period of steroid therapy. They tell me today that this is really made no difference in her hearing in regard to the left ear. Therefore Dr. Pryor Ochoa referred her to our center for consideration of hyperbaric oxygen therapy. The patient has a long-standing history with cancer. She actually had brain cancer 25 years ago according to her husband. At this point in time today it was noted that she is currently under the care of the cancer center at this point due to breast cancer which is not necessarily active at this point but also not in complete remission. Her husband states that the progression of the breast cancer seems to be "neutral" not progressing nor regressing. She does have a history of Alzheimer's according to history although this appears to be mild at this point. 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 Other than that she seems to be doing fairly well in general with regard to her history. She is a former smoker who quit in 1972 and she drinks roughly one alcoholic beverage per day at most. She has no history  of significant heart disease or lung disease according to her husband. Electronic Signature(s) Signed: 05/27/2018 5:47:39 PM By: Worthy Keeler  PA-C Entered By: Worthy Keeler on 05/27/2018 17:42:36 [name] ([id number]) -------------------------------------------------------------------------------- Physical Exam Details Patient Name: [name]. Date of Service: 05/24/2018 10:30 AM Medical Record Number: [id number] Patient Account Number: [id number] Date of Birth/Sex: Apr 10, 1941 (77 y.o. F) Treating RN: Cornell Barman Primary Care Provider: Ramonita Lab Other Clinician: Referring Provider: Ramonita Lab Treating Provider/Extender: Melburn Hake, HOYT Weeks in Treatment: 0 Constitutional sitting or standing blood pressure is within target range for patient.. pulse regular and within target range for patient.Marland Kitchen respirations regular, non-labored and within target range for patient.Marland Kitchen temperature within target range for patient.. Well- nourished and well-hydrated in no acute distress. Eyes conjunctiva clear no eyelid edema noted. pupils equal round and reactive to light and accommodation. Ears, Nose, Mouth, and Throat no gross abnormality of ear auricles or external auditory canals. patient has hearing loss bilateral with acute and complete hearing loss noted of the left ear 05/04/18. mucus membranes moist. Respiratory normal breathing without difficulty. clear to auscultation bilaterally. Cardiovascular regular rate and rhythm with normal S1, S2. no clubbing, cyanosis, significant edema, <3 sec cap refill. Gastrointestinal (GI) soft, non-tender, non-distended, +BS. no ventral hernia noted. Musculoskeletal normal gait and posture. no significant deformity or arthritic changes, no loss or range of motion, no clubbing. Psychiatric this patient is able to make decisions and demonstrates good insight into disease process. Alert and Oriented x 3. pleasant and cooperative. Notes Upon visual inspection with the otoscope I did not see any visual abnormalities such as a conductive hearing loss issue represented by waxy buildup or  otherwise. She was able to converse with me well utilizing her hearing aid in the right ear. With that being said when she remove this for hearing was completely gone and she was not able to participate in the conversation at all. Even with a hearing aid however she cannot hear out of the left ear at this point. Electronic Signature(s) Signed: 05/27/2018 5:47:39 PM By: Worthy Keeler PA-C Entered By: Worthy Keeler on 05/27/2018 17:44:14 [name] ([id number]) -------------------------------------------------------------------------------- Physician Orders Details Patient Name: [name]. Date of Service: 05/24/2018 10:30 AM Medical Record Number: [id number] Patient Account Number: [id number] Date of Birth/Sex: March 22, 1941 (77 y.o. 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 C50.812 Malignant neoplasm of overlapping sites of left female breast Hyperbaric Oxygen Therapy o Evaluate for HBO Therapy o Indication: - Sudden acute sensorineural hearing loss of the left ear o If appropriate for treatment, begin HBOT per protocol: o 2.0 ATA for 90 Minutes with 2 Five (5) Minute Air Breaks o One treatment per day (delivered Monday through Friday unless otherwise specified in Special Instructions below): o Total # of Treatments: - 20 Electronic Signature(s) Signed: 06/28/2018 9:37:41 AM By: Worthy Keeler PA-C Previous Signature: 06/01/2018 5:04:18 PM Version By: Worthy Keeler PA-C Entered By: Worthy Keeler on  06/28/2018 09:36:24 [name] ([id number]) -------------------------------------------------------------------------------- Problem List Details Patient Name: [name]. Date of Service: 05/24/2018 10:30 AM Medical Record Number: [id number] Patient  Account Number: [id number] Date of Birth/Sex: 05-29-1941 (77 y.o. 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 Date of Service: 05/24/2018 10:30 AM Medical Record Number: [id number] Patient Account Number: [id number] Date of Birth/Sex: 02/10/1941 (77 y.o. F) Treating RN: Cornell Barman Primary Care Provider: Ramonita Lab Other Clinician: Referring Provider: Ramonita Lab Treating Provider/Extender: Melburn Hake, HOYT Weeks in Treatment: 0 Subjective Chief Complaint Information obtained from Patient Left sudden sensorineural hearing loss History of Present Illness (HPI) 05/24/18 patient presents today with her husband as a referral from Dr. Pryor Ochoa at the [facility]. 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 She has had mild progressive hearing loss bilaterally to which she was responding well to hearing aids. However it was upon waking first thing in the morning October 8th that it was noted that she had no hearing whatsoever in the left ear which prompted the visit with the specialist. This is suspected according to the reviewed note of being more vascular in nature at this point. One week later when the patient was seen on May 11, 2018 for follow-up she had completed the six-day prednisone dose taper as directed. Unfortunately there was no response and in fact her hearing in the left ear was worse. She according to the note continued to experience complete hearing loss on the left side with significant disease when compared to the previous audiogram. This was definitely a case of sudden hearing loss as she went to bed one night okay and subsequently woke up the next morning not able to hear out of her left ear. Subsequently the patient was placed back on a 10 mg prednisone dose taper for a total of 12 days in order to see if this would be of benefit with a more prolonged period of steroid therapy. They tell me today that this is really made no difference in her hearing in regard to the left ear. Therefore Dr. Pryor Ochoa referred her to our center for consideration of hyperbaric oxygen therapy. The patient has a long-standing history with cancer. She actually had brain cancer 25 years ago according to her husband. At this point in time today it was noted that she is currently under the care of the cancer center at this point due to breast cancer which is not necessarily active at this point but also not in complete remission. Her husband states that the progression of the breast cancer seems to be "neutral" not progressing  nor regressing. She does have a history of Alzheimer's according to history although this appears to be mild at this point. Subsequently the patient does have a history of a meningioma which affected  the sixth cranial nerve. Other than that she seems to be doing fairly well in general with regard to her history. She is a former smoker who quit in 1972 and she drinks roughly one alcoholic beverage per day at most. She has no history of significant heart disease or lung disease according to her husband. Wound History Patient reportedly has not tested positive for osteomyelitis. Patient reportedly has not had testing performed to evaluate circulation in the legs. Patient History Information obtained from Patient, Caregiver. Allergies No Known Drug Allergies Family History Cancer - Siblings, Heart Disease - Father, Hypertension - Father, Stroke - Father, No family history of Diabetes, Hereditary Spherocytosis, Kidney Disease, Lung Disease, Seizures, Thyroid Problems, Tuberculosis. [name], [name] ([id number]) Social History Former smoker - quit many years ago, Marital Status - Married, Alcohol Use - Daily - 1 glass of wine every night, Drug Use - No History, Caffeine Use - Never. Medical History Eyes Denies history of Cataracts, Glaucoma, Optic Neuritis Ear/Nose/Mouth/Throat Denies history of Chronic sinus problems/congestion, Middle ear problems Hematologic/Lymphatic Denies history of Anemia, Hemophilia, Human Immunodeficiency Virus, Lymphedema, Sickle Cell Disease Respiratory Denies history of Aspiration, Asthma, Chronic Obstructive Pulmonary Disease (COPD), Pneumothorax, Sleep Apnea, Tuberculosis Cardiovascular Denies history of Angina, Arrhythmia, Congestive Heart Failure, Coronary Artery Disease, Deep Vein Thrombosis, Hypertension, Hypotension, Myocardial Infarction, Peripheral Arterial Disease, Peripheral Venous Disease, Phlebitis, Vasculitis Gastrointestinal Denies history of  Cirrhosis , Colitis, Crohn s, Hepatitis A, Hepatitis B, Hepatitis C Endocrine Denies history of Type I Diabetes, Type II Diabetes Genitourinary Denies history of End Stage Renal Disease Immunological Denies history of Lupus Erythematosus, Raynaud s, Scleroderma Integumentary (Skin) Denies history of History of Burn, History of pressure wounds Musculoskeletal Patient has history of Osteoarthritis Denies history of Gout, Rheumatoid Arthritis, Osteomyelitis Neurologic Denies history of Dementia, Neuropathy, Quadriplegia, Paraplegia, Seizure Disorder Oncologic Patient has history of Received Chemotherapy, Received Radiation Psychiatric Denies history of Anorexia/bulimia, Confinement Anxiety Medical And Surgical History Notes Eyes sudden hearing loss Ear/Nose/Mouth/Throat sudden hearing loss Musculoskeletal OP, spinal stenosis Neurologic Alzheimers Oncologic currently receiving chemotherapy Review of Systems (ROS) Constitutional Symptoms (General Health) Denies complaints or symptoms of Fatigue, Fever, Chills, Marked Weight Change. Eyes Complains or has symptoms of Glasses / Contacts - glasses. Denies complaints or symptoms of Dry Eyes, Vision Changes. Ear/Nose/Mouth/Throat Denies complaints or symptoms of Difficult clearing ears, Sinusitis. Hematologic/Lymphatic [name], [name] ([id number]) Denies complaints or symptoms of Bleeding / Clotting Disorders, Human Immunodeficiency Virus. Respiratory Denies complaints or symptoms of Chronic or frequent coughs, Shortness of Breath. Cardiovascular Denies complaints or symptoms of Chest pain, LE edema. Gastrointestinal Denies complaints or symptoms of Frequent diarrhea, Nausea, Vomiting. Endocrine Denies complaints or symptoms of Hepatitis, Thyroid disease, Polydypsia (Excessive Thirst). Genitourinary Denies complaints or symptoms of Kidney failure/ Dialysis, Incontinence/dribbling. Immunological Denies complaints or symptoms  of Hives, Itching. Integumentary (Skin) Denies complaints or symptoms of Wounds, Bleeding or bruising tendency, Breakdown, Swelling. Neurologic Denies complaints or symptoms of Numbness/parasthesias, Focal/Weakness. Psychiatric Denies complaints or symptoms of Anxiety, Claustrophobia. Objective Constitutional sitting or standing blood pressure is within target range for patient.. pulse regular and within target range for patient.Marland Kitchen respirations regular, non-labored and within target range for patient.Marland Kitchen temperature within target range for patient.. Well- nourished and well-hydrated in no acute distress. Vitals Time Taken: 10:40 AM, Height: 64 in, Weight: 164 lbs, Source: Measured, BMI: 28.1, Temperature: 97.9 F, Pulse: 75 bpm, Respiratory Rate: 16 breaths/min, Blood Pressure: 126/73 mmHg. Eyes conjunctiva clear no eyelid edema noted. pupils equal round and reactive to light and accommodation. Ears,  Nose, Mouth, and Throat no gross abnormality of ear auricles or external auditory canals. patient has hearing loss bilateral with acute and complete hearing loss noted of the left ear 05/04/18. mucus membranes moist. Respiratory normal breathing without difficulty. clear to auscultation bilaterally. Cardiovascular regular rate and rhythm with normal S1, S2. no clubbing, cyanosis, significant edema, Gastrointestinal (GI) soft, non-tender, non-distended, +BS. no ventral hernia noted. Musculoskeletal normal gait and posture. no significant deformity or arthritic changes, no loss or range of motion, no clubbing. Psychiatric this patient is able to make decisions and demonstrates good insight into disease process. Alert and Oriented x 3. pleasant and cooperative. [name], [name] ([id number]) General Notes: Upon visual inspection with the otoscope I did not see any visual abnormalities such as a conductive hearing loss issue represented by waxy buildup or otherwise. She was able to converse with  me well utilizing her hearing aid in the right ear. With that being said when she remove this for hearing was completely gone and she was not able to participate in the conversation at all. Even with a hearing aid however she cannot hear out of the left ear at this point. Assessment Active Problems ICD-10 Sudden idiopathic hearing loss, left ear Sensorineural hearing loss, unilateral, left ear, with restricted hearing on the contralateral side Secondary and unspecified malignant neoplasm of axilla and upper limb lymph nodes Malignant neoplasm of overlapping sites of left female breast Plan Hyperbaric Oxygen Therapy: Evaluate for HBO Therapy Indication: - Sudden acute sensorineural hearing loss of the left ear If appropriate for treatment, begin HBOT per protocol: 2.0 ATA for 90 Minutes with 2 Five (5) Minute Air Breaks One treatment per day (delivered Monday through Friday unless otherwise specified in Special Instructions below): Total # of Treatments: - 20 Currently my biggest concern was the issue with her breast cancer and whether or not the hyperbaric oxygen therapy could potentially affect this in a negative way. This was discussed with patient as well as the husband during the office visit today. With that being said I am going to suggest currently that we go ahead and proceed with checking with her oncologist to see whether or not they would have any concerns in this regard. The patient's husband is actually good friends with them and is going to get in touch with them to confirm. He actually contact her office back later in the day to discuss and let us know that they did not feel there was any concern in that regard. This obviously is good news. The last thing that we need to do is check on the insurance approval for the process. I would recommend 10 treatments 90 minutes each at 2 ATA per the sudden hearing loss protocol. We will check with insurance to see whether or not we can  obtain approval. In the meantime if anything changes the patient's husband will get in touch with [place] in that regard. We will also confirm when her last EKG and chest x-ray were to ensure everything appears okay in that regard. The patient and her husband were in agreement with plan and appreciative of the information today. We did have a lengthy conversation regarding the hyperbaric option therapy in general and how it can potentially help. They are definitely aware that this is not a 100% fixed of the situation and that there is a chance that it may help to some degree or there's also a chance that it may not help at all. Nonetheless they still would like to  proceed if everything seems okay with the oncologist which seems to be the case based on the conversation we received back. Electronic Signature(s) Signed: 06/28/2018 9:37:41 AM By: Worthy Keeler PA-C Previous Signature: 05/27/2018 5:47:39 PM Version By: [name], Michele R. ([id number]) Entered By: Worthy Keeler on 06/28/2018 09:36:34 [name] ([id number]) -------------------------------------------------------------------------------- ROS/PFSH Details Patient Name: [name]. Date of Service: 05/24/2018 10:30 AM Medical Record Number: [id number] Patient Account Number: [id number] Date of Birth/Sex: Jan 06, 1941 (77 y.o. F) Treating RN: Montey Hora Primary Care Provider: Ramonita Lab Other Clinician: Referring Provider: Ramonita Lab Treating Provider/Extender: Melburn Hake, HOYT Weeks in Treatment: 0 Information Obtained From Patient Caregiver Wound History Do you currently have one or more open woundso No Have you tested positive for osteomyelitis (bone infection)o No Have you had any tests for circulation on your legso No Constitutional Symptoms (General Health) Complaints and Symptoms: Negative for: Fatigue; Fever; Chills; Marked Weight Change Eyes Complaints and Symptoms: Positive for:  Glasses / Contacts - glasses Negative for: Dry Eyes; Vision Changes Medical History: Negative for: Cataracts; Glaucoma; Optic Neuritis Past Medical History Notes: sudden hearing loss Ear/Nose/Mouth/Throat Complaints and Symptoms: Negative for: Difficult clearing ears; Sinusitis Medical History: Negative for: Chronic sinus problems/congestion; Middle ear problems Past Medical History Notes: sudden hearing loss Hematologic/Lymphatic Complaints and Symptoms: Negative for: Bleeding / Clotting Disorders; Human Immunodeficiency Virus Medical History: Negative for: Anemia; Hemophilia; Human Immunodeficiency Virus; Lymphedema; Sickle Cell Disease Respiratory Complaints and Symptoms: Negative for: Chronic or frequent coughs; Shortness of Breath Medical History: Negative for: Aspiration; Asthma; Chronic Obstructive Pulmonary Disease (COPD); Pneumothorax; Sleep Apnea; Tuberculosis ALAYLA, DETHLEFS R. ([id number]) Cardiovascular Complaints and Symptoms: Negative for: Chest pain; LE edema Medical History: Negative for: Angina; Arrhythmia; Congestive Heart Failure; Coronary Artery Disease; Deep Vein Thrombosis; Hypertension; Hypotension; Myocardial Infarction; Peripheral Arterial Disease; Peripheral Venous Disease; Phlebitis; Vasculitis Gastrointestinal Complaints and Symptoms: Negative for: Frequent diarrhea; Nausea; Vomiting Medical History: Negative for: Cirrhosis ; Colitis; Crohnos; Hepatitis A; Hepatitis B; Hepatitis C Endocrine Complaints and Symptoms: Negative for: Hepatitis; Thyroid disease; Polydypsia (Excessive Thirst) Medical History: Negative for: Type I Diabetes; Type II Diabetes Genitourinary Complaints and Symptoms: Negative for: Kidney failure/ Dialysis; Incontinence/dribbling Medical History: Negative for: End Stage Renal Disease Immunological Complaints and Symptoms: Negative for: Hives; Itching Medical History: Negative for: Lupus Erythematosus; Raynaudos;  Scleroderma Integumentary (Skin) Complaints and Symptoms: Negative for: Wounds; Bleeding or bruising tendency; Breakdown; Swelling Medical History: Negative for: History of Burn; History of pressure wounds Neurologic Complaints and Symptoms: Negative for: Numbness/parasthesias; Focal/Weakness Medical History: Negative for: Dementia; Neuropathy; Quadriplegia; Paraplegia; Seizure Disorder Past Medical History Notes: Alzheimers [name], [name] ([id number]) Psychiatric Complaints and Symptoms: Negative for: Anxiety; Claustrophobia Medical History: Negative for: Anorexia/bulimia; Confinement Anxiety Musculoskeletal Medical History: Positive for: Osteoarthritis Negative for: Gout; Rheumatoid Arthritis; Osteomyelitis Past Medical History Notes: OP, spinal stenosis Oncologic Medical History: Positive for: Received Chemotherapy; Received Radiation Past Medical History Notes: currently receiving chemotherapy Immunizations Pneumococcal Vaccine: Received Pneumococcal Vaccination: Yes Implantable Devices Family and Social History Cancer: Yes - Siblings; Diabetes: No; Heart Disease: Yes - Father; Hereditary Spherocytosis: No; Hypertension: Yes - Father; Kidney Disease: No; Lung Disease: No; Seizures: No; Stroke: Yes - Father; Thyroid Problems: No; Tuberculosis: No; Former smoker - quit many years ago; Marital Status - Married; Alcohol Use: Daily - 1 glass of wine every night; Drug Use: No History; Caffeine Use: Never; Financial Concerns: No; Food, Clothing or Shelter Needs: No; Support System Lacking: No; Transportation Concerns: No; Living Will: Yes; Medical Power of  Attorney: Yes - spouse Electronic Signature(s) Signed: 05/24/2018 5:02:27 PM By: Montey Hora Signed: 05/27/2018 5:47:39 PM By: Worthy Keeler PA-C Entered By: Montey Hora on 05/24/2018 10:53:29 [name] ([id number]) -------------------------------------------------------------------------------- SuperBill  Details Patient Name: [name]. Date of Service: 05/24/2018 Medical Record Number: [id number] Patient Account Number: [id number] Date of Birth/Sex: [birth year] (77 y.o. 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

## 2018-05-31 ENCOUNTER — Telehealth: Payer: Self-pay | Admitting: *Deleted

## 2018-05-31 ENCOUNTER — Telehealth: Payer: Self-pay | Admitting: Internal Medicine

## 2018-05-31 NOTE — Telephone Encounter (Signed)
Lorie-it is okay to fax the results of the MRI to the wound center.  Thank you

## 2018-05-31 NOTE — Telephone Encounter (Signed)
Patient's husband called requesting that results to MRI be faxed to [facility] so that she can begin hyperbolic treatment.

## 2018-05-31 NOTE — Telephone Encounter (Signed)
Results have been faxed

## 2018-05-31 NOTE — Telephone Encounter (Signed)
Mychart message sent.

## 2018-06-02 ENCOUNTER — Encounter: Payer: Medicare Other | Attending: Internal Medicine | Admitting: Internal Medicine

## 2018-06-02 DIAGNOSIS — H9122 Sudden idiopathic hearing loss, left ear: Secondary | ICD-10-CM | POA: Insufficient documentation

## 2018-06-03 ENCOUNTER — Encounter: Payer: Medicare Other | Admitting: Physician Assistant

## 2018-06-03 DIAGNOSIS — H9122 Sudden idiopathic hearing loss, left ear: Secondary | ICD-10-CM | POA: Diagnosis not present

## 2018-06-04 ENCOUNTER — Encounter: Payer: Medicare Other | Admitting: Physician Assistant

## 2018-06-04 DIAGNOSIS — H9122 Sudden idiopathic hearing loss, left ear: Secondary | ICD-10-CM | POA: Diagnosis not present

## 2018-06-04 NOTE — Progress Notes (Addendum)
SHARYON, PEITZ ([id number]) Visit Report for 06/02/2018 HBO Details Patient Name: [name], [name]. Date of Service: 06/02/2018 12:15 PM Medical Record Number: [id number] Patient Account Number: [id number] Date of Birth/Sex: November 24, 1940 (77 y.o. F) Treating RN: Cornell Barman Primary Care Everlean Bucher: Ramonita Lab Other Clinician: Jacqulyn Bath Referring Myla Mauriello: Ramonita Lab Treating Kimon Loewen/Extender: Tito Dine in Treatment: 1 HBO Treatment Course Details Treatment Course Number: 1 Ordering Kipling Graser: STONE III, HOYT Total Treatments Ordered: 10 HBO Treatment Start Date: 06/02/2018 HBO Indication: Idiopathic Sudden Sensorineural Healing Loss (ISSHL) HBO Treatment Details Treatment Number: 1 Patient Type: Outpatient Chamber Type: Monoplace Chamber Serial #: [id number] Treatment Protocol: 2.0 ATA with 90 minutes oxygen, with two 5 minute air breaks Treatment Details Compression Rate Down: 1.0 psi / minute De-Compression Rate Up: 1.5 psi / minute Compress Tx Pressure Air breaks and breathing periods Decompress Decompress Begins Reached (leave unused spaces blank) Begins Ends Chamber Pressure (ATA) 1 2 2 2 2 2  --2 1 Clock Time (24 hr) 12:58 13:17 13:47 13:52 14:22 14:27 - - 14:57 15:07 Treatment Length: 129 (minutes) Treatment Segments: 4 Capillary Blood Glucose Pre Capillary Blood Glucose (mg/dl): Post Capillary Blood Glucose (mg/dl): Vital Signs Capillary Blood Glucose Reference Range: 80 - 120 mg / dl HBO Diabetic Blood Glucose Intervention Range: <131 mg/dl or >249 mg/dl Time Vitals Blood Respiratory Capillary Blood Glucose Pulse Action Type: Pulse: Temperature: Taken: Pressure: Rate: Glucose (mg/dl): Meter #: Oximetry (%) Taken: Pre 12:35 112/68 80 16 96.3 Post 15:15 116/76 72 16 97.7 Treatment Response Treatment Completion Status: Treatment Completed without Adverse Event Treatment Notes Patient complained of slight pressure in her left ear at about 1.30 ATA.  She was able to clear her ears somewhat until 1.70 ATA. At this point she had some minor pain and pressure set was backed off to 1.70 ATA in order to level off. She felt immediate relief in her left ear and descent was continued very slowly allowing her to clear her ears more frequently. At approximately 1.90 ATA she no longer felt any more pain and pressure prescribed of 2.0 ATA was reached successfully with no more problems. Electronic Signature(s) Signed: 06/02/2018 4:36:22 PM By: Paulla Fore, RRT, CHT TYKIA, MELLONE ([id number]) Signed: 06/02/2018 4:37:14 PM By: Linton Ham MD Entered By: Lorine Bears on 06/02/2018 16:34:33 [name] ([id number]) -------------------------------------------------------------------------------- HBO Safety Checklist Details Patient Name: [name]. Date of Service: 06/02/2018 12:15 PM Medical Record Number: [id number] Patient Account Number: [id number] Date of Birth/Sex: 30-May-1941 (77 y.o. F) Treating RN: Cornell Barman Primary Care Amos Micheals: Ramonita Lab Other Clinician: Jacqulyn Bath Referring Imberly Troxler: Ramonita Lab Treating Lowell Mcgurk/Extender: Tito Dine in Treatment: 1 HBO Safety Checklist Items Safety Checklist Consent Form Signed Patient voided / foley secured and emptied When did you last eato n/a Last dose of injectable or oral agent n/a Ostomy pouch emptied and vented if applicable NA All implantable devices assessed, documented and approved NA Intravenous access site secured and place NA Valuables secured Linens and cotton and cotton/polyester blend (less than 51% polyester) Personal oil-based products / skin lotions / body lotions removed Wigs or hairpieces removed NA Smoking or tobacco materials removed NA Books / newspapers / magazines / loose paper removed Cologne, aftershave, perfume and deodorant removed Jewelry removed (may wrap wedding band) Make-up  removed Hair care products removed Battery operated devices (external) removed Heating patches and chemical warmers removed Titanium eyewear removed Nail polish cured greater than 10 hours NA Casting material cured greater than 10  hours NA Hearing aids removed Loose dentures or partials removed NA Prosthetics have been removed NA Patient demonstrates correct use of air break device (if applicable) Patient concerns have been addressed Patient grounding bracelet on and cord attached to chamber Specifics for Inpatients (complete in addition to above) Medication sheet sent with patient Intravenous medications needed or due during therapy sent with patient Drainage tubes (e.g. nasogastric tube or chest tube secured and vented) Endotracheal or Tracheotomy tube secured Cuff deflated of air and inflated with saline Airway suctioned Electronic Signature(s) Signed: 06/02/2018 4:36:22 PM By: Lorine Bears RCP, RRT, CHT [place], [place] R. ([id number]) Entered By: Lorine Bears on 06/02/2018 13:24:31

## 2018-06-04 NOTE — Progress Notes (Signed)
Michele, [name] ([id number]) Visit Report for 06/02/2018 Arrival Information Details Patient Name: [name], [name]. Date of Service: 06/02/2018 12:15 PM Medical Record Number: [id number] Patient Account Number: [id number] Date of Birth/Sex: [birth year] (77 y.o. F) Treating RN: Cornell Barman Primary Care Tiersa Dayley: Ramonita Lab Other Clinician: Jacqulyn Bath Referring Elener Custodio: Ramonita Lab Treating Arshdeep Bolger/Extender: Tito Dine in Treatment: 1 Visit Information History Since Last Visit Added or deleted any medications: No Patient Arrived: [name] Any new allergies or adverse reactions: No Arrival Time: 12:30 Had a fall or experienced change in No Accompanied By: husband activities of daily living that may affect Transfer Assistance: Manual risk of falls: Patient Identification Verified: Yes Signs or symptoms of abuse/neglect since last visito No Secondary Verification Process Completed: Yes Hospitalized since last visit: No Implantable device outside of the clinic excluding No cellular tissue based products placed in the center since last visit: Pain Present Now: No Electronic Signature(s) Signed: 06/02/2018 4:36:22 PM By: Lorine Bears RCP, RRT, CHT Entered By: Lorine Bears on 06/02/2018 13:21:29 [name] ([id number]) -------------------------------------------------------------------------------- Encounter Discharge Information Details Patient Name: [name]. Date of Service: 06/02/2018 12:15 PM Medical Record Number: [id number] Patient Account Number: [id number] Date of Birth/Sex: [birth year] (77 y.o. F) Treating RN: Cornell Barman Primary Care Alyza Artiaga: Ramonita Lab Other Clinician: Jacqulyn Bath Referring Oliverio Cho: Ramonita Lab Treating Karie Skowron/Extender: Tito Dine in Treatment: 1 Encounter Discharge Information Items Discharge Condition: Stable Ambulatory Status: Ambulatory Discharge Destination:  Home Transportation: Private Auto Accompanied By: husband Schedule Follow-up Appointment: No Clinical Summary of Care: Notes Patient has an HBO treatment scheduled on 06/03/18 at 12:15 pm. Electronic Signature(s) Signed: 06/02/2018 4:36:22 PM By: Lorine Bears RCP, RRT, CHT Entered By: Lorine Bears on 06/02/2018 16:36:03 [name] ([id number]) -------------------------------------------------------------------------------- Patient/Caregiver Education Details Patient Name: [name]. Date of Service: 06/02/2018 12:15 PM Medical Record Number: [id number] Patient Account Number: [id number] Date of Birth/Gender: 09-Feb-1941 (77 y.o. F) Treating RN: Cornell Barman Primary Care Physician: Ramonita Lab Other Clinician: Jacqulyn Bath Referring Physician: Ramonita Lab Treating Physician/Extender: Tito Dine in Treatment: 1 Education Assessment Education Provided To: Patient Education Topics Provided Hyperbaric Oxygenation: Responses: State content correctly Electronic Signature(s) Signed: 06/02/2018 4:36:22 PM By: Lorine Bears RCP, RRT, CHT Entered By: Becky Sax, Amado Nash on 06/02/2018 16:35:14 [name] ([id number]) -------------------------------------------------------------------------------- Vitals Details Patient Name: [name] Date of Service: 06/02/2018 12:15 PM Medical Record Number: [id number] Patient Account Number: [id number] Date of Birth/Sex: February 12, 1941 (77 y.o. F) Treating RN: Cornell Barman Primary Care Jamaris Theard: Ramonita Lab Other Clinician: Jacqulyn Bath Referring Inella Kuwahara: Ramonita Lab Treating Izaia Say/Extender: Tito Dine in Treatment: 1 Vital Signs Time Taken: 12:35 Temperature (F): 96.3 Height (in): 64 Pulse (bpm): 80 Weight (lbs): 164 Respiratory Rate (breaths/min): 16 Body Mass Index (BMI): 28.1 Blood Pressure (mmHg): 112/68 Reference Range: 80 - 120  mg / dl Electronic Signature(s) Signed: 06/02/2018 4:36:22 PM By: Lorine Bears RCP, RRT, CHT Entered By: Lorine Bears on 06/02/2018 13:22:41

## 2018-06-05 NOTE — Progress Notes (Signed)
CASEY, MAXFIELD ([id number]) Visit Report for 06/03/2018 Problem List Details Patient Name: [name], [name]. Date of Service: 06/03/2018 12:15 PM Medical Record Number: [id number] Patient Account Number: [id number] Date of Birth/Sex: 13-Aug-1940 (77 y.o. 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 Date of Service: 06/03/2018 Medical Record Number: [id number] Patient Account Number: [id number] Date of Birth/Sex: [birth year] (77 y.o. 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

## 2018-06-05 NOTE — Progress Notes (Signed)
[name], [place]ANG[place] ([id number]) Visit Report for 06/03/2018 HBO Details Patient Name: [name], [name]. Date of Service: 06/03/2018 12:15 PM Medical Record Number: [id number] Patient Account Number: [id number] Date of Birth/Sex: 1940/12/30 (77 y.o. F) Treating RN: [name] Primary Care [name]: [name] Other Clinician: Jacqulyn [name] Referring [name]: [name] Treating Jawaun Celmer/Extender: [name], [name] Weeks in Treatment: 1 HBO Treatment Course Details Treatment Course Number: 1 Ordering [name]: STONE III, [name] Total Treatments Ordered: 10 HBO Treatment Start Date: 06/02/2018 HBO Indication: Idiopathic Sudden Sensorineural Healing Loss (ISSHL) HBO Treatment Details Treatment Number: 2 Patient Type: Outpatient Chamber Type: Monoplace Chamber Serial #: [id number] Treatment Protocol: 2.0 ATA with 90 minutes oxygen, with two 5 minute air breaks Treatment Details Compression Rate Down: 1.0 psi / minute De-Compression Rate Up: 1.5 psi / minute Compress Tx Pressure Air breaks and breathing periods Decompress Decompress Begins Reached (leave unused spaces blank) Begins Ends Chamber Pressure (ATA) 1 2 2 2 2 2  --2 1 Clock Time (24 hr) 12:19 12:40 13:10 13:15 13:45 13:50 - - 14:22 14:32 Treatment Length: 133 (minutes) Treatment Segments: 4 Capillary Blood Glucose Pre Capillary Blood Glucose (mg/dl): Post Capillary Blood Glucose (mg/dl): Vital Signs Capillary Blood Glucose Reference Range: 80 - 120 mg / dl HBO Diabetic Blood Glucose Intervention Range: <131 mg/dl or >249 mg/dl Time Vitals Blood Respiratory Capillary Blood Glucose Pulse Action Type: Pulse: Temperature: Taken: Pressure: Rate: Glucose (mg/dl): Meter #: Oximetry (%) Taken: Pre 12:15 126/62 72 16 98.1 Post 14:40 126/62 72 16 98.1 Pre-Treatment Ear Evaluation Left Right Left Teed Scale: Grade 0 Right Teed Scale: Grade 0 Treatment Response Treatment Toleration: Well Treatment Completion Treatment  Completed without Adverse Event Status: [name] Notes Patient's peers appear to be doing very well and she seems to be tolerating HBO therapy well without complication. Overall I'm pleased with how things seem to be progressing. Post treatment there was no injury to the tympanic membranes. [name], [name] ([id number]) Post Treatment Teed Score Post Treatment Teed Score: Left Ear Grade 0 Post Treatment Teed Score: Right Ear Grade 0 HBO Attestation I certify that I supervised this HBO treatment in accordance with Medicare guidelines. A trained emergency response Yes team is readily available per hospital policies and procedures. Continue HBOT as ordered. Yes Electronic Signature(s) Signed: 06/03/2018 5:47:48 PM By: Michele Keeler PA-C Previous Signature: 06/03/2018 4:25:22 PM Version By: Lorine Bears RCP, RRT, CHT Entered By: [name] on 06/03/2018 17:24:47 [name] ([id number]) -------------------------------------------------------------------------------- HBO Safety Checklist Details Patient Name: [name]. Date of Service: 06/03/2018 12:15 PM Medical Record Number: [id number] Patient Account Number: [id number] Date of Birth/Sex: [birth year] (77 y.o. F) Treating RN: [name] Primary Care [name]: [name] Other Clinician: Jacqulyn [name] Referring [name]: [name] Treating [name]/Extender: [name], [name] Weeks in Treatment: 1 HBO Safety Checklist Items Safety Checklist Consent Form Signed Patient voided / foley secured and emptied When did you last eato n/a Last dose of injectable or oral agent n/a Ostomy pouch emptied and vented if applicable NA All implantable devices assessed, documented and approved NA Intravenous access site secured and place NA Valuables secured Linens and cotton and cotton/polyester blend (less than 51% polyester) Personal oil-based products / skin lotions / body lotions removed Wigs or  hairpieces removed NA Smoking or tobacco materials removed NA Books / newspapers / magazines / loose paper removed Cologne, aftershave, perfume and deodorant removed Jewelry removed (may wrap wedding band) Make-up removed Hair care products removed Battery operated devices (external) removed Heating  patches and chemical warmers removed Titanium eyewear removed Nail polish cured greater than 10 hours NA Casting material cured greater than 10 hours NA Hearing aids removed Loose dentures or partials removed NA Prosthetics have been removed NA Patient demonstrates correct use of air break device (if applicable) Patient concerns have been addressed Patient grounding bracelet on and cord attached to chamber Specifics for Inpatients (complete in addition to above) Medication sheet sent with patient Intravenous medications needed or due during therapy sent with patient Drainage tubes (e.g. nasogastric tube or chest tube secured and vented) Endotracheal or Tracheotomy tube secured Cuff deflated of air and inflated with saline Airway suctioned Electronic Signature(s) Signed: 06/03/2018 4:25:22 PM By: Lorine Bears RCP, RRT, CHT [place], [place] R. ([id number]) Entered By: Lorine Bears on 06/03/2018 12:45:18

## 2018-06-05 NOTE — Progress Notes (Signed)
[name], [name] ([id number]) Visit Report for 06/03/2018 Arrival Information Details Patient Name: [name], [name]. Date of Service: 06/03/2018 12:15 PM Medical Record Number: [id number] Patient Account Number: [id number] Date of Birth/Sex: [birth year] (77 y.o. F) Treating RN: Cornell Barman Primary Care Kirin Pastorino: Ramonita Lab Other Clinician: Jacqulyn Bath Referring Ikeisha Blumberg: Ramonita Lab Treating Emmersen Garraway/Extender: Melburn Hake, HOYT Weeks in Treatment: 1 Visit Information History Since Last Visit Added or deleted any medications: No Patient Arrived: Gilford Rile Any new allergies or adverse reactions: No Arrival Time: 12:42 Had a fall or experienced change in No Accompanied By: husband activities of daily living that may affect Transfer Assistance: Manual risk of falls: Patient Identification Verified: Yes Signs or symptoms of abuse/neglect since last visito No Secondary Verification Process Completed: Yes Hospitalized since last visit: No Implantable device outside of the clinic excluding No cellular tissue based products placed in the center since last visit: Pain Present Now: No Electronic Signature(s) Signed: 06/03/2018 4:25:22 PM By: Lorine Bears RCP, RRT, CHT Entered By: Lorine Bears on 06/03/2018 12:42:41 [name] ([id number]) -------------------------------------------------------------------------------- Encounter Discharge Information Details Patient Name: [name]. Date of Service: 06/03/2018 12:15 PM Medical Record Number: [id number] Patient Account Number: [id number] Date of Birth/Sex: 01-16-1941 (77 y.o. F) Treating RN: Cornell Barman Primary Care Martice Doty: Ramonita Lab Other Clinician: Jacqulyn Bath Referring Khanh Cordner: Ramonita Lab Treating Jyra Lagares/Extender: Melburn Hake, HOYT Weeks in Treatment: 1 Encounter Discharge Information Items Discharge Condition: Stable Ambulatory Status: Ambulatory Discharge Destination:  Home Transportation: Private Auto Accompanied By: husband Schedule Follow-up Appointment: No Clinical Summary of Care: Notes Patient has an HBO treatment scheduled on 06/04/18 at 12:15 pm. Electronic Signature(s) Signed: 06/03/2018 4:25:22 PM By: Lorine Bears RCP, RRT, CHT Entered By: Lorine Bears on 06/03/2018 16:25:05 [name] ([id number]) -------------------------------------------------------------------------------- Patient/Caregiver Education Details Patient Name: [name]. Date of Service: 06/03/2018 12:15 PM Medical Record Number: [id number] Patient Account Number: [id number] Date of Birth/Gender: 04-Aug-1940 (77 y.o. F) Treating RN: Cornell Barman Primary Care Physician: Ramonita Lab Other Clinician: Jacqulyn Bath Referring Physician: Ramonita Lab Treating Physician/Extender: Sharalyn Ink in Treatment: 1 Education Assessment Education Provided To: Patient Education Topics Provided Hyperbaric Oxygenation: Responses: State content correctly Electronic Signature(s) Signed: 06/03/2018 4:25:22 PM By: Lorine Bears RCP, RRT, CHT Entered By: Becky Sax, Amado Nash on 06/03/2018 16:24:30 [name] ([id number]) -------------------------------------------------------------------------------- Vitals Details Patient Name: [name] Date of Service: 06/03/2018 12:15 PM Medical Record Number: [id number] Patient Account Number: [id number] Date of Birth/Sex: Nov 02, 1940 (77 y.o. F) Treating RN: Cornell Barman Primary Care Renny Remer: Ramonita Lab Other Clinician: Jacqulyn Bath Referring Charliee Krenz: Ramonita Lab Treating Edana Aguado/Extender: Melburn Hake, HOYT Weeks in Treatment: 1 Vital Signs Time Taken: 12:15 Temperature (F): 98.1 Height (in): 64 Pulse (bpm): 72 Weight (lbs): 164 Respiratory Rate (breaths/min): 16 Body Mass Index (BMI): 28.1 Blood Pressure (mmHg): 126/62 Reference Range: 80 - 120 mg /  dl Electronic Signature(s) Signed: 06/03/2018 4:25:22 PM By: Lorine Bears RCP, RRT, CHT Entered By: Becky Sax, Amado Nash on 06/03/2018 12:46:07

## 2018-06-07 ENCOUNTER — Encounter: Payer: Medicare Other | Admitting: Physician Assistant

## 2018-06-07 DIAGNOSIS — H9122 Sudden idiopathic hearing loss, left ear: Secondary | ICD-10-CM | POA: Diagnosis not present

## 2018-06-07 NOTE — Progress Notes (Signed)
AAVA, DELAND ([id number]) Visit Report for 06/04/2018 Problem List Details Patient Name: [name], [name]. Date of Service: 06/04/2018 12:15 PM Medical Record Number: [id number] Patient Account Number: [id number] Date of Birth/Sex: 1940/12/04 (77 y.o. 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 Date of Service: 06/04/2018 Medical Record Number: [id number] Patient Account Number: [id number] Date of Birth/Sex: Apr 21, 1941 (77 y.o. 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 C50.812 Malignant neoplasm  of overlapping sites of left female breast Facility Procedures CPT4 Code: 75797282 Description: (Facility Use Only) HBOT, full body chamber, 48min Modifier: Quantity: 4 Physician Procedures CPT4: Description Modifier Quantity Code 0601561 [id number] - WC PHYS HYPERBARIC OXYGEN THERAPY 1 ICD-10 Diagnosis Description 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

## 2018-06-07 NOTE — Progress Notes (Signed)
DOSHIA, DALIA ([id number]) Visit Report for 06/04/2018 HBO Details Patient Name: [name], [name]. Date of Service: 06/04/2018 12:15 PM Medical Record Number: [id number] Patient Account Number: [id number] Date of Birth/Sex: May 06, 1941 (77 y.o. F) Treating RN: Montey Hora Primary Care Nova Schmuhl: Ramonita Lab Other Clinician: Jacqulyn Bath Referring Donisha Hoch: Ramonita Lab Treating Anwyn Kriegel/Extender: Melburn Hake, HOYT Weeks in Treatment: 1 HBO Treatment Course Details Treatment Course Number: 1 Ordering Kaysia Willard: STONE III, HOYT Total Treatments Ordered: 10 HBO Treatment Start Date: 06/02/2018 HBO Indication: Idiopathic Sudden Sensorineural Healing Loss (ISSHL) HBO Treatment Details Treatment Number: 3 Patient Type: Outpatient Chamber Type: Monoplace Chamber Serial #: [id number] Treatment Protocol: 2.0 ATA with 90 minutes oxygen, with two 5 minute air breaks Treatment Details Compression Rate Down: 1.0 psi / minute De-Compression Rate Up: 1.5 psi / minute Compress Tx Pressure Air breaks and breathing periods Decompress Decompress Begins Reached (leave unused spaces blank) Begins Ends Chamber Pressure (ATA) 1 2 2 2 2 2  --2 1 Clock Time (24 hr) 12:17 12:36 13:06 13:11 13:42 13:47 - - 14:17 14:27 Treatment Length: 130 (minutes) Treatment Segments: 4 Capillary Blood Glucose Pre Capillary Blood Glucose (mg/dl): Post Capillary Blood Glucose (mg/dl): Vital Signs Capillary Blood Glucose Reference Range: 80 - 120 mg / dl HBO Diabetic Blood Glucose Intervention Range: <131 mg/dl or >249 mg/dl Time Vitals Blood Respiratory Capillary Blood Glucose Pulse Action Type: Pulse: Temperature: Taken: Pressure: Rate: Glucose (mg/dl): Meter #: Oximetry (%) Taken: Pre 12:10 112/66 78 16 97.7 Post 14:35 114/68 72 16 97.8 Treatment Response Treatment Toleration: Well Treatment Completion Treatment Completed without Adverse Event Status: HBO Attestation I certify that I supervised this HBO  treatment in accordance with Medicare guidelines. A trained emergency response Yes team is readily available per hospital policies and procedures. Continue HBOT as ordered. Yes CARLTON, SWEANEY ([id number]) Electronic Signature(s) Signed: 06/05/2018 10:41:26 PM By: Worthy Keeler PA-C Previous Signature: 06/04/2018 4:11:24 PM Version By: Lorine Bears RCP, RRT, CHT Entered By: Worthy Keeler on 06/05/2018 22:40:35 [name] ([id number]) -------------------------------------------------------------------------------- HBO Safety Checklist Details Patient Name: [name]. Date of Service: 06/04/2018 12:15 PM Medical Record Number: [id number] Patient Account Number: [id number] Date of Birth/Sex: Feb 28, 1941 (77 y.o. F) Treating RN: Montey Hora Primary Care Latia Mataya: Ramonita Lab Other Clinician: Jacqulyn Bath Referring Marlea Gambill: Ramonita Lab Treating Mikele Sifuentes/Extender: Melburn Hake, HOYT Weeks in Treatment: 1 HBO Safety Checklist Items Safety Checklist Consent Form Signed Patient voided / foley secured and emptied When did you last eato n/a Last dose of injectable or oral agent n/a Ostomy pouch emptied and vented if applicable NA All implantable devices assessed, documented and approved NA Intravenous access site secured and place NA Valuables secured Linens and cotton and cotton/polyester blend (less than 51% polyester) Personal oil-based products / skin lotions / body lotions removed Wigs or hairpieces removed NA Smoking or tobacco materials removed NA Books / newspapers / magazines / loose paper removed Cologne, aftershave, perfume and deodorant removed Jewelry removed (may wrap wedding band) Make-up removed Hair care products removed Battery operated devices (external) removed Heating patches and chemical warmers removed Titanium eyewear removed Nail polish cured greater than 10 hours NA Casting material cured greater than 10  hours NA Hearing aids removed Loose dentures or partials removed NA Prosthetics have been removed NA Patient demonstrates correct use of air break device (if applicable) Patient concerns have been addressed Patient grounding bracelet on and cord attached to chamber Specifics for Inpatients (complete in addition to above) Medication sheet sent with patient Intravenous  medications needed or due during therapy sent with patient Drainage tubes (e.g. nasogastric tube or chest tube secured and vented) Endotracheal or Tracheotomy tube secured Cuff deflated of air and inflated with saline Airway suctioned Electronic Signature(s) Signed: 06/04/2018 4:11:24 PM By: Lorine Bears RCP, RRT, CHT Ingram, Ketchum R. ([id number]) Entered By: Lorine Bears on 06/04/2018 12:46:04

## 2018-06-07 NOTE — Progress Notes (Signed)
[name], [name] ([id number]) Visit Report for 06/04/2018 Arrival Information Details Patient Name: [name], [name]. Date of Service: 06/04/2018 12:15 PM Medical Record Number: [id number] Patient Account Number: [id number] Date of Birth/Sex: 1941-04-02 (77 y.o. F) Treating RN: Montey Hora Primary Care Zimri Brennen: Ramonita Lab Other Clinician: Jacqulyn Bath Referring Jerriah Ines: Ramonita Lab Treating Arpi Diebold/Extender: Melburn Hake, HOYT Weeks in Treatment: 1 Visit Information History Since Last Visit Added or deleted any medications: No Patient Arrived: Walker Any new allergies or adverse reactions: No Arrival Time: 12:04 Had a fall or experienced change in No Accompanied By: husband activities of daily living that may affect Transfer Assistance: Manual risk of falls: Patient Identification Verified: Yes Signs or symptoms of abuse/neglect since last visito No Secondary Verification Process Completed: Yes Hospitalized since last visit: No Implantable device outside of the clinic excluding No cellular tissue based products placed in the center since last visit: Has Dressing in Place as Prescribed: Yes Pain Present Now: No Electronic Signature(s) Signed: 06/04/2018 4:11:24 PM By: Lorine Bears RCP, RRT, CHT Entered By: Lorine Bears on 06/04/2018 12:05:10 [name] ([id number]) -------------------------------------------------------------------------------- Encounter Discharge Information Details Patient Name: [name]. Date of Service: 06/04/2018 12:15 PM Medical Record Number: [id number] Patient Account Number: [id number] Date of Birth/Sex: Feb 13, 1941 (77 y.o. F) Treating RN: Montey Hora Primary Care Issiac Jamar: Ramonita Lab Other Clinician: Jacqulyn Bath Referring Lorenzo Arscott: Ramonita Lab Treating Jane Birkel/Extender: Melburn Hake, HOYT Weeks in Treatment: 1 Encounter Discharge Information Items Discharge Condition: Stable Ambulatory  Status: Walker Discharge Destination: Home Transportation: Private Auto Accompanied By: husband Schedule Follow-up Appointment: No Clinical Summary of Care: Notes Patient has an HBO treatment scheduled on 06/07/18 at 12:15 pm. Electronic Signature(s) Signed: 06/04/2018 4:11:24 PM By: Lorine Bears RCP, RRT, CHT Entered By: Lorine Bears on 06/04/2018 16:11:06 [name] ([id number]) -------------------------------------------------------------------------------- Patient/Caregiver Education Details Patient Name: [name] Date of Service: 06/04/2018 12:15 PM Medical Record Number: [id number] Patient Account Number: [id number] Date of Birth/Gender: [birth year] (77 y.o. F) Treating RN: Montey Hora Primary Care Physician: Ramonita Lab Other Clinician: Jacqulyn Bath Referring Physician: Ramonita Lab Treating Physician/Extender: Sharalyn Ink in Treatment: 1 Education Assessment Education Provided To: Patient Education Topics Provided Hyperbaric Oxygenation: Responses: State content correctly Electronic Signature(s) Signed: 06/04/2018 4:11:24 PM By: Lorine Bears RCP, RRT, CHT Entered By: Lorine Bears on 06/04/2018 16:10:22 [name] ([id number]) -------------------------------------------------------------------------------- Vitals Details Patient Name: [name] Date of Service: 06/04/2018 12:15 PM Medical Record Number: [id number] Patient Account Number: [id number] Date of Birth/Sex: [birth year] (77 y.o. F) Treating RN: Montey Hora Primary Care Duglas Heier: Ramonita Lab Other Clinician: Jacqulyn Bath Referring Kella Splinter: Ramonita Lab Treating Makaley Storts/Extender: Melburn Hake, HOYT Weeks in Treatment: 1 Vital Signs Time Taken: 12:10 Temperature (F): 97.7 Height (in): 64 Pulse (bpm): 78 Weight (lbs): 164 Respiratory Rate (breaths/min): 16 Body Mass Index (BMI): 28.1 Blood  Pressure (mmHg): 112/66 Reference Range: 80 - 120 mg / dl Electronic Signature(s) Signed: 06/04/2018 4:11:24 PM By: Lorine Bears RCP, RRT, CHT Entered By: Lorine Bears on 06/04/2018 12:45:00

## 2018-06-08 ENCOUNTER — Encounter: Payer: Medicare Other | Admitting: Physician Assistant

## 2018-06-08 DIAGNOSIS — H9122 Sudden idiopathic hearing loss, left ear: Secondary | ICD-10-CM | POA: Diagnosis not present

## 2018-06-08 NOTE — Progress Notes (Signed)
SHANDI, GODFREY ([id number]) Visit Report for 06/07/2018 Arrival Information Details Patient Name: [name], [name]. Date of Service: 06/07/2018 12:15 PM Medical Record Number: [id number] Patient Account Number: [id number] Date of Birth/Sex: 1941/01/20 (77 y.o. F) Treating RN: Cornell Barman Primary Care Shavonna Corella: Ramonita Lab Other Clinician: Jacqulyn Bath Referring Datra Clary: Ramonita Lab Treating Juleah Paradise/Extender: Melburn Hake, HOYT Weeks in Treatment: 2 Visit Information History Since Last Visit Added or deleted any medications: No Patient Arrived: [name] Any new allergies or adverse reactions: No Arrival Time: 12:05 Had a fall or experienced change in No Accompanied By: husband activities of daily living that may affect Transfer Assistance: Manual risk of falls: Patient Identification Verified: Yes Signs or symptoms of abuse/neglect since last visito No Secondary Verification Process Completed: Yes Hospitalized since last visit: No Implantable device outside of the clinic excluding No cellular tissue based products placed in the center since last visit: Pain Present Now: No Electronic Signature(s) Signed: 06/07/2018 2:50:53 PM By: Lorine Bears RCP, RRT, CHT Entered By: Lorine Bears on 06/07/2018 12:40:29 [name] ([id number]) -------------------------------------------------------------------------------- Encounter Discharge Information Details Patient Name: [name]. Date of Service: 06/07/2018 12:15 PM Medical Record Number: [id number] Patient Account Number: [id number] Date of Birth/Sex: Jun 17, 1941 (77 y.o. F) Treating RN: Cornell Barman Primary Care Nura Cahoon: Ramonita Lab Other Clinician: Jacqulyn Bath Referring Chelsy Parrales: Ramonita Lab Treating Swayzee Wadley/Extender: Worthy Keeler Weeks in Treatment: 2 Encounter Discharge Information Items Discharge Condition: Stable Ambulatory Status: Ambulatory Discharge Destination:  Home Transportation: Private Auto Accompanied By: husband Schedule Follow-up Appointment: No Clinical Summary of Care: Notes Patient has an HBO treatment scheduled on 06/08/18 at 12:15 pm. Electronic Signature(s) Signed: 06/07/2018 2:50:53 PM By: Lorine Bears RCP, RRT, CHT Entered By: Lorine Bears on 06/07/2018 14:50:34 [name] ([id number]) -------------------------------------------------------------------------------- Patient/Caregiver Education Details Patient Name: [name]. Date of Service: 06/07/2018 12:15 PM Medical Record Number: [id number] Patient Account Number: [id number] Date of Birth/Gender: July 06, 1941 (77 y.o. F) Treating RN: Cornell Barman Primary Care Physician: Ramonita Lab Other Clinician: Jacqulyn Bath Referring Physician: Ramonita Lab Treating Physician/Extender: Sharalyn Ink in Treatment: 2 Education Assessment Education Provided To: Patient Education Topics Provided Hyperbaric Oxygenation: Responses: State content correctly Electronic Signature(s) Signed: 06/07/2018 2:50:53 PM By: Lorine Bears RCP, RRT, CHT Entered By: Becky Sax, Amado Nash on 06/07/2018 14:49:52 [name] ([id number]) -------------------------------------------------------------------------------- Vitals Details Patient Name: [name] Date of Service: 06/07/2018 12:15 PM Medical Record Number: [id number] Patient Account Number: [id number] Date of Birth/Sex: 1941/01/30 (77 y.o. F) Treating RN: Cornell Barman Primary Care Korbyn Chopin: Ramonita Lab Other Clinician: Jacqulyn Bath Referring Dany Harten: Ramonita Lab Treating Aviana Shevlin/Extender: Melburn Hake, HOYT Weeks in Treatment: 2 Vital Signs Time Taken: 12:18 Temperature (F): 97.6 Height (in): 64 Pulse (bpm): 78 Weight (lbs): 164 Respiratory Rate (breaths/min): 16 Body Mass Index (BMI): 28.1 Blood Pressure (mmHg): 112/62 Reference Range: 80 - 120  mg / dl Electronic Signature(s) Signed: 06/07/2018 2:50:53 PM By: Lorine Bears RCP, RRT, CHT Entered By: Becky Sax, Amado Nash on 06/07/2018 12:41:08

## 2018-06-09 ENCOUNTER — Encounter: Payer: Medicare Other | Admitting: Internal Medicine

## 2018-06-09 DIAGNOSIS — H9122 Sudden idiopathic hearing loss, left ear: Secondary | ICD-10-CM | POA: Diagnosis not present

## 2018-06-09 NOTE — Progress Notes (Signed)
CYRIAH, CHILDREY ([id number]) Visit Report for 06/07/2018 Problem List Details Patient Name: [name], [name]. Date of Service: 06/07/2018 12:15 PM Medical Record Number: [id number] Patient Account Number: [id number] Date of Birth/Sex: 08-Feb-1941 (77 y.o. 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 Date of Service: 06/07/2018 Medical Record Number: [id number] Patient Account Number: [id number] Date of Birth/Sex: 1941-04-18 (77 y.o. 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 C50.812 Malignant neoplasm of  overlapping sites of left female breast Facility Procedures CPT4 Code: 17408144 Description: (Facility Use Only) HBOT, full body chamber, 67min Modifier: Quantity: 4 Physician Procedures CPT4: Description Modifier Quantity Code [id number] - WC PHYS HYPERBARIC OXYGEN THERAPY 1 ICD-10 Diagnosis Description 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

## 2018-06-09 NOTE — Progress Notes (Signed)
KEIRSTON, SAEPHANH ([id number]) Visit Report for 06/08/2018 Arrival Information Details Patient Name: STORI, ROYSE. Date of Service: 06/08/2018 12:15 PM Medical Record Number: [id number] Patient Account Number: [id number] Date of Birth/Sex: [birth year] (77 y.o. F) Treating RN: Montey Hora Primary Care Havard Radigan: Ramonita Lab Other Clinician: Jacqulyn Bath Referring Catalea Labrecque: Ramonita Lab Treating Alben Jepsen/Extender: Melburn Hake, HOYT Weeks in Treatment: 2 Visit Information History Since Last Visit Added or deleted any medications: No Patient Arrived: Cane Any new allergies or adverse reactions: No Arrival Time: 12:40 Had a fall or experienced change in No Accompanied By: husband activities of daily living that may affect Transfer Assistance: Manual risk of falls: Patient Identification Verified: Yes Signs or symptoms of abuse/neglect since last visito No Secondary Verification Process Completed: Yes Hospitalized since last visit: No Implantable device outside of the clinic excluding No cellular tissue based products placed in the center since last visit: Pain Present Now: No Electronic Signature(s) Signed: 06/08/2018 2:49:09 PM By: Lorine Bears RCP, RRT, CHT Entered By: Becky Sax, Amado Nash on 06/08/2018 12:41:31 [name] ([id number]) -------------------------------------------------------------------------------- Encounter Discharge Information Details Patient Name: [name]. Date of Service: 06/08/2018 12:15 PM Medical Record Number: [id number] Patient Account Number: [id number] Date of Birth/Sex: 05-Jul-1941 (77 y.o. F) Treating RN: Montey Hora Primary Care Bruk Tumolo: Ramonita Lab Other Clinician: Jacqulyn Bath Referring Praneeth Bussey: Ramonita Lab Treating Katianne Barre/Extender: Melburn Hake, HOYT Weeks in Treatment: 2 Encounter Discharge Information Items Discharge Condition: Stable Ambulatory Status: Ambulatory Discharge Destination:  Home Transportation: Private Auto Accompanied By: husband Schedule Follow-up Appointment: No Clinical Summary of Care: Notes Patient has an HBO treatment scheduled on 06/09/18 at 12:15 pm. Electronic Signature(s) Signed: 06/08/2018 2:49:09 PM By: Lorine Bears RCP, RRT, CHT Entered By: Lorine Bears on 06/08/2018 14:48:47 [name] ([id number]) -------------------------------------------------------------------------------- Patient/Caregiver Education Details Patient Name: [name] Date of Service: 06/08/2018 12:15 PM Medical Record Number: [id number] Patient Account Number: [id number] Date of Birth/Gender: [birth year] (77 y.o. F) Treating RN: Montey Hora Primary Care Physician: Ramonita Lab Other Clinician: Jacqulyn Bath Referring Physician: Ramonita Lab Treating Physician/Extender: Sharalyn Ink in Treatment: 2 Education Assessment Education Provided To: Patient Education Topics Provided Hyperbaric Oxygenation: Responses: State content correctly Electronic Signature(s) Signed: 06/08/2018 2:49:09 PM By: Lorine Bears RCP, RRT, CHT Entered By: Becky Sax, Amado Nash on 06/08/2018 14:47:46 [name] ([id number]) -------------------------------------------------------------------------------- Vitals Details Patient Name: [name] Date of Service: 06/08/2018 12:15 PM Medical Record Number: [id number] Patient Account Number: [id number] Date of Birth/Sex: 08-16-1940 (77 y.o. F) Treating RN: Montey Hora Primary Care Masiah Lewing: Ramonita Lab Other Clinician: Jacqulyn Bath Referring Roquel Burgin: Ramonita Lab Treating Paxson Harrower/Extender: Melburn Hake, HOYT Weeks in Treatment: 2 Vital Signs Time Taken: 12:15 Temperature (F): 97.6 Height (in): 64 Pulse (bpm): 84 Weight (lbs): 164 Respiratory Rate (breaths/min): 16 Body Mass Index (BMI): 28.1 Blood Pressure (mmHg): 110/60 Reference Range:  80 - 120 mg / dl Electronic Signature(s) Signed: 06/08/2018 2:49:09 PM By: Lorine Bears RCP, RRT, CHT Entered By: Lorine Bears on 06/08/2018 12:48:19

## 2018-06-09 NOTE — Progress Notes (Signed)
SUMAIYAH, MARKERT ([id number]) Visit Report for 06/07/2018 HBO Details Patient Name: [name], [name]. Date of Service: 06/07/2018 12:15 PM Medical Record Number: [id number] Patient Account Number: [id number] Date of Birth/Sex: [birth year] (77 y.o. 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) HBO Treatment Details Treatment Number: 4 Patient Type: Outpatient Chamber Type: Monoplace Chamber Serial #: [id number] Treatment Protocol: 2.0 ATA with 90 minutes oxygen, with two 5 minute air breaks Treatment Details Compression Rate Down: 1.0 psi / minute De-Compression Rate Up: 1.5 psi / minute Compress Tx Pressure Air breaks and breathing periods Decompress Decompress Begins Reached (leave unused spaces blank) Begins Ends Chamber Pressure (ATA) 1 2 2 2 2 2  --2 1 Clock Time (24 hr) 12:21 12:38 13:08 13:13 13:43 13:48 - - 14:18 14:28 Treatment Length: 127 (minutes) Treatment Segments: 4 Capillary Blood Glucose Pre Capillary Blood Glucose (mg/dl): Post Capillary Blood Glucose (mg/dl): Vital Signs Capillary Blood Glucose Reference Range: 80 - 120 mg / dl HBO Diabetic Blood Glucose Intervention Range: <131 mg/dl or >249 mg/dl Time Vitals Blood Respiratory Capillary Blood Glucose Pulse Action Type: Pulse: Temperature: Taken: Pressure: Rate: Glucose (mg/dl): Meter #: Oximetry (%) Taken: Pre 12:18 112/62 78 16 97.6 Post 14:33 110/72 60 16 98.1 Treatment Response Treatment Toleration: Well Treatment Completion Treatment Completed without Adverse Event Status: [name] Notes Patient is tolerating HBO very well and  there does not appear to be any evidence of complication which is good news. HBO Attestation I certify that I supervised this HBO treatment in accordance with Medicare guidelines. A trained emergency response Yes team is readily available per hospital policies and procedures. [name], Michele R. ([id number]) Continue HBOT as ordered. Yes Electronic Signature(s) Signed: 06/07/2018 6:19:34 PM By: Worthy Keeler PA-C Previous Signature: 06/07/2018 2:50:53 PM Version By: Lorine Bears RCP, RRT, CHT Entered By: Worthy Keeler on 06/07/2018 18:11:46 [name] ([id number]) -------------------------------------------------------------------------------- HBO Safety Checklist Details Patient Name: [name]. Date of Service: 06/07/2018 12:15 PM Medical Record Number: [id number] Patient Account Number: [id number] Date of Birth/Sex: [birth year] (77 y.o. F) Treating RN: Cornell Barman Primary Care Zylpha Poynor: Ramonita Lab Other Clinician: Jacqulyn Bath Referring Norvin Ohlin: Ramonita Lab Treating Desta Bujak/Extender: Melburn Hake, HOYT Weeks in Treatment: 2 HBO Safety Checklist Items Safety Checklist Consent Form Signed Patient voided / foley secured and emptied When did you last eato n/a Last dose of injectable or oral agent n/a Ostomy pouch emptied and vented if applicable NA All implantable devices assessed, documented and approved NA Intravenous access site secured and place NA Valuables secured Linens and cotton and cotton/polyester blend (less than 51% polyester) Personal oil-based products / skin lotions / body lotions removed Wigs or hairpieces removed NA Smoking or tobacco materials removed NA Books / newspapers / magazines / loose paper removed Cologne, aftershave, perfume and deodorant removed Jewelry removed (may wrap wedding band) Make-up removed Hair care products removed Battery operated devices (external) removed Heating patches and chemical warmers  removed Titanium eyewear removed Nail polish cured greater than 10 hours NA Casting material cured greater than 10 hours NA Hearing aids removed Loose dentures or partials removed NA Prosthetics have been removed NA Patient demonstrates correct use of air break device (if applicable) Patient concerns have been addressed  Patient grounding bracelet on and cord attached to chamber Specifics for Inpatients (complete in addition to above) Medication sheet sent with patient Intravenous medications needed or due during therapy sent with patient Drainage tubes (e.g. nasogastric tube or chest tube secured and vented) Endotracheal or Tracheotomy tube secured Cuff deflated of air and inflated with saline Airway suctioned Electronic Signature(s) Signed: 06/07/2018 2:50:53 PM By: Lorine Bears RCP, RRT, CHT [place], Michele R. ([id number]) Entered By: Lorine Bears on 06/07/2018 12:42:16

## 2018-06-10 ENCOUNTER — Encounter: Payer: Medicare Other | Admitting: Physician Assistant

## 2018-06-10 DIAGNOSIS — H9122 Sudden idiopathic hearing loss, left ear: Secondary | ICD-10-CM | POA: Diagnosis not present

## 2018-06-10 NOTE — Progress Notes (Signed)
GERMANI, GAVILANES ([id number]) Visit Report for 06/08/2018 HBO Details Patient Name: [name], [name]. Date of Service: 06/08/2018 12:15 PM Medical Record Number: [id number] Patient Account Number: [id number] Date of Birth/Sex: 1940-08-05 (77 y.o. 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) HBO Treatment Details Treatment Number: 5 Patient Type: Outpatient Chamber Type: Monoplace Chamber Serial #: [id number] Treatment Protocol: 2.0 ATA with 90 minutes oxygen, with two 5 minute air breaks Treatment Details Compression Rate Down: 1.0 psi / minute De-Compression Rate Up: 1.5 psi / minute Compress Tx Pressure Air breaks and breathing periods Decompress Decompress Begins Reached (leave unused spaces blank) Begins Ends Chamber Pressure (ATA) 1 2 2 2 2 2  --2 1 Clock Time (24 hr) 12:20 12:38 13:08 13:13 13:43 13:48 - - 14:18 14:29 Treatment Length: 129 (minutes) Treatment Segments: 4 Capillary Blood Glucose Pre Capillary Blood Glucose (mg/dl): Post Capillary Blood Glucose (mg/dl): Vital Signs Capillary Blood Glucose Reference Range: 80 - 120 mg / dl HBO Diabetic Blood Glucose Intervention Range: <131 mg/dl or >249 mg/dl Time Vitals Blood Respiratory Capillary Blood Glucose Pulse Action Type: Pulse: Temperature: Taken: Pressure: Rate: Glucose (mg/dl): Meter #: Oximetry (%) Taken: Pre 12:15 110/60 84 16 97.6 Post 14:35 112/66 72 72 98.3 Treatment Response Treatment Toleration: Well Treatment Completion Treatment Completed without Adverse Event Status: HBO Attestation I certify that I supervised this HBO  treatment in accordance with Medicare guidelines. A trained emergency response Yes team is readily available per hospital policies and procedures. Continue HBOT as ordered. Yes [name], [name] ([id number]) Electronic Signature(s) Signed: 06/08/2018 5:50:45 PM By: Worthy Keeler PA-C Previous Signature: 06/08/2018 2:49:09 PM Version By: Lorine Bears RCP, RRT, CHT Entered By: Worthy Keeler on 06/08/2018 17:14:39 [name] ([id number]) -------------------------------------------------------------------------------- HBO Safety Checklist Details Patient Name: [name]. Date of Service: 06/08/2018 12:15 PM Medical Record Number: [id number] Patient Account Number: [id number] Date of Birth/Sex: [birth year] (77 y.o. F) Treating RN: Montey Hora Primary Care Pinky Ravan: Ramonita Lab Other Clinician: Jacqulyn Bath Referring Elaijah Munoz: Ramonita Lab Treating Teressa Mcglocklin/Extender: Melburn Hake, HOYT Weeks in Treatment: 2 HBO Safety Checklist Items Safety Checklist Consent Form Signed Patient voided / foley secured and emptied When did you last eato n/a Last dose of injectable or oral agent n/a Ostomy pouch emptied and vented if applicable NA All implantable devices assessed, documented and approved NA Intravenous access site secured and place NA Valuables secured Linens and cotton and cotton/polyester blend (less than 51% polyester) Personal oil-based products / skin lotions / body lotions removed Wigs or hairpieces removed NA Smoking or tobacco materials removed NA Books / newspapers / magazines / loose paper removed Cologne, aftershave, perfume and deodorant removed Jewelry removed (may wrap wedding band) Make-up removed Hair care products removed Battery operated devices (external) removed Heating patches and chemical warmers removed Titanium eyewear removed Nail polish cured greater than 10 hours NA Casting material cured greater than 10  hours NA Hearing aids removed Loose dentures or partials removed NA Prosthetics have been removed NA Patient demonstrates correct use of air break device (if applicable) Patient concerns have been addressed Patient grounding bracelet on and cord attached to chamber Specifics for Inpatients (complete in addition to above) Medication sheet sent with patient Intravenous  medications needed or due during therapy sent with patient Drainage tubes (e.g. nasogastric tube or chest tube secured and vented) Endotracheal or Tracheotomy tube secured Cuff deflated of air and inflated with saline Airway suctioned Electronic Signature(s) Signed: 06/08/2018 2:49:09 PM By: Lorine Bears RCP, RRT, CHT [name], Michele R. ([id number]) Entered By: Lorine Bears on 06/08/2018 12:49:42

## 2018-06-10 NOTE — Progress Notes (Signed)
MCKINSEY, KEAGLE ([id number]) Visit Report for 06/09/2018 Arrival Information Details Patient Name: [name], [name]. Date of Service: 06/09/2018 12:15 PM Medical Record Number: [id number] Patient Account Number: [id number] Date of Birth/Sex: Jan 16, 1941 (77 y.o. F) Treating RN: Cornell Barman Primary Care Consuelo Thayne: Ramonita Lab Other Clinician: Jacqulyn Bath Referring Nikiah Goin: Ramonita Lab Treating Alic Hilburn/Extender: Tito Dine in Treatment: 2 Visit Information History Since Last Visit Added or deleted any medications: No Patient Arrived: Ambulatory Any new allergies or adverse reactions: No Arrival Time: 11:56 Had a fall or experienced change in No Accompanied By: husband activities of daily living that may affect Transfer Assistance: Manual risk of falls: Patient Identification Verified: Yes Signs or symptoms of abuse/neglect since last visito No Secondary Verification Process Completed: Yes Hospitalized since last visit: No Implantable device outside of the clinic excluding No cellular tissue based products placed in the center since last visit: Pain Present Now: No Electronic Signature(s) Signed: 06/09/2018 2:42:47 PM By: Lorine Bears RCP, RRT, CHT Entered By: Lorine Bears on 06/09/2018 12:29:58 [name] ([id number]) -------------------------------------------------------------------------------- Encounter Discharge Information Details Patient Name: [name]. Date of Service: 06/09/2018 12:15 PM Medical Record Number: [id number] Patient Account Number: [id number] Date of Birth/Sex: 1941/06/22 (77 y.o. F) Treating RN: Cornell Barman Primary Care Decarlos Empey: Ramonita Lab Other Clinician: Jacqulyn Bath Referring Jessie Cowher: Ramonita Lab Treating Ariella Voit/Extender: Tito Dine in Treatment: 2 Encounter Discharge Information Items Discharge Condition: Stable Ambulatory Status: Ambulatory Discharge  Destination: Home Transportation: Private Auto Accompanied By: husband Schedule Follow-up Appointment: No Clinical Summary of Care: Notes Patient has an HBO treatment scheduled on 06/10/18 at 12:15 pm. Electronic Signature(s) Signed: 06/09/2018 2:42:47 PM By: Lorine Bears RCP, RRT, CHT Entered By: Lorine Bears on 06/09/2018 14:42:30 [name] ([id number]) -------------------------------------------------------------------------------- Patient/Caregiver Education Details Patient Name: [name]. Date of Service: 06/09/2018 12:15 PM Medical Record Number: [id number] Patient Account Number: [id number] Date of Birth/Gender: 1940/12/12 (77 y.o. F) Treating RN: Cornell Barman Primary Care Physician: Ramonita Lab Other Clinician: Jacqulyn Bath Referring Physician: Ramonita Lab Treating Physician/Extender: Tito Dine in Treatment: 2 Education Assessment Education Provided To: Patient Education Topics Provided Hyperbaric Oxygenation: Responses: State content correctly Electronic Signature(s) Signed: 06/09/2018 2:42:47 PM By: Lorine Bears RCP, RRT, CHT Entered By: Becky Sax, Amado Nash on 06/09/2018 14:40:54 [name] ([id number]) -------------------------------------------------------------------------------- Vitals Details Patient Name: [name] Date of Service: 06/09/2018 12:15 PM Medical Record Number: [id number] Patient Account Number: [id number] Date of Birth/Sex: July 07, 1941 (77 y.o. F) Treating RN: Cornell Barman Primary Care Yashica Sterbenz: Ramonita Lab Other Clinician: Jacqulyn Bath Referring Ardell Makarewicz: Ramonita Lab Treating Branton Einstein/Extender: Tito Dine in Treatment: 2 Vital Signs Time Taken: 12:09 Temperature (F): 98.2 Height (in): 64 Pulse (bpm): 78 Weight (lbs): 164 Respiratory Rate (breaths/min): 16 Body Mass Index (BMI): 28.1 Blood Pressure (mmHg):  110/60 Reference Range: 80 - 120 mg / dl Electronic Signature(s) Signed: 06/09/2018 2:42:47 PM By: Lorine Bears RCP, RRT, CHT Entered By: Becky Sax, Amado Nash on 06/09/2018 12:30:28

## 2018-06-11 ENCOUNTER — Encounter: Payer: Medicare Other | Admitting: Physician Assistant

## 2018-06-11 DIAGNOSIS — H9122 Sudden idiopathic hearing loss, left ear: Secondary | ICD-10-CM | POA: Diagnosis not present

## 2018-06-11 NOTE — Progress Notes (Signed)
MARILU, RYLANDER ([id number]) Visit Report for 06/09/2018 HBO Details Patient Name: [name], [name]. Date of Service: 06/09/2018 12:15 PM Medical Record Number: [id number] Patient Account Number: [id number] Date of Birth/Sex: Jan 06, 1941 (77 y.o. 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) HBO Treatment Details Treatment Number: 6 Patient Type: Outpatient Chamber Type: Monoplace Chamber Serial #: [id number] Treatment Protocol: 2.0 ATA with 90 minutes oxygen, with two 5 minute air breaks Treatment Details Compression Rate Down: 1.0 psi / minute De-Compression Rate Up: 1.5 psi / minute Compress Tx Pressure Air breaks and breathing periods Decompress Decompress Begins Reached (leave unused spaces blank) Begins Ends Chamber Pressure (ATA) 1 2 2 2 2 2  --2 1 Clock Time (24 hr) 12:11 12:28 12:59 13:04 13:34 13:39 - - 14:10 14:20 Treatment Length: 129 (minutes) Treatment Segments: 4 Capillary Blood Glucose Pre Capillary Blood Glucose (mg/dl): Post Capillary Blood Glucose (mg/dl): Vital Signs Capillary Blood Glucose Reference Range: 80 - 120 mg / dl HBO Diabetic Blood Glucose Intervention Range: <131 mg/dl or >249 mg/dl Time Vitals Blood Respiratory Capillary Blood Glucose Pulse Action Type: Pulse: Temperature: Taken: Pressure: Rate: Glucose (mg/dl): Meter #: Oximetry (%) Taken: Pre 12:09 110/60 78 16 98.2 Post 14:35 110/60 72 16 97.4 Treatment Response Treatment Completion Status: Treatment Completed without Adverse Event Minna Dumire Notes No concerns with treatment given HBO Attestation I certify that I  supervised this HBO treatment in accordance with Medicare guidelines. A trained emergency response Yes team is readily available per hospital policies and procedures. Continue HBOT as ordered. Yes JODIANN, OGNIBENE ([id number]) Electronic Signature(s) Signed: 06/09/2018 5:24:02 PM By: Linton Ham MD Previous Signature: 06/09/2018 2:42:47 PM Version By: Lorine Bears RCP, RRT, CHT Entered By: Linton Ham on 06/09/2018 17:01:05 [name] ([id number]) -------------------------------------------------------------------------------- HBO Safety Checklist Details Patient Name: [name]. Date of Service: 06/09/2018 12:15 PM Medical Record Number: [id number] Patient Account Number: [id number] Date of Birth/Sex: 1940-11-09 (77 y.o. F) Treating RN: Cornell Barman Primary Care Anthonella Klausner: Ramonita Lab Other Clinician: Jacqulyn Bath Referring Ruffus Kamaka: Ramonita Lab Treating Marrisa Kimber/Extender: Tito Dine in Treatment: 2 HBO Safety Checklist Items Safety Checklist Consent Form Signed Patient voided / foley secured and emptied When did you last eato n/a Last dose of injectable or oral agent n/a Ostomy pouch emptied and vented if applicable NA All implantable devices assessed, documented and approved NA Intravenous access site secured and place NA Valuables secured Linens and cotton and cotton/polyester blend (less than 51% polyester) Personal oil-based products / skin lotions / body lotions removed Wigs or hairpieces removed NA Smoking or tobacco materials removed NA Books / newspapers / magazines / loose paper removed Cologne, aftershave, perfume and deodorant removed Jewelry removed (may wrap wedding band) Make-up removed Hair care products removed Battery operated devices (external) removed Heating patches and chemical warmers removed Titanium eyewear removed Nail polish cured greater than 10 hours NA Casting material cured greater than  10 hours NA Hearing aids removed Loose dentures or partials removed NA Prosthetics have been removed NA Patient demonstrates correct use of air break device (if applicable) Patient concerns have been addressed Patient grounding bracelet on and cord attached to chamber Specifics for Inpatients (complete in addition to above) Medication sheet sent with  patient Intravenous medications needed or due during therapy sent with patient Drainage tubes (e.g. nasogastric tube or chest tube secured and vented) Endotracheal or Tracheotomy tube secured Cuff deflated of air and inflated with saline Airway suctioned Electronic Signature(s) Signed: 06/09/2018 2:42:47 PM By: Lorine Bears RCP, RRT, CHT [place], [place] R. ([id number]) Entered By: Lorine Bears on 06/09/2018 12:31:29

## 2018-06-12 NOTE — Progress Notes (Signed)
[name] ([id number]) Visit Report for 06/10/2018 HBO Details Patient Name: [name], [name]. Date of Service: 06/10/2018 12:15 PM Medical Record Number: [id number] Patient Account Number: [id number] Date of Birth/Sex: Mar 06, 1941 (77 y.o. 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) HBO Treatment Details Treatment Number: 7 Patient Type: Outpatient Chamber Type: Monoplace Chamber Serial #: [id number] Treatment Protocol: 2.0 ATA with 90 minutes oxygen, with two 5 minute air breaks Treatment Details Compression Rate Down: 1.0 psi / minute De-Compression Rate Up: 1.5 psi / minute Compress Tx Pressure Air breaks and breathing periods Decompress Decompress Begins Reached (leave unused spaces blank) Begins Ends Chamber Pressure (ATA) 1 2 2 2 2 2  --2 1 Clock Time (24 hr) 12:19 12:36 13:06 13:11 13:41 - - - 14:17 14:27 Treatment Length: 128 (minutes) Treatment Segments: 4 Capillary Blood Glucose Pre Capillary Blood Glucose (mg/dl): Post Capillary Blood Glucose (mg/dl): Vital Signs Capillary Blood Glucose Reference Range: 80 - 120 mg / dl HBO Diabetic Blood Glucose Intervention Range: <131 mg/dl or >249 mg/dl Time Vitals Blood Respiratory Capillary Blood Glucose Pulse Action Type: Pulse: Temperature: Taken: Pressure: Rate: Glucose (mg/dl): Meter #: Oximetry (%) Taken: Pre 12:12 110/58 72 16 Post 14:35 110/62 78 16 98 Treatment Response Treatment Toleration: Well Treatment Completion Treatment Completed without Adverse Event Status: HBO Attestation I certify that I supervised this HBO treatment in  accordance with Medicare guidelines. A trained emergency response Yes team is readily available per hospital policies and procedures. Continue HBOT as ordered. Yes AVALENE, SEALY ([id number]) Electronic Signature(s) Signed: 06/10/2018 5:15:19 PM By: Worthy Keeler PA-C Previous Signature: 06/10/2018 2:48:14 PM Version By: Lorine Bears RCP, RRT, CHT Entered By: Worthy Keeler on 06/10/2018 16:58:46 [name] ([id number]) -------------------------------------------------------------------------------- HBO Safety Checklist Details Patient Name: [name]. Date of Service: 06/10/2018 12:15 PM Medical Record Number: [id number] Patient Account Number: [id number] Date of Birth/Sex: Oct 07, 1940 (77 y.o. F) Treating RN: Montey Hora Primary Care Corgan Mormile: Ramonita Lab Other Clinician: Jacqulyn Bath Referring Leeanne Butters: Ramonita Lab Treating Ramisa Duman/Extender: Melburn Hake, HOYT Weeks in Treatment: 2 HBO Safety Checklist Items Safety Checklist Consent Form Signed Patient voided / foley secured and emptied When did you last eato n/a Last dose of injectable or oral agent n/a Ostomy pouch emptied and vented if applicable NA All implantable devices assessed, documented and approved NA Intravenous access site secured and place NA Valuables secured Linens and cotton and cotton/polyester blend (less than 51% polyester) Personal oil-based products / skin lotions / body lotions removed Wigs or hairpieces removed NA Smoking or tobacco materials removed NA Books / newspapers / magazines / loose paper removed Cologne, aftershave, perfume and deodorant removed Jewelry removed (may wrap wedding band) Make-up removed Hair care products removed Battery operated devices (external) removed Heating patches and chemical warmers removed Titanium eyewear removed Nail polish cured greater than 10 hours NA Casting material cured greater than 10 hours NA Hearing aids  removed Loose dentures or partials removed NA Prosthetics have been removed NA Patient demonstrates correct use of air break device (if applicable) Patient concerns have been addressed Patient grounding bracelet on and cord attached to chamber Specifics for Inpatients (complete in addition to above) Medication sheet sent with patient Intravenous medications  needed or due during therapy sent with patient Drainage tubes (e.g. nasogastric tube or chest tube secured and vented) Endotracheal or Tracheotomy tube secured Cuff deflated of air and inflated with saline Airway suctioned Electronic Signature(s) Signed: 06/10/2018 2:48:14 PM By: Lorine Bears RCP, RRT, CHT [place], [place] R. ([id number]) Entered By: Lorine Bears on 06/10/2018 13:31:28

## 2018-06-12 NOTE — Progress Notes (Signed)
[name], [name] ([id number]) Visit Report for 06/11/2018 Arrival Information Details Patient Name: [name], [name]. Date of Service: 06/11/2018 12:15 PM Medical Record Number: [id number] Patient Account Number: [id number] Date of Birth/Sex: [birth year] (77 y.o. F) Treating RN: Montey Hora Primary Care Delylah Stanczyk: Ramonita Lab Other Clinician: Jacqulyn Bath Referring Patryce Depriest: Ramonita Lab Treating Cayde Held/Extender: Melburn Hake, HOYT Weeks in Treatment: 2 Visit Information History Since Last Visit Added or deleted any medications: No Patient Arrived: [name] Any new allergies or adverse reactions: No Arrival Time: 12:36 Had a fall or experienced change in No Accompanied By: husband activities of daily living that may affect Transfer Assistance: Manual risk of falls: Patient Identification Verified: Yes Signs or symptoms of abuse/neglect since last visito No Secondary Verification Process Completed: Yes Hospitalized since last visit: No Implantable device outside of the clinic excluding No cellular tissue based products placed in the center since last visit: Pain Present Now: No Electronic Signature(s) Signed: 06/11/2018 3:06:22 PM By: Lorine Bears RCP, RRT, CHT Entered By: Lorine Bears on 06/11/2018 12:36:59 [name] ([id number]) -------------------------------------------------------------------------------- Encounter Discharge Information Details Patient Name: [name]. Date of Service: 06/11/2018 12:15 PM Medical Record Number: [id number] Patient Account Number: [id number] Date of Birth/Sex: July 16, 1941 (77 y.o. F) Treating RN: Montey Hora Primary Care Manasseh Pittsley: Ramonita Lab Other Clinician: Jacqulyn Bath Referring Issa Kosmicki: Ramonita Lab Treating Zanobia Griebel/Extender: Melburn Hake, HOYT Weeks in Treatment: 2 Encounter Discharge Information Items Discharge Condition: Stable Ambulatory Status: Cane Discharge Destination:  Home Transportation: Private Auto Accompanied By: husband Schedule Follow-up Appointment: No Clinical Summary of Care: Notes Patient has an HBO treatment scheduled on 06/14/18 at 12:15 pm. Electronic Signature(s) Signed: 06/11/2018 3:06:22 PM By: Lorine Bears RCP, RRT, CHT Entered By: Lorine Bears on 06/11/2018 15:05:57 [name] ([id number]) -------------------------------------------------------------------------------- Patient/Caregiver Education Details Patient Name: [name] Date of Service: 06/11/2018 12:15 PM Medical Record Number: [id number] Patient Account Number: [id number] Date of Birth/Gender: 30-Jun-1941 (77 y.o. F) Treating RN: Montey Hora Primary Care Physician: Ramonita Lab Other Clinician: Jacqulyn Bath Referring Physician: Ramonita Lab Treating Physician/Extender: Sharalyn Ink in Treatment: 2 Education Assessment Education Provided To: Patient Education Topics Provided Hyperbaric Oxygenation: Responses: State content correctly Electronic Signature(s) Signed: 06/11/2018 3:06:22 PM By: Lorine Bears RCP, RRT, CHT Entered By: Lorine Bears on 06/11/2018 15:05:12 [name] ([id number]) -------------------------------------------------------------------------------- Vitals Details Patient Name: [name] Date of Service: 06/11/2018 12:15 PM Medical Record Number: [id number] Patient Account Number: [id number] Date of Birth/Sex: 1940/10/27 (77 y.o. F) Treating RN: Montey Hora Primary Care Maimouna Rondeau: Ramonita Lab Other Clinician: Jacqulyn Bath Referring Hardin Hardenbrook: Ramonita Lab Treating Dallie Patton/Extender: Melburn Hake, HOYT Weeks in Treatment: 2 Vital Signs Time Taken: 12:04 Pulse (bpm): 72 Height (in): 64 Respiratory Rate (breaths/min): 16 Weight (lbs): 164 Blood Pressure (mmHg): 122/62 Body Mass Index (BMI): 28.1 Reference Range: 80 - 120 mg /  dl Electronic Signature(s) Signed: 06/11/2018 3:06:22 PM By: Lorine Bears RCP, RRT, CHT Entered By: Lorine Bears on 06/11/2018 12:37:55

## 2018-06-12 NOTE — Progress Notes (Signed)
[name], [name] ([id number]) Visit Report for 06/10/2018 Arrival Information Details Patient Name: [name]. Date of Service: 06/10/2018 12:15 PM Medical Record Number: [id number] Patient Account Number: [id number] Date of Birth/Sex: 22-Nov-1940 (77 y.o. F) Treating RN: Montey Hora Primary Care Darleth Eustache: Ramonita Lab Other Clinician: Jacqulyn Bath Referring Cimone Fahey: Ramonita Lab Treating Willys Salvino/Extender: Melburn Hake, HOYT Weeks in Treatment: 2 Visit Information History Since Last Visit Added or deleted any medications: No Patient Arrived: Walker Any new allergies or adverse reactions: No Arrival Time: 12:02 Had a fall or experienced change in No Accompanied By: husband activities of daily living that may affect Transfer Assistance: Manual risk of falls: Patient Identification Verified: Yes Signs or symptoms of abuse/neglect since last visito No Secondary Verification Process Completed: Yes Hospitalized since last visit: No Implantable device outside of the clinic excluding No cellular tissue based products placed in the center since last visit: Pain Present Now: No Electronic Signature(s) Signed: 06/10/2018 2:48:14 PM By: Lorine Bears RCP, RRT, CHT Entered By: Lorine Bears on 06/10/2018 12:08:25 [name] ([id number]) -------------------------------------------------------------------------------- Encounter Discharge Information Details Patient Name: [name]. Date of Service: 06/10/2018 12:15 PM Medical Record Number: [id number] Patient Account Number: [id number] Date of Birth/Sex: April 28, 1941 (77 y.o. F) Treating RN: Montey Hora Primary Care Tamra Koos: Ramonita Lab Other Clinician: Jacqulyn Bath Referring Tevis Dunavan: Ramonita Lab Treating Loyce Klasen/Extender: Melburn Hake, HOYT Weeks in Treatment: 2 Encounter Discharge Information Items Discharge Condition: Stable Ambulatory Status: Walker Discharge Destination:  Home Transportation: Private Auto Accompanied By: husband Schedule Follow-up Appointment: No Clinical Summary of Care: Notes Patient has an HBO treatment scheduled on 06/11/18 at 12:15 pm. Electronic Signature(s) Signed: 06/10/2018 2:48:14 PM By: Lorine Bears RCP, RRT, CHT Entered By: Lorine Bears on 06/10/2018 14:47:39 [name] ([id number]) -------------------------------------------------------------------------------- Patient/Caregiver Education Details Patient Name: [name] Date of Service: 06/10/2018 12:15 PM Medical Record Number: [id number] Patient Account Number: [id number] Date of Birth/Gender: 10-Jun-1941 (77 y.o. F) Treating RN: Montey Hora Primary Care Physician: Ramonita Lab Other Clinician: Jacqulyn Bath Referring Physician: Ramonita Lab Treating Physician/Extender: Sharalyn Ink in Treatment: 2 Education Assessment Education Provided To: Patient Education Topics Provided Hyperbaric Oxygenation: Responses: State content correctly Electronic Signature(s) Signed: 06/10/2018 2:48:14 PM By: Lorine Bears RCP, RRT, CHT Entered By: Becky Sax, Amado Nash on 06/10/2018 14:47:00 [name] ([id number]) -------------------------------------------------------------------------------- Vitals Details Patient Name: [name] Date of Service: 06/10/2018 12:15 PM Medical Record Number: [id number] Patient Account Number: [id number] Date of Birth/Sex: 08/17/1940 (77 y.o. F) Treating RN: Montey Hora Primary Care Darnell Stimson: Ramonita Lab Other Clinician: Jacqulyn Bath Referring Azizah Lisle: Ramonita Lab Treating Chanah Tidmore/Extender: Melburn Hake, HOYT Weeks in Treatment: 2 Vital Signs Time Taken: 12:12 Pulse (bpm): 72 Height (in): 64 Respiratory Rate (breaths/min): 16 Weight (lbs): 164 Blood Pressure (mmHg): 110/58 Body Mass Index (BMI): 28.1 Reference Range: 80 - 120 mg /  dl Electronic Signature(s) Signed: 06/10/2018 2:48:14 PM By: Lorine Bears RCP, RRT, CHT Entered By: Lorine Bears on 06/10/2018 13:30:29

## 2018-06-12 NOTE — Progress Notes (Signed)
[name], [name] ([id number]) Visit Report for 06/10/2018 Problem List Details Patient Name: [name], [name]. Date of Service: 06/10/2018 12:15 PM Medical Record Number: [id number] Patient Account Number: [id number] Date of Birth/Sex: 09/21/1940 (77 y.o. 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 Date of Service: 06/10/2018 Medical Record Number: [id number] Patient Account Number: [id number] Date of Birth/Sex: [birth year] (77 y.o. 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 C50.812 Malignant  neoplasm of overlapping sites of left female breast Facility Procedures CPT4 Code: 09628366 Description: (Facility Use Only) HBOT, full body chamber, 3min Modifier: Quantity: 4 Physician Procedures CPT4: Description Modifier Quantity Code 2947654 65035 - WC PHYS HYPERBARIC OXYGEN THERAPY 1 ICD-10 Diagnosis Description 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

## 2018-06-14 ENCOUNTER — Encounter: Payer: Medicare Other | Admitting: Physician Assistant

## 2018-06-14 DIAGNOSIS — H9122 Sudden idiopathic hearing loss, left ear: Secondary | ICD-10-CM | POA: Diagnosis not present

## 2018-06-14 NOTE — Progress Notes (Signed)
ACQUANETTA, CABANILLA ([id number]) Visit Report for 06/11/2018 HBO Details Patient Name: [name], [name]. Date of Service: 06/11/2018 12:15 PM Medical Record Number: [id number] Patient Account Number: [id number] Date of Birth/Sex: 02-18-1941 (77 y.o. 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) HBO Treatment Details Treatment Number: 8 Patient Type: Outpatient Chamber Type: Monoplace Chamber Serial #: [id number] Treatment Protocol: 2.0 ATA with 90 minutes oxygen, with two 5 minute air breaks Treatment Details Compression Rate Down: 1.0 psi / minute De-Compression Rate Up: 1.5 psi / minute Compress Tx Pressure Air breaks and breathing periods Decompress Decompress Begins Reached (leave unused spaces blank) Begins Ends Chamber Pressure (ATA) 1 2 2 2 2 2  --2 1 Clock Time (24 hr) 12:19 12:36 13:06 13:11 13:42 13:47 - - 14:17 14:28 Treatment Length: 129 (minutes) Treatment Segments: 4 Capillary Blood Glucose Pre Capillary Blood Glucose (mg/dl): Post Capillary Blood Glucose (mg/dl): Vital Signs Capillary Blood Glucose Reference Range: 80 - 120 mg / dl HBO Diabetic Blood Glucose Intervention Range: <131 mg/dl or >249 mg/dl Time Vitals Blood Respiratory Capillary Blood Glucose Pulse Action Type: Pulse: Temperature: Taken: Pressure: Rate: Glucose (mg/dl): Meter #: Oximetry (%) Taken: Pre 12:04 122/62 72 16 Post 14:35 110/62 72 16 98 Treatment Response Treatment Toleration: Well Treatment Completion Treatment Completed without Adverse Event Status: HBO Attestation I certify that I supervised this HBO  treatment in accordance with Medicare guidelines. A trained emergency response Yes team is readily available per hospital policies and procedures. Continue HBOT as ordered. Yes [name], [name] ([id number]) Electronic Signature(s) Signed: 06/12/2018 9:09:54 AM By: Worthy Keeler PA-C Previous Signature: 06/11/2018 3:06:22 PM Version By: Lorine Bears RCP, RRT, CHT Entered By: Worthy Keeler on 06/11/2018 21:23:36 [name] ([id number]) -------------------------------------------------------------------------------- HBO Safety Checklist Details Patient Name: [name]. Date of Service: 06/11/2018 12:15 PM Medical Record Number: [id number] Patient Account Number: [id number] Date of Birth/Sex: 03/26/1941 (77 y.o. F) Treating RN: Montey Hora Primary Care Zacheriah Stumpe: Ramonita Lab Other Clinician: Jacqulyn Bath Referring Dessire Grimes: Ramonita Lab Treating Rhylen Pulido/Extender: Melburn Hake, HOYT Weeks in Treatment: 2 HBO Safety Checklist Items Safety Checklist Consent Form Signed Patient voided / foley secured and emptied When did you last eato n/a Last dose of injectable or oral agent n/a Ostomy pouch emptied and vented if applicable NA All implantable devices assessed, documented and approved NA Intravenous access site secured and place NA Valuables secured Linens and cotton and cotton/polyester blend (less than 51% polyester) Personal oil-based products / skin lotions / body lotions removed Wigs or hairpieces removed NA Smoking or tobacco materials removed NA Books / newspapers / magazines / loose paper removed Cologne, aftershave, perfume and deodorant removed Jewelry removed (may wrap wedding band) Make-up removed Hair care products removed Battery operated devices (external) removed Heating patches and chemical warmers removed Titanium eyewear removed Nail polish cured greater than 10 hours NA Casting material cured greater than 10  hours NA Hearing aids removed Loose dentures or partials removed NA Prosthetics have been removed NA Patient demonstrates correct use of air break device (if applicable) Patient concerns have been addressed Patient grounding bracelet on and cord attached to chamber Specifics for Inpatients (complete in addition to above) Medication sheet sent with patient Intravenous medications  needed or due during therapy sent with patient Drainage tubes (e.g. nasogastric tube or chest tube secured and vented) Endotracheal or Tracheotomy tube secured Cuff deflated of air and inflated with saline Airway suctioned Electronic Signature(s) Signed: 06/11/2018 3:06:22 PM By: Lorine Bears RCP, RRT, CHT [name], Michele R. ([id number]) Entered By: Lorine Bears on 06/11/2018 12:38:53

## 2018-06-14 NOTE — Progress Notes (Signed)
ASANTE, RITACCO ([id number]) Visit Report for 06/11/2018 Problem List Details Patient Name: [name], [name]. Date of Service: 06/11/2018 12:15 PM Medical Record Number: [id number] Patient Account Number: [id number] Date of Birth/Sex: 02-Nov-1940 (77 y.o. 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 Date of Service: 06/11/2018 Medical Record Number: [id number] Patient Account Number: [id number] Date of Birth/Sex: 12-13-1940 (77 y.o. 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 C50.812 Malignant  neoplasm of overlapping sites of left female breast Facility Procedures CPT4 Code: [id number] Description: (Facility Use Only) HBOT, full body chamber, 68min Modifier: Quantity: 4 Physician Procedures CPT4: Description Modifier Quantity Code [id number] - WC PHYS HYPERBARIC OXYGEN THERAPY 1 ICD-10 Diagnosis Description 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

## 2018-06-15 ENCOUNTER — Encounter: Payer: Medicare Other | Admitting: Physician Assistant

## 2018-06-15 DIAGNOSIS — H9122 Sudden idiopathic hearing loss, left ear: Secondary | ICD-10-CM | POA: Diagnosis not present

## 2018-06-15 NOTE — Progress Notes (Signed)
[name] ([id number]) Visit Report for 06/14/2018 Arrival Information Details Patient Name: [name], [name]. Date of Service: 06/14/2018 12:15 PM Medical Record Number: [id number] Patient Account Number: [id number] Date of Birth/Sex: [birth year] (77 y.o. F) Treating RN: Cornell Barman Primary Care Andrea Colglazier: Ramonita Lab Other Clinician: Jacqulyn Bath Referring Beautifull Cisar: Ramonita Lab Treating Zariel Capano/Extender: Melburn Hake, HOYT Weeks in Treatment: 3 Visit Information History Since Last Visit Added or deleted any medications: No Patient Arrived: Ambulatory Any new allergies or adverse reactions: No Arrival Time: 12:21 Had a fall or experienced change in No Accompanied By: husband activities of daily living that may affect Transfer Assistance: Manual risk of falls: Patient Identification Verified: Yes Signs or symptoms of abuse/neglect since last visito No Secondary Verification Process Completed: Yes Hospitalized since last visit: No Implantable device outside of the clinic excluding No cellular tissue based products placed in the center since last visit: Pain Present Now: No Electronic Signature(s) Signed: 06/14/2018 4:23:53 PM By: Lorine Bears RCP, RRT, CHT Entered By: Lorine Bears on 06/14/2018 12:23:21 [name] ([id number]) -------------------------------------------------------------------------------- Encounter Discharge Information Details Patient Name: [name]. Date of Service: 06/14/2018 12:15 PM Medical Record Number: [id number] Patient Account Number: [id number] Date of Birth/Sex: 1941-05-11 (77 y.o. F) Treating RN: Cornell Barman Primary Care Alvina Strother: Ramonita Lab Other Clinician: Jacqulyn Bath Referring Sander Speckman: Ramonita Lab Treating Rafaelita Foister/Extender: Melburn Hake, HOYT Weeks in Treatment: 3 Encounter Discharge Information Items Discharge Condition: Stable Ambulatory Status: Cane Discharge Destination:  Home Transportation: Private Auto Accompanied By: husband Schedule Follow-up Appointment: No Clinical Summary of Care: Notes Patient has an HBO treatment scheduled on 06/15/18 at 08:15 am. Electronic Signature(s) Signed: 06/14/2018 4:23:53 PM By: Lorine Bears RCP, RRT, CHT Entered By: Lorine Bears on 06/14/2018 16:23:31 [name] ([id number]) -------------------------------------------------------------------------------- Patient/Caregiver Education Details Patient Name: [name] Date of Service: 06/14/2018 12:15 PM Medical Record Number: [id number] Patient Account Number: [id number] Date of Birth/Gender: [birth year] (77 y.o. F) Treating RN: Cornell Barman Primary Care Physician: Ramonita Lab Other Clinician: Jacqulyn Bath Referring Physician: Ramonita Lab Treating Physician/Extender: Sharalyn Ink in Treatment: 3 Education Assessment Education Provided To: Patient Education Topics Provided Hyperbaric Oxygenation: Responses: State content correctly Electronic Signature(s) Signed: 06/14/2018 4:23:53 PM By: Lorine Bears RCP, RRT, CHT Entered By: Lorine Bears on 06/14/2018 16:22:46 [name] ([id number]) -------------------------------------------------------------------------------- Vitals Details Patient Name: [name] Date of Service: 06/14/2018 12:15 PM Medical Record Number: [id number] Patient Account Number: [id number] Date of Birth/Sex: 1940/09/22 (77 y.o. F) Treating RN: Cornell Barman Primary Care Jazae Gandolfi: Ramonita Lab Other Clinician: Jacqulyn Bath Referring Libni Fusaro: Ramonita Lab Treating Lavere Stork/Extender: Melburn Hake, HOYT Weeks in Treatment: 3 Vital Signs Time Taken: 12:10 Pulse (bpm): 84 Height (in): 64 Respiratory Rate (breaths/min): 16 Weight (lbs): 164 Blood Pressure (mmHg): 110/60 Body Mass Index (BMI): 28.1 Reference Range: 80 - 120 mg / dl Electronic  Signature(s) Signed: 06/14/2018 4:23:53 PM By: Lorine Bears RCP, RRT, CHT Entered By: Becky Sax, Amado Nash on 06/14/2018 12:24:27

## 2018-06-16 ENCOUNTER — Inpatient Hospital Stay: Payer: Medicare Other

## 2018-06-16 ENCOUNTER — Inpatient Hospital Stay (HOSPITAL_BASED_OUTPATIENT_CLINIC_OR_DEPARTMENT_OTHER): Payer: Medicare Other | Admitting: Internal Medicine

## 2018-06-16 ENCOUNTER — Inpatient Hospital Stay: Payer: Medicare Other | Attending: Internal Medicine

## 2018-06-16 VITALS — BP 118/72 | HR 84 | Temp 97.7°F | Resp 16 | Wt 163.4 lb

## 2018-06-16 DIAGNOSIS — C773 Secondary and unspecified malignant neoplasm of axilla and upper limb lymph nodes: Principal | ICD-10-CM

## 2018-06-16 DIAGNOSIS — Z5112 Encounter for antineoplastic immunotherapy: Secondary | ICD-10-CM | POA: Diagnosis not present

## 2018-06-16 DIAGNOSIS — C50812 Malignant neoplasm of overlapping sites of left female breast: Secondary | ICD-10-CM | POA: Insufficient documentation

## 2018-06-16 DIAGNOSIS — Z171 Estrogen receptor negative status [ER-]: Secondary | ICD-10-CM | POA: Insufficient documentation

## 2018-06-16 DIAGNOSIS — C50912 Malignant neoplasm of unspecified site of left female breast: Secondary | ICD-10-CM

## 2018-06-16 LAB — CBC WITH DIFFERENTIAL/PLATELET
Abs Immature Granulocytes: 0.04 10*3/uL (ref 0.00–0.07)
BASOS PCT: 1 %
Basophils Absolute: 0 10*3/uL (ref 0.0–0.1)
EOS ABS: 0.2 10*3/uL (ref 0.0–0.5)
EOS PCT: 4 %
HCT: 39.2 % (ref 36.0–46.0)
Hemoglobin: 12.5 g/dL (ref 12.0–15.0)
IMMATURE GRANULOCYTES: 1 %
Lymphocytes Relative: 19 %
Lymphs Abs: 1 10*3/uL (ref 0.7–4.0)
MCH: 29.8 pg (ref 26.0–34.0)
MCHC: 31.9 g/dL (ref 30.0–36.0)
MCV: 93.3 fL (ref 80.0–100.0)
MONOS PCT: 8 %
Monocytes Absolute: 0.4 10*3/uL (ref 0.1–1.0)
NEUTROS PCT: 67 %
Neutro Abs: 3.4 10*3/uL (ref 1.7–7.7)
PLATELETS: 309 10*3/uL (ref 150–400)
RBC: 4.2 MIL/uL (ref 3.87–5.11)
RDW: 13.4 % (ref 11.5–15.5)
WBC: 5 10*3/uL (ref 4.0–10.5)
nRBC: 0 % (ref 0.0–0.2)

## 2018-06-16 LAB — COMPREHENSIVE METABOLIC PANEL
ALT: 21 U/L (ref 0–44)
AST: 28 U/L (ref 15–41)
Albumin: 3.9 g/dL (ref 3.5–5.0)
Alkaline Phosphatase: 55 U/L (ref 38–126)
Anion gap: 8 (ref 5–15)
BILIRUBIN TOTAL: 0.5 mg/dL (ref 0.3–1.2)
BUN: 19 mg/dL (ref 8–23)
CHLORIDE: 104 mmol/L (ref 98–111)
CO2: 25 mmol/L (ref 22–32)
CREATININE: 0.99 mg/dL (ref 0.44–1.00)
Calcium: 8.8 mg/dL — ABNORMAL LOW (ref 8.9–10.3)
GFR calc non Af Amer: 54 mL/min — ABNORMAL LOW (ref >60)
Glucose, Bld: 143 mg/dL — ABNORMAL HIGH (ref 70–99)
Potassium: 4 mmol/L (ref 3.5–5.1)
Sodium: 137 mmol/L (ref 135–145)
TOTAL PROTEIN: 6.5 g/dL (ref 6.5–8.1)

## 2018-06-16 MED ORDER — SODIUM CHLORIDE 0.9 % IV SOLN
420.0000 mg | Freq: Once | INTRAVENOUS | Status: AC
  Administered 2018-06-16: 420 mg via INTRAVENOUS
  Filled 2018-06-16: qty 14

## 2018-06-16 MED ORDER — SODIUM CHLORIDE 0.9% FLUSH
10.0000 mL | INTRAVENOUS | Status: DC | PRN
  Administered 2018-06-16: 10 mL via INTRAVENOUS
  Filled 2018-06-16: qty 10

## 2018-06-16 MED ORDER — TRASTUZUMAB CHEMO 150 MG IV SOLR
450.0000 mg | Freq: Once | INTRAVENOUS | Status: AC
  Administered 2018-06-16: 450 mg via INTRAVENOUS
  Filled 2018-06-16: qty 21.43

## 2018-06-16 MED ORDER — HEPARIN SOD (PORK) LOCK FLUSH 100 UNIT/ML IV SOLN
500.0000 [IU] | Freq: Once | INTRAVENOUS | Status: AC
  Administered 2018-06-16: 500 [IU] via INTRAVENOUS
  Filled 2018-06-16: qty 5

## 2018-06-16 MED ORDER — SODIUM CHLORIDE 0.9 % IV SOLN
Freq: Once | INTRAVENOUS | Status: AC
  Administered 2018-06-16: 10:00:00 via INTRAVENOUS
  Filled 2018-06-16: qty 250

## 2018-06-16 MED ORDER — ACETAMINOPHEN 325 MG PO TABS
650.0000 mg | ORAL_TABLET | Freq: Once | ORAL | Status: AC
  Administered 2018-06-16: 650 mg via ORAL
  Filled 2018-06-16: qty 2

## 2018-06-16 MED ORDER — DIPHENHYDRAMINE HCL 50 MG/ML IJ SOLN
12.5000 mg | Freq: Once | INTRAMUSCULAR | Status: AC
  Administered 2018-06-16: 12.5 mg via INTRAVENOUS
  Filled 2018-06-16: qty 1

## 2018-06-16 NOTE — Assessment & Plan Note (Addendum)
#  Metastatic breast cancer ER [name] HER-2/neu positive-SEP 16th 2019 CT scan NED; STABLE on Herceptin/perjeta maintenance.  # Continue Herceptin/perjeta; Labs today reviewed;  acceptable for treatment today.   #Chronic dementia/debility- stable; s/p PT.   # left ear hearing loss-? Neuro-vascular HBO- evaluation s/p HBO; awaiting Dr.Vaught evaluation.   # chronic hip/back  pain- stable;  continue tramadol prn;   DISPOSITION:  # Treatment today; # follow up in 3 weeks-MD labs/ cbc/cmp-Herceptin-Perjeta- Dr.B

## 2018-06-16 NOTE — Progress Notes (Signed)
[facility] OFFICE PROGRESS NOTE  Patient Care Team: Klein, Bert J III, MD as PCP - General (Internal Medicine) Byrnett, Jeffrey W, MD (General Surgery) Requested, [name]   Oncology History   # June 2016- LEFT BREAST CA;  [name] ; [name] ; [name]; Her 2 Neu POS; TCH from July OF 2016; [name]; adjuvant Herceptin [ Finished July 2017]; AUG 2017- Neratinib x5 days; DISCON sec to diarrhea  # MID OCT 2018- Right breast mass-Bx- ER/PR-NEG; Her 2 NEU POSITIVE 1~2.5cm;  [?NEW primary]  # MID-OCT 2018-METASTATIC RECURRENT-oh sternal mass; Left Ax LN [Bx]/periportal LN  # OCT 25th 2018- TAXOL-HERCEPTIN-PERJETA; Jan 2019- CT PR; continue HP only.    -------------------------------------------------------------------------------   # MUGA scan- July 28th 2017- 67%.  # chronic gait/balance issues  # June 2017- left breast Bx- fat necrosis [[name]  # july 2017-  BRCA 1& 2- NEG.   MOLECULAR TESTING- F ONE- TPS- 0%; pending; ERB2 amplification; PI3K/RET amplification Others**  --------------------------------------------------    DIAGNOSIS: [ OCT 2018]- REC/MET- BREAST CA ER/PR-NEG; her 2 POS  STAGE: 4       ;GOALS: Palliative  CURRENT/MOST RECENT THERAPY- Herceptin-Perjeta      Carcinoma of overlapping sites of left breast in female, estrogen receptor negative (HCC)      INTERVAL HISTORY:  [name] 77 y.o.  female pleasant patient above history of metastatic breast cancer on Herceptin plus Perjeta is here for follow-up.  In the interim patient had finished hyperbaric oxygen for the left ear hearing loss.  No significant improvement yet.  Chronic hip pain.  Complains of chronic left breast upper outer quadrant pain.  Review of Systems  Constitutional: Negative for chills, diaphoresis, fever, malaise/fatigue and weight loss.   HENT: Positive for hearing loss (Left-sided.). Negative for nosebleeds and sore throat.   Eyes: Negative for double vision.  Respiratory: Negative for cough, hemoptysis, sputum production, shortness of breath and wheezing.   Cardiovascular: Negative for chest pain, palpitations, orthopnea and leg swelling.  Gastrointestinal: Negative for abdominal pain, blood in stool, constipation, diarrhea, heartburn, melena, nausea and vomiting.  Genitourinary: Negative for dysuria, frequency and urgency.  Musculoskeletal: Positive for back pain and joint pain.  Skin: Negative.  Negative for itching and rash.  Neurological: Negative for dizziness, tingling, focal weakness, weakness and headaches.  Endo/Heme/Allergies: Does not bruise/bleed easily.  Psychiatric/Behavioral: Positive for memory loss. Negative for depression. The patient is not nervous/anxious and does not have insomnia.       PAST MEDICAL HISTORY :  Past Medical History:  Diagnosis Date  . Arthritis   . 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 Breast cancer (HCC) 2016   left breast; surgery 01/11/15 with Dr. Byrnett; path with invasive mammary and DCIS  . Breast cancer of upper-inner quadrant of left female breast (HCC) 12/15/14   Completed radiation end of December and finished chemotherapy 2 weeks ago, Left breast invasive mammary carcinoma, T1cN1mic (1.5 cm); Grade 3, [name], [name], [name], .  . Cataract    bilat   . DDD (degenerative disc disease), lumbar    Lumbar, previously evaluated by Dr. Howard Miller  . Dementia (HCC)   . Fibrocystic breast disease    prior biopsy  . Glaucoma   . 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   Hearing loss   . History of cancer chemotherapy   . History of radiation therapy   . Hyperlipidemia, unspecified   . Imbalance   . Memory impairment    seen by Dr Shah  possible post crainiotomy from radiation  . 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  . Numbness and tingling    right hand   . Osteoarthritis   . Osteoporosis, post-menopausal   . Personal history of chemotherapy   . Personal history of radiation therapy   . Shoulder pain, right   . Wears glasses     PAST SURGICAL HISTORY :   Past Surgical History:  Procedure Laterality Date  . APPENDECTOMY  1950  . BRAIN SURGERY  1995   left frontal/temporal  . BREAST BIOPSY Left 1997  . BREAST BIOPSY Left 12/15/14   confirmed DCIS  . BREAST BIOPSY Left 01/08/2015   Procedure: BREAST BIOPSY WITH NEEDLE LOCALIZATION;  Surgeon: Jeffrey W Byrnett, MD;  Location: [facility] ORS;  Service: General;  Laterality: Left;  . BREAST LUMPECTOMY Left 01/08/2015   Procedure: LUMPECTOMY;  Surgeon: Jeffrey W Byrnett, MD;  Location: [facility] OR[facility];  Service: General;  Laterality: Left;  . COLONOSCOPY  2010   Dr. Elliot  . ORIF ANKLE FRACTURE Right 08/05/2017   Procedure: OPEN REDUCTION INTERNAL FIXATION (ORIF) ANKLE FRACTURE;  Surgeon: Miller, Howard, MD;  Location: [facility] ORS;  Service: Orthopedics;  Laterality: Right;  . PORTACATH PLACEMENT Right 01/16/2015   Procedure: INSERTION PORT-A-CATH;  Surgeon: Jeffrey W Byrnett, MD;  Location: [facility] OR[facility];  Service: General;  Laterality: Right;  . SENTINEL NODE BIOPSY Left 01/16/2015   Procedure: SENTINEL NODE BIOPSY;  Surgeon: Jeffrey W Byrnett, MD;  Location: [facility] OR[facility];  Service: General;  Laterality: Left;  . TOTAL HIP ARTHROPLASTY Right 09/04/2015   Procedure: RIGHT TOTAL HIP ARTHROPLASTY ANTERIOR APPROACH;  Surgeon: Matthew Olin, MD;  Location: [facility];  Service: Orthopedics;  Laterality: Right;    FAMILY HISTORY :   Family History  Problem Relation Age of Onset  . Breast cancer Sister 48  . Addison's disease Mother   . Hyperthyroidism Mother   . Osteoarthritis Mother   . Stroke Father   . Heart disease Father   . High blood pressure Father     SOCIAL HISTORY:   Social History   Tobacco  Use  . Smoking status: Former Smoker    Packs/day: 0.25    Years: 5.00    Pack years: 1.25    Types: Cigarettes    Last attempt to quit: 07/28/1972    Years since quitting: 45.9  . Smokeless tobacco: Never Used  Substance Use Topics  . Alcohol use: Yes    Alcohol/week: 1.0 - 2.0 standard drinks    Types: 1 - 2 Glasses of wine per week    Comment: 1 Glass Wine / Night  . Drug use: No    ALLERGIES:  is allergic to donepezil.  MEDICATIONS:  Current Outpatient Medications  Medication Sig Dispense Refill  . acetaminophen (TYLENOL) 325 MG tablet Take 650 mg by mouth every 4 (four) hours as needed. Pain / increased temp.    . aspirin 325 MG EC tablet Take 325 mg by mouth 2 (two) times daily.     . bisacodyl (DULCOLAX) 10 MG suppository Place 1 suppository (10 mg total) rectally daily as needed for moderate constipation. (Patient not taking: Reported on 05/26/2018) 12 suppository 0  . Calcium Carbonate-Vit D-Min (  CALTRATE 600+D PLUS MINERALS) 600-800 MG-UNIT TABS Take 1 tablet by mouth every morning.     . Calcium Carbonate-Vitamin D (CALTRATE 600+D PO) Take 1 tablet by mouth daily.    . celecoxib (CELEBREX) 200 MG capsule Take 200 mg by mouth 2 (two) times daily.    . Cholecalciferol (VITAMIN D-3) 5000 units TABS Take 1 tablet by mouth daily.    . levETIRAcetam (KEPPRA) 250 MG tablet 1 tablet daily at bedtime  5  . loratadine (CLARITIN) 10 MG tablet Take 10 mg by mouth daily as needed for allergies.     . prochlorperazine (COMPAZINE) 10 MG tablet Take 1 tablet (10 mg total) every 6 (six) hours as needed by mouth for nausea or vomiting. 40 tablet 1  . traMADol (ULTRAM) 50 MG tablet Take 1 tablet (50 mg total) by mouth 2 (two) times daily as needed for moderate pain. 60 tablet 0  . vitamin C (ASCORBIC ACID) 250 MG tablet Take 250 mg by mouth daily.    . vitamin E 400 UNIT capsule Take 400 Units by mouth daily.      No current facility-administered medications for this visit.     Facility-Administered Medications Ordered in Other Visits  Medication Dose Route Frequency Provider Last Rate Last Dose  . heparin lock flush 100 unit/mL  500 Units Intravenous Once Brahmanday, Govinda R, MD      . sodium chloride flush (NS) 0.9 % injection 10 mL  10 mL Intravenous PRN Brahmanday, Govinda R, MD   10 mL at 06/16/18 0820    PHYSICAL EXAMINATION: ECOG PERFORMANCE STATUS: 1 - Symptomatic but completely ambulatory  BP 118/72 (BP Location: Left Arm, Patient Position: Sitting)   Pulse 84   Temp 97.7 F (36.5 C)   Resp 16   Wt 163 lb 6.4 oz (74.1 kg)   BMI 28.95 kg/m   Filed Weights   06/16/18 0845  Weight: 163 lb 6.4 oz (74.1 kg)    Physical Exam  Constitutional: She is oriented to person, place, and time and well-developed, well-nourished, and in no distress.  In wheel chair/ sec to gait instability; with husband.   HENT:  Head: Normocephalic and atraumatic.  Mouth/Throat: Oropharynx is clear and moist. No oropharyngeal exudate.  Eyes: Pupils are equal, round, and reactive to light.  Neck: Normal range of motion. Neck supple.  Cardiovascular: Normal rate and regular rhythm.  Pulmonary/Chest: No respiratory distress. She has no wheezes.  Abdominal: Soft. Bowel sounds are normal. She exhibits no distension and no mass. There is no tenderness. There is no rebound and no guarding.  Musculoskeletal: Normal range of motion. She exhibits no edema or tenderness.  Neurological: She is alert and oriented to person, place, and time.  Skin: Skin is warm.  Psychiatric: Affect normal.       LABORATORY DATA:  I have reviewed the data as listed    Component Value Date/Time   NA 137 06/16/2018 0820   K 4.0 06/16/2018 0820   CL 104 06/16/2018 0820   CO2 25 06/16/2018 0820   GLUCOSE 143 (H) 06/16/2018 0820   BUN 19 06/16/2018 0820   CREATININE 0.99 06/16/2018 0820   CALCIUM 8.8 (L) 06/16/2018 0820   PROT 6.5 06/16/2018 0820   ALBUMIN 3.9 06/16/2018 0820   AST 28  06/16/2018 0820   ALT 21 06/16/2018 0820   ALKPHOS 55 06/16/2018 0820   BILITOT 0.5 06/16/2018 0820   GFRNONAA 54 (L) 06/16/2018 0820   GFRAA >60 06/16/2018 0820      No results found for: SPEP, UPEP  Lab Results  Component Value Date   WBC 5.0 06/16/2018   NEUTROABS 3.4 06/16/2018   HGB 12.5 06/16/2018   HCT 39.2 06/16/2018   MCV 93.3 06/16/2018   PLT 309 06/16/2018      Chemistry      Component Value Date/Time   NA 137 06/16/2018 0820   K 4.0 06/16/2018 0820   CL 104 06/16/2018 0820   CO2 25 06/16/2018 0820   BUN 19 06/16/2018 0820   CREATININE 0.99 06/16/2018 0820      Component Value Date/Time   CALCIUM 8.8 (L) 06/16/2018 0820   ALKPHOS 55 06/16/2018 0820   AST 28 06/16/2018 0820   ALT 21 06/16/2018 0820   BILITOT 0.5 06/16/2018 0820       RADIOGRAPHIC STUDIES: I have personally reviewed the radiological images as listed and agreed with the findings in the report. No results found.   ASSESSMENT & PLAN:  Carcinoma of overlapping sites of left breast in female, estrogen receptor negative ([place]) #Metastatic breast cancer ER [name] HER-2/neu positive-SEP 16th 2019 CT scan NED; STABLE on Herceptin/perjeta maintenance.  # Continue Herceptin/perjeta; Labs today reviewed;  acceptable for treatment today.   #Chronic dementia/debility- stable; s/p PT.   # left ear hearing loss-? Neuro-vascular HBO- evaluation s/p HBO; awaiting Dr.Vaught evaluation.   # chronic hip/back  pain- stable;  continue tramadol prn;   DISPOSITION:  # Treatment today; # follow up in 3 weeks-MD labs/ cbc/cmp-Herceptin-Perjeta- Dr.B   No orders of the defined types were placed in this encounter.  All questions were answered. The patient knows to call the clinic with any problems, questions or concerns.      Cammie Sickle, MD 06/16/2018 11:18 AM

## 2018-06-16 NOTE — Progress Notes (Addendum)
CHRISTENE, POUNDS ([id number]) Visit Report for 06/14/2018 Problem List Details Patient Name: ELINORE, SHULTS. Date of Service: 06/14/2018 12:15 PM Medical Record Number: [id number] Patient Account Number: [id number] Date of Birth/Sex: [birth year] (77 y.o. 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 Date of Service: 06/14/2018 Medical Record Number: [id number] Patient Account Number: [id number] Date of Birth/Sex: 1940-09-12 (77 y.o. 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 C50.812 Malignant neoplasm of overlapping sites of left female breast Facility Procedures CPT4 Code: 07371062 Description: (Facility Use Only) HBOT, full body chamber, 58min Modifier: Quantity: 4 Physician Procedures CPT4: Description Modifier Quantity Code [id number] [id number] - WC PHYS HYPERBARIC OXYGEN THERAPY 1 ICD-10 Diagnosis Description 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

## 2018-06-16 NOTE — Progress Notes (Addendum)
FRAYA, UEDA ([id number]) Visit Report for 06/14/2018 HBO Details Patient Name: [name], [name]. Date of Service: 06/14/2018 12:15 PM Medical Record Number: [id number] Patient Account Number: [id number] Date of Birth/Sex: January 21, 1941 (77 y.o. F) Treating RN: Cornell Barman Primary Care Riot Waterworth: Ramonita Lab Other Clinician: Jacqulyn Bath Referring Delynn Pursley: Ramonita Lab Treating Frances Ambrosino/Extender: Melburn Hake, HOYT Weeks in Treatment: 3 HBO Treatment Course Details Treatment Course Number: 1 Ordering Iyari Hagner: STONE III, HOYT Total Treatments Ordered: 10 HBO Treatment 06/02/2018 Start Date: HBO Indication: Idiopathic Sudden Sensorineural Healing Loss (ISSHL) HBO Treatment 06/15/2018 End Date: HBO Discharge Treatment Series Complete; No Outcome: Change / No Improvement HBO Treatment Details Treatment Number: 9 Patient Type: Outpatient Chamber Type: Monoplace Chamber Serial #: [id number] Treatment Protocol: 2.0 ATA with 90 minutes oxygen, with two 5 minute air breaks Treatment Details Compression Rate Down: 1.0 psi / minute De-Compression Rate Up: 1.5 psi / minute Compress Tx Pressure Air breaks and breathing periods Decompress Decompress Begins Reached (leave unused spaces blank) Begins Ends Chamber Pressure (ATA) 1 2 2 2 2 2  --2 1 Clock Time (24 hr) 12:13 12:30 13:00 13:05 13:35 13:40 - - 14:10 14:22 Treatment Length: 129 (minutes) Treatment Segments: 4 Capillary Blood Glucose Pre Capillary Blood Glucose (mg/dl): Post Capillary Blood Glucose (mg/dl): Vital Signs Capillary Blood Glucose Reference Range: 80 - 120 mg / dl HBO Diabetic Blood Glucose Intervention Range: <131 mg/dl or >249 mg/dl Time Vitals Blood Respiratory Capillary Blood Glucose Pulse Action Type: Pulse: Temperature: Taken: Pressure: Rate: Glucose (mg/dl): Meter #: Oximetry (%) Taken: Pre 12:10 110/60 84 16 Post 14:25 110/62 72 16 98 Treatment Response Treatment Toleration: Well Treatment  Completion Treatment Completed without Adverse Event Status: HBO Attestation Yes Bumgarner, Nyra R. ([id number]) I certify that I supervised this HBO treatment in accordance with Medicare guidelines. A trained emergency response team is readily available per hospital policies and procedures. Continue HBOT as ordered. Yes Electronic Signature(s) Signed: 06/15/2018 5:24:21 PM By: Worthy Keeler PA-C Previous Signature: 06/14/2018 11:37:57 PM Version By: Worthy Keeler PA-C Previous Signature: 06/14/2018 4:23:53 PM Version By: Lorine Bears RCP, RRT, CHT Entered By: Worthy Keeler on 06/15/2018 13:55:13 [name] ([id number]) -------------------------------------------------------------------------------- HBO Safety Checklist Details Patient Name: [name]. Date of Service: 06/14/2018 12:15 PM Medical Record Number: [id number] Patient Account Number: [id number] Date of Birth/Sex: Dec 18, 1940 (77 y.o. F) Treating RN: Cornell Barman Primary Care Taishawn Smaldone: Ramonita Lab Other Clinician: Jacqulyn Bath Referring Zan Triska: Ramonita Lab Treating Jayan Raymundo/Extender: Melburn Hake, HOYT Weeks in Treatment: 3 HBO Safety Checklist Items Safety Checklist Consent Form Signed Patient voided / foley secured and emptied When did you last eato n/a Last dose of injectable or oral agent n/a Ostomy pouch emptied and vented if applicable NA All implantable devices assessed, documented and approved NA Intravenous access site secured and place NA Valuables secured Linens and cotton and cotton/polyester blend (less than 51% polyester) Personal oil-based products / skin lotions / body lotions removed Wigs or hairpieces removed NA Smoking or tobacco materials removed NA Books / newspapers / magazines / loose paper removed Cologne, aftershave, perfume and deodorant removed Jewelry removed (may wrap wedding band) Make-up removed Hair care products removed Battery operated  devices (external) removed Heating patches and chemical warmers removed Titanium eyewear removed Nail polish cured greater than 10 hours NA Casting material cured greater than 10 hours NA Hearing aids removed Loose dentures or partials removed NA Prosthetics have been removed NA Patient demonstrates correct use of air break device (if applicable) Patient concerns  have been addressed Patient grounding bracelet on and cord attached to chamber Specifics for Inpatients (complete in addition to above) Medication sheet sent with patient Intravenous medications needed or due during therapy sent with patient Drainage tubes (e.g. nasogastric tube or chest tube secured and vented) Endotracheal or Tracheotomy tube secured Cuff deflated of air and inflated with saline Airway suctioned Electronic Signature(s) Signed: 06/14/2018 4:23:53 PM By: Lorine Bears RCP, RRT, CHT [place], [place] R. ([id number]) Entered By: Lorine Bears on 06/14/2018 12:25:34

## 2018-06-16 NOTE — Progress Notes (Addendum)
[name], [name] ([id number]) Visit Report for 06/15/2018 Arrival Information Details Patient Name: [name], [name]. Date of Service: 06/15/2018 8:15 AM Medical Record Number: [id number] Patient Account Number: [id number] Date of Birth/Sex: Feb 03, 1941 (77 y.o. F) Treating RN: Cornell Barman Primary Care Madysun Thall: Ramonita Lab Other Clinician: Jacqulyn Bath Referring Sharalee Witman: Ramonita Lab Treating Aldair Rickel/Extender: Melburn Hake, HOYT Weeks in Treatment: 3 Visit Information History Since Last Visit Pain Present Now: No Patient Arrived: Cane Arrival Time: 08:40 Accompanied By: husband Transfer Assistance: None Patient Identification Verified: Yes Secondary Verification Process Completed: Yes Electronic Signature(s) Signed: 06/15/2018 8:41:03 AM By: Gretta Cool, BSN, RN, CWS, Kim RN, BSN Entered By: Gretta Cool, BSN, RN, CWS, Kim on 06/15/2018 08:41:03 [name] ([id number]) -------------------------------------------------------------------------------- Encounter Discharge Information Details Patient Name: [name]. Date of Service: 06/15/2018 8:15 AM Medical Record Number: [id number] Patient Account Number: [id number] Date of Birth/Sex: Dec 20, 1940 (77 y.o. F) Treating RN: Montey Hora Primary Care Folasade Mooty: Ramonita Lab Other Clinician: Jacqulyn Bath Referring Rindy Kollman: Ramonita Lab Treating Kindel Rochefort/Extender: Melburn Hake, HOYT Weeks in Treatment: 3 Encounter Discharge Information Items Discharge Condition: Stable Ambulatory Status: Cane Discharge Destination: Home Transportation: Private Auto Accompanied By: husband Schedule Follow-up Appointment: No Clinical Summary of Care: Notes Patient finished her 10 prescribed HBO treatments today. Electronic Signature(s) Signed: 06/16/2018 11:20:12 AM By: Lorine Bears RCP, RRT, CHT Entered By: Lorine Bears on 06/15/2018 11:22:01 [name]  ([id number]) -------------------------------------------------------------------------------- Patient/Caregiver Education Details Patient Name: [name]. Date of Service: 06/15/2018 8:15 AM Medical Record Number: [id number] Patient Account Number: [id number] Date of Birth/Gender: [birth year] (77 y.o. F) Treating RN: Montey Hora Primary Care Physician: Ramonita Lab Other Clinician: Jacqulyn Bath Referring Physician: Ramonita Lab Treating Physician/Extender: Sharalyn Ink in Treatment: 3 Education Assessment Education Provided To: Patient Education Topics Provided Hyperbaric Oxygenation: Responses: State content correctly Electronic Signature(s) Signed: 06/16/2018 11:20:12 AM By: Lorine Bears RCP, RRT, CHT Entered By: Becky Sax, Amado Nash on 06/15/2018 11:21:06

## 2018-06-16 NOTE — Progress Notes (Addendum)
SALWA, BAI ([id number]) Visit Report for 06/15/2018 HBO Details Patient Name: [name], [name]. Date of Service: 06/15/2018 8:15 AM Medical Record Number: [id number] Patient Account Number: [id number] Date of Birth/Sex: [birth year] (77 y.o. 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) HBO Treatment Details Treatment Number: 10 Patient Type: Outpatient Chamber Type: Monoplace Chamber Serial #: [id number] Treatment Protocol: 2.0 ATA with 90 minutes oxygen, with two 5 minute air breaks Treatment Details Compression Rate Down: 1.0 psi / minute De-Compression Rate Up: 1.5 psi / minute Compress Tx Pressure Air breaks and breathing periods Decompress Decompress Begins Reached (leave unused spaces blank) Begins Ends Chamber Pressure (ATA) 1 2 2 2 2 2  --2 1 Clock Time (24 hr) 08:29 08:45 09:16 09:52 - - - - 10:29 10:37 Treatment Length: 128 (minutes) Treatment Segments: 4 Capillary Blood Glucose Pre Capillary Blood Glucose (mg/dl): Post Capillary Blood Glucose (mg/dl): Vital Signs Capillary Blood Glucose Reference Range: 80 - 120 mg / dl HBO Diabetic Blood Glucose Intervention Range: <131 mg/dl or >249 mg/dl Time Vitals Blood Respiratory Capillary Blood Glucose Pulse Action Type: Pulse: Temperature: Taken: Pressure: Rate: Glucose (mg/dl): Meter #: Oximetry (%) Taken: Post 10:45 112/62 72 16 98.2 Treatment Response Treatment Toleration: Well Treatment Completion Treatment Completed without Adverse Event Status: Yehya Brendle Notes Currently patient has completed her HBO treatment with no adverse side effects.  Overall I have been very pleased with how she tolerated the treatment itself she's had no complications. The question is whether or not she had any improvement in her hearing during this time. She lost her hearing aid about the same time that this happened as far as the acute sudden hearing loss of her left ear is concerned. Nonetheless she has audiology testing this coming Thursday will see the results of the HBO therapy at that time. HBO Attestation [place] ([id number]) I certify that I supervised this HBO treatment in accordance Yes with Medicare guidelines. A trained emergency response team is readily available per hospital policies and procedures. Continue HBOT as ordered. Yes Electronic Signature(s) Signed: 07/04/2018 11:36:14 PM By: Worthy Keeler PA-C Signed: 07/29/2018 10:27:51 AM By: Lorine Bears RCP, RRT, CHT Previous Signature: 06/15/2018 5:24:21 PM Version By: Worthy Keeler PA-C Previous Signature: 06/15/2018 10:40:11 AM Version By: Gretta Cool, BSN, RN, CWS, Kim RN, BSN Previous Signature: 06/15/2018 10:29:31 AM Version By: Gretta Cool, BSN, RN, CWS, Kim RN, BSN Previous Signature: 06/15/2018 10:02:38 AM Version By: Gretta Cool BSN, RN, CWS, Kim RN, BSN Previous Signature: 06/15/2018 8:46:51 AM Version By: Gretta Cool BSN, RN, CWS, Kim RN, BSN Previous Signature: 06/15/2018 8:45:33 AM Version By: Gretta Cool, BSN, RN, CWS, Kim RN, BSN Entered By: Lorine Bears on 06/28/2018 10:42:35 MAS[place], PE ([id number]) -------------------------------------------------------------------------------- HBO Safety Checklist Details Patient Name: MAITRI, SCHNOEBELEN. Date of Service: 06/15/2018 8:15 AM Medical Record Number: [id number] Patient Account Number: [id number] Date of Birth/Sex: September 28, 1940 (77 y.o. F) Treating RN: Cornell Barman Primary Care Camiah Humm: Ramonita Lab Other Clinician: Jacqulyn Bath Referring Valina Maes: Ramonita Lab Treating Mikia Delaluz/Extender: Melburn Hake,  HOYT Weeks in Treatment: 3 HBO Safety Checklist Items Safety Checklist Consent Form Signed Patient voided / foley secured and emptied When did you last eato Am Last dose of injectable or oral agent NA  Ostomy pouch emptied and vented if applicable NA All implantable devices assessed, documented and approved NA Intravenous access site secured and place NA Valuables secured Linens and cotton and cotton/polyester blend (less than 51% polyester) Personal oil-based products / skin lotions / body lotions removed Wigs or hairpieces removed NA Smoking or tobacco materials removed Books / newspapers / magazines / loose paper removed Cologne, aftershave, perfume and deodorant removed Jewelry removed (may wrap wedding band) Make-up removed Hair care products removed Battery operated devices (external) removed NA Heating patches and chemical warmers removed NA Titanium eyewear removed Nail polish cured greater than 10 hours Casting material cured greater than 10 hours NA Hearing aids removed Loose dentures or partials removed NA Prosthetics have been removed NA Patient demonstrates correct use of air break device (if applicable) Patient concerns have been addressed NA NA Patient grounding bracelet on and cord attached to chamber Specifics for Inpatients (complete in addition to above) Medication sheet sent with patient Intravenous medications needed or due during therapy sent with patient Drainage tubes (e.g. nasogastric tube or chest tube secured and vented) Endotracheal or Tracheotomy tube secured Cuff deflated of air and inflated with saline Airway suctioned Electronic Signature(s) Signed: 06/15/2018 8:42:44 AM By: Gretta Cool, BSN, RN, CWS, Kim RN, BSN [place], [place] ([id number]) Entered By: Gretta Cool, BSN, RN, CWS, Kim on 06/15/2018 08:42:43

## 2018-06-17 ENCOUNTER — Other Ambulatory Visit: Payer: Self-pay | Admitting: *Deleted

## 2018-06-17 DIAGNOSIS — Z79899 Other long term (current) drug therapy: Principal | ICD-10-CM

## 2018-06-17 DIAGNOSIS — Z5181 Encounter for therapeutic drug level monitoring: Secondary | ICD-10-CM

## 2018-06-17 NOTE — Progress Notes (Signed)
[name], [name] ([id number]) Visit Report for 06/15/2018 Problem List Details Patient Name: [name], [name]. Date of Service: 06/15/2018 8:15 AM Medical Record Number: [id number] Patient Account Number: [id number] Date of Birth/Sex: 06-12-1941 (77 y.o. 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 Date of Service: 06/15/2018 Medical Record Number: [id number] Patient Account Number: [id number] Date of Birth/Sex: 02-22-1941 (77 y.o. 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 C50.812 Malignant neoplasm  of overlapping sites of left female breast Facility Procedures CPT4 Code: 37342876 Description: (Facility Use Only) HBOT, full body chamber, 54min Modifier: Quantity: 4 Physician Procedures CPT4: Description Modifier Quantity Code 8115726 20355 - WC PHYS HYPERBARIC OXYGEN THERAPY 1 ICD-10 Diagnosis Description 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

## 2018-06-28 ENCOUNTER — Encounter: Payer: Medicare Other | Attending: Physician Assistant | Admitting: Physician Assistant

## 2018-06-28 DIAGNOSIS — C50812 Malignant neoplasm of overlapping sites of left female breast: Secondary | ICD-10-CM | POA: Insufficient documentation

## 2018-06-28 DIAGNOSIS — C773 Secondary and unspecified malignant neoplasm of axilla and upper limb lymph nodes: Secondary | ICD-10-CM | POA: Insufficient documentation

## 2018-06-28 DIAGNOSIS — H90A22 Sensorineural hearing loss, unilateral, left ear, with restricted hearing on the contralateral side: Secondary | ICD-10-CM | POA: Diagnosis not present

## 2018-06-29 ENCOUNTER — Encounter: Payer: Medicare Other | Admitting: Physician Assistant

## 2018-06-29 DIAGNOSIS — H90A22 Sensorineural hearing loss, unilateral, left ear, with restricted hearing on the contralateral side: Secondary | ICD-10-CM | POA: Diagnosis not present

## 2018-06-29 NOTE — Progress Notes (Signed)
LOGAN, VEGH ([id number]) Visit Report for 06/28/2018 Arrival Information Details Patient Name: [name], [name]. Date of Service: 06/28/2018 10:30 AM Medical Record Number: [id number] Patient Account Number: [id number] Date of Birth/Sex: [birth year] (77 y.o. F) Treating RN: Primary Care Jalani Cullifer: Ramonita Lab Other Clinician: Referring Gwendolyn Nishi: Ramonita Lab Treating Eddy Termine/Extender: Melburn Hake, HOYT Weeks in Treatment: 5 Visit Information History Since Last Visit Added or deleted any medications: No Patient Arrived: Walker Any new allergies or adverse reactions: No Arrival Time: 10:20 Had a fall or experienced change in No Accompanied By: husband activities of daily living that may affect Transfer Assistance: None risk of falls: Patient Identification Verified: Yes Signs or symptoms of abuse/neglect since last visito No Secondary Verification Process Completed: Yes Hospitalized since last visit: No Implantable device outside of the clinic excluding No cellular tissue based products placed in the center since last visit: Pain Present Now: No Electronic Signature(s) Signed: 06/28/2018 3:39:04 PM By: Lorine Bears RCP, RRT, CHT Entered By: Lorine Bears on 06/28/2018 10:38:30 [name] ([id number]) -------------------------------------------------------------------------------- Encounter Discharge Information Details Patient Name: [name]. Date of Service: 06/28/2018 10:30 AM Medical Record Number: [id number] Patient Account Number: [id number] Date of Birth/Sex: [birth year] (77 y.o. F) Treating RN: Primary Care Keymiah Lyles: Ramonita Lab Other Clinician: Referring Sebastian Dzik: Ramonita Lab Treating Itzae Miralles/Extender: Melburn Hake, HOYT Weeks in Treatment: 5 Encounter Discharge Information Items Discharge Condition: Stable Ambulatory Status: Walker Discharge Destination: Home Transportation: Private Auto Accompanied By: husband Schedule  Follow-up Appointment: No Clinical Summary of Care: Notes Patient has an HBO treatment scheduled on 06/29/18 at 10:30 am. Electronic Signature(s) Signed: 06/28/2018 3:39:04 PM By: Lorine Bears RCP, RRT, CHT Entered By: Lorine Bears on 06/28/2018 15:33:39 [name] ([id number]) -------------------------------------------------------------------------------- Patient/Caregiver Education Details Patient Name: [name] Date of Service: 06/28/2018 10:30 AM Medical Record Number: [id number] Patient Account Number: [id number] Date of Birth/Gender: October 13, 1940 (77 y.o. F) Treating RN: Primary Care Physician: Ramonita Lab Other Clinician: Referring Physician: Ramonita Lab Treating Physician/Extender: Sharalyn Ink in Treatment: 5 Education Assessment Education Provided To: Patient Education Topics Provided Hyperbaric Oxygenation: Responses: State content correctly Electronic Signature(s) Signed: 06/28/2018 3:39:04 PM By: Lorine Bears RCP, RRT, CHT Entered By: Becky Sax, Amado Nash on 06/28/2018 15:32:46 [name] ([id number]) -------------------------------------------------------------------------------- Vitals Details Patient Name: [name]. Date of Service: 06/28/2018 10:30 AM Medical Record Number: [id number] Patient Account Number: [id number] Date of Birth/Sex: [birth year] (77 y.o. F) Treating RN: Primary Care Vanessa Kampf: Ramonita Lab Other Clinician: Referring Jameka Ivie: Ramonita Lab Treating Leroy Pettway/Extender: Melburn Hake, HOYT Weeks in Treatment: 5 Vital Signs Time Taken: 10:25 Pulse (bpm): 72 Height (in): 64 Respiratory Rate (breaths/min): 16 Weight (lbs): 164 Blood Pressure (mmHg): 108/64 Body Mass Index (BMI): 28.1 Reference Range: 80 - 120 mg / dl Electronic Signature(s) Signed: 06/28/2018 3:39:04 PM By: Lorine Bears RCP, RRT, CHT Entered By: Becky Sax, Amado Nash on  06/28/2018 10:39:24

## 2018-06-30 ENCOUNTER — Encounter: Payer: Medicare Other | Admitting: Internal Medicine

## 2018-06-30 DIAGNOSIS — H90A22 Sensorineural hearing loss, unilateral, left ear, with restricted hearing on the contralateral side: Secondary | ICD-10-CM | POA: Diagnosis not present

## 2018-06-30 NOTE — Progress Notes (Signed)
YACHET, MATTSON ([id number]) Visit Report for 06/28/2018 HBO Details Patient Name: [name], [name]. Date of Service: 06/28/2018 10:30 AM Medical Record Number: [id number] Patient Account Number: [id number] Date of Birth/Sex: 1941/04/19 (77 y.o. 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) HBO Treatment Details Treatment Number: 11 Patient Type: Outpatient Chamber Type: Monoplace Chamber Serial #: [id number] Treatment Protocol: 2.0 ATA with 90 minutes oxygen, with two 5 minute air breaks Treatment Details Compression Rate Down: 1.0 psi / minute De-Compression Rate Up: 1.5 psi / minute Compress Tx Pressure Air breaks and breathing periods Decompress Decompress Begins Reached (leave unused spaces blank) Begins Ends Chamber Pressure (ATA) 1 2 2 2 2 2  --2 1 Clock Time (24 hr) 10:35 10:52 11:22 11:27 11:58 12:04 - - 12:34 12:43 Treatment Length: 128 (minutes) Treatment Segments: 4 Capillary Blood Glucose Pre Capillary Blood Glucose (mg/dl): Post Capillary Blood Glucose (mg/dl): Vital Signs Capillary Blood Glucose Reference Range: 80 - 120 mg / dl HBO Diabetic Blood Glucose Intervention Range: <131 mg/dl or >249 mg/dl Time Vitals Blood Respiratory Capillary Blood Glucose Pulse Action Type: Pulse: Temperature: Taken: Pressure: Rate: Glucose (mg/dl): Meter #: Oximetry (%) Taken: Pre 10:25 108/64 72 16 Post 12:50 110/70 72 16 97.9 Treatment Response Treatment Toleration: Well Treatment Completion Treatment Completed without Adverse Event Status: Tyreisha Ungar Notes Patient tolerated therapy without complication. HBO Attestation I certify  that I supervised this HBO treatment in accordance with Medicare guidelines. A trained emergency response Yes team is readily available per hospital policies and procedures. BLAKE, GOYA R. ([id number]) Continue HBOT as ordered. Yes Electronic Signature(s) Signed: 06/29/2018 9:01:12 AM By: Worthy Keeler PA-C Previous Signature: 06/28/2018 3:39:04 PM Version By: Lorine Bears RCP, RRT, CHT Entered By: Worthy Keeler on 06/29/2018 08:45:59 [name] ([id number]) -------------------------------------------------------------------------------- HBO Safety Checklist Details Patient Name: [name]. Date of Service: 06/28/2018 10:30 AM Medical Record Number: [id number] Patient Account Number: [id number] Date of Birth/Sex: 1940-10-05 (77 y.o. F) Treating RN: Primary Care Robertta Halfhill: Ramonita Lab Other Clinician: Referring Tannon Peerson: Ramonita Lab Treating Anet Logsdon/Extender: Melburn Hake, HOYT Weeks in Treatment: 5 HBO Safety Checklist Items Safety Checklist Consent Form Signed Patient voided / foley secured and emptied When did you last eato 06/28/18 am Last dose of injectable or oral agent n/a Ostomy pouch emptied and vented if applicable NA All implantable devices assessed, documented and approved NA Intravenous access site secured and place NA Valuables secured Linens and cotton and cotton/polyester blend (less than 51% polyester) Personal oil-based products / skin lotions / body lotions removed Wigs or hairpieces removed NA Smoking or tobacco materials removed NA Books / newspapers / magazines / loose paper removed Cologne, aftershave, perfume and deodorant removed Jewelry removed (may wrap wedding band) Make-up removed Hair care products removed Battery operated devices (external) removed Heating patches and chemical warmers removed Titanium eyewear removed Nail polish cured greater than 10 hours NA Casting material cured greater than 10  hours NA Hearing aids removed Loose dentures or partials removed NA Prosthetics have been removed NA Patient demonstrates correct use of air break device (if applicable) Patient concerns have been addressed Patient grounding bracelet on and cord attached to chamber Specifics for Inpatients (complete in addition to above) Medication sheet sent with patient Intravenous medications  needed or due during therapy sent with patient Drainage tubes (e.g. nasogastric tube or chest tube secured and vented) Endotracheal or Tracheotomy tube secured Cuff deflated of air and inflated with saline Airway suctioned Electronic Signature(s) Signed: 06/28/2018 3:39:04 PM By: Lorine Bears RCP, RRT, CHT [place], [place] R. ([id number]) Entered By: Lorine Bears on 06/28/2018 10:40:36

## 2018-06-30 NOTE — Progress Notes (Addendum)
MAIRIM, BADE ([id number]) Visit Report for 06/28/2018 Problem List Details Patient Name: [name], [name]. Date of Service: 06/28/2018 10:30 AM Medical Record Number: [id number] Patient Account Number: [id number] Date of Birth/Sex: March 19, 1941 (77 y.o. 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 Date of Service: 06/28/2018 Medical Record Number: [id number] Patient Account Number: [id number] Date of Birth/Sex: 1941-01-01 (77 y.o. 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 breast Facility  Procedures CPT4 Code: 38882800 Description: (Facility Use Only) HBOT, full body chamber, 30min Modifier: Quantity: 4 Physician Procedures CPT4: Description Modifier Quantity Code 3491791 50569 - WC PHYS HYPERBARIC OXYGEN THERAPY 1 ICD-10 Diagnosis Description 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

## 2018-07-01 ENCOUNTER — Encounter: Payer: Medicare Other | Admitting: Physician Assistant

## 2018-07-01 DIAGNOSIS — H90A22 Sensorineural hearing loss, unilateral, left ear, with restricted hearing on the contralateral side: Secondary | ICD-10-CM | POA: Diagnosis not present

## 2018-07-02 ENCOUNTER — Encounter: Payer: Medicare Other | Attending: Physician Assistant | Admitting: Physician Assistant

## 2018-07-02 ENCOUNTER — Encounter
Admission: RE | Admit: 2018-07-02 | Discharge: 2018-07-02 | Disposition: A | Discharge status: Home / Self Care | Payer: Medicare Other | Source: Ambulatory Visit | Attending: Internal Medicine | Admitting: Internal Medicine

## 2018-07-02 DIAGNOSIS — H9122 Sudden idiopathic hearing loss, left ear: Secondary | ICD-10-CM | POA: Diagnosis present

## 2018-07-02 DIAGNOSIS — C773 Secondary and unspecified malignant neoplasm of axilla and upper limb lymph nodes: Secondary | ICD-10-CM | POA: Insufficient documentation

## 2018-07-02 DIAGNOSIS — Z5181 Encounter for therapeutic drug level monitoring: Secondary | ICD-10-CM | POA: Insufficient documentation

## 2018-07-02 DIAGNOSIS — C50919 Malignant neoplasm of unspecified site of unspecified female breast: Secondary | ICD-10-CM | POA: Diagnosis not present

## 2018-07-02 DIAGNOSIS — C50812 Malignant neoplasm of overlapping sites of left female breast: Secondary | ICD-10-CM | POA: Insufficient documentation

## 2018-07-02 DIAGNOSIS — Z79899 Other long term (current) drug therapy: Secondary | ICD-10-CM | POA: Diagnosis present

## 2018-07-02 DIAGNOSIS — H90A22 Sensorineural hearing loss, unilateral, left ear, with restricted hearing on the contralateral side: Secondary | ICD-10-CM | POA: Diagnosis not present

## 2018-07-02 MED ORDER — TECHNETIUM TC 99M-LABELED RED BLOOD CELLS IV KIT
20.0000 | PACK | Freq: Once | INTRAVENOUS | Status: AC | PRN
  Administered 2018-07-02: 25.037 via INTRAVENOUS

## 2018-07-02 NOTE — Progress Notes (Signed)
IRA, BUSBIN ([id number]) Visit Report for 06/30/2018 Arrival Information Details Patient Name: [name], [name]. Date of Service: 06/30/2018 10:30 AM Medical Record Number: [id number] Patient Account Number: [id number] Date of Birth/Sex: [birth year] (77 y.o. F) Treating RN: Cornell Barman Primary Care Makenize Messman: Ramonita Lab Other Clinician: Jacqulyn Bath Referring Iktan Aikman: Ramonita Lab Treating Lonzell Dorris/Extender: Tito Dine in Treatment: 5 Visit Information History Since Last Visit Added or deleted any medications: No Patient Arrived: Gilford Rile Any new allergies or adverse reactions: No Arrival Time: 10:15 Had a fall or experienced change in No Accompanied By: husband activities of daily living that may affect Transfer Assistance: None risk of falls: Patient Identification Verified: Yes Signs or symptoms of abuse/neglect since last visito No Secondary Verification Process Completed: Yes Hospitalized since last visit: No Implantable device outside of the clinic excluding No cellular tissue based products placed in the center since last visit: Pain Present Now: No Electronic Signature(s) Signed: 06/30/2018 3:56:23 PM By: Lorine Bears RCP, RRT, CHT Entered By: Lorine Bears on 06/30/2018 10:31:22 [name] ([id number]) -------------------------------------------------------------------------------- Encounter Discharge Information Details Patient Name: [name]. Date of Service: 06/30/2018 10:30 AM Medical Record Number: [id number] Patient Account Number: [id number] Date of Birth/Sex: September 16, 1940 (77 y.o. F) Treating RN: Cornell Barman Primary Care Xylon Croom: Ramonita Lab Other Clinician: Jacqulyn Bath Referring Jonnatan Hanners: Ramonita Lab Treating Cebert Dettmann/Extender: Tito Dine in Treatment: 5 Encounter Discharge Information Items Discharge Condition: Stable Ambulatory Status: Walker Discharge Destination:  Home Transportation: Private Auto Accompanied By: husband Schedule Follow-up Appointment: No Clinical Summary of Care: Notes Patient has an HBO treatment scheduled on 07/01/18 at 10:30 am. Electronic Signature(s) Signed: 06/30/2018 3:56:23 PM By: Lorine Bears RCP, RRT, CHT Entered By: Lorine Bears on 06/30/2018 12:52:52 [name] ([id number]) -------------------------------------------------------------------------------- Patient/Caregiver Education Details Patient Name: [name]. Date of Service: 06/30/2018 10:30 AM Medical Record Number: [id number] Patient Account Number: [id number] Date of Birth/Gender: Oct 16, 1940 (77 y.o. F) Treating RN: Cornell Barman Primary Care Physician: Ramonita Lab Other Clinician: Jacqulyn Bath Referring Physician: Ramonita Lab Treating Physician/Extender: Tito Dine in Treatment: 5 Education Assessment Education Provided To: Patient Education Topics Provided Hyperbaric Oxygenation: Responses: State content correctly Electronic Signature(s) Signed: 06/30/2018 3:56:23 PM By: Lorine Bears RCP, RRT, CHT Entered By: Lorine Bears on 06/30/2018 12:51:04 [name] ([id number]) -------------------------------------------------------------------------------- Vitals Details Patient Name: [name] Date of Service: 06/30/2018 10:30 AM Medical Record Number: [id number] Patient Account Number: [id number] Date of Birth/Sex: [birth year] (77 y.o. F) Treating RN: Cornell Barman Primary Care Edsel Shives: Ramonita Lab Other Clinician: Jacqulyn Bath Referring Infant Doane: Ramonita Lab Treating Aqueelah Cotrell/Extender: Tito Dine in Treatment: 5 Vital Signs Time Taken: 10:25 Temperature (F): 97.9 Height (in): 64 Pulse (bpm): 66 Weight (lbs): 164 Respiratory Rate (breaths/min): 18 Body Mass Index (BMI): 28.1 Blood Pressure (mmHg): 120/70 Reference Range: 80 - 120  mg / dl Electronic Signature(s) Signed: 06/30/2018 3:56:23 PM By: Lorine Bears RCP, RRT, CHT Entered By: Lorine Bears on 06/30/2018 10:31:56

## 2018-07-03 NOTE — Progress Notes (Signed)
[name] ([id number]) Visit Report for 07/01/2018 Arrival Information Details Patient Name: [name], [name]. Date of Service: 07/01/2018 10:30 AM Medical Record Number: [id number] Patient Account Number: [id number] Date of Birth/Sex: Mar 15, 1941 (77 y.o. F) Treating RN: Harold Barban Primary Care Ashante Snelling: Ramonita Lab Other Clinician: Jacqulyn Bath Referring Andi Mahaffy: Ramonita Lab Treating Gaudencio Chesnut/Extender: Melburn Hake, HOYT Weeks in Treatment: 5 Visit Information History Since Last Visit Added or deleted any medications: No Patient Arrived: Walker Any new allergies or adverse reactions: No Arrival Time: 10:19 Had a fall or experienced change in No Accompanied By: husband activities of daily living that may affect Transfer Assistance: None risk of falls: Patient Identification Verified: Yes Signs or symptoms of abuse/neglect since last visito No Secondary Verification Process Completed: Yes Hospitalized since last visit: No Implantable device outside of the clinic excluding No cellular tissue based products placed in the center since last visit: Pain Present Now: No Electronic Signature(s) Signed: 07/01/2018 1:06:46 PM By: Lorine Bears RCP, RRT, CHT Entered By: Lorine Bears on 07/01/2018 10:33:08 [name] ([id number]) -------------------------------------------------------------------------------- Encounter Discharge Information Details Patient Name: [name]. Date of Service: 07/01/2018 10:30 AM Medical Record Number: [id number] Patient Account Number: [id number] Date of Birth/Sex: 04/09/1941 (77 y.o. F) Treating RN: Harold Barban Primary Care Namiah Dunnavant: Ramonita Lab Other Clinician: Jacqulyn Bath Referring Norton Bivins: Ramonita Lab Treating Merina Behrendt/Extender: Melburn Hake, HOYT Weeks in Treatment: 5 Encounter Discharge Information Items Discharge Condition: Stable Ambulatory Status: Ambulatory Discharge Destination:  Home Transportation: Private Auto Accompanied By: husband Schedule Follow-up Appointment: No Clinical Summary of Care: Notes Patient has an HBO treatment scheduled on 07/02/18 at 10:30 am. Electronic Signature(s) Signed: 07/01/2018 1:06:46 PM By: Lorine Bears RCP, RRT, CHT Entered By: Lorine Bears on 07/01/2018 13:06:25 [name] ([id number]) -------------------------------------------------------------------------------- Patient/Caregiver Education Details Patient Name: [name]. Date of Service: 07/01/2018 10:30 AM Medical Record Number: [id number] Patient Account Number: [id number] Date of Birth/Gender: 02/24/1941 (77 y.o. F) Treating RN: Harold Barban Primary Care Physician: Ramonita Lab Other Clinician: Jacqulyn Bath Referring Physician: Ramonita Lab Treating Physician/Extender: Sharalyn Ink in Treatment: 5 Education Assessment Education Provided To: Patient Education Topics Provided Hyperbaric Oxygenation: Responses: State content correctly Electronic Signature(s) Signed: 07/01/2018 1:06:46 PM By: Lorine Bears RCP, RRT, CHT Entered By: Lorine Bears on 07/01/2018 13:05:49 [name] ([id number]) -------------------------------------------------------------------------------- Vitals Details Patient Name: [name] Date of Service: 07/01/2018 10:30 AM Medical Record Number: [id number] Patient Account Number: [id number] Date of Birth/Sex: [birth year] (77 y.o. F) Treating RN: Harold Barban Primary Care Jaquetta Currier: Ramonita Lab Other Clinician: Jacqulyn Bath Referring Kanon Novosel: Ramonita Lab Treating Nyshawn Gowdy/Extender: Melburn Hake, HOYT Weeks in Treatment: 5 Vital Signs Time Taken: 10:25 Pulse (bpm): 72 Height (in): 64 Respiratory Rate (breaths/min): 16 Weight (lbs): 164 Blood Pressure (mmHg): 122/60 Body Mass Index (BMI): 28.1 Reference Range: 80 - 120 mg / dl Electronic  Signature(s) Signed: 07/01/2018 1:06:46 PM By: Lorine Bears RCP, RRT, CHT Entered By: Lorine Bears on 07/01/2018 10:33:54

## 2018-07-03 NOTE — Progress Notes (Signed)
NEVIN, KOZUCH ([id number]) Visit Report for 06/29/2018 Problem List Details Patient Name: JAUNICE, MIRZA. Date of Service: 06/29/2018 10:30 AM Medical Record Number: [id number] Patient Account Number: [id number] Date of Birth/Sex: 01-Nov-1940 (77 y.o. 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 Date of Service: 06/29/2018 Medical Record Number: [id number] Patient Account Number: [id number] Date of Birth/Sex: [birth year] (77 y.o. 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 C50.812 Malignant neoplasm of  overlapping sites of left female breast Facility Procedures CPT4 Code: 23953202 Description: (Facility Use Only) HBOT, full body chamber, 72min Modifier: Quantity: 4 Physician Procedures CPT4: Description Modifier Quantity Code 3343568 [id number] - WC PHYS HYPERBARIC OXYGEN THERAPY 1 ICD-10 Diagnosis Description 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

## 2018-07-03 NOTE — Progress Notes (Signed)
[name] ([id number]) Visit Report for 06/29/2018 HBO Details Patient Name: [name], [name]. Date of Service: 06/29/2018 10:30 AM Medical Record Number: [id number] Patient Account Number: [id number] Date of Birth/Sex: 28-Jan-1941 (77 y.o. 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) HBO Treatment Details Treatment Number: 12 Patient Type: Outpatient Chamber Type: Monoplace Chamber Serial #: [id number] Treatment Protocol: 2.0 ATA with 90 minutes oxygen, with two 5 minute air breaks Treatment Details Compression Rate Down: 1.0 psi / minute De-Compression Rate Up: 2.0 psi / minute Compress Tx Pressure Air breaks and breathing periods Decompress Decompress Begins Reached (leave unused spaces blank) Begins Ends Chamber Pressure (ATA) 1 2 2 2 2 2  --2 1 Clock Time (24 hr) 10:34 10:51 11:21 11:26 11:57 12:02 - - 12:30 12:40 Treatment Length: 126 (minutes) Treatment Segments: 4 Capillary Blood Glucose Pre Capillary Blood Glucose (mg/dl): Post Capillary Blood Glucose (mg/dl): Vital Signs Capillary Blood Glucose Reference Range: 80 - 120 mg / dl HBO Diabetic Blood Glucose Intervention Range: <131 mg/dl or >249 mg/dl Time Vitals Blood Respiratory Capillary Blood Glucose Pulse Action Type: Pulse: Temperature: Taken: Pressure: Rate: Glucose (mg/dl): Meter #: Oximetry (%) Taken: Pre 10:25 110/64 78 18 Post 12:45 106/70 78 16 97.9 Treatment Response Treatment Completion Status: Treatment Completed without Adverse Event HBO Attestation I certify that I supervised this HBO treatment in accordance with  Medicare guidelines. A trained emergency response Yes team is readily available per hospital policies and procedures. Continue HBOT as ordered. Yes Electronic Signature(s) KATALENA, MALVEAUX ([id number]) Signed: 07/01/2018 1:15:36 AM By: Worthy Keeler PA-C Previous Signature: 06/29/2018 4:26:47 PM Version By: Lorine Bears RCP, RRT, CHT Entered By: Worthy Keeler on 06/30/2018 21:24:30 [name] ([id number]) -------------------------------------------------------------------------------- HBO Safety Checklist Details Patient Name: [name]. Date of Service: 06/29/2018 10:30 AM Medical Record Number: [id number] Patient Account Number: [id number] Date of Birth/Sex: [birth year] (77 y.o. F) Treating RN: Montey Hora Primary Care Darnisha Vernet: Ramonita Lab Other Clinician: Jacqulyn Bath Referring Tava Peery: Ramonita Lab Treating Safari Cinque/Extender: Melburn Hake, HOYT Weeks in Treatment: 5 HBO Safety Checklist Items Safety Checklist Consent Form Signed Patient voided / foley secured and emptied When did you last eato 06/29/18 am Last dose of injectable or oral agent n/a Ostomy pouch emptied and vented if applicable NA All implantable devices assessed, documented and approved NA Intravenous access site secured and place NA Valuables secured Linens and cotton and cotton/polyester blend (less than 51% polyester) Personal oil-based products / skin lotions / body lotions removed Wigs or hairpieces removed NA Smoking or tobacco materials removed NA Books / newspapers / magazines / loose paper removed Cologne, aftershave, perfume and deodorant removed Jewelry removed (may wrap wedding band) Make-up removed Hair care products removed Battery operated devices (external) removed Heating patches and chemical warmers removed Titanium eyewear removed Nail polish cured greater than 10 hours NA Casting material cured greater than 10 hours NA Hearing aids removed Loose  dentures or partials removed NA Prosthetics have been removed NA Patient demonstrates correct use of air break device (if applicable) Patient concerns have been addressed Patient grounding bracelet on and cord attached to chamber Specifics for Inpatients (complete in addition to above) Medication sheet sent with patient Intravenous medications needed or  due during therapy sent with patient Drainage tubes (e.g. nasogastric tube or chest tube secured and vented) Endotracheal or Tracheotomy tube secured Cuff deflated of air and inflated with saline Airway suctioned Electronic Signature(s) Signed: 06/29/2018 4:26:47 PM By: Lorine Bears RCP, RRT, CHT [place], [place] R. ([id number]) Entered By: Lorine Bears on 06/29/2018 10:41:45

## 2018-07-03 NOTE — Progress Notes (Signed)
[name] ([id number]) Visit Report for 06/30/2018 HBO Details Patient Name: [name], [name]. Date of Service: 06/30/2018 10:30 AM Medical Record Number: [id number] Patient Account Number: [id number] Date of Birth/Sex: 1941-06-07 (77 y.o. 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) HBO Treatment Details Treatment Number: 13 Patient Type: Outpatient Chamber Type: Monoplace Chamber Serial #: [id number] Treatment Protocol: 2.0 ATA with 90 minutes oxygen, with two 5 minute air breaks Treatment Details Compression Rate Down: 1.0 psi / minute De-Compression Rate Up: 2.0 psi / minute Compress Tx Pressure Air breaks and breathing periods Decompress Decompress Begins Reached (leave unused spaces blank) Begins Ends Chamber Pressure (ATA) 1 2 2 2 2 2  --2 1 Clock Time (24 hr) 10:29 10:46 11:17 11:22 11:53 11:58 - - 12:28 12:36 Treatment Length: 127 (minutes) Treatment Segments: 4 Capillary Blood Glucose Pre Capillary Blood Glucose (mg/dl): Post Capillary Blood Glucose (mg/dl): Vital Signs Capillary Blood Glucose Reference Range: 80 - 120 mg / dl HBO Diabetic Blood Glucose Intervention Range: <131 mg/dl or >249 mg/dl Time Vitals Blood Respiratory Capillary Blood Glucose Pulse Action Type: Pulse: Temperature: Taken: Pressure: Rate: Glucose (mg/dl): Meter #: Oximetry (%) Taken: Pre 10:25 120/70 66 18 97.9 Post 12:40 106/70 84 16 97.7 Treatment Response Treatment Completion Status: Treatment Completed without Adverse Event [name] Notes Patient tolerated treatment well HBO Attestation I certify that I  supervised this HBO treatment in accordance with Medicare guidelines. A trained emergency response Yes team is readily available per hospital policies and procedures. Continue HBOT as ordered. Yes [name] ([id number]) Electronic Signature(s) Signed: 06/30/2018 4:50:38 PM By: Linton Ham MD Previous Signature: 06/30/2018 3:56:23 PM Version By: Lorine Bears RCP, RRT, CHT Entered By: Linton Ham on 06/30/2018 16:42:21 [name] ([id number]) -------------------------------------------------------------------------------- HBO Safety Checklist Details Patient Name: [name]. Date of Service: 06/30/2018 10:30 AM Medical Record Number: [id number] Patient Account Number: [id number] Date of Birth/Sex: [birth year] (77 y.o. F) Treating RN: Cornell Barman Primary Care Naziya Hegwood: Ramonita Lab Other Clinician: Jacqulyn Bath Referring Pierce Biagini: Ramonita Lab Treating Jacquez Sheetz/Extender: Tito Dine in Treatment: 5 HBO Safety Checklist Items Safety Checklist Consent Form Signed Patient voided / foley secured and emptied When did you last eato 06/30/18 am Last dose of injectable or oral agent n/a Ostomy pouch emptied and vented if applicable NA All implantable devices assessed, documented and approved NA Intravenous access site secured and place NA Valuables secured Linens and cotton and cotton/polyester blend (less than 51% polyester) Personal oil-based products / skin lotions / body lotions removed Wigs or hairpieces removed NA Smoking or tobacco materials removed NA Books / newspapers / magazines / loose paper removed Cologne, aftershave, perfume and deodorant removed Jewelry removed (may wrap wedding band) Make-up removed NA Hair care products removed Battery operated devices (external) removed Heating patches and chemical warmers removed Titanium eyewear removed Nail polish cured greater than 10 hours NA Casting material cured  greater than 10 hours NA Hearing aids removed Loose dentures or partials removed NA Prosthetics have been removed NA Patient demonstrates correct use of air break device (if applicable) Patient concerns have been addressed Patient grounding bracelet on and cord attached to chamber Specifics for Inpatients (complete in addition to above) Medication sheet sent  with patient Intravenous medications needed or due during therapy sent with patient Drainage tubes (e.g. nasogastric tube or chest tube secured and vented) Endotracheal or Tracheotomy tube secured Cuff deflated of air and inflated with saline Airway suctioned Electronic Signature(s) Signed: 06/30/2018 3:56:23 PM By: Lorine Bears RCP, RRT, CHT [name], [name] ([id number]) Entered By: Lorine Bears on 06/30/2018 10:32:53

## 2018-07-04 NOTE — Progress Notes (Signed)
GIDGET, QUIZHPI ([id number]) Visit Report for 07/01/2018 HBO Details Patient Name: [name], [name]. Date of Service: 07/01/2018 10:30 AM Medical Record Number: [id number] Patient Account Number: [id number] Date of Birth/Sex: 27-Jul-1941 (77 y.o. 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) HBO Treatment Details Treatment Number: 14 Patient Type: Outpatient Chamber Type: Monoplace Chamber Serial #: [id number] Treatment Protocol: 2.0 ATA with 90 minutes oxygen, with two 5 minute air breaks Treatment Details Compression Rate Down: 1.0 psi / minute De-Compression Rate Up: 2.0 psi / minute Compress Tx Pressure Air breaks and breathing periods Decompress Decompress Begins Reached (leave unused spaces blank) Begins Ends Chamber Pressure (ATA) 1 2 2 2 2 2  --2 1 Clock Time (24 hr) 10:31 10:48 11:18 11:23 11:53 11:58 - - 12:30 12:37 Treatment Length: 126 (minutes) Treatment Segments: 4 Capillary Blood Glucose Pre Capillary Blood Glucose (mg/dl): Post Capillary Blood Glucose (mg/dl): Vital Signs Capillary Blood Glucose Reference Range: 80 - 120 mg / dl HBO Diabetic Blood Glucose Intervention Range: <131 mg/dl or >249 mg/dl Time Vitals Blood Respiratory Capillary Blood Glucose Pulse Action Type: Pulse: Temperature: Taken: Pressure: Rate: Glucose (mg/dl): Meter #: Oximetry (%) Taken: Pre 10:25 122/60 72 16 Post 12:47 12/74 72 16 98.6 Treatment Response Treatment Toleration: Well Treatment Completion Treatment Completed without Adverse Event Status: HBO Attestation I certify that I supervised this HBO  treatment in accordance with Medicare guidelines. A trained emergency response Yes team is readily available per hospital policies and procedures. Continue HBOT as ordered. Yes FONDA, ROCHON ([id number]) Electronic Signature(s) Signed: 07/01/2018 9:49:28 PM By: Worthy Keeler PA-C Previous Signature: 07/01/2018 1:06:46 PM Version By: Lorine Bears RCP, RRT, CHT Entered By: Worthy Keeler on 07/01/2018 21:11:57 [name] ([id number]) -------------------------------------------------------------------------------- HBO Safety Checklist Details Patient Name: [name]. Date of Service: 07/01/2018 10:30 AM Medical Record Number: [id number] Patient Account Number: [id number] Date of Birth/Sex: 16-Mar-1941 (77 y.o. F) Treating RN: Harold Barban Primary Care Leni Pankonin: Ramonita Lab Other Clinician: Jacqulyn Bath Referring Safi Culotta: Ramonita Lab Treating Caden Fukushima/Extender: Melburn Hake, HOYT Weeks in Treatment: 5 HBO Safety Checklist Items Safety Checklist Consent Form Signed Patient voided / foley secured and emptied When did you last eato 07/01/18 am Last dose of injectable or oral agent n/a Ostomy pouch emptied and vented if applicable NA All implantable devices assessed, documented and approved NA Intravenous access site secured and place NA Valuables secured Linens and cotton and cotton/polyester blend (less than 51% polyester) Personal oil-based products / skin lotions / body lotions removed Wigs or hairpieces removed NA Smoking or tobacco materials removed NA Books / newspapers / magazines / loose paper removed Cologne, aftershave, perfume and deodorant removed Jewelry removed (may wrap wedding band) Make-up removed NA Hair care products removed Battery operated devices (external) removed Heating patches and chemical warmers removed Titanium eyewear removed Nail polish cured greater than 10 hours NA Casting material cured greater than 10  hours NA Hearing aids removed Loose dentures or partials removed NA Prosthetics have been removed NA Patient demonstrates correct use of air break device (if applicable) Patient concerns have been addressed Patient grounding bracelet on and cord attached to chamber Specifics for Inpatients (complete in addition to above) Medication sheet sent with patient  Intravenous medications needed or due during therapy sent with patient Drainage tubes (e.g. nasogastric tube or chest tube secured and vented) Endotracheal or Tracheotomy tube secured Cuff deflated of air and inflated with saline Airway suctioned Electronic Signature(s) Signed: 07/01/2018 1:06:46 PM By: Lorine Bears RCP, RRT, CHT [place], [place] R. ([id number]) Entered By: Lorine Bears on 07/01/2018 10:34:54

## 2018-07-04 NOTE — Progress Notes (Signed)
SLYVIA, LARTIGUE ([id number]) Visit Report for 07/01/2018 Problem List Details Patient Name: [name], [name]. Date of Service: 07/01/2018 10:30 AM Medical Record Number: [id number] Patient Account Number: [id number] Date of Birth/Sex: 11/13/1940 (77 y.o. 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 Date of Service: 07/01/2018 Medical Record Number: [id number] Patient Account Number: [id number] Date of Birth/Sex: 16-Jun-1941 (77 y.o. 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 C50.812 Malignant neoplasm  of overlapping sites of left female breast Facility Procedures CPT4 Code: 32023343 Description: (Facility Use Only) HBOT, full body chamber, 53min Modifier: Quantity: 4 Physician Procedures CPT4: Description Modifier Quantity Code [id number] - WC PHYS HYPERBARIC OXYGEN THERAPY 1 ICD-10 Diagnosis Description 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

## 2018-07-04 NOTE — Progress Notes (Signed)
[name], [name] ([id number]) Visit Report for 07/02/2018 Arrival Information Details Patient Name: [name], [name]. Date of Service: 07/02/2018 10:30 AM Medical Record Number: [id number] Patient Account Number: [id number] Date of Birth/Sex: Oct 28, 1940 (77 y.o. F) Treating RN: Montey Hora Primary Care Estrella Alcaraz: Ramonita Lab Other Clinician: Jacqulyn Bath Referring Judyann Casasola: Ramonita Lab Treating Yarielis Funaro/Extender: Melburn Hake, HOYT Weeks in Treatment: 5 Visit Information History Since Last Visit Added or deleted any medications: No Patient Arrived: Walker Any new allergies or adverse reactions: No Arrival Time: 10:10 Had a fall or experienced change in No Accompanied By: husband activities of daily living that may affect Transfer Assistance: None risk of falls: Patient Identification Verified: Yes Signs or symptoms of abuse/neglect since last visito No Secondary Verification Process Completed: Yes Hospitalized since last visit: No Implantable device outside of the clinic excluding No cellular tissue based products placed in the center since last visit: Pain Present Now: No Electronic Signature(s) Signed: 07/02/2018 4:44:00 PM By: Lorine Bears RCP, RRT, CHT Entered By: Lorine Bears on 07/02/2018 10:46:16 [name] ([id number]) -------------------------------------------------------------------------------- Encounter Discharge Information Details Patient Name: [name]. Date of Service: 07/02/2018 10:30 AM Medical Record Number: [id number] Patient Account Number: [id number] Date of Birth/Sex: 07/28/1941 (77 y.o. F) Treating RN: Montey Hora Primary Care Anastashia Westerfeld: Ramonita Lab Other Clinician: Jacqulyn Bath Referring Sebasthian Stailey: Ramonita Lab Treating Shloime Keilman/Extender: Melburn Hake, HOYT Weeks in Treatment: 5 Encounter Discharge Information Items Discharge Condition: Stable Ambulatory Status: Walker Discharge Destination:  Home Transportation: Private Auto Accompanied By: husband Schedule Follow-up Appointment: No Clinical Summary of Care: Notes Patient has an HBO treatment scheduled on 07/05/18 at 10:30 am. Electronic Signature(s) Signed: 07/02/2018 4:44:00 PM By: Lorine Bears RCP, RRT, CHT Entered By: Lorine Bears on 07/02/2018 12:43:43 [name] ([id number]) -------------------------------------------------------------------------------- Patient/Caregiver Education Details Patient Name: [name] Date of Service: 07/02/2018 10:30 AM Medical Record Number: [id number] Patient Account Number: [id number] Date of Birth/Gender: 31-Dec-1940 (77 y.o. F) Treating RN: Montey Hora Primary Care Physician: Ramonita Lab Other Clinician: Jacqulyn Bath Referring Physician: Ramonita Lab Treating Physician/Extender: Sharalyn Ink in Treatment: 5 Education Assessment Education Provided To: Patient Education Topics Provided Hyperbaric Oxygenation: Responses: State content correctly Electronic Signature(s) Signed: 07/02/2018 4:44:00 PM By: Lorine Bears RCP, RRT, CHT Entered By: Becky Sax, Amado Nash on 07/02/2018 12:41:50 [name] ([id number]) -------------------------------------------------------------------------------- Vitals Details Patient Name: [name] Date of Service: 07/02/2018 10:30 AM Medical Record Number: [id number] Patient Account Number: [id number] Date of Birth/Sex: 23-Jan-1941 (77 y.o. F) Treating RN: Montey Hora Primary Care Taylour Lietzke: Ramonita Lab Other Clinician: Jacqulyn Bath Referring Aarush Stukey: Ramonita Lab Treating Oliwia Berzins/Extender: Melburn Hake, HOYT Weeks in Treatment: 5 Vital Signs Time Taken: 10:15 Pulse (bpm): 72 Height (in): 64 Respiratory Rate (breaths/min): 16 Weight (lbs): 164 Blood Pressure (mmHg): 112/64 Body Mass Index (BMI): 28.1 Reference Range: 80 - 120 mg / dl Electronic  Signature(s) Signed: 07/02/2018 4:44:00 PM By: Lorine Bears RCP, RRT, CHT Entered By: Lorine Bears on 07/02/2018 10:46:45

## 2018-07-05 ENCOUNTER — Encounter: Payer: Medicare Other | Admitting: Physician Assistant

## 2018-07-05 DIAGNOSIS — H90A22 Sensorineural hearing loss, unilateral, left ear, with restricted hearing on the contralateral side: Secondary | ICD-10-CM | POA: Diagnosis not present

## 2018-07-05 NOTE — Progress Notes (Signed)
[name], [name] ([id number]) Visit Report for 07/02/2018 Problem List Details Patient Name: [name], [name]. Date of Service: 07/02/2018 10:30 AM Medical Record Number: [id number] Patient Account Number: [id number] Date of Birth/Sex: Dec 06, 1940 (77 y.o. 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 Date of Service: 07/02/2018 Medical Record Number: [id number] Patient Account Number: [id number] Date of Birth/Sex: 19-Aug-1940 (77 y.o. 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 C50.812 Malignant neoplasm  of overlapping sites of left female breast Facility Procedures CPT4 Code: 88502774 Description: (Facility Use Only) HBOT, full body chamber, 79min Modifier: Quantity: 4 Physician Procedures CPT4: Description Modifier Quantity Code 1287867 [id number] - WC PHYS HYPERBARIC OXYGEN THERAPY 1 ICD-10 Diagnosis Description 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

## 2018-07-05 NOTE — Progress Notes (Signed)
HUSNA, KRONE ([id number]) Visit Report for 07/02/2018 HBO Details Patient Name: [name], [name]. Date of Service: 07/02/2018 10:30 AM Medical Record Number: [id number] Patient Account Number: [id number] Date of Birth/Sex: 1940/11/15 (77 y.o. 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) HBO Treatment Details Treatment Number: 15 Patient Type: Outpatient Chamber Type: Monoplace Chamber Serial #: [id number] Treatment Protocol: 2.0 ATA with 90 minutes oxygen, with two 5 minute air breaks Treatment Details Compression Rate Down: 1.0 psi / minute De-Compression Rate Up: 2.0 psi / minute Compress Tx Pressure Air breaks and breathing periods Decompress Decompress Begins Reached (leave unused spaces blank) Begins Ends Chamber Pressure (ATA) 1 2 2 2 2 2  --2 1 Clock Time (24 hr) 10:19 10:30 11:01 11:06 11:37 11:42 - - 12:12 12:20 Treatment Length: 121 (minutes) Treatment Segments: 4 Capillary Blood Glucose Pre Capillary Blood Glucose (mg/dl): Post Capillary Blood Glucose (mg/dl): Vital Signs Capillary Blood Glucose Reference Range: 80 - 120 mg / dl HBO Diabetic Blood Glucose Intervention Range: <131 mg/dl or >249 mg/dl Time Vitals Blood Respiratory Capillary Blood Glucose Pulse Action Type: Pulse: Temperature: Taken: Pressure: Rate: Glucose (mg/dl): Meter #: Oximetry (%) Taken: Pre 10:15 112/64 72 16 Post 12:28 120/74 72 16 97.9 Treatment Response Treatment Toleration: Well Treatment Completion Treatment Completed without Adverse Event Status: Roshaunda Starkey Notes Patient continues to tolerate HBO therapy  without complication. HBO Attestation I certify that I supervised this HBO treatment in accordance with Medicare guidelines. A trained emergency response Yes team is readily available per hospital policies and procedures. QUINNLYN, HEARNS R. ([id number]) Continue HBOT as ordered. Yes Electronic Signature(s) Signed: 07/05/2018 12:50:09 AM By: Worthy Keeler PA-C Previous Signature: 07/02/2018 4:44:00 PM Version By: Lorine Bears RCP, RRT, CHT Entered By: Worthy Keeler on 07/02/2018 17:10:37 [name] ([id number]) -------------------------------------------------------------------------------- HBO Safety Checklist Details Patient Name: [name]. Date of Service: 07/02/2018 10:30 AM Medical Record Number: [id number] Patient Account Number: [id number] Date of Birth/Sex: [birth year] (77 y.o. F) Treating RN: Montey Hora Primary Care Jesten Cappuccio: Ramonita Lab Other Clinician: Jacqulyn Bath Referring Lynley Killilea: Ramonita Lab Treating Kohner Orlick/Extender: Melburn Hake, HOYT Weeks in Treatment: 5 HBO Safety Checklist Items Safety Checklist Consent Form Signed Patient voided / foley secured and emptied When did you last eato 07/02/18 am Last dose of injectable or oral agent n/a Ostomy pouch emptied and vented if applicable NA All implantable devices assessed, documented and approved NA Intravenous access site secured and place NA Valuables secured Linens and cotton and cotton/polyester blend (less than 51% polyester) Personal oil-based products / skin lotions / body lotions removed Wigs or hairpieces removed NA Smoking or tobacco materials removed NA Books / newspapers / magazines / loose paper removed Cologne, aftershave, perfume and deodorant removed Jewelry removed (may wrap wedding band) Make-up removed Hair care products removed Battery operated devices (external) removed Heating patches and chemical warmers removed Titanium eyewear removed Nail polish  cured greater than 10 hours NA Casting material cured greater than 10 hours NA Hearing aids removed Loose dentures or partials removed NA Prosthetics have been removed NA Patient demonstrates correct use of air break device (if applicable) Patient concerns have been addressed Patient grounding bracelet on and cord attached to chamber Specifics for Inpatients (complete  in addition to above) Medication sheet sent with patient Intravenous medications needed or due during therapy sent with patient Drainage tubes (e.g. nasogastric tube or chest tube secured and vented) Endotracheal or Tracheotomy tube secured Cuff deflated of air and inflated with saline Airway suctioned Electronic Signature(s) Signed: 07/02/2018 4:44:00 PM By: Lorine Bears RCP, RRT, CHT Entered By: Lorine Bears on 07/02/2018 11:02:01 [name] ([id number])

## 2018-07-06 ENCOUNTER — Encounter: Payer: Medicare Other | Admitting: Physician Assistant

## 2018-07-06 ENCOUNTER — Telehealth: Payer: Self-pay | Admitting: Internal Medicine

## 2018-07-06 DIAGNOSIS — H90A22 Sensorineural hearing loss, unilateral, left ear, with restricted hearing on the contralateral side: Secondary | ICD-10-CM | POA: Diagnosis not present

## 2018-07-06 NOTE — Telephone Encounter (Signed)
My chart message sent that-we will hold chemotherapy tomorrow.  However recommend continue follow-up tomorrow. ? Access port for labs.  Dr.B

## 2018-07-06 NOTE — Progress Notes (Signed)
GEORGIANNA, BAND ([id number]) Visit Report for 07/05/2018 Arrival Information Details Patient Name: [name], [name]. Date of Service: 07/05/2018 10:30 AM Medical Record Number: [id number] Patient Account Number: [id number] Date of Birth/Sex: 26-Dec-1940 (77 y.o. F) Treating RN: Harold Barban Primary Care Serafina Topham: Ramonita Lab Other Clinician: Jacqulyn Bath Referring Wylee Ogden: Ramonita Lab Treating Yovani Cogburn/Extender: Melburn Hake, HOYT Weeks in Treatment: 6 Visit Information History Since Last Visit Added or deleted any medications: No Patient Arrived: Walker Any new allergies or adverse reactions: No Arrival Time: 10:28 Had a fall or experienced change in No Accompanied By: husband activities of daily living that may affect Transfer Assistance: None risk of falls: Patient Identification Verified: Yes Signs or symptoms of abuse/neglect since last visito No Secondary Verification Process Completed: Yes Hospitalized since last visit: No Implantable device outside of the clinic excluding No cellular tissue based products placed in the center since last visit: Pain Present Now: No Electronic Signature(s) Signed: 07/05/2018 1:50:03 PM By: Lorine Bears RCP, RRT, CHT Entered By: Lorine Bears on 07/05/2018 12:51:01 [name] ([id number]) -------------------------------------------------------------------------------- Encounter Discharge Information Details Patient Name: [name]. Date of Service: 07/05/2018 10:30 AM Medical Record Number: [id number] Patient Account Number: [id number] Date of Birth/Sex: 14-Jan-1941 (77 y.o. F) Treating RN: Harold Barban Primary Care Arlicia Paquette: Ramonita Lab Other Clinician: Jacqulyn Bath Referring Kenslie Abbruzzese: Ramonita Lab Treating Tavarion Babington/Extender: Melburn Hake, HOYT Weeks in Treatment: 6 Encounter Discharge Information Items Discharge Condition: Stable Ambulatory Status: Walker Discharge Destination:  Home Transportation: Private Auto Accompanied By: husband Schedule Follow-up Appointment: No Clinical Summary of Care: Notes Patient has an HBO treatment scheduled on 07/06/18 at 10:30 am. Electronic Signature(s) Signed: 07/05/2018 1:50:03 PM By: Lorine Bears RCP, RRT, CHT Entered By: Lorine Bears on 07/05/2018 12:50:45 [name] ([id number]) -------------------------------------------------------------------------------- Patient/Caregiver Education Details Patient Name: [name] Date of Service: 07/05/2018 10:30 AM Medical Record Number: [id number] Patient Account Number: [id number] Date of Birth/Gender: 21-May-1941 (77 y.o. F) Treating RN: Harold Barban Primary Care Physician: Ramonita Lab Other Clinician: Jacqulyn Bath Referring Physician: Ramonita Lab Treating Physician/Extender: Sharalyn Ink in Treatment: 6 Education Assessment Education Provided To: Patient Education Topics Provided Hyperbaric Oxygenation: Responses: State content correctly Electronic Signature(s) Signed: 07/05/2018 1:50:03 PM By: Lorine Bears RCP, RRT, CHT Entered By: Becky Sax, Amado Nash on 07/05/2018 12:49:57 [name] ([id number]) -------------------------------------------------------------------------------- Vitals Details Patient Name: [name] Date of Service: 07/05/2018 10:30 AM Medical Record Number: [id number] Patient Account Number: [id number] Date of Birth/Sex: 1940-12-29 (77 y.o. F) Treating RN: Harold Barban Primary Care Sandie Swayze: Ramonita Lab Other Clinician: Jacqulyn Bath Referring Shadai Mcclane: Ramonita Lab Treating Karisa Nesser/Extender: Melburn Hake, HOYT Weeks in Treatment: 6 Vital Signs Time Taken: 09:20 Pulse (bpm): 90 Height (in): 64 Respiratory Rate (breaths/min): 16 Weight (lbs): 164 Blood Pressure (mmHg): 102/62 Body Mass Index (BMI): 28.1 Reference Range: 80 - 120 mg /  dl Electronic Signature(s) Signed: 07/05/2018 1:50:03 PM By: Lorine Bears RCP, RRT, CHT Entered By: Becky Sax, Amado Nash on 07/05/2018 22:44:97

## 2018-07-07 ENCOUNTER — Inpatient Hospital Stay (HOSPITAL_BASED_OUTPATIENT_CLINIC_OR_DEPARTMENT_OTHER): Payer: Medicare Other | Admitting: Internal Medicine

## 2018-07-07 ENCOUNTER — Inpatient Hospital Stay: Payer: Medicare Other

## 2018-07-07 ENCOUNTER — Encounter: Payer: Medicare Other | Admitting: Internal Medicine

## 2018-07-07 ENCOUNTER — Other Ambulatory Visit: Payer: Self-pay

## 2018-07-07 ENCOUNTER — Inpatient Hospital Stay: Payer: Medicare Other | Attending: Internal Medicine

## 2018-07-07 VITALS — BP 123/75 | HR 80 | Temp 98.0°F | Resp 18 | Ht 63.0 in | Wt 165.0 lb

## 2018-07-07 DIAGNOSIS — C50912 Malignant neoplasm of unspecified site of left female breast: Secondary | ICD-10-CM

## 2018-07-07 DIAGNOSIS — C773 Secondary and unspecified malignant neoplasm of axilla and upper limb lymph nodes: Secondary | ICD-10-CM

## 2018-07-07 DIAGNOSIS — Z79899 Other long term (current) drug therapy: Secondary | ICD-10-CM

## 2018-07-07 DIAGNOSIS — Z5181 Encounter for therapeutic drug level monitoring: Secondary | ICD-10-CM

## 2018-07-07 DIAGNOSIS — C50812 Malignant neoplasm of overlapping sites of left female breast: Secondary | ICD-10-CM | POA: Diagnosis not present

## 2018-07-07 DIAGNOSIS — H90A22 Sensorineural hearing loss, unilateral, left ear, with restricted hearing on the contralateral side: Secondary | ICD-10-CM | POA: Diagnosis not present

## 2018-07-07 DIAGNOSIS — Z171 Estrogen receptor negative status [ER-]: Secondary | ICD-10-CM

## 2018-07-07 DIAGNOSIS — Z452 Encounter for adjustment and management of vascular access device: Secondary | ICD-10-CM | POA: Diagnosis not present

## 2018-07-07 LAB — COMPREHENSIVE METABOLIC PANEL
ALBUMIN: 4 g/dL (ref 3.5–5.0)
ALK PHOS: 57 U/L (ref 38–126)
ALT: 18 U/L (ref 0–44)
AST: 26 U/L (ref 15–41)
Anion gap: 9 (ref 5–15)
BILIRUBIN TOTAL: 0.7 mg/dL (ref 0.3–1.2)
BUN: 18 mg/dL (ref 8–23)
CALCIUM: 8.7 mg/dL — AB (ref 8.9–10.3)
CHLORIDE: 103 mmol/L (ref 98–111)
CO2: 26 mmol/L (ref 22–32)
Creatinine, Ser: 0.88 mg/dL (ref 0.44–1.00)
GFR calc non Af Amer: >60 mL/min (ref >60)
Glucose, Bld: 137 mg/dL — ABNORMAL HIGH (ref 70–99)
POTASSIUM: 3.7 mmol/L (ref 3.5–5.1)
Sodium: 138 mmol/L (ref 135–145)
Total Protein: 6.6 g/dL (ref 6.5–8.1)

## 2018-07-07 LAB — CBC WITH DIFFERENTIAL/PLATELET
Abs Immature Granulocytes: 0.02 10*3/uL (ref 0.00–0.07)
BASOS ABS: 0 10*3/uL (ref 0.0–0.1)
BASOS PCT: 1 %
EOS ABS: 0.3 10*3/uL (ref 0.0–0.5)
EOS PCT: 5 %
HEMATOCRIT: 41.5 % (ref 36.0–46.0)
Hemoglobin: 12.9 g/dL (ref 12.0–15.0)
Immature Granulocytes: 0 %
Lymphocytes Relative: 17 %
Lymphs Abs: 0.9 10*3/uL (ref 0.7–4.0)
MCH: 29.1 pg (ref 26.0–34.0)
MCHC: 31.1 g/dL (ref 30.0–36.0)
MCV: 93.7 fL (ref 80.0–100.0)
MONO ABS: 0.4 10*3/uL (ref 0.1–1.0)
Monocytes Relative: 7 %
NEUTROS ABS: 3.7 10*3/uL (ref 1.7–7.7)
NEUTROS PCT: 70 %
Platelets: 308 10*3/uL (ref 150–400)
RBC: 4.43 MIL/uL (ref 3.87–5.11)
RDW: 13.6 % (ref 11.5–15.5)
WBC: 5.3 10*3/uL (ref 4.0–10.5)
nRBC: 0 % (ref 0.0–0.2)

## 2018-07-07 MED ORDER — HEPARIN SOD (PORK) LOCK FLUSH 100 UNIT/ML IV SOLN
500.0000 [IU] | Freq: Once | INTRAVENOUS | Status: AC
  Administered 2018-07-07: 500 [IU] via INTRAVENOUS
  Filled 2018-07-07: qty 5

## 2018-07-07 MED ORDER — SODIUM CHLORIDE 0.9% FLUSH
10.0000 mL | INTRAVENOUS | Status: DC | PRN
  Administered 2018-07-07: 10 mL via INTRAVENOUS
  Filled 2018-07-07: qty 10

## 2018-07-07 NOTE — Assessment & Plan Note (Addendum)
#  Metastatic breast cancer ER [name] HER-2/neu positive-SEP 16th 2019 CT scan NED; stable.  On Herceptin/perjeta maintenance.  #Hold Herceptin Perjeta today-MUGA scan December 2019-shows drop of ejection fraction by 16 points.  Will repeat MUGA scan in 6 weeks-and reevaluate for treatment.  #Drop in ejection fraction-baseline 68%; most recent 53.  Asymptomatic.  See plan above.  # ? Fatigue- Sec to above vs others-  #Chronic dementia/debility-stable.  # left ear hearing loss-? Neuro-vascular HBO- evaluation s/p HBO;-improved.   # chronic hip/back  pain-stable ontinue tramadol prn;   DISPOSITION:  # Hold treatment today; # follow up in 6 weeks-MD labs/ cbc/cmp-Herceptin-Perjeta; MUGA scan prior- dr.B

## 2018-07-07 NOTE — Progress Notes (Signed)
[name], [name] ([id number]) Visit Report for 07/05/2018 Problem List Details Patient Name: [name], [name]. Date of Service: 07/05/2018 10:30 AM Medical Record Number: [id number] Patient Account Number: [id number] Date of Birth/Sex: 1941/02/11 (77 y.o. 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 Date of Service: 07/05/2018 Medical Record Number: [id number] Patient Account Number: [id number] Date of Birth/Sex: 11-13-1940 (77 y.o. 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 C50.812 Malignant neoplasm  of overlapping sites of left female breast Facility Procedures CPT4 Code: 74163845 Description: (Facility Use Only) HBOT, full body chamber, 30min Modifier: Quantity: 4 Physician Procedures CPT4: Description Modifier Quantity Code 3646803 21224 - WC PHYS HYPERBARIC OXYGEN THERAPY 1 ICD-10 Diagnosis Description 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

## 2018-07-07 NOTE — Progress Notes (Signed)
[name], [name] ([id number]) Visit Report for 07/05/2018 HBO Details Patient Name: [name], [name]. Date of Service: 07/05/2018 10:30 AM Medical Record Number: [id number] Patient Account Number: [id number] Date of Birth/Sex: Dec 17, 1940 (77 y.o. 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) HBO Treatment Details Treatment Number: 16 Patient Type: Outpatient Chamber Type: Monoplace Chamber Serial #: [id number] Treatment Protocol: 2.0 ATA with 90 minutes oxygen, with two 5 minute air breaks Treatment Details Compression Rate Down: 1.0 psi / minute De-Compression Rate Up: 2.0 psi / minute Compress Tx Pressure Air breaks and breathing periods Decompress Decompress Begins Reached (leave unused spaces blank) Begins Ends Chamber Pressure (ATA) 1 2 2 2 2 2  --2 1 Clock Time (24 hr) 10:26 10:43 11:13 11:18 11:50 11:55 - - 12:25 12:33 Treatment Length: 127 (minutes) Treatment Segments: 4 Capillary Blood Glucose Pre Capillary Blood Glucose (mg/dl): Post Capillary Blood Glucose (mg/dl): Vital Signs Capillary Blood Glucose Reference Range: 80 - 120 mg / dl HBO Diabetic Blood Glucose Intervention Range: <131 mg/dl or >249 mg/dl Time Vitals Blood Respiratory Capillary Blood Glucose Pulse Action Type: Pulse: Temperature: Taken: Pressure: Rate: Glucose (mg/dl): Meter #: Oximetry (%) Taken: Pre 09:20 102/62 90 16 Post 12:38 108/66 78 16 97.8 Treatment Response Treatment Toleration: Well Treatment Completion Treatment Completed without Adverse Event Status: HBO Attestation I certify that I supervised this HBO  treatment in accordance with Medicare guidelines. A trained emergency response Yes team is readily available per hospital policies and procedures. Continue HBOT as ordered. Yes [name], [name] ([id number]) Electronic Signature(s) Signed: 07/06/2018 7:20:38 AM By: Worthy Keeler PA-C Previous Signature: 07/05/2018 1:50:03 PM Version By: Lorine Bears RCP, RRT, CHT Entered By: Worthy Keeler on 07/06/2018 07:18:47 [name] ([id number]) -------------------------------------------------------------------------------- HBO Safety Checklist Details Patient Name: [name]. Date of Service: 07/05/2018 10:30 AM Medical Record Number: [id number] Patient Account Number: [id number] Date of Birth/Sex: [birth year] (77 y.o. F) Treating RN: Harold Barban Primary Care Nalee Lightle: Ramonita Lab Other Clinician: Jacqulyn Bath Referring Darlen Gledhill: Ramonita Lab Treating Masaki Rothbauer/Extender: Melburn Hake, HOYT Weeks in Treatment: 6 HBO Safety Checklist Items Safety Checklist Consent Form Signed Patient voided / foley secured and emptied When did you last eato 07/05/18 am Last dose of injectable or oral agent n/a Ostomy pouch emptied and vented if applicable NA All implantable devices assessed, documented and approved NA Intravenous access site secured and place NA Valuables secured Linens and cotton and cotton/polyester blend (less than 51% polyester) Personal oil-based products / skin lotions / body lotions removed Wigs or hairpieces removed NA Smoking or tobacco materials removed NA Books / newspapers / magazines / loose paper removed Cologne, aftershave, perfume and deodorant removed Jewelry removed (may wrap wedding band) Make-up removed Hair care products removed Battery operated devices (external) removed Heating patches and chemical warmers removed Titanium eyewear removed Nail polish cured greater than 10 hours NA Casting material cured greater than 10  hours NA Hearing aids removed Loose dentures or partials removed NA Prosthetics have been removed NA Patient demonstrates correct use of air break device (if applicable) Patient concerns have been addressed Patient grounding bracelet on and cord attached to chamber Specifics for Inpatients (complete in addition to above) Medication sheet sent with patient Intravenous  medications needed or due during therapy sent with patient Drainage tubes (e.g. nasogastric tube or chest tube secured and vented) Endotracheal or Tracheotomy tube secured Cuff deflated of air and inflated with saline Airway suctioned Electronic Signature(s) Signed: 07/05/2018 1:50:03 PM By: Lorine Bears RCP, RRT, CHT [name], Michele Point R. ([id number]) Entered By: Lorine Bears on 07/05/2018 10:30:25

## 2018-07-07 NOTE — Progress Notes (Signed)
[facility] OFFICE PROGRESS NOTE  Patient Care Team: [name], [name], MD (General Surgery) Requested, [name]   Oncology History   # June 2016- LEFT BREAST CA;  [name] ; [name] ; [name]; Her 2 Neu POS; [place] from July OF 2016; [name]; adjuvant Herceptin [ Finished July 2017]; AUG 2017- Neratinib x5 days; DISCON sec to diarrhea  # MID OCT 2018- Right breast mass-Bx- ER/PR-NEG; Her 2 NEU POSITIVE 1~2.5cm;  [?NEW primary]  # MID-OCT 2018-METASTATIC RECURRENT-oh sternal mass; Left Ax LN [Bx]/periportal LN  # OCT 25th 2018- TAXOL-HERCEPTIN-PERJETA; Jan 2019- CT PR; continue HP only.    -------------------------------------------------------------------------------   # MUGA scan- July 28th 2017- 67%.  # chronic gait/balance issues  # June 2017- left breast Bx- fat necrosis [[name]  # july 2017-  BRCA 1& 2- NEG.   MOLECULAR TESTING- F ONE- TPS- 0%; pending; ERB2 amplification; PI3K/RET amplification Others**  --------------------------------------------------    DIAGNOSIS: [ OCT 2018]- REC/MET- BREAST CA ER/PR-NEG; her 2 POS  STAGE: 4       ;GOALS: Palliative  CURRENT/MOST RECENT THERAPY- Herceptin-Perjeta      Carcinoma of overlapping sites of left breast in female, estrogen receptor negative ([place])      INTERVAL HISTORY:  [name] 77 y.o.  female pleasant patient above history of metastatic breast cancer on Herceptin plus Perjeta is here for follow-up.  Patient is currently getting hyperbaric oxygen for left hearing loss.  Noticed to have improvement on the audiometric evaluation.  Chronic left hip pain.  Chronic gait instability.  Not any worse.  No falls.  Denies any shortness of breath or cough.  No swelling in the legs.  Review of Systems   Constitutional: Negative for chills, diaphoresis, fever, malaise/fatigue and weight loss.  HENT: Positive for hearing loss (Left-sided.). Negative for nosebleeds and sore throat.   Eyes: Negative for double vision.  Respiratory: Negative for cough, hemoptysis, sputum production, shortness of breath and wheezing.   Cardiovascular: Negative for chest pain, palpitations, orthopnea and leg swelling.  Gastrointestinal: Negative for abdominal pain, blood in stool, constipation, diarrhea, heartburn, melena, nausea and vomiting.  Genitourinary: Negative for dysuria, frequency and urgency.  Musculoskeletal: Positive for back pain and joint pain.  Skin: Negative.  Negative for itching and rash.  Neurological: Negative for dizziness, tingling, focal weakness, weakness and headaches.  Endo/Heme/Allergies: Does not bruise/bleed easily.  Psychiatric/Behavioral: Positive for memory loss. Negative for depression. The patient is not nervous/anxious and does not have insomnia.       PAST MEDICAL HISTORY :  Past Medical History:  Diagnosis Date  . Arthritis   . Brain tumor ([place]) 1995   meningeoma  . Breast cancer ([place]) 2016   left breast; surgery 01/11/15 with Dr. Bary Castilla; path with invasive mammary and DCIS  . Breast cancer of upper-inner quadrant of left female breast ([place]) 12/15/14   Completed radiation end of December and finished chemotherapy 2 weeks ago, Left breast invasive mammary carcinoma, T1cN38mc (1.5 cm); Grade 3, [name], [name], [name], .  .Marland KitchenCataract    bilat   . DDD (degenerative disc disease), lumbar    Lumbar, previously evaluated by Dr. [place]arnestine Leys . [place]   . Fibrocystic breast disease    prior biopsy  . Glaucoma   .  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 Hearing loss   . History of cancer chemotherapy   . History of radiation therapy   . Hyperlipidemia, unspecified   . Imbalance   . Memory impairment    seen by Dr  Manuella Ghazi  possible post crainiotomy from radiation  . 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  . Numbness and tingling    right hand   . Osteoarthritis   . Osteoporosis, post-menopausal   . Personal history of chemotherapy   . Personal history of radiation therapy   . Shoulder pain, right   . Wears glasses     PAST SURGICAL HISTORY :   Past Surgical History:  Procedure Laterality Date  . APPENDECTOMY  1950  . BRAIN SURGERY  1995   left frontal/temporal  . BREAST BIOPSY Left 1997  . BREAST BIOPSY Left 12/15/14   confirmed DCIS  . BREAST BIOPSY Left 01/08/2015   Procedure: BREAST BIOPSY WITH NEEDLE LOCALIZATION;  Surgeon: [name], MD;  Location: [facility] ORS;  Service: General;  Laterality: Left;  . BREAST LUMPECTOMY Left 01/08/2015   Procedure: LUMPECTOMY;  Surgeon: [name], MD;  Location: [facility] ORS;  Service: General;  Laterality: Left;  . COLONOSCOPY  2010   Dr. [name]  . ORIF ANKLE FRACTURE Right 08/05/2017   Procedure: OPEN REDUCTION INTERNAL FIXATION (ORIF) ANKLE FRACTURE;  Surgeon: Earnestine Leys, MD;  Location: [facility] ORS;  Service: Orthopedics;  Laterality: Right;  . PORTACATH PLACEMENT Right 01/16/2015   Procedure: INSERTION PORT-A-CATH;  Surgeon: [name], MD;  Location: [facility] ORS;  Service: General;  Laterality: Right;  . SENTINEL NODE BIOPSY Left 01/16/2015   Procedure: SENTINEL NODE BIOPSY;  Surgeon: [name], MD;  Location: [facility] OR[facility];  Service: General;  Laterality: Left;  . TOTAL HIP ARTHROPLASTY Right 09/04/2015   Procedure: RIGHT TOTAL HIP ARTHROPLASTY ANTERIOR APPROACH;  Surgeon: [name], MD;  Location: [facility];  Service: Orthopedics;  Laterality: Right;    FAMILY HISTORY :   Family History  Problem Relation Age of Onset  . Breast cancer Sister 74  . Addison's disease Mother   . Hyperthyroidism Mother   . Osteoarthritis Mother   . Stroke Father   . Heart disease  Father   . High blood pressure Father     SOCIAL HISTORY:   Social History   Tobacco Use  . Smoking status: Former Smoker    Packs/day: 0.25    Years: 5.00    Pack years: 1.25    Types: Cigarettes    Last attempt to quit: 07/28/1972    Years since quitting: 45.9  . Smokeless tobacco: Never Used  Substance Use Topics  . Alcohol use: Yes    Alcohol/week: 1.0 - 2.0 standard drinks    Types: 1 - 2 Glasses of wine per week    Comment: 1 Glass Wine / Night  . Drug use: No    ALLERGIES:  is allergic to donepezil.  MEDICATIONS:  Current Outpatient Medications  Medication Sig Dispense Refill  . Calcium Carbonate-Vit D-Min (CALTRATE 600+D PLUS MINERALS) 600-800 MG-UNIT TABS Take 1 tablet by mouth every morning.     . celecoxib (CELEBREX) 200 MG capsule Take 200 mg by mouth 2 (two) times daily.    . Cetirizine HCl (ZYRTEC ALLERGY PO) Take 1 tablet by mouth as needed (allergies).    . Cholecalciferol (VITAMIN D-3) 5000 units  TABS Take 1 tablet by mouth daily.    Marland Kitchen levETIRAcetam (KEPPRA) 250 MG tablet 1 tablet daily at bedtime  5  . naproxen sodium (ALEVE) 220 MG tablet Take 220 mg by mouth daily as needed (pain).    . traMADol (ULTRAM) 50 MG tablet Take 1 tablet (50 mg total) by mouth 2 (two) times daily as needed for moderate pain. (Patient taking differently: Take 50 mg by mouth once a week. ) 60 tablet 0  . vitamin C (ASCORBIC ACID) 250 MG tablet Take 250 mg by mouth daily.    . vitamin E 400 UNIT capsule Take 400 Units by mouth daily.     Marland Kitchen acetaminophen (TYLENOL) 325 MG tablet Take 650 mg by mouth every 4 (four) hours as needed. Pain / increased temp.    Marland Kitchen aspirin 325 MG EC tablet Take 325 mg by mouth 2 (two) times daily.     . bisacodyl (DULCOLAX) 10 MG suppository Place 1 suppository (10 mg total) rectally daily as needed for moderate constipation. (Patient not taking: Reported on 05/26/2018) 12 suppository 0  . Calcium Carbonate-Vitamin D (CALTRATE 600+D PO) Take 1 tablet by mouth  daily.    . prochlorperazine (COMPAZINE) 10 MG tablet Take 1 tablet (10 mg total) every 6 (six) hours as needed by mouth for nausea or vomiting. (Patient not taking: Reported on 07/07/2018) 40 tablet 1   No current facility-administered medications for this visit.    Facility-Administered Medications Ordered in Other Visits  Medication Dose Route Frequency Provider Last Rate Last Dose  . sodium chloride flush (NS) 0.9 % injection 10 mL  10 mL Intravenous PRN Cammie Sickle, MD   10 mL at 07/07/18 0830    PHYSICAL EXAMINATION: ECOG PERFORMANCE STATUS: 1 - Symptomatic but completely ambulatory  BP 123/75   Pulse 80   Temp 98 F (36.7 C) (Oral)   Resp 18   Ht 5' 3"  (1.6 m)   Wt 165 lb (74.8 kg)   BMI 29.23 kg/m   Filed Weights   07/07/18 0849  Weight: 165 lb (74.8 kg)    Physical Exam  Constitutional: She is oriented to person, place, and time and well-developed, well-nourished, and in no distress.  In wheel chair/ sec to gait instability; with husband.   HENT:  Head: Normocephalic and atraumatic.  Mouth/Throat: Oropharynx is clear and moist. No oropharyngeal exudate.  Eyes: Pupils are equal, round, and reactive to light.  Neck: Normal range of motion. Neck supple.  Cardiovascular: Normal rate and regular rhythm.  Pulmonary/Chest: No respiratory distress. She has no wheezes.  Abdominal: Soft. Bowel sounds are normal. She exhibits no distension and no mass. There is no tenderness. There is no rebound and no guarding.  Musculoskeletal: Normal range of motion. She exhibits no edema or tenderness.  Neurological: She is alert and oriented to person, place, and time.  Skin: Skin is warm.  Psychiatric: Affect normal.       LABORATORY DATA:  I have reviewed the data as listed    Component Value Date/Time   NA 138 07/07/2018 0825   K 3.7 07/07/2018 0825   CL 103 07/07/2018 0825   CO2 26 07/07/2018 0825   GLUCOSE 137 (H) 07/07/2018 0825   BUN 18 07/07/2018 0825    CREATININE 0.88 07/07/2018 0825   CALCIUM 8.7 (L) 07/07/2018 0825   PROT 6.6 07/07/2018 0825   ALBUMIN 4.0 07/07/2018 0825   AST 26 07/07/2018 0825   ALT 18 07/07/2018 0825   ALKPHOS  57 07/07/2018 0825   BILITOT 0.7 07/07/2018 0825   GFRNONAA >60 07/07/2018 0825   GFRAA >60 07/07/2018 0825    No results found for: SPEP, UPEP  Lab Results  Component Value Date   WBC 5.3 07/07/2018   NEUTROABS 3.7 07/07/2018   HGB 12.9 07/07/2018   HCT 41.5 07/07/2018   MCV 93.7 07/07/2018   PLT 308 07/07/2018      Chemistry      Component Value Date/Time   NA 138 07/07/2018 0825   K 3.7 07/07/2018 0825   CL 103 07/07/2018 0825   CO2 26 07/07/2018 0825   BUN 18 07/07/2018 0825   CREATININE 0.88 07/07/2018 0825      Component Value Date/Time   CALCIUM 8.7 (L) 07/07/2018 0825   ALKPHOS 57 07/07/2018 0825   AST 26 07/07/2018 0825   ALT 18 07/07/2018 0825   BILITOT 0.7 07/07/2018 0825       RADIOGRAPHIC STUDIES: I have personally reviewed the radiological images as listed and agreed with the findings in the report. No results found.   ASSESSMENT & PLAN:  Carcinoma of overlapping sites of left breast in female, estrogen receptor negative ([place]) #Metastatic breast cancer ER [name] HER-2/neu positive-SEP 16th 2019 CT scan NED; stable.  On Herceptin/perjeta maintenance.  #Hold Herceptin Perjeta today-MUGA scan December 2019-shows drop of ejection fraction by 16 points.  Will repeat MUGA scan in 6 weeks-and reevaluate for treatment.  #Drop in ejection fraction-baseline 68%; most recent 53.  Asymptomatic.  See plan above.  # ? Fatigue- Sec to above vs others-  #Chronic dementia/debility-stable.  # left ear hearing loss-? Neuro-vascular HBO- evaluation s/p HBO;-improved.   # chronic hip/back  pain-stable ontinue tramadol prn;   DISPOSITION:  # Hold treatment today; # follow up in 6 weeks-MD labs/ cbc/cmp-Herceptin-Perjeta; MUGA scan prior- dr.B   Orders Placed This  Encounter  Procedures  . NM Cardiac [facility] Rest    Standing Status:   Future    Standing Expiration Date:   07/08/2019    Order Specific Question:   Will [facility] be the location of this test?    Answer:   Yes    Order Specific Question:   caspofungin (CANCIDAS) frequency    Answer:   Q 24H    Order Specific Question:   Preferred imaging location?    Answer:   [facility]    Order Specific Question:   Radiology Contrast Protocol - do NOT remove file path    Answer:   [email or web address]    Order Specific Question:   ** REASON FOR EXAM (FREE TEXT)    Answer:   on cardiotoxic chemo  . CBC with Differential    Standing Status:   Future    Standing Expiration Date:   07/08/2019  . Comprehensive metabolic panel    Standing Status:   Future    Standing Expiration Date:   07/08/2019   All questions were answered. The patient knows to call the clinic with any problems, questions or concerns.      Cammie Sickle, MD 07/07/2018 10:10 AM

## 2018-07-08 ENCOUNTER — Encounter: Payer: Medicare Other | Admitting: Physician Assistant

## 2018-07-08 DIAGNOSIS — H90A22 Sensorineural hearing loss, unilateral, left ear, with restricted hearing on the contralateral side: Secondary | ICD-10-CM | POA: Diagnosis not present

## 2018-07-08 NOTE — Progress Notes (Signed)
[name] ([id number]) Visit Report for 07/07/2018 Arrival Information Details Patient Name: [name], [name]. Date of Service: 07/07/2018 1:00 PM Medical Record Number: [id number] Patient Account Number: [id number] Date of Birth/Sex: 15-Feb-1941 (77 y.o. F) Treating RN: Cornell Barman Primary Care Saksham Akkerman: Ramonita Lab Other Clinician: Jacqulyn Bath Referring Zina Pitzer: Ramonita Lab Treating Tasman Zapata/Extender: Tito Dine in Treatment: 6 Visit Information History Since Last Visit Added or deleted any medications: No Patient Arrived: Gilford Rile Any new allergies or adverse reactions: No Arrival Time: 13:05 Had a fall or experienced change in No Accompanied By: husband activities of daily living that may affect Transfer Assistance: Manual risk of falls: Patient Identification Verified: Yes Signs or symptoms of abuse/neglect since last visito No Secondary Verification Process Completed: Yes Hospitalized since last visit: No Implantable device outside of the clinic excluding No cellular tissue based products placed in the center since last visit: Pain Present Now: No Electronic Signature(s) Signed: 07/07/2018 3:48:01 PM By: Lorine Bears RCP, RRT, CHT Entered By: Lorine Bears on 07/07/2018 13:27:16 [name] ([id number]) -------------------------------------------------------------------------------- Encounter Discharge Information Details Patient Name: [name]. Date of Service: 07/07/2018 1:00 PM Medical Record Number: [id number] Patient Account Number: [id number] Date of Birth/Sex: 06-Mar-1941 (77 y.o. F) Treating RN: Cornell Barman Primary Care Everett Ehrler: Ramonita Lab Other Clinician: Jacqulyn Bath Referring Jeovani Weisenburger: Ramonita Lab Treating Glenn Christo/Extender: Tito Dine in Treatment: 6 Encounter Discharge Information Items Discharge Condition: Stable Ambulatory Status: Walker Discharge Destination:  Home Transportation: Private Auto Accompanied By: husband Schedule Follow-up Appointment: No Clinical Summary of Care: Notes Patient has an HBO treatment scheduled on 07/08/18 at 10:30 am. Electronic Signature(s) Signed: 07/07/2018 3:48:01 PM By: Lorine Bears RCP, RRT, CHT Entered By: Lorine Bears on 07/07/2018 15:47:41 [name] ([id number]) -------------------------------------------------------------------------------- Patient/Caregiver Education Details Patient Name: [name]. Date of Service: 07/07/2018 1:00 PM Medical Record Number: [id number] Patient Account Number: [id number] Date of Birth/Gender: 02/08/1941 (77 y.o. F) Treating RN: Cornell Barman Primary Care Physician: Ramonita Lab Other Clinician: Jacqulyn Bath Referring Physician: Ramonita Lab Treating Physician/Extender: Tito Dine in Treatment: 6 Education Assessment Education Provided To: Patient Education Topics Provided Hyperbaric Oxygenation: Responses: State content correctly Electronic Signature(s) Signed: 07/07/2018 3:48:01 PM By: Lorine Bears RCP, RRT, CHT Entered By: Becky Sax, Amado Nash on 07/07/2018 15:47:02 [name] ([id number]) -------------------------------------------------------------------------------- Vitals Details Patient Name: [name] Date of Service: 07/07/2018 1:00 PM Medical Record Number: [id number] Patient Account Number: [id number] Date of Birth/Sex: 25-Sep-1940 (77 y.o. F) Treating RN: Cornell Barman Primary Care Abria Vannostrand: Ramonita Lab Other Clinician: Jacqulyn Bath Referring Alohilani Levenhagen: Ramonita Lab Treating Noami Bove/Extender: Tito Dine in Treatment: 6 Vital Signs Time Taken: 13:05 Temperature (F): 98.0 Height (in): 64 Pulse (bpm): 66 Weight (lbs): 164 Respiratory Rate (breaths/min): 16 Body Mass Index (BMI): 28.1 Blood Pressure (mmHg): 106/62 Reference Range: 80 -  120 mg / dl Electronic Signature(s) Signed: 07/07/2018 3:48:01 PM By: Lorine Bears RCP, RRT, CHT Entered By: Becky Sax, Amado Nash on 07/07/2018 13:28:03

## 2018-07-08 NOTE — Progress Notes (Signed)
[name] ([id number]) Visit Report for 07/06/2018 Arrival Information Details Patient Name: [name], [name]. Date of Service: 07/06/2018 10:30 AM Medical Record Number: [id number] Patient Account Number: [id number] Date of Birth/Sex: [birth year] (77 y.o. F) Treating RN: Montey Hora Primary Care Mylasia Vorhees: Ramonita Lab Other Clinician: Jacqulyn Bath Referring Ronrico Dupin: Ramonita Lab Treating Keveon Amsler/Extender: Melburn Hake, HOYT Weeks in Treatment: 6 Visit Information History Since Last Visit Added or deleted any medications: No Patient Arrived: Walker Any new allergies or adverse reactions: No Arrival Time: 10:22 Had a fall or experienced change in No Accompanied By: husband activities of daily living that may affect Transfer Assistance: None risk of falls: Patient Identification Verified: Yes Signs or symptoms of abuse/neglect since last visito No Secondary Verification Process Completed: Yes Hospitalized since last visit: No Implantable device outside of the clinic excluding No cellular tissue based products placed in the center since last visit: Pain Present Now: No Electronic Signature(s) Signed: 07/06/2018 1:28:07 PM By: Lorine Bears RCP, RRT, CHT Entered By: Lorine Bears on 07/06/2018 10:23:50 [name] ([id number]) -------------------------------------------------------------------------------- Encounter Discharge Information Details Patient Name: [name]. Date of Service: 07/06/2018 10:30 AM Medical Record Number: [id number] Patient Account Number: [id number] Date of Birth/Sex: [birth year] (77 y.o. F) Treating RN: Montey Hora Primary Care Imad Shostak: Ramonita Lab Other Clinician: Jacqulyn Bath Referring Haile Toppins: Ramonita Lab Treating Raneem Mendolia/Extender: Melburn Hake, HOYT Weeks in Treatment: 6 Encounter Discharge Information Items Discharge Condition: Stable Ambulatory Status: Walker Discharge Destination:  Home Transportation: Private Auto Accompanied By: husband Schedule Follow-up Appointment: No Clinical Summary of Care: Notes Patient has an HBO treatment scheduled on 07/07/18 at 13:00 pm. Electronic Signature(s) Signed: 07/06/2018 1:28:07 PM By: Lorine Bears RCP, RRT, CHT Entered By: Lorine Bears on 07/06/2018 12:42:43 [name] ([id number]) -------------------------------------------------------------------------------- Patient/Caregiver Education Details Patient Name: [name] Date of Service: 07/06/2018 10:30 AM Medical Record Number: [id number] Patient Account Number: [id number] Date of Birth/Gender: August 25, 1940 (77 y.o. F) Treating RN: Montey Hora Primary Care Physician: Ramonita Lab Other Clinician: Jacqulyn Bath Referring Physician: Ramonita Lab Treating Physician/Extender: Sharalyn Ink in Treatment: 6 Education Assessment Education Provided To: Patient Education Topics Provided Hyperbaric Oxygenation: Responses: State content correctly Electronic Signature(s) Signed: 07/06/2018 1:28:07 PM By: Lorine Bears RCP, RRT, CHT Entered By: Becky Sax, Amado Nash on 07/06/2018 12:41:51 [name] ([id number]) -------------------------------------------------------------------------------- Vitals Details Patient Name: [name] Date of Service: 07/06/2018 10:30 AM Medical Record Number: [id number] Patient Account Number: [id number] Date of Birth/Sex: [birth year] (77 y.o. F) Treating RN: Montey Hora Primary Care Dejana Pugsley: Ramonita Lab Other Clinician: Jacqulyn Bath Referring Rashada Klontz: Ramonita Lab Treating Detrice Cales/Extender: Melburn Hake, HOYT Weeks in Treatment: 6 Vital Signs Time Taken: 10:15 Pulse (bpm): 72 Height (in): 64 Respiratory Rate (breaths/min): 16 Weight (lbs): 164 Blood Pressure (mmHg): 128/64 Body Mass Index (BMI): 28.1 Reference Range: 80 - 120 mg /  dl Electronic Signature(s) Signed: 07/06/2018 1:28:07 PM By: Lorine Bears RCP, RRT, CHT Entered By: Lorine Bears on 07/06/2018 10:24:18

## 2018-07-09 ENCOUNTER — Encounter: Payer: Medicare Other | Admitting: Physician Assistant

## 2018-07-09 DIAGNOSIS — H90A22 Sensorineural hearing loss, unilateral, left ear, with restricted hearing on the contralateral side: Secondary | ICD-10-CM | POA: Diagnosis not present

## 2018-07-09 NOTE — Progress Notes (Signed)
BONETA, STANDRE ([id number]) Visit Report for 07/07/2018 HBO Details Patient Name: [name], [name]. Date of Service: 07/07/2018 1:00 PM Medical Record Number: [id number] Patient Account Number: [id number] Date of Birth/Sex: 13-Apr-1941 (77 y.o. 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) HBO Treatment Details Treatment Number: 18 Patient Type: Outpatient Chamber Type: Monoplace Chamber Serial #: [id number] Treatment Protocol: 2.0 ATA with 90 minutes oxygen, with two 5 minute air breaks Treatment Details Compression Rate Down: 1.5 psi / minute De-Compression Rate Up: 2.0 psi / minute Compress Tx Pressure Air breaks and breathing periods Decompress Decompress Begins Reached (leave unused spaces blank) Begins Ends Chamber Pressure (ATA) 1 2 2 2 2 2  --2 1 Clock Time (24 hr) 13:13 13:24 13:55 14:00 14:30 14:35 - - 15:06 15:14 Treatment Length: 121 (minutes) Treatment Segments: 4 Capillary Blood Glucose Pre Capillary Blood Glucose (mg/dl): Post Capillary Blood Glucose (mg/dl): Vital Signs Capillary Blood Glucose Reference Range: 80 - 120 mg / dl HBO Diabetic Blood Glucose Intervention Range: <131 mg/dl or >249 mg/dl Time Vitals Blood Respiratory Capillary Blood Glucose Pulse Action Type: Pulse: Temperature: Taken: Pressure: Rate: Glucose (mg/dl): Meter #: Oximetry (%) Taken: Pre 13:05 106/62 66 16 98 Post 15:20 118/60 72 16 98.4 Treatment Response Treatment Completion Status: Treatment Completed without Adverse Event Electronic Signature(s) Signed: 07/07/2018 3:48:01 PM By: Paulla Fore, RRT, CHT Signed: 07/07/2018 4:51:45 PM By: Linton Ham MD Entered By: Lorine Bears on 07/07/2018 15:46:45 [name] ([id number]) -------------------------------------------------------------------------------- HBO Safety Checklist Details Patient Name: [name]. Date of Service: 07/07/2018 1:00 PM Medical Record Number: [id number] Patient Account Number: [id number] Date of Birth/Sex: July 28, 1941 (77 y.o. F) Treating RN: Cornell Barman Primary Care Marnee Sherrard: Ramonita Lab Other Clinician: Jacqulyn Bath Referring Corbitt Cloke: Ramonita Lab Treating Clydell Sposito/Extender: Tito Dine in Treatment: 6 HBO Safety Checklist Items Safety Checklist Consent Form Signed Patient voided / foley secured and emptied When did you last eato 07/07/18 noon Last dose of injectable or oral agent n/a Ostomy pouch emptied and vented if applicable NA All implantable devices assessed, documented and approved NA Intravenous access site secured and place NA Valuables secured Linens and cotton and cotton/polyester blend (less than 51% polyester) Personal oil-based products / skin lotions / body lotions removed Wigs or hairpieces removed NA Smoking or tobacco materials removed NA Books / newspapers / magazines / loose paper removed Cologne, aftershave, perfume and deodorant removed Jewelry removed (may wrap wedding band) Make-up removed Hair care products removed Battery operated devices (external) removed Heating patches and chemical warmers removed Titanium eyewear removed Nail polish cured greater than 10 hours NA Casting material cured greater than 10 hours NA Hearing aids removed Loose dentures or partials removed NA Prosthetics have been removed NA Patient demonstrates correct use of air break device (if applicable) Patient concerns have been addressed Patient grounding bracelet on and cord attached to chamber Specifics for Inpatients (complete in  addition to above) Medication sheet sent with patient Intravenous medications needed or due during therapy sent with patient Drainage tubes (e.g. nasogastric tube or chest tube secured and vented) Endotracheal or Tracheotomy tube secured Cuff deflated of air and inflated with saline Airway suctioned Electronic Signature(s) Signed: 07/07/2018 3:48:01 PM By: Juleen China,  RCP,RRT,CHT, Sallie RCP, RRT, CHT CHADE, PITNER ([id number]) Entered By: Lorine Bears on 07/07/2018 13:30:20

## 2018-07-10 NOTE — Progress Notes (Signed)
MINNETTE, MERIDA ([id number]) Visit Report for 07/08/2018 Arrival Information Details Patient Name: [name], [name]. Date of Service: 07/08/2018 10:30 AM Medical Record Number: [id number] Patient Account Number: [id number] Date of Birth/Sex: 1940-09-16 (77 y.o. F) Treating RN: Harold Barban Primary Care Mckaela Howley: Ramonita Lab Other Clinician: Jacqulyn Bath Referring Candee Hoon: Ramonita Lab Treating Ubaldo Daywalt/Extender: Melburn Hake, HOYT Weeks in Treatment: 6 Visit Information History Since Last Visit Added or deleted any medications: No Patient Arrived: Walker Any new allergies or adverse reactions: No Arrival Time: 10:46 Had a fall or experienced change in No Accompanied By: husband activities of daily living that may affect Transfer Assistance: None risk of falls: Patient Identification Verified: Yes Signs or symptoms of abuse/neglect since last visito No Secondary Verification Process Completed: Yes Hospitalized since last visit: No Implantable device outside of the clinic excluding No cellular tissue based products placed in the center since last visit: Pain Present Now: No Electronic Signature(s) Signed: 07/08/2018 1:06:35 PM By: Lorine Bears RCP, RRT, CHT Entered By: Becky Sax, Amado Nash on 07/08/2018 10:46:57 [name] ([id number]) -------------------------------------------------------------------------------- Encounter Discharge Information Details Patient Name: [name]. Date of Service: 07/08/2018 10:30 AM Medical Record Number: [id number] Patient Account Number: [id number] Date of Birth/Sex: 1940-12-23 (77 y.o. F) Treating RN: Harold Barban Primary Care Alexee Delsanto: Ramonita Lab Other Clinician: Jacqulyn Bath Referring Lera Gaines: Ramonita Lab Treating Shoshannah Faubert/Extender: Melburn Hake, HOYT Weeks in Treatment: 6 Encounter Discharge Information Items Discharge Condition: Stable Ambulatory Status: Walker Discharge Destination:  Home Transportation: Private Auto Accompanied By: husband Schedule Follow-up Appointment: No Clinical Summary of Care: Notes Patient has an HBO treatment scheduled on 07/09/18 at 10:30 am. Electronic Signature(s) Signed: 07/08/2018 1:06:35 PM By: Lorine Bears RCP, RRT, CHT Entered By: Lorine Bears on 07/08/2018 12:46:33 [name] ([id number]) -------------------------------------------------------------------------------- Patient/Caregiver Education Details Patient Name: [name] Date of Service: 07/08/2018 10:30 AM Medical Record Number: [id number] Patient Account Number: [id number] Date of Birth/Gender: 27-Dec-1940 (77 y.o. F) Treating RN: Harold Barban Primary Care Physician: Ramonita Lab Other Clinician: Jacqulyn Bath Referring Physician: Ramonita Lab Treating Physician/Extender: Sharalyn Ink in Treatment: 6 Education Assessment Education Provided To: Patient Education Topics Provided Hyperbaric Oxygenation: Responses: State content correctly Electronic Signature(s) Signed: 07/08/2018 1:06:35 PM By: Lorine Bears RCP, RRT, CHT Entered By: Becky Sax, Amado Nash on 07/08/2018 12:45:10 [name] ([id number]) -------------------------------------------------------------------------------- Vitals Details Patient Name: [name] Date of Service: 07/08/2018 10:30 AM Medical Record Number: [id number] Patient Account Number: [id number] Date of Birth/Sex: Apr 18, 1941 (77 y.o. F) Treating RN: Harold Barban Primary Care Eriel Doyon: Ramonita Lab Other Clinician: Jacqulyn Bath Referring Fadi Menter: Ramonita Lab Treating Kaci Dillie/Extender: Melburn Hake, HOYT Weeks in Treatment: 6 Vital Signs Time Taken: 10:25 Temperature (F): 97.8 Height (in): 64 Pulse (bpm): 72 Weight (lbs): 164 Respiratory Rate (breaths/min): 16 Body Mass Index (BMI): 28.1 Blood Pressure (mmHg): 114/60 Reference  Range: 80 - 120 mg / dl Electronic Signature(s) Signed: 07/08/2018 1:06:35 PM By: Lorine Bears RCP, RRT, CHT Entered By: Lorine Bears on 07/08/2018 10:47:23

## 2018-07-11 NOTE — Progress Notes (Signed)
SAVITA, RUNNER ([id number]) Visit Report for 07/08/2018 HBO Details Patient Name: [name], [name]. Date of Service: 07/08/2018 10:30 AM Medical Record Number: [id number] Patient Account Number: [id number] Date of Birth/Sex: Sep 02, 1940 (77 y.o. 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) HBO Treatment Details Treatment Number: 19 Patient Type: Outpatient Chamber Type: Monoplace Chamber Serial #: [id number] Treatment Protocol: 2.0 ATA with 90 minutes oxygen, with two 5 minute air breaks Treatment Details Compression Rate Down: 1.5 psi / minute De-Compression Rate Up: 2.0 psi / minute Compress Tx Pressure Air breaks and breathing periods Decompress Decompress Begins Reached (leave unused spaces blank) Begins Ends Chamber Pressure (ATA) 1 2 2 2 2 2  --2 1 Clock Time (24 hr) 10:34 10:44 11:15 11:20 11:51 11:56 - - 12:26 12:34 Treatment Length: 120 (minutes) Treatment Segments: 4 Capillary Blood Glucose Pre Capillary Blood Glucose (mg/dl): Post Capillary Blood Glucose (mg/dl): Vital Signs Capillary Blood Glucose Reference Range: 80 - 120 mg / dl HBO Diabetic Blood Glucose Intervention Range: <131 mg/dl or >249 mg/dl Time Vitals Blood Respiratory Capillary Blood Glucose Pulse Action Type: Pulse: Temperature: Taken: Pressure: Rate: Glucose (mg/dl): Meter #: Oximetry (%) Taken: Pre 10:25 114/60 72 16 97.8 Post 12:40 116/70 72 16 97.9 Treatment Response Treatment Toleration: Well Treatment Completion Treatment Completed without Adverse Event Status: HBO Attestation I certify that I supervised this HBO  treatment in accordance with Medicare guidelines. A trained emergency response Yes team is readily available per hospital policies and procedures. Continue HBOT as ordered. Yes [name] ([id number]) Electronic Signature(s) Signed: 07/09/2018 8:01:46 PM By: Worthy Keeler PA-C Previous Signature: 07/08/2018 1:06:35 PM Version By: Lorine Bears RCP, RRT, CHT Entered By: Worthy Keeler on 07/09/2018 10:14:23 [name] ([id number]) -------------------------------------------------------------------------------- HBO Safety Checklist Details Patient Name: [name]. Date of Service: 07/08/2018 10:30 AM Medical Record Number: [id number] Patient Account Number: [id number] Date of Birth/Sex: 12-03-1940 (77 y.o. F) Treating RN: Harold Barban Primary Care Khrystyna Schwalm: Ramonita Lab Other Clinician: Jacqulyn Bath Referring Asencion Loveday: Ramonita Lab Treating Sahar Ryback/Extender: Melburn Hake, HOYT Weeks in Treatment: 6 HBO Safety Checklist Items Safety Checklist Consent Form Signed Patient voided / foley secured and emptied When did you last eato 07/08/18 am Last dose of injectable or oral agent n/a Ostomy pouch emptied and vented if applicable NA All implantable devices assessed, documented and approved NA Intravenous access site secured and place NA Valuables secured Linens and cotton and cotton/polyester blend (less than 51% polyester) Personal oil-based products / skin lotions / body lotions removed Wigs or hairpieces removed NA Smoking or tobacco materials removed NA Books / newspapers / magazines / loose paper removed Cologne, aftershave, perfume and deodorant removed Jewelry removed (may wrap wedding band) Make-up removed NA Hair care products removed Battery operated devices (external) removed Heating patches and chemical warmers removed Titanium eyewear removed Nail polish cured greater than 10 hours NA Casting material cured greater than 10  hours NA Hearing aids removed Loose dentures or partials removed NA Prosthetics have been removed NA Patient demonstrates correct use of air break device (if applicable) Patient concerns have been addressed Patient grounding bracelet on and cord attached to chamber Specifics for Inpatients (complete in addition to above) Medication sheet sent with  patient Intravenous medications needed or due during therapy sent with patient Drainage tubes (e.g. nasogastric tube or chest tube secured and vented) Endotracheal or Tracheotomy tube secured Cuff deflated of air and inflated with saline Airway suctioned Electronic Signature(s) Signed: 07/08/2018 1:06:35 PM By: Lorine Bears RCP, RRT, CHT Entered By: Lorine Bears on 07/08/2018 11:06:52 Michele Oaks R. ([id number])

## 2018-07-11 NOTE — Progress Notes (Signed)
AVEY, MCMANAMON ([id number]) Visit Report for 07/09/2018 Problem List Details Patient Name: [name], [name]. Date of Service: 07/09/2018 10:30 AM Medical Record Number: [id number] Patient Account Number: [id number] Date of Birth/Sex: November 21, 1940 (77 y.o. 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 Date of Service: 07/09/2018 Medical Record Number: [id number] Patient Account Number: [id number] Date of Birth/Sex: 07-31-1940 (77 y.o. 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 C50.812 Malignant  neoplasm of overlapping sites of left female breast Facility Procedures CPT4 Code: 50093818 Description: (Facility Use Only) HBOT, full body chamber, 72min Modifier: Quantity: 4 Physician Procedures CPT4: Description Modifier Quantity Code [id number] - WC PHYS HYPERBARIC OXYGEN THERAPY 1 ICD-10 Diagnosis Description 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

## 2018-07-11 NOTE — Progress Notes (Signed)
[name], [name] ([id number]) Visit Report for 07/09/2018 Arrival Information Details Patient Name: [name], RAO. Date of Service: 07/09/2018 10:30 AM Medical Record Number: [id number] Patient Account Number: [id number] Date of Birth/Sex: 07-20-1941 (77 y.o. F) Treating RN: Montey Hora Primary Care Arcangel Minion: Ramonita Lab Other Clinician: Jacqulyn Bath Referring Shelly Spenser: Ramonita Lab Treating Crystalle Popwell/Extender: Melburn Hake, HOYT Weeks in Treatment: 6 Visit Information History Since Last Visit Added or deleted any medications: No Patient Arrived: [name] Any new allergies or adverse reactions: No Arrival Time: 10:18 Had a fall or experienced change in No Accompanied By: husband activities of daily living that may affect Transfer Assistance: None risk of falls: Patient Identification Verified: Yes Signs or symptoms of abuse/neglect since last visito No Secondary Verification Process Completed: Yes Hospitalized since last visit: No Implantable device outside of the clinic excluding No cellular tissue based products placed in the center since last visit: Pain Present Now: No Electronic Signature(s) Signed: 07/09/2018 2:25:39 PM By: Lorine Bears RCP, RRT, CHT Entered By: Lorine Bears on 07/09/2018 10:19:02 [name] ([id number]) -------------------------------------------------------------------------------- Encounter Discharge Information Details Patient Name: [name]. Date of Service: 07/09/2018 10:30 AM Medical Record Number: [id number] Patient Account Number: [id number] Date of Birth/Sex: [birth year] (77 y.o. F) Treating RN: Montey Hora Primary Care Tacora Athanas: Ramonita Lab Other Clinician: Jacqulyn Bath Referring Anadalay Macdonell: Ramonita Lab Treating Gregroy Dombkowski/Extender: Melburn Hake, HOYT Weeks in Treatment: 6 Encounter Discharge Information Items Discharge Condition: Stable Ambulatory Status: Cane Discharge Destination:  Home Transportation: Private Auto Accompanied By: husband Schedule Follow-up Appointment: No Clinical Summary of Care: Notes Patient has completed her course of 20 HBO treatments for ISSHL. Electronic Signature(s) Signed: 07/09/2018 2:25:39 PM By: Lorine Bears RCP, RRT, CHT Entered By: Lorine Bears on 07/09/2018 12:31:17 [name] ([id number]) -------------------------------------------------------------------------------- Patient/Caregiver Education Details Patient Name: [name]. Date of Service: 07/09/2018 10:30 AM Medical Record Number: [id number] Patient Account Number: [id number] Date of Birth/Gender: 1940/12/23 (77 y.o. F) Treating RN: Montey Hora Primary Care Physician: Ramonita Lab Other Clinician: Jacqulyn Bath Referring Physician: Ramonita Lab Treating Physician/Extender: Sharalyn Ink in Treatment: 6 Education Assessment Education Provided To: Patient and Caregiver Education Topics Provided Hyperbaric Oxygenation: Responses: State content correctly Electronic Signature(s) Signed: 07/09/2018 2:25:39 PM By: Lorine Bears RCP, RRT, CHT Entered By: Lorine Bears on 07/09/2018 12:29:24 [name] ([id number]) -------------------------------------------------------------------------------- Vitals Details Patient Name: [name] Date of Service: 07/09/2018 10:30 AM Medical Record Number: [id number] Patient Account Number: [id number] Date of Birth/Sex: [birth year] (77 y.o. F) Treating RN: Montey Hora Primary Care Antavia Tandy: Ramonita Lab Other Clinician: Jacqulyn Bath Referring Myli Pae: Ramonita Lab Treating Zollie Clemence/Extender: Melburn Hake, HOYT Weeks in Treatment: 6 Vital Signs Time Taken: 10:10 Pulse (bpm): 78 Height (in): 64 Respiratory Rate (breaths/min): 16 Weight (lbs): 164 Blood Pressure (mmHg): 112/58 Body Mass Index (BMI): 28.1 Reference Range: 80 - 120  mg / dl Electronic Signature(s) Signed: 07/09/2018 2:25:39 PM By: Lorine Bears RCP, RRT, CHT Entered By: Lorine Bears on 07/09/2018 10:19:30

## 2018-07-11 NOTE — Progress Notes (Signed)
CLARIZA, SICKMAN ([id number]) Visit Report for 07/06/2018 Problem List Details Patient Name: QUIARA, KILLIAN. Date of Service: 07/06/2018 10:30 AM Medical Record Number: [id number] Patient Account Number: [id number] Date of Birth/Sex: 05/04/1941 (77 y.o. 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 Date of Service: 07/06/2018 Medical Record Number: [id number] Patient Account Number: [id number] Date of Birth/Sex: 1940/09/03 (77 y.o. 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 C50.812 Malignant  neoplasm of overlapping sites of left female breast Facility Procedures CPT4 Code: [id number] Description: (Facility Use Only) HBOT, full body chamber, 16min Modifier: Quantity: 4 Physician Procedures CPT4: Description Modifier Quantity Code [id number] - WC PHYS HYPERBARIC OXYGEN THERAPY 1 ICD-10 Diagnosis Description 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

## 2018-07-11 NOTE — Progress Notes (Signed)
COLLENE, MASSIMINO ([id number]) Visit Report for 07/09/2018 HBO Details Patient Name: [name], [name]. Date of Service: 07/09/2018 10:30 AM Medical Record Number: [id number] Patient Account Number: [id number] Date of Birth/Sex: 1940/10/11 (77 y.o. 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) HBO Treatment 07/09/2018 End Date: HBO Discharge Treatment Series Complete; Non-Wound Outcome: Protocol Completed with Symptom Relief HBO Treatment Details Treatment Number: 20 Patient Type: Outpatient Chamber Type: Monoplace Chamber Serial #: [id number] Treatment Protocol: 2.0 ATA with 90 minutes oxygen, with two 5 minute air breaks Treatment Details Compression Rate Down: 1.5 psi / minute De-Compression Rate Up: 2.0 psi / minute Compress Tx Pressure Air breaks and breathing periods Decompress Decompress Begins Reached (leave unused spaces blank) Begins Ends Chamber Pressure (ATA) 1 2 2 2 2 2  --2 1 Clock Time (24 hr) 10:16 10:27 10:57 11:03 11:33 11:38 - - 12:08 12:17 Treatment Length: 121 (minutes) Treatment Segments: 4 Capillary Blood Glucose Pre Capillary Blood Glucose (mg/dl): Post Capillary Blood Glucose (mg/dl): Vital Signs Capillary Blood Glucose Reference Range: 80 - 120 mg / dl HBO Diabetic Blood Glucose Intervention Range: <131 mg/dl or >249 mg/dl Time Vitals Blood Respiratory Capillary Blood Glucose Pulse Action Type: Pulse: Temperature: Taken: Pressure: Rate: Glucose (mg/dl): Meter #: Oximetry (%) Taken: Pre 10:10 112/58 78 16 Post 12:23 110/66 72 16 97.7 Treatment Response Treatment  Toleration: Well Treatment Completion Treatment Completed without Adverse Event Status: HBO Attestation Yes Rigel, Katreena R. ([id number]) I certify that I supervised this HBO treatment in accordance with Medicare guidelines. A trained emergency response team is readily available per hospital policies and procedures. Continue HBOT as ordered. Yes Electronic Signature(s) Signed: 07/09/2018 8:01:46 PM By: Worthy Keeler PA-C Entered By: Worthy Keeler on 07/09/2018 13:01:35 [name] ([id number]) -------------------------------------------------------------------------------- HBO Safety Checklist Details Patient Name: [name]. Date of Service: 07/09/2018 10:30 AM Medical Record Number: [id number] Patient Account Number: [id number] Date of Birth/Sex: [birth year] (77 y.o. F) Treating RN: Montey Hora Primary Care Walaa Carel: Ramonita Lab Other Clinician: Jacqulyn Bath Referring Eastin Swing: Ramonita Lab Treating Ante Arredondo/Extender: Melburn Hake, HOYT Weeks in Treatment: 6 HBO Safety Checklist Items Safety Checklist Consent Form Signed Patient voided / foley secured and emptied When did you last eato 07/09/18 Last dose of injectable or oral agent n/a Ostomy pouch emptied and vented if applicable NA All implantable devices assessed, documented and approved NA Intravenous access site secured and place NA Valuables secured Linens and cotton and cotton/polyester blend (less than 51% polyester) Personal oil-based products / skin lotions / body lotions removed Wigs or hairpieces removed NA Smoking or tobacco materials removed NA Books / newspapers / magazines / loose paper removed Cologne, aftershave, perfume and deodorant removed Jewelry removed (may wrap wedding band) Make-up removed Hair care products removed Battery operated devices (external) removed Heating patches and chemical warmers removed Titanium eyewear removed Nail polish cured greater than 10  hours NA Casting material cured greater than 10 hours NA Hearing aids removed Loose dentures or partials removed NA Prosthetics have been removed NA Patient demonstrates correct use of air break device (if applicable) Patient concerns have been addressed Patient grounding bracelet on and cord attached to chamber Specifics for Inpatients (complete in addition to above) Medication sheet sent  with patient Intravenous medications needed or due during therapy sent with patient Drainage tubes (e.g. nasogastric tube or chest tube secured and vented) Endotracheal or Tracheotomy tube secured Cuff deflated of air and inflated with saline Airway suctioned Electronic Signature(s) Signed: 07/09/2018 2:25:39 PM By: Lorine Bears RCP, RRT, CHT [place], [place] R. ([id number]) Entered By: Lorine Bears on 07/09/2018 10:27:38

## 2018-07-11 NOTE — Progress Notes (Signed)
SUDIKSHA, VICTOR ([id number]) Visit Report for 07/06/2018 HBO Details Patient Name: [name], [name]. Date of Service: 07/06/2018 10:30 AM Medical Record Number: [id number] Patient Account Number: [id number] Date of Birth/Sex: 24-May-1941 (77 y.o. 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) HBO Treatment 07/09/2018 End Date: HBO Discharge Treatment Series Complete; Non-Wound Outcome: Protocol Completed with Symptom Relief HBO Treatment Details Treatment Number: 17 Patient Type: Outpatient Chamber Type: Monoplace Chamber Serial #: [id number] Treatment Protocol: 2.0 ATA with 90 minutes oxygen, with two 5 minute air breaks Treatment Details Compression Rate Down: 1.5 psi / minute De-Compression Rate Up: 2.0 psi / minute Compress Tx Pressure Air breaks and breathing periods Decompress Decompress Begins Reached (leave unused spaces blank) Begins Ends Chamber Pressure (ATA) 1 2 2 2 2 2  --2 1 Clock Time (24 hr) 10:19 10:30 11:01 11:06 11:37 11:42 - - 12:12 12:20 Treatment Length: 121 (minutes) Treatment Segments: 4 Capillary Blood Glucose Pre Capillary Blood Glucose (mg/dl): Post Capillary Blood Glucose (mg/dl): Vital Signs Capillary Blood Glucose Reference Range: 80 - 120 mg / dl HBO Diabetic Blood Glucose Intervention Range: <131 mg/dl or >249 mg/dl Time Vitals Blood Respiratory Capillary Blood Glucose Pulse Action Type: Pulse: Temperature: Taken: Pressure: Rate: Glucose (mg/dl): Meter #: Oximetry (%) Taken: Pre 10:15 128/64 72 16 Post 12:28 108/64 72 16 98 Treatment Response Treatment Completion  Status: Treatment Completed without Adverse Event HBO Attestation I certify that I supervised this HBO treatment in accordance with Medicare guidelines. A trained emergency response Yes team is readily available per hospital policies and procedures. AMARAH, BROSSMAN R. ([id number]) Continue HBOT as ordered. Yes Electronic Signature(s) Signed: 07/09/2018 8:01:46 PM By: Worthy Keeler PA-C Previous Signature: 07/06/2018 1:28:07 PM Version By: Lorine Bears RCP, RRT, CHT Entered By: Worthy Keeler on 07/09/2018 19:02:33 [name] ([id number]) -------------------------------------------------------------------------------- HBO Safety Checklist Details Patient Name: [name]. Date of Service: 07/06/2018 10:30 AM Medical Record Number: [id number] Patient Account Number: [id number] Date of Birth/Sex: [birth year] (77 y.o. F) Treating RN: Montey Hora Primary Care Adlyn Fife: Ramonita Lab Other Clinician: Jacqulyn Bath Referring Everett Ricciardelli: Ramonita Lab Treating Natalye Kott/Extender: Melburn Hake, HOYT Weeks in Treatment: 6 HBO Safety Checklist Items Safety Checklist Consent Form Signed Patient voided / foley secured and emptied When did you last eato 07/06/18 am Last dose of injectable or oral agent n/a Ostomy pouch emptied and vented if applicable NA All implantable devices assessed, documented and approved NA Intravenous access site secured and place NA Valuables secured Linens and cotton and cotton/polyester blend (less than 51% polyester) Personal oil-based products / skin lotions / body lotions removed Wigs or hairpieces removed NA Smoking or tobacco materials removed NA Books / newspapers / magazines / loose paper removed Cologne, aftershave, perfume and deodorant removed Jewelry removed (may wrap wedding band) Make-up removed Hair care products removed Battery operated devices (external) removed Heating patches and chemical warmers removed Titanium  eyewear removed Nail polish cured greater than 10 hours NA Casting material cured greater than 10 hours NA Hearing aids removed Loose dentures or partials removed NA Prosthetics have been removed NA Patient demonstrates correct use of air break device (if applicable) Patient concerns have been addressed Patient grounding bracelet on and cord attached to chamber  Specifics for Inpatients (complete in addition to above) Medication sheet sent with patient Intravenous medications needed or due during therapy sent with patient Drainage tubes (e.g. nasogastric tube or chest tube secured and vented) Endotracheal or Tracheotomy tube secured Cuff deflated of air and inflated with saline Airway suctioned Electronic Signature(s) Signed: 07/06/2018 1:28:07 PM By: Lorine Bears RCP, RRT, CHT [place], Rena Lara R. ([id number]) Entered By: Lorine Bears on 07/06/2018 10:25:56

## 2018-07-11 NOTE — Progress Notes (Signed)
KINLEE, GARRISON ([id number]) Visit Report for 07/08/2018 Problem List Details Patient Name: [name], [name]. Date of Service: 07/08/2018 10:30 AM Medical Record Number: [id number] Patient Account Number: [id number] Date of Birth/Sex: 1940-09-21 (77 y.o. 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 Date of Service: 07/08/2018 Medical Record Number: [id number] Patient Account Number: [id number] Date of Birth/Sex: 08-14-1940 (77 y.o. 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 C50.812 Malignant  neoplasm of overlapping sites of left female breast Facility Procedures CPT4 Code: [id number] Description: (Facility Use Only) HBOT, full body chamber, 21min Modifier: Quantity: 4 Physician Procedures CPT4: Description Modifier Quantity Code [id number] [id number] - WC PHYS HYPERBARIC OXYGEN THERAPY 1 ICD-10 Diagnosis Description 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

## 2018-08-11 ENCOUNTER — Encounter
Admission: RE | Admit: 2018-08-11 | Discharge: 2018-08-11 | Disposition: A | Discharge status: Home / Self Care | Payer: Medicare Other | Source: Ambulatory Visit | Attending: Internal Medicine | Admitting: Internal Medicine

## 2018-08-11 DIAGNOSIS — Z171 Estrogen receptor negative status [ER-]: Secondary | ICD-10-CM | POA: Diagnosis present

## 2018-08-11 DIAGNOSIS — Z5181 Encounter for therapeutic drug level monitoring: Secondary | ICD-10-CM | POA: Insufficient documentation

## 2018-08-11 DIAGNOSIS — Z79899 Other long term (current) drug therapy: Secondary | ICD-10-CM | POA: Diagnosis present

## 2018-08-11 DIAGNOSIS — C50812 Malignant neoplasm of overlapping sites of left female breast: Secondary | ICD-10-CM | POA: Insufficient documentation

## 2018-08-11 MED ORDER — TECHNETIUM TC 99M-LABELED RED BLOOD CELLS IV KIT
23.7300 | PACK | Freq: Once | INTRAVENOUS | Status: AC | PRN
  Administered 2018-08-11: 23.73 via INTRAVENOUS

## 2018-08-17 ENCOUNTER — Telehealth: Payer: Self-pay | Admitting: *Deleted

## 2018-08-17 NOTE — Telephone Encounter (Signed)
Returned patient's husband's phone call. He will not be able to stay with his wife for the entire appointment tomorrow due to a flight. He will need to leave our clinic no later than 915 am. A Sitter name Joycelyn Schmid will then come and relieve pt's husband and take the patient home from her appointment. Husband is concerned that he wants to be able to speak to Dr. B before he leaves. I told the husband that we will make every effort to get the patient in/out of her apts in a timely manner.  Reviewed Ejection fracture results with the patient's husband. Husband was curious if Dr. B would still hold the perjecta/herceptin given a slight drop in Ejection Fracture.  Also, Pt still reports left Axillary tenderness. Husband does not believe that an apt with Dr. Bary Castilla is necessary at this time.

## 2018-08-18 ENCOUNTER — Encounter: Payer: Self-pay | Admitting: Internal Medicine

## 2018-08-18 ENCOUNTER — Inpatient Hospital Stay: Payer: Medicare Other

## 2018-08-18 ENCOUNTER — Inpatient Hospital Stay (HOSPITAL_BASED_OUTPATIENT_CLINIC_OR_DEPARTMENT_OTHER): Payer: Medicare Other | Admitting: Internal Medicine

## 2018-08-18 ENCOUNTER — Inpatient Hospital Stay: Payer: Medicare Other | Attending: Internal Medicine

## 2018-08-18 ENCOUNTER — Other Ambulatory Visit: Payer: Self-pay

## 2018-08-18 VITALS — BP 140/79 | HR 79 | Temp 99.1°F | Resp 20 | Ht 63.0 in | Wt 167.0 lb

## 2018-08-18 DIAGNOSIS — Z95828 Presence of other vascular implants and grafts: Secondary | ICD-10-CM

## 2018-08-18 DIAGNOSIS — C50812 Malignant neoplasm of overlapping sites of left female breast: Secondary | ICD-10-CM | POA: Diagnosis present

## 2018-08-18 DIAGNOSIS — Z171 Estrogen receptor negative status [ER-]: Secondary | ICD-10-CM

## 2018-08-18 DIAGNOSIS — Z452 Encounter for adjustment and management of vascular access device: Secondary | ICD-10-CM | POA: Insufficient documentation

## 2018-08-18 LAB — CBC WITH DIFFERENTIAL/PLATELET
Abs Immature Granulocytes: 0.04 10*3/uL (ref 0.00–0.07)
BASOS ABS: 0 10*3/uL (ref 0.0–0.1)
Basophils Relative: 0 %
EOS PCT: 2 %
Eosinophils Absolute: 0.2 10*3/uL (ref 0.0–0.5)
HEMATOCRIT: 43.7 % (ref 36.0–46.0)
Hemoglobin: 13.9 g/dL (ref 12.0–15.0)
IMMATURE GRANULOCYTES: 1 %
LYMPHS ABS: 1 10*3/uL (ref 0.7–4.0)
LYMPHS PCT: 12 %
MCH: 29.9 pg (ref 26.0–34.0)
MCHC: 31.8 g/dL (ref 30.0–36.0)
MCV: 94 fL (ref 80.0–100.0)
MONOS PCT: 6 %
Monocytes Absolute: 0.5 10*3/uL (ref 0.1–1.0)
NRBC: 0 % (ref 0.0–0.2)
Neutro Abs: 6.5 10*3/uL (ref 1.7–7.7)
Neutrophils Relative %: 79 %
Platelets: 302 10*3/uL (ref 150–400)
RBC: 4.65 MIL/uL (ref 3.87–5.11)
RDW: 13.5 % (ref 11.5–15.5)
WBC: 8.2 10*3/uL (ref 4.0–10.5)

## 2018-08-18 LAB — COMPREHENSIVE METABOLIC PANEL
ALBUMIN: 4.1 g/dL (ref 3.5–5.0)
ALK PHOS: 59 U/L (ref 38–126)
ALT: 19 U/L (ref 0–44)
AST: 22 U/L (ref 15–41)
Anion gap: 9 (ref 5–15)
BUN: 21 mg/dL (ref 8–23)
CALCIUM: 8.8 mg/dL — AB (ref 8.9–10.3)
CHLORIDE: 102 mmol/L (ref 98–111)
CO2: 27 mmol/L (ref 22–32)
CREATININE: 0.84 mg/dL (ref 0.44–1.00)
GFR calc non Af Amer: >60 mL/min (ref >60)
GLUCOSE: 159 mg/dL — AB (ref 70–99)
Potassium: 4 mmol/L (ref 3.5–5.1)
SODIUM: 138 mmol/L (ref 135–145)
Total Bilirubin: 0.7 mg/dL (ref 0.3–1.2)
Total Protein: 7.2 g/dL (ref 6.5–8.1)

## 2018-08-18 MED ORDER — SODIUM CHLORIDE 0.9% FLUSH
10.0000 mL | Freq: Once | INTRAVENOUS | Status: AC
  Administered 2018-08-18: 10 mL via INTRAVENOUS
  Filled 2018-08-18: qty 10

## 2018-08-18 MED ORDER — HEPARIN SOD (PORK) LOCK FLUSH 100 UNIT/ML IV SOLN
500.0000 [IU] | Freq: Once | INTRAVENOUS | Status: AC
  Administered 2018-08-18: 500 [IU]
  Filled 2018-08-18: qty 5

## 2018-08-18 NOTE — Progress Notes (Signed)
[place] OFFICE PROGRESS NOTE  Patient Care Team: [name], [name], MD (General Surgery) Requested, [name]   Oncology History   # June 2016- LEFT BREAST CA;  [name] ; [name] ; [name]; Her 2 Neu POS; [place] from July OF 2016; [name]; adjuvant Herceptin [ Finished July 2017]; AUG 2017- Neratinib x5 days; DISCON sec to diarrhea  # MID OCT 2018- Right breast mass-Bx- ER/PR-NEG; Her 2 NEU POSITIVE 1~2.5cm;  [?NEW primary]  # MID-OCT 2018-METASTATIC RECURRENT-oh sternal mass; Left Ax LN [Bx]/periportal LN  # OCT 25th 2018- TAXOL-HERCEPTIN-PERJETA; Jan 2019- CT PR; continue HP only.    -------------------------------------------------------------------  # MUGA scan- July 28th 2017- 67%.  December 2019-EF 53%; January 2020 EF-52%  # chronic gait/balance issues  # June 2017- left breast Bx- fat necrosis [[name]  # july 2017-  BRCA 1& 2- NEG.   MOLECULAR TESTING- F ONE- TPS- 0%;  ERB2 amplification; PI3K/RET amplification Others**  --------------------------------------------------    DIAGNOSIS: [ OCT 2018]- REC/MET- BREAST CA ER/PR-NEG; her 2 POS  STAGE: 4  ;GOALS: Palliative  CURRENT/MOST RECENT THERAPY- Herceptin-Perjeta      Carcinoma of overlapping sites of left breast in female, estrogen receptor negative ([place])      INTERVAL HISTORY:  [name] 78 y.o.  female pleasant patient above history of metastatic breast cancer on Herceptin plus Perjeta is here for follow-up/review the results of her MUGA scan.  Patient denies any unusual shortness of breath or cough.  Continues to have chronic gait instability/chronic hip pain.  Not any worse.  No falls.  Continues to complain of chronic mild-moderate fatigue.  Continues to have memory problems.  Chronic left  axilla/chest wall pain.  Not any worse.  Review of Systems  Constitutional: Negative for chills, diaphoresis, fever, malaise/fatigue and weight loss.  HENT: Positive for hearing loss (Left-sided.). Negative for nosebleeds and sore throat.   Eyes: Negative for double vision.  Respiratory: Negative for cough, hemoptysis, sputum production, shortness of breath and wheezing.   Cardiovascular: Negative for chest pain, palpitations, orthopnea and leg swelling.  Gastrointestinal: Negative for abdominal pain, blood in stool, constipation, diarrhea, heartburn, melena, nausea and vomiting.  Genitourinary: Negative for dysuria, frequency and urgency.  Musculoskeletal: Positive for back pain and joint pain.  Skin: Negative.  Negative for itching and rash.  Neurological: Negative for dizziness, tingling, focal weakness, weakness and headaches.  Endo/Heme/Allergies: Does not bruise/bleed easily.  Psychiatric/Behavioral: Positive for memory loss. Negative for depression. The patient is not nervous/anxious and does not have insomnia.       PAST MEDICAL HISTORY :  Past Medical History:  Diagnosis Date  . Arthritis   . Brain tumor ([place]) 1995   meningeoma  . Breast cancer ([place]) 2016   left breast; surgery 01/11/15 with Dr. Bary Castilla; path with invasive mammary and DCIS  . Breast cancer of upper-inner quadrant of left female breast ([place]) 12/15/14   Completed radiation end of December and finished chemotherapy 2 weeks ago, Left breast invasive mammary carcinoma, T1cN17mc (1.5 cm); Grade 3, [name], [name], [name], .  .Marland KitchenCataract    bilat   . DDD (degenerative disc disease), lumbar    Lumbar, previously evaluated by Dr. [place] Leys . [place]   . Fibrocystic breast disease  prior biopsy  . Glaucoma   . 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 Hearing loss   . History of cancer chemotherapy   . History of radiation therapy   . Hyperlipidemia,  unspecified   . Imbalance   . Memory impairment    seen by Dr Manuella Ghazi  possible post crainiotomy from radiation  . 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  . Numbness and tingling    right hand   . Osteoarthritis   . Osteoporosis, post-menopausal   . Personal history of chemotherapy   . Personal history of radiation therapy   . Shoulder pain, right   . Wears glasses     PAST SURGICAL HISTORY :   Past Surgical History:  Procedure Laterality Date  . APPENDECTOMY  1950  . BRAIN SURGERY  1995   left frontal/temporal  . BREAST BIOPSY Left 1997  . BREAST BIOPSY Left 12/15/14   confirmed DCIS  . BREAST BIOPSY Left 01/08/2015   Procedure: BREAST BIOPSY WITH NEEDLE LOCALIZATION;  Surgeon: [name], MD;  Location: [facility] ORS;  Service: General;  Laterality: Left;  . BREAST LUMPECTOMY Left 01/08/2015   Procedure: LUMPECTOMY;  Surgeon: [name], MD;  Location: [facility] ORS;  Service: General;  Laterality: Left;  . COLONOSCOPY  2010   Dr. [name]  . ORIF ANKLE FRACTURE Right 08/05/2017   Procedure: OPEN REDUCTION INTERNAL FIXATION (ORIF) ANKLE FRACTURE;  Surgeon: Earnestine Leys, MD;  Location: [facility] ORS;  Service: Orthopedics;  Laterality: Right;  . PORTACATH PLACEMENT Right 01/16/2015   Procedure: INSERTION PORT-A-CATH;  Surgeon: [name], MD;  Location: [facility] ORS;  Service: General;  Laterality: Right;  . SENTINEL NODE BIOPSY Left 01/16/2015   Procedure: SENTINEL NODE BIOPSY;  Surgeon: [name], MD;  Location: [facility];  Service: General;  Laterality: Left;  . TOTAL HIP ARTHROPLASTY Right 09/04/2015   Procedure: RIGHT TOTAL HIP ARTHROPLASTY ANTERIOR APPROACH;  Surgeon: [name], MD;  Location: [facility];  Service: Orthopedics;  Laterality: Right;    FAMILY HISTORY :   Family History  Problem Relation Age of Onset  . Breast cancer Sister 72  . Addison's disease Mother   . Hyperthyroidism Mother    . Osteoarthritis Mother   . Stroke Father   . Heart disease Father   . High blood pressure Father     SOCIAL HISTORY:   Social History   Tobacco Use  . Smoking status: Former Smoker    Packs/day: 0.25    Years: 5.00    Pack years: 1.25    Types: Cigarettes    Last attempt to quit: 07/28/1972    Years since quitting: 46.1  . Smokeless tobacco: Never Used  Substance Use Topics  . Alcohol use: Yes    Alcohol/week: 1.0 - 2.0 standard drinks    Types: 1 - 2 Glasses of wine per week    Comment: 1 Glass Wine / Night  . Drug use: No    ALLERGIES:  is allergic to donepezil.  MEDICATIONS:  Current Outpatient Medications  Medication Sig Dispense Refill  . acetaminophen (TYLENOL) 325 MG tablet Take 650 mg by mouth every 4 (four) hours as needed. Pain / increased temp.    Marland Kitchen aspirin 325 MG EC tablet Take 325 mg by mouth 2 (two) times daily.     . Calcium Carbonate-Vit D-Min (CALTRATE 600+D PLUS MINERALS) 600-800 MG-UNIT TABS Take  1 tablet by mouth every morning.     . Calcium Carbonate-Vitamin D (CALTRATE 600+D PO) Take 1 tablet by mouth daily.    . celecoxib (CELEBREX) 200 MG capsule Take 200 mg by mouth 2 (two) times daily.    . Cetirizine HCl (ZYRTEC ALLERGY PO) Take 1 tablet by mouth as needed (allergies).    . Cholecalciferol (VITAMIN D-3) 5000 units TABS Take 1 tablet by mouth daily.    Marland Kitchen levETIRAcetam (KEPPRA) 250 MG tablet 1 tablet daily at bedtime  5  . naproxen sodium (ALEVE) 220 MG tablet Take 220 mg by mouth daily as needed (pain).    . traMADol (ULTRAM) 50 MG tablet Take 1 tablet (50 mg total) by mouth 2 (two) times daily as needed for moderate pain. 60 tablet 0  . vitamin C (ASCORBIC ACID) 250 MG tablet Take 250 mg by mouth daily.    . vitamin E 400 UNIT capsule Take 400 Units by mouth daily.     . bisacodyl (DULCOLAX) 10 MG suppository Place 1 suppository (10 mg total) rectally daily as needed for moderate constipation. (Patient not taking: Reported on 05/26/2018) 12  suppository 0  . prochlorperazine (COMPAZINE) 10 MG tablet Take 1 tablet (10 mg total) every 6 (six) hours as needed by mouth for nausea or vomiting. (Patient not taking: Reported on 07/07/2018) 40 tablet 1   No current facility-administered medications for this visit.    Facility-Administered Medications Ordered in Other Visits  Medication Dose Route Frequency Provider Last Rate Last Dose  . sodium chloride flush (NS) 0.9 % injection 10 mL  10 mL Intravenous PRN Cammie Sickle, MD   10 mL at 07/07/18 0830    PHYSICAL EXAMINATION: ECOG PERFORMANCE STATUS: 1 - Symptomatic but completely ambulatory  BP 140/79 (Patient Position: Sitting)   Pulse 79   Temp 99.1 F (37.3 C) (Oral)   Resp 20   Ht 5' 3"  (1.6 m)   Wt 167 lb (75.8 kg)   BMI 29.58 kg/m   Filed Weights   08/18/18 0849  Weight: 167 lb (75.8 kg)    Physical Exam  Constitutional: She is oriented to person, place, and time and well-developed, well-nourished, and in no distress.  In wheel chair/ sec to gait instability; with husband.   HENT:  Head: Normocephalic and atraumatic.  Mouth/Throat: Oropharynx is clear and moist. No oropharyngeal exudate.  Eyes: Pupils are equal, round, and reactive to light.  Neck: Normal range of motion. Neck supple.  Cardiovascular: Normal rate and regular rhythm.  Pulmonary/Chest: No respiratory distress. She has no wheezes.  Abdominal: Soft. Bowel sounds are normal. She exhibits no distension and no mass. There is no abdominal tenderness. There is no rebound and no guarding.  Musculoskeletal: Normal range of motion.        General: No tenderness or edema.  Neurological: She is alert and oriented to person, place, and time.  Skin: Skin is warm.  Psychiatric: Affect normal.       LABORATORY DATA:  I have reviewed the data as listed    Component Value Date/Time   NA 138 08/18/2018 0811   K 4.0 08/18/2018 0811   CL 102 08/18/2018 0811   CO2 27 08/18/2018 0811   GLUCOSE 159  (H) 08/18/2018 0811   BUN 21 08/18/2018 0811   CREATININE 0.84 08/18/2018 0811   CALCIUM 8.8 (L) 08/18/2018 0811   PROT 7.2 08/18/2018 0811   ALBUMIN 4.1 08/18/2018 0811   AST 22 08/18/2018 0811   ALT  19 08/18/2018 0811   ALKPHOS 59 08/18/2018 0811   BILITOT 0.7 08/18/2018 0811   GFRNONAA >60 08/18/2018 0811   GFRAA >60 08/18/2018 0811    No results found for: SPEP, UPEP  Lab Results  Component Value Date   WBC 8.2 08/18/2018   NEUTROABS 6.5 08/18/2018   HGB 13.9 08/18/2018   HCT 43.7 08/18/2018   MCV 94.0 08/18/2018   PLT 302 08/18/2018      Chemistry      Component Value Date/Time   NA 138 08/18/2018 0811   K 4.0 08/18/2018 0811   CL 102 08/18/2018 0811   CO2 27 08/18/2018 0811   BUN 21 08/18/2018 0811   CREATININE 0.84 08/18/2018 0811      Component Value Date/Time   CALCIUM 8.8 (L) 08/18/2018 0811   ALKPHOS 59 08/18/2018 0811   AST 22 08/18/2018 0811   ALT 19 08/18/2018 0811   BILITOT 0.7 08/18/2018 0811       RADIOGRAPHIC STUDIES: I have personally reviewed the radiological images as listed and agreed with the findings in the report. No results found.   ASSESSMENT & PLAN:  Carcinoma of overlapping sites of left breast in female, estrogen receptor negative ([place]) #Metastatic breast cancer ER [name] HER-2/neu positive-SEP 16th 2019 CT scan NED; stable.  On Herceptin/perjeta maintenance.  # Hold Herceptin Perjeta today-given the drop in ejection fraction/non-recovery of ejection fraction see discussion below.  #Drop in ejection fraction-baseline 68%; no significant improvement in ejection fraction from 52% [July 2020] asymptomatic.  Continue to hold treatment as discussed above  # ? Fatigue- Sec to above vs others-stable  #Chronic dementia/debility-stable  # left ear hearing loss-? Neuro-vascular HBO- evaluation s/p HBO-stable  # chronic hip/back  pain-stable tramadol prn;   DISPOSITION:  # Hold treatment today; #  follow up in 1 month-MD  labs/ cbc/cmp-CT scan c/a/p prior; no treatment- dr.B   Orders Placed This Encounter  Procedures  . CT CHEST W CONTRAST    Standing Status:   Future    Standing Expiration Date:   08/19/2019    Order Specific Question:   If indicated for the ordered procedure, I authorize the administration of contrast media per Radiology protocol    Answer:   Yes    Order Specific Question:   Preferred imaging location?    Answer:   [facility]    Order Specific Question:   Radiology Contrast Protocol - do NOT remove file path    Answer:   [email or web address]    Order Specific Question:   ** REASON FOR EXAM (FREE TEXT)    Answer:   breast cancer  . CT Abdomen Pelvis W Contrast    Standing Status:   Future    Standing Expiration Date:   08/18/2019    Order Specific Question:   ** REASON FOR EXAM (FREE TEXT)    Answer:   breast cancer    Order Specific Question:   If indicated for the ordered procedure, I authorize the administration of contrast media per Radiology protocol    Answer:   Yes    Order Specific Question:   Preferred imaging location?    Answer:   [facility]    Order Specific Question:   Is Oral Contrast requested for this exam?    Answer:   Yes, Per Radiology protocol    Order Specific Question:   Radiology Contrast Protocol - do NOT remove file path    Answer:   \\[email or web address]  . CBC with  Differential/Platelet    Standing Status:   Future    Standing Expiration Date:   08/19/2019  . Comprehensive metabolic panel    Standing Status:   Future    Standing Expiration Date:   08/19/2019   All questions were answered. The patient knows to call the clinic with any problems, questions or concerns.      Cammie Sickle, MD 09/09/2018 4:30 PM

## 2018-08-18 NOTE — Assessment & Plan Note (Addendum)
#  Metastatic breast cancer ER [name] HER-2/neu positive-SEP 16th 2019 CT scan NED; stable.  On Herceptin/perjeta maintenance.  # Hold Herceptin Perjeta today-given the drop in ejection fraction/non-recovery of ejection fraction see discussion below.  #Drop in ejection fraction-baseline 68%; no significant improvement in ejection fraction from 52% [July 2020] asymptomatic.  Continue to hold treatment as discussed above  # ? Fatigue- Sec to above vs others-stable  #Chronic dementia/debility-stable  # left ear hearing loss-? Neuro-vascular HBO- evaluation s/p HBO-stable  # chronic hip/back  pain-stable tramadol prn;   DISPOSITION:  # Hold treatment today; #  follow up in 1 month-MD labs/ cbc/cmp-CT scan c/a/p prior; no treatment- dr.B

## 2018-09-06 DIAGNOSIS — S93409A Sprain of unspecified ligament of unspecified ankle, initial encounter: Secondary | ICD-10-CM | POA: Insufficient documentation

## 2018-09-13 ENCOUNTER — Ambulatory Visit
Admission: RE | Admit: 2018-09-13 | Discharge: 2018-09-13 | Disposition: A | Discharge status: Home / Self Care | Payer: Medicare Other | Source: Ambulatory Visit | Attending: Internal Medicine | Admitting: Internal Medicine

## 2018-09-13 ENCOUNTER — Telehealth: Payer: Self-pay | Admitting: Internal Medicine

## 2018-09-13 ENCOUNTER — Telehealth: Payer: Self-pay | Admitting: *Deleted

## 2018-09-13 DIAGNOSIS — C50812 Malignant neoplasm of overlapping sites of left female breast: Secondary | ICD-10-CM | POA: Insufficient documentation

## 2018-09-13 DIAGNOSIS — Z171 Estrogen receptor negative status [ER-]: Secondary | ICD-10-CM | POA: Diagnosis present

## 2018-09-13 DIAGNOSIS — N3090 Cystitis, unspecified without hematuria: Secondary | ICD-10-CM

## 2018-09-13 MED ORDER — IOPAMIDOL (ISOVUE-300) INJECTION 61%
100.0000 mL | Freq: Once | INTRAVENOUS | Status: AC | PRN
  Administered 2018-09-13: 100 mL via INTRAVENOUS

## 2018-09-13 NOTE — Telephone Encounter (Signed)
Labs added.

## 2018-09-13 NOTE — Telephone Encounter (Signed)
pt has no symptoms of UTI at this time.   Please order UA at next visit; husband updated.  Thx GB

## 2018-09-13 NOTE — Addendum Note (Signed)
Addended by: Sabino Gasser on: 09/13/2018 02:48 PM   Modules accepted: Orders

## 2018-09-13 NOTE — Telephone Encounter (Signed)
Called report  ADDENDUM REPORT: 09/13/2018 10:56  ADDENDUM: Impression also includes:  3. Small amount of nonspecific gas in the nondependent bladder. Correlate for any history of recent bladder instrumentation. If there is no history of recent bladder instrumentation to explain this gas, suggest correlation with urinalysis to exclude gas producing cystitis.  The original report is otherwise unchanged.  These results will be called to the ordering clinician or representative by the Radiologist Assistant, and communication documented in the PACS or zVision Dashboard.   Electronically Signed   By: Ilona Sorrel M.D.   On: 09/13/2018 10:56   Addended by Sharyn Blitz, MD on 09/13/2018 10:58 AM    Study Result   CLINICAL DATA:  Stage IV left breast cancer with ongoing maintenance therapy, presenting for restaging.  EXAM: CT CHEST, ABDOMEN, AND PELVIS WITH CONTRAST  TECHNIQUE: Multidetector CT imaging of the chest, abdomen and pelvis was performed following the standard protocol during bolus administration of intravenous contrast.  CONTRAST:  155mL ISOVUE-300 IOPAMIDOL (ISOVUE-300) INJECTION 61%  COMPARISON:  04/12/2018 CT chest, abdomen and pelvis.  FINDINGS: CT CHEST FINDINGS  Motion degraded scan limits assessment.  Cardiovascular: Normal heart size. No significant pericardial effusion/thickening. Right subclavian Port-A-Cath terminates in the right atrium. Atherosclerotic nonaneurysmal thoracic aorta. Normal caliber pulmonary arteries. No central pulmonary emboli.  Mediastinum/Nodes: Stable subcentimeter hypodense bilateral thyroid lobe nodules. Unremarkable esophagus. No pathologically enlarged axillary, mediastinal or hilar lymph nodes.  Lungs/Pleura: No pneumothorax. No pleural effusion. Scattered small solid pulmonary nodules in the left lower lobe, largest 3 mm (series 3/image 47), stable. No acute consolidative airspace disease,  lung masses or appreciable new pulmonary nodules. Stable mild radiation fibrosis in the anterior apical left upper lobe.  Musculoskeletal: Stable faintly sclerotic left manubrial lesion. No new focal osseous lesions. Mild thoracic spondylosis.  CT ABDOMEN PELVIS FINDINGS  Hepatobiliary: Normal liver with no liver mass. Normal gallbladder with no radiopaque cholelithiasis. No biliary ductal dilatation.  Pancreas: Normal, with no mass or duct dilation.  Spleen: Normal size. No mass.  Adrenals/Urinary Tract: Normal adrenals. No hydronephrosis. Stable subcentimeter hypodense renal cortical lesions in posterior right kidney, too small to characterize. No new renal lesions. Bladder obscured by streak artifact from right hip hardware. Small amount of gas in the nondependent bladder lumen.  Stomach/Bowel: Normal non-distended stomach. Normal caliber small bowel with no small bowel wall thickening. Appendectomy. Normal large bowel with no diverticulosis, large bowel wall thickening or pericolonic fat stranding.  Vascular/Lymphatic: Atherosclerotic nonaneurysmal abdominal aorta. Patent portal, splenic, hepatic and renal veins. No pathologically enlarged lymph nodes in the abdomen or pelvis.  Reproductive: Stable mildly enlarged myomatous uterus with coarsely calcified fibroids. No adnexal masses.  Other: No pneumoperitoneum, ascites or focal fluid collection.  Musculoskeletal: No aggressive appearing focal osseous lesions. Right total hip arthroplasty. Marked lumbar spondylosis.  IMPRESSION: 1. Stable faintly sclerotic left manubrial lesion. No evidence of new or progressive metastatic disease, noting limited evaluation of the lungs due to motion degradation. 2.  Aortic Atherosclerosis (ICD10-I70.0).  Electronically Signed: By: Ilona Sorrel M.D. On: 09/13/2018 10:17

## 2018-09-15 ENCOUNTER — Inpatient Hospital Stay: Payer: Medicare Other | Attending: Internal Medicine

## 2018-09-15 ENCOUNTER — Other Ambulatory Visit: Payer: Self-pay

## 2018-09-15 ENCOUNTER — Inpatient Hospital Stay (HOSPITAL_BASED_OUTPATIENT_CLINIC_OR_DEPARTMENT_OTHER): Payer: Medicare Other | Admitting: Internal Medicine

## 2018-09-15 ENCOUNTER — Encounter: Payer: Self-pay | Admitting: Internal Medicine

## 2018-09-15 VITALS — BP 126/79 | HR 84 | Temp 98.4°F | Resp 18 | Ht 63.0 in | Wt 167.0 lb

## 2018-09-15 DIAGNOSIS — C50812 Malignant neoplasm of overlapping sites of left female breast: Secondary | ICD-10-CM

## 2018-09-15 DIAGNOSIS — Z171 Estrogen receptor negative status [ER-]: Secondary | ICD-10-CM | POA: Diagnosis not present

## 2018-09-15 DIAGNOSIS — N3090 Cystitis, unspecified without hematuria: Secondary | ICD-10-CM

## 2018-09-15 LAB — CBC WITH DIFFERENTIAL/PLATELET
Abs Immature Granulocytes: 0.02 10*3/uL (ref 0.00–0.07)
Basophils Absolute: 0 10*3/uL (ref 0.0–0.1)
Basophils Relative: 0 %
Eosinophils Absolute: 0.2 10*3/uL (ref 0.0–0.5)
Eosinophils Relative: 4 %
HCT: 44.1 % (ref 36.0–46.0)
Hemoglobin: 14.3 g/dL (ref 12.0–15.0)
Immature Granulocytes: 0 %
Lymphocytes Relative: 20 %
Lymphs Abs: 0.9 10*3/uL (ref 0.7–4.0)
MCH: 30.3 pg (ref 26.0–34.0)
MCHC: 32.4 g/dL (ref 30.0–36.0)
MCV: 93.4 fL (ref 80.0–100.0)
Monocytes Absolute: 0.3 10*3/uL (ref 0.1–1.0)
Monocytes Relative: 6 %
Neutro Abs: 3.3 10*3/uL (ref 1.7–7.7)
Neutrophils Relative %: 70 %
Platelets: 302 10*3/uL (ref 150–400)
RBC: 4.72 MIL/uL (ref 3.87–5.11)
RDW: 13.1 % (ref 11.5–15.5)
WBC: 4.8 10*3/uL (ref 4.0–10.5)
nRBC: 0 % (ref 0.0–0.2)

## 2018-09-15 LAB — COMPREHENSIVE METABOLIC PANEL
ALBUMIN: 4.1 g/dL (ref 3.5–5.0)
ALT: 21 U/L (ref 0–44)
AST: 27 U/L (ref 15–41)
Alkaline Phosphatase: 58 U/L (ref 38–126)
Anion gap: 10 (ref 5–15)
BUN: 17 mg/dL (ref 8–23)
CO2: 25 mmol/L (ref 22–32)
CREATININE: 0.93 mg/dL (ref 0.44–1.00)
Calcium: 8.8 mg/dL — ABNORMAL LOW (ref 8.9–10.3)
Chloride: 103 mmol/L (ref 98–111)
GFR calc Af Amer: >60 mL/min (ref >60)
GFR calc non Af Amer: 60 mL/min — ABNORMAL LOW (ref >60)
Glucose, Bld: 197 mg/dL — ABNORMAL HIGH (ref 70–99)
Potassium: 3.9 mmol/L (ref 3.5–5.1)
Sodium: 138 mmol/L (ref 135–145)
Total Bilirubin: 0.7 mg/dL (ref 0.3–1.2)
Total Protein: 7 g/dL (ref 6.5–8.1)

## 2018-09-15 LAB — URINALYSIS, COMPLETE (UACMP) WITH MICROSCOPIC
BACTERIA UA: NONE SEEN
Bilirubin Urine: NEGATIVE
Glucose, UA: NEGATIVE mg/dL
Hgb urine dipstick: NEGATIVE
Ketones, ur: NEGATIVE mg/dL
Leukocytes,Ua: NEGATIVE
Nitrite: NEGATIVE
Protein, ur: NEGATIVE mg/dL
Specific Gravity, Urine: 1.014 (ref 1.005–1.030)
pH: 6 (ref 5.0–8.0)

## 2018-09-15 MED ORDER — HEPARIN SOD (PORK) LOCK FLUSH 100 UNIT/ML IV SOLN
500.0000 [IU] | Freq: Once | INTRAVENOUS | Status: AC
  Administered 2018-09-15: 500 [IU] via INTRAVENOUS
  Filled 2018-09-15: qty 5

## 2018-09-15 MED ORDER — SODIUM CHLORIDE 0.9% FLUSH
10.0000 mL | Freq: Once | INTRAVENOUS | Status: DC
  Filled 2018-09-15: qty 10

## 2018-09-15 NOTE — Assessment & Plan Note (Addendum)
#  Metastatic breast cancer ER [name] HER-2/neu positive.  February 2020 CT scan chest and pelvis NED/stable sclerosis of the left manubrium.  #Currently Herceptin Perjeta is on hold given the recent drop in ejection fraction/nonrecovery of ejection fraction.  #Given the absence of any obvious disease on the CT scan/comorbidities including fall dementia/drop in ejection fraction I think is reasonable to hold off further therapy at this time.  Monitor closely clinically.  #Drop in ejection fraction-baseline 68%; no significant improvement in ejection fraction from 52% [July 2020] asymptomatic.  Hold for now therapy for now.  #Chronic dementia/debility-stable.  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   # chronic hip/back  pain-currently stable.  #Left breast/incisional pain-recommend discussing regarding use of low-dose Neurontin with neurology.  # " Gas in the bladder"-incidental finding noted on the CT scan.  No infection UA negative.  No Foley catheters.  Recommend follow-up with Dr. Eliberto Ivory  DISPOSITION:  #  follow up in 1 month-MD labs/ cbc/cmp- no treatment- dr.B

## 2018-09-15 NOTE — Progress Notes (Signed)
Pt here and still having left axilla tenderness, also she had a seizure 2 weeks ago and fell and was uncoscious for 90 sec. And then hurt her ankle a little bit. There was no warning with the seizure

## 2018-09-15 NOTE — Progress Notes (Signed)
[place] OFFICE PROGRESS NOTE  Patient Care Team: [name], [name], MD (General Surgery) Requested, [name]   Oncology History   # June 2016- LEFT BREAST CA;  [name] ; [name] ; [name]; Her 2 Neu POS; [place] from July OF 2016; [name]; adjuvant Herceptin [ Finished July 2017]; AUG 2017- Neratinib x5 days; DISCON sec to diarrhea  # MID OCT 2018- Right breast mass-Bx- ER/PR-NEG; Her 2 NEU POSITIVE 1~2.5cm;  [?NEW primary]  # MID-OCT 2018-METASTATIC RECURRENT-oh sternal mass; Left Ax LN [Bx]/periportal LN  # OCT 25th 2018- TAXOL-HERCEPTIN-PERJETA; Jan 2019- CT PR; continue HP only; on HOLD since NOV 2019--sec to drop in EF/Sieziues   -------------------------------------------------------------------  # MUGA scan- July 28th 2017- 67%.  December 2019-EF 53%; January 2020 EF-52%  # chronic gait/balance issues  # Seizures/petit-mal/ Dr.Shah-Keppra [name] [id number]]  # June 2017- left breast Bx- fat necrosis [[name]  # july 2017-  BRCA 1& 2- NEG.   MOLECULAR TESTING- F ONE- TPS- 0%;  ERB2 amplification; PI3K/RET amplification Others**  --------------------------------------------------    DIAGNOSIS: [ OCT 2018]- REC/MET- BREAST CA ER/PR-NEG; her 2 POS  STAGE: 4  ;GOALS: Palliative  CURRENT/MOST RECENT THERAPY-chemo holiday      Carcinoma of overlapping sites of left breast in female, estrogen receptor negative ([place])      INTERVAL HISTORY:  [name] 78 y.o.  female pleasant patient above history of metastatic breast cancer on Herceptin plus Perjeta is here for follow-up/review the results CT scan.  In the interim as per the husband patient had significant decline in her hearing.  She is recommended a cochlear implant.  Also as per the family patient probably had  a pretty much seizure.  She is currently on Keppra thousand milligrams twice daily as per neurology.  Continues to have chronic back pain.  Also complains of chronic sharp pain in the left axilla/left breast.   Review of Systems  Constitutional: Negative for chills, diaphoresis, fever, malaise/fatigue and weight loss.  HENT: Positive for hearing loss (Left-sided.). Negative for nosebleeds and sore throat.   Eyes: Negative for double vision.  Respiratory: Negative for cough, hemoptysis, sputum production, shortness of breath and wheezing.   Cardiovascular: Negative for chest pain, palpitations, orthopnea and leg swelling.  Gastrointestinal: Negative for abdominal pain, blood in stool, constipation, diarrhea, heartburn, melena, nausea and vomiting.  Genitourinary: Negative for dysuria, frequency and urgency.  Musculoskeletal: Positive for back pain and joint pain.  Skin: Negative.  Negative for itching and rash.  Neurological: Negative for dizziness, tingling, focal weakness, weakness and headaches.       .  Mild seizure.  Endo/Heme/Allergies: Does not bruise/bleed easily.  Psychiatric/Behavioral: Positive for memory loss. Negative for depression. The patient is not nervous/anxious and does not have insomnia.       PAST MEDICAL HISTORY :  Past Medical History:  Diagnosis Date  . Arthritis   . Brain tumor ([place]) 1995   meningeoma  . Breast cancer ([place]) 2018   left breast; surgery 01/11/15 with Dr. Bary Castilla; path with invasive mammary and DCIS  . Breast cancer of upper-inner quadrant of left female breast ([name]) 12/15/14   Completed radiation end of December and finished chemotherapy 2 weeks ago, Left breast invasive mammary carcinoma, T1cN39mc (1.5 cm); Grade 3, [name], PR  negative, [name], .  Marland Kitchen Cataract    bilat   . DDD (degenerative disc disease), lumbar    Lumbar, previously evaluated by Dr. Earnestine Leys  . Dementia ([place])   . Fibrocystic breast  disease    prior biopsy  . Glaucoma   . 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 Hearing loss   . History of cancer chemotherapy   . History of radiation therapy   . Hyperlipidemia, unspecified   . Imbalance   . Memory impairment    seen by Dr Manuella Ghazi  possible post crainiotomy from radiation  . 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  . Numbness and tingling    right hand   . Osteoarthritis   . Osteoporosis, post-menopausal   . Personal history of chemotherapy   . Personal history of radiation therapy   . Shoulder pain, right   . Wears glasses     PAST SURGICAL HISTORY :   Past Surgical History:  Procedure Laterality Date  . APPENDECTOMY  1950  . BRAIN SURGERY  1995   left frontal/temporal  . BREAST BIOPSY Left 1997  . BREAST BIOPSY Left 12/15/14   confirmed DCIS  . BREAST BIOPSY Left 01/08/2015   Procedure: BREAST BIOPSY WITH NEEDLE LOCALIZATION;  Surgeon: [name], MD;  Location: [facility] ORS;  Service: General;  Laterality: Left;  . BREAST LUMPECTOMY Left 01/08/2015   Procedure: LUMPECTOMY;  Surgeon: [name], MD;  Location: [facility] ORS;  Service: General;  Laterality: Left;  . COLONOSCOPY  2010   Dr. [name]  . ORIF ANKLE FRACTURE Right 08/05/2017   Procedure: OPEN REDUCTION INTERNAL FIXATION (ORIF) ANKLE FRACTURE;  Surgeon: Earnestine Leys, MD;  Location: [facility] OR[facility];  Service: Orthopedics;  Laterality: Right;  . PORTACATH PLACEMENT Right 01/16/2015   Procedure: INSERTION PORT-A-CATH;  Surgeon: [name], MD;  Location: [facility] OR[facility];  Service: General;  Laterality: Right;  . SENTINEL NODE BIOPSY Left 01/16/2015   Procedure: SENTINEL NODE BIOPSY;  Surgeon: [name], MD;  Location: [facility] OR[facility];  Service: General;  Laterality: Left;  . TOTAL HIP ARTHROPLASTY Right 09/04/2015   Procedure: RIGHT TOTAL HIP ARTHROPLASTY ANTERIOR APPROACH;  Surgeon: [name], MD;  Location: [facility];   Service: Orthopedics;  Laterality: Right;    FAMILY HISTORY :   Family History  Problem Relation Age of Onset  . Breast cancer Sister 62  . Addison's disease Mother   . Hyperthyroidism Mother   . Osteoarthritis Mother   . Stroke Father   . Heart disease Father   . High blood pressure Father     SOCIAL HISTORY:   Social History   Tobacco Use  . Smoking status: Former Smoker    Packs/day: 0.25    Years: 5.00    Pack years: 1.25    Types: Cigarettes    Last attempt to quit: 07/28/1972    Years since quitting: 46.1  . Smokeless tobacco: Never Used  Substance Use Topics  . Alcohol use: Yes    Alcohol/week: 1.0 - 2.0 standard drinks    Types: 1 - 2 Glasses of wine per week    Comment: 1 Glass Wine / Night  . Drug use: No    ALLERGIES:  is allergic to donepezil.  MEDICATIONS:  Current Outpatient Medications  Medication Sig Dispense Refill  . acetaminophen (TYLENOL) 325 MG tablet Take 650 mg by mouth every 4 (  four) hours as needed. Pain / increased temp.    . Calcium Carbonate-Vitamin D (CALTRATE 600+D PO) Take 1 tablet by mouth daily.    . celecoxib (CELEBREX) 200 MG capsule Take 200 mg by mouth daily.     . Cetirizine HCl (ZYRTEC ALLERGY PO) Take 1 tablet by mouth as needed (allergies).    . Cholecalciferol (VITAMIN D-3) 5000 units TABS Take 1 tablet by mouth daily.    Marland Kitchen levETIRAcetam (KEPPRA) 250 MG tablet 1 tablet daily at bedtime  5  . naproxen sodium (ALEVE) 220 MG tablet Take 220 mg by mouth daily as needed (pain).    . prochlorperazine (COMPAZINE) 10 MG tablet Take 1 tablet (10 mg total) every 6 (six) hours as needed by mouth for nausea or vomiting. (Patient not taking: Reported on 07/07/2018) 40 tablet 1  . traMADol (ULTRAM) 50 MG tablet Take 1 tablet (50 mg total) by mouth 2 (two) times daily as needed for moderate pain. 60 tablet 0  . vitamin C (ASCORBIC ACID) 250 MG tablet Take 250 mg by mouth daily.    . vitamin E 400 UNIT capsule Take 400 Units by mouth daily.       No current facility-administered medications for this visit.    Facility-Administered Medications Ordered in Other Visits  Medication Dose Route Frequency Provider Last Rate Last Dose  . sodium chloride flush (NS) 0.9 % injection 10 mL  10 mL Intravenous PRN Cammie Sickle, MD   10 mL at 07/07/18 0830    PHYSICAL EXAMINATION: ECOG PERFORMANCE STATUS: 1 - Symptomatic but completely ambulatory  BP 126/79   Pulse 84   Temp 98.4 F (36.9 C) (Oral)   Resp 18   Ht _0  (1.6 m)   Wt 167 lb (75.8 kg)   BMI 29.58 kg/m   Filed Weights   09/15/18 0958  Weight: 167 lb (75.8 kg)    Physical Exam  Constitutional: She is oriented to person, place, and time and well-developed, well-nourished, and in no distress.  Accompanied by daughter/husband.   HENT:  Head: Normocephalic and atraumatic.  Mouth/Throat: Oropharynx is clear and moist. No oropharyngeal exudate.  Eyes: Pupils are equal, round, and reactive to light.  Neck: Normal range of motion. Neck supple.  Cardiovascular: Normal rate and regular rhythm.  Pulmonary/Chest: No respiratory distress. She has no wheezes.  Abdominal: Soft. Bowel sounds are normal. She exhibits no distension and no mass. There is no abdominal tenderness. There is no rebound and no guarding.  Musculoskeletal: Normal range of motion.        General: No tenderness or edema.  Neurological: She is alert and oriented to person, place, and time.  Skin: Skin is warm.  Psychiatric: Affect normal.       LABORATORY DATA:  I have reviewed the data as listed    Component Value Date/Time   NA 138 09/15/2018 0915   K 3.9 09/15/2018 0915   CL 103 09/15/2018 0915   CO2 25 09/15/2018 0915   GLUCOSE 197 (H) 09/15/2018 0915   BUN 17 09/15/2018 0915   CREATININE 0.93 09/15/2018 0915   CALCIUM 8.8 (L) 09/15/2018 0915   PROT 7.0 09/15/2018 0915   ALBUMIN 4.1 09/15/2018 0915   AST 27 09/15/2018 0915   ALT 21 09/15/2018 0915   ALKPHOS 58 09/15/2018 0915    BILITOT 0.7 09/15/2018 0915   GFRNONAA 60 (L) 09/15/2018 0915   GFRAA >60 09/15/2018 0915    No results found for: SPEP, UPEP  Lab  Results  Component Value Date   WBC 4.8 09/15/2018   NEUTROABS 3.3 09/15/2018   HGB 14.3 09/15/2018   HCT 44.1 09/15/2018   MCV 93.4 09/15/2018   PLT 302 09/15/2018      Chemistry      Component Value Date/Time   NA 138 09/15/2018 0915   K 3.9 09/15/2018 0915   CL 103 09/15/2018 0915   CO2 25 09/15/2018 0915   BUN 17 09/15/2018 0915   CREATININE 0.93 09/15/2018 0915      Component Value Date/Time   CALCIUM 8.8 (L) 09/15/2018 0915   ALKPHOS 58 09/15/2018 0915   AST 27 09/15/2018 0915   ALT 21 09/15/2018 0915   BILITOT 0.7 09/15/2018 0915       RADIOGRAPHIC STUDIES: I have personally reviewed the radiological images as listed and agreed with the findings in the report. No results found.   ASSESSMENT & PLAN:  Carcinoma of overlapping sites of left breast in female, estrogen receptor negative ([place]) #Metastatic breast cancer ER [name] HER-2/neu positive.  February 2020 CT scan chest and pelvis NED/stable sclerosis of the left manubrium.  #Currently Herceptin Perjeta is on hold given the recent drop in ejection fraction/nonrecovery of ejection fraction.  #Given the absence of any obvious disease on the CT scan/comorbidities including fall dementia/drop in ejection fraction I think is reasonable to hold off further therapy at this time.  Monitor closely clinically.  #Drop in ejection fraction-baseline 68%; no significant improvement in ejection fraction from 52% [July 2020] asymptomatic.  Hold for now therapy for now.  #Chronic dementia/debility-stable.  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   # chronic hip/back  pain-currently stable.  #Left breast/incisional pain-recommend discussing regarding use of low-dose Neurontin with neurology.  # " Gas in the bladder"-incidental finding noted  on the CT scan.  No infection UA negative.  No Foley catheters.  Recommend follow-up with Dr. Eliberto Ivory  DISPOSITION:  #  follow up in 1 month-MD labs/ cbc/cmp- no treatment- dr.B   Orders Placed This Encounter  Procedures  . CBC with Differential    Standing Status:   Future    Standing Expiration Date:   09/16/2019  . Comprehensive metabolic panel    Standing Status:   Future    Standing Expiration Date:   09/16/2019   All questions were answered. The patient knows to call the clinic with any problems, questions or concerns.      Cammie Sickle, MD 09/15/2018 8:49 PM

## 2018-10-12 ENCOUNTER — Telehealth: Payer: Self-pay | Admitting: Internal Medicine

## 2018-10-12 NOTE — Telephone Encounter (Signed)
Patient's husband notified of cancellation of tomorrows appointment.   Colette to give patient next appointment.

## 2018-10-12 NOTE — Telephone Encounter (Signed)
Ok to push pt's follow up out to 1 month from now; no chmoe- labs/MD.  [name]

## 2018-10-13 ENCOUNTER — Inpatient Hospital Stay: Payer: Medicare Other | Admitting: Internal Medicine

## 2018-10-13 ENCOUNTER — Inpatient Hospital Stay: Payer: Medicare Other

## 2018-11-05 ENCOUNTER — Encounter: Payer: Self-pay | Admitting: *Deleted

## 2018-11-09 ENCOUNTER — Other Ambulatory Visit: Payer: Self-pay

## 2018-11-10 ENCOUNTER — Inpatient Hospital Stay: Payer: Medicare Other | Attending: Internal Medicine

## 2018-11-10 ENCOUNTER — Other Ambulatory Visit: Payer: Self-pay

## 2018-11-10 ENCOUNTER — Encounter: Payer: Self-pay | Admitting: Internal Medicine

## 2018-11-10 ENCOUNTER — Inpatient Hospital Stay (HOSPITAL_BASED_OUTPATIENT_CLINIC_OR_DEPARTMENT_OTHER): Payer: Medicare Other | Admitting: Internal Medicine

## 2018-11-10 DIAGNOSIS — G8929 Other chronic pain: Secondary | ICD-10-CM | POA: Diagnosis not present

## 2018-11-10 DIAGNOSIS — M25559 Pain in unspecified hip: Secondary | ICD-10-CM | POA: Diagnosis not present

## 2018-11-10 DIAGNOSIS — Z171 Estrogen receptor negative status [ER-]: Secondary | ICD-10-CM | POA: Insufficient documentation

## 2018-11-10 DIAGNOSIS — C50812 Malignant neoplasm of overlapping sites of left female breast: Secondary | ICD-10-CM | POA: Diagnosis not present

## 2018-11-10 DIAGNOSIS — M549 Dorsalgia, unspecified: Secondary | ICD-10-CM | POA: Diagnosis not present

## 2018-11-10 LAB — COMPREHENSIVE METABOLIC PANEL
ALT: 22 U/L (ref 0–44)
AST: 23 U/L (ref 15–41)
Albumin: 4.2 g/dL (ref 3.5–5.0)
Alkaline Phosphatase: 60 U/L (ref 38–126)
Anion gap: 7 (ref 5–15)
BUN: 17 mg/dL (ref 8–23)
CO2: 30 mmol/L (ref 22–32)
Calcium: 9 mg/dL (ref 8.9–10.3)
Chloride: 101 mmol/L (ref 98–111)
Creatinine, Ser: 0.89 mg/dL (ref 0.44–1.00)
GFR calc Af Amer: >60 mL/min (ref >60)
GFR calc non Af Amer: >60 mL/min (ref >60)
Glucose, Bld: 135 mg/dL — ABNORMAL HIGH (ref 70–99)
Potassium: 4.4 mmol/L (ref 3.5–5.1)
Sodium: 138 mmol/L (ref 135–145)
Total Bilirubin: 0.4 mg/dL (ref 0.3–1.2)
Total Protein: 7 g/dL (ref 6.5–8.1)

## 2018-11-10 LAB — CBC WITH DIFFERENTIAL/PLATELET
Abs Immature Granulocytes: 0.02 10*3/uL (ref 0.00–0.07)
Basophils Absolute: 0 10*3/uL (ref 0.0–0.1)
Basophils Relative: 1 %
Eosinophils Absolute: 0.2 10*3/uL (ref 0.0–0.5)
Eosinophils Relative: 4 %
HCT: 44.5 % (ref 36.0–46.0)
Hemoglobin: 14.1 g/dL (ref 12.0–15.0)
Immature Granulocytes: 0 %
Lymphocytes Relative: 19 %
Lymphs Abs: 1 10*3/uL (ref 0.7–4.0)
MCH: 29.9 pg (ref 26.0–34.0)
MCHC: 31.7 g/dL (ref 30.0–36.0)
MCV: 94.3 fL (ref 80.0–100.0)
Monocytes Absolute: 0.4 10*3/uL (ref 0.1–1.0)
Monocytes Relative: 7 %
Neutro Abs: 3.7 10*3/uL (ref 1.7–7.7)
Neutrophils Relative %: 69 %
Platelets: 284 10*3/uL (ref 150–400)
RBC: 4.72 MIL/uL (ref 3.87–5.11)
RDW: 13 % (ref 11.5–15.5)
WBC: 5.4 10*3/uL (ref 4.0–10.5)
nRBC: 0 % (ref 0.0–0.2)

## 2018-11-10 NOTE — Progress Notes (Signed)
Contacted patient's husband to initiate the virtual visit for today. Medication reconciliation performed with patient's husband. Pt's husband stated that patient has been a little more drowsy than usual. He contributes this to the keppra.

## 2018-11-10 NOTE — Progress Notes (Signed)
I connected with [name] on 11/11/18 at  9:30 AM EDT by video enabled telemedicine visit and verified that I am speaking with the correct person using two identifiers.  I discussed the limitations, risks, security and privacy concerns of performing an evaluation and management service by telemedicine and the availability of in-person appointments. I also discussed with the patient that there may be a patient responsible charge related to this service. The patient expressed understanding and agreed to proceed.    Other persons participating in the visit and their role in the encounter: Husband Patient's location: Home Provider's location: Office    Oncology History   # June 2016- LEFT BREAST CA;  [name] ; [name] ; [name]; Her 2 Neu POS; [place] from July OF 2016; [name]; adjuvant Herceptin [ Finished July 2017]; AUG 2017- Neratinib x5 days; DISCON sec to diarrhea  # MID OCT 2018- Right breast mass-Bx- ER/PR-NEG; Her 2 NEU POSITIVE 1~2.5cm;  [?NEW primary]  # MID-OCT 2018-METASTATIC RECURRENT-oh sternal mass; Left Ax LN [Bx]/periportal LN  # OCT 25th 2018- TAXOL-HERCEPTIN-PERJETA; Jan 2019- CT PR; continue HP only; on HOLD since NOV 2019--sec to drop in EF/Sieziues   -------------------------------------------------------------------  # MUGA scan- July 28th 2017- 67%.  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   MOLECULAR TESTING- F ONE- TPS- 0%;  ERB2 amplification; PI3K/RET amplification Others**  --------------------------------------------------    DIAGNOSIS: [ OCT 2018]- REC/MET- BREAST CA ER/PR-NEG; her 2 POS  STAGE: 4  ;GOALS: Palliative  CURRENT/MOST RECENT THERAPY-chemo holiday      Carcinoma of overlapping sites of left breast in female,  estrogen receptor negative ([place])     Chief Complaint: Breast cancer   History of present illness:[name] 78 y.o.  female with history of metastatic ER [name] HER-2/neu positive breast cancer currently on chemotherapy holiday.  Patient continues to have chronic low back pain.  Not any worse.  Continues her chronic hip pain not any worse.  Continues have difficulty hearing-awaiting evaluation at [facility] next month.  Patient does not have any swelling in the legs.  Denies any shortness of breath cough.  No new lumps or bumps.  Observation/objective: Hemoglobin 14 normal renal function.  Assessment and plan: Carcinoma of overlapping sites of left breast in female, estrogen receptor negative ([place]) #Metastatic breast cancer ER [name] HER-2/neu positive.  February 2020- CT scan chest and pelvis NED/stable sclerosis of the left manubrium.  #Currently Herceptin Perjeta is on hold given the recent drop in ejection fraction/nonrecovery of ejection fraction.  #Given the absence of metastatic disease noted on the recent CT scan in February/and given the coronavirus pandemic I think is reasonable to hold off systemic therapy at this time.  #Drop in ejection fraction-baseline 68%; no significant improvement in ejection fraction from 52%[July 2020]; clinically asymptomatic.  Recommend monitoring closely.  We will plan to get a repeat evaluation of ejection fraction if considering treatment again.  #Chronic dementia/debility-stable.  # left ear hearing loss-? Neuro-vascular HBO-stable teen cochlear implant at [facility].  #Chronic back and hip pain stable.  #Left breast/incisional pain-recommend discussing regarding use of low-dose Neurontin with neurology.   DISPOSITION:  # follow up VIDEO visit- in 2 months; port flush/lab-cbc/cmp/CT chest/ab/pelvis 1-2 days prior to MD visit.   Follow-up instructions:  I  discussed the assessment and treatment plan with the patient.  The patient was  provided an opportunity to ask questions and all were answered.  The patient agreed with the plan and demonstrated understanding of instructions.  The patient was advised to call back or seek an in person evaluation if the symptoms worsen or if the condition fails to improve as anticipated.    Dr. Charlaine Dalton [place] at [facility] 11/11/2018 2:48 PM

## 2018-11-10 NOTE — Assessment & Plan Note (Addendum)
#  Metastatic breast cancer ER [name] HER-2/neu positive.  February 2020- CT scan chest and pelvis NED/stable sclerosis of the left manubrium.  #Currently Herceptin Perjeta is on hold given the recent drop in ejection fraction/nonrecovery of ejection fraction.  #Given the absence of metastatic disease noted on the recent CT scan in February/and given the coronavirus pandemic I think is reasonable to hold off systemic therapy at this time.  #Drop in ejection fraction-baseline 68%; no significant improvement in ejection fraction from 52%[July 2020]; clinically asymptomatic.  Recommend monitoring closely.  We will plan to get a repeat evaluation of ejection fraction if considering treatment again.  #Chronic dementia/debility-stable.  # left ear hearing loss-? Neuro-vascular HBO-stable teen cochlear implant at [place].  #Chronic back and hip pain stable.  #Left breast/incisional pain-recommend discussing regarding use of low-dose Neurontin with neurology.   DISPOSITION:  # follow up VIDEO visit- in 2 months; port flush/lab-cbc/cmp/CT chest/ab/pelvis 1-2 days prior to MD visit.

## 2018-11-24 IMAGING — MG MM DIGITAL DIAGNOSTIC BILAT W/ TOMO W/ CAD
8 of 12 series · 8 of 32 positions shown · non-contrast
Comparison: Previous exam(s).

CLINICAL DATA: Focal pain in the far posterior aspect of the
upper-outer left breast in the inferior axillary region for the past
2 weeks. Status post left lumpectomy, radiation therapy and
chemotherapy for breast cancer in 0752. Medially located porta
catheter on the right.

EXAM:
DIGITAL DIAGNOSTIC BILATERAL MAMMOGRAM WITH CAD AND TOMO
ULTRASOUND LEFT BREAST

[L MLO (1 of 2)]
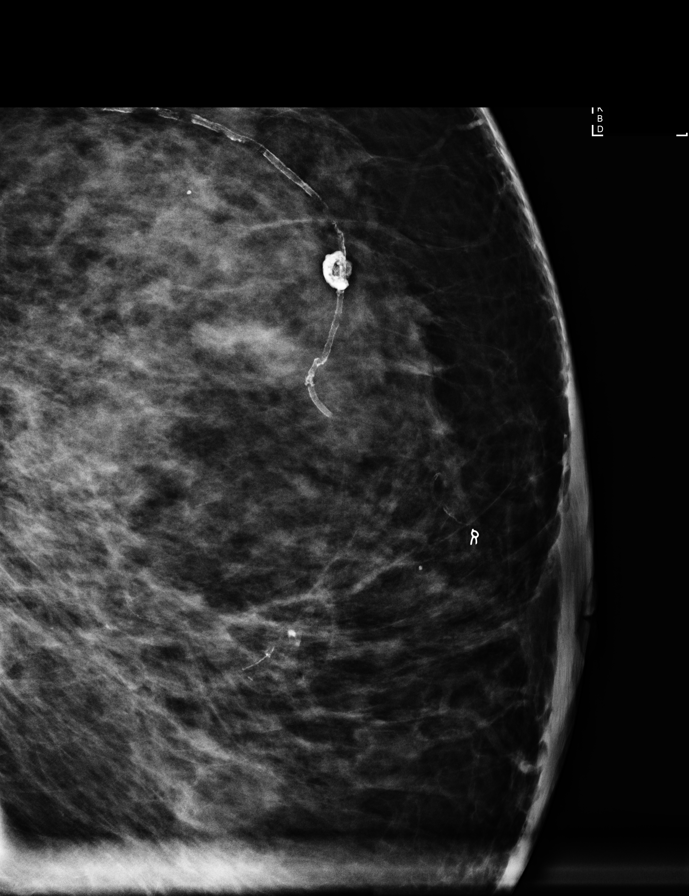

[L MLO (2 of 2)]
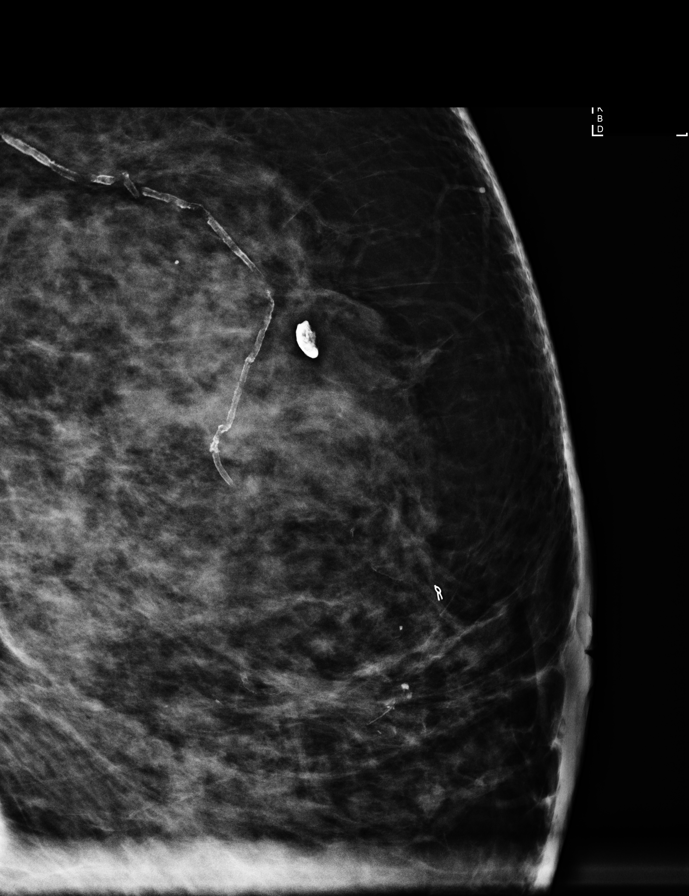

[R CC synth-2D]
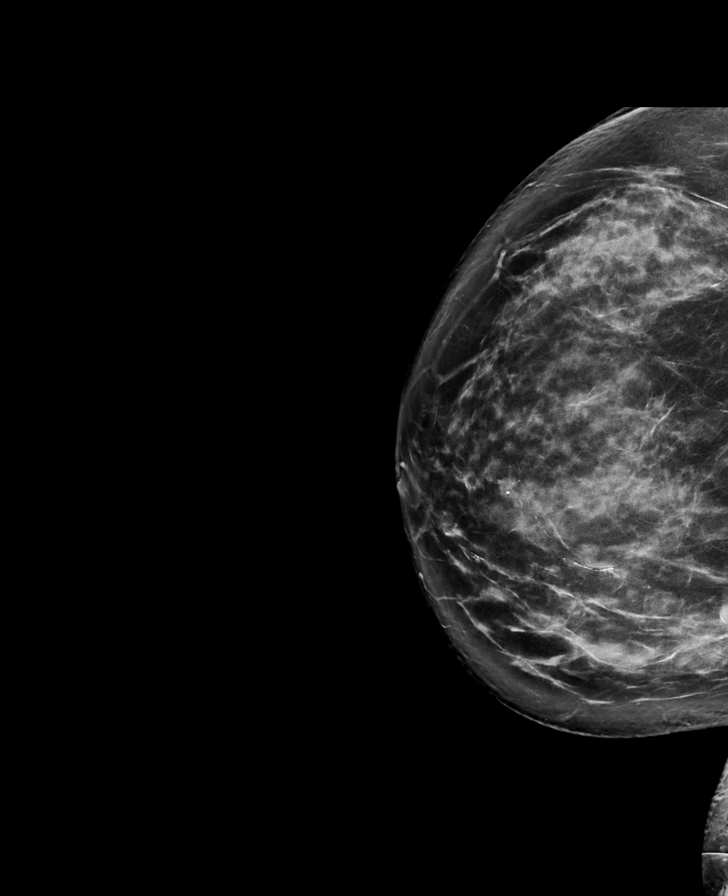

[L MLO synth-2D]
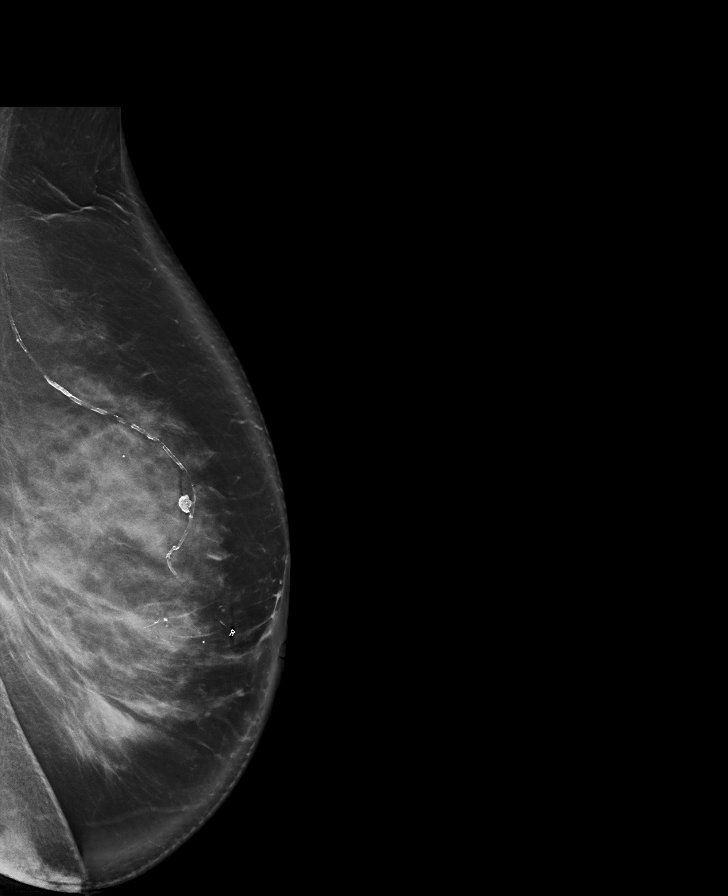

[R MLO synth-2D]
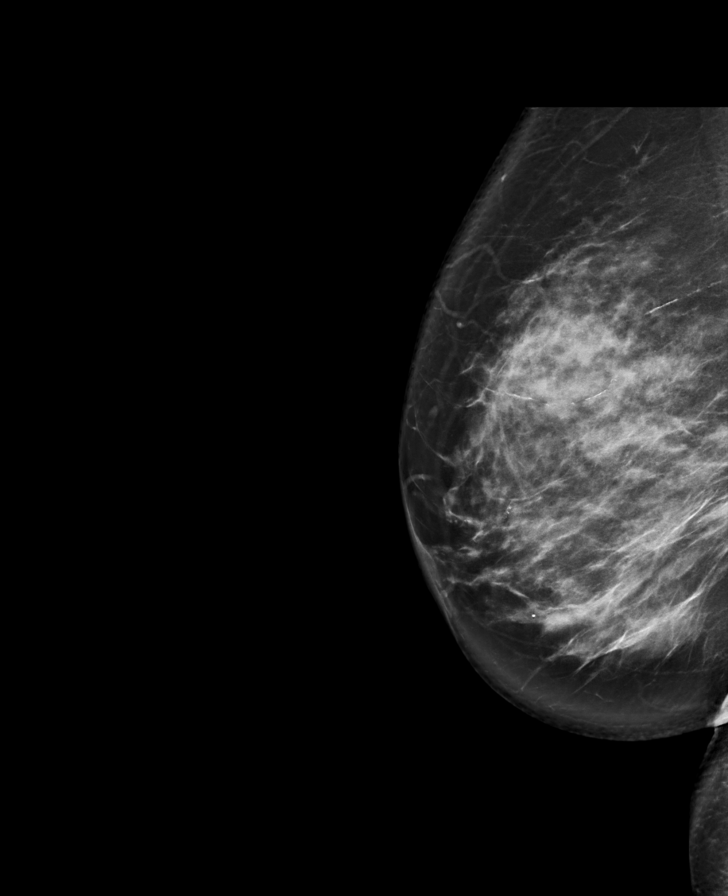

[L CC synth-2D]
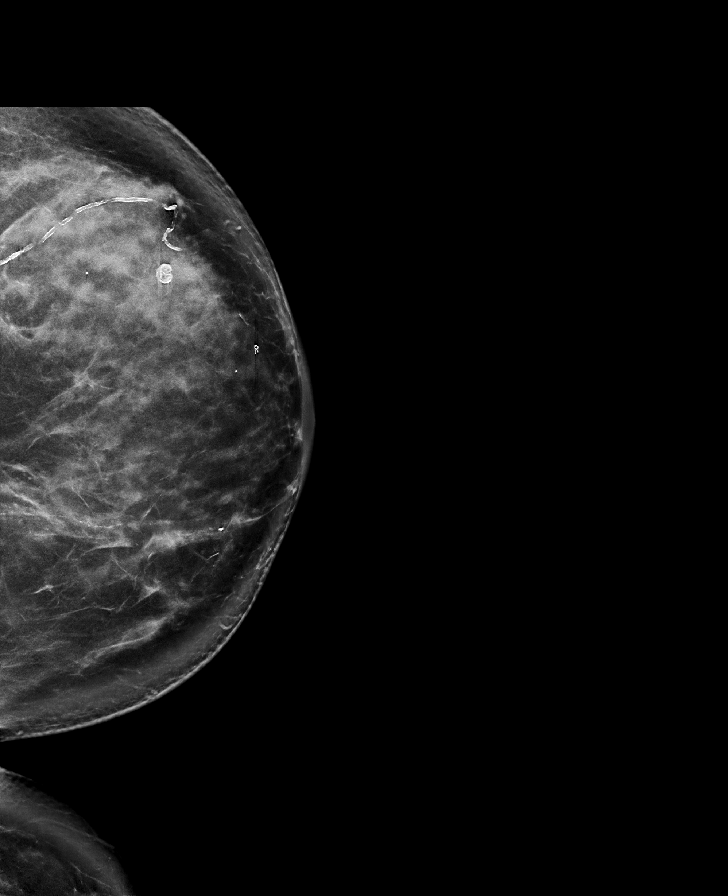

[L XCCL synth-2D]
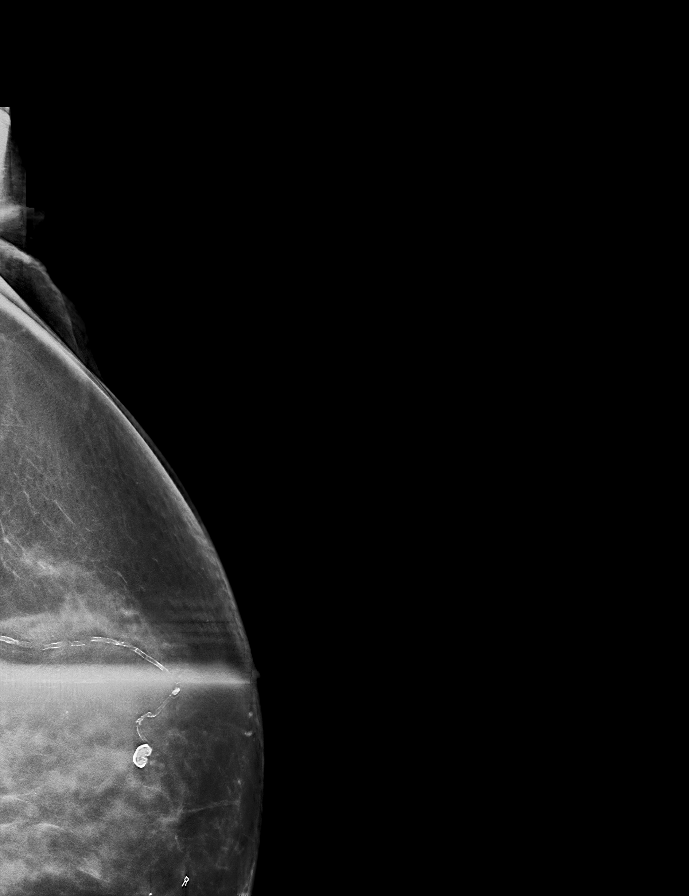

[R MLO tomo · tomo slice 51/102.0]
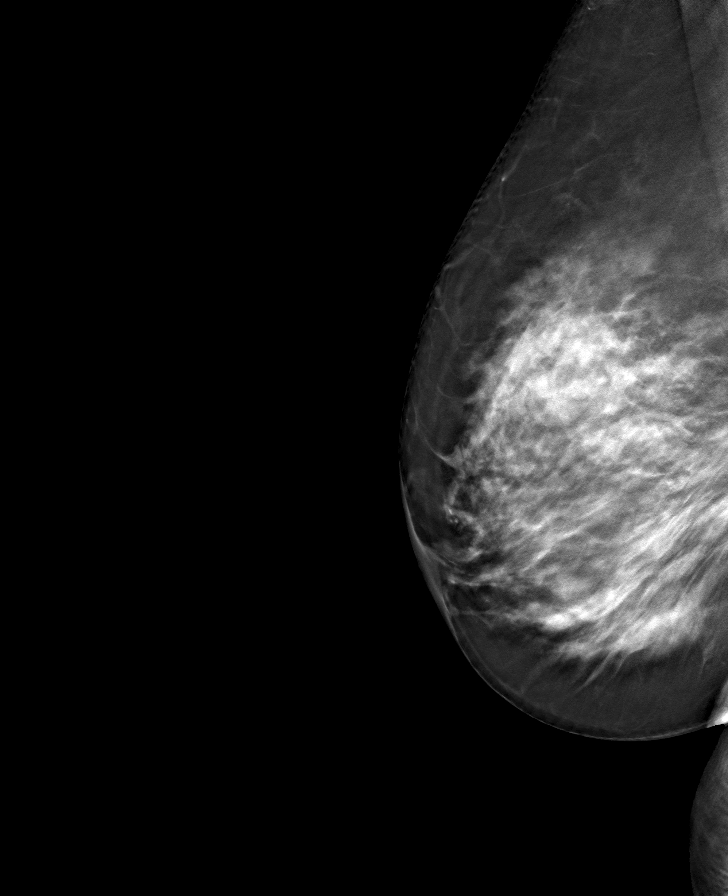

[8 of 32 positions shown; findings below may reference images not displayed]

ACR Breast Density Category c: The breast tissue is heterogeneously
dense, which may obscure small masses.
FINDINGS: Stable post lumpectomy changes on the left with associated changes
of fat necrosis. No interval findings suspicious for malignancy in
either breast.

Mammographic images were processed with CAD.

On physical exam, the patient is markedly tender to palpation in the
inferior axillary region on the left at the location of focal pain
reported by the patient. There is no palpable mass.

Targeted ultrasound is performed, showing normal appearing
subcutaneous fat and underlying muscle at the location of focal pain
and tenderness reported by the patient in the inferior left axilla.
No mass or other findings suspicious for malignancy were seen.
IMPRESSION: No evidence of malignancy.

RECOMMENDATION:
Bilateral diagnostic mammogram in 1 year.

I have discussed the findings and recommendations with the patient.
Results were also provided in writing at the conclusion of the
visit. If applicable, a reminder letter will be sent to the patient
regarding the next appointment.

BI-RADS CATEGORY  2: Benign.

## 2018-12-05 ENCOUNTER — Encounter: Payer: Self-pay | Admitting: Internal Medicine

## 2018-12-08 NOTE — Telephone Encounter (Signed)
Spoke to patient's husband at length regarding-plans for cochlear implant at [facility] in the context of COVID pandemic.  I did recommend to hold off procedure at this time as this is elective and not an emergency.  Follow-up as planned

## 2019-01-10 ENCOUNTER — Other Ambulatory Visit: Payer: Self-pay

## 2019-01-10 ENCOUNTER — Ambulatory Visit
Admission: RE | Admit: 2019-01-10 | Discharge: 2019-01-10 | Disposition: A | Discharge status: Home / Self Care | Payer: Medicare Other | Source: Ambulatory Visit | Attending: Internal Medicine | Admitting: Internal Medicine

## 2019-01-10 ENCOUNTER — Inpatient Hospital Stay: Payer: Medicare Other | Attending: Internal Medicine | Admitting: *Deleted

## 2019-01-10 DIAGNOSIS — C50812 Malignant neoplasm of overlapping sites of left female breast: Secondary | ICD-10-CM | POA: Insufficient documentation

## 2019-01-10 DIAGNOSIS — Z171 Estrogen receptor negative status [ER-]: Secondary | ICD-10-CM

## 2019-01-10 DIAGNOSIS — C50912 Malignant neoplasm of unspecified site of left female breast: Secondary | ICD-10-CM

## 2019-01-10 DIAGNOSIS — C773 Secondary and unspecified malignant neoplasm of axilla and upper limb lymph nodes: Secondary | ICD-10-CM

## 2019-01-10 LAB — COMPREHENSIVE METABOLIC PANEL
ALT: 20 U/L (ref 0–44)
AST: 23 U/L (ref 15–41)
Albumin: 4.2 g/dL (ref 3.5–5.0)
Alkaline Phosphatase: 55 U/L (ref 38–126)
Anion gap: 8 (ref 5–15)
BUN: 12 mg/dL (ref 8–23)
CO2: 28 mmol/L (ref 22–32)
Calcium: 8.6 mg/dL — ABNORMAL LOW (ref 8.9–10.3)
Chloride: 100 mmol/L (ref 98–111)
Creatinine, Ser: 0.78 mg/dL (ref 0.44–1.00)
GFR calc Af Amer: >60 mL/min (ref >60)
GFR calc non Af Amer: >60 mL/min (ref >60)
Glucose, Bld: 109 mg/dL — ABNORMAL HIGH (ref 70–99)
Potassium: 4.1 mmol/L (ref 3.5–5.1)
Sodium: 136 mmol/L (ref 135–145)
Total Bilirubin: 0.5 mg/dL (ref 0.3–1.2)
Total Protein: 6.9 g/dL (ref 6.5–8.1)

## 2019-01-10 LAB — CBC WITH DIFFERENTIAL/PLATELET
Abs Immature Granulocytes: 0.01 10*3/uL (ref 0.00–0.07)
Basophils Absolute: 0 10*3/uL (ref 0.0–0.1)
Basophils Relative: 0 %
Eosinophils Absolute: 0.2 10*3/uL (ref 0.0–0.5)
Eosinophils Relative: 3 %
HCT: 43.1 % (ref 36.0–46.0)
Hemoglobin: 13.9 g/dL (ref 12.0–15.0)
Immature Granulocytes: 0 %
Lymphocytes Relative: 22 %
Lymphs Abs: 1.2 10*3/uL (ref 0.7–4.0)
MCH: 30 pg (ref 26.0–34.0)
MCHC: 32.3 g/dL (ref 30.0–36.0)
MCV: 93.1 fL (ref 80.0–100.0)
Monocytes Absolute: 0.5 10*3/uL (ref 0.1–1.0)
Monocytes Relative: 9 %
Neutro Abs: 3.3 10*3/uL (ref 1.7–7.7)
Neutrophils Relative %: 66 %
Platelets: 279 10*3/uL (ref 150–400)
RBC: 4.63 MIL/uL (ref 3.87–5.11)
RDW: 13.3 % (ref 11.5–15.5)
WBC: 5.1 10*3/uL (ref 4.0–10.5)
nRBC: 0 % (ref 0.0–0.2)

## 2019-01-10 LAB — POCT I-STAT CREATININE: Creatinine, Ser: 0.8 mg/dL (ref 0.44–1.00)

## 2019-01-10 MED ORDER — SODIUM CHLORIDE 0.9% FLUSH
10.0000 mL | INTRAVENOUS | Status: DC | PRN
  Filled 2019-01-10: qty 10

## 2019-01-10 MED ORDER — HEPARIN SOD (PORK) LOCK FLUSH 100 UNIT/ML IV SOLN
500.0000 [IU] | Freq: Once | INTRAVENOUS | Status: AC
  Administered 2019-01-10: 500 [IU] via INTRAVENOUS

## 2019-01-10 MED ORDER — IOHEXOL 300 MG/ML  SOLN
85.0000 mL | Freq: Once | INTRAMUSCULAR | Status: AC | PRN
  Administered 2019-01-10: 85 mL via INTRAVENOUS

## 2019-01-12 ENCOUNTER — Other Ambulatory Visit: Payer: Self-pay

## 2019-01-12 ENCOUNTER — Inpatient Hospital Stay (HOSPITAL_BASED_OUTPATIENT_CLINIC_OR_DEPARTMENT_OTHER): Payer: Medicare Other | Admitting: Internal Medicine

## 2019-01-12 DIAGNOSIS — C50812 Malignant neoplasm of overlapping sites of left female breast: Secondary | ICD-10-CM

## 2019-01-12 DIAGNOSIS — Z5181 Encounter for therapeutic drug level monitoring: Secondary | ICD-10-CM

## 2019-01-12 DIAGNOSIS — Z79899 Other long term (current) drug therapy: Secondary | ICD-10-CM

## 2019-01-12 DIAGNOSIS — Z171 Estrogen receptor negative status [ER-]: Secondary | ICD-10-CM

## 2019-01-12 NOTE — Progress Notes (Signed)
I connected with [name] on 01/12/19 at  9:30 AM EDT by video enabled telemedicine visit and verified that I am speaking with the correct person using two identifiers.  I discussed the limitations, risks, security and privacy concerns of performing an evaluation and management service by telemedicine and the availability of in-person appointments. I also discussed with the patient that there may be a patient responsible charge related to this service. The patient expressed understanding and agreed to proceed.    Other persons participating in the visit and their role in the encounter: RN/ medical reconcilation; usband.  Patient's location: home  Provider's location: office   Oncology History Overview Note  # June 2016- LEFT BREAST CA;  [name] ; [name] ; [name]; Her 2 Neu POS; [place] from July OF 2016; [name]; adjuvant Herceptin [ Finished July 2017]; AUG 2017- Neratinib x5 days; DISCON sec to diarrhea  # MID OCT 2018- Right breast mass-Bx- ER/PR-NEG; Her 2 NEU POSITIVE 1~2.5cm;  [?NEW primary]  # MID-OCT 2018-METASTATIC RECURRENT-oh sternal mass; Left Ax LN [Bx]/periportal LN  # OCT 25th 2018- TAXOL-HERCEPTIN-PERJETA; Jan 2019- CT PR; continue HP only; on HOLD since NOV 2019--sec to drop in EF/Sieziues   -------------------------------------------------------------------  # MUGA scan- July 28th 2017- 67%.  December 2019-EF 53%; January 2020 EF-52%  # chronic gait/balance issues  # Seizures/petit-mal/ Dr.Shah-Keppra [name] [id number]]  # June 2017- left breast Bx- fat necrosis [[name]  # july 2017-  BRCA 1& 2- NEG.   MOLECULAR TESTING- F ONE- TPS- 0%;  ERB2 amplification; PI3K/RET amplification Others**  --------------------------------------------------    DIAGNOSIS: [ OCT 2018]- REC/MET- BREAST CA ER/PR-NEG; her 2 POS  STAGE: 4  ;GOALS: Palliative  CURRENT/MOST RECENT THERAPY-chemo holiday    Carcinoma of  overlapping sites of left breast in female, estrogen receptor negative ([place])     Chief Complaint: Breast cancer metastatic   History of present illness:[name] 78 y.o.  female with history of metastatic breast cancer ER [name] HER-2/neu positive currently on chemo holiday is here today with results of her CT scan.  Patient last chemotherapy was in November 2019.  As per the husband patient has been doing fairly well.  Continues to have achy/sharp pain in the left breast at site of prior surgeries.  Otherwise no lumps or bumps.  Appetite is good.  No weight loss.  no falls.  No headaches.  As per the husband to have postponed hyperbaric oxygen treatment for hearing loss because of cold pandemic.  Observation/objective: CT scan below  Assessment and plan: Carcinoma of overlapping sites of left breast in female, estrogen receptor negative ([name]) #Metastatic breast cancer ER [name] HER-2/neu positive.  June 2020- CT scan chest and pelvis NED/stable sclerosis of the left manubrium.  Stable last treatment Perjeta-herceptin on Nov 19th 2019.   #Currently patient's chemotherapy is on hold because of-covid pandemic/stable CT scans and drop in ejection fraction.  However discussed my concerns of disease recurrence given her history of metastatic disease given the current CT scan no obvious evidence of any progressive disease.  #Recommend restarting Herceptin plus minus Perjeta in approximately 4 weeks; repeat MUGA scan.  See discussion below.  #Drop in ejection fraction-baseline 68%; no significant improvement in ejection fraction from 52% [jan 2020]; will repeat again in a month.  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  #Chronic back and hip pain stable  #Left breast/incisional pain-chronic  stable.  DISPOSITION:  # MUGA scan in 2 weeks [will talk to husband] # Follow up in 4 weeks-MD;labs-cbc/cmp;  Herceptin-Perjeta- Dr.B   Follow-up instructions:  I discussed the assessment and treatment plan with the patient.  The patient was provided an opportunity to ask questions and all were answered.  The patient agreed with the plan and demonstrated understanding of instructions.  The patient was advised to call back or seek an in person evaluation if the symptoms worsen or if the condition fails to improve as anticipated.  Dr. Charlaine Dalton [place] at [facility] 01/12/2019 12:49 PM

## 2019-01-12 NOTE — Assessment & Plan Note (Addendum)
#  Metastatic breast cancer ER [name] HER-2/neu positive.  June 2020- CT scan chest and pelvis NED/stable sclerosis of the left manubrium.  Stable last treatment Perjeta-herceptin on Nov 19th 2019.   #Currently patient's chemotherapy is on hold because of-covid pandemic/stable CT scans and drop in ejection fraction.  However discussed my concerns of disease recurrence given her history of metastatic disease given the current CT scan no obvious evidence of any progressive disease.  #Recommend restarting Herceptin plus minus Perjeta in approximately 4 weeks; repeat MUGA scan.  See discussion below.  #Drop in ejection fraction-baseline 68%; no significant improvement in ejection fraction from 52% [jan 2020]; will repeat again in a month.  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  #Chronic back and hip pain stable  #Left breast/incisional pain-chronic stable.  DISPOSITION:  # MUGA scan in 2 weeks [will talk to husband] # Follow up in 4 weeks-MD;labs-cbc/cmp; Herceptin-Perjeta- Dr.B

## 2019-01-24 ENCOUNTER — Other Ambulatory Visit: Payer: Self-pay | Admitting: *Deleted

## 2019-01-24 DIAGNOSIS — C773 Secondary and unspecified malignant neoplasm of axilla and upper limb lymph nodes: Secondary | ICD-10-CM

## 2019-01-24 DIAGNOSIS — C50912 Malignant neoplasm of unspecified site of left female breast: Secondary | ICD-10-CM

## 2019-01-27 ENCOUNTER — Ambulatory Visit
Admission: RE | Admit: 2019-01-27 | Discharge: 2019-01-27 | Disposition: A | Discharge status: Home / Self Care | Payer: Medicare Other | Source: Ambulatory Visit | Attending: Internal Medicine | Admitting: Internal Medicine

## 2019-01-27 ENCOUNTER — Other Ambulatory Visit: Payer: Self-pay

## 2019-01-27 DIAGNOSIS — Z171 Estrogen receptor negative status [ER-]: Secondary | ICD-10-CM | POA: Diagnosis present

## 2019-01-27 DIAGNOSIS — Z79899 Other long term (current) drug therapy: Secondary | ICD-10-CM | POA: Diagnosis present

## 2019-01-27 DIAGNOSIS — Z5181 Encounter for therapeutic drug level monitoring: Secondary | ICD-10-CM | POA: Diagnosis present

## 2019-01-27 DIAGNOSIS — C50812 Malignant neoplasm of overlapping sites of left female breast: Secondary | ICD-10-CM | POA: Insufficient documentation

## 2019-01-27 MED ORDER — TECHNETIUM TC 99M-LABELED RED BLOOD CELLS IV KIT
22.3600 | PACK | Freq: Once | INTRAVENOUS | Status: AC | PRN
  Administered 2019-01-27: 14:00:00 22.36 via INTRAVENOUS

## 2019-02-08 ENCOUNTER — Other Ambulatory Visit: Payer: Self-pay

## 2019-02-09 ENCOUNTER — Inpatient Hospital Stay: Payer: Medicare Other

## 2019-02-09 ENCOUNTER — Inpatient Hospital Stay: Payer: Medicare Other | Attending: Internal Medicine

## 2019-02-09 ENCOUNTER — Inpatient Hospital Stay (HOSPITAL_BASED_OUTPATIENT_CLINIC_OR_DEPARTMENT_OTHER): Payer: Medicare Other | Admitting: Internal Medicine

## 2019-02-09 ENCOUNTER — Other Ambulatory Visit: Payer: Self-pay

## 2019-02-09 ENCOUNTER — Encounter: Payer: Self-pay | Admitting: Internal Medicine

## 2019-02-09 DIAGNOSIS — C773 Secondary and unspecified malignant neoplasm of axilla and upper limb lymph nodes: Secondary | ICD-10-CM

## 2019-02-09 DIAGNOSIS — Z171 Estrogen receptor negative status [ER-]: Secondary | ICD-10-CM

## 2019-02-09 DIAGNOSIS — C50812 Malignant neoplasm of overlapping sites of left female breast: Secondary | ICD-10-CM

## 2019-02-09 DIAGNOSIS — F0390 Unspecified dementia without behavioral disturbance: Secondary | ICD-10-CM

## 2019-02-09 DIAGNOSIS — Z5112 Encounter for antineoplastic immunotherapy: Secondary | ICD-10-CM | POA: Diagnosis not present

## 2019-02-09 DIAGNOSIS — C7951 Secondary malignant neoplasm of bone: Secondary | ICD-10-CM | POA: Insufficient documentation

## 2019-02-09 DIAGNOSIS — Z95828 Presence of other vascular implants and grafts: Secondary | ICD-10-CM

## 2019-02-09 DIAGNOSIS — C50912 Malignant neoplasm of unspecified site of left female breast: Secondary | ICD-10-CM

## 2019-02-09 LAB — CBC WITH DIFFERENTIAL/PLATELET
Abs Immature Granulocytes: 0.02 10*3/uL (ref 0.00–0.07)
Basophils Absolute: 0 10*3/uL (ref 0.0–0.1)
Basophils Relative: 1 %
Eosinophils Absolute: 0.2 10*3/uL (ref 0.0–0.5)
Eosinophils Relative: 4 %
HCT: 44.5 % (ref 36.0–46.0)
Hemoglobin: 14.3 g/dL (ref 12.0–15.0)
Immature Granulocytes: 0 %
Lymphocytes Relative: 25 %
Lymphs Abs: 1.3 10*3/uL (ref 0.7–4.0)
MCH: 29.8 pg (ref 26.0–34.0)
MCHC: 32.1 g/dL (ref 30.0–36.0)
MCV: 92.7 fL (ref 80.0–100.0)
Monocytes Absolute: 0.4 10*3/uL (ref 0.1–1.0)
Monocytes Relative: 9 %
Neutro Abs: 3.2 10*3/uL (ref 1.7–7.7)
Neutrophils Relative %: 61 %
Platelets: 281 10*3/uL (ref 150–400)
RBC: 4.8 MIL/uL (ref 3.87–5.11)
RDW: 13.2 % (ref 11.5–15.5)
WBC: 5.1 10*3/uL (ref 4.0–10.5)
nRBC: 0 % (ref 0.0–0.2)

## 2019-02-09 LAB — COMPREHENSIVE METABOLIC PANEL
ALT: 20 U/L (ref 0–44)
AST: 25 U/L (ref 15–41)
Albumin: 4.1 g/dL (ref 3.5–5.0)
Alkaline Phosphatase: 55 U/L (ref 38–126)
Anion gap: 9 (ref 5–15)
BUN: 17 mg/dL (ref 8–23)
CO2: 27 mmol/L (ref 22–32)
Calcium: 8.7 mg/dL — ABNORMAL LOW (ref 8.9–10.3)
Chloride: 102 mmol/L (ref 98–111)
Creatinine, Ser: 0.73 mg/dL (ref 0.44–1.00)
GFR calc Af Amer: >60 mL/min (ref >60)
GFR calc non Af Amer: >60 mL/min (ref >60)
Glucose, Bld: 134 mg/dL — ABNORMAL HIGH (ref 70–99)
Potassium: 3.8 mmol/L (ref 3.5–5.1)
Sodium: 138 mmol/L (ref 135–145)
Total Bilirubin: 0.7 mg/dL (ref 0.3–1.2)
Total Protein: 7.3 g/dL (ref 6.5–8.1)

## 2019-02-09 MED ORDER — TRASTUZUMAB CHEMO 150 MG IV SOLR
600.0000 mg | Freq: Once | INTRAVENOUS | Status: AC
  Administered 2019-02-09: 600 mg via INTRAVENOUS
  Filled 2019-02-09: qty 28.57

## 2019-02-09 MED ORDER — HEPARIN SOD (PORK) LOCK FLUSH 100 UNIT/ML IV SOLN
500.0000 [IU] | Freq: Once | INTRAVENOUS | Status: AC | PRN
  Administered 2019-02-09: 13:00:00 500 [IU]
  Filled 2019-02-09: qty 5

## 2019-02-09 MED ORDER — SODIUM CHLORIDE 0.9% FLUSH
10.0000 mL | Freq: Once | INTRAVENOUS | Status: AC
  Administered 2019-02-09: 10 mL via INTRAVENOUS
  Filled 2019-02-09: qty 10

## 2019-02-09 MED ORDER — SODIUM CHLORIDE 0.9 % IV SOLN
840.0000 mg | Freq: Once | INTRAVENOUS | Status: AC
  Administered 2019-02-09: 840 mg via INTRAVENOUS
  Filled 2019-02-09: qty 28

## 2019-02-09 MED ORDER — ACETAMINOPHEN 325 MG PO TABS
650.0000 mg | ORAL_TABLET | Freq: Once | ORAL | Status: AC
  Administered 2019-02-09: 650 mg via ORAL
  Filled 2019-02-09: qty 2

## 2019-02-09 MED ORDER — DIPHENHYDRAMINE HCL 50 MG/ML IJ SOLN
12.5000 mg | Freq: Once | INTRAMUSCULAR | Status: AC
  Administered 2019-02-09: 12.5 mg via INTRAVENOUS
  Filled 2019-02-09: qty 1

## 2019-02-09 MED ORDER — SODIUM CHLORIDE 0.9 % IV SOLN
Freq: Once | INTRAVENOUS | Status: AC
  Administered 2019-02-09: 10:00:00 via INTRAVENOUS
  Filled 2019-02-09: qty 250

## 2019-02-09 NOTE — Progress Notes (Signed)
[name] OFFICE PROGRESS NOTE  Patient Care Team: [name], [name], MD (General Surgery) Requested, [name]   Oncology History Overview Note  # June 2016- LEFT BREAST CA;  [name] ; [name] ; [name]; Her 2 Neu POS; [place] from July OF 2016; [name]; adjuvant Herceptin [ Finished July 2017]; AUG 2017- Neratinib x5 days; DISCON sec to diarrhea  # MID OCT 2018- Right breast mass-Bx- ER/PR-NEG; Her 2 NEU POSITIVE 1~2.5cm;  [?NEW primary]  # MID-OCT 2018-METASTATIC RECURRENT-oh sternal mass; Left Ax LN [Bx]/periportal LN  # OCT 25th 2018- TAXOL-HERCEPTIN-PERJETA; Jan 2019- CT PR; continue HP only; on HOLD since NOV 2019--sec to drop in EF/Sieziues   -------------------------------------------------------------------  # MUGA scan- July 28th 2017- 67%.  December 2019-EF 53%; January 2020 EF-52%  # chronic gait/balance issues  # Seizures/petit-mal/ Dr.Shah-Keppra [name] [id number]]  # June 2017- left breast Bx- fat necrosis [[name]  # july 2017-  BRCA 1& 2- NEG.   MOLECULAR TESTING- F ONE- TPS- 0%;  ERB2 amplification; PI3K/RET amplification Others**  --------------------------------------------------    DIAGNOSIS: [ OCT 2018]- REC/MET- BREAST CA ER/PR-NEG; her 2 POS  STAGE: 4  ;GOALS: Palliative  CURRENT/MOST RECENT THERAPY-chemo holiday    Carcinoma of overlapping sites of left breast in female, estrogen receptor negative ([place])      INTERVAL HISTORY:  [name] 78 y.o.  female pleasant patient above history of metastatic breast cancer on Herceptin plus Perjeta-is here for follow-up.  As per the husband patient continues to have chronic back pain hip pain.  Which is currently stable fairly NSAID patch/tramadol.  Continues have difficulty hearing problems.   Otherwise no headaches.  No falls.  Continues to have chronic left breast pain.  Review of Systems  Constitutional: Negative for chills, diaphoresis, fever, malaise/fatigue and weight loss.  HENT: Positive for hearing loss (Left-sided.). Negative for nosebleeds and sore throat.   Eyes: Negative for double vision.  Respiratory: Negative for cough, hemoptysis, sputum production, shortness of breath and wheezing.   Cardiovascular: Negative for chest pain, palpitations, orthopnea and leg swelling.  Gastrointestinal: Negative for abdominal pain, blood in stool, constipation, diarrhea, heartburn, melena, nausea and vomiting.  Genitourinary: Negative for dysuria, frequency and urgency.  Musculoskeletal: Positive for back pain and joint pain.  Skin: Negative.  Negative for itching and rash.  Neurological: Negative for dizziness, tingling, focal weakness, weakness and headaches.  Endo/Heme/Allergies: Does not bruise/bleed easily.  Psychiatric/Behavioral: Positive for memory loss. Negative for depression. The patient is not nervous/anxious and does not have insomnia.       PAST MEDICAL HISTORY :  Past Medical History:  Diagnosis Date  . Arthritis   . Brain tumor ([place]) 1995   meningeoma  . Breast cancer ([place]) 2018   left breast; surgery 01/11/15 with Dr. Bary Castilla; path with invasive mammary and DCIS  . Breast cancer of upper-inner quadrant of left female breast ([place]) 12/15/14   Completed radiation end of December and finished chemotherapy 2 weeks ago, Left breast invasive mammary carcinoma, T1cN3mc (1.5 cm); Grade 3, [name], [name], [name], .  .Marland KitchenCataract    bilat   . DDD (degenerative disc disease), lumbar    Lumbar, previously evaluated by Dr. HEarnestine Leys . Dementia ([place]   . Fibrocystic breast disease  prior biopsy  . Glaucoma   . 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 Hearing loss   . History of cancer chemotherapy   . History of  radiation therapy   . Hyperlipidemia, unspecified   . Imbalance   . Memory impairment    seen by Dr Manuella Ghazi  possible post crainiotomy from radiation  . 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  . Numbness and tingling    right hand   . Osteoarthritis   . Osteoporosis, post-menopausal   . Personal history of chemotherapy   . Personal history of radiation therapy   . Shoulder pain, right   . Wears glasses     PAST SURGICAL HISTORY :   Past Surgical History:  Procedure Laterality Date  . APPENDECTOMY  1950  . BRAIN SURGERY  1995   left frontal/temporal  . BREAST BIOPSY Left 1997  . BREAST BIOPSY Left 12/15/14   confirmed DCIS  . BREAST BIOPSY Left 01/08/2015   Procedure: BREAST BIOPSY WITH NEEDLE LOCALIZATION;  Surgeon: [name], MD;  Location: [facility] ORS;  Service: General;  Laterality: Left;  . BREAST LUMPECTOMY Left 01/08/2015   Procedure: LUMPECTOMY;  Surgeon: [name], MD;  Location: [facility] ORS;  Service: General;  Laterality: Left;  . COLONOSCOPY  2010   Dr. [name]  . ORIF ANKLE FRACTURE Right 08/05/2017   Procedure: OPEN REDUCTION INTERNAL FIXATION (ORIF) ANKLE FRACTURE;  Surgeon: Earnestine Leys, MD;  Location: [facility] ORS;  Service: Orthopedics;  Laterality: Right;  . PORTACATH PLACEMENT Right 01/16/2015   Procedure: INSERTION PORT-A-CATH;  Surgeon: [name], MD;  Location: [facility] ORS;  Service: General;  Laterality: Right;  . SENTINEL NODE BIOPSY Left 01/16/2015   Procedure: SENTINEL NODE BIOPSY;  Surgeon: [name], MD;  Location: [facility];  Service: General;  Laterality: Left;  . TOTAL HIP ARTHROPLASTY Right 09/04/2015   Procedure: RIGHT TOTAL HIP ARTHROPLASTY ANTERIOR APPROACH;  Surgeon: [name], MD;  Location: [facility];  Service: Orthopedics;  Laterality: Right;    FAMILY HISTORY :   Family History  Problem Relation Age of Onset  . Breast cancer Sister 66  . Addison's  disease Mother   . Hyperthyroidism Mother   . Osteoarthritis Mother   . Stroke Father   . Heart disease Father   . High blood pressure Father     SOCIAL HISTORY:   Social History   Tobacco Use  . Smoking status: Former Smoker    Packs/day: 0.25    Years: 5.00    Pack years: 1.25    Types: Cigarettes    Quit date: 07/28/1972    Years since quitting: 46.5  . Smokeless tobacco: Never Used  Substance Use Topics  . Alcohol use: Yes    Alcohol/week: 1.0 - 2.0 standard drinks    Types: 1 - 2 Glasses of wine per week    Comment: 1 Glass Wine / Night  . Drug use: No    ALLERGIES:  is allergic to donepezil.  MEDICATIONS:  Current Outpatient Medications  Medication Sig Dispense Refill  . acetaminophen (TYLENOL) 325 MG tablet Take 650 mg by mouth every 4 (four) hours as needed. Pain / increased temp.    . Calcium Carbonate-Vitamin D (CALTRATE 600+D PO) Take 1 tablet by mouth daily.    . celecoxib (CELEBREX) 200 MG capsule Take 200 mg by mouth daily.     Marland Kitchen  Cetirizine HCl (ZYRTEC ALLERGY PO) Take 1 tablet by mouth as needed (allergies).    . Cholecalciferol (VITAMIN D-3) 5000 units TABS Take 1 tablet by mouth daily.    Marland Kitchen levETIRAcetam (KEPPRA) 250 MG tablet 1 tablet daily at bedtime  5  . naproxen sodium (ALEVE) 220 MG tablet Take 220 mg by mouth daily as needed (pain).    . traMADol (ULTRAM) 50 MG tablet Take 1 tablet (50 mg total) by mouth 2 (two) times daily as needed for moderate pain. 60 tablet 0  . vitamin C (ASCORBIC ACID) 250 MG tablet Take 250 mg by mouth daily.    . vitamin E 400 UNIT capsule Take 400 Units by mouth daily.     . prochlorperazine (COMPAZINE) 10 MG tablet Take 1 tablet (10 mg total) every 6 (six) hours as needed by mouth for nausea or vomiting. (Patient not taking: Reported on 07/07/2018) 40 tablet 1   No current facility-administered medications for this visit.    Facility-Administered Medications Ordered in Other Visits  Medication Dose Route Frequency  Provider Last Rate Last Dose  . heparin lock flush 100 unit/mL  500 Units Intracatheter Once PRN Charlaine Dalton R, MD      . sodium chloride flush (NS) 0.9 % injection 10 mL  10 mL Intravenous PRN Cammie Sickle, MD   10 mL at 07/07/18 0830    PHYSICAL EXAMINATION: ECOG PERFORMANCE STATUS: 1 - Symptomatic but completely ambulatory  BP 108/72 (BP Location: Left Arm, Patient Position: Sitting)   Pulse 80   Temp (!) 97.1 F (36.2 C) (Tympanic)   Resp 18   Wt 169 lb 4.8 oz (76.8 kg)   BMI 29.99 kg/m   Filed Weights   02/09/19 0900  Weight: 169 lb 4.8 oz (76.8 kg)    Physical Exam  Constitutional: She is oriented to person, place, and time and well-developed, well-nourished, and in no distress.  Accompanied by daughter/husband.   HENT:  Head: Normocephalic and atraumatic.  Mouth/Throat: Oropharynx is clear and moist. No oropharyngeal exudate.  Eyes: Pupils are equal, round, and reactive to light.  Neck: Normal range of motion. Neck supple.  Cardiovascular: Normal rate and regular rhythm.  Pulmonary/Chest: No respiratory distress. She has no wheezes.  Abdominal: Soft. Bowel sounds are normal. She exhibits no distension and no mass. There is no abdominal tenderness. There is no rebound and no guarding.  Musculoskeletal: Normal range of motion.        General: No tenderness or edema.  Neurological: She is alert and oriented to person, place, and time.  Skin: Skin is warm.  Psychiatric: Affect normal.       LABORATORY DATA:  I have reviewed the data as listed    Component Value Date/Time   NA 138 02/09/2019 0828   K 3.8 02/09/2019 0828   CL 102 02/09/2019 0828   CO2 27 02/09/2019 0828   GLUCOSE 134 (H) 02/09/2019 0828   BUN 17 02/09/2019 0828   CREATININE 0.73 02/09/2019 0828   CALCIUM 8.7 (L) 02/09/2019 0828   PROT 7.3 02/09/2019 0828   ALBUMIN 4.1 02/09/2019 0828   AST 25 02/09/2019 0828   ALT 20 02/09/2019 0828   ALKPHOS 55 02/09/2019 0828   BILITOT  0.7 02/09/2019 0828   GFRNONAA >60 02/09/2019 0828   GFRAA >60 02/09/2019 0828    No results found for: SPEP, UPEP  Lab Results  Component Value Date   WBC 5.1 02/09/2019   NEUTROABS 3.2 02/09/2019   HGB 14.3  02/09/2019   HCT 44.5 02/09/2019   MCV 92.7 02/09/2019   PLT 281 02/09/2019      Chemistry      Component Value Date/Time   NA 138 02/09/2019 0828   K 3.8 02/09/2019 0828   CL 102 02/09/2019 0828   CO2 27 02/09/2019 0828   BUN 17 02/09/2019 0828   CREATININE 0.73 02/09/2019 0828      Component Value Date/Time   CALCIUM 8.7 (L) 02/09/2019 0828   ALKPHOS 55 02/09/2019 0828   AST 25 02/09/2019 0828   ALT 20 02/09/2019 0828   BILITOT 0.7 02/09/2019 0828       RADIOGRAPHIC STUDIES: I have personally reviewed the radiological images as listed and agreed with the findings in the report. No results found.   ASSESSMENT & PLAN:  Carcinoma of overlapping sites of left breast in female, estrogen receptor negative ([place]) #Metastatic breast cancer ER [name] HER-2/neu positive.  June 2020- CT scan chest and pelvis NED/stable sclerosis of the left manubrium.  Stable.  Last treatment Perjeta-herceptin on Nov 19th 2019 [secondary to COVID/drop in ejection fraction].  Will plan to restart Herceptin Perjeta today.   #Proceed with Herceptin Perjeta.  Labs today reviewed;  acceptable for treatment today.   #Drop in ejection fraction-baseline 68%; no significant improvement in ejection fraction from 52% [jan 2020]; January 27 2019 improved at ~60% ejection fraction  #Chronic dementia/debility-stable  # left ear hearing loss-? Neuro-vascular currently hyperbaric oxygen-stable  #Chronic back and hip pain- worse- on asper patch/ tramadol prn.   #Left breast/incisional pain-chronic-stable  #Discussed with the patient's husband over the phone.  In agreement.  DISPOSITION:  #Herceptin-Perjeta today # Follow up in 3 weeks-MD;labs-cbc/cmp; Herceptin-Perjeta- Dr.B    Orders  Placed This Encounter  Procedures  . CBC with Differential    Standing Status:   Future    Standing Expiration Date:   02/09/2020  . Comprehensive metabolic panel    Standing Status:   Future    Standing Expiration Date:   02/09/2020   All questions were answered. The patient knows to call the clinic with any problems, questions or concerns.      Cammie Sickle, MD 02/09/2019 12:56 PM

## 2019-02-09 NOTE — Assessment & Plan Note (Addendum)
#  Metastatic breast cancer ER [name] HER-2/neu positive.  June 2020- CT scan chest and pelvis NED/stable sclerosis of the left manubrium.  Stable.  Last treatment Perjeta-herceptin on Nov 19th 2019 [secondary to COVID/drop in ejection fraction].  Will plan to restart Herceptin Perjeta today.   #Proceed with Herceptin Perjeta.  Labs today reviewed;  acceptable for treatment today.   #Drop in ejection fraction-baseline 68%; no significant improvement in ejection fraction from 52% [jan 2020]; January 27 2019 improved at ~60% ejection fraction  #Chronic dementia/debility-stable  # left ear hearing loss-? Neuro-vascular currently hyperbaric oxygen-stable  #Chronic back and hip pain- worse- on asper patch/ tramadol prn.   #Left breast/incisional pain-chronic-stable  #Discussed with the patient's husband over the phone.  In agreement.  DISPOSITION:  #Herceptin-Perjeta today # Follow up in 3 weeks-MD;labs-cbc/cmp; Herceptin-Perjeta- Dr.B

## 2019-02-09 NOTE — Progress Notes (Signed)
Pt in for follow up, very hard of hearing, request to call husband on phone during MD visit.

## 2019-02-15 ENCOUNTER — Other Ambulatory Visit: Payer: Self-pay

## 2019-02-15 ENCOUNTER — Ambulatory Visit
Admission: RE | Admit: 2019-02-15 | Discharge: 2019-02-15 | Disposition: A | Discharge status: Home / Self Care | Payer: Medicare Other | Source: Ambulatory Visit | Attending: General Surgery | Admitting: General Surgery

## 2019-02-15 DIAGNOSIS — C773 Secondary and unspecified malignant neoplasm of axilla and upper limb lymph nodes: Secondary | ICD-10-CM

## 2019-02-15 DIAGNOSIS — C50912 Malignant neoplasm of unspecified site of left female breast: Secondary | ICD-10-CM | POA: Diagnosis not present

## 2019-02-22 ENCOUNTER — Ambulatory Visit: Payer: Medicare Other | Admitting: General Surgery

## 2019-03-01 ENCOUNTER — Other Ambulatory Visit: Payer: Self-pay

## 2019-03-02 ENCOUNTER — Inpatient Hospital Stay (HOSPITAL_BASED_OUTPATIENT_CLINIC_OR_DEPARTMENT_OTHER): Payer: Medicare Other | Admitting: Internal Medicine

## 2019-03-02 ENCOUNTER — Inpatient Hospital Stay: Payer: Medicare Other | Attending: Internal Medicine

## 2019-03-02 ENCOUNTER — Other Ambulatory Visit: Payer: Self-pay

## 2019-03-02 ENCOUNTER — Inpatient Hospital Stay: Payer: Medicare Other

## 2019-03-02 DIAGNOSIS — C50812 Malignant neoplasm of overlapping sites of left female breast: Secondary | ICD-10-CM

## 2019-03-02 DIAGNOSIS — Z171 Estrogen receptor negative status [ER-]: Secondary | ICD-10-CM | POA: Insufficient documentation

## 2019-03-02 DIAGNOSIS — C50912 Malignant neoplasm of unspecified site of left female breast: Secondary | ICD-10-CM

## 2019-03-02 DIAGNOSIS — C773 Secondary and unspecified malignant neoplasm of axilla and upper limb lymph nodes: Secondary | ICD-10-CM

## 2019-03-02 DIAGNOSIS — F0390 Unspecified dementia without behavioral disturbance: Secondary | ICD-10-CM | POA: Diagnosis not present

## 2019-03-02 DIAGNOSIS — Z95828 Presence of other vascular implants and grafts: Secondary | ICD-10-CM

## 2019-03-02 DIAGNOSIS — Z5112 Encounter for antineoplastic immunotherapy: Secondary | ICD-10-CM | POA: Diagnosis not present

## 2019-03-02 LAB — CBC WITH DIFFERENTIAL/PLATELET
Abs Immature Granulocytes: 0.02 10*3/uL (ref 0.00–0.07)
Basophils Absolute: 0 10*3/uL (ref 0.0–0.1)
Basophils Relative: 1 %
Eosinophils Absolute: 0.2 10*3/uL (ref 0.0–0.5)
Eosinophils Relative: 3 %
HCT: 43 % (ref 36.0–46.0)
Hemoglobin: 13.9 g/dL (ref 12.0–15.0)
Immature Granulocytes: 0 %
Lymphocytes Relative: 17 %
Lymphs Abs: 1.1 10*3/uL (ref 0.7–4.0)
MCH: 30 pg (ref 26.0–34.0)
MCHC: 32.3 g/dL (ref 30.0–36.0)
MCV: 92.7 fL (ref 80.0–100.0)
Monocytes Absolute: 0.5 10*3/uL (ref 0.1–1.0)
Monocytes Relative: 8 %
Neutro Abs: 4.7 10*3/uL (ref 1.7–7.7)
Neutrophils Relative %: 71 %
Platelets: 279 10*3/uL (ref 150–400)
RBC: 4.64 MIL/uL (ref 3.87–5.11)
RDW: 12.8 % (ref 11.5–15.5)
WBC: 6.6 10*3/uL (ref 4.0–10.5)
nRBC: 0 % (ref 0.0–0.2)

## 2019-03-02 LAB — COMPREHENSIVE METABOLIC PANEL
ALT: 20 U/L (ref 0–44)
AST: 24 U/L (ref 15–41)
Albumin: 4.3 g/dL (ref 3.5–5.0)
Alkaline Phosphatase: 58 U/L (ref 38–126)
Anion gap: 9 (ref 5–15)
BUN: 16 mg/dL (ref 8–23)
CO2: 27 mmol/L (ref 22–32)
Calcium: 8.9 mg/dL (ref 8.9–10.3)
Chloride: 100 mmol/L (ref 98–111)
Creatinine, Ser: 0.82 mg/dL (ref 0.44–1.00)
GFR calc Af Amer: >60 mL/min (ref >60)
GFR calc non Af Amer: >60 mL/min (ref >60)
Glucose, Bld: 154 mg/dL — ABNORMAL HIGH (ref 70–99)
Potassium: 3.8 mmol/L (ref 3.5–5.1)
Sodium: 136 mmol/L (ref 135–145)
Total Bilirubin: 0.5 mg/dL (ref 0.3–1.2)
Total Protein: 7.3 g/dL (ref 6.5–8.1)

## 2019-03-02 MED ORDER — SODIUM CHLORIDE 0.9 % IV SOLN
420.0000 mg | Freq: Once | INTRAVENOUS | Status: AC
  Administered 2019-03-02: 11:00:00 420 mg via INTRAVENOUS
  Filled 2019-03-02: qty 14

## 2019-03-02 MED ORDER — SODIUM CHLORIDE 0.9% FLUSH
10.0000 mL | Freq: Once | INTRAVENOUS | Status: AC
  Administered 2019-03-02: 10 mL via INTRAVENOUS
  Filled 2019-03-02: qty 10

## 2019-03-02 MED ORDER — TRASTUZUMAB CHEMO 150 MG IV SOLR
450.0000 mg | Freq: Once | INTRAVENOUS | Status: AC
  Administered 2019-03-02: 10:00:00 450 mg via INTRAVENOUS
  Filled 2019-03-02: qty 21.43

## 2019-03-02 MED ORDER — SODIUM CHLORIDE 0.9 % IV SOLN
Freq: Once | INTRAVENOUS | Status: AC
  Administered 2019-03-02: 10:00:00 via INTRAVENOUS
  Filled 2019-03-02: qty 250

## 2019-03-02 MED ORDER — ACETAMINOPHEN 325 MG PO TABS
650.0000 mg | ORAL_TABLET | Freq: Once | ORAL | Status: AC
  Administered 2019-03-02: 10:00:00 650 mg via ORAL
  Filled 2019-03-02: qty 2

## 2019-03-02 MED ORDER — HEPARIN SOD (PORK) LOCK FLUSH 100 UNIT/ML IV SOLN
500.0000 [IU] | Freq: Once | INTRAVENOUS | Status: AC | PRN
  Administered 2019-03-02: 12:00:00 500 [IU]
  Filled 2019-03-02: qty 5

## 2019-03-02 MED ORDER — DIPHENHYDRAMINE HCL 50 MG/ML IJ SOLN
12.5000 mg | Freq: Once | INTRAMUSCULAR | Status: AC
  Administered 2019-03-02: 12.5 mg via INTRAVENOUS
  Filled 2019-03-02: qty 1

## 2019-03-02 NOTE — Assessment & Plan Note (Addendum)
#  Metastatic breast cancer ER [name] HER-2/neu positive.  June 2020- CT scan chest and pelvis NED/stable sclerosis of the left manubrium.  Stable.  #Proceed with Herceptin Perjeta.  Labs today reviewed;  acceptable for treatment today.   #Drop in ejection fraction-baseline 68%;January 27 2019 improved at ~60% ejection fraction-we will again repeat in October 2020.  Clinically stable.  #Chronic dementia/debility- stable.   #Chronic back and hip pain- stable.  on asper patch/ tramadol prn.   #Left breast/incisional pain-chronic- stable.   #Discussed with the patient's husband over the phone.  In agreement.  DISPOSITION:  #Herceptin-Perjeta today # Follow up in 3 weeks-MD;labs-cbc/cmp; Herceptin-Perjeta- Dr.B

## 2019-03-02 NOTE — Progress Notes (Signed)
[place] OFFICE PROGRESS NOTE  Patient Care Team: [name], [name], MD (General Surgery) Requested, [name]   Oncology History Overview Note  # June 2016- LEFT BREAST CA;  [name] ; [name] ; [name]; Her 2 Neu POS; [place] from July OF 2016; [name]; adjuvant Herceptin [ Finished July 2017]; AUG 2017- Neratinib x5 days; DISCON sec to diarrhea  # MID OCT 2018- Right breast mass-Bx- ER/PR-NEG; Her 2 NEU POSITIVE 1~2.5cm;  [?NEW primary]  # MID-OCT 2018-METASTATIC RECURRENT-oh sternal mass; Left Ax LN [Bx]/periportal LN  # OCT 25th 2018- TAXOL-HERCEPTIN-PERJETA; Jan 2019- CT PR; continue HP only; on HOLD since NOV 2019--sec to drop in EF/Sieziues   -------------------------------------------------------------------  # MUGA scan- July 28th 2017- 67%.  December 2019-EF 53%; January 2020 EF-52%  # chronic gait/balance issues  # Seizures/petit-mal/ Dr.Shah-Keppra [name] [id number]]  # June 2017- left breast Bx- fat necrosis [[name]  # july 2017-  BRCA 1& 2- NEG.   MOLECULAR TESTING- F ONE- TPS- 0%;  ERB2 amplification; PI3K/RET amplification Others**  --------------------------------------------------    DIAGNOSIS: [ OCT 2018]- REC/MET- BREAST CA ER/PR-NEG; her 2 POS  STAGE: 4  ;GOALS: Palliative  CURRENT/MOST RECENT THERAPY-chemo holiday    Carcinoma of overlapping sites of left breast in female, estrogen receptor negative ([place])      INTERVAL HISTORY: Patient a poor historian given dementia.  Spoke to husband over the phone.  [name] 78 y.o.  female pleasant patient above history of metastatic breast cancer on Herceptin plus Perjeta-is here for follow-up.  As per the husband patient continues to chronic back pain/hip pain.  Is currently stable on tramadol/pain  patch.  Not any worse.  Continues to complain of chronic left axilla/breast pain.  She has been evaluated by surgery 2 weeks from now.   Review of Systems  Constitutional: Negative for chills, diaphoresis, fever, malaise/fatigue and weight loss.  HENT: Positive for hearing loss (Left-sided.). Negative for nosebleeds and sore throat.   Eyes: Negative for double vision.  Respiratory: Negative for cough, hemoptysis, sputum production, shortness of breath and wheezing.   Cardiovascular: Negative for chest pain, palpitations, orthopnea and leg swelling.  Gastrointestinal: Negative for abdominal pain, blood in stool, constipation, diarrhea, heartburn, melena, nausea and vomiting.  Genitourinary: Negative for dysuria, frequency and urgency.  Musculoskeletal: Positive for back pain and joint pain.  Skin: Negative.  Negative for itching and rash.  Neurological: Negative for dizziness, tingling, focal weakness, weakness and headaches.  Endo/Heme/Allergies: Does not bruise/bleed easily.  Psychiatric/Behavioral: Positive for memory loss. Negative for depression. The patient is not nervous/anxious and does not have insomnia.       PAST MEDICAL HISTORY :  Past Medical History:  Diagnosis Date  . Arthritis   . Brain tumor ([place]) 1995   meningeoma  . Breast cancer ([place]) 2018   left breast; surgery 01/11/15 with Dr. Bary Castilla; path with invasive mammary and DCIS  . Breast cancer of upper-inner quadrant of left female breast ([place]) 12/15/14   Completed radiation end of December and finished chemotherapy 2 weeks ago, Left breast invasive mammary carcinoma, T1cN19mc (1.5 cm); Grade 3, [name], [name], [name], .  .Marland KitchenCataract    bilat   . DDD (degenerative disc disease), lumbar    Lumbar, previously evaluated by Dr.  Earnestine Leys  . Dementia ([place])   . Fibrocystic breast disease    prior biopsy  . Glaucoma   . 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 Hearing  loss   . History of cancer chemotherapy   . History of radiation therapy   . Hyperlipidemia, unspecified   . Imbalance   . Memory impairment    seen by Dr Manuella Ghazi  possible post crainiotomy from radiation  . 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  . Numbness and tingling    right hand   . Osteoarthritis   . Osteoporosis, post-menopausal   . Personal history of chemotherapy   . Personal history of radiation therapy   . Shoulder pain, right   . Wears glasses     PAST SURGICAL HISTORY :   Past Surgical History:  Procedure Laterality Date  . APPENDECTOMY  1950  . BRAIN SURGERY  1995   left frontal/temporal  . BREAST BIOPSY Left 12/15/14   confirmed DCIS  . BREAST BIOPSY Left 01/08/2015   Procedure: BREAST BIOPSY WITH NEEDLE LOCALIZATION;  Surgeon: [name], MD;  Location: [facility] ORS;  Service: General;  Laterality: Left;  . BREAST EXCISIONAL BIOPSY Left 1997  . BREAST LUMPECTOMY Left 01/08/2015   Procedure: LUMPECTOMY;  Surgeon: [name], MD;  Location: [facility];  Service: General;  Laterality: Left;  . COLONOSCOPY  2010   Dr. [name]  . ORIF ANKLE FRACTURE Right 08/05/2017   Procedure: OPEN REDUCTION INTERNAL FIXATION (ORIF) ANKLE FRACTURE;  Surgeon: Earnestine Leys, MD;  Location: [facility] OR[facility];  Service: Orthopedics;  Laterality: Right;  . PORTACATH PLACEMENT Right 01/16/2015   Procedure: INSERTION PORT-A-CATH;  Surgeon: [name], MD;  Location: [facility];  Service: General;  Laterality: Right;  . SENTINEL NODE BIOPSY Left 01/16/2015   Procedure: SENTINEL NODE BIOPSY;  Surgeon: [name], MD;  Location: [facility];  Service: General;  Laterality: Left;  . TOTAL HIP ARTHROPLASTY Right 09/04/2015   Procedure: RIGHT TOTAL HIP ARTHROPLASTY ANTERIOR APPROACH;  Surgeon: [name], MD;  Location: [facility];  Service: Orthopedics;  Laterality: Right;    FAMILY HISTORY :   Family History  Problem  Relation Age of Onset  . Breast cancer Sister 38  . Addison's disease Mother   . Hyperthyroidism Mother   . Osteoarthritis Mother   . Stroke Father   . Heart disease Father   . High blood pressure Father     SOCIAL HISTORY:   Social History   Tobacco Use  . Smoking status: Former Smoker    Packs/day: 0.25    Years: 5.00    Pack years: 1.25    Types: Cigarettes    Quit date: 07/28/1972    Years since quitting: 46.6  . Smokeless tobacco: Never Used  Substance Use Topics  . Alcohol use: Yes    Alcohol/week: 1.0 - 2.0 standard drinks    Types: 1 - 2 Glasses of wine per week    Comment: 1 Glass Wine / Night  . Drug use: No    ALLERGIES:  is allergic to donepezil.  MEDICATIONS:  Current Outpatient Medications  Medication Sig Dispense Refill  . acetaminophen (TYLENOL) 325 MG tablet Take 650 mg by mouth every 4 (four) hours as needed. Pain / increased temp.    . bimatoprost (LUMIGAN) 0.01 % SOLN Lumigan 0.01 % eye drops  INT 1 GTT IN OS  HS    . Calcium Carbonate-Vitamin D (CALTRATE 600+D PO) Take 1 tablet by mouth daily.    . celecoxib (CELEBREX) 200 MG capsule Take 200 mg by mouth daily.     . Cetirizine HCl (ZYRTEC ALLERGY PO) Take 1 tablet by mouth as needed (allergies).    . Cholecalciferol (VITAMIN D-3) 5000 units TABS Take 1 tablet by mouth daily.    Marland Kitchen levETIRAcetam (KEPPRA) 250 MG tablet 1 tablet daily at bedtime  5  . naproxen sodium (ALEVE) 220 MG tablet Take 220 mg by mouth daily as needed (pain).    . traMADol (ULTRAM) 50 MG tablet Take 1 tablet (50 mg total) by mouth 2 (two) times daily as needed for moderate pain. 60 tablet 0  . vitamin C (ASCORBIC ACID) 250 MG tablet Take 250 mg by mouth daily.    . vitamin E 400 UNIT capsule Take 400 Units by mouth daily.     . prochlorperazine (COMPAZINE) 10 MG tablet Take 1 tablet (10 mg total) every 6 (six) hours as needed by mouth for nausea or vomiting. (Patient not taking: Reported on 07/07/2018) 40 tablet 1   No current  facility-administered medications for this visit.    Facility-Administered Medications Ordered in Other Visits  Medication Dose Route Frequency Provider Last Rate Last Dose  . sodium chloride flush (NS) 0.9 % injection 10 mL  10 mL Intravenous PRN Cammie Sickle, MD   10 mL at 07/07/18 0830    PHYSICAL EXAMINATION: ECOG PERFORMANCE STATUS: 1 - Symptomatic but completely ambulatory  BP 115/78   Pulse 79   Temp 98.3 F (36.8 C)   Resp 20   Wt 168 lb 12.8 oz (76.6 kg)   BMI 29.90 kg/m   Filed Weights   03/02/19 0922  Weight: 168 lb 12.8 oz (76.6 kg)    Physical Exam  Constitutional: She is oriented to person, place, and time and well-developed, well-nourished, and in no distress.  Patient is alone.  In a wheelchair.  HENT:  Head: Normocephalic and atraumatic.  Mouth/Throat: Oropharynx is clear and moist. No oropharyngeal exudate.  Eyes: Pupils are equal, round, and reactive to light.  Neck: Normal range of motion. Neck supple.  Cardiovascular: Normal rate and regular rhythm.  Pulmonary/Chest: No respiratory distress. She has no wheezes.  Abdominal: Soft. Bowel sounds are normal. She exhibits no distension and no mass. There is no abdominal tenderness. There is no rebound and no guarding.  Musculoskeletal: Normal range of motion.        General: No tenderness or edema.  Neurological: She is alert and oriented to person, place, and time.  Skin: Skin is warm.  Psychiatric: Affect normal.       LABORATORY DATA:  I have reviewed the data as listed    Component Value Date/Time   NA 136 03/02/2019 0852   K 3.8 03/02/2019 0852   CL 100 03/02/2019 0852   CO2 27 03/02/2019 0852   GLUCOSE 154 (H) 03/02/2019 0852   BUN 16 03/02/2019 0852   CREATININE 0.82 03/02/2019 0852   CALCIUM 8.9 03/02/2019 0852   PROT 7.3 03/02/2019 0852   ALBUMIN 4.3 03/02/2019 0852   AST 24 03/02/2019 0852   ALT 20 03/02/2019 0852   ALKPHOS 58 03/02/2019 0852   BILITOT 0.5 03/02/2019 0852    GFRNONAA >60 03/02/2019 0852   GFRAA >60 03/02/2019 0852    No results found for: SPEP, UPEP  Lab Results  Component Value Date   WBC 6.6 03/02/2019  NEUTROABS 4.7 03/02/2019   HGB 13.9 03/02/2019   HCT 43.0 03/02/2019   MCV 92.7 03/02/2019   PLT 279 03/02/2019      Chemistry      Component Value Date/Time   NA 136 03/02/2019 0852   K 3.8 03/02/2019 0852   CL 100 03/02/2019 0852   CO2 27 03/02/2019 0852   BUN 16 03/02/2019 0852   CREATININE 0.82 03/02/2019 0852      Component Value Date/Time   CALCIUM 8.9 03/02/2019 0852   ALKPHOS 58 03/02/2019 0852   AST 24 03/02/2019 0852   ALT 20 03/02/2019 0852   BILITOT 0.5 03/02/2019 0852       RADIOGRAPHIC STUDIES: I have personally reviewed the radiological images as listed and agreed with the findings in the report. No results found.   ASSESSMENT & PLAN:  Carcinoma of overlapping sites of left breast in female, estrogen receptor negative ([place]) #Metastatic breast cancer ER [name] HER-2/neu positive.  June 2020- CT scan chest and pelvis NED/stable sclerosis of the left manubrium.  Stable.  #Proceed with Herceptin Perjeta.  Labs today reviewed;  acceptable for treatment today.   #Drop in ejection fraction-baseline 68%;January 27 2019 improved at ~60% ejection fraction-we will again repeat in October 2020.  Clinically stable.  #Chronic dementia/debility- stable.   #Chronic back and hip pain- stable.  on asper patch/ tramadol prn.   #Left breast/incisional pain-chronic- stable.   #Discussed with the patient's husband over the phone.  In agreement.  DISPOSITION:  #Herceptin-Perjeta today # Follow up in 3 weeks-MD;labs-cbc/cmp; Herceptin-Perjeta- Dr.B    No orders of the defined types were placed in this encounter.  All questions were answered. The patient knows to call the clinic with any problems, questions or concerns.      Cammie Sickle, MD 03/02/2019 9:41 AM

## 2019-03-02 NOTE — Progress Notes (Signed)
Patient reports having an area on left axillary that is "sensitive" and hurts to touch for the past 2 weeks.

## 2019-03-09 ENCOUNTER — Ambulatory Visit: Payer: Medicare Other | Admitting: Surgery

## 2019-03-16 ENCOUNTER — Other Ambulatory Visit: Payer: Self-pay

## 2019-03-16 ENCOUNTER — Encounter: Payer: Self-pay | Admitting: Surgery

## 2019-03-16 ENCOUNTER — Ambulatory Visit (INDEPENDENT_AMBULATORY_CARE_PROVIDER_SITE_OTHER): Payer: Medicare Other | Admitting: Surgery

## 2019-03-16 VITALS — BP 120/69 | HR 69 | Temp 97.3°F | Ht 63.0 in | Wt 168.0 lb

## 2019-03-16 DIAGNOSIS — C50912 Malignant neoplasm of unspecified site of left female breast: Secondary | ICD-10-CM

## 2019-03-16 DIAGNOSIS — C773 Secondary and unspecified malignant neoplasm of axilla and upper limb lymph nodes: Secondary | ICD-10-CM | POA: Diagnosis not present

## 2019-03-16 NOTE — Patient Instructions (Addendum)
The patient is aware to call back for any questions or new concerns. The patient has been asked to return to the office in one year with a bilateral diagnostic mammogram.

## 2019-03-18 ENCOUNTER — Encounter: Payer: Self-pay | Admitting: Surgery

## 2019-03-18 NOTE — Progress Notes (Signed)
Outpatient Surgical Follow Up  03/18/2019  [name] is an 78 y.o. female.   Chief Complaint  Patient presents with  . Follow-up    1 yr f/u rec Bil Diag mammo [facility] 02/15/19, C50.812-    HPI: [name] is a 78 year old very pleasant female with a history of metastatic breast cancer to the left axilla.  Did undergo a lumpectomy followed by radiation therapy by Dr. Tollie Pizza on June 2016.  She reports some left axillary and left breast mild pain.  The pain is intermittent and dull in nature.  There is no fevers no chills.  No weight loss.  She did have a mammogram recently that I have personally review showing no evidence of any new pathological lesions.  She is currently on maintenance chemotherapy. SHe does have a history of dementia is fairly functional.  She is accompanied by her husband who is a retired Chief Strategy Officer in her community  Past Medical History:  Diagnosis Date  . Arthritis   . Brain tumor ([place]) 1995   meningeoma  . Breast cancer ([place]) 2018   left breast; surgery 01/11/15 with Dr. Bary Castilla; path with invasive mammary and DCIS  . Breast cancer of upper-inner quadrant of left female breast ([place]) 12/15/14   Completed radiation end of December and finished chemotherapy 2 weeks ago, Left breast invasive mammary carcinoma, T1cN82mc (1.5 cm); Grade 3, [name], [name], [name], .  .Marland KitchenCataract    bilat   . DDD (degenerative disc disease), lumbar    Lumbar, previously evaluated by Dr. HEarnestine Leys . Dementia ([place]   . Fibrocystic breast disease    prior biopsy  . Glaucoma   . 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 Hearing loss   . History of cancer chemotherapy   . History of radiation therapy   . Hyperlipidemia, unspecified   . Imbalance   . Memory impairment    seen by Dr SManuella Ghazi possible post crainiotomy from radiation  . [place] ([place]    Left cavern[facility]  . Numbness and tingling    right hand   . Osteoarthritis   . Osteoporosis, post-menopausal   . Personal history of chemotherapy   . Personal history of radiation therapy   . Shoulder pain, right   . Wears glasses     Past Surgical History:  Procedure Laterality Date  . APPENDECTOMY  1950  . BRAIN SURGERY  1995   left frontal/temporal  . BREAST BIOPSY Left 12/15/14   confirmed DCIS  . BREAST BIOPSY Left 01/08/2015   Procedure: BREAST BIOPSY WITH NEEDLE LOCALIZATION;  Surgeon: [name] MD;  Location: [facility];  Service: General;  Laterality: Left;  . BREAST EXCISIONAL BIOPSY Left 1997  . BREAST LUMPECTOMY Left 01/08/2015   Procedure: LUMPECTOMY;  Surgeon: [name] MD;  Location: [facility];  Service: General;  Laterality: Left;  . COLONOSCOPY  2010   Dr. ETiffany Kocher . ORIF ANKLE FRACTURE Right 08/05/2017   Procedure: OPEN REDUCTION INTERNAL FIXATION (ORIF) ANKLE FRACTURE;  Surgeon: MEarnestine Leys MD;  Location: [facility];  Service: Orthopedics;  Laterality: Right;  . PORTACATH PLACEMENT Right 01/16/2015   Procedure: INSERTION PORT-A-CATH;  Surgeon: [name] MD;  Location: [facility];  Service: General;  Laterality: Right;  . SENTINEL NODE BIOPSY Left 01/16/2015   Procedure: SENTINEL NODE BIOPSY;  Surgeon: JDellis Filbert  Amedeo Kinsman, MD;  Location: [facility];  Service: General;  Laterality: Left;  . TOTAL HIP ARTHROPLASTY Right 09/04/2015   Procedure: RIGHT TOTAL HIP ARTHROPLASTY ANTERIOR APPROACH;  Surgeon: [name], MD;  [facility];  Service: Orthopedics;  Laterality: Right;    Family History  Problem Relation Age of Onset  . Breast cancer Sister 72  . Addison's disease Mother   . Hyperthyroidism Mother   . Osteoarthritis Mother   . Stroke Father   . Heart disease Father   . High blood pressure Father     Social History:  reports that she quit smoking about 46 years ago. Her smoking use included cigarettes. She has a 1.25 pack-year smoking  history. She has never used smokeless tobacco. She reports current alcohol use of about 1.0 - 2.0 standard drinks of alcohol per week. She reports that she does not use drugs.  Allergies:  Allergies  Allergen Reactions  . Donepezil     Other reaction(s): Other (See Comments) Nightmares    Medications reviewed.    ROS Full ROS performed and is otherwise negative other than what is stated in HPI   BP 120/69   Pulse 69   Temp (!) 97.3 F (36.3 C) (Temporal)   Ht 5' 3"  (1.6 m)   Wt 168 lb (76.2 kg)   SpO2 93%   BMI 29.76 kg/m   Physical Exam Vitals signs and nursing note reviewed. Exam conducted with a chaperone present.  Constitutional:      General: She is not in acute distress.    Appearance: Normal appearance. She is normal weight.  Neck:     Musculoskeletal: Normal range of motion.  Cardiovascular:     Rate and Rhythm: Normal rate and regular rhythm.  Pulmonary:     Effort: Pulmonary effort is normal. No respiratory distress.     Breath sounds: Normal breath sounds. No stridor. No wheezing.     Comments: BREAST: Evidence of prior lumpectomy left side.  There is no new evidence of any malignant or suspicious masses.  There is no evidence of lymphadenopathy.  There is no evidence of lymphedema or infection Abdominal:     General: Abdomen is flat. There is no distension.     Palpations: There is no mass.     Hernia: No hernia is present.  Neurological:     General: No focal deficit present.     Mental Status: She is alert and oriented to person, place, and time.  Psychiatric:        Mood and Affect: Mood normal.        Behavior: Behavior normal.        Thought Content: Thought content normal.        Judgment: Judgment normal.       Assessment/Plan: 78 year old female with metastatic breast cancer.  At this point no new pathological lesions.  We will reschedule a mammogram in 1 year.  Currently I also discussed with them about breast pain and given her dementia  and other comorbidities they do not want to pursue any pharmaceutical intervention at this time.    Greater than 50% of the 25 minutes  visit was spent in counseling/coordination of care   Caroleen Hamman, MD [place] Surgeon

## 2019-03-22 ENCOUNTER — Other Ambulatory Visit: Payer: Self-pay

## 2019-03-23 ENCOUNTER — Inpatient Hospital Stay: Payer: Medicare Other

## 2019-03-23 ENCOUNTER — Other Ambulatory Visit: Payer: Self-pay

## 2019-03-23 ENCOUNTER — Encounter: Payer: Self-pay | Admitting: Internal Medicine

## 2019-03-23 ENCOUNTER — Telehealth: Payer: Self-pay | Admitting: Internal Medicine

## 2019-03-23 ENCOUNTER — Inpatient Hospital Stay (HOSPITAL_BASED_OUTPATIENT_CLINIC_OR_DEPARTMENT_OTHER): Payer: Medicare Other | Admitting: Internal Medicine

## 2019-03-23 DIAGNOSIS — C773 Secondary and unspecified malignant neoplasm of axilla and upper limb lymph nodes: Secondary | ICD-10-CM

## 2019-03-23 DIAGNOSIS — C50812 Malignant neoplasm of overlapping sites of left female breast: Secondary | ICD-10-CM

## 2019-03-23 DIAGNOSIS — Z5112 Encounter for antineoplastic immunotherapy: Secondary | ICD-10-CM | POA: Diagnosis not present

## 2019-03-23 DIAGNOSIS — Z171 Estrogen receptor negative status [ER-]: Secondary | ICD-10-CM

## 2019-03-23 DIAGNOSIS — C50912 Malignant neoplasm of unspecified site of left female breast: Secondary | ICD-10-CM

## 2019-03-23 LAB — CBC WITH DIFFERENTIAL/PLATELET
Abs Immature Granulocytes: 0.02 10*3/uL (ref 0.00–0.07)
Basophils Absolute: 0 10*3/uL (ref 0.0–0.1)
Basophils Relative: 0 %
Eosinophils Absolute: 0.1 10*3/uL (ref 0.0–0.5)
Eosinophils Relative: 3 %
HCT: 43.3 % (ref 36.0–46.0)
Hemoglobin: 14.2 g/dL (ref 12.0–15.0)
Immature Granulocytes: 0 %
Lymphocytes Relative: 20 %
Lymphs Abs: 1.1 10*3/uL (ref 0.7–4.0)
MCH: 30.3 pg (ref 26.0–34.0)
MCHC: 32.8 g/dL (ref 30.0–36.0)
MCV: 92.5 fL (ref 80.0–100.0)
Monocytes Absolute: 0.3 10*3/uL (ref 0.1–1.0)
Monocytes Relative: 5 %
Neutro Abs: 4 10*3/uL (ref 1.7–7.7)
Neutrophils Relative %: 72 %
Platelets: 288 10*3/uL (ref 150–400)
RBC: 4.68 MIL/uL (ref 3.87–5.11)
RDW: 12.9 % (ref 11.5–15.5)
WBC: 5.6 10*3/uL (ref 4.0–10.5)
nRBC: 0 % (ref 0.0–0.2)

## 2019-03-23 LAB — COMPREHENSIVE METABOLIC PANEL
ALT: 23 U/L (ref 0–44)
AST: 25 U/L (ref 15–41)
Albumin: 4 g/dL (ref 3.5–5.0)
Alkaline Phosphatase: 62 U/L (ref 38–126)
Anion gap: 8 (ref 5–15)
BUN: 13 mg/dL (ref 8–23)
CO2: 26 mmol/L (ref 22–32)
Calcium: 8.7 mg/dL — ABNORMAL LOW (ref 8.9–10.3)
Chloride: 103 mmol/L (ref 98–111)
Creatinine, Ser: 0.75 mg/dL (ref 0.44–1.00)
GFR calc Af Amer: >60 mL/min (ref >60)
GFR calc non Af Amer: >60 mL/min (ref >60)
Glucose, Bld: 182 mg/dL — ABNORMAL HIGH (ref 70–99)
Potassium: 3.8 mmol/L (ref 3.5–5.1)
Sodium: 137 mmol/L (ref 135–145)
Total Bilirubin: 0.6 mg/dL (ref 0.3–1.2)
Total Protein: 6.9 g/dL (ref 6.5–8.1)

## 2019-03-23 MED ORDER — DIPHENHYDRAMINE HCL 50 MG/ML IJ SOLN
12.5000 mg | Freq: Once | INTRAMUSCULAR | Status: AC
  Administered 2019-03-23: 10:00:00 12.5 mg via INTRAVENOUS
  Filled 2019-03-23: qty 1

## 2019-03-23 MED ORDER — HEPARIN SOD (PORK) LOCK FLUSH 100 UNIT/ML IV SOLN
500.0000 [IU] | Freq: Once | INTRAVENOUS | Status: AC | PRN
  Administered 2019-03-23: 500 [IU]
  Filled 2019-03-23 (×2): qty 5

## 2019-03-23 MED ORDER — SODIUM CHLORIDE 0.9 % IV SOLN
Freq: Once | INTRAVENOUS | Status: AC
  Administered 2019-03-23: 10:00:00 via INTRAVENOUS
  Filled 2019-03-23: qty 250

## 2019-03-23 MED ORDER — TRASTUZUMAB CHEMO 150 MG IV SOLR
450.0000 mg | Freq: Once | INTRAVENOUS | Status: AC
  Administered 2019-03-23: 450 mg via INTRAVENOUS
  Filled 2019-03-23: qty 21.43

## 2019-03-23 MED ORDER — SODIUM CHLORIDE 0.9 % IV SOLN
420.0000 mg | Freq: Once | INTRAVENOUS | Status: AC
  Administered 2019-03-23: 420 mg via INTRAVENOUS
  Filled 2019-03-23: qty 14

## 2019-03-23 MED ORDER — ACETAMINOPHEN 325 MG PO TABS
650.0000 mg | ORAL_TABLET | Freq: Once | ORAL | Status: AC
  Administered 2019-03-23: 650 mg via ORAL
  Filled 2019-03-23: qty 2

## 2019-03-23 NOTE — Progress Notes (Signed)
[name] OFFICE PROGRESS NOTE  Patient Care Team: [name], [name], MD (General Surgery) Requested, [name]   Oncology History Overview Note  # June 2016- LEFT BREAST CA;  [name] ; [name] ; [name]; Her 2 Neu POS; [place] from July OF 2016; [name]; adjuvant Herceptin [ Finished July 2017]; AUG 2017- Neratinib x5 days; DISCON sec to diarrhea  # MID OCT 2018- Right breast mass-Bx- ER/PR-NEG; Her 2 NEU POSITIVE 1~2.5cm;  [?NEW primary]  # MID-OCT 2018-METASTATIC RECURRENT-oh sternal mass; Left Ax LN [Bx]/periportal LN  # OCT 25th 2018- TAXOL-HERCEPTIN-PERJETA; Jan 2019- CT PR; continue HP only; on HOLD since NOV 2019--sec to drop in EF/Sieziues   -------------------------------------------------------------------  # MUGA scan- July 28th 2017- 67%.  December 2019-EF 53%; January 2020 EF-52%  # chronic gait/balance issues  # Seizures/petit-mal/ Dr.Shah-Keppra [name] [id number]]  # June 2017- left breast Bx- fat necrosis [[name]  # july 2017-  BRCA 1& 2- NEG.   MOLECULAR TESTING- F ONE- TPS- 0%;  ERB2 amplification; PI3K/RET amplification Others**  --------------------------------------------------    DIAGNOSIS: [ OCT 2018]- REC/MET- BREAST CA ER/PR-NEG; her 2 POS  STAGE: 4  ;GOALS: Palliative  CURRENT/MOST RECENT THERAPY-chemo holiday    Carcinoma of overlapping sites of left breast in female, estrogen receptor negative ([place])      INTERVAL HISTORY: Patient a poor historian given dementia.  Patient's husband unavailable over the phone.   Michele [name] [name] 78 y.o.  female pleasant patient above history of metastatic breast cancer on Herceptin plus Perjeta-is here for follow-up.  Patient denies any diarrhea.  Denies any nausea vomiting.  No skin rash.  She complains of  worsening pain in the left shoulder/left arm.   Review of Systems  Unable to perform ROS: Dementia      PAST MEDICAL HISTORY :  Past Medical History:  Diagnosis Date  . Arthritis   . 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 Breast cancer ([place]) 2018   left breast; surgery 01/11/15 with Dr. Bary Castilla; path with invasive mammary and DCIS  . Breast cancer of upper-inner quadrant of left female breast ([place]) 12/15/14   Completed radiation end of December and finished chemotherapy 2 weeks ago, Left breast invasive mammary carcinoma, T1cN3mc (1.5 cm); Grade 3, [name], [name], [name], .  .Marland KitchenCataract    bilat   . DDD (degenerative disc disease), lumbar    Lumbar, previously evaluated by Dr. HEarnestine Leys . Dementia ([place]   . Fibrocystic breast disease    prior biopsy  . Glaucoma   . 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 Hearing loss   . History of cancer chemotherapy   . History of radiation therapy   . Hyperlipidemia, unspecified   . Imbalance   . Memory impairment    seen by Dr SManuella Ghazi possible post crainiotomy from radiation  . 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  . Numbness and tingling    right hand   . Osteoarthritis   . Osteoporosis, post-menopausal   . Personal history of chemotherapy   . Personal history of radiation therapy   . Shoulder pain, right   . Wears glasses  PAST SURGICAL HISTORY :   Past Surgical History:  Procedure Laterality Date  . APPENDECTOMY  1950  . BRAIN SURGERY  1995   left frontal/temporal  . BREAST BIOPSY Left 12/15/14   confirmed DCIS  . BREAST BIOPSY Left 01/08/2015   Procedure: BREAST BIOPSY WITH NEEDLE LOCALIZATION;  Surgeon: [name], MD;  Location: [facility] ORS;  Service: General;  Laterality: Left;  . BREAST EXCISIONAL BIOPSY Left 1997  . BREAST LUMPECTOMY Left 01/08/2015   Procedure: LUMPECTOMY;  Surgeon:  [name], MD;  Location: [facility];  Service: General;  Laterality: Left;  . COLONOSCOPY  2010   Dr. [name]  . ORIF ANKLE FRACTURE Right 08/05/2017   Procedure: OPEN REDUCTION INTERNAL FIXATION (ORIF) ANKLE FRACTURE;  Surgeon: Earnestine Leys, MD;  Location: [facility] OR[facility];  Service: Orthopedics;  Laterality: Right;  . PORTACATH PLACEMENT Right 01/16/2015   Procedure: INSERTION PORT-A-CATH;  Surgeon: [name], MD;  Location: [facility];  Service: General;  Laterality: Right;  . SENTINEL NODE BIOPSY Left 01/16/2015   Procedure: SENTINEL NODE BIOPSY;  Surgeon: [name], MD;  Location: [facility];  Service: General;  Laterality: Left;  . TOTAL HIP ARTHROPLASTY Right 09/04/2015   Procedure: RIGHT TOTAL HIP ARTHROPLASTY ANTERIOR APPROACH;  Surgeon: [name], MD;  Location: [facility];  Service: Orthopedics;  Laterality: Right;    FAMILY HISTORY :   Family History  Problem Relation Age of Onset  . Breast cancer Sister 33  . Addison's disease Mother   . Hyperthyroidism Mother   . Osteoarthritis Mother   . Stroke Father   . Heart disease Father   . High blood pressure Father     SOCIAL HISTORY:   Social History   Tobacco Use  . Smoking status: Former Smoker    Packs/day: 0.25    Years: 5.00    Pack years: 1.25    Types: Cigarettes    Quit date: 07/28/1972    Years since quitting: 46.6  . Smokeless tobacco: Never Used  Substance Use Topics  . Alcohol use: Yes    Alcohol/week: 1.0 - 2.0 standard drinks    Types: 1 - 2 Glasses of wine per week    Comment: 1 Glass Wine / Night  . Drug use: No    ALLERGIES:  is allergic to donepezil.  MEDICATIONS:  Current Outpatient Medications  Medication Sig Dispense Refill  . acetaminophen (TYLENOL) 325 MG tablet Take 650 mg by mouth every 4 (four) hours as needed. Pain / increased temp.    . bimatoprost (LUMIGAN) 0.01 % SOLN Lumigan 0.01 % eye drops  INT 1 GTT IN OS HS    . Calcium Carbonate-Vitamin D (CALTRATE 600+D PO) Take 1  tablet by mouth daily.    . celecoxib (CELEBREX) 200 MG capsule Take 200 mg by mouth daily.     . Cetirizine HCl (ZYRTEC ALLERGY PO) Take 1 tablet by mouth as needed (allergies).    . Cholecalciferol (VITAMIN D-3) 5000 units TABS Take 1 tablet by mouth daily.    Marland Kitchen levETIRAcetam (KEPPRA) 250 MG tablet 1 tablet daily at bedtime  5  . naproxen sodium (ALEVE) 220 MG tablet Take 220 mg by mouth daily as needed (pain).    . prochlorperazine (COMPAZINE) 10 MG tablet Take 1 tablet (10 mg total) every 6 (six) hours as needed by mouth for nausea or vomiting. 40 tablet 1  . traMADol (ULTRAM) 50 MG tablet Take 1 tablet (50 mg total) by mouth 2 (two) times daily  as needed for moderate pain. 60 tablet 0  . vitamin E 400 UNIT capsule Take 400 Units by mouth daily.      No current facility-administered medications for this visit.    Facility-Administered Medications Ordered in Other Visits  Medication Dose Route Frequency Provider Last Rate Last Dose  . sodium chloride flush (NS) 0.9 % injection 10 mL  10 mL Intravenous PRN Cammie Sickle, MD   10 mL at 07/07/18 0830    PHYSICAL EXAMINATION: ECOG PERFORMANCE STATUS: 1 - Symptomatic but completely ambulatory  BP 117/77   Pulse 75   Temp (!) 96.6 F (35.9 C) (Tympanic)   Resp 20   Ht _0  (1.6 m)   Wt 167 lb (75.8 kg)   BMI 29.58 kg/m   Filed Weights   03/23/19 0911  Weight: 167 lb (75.8 kg)    Physical Exam  Constitutional: She is oriented to person, place, and time and well-developed, well-nourished, and in no distress.  Patient is alone.  In a wheelchair.  HENT:  Head: Normocephalic and atraumatic.  Mouth/Throat: Oropharynx is clear and moist. No oropharyngeal exudate.  Eyes: Pupils are equal, round, and reactive to light.  Neck: Normal range of motion. Neck supple.  Cardiovascular: Normal rate and regular rhythm.  Pulmonary/Chest: No respiratory distress. She has no wheezes.  Abdominal: Soft. Bowel sounds are normal. She  exhibits no distension and no mass. There is no abdominal tenderness. There is no rebound and no guarding.  Musculoskeletal: Normal range of motion.        General: No tenderness or edema.  Neurological: She is alert and oriented to person, place, and time.  Skin: Skin is warm.  Psychiatric: Affect normal.    LABORATORY DATA:  I have reviewed the data as listed    Component Value Date/Time   NA 137 03/23/2019 0849   K 3.8 03/23/2019 0849   CL 103 03/23/2019 0849   CO2 26 03/23/2019 0849   GLUCOSE 182 (H) 03/23/2019 0849   BUN 13 03/23/2019 0849   CREATININE 0.75 03/23/2019 0849   CALCIUM 8.7 (L) 03/23/2019 0849   PROT 6.9 03/23/2019 0849   ALBUMIN 4.0 03/23/2019 0849   AST 25 03/23/2019 0849   ALT 23 03/23/2019 0849   ALKPHOS 62 03/23/2019 0849   BILITOT 0.6 03/23/2019 0849   GFRNONAA >60 03/23/2019 0849   GFRAA >60 03/23/2019 0849    No results found for: SPEP, UPEP  Lab Results  Component Value Date   WBC 5.6 03/23/2019   NEUTROABS 4.0 03/23/2019   HGB 14.2 03/23/2019   HCT 43.3 03/23/2019   MCV 92.5 03/23/2019   PLT 288 03/23/2019      Chemistry      Component Value Date/Time   NA 137 03/23/2019 0849   K 3.8 03/23/2019 0849   CL 103 03/23/2019 0849   CO2 26 03/23/2019 0849   BUN 13 03/23/2019 0849   CREATININE 0.75 03/23/2019 0849      Component Value Date/Time   CALCIUM 8.7 (L) 03/23/2019 0849   ALKPHOS 62 03/23/2019 0849   AST 25 03/23/2019 0849   ALT 23 03/23/2019 0849   BILITOT 0.6 03/23/2019 0849       RADIOGRAPHIC STUDIES: I have personally reviewed the radiological images as listed and agreed with the findings in the report. No results found.   ASSESSMENT & PLAN:  Carcinoma of overlapping sites of left breast in female, estrogen receptor negative ([place]) #Metastatic breast cancer ER [name] HER-2/neu positive.  June 2020- CT scan chest and pelvis NED/stable sclerosis of the left manubrium.  STABLE  #Proceed with Herceptin Perjeta.   Labs today reviewed;  acceptable for treatment today.   #Drop in ejection fraction-baseline 68%;January 27 2019 improved at ~60% ejection fraction-we will again repeat in October 2020.  STABLE.  #Chronic back and hip pain- stable.  on asper patch/ tramadol prn.  # left shoulder pain-/ #Left breast/incisional pain-? Worse; will discuss with husband re: order X-ray/US L UE.   # dementia/ chronic debility-STABLE.   # Unable to reach husband x2/ will call again.  DISPOSITION:  #Herceptin-Perjeta today # Follow up in 3 weeks-MD;labs-cbc/cmp; Herceptin-Perjeta- Dr.B    No orders of the defined types were placed in this encounter.  All questions were answered. The patient knows to call the clinic with any problems, questions or concerns.      Cammie Sickle, MD 03/23/2019 9:40 AM

## 2019-03-23 NOTE — Assessment & Plan Note (Addendum)
#  Metastatic breast cancer ER [name] HER-2/neu positive.  June 2020- CT scan chest and pelvis NED/stable sclerosis of the left manubrium.  STABLE  #Proceed with Herceptin Perjeta.  Labs today reviewed;  acceptable for treatment today.   #Drop in ejection fraction-baseline 68%;January 27 2019 improved at ~60% ejection fraction-we will again repeat in October 2020.  STABLE.  #Chronic back and hip pain- stable.  on asper patch/ tramadol prn.  # left shoulder pain-/ #Left breast/incisional pain-? Worse; will discuss with husband re: order X-ray/US L UE.   # dementia/ chronic debility-STABLE.   # Unable to reach husband x2/ will call again.  DISPOSITION:  #Herceptin-Perjeta today # Follow up in 3 weeks-MD;labs-cbc/cmp; Herceptin-Perjeta- Dr.B

## 2019-03-23 NOTE — Telephone Encounter (Signed)
Spoke to husband-[phone]  #Left shoulder pain/left breast pain chronic also recent evaluation with surgery.  It worsened the let us know-we will get x-rays/ultrasound upper extremity.  ? Fatigue/ drowsiness- on keppra a day; awaiting to speak to Dr.Shah regarding this.  If not improved to call us will get MRI of the brain

## 2019-04-12 ENCOUNTER — Other Ambulatory Visit: Payer: Self-pay

## 2019-04-12 NOTE — Progress Notes (Signed)
Patient pre screened for office appointment, no questions or concerns today. 

## 2019-04-13 ENCOUNTER — Other Ambulatory Visit: Payer: Self-pay

## 2019-04-13 ENCOUNTER — Inpatient Hospital Stay: Payer: Medicare Other | Attending: Internal Medicine

## 2019-04-13 ENCOUNTER — Inpatient Hospital Stay: Payer: Medicare Other

## 2019-04-13 ENCOUNTER — Inpatient Hospital Stay (HOSPITAL_BASED_OUTPATIENT_CLINIC_OR_DEPARTMENT_OTHER): Payer: Medicare Other | Admitting: Internal Medicine

## 2019-04-13 DIAGNOSIS — C50812 Malignant neoplasm of overlapping sites of left female breast: Secondary | ICD-10-CM

## 2019-04-13 DIAGNOSIS — H409 Unspecified glaucoma: Secondary | ICD-10-CM | POA: Diagnosis not present

## 2019-04-13 DIAGNOSIS — C7951 Secondary malignant neoplasm of bone: Secondary | ICD-10-CM | POA: Diagnosis not present

## 2019-04-13 DIAGNOSIS — Z5112 Encounter for antineoplastic immunotherapy: Secondary | ICD-10-CM | POA: Insufficient documentation

## 2019-04-13 DIAGNOSIS — F0390 Unspecified dementia without behavioral disturbance: Secondary | ICD-10-CM | POA: Insufficient documentation

## 2019-04-13 DIAGNOSIS — C773 Secondary and unspecified malignant neoplasm of axilla and upper limb lymph nodes: Secondary | ICD-10-CM

## 2019-04-13 DIAGNOSIS — Z95828 Presence of other vascular implants and grafts: Secondary | ICD-10-CM

## 2019-04-13 DIAGNOSIS — Z171 Estrogen receptor negative status [ER-]: Secondary | ICD-10-CM | POA: Diagnosis not present

## 2019-04-13 DIAGNOSIS — C50912 Malignant neoplasm of unspecified site of left female breast: Secondary | ICD-10-CM

## 2019-04-13 LAB — COMPREHENSIVE METABOLIC PANEL
ALT: 27 U/L (ref 0–44)
AST: 28 U/L (ref 15–41)
Albumin: 4.1 g/dL (ref 3.5–5.0)
Alkaline Phosphatase: 60 U/L (ref 38–126)
Anion gap: 8 (ref 5–15)
BUN: 14 mg/dL (ref 8–23)
CO2: 25 mmol/L (ref 22–32)
Calcium: 8.7 mg/dL — ABNORMAL LOW (ref 8.9–10.3)
Chloride: 102 mmol/L (ref 98–111)
Creatinine, Ser: 0.67 mg/dL (ref 0.44–1.00)
GFR calc Af Amer: >60 mL/min (ref >60)
GFR calc non Af Amer: >60 mL/min (ref >60)
Glucose, Bld: 180 mg/dL — ABNORMAL HIGH (ref 70–99)
Potassium: 3.6 mmol/L (ref 3.5–5.1)
Sodium: 135 mmol/L (ref 135–145)
Total Bilirubin: 0.6 mg/dL (ref 0.3–1.2)
Total Protein: 6.8 g/dL (ref 6.5–8.1)

## 2019-04-13 LAB — CBC WITH DIFFERENTIAL/PLATELET
Abs Immature Granulocytes: 0.01 10*3/uL (ref 0.00–0.07)
Basophils Absolute: 0 10*3/uL (ref 0.0–0.1)
Basophils Relative: 0 %
Eosinophils Absolute: 0.2 10*3/uL (ref 0.0–0.5)
Eosinophils Relative: 3 %
HCT: 43.6 % (ref 36.0–46.0)
Hemoglobin: 14.1 g/dL (ref 12.0–15.0)
Immature Granulocytes: 0 %
Lymphocytes Relative: 17 %
Lymphs Abs: 0.9 10*3/uL (ref 0.7–4.0)
MCH: 30.1 pg (ref 26.0–34.0)
MCHC: 32.3 g/dL (ref 30.0–36.0)
MCV: 93.2 fL (ref 80.0–100.0)
Monocytes Absolute: 0.3 10*3/uL (ref 0.1–1.0)
Monocytes Relative: 6 %
Neutro Abs: 3.9 10*3/uL (ref 1.7–7.7)
Neutrophils Relative %: 74 %
Platelets: 286 10*3/uL (ref 150–400)
RBC: 4.68 MIL/uL (ref 3.87–5.11)
RDW: 12.6 % (ref 11.5–15.5)
WBC: 5.3 10*3/uL (ref 4.0–10.5)
nRBC: 0 % (ref 0.0–0.2)

## 2019-04-13 MED ORDER — ACETAMINOPHEN 325 MG PO TABS
650.0000 mg | ORAL_TABLET | Freq: Once | ORAL | Status: AC
  Administered 2019-04-13: 650 mg via ORAL
  Filled 2019-04-13: qty 2

## 2019-04-13 MED ORDER — TRASTUZUMAB CHEMO 150 MG IV SOLR
450.0000 mg | Freq: Once | INTRAVENOUS | Status: AC
  Administered 2019-04-13: 450 mg via INTRAVENOUS
  Filled 2019-04-13: qty 21.43

## 2019-04-13 MED ORDER — SODIUM CHLORIDE 0.9 % IV SOLN
Freq: Once | INTRAVENOUS | Status: AC
  Administered 2019-04-13: 10:00:00 via INTRAVENOUS
  Filled 2019-04-13: qty 250

## 2019-04-13 MED ORDER — HEPARIN SOD (PORK) LOCK FLUSH 100 UNIT/ML IV SOLN
500.0000 [IU] | Freq: Once | INTRAVENOUS | Status: AC | PRN
  Administered 2019-04-13: 12:00:00 500 [IU]
  Filled 2019-04-13: qty 5

## 2019-04-13 MED ORDER — SODIUM CHLORIDE 0.9 % IV SOLN
420.0000 mg | Freq: Once | INTRAVENOUS | Status: AC
  Administered 2019-04-13: 420 mg via INTRAVENOUS
  Filled 2019-04-13: qty 14

## 2019-04-13 MED ORDER — SODIUM CHLORIDE 0.9% FLUSH
10.0000 mL | Freq: Once | INTRAVENOUS | Status: AC
  Administered 2019-04-13: 10 mL via INTRAVENOUS
  Filled 2019-04-13: qty 10

## 2019-04-13 MED ORDER — DIPHENHYDRAMINE HCL 50 MG/ML IJ SOLN
12.5000 mg | Freq: Once | INTRAMUSCULAR | Status: AC
  Administered 2019-04-13: 12.5 mg via INTRAVENOUS
  Filled 2019-04-13: qty 1

## 2019-04-13 NOTE — Progress Notes (Signed)
Pt is having right lower pain-pt states that it is not new

## 2019-04-13 NOTE — Progress Notes (Signed)
[name] OFFICE PROGRESS NOTE  Patient Care Team: [name], [name], MD (General Surgery) Requested, [name]   Oncology History Overview Note  # June 2016- LEFT BREAST CA;  [name] ; [name] ; [name]; Her 2 Neu POS; [place] from July OF 2016; [name]; adjuvant Herceptin [ Finished July 2017]; AUG 2017- Neratinib x5 days; DISCON sec to diarrhea  # MID OCT 2018- Right breast mass-Bx- ER/PR-NEG; Her 2 NEU POSITIVE 1~2.5cm;  [?NEW primary]  # MID-OCT 2018-METASTATIC RECURRENT-oh sternal mass; Left Ax LN [Bx]/periportal LN  # OCT 25th 2018- TAXOL-HERCEPTIN-PERJETA; Jan 2019- CT PR; continue HP only; on HOLD since NOV 2019--sec to drop in EF/Sieziues  # July 15th 2020- RE-STARTED HP   -------------------------------------------------------------------  # MUGA scan- July 28th 2017- 67%.  December 2019-EF 53%; January 2020 EF-52%  # chronic gait/balance issues  # Seizures/petit-mal/ Dr.Shah-Keppra [name] [id number]]  # June 2017- left breast Bx- fat necrosis [[name]  # july 2017-  BRCA 1& 2- NEG.   MOLECULAR TESTING- F ONE- TPS- 0%;  ERB2 amplification; PI3K/RET amplification Others**  --------------------------------------------------    DIAGNOSIS: [ OCT 2018]- REC/MET- BREAST CA ER/PR-NEG; her 2 POS  STAGE: 4  ;GOALS: Palliative  CURRENT/MOST RECENT THERAPY- Herceptin-Perjeta    Carcinoma of overlapping sites of left breast in female, estrogen receptor negative ([place])      INTERVAL HISTORY: Patient a poor historian given dementia.  Patient's husband unavailable over the phone.   [name] 78 y.o.  female pleasant patient above history of metastatic breast cancer on Herceptin plus Perjeta-is here for follow-up.  No nausea no vomiting.  Diarrhea.  No headaches.    Chronic back pain/hip pain.  She fractured her tooth; awaiting replacement this morning.  Note swelling in the legs.  No shortness of breath.  Husband wanted opinion regarding patient's plan colonoscopy/as recommended by GI.  Review of Systems  Unable to perform ROS: Dementia     PAST MEDICAL HISTORY :  Past Medical History:  Diagnosis Date  . Arthritis   . 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 Breast cancer ([place]) 2018   left breast; surgery 01/11/15 with Dr. Bary Castilla; path with invasive mammary and DCIS  . Breast cancer of upper-inner quadrant of left female breast ([place]) 12/15/14   Completed radiation end of December and finished chemotherapy 2 weeks ago, Left breast invasive mammary carcinoma, [name]; Grade 3, [name], [name], [name], .  .Marland KitchenCataract    bilat   . DDD (degenerative disc disease), lumbar    Lumbar, previously evaluated by Dr. HEarnestine Leys . Dementia ([place]   . Fibrocystic breast disease    prior biopsy  . Glaucoma   . 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 Hearing loss   . History of cancer chemotherapy   . History of radiation therapy   . Hyperlipidemia, unspecified   . Imbalance   . Memory impairment    seen by Dr SManuella Ghazi possible post crainiotomy from radiation  . [place]eningioma ([place]    [facility]  . Numbness and tingling    right hand   . Osteoarthritis   . Osteoporosis, post-menopausal   .  Personal history of chemotherapy   . Personal history of radiation therapy   . Shoulder pain, right   . Wears glasses     PAST SURGICAL HISTORY :   Past Surgical History:  Procedure Laterality Date  . APPENDECTOMY  1950  . BRAIN SURGERY  1995   left frontal/temporal  . BREAST BIOPSY Left 12/15/14   confirmed DCIS  . BREAST BIOPSY Left 01/08/2015   Procedure: BREAST BIOPSY WITH NEEDLE LOCALIZATION;  Surgeon: [name], MD;  Location: [facility];  Service: General;  Laterality: Left;  . BREAST EXCISIONAL BIOPSY Left 1997  . BREAST LUMPECTOMY Left 01/08/2015   Procedure: LUMPECTOMY;  Surgeon: [name], MD;  Location: [facility];  Service: General;  Laterality: Left;  . COLONOSCOPY  2010   Dr. [name]  . ORIF ANKLE FRACTURE Right 08/05/2017   Procedure: OPEN REDUCTION INTERNAL FIXATION (ORIF) ANKLE FRACTURE;  Surgeon: Earnestine Leys, MD;  Location: [facility];  Service: Orthopedics;  Laterality: Right;  . PORTACATH PLACEMENT Right 01/16/2015   Procedure: INSERTION PORT-A-CATH;  Surgeon: [name], MD;  Location: [facility];  Service: General;  Laterality: Right;  . SENTINEL NODE BIOPSY Left 01/16/2015   Procedure: SENTINEL NODE BIOPSY;  Surgeon: [name], MD;  Location: [facility];  Service: General;  Laterality: Left;  . TOTAL HIP ARTHROPLASTY Right 09/04/2015   Procedure: RIGHT TOTAL HIP ARTHROPLASTY ANTERIOR APPROACH;  Surgeon: [name], MD;  [facility];  Service: Orthopedics;  Laterality: Right;    FAMILY HISTORY :   Family History  Problem Relation Age of Onset  . Breast cancer Sister 46  . Addison's disease Mother   . Hyperthyroidism Mother   . Osteoarthritis Mother   . Stroke Father   . Heart disease Father   . High blood pressure Father     SOCIAL HISTORY:   Social History   Tobacco Use  . Smoking status: Former Smoker    Packs/day: 0.25    Years: 5.00    Pack years: 1.25    Types: Cigarettes    Quit date: 07/28/1972    Years since quitting: 46.7  . Smokeless tobacco: Never Used  Substance Use Topics  . Alcohol use: Yes    Alcohol/week: 1.0 - 2.0 standard drinks    Types: 1 - 2 Glasses of wine per week    Comment: 1 Glass Wine / Night  . Drug use: No    ALLERGIES:  is allergic to donepezil.  MEDICATIONS:  Current Outpatient Medications  Medication Sig Dispense Refill  . acetaminophen (TYLENOL) 325 MG tablet Take 650 mg by mouth every 4 (four) hours  as needed. Pain / increased temp.    . bimatoprost (LUMIGAN) 0.01 % SOLN Lumigan 0.01 % eye drops  INT 1 GTT IN OS HS    . Calcium Carbonate-Vitamin D (CALTRATE 600+D PO) Take 1 tablet by mouth daily.    . celecoxib (CELEBREX) 200 MG capsule Take 200 mg by mouth daily.     . Cetirizine HCl (ZYRTEC ALLERGY PO) Take 1 tablet by mouth as needed (allergies).    Marland Kitchen levETIRAcetam (KEPPRA) 250 MG tablet 1 tablet daily at bedtime  5  . traMADol (ULTRAM) 50 MG tablet Take 1 tablet (50 mg total) by mouth 2 (two) times daily as needed for moderate pain. 60 tablet 0  . vitamin E 400 UNIT capsule Take 400 Units by mouth daily.     . Cholecalciferol (VITAMIN D-3) 5000 units TABS Take 1 tablet by  mouth daily.    . naproxen sodium (ALEVE) 220 MG tablet Take 220 mg by mouth daily as needed (pain).    . prochlorperazine (COMPAZINE) 10 MG tablet Take 1 tablet (10 mg total) every 6 (six) hours as needed by mouth for nausea or vomiting. (Patient not taking: Reported on 04/12/2019) 40 tablet 1   No current facility-administered medications for this visit.    Facility-Administered Medications Ordered in Other Visits  Medication Dose Route Frequency Provider Last Rate Last Dose  . heparin lock flush 100 unit/mL  500 Units Intracatheter Once PRN Cammie Sickle, MD      . pertuzumab (PERJETA) 420 mg in sodium chloride 0.9 % 250 mL chemo infusion  420 mg Intravenous Once Charlaine Dalton R, MD      . sodium chloride flush (NS) 0.9 % injection 10 mL  10 mL Intravenous PRN Cammie Sickle, MD   10 mL at 07/07/18 0830  . trastuzumab (HERCEPTIN) 450 mg in sodium chloride 0.9 % 250 mL chemo infusion  450 mg Intravenous Once Cammie Sickle, MD        PHYSICAL EXAMINATION: ECOG PERFORMANCE STATUS: 1 - Symptomatic but completely ambulatory  BP (!) 115/53   Pulse 73   Temp (!) 96.4 F (35.8 C) (Tympanic)   Resp 16   Wt 167 lb (75.8 kg)   BMI 29.58 kg/m   Filed Weights   04/13/19 0858  Weight:  167 lb (75.8 kg)    Physical Exam  Constitutional: She is oriented to person, place, and time and well-developed, well-nourished, and in no distress.  Patient is alone.  In a wheelchair.  HENT:  Head: Normocephalic and atraumatic.  Mouth/Throat: Oropharynx is clear and moist. No oropharyngeal exudate.  Eyes: Pupils are equal, round, and reactive to light.  Neck: Normal range of motion. Neck supple.  Cardiovascular: Normal rate and regular rhythm.  Pulmonary/Chest: No respiratory distress. She has no wheezes.  Abdominal: Soft. Bowel sounds are normal. She exhibits no distension and no mass. There is no abdominal tenderness. There is no rebound and no guarding.  Musculoskeletal: Normal range of motion.        General: No tenderness or edema.  Neurological: She is alert and oriented to person, place, and time.  Skin: Skin is warm.  Psychiatric: Affect normal.    LABORATORY DATA:  I have reviewed the data as listed    Component Value Date/Time   NA 135 04/13/2019 0817   K 3.6 04/13/2019 0817   CL 102 04/13/2019 0817   CO2 25 04/13/2019 0817   GLUCOSE 180 (H) 04/13/2019 0817   BUN 14 04/13/2019 0817   CREATININE 0.67 04/13/2019 0817   CALCIUM 8.7 (L) 04/13/2019 0817   PROT 6.8 04/13/2019 0817   ALBUMIN 4.1 04/13/2019 0817   AST 28 04/13/2019 0817   ALT 27 04/13/2019 0817   ALKPHOS 60 04/13/2019 0817   BILITOT 0.6 04/13/2019 0817   GFRNONAA >60 04/13/2019 0817   GFRAA >60 04/13/2019 0817    No results found for: SPEP, UPEP  Lab Results  Component Value Date   WBC 5.3 04/13/2019   NEUTROABS 3.9 04/13/2019   HGB 14.1 04/13/2019   HCT 43.6 04/13/2019   MCV 93.2 04/13/2019   PLT 286 04/13/2019      Chemistry      Component Value Date/Time   NA 135 04/13/2019 0817   K 3.6 04/13/2019 0817   CL 102 04/13/2019 0817   CO2 25 04/13/2019 0817  BUN 14 04/13/2019 0817   CREATININE 0.67 04/13/2019 0817      Component Value Date/Time   CALCIUM 8.7 (L) 04/13/2019 0817    ALKPHOS 60 04/13/2019 0817   AST 28 04/13/2019 0817   ALT 27 04/13/2019 0817   BILITOT 0.6 04/13/2019 0817       RADIOGRAPHIC STUDIES: I have personally reviewed the radiological images as listed and agreed with the findings in the report. No results found.   ASSESSMENT & PLAN:  Carcinoma of overlapping sites of left breast in female, estrogen receptor negative ([place]) #Metastatic breast cancer ER [name] HER-2/neu positive.  June 2020- CT scan chest and pelvis NED/stable sclerosis of the left manubrium. Stable.  #Proceed with Herceptin Perjeta.  Labs today reviewed;  acceptable for treatment today. Will order scans at next visit  #Drop in ejection fraction-baseline 68%;January 27 2019 improved at ~60% ejection fraction-we will again repeat in October 2020.  Stable; order at next visit.  #Chronic back and hip pain- STABLE;   on asper patch/ tramadol prn.  #Left breast/incisional pain-STABLE;   # Dementia/ chronic debility-STABLE.   # I spoke at length with the patient's family- regarding the patient's clinical status/plan of care- discussed re: vaccines/ colonoscopy.   Family agreement.   DISPOSITION:  #Herceptin-Perjeta today # Follow up in 3 weeks-MD;labs-cbc/cmp; Herceptin-Perjeta- Dr.B    No orders of the defined types were placed in this encounter.  All questions were answered. The patient knows to call the clinic with any problems, questions or concerns.      Cammie Sickle, MD 04/13/2019 9:53 AM

## 2019-04-13 NOTE — Assessment & Plan Note (Addendum)
#  Metastatic breast cancer ER [name] HER-2/neu positive.  June 2020- CT scan chest and pelvis NED/stable sclerosis of the left manubrium. Stable.  #Proceed with Herceptin Perjeta.  Labs today reviewed;  acceptable for treatment today. Will order scans at next visit  #Drop in ejection fraction-baseline 68%;January 27 2019 improved at ~60% ejection fraction-we will again repeat in October 2020.  Stable; order at next visit.  #Chronic back and hip pain- STABLE;   on asper patch/ tramadol prn.  #Left breast/incisional pain-STABLE;   # Dementia/ chronic debility-STABLE.   # I spoke at length with the patient's family- regarding the patient's clinical status/plan of care- discussed re: vaccines/ colonoscopy.   Family agreement.   DISPOSITION:  #Herceptin-Perjeta today # Follow up in 3 weeks-MD;labs-cbc/cmp; Herceptin-Perjeta- Dr.B

## 2019-05-03 ENCOUNTER — Encounter: Payer: Self-pay | Admitting: Internal Medicine

## 2019-05-03 ENCOUNTER — Other Ambulatory Visit: Payer: Self-pay

## 2019-05-04 ENCOUNTER — Other Ambulatory Visit: Payer: Self-pay

## 2019-05-04 ENCOUNTER — Inpatient Hospital Stay (HOSPITAL_BASED_OUTPATIENT_CLINIC_OR_DEPARTMENT_OTHER): Payer: Medicare Other | Admitting: Internal Medicine

## 2019-05-04 ENCOUNTER — Inpatient Hospital Stay: Payer: Medicare Other | Attending: Internal Medicine

## 2019-05-04 ENCOUNTER — Inpatient Hospital Stay: Payer: Medicare Other

## 2019-05-04 DIAGNOSIS — C50812 Malignant neoplasm of overlapping sites of left female breast: Secondary | ICD-10-CM | POA: Diagnosis not present

## 2019-05-04 DIAGNOSIS — Z5112 Encounter for antineoplastic immunotherapy: Secondary | ICD-10-CM | POA: Insufficient documentation

## 2019-05-04 DIAGNOSIS — M549 Dorsalgia, unspecified: Secondary | ICD-10-CM | POA: Diagnosis not present

## 2019-05-04 DIAGNOSIS — F0390 Unspecified dementia without behavioral disturbance: Secondary | ICD-10-CM | POA: Insufficient documentation

## 2019-05-04 DIAGNOSIS — Z23 Encounter for immunization: Secondary | ICD-10-CM | POA: Insufficient documentation

## 2019-05-04 DIAGNOSIS — C50912 Malignant neoplasm of unspecified site of left female breast: Secondary | ICD-10-CM

## 2019-05-04 DIAGNOSIS — G8929 Other chronic pain: Secondary | ICD-10-CM | POA: Diagnosis not present

## 2019-05-04 DIAGNOSIS — Z171 Estrogen receptor negative status [ER-]: Secondary | ICD-10-CM | POA: Diagnosis not present

## 2019-05-04 DIAGNOSIS — C7951 Secondary malignant neoplasm of bone: Secondary | ICD-10-CM | POA: Diagnosis not present

## 2019-05-04 DIAGNOSIS — C773 Secondary and unspecified malignant neoplasm of axilla and upper limb lymph nodes: Secondary | ICD-10-CM

## 2019-05-04 LAB — CBC WITH DIFFERENTIAL/PLATELET
Abs Immature Granulocytes: 0.03 10*3/uL (ref 0.00–0.07)
Basophils Absolute: 0 10*3/uL (ref 0.0–0.1)
Basophils Relative: 1 %
Eosinophils Absolute: 0.2 10*3/uL (ref 0.0–0.5)
Eosinophils Relative: 4 %
HCT: 43.9 % (ref 36.0–46.0)
Hemoglobin: 14.2 g/dL (ref 12.0–15.0)
Immature Granulocytes: 1 %
Lymphocytes Relative: 20 %
Lymphs Abs: 1.1 10*3/uL (ref 0.7–4.0)
MCH: 30 pg (ref 26.0–34.0)
MCHC: 32.3 g/dL (ref 30.0–36.0)
MCV: 92.6 fL (ref 80.0–100.0)
Monocytes Absolute: 0.3 10*3/uL (ref 0.1–1.0)
Monocytes Relative: 6 %
Neutro Abs: 3.8 10*3/uL (ref 1.7–7.7)
Neutrophils Relative %: 68 %
Platelets: 312 10*3/uL (ref 150–400)
RBC: 4.74 MIL/uL (ref 3.87–5.11)
RDW: 12.7 % (ref 11.5–15.5)
WBC: 5.4 10*3/uL (ref 4.0–10.5)
nRBC: 0 % (ref 0.0–0.2)

## 2019-05-04 LAB — COMPREHENSIVE METABOLIC PANEL
ALT: 21 U/L (ref 0–44)
AST: 27 U/L (ref 15–41)
Albumin: 4.1 g/dL (ref 3.5–5.0)
Alkaline Phosphatase: 63 U/L (ref 38–126)
Anion gap: 8 (ref 5–15)
BUN: 15 mg/dL (ref 8–23)
CO2: 27 mmol/L (ref 22–32)
Calcium: 8.7 mg/dL — ABNORMAL LOW (ref 8.9–10.3)
Chloride: 102 mmol/L (ref 98–111)
Creatinine, Ser: 0.74 mg/dL (ref 0.44–1.00)
GFR calc Af Amer: >60 mL/min (ref >60)
GFR calc non Af Amer: >60 mL/min (ref >60)
Glucose, Bld: 165 mg/dL — ABNORMAL HIGH (ref 70–99)
Potassium: 3.7 mmol/L (ref 3.5–5.1)
Sodium: 137 mmol/L (ref 135–145)
Total Bilirubin: 0.6 mg/dL (ref 0.3–1.2)
Total Protein: 7 g/dL (ref 6.5–8.1)

## 2019-05-04 MED ORDER — DIPHENHYDRAMINE HCL 50 MG/ML IJ SOLN
12.5000 mg | Freq: Once | INTRAMUSCULAR | Status: AC
  Administered 2019-05-04: 10:00:00 12.5 mg via INTRAVENOUS
  Filled 2019-05-04: qty 1

## 2019-05-04 MED ORDER — HEPARIN SOD (PORK) LOCK FLUSH 100 UNIT/ML IV SOLN
500.0000 [IU] | Freq: Once | INTRAVENOUS | Status: AC | PRN
  Administered 2019-05-04: 500 [IU]
  Filled 2019-05-04: qty 5

## 2019-05-04 MED ORDER — ACETAMINOPHEN 325 MG PO TABS
650.0000 mg | ORAL_TABLET | Freq: Once | ORAL | Status: AC
  Administered 2019-05-04: 10:00:00 650 mg via ORAL
  Filled 2019-05-04: qty 2

## 2019-05-04 MED ORDER — SODIUM CHLORIDE 0.9 % IV SOLN
420.0000 mg | Freq: Once | INTRAVENOUS | Status: AC
  Administered 2019-05-04: 420 mg via INTRAVENOUS
  Filled 2019-05-04: qty 14

## 2019-05-04 MED ORDER — SODIUM CHLORIDE 0.9 % IV SOLN
Freq: Once | INTRAVENOUS | Status: AC
  Administered 2019-05-04: 10:00:00 via INTRAVENOUS
  Filled 2019-05-04: qty 250

## 2019-05-04 MED ORDER — INFLUENZA VAC A&B SA ADJ QUAD 0.5 ML IM PRSY
0.5000 mL | PREFILLED_SYRINGE | Freq: Once | INTRAMUSCULAR | Status: AC
  Administered 2019-05-04: 0.5 mL via INTRAMUSCULAR
  Filled 2019-05-04: qty 0.5

## 2019-05-04 MED ORDER — TRASTUZUMAB-DKST CHEMO 150 MG IV SOLR
450.0000 mg | Freq: Once | INTRAVENOUS | Status: AC
  Administered 2019-05-04: 11:00:00 450 mg via INTRAVENOUS
  Filled 2019-05-04: qty 21.43

## 2019-05-04 NOTE — Progress Notes (Signed)
[name] OFFICE PROGRESS NOTE  Patient Care Team: [name], [name], MD (General Surgery) Requested, [name]   Oncology History Overview Note  # June 2016- LEFT BREAST CA;  [name] ; [name] ; [name]; Her 2 Neu POS; [place] from July OF 2016; [name]; adjuvant Herceptin [ Finished July 2017]; AUG 2017- Neratinib x5 days; DISCON sec to diarrhea  # MID OCT 2018- Right breast mass-Bx- ER/PR-NEG; Her 2 NEU POSITIVE 1~2.5cm;  [?NEW primary]  # MID-OCT 2018-METASTATIC RECURRENT-oh sternal mass; Left Ax LN [Bx]/periportal LN  # OCT 25th 2018- TAXOL-HERCEPTIN-PERJETA; Jan 2019- CT PR; continue HP only; on HOLD since NOV 2019--sec to drop in EF/Sieziues  # July 15th 2020- RE-STARTED HP   -------------------------------------------------------------------  # MUGA scan- July 28th 2017- 67%.  December 2019-EF 53%; January 2020 EF-52%  # chronic gait/balance issues  # Seizures/petit-mal/ Dr.Shah-[name]]  # June 2017- left breast Bx- fat necrosis [[name]  # july 2017-  BRCA 1& 2- NEG.   MOLECULAR TESTING- F ONE- TPS- 0%;  ERB2 amplification; PI3K/RET amplification Others**  --------------------------------------------------    DIAGNOSIS: [ OCT 2018]- REC/MET- BREAST CA ER/PR-NEG; her 2 POS  STAGE: 4  ;GOALS: Palliative  CURRENT/MOST RECENT THERAPY- Herceptin-Perjeta    Carcinoma of overlapping sites of left breast in female, estrogen receptor negative ([place])      INTERVAL HISTORY: Patient a poor historian given dementia.  Patient's husband unavailable over the phone.   [name] 78 y.o.  female pleasant patient above history of metastatic breast cancer on Herceptin plus Perjeta-is here for follow-up.  Continues to chronic back pain hip pain.  Otherwise  denies any new lumps or bumps.  Chronic pain in the left axilla.  However as per family patient has been more sleepy/more fatigued.  However, no focal symptoms noted no falls.  Denies any headaches.  No swelling in the legs.  No shortness of breath.  No orthopnea.  Review of Systems  Unable to perform ROS: Dementia     PAST MEDICAL HISTORY :  Past Medical History:  Diagnosis Date  . Arthritis   . 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 Breast cancer ([place]) 2018   left breast; surgery 01/11/15 with Dr. Bary Castilla; path with invasive mammary and DCIS  . Breast cancer of upper-inner quadrant of left female breast ([place]) 12/15/14   Completed radiation end of December and finished chemotherapy 2 weeks ago, Left breast invasive mammary carcinoma, T1cN30mc (1.5 cm); Grade 3, [name], [name], [name], .  .Marland KitchenCataract    bilat   . DDD (degenerative disc disease), lumbar    Lumbar, previously evaluated by Dr. [place]arnestine Leys . Dementia ([place]   . Fibrocystic breast disease    prior biopsy  . Glaucoma   . 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 Hearing loss   . History of cancer chemotherapy   . History of radiation therapy   . Hyperlipidemia, unspecified   . Imbalance   . Memory impairment    seen by Dr SManuella Ghazi possible post crainiotomy from radiation  . [place]    Left cavernous [facility]  . Numbness and tingling  right hand   . Osteoarthritis   . Osteoporosis, post-menopausal   . Personal history of chemotherapy   . Personal history of radiation therapy   . Shoulder pain, right   . Wears glasses     PAST SURGICAL HISTORY :   Past Surgical History:  Procedure Laterality Date  . APPENDECTOMY  1950  . BRAIN SURGERY  1995   left frontal/temporal  . BREAST BIOPSY Left 12/15/14   confirmed DCIS  . BREAST BIOPSY Left 01/08/2015   Procedure: BREAST BIOPSY WITH NEEDLE  LOCALIZATION;  Surgeon: [name], MD;  Location: [facility] ORS;  Service: General;  Laterality: Left;  . BREAST EXCISIONAL BIOPSY Left 1997  . BREAST LUMPECTOMY Left 01/08/2015   Procedure: LUMPECTOMY;  Surgeon: [name], MD;  Location: [facility] OR[facility];  Service: General;  Laterality: Left;  . COLONOSCOPY  2010   Dr. [name]  . ORIF ANKLE FRACTURE Right 08/05/2017   Procedure: OPEN REDUCTION INTERNAL FIXATION (ORIF) ANKLE FRACTURE;  Surgeon: Earnestine Leys, MD;  Location: [facility] OR[facility];  Service: Orthopedics;  Laterality: Right;  . PORTACATH PLACEMENT Right 01/16/2015   Procedure: INSERTION PORT-A-CATH;  Surgeon: [name], MD;  Location: [facility] ORS;  Service: General;  Laterality: Right;  . SENTINEL NODE BIOPSY Left 01/16/2015   Procedure: SENTINEL NODE BIOPSY;  Surgeon: [name], MD;  Location: [facility] OR[facility];  Service: General;  Laterality: Left;  . TOTAL HIP ARTHROPLASTY Right 09/04/2015   Procedure: RIGHT TOTAL HIP ARTHROPLASTY ANTERIOR APPROACH;  Surgeon: [name], MD;  Location: [facility];  Service: Orthopedics;  Laterality: Right;    FAMILY HISTORY :   Family History  Problem Relation Age of Onset  . Breast cancer Sister 44  . Addison's disease Mother   . Hyperthyroidism Mother   . Osteoarthritis Mother   . Stroke Father   . Heart disease Father   . High blood pressure Father     SOCIAL HISTORY:   Social History   Tobacco Use  . Smoking status: Former Smoker    Packs/day: 0.25    Years: 5.00    Pack years: 1.25    Types: Cigarettes    Quit date: 07/28/1972    Years since quitting: 46.7  . Smokeless tobacco: Never Used  Substance Use Topics  . Alcohol use: Yes    Alcohol/week: 1.0 - 2.0 standard drinks    Types: 1 - 2 Glasses of wine per week    Comment: 1 Glass Wine / Night  . Drug use: No    ALLERGIES:  is allergic to donepezil.  MEDICATIONS:  Current Outpatient Medications  Medication Sig Dispense Refill  . acetaminophen (TYLENOL) 325 MG tablet Take 650  mg by mouth every 4 (four) hours as needed. Pain / increased temp.    . bimatoprost (LUMIGAN) 0.01 % SOLN Lumigan 0.01 % eye drops  INT 1 GTT IN OS HS    . Calcium Carbonate-Vitamin D (CALTRATE 600+D PO) Take 1 tablet by mouth daily.    . celecoxib (CELEBREX) 200 MG capsule Take 200 mg by mouth daily.     . Cetirizine HCl (ZYRTEC ALLERGY PO) Take 1 tablet by mouth as needed (allergies).    . Cholecalciferol (VITAMIN D-3) 5000 units TABS Take 1 tablet by mouth daily.    Marland Kitchen levETIRAcetam (KEPPRA) 250 MG tablet 1 tablet daily at bedtime  5  . naproxen sodium (ALEVE) 220 MG tablet Take 220 mg by mouth daily as needed (pain).    . prochlorperazine (COMPAZINE) 10 MG  tablet Take 1 tablet (10 mg total) every 6 (six) hours as needed by mouth for nausea or vomiting. 40 tablet 1  . traMADol (ULTRAM) 50 MG tablet Take 1 tablet (50 mg total) by mouth 2 (two) times daily as needed for moderate pain. 60 tablet 0  . vitamin E 400 UNIT capsule Take 400 Units by mouth daily.      No current facility-administered medications for this visit.    Facility-Administered Medications Ordered in Other Visits  Medication Dose Route Frequency Provider Last Rate Last Dose  . sodium chloride flush (NS) 0.9 % injection 10 mL  10 mL Intravenous PRN Cammie Sickle, MD   10 mL at 07/07/18 0830    PHYSICAL EXAMINATION: ECOG PERFORMANCE STATUS: 1 - Symptomatic but completely ambulatory  BP 125/66   Pulse 78   Temp (!) 97 F (36.1 C) (Tympanic)   Resp 20   Ht '5\' 3"'$  (1.6 m)   Wt 167 lb (75.8 kg)   BMI 29.58 kg/m   Filed Weights   05/04/19 0855  Weight: 167 lb (75.8 kg)   Lifestyle  Physical activity  . Days per week: Not on file  . Minutes per session: Not on file     LABORATORY DATA:  I have reviewed the data as listed    Component Value Date/Time   NA 137 05/04/2019 0837   K 3.7 05/04/2019 0837   CL 102 05/04/2019 0837   CO2 27 05/04/2019 0837   GLUCOSE 165 (H) 05/04/2019 0837   BUN 15  05/04/2019 0837   CREATININE 0.74 05/04/2019 0837   CALCIUM 8.7 (L) 05/04/2019 0837   PROT 7.0 05/04/2019 0837   ALBUMIN 4.1 05/04/2019 0837   AST 27 05/04/2019 0837   ALT 21 05/04/2019 0837   ALKPHOS 63 05/04/2019 0837   BILITOT 0.6 05/04/2019 0837   GFRNONAA >60 05/04/2019 0837   GFRAA >60 05/04/2019 0837    No results found for: SPEP, UPEP  Lab Results  Component Value Date   WBC 5.4 05/04/2019   NEUTROABS 3.8 05/04/2019   HGB 14.2 05/04/2019   HCT 43.9 05/04/2019   MCV 92.6 05/04/2019   PLT 312 05/04/2019      Chemistry      Component Value Date/Time   NA 137 05/04/2019 0837   K 3.7 05/04/2019 0837   CL 102 05/04/2019 0837   CO2 27 05/04/2019 0837   BUN 15 05/04/2019 0837   CREATININE 0.74 05/04/2019 0837      Component Value Date/Time   CALCIUM 8.7 (L) 05/04/2019 0837   ALKPHOS 63 05/04/2019 0837   AST 27 05/04/2019 0837   ALT 21 05/04/2019 0837   BILITOT 0.6 05/04/2019 0837       Physical Exam  Constitutional: She is oriented to person, place, and time and well-developed, well-nourished, and in no distress.  Patient is alone.  In a wheelchair.  HENT:  Head: Normocephalic and atraumatic.  Mouth/Throat: Oropharynx is clear and moist. No oropharyngeal exudate.  Eyes: Pupils are equal, round, and reactive to light.  Neck: Normal range of motion. Neck supple.  Cardiovascular: Normal rate and regular rhythm.  Pulmonary/Chest: No respiratory distress. She has no wheezes.  Abdominal: Soft. Bowel sounds are normal. She exhibits no distension and no mass. There is no abdominal tenderness. There is no rebound and no guarding.  Musculoskeletal: Normal range of motion.        General: No tenderness or edema.  Neurological: She is alert and oriented to person,  place, and time.  Skin: Skin is warm.  Psychiatric: Affect normal.    LABORATORY DATA:  I have reviewed the data as listed    Component Value Date/Time   NA 137 05/04/2019 0837   K 3.7 05/04/2019 0837    CL 102 05/04/2019 0837   CO2 27 05/04/2019 0837   GLUCOSE 165 (H) 05/04/2019 0837   BUN 15 05/04/2019 0837   CREATININE 0.74 05/04/2019 0837   CALCIUM 8.7 (L) 05/04/2019 0837   PROT 7.0 05/04/2019 0837   ALBUMIN 4.1 05/04/2019 0837   AST 27 05/04/2019 0837   ALT 21 05/04/2019 0837   ALKPHOS 63 05/04/2019 0837   BILITOT 0.6 05/04/2019 0837   GFRNONAA >60 05/04/2019 0837   GFRAA >60 05/04/2019 0837    No results found for: SPEP, UPEP  Lab Results  Component Value Date   WBC 5.4 05/04/2019   NEUTROABS 3.8 05/04/2019   HGB 14.2 05/04/2019   HCT 43.9 05/04/2019   MCV 92.6 05/04/2019   PLT 312 05/04/2019      Chemistry      Component Value Date/Time   NA 137 05/04/2019 0837   K 3.7 05/04/2019 0837   CL 102 05/04/2019 0837   CO2 27 05/04/2019 0837   BUN 15 05/04/2019 0837   CREATININE 0.74 05/04/2019 0837      Component Value Date/Time   CALCIUM 8.7 (L) 05/04/2019 0837   ALKPHOS 63 05/04/2019 0837   AST 27 05/04/2019 0837   ALT 21 05/04/2019 0837   BILITOT 0.6 05/04/2019 0837       RADIOGRAPHIC STUDIES: I have personally reviewed the radiological images as listed and agreed with the findings in the report. No results found.   ASSESSMENT & PLAN:  Carcinoma of overlapping sites of left breast in female, estrogen receptor negative ([place]) #Metastatic breast cancer ER [name] HER-2/neu positive.  June 2020- CT scan chest and pelvis NED/stable sclerosis of the left manubrium. Stable.   #Proceed with Herceptin Perjeta.  Labs today reviewed; acceptable for treatment today. Will order scans at next visit  #Drop in ejection fraction-baseline 68%;January 27 2019 improved at ~60% ejection fraction-we will again repeat in October 2020.  Stable. Order at next visit.  #Chronic back and hip pain- stable  on asper patch/ tramadol prn.  #Left breast/incisional pain-stable.   # Dementia/ chronic debility-increasing drowsiness? 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   # Flu shot- today.   DISPOSITION:  #Herceptin-Perjeta today # flu shot # Follow up in 3 weeks-MD;labs-cbc/cmp; Herceptin-Perjeta/ CT C/A/P- Dr.B    Orders Placed This Encounter  Procedures  . CT CHEST W CONTRAST    Standing Status:   Future    Standing Expiration Date:   05/03/2020    Order Specific Question:   If indicated for the ordered procedure, I authorize the administration of contrast media per Radiology protocol    Answer:   Yes    Order Specific Question:   Preferred imaging location?    Answer:    [facility]    Order Specific Question:   Radiology Contrast Protocol - do NOT remove file path    Answer:   [email or web address]    Order Specific Question:   ** REASON FOR EXAM (FREE TEXT)    Answer:   breast cancer metastatic  . CT ABDOMEN PELVIS W CONTRAST    Standing Status:   Future    Standing Expiration Date:   05/03/2020    Order  Specific Question:   If indicated for the ordered procedure, I authorize the administration of contrast media per Radiology protocol    Answer:   Yes    Order Specific Question:   Preferred imaging location?    Answer:   [facility]    Order Specific Question:   Radiology Contrast Protocol - do NOT remove file path    Answer:   [email or web address]    Order Specific Question:   ** REASON FOR EXAM (FREE TEXT)    Answer:   breast cancer metastatic  . CBC with Differential    Standing Status:   Future    Standing Expiration Date:   05/03/2020  . Comprehensive metabolic panel    Standing Status:   Future    Standing Expiration Date:   05/03/2020   All questions were answered. The patient knows to call the clinic with any problems, questions or concerns.      Cammie Sickle, MD 05/04/2019 1:56 PM

## 2019-05-04 NOTE — Assessment & Plan Note (Addendum)
#  Metastatic breast cancer ER [name] HER-2/neu positive.  June 2020- CT scan chest and pelvis NED/stable sclerosis of the left manubrium.  Clinically stable.  #Proceed with Herceptin Perjeta.  Labs today reviewed; acceptable for treatment today.  We will get a CT scan chest and pelvis prior to next visit.  #Drop in ejection fraction-baseline 68%;January 27 2019 improved at ~60% ejection fraction-we will again repeat in October 2020.  Stable.  Order at next visit.  #Chronic back and hip pain-stable.  On asper patch/ tramadol prn.  #Left breast/incisional pain-stable  # Dementia/ chronic debility-increasing drowsiness? Etiology- awaiting evaluation with Dr.Shah/ would recommend MRI Brain given her-2 positive disease patient is high risk for recurrence of brain.  Patient's husband will reach out to Dr. Brigitte Pulse.  # Flu shot- today.   I spoke at length with the patient's family/husband- regarding the patient's clinical status/plan of care.  Family agreement.   DISPOSITION:  #Herceptin-Perjeta today # flu shot # Follow up in 3 weeks-MD;labs-cbc/cmp; Herceptin-Perjeta/ CT C/A/P- Dr.B

## 2019-05-22 NOTE — Progress Notes (Signed)
[place]  [phone] [phone]  ID: [name] OB: 02-Nov-1940  MR#: [id number]  AES#:[id number]  Patient Care Team: [name], [name], MD (General Surgery) Requested, Self  CHIEF COMPLAINT: Carcinoma of overlapping sites of left breast in female, estrogen receptor negative.  INTERVAL HISTORY: Patient returns to clinic today for further evaluation and her next infusion of Perjeta and Herceptin.  She has underlying dementia and is a poor historian.  Attempts to call her husband were unsuccessful.  Patient currently feels well.  She continues to have chronic back pain.  She denies any recent fevers or illnesses.  She has no chest pain, shortness of breath, cough, or hemoptysis.  She denies any nausea, vomiting, constipation, or diarrhea.  She has no urinary complaints.  Patient offers no specific complaints today.  REVIEW OF SYSTEMS:   Review of Systems  Constitutional: Positive for malaise/fatigue. Negative for fever and weight loss.  Respiratory: Negative.  Negative for cough, hemoptysis and shortness of breath.   Cardiovascular: Negative.  Negative for chest pain and leg swelling.  Gastrointestinal: Negative.  Negative for abdominal pain.  Genitourinary: Negative.  Negative for dysuria.  Musculoskeletal: Positive for back pain.  Skin: Negative.  Negative for rash.  Neurological: Positive for weakness. Negative for dizziness, focal weakness and headaches.  Psychiatric/Behavioral: Positive for memory loss. The patient is not nervous/anxious.     As per HPI. Otherwise, a complete review of systems is negative.  PAST MEDICAL HISTORY: Past Medical History:  Diagnosis Date   Arthritis    Brain tumor ([place]) 1995   meningeoma   Breast cancer ([place]) 2018   left breast; surgery 01/11/15 with Dr. Bary Castilla; path with invasive mammary and DCIS   Breast cancer of upper-inner quadrant  of left female breast ([place]) 12/15/14   Completed radiation end of December and finished chemotherapy 2 weeks ago, Left breast invasive mammary carcinoma, T1cN9mc (1.5 cm); Grade 3, [name], [name], [name], .   Cataract    bilat    DDD (degenerative disc disease), lumbar    Lumbar, previously evaluated by Dr. HEarnestine Leys  Dementia ([facility]    Fibrocystic breast disease    prior biopsy   Glaucoma    Hard of hearing    wears hearing aides bilat    Hearing loss    History of cancer chemotherapy    History of radiation therapy    Hyperlipidemia, unspecified    Imbalance    Memory impairment    seen by Dr SManuella Ghazi possible post crainiotomy from radiation   Meningioma ([facility]    Left cavernous sinus meningioma, [facility].   Numbness and tingling    right hand    Osteoarthritis    Osteoporosis, post-menopausal    Personal history of chemotherapy    Personal history of radiation therapy    Shoulder pain, right    Wears glasses     PAST SURGICAL HISTORY: Past Surgical History:  Procedure Laterality Date   [place]  left frontal/temporal   BREAST BIOPSY Left 12/15/14   confirmed DCIS   BREAST BIOPSY Left 01/08/2015   Procedure: BREAST BIOPSY WITH NEEDLE LOCALIZATION;  Surgeon: [name] MD;  Location: [facility] ORS;  Service: General;  Laterality: Left;   BREAST EXCISIONAL BIOPSY Left 1997   BREAST  LUMPECTOMY Left 01/08/2015   Procedure: LUMPECTOMY;  Surgeon: [name], MD;  Location: [facility] ORS;  Service: General;  Laterality: Left;   COLONOSCOPY  2010   Dr. [name]   ORIF ANKLE FRACTURE Right 08/05/2017   Procedure: OPEN REDUCTION INTERNAL FIXATION (ORIF) ANKLE FRACTURE;  Surgeon: Earnestine Leys, MD;  Location: [facility] ORS;  Service: Orthopedics;  Laterality: Right;   PORTACATH PLACEMENT Right 01/16/2015   Procedure: INSERTION  PORT-A-CATH;  Surgeon: [name], MD;  Location: [facility] ORS;  Service: General;  Laterality: Right;   SENTINEL NODE BIOPSY Left 01/16/2015   Procedure: SENTINEL NODE BIOPSY;  Surgeon: [name], MD;  Location: [facility] ORS;  Service: General;  Laterality: Left;   TOTAL HIP ARTHROPLASTY Right 09/04/2015   Procedure: RIGHT TOTAL HIP ARTHROPLASTY ANTERIOR APPROACH;  Surgeon: [name], MD;  Location: [facility] ORS;  Service: Orthopedics;  Laterality: Right;    FAMILY HISTORY: Family History  Problem Relation Age of Onset   Breast cancer Sister 66   Addison's disease Mother    Hyperthyroidism Mother    Osteoarthritis Mother    Stroke Father    Heart disease Father    High blood pressure Father     ADVANCED DIRECTIVES (Y/N):  N  HEALTH MAINTENANCE: Social History   Tobacco Use   Smoking status: Former Smoker    Packs/day: 0.25    Years: 5.00    Pack years: 1.25    Types: Cigarettes    Quit date: 07/28/1972    Years since quitting: 46.8   Smokeless tobacco: Never Used  Substance Use Topics   Alcohol use: Yes    Alcohol/week: 1.0 - 2.0 standard drinks    Types: 1 - 2 Glasses of wine per week    Comment: 1 Glass Wine / Night   Drug use: No     Colonoscopy:  PAP:  Bone density:  Lipid panel:  Allergies  Allergen Reactions   Donepezil     Other reaction(s): Other (See Comments) Nightmares    Current Outpatient Medications  Medication Sig Dispense Refill   acetaminophen (TYLENOL) 325 MG tablet Take 650 mg by mouth every 4 (four) hours as needed. Pain / increased temp.     bimatoprost (LUMIGAN) 0.01 % SOLN Lumigan 0.01 % eye drops  INT 1 GTT IN OS HS     Calcium Carbonate-Vitamin D (CALTRATE 600+D PO) Take 1 tablet by mouth daily.     celecoxib (CELEBREX) 200 MG capsule Take 200 mg by mouth daily.      Cetirizine HCl (ZYRTEC ALLERGY PO) Take 1 tablet by mouth as needed (allergies).     Cholecalciferol (VITAMIN D-3) 5000 units TABS Take 1 tablet by  mouth daily.     levETIRAcetam (KEPPRA) 250 MG tablet 1 tablet daily at bedtime  5   naproxen sodium (ALEVE) 220 MG tablet Take 220 mg by mouth daily as needed (pain).     prochlorperazine (COMPAZINE) 10 MG tablet Take 1 tablet (10 mg total) every 6 (six) hours as needed by mouth for nausea or vomiting. 40 tablet 1   traMADol (ULTRAM) 50 MG tablet Take 1 tablet (50 mg total) by mouth 2 (two) times daily as needed for moderate pain. 60 tablet 0   vitamin E 400 UNIT capsule Take 400 Units by mouth daily.      No current facility-administered medications for this visit.    Facility-Administered Medications Ordered in Other Visits  Medication Dose Route Frequency Provider Last Rate Last Dose   sodium  chloride flush (NS) 0.9 % injection 10 mL  10 mL Intravenous PRN Charlaine Dalton R, MD   10 mL at 07/07/18 0830    OBJECTIVE: Vitals:   05/25/19 0846  BP: 109/70  Pulse: 80  Resp: 20  Temp: (!) 96.9 F (36.1 C)     Body mass index is 29.51 kg/m.    ECOG FS:1 - Symptomatic but completely ambulatory  General: Well-developed, well-nourished, no acute distress.  Sitting in a wheelchair. Eyes: Pink conjunctiva, anicteric sclera. HEENT: Normocephalic, moist mucous membranes. Breast: Exam deferred today. Lungs: Clear to auscultation bilaterally. Heart: Regular rate and rhythm. No rubs, murmurs, or gallops. Abdomen: Soft, nontender, nondistended. No organomegaly noted, normoactive bowel sounds. Musculoskeletal: No edema, cyanosis, or clubbing. Neuro: Alert, answering all questions appropriately. Cranial nerves grossly intact. Skin: No rashes or petechiae noted. Psych: Normal affect.   LAB RESULTS:  Lab Results  Component Value Date   NA 139 05/25/2019   K 3.7 05/25/2019   CL 103 05/25/2019   CO2 26 05/25/2019   GLUCOSE 157 (H) 05/25/2019   BUN 17 05/25/2019   CREATININE 0.76 05/25/2019   CALCIUM 8.6 (L) 05/25/2019   PROT 6.6 05/25/2019   ALBUMIN 4.1 05/25/2019   AST 23  05/25/2019   ALT 21 05/25/2019   ALKPHOS 62 05/25/2019   BILITOT 0.4 05/25/2019   GFRNONAA >60 05/25/2019   GFRAA >60 05/25/2019    Lab Results  Component Value Date   WBC 5.4 05/25/2019   NEUTROABS 3.8 05/25/2019   HGB 13.7 05/25/2019   HCT 42.0 05/25/2019   MCV 92.3 05/25/2019   PLT 259 05/25/2019     STUDIES: Ct Chest W Contrast  Result Date: 05/23/2019 CLINICAL DATA:  Recurrent metastatic left breast cancer on immunotherapy. No current symptoms reported. Restaging. EXAM: CT CHEST, ABDOMEN, AND PELVIS WITH CONTRAST TECHNIQUE: Multidetector CT imaging of the chest, abdomen and pelvis was performed following the standard protocol during bolus administration of intravenous contrast. CONTRAST:  150m OMNIPAQUE IOHEXOL 300 MG/ML  SOLN COMPARISON:  01/10/2019 CT chest, abdomen and pelvis. FINDINGS: CT CHEST FINDINGS Cardiovascular: Normal heart size. No significant pericardial effusion/thickening. Atherosclerotic nonaneurysmal thoracic aorta. Normal caliber pulmonary arteries. No central pulmonary emboli. Right subclavian Port-A-Cath terminates at the cavoatrial junction. Mediastinum/Nodes: No discrete thyroid nodules. Subcentimeter hypodense bilateral anterior thyroid nodules are stable. No pathologically enlarged axillary, mediastinal or hilar lymph nodes. Lungs/Pleura: No pneumothorax. No pleural effusion. Stable small sharply marginated focus of consolidation with mild bronchiectasis, volume loss and distortion in the anterior apical left upper lobe (series 3/image 20), compatible with postradiation change. Left lower lobe 3 mm solid pulmonary nodule (series 3/image 69), stable since 11/30/2017 CT. No acute consolidative airspace disease, lung masses or new significant pulmonary nodules. Musculoskeletal: Faintly sclerotic manubrial lesion is significantly less apparent compared to 11/30/2017 CT and is not substantially changed since 01/10/2019 CT. No new focal osseous lesions in the chest.  Mild thoracic spondylosis. CT ABDOMEN PELVIS FINDINGS Hepatobiliary: Normal liver with no liver mass. Normal gallbladder with no radiopaque cholelithiasis. No biliary ductal dilatation. Pancreas: Normal, with no mass or duct dilation. Spleen: Normal size. No mass. Adrenals/Urinary Tract: Stable adrenals without discrete adrenal nodules. Subcentimeter hypodense renal cortical lesion in posterior interpolar right kidney is too small to characterize and unchanged, considered benign. Otherwise normal kidneys, with no hydronephrosis. Normal bladder. Stomach/Bowel: Normal non-distended stomach. Normal caliber small bowel with no small bowel wall thickening. Appendectomy. Oral contrast transits to the rectum. Normal large bowel with no diverticulosis, large bowel wall  thickening or pericolonic fat stranding. Vascular/Lymphatic: Atherosclerotic nonaneurysmal abdominal aorta. Patent portal, splenic, hepatic and renal veins. No pathologically enlarged lymph nodes in the abdomen or pelvis. Reproductive: Stable mildly enlarged myomatous uterus with coarsely calcified degenerated fibroids. No adnexal masses. Other: No pneumoperitoneum, ascites or focal fluid collection. Stable small fat containing periumbilical hernia. Musculoskeletal: No aggressive appearing focal osseous lesions. Marked lumbar spondylosis. A total hip arthroplasty. IMPRESSION: 1. No new or progressive metastatic disease in the chest, abdomen or pelvis. 2. Stable faintly sclerotic manubrial lesion. 3. Chronic findings include: Aortic Atherosclerosis (ICD10-I70.0). Enlarged myomatous uterus. Electronically Signed   By: Ilona Sorrel M.D.   On: 05/23/2019 14:10   Ct Abdomen Pelvis W Contrast  Result Date: 05/23/2019 CLINICAL DATA:  Recurrent metastatic left breast cancer on immunotherapy. No current symptoms reported. Restaging. EXAM: CT CHEST, ABDOMEN, AND PELVIS WITH CONTRAST TECHNIQUE: Multidetector CT imaging of the chest, abdomen and pelvis was performed  following the standard protocol during bolus administration of intravenous contrast. CONTRAST:  149m OMNIPAQUE IOHEXOL 300 MG/ML  SOLN COMPARISON:  01/10/2019 CT chest, abdomen and pelvis. FINDINGS: CT CHEST FINDINGS Cardiovascular: Normal heart size. No significant pericardial effusion/thickening. Atherosclerotic nonaneurysmal thoracic aorta. Normal caliber pulmonary arteries. No central pulmonary emboli. Right subclavian Port-A-Cath terminates at the cavoatrial junction. Mediastinum/Nodes: No discrete thyroid nodules. Subcentimeter hypodense bilateral anterior thyroid nodules are stable. No pathologically enlarged axillary, mediastinal or hilar lymph nodes. Lungs/Pleura: No pneumothorax. No pleural effusion. Stable small sharply marginated focus of consolidation with mild bronchiectasis, volume loss and distortion in the anterior apical left upper lobe (series 3/image 20), compatible with postradiation change. Left lower lobe 3 mm solid pulmonary nodule (series 3/image 69), stable since 11/30/2017 CT. No acute consolidative airspace disease, lung masses or new significant pulmonary nodules. Musculoskeletal: Faintly sclerotic manubrial lesion is significantly less apparent compared to 11/30/2017 CT and is not substantially changed since 01/10/2019 CT. No new focal osseous lesions in the chest. Mild thoracic spondylosis. CT ABDOMEN PELVIS FINDINGS Hepatobiliary: Normal liver with no liver mass. Normal gallbladder with no radiopaque cholelithiasis. No biliary ductal dilatation. Pancreas: Normal, with no mass or duct dilation. Spleen: Normal size. No mass. Adrenals/Urinary Tract: Stable adrenals without discrete adrenal nodules. Subcentimeter hypodense renal cortical lesion in posterior interpolar right kidney is too small to characterize and unchanged, considered benign. Otherwise normal kidneys, with no hydronephrosis. Normal bladder. Stomach/Bowel: Normal non-distended stomach. Normal caliber small bowel with no  small bowel wall thickening. Appendectomy. Oral contrast transits to the rectum. Normal large bowel with no diverticulosis, large bowel wall thickening or pericolonic fat stranding. Vascular/Lymphatic: Atherosclerotic nonaneurysmal abdominal aorta. Patent portal, splenic, hepatic and renal veins. No pathologically enlarged lymph nodes in the abdomen or pelvis. Reproductive: Stable mildly enlarged myomatous uterus with coarsely calcified degenerated fibroids. No adnexal masses. Other: No pneumoperitoneum, ascites or focal fluid collection. Stable small fat containing periumbilical hernia. Musculoskeletal: No aggressive appearing focal osseous lesions. Marked lumbar spondylosis. A total hip arthroplasty. IMPRESSION: 1. No new or progressive metastatic disease in the chest, abdomen or pelvis. 2. Stable faintly sclerotic manubrial lesion. 3. Chronic findings include: Aortic Atherosclerosis (ICD10-I70.0). Enlarged myomatous uterus. Electronically Signed   By: JIlona SorrelM.D.   On: 05/23/2019 14:10    ASSESSMENT: Carcinoma of overlapping sites of left breast in female, estrogen receptor negative  PLAN:    1. Carcinoma of overlapping sites of left breast in female, estrogen receptor negative: CT scan results from May 23, 2019 reviewed independently and reported as above with no new or progressive metastatic  disease noted.  Patient's most recent MUGA scan in July 2020 reported an EF of approximately 60%.  Repeat prior to next infusion.  Proceed with her next infusion of Herceptin and Perjeta today.  Return to clinic in 3 weeks for further evaluation and continuation of treatment. 2.  Back pain: Continue tramadol as needed. 3.  Dementia: Patient has previously been referred to neurology and awaiting recommendations.  Consider brain MRI in the future.  Patient expressed understanding and was in agreement with this plan. She also understands that She can call clinic at any time with any questions, concerns, or  complaints.     Lloyd Huger, MD   05/26/2019 6:31 AM

## 2019-05-23 ENCOUNTER — Ambulatory Visit
Admission: RE | Admit: 2019-05-23 | Discharge: 2019-05-23 | Disposition: A | Discharge status: Home / Self Care | Payer: Medicare Other | Source: Ambulatory Visit | Attending: Internal Medicine | Admitting: Internal Medicine

## 2019-05-23 ENCOUNTER — Other Ambulatory Visit: Payer: Self-pay

## 2019-05-23 DIAGNOSIS — C50812 Malignant neoplasm of overlapping sites of left female breast: Secondary | ICD-10-CM | POA: Diagnosis not present

## 2019-05-23 DIAGNOSIS — Z171 Estrogen receptor negative status [ER-]: Secondary | ICD-10-CM | POA: Insufficient documentation

## 2019-05-23 MED ORDER — IOHEXOL 300 MG/ML  SOLN
100.0000 mL | Freq: Once | INTRAMUSCULAR | Status: AC | PRN
  Administered 2019-05-23: 100 mL via INTRAVENOUS

## 2019-05-24 ENCOUNTER — Other Ambulatory Visit: Payer: Self-pay

## 2019-05-24 NOTE — Progress Notes (Signed)
Patient pre screened husband provided information.

## 2019-05-25 ENCOUNTER — Inpatient Hospital Stay (HOSPITAL_BASED_OUTPATIENT_CLINIC_OR_DEPARTMENT_OTHER): Payer: Medicare Other | Admitting: Oncology

## 2019-05-25 ENCOUNTER — Inpatient Hospital Stay: Payer: Medicare Other

## 2019-05-25 ENCOUNTER — Other Ambulatory Visit: Payer: Self-pay

## 2019-05-25 VITALS — BP 109/70 | HR 80 | Temp 96.9°F | Resp 20 | Ht 63.0 in | Wt 166.6 lb

## 2019-05-25 DIAGNOSIS — Z171 Estrogen receptor negative status [ER-]: Secondary | ICD-10-CM

## 2019-05-25 DIAGNOSIS — Z5112 Encounter for antineoplastic immunotherapy: Secondary | ICD-10-CM | POA: Diagnosis not present

## 2019-05-25 DIAGNOSIS — M549 Dorsalgia, unspecified: Secondary | ICD-10-CM | POA: Diagnosis not present

## 2019-05-25 DIAGNOSIS — C50812 Malignant neoplasm of overlapping sites of left female breast: Secondary | ICD-10-CM

## 2019-05-25 LAB — CBC WITH DIFFERENTIAL/PLATELET
Abs Immature Granulocytes: 0.01 10*3/uL (ref 0.00–0.07)
Basophils Absolute: 0 10*3/uL (ref 0.0–0.1)
Basophils Relative: 0 %
Eosinophils Absolute: 0.2 10*3/uL (ref 0.0–0.5)
Eosinophils Relative: 3 %
HCT: 42 % (ref 36.0–46.0)
Hemoglobin: 13.7 g/dL (ref 12.0–15.0)
Immature Granulocytes: 0 %
Lymphocytes Relative: 20 %
Lymphs Abs: 1.1 10*3/uL (ref 0.7–4.0)
MCH: 30.1 pg (ref 26.0–34.0)
MCHC: 32.6 g/dL (ref 30.0–36.0)
MCV: 92.3 fL (ref 80.0–100.0)
Monocytes Absolute: 0.3 10*3/uL (ref 0.1–1.0)
Monocytes Relative: 6 %
Neutro Abs: 3.8 10*3/uL (ref 1.7–7.7)
Neutrophils Relative %: 71 %
Platelets: 259 10*3/uL (ref 150–400)
RBC: 4.55 MIL/uL (ref 3.87–5.11)
RDW: 13.1 % (ref 11.5–15.5)
WBC: 5.4 10*3/uL (ref 4.0–10.5)
nRBC: 0 % (ref 0.0–0.2)

## 2019-05-25 LAB — URINALYSIS, COMPLETE (UACMP) WITH MICROSCOPIC
Bacteria, UA: NONE SEEN
Bilirubin Urine: NEGATIVE
Glucose, UA: NEGATIVE mg/dL
Hgb urine dipstick: NEGATIVE
Ketones, ur: NEGATIVE mg/dL
Leukocytes,Ua: NEGATIVE
Nitrite: NEGATIVE
Protein, ur: NEGATIVE mg/dL
Specific Gravity, Urine: 1.017 (ref 1.005–1.030)
pH: 6 (ref 5.0–8.0)

## 2019-05-25 LAB — COMPREHENSIVE METABOLIC PANEL
ALT: 21 U/L (ref 0–44)
AST: 23 U/L (ref 15–41)
Albumin: 4.1 g/dL (ref 3.5–5.0)
Alkaline Phosphatase: 62 U/L (ref 38–126)
Anion gap: 10 (ref 5–15)
BUN: 17 mg/dL (ref 8–23)
CO2: 26 mmol/L (ref 22–32)
Calcium: 8.6 mg/dL — ABNORMAL LOW (ref 8.9–10.3)
Chloride: 103 mmol/L (ref 98–111)
Creatinine, Ser: 0.76 mg/dL (ref 0.44–1.00)
GFR calc Af Amer: >60 mL/min (ref >60)
GFR calc non Af Amer: >60 mL/min (ref >60)
Glucose, Bld: 157 mg/dL — ABNORMAL HIGH (ref 70–99)
Potassium: 3.7 mmol/L (ref 3.5–5.1)
Sodium: 139 mmol/L (ref 135–145)
Total Bilirubin: 0.4 mg/dL (ref 0.3–1.2)
Total Protein: 6.6 g/dL (ref 6.5–8.1)

## 2019-05-25 MED ORDER — SODIUM CHLORIDE 0.9 % IV SOLN
Freq: Once | INTRAVENOUS | Status: AC
  Administered 2019-05-25: 10:00:00 via INTRAVENOUS
  Filled 2019-05-25: qty 250

## 2019-05-25 MED ORDER — HEPARIN SOD (PORK) LOCK FLUSH 100 UNIT/ML IV SOLN
500.0000 [IU] | Freq: Once | INTRAVENOUS | Status: AC | PRN
  Administered 2019-05-25: 500 [IU]

## 2019-05-25 MED ORDER — HEPARIN SOD (PORK) LOCK FLUSH 100 UNIT/ML IV SOLN
500.0000 [IU] | Freq: Once | INTRAVENOUS | Status: DC
  Filled 2019-05-25: qty 5

## 2019-05-25 MED ORDER — ACETAMINOPHEN 325 MG PO TABS
650.0000 mg | ORAL_TABLET | Freq: Once | ORAL | Status: AC
  Administered 2019-05-25: 650 mg via ORAL
  Filled 2019-05-25: qty 2

## 2019-05-25 MED ORDER — SODIUM CHLORIDE 0.9% FLUSH
10.0000 mL | INTRAVENOUS | Status: DC | PRN
  Administered 2019-05-25: 10 mL via INTRAVENOUS
  Filled 2019-05-25: qty 10

## 2019-05-25 MED ORDER — SODIUM CHLORIDE 0.9 % IV SOLN
420.0000 mg | Freq: Once | INTRAVENOUS | Status: AC
  Administered 2019-05-25: 12:00:00 420 mg via INTRAVENOUS
  Filled 2019-05-25: qty 14

## 2019-05-25 MED ORDER — TRASTUZUMAB-DKST CHEMO 150 MG IV SOLR
450.0000 mg | Freq: Once | INTRAVENOUS | Status: AC
  Administered 2019-05-25: 450 mg via INTRAVENOUS
  Filled 2019-05-25: qty 21.43

## 2019-05-25 MED ORDER — DIPHENHYDRAMINE HCL 50 MG/ML IJ SOLN
12.5000 mg | Freq: Once | INTRAMUSCULAR | Status: AC
  Administered 2019-05-25: 10:00:00 12.5 mg via INTRAVENOUS
  Filled 2019-05-25: qty 1

## 2019-05-26 LAB — URINE CULTURE

## 2019-05-27 ENCOUNTER — Telehealth: Payer: Self-pay | Admitting: *Deleted

## 2019-05-27 NOTE — Telephone Encounter (Signed)
Husband called asking for a return call to discuss CT results.  IMPRESSION: 1. No new or progressive metastatic disease in the chest, abdomen or pelvis. 2. Stable faintly sclerotic manubrial lesion. 3. Chronic findings include: Aortic Atherosclerosis (ICD10-I70.0). Enlarged myomatous uterus.   Electronically Signed   By: Ilona Sorrel M.D.   On: 05/23/2019 14:10

## 2019-05-27 NOTE — Telephone Encounter (Signed)
Called husband. Wife gave permission to speak to husband. I discussed results with him.

## 2019-06-08 ENCOUNTER — Other Ambulatory Visit: Payer: Self-pay

## 2019-06-08 ENCOUNTER — Ambulatory Visit
Admission: RE | Admit: 2019-06-08 | Discharge: 2019-06-08 | Disposition: A | Discharge status: Home / Self Care | Payer: Medicare Other | Source: Ambulatory Visit | Attending: Oncology | Admitting: Oncology

## 2019-06-08 DIAGNOSIS — Z5181 Encounter for therapeutic drug level monitoring: Secondary | ICD-10-CM | POA: Insufficient documentation

## 2019-06-08 DIAGNOSIS — C50812 Malignant neoplasm of overlapping sites of left female breast: Secondary | ICD-10-CM | POA: Insufficient documentation

## 2019-06-08 DIAGNOSIS — Z79899 Other long term (current) drug therapy: Secondary | ICD-10-CM | POA: Insufficient documentation

## 2019-06-08 DIAGNOSIS — Z171 Estrogen receptor negative status [ER-]: Secondary | ICD-10-CM | POA: Diagnosis present

## 2019-06-08 MED ORDER — TECHNETIUM TC 99M-LABELED RED BLOOD CELLS IV KIT
20.0000 | PACK | Freq: Once | INTRAVENOUS | Status: AC | PRN
  Administered 2019-06-08: 22.002 via INTRAVENOUS

## 2019-06-14 ENCOUNTER — Encounter: Payer: Self-pay | Admitting: Internal Medicine

## 2019-06-14 ENCOUNTER — Other Ambulatory Visit: Payer: Self-pay

## 2019-06-14 ENCOUNTER — Other Ambulatory Visit: Payer: Self-pay | Admitting: *Deleted

## 2019-06-14 DIAGNOSIS — C50812 Malignant neoplasm of overlapping sites of left female breast: Secondary | ICD-10-CM

## 2019-06-14 DIAGNOSIS — M549 Dorsalgia, unspecified: Secondary | ICD-10-CM

## 2019-06-14 NOTE — Progress Notes (Signed)
Pre-visit assessment notes:  1. Recent scans include MUGA and CT  2. Rash under left breast - dermatology appointment on 06/14/2019 for evaluation.  3. Occasional bouts of nausea and diarrhea   4. No new concerns  Information was provided by Tillman Sers (husband & POA)

## 2019-06-15 ENCOUNTER — Inpatient Hospital Stay: Payer: Medicare Other | Attending: Internal Medicine | Admitting: *Deleted

## 2019-06-15 ENCOUNTER — Inpatient Hospital Stay (HOSPITAL_BASED_OUTPATIENT_CLINIC_OR_DEPARTMENT_OTHER): Payer: Medicare Other | Admitting: Internal Medicine

## 2019-06-15 ENCOUNTER — Inpatient Hospital Stay: Payer: Medicare Other

## 2019-06-15 ENCOUNTER — Other Ambulatory Visit: Payer: Self-pay

## 2019-06-15 DIAGNOSIS — Z87891 Personal history of nicotine dependence: Secondary | ICD-10-CM | POA: Insufficient documentation

## 2019-06-15 DIAGNOSIS — Z803 Family history of malignant neoplasm of breast: Secondary | ICD-10-CM | POA: Insufficient documentation

## 2019-06-15 DIAGNOSIS — C50812 Malignant neoplasm of overlapping sites of left female breast: Secondary | ICD-10-CM | POA: Diagnosis not present

## 2019-06-15 DIAGNOSIS — Z791 Long term (current) use of non-steroidal anti-inflammatories (NSAID): Secondary | ICD-10-CM | POA: Insufficient documentation

## 2019-06-15 DIAGNOSIS — Z923 Personal history of irradiation: Secondary | ICD-10-CM | POA: Diagnosis not present

## 2019-06-15 DIAGNOSIS — Z79899 Other long term (current) drug therapy: Secondary | ICD-10-CM | POA: Insufficient documentation

## 2019-06-15 DIAGNOSIS — Z171 Estrogen receptor negative status [ER-]: Secondary | ICD-10-CM | POA: Diagnosis not present

## 2019-06-15 DIAGNOSIS — M25559 Pain in unspecified hip: Secondary | ICD-10-CM | POA: Diagnosis not present

## 2019-06-15 DIAGNOSIS — F0390 Unspecified dementia without behavioral disturbance: Secondary | ICD-10-CM | POA: Insufficient documentation

## 2019-06-15 DIAGNOSIS — Z9221 Personal history of antineoplastic chemotherapy: Secondary | ICD-10-CM | POA: Insufficient documentation

## 2019-06-15 DIAGNOSIS — G8929 Other chronic pain: Secondary | ICD-10-CM | POA: Insufficient documentation

## 2019-06-15 DIAGNOSIS — Z8249 Family history of ischemic heart disease and other diseases of the circulatory system: Secondary | ICD-10-CM | POA: Insufficient documentation

## 2019-06-15 DIAGNOSIS — E785 Hyperlipidemia, unspecified: Secondary | ICD-10-CM | POA: Diagnosis not present

## 2019-06-15 DIAGNOSIS — Z5112 Encounter for antineoplastic immunotherapy: Secondary | ICD-10-CM | POA: Diagnosis not present

## 2019-06-15 DIAGNOSIS — Z95828 Presence of other vascular implants and grafts: Secondary | ICD-10-CM

## 2019-06-15 LAB — COMPREHENSIVE METABOLIC PANEL
ALT: 22 U/L (ref 0–44)
AST: 25 U/L (ref 15–41)
Albumin: 4 g/dL (ref 3.5–5.0)
Alkaline Phosphatase: 64 U/L (ref 38–126)
Anion gap: 7 (ref 5–15)
BUN: 16 mg/dL (ref 8–23)
CO2: 26 mmol/L (ref 22–32)
Calcium: 8.4 mg/dL — ABNORMAL LOW (ref 8.9–10.3)
Chloride: 102 mmol/L (ref 98–111)
Creatinine, Ser: 0.74 mg/dL (ref 0.44–1.00)
GFR calc Af Amer: >60 mL/min (ref >60)
GFR calc non Af Amer: >60 mL/min (ref >60)
Glucose, Bld: 158 mg/dL — ABNORMAL HIGH (ref 70–99)
Potassium: 3.5 mmol/L (ref 3.5–5.1)
Sodium: 135 mmol/L (ref 135–145)
Total Bilirubin: 0.6 mg/dL (ref 0.3–1.2)
Total Protein: 6.7 g/dL (ref 6.5–8.1)

## 2019-06-15 LAB — CBC WITH DIFFERENTIAL/PLATELET
Abs Immature Granulocytes: 0.01 10*3/uL (ref 0.00–0.07)
Basophils Absolute: 0 10*3/uL (ref 0.0–0.1)
Basophils Relative: 0 %
Eosinophils Absolute: 0.2 10*3/uL (ref 0.0–0.5)
Eosinophils Relative: 3 %
HCT: 42.3 % (ref 36.0–46.0)
Hemoglobin: 13.6 g/dL (ref 12.0–15.0)
Immature Granulocytes: 0 %
Lymphocytes Relative: 17 %
Lymphs Abs: 1 10*3/uL (ref 0.7–4.0)
MCH: 29.7 pg (ref 26.0–34.0)
MCHC: 32.2 g/dL (ref 30.0–36.0)
MCV: 92.4 fL (ref 80.0–100.0)
Monocytes Absolute: 0.3 10*3/uL (ref 0.1–1.0)
Monocytes Relative: 6 %
Neutro Abs: 4.1 10*3/uL (ref 1.7–7.7)
Neutrophils Relative %: 74 %
Platelets: 260 10*3/uL (ref 150–400)
RBC: 4.58 MIL/uL (ref 3.87–5.11)
RDW: 13.1 % (ref 11.5–15.5)
WBC: 5.6 10*3/uL (ref 4.0–10.5)
nRBC: 0 % (ref 0.0–0.2)

## 2019-06-15 MED ORDER — SODIUM CHLORIDE 0.9% FLUSH
10.0000 mL | INTRAVENOUS | Status: DC | PRN
  Filled 2019-06-15: qty 10

## 2019-06-15 MED ORDER — SODIUM CHLORIDE 0.9% FLUSH
10.0000 mL | Freq: Once | INTRAVENOUS | Status: AC
  Administered 2019-06-15: 10 mL via INTRAVENOUS
  Filled 2019-06-15: qty 10

## 2019-06-15 MED ORDER — TRASTUZUMAB-DKST CHEMO 150 MG IV SOLR
450.0000 mg | Freq: Once | INTRAVENOUS | Status: AC
  Administered 2019-06-15: 450 mg via INTRAVENOUS
  Filled 2019-06-15: qty 21.43

## 2019-06-15 MED ORDER — SODIUM CHLORIDE 0.9 % IV SOLN
Freq: Once | INTRAVENOUS | Status: AC
  Administered 2019-06-15: 10:00:00 via INTRAVENOUS
  Filled 2019-06-15: qty 250

## 2019-06-15 MED ORDER — HEPARIN SOD (PORK) LOCK FLUSH 100 UNIT/ML IV SOLN
500.0000 [IU] | Freq: Once | INTRAVENOUS | Status: AC | PRN
  Administered 2019-06-15: 500 [IU]
  Filled 2019-06-15: qty 5

## 2019-06-15 MED ORDER — ACETAMINOPHEN 325 MG PO TABS
650.0000 mg | ORAL_TABLET | Freq: Once | ORAL | Status: AC
  Administered 2019-06-15: 650 mg via ORAL
  Filled 2019-06-15: qty 2

## 2019-06-15 MED ORDER — DIPHENHYDRAMINE HCL 50 MG/ML IJ SOLN
12.5000 mg | Freq: Once | INTRAMUSCULAR | Status: AC
  Administered 2019-06-15: 10:00:00 12.5 mg via INTRAVENOUS
  Filled 2019-06-15: qty 1

## 2019-06-15 MED ORDER — SODIUM CHLORIDE 0.9 % IV SOLN
420.0000 mg | Freq: Once | INTRAVENOUS | Status: AC
  Administered 2019-06-15: 420 mg via INTRAVENOUS
  Filled 2019-06-15: qty 14

## 2019-06-15 NOTE — Progress Notes (Signed)
[name]  Patient Care Team: [name], [name], MD (General Surgery) Requested, [name]   Oncology History Overview Note  # June 2016- LEFT BREAST CA;  [name] ; [name] ; [name]; Her 2 Neu POS; [place] from July OF 2016; [name]; adjuvant Herceptin [ Finished July 2017]; AUG 2017- Neratinib x5 days; DISCON sec to diarrhea  # MID OCT 2018- Right breast mass-Bx- ER/PR-NEG; Her 2 NEU POSITIVE 1~2.5cm;  [?NEW primary]  # MID-OCT 2018-METASTATIC RECURRENT-oh sternal mass; Left Ax LN [Bx]/periportal LN  # OCT 25th 2018- TAXOL-HERCEPTIN-PERJETA; Jan 2019- CT PR; continue HP only; on HOLD since NOV 2019--sec to drop in EF/Sieziues  # July 15th 2020- RE-STARTED HP   -------------------------------------------------------------------  # MUGA scan- July 28th 2017- 67%.  December 2019-EF 53%; January 2020 EF-52%  # chronic gait/balance issues  # Seizures/petit-mal/ Dr.Shah-Keppra [name] [id number]]  # June 2017- left breast Bx- fat necrosis [[name]  # july 2017-  BRCA 1& 2- NEG.   MOLECULAR TESTING- F ONE- TPS- 0%;  ERB2 amplification; PI3K/RET amplification Others**  --------------------------------------------------    DIAGNOSIS: [ OCT 2018]- REC/MET- BREAST CA ER/PR-NEG; her 2 POS  STAGE: 4  ;GOALS: Palliative  CURRENT/MOST RECENT THERAPY- Herceptin-Perjeta    Carcinoma of overlapping sites of left breast in female, estrogen receptor negative ([place])      INTERVAL HISTORY: Patient a poor historian given dementia.  I spoke to patient's husband over the phone.   [name] 78 y.o.  female pleasant patient above history of metastatic breast cancer on Herceptin plus Perjeta-is here for follow-up.  She continues to have chronic back pain/hip pain.  No new  lumps or bumps.  Chronic pain in the left axilla.  As per family patient is fatigued/but no shortness of breath no falls.  No headaches.    Review of Systems  Unable to perform ROS: Dementia     PAST MEDICAL HISTORY :  Past Medical History:  Diagnosis Date  . Arthritis   . Brain tumor ([place]) 1995   meningeoma  . Breast cancer ([place]) 2018   left breast; surgery 01/11/15 with Dr. Bary Castilla; path with invasive mammary and DCIS  . Breast cancer of upper-inner quadrant of left female breast ([place]) 12/15/14   Completed radiation end of December and finished chemotherapy 2 weeks ago, Left breast invasive mammary carcinoma, T1cN9mc (1.5 cm); Grade 3, [name], [name], [name], .  .Marland KitchenCataract    bilat   . DDD (degenerative disc disease), lumbar    Lumbar, previously evaluated by Dr. [place]arnestine Leys . [place]   . Fibrocystic breast disease    prior biopsy  . Glaucoma   . 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 Hearing loss   . History of cancer chemotherapy   . History of radiation therapy   . Hyperlipidemia, unspecified   . Imbalance   . Memory impairment    seen by Dr SManuella Ghazi possible post crainiotomy from radiation  . Meningioma ([place]    Left cavernous sinus meningioma, [facility]  . Numbness and tingling    right hand   . Osteoarthritis   . Osteoporosis, post-menopausal   . Personal history of  chemotherapy   . Personal history of radiation therapy   . Shoulder pain, right   . Wears glasses     PAST SURGICAL HISTORY :   Past Surgical History:  Procedure Laterality Date  . APPENDECTOMY  1950  . BRAIN SURGERY  1995   left frontal/temporal  . BREAST BIOPSY Left 12/15/14   confirmed DCIS  . BREAST BIOPSY Left 01/08/2015   Procedure: BREAST BIOPSY WITH NEEDLE LOCALIZATION;  Surgeon: [name], MD;  Location: [facility] ORS;  Service: General;  Laterality: Left;  . BREAST  EXCISIONAL BIOPSY Left 1997  . BREAST LUMPECTOMY Left 01/08/2015   Procedure: LUMPECTOMY;  Surgeon: [name], MD;  Location: [facility] ORS;  Service: General;  Laterality: Left;  . COLONOSCOPY  2010   Dr. [name]  . ORIF ANKLE FRACTURE Right 08/05/2017   Procedure: OPEN REDUCTION INTERNAL FIXATION (ORIF) ANKLE FRACTURE;  Surgeon: Earnestine Leys, MD;  Location: [facility] ORS;  Service: Orthopedics;  Laterality: Right;  . PORTACATH PLACEMENT Right 01/16/2015   Procedure: INSERTION PORT-A-CATH;  Surgeon: [name], MD;  Location: [facility] ORS;  Service: General;  Laterality: Right;  . SENTINEL NODE BIOPSY Left 01/16/2015   Procedure: SENTINEL NODE BIOPSY;  Surgeon: [name], MD;  Location: [facility] ORS;  Service: General;  Laterality: Left;  . TOTAL HIP ARTHROPLASTY Right 09/04/2015   Procedure: RIGHT TOTAL HIP ARTHROPLASTY ANTERIOR APPROACH;  Surgeon: [name], MD;  Location: [facility];  Service: Orthopedics;  Laterality: Right;    FAMILY HISTORY :   Family History  Problem Relation Age of Onset  . Breast cancer Sister 63  . Addison's disease Mother   . Hyperthyroidism Mother   . Osteoarthritis Mother   . Stroke Father   . Heart disease Father   . High blood pressure Father     SOCIAL HISTORY:   Social History   Tobacco Use  . Smoking status: Former Smoker    Packs/day: 0.25    Years: 5.00    Pack years: 1.25    Types: Cigarettes    Quit date: 07/28/1972    Years since quitting: 46.9  . Smokeless tobacco: Never Used  Substance Use Topics  . Alcohol use: Yes    Alcohol/week: 1.0 - 2.0 standard drinks    Types: 1 - 2 Glasses of wine per week    Comment: 1 Glass Wine / Night  . Drug use: No    ALLERGIES:  is allergic to donepezil.  MEDICATIONS:  Current Outpatient Medications  Medication Sig Dispense Refill  . acetaminophen (TYLENOL) 325 MG tablet Take 650 mg by mouth every 4 (four) hours as needed. Pain / increased temp.    . bimatoprost (LUMIGAN) 0.01 % SOLN Lumigan  0.01 % eye drops  INT 1 GTT IN OS HS    . Calcium Carbonate-Vitamin D (CALTRATE 600+D PO) Take 1 tablet by mouth daily.    . celecoxib (CELEBREX) 200 MG capsule Take 200 mg by mouth daily.     . Cetirizine HCl (ZYRTEC ALLERGY PO) Take 1 tablet by mouth as needed (allergies).    . Cholecalciferol (VITAMIN D-3) 5000 units TABS Take 1 tablet by mouth daily.    Marland Kitchen levETIRAcetam (KEPPRA) 250 MG tablet 1 tablet daily at bedtime  5  . naproxen sodium (ALEVE) 220 MG tablet Take 220 mg by mouth daily as needed (pain).    . prochlorperazine (COMPAZINE) 10 MG tablet Take 1 tablet (10 mg total) every 6 (six) hours as needed by mouth for nausea  or vomiting. 40 tablet 1  . traMADol (ULTRAM) 50 MG tablet Take 1 tablet (50 mg total) by mouth 2 (two) times daily as needed for moderate pain. 60 tablet 0  . vitamin E 400 UNIT capsule Take 400 Units by mouth daily.      No current facility-administered medications for this visit.    Facility-Administered Medications Ordered in Other Visits  Medication Dose Route Frequency Provider Last Rate Last Dose  . heparin lock flush 100 unit/mL  500 Units Intracatheter Once PRN Cammie Sickle, MD      . pertuzumab (PERJETA) 420 mg in sodium chloride 0.9 % 250 mL chemo infusion  420 mg Intravenous Once Charlaine Dalton R, MD      . sodium chloride flush (NS) 0.9 % injection 10 mL  10 mL Intravenous PRN Cammie Sickle, MD   10 mL at 07/07/18 0830  . sodium chloride flush (NS) 0.9 % injection 10 mL  10 mL Intracatheter PRN Cammie Sickle, MD      . trastuzumab-dkst (OGIVRI) 450 mg in sodium chloride 0.9 % 250 mL chemo infusion  450 mg Intravenous Once Cammie Sickle, MD        PHYSICAL EXAMINATION: ECOG PERFORMANCE STATUS: 1 - Symptomatic but completely ambulatory  BP 117/73 (BP Location: Right Arm, Patient Position: Sitting, Cuff Size: Normal)   Pulse 77   Temp 97.7 F (36.5 C) (Tympanic)   Resp 20   Ht 5' 3"  (1.6 m)   Wt 166 lb (75.3  kg)   BMI 29.41 kg/m   Filed Weights   06/15/19 0849  Weight: 166 lb (75.3 kg)   Lifestyle  Physical activity  . Days per week: Not on file  . Minutes per session: Not on file     LABORATORY DATA:  I have reviewed the data as listed    Component Value Date/Time   NA 135 06/15/2019 0830   K 3.5 06/15/2019 0830   CL 102 06/15/2019 0830   CO2 26 06/15/2019 0830   GLUCOSE 158 (H) 06/15/2019 0830   BUN 16 06/15/2019 0830   CREATININE 0.74 06/15/2019 0830   CALCIUM 8.4 (L) 06/15/2019 0830   PROT 6.7 06/15/2019 0830   ALBUMIN 4.0 06/15/2019 0830   AST 25 06/15/2019 0830   ALT 22 06/15/2019 0830   ALKPHOS 64 06/15/2019 0830   BILITOT 0.6 06/15/2019 0830   GFRNONAA >60 06/15/2019 0830   GFRAA >60 06/15/2019 0830    No results found for: SPEP, UPEP  Lab Results  Component Value Date   WBC 5.6 06/15/2019   NEUTROABS 4.1 06/15/2019   HGB 13.6 06/15/2019   HCT 42.3 06/15/2019   MCV 92.4 06/15/2019   PLT 260 06/15/2019      Chemistry      Component Value Date/Time   NA 135 06/15/2019 0830   K 3.5 06/15/2019 0830   CL 102 06/15/2019 0830   CO2 26 06/15/2019 0830   BUN 16 06/15/2019 0830   CREATININE 0.74 06/15/2019 0830      Component Value Date/Time   CALCIUM 8.4 (L) 06/15/2019 0830   ALKPHOS 64 06/15/2019 0830   AST 25 06/15/2019 0830   ALT 22 06/15/2019 0830   BILITOT 0.6 06/15/2019 0830       Physical Exam  Constitutional: She is oriented to person, place, and time and well-developed, well-nourished, and in no distress.  Patient is alone.  In a wheelchair.  HENT:  Head: Normocephalic and atraumatic.  Mouth/Throat: Oropharynx is  clear and moist. No oropharyngeal exudate.  Eyes: Pupils are equal, round, and reactive to light.  Neck: Normal range of motion. Neck supple.  Cardiovascular: Normal rate and regular rhythm.  Pulmonary/Chest: No respiratory distress. She has no wheezes.  Abdominal: Soft. Bowel sounds are normal. She exhibits no distension and  no mass. There is no abdominal tenderness. There is no rebound and no guarding.  Musculoskeletal: Normal range of motion.        General: No tenderness or edema.  Neurological: She is alert and oriented to person, place, and time.  Skin: Skin is warm.  Psychiatric: Affect normal.    LABORATORY DATA:  I have reviewed the data as listed    Component Value Date/Time   NA 135 06/15/2019 0830   K 3.5 06/15/2019 0830   CL 102 06/15/2019 0830   CO2 26 06/15/2019 0830   GLUCOSE 158 (H) 06/15/2019 0830   BUN 16 06/15/2019 0830   CREATININE 0.74 06/15/2019 0830   CALCIUM 8.4 (L) 06/15/2019 0830   PROT 6.7 06/15/2019 0830   ALBUMIN 4.0 06/15/2019 0830   AST 25 06/15/2019 0830   ALT 22 06/15/2019 0830   ALKPHOS 64 06/15/2019 0830   BILITOT 0.6 06/15/2019 0830   GFRNONAA >60 06/15/2019 0830   GFRAA >60 06/15/2019 0830    No results found for: SPEP, UPEP  Lab Results  Component Value Date   WBC 5.6 06/15/2019   NEUTROABS 4.1 06/15/2019   HGB 13.6 06/15/2019   HCT 42.3 06/15/2019   MCV 92.4 06/15/2019   PLT 260 06/15/2019      Chemistry      Component Value Date/Time   NA 135 06/15/2019 0830   K 3.5 06/15/2019 0830   CL 102 06/15/2019 0830   CO2 26 06/15/2019 0830   BUN 16 06/15/2019 0830   CREATININE 0.74 06/15/2019 0830      Component Value Date/Time   CALCIUM 8.4 (L) 06/15/2019 0830   ALKPHOS 64 06/15/2019 0830   AST 25 06/15/2019 0830   ALT 22 06/15/2019 0830   BILITOT 0.6 06/15/2019 0830       RADIOGRAPHIC STUDIES: I have personally reviewed the radiological images as listed and agreed with the findings in the report. No results found.   ASSESSMENT & PLAN:  Carcinoma of overlapping sites of left breast in female, estrogen receptor negative ([place]) #Metastatic breast cancer ER [name] HER-2/neu positive.  OCT 2020- CT scan chest and pelvis NED/stable sclerosis of the left manubrium.  STABLE.   # Proceed with Herceptin Perjeta.  Labs today reviewed;  acceptable for treatment today.    # Drop in ejection fraction-July 2020- 58; Oct 2020-slightly lower- 52%; asymptomatic monitor for now.  Discussed with the husband that if continues to drop would recommend stopping current therapy.  Other options include TDM 1.  #Chronic back and hip pain--STABLE.   On asper patch/ tramadol prn.  #Left breast/incisional pain-stable  # Dementia/ chronic debility/fatigue-do not suspect any metastatic disease to the brain.  Monitor closely.  I spoke at length with the patient's family/husband,Dr.Treat- regarding the patient's clinical status/plan of care.  Family agreement.   DISPOSITION:  #Herceptin-Perjeta today # Follow up in 3 weeks-MD;labs-cbc/cmp; Herceptin-Perjeta Dr.B    No orders of the defined types were placed in this encounter.  All questions were answered. The patient knows to call the clinic with any problems, questions or concerns.      Cammie Sickle, MD 06/15/2019 10:00 AM

## 2019-06-15 NOTE — Assessment & Plan Note (Addendum)
#  Metastatic breast cancer ER [name] HER-2/neu positive.  OCT 2020- CT scan chest and pelvis NED/stable sclerosis of the left manubrium.  STABLE.   # Proceed with Herceptin Perjeta.  Labs today reviewed; acceptable for treatment today.    # Drop in ejection fraction-July 2020- 58; Oct 2020-slightly lower- 52%; asymptomatic monitor for now.  Discussed with the husband that if continues to drop would recommend stopping current therapy.  Other options include TDM 1.  #Chronic back and hip pain--STABLE.   On asper patch/ tramadol prn.  #Left breast/incisional pain-stable  # Dementia/ chronic debility/fatigue-do not suspect any metastatic disease to the brain.  Monitor closely.  I spoke at length with the patient's family/husband,Dr.Steinhoff- regarding the patient's clinical status/plan of care.  Family agreement.   DISPOSITION:  #Herceptin-Perjeta today # Follow up in 3 weeks-MD;labs-cbc/cmp; Herceptin-Perjeta Dr.B

## 2019-07-01 ENCOUNTER — Telehealth: Payer: Self-pay | Admitting: Internal Medicine

## 2019-07-01 NOTE — Telephone Encounter (Signed)
Late entry -spoke to patient's husband regarding his concerns stool incontinence [once a week episode ]/patient's reluctance to wear depends.  He is awaiting to have [facility] breast navigator speak with the patient at the next visit.

## 2019-07-06 ENCOUNTER — Inpatient Hospital Stay: Payer: Medicare Other

## 2019-07-06 ENCOUNTER — Encounter: Payer: Self-pay | Admitting: *Deleted

## 2019-07-06 ENCOUNTER — Encounter: Payer: Self-pay | Admitting: Internal Medicine

## 2019-07-06 ENCOUNTER — Inpatient Hospital Stay (HOSPITAL_BASED_OUTPATIENT_CLINIC_OR_DEPARTMENT_OTHER): Payer: Medicare Other | Admitting: Internal Medicine

## 2019-07-06 ENCOUNTER — Other Ambulatory Visit: Payer: Self-pay

## 2019-07-06 ENCOUNTER — Inpatient Hospital Stay: Payer: Medicare Other | Attending: Internal Medicine

## 2019-07-06 VITALS — BP 119/55 | HR 65 | Resp 18

## 2019-07-06 DIAGNOSIS — R159 Full incontinence of feces: Secondary | ICD-10-CM | POA: Diagnosis not present

## 2019-07-06 DIAGNOSIS — C50812 Malignant neoplasm of overlapping sites of left female breast: Secondary | ICD-10-CM | POA: Insufficient documentation

## 2019-07-06 DIAGNOSIS — G8929 Other chronic pain: Secondary | ICD-10-CM | POA: Diagnosis not present

## 2019-07-06 DIAGNOSIS — Z5112 Encounter for antineoplastic immunotherapy: Secondary | ICD-10-CM | POA: Diagnosis not present

## 2019-07-06 DIAGNOSIS — Z171 Estrogen receptor negative status [ER-]: Secondary | ICD-10-CM

## 2019-07-06 DIAGNOSIS — F0390 Unspecified dementia without behavioral disturbance: Secondary | ICD-10-CM | POA: Diagnosis not present

## 2019-07-06 DIAGNOSIS — M25559 Pain in unspecified hip: Secondary | ICD-10-CM | POA: Diagnosis not present

## 2019-07-06 DIAGNOSIS — R5383 Other fatigue: Secondary | ICD-10-CM | POA: Insufficient documentation

## 2019-07-06 LAB — CBC WITH DIFFERENTIAL/PLATELET
Abs Immature Granulocytes: 0.01 10*3/uL (ref 0.00–0.07)
Basophils Absolute: 0 10*3/uL (ref 0.0–0.1)
Basophils Relative: 0 %
Eosinophils Absolute: 0.2 10*3/uL (ref 0.0–0.5)
Eosinophils Relative: 3 %
HCT: 44.5 % (ref 36.0–46.0)
Hemoglobin: 13.9 g/dL (ref 12.0–15.0)
Immature Granulocytes: 0 %
Lymphocytes Relative: 19 %
Lymphs Abs: 1 10*3/uL (ref 0.7–4.0)
MCH: 29.1 pg (ref 26.0–34.0)
MCHC: 31.2 g/dL (ref 30.0–36.0)
MCV: 93.3 fL (ref 80.0–100.0)
Monocytes Absolute: 0.3 10*3/uL (ref 0.1–1.0)
Monocytes Relative: 6 %
Neutro Abs: 3.8 10*3/uL (ref 1.7–7.7)
Neutrophils Relative %: 72 %
Platelets: 278 10*3/uL (ref 150–400)
RBC: 4.77 MIL/uL (ref 3.87–5.11)
RDW: 13.2 % (ref 11.5–15.5)
WBC: 5.3 10*3/uL (ref 4.0–10.5)
nRBC: 0 % (ref 0.0–0.2)

## 2019-07-06 LAB — COMPREHENSIVE METABOLIC PANEL
ALT: 19 U/L (ref 0–44)
AST: 22 U/L (ref 15–41)
Albumin: 3.9 g/dL (ref 3.5–5.0)
Alkaline Phosphatase: 63 U/L (ref 38–126)
Anion gap: 8 (ref 5–15)
BUN: 16 mg/dL (ref 8–23)
CO2: 26 mmol/L (ref 22–32)
Calcium: 8.5 mg/dL — ABNORMAL LOW (ref 8.9–10.3)
Chloride: 103 mmol/L (ref 98–111)
Creatinine, Ser: 0.82 mg/dL (ref 0.44–1.00)
GFR calc Af Amer: >60 mL/min (ref >60)
GFR calc non Af Amer: >60 mL/min (ref >60)
Glucose, Bld: 171 mg/dL — ABNORMAL HIGH (ref 70–99)
Potassium: 3.6 mmol/L (ref 3.5–5.1)
Sodium: 137 mmol/L (ref 135–145)
Total Bilirubin: 0.6 mg/dL (ref 0.3–1.2)
Total Protein: 6.9 g/dL (ref 6.5–8.1)

## 2019-07-06 MED ORDER — TRASTUZUMAB-DKST CHEMO 150 MG IV SOLR
450.0000 mg | Freq: Once | INTRAVENOUS | Status: AC
  Administered 2019-07-06: 450 mg via INTRAVENOUS
  Filled 2019-07-06: qty 21.43

## 2019-07-06 MED ORDER — DIPHENHYDRAMINE HCL 50 MG/ML IJ SOLN
12.5000 mg | Freq: Once | INTRAMUSCULAR | Status: AC
  Administered 2019-07-06: 12.5 mg via INTRAVENOUS
  Filled 2019-07-06: qty 1

## 2019-07-06 MED ORDER — SODIUM CHLORIDE 0.9 % IV SOLN
Freq: Once | INTRAVENOUS | Status: AC
  Administered 2019-07-06: 10:00:00 via INTRAVENOUS
  Filled 2019-07-06: qty 250

## 2019-07-06 MED ORDER — ACETAMINOPHEN 325 MG PO TABS
650.0000 mg | ORAL_TABLET | Freq: Once | ORAL | Status: AC
  Administered 2019-07-06: 10:00:00 650 mg via ORAL
  Filled 2019-07-06: qty 2

## 2019-07-06 MED ORDER — SODIUM CHLORIDE 0.9% FLUSH
10.0000 mL | INTRAVENOUS | Status: DC | PRN
  Administered 2019-07-06: 10 mL via INTRAVENOUS
  Filled 2019-07-06: qty 10

## 2019-07-06 MED ORDER — HEPARIN SOD (PORK) LOCK FLUSH 100 UNIT/ML IV SOLN
500.0000 [IU] | Freq: Once | INTRAVENOUS | Status: AC
  Administered 2019-07-06: 500 [IU] via INTRAVENOUS
  Filled 2019-07-06: qty 5

## 2019-07-06 MED ORDER — HEPARIN SOD (PORK) LOCK FLUSH 100 UNIT/ML IV SOLN: 500.0000 [IU] | Freq: Once | INTRAVENOUS | Status: DC | PRN

## 2019-07-06 MED ORDER — SODIUM CHLORIDE 0.9 % IV SOLN
420.0000 mg | Freq: Once | INTRAVENOUS | Status: AC
  Administered 2019-07-06: 420 mg via INTRAVENOUS
  Filled 2019-07-06: qty 14

## 2019-07-06 NOTE — Progress Notes (Signed)
Clarified lab orders with Dr. Rogue Bussing. Per Dr. Rogue Bussing pt to have CBC and CMP collected.

## 2019-07-06 NOTE — Progress Notes (Signed)
I was unable to obtain patients blood pressure because she said that the cuff hurt too bad.   Patient reports having recent issues with incontinence.

## 2019-07-06 NOTE — Progress Notes (Signed)
[place] OFFICE PROGRESS NOTE  Patient Care Team: [name], [name], MD (General Surgery) Requested, [name]   Oncology History Overview Note  # June 2016- LEFT BREAST CA;  [name] ; [name] ; [name]; Her 2 Neu POS; [place] from July OF 2016; [name]; adjuvant Herceptin [ Finished July 2017]; AUG 2017- Neratinib x5 days; DISCON sec to diarrhea  # MID OCT 2018- Right breast mass-Bx- ER/PR-NEG; Her 2 NEU POSITIVE 1~2.5cm;  [?NEW primary]  # MID-OCT 2018-METASTATIC RECURRENT-oh sternal mass; Left Ax LN [Bx]/periportal LN  # OCT 25th 2018- TAXOL-HERCEPTIN-PERJETA; Jan 2019- CT PR; continue HP only; on HOLD since NOV 2019--sec to drop in EF/Sieziues  # July 15th 2020- RE-STARTED HP   -------------------------------------------------------------------  # MUGA scan- July 28th 2017- 67%.  December 2019-EF 53%; January 2020 EF-52%  # chronic gait/balance issues  # Seizures/petit-mal/ Dr.Shah-Keppra [name] [id number]]  # June 2017- left breast Bx- fat necrosis [[name]  # july 2017-  BRCA 1& 2- NEG.   MOLECULAR TESTING- F ONE- TPS- 0%;  ERB2 amplification; PI3K/RET amplification Others**  --------------------------------------------------    DIAGNOSIS: [ OCT 2018]- REC/MET- BREAST CA ER/PR-NEG; her 2 POS  STAGE: 4  ;GOALS: Palliative  CURRENT/MOST RECENT THERAPY- Herceptin-Perjeta    Carcinoma of overlapping sites of left breast in female, estrogen receptor negative ([place])     INTERVAL HISTORY: Patient a poor historian given dementia.  I spoke to patient's husband.  [name] 78 y.o.  female pleasant patient above history of metastatic breast cancer on Herceptin plus Perjeta-is here for follow-up.  Patient continues to have chronic left axilla pain.  Chronic back pain hip  pain.  No new lumps or bumps.  Patient continues to be fatigued/drowsy-question from [place].  Otherwise no shortness of breath no unusual cough.  No swelling in the legs.  Patient has been noted to have "stool incontinence" intermittently 1-2 episodes a week.   Review of Systems  Constitutional: Positive for malaise/fatigue. Negative for chills, diaphoresis, fever and weight loss.  HENT: Negative for nosebleeds and sore throat.   Eyes: Negative for double vision.  Respiratory: Negative for cough, hemoptysis, sputum production, shortness of breath and wheezing.   Cardiovascular: Negative for chest pain, palpitations, orthopnea and leg swelling.  Gastrointestinal: Negative for abdominal pain, blood in stool, constipation, diarrhea, heartburn, melena, nausea and vomiting.  Genitourinary: Negative for dysuria, frequency and urgency.  Musculoskeletal: Positive for back pain. Negative for joint pain.  Skin: Negative.  Negative for itching and rash.  Neurological: Negative for dizziness, tingling, focal weakness, weakness and headaches.  Endo/Heme/Allergies: Does not bruise/bleed easily.  Psychiatric/Behavioral: Negative for depression. The patient is nervous/anxious. The patient does not have insomnia.         PAST MEDICAL HISTORY :  Past Medical History:  Diagnosis Date  . Arthritis   . Brain tumor ([place]) 1995   meningeoma  . Breast cancer ([place]) 2018   left breast; surgery 01/11/15 with Dr. Bary Castilla; path with invasive mammary and DCIS  . Breast cancer of upper-inner quadrant of left female breast ([place]) 12/15/14   Completed radiation end of December and finished chemotherapy 2 weeks ago, Left breast invasive mammary carcinoma, T1cN72mc (1.5 cm); Grade 3, [name], [name], [name], .  .Marland KitchenCataract    bilat   .  DDD (degenerative disc disease), lumbar    Lumbar, previously evaluated by Dr. Earnestine Leys  . Dementia ([place])   . Fibrocystic breast disease     prior biopsy  . Glaucoma   . 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 Hearing loss   . History of cancer chemotherapy   . History of radiation therapy   . Hyperlipidemia, unspecified   . Imbalance   . Memory impairment    seen by Dr Manuella Ghazi  possible post crainiotomy from radiation  . 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, 1995.  . Numbness and tingling    right hand   . Osteoarthritis   . Osteoporosis, post-menopausal   . Personal history of chemotherapy   . Personal history of radiation therapy   . Shoulder pain, right   . Wears glasses     PAST SURGICAL HISTORY :   Past Surgical History:  Procedure Laterality Date  . APPENDECTOMY  1950  . BRAIN SURGERY  1995   left frontal/temporal  . BREAST BIOPSY Left 12/15/14   confirmed DCIS  . BREAST BIOPSY Left 01/08/2015   Procedure: BREAST BIOPSY WITH NEEDLE LOCALIZATION;  Surgeon: [name], MD;  Location: [facility] ORS;  Service: General;  Laterality: Left;  . BREAST EXCISIONAL BIOPSY Left 1997  . BREAST LUMPECTOMY Left 01/08/2015   Procedure: LUMPECTOMY;  Surgeon: [name], MD;  Location: [facility] ORS;  Service: General;  Laterality: Left;  . COLONOSCOPY  2010   Dr. [name]  . ORIF ANKLE FRACTURE Right 08/05/2017   Procedure: OPEN REDUCTION INTERNAL FIXATION (ORIF) ANKLE FRACTURE;  Surgeon: Earnestine Leys, MD;  Location: [facility] ORS;  Service: Orthopedics;  Laterality: Right;  . PORTACATH PLACEMENT Right 01/16/2015   Procedure: INSERTION PORT-A-CATH;  Surgeon: [name], MD;  Location: [facility] ORS;  Service: General;  Laterality: Right;  . SENTINEL NODE BIOPSY Left 01/16/2015   Procedure: SENTINEL NODE BIOPSY;  Surgeon: [name], MD;  Location: [facility] ORS;  Service: General;  Laterality: Left;  . TOTAL HIP ARTHROPLASTY Right 09/04/2015   Procedure: RIGHT TOTAL HIP ARTHROPLASTY ANTERIOR APPROACH;  Surgeon: [name], MD;  Location: [facility];   Service: Orthopedics;  Laterality: Right;    FAMILY HISTORY :   Family History  Problem Relation Age of Onset  . Breast cancer Sister 50  . Addison's disease Mother   . Hyperthyroidism Mother   . Osteoarthritis Mother   . Stroke Father   . Heart disease Father   . High blood pressure Father     SOCIAL HISTORY:   Social History   Tobacco Use  . Smoking status: Former Smoker    Packs/day: 0.25    Years: 5.00    Pack years: 1.25    Types: Cigarettes    Quit date: 07/28/1972    Years since quitting: 46.9  . Smokeless tobacco: Never Used  Substance Use Topics  . Alcohol use: Yes    Alcohol/week: 1.0 - 2.0 standard drinks    Types: 1 - 2 Glasses of wine per week    Comment: 1 Glass Wine / Night  . Drug use: No    ALLERGIES:  is allergic to donepezil.  MEDICATIONS:  Current Outpatient Medications  Medication Sig Dispense Refill  . acetaminophen (TYLENOL) 325 MG tablet Take 650 mg by mouth every 4 (four) hours as needed. Pain / increased temp.    . bimatoprost (LUMIGAN) 0.01 %  SOLN Lumigan 0.01 % eye drops  INT 1 GTT IN OS HS    . Calcium Carbonate-Vitamin D (CALTRATE 600+D PO) Take 1 tablet by mouth daily.    . celecoxib (CELEBREX) 200 MG capsule Take 200 mg by mouth daily.     . Cetirizine HCl (ZYRTEC ALLERGY PO) Take 1 tablet by mouth as needed (allergies).    . Cholecalciferol (VITAMIN D-3) 5000 units TABS Take 1 tablet by mouth daily.    . hydrocortisone 2.5 % cream APPLY  AA BID FOR UP TO 2 WEEKS EACH MONTH PRN    . levETIRAcetam (KEPPRA) 250 MG tablet 1 tablet daily at bedtime  5  . naproxen sodium (ALEVE) 220 MG tablet Take 220 mg by mouth daily as needed (pain).    . prochlorperazine (COMPAZINE) 10 MG tablet Take 1 tablet (10 mg total) every 6 (six) hours as needed by mouth for nausea or vomiting. 40 tablet 1  . traMADol (ULTRAM) 50 MG tablet Take 1 tablet (50 mg total) by mouth 2 (two) times daily as needed for moderate pain. 60 tablet 0  . VITAMIN D PO Take by  mouth. 1000 units    . vitamin E 400 UNIT capsule Take 400 Units by mouth daily.      No current facility-administered medications for this visit.    Facility-Administered Medications Ordered in Other Visits  Medication Dose Route Frequency Provider Last Rate Last Dose  . heparin lock flush 100 unit/mL  500 Units Intravenous Once Charlaine Dalton R, MD      . sodium chloride flush (NS) 0.9 % injection 10 mL  10 mL Intravenous PRN Cammie Sickle, MD   10 mL at 07/07/18 0830  . sodium chloride flush (NS) 0.9 % injection 10 mL  10 mL Intravenous PRN Charlaine Dalton R, MD   10 mL at 07/06/19 0839    PHYSICAL EXAMINATION: ECOG PERFORMANCE STATUS: 1 - Symptomatic but completely ambulatory  Temp (!) 96 F (35.6 C) (Tympanic)   Resp 18   Wt 165 lb (74.8 kg)   BMI 29.23 kg/m   Filed Weights   07/06/19 0845  Weight: 165 lb (74.8 kg)   Lifestyle  Physical activity  . Days per week: Not on file  . Minutes per session: Not on file     LABORATORY DATA:  I have reviewed the data as listed    Component Value Date/Time   NA 137 07/06/2019 0831   K 3.6 07/06/2019 0831   CL 103 07/06/2019 0831   CO2 26 07/06/2019 0831   GLUCOSE 171 (H) 07/06/2019 0831   BUN 16 07/06/2019 0831   CREATININE 0.82 07/06/2019 0831   CALCIUM 8.5 (L) 07/06/2019 0831   PROT 6.9 07/06/2019 0831   ALBUMIN 3.9 07/06/2019 0831   AST 22 07/06/2019 0831   ALT 19 07/06/2019 0831   ALKPHOS 63 07/06/2019 0831   BILITOT 0.6 07/06/2019 0831   GFRNONAA >60 07/06/2019 0831   GFRAA >60 07/06/2019 0831    No results found for: SPEP, UPEP  Lab Results  Component Value Date   WBC 5.3 07/06/2019   NEUTROABS 3.8 07/06/2019   HGB 13.9 07/06/2019   HCT 44.5 07/06/2019   MCV 93.3 07/06/2019   PLT 278 07/06/2019      Chemistry      Component Value Date/Time   NA 137 07/06/2019 0831   K 3.6 07/06/2019 0831   CL 103 07/06/2019 0831   CO2 26 07/06/2019 0831   BUN 16 07/06/2019  0831   CREATININE  0.82 07/06/2019 0831      Component Value Date/Time   CALCIUM 8.5 (L) 07/06/2019 0831   ALKPHOS 63 07/06/2019 0831   AST 22 07/06/2019 0831   ALT 19 07/06/2019 0831   BILITOT 0.6 07/06/2019 0831       Physical Exam  Constitutional: She is oriented to person, place, and time and well-developed, well-nourished, and in no distress.  Patient is accompanied by husband.  In a wheelchair.  HENT:  Head: Normocephalic and atraumatic.  Mouth/Throat: Oropharynx is clear and moist. No oropharyngeal exudate.  Eyes: Pupils are equal, round, and reactive to light.  Neck: Normal range of motion. Neck supple.  Cardiovascular: Normal rate and regular rhythm.  Pulmonary/Chest: Effort normal and breath sounds normal. No respiratory distress. She has no wheezes.  Abdominal: Soft. Bowel sounds are normal. She exhibits no distension and no mass. There is no abdominal tenderness. There is no rebound and no guarding.  Musculoskeletal: Normal range of motion.        General: No tenderness or edema.  Neurological: She is alert and oriented to person, place, and time.  Skin: Skin is warm.  Psychiatric: Affect normal.    LABORATORY DATA:  I have reviewed the data as listed    Component Value Date/Time   NA 137 07/06/2019 0831   K 3.6 07/06/2019 0831   CL 103 07/06/2019 0831   CO2 26 07/06/2019 0831   GLUCOSE 171 (H) 07/06/2019 0831   BUN 16 07/06/2019 0831   CREATININE 0.82 07/06/2019 0831   CALCIUM 8.5 (L) 07/06/2019 0831   PROT 6.9 07/06/2019 0831   ALBUMIN 3.9 07/06/2019 0831   AST 22 07/06/2019 0831   ALT 19 07/06/2019 0831   ALKPHOS 63 07/06/2019 0831   BILITOT 0.6 07/06/2019 0831   GFRNONAA >60 07/06/2019 0831   GFRAA >60 07/06/2019 0831    No results found for: SPEP, UPEP  Lab Results  Component Value Date   WBC 5.3 07/06/2019   NEUTROABS 3.8 07/06/2019   HGB 13.9 07/06/2019   HCT 44.5 07/06/2019   MCV 93.3 07/06/2019   PLT 278 07/06/2019      Chemistry      Component  Value Date/Time   NA 137 07/06/2019 0831   K 3.6 07/06/2019 0831   CL 103 07/06/2019 0831   CO2 26 07/06/2019 0831   BUN 16 07/06/2019 0831   CREATININE 0.82 07/06/2019 0831      Component Value Date/Time   CALCIUM 8.5 (L) 07/06/2019 0831   ALKPHOS 63 07/06/2019 0831   AST 22 07/06/2019 0831   ALT 19 07/06/2019 0831   BILITOT 0.6 07/06/2019 0831       RADIOGRAPHIC STUDIES: I have personally reviewed the radiological images as listed and agreed with the findings in the report. No results found.   ASSESSMENT & PLAN:  Carcinoma of overlapping sites of left breast in female, estrogen receptor negative ([place]) #Metastatic breast cancer ER [name] HER-2/neu positive.  28th OCT 2020- CT scan chest and pelvis NED/stable sclerosis of the left manubrium.  Stable.  # Proceed with Herceptin Perjeta.  Labs today reviewed; acceptable for treatment today.  Will repeat CT scans in end of feb.   # Drop in ejection fraction-July 2020- 58; 11/11/ 2020-slightly lower- 52%; asymptomatic monitor for now. Will repeat in January 2020.  If worsening-discussed regarding using Herceptin alone versus switching to TDM 1.  # Hypocalemia- 8.5; on ca+vitD-monitor for now  #Chronic back and hip pain--stable  on asper patch/ tramadol prn.  #Left breast/incisional pain-stable  # Dementia/ chronic debility/fatigue-do not suspect any metastatic disease to the brain.  Monitor closely.  # "rectal incontinence"-no diarrhea; do not suspect any organic cause at this time.  Suspect this is from patient's inattention to her bowel function.  Had discussion with Sheena/breast navigator.   I spoke at length with the patient's family/husband,Dr.Stoltz- regarding the patient's clinical status/plan of care.  Family agreement.   DISPOSITION:  #Herceptin-Perjeta today # Follow up in 3 weeks-MD;labs-cbc/cmp; Herceptin-Perjeta Dr.B    No orders of the defined types were placed in this encounter.  All questions were  answered. The patient knows to call the clinic with any problems, questions or concerns.      Cammie Sickle, MD 07/06/2019 9:47 AM

## 2019-07-06 NOTE — Progress Notes (Signed)
Met patient and her husband today during her follow up with Dr. Rogue Bussing.  No complaints.  Husband states a couple incidences of bowel incontinence that he contributes to a long distance of getting to the bathroom.  No needs at this time.

## 2019-07-06 NOTE — Assessment & Plan Note (Addendum)
#  Metastatic breast cancer ER [name] HER-2/neu positive.  28th OCT 2020- CT scan chest and pelvis NED/stable sclerosis of the left manubrium.  Stable.  # Proceed with Herceptin Perjeta.  Labs today reviewed; acceptable for treatment today.  Will repeat CT scans in end of feb.   # Drop in ejection fraction-July 2020- 58; 11/11/ 2020-slightly lower- 52%; asymptomatic monitor for now. Will repeat in January 2020.  If worsening-discussed regarding using Herceptin alone versus switching to TDM 1.  # Hypocalemia- 8.5; on ca+vitD-monitor for now  #Chronic back and hip pain--stable   on asper patch/ tramadol prn.  #Left breast/incisional pain-stable  # Dementia/ chronic debility/fatigue-do not suspect any metastatic disease to the brain.  Monitor closely.  # "rectal incontinence"-no diarrhea; do not suspect any organic cause at this time.  Suspect this is from patient's inattention to her bowel function.  Had discussion with Sheena/breast navigator.   I spoke at length with the patient's family/husband,Dr.Rottenberg- regarding the patient's clinical status/plan of care.  Family agreement.   DISPOSITION:  #Herceptin-Perjeta today # Follow up in 3 weeks-MD;labs-cbc/cmp; Herceptin-Perjeta Dr.B

## 2019-07-06 NOTE — Addendum Note (Signed)
Addended by: Jarrett Ables D on: 07/06/2019 10:00 AM   Modules accepted: Orders

## 2019-07-26 ENCOUNTER — Encounter: Payer: Self-pay | Admitting: *Deleted

## 2019-07-27 ENCOUNTER — Inpatient Hospital Stay: Payer: Medicare Other

## 2019-07-27 ENCOUNTER — Other Ambulatory Visit: Payer: Self-pay

## 2019-07-27 ENCOUNTER — Inpatient Hospital Stay (HOSPITAL_BASED_OUTPATIENT_CLINIC_OR_DEPARTMENT_OTHER): Payer: Medicare Other | Admitting: Internal Medicine

## 2019-07-27 VITALS — BP 125/62 | HR 68 | Temp 97.5°F | Resp 18

## 2019-07-27 DIAGNOSIS — C50812 Malignant neoplasm of overlapping sites of left female breast: Secondary | ICD-10-CM

## 2019-07-27 DIAGNOSIS — Z5112 Encounter for antineoplastic immunotherapy: Secondary | ICD-10-CM | POA: Diagnosis not present

## 2019-07-27 DIAGNOSIS — Z171 Estrogen receptor negative status [ER-]: Secondary | ICD-10-CM

## 2019-07-27 LAB — CBC WITH DIFFERENTIAL/PLATELET
Abs Immature Granulocytes: 0.02 10*3/uL (ref 0.00–0.07)
Basophils Absolute: 0 10*3/uL (ref 0.0–0.1)
Basophils Relative: 1 %
Eosinophils Absolute: 0.2 10*3/uL (ref 0.0–0.5)
Eosinophils Relative: 3 %
HCT: 44 % (ref 36.0–46.0)
Hemoglobin: 13.7 g/dL (ref 12.0–15.0)
Immature Granulocytes: 0 %
Lymphocytes Relative: 18 %
Lymphs Abs: 1.1 10*3/uL (ref 0.7–4.0)
MCH: 29.3 pg (ref 26.0–34.0)
MCHC: 31.1 g/dL (ref 30.0–36.0)
MCV: 94 fL (ref 80.0–100.0)
Monocytes Absolute: 0.3 10*3/uL (ref 0.1–1.0)
Monocytes Relative: 6 %
Neutro Abs: 4.2 10*3/uL (ref 1.7–7.7)
Neutrophils Relative %: 72 %
Platelets: 278 10*3/uL (ref 150–400)
RBC: 4.68 MIL/uL (ref 3.87–5.11)
RDW: 13.3 % (ref 11.5–15.5)
WBC: 5.9 10*3/uL (ref 4.0–10.5)
nRBC: 0 % (ref 0.0–0.2)

## 2019-07-27 LAB — COMPREHENSIVE METABOLIC PANEL
ALT: 23 U/L (ref 0–44)
AST: 27 U/L (ref 15–41)
Albumin: 4 g/dL (ref 3.5–5.0)
Alkaline Phosphatase: 67 U/L (ref 38–126)
Anion gap: 10 (ref 5–15)
BUN: 17 mg/dL (ref 8–23)
CO2: 25 mmol/L (ref 22–32)
Calcium: 8.7 mg/dL — ABNORMAL LOW (ref 8.9–10.3)
Chloride: 103 mmol/L (ref 98–111)
Creatinine, Ser: 0.76 mg/dL (ref 0.44–1.00)
GFR calc Af Amer: >60 mL/min (ref >60)
GFR calc non Af Amer: >60 mL/min (ref >60)
Glucose, Bld: 175 mg/dL — ABNORMAL HIGH (ref 70–99)
Potassium: 3.7 mmol/L (ref 3.5–5.1)
Sodium: 138 mmol/L (ref 135–145)
Total Bilirubin: 0.7 mg/dL (ref 0.3–1.2)
Total Protein: 6.9 g/dL (ref 6.5–8.1)

## 2019-07-27 MED ORDER — DIPHENHYDRAMINE HCL 50 MG/ML IJ SOLN
12.5000 mg | Freq: Once | INTRAMUSCULAR | Status: AC
  Administered 2019-07-27: 10:00:00 12.5 mg via INTRAVENOUS
  Filled 2019-07-27: qty 1

## 2019-07-27 MED ORDER — TRASTUZUMAB-DKST CHEMO 150 MG IV SOLR
450.0000 mg | Freq: Once | INTRAVENOUS | Status: AC
  Administered 2019-07-27: 450 mg via INTRAVENOUS
  Filled 2019-07-27: qty 21.43

## 2019-07-27 MED ORDER — SODIUM CHLORIDE 0.9 % IV SOLN
420.0000 mg | Freq: Once | INTRAVENOUS | Status: AC
  Administered 2019-07-27: 11:00:00 420 mg via INTRAVENOUS
  Filled 2019-07-27: qty 14

## 2019-07-27 MED ORDER — HEPARIN SOD (PORK) LOCK FLUSH 100 UNIT/ML IV SOLN
500.0000 [IU] | Freq: Once | INTRAVENOUS | Status: AC
  Administered 2019-07-27: 500 [IU] via INTRAVENOUS
  Filled 2019-07-27: qty 5

## 2019-07-27 MED ORDER — SODIUM CHLORIDE 0.9% FLUSH
10.0000 mL | INTRAVENOUS | Status: DC | PRN
  Administered 2019-07-27: 09:00:00 10 mL via INTRAVENOUS
  Filled 2019-07-27: qty 10

## 2019-07-27 MED ORDER — ACETAMINOPHEN 325 MG PO TABS
650.0000 mg | ORAL_TABLET | Freq: Once | ORAL | Status: AC
  Administered 2019-07-27: 10:00:00 650 mg via ORAL
  Filled 2019-07-27: qty 2

## 2019-07-27 MED ORDER — HEPARIN SOD (PORK) LOCK FLUSH 100 UNIT/ML IV SOLN
500.0000 [IU] | Freq: Once | INTRAVENOUS | Status: DC | PRN
  Filled 2019-07-27: qty 5

## 2019-07-27 MED ORDER — SODIUM CHLORIDE 0.9 % IV SOLN
Freq: Once | INTRAVENOUS | Status: AC
  Filled 2019-07-27: qty 250

## 2019-07-27 NOTE — Progress Notes (Signed)
Pt tolerated infusion well. Pt observed for 30 minutes post infusion. Pt and VS stable at discharge.

## 2019-07-27 NOTE — Assessment & Plan Note (Addendum)
#  Metastatic breast cancer ER [name] HER-2/neu positive.  28th OCT 2020- CT scan chest and pelvis NED/stable sclerosis of the left manubrium. Stable.    # Proceed with Herceptin Perjeta.  Labs today reviewed; acceptable for treatment today.  Will order scans at next visit.   # Drop in ejection fraction-July 2020- 58; 11/11/ 2020-slightly lower- 52%; asymptomatic monitor for now. Will order at next visit.   If worsening-discussed regarding using Herceptin alone versus switching to TDM 1 vs chemo holiday.   # Hypocalemia- 8.7; on ca+vitD; improving.  # Chronic back and hip pain/ gait instability- stable   on asper patch/ tramadol prn.  # Dementia/ chronic debility/fatigue- STABLE.  # Borderline low BP-asymptomatic.  Monitor for now;  check  bp at home; and inform us if running low.   I spoke at length with the patient's family/husband,Dr.Joa- regarding the patient's clinical status/plan of care.  Family agreement.   DISPOSITION:  #Herceptin-Perjeta today # Follow up in 3 weeks-MD;labs-cbc/cmp; Herceptin-Perjeta Dr.B

## 2019-07-27 NOTE — Progress Notes (Signed)
[facility] OFFICE PROGRESS NOTE  Patient Care Team: [name], [name], MD (General Surgery) Requested, [name]   Oncology History Overview Note  # June 2016- LEFT BREAST CA;  [name] ; [name] ; [name]; Her 2 Neu POS; [facility] from July OF 2016; [name]; adjuvant Herceptin [ Finished July 2017]; AUG 2017- Neratinib x5 days; DISCON sec to diarrhea  # MID OCT 2018- Right breast mass-Bx- ER/PR-NEG; Her 2 NEU POSITIVE 1~2.5cm;  [?NEW primary]  # MID-OCT 2018-METASTATIC RECURRENT-oh sternal mass; Left Ax LN [Bx]/periportal LN  # OCT 25th 2018- TAXOL-HERCEPTIN-PERJETA; Jan 2019- CT PR; continue HP only; on HOLD since NOV 2019--sec to drop in EF/Sieziues  # July 15th 2020- RE-STARTED HP   -------------------------------------------------------------------  # MUGA scan- July 28th 2017- 67%.  December 2019-EF 53%; January 2020 EF-52%  # chronic gait/balance issues  # Seizures/petit-mal/ Dr.Shah-Keppra [name] [id number]]  # June 2017- left breast Bx- fat necrosis [[name]  # july 2017-  BRCA 1& 2- NEG.   MOLECULAR TESTING- F ONE- TPS- 0%;  ERB2 amplification; PI3K/RET amplification Others**  --------------------------------------------------    DIAGNOSIS: [ OCT 2018]- REC/MET- BREAST CA ER/PR-NEG; her 2 POS  STAGE: 4  ;GOALS: Palliative  CURRENT/MOST RECENT THERAPY- Herceptin-Perjeta [C]    Carcinoma of overlapping sites of left breast in female, estrogen receptor negative ([place])     INTERVAL HISTORY: Patient a poor historian given dementia.   ENVY MENO 78 y.o.  female pleasant patient above history of metastatic breast cancer on Herceptin plus Perjeta-is here for follow-up.  As per the husband patient continues to have gait instability issues.  Chronic back pain/right hip  pain.  Not any worse.  No new lumps or bumps.  No swelling in the legs.  No shortness of breath.  Review of Systems  Constitutional: Positive for malaise/fatigue. Negative for chills, diaphoresis, fever and weight loss.  HENT: Negative for nosebleeds and sore throat.   Eyes: Negative for double vision.  Respiratory: Negative for cough, hemoptysis, sputum production, shortness of breath and wheezing.   Cardiovascular: Negative for chest pain, palpitations, orthopnea and leg swelling.  Gastrointestinal: Negative for abdominal pain, blood in stool, constipation, diarrhea, heartburn, melena, nausea and vomiting.  Genitourinary: Negative for dysuria, frequency and urgency.  Musculoskeletal: Positive for back pain. Negative for joint pain.  Skin: Negative.  Negative for itching and rash.  Neurological: Negative for dizziness, tingling, focal weakness, weakness and headaches.  Endo/Heme/Allergies: Does not bruise/bleed easily.  Psychiatric/Behavioral: Negative for depression. The patient does not have insomnia.         PAST MEDICAL HISTORY :  Past Medical History:  Diagnosis Date  . Arthritis   . Brain tumor ([place]) 1995   meningeoma  . Breast cancer ([place]) 2018   left breast; surgery 01/11/15 with Dr. Bary Castilla; path with invasive mammary and DCIS  . Breast cancer of upper-inner quadrant of left female breast ([place]) 12/15/14   Completed radiation end of December and finished chemotherapy 2 weeks ago, Left breast invasive mammary carcinoma, T1cN24mc (1.5 cm); Grade 3, [name], [name], [name], .  .Marland KitchenCataract    bilat   . DDD (degenerative disc disease), lumbar    Lumbar, previously evaluated by Dr. [place]arnestine Leys . [place]   . Fibrocystic breast disease  prior biopsy  . Glaucoma   . 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 Hearing loss   . History of cancer chemotherapy   . History of radiation therapy   . Hyperlipidemia, unspecified    . Imbalance   . Memory impairment    seen by Dr Manuella Ghazi  possible post crainiotomy from radiation  . 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  . Numbness and tingling    right hand   . Osteoarthritis   . Osteoporosis, post-menopausal   . Personal history of chemotherapy   . Personal history of radiation therapy   . Shoulder pain, right   . Wears glasses     PAST SURGICAL HISTORY :   Past Surgical History:  Procedure Laterality Date  . APPENDECTOMY  1950  . BRAIN SURGERY  1995   left frontal/temporal  . BREAST BIOPSY Left 12/15/14   confirmed DCIS  . BREAST BIOPSY Left 01/08/2015   Procedure: BREAST BIOPSY WITH NEEDLE LOCALIZATION;  Surgeon: [name], MD;  Location: [facility] ORS;  Service: General;  Laterality: Left;  . BREAST EXCISIONAL BIOPSY Left 1997  . BREAST LUMPECTOMY Left 01/08/2015   Procedure: LUMPECTOMY;  Surgeon: [name], MD;  Location: [facility] OR[facility];  Service: General;  Laterality: Left;  . COLONOSCOPY  2010   Dr. [name]  . ORIF ANKLE FRACTURE Right 08/05/2017   Procedure: OPEN REDUCTION INTERNAL FIXATION (ORIF) ANKLE FRACTURE;  Surgeon: Earnestine Leys, MD;  Location: [facility] OR[facility];  Service: Orthopedics;  Laterality: Right;  . PORTACATH PLACEMENT Right 01/16/2015   Procedure: INSERTION PORT-A-CATH;  Surgeon: [name], MD;  Location: [facility] OR[facility];  Service: General;  Laterality: Right;  . SENTINEL NODE BIOPSY Left 01/16/2015   Procedure: SENTINEL NODE BIOPSY;  Surgeon: [name], MD;  Location: [facility];  Service: General;  Laterality: Left;  . TOTAL HIP ARTHROPLASTY Right 09/04/2015   Procedure: RIGHT TOTAL HIP ARTHROPLASTY ANTERIOR APPROACH;  Surgeon: [name], MD;  Location: [facility];  Service: Orthopedics;  Laterality: Right;    FAMILY HISTORY :   Family History  Problem Relation Age of Onset  . Breast cancer Sister 71  . Addison's disease Mother   . Hyperthyroidism Mother    . Osteoarthritis Mother   . Stroke Father   . Heart disease Father   . High blood pressure Father     SOCIAL HISTORY:   Social History   Tobacco Use  . Smoking status: Former Smoker    Packs/day: 0.25    Years: 5.00    Pack years: 1.25    Types: Cigarettes    Quit date: 07/28/1972    Years since quitting: 47.0  . Smokeless tobacco: Never Used  Substance Use Topics  . Alcohol use: Yes    Alcohol/week: 1.0 - 2.0 standard drinks    Types: 1 - 2 Glasses of wine per week    Comment: 1 Glass Wine / Night  . Drug use: No    ALLERGIES:  is allergic to donepezil.  MEDICATIONS:  Current Outpatient Medications  Medication Sig Dispense Refill  . acetaminophen (TYLENOL) 325 MG tablet Take 650 mg by mouth every 4 (four) hours as needed. Pain / increased temp.    . bimatoprost (LUMIGAN) 0.01 % SOLN Lumigan 0.01 % eye drops  INT 1 GTT IN OS HS    . Calcium Carbonate-Vitamin D (CALTRATE 600+D PO) Take 1 tablet by  mouth daily.    . celecoxib (CELEBREX) 200 MG capsule Take 200 mg by mouth daily.     . Cetirizine HCl (ZYRTEC ALLERGY PO) Take 1 tablet by mouth as needed (allergies).    . Cholecalciferol (VITAMIN D-3) 5000 units TABS Take 1 tablet by mouth daily.    . hydrocortisone 2.5 % cream APPLY  AA BID FOR UP TO 2 WEEKS EACH MONTH PRN    . levETIRAcetam (KEPPRA) 250 MG tablet 1 tablet daily at bedtime  5  . naproxen sodium (ALEVE) 220 MG tablet Take 220 mg by mouth daily as needed (pain).    Marland Kitchen VITAMIN D PO Take by mouth. 1000 units    . vitamin E 400 UNIT capsule Take 400 Units by mouth daily.     . prochlorperazine (COMPAZINE) 10 MG tablet Take 1 tablet (10 mg total) every 6 (six) hours as needed by mouth for nausea or vomiting. 40 tablet 1  . traMADol (ULTRAM) 50 MG tablet Take 1 tablet (50 mg total) by mouth 2 (two) times daily as needed for moderate pain. (Patient not taking: Reported on 07/27/2019) 60 tablet 0   No current facility-administered medications for this visit.    Facility-Administered Medications Ordered in Other Visits  Medication Dose Route Frequency Provider Last Rate Last Admin  . heparin lock flush 100 unit/mL  500 Units Intravenous Once Charlaine Dalton R, MD      . sodium chloride flush (NS) 0.9 % injection 10 mL  10 mL Intravenous PRN Cammie Sickle, MD   10 mL at 07/07/18 0830  . sodium chloride flush (NS) 0.9 % injection 10 mL  10 mL Intravenous PRN Cammie Sickle, MD   10 mL at 07/27/19 0839    PHYSICAL EXAMINATION: ECOG PERFORMANCE STATUS: 1 - Symptomatic but completely ambulatory  BP 98/60 (BP Location: Left Arm, Patient Position: Sitting, Cuff Size: Normal)   Pulse 77   Temp (!) 95.6 F (35.3 C) (Tympanic)   Resp 20   Ht 5' 3" (1.6 m)   Wt 165 lb 8 oz (75.1 kg)   BMI 29.32 kg/m   Filed Weights   07/27/19 0927  Weight: 165 lb 8 oz (75.1 kg)     Physical Activity:   . Days of Exercise per Week: Not on file  . Minutes of Exercise per Session: Not on file     LABORATORY DATA:  I have reviewed the data as listed    Component Value Date/Time   NA 138 07/27/2019 0830   K 3.7 07/27/2019 0830   CL 103 07/27/2019 0830   CO2 25 07/27/2019 0830   GLUCOSE 175 (H) 07/27/2019 0830   BUN 17 07/27/2019 0830   CREATININE 0.76 07/27/2019 0830   CALCIUM 8.7 (L) 07/27/2019 0830   PROT 6.9 07/27/2019 0830   ALBUMIN 4.0 07/27/2019 0830   AST 27 07/27/2019 0830   ALT 23 07/27/2019 0830   ALKPHOS 67 07/27/2019 0830   BILITOT 0.7 07/27/2019 0830   GFRNONAA >60 07/27/2019 0830   GFRAA >60 07/27/2019 0830    No results found for: SPEP, UPEP  Lab Results  Component Value Date   WBC 5.9 07/27/2019   NEUTROABS 4.2 07/27/2019   HGB 13.7 07/27/2019   HCT 44.0 07/27/2019   MCV 94.0 07/27/2019   PLT 278 07/27/2019      Chemistry      Component Value Date/Time   NA 138 07/27/2019 0830   K 3.7 07/27/2019 0830   CL 103 07/27/2019  0830   CO2 25 07/27/2019 0830   BUN 17 07/27/2019 0830   CREATININE 0.76  07/27/2019 0830      Component Value Date/Time   CALCIUM 8.7 (L) 07/27/2019 0830   ALKPHOS 67 07/27/2019 0830   AST 27 07/27/2019 0830   ALT 23 07/27/2019 0830   BILITOT 0.7 07/27/2019 0830       Physical Exam  Constitutional: She is oriented to person, place, and time and well-developed, well-nourished, and in no distress.  Patient is accompanied by husband.  In a wheelchair.  HENT:  Head: Normocephalic and atraumatic.  Mouth/Throat: Oropharynx is clear and moist. No oropharyngeal exudate.  Eyes: Pupils are equal, round, and reactive to light.  Cardiovascular: Normal rate and regular rhythm.  Pulmonary/Chest: Effort normal and breath sounds normal. No respiratory distress. She has no wheezes.  Abdominal: Soft. Bowel sounds are normal. She exhibits no distension and no mass. There is no abdominal tenderness. There is no rebound and no guarding.  Musculoskeletal:        General: No tenderness or edema. Normal range of motion.     Cervical back: Normal range of motion and neck supple.  Neurological: She is alert and oriented to person, place, and time.  Skin: Skin is warm.  Psychiatric: Affect normal.    LABORATORY DATA:  I have reviewed the data as listed    Component Value Date/Time   NA 138 07/27/2019 0830   K 3.7 07/27/2019 0830   CL 103 07/27/2019 0830   CO2 25 07/27/2019 0830   GLUCOSE 175 (H) 07/27/2019 0830   BUN 17 07/27/2019 0830   CREATININE 0.76 07/27/2019 0830   CALCIUM 8.7 (L) 07/27/2019 0830   PROT 6.9 07/27/2019 0830   ALBUMIN 4.0 07/27/2019 0830   AST 27 07/27/2019 0830   ALT 23 07/27/2019 0830   ALKPHOS 67 07/27/2019 0830   BILITOT 0.7 07/27/2019 0830   GFRNONAA >60 07/27/2019 0830   GFRAA >60 07/27/2019 0830    No results found for: SPEP, UPEP  Lab Results  Component Value Date   WBC 5.9 07/27/2019   NEUTROABS 4.2 07/27/2019   HGB 13.7 07/27/2019   HCT 44.0 07/27/2019   MCV 94.0 07/27/2019   PLT 278 07/27/2019      Chemistry       Component Value Date/Time   NA 138 07/27/2019 0830   K 3.7 07/27/2019 0830   CL 103 07/27/2019 0830   CO2 25 07/27/2019 0830   BUN 17 07/27/2019 0830   CREATININE 0.76 07/27/2019 0830      Component Value Date/Time   CALCIUM 8.7 (L) 07/27/2019 0830   ALKPHOS 67 07/27/2019 0830   AST 27 07/27/2019 0830   ALT 23 07/27/2019 0830   BILITOT 0.7 07/27/2019 0830       RADIOGRAPHIC STUDIES: I have personally reviewed the radiological images as listed and agreed with the findings in the report. No results found.   ASSESSMENT & PLAN:  Carcinoma of overlapping sites of left breast in female, estrogen receptor negative ([place]) #Metastatic breast cancer ER [name] HER-2/neu positive.  28th OCT 2020- CT scan chest and pelvis NED/stable sclerosis of the left manubrium. Stable.    # Proceed with Herceptin Perjeta.  Labs today reviewed; acceptable for treatment today.  Will order scans at next visit.   # Drop in ejection fraction-July 2020- 58; 11/11/ 2020-slightly lower- 52%; asymptomatic monitor for now. Will order at next visit.   If worsening-discussed regarding using Herceptin alone versus switching  to TDM 1 vs chemo holiday.   # Hypocalemia- 8.7; on ca+vitD; improving.  # Chronic back and hip pain/ gait instability- stable   on asper patch/ tramadol prn.  # Dementia/ chronic debility/fatigue- STABLE.  # Borderline low BP-asymptomatic.  Monitor for now;  check  bp at home; and inform us if running low.   I spoke at length with the patient's family/husband,Dr.Peeler- regarding the patient's clinical status/plan of care.  Family agreement.   DISPOSITION:  #Herceptin-Perjeta today # Follow up in 3 weeks-MD;labs-cbc/cmp; Herceptin-Perjeta Dr.B    Orders Placed This Encounter  Procedures  . CBC with Differential    Standing Status:   Standing    Number of Occurrences:   20    Standing Expiration Date:   07/26/2020  . Comprehensive metabolic panel    Standing Status:    Standing    Number of Occurrences:   20    Standing Expiration Date:   07/26/2020   All questions were answered. The patient knows to call the clinic with any problems, questions or concerns.      Cammie Sickle, MD 07/27/2019 9:28 AM

## 2019-08-01 ENCOUNTER — Encounter: Payer: Self-pay | Admitting: Internal Medicine

## 2019-08-16 ENCOUNTER — Other Ambulatory Visit: Payer: Self-pay

## 2019-08-16 NOTE — Progress Notes (Signed)
Patient pre screened for office appointment, no questions or concerns today. Patient reminded of upcoming appointment time and date. 

## 2019-08-17 ENCOUNTER — Inpatient Hospital Stay (HOSPITAL_BASED_OUTPATIENT_CLINIC_OR_DEPARTMENT_OTHER): Payer: Medicare Other | Admitting: Internal Medicine

## 2019-08-17 ENCOUNTER — Inpatient Hospital Stay: Payer: Medicare Other | Attending: Internal Medicine

## 2019-08-17 ENCOUNTER — Inpatient Hospital Stay: Payer: Medicare Other

## 2019-08-17 ENCOUNTER — Other Ambulatory Visit: Payer: Self-pay

## 2019-08-17 VITALS — BP 112/58 | HR 83 | Temp 95.3°F | Resp 20 | Ht 63.0 in | Wt 165.0 lb

## 2019-08-17 DIAGNOSIS — N390 Urinary tract infection, site not specified: Secondary | ICD-10-CM | POA: Diagnosis not present

## 2019-08-17 DIAGNOSIS — C50812 Malignant neoplasm of overlapping sites of left female breast: Secondary | ICD-10-CM | POA: Diagnosis present

## 2019-08-17 DIAGNOSIS — F0390 Unspecified dementia without behavioral disturbance: Secondary | ICD-10-CM | POA: Insufficient documentation

## 2019-08-17 DIAGNOSIS — R5381 Other malaise: Secondary | ICD-10-CM | POA: Diagnosis not present

## 2019-08-17 DIAGNOSIS — Z5181 Encounter for therapeutic drug level monitoring: Secondary | ICD-10-CM | POA: Diagnosis not present

## 2019-08-17 DIAGNOSIS — Z171 Estrogen receptor negative status [ER-]: Secondary | ICD-10-CM

## 2019-08-17 DIAGNOSIS — Z79899 Other long term (current) drug therapy: Secondary | ICD-10-CM

## 2019-08-17 DIAGNOSIS — Z5112 Encounter for antineoplastic immunotherapy: Secondary | ICD-10-CM | POA: Insufficient documentation

## 2019-08-17 DIAGNOSIS — R5383 Other fatigue: Secondary | ICD-10-CM | POA: Insufficient documentation

## 2019-08-17 LAB — COMPREHENSIVE METABOLIC PANEL
ALT: 21 U/L (ref 0–44)
AST: 24 U/L (ref 15–41)
Albumin: 4.1 g/dL (ref 3.5–5.0)
Alkaline Phosphatase: 63 U/L (ref 38–126)
Anion gap: 8 (ref 5–15)
BUN: 16 mg/dL (ref 8–23)
CO2: 25 mmol/L (ref 22–32)
Calcium: 8.5 mg/dL — ABNORMAL LOW (ref 8.9–10.3)
Chloride: 102 mmol/L (ref 98–111)
Creatinine, Ser: 1.12 mg/dL — ABNORMAL HIGH (ref 0.44–1.00)
GFR calc Af Amer: 55 mL/min — ABNORMAL LOW (ref >60)
GFR calc non Af Amer: 47 mL/min — ABNORMAL LOW (ref >60)
Glucose, Bld: 161 mg/dL — ABNORMAL HIGH (ref 70–99)
Potassium: 3.9 mmol/L (ref 3.5–5.1)
Sodium: 135 mmol/L (ref 135–145)
Total Bilirubin: 0.4 mg/dL (ref 0.3–1.2)
Total Protein: 7 g/dL (ref 6.5–8.1)

## 2019-08-17 LAB — CBC WITH DIFFERENTIAL/PLATELET
Abs Immature Granulocytes: 0.02 10*3/uL (ref 0.00–0.07)
Basophils Absolute: 0 10*3/uL (ref 0.0–0.1)
Basophils Relative: 0 %
Eosinophils Absolute: 0.2 10*3/uL (ref 0.0–0.5)
Eosinophils Relative: 4 %
HCT: 44.2 % (ref 36.0–46.0)
Hemoglobin: 13.8 g/dL (ref 12.0–15.0)
Immature Granulocytes: 0 %
Lymphocytes Relative: 18 %
Lymphs Abs: 1.1 10*3/uL (ref 0.7–4.0)
MCH: 29.4 pg (ref 26.0–34.0)
MCHC: 31.2 g/dL (ref 30.0–36.0)
MCV: 94 fL (ref 80.0–100.0)
Monocytes Absolute: 0.4 10*3/uL (ref 0.1–1.0)
Monocytes Relative: 6 %
Neutro Abs: 4.4 10*3/uL (ref 1.7–7.7)
Neutrophils Relative %: 72 %
Platelets: 302 10*3/uL (ref 150–400)
RBC: 4.7 MIL/uL (ref 3.87–5.11)
RDW: 13.3 % (ref 11.5–15.5)
WBC: 6.1 10*3/uL (ref 4.0–10.5)
nRBC: 0 % (ref 0.0–0.2)

## 2019-08-17 MED ORDER — SODIUM CHLORIDE 0.9 % IV SOLN
420.0000 mg | Freq: Once | INTRAVENOUS | Status: AC
  Administered 2019-08-17: 11:00:00 420 mg via INTRAVENOUS
  Filled 2019-08-17: qty 14

## 2019-08-17 MED ORDER — SODIUM CHLORIDE 0.9% FLUSH
10.0000 mL | Freq: Once | INTRAVENOUS | Status: AC
  Administered 2019-08-17: 10 mL via INTRAVENOUS
  Filled 2019-08-17: qty 10

## 2019-08-17 MED ORDER — ACETAMINOPHEN 325 MG PO TABS
650.0000 mg | ORAL_TABLET | Freq: Once | ORAL | Status: AC
  Administered 2019-08-17: 10:00:00 650 mg via ORAL
  Filled 2019-08-17: qty 2

## 2019-08-17 MED ORDER — SODIUM CHLORIDE 0.9 % IV SOLN
Freq: Once | INTRAVENOUS | Status: AC
  Filled 2019-08-17: qty 250

## 2019-08-17 MED ORDER — HEPARIN SOD (PORK) LOCK FLUSH 100 UNIT/ML IV SOLN
500.0000 [IU] | Freq: Once | INTRAVENOUS | Status: AC
  Administered 2019-08-17: 12:00:00 500 [IU] via INTRAVENOUS
  Filled 2019-08-17: qty 5

## 2019-08-17 MED ORDER — TRASTUZUMAB-DKST CHEMO 150 MG IV SOLR
450.0000 mg | Freq: Once | INTRAVENOUS | Status: AC
  Administered 2019-08-17: 11:00:00 450 mg via INTRAVENOUS
  Filled 2019-08-17: qty 21.43

## 2019-08-17 MED ORDER — DIPHENHYDRAMINE HCL 50 MG/ML IJ SOLN
12.5000 mg | Freq: Once | INTRAMUSCULAR | Status: AC
  Administered 2019-08-17: 12.5 mg via INTRAVENOUS
  Filled 2019-08-17: qty 1

## 2019-08-17 MED ORDER — HEPARIN SOD (PORK) LOCK FLUSH 100 UNIT/ML IV SOLN
INTRAVENOUS | Status: AC
  Filled 2019-08-17: qty 5

## 2019-08-17 NOTE — Progress Notes (Signed)
[place] OFFICE PROGRESS NOTE  Patient Care Team: Michele Hector, [name] as PCP - General (Internal Medicine) [name] [name], [name] (General Surgery) Requested, [name]   Oncology History Overview Note  # June 2016- LEFT BREAST CA;  invasive carcinoma of breast T1c n1MIC M0 [Michele/p Lumpec ; [name]] ; [name]; Her 2 Neu POS; [name] from July OF 2016; Michele/p RT; adjuvant Herceptin [ Finished July 2017]; AUG 2017- Neratinib x5 days; DISCON sec to diarrhea  # MID OCT 2018- Right breast mass-Bx- ER/PR-NEG; Her 2 NEU POSITIVE 1~2.5cm;  [?NEW primary]  # MID-OCT 2018-METASTATIC RECURRENT-oh sternal mass; Left Ax LN [Bx]/periportal LN  # OCT 25th 2018- TAXOL-HERCEPTIN-PERJETA; Jan 2019- CT PR; continue HP only; on HOLD since NOV 2019--sec to drop in EF/Sieziues  # July 15th 2020- RE-STARTED HP   -------------------------------------------------------------------  # MUGA scan- July 28th 2017- 67%.  December 2019-EF 53%; January 2020 EF-52%  # chronic gait/balance issues  # Seizures/petit-mal/ [name]-Keppra [name]]  # June 2017- left breast Bx- fat necrosis [[name]]  # july 2017-  BRCA 1& 2- NEG.   MOLECULAR TESTING- F ONE- TPS- 0%;  ERB2 amplification; PI3K/RET amplification Others**  # PALLIATIVE CARE: P  --------------------------------------------------    DIAGNOSIS: [ OCT 6761]- REC/MET- BREAST CA ER/PR-NEG; her 2 POS  STAGE: 4  ;GOALS: Palliative  CURRENT/MOST RECENT THERAPY- Herceptin-Perjeta [C]    Carcinoma of overlapping sites of left breast in female, estrogen receptor negative ([place])     INTERVAL HISTORY: Patient a poor historian given dementia.   [name] 79 y.o.  female pleasant patient above history of metastatic breast cancer on Herceptin plus Perjeta-is here for follow-up.  In the interim patient was evaluated by urology for bladder infection.   Patient increased because of urination.  She is currently on Bactrim.  Symptoms improved.  As per the husband patient had good holidays.  However noted by family to have slow but steady decline in cognitive function.  However no significant behavioral issues.  Chronic back pain/hip pain.  Not any worse.  No falls.  No swelling in the legs.  No unusual shortness of breath or cough.  Review of Systems  Constitutional: Positive for malaise/fatigue. Negative for chills, diaphoresis, fever and weight loss.  HENT: Negative for nosebleeds and sore throat.   Eyes: Negative for double vision.  Respiratory: Negative for cough, hemoptysis, sputum production, shortness of breath and wheezing.   Cardiovascular: Negative for chest pain, palpitations, orthopnea and leg swelling.  Gastrointestinal: Negative for abdominal pain, blood in stool, constipation, diarrhea, heartburn, melena, nausea and vomiting.  Genitourinary: Negative for dysuria, frequency and urgency.  Musculoskeletal: Positive for back pain. Negative for joint pain.  Skin: Negative.  Negative for itching and rash.  Neurological: Negative for dizziness, tingling, focal weakness, weakness and headaches.  Endo/Heme/Allergies: Does not bruise/bleed easily.  Psychiatric/Behavioral: Positive for memory loss. Negative for depression. The patient does not have insomnia.         PAST MEDICAL HISTORY :  Past Medical History:  Diagnosis Date  . Arthritis   . 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 Breast cancer ([place]) 2018   left breast; surgery 01/11/15 with Michele. Bary [name]; path with invasive mammary and DCIS  . Breast cancer of upper-inner quadrant of left female breast ([place]) 12/15/14   Completed radiation end of December and finished chemotherapy 2 weeks ago, Left breast invasive mammary carcinoma,  T1cN64mc (1.5 cm); Grade 3, [name], [name], [name], .  .Marland KitchenCataract    bilat   . DDD (degenerative disc disease),  lumbar    Lumbar, previously evaluated by Michele. HEarnestine [name] . [place]   . Fibrocystic breast disease    prior biopsy  . Glaucoma   . 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 Hearing loss   . History of cancer chemotherapy   . History of radiation therapy   . Hyperlipidemia, unspecified   . Imbalance   . Memory impairment    seen by Michele SManuella [name] possible post [name] from radiation  . 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  . Numbness and tingling    right hand   . Osteoarthritis   . Osteoporosis, post-menopausal   . Personal history of chemotherapy   . Personal history of radiation therapy   . Shoulder pain, right   . Wears glasses     PAST SURGICAL HISTORY :   Past Surgical History:  Procedure Laterality Date  . APPENDECTOMY  1950  . BRAIN SURGERY  1995   left frontal/temporal  . BREAST BIOPSY Left 12/15/14   confirmed DCIS  . BREAST BIOPSY Left 01/08/2015   Procedure: BREAST BIOPSY WITH NEEDLE LOCALIZATION;  Surgeon: Michele Bellow [name];  Location: [facility] ORS;  Service: General;  Laterality: Left;  . BREAST EXCISIONAL BIOPSY Left 1997  . BREAST LUMPECTOMY Left 01/08/2015   Procedure: LUMPECTOMY;  Surgeon: Michele Bellow [name];  Location: [facility] ORS;  Service: General;  Laterality: Left;  . COLONOSCOPY  2010   Michele. ETiffany [name] . ORIF ANKLE FRACTURE Right 08/05/2017   Procedure: OPEN REDUCTION INTERNAL FIXATION (ORIF) ANKLE FRACTURE;  Surgeon: MEarnestine Leys [name];  Location: [facility] ORS;  Service: Orthopedics;  Laterality: Right;  . PORTACATH PLACEMENT Right 01/16/2015   Procedure: INSERTION PORT-A-CATH;  Surgeon: Michele Bellow [name];  Location: [facility] ORS;  Service: General;  Laterality: Right;  . SENTINEL NODE BIOPSY Left 01/16/2015   Procedure: SENTINEL NODE BIOPSY;  Surgeon: Michele Bellow [name];  Location: [facility] ORS;  Service: General;  Laterality: Left;  . TOTAL HIP ARTHROPLASTY Right  09/04/2015   Procedure: RIGHT TOTAL HIP ARTHROPLASTY ANTERIOR APPROACH;  Surgeon: [name];  Location: [facility];  Service: Orthopedics;  Laterality: Right;    FAMILY HISTORY :   Family History  Problem Relation Age of Onset  . Breast cancer Sister 477 . Addison'Michele disease Mother   . Hyperthyroidism Mother   . Osteoarthritis Mother   . Stroke Father   . Heart disease Father   . High blood pressure Father     SOCIAL HISTORY:   Social History   Tobacco Use  . Smoking status: Former Smoker    Packs/day: 0.25    Years: 5.00    Pack years: 1.25    Types: Cigarettes    Quit date: 07/28/1972    Years since quitting: 47.0  . Smokeless tobacco: Never Used  Substance Use Topics  . Alcohol use: Yes    Alcohol/week: 1.0 - 2.0 standard drinks    Types: 1 - 2 Glasses of wine per week    Comment: 1 Glass Wine / Night  . Drug use: No    ALLERGIES:  is allergic to donepezil.  MEDICATIONS:  Current Outpatient Medications  Medication Sig Dispense Refill  .  acetaminophen (TYLENOL) 325 MG tablet Take 650 mg by mouth every 4 (four) hours as needed. Pain / increased temp.    . bimatoprost (LUMIGAN) 0.01 % SOLN Lumigan 0.01 % eye drops  INT 1 GTT IN OS HS    . Calcium Carbonate-Vitamin D (CALTRATE 600+D PO) Take 1 tablet by mouth daily.    . celecoxib (CELEBREX) 200 MG capsule Take 200 mg by mouth daily.     . Cetirizine HCl (ZYRTEC ALLERGY PO) Take 1 tablet by mouth as needed (allergies).    . Cholecalciferol (VITAMIN D-3) 5000 units TABS Take 1 tablet by mouth daily.    . hydrocortisone 2.5 % cream APPLY  AA BID FOR UP TO 2 WEEKS EACH MONTH PRN    . levETIRAcetam (KEPPRA) 250 MG tablet 1 tablet daily at bedtime  5  . naproxen sodium (ALEVE) 220 MG tablet Take 220 mg by mouth daily as needed (pain).    . traMADol (ULTRAM) 50 MG tablet Take 1 tablet (50 mg total) by mouth 2 (two) times daily as needed for moderate pain. 60 tablet 0  . VITAMIN D PO Take by mouth. 1000 units    . vitamin Michele  400 UNIT capsule Take 400 Units by mouth daily.     . prochlorperazine (COMPAZINE) 10 MG tablet Take 1 tablet (10 mg total) every 6 (six) hours as needed by mouth for nausea or vomiting. 40 tablet 1   No current facility-administered medications for this visit.   Facility-Administered Medications Ordered in Other Visits  Medication Dose Route Frequency Provider Last Rate Last Admin  . sodium chloride flush (NS) 0.9 % injection 10 mL  10 mL Intravenous PRN Cammie Sickle, [name]   10 mL at 07/07/18 0830    PHYSICAL EXAMINATION: ECOG PERFORMANCE STATUS: 1 - Symptomatic but completely ambulatory  BP (!) 112/58 Comment: manual recheck  Pulse 83   Temp (!) 95.3 F (35.2 C) (Tympanic)   Resp 20   Ht 5' 3"  (1.6 m)   Wt 165 lb (74.8 kg)   BMI 29.23 kg/m   Filed Weights   08/17/19 0900  Weight: 165 lb (74.8 kg)     Physical Activity:   . Days of Exercise per Week: Not on file  . Minutes of Exercise per Session: Not on file     LABORATORY DATA:  I have reviewed the data as listed    Component Value Date/Time   NA 135 08/17/2019 0838   K 3.9 08/17/2019 0838   CL 102 08/17/2019 0838   CO2 25 08/17/2019 0838   GLUCOSE 161 (H) 08/17/2019 0838   BUN 16 08/17/2019 0838   CREATININE 1.12 (H) 08/17/2019 0838   CALCIUM 8.5 (L) 08/17/2019 0838   PROT 7.0 08/17/2019 0838   ALBUMIN 4.1 08/17/2019 0838   AST 24 08/17/2019 0838   ALT 21 08/17/2019 0838   ALKPHOS 63 08/17/2019 0838   BILITOT 0.4 08/17/2019 0838   GFRNONAA 47 (L) 08/17/2019 0838   GFRAA 55 (L) 08/17/2019 0838    No results found for: SPEP, UPEP  Lab Results  Component Value Date   WBC 6.1 08/17/2019   NEUTROABS 4.4 08/17/2019   HGB 13.8 08/17/2019   HCT 44.2 08/17/2019   MCV 94.0 08/17/2019   PLT 302 08/17/2019      Chemistry      Component Value Date/Time   NA 135 08/17/2019 0838   K 3.9 08/17/2019 0838   CL 102 08/17/2019 0838   CO2 25 08/17/2019  0838   BUN 16 08/17/2019 0838   CREATININE 1.12  (H) 08/17/2019 0838      Component Value Date/Time   CALCIUM 8.5 (L) 08/17/2019 0838   ALKPHOS 63 08/17/2019 0838   AST 24 08/17/2019 0838   ALT 21 08/17/2019 0838   BILITOT 0.4 08/17/2019 0838       Physical Exam  Constitutional: She is oriented to person, place, and time and well-developed, well-nourished, and in no distress.  Patient is accompanied by husband.  In a wheelchair.  HENT:  Head: Normocephalic and atraumatic.  Mouth/Throat: Oropharynx is clear and moist. No oropharyngeal exudate.  Eyes: Pupils are equal, round, and reactive to light.  Cardiovascular: Normal rate and regular rhythm.  Pulmonary/Chest: Effort normal and breath sounds normal. No respiratory distress. She has no wheezes.  Abdominal: Soft. Bowel sounds are normal. She exhibits no distension and no mass. There is no abdominal tenderness. There is no rebound and no guarding.  Musculoskeletal:        General: No tenderness or edema. Normal range of motion.     Cervical back: Normal range of motion and neck supple.  Neurological: She is alert and oriented to person, place, and time.  Skin: Skin is warm.  Psychiatric: Affect normal.    LABORATORY DATA:  I have reviewed the data as listed    Component Value Date/Time   NA 135 08/17/2019 0838   K 3.9 08/17/2019 0838   CL 102 08/17/2019 0838   CO2 25 08/17/2019 0838   GLUCOSE 161 (H) 08/17/2019 0838   BUN 16 08/17/2019 0838   CREATININE 1.12 (H) 08/17/2019 0838   CALCIUM 8.5 (L) 08/17/2019 0838   PROT 7.0 08/17/2019 0838   ALBUMIN 4.1 08/17/2019 0838   AST 24 08/17/2019 0838   ALT 21 08/17/2019 0838   ALKPHOS 63 08/17/2019 0838   BILITOT 0.4 08/17/2019 0838   GFRNONAA 47 (L) 08/17/2019 0838   GFRAA 55 (L) 08/17/2019 0838    No results found for: SPEP, UPEP  Lab Results  Component Value Date   WBC 6.1 08/17/2019   NEUTROABS 4.4 08/17/2019   HGB 13.8 08/17/2019   HCT 44.2 08/17/2019   MCV 94.0 08/17/2019   PLT 302 08/17/2019       Chemistry      Component Value Date/Time   NA 135 08/17/2019 0838   K 3.9 08/17/2019 0838   CL 102 08/17/2019 0838   CO2 25 08/17/2019 0838   BUN 16 08/17/2019 0838   CREATININE 1.12 (H) 08/17/2019 0838      Component Value Date/Time   CALCIUM 8.5 (L) 08/17/2019 0838   ALKPHOS 63 08/17/2019 0838   AST 24 08/17/2019 0838   ALT 21 08/17/2019 0838   BILITOT 0.4 08/17/2019 0838       RADIOGRAPHIC STUDIES: I have personally reviewed the radiological images as listed and agreed with the findings in the report. No results found.   ASSESSMENT & PLAN:  Carcinoma of overlapping sites of left breast in female, estrogen receptor negative ([place]) #Metastatic breast cancer ER [name] HER-2/neu positive.  28th OCT 2020- CT scan chest and pelvis NED/stable sclerosis of the left manubrium. STABLE.     # Proceed with Herceptin Perjeta.  Labs today reviewed; acceptable for treatment today.  Will order scans today.   # Drop in ejection fraction-July 2020- 58; 11/11/ 2020-slightly lower- 52%; asymptomatic monitor for now. MUGA ordered today.  If worsening-we will have to look for alternative options.  # Hypocalemia- 8.7;  on ca+vitD; improving.  # UTI- Michele/p Batrim- improving. [GFR-47]-recommend increasing fluids.  # Chronic back and hip pain/ gait instability- stable   on asper patch/ tramadol prn.  # Dementia/ chronic debility/fatigue-overall STABLE.  # I discussed regarding Covid-19 precautions.  I reviewed the vaccine effectiveness and potential side effects in detail.  Also discussed long-term effectiveness and safety profile are unclear at this time.  I discussed December, 2020 [employer] position statement-that all patients are recommended COVID-19 vaccinations [when available]-as long as they do not have allergy to components of the vaccine.  However, I think the benefits of the vaccination outweigh the potential risks. Re: QJFHL-45 vaccination. Michele/p 1 vaccine  DISPOSITION:  #Herceptin-Perjeta  today # Follow up in 3 weeks-[name];labs-cbc/cmp; Herceptin-Perjeta; CT C/A/P MicheleB    Orders Placed This Encounter  Procedures  . NM Cardiac [facility]    Standing Status:   Future    Standing Expiration Date:   08/16/2020    Order Specific Question:   Will [facility] be the location of this test?    Answer:   Yes    Order Specific Question:   caspofungin (CANCIDAS) frequency    Answer:   Q 24H    Order Specific Question:   Preferred imaging location?    Answer:   [facility]    Order Specific Question:   Radiology Contrast Protocol - do NOT remove file path    Answer:   \\[email or web address]    Order Specific Question:   ** REASON FOR EXAM (FREE TEXT)    Answer:   cardiotoxic chemo  . CT Chest W Contrast    Standing Status:   Future    Standing Expiration Date:   08/16/2020    Order Specific Question:   ** REASON FOR EXAM (FREE TEXT)    Answer:   breast cancer    Order Specific Question:   If indicated for the ordered procedure, I authorize the administration of contrast media per Radiology protocol    Answer:   Yes    Order Specific Question:   Preferred imaging location?    Answer:   [facility]    Order Specific Question:   Radiology Contrast Protocol - do NOT remove file path    Answer:   \\[email or web address]  . CT ABDOMEN PELVIS W CONTRAST    Standing Status:   Future    Standing Expiration Date:   08/16/2020    Order Specific Question:   If indicated for the ordered procedure, I authorize the administration of contrast media per Radiology protocol    Answer:   Yes    Order Specific Question:   Preferred imaging location?    Answer:   [facility]    Order Specific Question:   Radiology Contrast Protocol - do NOT remove file path    Answer:   \\[email or web address]    Order Specific Question:   ** REASON FOR EXAM (FREE TEXT)    Answer:   breast cancer   All questions were answered.  The patient knows to call the clinic with any problems, questions or concerns.      Cammie Sickle, [name] 08/17/2019 12:13 PM

## 2019-08-17 NOTE — Assessment & Plan Note (Addendum)
#  Metastatic breast cancer ER [name] HER-2/neu positive.  28th OCT 2020- CT scan chest and pelvis NED/stable sclerosis of the left manubrium. STABLE.     # Proceed with Herceptin Perjeta.  Labs today reviewed; acceptable for treatment today.  Will order scans today.   # Drop in ejection fraction-July 2020- 58; 11/11/ 2020-slightly lower- 52%; asymptomatic monitor for now. MUGA ordered today.  If worsening-we will have to look for alternative options.  # Hypocalemia- 8.7; on ca+vitD; improving.  # UTI- s/p Batrim- improving. [GFR-47]-recommend increasing fluids.  # Chronic back and hip pain/ gait instability- stable   on asper patch/ tramadol prn.  # Dementia/ chronic debility/fatigue-overall STABLE.  # I discussed regarding Covid-19 precautions.  I reviewed the vaccine effectiveness and potential side effects in detail.  Also discussed long-term effectiveness and safety profile are unclear at this time.  I discussed December, 2020 [facility] position statement-that all patients are recommended COVID-19 vaccinations [when available]-as long as they do not have allergy to components of the vaccine.  However, I think the benefits of the vaccination outweigh the potential risks. Re: FOADL-25 vaccination. S/p 1 vaccine  DISPOSITION:  #Herceptin-Perjeta today # Follow up in 3 weeks-MD;labs-cbc/cmp; Herceptin-Perjeta; CT C/A/P Dr.B

## 2019-08-29 ENCOUNTER — Ambulatory Visit: Payer: Medicare Other

## 2019-08-31 ENCOUNTER — Other Ambulatory Visit: Payer: Self-pay

## 2019-08-31 ENCOUNTER — Encounter: Payer: Self-pay | Admitting: Obstetrics & Gynecology

## 2019-08-31 ENCOUNTER — Ambulatory Visit (INDEPENDENT_AMBULATORY_CARE_PROVIDER_SITE_OTHER): Payer: Medicare Other | Admitting: Obstetrics & Gynecology

## 2019-08-31 ENCOUNTER — Other Ambulatory Visit (HOSPITAL_COMMUNITY)
Admission: RE | Admit: 2019-08-31 | Discharge: 2019-08-31 | Disposition: A | Discharge status: Home / Self Care | Payer: Medicare Other | Source: Ambulatory Visit | Attending: Obstetrics & Gynecology | Admitting: Obstetrics & Gynecology

## 2019-08-31 VITALS — BP 120/80 | Ht 65.0 in | Wt 164.0 lb

## 2019-08-31 DIAGNOSIS — N3941 Urge incontinence: Secondary | ICD-10-CM | POA: Diagnosis not present

## 2019-08-31 DIAGNOSIS — Z124 Encounter for screening for malignant neoplasm of cervix: Secondary | ICD-10-CM

## 2019-08-31 DIAGNOSIS — N8110 Cystocele, unspecified: Secondary | ICD-10-CM

## 2019-08-31 DIAGNOSIS — N3281 Overactive bladder: Secondary | ICD-10-CM | POA: Diagnosis not present

## 2019-08-31 DIAGNOSIS — N852 Hypertrophy of uterus: Secondary | ICD-10-CM | POA: Diagnosis not present

## 2019-08-31 NOTE — Progress Notes (Signed)
Consultant Nash Mantis Reason Recurrent UTI and Urinary concerns  Cystocele/Rectocele Patient is a 79 yo WF who complains of a min vaginal pressure yet urinary urgency and leakage.  She has memory problems, and husband Legrand Como helps with discussion and helps with patient care at home.  Not so much frequency and no significant nocturia.  Noted to have 2 UTI over the past 18 mos, and so felt needed to assess female anatomy.  Prior h/o fibroids.. Problems have been gradual in onset.  Denies vag bleeding, pain.  Wears a pad daily.  UTIs were treated quickly and without complications.  PMHx: She  has a past medical history of Arthritis, Brain tumor ([place]) (1995), Breast cancer ([place]) (2018), Breast cancer of upper-inner quadrant of left female breast ([place]) (12/15/14), Cataract, DDD (degenerative disc disease), lumbar, Dementia ([place]), Fibrocystic breast disease, Glaucoma, Hard of hearing, Hearing loss, History of cancer chemotherapy, History of radiation therapy, Hyperlipidemia, unspecified, Imbalance, Memory impairment, Meningioma ([facility]), Numbness and tingling, Osteoarthritis, Osteoporosis, post-menopausal, Personal history of chemotherapy, Personal history of radiation therapy, Shoulder pain, right, and Wears glasses. Also,  has a past surgical history that includes Appendectomy (1950); Brain surgery (1995); Colonoscopy (2010); Breast lumpectomy (Left, 01/08/2015); Sentinel node biopsy (Left, 01/16/2015); Portacath placement (Right, 01/16/2015); Total hip arthroplasty (Right, 09/04/2015); ORIF ankle fracture (Right, 08/05/2017); Breast excisional biopsy (Left, 1997); Breast biopsy (Left, 12/15/14); and Breast biopsy (Left, 01/08/2015)., family history includes Addison's disease in her mother; Breast cancer (age of onset: 36) in her sister; Heart disease in her father; High blood pressure in her father; Hyperthyroidism in her mother; Osteoarthritis in her mother; Stroke in her father.,  reports that she quit smoking about  47 years ago. Her smoking use included cigarettes. She has a 1.25 pack-year smoking history. She has never used smokeless tobacco. She reports current alcohol use of about 1.0 - 2.0 standard drinks of alcohol per week. She reports that she does not use drugs.  She has a current medication list which includes the following prescription(s): acetaminophen, lumigan, calcium carbonate-vitamin d, celecoxib, cetirizine hcl, hydrocortisone, levetiracetam, tramadol, vitamin e, vitamin d-3, naproxen sodium, prochlorperazine, and vitamin d, and the following Facility-Administered Medications: sodium chloride flush. Also, is allergic to donepezil.  Review of Systems  Constitutional: Negative for chills, fever and malaise/fatigue.  HENT: Positive for hearing loss. Negative for congestion, sinus pain and sore throat.   Eyes: Negative for blurred vision and pain.  Respiratory: Negative for cough and wheezing.   Cardiovascular: Negative for chest pain and leg swelling.  Gastrointestinal: Negative for abdominal pain, constipation, diarrhea, heartburn, nausea and vomiting.  Genitourinary: Positive for urgency. Negative for dysuria, frequency and hematuria.  Musculoskeletal: Positive for joint pain. Negative for back pain, myalgias and neck pain.  Skin: Negative for itching and rash.  Neurological: Positive for dizziness. Negative for tremors and weakness.  Endo/Heme/Allergies: Does not bruise/bleed easily.  Psychiatric/Behavioral: Positive for memory loss. Negative for depression. The patient is not nervous/anxious and does not have insomnia.     Objective: BP 120/80   Ht 5\' 5"  (1.651 m)   Wt 164 lb (74.4 kg)   BMI 27.29 kg/m  Physical Exam Constitutional:      General: She is not in acute distress.    Appearance: She is well-developed.  Genitourinary:     Pelvic exam was performed with patient supine.     Urethra, bladder, vagina and uterus normal.     No vaginal erythema or bleeding.     No cervical  motion tenderness, discharge,  polyp or nabothian cyst.     Uterus is mobile.     Uterus is not enlarged.     No uterine mass detected.    Uterus is midaxial.     No right or left adnexal mass present.     Right adnexa not tender.     Left adnexa not tender.     Genitourinary Comments: Uterus slightly enlarged, min prolapse. Gr 1 cystocele  HENT:     Head: Normocephalic and atraumatic.     Nose: Nose normal.  Abdominal:     General: There is no distension.     Palpations: Abdomen is soft.     Tenderness: There is no abdominal tenderness.  Musculoskeletal:        General: Normal range of motion.  Neurological:     Mental Status: She is alert and oriented to person, place, and time.     Cranial Nerves: No cranial nerve deficit.  Skin:    General: Skin is warm and dry.  Psychiatric:        Attention and Perception: Attention normal.        Mood and Affect: Mood and affect normal.        Speech: Speech normal.        Behavior: Behavior normal.        Thought Content: Thought content normal.        Judgment: Judgment normal.     ASSESSMENT/PLAN:    Problem List Items Addressed This Visit    Overactive bladder      Urge incontinence of urine       Screening for cervical cancer       Relevant Orders   Cytology - PAP    No need for pessary, surgery at this time for prolase or cystocele Consider meds or urology interventions if urgency and/or leakage worsens  A total of 30 minutes were spent face-to-face with the patient as well as preparation, review, communication, and documentation during this encounter.    Barnett Applebaum, MD, [facility], [facility] 08/31/2019  4:12 PM

## 2019-09-01 ENCOUNTER — Ambulatory Visit
Admission: RE | Admit: 2019-09-01 | Discharge: 2019-09-01 | Disposition: A | Discharge status: Home / Self Care | Payer: Medicare Other | Source: Ambulatory Visit | Attending: Internal Medicine | Admitting: Internal Medicine

## 2019-09-01 DIAGNOSIS — Z171 Estrogen receptor negative status [ER-]: Secondary | ICD-10-CM | POA: Insufficient documentation

## 2019-09-01 DIAGNOSIS — C50812 Malignant neoplasm of overlapping sites of left female breast: Secondary | ICD-10-CM | POA: Diagnosis present

## 2019-09-01 MED ORDER — IOHEXOL 300 MG/ML  SOLN
75.0000 mL | Freq: Once | INTRAMUSCULAR | Status: AC | PRN
  Administered 2019-09-01: 75 mL via INTRAVENOUS

## 2019-09-02 ENCOUNTER — Other Ambulatory Visit: Payer: Self-pay

## 2019-09-02 ENCOUNTER — Ambulatory Visit
Admission: RE | Admit: 2019-09-02 | Discharge: 2019-09-02 | Disposition: A | Discharge status: Home / Self Care | Payer: Medicare Other | Source: Ambulatory Visit | Attending: Internal Medicine | Admitting: Internal Medicine

## 2019-09-02 DIAGNOSIS — Z5181 Encounter for therapeutic drug level monitoring: Secondary | ICD-10-CM | POA: Insufficient documentation

## 2019-09-02 DIAGNOSIS — R931 Abnormal findings on diagnostic imaging of heart and coronary circulation: Secondary | ICD-10-CM | POA: Diagnosis not present

## 2019-09-02 DIAGNOSIS — C50812 Malignant neoplasm of overlapping sites of left female breast: Secondary | ICD-10-CM | POA: Diagnosis not present

## 2019-09-02 DIAGNOSIS — Z79899 Other long term (current) drug therapy: Secondary | ICD-10-CM | POA: Insufficient documentation

## 2019-09-02 DIAGNOSIS — Z171 Estrogen receptor negative status [ER-]: Secondary | ICD-10-CM | POA: Insufficient documentation

## 2019-09-02 MED ORDER — TECHNETIUM TC 99M-LABELED RED BLOOD CELLS IV KIT
20.0000 | PACK | Freq: Once | INTRAVENOUS | Status: AC | PRN
  Administered 2019-09-02: 10:00:00 21.13 via INTRAVENOUS

## 2019-09-05 LAB — CYTOLOGY - PAP: Diagnosis: NEGATIVE

## 2019-09-07 ENCOUNTER — Encounter: Payer: Self-pay | Admitting: Internal Medicine

## 2019-09-07 ENCOUNTER — Other Ambulatory Visit: Payer: Self-pay

## 2019-09-07 ENCOUNTER — Inpatient Hospital Stay: Payer: Medicare Other

## 2019-09-07 ENCOUNTER — Inpatient Hospital Stay (HOSPITAL_BASED_OUTPATIENT_CLINIC_OR_DEPARTMENT_OTHER): Payer: Medicare Other | Admitting: Internal Medicine

## 2019-09-07 ENCOUNTER — Inpatient Hospital Stay: Payer: Medicare Other | Attending: Internal Medicine

## 2019-09-07 DIAGNOSIS — Z171 Estrogen receptor negative status [ER-]: Secondary | ICD-10-CM

## 2019-09-07 DIAGNOSIS — C50812 Malignant neoplasm of overlapping sites of left female breast: Secondary | ICD-10-CM | POA: Insufficient documentation

## 2019-09-07 DIAGNOSIS — C7951 Secondary malignant neoplasm of bone: Secondary | ICD-10-CM | POA: Diagnosis not present

## 2019-09-07 DIAGNOSIS — R2689 Other abnormalities of gait and mobility: Secondary | ICD-10-CM | POA: Insufficient documentation

## 2019-09-07 DIAGNOSIS — R5381 Other malaise: Secondary | ICD-10-CM | POA: Diagnosis not present

## 2019-09-07 DIAGNOSIS — Z5112 Encounter for antineoplastic immunotherapy: Secondary | ICD-10-CM | POA: Insufficient documentation

## 2019-09-07 LAB — CBC WITH DIFFERENTIAL/PLATELET
Abs Immature Granulocytes: 0.01 10*3/uL (ref 0.00–0.07)
Basophils Absolute: 0 10*3/uL (ref 0.0–0.1)
Basophils Relative: 1 %
Eosinophils Absolute: 0.2 10*3/uL (ref 0.0–0.5)
Eosinophils Relative: 4 %
HCT: 44.5 % (ref 36.0–46.0)
Hemoglobin: 13.9 g/dL (ref 12.0–15.0)
Immature Granulocytes: 0 %
Lymphocytes Relative: 21 %
Lymphs Abs: 1.1 10*3/uL (ref 0.7–4.0)
MCH: 29.4 pg (ref 26.0–34.0)
MCHC: 31.2 g/dL (ref 30.0–36.0)
MCV: 94.1 fL (ref 80.0–100.0)
Monocytes Absolute: 0.3 10*3/uL (ref 0.1–1.0)
Monocytes Relative: 6 %
Neutro Abs: 3.5 10*3/uL (ref 1.7–7.7)
Neutrophils Relative %: 68 %
Platelets: 295 10*3/uL (ref 150–400)
RBC: 4.73 MIL/uL (ref 3.87–5.11)
RDW: 13.2 % (ref 11.5–15.5)
WBC: 5.1 10*3/uL (ref 4.0–10.5)
nRBC: 0 % (ref 0.0–0.2)

## 2019-09-07 LAB — COMPREHENSIVE METABOLIC PANEL
ALT: 18 U/L (ref 0–44)
AST: 24 U/L (ref 15–41)
Albumin: 4.1 g/dL (ref 3.5–5.0)
Alkaline Phosphatase: 61 U/L (ref 38–126)
Anion gap: 10 (ref 5–15)
BUN: 15 mg/dL (ref 8–23)
CO2: 26 mmol/L (ref 22–32)
Calcium: 8.6 mg/dL — ABNORMAL LOW (ref 8.9–10.3)
Chloride: 100 mmol/L (ref 98–111)
Creatinine, Ser: 0.85 mg/dL (ref 0.44–1.00)
GFR calc Af Amer: >60 mL/min (ref >60)
GFR calc non Af Amer: >60 mL/min (ref >60)
Glucose, Bld: 164 mg/dL — ABNORMAL HIGH (ref 70–99)
Potassium: 3.5 mmol/L (ref 3.5–5.1)
Sodium: 136 mmol/L (ref 135–145)
Total Bilirubin: 0.6 mg/dL (ref 0.3–1.2)
Total Protein: 7.1 g/dL (ref 6.5–8.1)

## 2019-09-07 MED ORDER — HEPARIN SOD (PORK) LOCK FLUSH 100 UNIT/ML IV SOLN
INTRAVENOUS | Status: AC
  Filled 2019-09-07: qty 5

## 2019-09-07 MED ORDER — HEPARIN SOD (PORK) LOCK FLUSH 100 UNIT/ML IV SOLN
500.0000 [IU] | Freq: Once | INTRAVENOUS | Status: AC
  Administered 2019-09-07: 500 [IU] via INTRAVENOUS
  Filled 2019-09-07: qty 5

## 2019-09-07 MED ORDER — SODIUM CHLORIDE 0.9 % IV SOLN
Freq: Once | INTRAVENOUS | Status: AC
  Filled 2019-09-07: qty 250

## 2019-09-07 MED ORDER — SODIUM CHLORIDE 0.9% FLUSH
10.0000 mL | Freq: Once | INTRAVENOUS | Status: AC
  Administered 2019-09-07: 08:00:00 10 mL via INTRAVENOUS
  Filled 2019-09-07: qty 10

## 2019-09-07 MED ORDER — ACETAMINOPHEN 325 MG PO TABS
650.0000 mg | ORAL_TABLET | Freq: Once | ORAL | Status: AC
  Administered 2019-09-07: 09:00:00 650 mg via ORAL
  Filled 2019-09-07: qty 2

## 2019-09-07 MED ORDER — DIPHENHYDRAMINE HCL 50 MG/ML IJ SOLN
12.5000 mg | Freq: Once | INTRAMUSCULAR | Status: AC
  Administered 2019-09-07: 09:00:00 12.5 mg via INTRAVENOUS
  Filled 2019-09-07: qty 1

## 2019-09-07 MED ORDER — SODIUM CHLORIDE 0.9 % IV SOLN
420.0000 mg | Freq: Once | INTRAVENOUS | Status: AC
  Administered 2019-09-07: 420 mg via INTRAVENOUS
  Filled 2019-09-07: qty 14

## 2019-09-07 MED ORDER — TRASTUZUMAB-DKST CHEMO 150 MG IV SOLR
450.0000 mg | Freq: Once | INTRAVENOUS | Status: AC
  Administered 2019-09-07: 450 mg via INTRAVENOUS
  Filled 2019-09-07: qty 21.43

## 2019-09-07 NOTE — Progress Notes (Signed)
[place] OFFICE PROGRESS NOTE  Patient Care Team: [name], [name], MD (General Surgery) Requested, [name]   Oncology History Overview Note  # June 2016- LEFT BREAST CA;  [name] ; [name] ; [name]; Her 2 Neu POS; [place] from July OF 2016; [name]; adjuvant Herceptin [ Finished July 2017]; AUG 2017- Neratinib x5 days; DISCON sec to diarrhea  # MID OCT 2018- Right breast mass-Bx- ER/PR-NEG; Her 2 NEU POSITIVE 1~2.5cm;  [?NEW primary]  # MID-OCT 2018-METASTATIC RECURRENT-oh sternal mass; Left Ax LN [Bx]/periportal LN  # OCT 25th 2018- TAXOL-HERCEPTIN-PERJETA; Jan 2019- CT PR; continue HP only; on HOLD since NOV 2019--sec to drop in EF/Sieziues  # July 15th 2020- RE-STARTED HP   -------------------------------------------------------------------  # MUGA scan- July 28th 2017- 67%.  December 2019-EF 53%; January 2020 EF-52%  # chronic gait/balance issues  # Seizures/petit-mal/ Dr.Shah-Keppra [name] [id number]]  # June 2017- left breast Bx- fat necrosis [[name]  # july 2017-  BRCA 1& 2- NEG.   MOLECULAR TESTING- F ONE- TPS- 0%;  ERB2 amplification; PI3K/RET amplification Others**  # PALLIATIVE CARE: P  --------------------------------------------------    DIAGNOSIS: [ OCT 8299]- REC/MET- BREAST CA ER/PR-NEG; her 2 POS  STAGE: 4  ;GOALS: Palliative  CURRENT/MOST RECENT THERAPY- Herceptin-Perjeta [C]    Carcinoma of overlapping sites of left breast in female, estrogen receptor negative ([place])     INTERVAL HISTORY: Patient a poor historian given dementia.  Patient accompanied by husband.  [name] 79 y.o.  female pleasant patient above history of metastatic breast cancer on Herceptin plus Perjeta-is here for follow-up.  No fever no chills.  No worsening pain.   Continues to have declining cognitive function; continued debility.  No falls.  Chronic back pain/hip pain.  Not any worse.  No falls.  No swelling in the legs.  No unusual shortness of breath or cough.  Review of Systems  Constitutional: Positive for malaise/fatigue. Negative for chills, diaphoresis, fever and weight loss.  HENT: Negative for nosebleeds and sore throat.   Eyes: Negative for double vision.  Respiratory: Negative for cough, hemoptysis, sputum production, shortness of breath and wheezing.   Cardiovascular: Negative for chest pain, palpitations, orthopnea and leg swelling.  Gastrointestinal: Negative for abdominal pain, blood in stool, constipation, diarrhea, heartburn, melena, nausea and vomiting.  Genitourinary: Negative for dysuria, frequency and urgency.  Musculoskeletal: Positive for back pain. Negative for joint pain.  Skin: Negative.  Negative for itching and rash.  Neurological: Negative for dizziness, tingling, focal weakness, weakness and headaches.  Endo/Heme/Allergies: Does not bruise/bleed easily.  Psychiatric/Behavioral: Positive for memory loss. Negative for depression. The patient does not have insomnia.         PAST MEDICAL HISTORY :  Past Medical History:  Diagnosis Date  . Arthritis   . 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 Breast cancer ([place]) 2018   left breast; surgery 01/11/15 with Dr. Bary Castilla; path with invasive mammary and DCIS  . Breast cancer of upper-inner quadrant of left female breast ([place]) 12/15/14   Completed radiation end of December and finished chemotherapy 2 weeks ago, Left breast invasive mammary carcinoma, T1cN81mc (1.5 cm); Grade 3, [name], [name], [name], .  .Marland KitchenCataract    bilat   . DDD (degenerative disc disease),  lumbar    Lumbar, previously evaluated by Dr. Earnestine Leys  . Dementia ([place])   . Fibrocystic breast disease    prior biopsy  . Glaucoma   . Hard of hearing    wears hearing  aides bilat   . Hearing loss   . History of cancer chemotherapy   . History of radiation therapy   . Hyperlipidemia, unspecified   . Imbalance   . Memory impairment    seen by Dr Manuella Ghazi  possible post crainiotomy from radiation  . 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  . Numbness and tingling    right hand   . Osteoarthritis   . Osteoporosis, post-menopausal   . Personal history of chemotherapy   . Personal history of radiation therapy   . Shoulder pain, right   . Wears glasses     PAST SURGICAL HISTORY :   Past Surgical History:  Procedure Laterality Date  . APPENDECTOMY  1950  . BRAIN SURGERY  1995   left frontal/temporal  . BREAST BIOPSY Left 12/15/14   confirmed DCIS  . BREAST BIOPSY Left 01/08/2015   Procedure: BREAST BIOPSY WITH NEEDLE LOCALIZATION;  Surgeon: [name], MD;  Location: [facility] ORS;  Service: General;  Laterality: Left;  . BREAST EXCISIONAL BIOPSY Left 1997  . BREAST LUMPECTOMY Left 01/08/2015   Procedure: LUMPECTOMY;  Surgeon: [name], MD;  Location: [facility] ORS;  Service: General;  Laterality: Left;  . COLONOSCOPY  2010   Dr. [name]  . ORIF ANKLE FRACTURE Right 08/05/2017   Procedure: OPEN REDUCTION INTERNAL FIXATION (ORIF) ANKLE FRACTURE;  Surgeon: Earnestine Leys, MD;  Location: [facility] OR[facility];  Service: Orthopedics;  Laterality: Right;  . PORTACATH PLACEMENT Right 01/16/2015   Procedure: INSERTION PORT-A-CATH;  Surgeon: [name], MD;  Location: [facility] OR[facility];  Service: General;  Laterality: Right;  . SENTINEL NODE BIOPSY Left 01/16/2015   Procedure: SENTINEL NODE BIOPSY;  Surgeon: [name], MD;  Location: [facility] OR[facility];  Service: General;  Laterality: Left;  . TOTAL HIP ARTHROPLASTY Right 09/04/2015   Procedure: RIGHT TOTAL HIP ARTHROPLASTY ANTERIOR APPROACH;  Surgeon: [name], MD;  Location: [facility];  Service: Orthopedics;  Laterality: Right;    FAMILY HISTORY :    Family History  Problem Relation Age of Onset  . Breast cancer Sister 76  . Addison's disease Mother   . Hyperthyroidism Mother   . Osteoarthritis Mother   . Stroke Father   . Heart disease Father   . High blood pressure Father     SOCIAL HISTORY:   Social History   Tobacco Use  . Smoking status: Former Smoker    Packs/day: 0.25    Years: 5.00    Pack years: 1.25    Types: Cigarettes    Quit date: 07/28/1972    Years since quitting: 47.1  . Smokeless tobacco: Never Used  Substance Use Topics  . Alcohol use: Yes    Alcohol/week: 1.0 - 2.0 standard drinks    Types: 1 - 2 Glasses of wine per week    Comment: 1 Glass Wine / Night  . Drug use: No    ALLERGIES:  is allergic to donepezil.  MEDICATIONS:  Current Outpatient Medications  Medication Sig Dispense Refill  . acetaminophen (TYLENOL) 325 MG tablet Take 650 mg by mouth every 4 (four) hours as needed. Pain / increased temp.    . bimatoprost (LUMIGAN) 0.01 % SOLN Lumigan 0.01 %  eye drops  INT 1 GTT IN OS HS    . Calcium Carbonate-Vitamin D (CALTRATE 600+D PO) Take 1 tablet by mouth daily.    . celecoxib (CELEBREX) 200 MG capsule Take 200 mg by mouth daily.     . Cetirizine HCl (ZYRTEC ALLERGY PO) Take 1 tablet by mouth as needed (allergies).    . hydrocortisone 2.5 % cream APPLY  AA BID FOR UP TO 2 WEEKS EACH MONTH PRN    . levETIRAcetam (KEPPRA) 250 MG tablet 1 tablet daily at bedtime  5  . traMADol (ULTRAM) 50 MG tablet Take 1 tablet (50 mg total) by mouth 2 (two) times daily as needed for moderate pain. 60 tablet 0  . vitamin E 400 UNIT capsule Take 400 Units by mouth daily.      No current facility-administered medications for this visit.   Facility-Administered Medications Ordered in Other Visits  Medication Dose Route Frequency Provider Last Rate Last Admin  . sodium chloride flush (NS) 0.9 % injection 10 mL  10 mL Intravenous PRN Cammie Sickle, MD   10 mL at 07/07/18 0830    PHYSICAL  EXAMINATION: ECOG PERFORMANCE STATUS: 1 - Symptomatic but completely ambulatory  BP 110/68 (BP Location: Right Arm, Patient Position: Sitting, Cuff Size: Large)   Pulse 74   Temp (!) 96.1 F (35.6 C) (Tympanic)   Resp 20   Ht 5' 5"  (1.651 m)   Wt 164 lb (74.4 kg)   BMI 27.29 kg/m   Filed Weights   09/07/19 0834  Weight: 164 lb (74.4 kg)     Physical Activity:   . Days of Exercise per Week: Not on file  . Minutes of Exercise per Session: Not on file   .physical   LABORATORY DATA:  I have reviewed the data as listed    Component Value Date/Time   NA 136 09/07/2019 0824   K 3.5 09/07/2019 0824   CL 100 09/07/2019 0824   CO2 26 09/07/2019 0824   GLUCOSE 164 (H) 09/07/2019 0824   BUN 15 09/07/2019 0824   CREATININE 0.85 09/07/2019 0824   CALCIUM 8.6 (L) 09/07/2019 0824   PROT 7.1 09/07/2019 0824   ALBUMIN 4.1 09/07/2019 0824   AST 24 09/07/2019 0824   ALT 18 09/07/2019 0824   ALKPHOS 61 09/07/2019 0824   BILITOT 0.6 09/07/2019 0824   GFRNONAA >60 09/07/2019 0824   GFRAA >60 09/07/2019 0824    No results found for: SPEP, UPEP  Lab Results  Component Value Date   WBC 5.1 09/07/2019   NEUTROABS 3.5 09/07/2019   HGB 13.9 09/07/2019   HCT 44.5 09/07/2019   MCV 94.1 09/07/2019   PLT 295 09/07/2019      Chemistry      Component Value Date/Time   NA 136 09/07/2019 0824   K 3.5 09/07/2019 0824   CL 100 09/07/2019 0824   CO2 26 09/07/2019 0824   BUN 15 09/07/2019 0824   CREATININE 0.85 09/07/2019 0824      Component Value Date/Time   CALCIUM 8.6 (L) 09/07/2019 0824   ALKPHOS 61 09/07/2019 0824   AST 24 09/07/2019 0824   ALT 18 09/07/2019 0824   BILITOT 0.6 09/07/2019 0824       Physical Exam  Constitutional: She is oriented to person, place, and time and well-developed, well-nourished, and in no distress.  Patient is accompanied by husband.  In a wheelchair.  HENT:  Head: Normocephalic and atraumatic.  Mouth/Throat: Oropharynx is clear and moist.  No oropharyngeal exudate.  Eyes: Pupils are equal, round, and reactive to light.  Cardiovascular: Normal rate and regular rhythm.  Pulmonary/Chest: Effort normal and breath sounds normal. No respiratory distress. She has no wheezes.  Abdominal: Soft. Bowel sounds are normal. She exhibits no distension and no mass. There is no abdominal tenderness. There is no rebound and no guarding.  Musculoskeletal:        General: No tenderness or edema. Normal range of motion.     Cervical back: Normal range of motion and neck supple.  Neurological: She is alert and oriented to person, place, and time.  Skin: Skin is warm.  Psychiatric: Affect normal.    LABORATORY DATA:  I have reviewed the data as listed    Component Value Date/Time   NA 136 09/07/2019 0824   K 3.5 09/07/2019 0824   CL 100 09/07/2019 0824   CO2 26 09/07/2019 0824   GLUCOSE 164 (H) 09/07/2019 0824   BUN 15 09/07/2019 0824   CREATININE 0.85 09/07/2019 0824   CALCIUM 8.6 (L) 09/07/2019 0824   PROT 7.1 09/07/2019 0824   ALBUMIN 4.1 09/07/2019 0824   AST 24 09/07/2019 0824   ALT 18 09/07/2019 0824   ALKPHOS 61 09/07/2019 0824   BILITOT 0.6 09/07/2019 0824   GFRNONAA >60 09/07/2019 0824   GFRAA >60 09/07/2019 0824    No results found for: SPEP, UPEP  Lab Results  Component Value Date   WBC 5.1 09/07/2019   NEUTROABS 3.5 09/07/2019   HGB 13.9 09/07/2019   HCT 44.5 09/07/2019   MCV 94.1 09/07/2019   PLT 295 09/07/2019      Chemistry      Component Value Date/Time   NA 136 09/07/2019 0824   K 3.5 09/07/2019 0824   CL 100 09/07/2019 0824   CO2 26 09/07/2019 0824   BUN 15 09/07/2019 0824   CREATININE 0.85 09/07/2019 0824      Component Value Date/Time   CALCIUM 8.6 (L) 09/07/2019 0824   ALKPHOS 61 09/07/2019 0824   AST 24 09/07/2019 0824   ALT 18 09/07/2019 0824   BILITOT 0.6 09/07/2019 0824       RADIOGRAPHIC STUDIES: I have personally reviewed the radiological images as listed and agreed with the  findings in the report. No results found.   ASSESSMENT & PLAN:  Carcinoma of overlapping sites of left breast in female, estrogen receptor negative ([place]) #Metastatic breast cancer ER [name] HER-2/neu positive.  FEB 4th 2021-CT scan chest and pelvis NED- except slightly larger 8 mm [from previous 3m]- enlarged LN. Otherwise-stable sclerosis of the left manubrium. Overall STABLE.  # Proceed with Herceptin Perjeta.  Labs today reviewed; acceptable for treatment today.   # Drop in ejection fraction-FEB 2021- 51%- STABLE  # Chronic back and hip pain/ gait instability- stable   on asper patch/ tramadol prn.  #Debility/gait instability-limited mobility-this is overall clinically stable.   DISPOSITION:  #Herceptin-Perjeta today # Follow up in 3 weeks-MD;labs-cbc/cmp;BNP;  Herceptin-Perjeta; Dr.B # I reviewed the blood work- with the patient in detail; also reviewed the imaging independently [as summarized above]; and with the patient in detail.  Will reach out to Dr. BTollie Pizzafor his thoughts regarding the 8 mm focus of right axillary lymph node.  Cc; [name].      Orders Placed This Encounter  Procedures  . CBC with Differential    Standing Status:   Future    Standing Expiration Date:   09/06/2020  . Comprehensive metabolic panel  Standing Status:   Future    Standing Expiration Date:   09/06/2020  . Brain natriuretic peptide    Standing Status:   Future    Standing Expiration Date:   09/06/2020   All questions were answered. The patient knows to call the clinic with any problems, questions or concerns.      Cammie Sickle, MD 09/07/2019 1:12 PM

## 2019-09-07 NOTE — Assessment & Plan Note (Addendum)
#  Metastatic breast cancer ER [name] HER-2/neu positive.  FEB 4th 2021-CT scan chest and pelvis NED- except slightly larger 8 mm [from previous 66m]- enlarged LN. Otherwise-stable sclerosis of the left manubrium. Overall STABLE.  # Proceed with Herceptin Perjeta.  Labs today reviewed; acceptable for treatment today.   # Drop in ejection fraction-FEB 2021- 51%- STABLE  # Chronic back and hip pain/ gait instability- stable   on asper patch/ tramadol prn.  #Debility/gait instability-limited mobility-this is overall clinically stable.   DISPOSITION:  #Herceptin-Perjeta today # Follow up in 3 weeks-MD;labs-cbc/cmp;BNP;  Herceptin-Perjeta; Dr.B # I reviewed the blood work- with the patient in detail; also reviewed the imaging independently [as summarized above]; and with the patient in detail.  Will reach out to Dr. BTollie Pizzafor his thoughts regarding the 8 mm focus of right axillary lymph node.  Cc; [name].

## 2019-09-21 ENCOUNTER — Telehealth: Payer: Self-pay | Admitting: *Deleted

## 2019-09-21 NOTE — Telephone Encounter (Signed)
Left vm for patient's husband that the long term care attending MD statement form was signed by Dr. B and ready to be picked up.

## 2019-09-28 ENCOUNTER — Inpatient Hospital Stay: Payer: Medicare Other | Attending: Internal Medicine

## 2019-09-28 ENCOUNTER — Inpatient Hospital Stay (HOSPITAL_BASED_OUTPATIENT_CLINIC_OR_DEPARTMENT_OTHER): Payer: Medicare Other | Admitting: Internal Medicine

## 2019-09-28 ENCOUNTER — Inpatient Hospital Stay: Payer: Medicare Other

## 2019-09-28 ENCOUNTER — Other Ambulatory Visit: Payer: Self-pay

## 2019-09-28 DIAGNOSIS — R2689 Other abnormalities of gait and mobility: Secondary | ICD-10-CM | POA: Insufficient documentation

## 2019-09-28 DIAGNOSIS — F0390 Unspecified dementia without behavioral disturbance: Secondary | ICD-10-CM | POA: Diagnosis not present

## 2019-09-28 DIAGNOSIS — Z171 Estrogen receptor negative status [ER-]: Secondary | ICD-10-CM | POA: Diagnosis not present

## 2019-09-28 DIAGNOSIS — M1990 Unspecified osteoarthritis, unspecified site: Secondary | ICD-10-CM | POA: Insufficient documentation

## 2019-09-28 DIAGNOSIS — E785 Hyperlipidemia, unspecified: Secondary | ICD-10-CM | POA: Diagnosis not present

## 2019-09-28 DIAGNOSIS — Z5112 Encounter for antineoplastic immunotherapy: Secondary | ICD-10-CM | POA: Insufficient documentation

## 2019-09-28 DIAGNOSIS — H409 Unspecified glaucoma: Secondary | ICD-10-CM | POA: Diagnosis not present

## 2019-09-28 DIAGNOSIS — Z79811 Long term (current) use of aromatase inhibitors: Secondary | ICD-10-CM | POA: Diagnosis not present

## 2019-09-28 DIAGNOSIS — M25559 Pain in unspecified hip: Secondary | ICD-10-CM | POA: Insufficient documentation

## 2019-09-28 DIAGNOSIS — G8929 Other chronic pain: Secondary | ICD-10-CM | POA: Insufficient documentation

## 2019-09-28 DIAGNOSIS — C50812 Malignant neoplasm of overlapping sites of left female breast: Secondary | ICD-10-CM | POA: Insufficient documentation

## 2019-09-28 DIAGNOSIS — R5381 Other malaise: Secondary | ICD-10-CM | POA: Insufficient documentation

## 2019-09-28 DIAGNOSIS — Z95828 Presence of other vascular implants and grafts: Secondary | ICD-10-CM

## 2019-09-28 LAB — COMPREHENSIVE METABOLIC PANEL
ALT: 17 U/L (ref 0–44)
AST: 26 U/L (ref 15–41)
Albumin: 4.1 g/dL (ref 3.5–5.0)
Alkaline Phosphatase: 64 U/L (ref 38–126)
Anion gap: 10 (ref 5–15)
BUN: 15 mg/dL (ref 8–23)
CO2: 26 mmol/L (ref 22–32)
Calcium: 8.9 mg/dL (ref 8.9–10.3)
Chloride: 101 mmol/L (ref 98–111)
Creatinine, Ser: 0.83 mg/dL (ref 0.44–1.00)
GFR calc Af Amer: >60 mL/min (ref >60)
GFR calc non Af Amer: >60 mL/min (ref >60)
Glucose, Bld: 176 mg/dL — ABNORMAL HIGH (ref 70–99)
Potassium: 3.5 mmol/L (ref 3.5–5.1)
Sodium: 137 mmol/L (ref 135–145)
Total Bilirubin: 0.7 mg/dL (ref 0.3–1.2)
Total Protein: 7.1 g/dL (ref 6.5–8.1)

## 2019-09-28 LAB — CBC WITH DIFFERENTIAL/PLATELET
Abs Immature Granulocytes: 0.02 10*3/uL (ref 0.00–0.07)
Basophils Absolute: 0 10*3/uL (ref 0.0–0.1)
Basophils Relative: 1 %
Eosinophils Absolute: 0.2 10*3/uL (ref 0.0–0.5)
Eosinophils Relative: 3 %
HCT: 44.7 % (ref 36.0–46.0)
Hemoglobin: 14.2 g/dL (ref 12.0–15.0)
Immature Granulocytes: 0 %
Lymphocytes Relative: 20 %
Lymphs Abs: 1 10*3/uL (ref 0.7–4.0)
MCH: 29.9 pg (ref 26.0–34.0)
MCHC: 31.8 g/dL (ref 30.0–36.0)
MCV: 94.1 fL (ref 80.0–100.0)
Monocytes Absolute: 0.3 10*3/uL (ref 0.1–1.0)
Monocytes Relative: 6 %
Neutro Abs: 3.7 10*3/uL (ref 1.7–7.7)
Neutrophils Relative %: 70 %
Platelets: 283 10*3/uL (ref 150–400)
RBC: 4.75 MIL/uL (ref 3.87–5.11)
RDW: 13.1 % (ref 11.5–15.5)
WBC: 5.3 10*3/uL (ref 4.0–10.5)
nRBC: 0 % (ref 0.0–0.2)

## 2019-09-28 LAB — BRAIN NATRIURETIC PEPTIDE: B Natriuretic Peptide: 10 pg/mL (ref 0.0–100.0)

## 2019-09-28 MED ORDER — ACETAMINOPHEN 325 MG PO TABS
650.0000 mg | ORAL_TABLET | Freq: Once | ORAL | Status: AC
  Administered 2019-09-28: 650 mg via ORAL
  Filled 2019-09-28: qty 2

## 2019-09-28 MED ORDER — SODIUM CHLORIDE 0.9% FLUSH
10.0000 mL | Freq: Once | INTRAVENOUS | Status: DC
  Filled 2019-09-28: qty 10

## 2019-09-28 MED ORDER — HEPARIN SOD (PORK) LOCK FLUSH 100 UNIT/ML IV SOLN
500.0000 [IU] | Freq: Once | INTRAVENOUS | Status: AC
  Administered 2019-09-28: 500 [IU] via INTRAVENOUS
  Filled 2019-09-28: qty 5

## 2019-09-28 MED ORDER — SODIUM CHLORIDE 0.9% FLUSH
10.0000 mL | Freq: Once | INTRAVENOUS | Status: AC
  Administered 2019-09-28: 10 mL via INTRAVENOUS
  Filled 2019-09-28: qty 10

## 2019-09-28 MED ORDER — HEPARIN SOD (PORK) LOCK FLUSH 100 UNIT/ML IV SOLN
500.0000 [IU] | Freq: Once | INTRAVENOUS | Status: DC
  Filled 2019-09-28: qty 5

## 2019-09-28 MED ORDER — SODIUM CHLORIDE 0.9 % IV SOLN
420.0000 mg | Freq: Once | INTRAVENOUS | Status: AC
  Administered 2019-09-28: 420 mg via INTRAVENOUS
  Filled 2019-09-28: qty 14

## 2019-09-28 MED ORDER — SODIUM CHLORIDE 0.9 % IV SOLN
Freq: Once | INTRAVENOUS | Status: AC
  Filled 2019-09-28: qty 250

## 2019-09-28 MED ORDER — HEPARIN SOD (PORK) LOCK FLUSH 100 UNIT/ML IV SOLN
INTRAVENOUS | Status: AC
  Filled 2019-09-28: qty 5

## 2019-09-28 MED ORDER — TRASTUZUMAB-DKST CHEMO 150 MG IV SOLR
450.0000 mg | Freq: Once | INTRAVENOUS | Status: AC
  Administered 2019-09-28: 10:00:00 450 mg via INTRAVENOUS
  Filled 2019-09-28: qty 21.43

## 2019-09-28 MED ORDER — DIPHENHYDRAMINE HCL 50 MG/ML IJ SOLN
12.5000 mg | Freq: Once | INTRAMUSCULAR | Status: AC
  Administered 2019-09-28: 12.5 mg via INTRAVENOUS
  Filled 2019-09-28: qty 1

## 2019-09-28 NOTE — Progress Notes (Signed)
[place] OFFICE PROGRESS NOTE  Patient Care Team: [name], [name], MD (General Surgery) Requested, [name]   Oncology History Overview Note  # June 2016- LEFT BREAST CA;  [name] ; [name] ; [name]; Her 2 Neu POS; [place] from July OF 2016; [name]; adjuvant Herceptin [ Finished July 2017]; AUG 2017- Neratinib x5 days; DISCON sec to diarrhea  # MID OCT 2018- Right breast mass-Bx- ER/PR-NEG; Her 2 NEU POSITIVE 1~2.5cm;  [?NEW primary]  # MID-OCT 2018-METASTATIC RECURRENT-oh sternal mass; Left Ax LN [Bx]/periportal LN  # OCT 25th 2018- TAXOL-HERCEPTIN-PERJETA; Jan 2019- CT PR; continue HP only; on HOLD since NOV 2019--sec to drop in EF/Sieziues  # July 15th 2020- RE-STARTED HP   -------------------------------------------------------------------  # MUGA scan- July 28th 2017- 67%.  December 2019-EF 53%; January 2020 EF-52%  # chronic gait/balance issues  # Seizures/petit-mal/ Dr.Shah-Keppra [name] [id number]]  # June 2017- left breast Bx- fat necrosis [[name]  # july 2017-  BRCA 1& 2- NEG.   MOLECULAR TESTING- F ONE- TPS- 0%;  ERB2 amplification; PI3K/RET amplification Others**  # PALLIATIVE CARE: P  --------------------------------------------------    DIAGNOSIS: [ OCT 6834]- REC/MET- BREAST CA ER/PR-NEG; her 2 POS  STAGE: 4  ;GOALS: Palliative  CURRENT/MOST RECENT THERAPY- Herceptin-Perjeta [C]    Carcinoma of overlapping sites of left breast in female, estrogen receptor negative ([place])     INTERVAL HISTORY: Patient a poor historian given dementia.  Patient accompanied by husband.  DIANIA CO 79 y.o.  female pleasant patient above history of metastatic breast cancer on Herceptin plus Perjeta-is here for follow-up.  Worsening cognitive function as per the  husband.  Otherwise no falls.  Denies any headaches.   No swelling in the legs.  No worsening shortness of breath or cough.   Review of Systems  Constitutional: Positive for malaise/fatigue. Negative for chills, diaphoresis, fever and weight loss.  HENT: Negative for nosebleeds and sore throat.   Eyes: Negative for double vision.  Respiratory: Negative for cough, hemoptysis, sputum production, shortness of breath and wheezing.   Cardiovascular: Negative for chest pain, palpitations, orthopnea and leg swelling.  Gastrointestinal: Negative for abdominal pain, blood in stool, constipation, diarrhea, heartburn, melena, nausea and vomiting.  Genitourinary: Negative for dysuria, frequency and urgency.  Musculoskeletal: Positive for back pain. Negative for joint pain.  Skin: Negative.  Negative for itching and rash.  Neurological: Negative for dizziness, tingling, focal weakness, weakness and headaches.  Endo/Heme/Allergies: Does not bruise/bleed easily.  Psychiatric/Behavioral: Positive for memory loss. Negative for depression. The patient does not have insomnia.         PAST MEDICAL HISTORY :  Past Medical History:  Diagnosis Date  . Arthritis   . Brain tumor ([place]) 1995   meningeoma  . Breast cancer ([place]) 2018   left breast; surgery 01/11/15 with Dr. Bary Castilla; path with invasive mammary and DCIS  . Breast cancer of upper-inner quadrant of left female breast ([place]) 12/15/14   Completed radiation end of December and finished chemotherapy 2 weeks ago, Left breast invasive mammary carcinoma, T1cN8mc (1.5 cm); Grade 3, [name], [name], [name], .  .Marland KitchenCataract    bilat   . DDD (degenerative disc disease), lumbar    Lumbar, previously evaluated by Dr. HEarnestine Leys . D[place] ([place]   .  Fibrocystic breast disease    prior biopsy  . Glaucoma   . 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 Hearing loss   . History of cancer chemotherapy   . History  of radiation therapy   . Hyperlipidemia, unspecified   . Imbalance   . Memory impairment    seen by Dr Manuella Ghazi  possible post crainiotomy from radiation  . Meningioma ([place])    Left cavernous sinus meningioma, treated with resection and [facility] therapy at [facility], 1995.  . Numbness and tingling    right hand   . Osteoarthritis   . Osteoporosis, post-menopausal   . Personal history of chemotherapy   . Personal history of radiation therapy   . Shoulder pain, right   . Wears glasses     PAST SURGICAL HISTORY :   Past Surgical History:  Procedure Laterality Date  . APPENDECTOMY  1950  . BRAIN SURGERY  1995   left frontal/temporal  . BREAST BIOPSY Left 12/15/14   confirmed DCIS  . BREAST BIOPSY Left 01/08/2015   Procedure: BREAST BIOPSY WITH NEEDLE LOCALIZATION;  Surgeon: [name], MD;  Location: [facility] ORS;  Service: General;  Laterality: Left;  . BREAST EXCISIONAL BIOPSY Left 1997  . BREAST LUMPECTOMY Left 01/08/2015   Procedure: LUMPECTOMY;  Surgeon: [name], MD;  Location: [facility] ORS;  Service: General;  Laterality: Left;  . COLONOSCOPY  2010   Dr. [name]  . ORIF ANKLE FRACTURE Right 08/05/2017   Procedure: OPEN REDUCTION INTERNAL FIXATION (ORIF) ANKLE FRACTURE;  Surgeon: Earnestine Leys, MD;  Location: [facility] ORS;  Service: Orthopedics;  Laterality: Right;  . PORTACATH PLACEMENT Right 01/16/2015   Procedure: INSERTION PORT-A-CATH;  Surgeon: [name], MD;  Location: [facility] ORS;  Service: General;  Laterality: Right;  . SENTINEL NODE BIOPSY Left 01/16/2015   Procedure: SENTINEL NODE BIOPSY;  Surgeon: [name], MD;  Location: [facility] ORS;  Service: General;  Laterality: Left;  . TOTAL HIP ARTHROPLASTY Right 09/04/2015   Procedure: RIGHT TOTAL HIP ARTHROPLASTY ANTERIOR APPROACH;  Surgeon: [name], MD;  Location: [facility];  Service: Orthopedics;  Laterality: Right;    FAMILY HISTORY :   Family History  Problem Relation Age of Onset  . Breast cancer Sister 90  .  Addison's disease Mother   . Hyperthyroidism Mother   . Osteoarthritis Mother   . Stroke Father   . Heart disease Father   . High blood pressure Father     SOCIAL HISTORY:   Social History   Tobacco Use  . Smoking status: Former Smoker    Packs/day: 0.25    Years: 5.00    Pack years: 1.25    Types: Cigarettes    Quit date: 07/28/1972    Years since quitting: 47.2  . Smokeless tobacco: Never Used  Substance Use Topics  . Alcohol use: Yes    Alcohol/week: 1.0 - 2.0 standard drinks    Types: 1 - 2 Glasses of wine per week    Comment: 1 Glass Wine / Night  . Drug use: No    ALLERGIES:  is allergic to donepezil.  MEDICATIONS:  Current Outpatient Medications  Medication Sig Dispense Refill  . acetaminophen (TYLENOL) 325 MG tablet Take 650 mg by mouth every 4 (four) hours as needed. Pain / increased temp.    . bimatoprost (LUMIGAN) 0.01 % SOLN Lumigan 0.01 % eye drops  INT 1 GTT IN OS HS    . Calcium Carbonate-Vitamin D (CALTRATE  600+D PO) Take 1 tablet by mouth daily.    . celecoxib (CELEBREX) 200 MG capsule Take 200 mg by mouth daily.     . Cetirizine HCl (ZYRTEC ALLERGY PO) Take 1 tablet by mouth as needed (allergies).    . hydrocortisone 2.5 % cream APPLY  AA BID FOR UP TO 2 WEEKS EACH MONTH PRN    . levETIRAcetam (KEPPRA) 250 MG tablet 1 tablet daily at bedtime  5  . traMADol (ULTRAM) 50 MG tablet Take 1 tablet (50 mg total) by mouth 2 (two) times daily as needed for moderate pain. 60 tablet 0  . vitamin E 400 UNIT capsule Take 400 Units by mouth daily.      No current facility-administered medications for this visit.   Facility-Administered Medications Ordered in Other Visits  Medication Dose Route Frequency Provider Last Rate Last Admin  . sodium chloride flush (NS) 0.9 % injection 10 mL  10 mL Intravenous PRN Cammie Sickle, MD   10 mL at 07/07/18 0830    PHYSICAL EXAMINATION: ECOG PERFORMANCE STATUS: 1 - Symptomatic but completely ambulatory  BP 110/60    Pulse 73   Temp (!) 96.8 F (36 C) (Tympanic)   Resp 20   Ht 5' 5"  (1.651 m)   Wt 164 lb (74.4 kg)   BMI 27.29 kg/m   Filed Weights   09/28/19 0832  Weight: 164 lb (74.4 kg)     Physical Activity:   . Days of Exercise per Week: Not on file  . Minutes of Exercise per Session: Not on file   .physical   LABORATORY DATA:  I have reviewed the data as listed    Component Value Date/Time   NA 137 09/28/2019 0816   K 3.5 09/28/2019 0816   CL 101 09/28/2019 0816   CO2 26 09/28/2019 0816   GLUCOSE 176 (H) 09/28/2019 0816   BUN 15 09/28/2019 0816   CREATININE 0.83 09/28/2019 0816   CALCIUM 8.9 09/28/2019 0816   PROT 7.1 09/28/2019 0816   ALBUMIN 4.1 09/28/2019 0816   AST 26 09/28/2019 0816   ALT 17 09/28/2019 0816   ALKPHOS 64 09/28/2019 0816   BILITOT 0.7 09/28/2019 0816   GFRNONAA >60 09/28/2019 0816   GFRAA >60 09/28/2019 0816    No results found for: SPEP, UPEP  Lab Results  Component Value Date   WBC 5.3 09/28/2019   NEUTROABS 3.7 09/28/2019   HGB 14.2 09/28/2019   HCT 44.7 09/28/2019   MCV 94.1 09/28/2019   PLT 283 09/28/2019      Chemistry      Component Value Date/Time   NA 137 09/28/2019 0816   K 3.5 09/28/2019 0816   CL 101 09/28/2019 0816   CO2 26 09/28/2019 0816   BUN 15 09/28/2019 0816   CREATININE 0.83 09/28/2019 0816      Component Value Date/Time   CALCIUM 8.9 09/28/2019 0816   ALKPHOS 64 09/28/2019 0816   AST 26 09/28/2019 0816   ALT 17 09/28/2019 0816   BILITOT 0.7 09/28/2019 0816       Physical Exam  Constitutional: She is oriented to person, place, and time and well-developed, well-nourished, and in no distress.  Patient is accompanied by husband.  In a wheelchair.  HENT:  Head: Normocephalic and atraumatic.  Mouth/Throat: Oropharynx is clear and moist. No oropharyngeal exudate.  Eyes: Pupils are equal, round, and reactive to light.  Cardiovascular: Normal rate and regular rhythm.  Pulmonary/Chest: Effort normal and breath  sounds normal. No  respiratory distress. She has no wheezes.  Abdominal: Soft. Bowel sounds are normal. She exhibits no distension and no mass. There is no abdominal tenderness. There is no rebound and no guarding.  Musculoskeletal:        General: No tenderness or edema. Normal range of motion.     Cervical back: Normal range of motion and neck supple.  Neurological: She is alert and oriented to person, place, and time.  Skin: Skin is warm.  Psychiatric: Affect normal.    LABORATORY DATA:  I have reviewed the data as listed    Component Value Date/Time   NA 137 09/28/2019 0816   K 3.5 09/28/2019 0816   CL 101 09/28/2019 0816   CO2 26 09/28/2019 0816   GLUCOSE 176 (H) 09/28/2019 0816   BUN 15 09/28/2019 0816   CREATININE 0.83 09/28/2019 0816   CALCIUM 8.9 09/28/2019 0816   PROT 7.1 09/28/2019 0816   ALBUMIN 4.1 09/28/2019 0816   AST 26 09/28/2019 0816   ALT 17 09/28/2019 0816   ALKPHOS 64 09/28/2019 0816   BILITOT 0.7 09/28/2019 0816   GFRNONAA >60 09/28/2019 0816   GFRAA >60 09/28/2019 0816    No results found for: SPEP, UPEP  Lab Results  Component Value Date   WBC 5.3 09/28/2019   NEUTROABS 3.7 09/28/2019   HGB 14.2 09/28/2019   HCT 44.7 09/28/2019   MCV 94.1 09/28/2019   PLT 283 09/28/2019      Chemistry      Component Value Date/Time   NA 137 09/28/2019 0816   K 3.5 09/28/2019 0816   CL 101 09/28/2019 0816   CO2 26 09/28/2019 0816   BUN 15 09/28/2019 0816   CREATININE 0.83 09/28/2019 0816      Component Value Date/Time   CALCIUM 8.9 09/28/2019 0816   ALKPHOS 64 09/28/2019 0816   AST 26 09/28/2019 0816   ALT 17 09/28/2019 0816   BILITOT 0.7 09/28/2019 0816       RADIOGRAPHIC STUDIES: I have personally reviewed the radiological images as listed and agreed with the findings in the report. No results found.   ASSESSMENT & PLAN:  Carcinoma of overlapping sites of left breast in female, estrogen receptor negative ([place]) #Metastatic breast cancer ER  [name] HER-2/neu positive.  FEB 4th 2021-CT scan chest and pelvis NED- except slightly larger RIGHT AXILLARY LN- 8 mm [from previous 62m]. Otherwise-stable sclerosis of the left manubrium.  Clinically stable.  Discussed with Dr. BTollie Pizza  If worsening would recommend core biopsy/HER-2 evaluation  # Proceed with Herceptin Perjeta.  Labs today reviewed; acceptable for treatment today.   # Drop in ejection fraction-FEB 2021- 51%-stable  # Chronic back and hip pain/ gait instability- STABLE; on asper patch/ tramadol prn.  #Debility/gait instability-limited mobility-this is overall clinically stable.  DISPOSITION:  #Herceptin-Perjeta today # Follow up in 3 weeks-MD;labs-cbc/cmp;  Herceptin-Perjeta; Dr.B  Cc; [name].      Orders Placed This Encounter  Procedures  . CBC with Differential    Standing Status:   Standing    Number of Occurrences:   20    Standing Expiration Date:   09/27/2020  . Comprehensive metabolic panel    Standing Status:   Standing    Number of Occurrences:   20    Standing Expiration Date:   09/27/2020   All questions were answered. The patient knows to call the clinic with any problems, questions or concerns.      GCammie Sickle MD 10/03/2019 12:45 PM

## 2019-09-28 NOTE — Assessment & Plan Note (Addendum)
#  Metastatic breast cancer ER [name] HER-2/neu positive.  FEB 4th 2021-CT scan chest and pelvis NED- except slightly larger RIGHT AXILLARY LN- 8 mm [from previous 65m]. Otherwise-stable sclerosis of the left manubrium.  Clinically stable.  Discussed with Dr. BTollie Pizza  If worsening would recommend core biopsy/HER-2 evaluation  # Proceed with Herceptin Perjeta.  Labs today reviewed; acceptable for treatment today.   # Drop in ejection fraction-FEB 2021- 51%-stable  # Chronic back and hip pain/ gait instability- STABLE; on asper patch/ tramadol prn.  #Debility/gait instability-limited mobility-this is overall clinically stable.  DISPOSITION:  #Herceptin-Perjeta today # Follow up in 3 weeks-MD;labs-cbc/cmp;  Herceptin-Perjeta; Dr.B  Cc; [name].

## 2019-10-18 ENCOUNTER — Other Ambulatory Visit: Payer: Self-pay

## 2019-10-18 DIAGNOSIS — E785 Hyperlipidemia, unspecified: Secondary | ICD-10-CM | POA: Insufficient documentation

## 2019-10-18 DIAGNOSIS — M25619 Stiffness of unspecified shoulder, not elsewhere classified: Secondary | ICD-10-CM | POA: Insufficient documentation

## 2019-10-18 DIAGNOSIS — M25519 Pain in unspecified shoulder: Secondary | ICD-10-CM | POA: Insufficient documentation

## 2019-10-18 DIAGNOSIS — M48061 Spinal stenosis, lumbar region without neurogenic claudication: Secondary | ICD-10-CM | POA: Insufficient documentation

## 2019-10-18 DIAGNOSIS — M1610 Unilateral primary osteoarthritis, unspecified hip: Secondary | ICD-10-CM | POA: Insufficient documentation

## 2019-10-18 DIAGNOSIS — S43016A Anterior dislocation of unspecified humerus, initial encounter: Secondary | ICD-10-CM | POA: Insufficient documentation

## 2019-10-18 NOTE — Progress Notes (Signed)
Patient pre screened for office appointment, no questions or concerns today. Patient reminded of upcoming appointment time and date. 

## 2019-10-19 ENCOUNTER — Telehealth: Payer: Self-pay | Admitting: *Deleted

## 2019-10-19 ENCOUNTER — Encounter: Payer: Self-pay | Admitting: *Deleted

## 2019-10-19 ENCOUNTER — Inpatient Hospital Stay: Payer: Medicare Other

## 2019-10-19 ENCOUNTER — Inpatient Hospital Stay (HOSPITAL_BASED_OUTPATIENT_CLINIC_OR_DEPARTMENT_OTHER): Payer: Medicare Other | Admitting: Internal Medicine

## 2019-10-19 VITALS — BP 123/61 | HR 62 | Temp 96.9°F | Resp 18

## 2019-10-19 DIAGNOSIS — C50812 Malignant neoplasm of overlapping sites of left female breast: Secondary | ICD-10-CM | POA: Diagnosis not present

## 2019-10-19 DIAGNOSIS — Z171 Estrogen receptor negative status [ER-]: Secondary | ICD-10-CM

## 2019-10-19 DIAGNOSIS — Z5112 Encounter for antineoplastic immunotherapy: Secondary | ICD-10-CM | POA: Diagnosis not present

## 2019-10-19 LAB — COMPREHENSIVE METABOLIC PANEL
ALT: 17 U/L (ref 0–44)
AST: 26 U/L (ref 15–41)
Albumin: 4.2 g/dL (ref 3.5–5.0)
Alkaline Phosphatase: 65 U/L (ref 38–126)
Anion gap: 12 (ref 5–15)
BUN: 14 mg/dL (ref 8–23)
CO2: 25 mmol/L (ref 22–32)
Calcium: 8.7 mg/dL — ABNORMAL LOW (ref 8.9–10.3)
Chloride: 100 mmol/L (ref 98–111)
Creatinine, Ser: 0.87 mg/dL (ref 0.44–1.00)
GFR calc Af Amer: >60 mL/min (ref >60)
GFR calc non Af Amer: >60 mL/min (ref >60)
Glucose, Bld: 192 mg/dL — ABNORMAL HIGH (ref 70–99)
Potassium: 3.5 mmol/L (ref 3.5–5.1)
Sodium: 137 mmol/L (ref 135–145)
Total Bilirubin: 0.6 mg/dL (ref 0.3–1.2)
Total Protein: 6.9 g/dL (ref 6.5–8.1)

## 2019-10-19 LAB — CBC WITH DIFFERENTIAL/PLATELET
Abs Immature Granulocytes: 0.02 10*3/uL (ref 0.00–0.07)
Basophils Absolute: 0 10*3/uL (ref 0.0–0.1)
Basophils Relative: 0 %
Eosinophils Absolute: 0.1 10*3/uL (ref 0.0–0.5)
Eosinophils Relative: 2 %
HCT: 43.5 % (ref 36.0–46.0)
Hemoglobin: 14.2 g/dL (ref 12.0–15.0)
Immature Granulocytes: 0 %
Lymphocytes Relative: 18 %
Lymphs Abs: 1.1 10*3/uL (ref 0.7–4.0)
MCH: 30.1 pg (ref 26.0–34.0)
MCHC: 32.6 g/dL (ref 30.0–36.0)
MCV: 92.2 fL (ref 80.0–100.0)
Monocytes Absolute: 0.3 10*3/uL (ref 0.1–1.0)
Monocytes Relative: 5 %
Neutro Abs: 4.5 10*3/uL (ref 1.7–7.7)
Neutrophils Relative %: 75 %
Platelets: 300 10*3/uL (ref 150–400)
RBC: 4.72 MIL/uL (ref 3.87–5.11)
RDW: 12.9 % (ref 11.5–15.5)
WBC: 6 10*3/uL (ref 4.0–10.5)
nRBC: 0 % (ref 0.0–0.2)

## 2019-10-19 MED ORDER — DIPHENHYDRAMINE HCL 50 MG/ML IJ SOLN
12.5000 mg | Freq: Once | INTRAMUSCULAR | Status: AC
  Administered 2019-10-19: 12.5 mg via INTRAVENOUS
  Filled 2019-10-19: qty 1

## 2019-10-19 MED ORDER — SODIUM CHLORIDE 0.9% FLUSH
10.0000 mL | Freq: Once | INTRAVENOUS | Status: AC
  Administered 2019-10-19: 10 mL via INTRAVENOUS
  Filled 2019-10-19: qty 10

## 2019-10-19 MED ORDER — HEPARIN SOD (PORK) LOCK FLUSH 100 UNIT/ML IV SOLN
500.0000 [IU] | Freq: Once | INTRAVENOUS | Status: DC | PRN
  Filled 2019-10-19: qty 5

## 2019-10-19 MED ORDER — HEPARIN SOD (PORK) LOCK FLUSH 100 UNIT/ML IV SOLN
500.0000 [IU] | Freq: Once | INTRAVENOUS | Status: AC
  Administered 2019-10-19: 12:00:00 500 [IU] via INTRAVENOUS
  Filled 2019-10-19: qty 5

## 2019-10-19 MED ORDER — SODIUM CHLORIDE 0.9 % IV SOLN
420.0000 mg | Freq: Once | INTRAVENOUS | Status: AC
  Administered 2019-10-19: 420 mg via INTRAVENOUS
  Filled 2019-10-19: qty 14

## 2019-10-19 MED ORDER — SODIUM CHLORIDE 0.9 % IV SOLN
Freq: Once | INTRAVENOUS | Status: AC
  Filled 2019-10-19: qty 250

## 2019-10-19 MED ORDER — TRASTUZUMAB-DKST CHEMO 150 MG IV SOLR
450.0000 mg | Freq: Once | INTRAVENOUS | Status: AC
  Administered 2019-10-19: 450 mg via INTRAVENOUS
  Filled 2019-10-19: qty 21.43

## 2019-10-19 MED ORDER — ACETAMINOPHEN 325 MG PO TABS
650.0000 mg | ORAL_TABLET | Freq: Once | ORAL | Status: AC
  Administered 2019-10-19: 650 mg via ORAL
  Filled 2019-10-19: qty 2

## 2019-10-19 NOTE — Progress Notes (Signed)
Pt tolerated infusion well. Pt and VS stable at discharge.  

## 2019-10-19 NOTE — Progress Notes (Signed)
[place] OFFICE PROGRESS NOTE  Patient Care Team: Michele Hector, MD as PCP - General (Internal Medicine) Michele Castilla [name], MD (General Surgery) Requested, [name]   Oncology History Overview Note  # June 2016- LEFT BREAST CA;  [name] ; MicheleByrnett] ; [name]; Her 2 Neu POS; [place] from July OF 2016; [name]; adjuvant Herceptin [ Finished July 2017]; AUG 2017- Neratinib x5 days; DISCON sec to diarrhea  # MID OCT 2018- Right breast mass-Bx- ER/PR-NEG; Her 2 NEU POSITIVE 1~2.5cm;  [?NEW primary]  # MID-OCT 2018-METASTATIC RECURRENT-oh sternal mass; Left Ax LN [Bx]/periportal LN  # OCT 25th 2018- TAXOL-HERCEPTIN-PERJETA; Jan 2019- CT PR; continue HP only; on HOLD since NOV 2019--sec to drop in EF/[name]  # July 15th 2020- RE-STARTED HP   -------------------------------------------------------------------  # MUGA scan- July 28th 2017- 67%.  December 2019-EF 53%; January 2020 EF-52%  # chronic gait/balance issues  # Seizures/petit-mal/ [name]]  # June 2017- left breast Bx- fat necrosis [MicheleByrnett]  # july 2017-  BRCA 1& 2- NEG.   MOLECULAR TESTING- F ONE- TPS- 0%;  ERB2 amplification; PI3K/RET amplification Others**  # PALLIATIVE CARE: P  --------------------------------------------------    DIAGNOSIS: [ OCT 8546]- REC/MET- BREAST CA ER/PR-NEG; her 2 POS  STAGE: 4  ;GOALS: Palliative  CURRENT/MOST RECENT THERAPY- Herceptin-Perjeta [C]    Carcinoma of overlapping sites of left breast in female, estrogen receptor negative ([place])     INTERVAL HISTORY: Patient a poor historian given dementia.  Patient accompanied by husband.  [name] 79 y.o.  female pleasant patient above history of metastatic breast cancer on Herceptin plus Perjeta-is here for follow-up.  Patient husband overall feels her  dementia/cognitive function declining.  No headaches.  No falls.  However husband feels given her dementia/back pain/limited mobility is very concerned about high risk of falls.    Review of Systems  Unable to perform ROS: Dementia      PAST MEDICAL HISTORY :  Past Medical History:  Diagnosis Date  . Arthritis   . 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 Breast cancer ([place]) 2018   left breast; surgery 01/11/15 with Dr. Bary Castilla; path with invasive mammary and DCIS  . Breast cancer of upper-inner quadrant of left female breast ([place]) 12/15/14   Completed radiation end of December and finished chemotherapy 2 weeks ago, Left breast invasive mammary carcinoma, T1cN77mc (1.5 cm); Grade 3, [name], [name], [name], .  .Marland KitchenCataract    bilat   . DDD (degenerative disc disease), lumbar    Lumbar, previously evaluated by [name] . [place]   . Fibrocystic breast disease    prior biopsy  . Glaucoma   . 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 Hearing loss   . History of cancer chemotherapy   . History of radiation therapy   . Hyperlipidemia, unspecified   . Imbalance   . Memory impairment    seen by Dr SManuella [name] possible post crainiotomy from radiation  . [place]    Left cavernous sinus meningioma, [facility]  . Numbness and tingling    right hand   . Osteoarthritis   . Osteoporosis, post-menopausal   . Personal history of chemotherapy   . Personal  history of radiation therapy   . Shoulder pain, right   . Wears glasses     PAST SURGICAL HISTORY :   Past Surgical History:  Procedure Laterality Date  . APPENDECTOMY  1950  . BRAIN SURGERY  1995   left frontal/temporal  . BREAST BIOPSY Left 12/15/14   confirmed DCIS  . BREAST BIOPSY Left 01/08/2015   Procedure: BREAST BIOPSY WITH NEEDLE LOCALIZATION;  Surgeon: Michele Bellow, MD;  Location: [facility] ORS;  Service:  General;  Laterality: Left;  . BREAST EXCISIONAL BIOPSY Left 1997  . BREAST LUMPECTOMY Left 01/08/2015   Procedure: LUMPECTOMY;  Surgeon: Michele Bellow, MD;  Location: [facility] ORS;  Service: General;  Laterality: Left;  . COLONOSCOPY  2010   Dr. Tiffany [name]  . ORIF ANKLE FRACTURE Right 08/05/2017   Procedure: OPEN REDUCTION INTERNAL FIXATION (ORIF) ANKLE FRACTURE;  Surgeon: Michele Leys, MD;  Location: [facility] ORS;  Service: Orthopedics;  Laterality: Right;  . PORTACATH PLACEMENT Right 01/16/2015   Procedure: INSERTION PORT-A-CATH;  Surgeon: Michele Bellow, MD;  Location: [facility] ORS;  Service: General;  Laterality: Right;  . SENTINEL NODE BIOPSY Left 01/16/2015   Procedure: SENTINEL NODE BIOPSY;  Surgeon: Michele Bellow, MD;  Location: [facility] ORS;  Service: General;  Laterality: Left;  . TOTAL HIP ARTHROPLASTY Right 09/04/2015   Procedure: RIGHT TOTAL HIP ARTHROPLASTY ANTERIOR APPROACH;  Surgeon: Michele Cancel, MD;  Location: [facility];  Service: Orthopedics;  Laterality: Right;    FAMILY HISTORY :   Family History  Problem Relation Age of Onset  . Breast cancer Sister 68  . Addison's disease Mother   . Hyperthyroidism Mother   . Osteoarthritis Mother   . Stroke Father   . Heart disease Father   . High blood pressure Father     SOCIAL HISTORY:   Social History   Tobacco Use  . Smoking status: Former Smoker    Packs/day: 0.25    Years: 5.00    Pack years: 1.25    Types: Cigarettes    Quit date: 07/28/1972    Years since quitting: 47.2  . Smokeless tobacco: Never Used  Substance Use Topics  . Alcohol use: Yes    Alcohol/week: 1.0 - 2.0 standard drinks    Types: 1 - 2 Glasses of wine per week    Comment: 1 Glass Wine / Night  . Drug use: No    ALLERGIES:  is allergic to donepezil.  MEDICATIONS:  Current Outpatient Medications  Medication Sig Dispense Refill  . acetaminophen (TYLENOL) 325 MG tablet Take 650 mg by mouth every 4 (four) hours as needed. Pain / increased temp.    .  ARTIFICIAL TEARS 1 % ophthalmic solution Place 1 drop into both eyes daily.    Marland Kitchen ascorbic acid (VITAMIN C) 500 MG tablet Take 1 tablet by mouth daily.    . Calcium Carbonate-Vitamin D (CALTRATE 600+D PO) Take 1 tablet by mouth daily.    . celecoxib (CELEBREX) 200 MG capsule Take 200 mg by mouth daily.     . Cetirizine HCl (ZYRTEC ALLERGY PO) Take 1 tablet by mouth as needed (allergies).    . chlorhexidine (PERIDEX) 0.12 % solution 15 mLs 2 (two) times daily.    Marland Kitchen levETIRAcetam (KEPPRA) 250 MG tablet 1 tablet daily at bedtime  5  . traMADol (ULTRAM) 50 MG tablet Take 1 tablet (50 mg total) by mouth 2 (two) times daily as needed for moderate pain. 60 tablet 0  . vitamin E 400 UNIT capsule Take  400 Units by mouth daily.     . bimatoprost (LUMIGAN) 0.01 % SOLN Lumigan 0.01 % eye drops  INT 1 GTT IN OS HS    . hydrocortisone 2.5 % cream APPLY  AA BID FOR UP TO 2 WEEKS EACH MONTH PRN     No current facility-administered medications for this visit.   Facility-Administered Medications Ordered in Other Visits  Medication Dose Route Frequency Provider Last Rate Last Admin  . heparin lock flush 100 unit/mL  500 Units Intracatheter Once PRN Charlaine Dalton R, MD      . sodium chloride flush (NS) 0.9 % injection 10 mL  10 mL Intravenous PRN Cammie Sickle, MD   10 mL at 07/07/18 0830    PHYSICAL EXAMINATION: ECOG PERFORMANCE STATUS: 2 - Symptomatic, <50% confined to bed  BP 118/66 (BP Location: Right Arm, Patient Position: Sitting, Cuff Size: Normal)   Pulse 60   Temp (!) 96 F (35.6 C) (Tympanic)   Resp 20   Wt 164 lb (74.4 kg)   BMI 27.29 kg/m   Filed Weights   10/19/19 0841  Weight: 164 lb (74.4 kg)     Physical Activity:   . Days of Exercise per Week:   . Minutes of Exercise per Session:    .physical   LABORATORY DATA:  I have reviewed the data as listed    Component Value Date/Time   NA 137 10/19/2019 0821   K 3.5 10/19/2019 0821   CL 100 10/19/2019 0821   CO2  25 10/19/2019 0821   GLUCOSE 192 (H) 10/19/2019 0821   BUN 14 10/19/2019 0821   CREATININE 0.87 10/19/2019 0821   CALCIUM 8.7 (L) 10/19/2019 0821   PROT 6.9 10/19/2019 0821   ALBUMIN 4.2 10/19/2019 0821   AST 26 10/19/2019 0821   ALT 17 10/19/2019 0821   ALKPHOS 65 10/19/2019 0821   BILITOT 0.6 10/19/2019 0821   GFRNONAA >60 10/19/2019 0821   GFRAA >60 10/19/2019 0821    No results found for: SPEP, UPEP  Lab Results  Component Value Date   WBC 6.0 10/19/2019   NEUTROABS 4.5 10/19/2019   HGB 14.2 10/19/2019   HCT 43.5 10/19/2019   MCV 92.2 10/19/2019   PLT 300 10/19/2019      Chemistry      Component Value Date/Time   NA 137 10/19/2019 0821   K 3.5 10/19/2019 0821   CL 100 10/19/2019 0821   CO2 25 10/19/2019 0821   BUN 14 10/19/2019 0821   CREATININE 0.87 10/19/2019 0821      Component Value Date/Time   CALCIUM 8.7 (L) 10/19/2019 0821   ALKPHOS 65 10/19/2019 0821   AST 26 10/19/2019 0821   ALT 17 10/19/2019 0821   BILITOT 0.6 10/19/2019 0821       Physical Exam  Constitutional: She is oriented to person, place, and time and well-developed, well-nourished, and in no distress.  Patient is accompanied by husband.  In a wheelchair.  HENT:  Head: Normocephalic and atraumatic.  Mouth/Throat: Oropharynx is clear and moist. No oropharyngeal exudate.  Eyes: Pupils are equal, round, and reactive to light.  Cardiovascular: Normal rate and regular rhythm.  Pulmonary/Chest: Effort normal and breath sounds normal. No respiratory distress. She has no wheezes.  Abdominal: Soft. Bowel sounds are normal. She exhibits no distension and no mass. There is no abdominal tenderness. There is no rebound and no guarding.  Musculoskeletal:        General: No tenderness or edema. Normal range of  motion.     Cervical back: Normal range of motion and neck supple.  Neurological: She is alert and oriented to person, place, and time.  Skin: Skin is warm.  Psychiatric: Affect normal.     LABORATORY DATA:  I have reviewed the data as listed    Component Value Date/Time   NA 137 10/19/2019 0821   K 3.5 10/19/2019 0821   CL 100 10/19/2019 0821   CO2 25 10/19/2019 0821   GLUCOSE 192 (H) 10/19/2019 0821   BUN 14 10/19/2019 0821   CREATININE 0.87 10/19/2019 0821   CALCIUM 8.7 (L) 10/19/2019 0821   PROT 6.9 10/19/2019 0821   ALBUMIN 4.2 10/19/2019 0821   AST 26 10/19/2019 0821   ALT 17 10/19/2019 0821   ALKPHOS 65 10/19/2019 0821   BILITOT 0.6 10/19/2019 0821   GFRNONAA >60 10/19/2019 0821   GFRAA >60 10/19/2019 0821    No results found for: SPEP, UPEP  Lab Results  Component Value Date   WBC 6.0 10/19/2019   NEUTROABS 4.5 10/19/2019   HGB 14.2 10/19/2019   HCT 43.5 10/19/2019   MCV 92.2 10/19/2019   PLT 300 10/19/2019      Chemistry      Component Value Date/Time   NA 137 10/19/2019 0821   K 3.5 10/19/2019 0821   CL 100 10/19/2019 0821   CO2 25 10/19/2019 0821   BUN 14 10/19/2019 0821   CREATININE 0.87 10/19/2019 0821      Component Value Date/Time   CALCIUM 8.7 (L) 10/19/2019 0821   ALKPHOS 65 10/19/2019 0821   AST 26 10/19/2019 0821   ALT 17 10/19/2019 0821   BILITOT 0.6 10/19/2019 0821       RADIOGRAPHIC STUDIES: I have personally reviewed the radiological images as listed and agreed with the findings in the report. No results found.   ASSESSMENT & PLAN:  Carcinoma of overlapping sites of left breast in female, estrogen receptor negative ([place]) #Metastatic breast cancer ER [name] HER-2/neu positive.  FEB 4th 2021-CT scan chest and pelvis NED- except slightly larger RIGHT AXILLARY LN- 8 mm [from previous 80m]. Otherwise-stable sclerosis of the left manubrium.  Stable  # Proceed with Herceptin Perjeta.  Labs today reviewed; acceptable for treatment today.   # Drop in ejection fraction-FEB 2021- 51%- clinically stable.   # Chronic back and hip pain/ gait instability- STABLE;  on asper patch/ tramadol prn.  # Debility/gait  instability/dementia- needs constant attention given the risk of falls. Pt also has limited mobility requiring close attention with any activity.   DISPOSITION:  #Herceptin-Perjeta today # Follow up in 3 weeks-MD;labs-cbc/cmp;  Herceptin-Perjeta; MicheleB    No orders of the defined types were placed in this encounter.  All questions were answered. The patient knows to call the clinic with any problems, questions or concerns.      GCammie Sickle MD 10/19/2019 12:31 PM

## 2019-10-19 NOTE — Assessment & Plan Note (Addendum)
#  Metastatic breast cancer ER [name] HER-2/neu positive.  FEB 4th 2021-CT scan chest and pelvis NED- except slightly larger RIGHT AXILLARY LN- 8 mm [from previous 25m]. Otherwise-stable sclerosis of the left manubrium.  Stable  # Proceed with Herceptin Perjeta.  Labs today reviewed; acceptable for treatment today.   # Drop in ejection fraction-FEB 2021- 51%- clinically stable.   # Chronic back and hip pain/ gait instability- STABLE;  on asper patch/ tramadol prn.  # Debility/gait instability/dementia- needs constant attention given the risk of falls. Pt also has limited mobility requiring close attention with any activity.   DISPOSITION:  #Herceptin-Perjeta today # Follow up in 3 weeks-MD;labs-cbc/cmp;  Herceptin-Perjeta; Dr.B

## 2019-10-19 NOTE — Telephone Encounter (Signed)
Phone note open in error

## 2019-11-02 NOTE — Progress Notes (Signed)
Pharmacist Chemotherapy Monitoring - Follow Up Assessment    I verify that I have reviewed each item in the below checklist:  . Regimen for the patient is scheduled for the appropriate day and plan matches scheduled date. Marland Kitchen Appropriate non-routine labs are ordered dependent on drug ordered. . If applicable, additional medications reviewed and ordered per protocol based on lifetime cumulative doses and/or treatment regimen.   Plan for follow-up and/or issues identified: No . I-vent associated with next due treatment: No . MD and/or nursing notified: No  [name] K 11/02/2019 8:15 AM

## 2019-11-09 ENCOUNTER — Inpatient Hospital Stay (HOSPITAL_BASED_OUTPATIENT_CLINIC_OR_DEPARTMENT_OTHER): Payer: Medicare Other | Admitting: Internal Medicine

## 2019-11-09 ENCOUNTER — Inpatient Hospital Stay: Payer: Medicare Other

## 2019-11-09 ENCOUNTER — Other Ambulatory Visit: Payer: Self-pay

## 2019-11-09 ENCOUNTER — Inpatient Hospital Stay: Payer: Medicare Other | Attending: Internal Medicine

## 2019-11-09 VITALS — BP 118/60 | HR 68 | Temp 96.8°F | Resp 20 | Ht 65.0 in | Wt 165.0 lb

## 2019-11-09 DIAGNOSIS — Z171 Estrogen receptor negative status [ER-]: Secondary | ICD-10-CM

## 2019-11-09 DIAGNOSIS — C50812 Malignant neoplasm of overlapping sites of left female breast: Secondary | ICD-10-CM

## 2019-11-09 DIAGNOSIS — Z5112 Encounter for antineoplastic immunotherapy: Secondary | ICD-10-CM | POA: Diagnosis present

## 2019-11-09 DIAGNOSIS — F0390 Unspecified dementia without behavioral disturbance: Secondary | ICD-10-CM | POA: Insufficient documentation

## 2019-11-09 DIAGNOSIS — R5381 Other malaise: Secondary | ICD-10-CM | POA: Insufficient documentation

## 2019-11-09 DIAGNOSIS — R2689 Other abnormalities of gait and mobility: Secondary | ICD-10-CM | POA: Diagnosis not present

## 2019-11-09 DIAGNOSIS — Z95828 Presence of other vascular implants and grafts: Secondary | ICD-10-CM

## 2019-11-09 LAB — COMPREHENSIVE METABOLIC PANEL
ALT: 19 U/L (ref 0–44)
AST: 24 U/L (ref 15–41)
Albumin: 4.1 g/dL (ref 3.5–5.0)
Alkaline Phosphatase: 64 U/L (ref 38–126)
Anion gap: 11 (ref 5–15)
BUN: 13 mg/dL (ref 8–23)
CO2: 26 mmol/L (ref 22–32)
Calcium: 8.5 mg/dL — ABNORMAL LOW (ref 8.9–10.3)
Chloride: 99 mmol/L (ref 98–111)
Creatinine, Ser: 0.79 mg/dL (ref 0.44–1.00)
GFR calc Af Amer: >60 mL/min (ref >60)
GFR calc non Af Amer: >60 mL/min (ref >60)
Glucose, Bld: 183 mg/dL — ABNORMAL HIGH (ref 70–99)
Potassium: 3.6 mmol/L (ref 3.5–5.1)
Sodium: 136 mmol/L (ref 135–145)
Total Bilirubin: 0.7 mg/dL (ref 0.3–1.2)
Total Protein: 6.9 g/dL (ref 6.5–8.1)

## 2019-11-09 LAB — CBC WITH DIFFERENTIAL/PLATELET
Abs Immature Granulocytes: 0.02 10*3/uL (ref 0.00–0.07)
Basophils Absolute: 0 10*3/uL (ref 0.0–0.1)
Basophils Relative: 1 %
Eosinophils Absolute: 0.2 10*3/uL (ref 0.0–0.5)
Eosinophils Relative: 3 %
HCT: 42.7 % (ref 36.0–46.0)
Hemoglobin: 13.8 g/dL (ref 12.0–15.0)
Immature Granulocytes: 0 %
Lymphocytes Relative: 20 %
Lymphs Abs: 1.1 10*3/uL (ref 0.7–4.0)
MCH: 30.1 pg (ref 26.0–34.0)
MCHC: 32.3 g/dL (ref 30.0–36.0)
MCV: 93 fL (ref 80.0–100.0)
Monocytes Absolute: 0.3 10*3/uL (ref 0.1–1.0)
Monocytes Relative: 6 %
Neutro Abs: 3.7 10*3/uL (ref 1.7–7.7)
Neutrophils Relative %: 70 %
Platelets: 285 10*3/uL (ref 150–400)
RBC: 4.59 MIL/uL (ref 3.87–5.11)
RDW: 13.2 % (ref 11.5–15.5)
WBC: 5.3 10*3/uL (ref 4.0–10.5)
nRBC: 0 % (ref 0.0–0.2)

## 2019-11-09 MED ORDER — HEPARIN SOD (PORK) LOCK FLUSH 100 UNIT/ML IV SOLN
500.0000 [IU] | Freq: Once | INTRAVENOUS | Status: AC | PRN
  Administered 2019-11-09: 500 [IU]
  Filled 2019-11-09: qty 5

## 2019-11-09 MED ORDER — SODIUM CHLORIDE 0.9 % IV SOLN
Freq: Once | INTRAVENOUS | Status: AC
  Filled 2019-11-09: qty 250

## 2019-11-09 MED ORDER — SODIUM CHLORIDE 0.9% FLUSH
10.0000 mL | Freq: Once | INTRAVENOUS | Status: AC
  Administered 2019-11-09: 10 mL via INTRAVENOUS
  Filled 2019-11-09: qty 10

## 2019-11-09 MED ORDER — ACETAMINOPHEN 325 MG PO TABS
650.0000 mg | ORAL_TABLET | Freq: Once | ORAL | Status: AC
  Administered 2019-11-09: 650 mg via ORAL
  Filled 2019-11-09: qty 2

## 2019-11-09 MED ORDER — SODIUM CHLORIDE 0.9 % IV SOLN
420.0000 mg | Freq: Once | INTRAVENOUS | Status: AC
  Administered 2019-11-09: 420 mg via INTRAVENOUS
  Filled 2019-11-09: qty 14

## 2019-11-09 MED ORDER — HEPARIN SOD (PORK) LOCK FLUSH 100 UNIT/ML IV SOLN
INTRAVENOUS | Status: AC
  Filled 2019-11-09: qty 5

## 2019-11-09 MED ORDER — TRASTUZUMAB-DKST CHEMO 150 MG IV SOLR
450.0000 mg | Freq: Once | INTRAVENOUS | Status: AC
  Administered 2019-11-09: 450 mg via INTRAVENOUS
  Filled 2019-11-09: qty 21.43

## 2019-11-09 MED ORDER — DIPHENHYDRAMINE HCL 50 MG/ML IJ SOLN
12.5000 mg | Freq: Once | INTRAMUSCULAR | Status: AC
  Administered 2019-11-09: 12.5 mg via INTRAVENOUS
  Filled 2019-11-09: qty 1

## 2019-11-09 NOTE — Assessment & Plan Note (Addendum)
#  Metastatic breast cancer ER [name] HER-2/neu positive.  FEB 4th 2021-CT scan chest and pelvis NED- except slightly larger RIGHT AXILLARY LN- 8 mm [from previous 78m]. Otherwise-stable sclerosis of the left manubrium.  Clinically stable.  # Proceed with Herceptin Perjeta.  Labs today reviewed; acceptable for treatment today.  We will plan to get imaging prior to next visit.  Scans ordered today.  # Drop in ejection fraction-FEB 2021- 51%- clinically stable.  Will order at next visit.  # Chronic back and hip pain/ gait instability- STABLE;  on asper patch/ tramadol prn.  # Hypocalcemia-slightly worse 8.5; recommend adding calcium along with vitamin D.  # Debility/gait instability/dementia- needs constant attention given the risk of falls.  Stable.  DISPOSITION:  #Herceptin-Perjeta today # Follow up in 3 weeks-MD;labs-cbc/cmp;  Herceptin-Perjeta; CT Chest/A/P prior Dr.B

## 2019-11-09 NOTE — Progress Notes (Signed)
[place] OFFICE PROGRESS NOTE  Patient Care Team: [name], [name], MD (General Surgery) Requested, [name]   Oncology History Overview Note  # June 2016- LEFT BREAST CA;  [name] ; [name] ; [name]; Her 2 Neu POS; [place] from July OF 2016; [name]; adjuvant Herceptin [ Finished July 2017]; AUG 2017- Neratinib x5 days; DISCON sec to diarrhea  # MID OCT 2018- Right breast mass-Bx- ER/PR-NEG; Her 2 NEU POSITIVE 1~2.5cm;  [?NEW primary]  # MID-OCT 2018-METASTATIC RECURRENT-oh sternal mass; Left Ax LN [Bx]/periportal LN  # OCT 25th 2018- TAXOL-HERCEPTIN-PERJETA; Jan 2019- CT PR; continue HP only; on HOLD since NOV 2019--sec to drop in EF/Sieziues  # July 15th 2020- RE-STARTED HP   -------------------------------------------------------------------  # MUGA scan- July 28th 2017- 67%.  December 2019-EF 53%; January 2020 EF-52%  # chronic gait/balance issues  # Seizures/petit-mal/ Dr.Shah-Keppra [name] [id number]]  # June 2017- left breast Bx- fat necrosis [[name]  # july 2017-  BRCA 1& 2- NEG.   MOLECULAR TESTING- F ONE- TPS- 0%;  ERB2 amplification; PI3K/RET amplification Others**  # PALLIATIVE CARE: P  --------------------------------------------------    DIAGNOSIS: [ OCT 1914]- REC/MET- BREAST CA ER/PR-NEG; her 2 POS  STAGE: 4  ;GOALS: Palliative  CURRENT/MOST RECENT THERAPY- Herceptin-Perjeta [C]    Carcinoma of overlapping sites of left breast in female, estrogen receptor negative ([place])     INTERVAL HISTORY: Patient a poor historian given dementia.  Patient accompanied by husband.  [name] 79 y.o.  female pleasant patient above history of metastatic breast cancer on Herceptin plus Perjeta-is here for follow-up.  Patient continues to read avidly.  Patient  currently has home health/physical therapy helping to avoid falls.  No falls.  Chronic back pain hip pain not any worse.  Review of Systems  Unable to perform ROS: Dementia      PAST MEDICAL HISTORY :  Past Medical History:  Diagnosis Date  . Arthritis   . Brain tumor ([place]) 1995   meningeoma  . Breast cancer ([place]) 2018   left breast; surgery 01/11/15 with Dr. Bary Castilla; path with invasive mammary and DCIS  . Breast cancer of upper-inner quadrant of left female breast ([place]) 12/15/14   Completed radiation end of December and finished chemotherapy 2 weeks ago, Left breast invasive mammary carcinoma, T1cN22mc (1.5 cm); Grade 3, [name], [name], [name], .  .Marland KitchenCataract    bilat   . DDD (degenerative disc disease), lumbar    Lumbar, previously evaluated by Dr. HEarnestine Leys . Dementia ([place]   . Fibrocystic breast disease    prior biopsy  . Glaucoma   . 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 Hearing loss   . History of cancer chemotherapy   . History of radiation therapy   . Hyperlipidemia, unspecified   . Imbalance   . Memory impairment    seen by Dr SManuella Ghazi possible post crainiotomy from radiation  . Meningioma ([place]    Left cavernous sinus meningioma, [facility]  . Numbness and tingling    right hand   . Osteoarthritis   . Osteoporosis, post-menopausal   . Personal history of chemotherapy   . Personal history of radiation therapy   .  Shoulder pain, right   . Wears glasses     PAST SURGICAL HISTORY :   Past Surgical History:  Procedure Laterality Date  . APPENDECTOMY  1950  . BRAIN SURGERY  1995   left frontal/temporal  . BREAST BIOPSY Left 12/15/14   confirmed DCIS  . BREAST BIOPSY Left 01/08/2015   Procedure: BREAST BIOPSY WITH NEEDLE LOCALIZATION;  Surgeon: [name], MD;  Location: [facility] ORS;  Service: General;  Laterality: Left;  . BREAST EXCISIONAL BIOPSY Left  1997  . BREAST LUMPECTOMY Left 01/08/2015   Procedure: LUMPECTOMY;  Surgeon: [name], MD;  Location: [facility] OR[facility];  Service: General;  Laterality: Left;  . COLONOSCOPY  2010   Dr. [name]  . ORIF ANKLE FRACTURE Right 08/05/2017   Procedure: OPEN REDUCTION INTERNAL FIXATION (ORIF) ANKLE FRACTURE;  Surgeon: Earnestine Leys, MD;  Location: [facility] ORS;  Service: Orthopedics;  Laterality: Right;  . PORTACATH PLACEMENT Right 01/16/2015   Procedure: INSERTION PORT-A-CATH;  Surgeon: [name], MD;  Location: [facility] ORS;  Service: General;  Laterality: Right;  . SENTINEL NODE BIOPSY Left 01/16/2015   Procedure: SENTINEL NODE BIOPSY;  Surgeon: [name], MD;  Location: [facility] ORS;  Service: General;  Laterality: Left;  . TOTAL HIP ARTHROPLASTY Right 09/04/2015   Procedure: RIGHT TOTAL HIP ARTHROPLASTY ANTERIOR APPROACH;  Surgeon: [name], MD;  Location: [facility];  Service: Orthopedics;  Laterality: Right;    FAMILY HISTORY :   Family History  Problem Relation Age of Onset  . Breast cancer Sister 55  . Addison's disease Mother   . Hyperthyroidism Mother   . Osteoarthritis Mother   . Stroke Father   . Heart disease Father   . High blood pressure Father     SOCIAL HISTORY:   Social History   Tobacco Use  . Smoking status: Former Smoker    Packs/day: 0.25    Years: 5.00    Pack years: 1.25    Types: Cigarettes    Quit date: 07/28/1972    Years since quitting: 47.3  . Smokeless tobacco: Never Used  Substance Use Topics  . Alcohol use: Yes    Alcohol/week: 1.0 - 2.0 standard drinks    Types: 1 - 2 Glasses of wine per week    Comment: 1 Glass Wine / Night  . Drug use: No    ALLERGIES:  is allergic to donepezil.  MEDICATIONS:  Current Outpatient Medications  Medication Sig Dispense Refill  . acetaminophen (TYLENOL) 325 MG tablet Take 650 mg by mouth every 4 (four) hours as needed. Pain / increased temp.    . ARTIFICIAL TEARS 1 % ophthalmic solution Place 1 drop into both  eyes daily.    Marland Kitchen ascorbic acid (VITAMIN C) 500 MG tablet Take 1 tablet by mouth daily.    . Calcium Carbonate-Vitamin D (CALTRATE 600+D PO) Take 1 tablet by mouth daily.    . celecoxib (CELEBREX) 200 MG capsule Take 200 mg by mouth daily.     . Cetirizine HCl (ZYRTEC ALLERGY PO) Take 1 tablet by mouth as needed (allergies).    . chlorhexidine (PERIDEX) 0.12 % solution 15 mLs 2 (two) times daily.    Marland Kitchen levETIRAcetam (KEPPRA) 250 MG tablet 1 tablet daily at bedtime  5  . vitamin E 400 UNIT capsule Take 400 Units by mouth daily.     . traMADol (ULTRAM) 50 MG tablet Take 1 tablet (50 mg total) by mouth 2 (two) times daily as needed for moderate pain. (Patient not  taking: Reported on 11/09/2019) 60 tablet 0   No current facility-administered medications for this visit.   Facility-Administered Medications Ordered in Other Visits  Medication Dose Route Frequency Provider Last Rate Last Admin  . heparin lock flush 100 unit/mL  500 Units Intracatheter Once PRN Cammie Sickle, MD      . pertuzumab (PERJETA) 420 mg in sodium chloride 0.9 % 250 mL chemo infusion  420 mg Intravenous Once Charlaine Dalton R, MD      . sodium chloride flush (NS) 0.9 % injection 10 mL  10 mL Intravenous PRN Cammie Sickle, MD   10 mL at 07/07/18 0830  . trastuzumab-dkst (OGIVRI) 450 mg in sodium chloride 0.9 % 250 mL chemo infusion  450 mg Intravenous Once Cammie Sickle, MD        PHYSICAL EXAMINATION: ECOG PERFORMANCE STATUS: 2 - Symptomatic, <50% confined to bed  BP 118/60 (BP Location: Right Arm, Patient Position: Sitting)   Pulse 68   Temp (!) 96.8 F (36 C) (Tympanic)   Resp 20   Ht 5' 5"  (1.651 m)   Wt 165 lb (74.8 kg)   BMI 27.46 kg/m   Filed Weights   11/09/19 0839  Weight: 165 lb (74.8 kg)     Physical Activity:   . Days of Exercise per Week:   . Minutes of Exercise per Session:    .physical   LABORATORY DATA:  I have reviewed the data as listed    Component Value  Date/Time   NA 136 11/09/2019 0808   K 3.6 11/09/2019 0808   CL 99 11/09/2019 0808   CO2 26 11/09/2019 0808   GLUCOSE 183 (H) 11/09/2019 0808   BUN 13 11/09/2019 0808   CREATININE 0.79 11/09/2019 0808   CALCIUM 8.5 (L) 11/09/2019 0808   PROT 6.9 11/09/2019 0808   ALBUMIN 4.1 11/09/2019 0808   AST 24 11/09/2019 0808   ALT 19 11/09/2019 0808   ALKPHOS 64 11/09/2019 0808   BILITOT 0.7 11/09/2019 0808   GFRNONAA >60 11/09/2019 0808   GFRAA >60 11/09/2019 0808    No results found for: SPEP, UPEP  Lab Results  Component Value Date   WBC 5.3 11/09/2019   NEUTROABS 3.7 11/09/2019   HGB 13.8 11/09/2019   HCT 42.7 11/09/2019   MCV 93.0 11/09/2019   PLT 285 11/09/2019      Chemistry      Component Value Date/Time   NA 136 11/09/2019 0808   K 3.6 11/09/2019 0808   CL 99 11/09/2019 0808   CO2 26 11/09/2019 0808   BUN 13 11/09/2019 0808   CREATININE 0.79 11/09/2019 0808      Component Value Date/Time   CALCIUM 8.5 (L) 11/09/2019 0808   ALKPHOS 64 11/09/2019 0808   AST 24 11/09/2019 0808   ALT 19 11/09/2019 0808   BILITOT 0.7 11/09/2019 0808       Physical Exam  Constitutional: She is oriented to person, place, and time and well-developed, well-nourished, and in no distress.  Patient is accompanied by husband.  In a wheelchair.  HENT:  Head: Normocephalic and atraumatic.  Mouth/Throat: Oropharynx is clear and moist. No oropharyngeal exudate.  Eyes: Pupils are equal, round, and reactive to light.  Cardiovascular: Normal rate and regular rhythm.  Pulmonary/Chest: Effort normal and breath sounds normal. No respiratory distress. She has no wheezes.  Abdominal: Soft. Bowel sounds are normal. She exhibits no distension and no mass. There is no abdominal tenderness. There is no rebound and no  guarding.  Musculoskeletal:        General: No tenderness or edema. Normal range of motion.     Cervical back: Normal range of motion and neck supple.  Neurological: She is alert and  oriented to person, place, and time.  Skin: Skin is warm.  Psychiatric: Affect normal.    LABORATORY DATA:  I have reviewed the data as listed    Component Value Date/Time   NA 136 11/09/2019 0808   K 3.6 11/09/2019 0808   CL 99 11/09/2019 0808   CO2 26 11/09/2019 0808   GLUCOSE 183 (H) 11/09/2019 0808   BUN 13 11/09/2019 0808   CREATININE 0.79 11/09/2019 0808   CALCIUM 8.5 (L) 11/09/2019 0808   PROT 6.9 11/09/2019 0808   ALBUMIN 4.1 11/09/2019 0808   AST 24 11/09/2019 0808   ALT 19 11/09/2019 0808   ALKPHOS 64 11/09/2019 0808   BILITOT 0.7 11/09/2019 0808   GFRNONAA >60 11/09/2019 0808   GFRAA >60 11/09/2019 0808    No results found for: SPEP, UPEP  Lab Results  Component Value Date   WBC 5.3 11/09/2019   NEUTROABS 3.7 11/09/2019   HGB 13.8 11/09/2019   HCT 42.7 11/09/2019   MCV 93.0 11/09/2019   PLT 285 11/09/2019      Chemistry      Component Value Date/Time   NA 136 11/09/2019 0808   K 3.6 11/09/2019 0808   CL 99 11/09/2019 0808   CO2 26 11/09/2019 0808   BUN 13 11/09/2019 0808   CREATININE 0.79 11/09/2019 0808      Component Value Date/Time   CALCIUM 8.5 (L) 11/09/2019 0808   ALKPHOS 64 11/09/2019 0808   AST 24 11/09/2019 0808   ALT 19 11/09/2019 0808   BILITOT 0.7 11/09/2019 0808       RADIOGRAPHIC STUDIES: I have personally reviewed the radiological images as listed and agreed with the findings in the report. No results found.   ASSESSMENT & PLAN:  Carcinoma of overlapping sites of left breast in female, estrogen receptor negative ([place]) #Metastatic breast cancer ER [name] HER-2/neu positive.  FEB 4th 2021-CT scan chest and pelvis NED- except slightly larger RIGHT AXILLARY LN- 8 mm [from previous 16m]. Otherwise-stable sclerosis of the left manubrium.  Clinically stable.  # Proceed with Herceptin Perjeta.  Labs today reviewed; acceptable for treatment today.  We will plan to get imaging prior to next visit.  Scans ordered today.  # Drop  in ejection fraction-FEB 2021- 51%- clinically stable.  Will order at next visit.  # Chronic back and hip pain/ gait instability- STABLE;  on asper patch/ tramadol prn.  # Hypocalcemia-slightly worse 8.5; recommend adding calcium along with vitamin D.  # Debility/gait instability/dementia- needs constant attention given the risk of falls.  Stable.  DISPOSITION:  #Herceptin-Perjeta today # Follow up in 3 weeks-MD;labs-cbc/cmp;  Herceptin-Perjeta; CT Chest/A/P prior Dr.B    Orders Placed This Encounter  Procedures  . CT CHEST W CONTRAST    Standing Status:   Future    Standing Expiration Date:   11/08/2020    Order Specific Question:   If indicated for the ordered procedure, I authorize the administration of contrast media per Radiology protocol    Answer:   Yes    Order Specific Question:   Preferred imaging location?    Answer:   [facility]    Order Specific Question:   Radiology Contrast Protocol - do NOT remove file path    Answer:   \\  [email or web address]    Order Specific Question:   ** REASON FOR EXAM (FREE TEXT)    Answer:   breast cancer  . CT Abdomen Pelvis W Contrast    Standing Status:   Future    Standing Expiration Date:   11/08/2020    Order Specific Question:   ** REASON FOR EXAM (FREE TEXT)    Answer:   breast cancer    Order Specific Question:   If indicated for the ordered procedure, I authorize the administration of contrast media per Radiology protocol    Answer:   Yes    Order Specific Question:   Preferred imaging location?    Answer:   [facility]    Order Specific Question:   Is Oral Contrast requested for this exam?    Answer:   Yes, Per Radiology protocol    Order Specific Question:   Radiology Contrast Protocol - do NOT remove file path    Answer:   \\[email or web address]  . CBC with Differential    Standing Status:   Future    Standing Expiration Date:   11/08/2020  . Comprehensive metabolic  panel    Standing Status:   Future    Standing Expiration Date:   11/08/2020   All questions were answered. The patient knows to call the clinic with any problems, questions or concerns.      Cammie Sickle, MD 11/09/2019 9:19 AM

## 2019-11-23 NOTE — Progress Notes (Signed)
Pharmacist Chemotherapy Monitoring - Follow Up Assessment    I verify that I have reviewed each item in the below checklist:  . Regimen for the patient is scheduled for the appropriate day and plan matches scheduled date. Marland Kitchen Appropriate non-routine labs are ordered dependent on drug ordered. . If applicable, additional medications reviewed and ordered per protocol based on lifetime cumulative doses and/or treatment regimen.   Plan for follow-up and/or issues identified: No . I-vent associated with next due treatment: No . MD and/or nursing notified: No  [name] 11/23/2019 9:14 AM

## 2019-11-28 ENCOUNTER — Ambulatory Visit
Admission: RE | Admit: 2019-11-28 | Discharge: 2019-11-28 | Disposition: A | Discharge status: Home / Self Care | Payer: Medicare Other | Source: Ambulatory Visit | Attending: Internal Medicine | Admitting: Internal Medicine

## 2019-11-28 ENCOUNTER — Other Ambulatory Visit: Payer: Self-pay

## 2019-11-28 DIAGNOSIS — C50812 Malignant neoplasm of overlapping sites of left female breast: Secondary | ICD-10-CM

## 2019-11-28 DIAGNOSIS — Z171 Estrogen receptor negative status [ER-]: Secondary | ICD-10-CM | POA: Diagnosis present

## 2019-11-28 MED ORDER — IOHEXOL 300 MG/ML  SOLN
100.0000 mL | Freq: Once | INTRAMUSCULAR | Status: AC | PRN
  Administered 2019-11-28: 100 mL via INTRAVENOUS

## 2019-11-30 ENCOUNTER — Other Ambulatory Visit: Payer: Self-pay

## 2019-11-30 ENCOUNTER — Inpatient Hospital Stay: Payer: Medicare Other

## 2019-11-30 ENCOUNTER — Inpatient Hospital Stay (HOSPITAL_BASED_OUTPATIENT_CLINIC_OR_DEPARTMENT_OTHER): Payer: Medicare Other | Admitting: Internal Medicine

## 2019-11-30 ENCOUNTER — Encounter: Payer: Self-pay | Admitting: Internal Medicine

## 2019-11-30 ENCOUNTER — Inpatient Hospital Stay: Payer: Medicare Other | Attending: Internal Medicine

## 2019-11-30 DIAGNOSIS — Z5112 Encounter for antineoplastic immunotherapy: Secondary | ICD-10-CM | POA: Diagnosis present

## 2019-11-30 DIAGNOSIS — Z171 Estrogen receptor negative status [ER-]: Secondary | ICD-10-CM

## 2019-11-30 DIAGNOSIS — R2689 Other abnormalities of gait and mobility: Secondary | ICD-10-CM | POA: Insufficient documentation

## 2019-11-30 DIAGNOSIS — C50812 Malignant neoplasm of overlapping sites of left female breast: Secondary | ICD-10-CM | POA: Diagnosis present

## 2019-11-30 DIAGNOSIS — R5381 Other malaise: Secondary | ICD-10-CM | POA: Insufficient documentation

## 2019-11-30 DIAGNOSIS — F0390 Unspecified dementia without behavioral disturbance: Secondary | ICD-10-CM | POA: Insufficient documentation

## 2019-11-30 LAB — COMPREHENSIVE METABOLIC PANEL
ALT: 15 U/L (ref 0–44)
AST: 23 U/L (ref 15–41)
Albumin: 4 g/dL (ref 3.5–5.0)
Alkaline Phosphatase: 58 U/L (ref 38–126)
Anion gap: 10 (ref 5–15)
BUN: 12 mg/dL (ref 8–23)
CO2: 26 mmol/L (ref 22–32)
Calcium: 8.4 mg/dL — ABNORMAL LOW (ref 8.9–10.3)
Chloride: 101 mmol/L (ref 98–111)
Creatinine, Ser: 0.91 mg/dL (ref 0.44–1.00)
GFR calc Af Amer: >60 mL/min (ref >60)
GFR calc non Af Amer: >60 mL/min (ref >60)
Glucose, Bld: 163 mg/dL — ABNORMAL HIGH (ref 70–99)
Potassium: 3.5 mmol/L (ref 3.5–5.1)
Sodium: 137 mmol/L (ref 135–145)
Total Bilirubin: 0.6 mg/dL (ref 0.3–1.2)
Total Protein: 6.6 g/dL (ref 6.5–8.1)

## 2019-11-30 LAB — CBC WITH DIFFERENTIAL/PLATELET
Abs Immature Granulocytes: 0.02 10*3/uL (ref 0.00–0.07)
Basophils Absolute: 0 10*3/uL (ref 0.0–0.1)
Basophils Relative: 1 %
Eosinophils Absolute: 0.2 10*3/uL (ref 0.0–0.5)
Eosinophils Relative: 4 %
HCT: 41.8 % (ref 36.0–46.0)
Hemoglobin: 13.7 g/dL (ref 12.0–15.0)
Immature Granulocytes: 0 %
Lymphocytes Relative: 18 %
Lymphs Abs: 1.2 10*3/uL (ref 0.7–4.0)
MCH: 30.4 pg (ref 26.0–34.0)
MCHC: 32.8 g/dL (ref 30.0–36.0)
MCV: 92.9 fL (ref 80.0–100.0)
Monocytes Absolute: 0.4 10*3/uL (ref 0.1–1.0)
Monocytes Relative: 6 %
Neutro Abs: 4.5 10*3/uL (ref 1.7–7.7)
Neutrophils Relative %: 71 %
Platelets: 274 10*3/uL (ref 150–400)
RBC: 4.5 MIL/uL (ref 3.87–5.11)
RDW: 13.2 % (ref 11.5–15.5)
WBC: 6.4 10*3/uL (ref 4.0–10.5)
nRBC: 0 % (ref 0.0–0.2)

## 2019-11-30 MED ORDER — SODIUM CHLORIDE 0.9% FLUSH
10.0000 mL | Freq: Once | INTRAVENOUS | Status: AC
  Administered 2019-11-30: 10 mL via INTRAVENOUS
  Filled 2019-11-30: qty 10

## 2019-11-30 MED ORDER — DIPHENHYDRAMINE HCL 50 MG/ML IJ SOLN
12.5000 mg | Freq: Once | INTRAMUSCULAR | Status: AC
  Administered 2019-11-30: 12.5 mg via INTRAVENOUS
  Filled 2019-11-30: qty 1

## 2019-11-30 MED ORDER — HEPARIN SOD (PORK) LOCK FLUSH 100 UNIT/ML IV SOLN
500.0000 [IU] | Freq: Once | INTRAVENOUS | Status: AC
  Administered 2019-11-30: 500 [IU] via INTRAVENOUS
  Filled 2019-11-30: qty 5

## 2019-11-30 MED ORDER — SODIUM CHLORIDE 0.9 % IV SOLN
420.0000 mg | Freq: Once | INTRAVENOUS | Status: AC
  Administered 2019-11-30: 420 mg via INTRAVENOUS
  Filled 2019-11-30: qty 14

## 2019-11-30 MED ORDER — TRASTUZUMAB-DKST CHEMO 150 MG IV SOLR
450.0000 mg | Freq: Once | INTRAVENOUS | Status: AC
  Administered 2019-11-30: 450 mg via INTRAVENOUS
  Filled 2019-11-30: qty 21.43

## 2019-11-30 MED ORDER — SODIUM CHLORIDE 0.9 % IV SOLN
Freq: Once | INTRAVENOUS | Status: AC
  Filled 2019-11-30: qty 250

## 2019-11-30 MED ORDER — ACETAMINOPHEN 325 MG PO TABS
650.0000 mg | ORAL_TABLET | Freq: Once | ORAL | Status: AC
  Administered 2019-11-30: 650 mg via ORAL
  Filled 2019-11-30: qty 2

## 2019-11-30 MED ORDER — HEPARIN SOD (PORK) LOCK FLUSH 100 UNIT/ML IV SOLN
INTRAVENOUS | Status: AC
  Filled 2019-11-30: qty 5

## 2019-11-30 NOTE — Progress Notes (Signed)
[facility] OFFICE PROGRESS NOTE  Patient Care Team: [name], [name], MD (General Surgery) Requested, [name]   Oncology History Overview Note  # June 2016- LEFT BREAST CA;  [name] ; [name] ; [name]; Her 2 Neu POS; [place] from July OF 2016; [name]; adjuvant Herceptin [ Finished July 2017]; AUG 2017- Neratinib x5 days; DISCON sec to diarrhea  # MID OCT 2018- Right breast mass-Bx- ER/PR-NEG; Her 2 NEU POSITIVE 1~2.5cm;  [?NEW primary]  # MID-OCT 2018-METASTATIC RECURRENT-oh sternal mass; Left Ax LN [Bx]/periportal LN  # OCT 25th 2018- TAXOL-HERCEPTIN-PERJETA; Jan 2019- CT PR; continue HP only; on HOLD since NOV 2019--sec to drop in EF/Sieziues  # July 15th 2020- RE-STARTED HP   -------------------------------------------------------------------  # MUGA scan- July 28th 2017- 67%.  December 2019-EF 53%; January 2020 EF-52%  # chronic gait/balance issues  # Seizures/petit-mal/ Dr.Shah-Keppra [name] [id number]]  # June 2017- left breast Bx- fat necrosis [[name]  # july 2017-  BRCA 1& 2- NEG.   MOLECULAR TESTING- F ONE- TPS- 0%;  ERB2 amplification; PI3K/RET amplification Others**  # PALLIATIVE CARE: P  --------------------------------------------------    DIAGNOSIS: [ OCT 0076]- REC/MET- BREAST CA ER/PR-NEG; her 2 POS  STAGE: 4  ;GOALS: Palliative  CURRENT/MOST RECENT THERAPY- Herceptin-Perjeta [C]    Carcinoma of overlapping sites of left breast in female, estrogen receptor negative ([place])     INTERVAL HISTORY: Patient a poor historian given dementia.  Patient accompanied by husband.  [name] 79 y.o.  female pleasant patient above history of metastatic breast cancer on Herceptin plus Perjeta-is here for follow-up.  Patient continues get physical therapy and  Occupational Therapy at home to help avoid falls/gait instability.  Chronic hip pain back pain not any worse.  No new lumps or bumps.  No headaches.   Review of Systems  Unable to perform ROS: Dementia      PAST MEDICAL HISTORY :  Past Medical History:  Diagnosis Date  . Arthritis   . 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 Breast cancer ([place]) 2018   left breast; surgery 01/11/15 with Dr. Bary Castilla; path with invasive mammary and DCIS  . Breast cancer of upper-inner quadrant of left female breast ([place]) 12/15/14   Completed radiation end of December and finished chemotherapy 2 weeks ago, Left breast invasive mammary carcinoma, T1cN59mc (1.5 cm); Grade 3, [name], [name], [name], .  .Marland KitchenCataract    bilat   . DDD (degenerative disc disease), lumbar    Lumbar, previously evaluated by Dr. [place]arnestine Leys . D[place]   . Fibrocystic breast disease    prior biopsy  . Glaucoma   . 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 Hearing loss   . History of cancer chemotherapy   . History of radiation therapy   . Hyperlipidemia, unspecified   . Imbalance   . Memory impairment    seen by Dr SManuella Ghazi possible post crainiotomy from radiation  . 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  . Numbness and tingling    right hand   . Osteoarthritis   . Osteoporosis, post-menopausal   . Personal history of chemotherapy   .  Personal history of radiation therapy   . Shoulder pain, right   . Wears glasses     PAST SURGICAL HISTORY :   Past Surgical History:  Procedure Laterality Date  . APPENDECTOMY  1950  . BRAIN SURGERY  1995   left frontal/temporal  . BREAST BIOPSY Left 12/15/14   confirmed DCIS  . BREAST BIOPSY Left 01/08/2015   Procedure: BREAST BIOPSY WITH NEEDLE LOCALIZATION;  Surgeon: [name], MD;  Location: [facility] ORS;  Service: General;  Laterality: Left;  .  BREAST EXCISIONAL BIOPSY Left 1997  . BREAST LUMPECTOMY Left 01/08/2015   Procedure: LUMPECTOMY;  Surgeon: [name], MD;  Location: [facility] ORS;  Service: General;  Laterality: Left;  . COLONOSCOPY  2010   Dr. [name]  . ORIF ANKLE FRACTURE Right 08/05/2017   Procedure: OPEN REDUCTION INTERNAL FIXATION (ORIF) ANKLE FRACTURE;  Surgeon: Earnestine Leys, MD;  Location: [facility] ORS;  Service: Orthopedics;  Laterality: Right;  . PORTACATH PLACEMENT Right 01/16/2015   Procedure: INSERTION PORT-A-CATH;  Surgeon: [name], MD;  Location: [facility] ORS;  Service: General;  Laterality: Right;  . SENTINEL NODE BIOPSY Left 01/16/2015   Procedure: SENTINEL NODE BIOPSY;  Surgeon: [name], MD;  Location: [facility] ORS;  Service: General;  Laterality: Left;  . TOTAL HIP ARTHROPLASTY Right 09/04/2015   Procedure: RIGHT TOTAL HIP ARTHROPLASTY ANTERIOR APPROACH;  Surgeon: [name], MD;  Location: [facility];  Service: Orthopedics;  Laterality: Right;    FAMILY HISTORY :   Family History  Problem Relation Age of Onset  . Breast cancer Sister 51  . Addison's disease Mother   . Hyperthyroidism Mother   . Osteoarthritis Mother   . Stroke Father   . Heart disease Father   . High blood pressure Father     SOCIAL HISTORY:   Social History   Tobacco Use  . Smoking status: Former Smoker    Packs/day: 0.25    Years: 5.00    Pack years: 1.25    Types: Cigarettes    Quit date: 07/28/1972    Years since quitting: 47.3  . Smokeless tobacco: Never Used  Substance Use Topics  . Alcohol use: Yes    Alcohol/week: 1.0 - 2.0 standard drinks    Types: 1 - 2 Glasses of wine per week    Comment: 1 Glass Wine / Night  . Drug use: No    ALLERGIES:  is allergic to donepezil.  MEDICATIONS:  Current Outpatient Medications  Medication Sig Dispense Refill  . acetaminophen (TYLENOL) 325 MG tablet Take 650 mg by mouth every 4 (four) hours as needed. Pain / increased temp.    . ARTIFICIAL TEARS 1 % ophthalmic  solution Place 1 drop into both eyes daily.    Marland Kitchen ascorbic acid (VITAMIN C) 500 MG tablet Take 1 tablet by mouth daily.    . Calcium Carbonate-Vitamin D (CALTRATE 600+D PO) Take 1 tablet by mouth daily.    . celecoxib (CELEBREX) 200 MG capsule Take 200 mg by mouth daily.     . Cetirizine HCl (ZYRTEC ALLERGY PO) Take 1 tablet by mouth as needed (allergies).    . chlorhexidine (PERIDEX) 0.12 % solution 15 mLs 2 (two) times daily.    Marland Kitchen levETIRAcetam (KEPPRA) 250 MG tablet 1 tablet daily at bedtime  5  . traMADol (ULTRAM) 50 MG tablet Take 1 tablet (50 mg total) by mouth 2 (two) times daily as needed for moderate pain. 60 tablet 0  . vitamin E 400 UNIT capsule  Take 400 Units by mouth daily.      No current facility-administered medications for this visit.   Facility-Administered Medications Ordered in Other Visits  Medication Dose Route Frequency Provider Last Rate Last Admin  . heparin lock flush 100 unit/mL  500 Units Intravenous Once Cammie Sickle, MD      . pertuzumab (PERJETA) 420 mg in sodium chloride 0.9 % 250 mL chemo infusion  420 mg Intravenous Once Charlaine Dalton R, MD      . sodium chloride flush (NS) 0.9 % injection 10 mL  10 mL Intravenous PRN Cammie Sickle, MD   10 mL at 07/07/18 0830    PHYSICAL EXAMINATION: ECOG PERFORMANCE STATUS: 2 - Symptomatic, <50% confined to bed  BP 110/62 (BP Location: Right Arm, Patient Position: Sitting, Cuff Size: Normal)   Pulse 70   Temp (!) 97.1 F (36.2 C) (Tympanic)   Wt 164 lb 8 oz (74.6 kg)   BMI 27.37 kg/m   Filed Weights   11/30/19 0838  Weight: 164 lb 8 oz (74.6 kg)     Physical Activity:   . Days of Exercise per Week:   . Minutes of Exercise per Session:    .physical   LABORATORY DATA:  I have reviewed the data as listed    Component Value Date/Time   NA 137 11/30/2019 0814   K 3.5 11/30/2019 0814   CL 101 11/30/2019 0814   CO2 26 11/30/2019 0814   GLUCOSE 163 (H) 11/30/2019 0814   BUN 12  11/30/2019 0814   CREATININE 0.91 11/30/2019 0814   CALCIUM 8.4 (L) 11/30/2019 0814   PROT 6.6 11/30/2019 0814   ALBUMIN 4.0 11/30/2019 0814   AST 23 11/30/2019 0814   ALT 15 11/30/2019 0814   ALKPHOS 58 11/30/2019 0814   BILITOT 0.6 11/30/2019 0814   GFRNONAA >60 11/30/2019 0814   GFRAA >60 11/30/2019 0814    No results found for: SPEP, UPEP  Lab Results  Component Value Date   WBC 6.4 11/30/2019   NEUTROABS 4.5 11/30/2019   HGB 13.7 11/30/2019   HCT 41.8 11/30/2019   MCV 92.9 11/30/2019   PLT 274 11/30/2019      Chemistry      Component Value Date/Time   NA 137 11/30/2019 0814   K 3.5 11/30/2019 0814   CL 101 11/30/2019 0814   CO2 26 11/30/2019 0814   BUN 12 11/30/2019 0814   CREATININE 0.91 11/30/2019 0814      Component Value Date/Time   CALCIUM 8.4 (L) 11/30/2019 0814   ALKPHOS 58 11/30/2019 0814   AST 23 11/30/2019 0814   ALT 15 11/30/2019 0814   BILITOT 0.6 11/30/2019 0814       Physical Exam  Constitutional: She is oriented to person, place, and time and well-developed, well-nourished, and in no distress.  Patient is accompanied by husband.  In a wheelchair.  HENT:  Head: Normocephalic and atraumatic.  Mouth/Throat: Oropharynx is clear and moist. No oropharyngeal exudate.  Eyes: Pupils are equal, round, and reactive to light.  Cardiovascular: Normal rate and regular rhythm.  Pulmonary/Chest: Effort normal and breath sounds normal. No respiratory distress. She has no wheezes.  Abdominal: Soft. Bowel sounds are normal. She exhibits no distension and no mass. There is no abdominal tenderness. There is no rebound and no guarding.  Musculoskeletal:        General: No tenderness or edema. Normal range of motion.     Cervical back: Normal range of motion and neck  supple.  Neurological: She is alert and oriented to person, place, and time.  Skin: Skin is warm.  Psychiatric: Affect normal.    LABORATORY DATA:  I have reviewed the data as listed     Component Value Date/Time   NA 137 11/30/2019 0814   K 3.5 11/30/2019 0814   CL 101 11/30/2019 0814   CO2 26 11/30/2019 0814   GLUCOSE 163 (H) 11/30/2019 0814   BUN 12 11/30/2019 0814   CREATININE 0.91 11/30/2019 0814   CALCIUM 8.4 (L) 11/30/2019 0814   PROT 6.6 11/30/2019 0814   ALBUMIN 4.0 11/30/2019 0814   AST 23 11/30/2019 0814   ALT 15 11/30/2019 0814   ALKPHOS 58 11/30/2019 0814   BILITOT 0.6 11/30/2019 0814   GFRNONAA >60 11/30/2019 0814   GFRAA >60 11/30/2019 0814    No results found for: SPEP, UPEP  Lab Results  Component Value Date   WBC 6.4 11/30/2019   NEUTROABS 4.5 11/30/2019   HGB 13.7 11/30/2019   HCT 41.8 11/30/2019   MCV 92.9 11/30/2019   PLT 274 11/30/2019      Chemistry      Component Value Date/Time   NA 137 11/30/2019 0814   K 3.5 11/30/2019 0814   CL 101 11/30/2019 0814   CO2 26 11/30/2019 0814   BUN 12 11/30/2019 0814   CREATININE 0.91 11/30/2019 0814      Component Value Date/Time   CALCIUM 8.4 (L) 11/30/2019 0814   ALKPHOS 58 11/30/2019 0814   AST 23 11/30/2019 0814   ALT 15 11/30/2019 0814   BILITOT 0.6 11/30/2019 0814       RADIOGRAPHIC STUDIES: I have personally reviewed the radiological images as listed and agreed with the findings in the report. No results found.   ASSESSMENT & PLAN:  Carcinoma of overlapping sites of left breast in female, estrogen receptor negative ([place]) #Metastatic breast cancer ER [name] HER-2/neu positive. MAY 1st 2021-CT scan chest and pelvis NED- except stable RIGHT AXILLARY LN- 31m; stable sclerosis of the left manubrium.  Right anterior pulmonary 2 to 4 mm nodule [see below] clinically STABLE.   # Proceed with Herceptin Perjeta.  Labs today reviewed; acceptable for treatment today.   # Drop in ejection fraction-FEB 2021- 51%- clinically STABLE.  Will order at next visit.  # Chronic back and hip pain/ gait instability- STABLE. on asper patch/ tramadol prn.  #Incidental right middle lobe  anterior 2 to 4 mm nodules-very subtle changes.  Asymptomatic.  Monitor for now  # Hypocalcemia-STABLE 8.5; recommend adding calcium along with vitamin D.  # Debility/gait instability/dementia- needs constant attention given the risk of falls.  Stable.  DISPOSITION:  #Herceptin-Perjeta today # Follow up in 3 weeks-MD;labs-cbc/cmp;  Herceptin-Perjeta;- Dr.B    No orders of the defined types were placed in this encounter.  All questions were answered. The patient knows to call the clinic with any problems, questions or concerns.      GCammie Sickle MD 11/30/2019 10:16 AM

## 2019-11-30 NOTE — Assessment & Plan Note (Addendum)
#  Metastatic breast cancer ER [name] HER-2/neu positive. MAY 1st 2021-CT scan chest and pelvis NED- except stable RIGHT AXILLARY LN- 22m; stable sclerosis of the left manubrium.  Right anterior pulmonary 2 to 4 mm nodule [see below] clinically STABLE.   # Proceed with Herceptin Perjeta.  Labs today reviewed; acceptable for treatment today.   # Drop in ejection fraction-FEB 2021- 51%- clinically STABLE.  Will order at next visit.  # Chronic back and hip pain/ gait instability- STABLE. on asper patch/ tramadol prn.  #Incidental right middle lobe anterior 2 to 4 mm nodules-very subtle changes.  Asymptomatic.  Monitor for now  # Hypocalcemia-STABLE 8.5; recommend adding calcium along with vitamin D.  # Debility/gait instability/dementia- needs constant attention given the risk of falls.  Stable.  DISPOSITION:  #Herceptin-Perjeta today # Follow up in 3 weeks-MD;labs-cbc/cmp;  Herceptin-Perjeta;- Dr.B

## 2019-12-14 NOTE — Progress Notes (Signed)

## 2019-12-21 ENCOUNTER — Inpatient Hospital Stay: Payer: Medicare Other

## 2019-12-21 ENCOUNTER — Encounter: Payer: Self-pay | Admitting: Internal Medicine

## 2019-12-21 ENCOUNTER — Inpatient Hospital Stay (HOSPITAL_BASED_OUTPATIENT_CLINIC_OR_DEPARTMENT_OTHER): Payer: Medicare Other | Admitting: Internal Medicine

## 2019-12-21 ENCOUNTER — Other Ambulatory Visit: Payer: Self-pay

## 2019-12-21 VITALS — BP 112/62 | HR 68 | Temp 97.8°F | Resp 20 | Ht 65.0 in | Wt 166.0 lb

## 2019-12-21 DIAGNOSIS — Z79899 Other long term (current) drug therapy: Secondary | ICD-10-CM

## 2019-12-21 DIAGNOSIS — Z171 Estrogen receptor negative status [ER-]: Secondary | ICD-10-CM | POA: Diagnosis not present

## 2019-12-21 DIAGNOSIS — Z5111 Encounter for antineoplastic chemotherapy: Secondary | ICD-10-CM

## 2019-12-21 DIAGNOSIS — Z5181 Encounter for therapeutic drug level monitoring: Secondary | ICD-10-CM

## 2019-12-21 DIAGNOSIS — Z95828 Presence of other vascular implants and grafts: Secondary | ICD-10-CM

## 2019-12-21 DIAGNOSIS — C50812 Malignant neoplasm of overlapping sites of left female breast: Secondary | ICD-10-CM

## 2019-12-21 DIAGNOSIS — Z9221 Personal history of antineoplastic chemotherapy: Secondary | ICD-10-CM

## 2019-12-21 DIAGNOSIS — Z5112 Encounter for antineoplastic immunotherapy: Secondary | ICD-10-CM | POA: Diagnosis not present

## 2019-12-21 LAB — COMPREHENSIVE METABOLIC PANEL
ALT: 16 U/L (ref 0–44)
AST: 24 U/L (ref 15–41)
Albumin: 3.9 g/dL (ref 3.5–5.0)
Alkaline Phosphatase: 62 U/L (ref 38–126)
Anion gap: 8 (ref 5–15)
BUN: 13 mg/dL (ref 8–23)
CO2: 27 mmol/L (ref 22–32)
Calcium: 8.5 mg/dL — ABNORMAL LOW (ref 8.9–10.3)
Chloride: 101 mmol/L (ref 98–111)
Creatinine, Ser: 0.78 mg/dL (ref 0.44–1.00)
GFR calc Af Amer: >60 mL/min (ref >60)
GFR calc non Af Amer: >60 mL/min (ref >60)
Glucose, Bld: 164 mg/dL — ABNORMAL HIGH (ref 70–99)
Potassium: 3.3 mmol/L — ABNORMAL LOW (ref 3.5–5.1)
Sodium: 136 mmol/L (ref 135–145)
Total Bilirubin: 0.6 mg/dL (ref 0.3–1.2)
Total Protein: 6.9 g/dL (ref 6.5–8.1)

## 2019-12-21 LAB — CBC WITH DIFFERENTIAL/PLATELET
Abs Immature Granulocytes: 0.02 10*3/uL (ref 0.00–0.07)
Basophils Absolute: 0 10*3/uL (ref 0.0–0.1)
Basophils Relative: 1 %
Eosinophils Absolute: 0.3 10*3/uL (ref 0.0–0.5)
Eosinophils Relative: 4 %
HCT: 42.7 % (ref 36.0–46.0)
Hemoglobin: 13.9 g/dL (ref 12.0–15.0)
Immature Granulocytes: 0 %
Lymphocytes Relative: 15 %
Lymphs Abs: 0.9 10*3/uL (ref 0.7–4.0)
MCH: 30.2 pg (ref 26.0–34.0)
MCHC: 32.6 g/dL (ref 30.0–36.0)
MCV: 92.6 fL (ref 80.0–100.0)
Monocytes Absolute: 0.3 10*3/uL (ref 0.1–1.0)
Monocytes Relative: 6 %
Neutro Abs: 4.5 10*3/uL (ref 1.7–7.7)
Neutrophils Relative %: 74 %
Platelets: 271 10*3/uL (ref 150–400)
RBC: 4.61 MIL/uL (ref 3.87–5.11)
RDW: 13.1 % (ref 11.5–15.5)
WBC: 6.1 10*3/uL (ref 4.0–10.5)
nRBC: 0 % (ref 0.0–0.2)

## 2019-12-21 MED ORDER — SODIUM CHLORIDE 0.9 % IV SOLN
Freq: Once | INTRAVENOUS | Status: AC
  Filled 2019-12-21: qty 250

## 2019-12-21 MED ORDER — SODIUM CHLORIDE 0.9% FLUSH
10.0000 mL | INTRAVENOUS | Status: DC | PRN
  Administered 2019-12-21: 10 mL via INTRAVENOUS
  Filled 2019-12-21: qty 10

## 2019-12-21 MED ORDER — TRASTUZUMAB-DKST CHEMO 150 MG IV SOLR
450.0000 mg | Freq: Once | INTRAVENOUS | Status: AC
  Administered 2019-12-21: 450 mg via INTRAVENOUS
  Filled 2019-12-21: qty 21.43

## 2019-12-21 MED ORDER — ACETAMINOPHEN 325 MG PO TABS
650.0000 mg | ORAL_TABLET | Freq: Once | ORAL | Status: AC
  Administered 2019-12-21: 650 mg via ORAL
  Filled 2019-12-21: qty 2

## 2019-12-21 MED ORDER — DIPHENHYDRAMINE HCL 50 MG/ML IJ SOLN
12.5000 mg | Freq: Once | INTRAMUSCULAR | Status: AC
  Administered 2019-12-21: 12.5 mg via INTRAVENOUS
  Filled 2019-12-21: qty 1

## 2019-12-21 MED ORDER — HEPARIN SOD (PORK) LOCK FLUSH 100 UNIT/ML IV SOLN
500.0000 [IU] | Freq: Once | INTRAVENOUS | Status: AC | PRN
  Administered 2019-12-21: 500 [IU]
  Filled 2019-12-21: qty 5

## 2019-12-21 MED ORDER — SODIUM CHLORIDE 0.9 % IV SOLN
420.0000 mg | Freq: Once | INTRAVENOUS | Status: AC
  Administered 2019-12-21: 420 mg via INTRAVENOUS
  Filled 2019-12-21: qty 14

## 2019-12-21 NOTE — Assessment & Plan Note (Addendum)
#  Metastatic breast cancer ER [name] HER-2/neu positive. MAY 1st 2021-CT scan chest and pelvis NED- except stable RIGHT AXILLARY LN- 14m; stable sclerosis of the left manubrium.  Right anterior pulmonary 2 to 4 mm nodule [see below] clinically STABLE.   # Proceed with Herceptin Perjeta.  Labs today reviewed; acceptable for treatment today.   # Drop in ejection fraction-FEB 2021- 51%- clinically stable; will order MUGA scan.   # Chronic back and hip pain/ gait instability- STABLE. on asper patch/ tramadol prn.  #Left great toe paronychia/fungal infection-awaiting evaluation with podiatry.  Recommend Epson salt soak.  DISPOSITION:  #Herceptin-Perjeta today # Follow up in 3 weeks-MD;labs-cbc/cmp;  Herceptin-Perjeta; Dr.B

## 2019-12-21 NOTE — Progress Notes (Signed)
[name], [name], MD (General Surgery) Requested, [name]   Oncology History Overview Note  # June 2016- LEFT BREAST CA;  [name] ; [name] ; [name]; Her 2 Neu POS; [place] from July OF 2016; [name]; adjuvant Herceptin [ Finished July 2017]; AUG 2017- Neratinib x5 days; DISCON sec to diarrhea  # MID OCT 2018- Right breast mass-Bx- ER/PR-NEG; Her 2 NEU POSITIVE 1~2.5cm;  [?NEW primary]  # MID-OCT 2018-METASTATIC RECURRENT-oh sternal mass; Left Ax LN [Bx]/periportal LN  # OCT 25th 2018- TAXOL-HERCEPTIN-PERJETA; Jan 2019- CT PR; continue HP only; on HOLD since NOV 2019--sec to drop in EF/Sieziues  # July 15th 2020- RE-STARTED HP   -------------------------------------------------------------------  # MUGA scan- July 28th 2017- 67%.  December 2019-EF 53%; January 2020 EF-52%  # chronic gait/balance issues  # Seizures/petit-mal/ Dr.Shah-Keppra [name] [id number]]  # June 2017- left breast Bx- fat necrosis [[name]  # july 2017-  BRCA 1& 2- NEG.   MOLECULAR TESTING- F ONE- TPS- 0%;  ERB2 amplification; PI3K/RET amplification Others**  # PALLIATIVE CARE: P  --------------------------------------------------    DIAGNOSIS: [ OCT 6440]- REC/MET- BREAST CA ER/PR-NEG; her 2 POS  STAGE: 4  ;GOALS: Palliative  CURRENT/MOST RECENT THERAPY- Herceptin-Perjeta [C]    Carcinoma of overlapping sites of left breast in female, estrogen receptor negative ([place])     INTERVAL HISTORY: Patient a poor historian given dementia.  Patient accompanied by husband.  [name] 79 y.o.  female pleasant patient above history of metastatic breast cancer on Herceptin plus Perjeta-is here for follow-up.  As per the husband noted to have left great  toe fungal infection.  Improved with topical antifungal.  Otherwise denies any new onset of back pain or joint pains or lumps or bumps.  No headaches.  No falls.   Review of Systems  Unable to perform ROS: Dementia    PAST MEDICAL HISTORY :  Past Medical History:  Diagnosis Date  . Arthritis   . Brain tumor ([place]) 1995   meningeoma  . Breast cancer ([place]) 2018   left breast; surgery 01/11/15 with Dr. Bary Castilla; path with invasive mammary and DCIS  . Breast cancer of upper-inner quadrant of left female breast ([place]) 12/15/14   Completed radiation end of December and finished chemotherapy 2 weeks ago, Left breast invasive mammary carcinoma, T1cN23mc (1.5 cm); Grade 3, [name], [name], [name], .  .Marland KitchenCataract    bilat   . DDD (degenerative disc disease), lumbar    Lumbar, previously evaluated by Dr. HEarnestine Leys . Dementia ([place]   . Fibrocystic breast disease    prior biopsy  . Glaucoma   . 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 Hearing loss   . History of cancer chemotherapy   . History of radiation therapy   . Hyperlipidemia, unspecified   . Imbalance   . Memory impairment    seen by Dr SManuella Ghazi possible post crainiotomy from radiation  . Meningioma ([place]    Left cavernous sinus meningioma, [facility].  . Numbness and tingling    right hand   . Osteoarthritis   . Osteoporosis, post-menopausal   . Personal history of  chemotherapy   . Personal history of radiation therapy   . Shoulder pain, right   . Wears glasses     PAST SURGICAL HISTORY :   Past Surgical History:  Procedure Laterality Date  . APPENDECTOMY  1950  . BRAIN SURGERY  1995   left frontal/temporal  . BREAST BIOPSY Left 12/15/14   confirmed DCIS  . BREAST BIOPSY Left 01/08/2015   Procedure: BREAST BIOPSY WITH NEEDLE LOCALIZATION;  Surgeon: [name], MD;  Location: [facility] ORS;  Service: General;  Laterality:  Left;  . BREAST EXCISIONAL BIOPSY Left 1997  . BREAST LUMPECTOMY Left 01/08/2015   Procedure: LUMPECTOMY;  Surgeon: [name], MD;  Location: [facility];  Service: General;  Laterality: Left;  . COLONOSCOPY  2010   Dr. [name]  . ORIF ANKLE FRACTURE Right 08/05/2017   Procedure: OPEN REDUCTION INTERNAL FIXATION (ORIF) ANKLE FRACTURE;  Surgeon: Earnestine Leys, MD;  Location: [facility];  Service: Orthopedics;  Laterality: Right;  . PORTACATH PLACEMENT Right 01/16/2015   Procedure: INSERTION PORT-A-CATH;  Surgeon: [name], MD;  Location: [facility];  Service: General;  Laterality: Right;  . SENTINEL NODE BIOPSY Left 01/16/2015   Procedure: SENTINEL NODE BIOPSY;  Surgeon: [name], MD;  Location: [facility];  Service: General;  Laterality: Left;  . TOTAL HIP ARTHROPLASTY Right 09/04/2015   Procedure: RIGHT TOTAL HIP ARTHROPLASTY ANTERIOR APPROACH;  Surgeon: [name], MD;  Location: [facility];  Service: Orthopedics;  Laterality: Right;    FAMILY HISTORY :   Family History  Problem Relation Age of Onset  . Breast cancer Sister 49  . Addison's disease Mother   . Hyperthyroidism Mother   . Osteoarthritis Mother   . Stroke Father   . Heart disease Father   . High blood pressure Father     SOCIAL HISTORY:   Social History   Tobacco Use  . Smoking status: Former Smoker    Packs/day: 0.25    Years: 5.00    Pack years: 1.25    Types: Cigarettes    Quit date: 07/28/1972    Years since quitting: 47.4  . Smokeless tobacco: Never Used  Substance Use Topics  . Alcohol use: Yes    Alcohol/week: 1.0 - 2.0 standard drinks    Types: 1 - 2 Glasses of wine per week    Comment: 1 Glass Wine / Night  . Drug use: No    ALLERGIES:  is allergic to donepezil.  MEDICATIONS:  Current Outpatient Medications  Medication Sig Dispense Refill  . acetaminophen (TYLENOL) 325 MG tablet Take 650 mg by mouth every 4 (four) hours as needed. Pain / increased temp.    . ARTIFICIAL TEARS 1 %  ophthalmic solution Place 1 drop into both eyes daily.    Marland Kitchen ascorbic acid (VITAMIN C) 500 MG tablet Take 1 tablet by mouth daily.    . Calcium Carbonate-Vitamin D (CALTRATE 600+D PO) Take 1 tablet by mouth daily.    . celecoxib (CELEBREX) 200 MG capsule Take 200 mg by mouth daily.     . Cetirizine HCl (ZYRTEC ALLERGY PO) Take 1 tablet by mouth as needed (allergies).    . chlorhexidine (PERIDEX) 0.12 % solution 15 mLs 2 (two) times daily.    Marland Kitchen levETIRAcetam (KEPPRA) 250 MG tablet 1 tablet daily at bedtime  5  . traMADol (ULTRAM) 50 MG tablet Take 1 tablet (50 mg total) by mouth 2 (two) times daily as needed for moderate pain. 60 tablet 0  . vitamin  E 400 UNIT capsule Take 400 Units by mouth daily.      No current facility-administered medications for this visit.   Facility-Administered Medications Ordered in Other Visits  Medication Dose Route Frequency Provider Last Rate Last Admin  . sodium chloride flush (NS) 0.9 % injection 10 mL  10 mL Intravenous PRN Cammie Sickle, MD   10 mL at 07/07/18 0830    PHYSICAL EXAMINATION: ECOG PERFORMANCE STATUS: 2 - Symptomatic, <50% confined to bed  BP 112/62 (Patient Position: Sitting)   Pulse 68   Temp 97.8 F (36.6 C) (Oral)   Resp 20   Ht _0  (1.651 m)   Wt 166 lb (75.3 kg)   BMI 27.62 kg/m   Filed Weights   12/21/19 0829  Weight: 166 lb (75.3 kg)     Physical Exam  Constitutional: She is oriented to person, place, and time and well-developed, well-nourished, and in no distress.  Patient is accompanied by husband.  In a wheelchair.  HENT:  Head: Normocephalic and atraumatic.  Mouth/Throat: Oropharynx is clear and moist. No oropharyngeal exudate.  Eyes: Pupils are equal, round, and reactive to light.  Cardiovascular: Normal rate and regular rhythm.  Pulmonary/Chest: Effort normal and breath sounds normal. No respiratory distress. She has no wheezes.  Abdominal: Soft. Bowel sounds are normal. She exhibits no distension and  no mass. There is no abdominal tenderness. There is no rebound and no guarding.  Musculoskeletal:        General: No tenderness or edema. Normal range of motion.     Cervical back: Normal range of motion and neck supple.  Neurological: She is alert and oriented to person, place, and time.  Skin: Skin is warm.  Psychiatric: Affect normal.    LABORATORY DATA:  I have reviewed the data as listed    Component Value Date/Time   NA 136 12/21/2019 0806   K 3.3 (L) 12/21/2019 0806   CL 101 12/21/2019 0806   CO2 27 12/21/2019 0806   GLUCOSE 164 (H) 12/21/2019 0806   BUN 13 12/21/2019 0806   CREATININE 0.78 12/21/2019 0806   CALCIUM 8.5 (L) 12/21/2019 0806   PROT 6.9 12/21/2019 0806   ALBUMIN 3.9 12/21/2019 0806   AST 24 12/21/2019 0806   ALT 16 12/21/2019 0806   ALKPHOS 62 12/21/2019 0806   BILITOT 0.6 12/21/2019 0806   GFRNONAA >60 12/21/2019 0806   GFRAA >60 12/21/2019 0806    No results found for: SPEP, UPEP  Lab Results  Component Value Date   WBC 6.1 12/21/2019   NEUTROABS 4.5 12/21/2019   HGB 13.9 12/21/2019   HCT 42.7 12/21/2019   MCV 92.6 12/21/2019   PLT 271 12/21/2019      Chemistry      Component Value Date/Time   NA 136 12/21/2019 0806   K 3.3 (L) 12/21/2019 0806   CL 101 12/21/2019 0806   CO2 27 12/21/2019 0806   BUN 13 12/21/2019 0806   CREATININE 0.78 12/21/2019 0806      Component Value Date/Time   CALCIUM 8.5 (L) 12/21/2019 0806   ALKPHOS 62 12/21/2019 0806   AST 24 12/21/2019 0806   ALT 16 12/21/2019 0806   BILITOT 0.6 12/21/2019 0806       RADIOGRAPHIC STUDIES: I have personally reviewed the radiological images as listed and agreed with the findings in the report. No results found.   ASSESSMENT & PLAN:  Carcinoma of overlapping sites of left breast in female, estrogen receptor negative ([place]) #Metastatic  breast cancer ER [name] HER-2/neu positive. MAY 1st 2021-CT scan chest and pelvis NED- except stable RIGHT AXILLARY LN- 46m; stable  sclerosis of the left manubrium.  Right anterior pulmonary 2 to 4 mm nodule [see below] clinically STABLE.   # Proceed with Herceptin Perjeta.  Labs today reviewed; acceptable for treatment today.   # Drop in ejection fraction-FEB 2021- 51%- clinically stable; will order MUGA scan.   # Chronic back and hip pain/ gait instability- STABLE. on asper patch/ tramadol prn.  #Left great toe paronychia/fungal infection-awaiting evaluation with podiatry.  Recommend Epson salt soak.  DISPOSITION:  #Herceptin-Perjeta today # Follow up in 3 weeks-MD;labs-cbc/cmp;  Herceptin-Perjeta; Dr.B    Orders Placed This Encounter  Procedures  . NM Cardiac [facility] Rest    Standing Status:   Future    Standing Expiration Date:   12/20/2020    Order Specific Question:   caspofungin (CANCIDAS) frequency    Answer:   Q 24H    Order Specific Question:   Preferred imaging location?    Answer:   [facility]    Order Specific Question:   Radiology Contrast Protocol - do NOT remove file path    Answer:   [email or web address]    Order Specific Question:   ** REASON FOR EXAM (FREE TEXT)    Answer:   breas cancer on chemo   All questions were answered. The patient knows to call the clinic with any problems, questions or concerns.      GCammie Sickle MD 12/21/2019 12:38 PM

## 2020-01-03 ENCOUNTER — Other Ambulatory Visit: Payer: Self-pay | Admitting: General Surgery

## 2020-01-03 DIAGNOSIS — Z853 Personal history of malignant neoplasm of breast: Secondary | ICD-10-CM

## 2020-01-09 ENCOUNTER — Other Ambulatory Visit: Payer: Self-pay

## 2020-01-09 ENCOUNTER — Encounter
Admission: RE | Admit: 2020-01-09 | Discharge: 2020-01-09 | Disposition: A | Discharge status: Home / Self Care | Payer: Medicare Other | Source: Ambulatory Visit | Attending: Internal Medicine | Admitting: Internal Medicine

## 2020-01-09 DIAGNOSIS — Z5181 Encounter for therapeutic drug level monitoring: Secondary | ICD-10-CM | POA: Insufficient documentation

## 2020-01-09 DIAGNOSIS — C50812 Malignant neoplasm of overlapping sites of left female breast: Secondary | ICD-10-CM | POA: Insufficient documentation

## 2020-01-09 DIAGNOSIS — Z79899 Other long term (current) drug therapy: Secondary | ICD-10-CM | POA: Insufficient documentation

## 2020-01-09 DIAGNOSIS — Z171 Estrogen receptor negative status [ER-]: Secondary | ICD-10-CM | POA: Insufficient documentation

## 2020-01-09 MED ORDER — TECHNETIUM TC 99M-LABELED RED BLOOD CELLS IV KIT
20.0000 | PACK | Freq: Once | INTRAVENOUS | Status: AC | PRN
  Administered 2020-01-09: 21.468 via INTRAVENOUS

## 2020-01-11 ENCOUNTER — Other Ambulatory Visit: Payer: Self-pay

## 2020-01-11 ENCOUNTER — Inpatient Hospital Stay (HOSPITAL_BASED_OUTPATIENT_CLINIC_OR_DEPARTMENT_OTHER): Payer: Medicare Other | Admitting: Internal Medicine

## 2020-01-11 ENCOUNTER — Inpatient Hospital Stay: Payer: Medicare Other

## 2020-01-11 ENCOUNTER — Inpatient Hospital Stay: Payer: Medicare Other | Attending: Internal Medicine

## 2020-01-11 ENCOUNTER — Encounter: Payer: Self-pay | Admitting: Internal Medicine

## 2020-01-11 VITALS — BP 116/64 | Temp 97.3°F | Ht 65.0 in | Wt 165.6 lb

## 2020-01-11 DIAGNOSIS — Z171 Estrogen receptor negative status [ER-]: Secondary | ICD-10-CM | POA: Insufficient documentation

## 2020-01-11 DIAGNOSIS — Z5181 Encounter for therapeutic drug level monitoring: Secondary | ICD-10-CM | POA: Diagnosis not present

## 2020-01-11 DIAGNOSIS — Z79899 Other long term (current) drug therapy: Secondary | ICD-10-CM

## 2020-01-11 DIAGNOSIS — H409 Unspecified glaucoma: Secondary | ICD-10-CM | POA: Diagnosis not present

## 2020-01-11 DIAGNOSIS — Z95828 Presence of other vascular implants and grafts: Secondary | ICD-10-CM

## 2020-01-11 DIAGNOSIS — I427 Cardiomyopathy due to drug and external agent: Secondary | ICD-10-CM

## 2020-01-11 DIAGNOSIS — C50812 Malignant neoplasm of overlapping sites of left female breast: Secondary | ICD-10-CM | POA: Insufficient documentation

## 2020-01-11 DIAGNOSIS — Z452 Encounter for adjustment and management of vascular access device: Secondary | ICD-10-CM | POA: Diagnosis not present

## 2020-01-11 LAB — CBC WITH DIFFERENTIAL/PLATELET
Abs Immature Granulocytes: 0.02 10*3/uL (ref 0.00–0.07)
Basophils Absolute: 0 10*3/uL (ref 0.0–0.1)
Basophils Relative: 1 %
Eosinophils Absolute: 0.3 10*3/uL (ref 0.0–0.5)
Eosinophils Relative: 4 %
HCT: 43.3 % (ref 36.0–46.0)
Hemoglobin: 13.9 g/dL (ref 12.0–15.0)
Immature Granulocytes: 0 %
Lymphocytes Relative: 18 %
Lymphs Abs: 1.1 10*3/uL (ref 0.7–4.0)
MCH: 29.6 pg (ref 26.0–34.0)
MCHC: 32.1 g/dL (ref 30.0–36.0)
MCV: 92.3 fL (ref 80.0–100.0)
Monocytes Absolute: 0.3 10*3/uL (ref 0.1–1.0)
Monocytes Relative: 6 %
Neutro Abs: 4.3 10*3/uL (ref 1.7–7.7)
Neutrophils Relative %: 71 %
Platelets: 291 10*3/uL (ref 150–400)
RBC: 4.69 MIL/uL (ref 3.87–5.11)
RDW: 13.1 % (ref 11.5–15.5)
WBC: 6 10*3/uL (ref 4.0–10.5)
nRBC: 0 % (ref 0.0–0.2)

## 2020-01-11 LAB — COMPREHENSIVE METABOLIC PANEL
ALT: 18 U/L (ref 0–44)
AST: 24 U/L (ref 15–41)
Albumin: 4 g/dL (ref 3.5–5.0)
Alkaline Phosphatase: 60 U/L (ref 38–126)
Anion gap: 12 (ref 5–15)
BUN: 14 mg/dL (ref 8–23)
CO2: 26 mmol/L (ref 22–32)
Calcium: 8.6 mg/dL — ABNORMAL LOW (ref 8.9–10.3)
Chloride: 101 mmol/L (ref 98–111)
Creatinine, Ser: 0.82 mg/dL (ref 0.44–1.00)
GFR calc Af Amer: >60 mL/min (ref >60)
GFR calc non Af Amer: >60 mL/min (ref >60)
Glucose, Bld: 182 mg/dL — ABNORMAL HIGH (ref 70–99)
Potassium: 3.4 mmol/L — ABNORMAL LOW (ref 3.5–5.1)
Sodium: 139 mmol/L (ref 135–145)
Total Bilirubin: 0.6 mg/dL (ref 0.3–1.2)
Total Protein: 7 g/dL (ref 6.5–8.1)

## 2020-01-11 MED ORDER — HEPARIN SOD (PORK) LOCK FLUSH 100 UNIT/ML IV SOLN
500.0000 [IU] | Freq: Once | INTRAVENOUS | Status: AC
  Administered 2020-01-11: 500 [IU] via INTRAVENOUS
  Filled 2020-01-11: qty 5

## 2020-01-11 MED ORDER — SODIUM CHLORIDE 0.9% FLUSH
10.0000 mL | INTRAVENOUS | Status: DC | PRN
  Administered 2020-01-11: 10 mL via INTRAVENOUS
  Filled 2020-01-11: qty 10

## 2020-01-11 MED ORDER — HEPARIN SOD (PORK) LOCK FLUSH 100 UNIT/ML IV SOLN
INTRAVENOUS | Status: AC
  Filled 2020-01-11: qty 5

## 2020-01-11 MED ORDER — HEPARIN SOD (PORK) LOCK FLUSH 100 UNIT/ML IV SOLN
500.0000 [IU] | Freq: Once | INTRAVENOUS | Status: AC
  Filled 2020-01-11: qty 5

## 2020-01-11 MED ORDER — SODIUM CHLORIDE 0.9% FLUSH
10.0000 mL | Freq: Once | INTRAVENOUS | Status: AC
  Administered 2020-01-11: 10 mL via INTRAVENOUS
  Filled 2020-01-11: qty 10

## 2020-01-11 NOTE — Progress Notes (Signed)
[name] OFFICE PROGRESS NOTE  Patient Care Team: [name], [name], MD (General Surgery) Requested, [name]   Oncology History Overview Note  # June 2016- LEFT BREAST CA;  [name] ; [name] ; [name]; Her 2 Neu POS; [place] from July OF 2016; [name]; adjuvant Herceptin [ Finished July 2017]; AUG 2017- Neratinib x5 days; DISCON sec to diarrhea  # MID OCT 2018- Right breast mass-Bx- ER/PR-NEG; Her 2 NEU POSITIVE 1~2.5cm;  [?NEW primary]  # MID-OCT 2018-METASTATIC RECURRENT-oh sternal mass; Left Ax LN [Bx]/periportal LN  # OCT 25th 2018- TAXOL-HERCEPTIN-PERJETA; Jan 2019- CT PR; continue HP only; on HOLD since NOV 2019--sec to drop in EF/Sieziues  # July 15th 2020- RE-STARTED HP   -------------------------------------------------------------------  # MUGA scan- July 28th 2017- 67%.  December 2019-EF 53%; January 2020 EF-52%  # chronic gait/balance issues  # Seizures/petit-mal/ Dr.Shah-Keppra [name] [id number]]  # June 2017- left breast Bx- fat necrosis [[name]  # july 2017-  BRCA 1& 2- NEG.   MOLECULAR TESTING- F ONE- TPS- 0%;  ERB2 amplification; PI3K/RET amplification Others**  # PALLIATIVE CARE: P  --------------------------------------------------    DIAGNOSIS: [ OCT 9390]- REC/MET- BREAST CA ER/PR-NEG; her 2 POS  STAGE: 4  ;GOALS: Palliative  CURRENT/MOST RECENT THERAPY- Herceptin-Perjeta [C]    Carcinoma of overlapping sites of left breast in female, estrogen receptor negative ([name])     INTERVAL HISTORY: Patient a poor historian given dementia.  Patient accompanied by husband.  [name] 79 y.o.  female pleasant patient above history of metastatic breast cancer on Herceptin plus Perjeta-is here for follow-up/ review the results of MUGA scan.  Denies any  new worsening shortness of breath swelling of the legs.  However as per husband patient feels little more tired.    Otherwise chronic back pain joint pains.  No headaches.  No falls.  No new lumps or bumps.   Review of Systems  Unable to perform ROS: Dementia    PAST MEDICAL HISTORY :  Past Medical History:  Diagnosis Date  . Arthritis   . 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 Breast cancer ([place]) 2018   left breast; surgery 01/11/15 with Dr. Bary Castilla; path with invasive mammary and DCIS  . Breast cancer of upper-inner quadrant of left female breast ([place]) 12/15/14   Completed radiation end of December and finished chemotherapy 2 weeks ago, Left breast invasive mammary carcinoma, T1cN40mc (1.5 cm); Grade 3, [name], [name], [name], .  .Marland KitchenCataract    bilat   . DDD (degenerative disc disease), lumbar    Lumbar, previously evaluated by Dr. [place]arnestine Leys . Dementia ([place]   . Fibrocystic breast disease    prior biopsy  . Glaucoma   . 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 Hearing loss   . History of cancer chemotherapy   . History of radiation therapy   . Hyperlipidemia, unspecified   . Imbalance   . Memory impairment    seen by Dr SManuella Ghazi possible post crainiotomy from radiation  . 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  . Numbness and tingling    right hand   . Osteoarthritis   .  Osteoporosis, post-menopausal   . Personal history of chemotherapy   . Personal history of radiation therapy   . Shoulder pain, right   . Wears glasses     PAST SURGICAL HISTORY :   Past Surgical History:  Procedure Laterality Date  . APPENDECTOMY  1950  . BRAIN SURGERY  1995   left frontal/temporal  . BREAST BIOPSY Left 12/15/14   confirmed DCIS  . BREAST BIOPSY Left 01/08/2015   Procedure: BREAST BIOPSY WITH NEEDLE LOCALIZATION;  Surgeon: [name], MD;  Location:  [facility] ORS;  Service: General;  Laterality: Left;  . BREAST EXCISIONAL BIOPSY Left 1997  . BREAST LUMPECTOMY Left 01/08/2015   Procedure: LUMPECTOMY;  Surgeon: [name], MD;  Location: [facility] OR[facility];  Service: General;  Laterality: Left;  . COLONOSCOPY  2010   Dr. [name]  . ORIF ANKLE FRACTURE Right 08/05/2017   Procedure: OPEN REDUCTION INTERNAL FIXATION (ORIF) ANKLE FRACTURE;  Surgeon: Earnestine Leys, MD;  Location: [facility];  Service: Orthopedics;  Laterality: Right;  . PORTACATH PLACEMENT Right 01/16/2015   Procedure: INSERTION PORT-A-CATH;  Surgeon: [name], MD;  Location: [facility];  Service: General;  Laterality: Right;  . SENTINEL NODE BIOPSY Left 01/16/2015   Procedure: SENTINEL NODE BIOPSY;  Surgeon: [name], MD;  Location: [facility];  Service: General;  Laterality: Left;  . TOTAL HIP ARTHROPLASTY Right 09/04/2015   Procedure: RIGHT TOTAL HIP ARTHROPLASTY ANTERIOR APPROACH;  Surgeon: [name], MD;  Location: [facility];  Service: Orthopedics;  Laterality: Right;    FAMILY HISTORY :   Family History  Problem Relation Age of Onset  . Breast cancer Sister 97  . Addison's disease Mother   . Hyperthyroidism Mother   . Osteoarthritis Mother   . Stroke Father   . Heart disease Father   . High blood pressure Father     SOCIAL HISTORY:   Social History   Tobacco Use  . Smoking status: Former Smoker    Packs/day: 0.25    Years: 5.00    Pack years: 1.25    Types: Cigarettes    Quit date: 07/28/1972    Years since quitting: 47.4  . Smokeless tobacco: Never Used  Vaping Use  . Vaping Use: Never used  Substance Use Topics  . Alcohol use: Yes    Alcohol/week: 1.0 - 2.0 standard drink    Types: 1 - 2 Glasses of wine per week    Comment: 1 Glass Wine / Night  . Drug use: No    ALLERGIES:  is allergic to donepezil.  MEDICATIONS:  Current Outpatient Medications  Medication Sig Dispense Refill  . acetaminophen (TYLENOL) 325 MG tablet Take 650 mg by mouth every  4 (four) hours as needed. Pain / increased temp.    . ARTIFICIAL TEARS 1 % ophthalmic solution Place 1 drop into both eyes daily.    Marland Kitchen ascorbic acid (VITAMIN C) 500 MG tablet Take 1 tablet by mouth daily.    . Calcium Carbonate-Vitamin D (CALTRATE 600+D PO) Take 1 tablet by mouth daily.    . celecoxib (CELEBREX) 200 MG capsule Take 200 mg by mouth daily.     . Cetirizine HCl (ZYRTEC ALLERGY PO) Take 1 tablet by mouth as needed (allergies).    . chlorhexidine (PERIDEX) 0.12 % solution 15 mLs 2 (two) times daily.    Marland Kitchen levETIRAcetam (KEPPRA) 250 MG tablet 1 tablet daily at bedtime  5  . traMADol (ULTRAM) 50 MG tablet Take 1 tablet (50 mg total)  by mouth 2 (two) times daily as needed for moderate pain. 60 tablet 0  . vitamin E 400 UNIT capsule Take 400 Units by mouth daily.     . Travoprost, BAK Free, (TRAVATAN) 0.004 % SOLN ophthalmic solution travoprost 0.004 % eye drops     No current facility-administered medications for this visit.   Facility-Administered Medications Ordered in Other Visits  Medication Dose Route Frequency Provider Last Rate Last Admin  . sodium chloride flush (NS) 0.9 % injection 10 mL  10 mL Intravenous PRN Cammie Sickle, MD   10 mL at 07/07/18 0830  . sodium chloride flush (NS) 0.9 % injection 10 mL  10 mL Intravenous PRN Cammie Sickle, MD   10 mL at 01/11/20 0925    PHYSICAL EXAMINATION: ECOG PERFORMANCE STATUS: 2 - Symptomatic, <50% confined to bed  BP 116/64   Temp (!) 97.3 F (36.3 C) (Tympanic)   Ht 5' 5"  (1.651 m)   Wt 165 lb 9.6 oz (75.1 kg)   BMI 27.56 kg/m   Filed Weights   01/11/20 0832  Weight: 165 lb 9.6 oz (75.1 kg)     Physical Exam Constitutional:      Comments: Patient is accompanied by husband.  In a wheelchair.  HENT:     Head: Normocephalic and atraumatic.     Mouth/Throat:     Pharynx: No oropharyngeal exudate.  Eyes:     Pupils: Pupils are equal, round, and reactive to light.  Cardiovascular:     Rate and Rhythm:  Normal rate and regular rhythm.  Pulmonary:     Effort: Pulmonary effort is normal. No respiratory distress.     Breath sounds: Normal breath sounds. No wheezing.  Abdominal:     General: Bowel sounds are normal. There is no distension.     Palpations: Abdomen is soft. There is no mass.     Tenderness: There is no abdominal tenderness. There is no guarding or rebound.  Musculoskeletal:        General: No tenderness. Normal range of motion.     Cervical back: Normal range of motion and neck supple.  Skin:    General: Skin is warm.  Neurological:     Mental Status: She is alert and oriented to person, place, and time.  Psychiatric:        Mood and Affect: Affect normal.     LABORATORY DATA:  I have reviewed the data as listed    Component Value Date/Time   NA 139 01/11/2020 0822   K 3.4 (L) 01/11/2020 0822   CL 101 01/11/2020 0822   CO2 26 01/11/2020 0822   GLUCOSE 182 (H) 01/11/2020 0822   BUN 14 01/11/2020 0822   CREATININE 0.82 01/11/2020 0822   CALCIUM 8.6 (L) 01/11/2020 0822   PROT 7.0 01/11/2020 0822   ALBUMIN 4.0 01/11/2020 0822   AST 24 01/11/2020 0822   ALT 18 01/11/2020 0822   ALKPHOS 60 01/11/2020 0822   BILITOT 0.6 01/11/2020 0822   GFRNONAA >60 01/11/2020 0822   GFRAA >60 01/11/2020 0822    No results found for: SPEP, UPEP  Lab Results  Component Value Date   WBC 6.0 01/11/2020   NEUTROABS 4.3 01/11/2020   HGB 13.9 01/11/2020   HCT 43.3 01/11/2020   MCV 92.3 01/11/2020   PLT 291 01/11/2020      Chemistry      Component Value Date/Time   NA 139 01/11/2020 0822   K 3.4 (L) 01/11/2020 7408  CL 101 01/11/2020 0822   CO2 26 01/11/2020 0822   BUN 14 01/11/2020 0822   CREATININE 0.82 01/11/2020 0822      Component Value Date/Time   CALCIUM 8.6 (L) 01/11/2020 0822   ALKPHOS 60 01/11/2020 0822   AST 24 01/11/2020 0822   ALT 18 01/11/2020 0822   BILITOT 0.6 01/11/2020 0102       RADIOGRAPHIC STUDIES: I have personally reviewed the  radiological images as listed and agreed with the findings in the report. NM Cardiac [facility]  Result Date: 01/10/2020 CLINICAL DATA:  Breast cancer. Evaluate cardiac function in relation to chemotherapy. EXAM: NUCLEAR MEDICINE CARDIAC BLOOD POOL IMAGING (MUGA) TECHNIQUE: Cardiac multi-gated acquisition was performed at rest following intravenous injection of Tc-39mlabeled red blood cells. RADIOPHARMACEUTICALS:  Twenty-one point mCi Tc-945mertechnetate in-vitro labeled red blood cells IV COMPARISON:  MUGA scan 09/02/2019 FINDINGS: No  focal wall motion abnormality of the left ventricle. Calculated left ventricular ejection fraction equals 47.7 %. Ejection fraction compares to 51.2% on 09/02/2019 and 52.3% on 06/08/2019. IMPRESSION: Left ventricular ejection fraction equals 47.7 %. Electronically Signed   By: StSuzy Bouchard.D.   On: 01/10/2020 12:47     ASSESSMENT & PLAN:  Carcinoma of overlapping sites of left breast in female, estrogen receptor negative ([place]#Metastatic breast cancer ER [name] HER-2/neu positive. MAY 1st 2021-CT scan chest and pelvis NED- except stable RIGHT AXILLARY LN- 38m45mstable sclerosis of the left manubrium.  Right anterior pulmonary 2 to 4 mm nodule [see below] clinically STABLE.   # HOLD Herceptin Perjeta-given the drop in ejection fraction labs today reviewed; discussed with patient that given the overall stable recent CT scans from May 3; and the drop in ejection fraction I think it is okay to hold therapy/chemo holiday.  We will reevaluate with MUGA scan/CT scan in August 1 week.  # Drop in ejection fraction-FEB 2021- 51%; however June 14 MUGA scan ejection fraction 47%.  Hold treatment at this time/above  # Chronic back and hip pain/ gait instability-stable on asper patch/ tramadol prn.  DISPOSITION:  #HOLD Herceptin-Perjeta today # Follow up in 1st week of august;MD;labs-cbc/cmp;  Herceptin-Perjeta;CT scan C/A/P; MUGA scan prior- Dr.B    Orders  Placed This Encounter  Procedures  . CT Abdomen Pelvis W Contrast    Standing Status:   Future    Standing Expiration Date:   01/10/2021    Order Specific Question:   ** REASON FOR EXAM (FREE TEXT)    Answer:   metastatic breast cnacer    Order Specific Question:   If indicated for the ordered procedure, I authorize the administration of contrast media per Radiology protocol    Answer:   Yes    Order Specific Question:   Preferred imaging location?    Answer:   [facility]    Order Specific Question:   Is Oral Contrast requested for this exam?    Answer:   Yes, Per Radiology protocol    Order Specific Question:   Radiology Contrast Protocol - do NOT remove file path    Answer:   \\[email or web address].[email or web address]  . CT Chest W Contrast    Standing Status:   Future    Standing Expiration Date:   01/10/2021    Order Specific Question:   ** REASON FOR EXAM (FREE TEXT)    Answer:   metastatic breast cancer    Order Specific Question:   If indicated for the ordered procedure, I authorize the administration of contrast media  per Radiology protocol    Answer:   Yes    Order Specific Question:   Preferred imaging location?    Answer:   [facility]    Order Specific Question:   Radiology Contrast Protocol - do NOT remove file path    Answer:   \\[email or web address]  . NM Cardiac M[place] Rest    Standing Status:   Future    Standing Expiration Date:   01/10/2021    Order Specific Question:   caspofungin (CANCIDAS) frequency    Answer:   Q 24H    Order Specific Question:   Preferred imaging location?    Answer:   [facility]    Order Specific Question:   Radiology Contrast Protocol - do NOT remove file path    Answer:   \\[email or web address]    Order Specific Question:   ** REASON FOR EXAM (FREE TEXT)    Answer:   cardiotoxic chemo  . CBC with Differential    Standing Status:   Future    Standing Expiration Date:    01/10/2021  . Comprehensive metabolic panel    Standing Status:   Future    Standing Expiration Date:   01/10/2021   All questions were answered. The patient knows to call the clinic with any problems, questions or concerns.      Cammie Sickle, MD 01/11/2020 10:32 AM

## 2020-01-11 NOTE — Assessment & Plan Note (Addendum)
#  Metastatic breast cancer ER [name] HER-2/neu positive. MAY 1st 2021-CT scan chest and pelvis NED- except stable RIGHT AXILLARY LN- 62m; stable sclerosis of the left manubrium.  Right anterior pulmonary 2 to 4 mm nodule [see below] clinically STABLE.   # HOLD Herceptin Perjeta-given the drop in ejection fraction labs today reviewed; discussed with patient that given the overall stable recent CT scans from May 3; and the drop in ejection fraction I think it is okay to hold therapy/chemo holiday.  We will reevaluate with MUGA scan/CT scan in August 1 week.  # Drop in ejection fraction-FEB 2021- 51%; however June 14 MUGA scan ejection fraction 47%.  Hold treatment at this time/above  # Chronic back and hip pain/ gait instability-stable on asper patch/ tramadol prn.  DISPOSITION:  #HOLD Herceptin-Perjeta today # Follow up in 1st week of august;MD;labs-cbc/cmp;  Herceptin-Perjeta;CT scan C/A/P; MUGA scan prior- Dr.B

## 2020-02-16 ENCOUNTER — Ambulatory Visit
Admission: RE | Admit: 2020-02-16 | Discharge: 2020-02-16 | Disposition: A | Discharge status: Home / Self Care | Payer: Medicare Other | Source: Ambulatory Visit | Attending: General Surgery | Admitting: General Surgery

## 2020-02-16 DIAGNOSIS — Z853 Personal history of malignant neoplasm of breast: Secondary | ICD-10-CM

## 2020-03-01 ENCOUNTER — Ambulatory Visit
Admission: RE | Admit: 2020-03-01 | Discharge: 2020-03-01 | Disposition: A | Discharge status: Home / Self Care | Payer: Medicare Other | Source: Ambulatory Visit | Attending: Internal Medicine | Admitting: Internal Medicine

## 2020-03-01 ENCOUNTER — Other Ambulatory Visit: Payer: Self-pay

## 2020-03-01 DIAGNOSIS — C50812 Malignant neoplasm of overlapping sites of left female breast: Secondary | ICD-10-CM | POA: Insufficient documentation

## 2020-03-01 DIAGNOSIS — Z171 Estrogen receptor negative status [ER-]: Secondary | ICD-10-CM | POA: Insufficient documentation

## 2020-03-01 LAB — POCT I-STAT CREATININE: Creatinine, Ser: 0.9 mg/dL (ref 0.44–1.00)

## 2020-03-01 MED ORDER — IOHEXOL 300 MG/ML  SOLN
100.0000 mL | Freq: Once | INTRAMUSCULAR | Status: AC | PRN
  Administered 2020-03-01: 100 mL via INTRAVENOUS

## 2020-03-02 ENCOUNTER — Telehealth: Payer: Self-pay | Admitting: *Deleted

## 2020-03-02 ENCOUNTER — Encounter
Admission: RE | Admit: 2020-03-02 | Discharge: 2020-03-02 | Disposition: A | Discharge status: Home / Self Care | Payer: Medicare Other | Source: Ambulatory Visit | Attending: Internal Medicine | Admitting: Internal Medicine

## 2020-03-02 DIAGNOSIS — Z171 Estrogen receptor negative status [ER-]: Secondary | ICD-10-CM | POA: Diagnosis present

## 2020-03-02 DIAGNOSIS — I427 Cardiomyopathy due to drug and external agent: Secondary | ICD-10-CM

## 2020-03-02 DIAGNOSIS — C50812 Malignant neoplasm of overlapping sites of left female breast: Secondary | ICD-10-CM

## 2020-03-02 MED ORDER — TECHNETIUM TC 99M-LABELED RED BLOOD CELLS IV KIT
20.0000 | PACK | Freq: Once | INTRAVENOUS | Status: AC | PRN
  Administered 2020-03-02: 21.633 via INTRAVENOUS

## 2020-03-02 NOTE — Telephone Encounter (Signed)
Dr Tobe Sos called to say that [name] is having her MUGA scan today and they will see Dr B next week.

## 2020-03-05 ENCOUNTER — Other Ambulatory Visit: Payer: Self-pay

## 2020-03-05 ENCOUNTER — Inpatient Hospital Stay: Payer: Medicare Other | Attending: Internal Medicine

## 2020-03-05 ENCOUNTER — Encounter: Payer: Self-pay | Admitting: Internal Medicine

## 2020-03-05 ENCOUNTER — Inpatient Hospital Stay (HOSPITAL_BASED_OUTPATIENT_CLINIC_OR_DEPARTMENT_OTHER): Payer: Medicare Other | Admitting: Internal Medicine

## 2020-03-05 ENCOUNTER — Inpatient Hospital Stay: Payer: Medicare Other

## 2020-03-05 DIAGNOSIS — C50812 Malignant neoplasm of overlapping sites of left female breast: Secondary | ICD-10-CM

## 2020-03-05 DIAGNOSIS — Z171 Estrogen receptor negative status [ER-]: Secondary | ICD-10-CM | POA: Insufficient documentation

## 2020-03-05 DIAGNOSIS — M810 Age-related osteoporosis without current pathological fracture: Secondary | ICD-10-CM | POA: Diagnosis not present

## 2020-03-05 DIAGNOSIS — F0390 Unspecified dementia without behavioral disturbance: Secondary | ICD-10-CM | POA: Diagnosis not present

## 2020-03-05 DIAGNOSIS — G8929 Other chronic pain: Secondary | ICD-10-CM | POA: Diagnosis not present

## 2020-03-05 DIAGNOSIS — Z5112 Encounter for antineoplastic immunotherapy: Secondary | ICD-10-CM | POA: Diagnosis present

## 2020-03-05 DIAGNOSIS — M545 Low back pain: Secondary | ICD-10-CM | POA: Diagnosis not present

## 2020-03-05 DIAGNOSIS — M25559 Pain in unspecified hip: Secondary | ICD-10-CM | POA: Insufficient documentation

## 2020-03-05 LAB — CBC WITH DIFFERENTIAL/PLATELET
Abs Immature Granulocytes: 0.01 10*3/uL (ref 0.00–0.07)
Basophils Absolute: 0 10*3/uL (ref 0.0–0.1)
Basophils Relative: 1 %
Eosinophils Absolute: 0.2 10*3/uL (ref 0.0–0.5)
Eosinophils Relative: 4 %
HCT: 43.2 % (ref 36.0–46.0)
Hemoglobin: 14.1 g/dL (ref 12.0–15.0)
Immature Granulocytes: 0 %
Lymphocytes Relative: 16 %
Lymphs Abs: 0.9 10*3/uL (ref 0.7–4.0)
MCH: 30.2 pg (ref 26.0–34.0)
MCHC: 32.6 g/dL (ref 30.0–36.0)
MCV: 92.5 fL (ref 80.0–100.0)
Monocytes Absolute: 0.3 10*3/uL (ref 0.1–1.0)
Monocytes Relative: 6 %
Neutro Abs: 4.1 10*3/uL (ref 1.7–7.7)
Neutrophils Relative %: 73 %
Platelets: 282 10*3/uL (ref 150–400)
RBC: 4.67 MIL/uL (ref 3.87–5.11)
RDW: 13.2 % (ref 11.5–15.5)
WBC: 5.6 10*3/uL (ref 4.0–10.5)
nRBC: 0 % (ref 0.0–0.2)

## 2020-03-05 LAB — COMPREHENSIVE METABOLIC PANEL
ALT: 16 U/L (ref 0–44)
AST: 25 U/L (ref 15–41)
Albumin: 3.9 g/dL (ref 3.5–5.0)
Alkaline Phosphatase: 56 U/L (ref 38–126)
Anion gap: 11 (ref 5–15)
BUN: 16 mg/dL (ref 8–23)
CO2: 26 mmol/L (ref 22–32)
Calcium: 8.8 mg/dL — ABNORMAL LOW (ref 8.9–10.3)
Chloride: 100 mmol/L (ref 98–111)
Creatinine, Ser: 0.84 mg/dL (ref 0.44–1.00)
GFR calc Af Amer: >60 mL/min (ref >60)
GFR calc non Af Amer: >60 mL/min (ref >60)
Glucose, Bld: 190 mg/dL — ABNORMAL HIGH (ref 70–99)
Potassium: 3.6 mmol/L (ref 3.5–5.1)
Sodium: 137 mmol/L (ref 135–145)
Total Bilirubin: 0.5 mg/dL (ref 0.3–1.2)
Total Protein: 6.9 g/dL (ref 6.5–8.1)

## 2020-03-05 MED ORDER — HEPARIN SOD (PORK) LOCK FLUSH 100 UNIT/ML IV SOLN
500.0000 [IU] | Freq: Once | INTRAVENOUS | Status: DC | PRN
  Filled 2020-03-05: qty 5

## 2020-03-05 MED ORDER — SODIUM CHLORIDE 0.9 % IV SOLN
420.0000 mg | Freq: Once | INTRAVENOUS | Status: AC
  Administered 2020-03-05: 420 mg via INTRAVENOUS
  Filled 2020-03-05: qty 14

## 2020-03-05 MED ORDER — TRASTUZUMAB-DKST CHEMO 150 MG IV SOLR
450.0000 mg | Freq: Once | INTRAVENOUS | Status: AC
  Administered 2020-03-05: 450 mg via INTRAVENOUS
  Filled 2020-03-05: qty 21.43

## 2020-03-05 MED ORDER — ACETAMINOPHEN 325 MG PO TABS
650.0000 mg | ORAL_TABLET | Freq: Once | ORAL | Status: AC
  Administered 2020-03-05: 650 mg via ORAL
  Filled 2020-03-05: qty 2

## 2020-03-05 MED ORDER — SODIUM CHLORIDE 0.9% FLUSH
10.0000 mL | INTRAVENOUS | Status: DC | PRN
  Administered 2020-03-05: 10 mL via INTRAVENOUS
  Filled 2020-03-05: qty 10

## 2020-03-05 MED ORDER — HEPARIN SOD (PORK) LOCK FLUSH 100 UNIT/ML IV SOLN
INTRAVENOUS | Status: AC
  Filled 2020-03-05: qty 5

## 2020-03-05 MED ORDER — SODIUM CHLORIDE 0.9 % IV SOLN
Freq: Once | INTRAVENOUS | Status: AC
  Filled 2020-03-05: qty 250

## 2020-03-05 MED ORDER — HEPARIN SOD (PORK) LOCK FLUSH 100 UNIT/ML IV SOLN
500.0000 [IU] | Freq: Once | INTRAVENOUS | Status: AC
  Administered 2020-03-05: 500 [IU] via INTRAVENOUS
  Filled 2020-03-05: qty 5

## 2020-03-05 MED ORDER — DIPHENHYDRAMINE HCL 50 MG/ML IJ SOLN
12.5000 mg | Freq: Once | INTRAMUSCULAR | Status: AC
  Administered 2020-03-05: 12.5 mg via INTRAVENOUS
  Filled 2020-03-05: qty 1

## 2020-03-05 NOTE — Progress Notes (Signed)
[place] OFFICE PROGRESS NOTE  Patient Care Team: [name], [name], MD (General Surgery) Requested, [name]   Oncology History Overview Note  # June 2016- LEFT BREAST CA;  [name] ; [name] ; [name]; Her 2 Neu POS; [place] from July OF 2016; [name]; adjuvant Herceptin [ Finished July 2017]; AUG 2017- Neratinib x5 days; DISCON sec to diarrhea  # MID OCT 2018- Right breast mass-Bx- ER/PR-NEG; Her 2 NEU POSITIVE 1~2.5cm;  [?NEW primary]  # MID-OCT 2018-METASTATIC RECURRENT-oh sternal mass; Left Ax LN [Bx]/periportal LN  # OCT 25th 2018- TAXOL-HERCEPTIN-PERJETA; Jan 2019- CT PR; continue HP only; on HOLD since NOV 2019--sec to drop in EF/Sieziues  # July 15th 2020- RE-STARTED HP   -------------------------------------------------------------------  # MUGA scan- July 28th 2017- 67%.  December 2019-EF 53%; January 2020 EF-52%  # chronic gait/balance issues  # Seizures/petit-mal/ Dr.Shah-Keppra [name] [id number]]  # June 2017- left breast Bx- fat necrosis [[name]  # july 2017-  BRCA 1& 2- NEG.   MOLECULAR TESTING- F ONE- TPS- 0%;  ERB2 amplification; PI3K/RET amplification Others**  # PALLIATIVE CARE: P  --------------------------------------------------    DIAGNOSIS: [ OCT 5009]- REC/MET- BREAST CA ER/PR-NEG; her 2 POS  STAGE: 4  ;GOALS: Palliative  CURRENT/MOST RECENT THERAPY- Herceptin-Perjeta [C]    Carcinoma of overlapping sites of left breast in female, estrogen receptor negative ([place])     INTERVAL HISTORY: Patient a poor historian given dementia.  Patient accompanied by husband.  [name] 79 y.o.  female pleasant patient above history of metastatic breast cancer on Herceptin plus Perjeta-is here for follow-up/ review the results of MUGA scan/CT scan.  As  per the husband patient denies any worsening shortness of breath or cough.  Denies any new lumps or bumps.  No headaches.  Chronic low back pain.  No falls.    Review of Systems  Unable to perform ROS: Dementia    PAST MEDICAL HISTORY :  Past Medical History:  Diagnosis Date  . Arthritis   . 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 Breast cancer ([place]) 2018   left breast; surgery 01/11/15 with Dr. Bary Castilla; path with invasive mammary and DCIS  . Breast cancer of upper-inner quadrant of left female breast ([place]) 12/15/14   Completed radiation end of December and finished chemotherapy 2 weeks ago, Left breast invasive mammary carcinoma, T1cN36mc (1.5 cm); Grade 3, [name], [name], [name], .  .Marland KitchenCataract    bilat   . DDD (degenerative disc disease), lumbar    Lumbar, previously evaluated by Dr. HEarnestine Leys . Dementia ([place]   . Fibrocystic breast disease    prior biopsy  . Glaucoma   . 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 Hearing loss   . History of cancer chemotherapy   . History of radiation therapy   . Hyperlipidemia, unspecified   . Imbalance   . Memory impairment    seen by Dr SManuella Ghazi possible post crainiotomy from radiation  . [place]eningioma ([place]    [facility] cavern[facility]  . Numbness and tingling    right hand   . Osteoarthritis   . Osteoporosis, post-menopausal   . Personal history of  chemotherapy   . Personal history of radiation therapy   . Shoulder pain, right   . Wears glasses     PAST SURGICAL HISTORY :   Past Surgical History:  Procedure Laterality Date  . APPENDECTOMY  1950  . BRAIN SURGERY  1995   left frontal/temporal  . BREAST BIOPSY Left 12/15/14   confirmed DCIS  . BREAST BIOPSY Left 01/08/2015   Procedure: BREAST BIOPSY WITH NEEDLE LOCALIZATION;  Surgeon: [name], MD;  Location: [facility];  Service: General;  Laterality: Left;   . BREAST EXCISIONAL BIOPSY Left 1997  . BREAST LUMPECTOMY Left 01/08/2015   Procedure: LUMPECTOMY;  Surgeon: [name], MD;  Location: [facility];  Service: General;  Laterality: Left;  . COLONOSCOPY  2010   Dr. [name]  . ORIF ANKLE FRACTURE Right 08/05/2017   Procedure: OPEN REDUCTION INTERNAL FIXATION (ORIF) ANKLE FRACTURE;  Surgeon: Earnestine Leys, MD;  Location: [facility];  Service: Orthopedics;  Laterality: Right;  . PORTACATH PLACEMENT Right 01/16/2015   Procedure: INSERTION PORT-A-CATH;  Surgeon: [name], MD;  Location: [facility];  Service: General;  Laterality: Right;  . SENTINEL NODE BIOPSY Left 01/16/2015   Procedure: SENTINEL NODE BIOPSY;  Surgeon: [name], MD;  Location: [facility];  Service: General;  Laterality: Left;  . TOTAL HIP ARTHROPLASTY Right 09/04/2015   Procedure: RIGHT TOTAL HIP ARTHROPLASTY ANTERIOR APPROACH;  Surgeon: [name], MD;  Location: [facility];  Service: Orthopedics;  Laterality: Right;    FAMILY HISTORY :   Family History  Problem Relation Age of Onset  . Breast cancer Sister 74  . Addison's disease Mother   . Hyperthyroidism Mother   . Osteoarthritis Mother   . Stroke Father   . Heart disease Father   . High blood pressure Father     SOCIAL HISTORY:   Social History   Tobacco Use  . Smoking status: Former Smoker    Packs/day: 0.25    Years: 5.00    Pack years: 1.25    Types: Cigarettes    Quit date: 07/28/1972    Years since quitting: 47.6  . Smokeless tobacco: Never Used  Vaping Use  . Vaping Use: Never used  Substance Use Topics  . Alcohol use: Yes    Alcohol/week: 1.0 - 2.0 standard drink    Types: 1 - 2 Glasses of wine per week    Comment: 1 Glass Wine / Night  . Drug use: No    ALLERGIES:  is allergic to donepezil.  MEDICATIONS:  Current Outpatient Medications  Medication Sig Dispense Refill  . acetaminophen (TYLENOL) 325 MG tablet Take 650 mg by mouth every 4 (four) hours as needed. Pain / increased temp.     . ARTIFICIAL TEARS 1 % ophthalmic solution Place 1 drop into both eyes daily.    Marland Kitchen ascorbic acid (VITAMIN C) 500 MG tablet Take 1 tablet by mouth daily.    . Calcium Carbonate-Vitamin D (CALTRATE 600+D PO) Take 1 tablet by mouth daily.    . celecoxib (CELEBREX) 200 MG capsule Take 200 mg by mouth daily.     . Cetirizine HCl (ZYRTEC ALLERGY PO) Take 1 tablet by mouth as needed (allergies).    . chlorhexidine (PERIDEX) 0.12 % solution 15 mLs 2 (two) times daily.    Marland Kitchen levETIRAcetam (KEPPRA) 250 MG tablet 1 tablet daily at bedtime  5  . traMADol (ULTRAM) 50 MG tablet Take 1 tablet (50 mg total) by mouth 2 (two) times daily as needed  for moderate pain. 60 tablet 0  . Travoprost, BAK Free, (TRAVATAN) 0.004 % SOLN ophthalmic solution travoprost 0.004 % eye drops    . vitamin E 400 UNIT capsule Take 400 Units by mouth daily.      No current facility-administered medications for this visit.   Facility-Administered Medications Ordered in Other Visits  Medication Dose Route Frequency Provider Last Rate Last Admin  . heparin lock flush 100 unit/mL  500 Units Intravenous Once Charlaine Dalton R, MD      . sodium chloride flush (NS) 0.9 % injection 10 mL  10 mL Intravenous PRN Cammie Sickle, MD   10 mL at 07/07/18 0830  . sodium chloride flush (NS) 0.9 % injection 10 mL  10 mL Intravenous PRN Cammie Sickle, MD   10 mL at 03/05/20 0828    PHYSICAL EXAMINATION: ECOG PERFORMANCE STATUS: 2 - Symptomatic, <50% confined to bed  BP 116/62 (BP Location: Left Arm, Patient Position: Sitting, Cuff Size: Large)   Pulse 73   Temp 97.9 F (36.6 C) (Tympanic)   Resp 16   Ht 5' 5" (1.651 m)   Wt 168 lb (76.2 kg)   SpO2 95%   BMI 27.96 kg/m   Filed Weights   03/05/20 0848  Weight: 168 lb (76.2 kg)     Physical Exam Constitutional:      Comments: Patient is accompanied by husband.  In a wheelchair.  HENT:     Head: Normocephalic and atraumatic.     Mouth/Throat:     Pharynx: No  oropharyngeal exudate.  Eyes:     Pupils: Pupils are equal, round, and reactive to light.  Cardiovascular:     Rate and Rhythm: Normal rate and regular rhythm.  Pulmonary:     Effort: Pulmonary effort is normal. No respiratory distress.     Breath sounds: Normal breath sounds. No wheezing.  Abdominal:     General: Bowel sounds are normal. There is no distension.     Palpations: Abdomen is soft. There is no mass.     Tenderness: There is no abdominal tenderness. There is no guarding or rebound.  Musculoskeletal:        General: No tenderness. Normal range of motion.     Cervical back: Normal range of motion and neck supple.  Skin:    General: Skin is warm.  Neurological:     Mental Status: She is alert and oriented to person, place, and time.  Psychiatric:        Mood and Affect: Affect normal.     LABORATORY DATA:  I have reviewed the data as listed    Component Value Date/Time   NA 137 03/05/2020 0828   K 3.6 03/05/2020 0828   CL 100 03/05/2020 0828   CO2 26 03/05/2020 0828   GLUCOSE 190 (H) 03/05/2020 0828   BUN 16 03/05/2020 0828   CREATININE 0.84 03/05/2020 0828   CALCIUM 8.8 (L) 03/05/2020 0828   PROT 6.9 03/05/2020 0828   ALBUMIN 3.9 03/05/2020 0828   AST 25 03/05/2020 0828   ALT 16 03/05/2020 0828   ALKPHOS 56 03/05/2020 0828   BILITOT 0.5 03/05/2020 0828   GFRNONAA >60 03/05/2020 0828   GFRAA >60 03/05/2020 0828    No results found for: SPEP, UPEP  Lab Results  Component Value Date   WBC 5.6 03/05/2020   NEUTROABS 4.1 03/05/2020   HGB 14.1 03/05/2020   HCT 43.2 03/05/2020   MCV 92.5 03/05/2020   PLT 282 03/05/2020  Chemistry      Component Value Date/Time   NA 137 03/05/2020 0828   K 3.6 03/05/2020 0828   CL 100 03/05/2020 0828   CO2 26 03/05/2020 0828   BUN 16 03/05/2020 0828   CREATININE 0.84 03/05/2020 0828      Component Value Date/Time   CALCIUM 8.8 (L) 03/05/2020 0828   ALKPHOS 56 03/05/2020 0828   AST 25 03/05/2020 0828   ALT  16 03/05/2020 0828   BILITOT 0.5 03/05/2020 0828       RADIOGRAPHIC STUDIES: I have personally reviewed the radiological images as listed and agreed with the findings in the report. No results found.   ASSESSMENT & PLAN:  Carcinoma of overlapping sites of left breast in female, estrogen receptor negative ([place]) #Metastatic breast cancer ER [name] HER-2/neu positive. AUG 5th 2021- CT scan chest and pelvis NED- STABLE;  stable sclerosis of the left manubrium.   # Proceed with Herceptin Perjeta- Labs today reviewed;  acceptable for treatment today. MUGA scan- 61%.   # Drop in ejection fraction-June 14 MUGA scan ejection fraction 47%. Proceed with herceptin-perjeta today.   # Chronic back and hip pain/ gait instability-STABLE:  on asper patch/ tramadol prn.  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  Asymptomatic.  DISPOSITION:  #Herceptin-Perjeta today # Follow up: in 3 weeks- MD; labs- labs-cbc/cmp;  Herceptin-Perjeta; Dr.B    No orders of the defined types were placed in this encounter.  All questions were answered. The patient knows to call the clinic with any problems, questions or concerns.      Cammie Sickle, MD 03/05/2020 9:14 AM

## 2020-03-05 NOTE — Assessment & Plan Note (Addendum)
#  Metastatic breast cancer ER [name] HER-2/neu positive. AUG 5th 2021- CT scan chest and pelvis NED- STABLE;  stable sclerosis of the left manubrium.   # Proceed with Herceptin Perjeta- Labs today reviewed;  acceptable for treatment today. MUGA scan- 61%.   # Drop in ejection fraction-June 14 MUGA scan ejection fraction 47%. Proceed with herceptin-perjeta today.   # Chronic back and hip pain/ gait instability-STABLE:  on asper patch/ tramadol prn.  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  Asymptomatic.  DISPOSITION:  #Herceptin-Perjeta today # Follow up: in 3 weeks- MD; labs- labs-cbc/cmp;  Herceptin-Perjeta; Dr.B

## 2020-03-10 ENCOUNTER — Encounter: Payer: Self-pay | Admitting: Internal Medicine

## 2020-03-26 ENCOUNTER — Inpatient Hospital Stay: Payer: Medicare Other

## 2020-03-26 ENCOUNTER — Encounter: Payer: Self-pay | Admitting: Internal Medicine

## 2020-03-26 ENCOUNTER — Other Ambulatory Visit: Payer: Self-pay

## 2020-03-26 ENCOUNTER — Inpatient Hospital Stay (HOSPITAL_BASED_OUTPATIENT_CLINIC_OR_DEPARTMENT_OTHER): Payer: Medicare Other | Admitting: Internal Medicine

## 2020-03-26 DIAGNOSIS — Z5112 Encounter for antineoplastic immunotherapy: Secondary | ICD-10-CM | POA: Diagnosis not present

## 2020-03-26 DIAGNOSIS — Z171 Estrogen receptor negative status [ER-]: Secondary | ICD-10-CM | POA: Diagnosis not present

## 2020-03-26 DIAGNOSIS — Z95828 Presence of other vascular implants and grafts: Secondary | ICD-10-CM

## 2020-03-26 DIAGNOSIS — C50812 Malignant neoplasm of overlapping sites of left female breast: Secondary | ICD-10-CM | POA: Diagnosis not present

## 2020-03-26 LAB — CBC WITH DIFFERENTIAL/PLATELET
Abs Immature Granulocytes: 0.02 10*3/uL (ref 0.00–0.07)
Basophils Absolute: 0 10*3/uL (ref 0.0–0.1)
Basophils Relative: 1 %
Eosinophils Absolute: 0.2 10*3/uL (ref 0.0–0.5)
Eosinophils Relative: 4 %
HCT: 42.5 % (ref 36.0–46.0)
Hemoglobin: 13.8 g/dL (ref 12.0–15.0)
Immature Granulocytes: 0 %
Lymphocytes Relative: 17 %
Lymphs Abs: 1 10*3/uL (ref 0.7–4.0)
MCH: 30 pg (ref 26.0–34.0)
MCHC: 32.5 g/dL (ref 30.0–36.0)
MCV: 92.4 fL (ref 80.0–100.0)
Monocytes Absolute: 0.4 10*3/uL (ref 0.1–1.0)
Monocytes Relative: 7 %
Neutro Abs: 4 10*3/uL (ref 1.7–7.7)
Neutrophils Relative %: 71 %
Platelets: 292 10*3/uL (ref 150–400)
RBC: 4.6 MIL/uL (ref 3.87–5.11)
RDW: 13.4 % (ref 11.5–15.5)
WBC: 5.6 10*3/uL (ref 4.0–10.5)
nRBC: 0 % (ref 0.0–0.2)

## 2020-03-26 LAB — COMPREHENSIVE METABOLIC PANEL
ALT: 18 U/L (ref 0–44)
AST: 24 U/L (ref 15–41)
Albumin: 4.1 g/dL (ref 3.5–5.0)
Alkaline Phosphatase: 52 U/L (ref 38–126)
Anion gap: 10 (ref 5–15)
BUN: 18 mg/dL (ref 8–23)
CO2: 27 mmol/L (ref 22–32)
Calcium: 8.6 mg/dL — ABNORMAL LOW (ref 8.9–10.3)
Chloride: 101 mmol/L (ref 98–111)
Creatinine, Ser: 0.78 mg/dL (ref 0.44–1.00)
GFR calc Af Amer: >60 mL/min (ref >60)
GFR calc non Af Amer: >60 mL/min (ref >60)
Glucose, Bld: 189 mg/dL — ABNORMAL HIGH (ref 70–99)
Potassium: 3.7 mmol/L (ref 3.5–5.1)
Sodium: 138 mmol/L (ref 135–145)
Total Bilirubin: 0.5 mg/dL (ref 0.3–1.2)
Total Protein: 7 g/dL (ref 6.5–8.1)

## 2020-03-26 MED ORDER — DIPHENHYDRAMINE HCL 50 MG/ML IJ SOLN
12.5000 mg | Freq: Once | INTRAMUSCULAR | Status: AC
  Administered 2020-03-26: 12.5 mg via INTRAVENOUS
  Filled 2020-03-26: qty 1

## 2020-03-26 MED ORDER — SODIUM CHLORIDE 0.9 % IV SOLN
Freq: Once | INTRAVENOUS | Status: AC
  Filled 2020-03-26: qty 250

## 2020-03-26 MED ORDER — SODIUM CHLORIDE 0.9 % IV SOLN
420.0000 mg | Freq: Once | INTRAVENOUS | Status: AC
  Administered 2020-03-26: 420 mg via INTRAVENOUS
  Filled 2020-03-26: qty 14

## 2020-03-26 MED ORDER — HEPARIN SOD (PORK) LOCK FLUSH 100 UNIT/ML IV SOLN
500.0000 [IU] | Freq: Once | INTRAVENOUS | Status: AC
  Administered 2020-03-26: 500 [IU] via INTRAVENOUS
  Filled 2020-03-26: qty 5

## 2020-03-26 MED ORDER — TRASTUZUMAB-DKST CHEMO 150 MG IV SOLR
450.0000 mg | Freq: Once | INTRAVENOUS | Status: AC
  Administered 2020-03-26: 450 mg via INTRAVENOUS
  Filled 2020-03-26: qty 21.43

## 2020-03-26 MED ORDER — HEPARIN SOD (PORK) LOCK FLUSH 100 UNIT/ML IV SOLN
INTRAVENOUS | Status: AC
  Filled 2020-03-26: qty 5

## 2020-03-26 MED ORDER — ACETAMINOPHEN 325 MG PO TABS
650.0000 mg | ORAL_TABLET | Freq: Once | ORAL | Status: AC
  Administered 2020-03-26: 650 mg via ORAL
  Filled 2020-03-26: qty 2

## 2020-03-26 MED ORDER — SODIUM CHLORIDE 0.9% FLUSH
10.0000 mL | Freq: Once | INTRAVENOUS | Status: AC
  Administered 2020-03-26: 10 mL via INTRAVENOUS
  Filled 2020-03-26: qty 10

## 2020-03-26 NOTE — Assessment & Plan Note (Addendum)
#  Metastatic breast cancer ER [name] HER-2/neu positive. AUG 5th 2021- CT scan chest and pelvis NED- STABLE;  stable sclerosis of the left manubrium. STABLE.   # Proceed with Herceptin Perjeta- Labs today reviewed;  acceptable for treatment today.   # Drop in ejection fraction-June 14 MUGA scan ejection fraction 47%; AUG 2021- MUGA scan- 61%. Stable.   # Chronic back and hip pain/ gait instability-STABLE;  on asper patch/ tramadol prn.    # COVID BOOSTER: Discussed given patient's diagnosis and other comorbidities/therapies-patient would be considered immunocompromised.  As per CDC recommendation/FDA approval-I would recommend booster vaccine.  Patient/husband is interested.    DISPOSITION:  #Herceptin-Perjeta today # Follow up: in 3 weeks- MD; labs- labs-cbc/cmp;  Herceptin-Perjeta; Dr.B

## 2020-03-26 NOTE — Progress Notes (Signed)
[place] OFFICE PROGRESS NOTE  Patient Care Team: [name], [name], MD (General Surgery) Requested, [name]   Oncology History Overview Note  # June 2016- LEFT BREAST CA;  [name] ; [name] ; [name]; Her 2 Neu POS; [place] from July OF 2016; [name]; adjuvant Herceptin [ Finished July 2017]; AUG 2017- Neratinib x5 days; DISCON sec to diarrhea  # MID OCT 2018- Right breast mass-Bx- ER/PR-NEG; Her 2 NEU POSITIVE 1~2.5cm;  [?NEW primary]  # MID-OCT 2018-METASTATIC RECURRENT-oh sternal mass; Left Ax LN [Bx]/periportal LN  # OCT 25th 2018- TAXOL-HERCEPTIN-PERJETA; Jan 2019- CT PR; continue HP only; on HOLD since NOV 2019--sec to drop in EF/Sieziues  # July 15th 2020- RE-STARTED HP   -------------------------------------------------------------------  # MUGA scan- July 28th 2017- 67%.  December 2019-EF 53%; January 2020 EF-52%  # chronic gait/balance issues  # Seizures/petit-mal/ Dr.Shah-Keppra [name] [id number]]  # June 2017- left breast Bx- fat necrosis [[name]  # july 2017-  BRCA 1& 2- NEG.   MOLECULAR TESTING- F ONE- TPS- 0%;  ERB2 amplification; PI3K/RET amplification Others**  # PALLIATIVE CARE: P  --------------------------------------------------    DIAGNOSIS: [ OCT 6295]- REC/MET- BREAST CA ER/PR-NEG; her 2 POS  STAGE: 4  ;GOALS: Palliative  CURRENT/MOST RECENT THERAPY- Herceptin-Perjeta [C]    Carcinoma of overlapping sites of left breast in female, estrogen receptor negative ([place])     INTERVAL HISTORY: Patient a poor historian given dementia.  Patient accompanied by husband.  [name] 79 y.o.  female pleasant patient above history of metastatic breast cancer on Herceptin plus Perjeta-is here for follow-up.  Patient denies any shortness of breath or  cough.  No new lumps or bumps.  No headaches.  Continues to have chronic low back pain/gait instability.  No falls. .    Review of Systems  Unable to perform ROS: Dementia    PAST MEDICAL HISTORY :  Past Medical History:  Diagnosis Date  . Arthritis   . 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 Breast cancer ([place]) 2018   left breast; surgery 01/11/15 with Dr. Bary Castilla; path with invasive mammary and DCIS  . Breast cancer of upper-inner quadrant of left female breast ([place]) 12/15/14   Completed radiation end of December and finished chemotherapy 2 weeks ago, Left breast invasive mammary carcinoma, T1cN31mc (1.5 cm); Grade 3, [name], [name], [name], .  .Marland KitchenCataract    bilat   . DDD (degenerative disc disease), lumbar    Lumbar, previously evaluated by Dr. HEarnestine Leys . Dementia ([place]   . Fibrocystic breast disease    prior biopsy  . Glaucoma   . 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 Hearing loss   . History of cancer chemotherapy   . History of radiation therapy   . Hyperlipidemia, unspecified   . Imbalance   . Memory impairment    seen by Dr SManuella Ghazi possible post crainiotomy from radiation  . [place] ([place]    Left cavern[facility]  . Numbness and tingling    right hand   . Osteoarthritis   . Osteoporosis, post-menopausal   . Personal history of chemotherapy   . Personal history of radiation  therapy   . Shoulder pain, right   . Wears glasses     PAST SURGICAL HISTORY :   Past Surgical History:  Procedure Laterality Date  . APPENDECTOMY  1950  . BRAIN SURGERY  1995   left frontal/temporal  . BREAST BIOPSY Left 12/15/14   confirmed DCIS  . BREAST BIOPSY Left 01/08/2015   Procedure: BREAST BIOPSY WITH NEEDLE LOCALIZATION;  Surgeon: [name], MD;  Location: [facility];  Service: General;  Laterality: Left;  . BREAST EXCISIONAL BIOPSY Left 1997  .  BREAST LUMPECTOMY Left 01/08/2015   Procedure: LUMPECTOMY;  Surgeon: [name], MD;  Location: [facility];  Service: General;  Laterality: Left;  . COLONOSCOPY  2010   Dr. [name]  . ORIF ANKLE FRACTURE Right 08/05/2017   Procedure: OPEN REDUCTION INTERNAL FIXATION (ORIF) ANKLE FRACTURE;  Surgeon: Earnestine Leys, MD;  Location: [facility];  Service: Orthopedics;  Laterality: Right;  . PORTACATH PLACEMENT Right 01/16/2015   Procedure: INSERTION PORT-A-CATH;  Surgeon: [name], MD;  Location: [facility];  Service: General;  Laterality: Right;  . SENTINEL NODE BIOPSY Left 01/16/2015   Procedure: SENTINEL NODE BIOPSY;  Surgeon: [name], MD;  Location: [facility];  Service: General;  Laterality: Left;  . TOTAL HIP ARTHROPLASTY Right 09/04/2015   Procedure: RIGHT TOTAL HIP ARTHROPLASTY ANTERIOR APPROACH;  Surgeon: [name], MD;  Location: [facility];  Service: Orthopedics;  Laterality: Right;    FAMILY HISTORY :   Family History  Problem Relation Age of Onset  . Breast cancer Sister 58  . Addison's disease Mother   . Hyperthyroidism Mother   . Osteoarthritis Mother   . Stroke Father   . Heart disease Father   . High blood pressure Father     SOCIAL HISTORY:   Social History   Tobacco Use  . Smoking status: Former Smoker    Packs/day: 0.25    Years: 5.00    Pack years: 1.25    Types: Cigarettes    Quit date: 07/28/1972    Years since quitting: 47.6  . Smokeless tobacco: Never Used  Vaping Use  . Vaping Use: Never used  Substance Use Topics  . Alcohol use: Yes    Alcohol/week: 1.0 - 2.0 standard drink    Types: 1 - 2 Glasses of wine per week    Comment: 1 Glass Wine / Night  . Drug use: No    ALLERGIES:  is allergic to donepezil.  MEDICATIONS:  Current Outpatient Medications  Medication Sig Dispense Refill  . acetaminophen (TYLENOL) 325 MG tablet Take 650 mg by mouth every 4 (four) hours as needed. Pain / increased temp.    . ARTIFICIAL TEARS 1 % ophthalmic  solution Place 1 drop into both eyes daily.    Marland Kitchen ascorbic acid (VITAMIN C) 500 MG tablet Take 1 tablet by mouth daily.    . Calcium Carbonate-Vitamin D (CALTRATE 600+D PO) Take 1 tablet by mouth daily.    . celecoxib (CELEBREX) 200 MG capsule Take 200 mg by mouth daily.     . Cetirizine HCl (ZYRTEC ALLERGY PO) Take 1 tablet by mouth as needed (allergies).    . chlorhexidine (PERIDEX) 0.12 % solution 15 mLs 2 (two) times daily.    Marland Kitchen levETIRAcetam (KEPPRA) 250 MG tablet 1 tablet daily at bedtime  5  . traMADol (ULTRAM) 50 MG tablet Take 1 tablet (50 mg total) by mouth 2 (two) times daily as needed for moderate pain. 60 tablet 0  .  Travoprost, BAK Free, (TRAVATAN) 0.004 % SOLN ophthalmic solution travoprost 0.004 % eye drops    . vitamin E 400 UNIT capsule Take 400 Units by mouth daily.      No current facility-administered medications for this visit.   Facility-Administered Medications Ordered in Other Visits  Medication Dose Route Frequency Provider Last Rate Last Admin  . heparin lock flush 100 unit/mL  500 Units Intravenous Once Cammie Sickle, MD      . pertuzumab (PERJETA) 420 mg in sodium chloride 0.9 % 250 mL chemo infusion  420 mg Intravenous Once Charlaine Dalton R, MD      . sodium chloride flush (NS) 0.9 % injection 10 mL  10 mL Intravenous PRN Cammie Sickle, MD   10 mL at 07/07/18 0830  . trastuzumab-dkst (OGIVRI) 450 mg in sodium chloride 0.9 % 250 mL chemo infusion  450 mg Intravenous Once Cammie Sickle, MD        PHYSICAL EXAMINATION: ECOG PERFORMANCE STATUS: 2 - Symptomatic, <50% confined to bed  BP 118/62 (BP Location: Left Arm, Patient Position: Sitting, Cuff Size: Large)   Pulse 74   Temp 98.1 F (36.7 C) (Tympanic)   Resp 16   Ht 5' 5"  (1.651 m)   Wt 169 lb (76.7 kg)   SpO2 96%   BMI 28.12 kg/m   Filed Weights   03/26/20 0840  Weight: 169 lb (76.7 kg)     Physical Exam Constitutional:      Comments: Patient is accompanied by  husband.  In a wheelchair.  HENT:     Head: Normocephalic and atraumatic.     Mouth/Throat:     Pharynx: No oropharyngeal exudate.  Eyes:     Pupils: Pupils are equal, round, and reactive to light.  Cardiovascular:     Rate and Rhythm: Normal rate and regular rhythm.  Pulmonary:     Effort: Pulmonary effort is normal. No respiratory distress.     Breath sounds: Normal breath sounds. No wheezing.  Abdominal:     General: Bowel sounds are normal. There is no distension.     Palpations: Abdomen is soft. There is no mass.     Tenderness: There is no abdominal tenderness. There is no guarding or rebound.  Musculoskeletal:        General: No tenderness. Normal range of motion.     Cervical back: Normal range of motion and neck supple.  Skin:    General: Skin is warm.  Neurological:     Mental Status: She is alert and oriented to person, place, and time.  Psychiatric:        Mood and Affect: Affect normal.     LABORATORY DATA:  I have reviewed the data as listed    Component Value Date/Time   NA 138 03/26/2020 0833   K 3.7 03/26/2020 0833   CL 101 03/26/2020 0833   CO2 27 03/26/2020 0833   GLUCOSE 189 (H) 03/26/2020 0833   BUN 18 03/26/2020 0833   CREATININE 0.78 03/26/2020 0833   CALCIUM 8.6 (L) 03/26/2020 0833   PROT 7.0 03/26/2020 0833   ALBUMIN 4.1 03/26/2020 0833   AST 24 03/26/2020 0833   ALT 18 03/26/2020 0833   ALKPHOS 52 03/26/2020 0833   BILITOT 0.5 03/26/2020 0833   GFRNONAA >60 03/26/2020 0833   GFRAA >60 03/26/2020 0833    No results found for: SPEP, UPEP  Lab Results  Component Value Date   WBC 5.6 03/26/2020   NEUTROABS 4.0 03/26/2020  HGB 13.8 03/26/2020   HCT 42.5 03/26/2020   MCV 92.4 03/26/2020   PLT 292 03/26/2020      Chemistry      Component Value Date/Time   NA 138 03/26/2020 0833   K 3.7 03/26/2020 0833   CL 101 03/26/2020 0833   CO2 27 03/26/2020 0833   BUN 18 03/26/2020 0833   CREATININE 0.78 03/26/2020 0833      Component  Value Date/Time   CALCIUM 8.6 (L) 03/26/2020 0833   ALKPHOS 52 03/26/2020 0833   AST 24 03/26/2020 0833   ALT 18 03/26/2020 0833   BILITOT 0.5 03/26/2020 0833       RADIOGRAPHIC STUDIES: I have personally reviewed the radiological images as listed and agreed with the findings in the report. No results found.   ASSESSMENT & PLAN:  Carcinoma of overlapping sites of left breast in female, estrogen receptor negative ([place]) #Metastatic breast cancer ER [name] HER-2/neu positive. AUG 5th 2021- CT scan chest and pelvis NED- STABLE;  stable sclerosis of the left manubrium. STABLE.   # Proceed with Herceptin Perjeta- Labs today reviewed;  acceptable for treatment today.   # Drop in ejection fraction-June 14 MUGA scan ejection fraction 47%; AUG 2021- MUGA scan- 61%. Stable.   # Chronic back and hip pain/ gait instability-STABLE;  on asper patch/ tramadol prn.    # COVID BOOSTER: Discussed given patient's diagnosis and other comorbidities/therapies-patient would be considered immunocompromised.  As per CDC recommendation/FDA approval-I would recommend booster vaccine.  Patient/husband is interested.    DISPOSITION:  #Herceptin-Perjeta today # Follow up: in 3 weeks- MD; labs- labs-cbc/cmp;  Herceptin-Perjeta; Dr.B    No orders of the defined types were placed in this encounter.  All questions were answered. The patient knows to call the clinic with any problems, questions or concerns.      Cammie Sickle, MD 03/26/2020 9:40 AM

## 2020-04-16 ENCOUNTER — Inpatient Hospital Stay (HOSPITAL_BASED_OUTPATIENT_CLINIC_OR_DEPARTMENT_OTHER): Payer: Medicare Other | Admitting: Internal Medicine

## 2020-04-16 ENCOUNTER — Other Ambulatory Visit: Payer: Self-pay

## 2020-04-16 ENCOUNTER — Encounter: Payer: Self-pay | Admitting: Internal Medicine

## 2020-04-16 ENCOUNTER — Inpatient Hospital Stay: Payer: Medicare Other

## 2020-04-16 ENCOUNTER — Inpatient Hospital Stay: Payer: Medicare Other | Attending: Internal Medicine

## 2020-04-16 VITALS — BP 123/74 | HR 65 | Resp 18

## 2020-04-16 DIAGNOSIS — Z5112 Encounter for antineoplastic immunotherapy: Secondary | ICD-10-CM | POA: Insufficient documentation

## 2020-04-16 DIAGNOSIS — Z171 Estrogen receptor negative status [ER-]: Secondary | ICD-10-CM | POA: Diagnosis not present

## 2020-04-16 DIAGNOSIS — C50812 Malignant neoplasm of overlapping sites of left female breast: Secondary | ICD-10-CM | POA: Diagnosis present

## 2020-04-16 DIAGNOSIS — M25559 Pain in unspecified hip: Secondary | ICD-10-CM | POA: Insufficient documentation

## 2020-04-16 LAB — CBC WITH DIFFERENTIAL/PLATELET
Abs Immature Granulocytes: 0.02 10*3/uL (ref 0.00–0.07)
Basophils Absolute: 0 10*3/uL (ref 0.0–0.1)
Basophils Relative: 0 %
Eosinophils Absolute: 0.2 10*3/uL (ref 0.0–0.5)
Eosinophils Relative: 3 %
HCT: 41.9 % (ref 36.0–46.0)
Hemoglobin: 13.8 g/dL (ref 12.0–15.0)
Immature Granulocytes: 0 %
Lymphocytes Relative: 16 %
Lymphs Abs: 1 10*3/uL (ref 0.7–4.0)
MCH: 30.3 pg (ref 26.0–34.0)
MCHC: 32.9 g/dL (ref 30.0–36.0)
MCV: 92.1 fL (ref 80.0–100.0)
Monocytes Absolute: 0.4 10*3/uL (ref 0.1–1.0)
Monocytes Relative: 6 %
Neutro Abs: 4.8 10*3/uL (ref 1.7–7.7)
Neutrophils Relative %: 75 %
Platelets: 292 10*3/uL (ref 150–400)
RBC: 4.55 MIL/uL (ref 3.87–5.11)
RDW: 13.2 % (ref 11.5–15.5)
WBC: 6.4 10*3/uL (ref 4.0–10.5)
nRBC: 0 % (ref 0.0–0.2)

## 2020-04-16 LAB — COMPREHENSIVE METABOLIC PANEL
ALT: 17 U/L (ref 0–44)
AST: 24 U/L (ref 15–41)
Albumin: 4 g/dL (ref 3.5–5.0)
Alkaline Phosphatase: 51 U/L (ref 38–126)
Anion gap: 12 (ref 5–15)
BUN: 17 mg/dL (ref 8–23)
CO2: 25 mmol/L (ref 22–32)
Calcium: 8.4 mg/dL — ABNORMAL LOW (ref 8.9–10.3)
Chloride: 100 mmol/L (ref 98–111)
Creatinine, Ser: 0.78 mg/dL (ref 0.44–1.00)
GFR calc Af Amer: >60 mL/min (ref >60)
GFR calc non Af Amer: >60 mL/min (ref >60)
Glucose, Bld: 169 mg/dL — ABNORMAL HIGH (ref 70–99)
Potassium: 3.8 mmol/L (ref 3.5–5.1)
Sodium: 137 mmol/L (ref 135–145)
Total Bilirubin: 0.6 mg/dL (ref 0.3–1.2)
Total Protein: 7 g/dL (ref 6.5–8.1)

## 2020-04-16 MED ORDER — SODIUM CHLORIDE 0.9 % IV SOLN
Freq: Once | INTRAVENOUS | Status: AC
  Filled 2020-04-16: qty 250

## 2020-04-16 MED ORDER — DIPHENHYDRAMINE HCL 50 MG/ML IJ SOLN
12.5000 mg | Freq: Once | INTRAMUSCULAR | Status: AC
  Administered 2020-04-16: 12.5 mg via INTRAVENOUS
  Filled 2020-04-16: qty 1

## 2020-04-16 MED ORDER — HEPARIN SOD (PORK) LOCK FLUSH 100 UNIT/ML IV SOLN
500.0000 [IU] | Freq: Once | INTRAVENOUS | Status: DC
  Filled 2020-04-16: qty 5

## 2020-04-16 MED ORDER — SODIUM CHLORIDE 0.9 % IV SOLN
420.0000 mg | Freq: Once | INTRAVENOUS | Status: AC
  Administered 2020-04-16: 420 mg via INTRAVENOUS
  Filled 2020-04-16: qty 14

## 2020-04-16 MED ORDER — HEPARIN SOD (PORK) LOCK FLUSH 100 UNIT/ML IV SOLN
500.0000 [IU] | Freq: Once | INTRAVENOUS | Status: AC | PRN
  Administered 2020-04-16: 500 [IU]
  Filled 2020-04-16: qty 5

## 2020-04-16 MED ORDER — SODIUM CHLORIDE 0.9% FLUSH
10.0000 mL | Freq: Once | INTRAVENOUS | Status: AC
  Administered 2020-04-16: 10 mL via INTRAVENOUS
  Filled 2020-04-16: qty 10

## 2020-04-16 MED ORDER — ACETAMINOPHEN 325 MG PO TABS
650.0000 mg | ORAL_TABLET | Freq: Once | ORAL | Status: AC
  Administered 2020-04-16: 650 mg via ORAL
  Filled 2020-04-16: qty 2

## 2020-04-16 MED ORDER — HEPARIN SOD (PORK) LOCK FLUSH 100 UNIT/ML IV SOLN
INTRAVENOUS | Status: AC
  Filled 2020-04-16: qty 5

## 2020-04-16 MED ORDER — TRASTUZUMAB-DKST CHEMO 150 MG IV SOLR
450.0000 mg | Freq: Once | INTRAVENOUS | Status: AC
  Administered 2020-04-16: 450 mg via INTRAVENOUS
  Filled 2020-04-16: qty 21.43

## 2020-04-16 NOTE — Progress Notes (Signed)
[place] OFFICE PROGRESS NOTE  Patient Care Team: [name], [name], MD (General Surgery) Requested, [name]   Oncology History Overview Note  # June 2016- LEFT BREAST CA;  [name] ; [name] ; [name]; Her 2 Neu POS; [place] from July OF 2016; [name]; adjuvant Herceptin [ Finished July 2017]; AUG 2017- Neratinib x5 days; DISCON sec to diarrhea  # MID OCT 2018- Right breast mass-Bx- ER/PR-NEG; Her 2 NEU POSITIVE 1~2.5cm;  [?NEW primary]  # MID-OCT 2018-METASTATIC RECURRENT-oh sternal mass; Left Ax LN [Bx]/periportal LN  # OCT 25th 2018- TAXOL-HERCEPTIN-PERJETA; Jan 2019- CT PR; continue HP only; on HOLD since NOV 2019--sec to drop in EF/Sieziues  # July 15th 2020- RE-STARTED HP   -------------------------------------------------------------------  # MUGA scan- July 28th 2017- 67%.  December 2019-EF 53%; January 2020 EF-52%  # chronic gait/balance issues  # Seizures/petit-mal/ Dr.Shah-Keppra [name] [id number]]  # June 2017- left breast Bx- fat necrosis [[name]  # july 2017-  BRCA 1& 2- NEG.   MOLECULAR TESTING- F ONE- TPS- 0%;  ERB2 amplification; PI3K/RET amplification Others**  # PALLIATIVE CARE: P  --------------------------------------------------    DIAGNOSIS: [ OCT 4825]- REC/MET- BREAST CA ER/PR-NEG; her 2 POS  STAGE: 4  ;GOALS: Palliative  CURRENT/MOST RECENT THERAPY- Herceptin-Perjeta [C]    Carcinoma of overlapping sites of left breast in female, estrogen receptor negative ([place])     INTERVAL HISTORY: Patient a poor historian given dementia.  Patient alone today.  [name] 79 y.o.  female pleasant patient above history of metastatic breast cancer on Herceptin plus Perjeta-is here for follow-up.  Patient in general denies any shortness of breath or  cough.  States appetite is good.  Chronic gait instability chronic low back pain not any worse.  Denies any new lumps or bumps.    Review of Systems  Unable to perform ROS: Dementia    PAST MEDICAL HISTORY :  Past Medical History:  Diagnosis Date  . Arthritis   . 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 Breast cancer ([place]) 2018   left breast; surgery 01/11/15 with Dr. Bary Castilla; path with invasive mammary and DCIS  . Breast cancer of upper-inner quadrant of left female breast ([place]) 12/15/14   Completed radiation end of December and finished chemotherapy 2 weeks ago, Left breast invasive mammary carcinoma, T1cN106mc (1.5 cm); Grade 3, [name], [name], [name], .  .Michele KitchenCataract    bilat   . DDD (degenerative disc disease), lumbar    Lumbar, previously evaluated by Dr. HEarnestine Leys . Dementia ([place]   . Fibrocystic breast disease    prior biopsy  . Glaucoma   . 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 Hearing loss   . History of cancer chemotherapy   . History of radiation therapy   . Hyperlipidemia, unspecified   . Imbalance   . Memory impairment    seen by Dr SManuella Ghazi possible post crainiotomy from radiation  . [place]ioma ([place]    [facility]  . Numbness and tingling    right hand   . Osteoarthritis   . Osteoporosis, post-menopausal   . Personal history of chemotherapy   . Personal history  of radiation therapy   . Shoulder pain, right   . Wears glasses     PAST SURGICAL HISTORY :   Past Surgical History:  Procedure Laterality Date  . APPENDECTOMY  1950  . BRAIN SURGERY  1995   left frontal/temporal  . BREAST BIOPSY Left 12/15/14   confirmed DCIS  . BREAST BIOPSY Left 01/08/2015   Procedure: BREAST BIOPSY WITH NEEDLE LOCALIZATION;  Surgeon: [name], MD;  Location: [facility] ORS;  Service: General;  Laterality: Left;  . BREAST EXCISIONAL BIOPSY  Left 1997  . BREAST LUMPECTOMY Left 01/08/2015   Procedure: LUMPECTOMY;  Surgeon: [name], MD;  Location: [facility] ORS;  Service: General;  Laterality: Left;  . COLONOSCOPY  2010   Dr. [name]  . ORIF ANKLE FRACTURE Right 08/05/2017   Procedure: OPEN REDUCTION INTERNAL FIXATION (ORIF) ANKLE FRACTURE;  Surgeon: Earnestine Leys, MD;  Location: [facility] ORS;  Service: Orthopedics;  Laterality: Right;  . PORTACATH PLACEMENT Right 01/16/2015   Procedure: INSERTION PORT-A-CATH;  Surgeon: [name], MD;  Location: [facility] ORS;  Service: General;  Laterality: Right;  . SENTINEL NODE BIOPSY Left 01/16/2015   Procedure: SENTINEL NODE BIOPSY;  Surgeon: [name], MD;  Location: [facility] ORS;  Service: General;  Laterality: Left;  . TOTAL HIP ARTHROPLASTY Right 09/04/2015   Procedure: RIGHT TOTAL HIP ARTHROPLASTY ANTERIOR APPROACH;  Surgeon: [name], MD;  Location: [facility];  Service: Orthopedics;  Laterality: Right;    FAMILY HISTORY :   Family History  Problem Relation Age of Onset  . Breast cancer Sister 40  . Addison's disease Mother   . Hyperthyroidism Mother   . Osteoarthritis Mother   . Stroke Father   . Heart disease Father   . High blood pressure Father     SOCIAL HISTORY:   Social History   Tobacco Use  . Smoking status: Former Smoker    Packs/day: 0.25    Years: 5.00    Pack years: 1.25    Types: Cigarettes    Quit date: 07/28/1972    Years since quitting: 47.7  . Smokeless tobacco: Never Used  Vaping Use  . Vaping Use: Never used  Substance Use Topics  . Alcohol use: Yes    Alcohol/week: 1.0 - 2.0 standard drink    Types: 1 - 2 Glasses of wine per week    Comment: 1 Glass Wine / Night  . Drug use: No    ALLERGIES:  is allergic to donepezil.  MEDICATIONS:  Current Outpatient Medications  Medication Sig Dispense Refill  . acetaminophen (TYLENOL) 325 MG tablet Take 650 mg by mouth every 4 (four) hours as needed. Pain / increased temp.    . ARTIFICIAL TEARS 1 %  ophthalmic solution Place 1 drop into both eyes daily.    [name] ascorbic acid (VITAMIN C) 500 MG tablet Take 1 tablet by mouth daily.    . Calcium Carbonate-Vitamin D (CALTRATE 600+D PO) Take 1 tablet by mouth daily.    . celecoxib (CELEBREX) 200 MG capsule Take 200 mg by mouth daily.     . Cetirizine HCl (ZYRTEC ALLERGY PO) Take 1 tablet by mouth as needed (allergies).    . chlorhexidine (PERIDEX) 0.12 % solution 15 mLs 2 (two) times daily.    [name] levETIRAcetam (KEPPRA) 250 MG tablet 1 tablet daily at bedtime  5  . traMADol (ULTRAM) 50 MG tablet Take 1 tablet (50 mg total) by mouth 2 (two) times daily as needed for moderate pain. 60 tablet 0  .  Travoprost, BAK Free, (TRAVATAN) 0.004 % SOLN ophthalmic solution travoprost 0.004 % eye drops    . vitamin E 400 UNIT capsule Take 400 Units by mouth daily.      No current facility-administered medications for this visit.   Facility-Administered Medications Ordered in Other Visits  Medication Dose Route Frequency Provider Last Rate Last Admin  . heparin lock flush 100 unit/mL  500 Units Intravenous Once Charlaine Dalton R, MD      . sodium chloride flush (NS) 0.9 % injection 10 mL  10 mL Intravenous PRN Cammie Sickle, MD   10 mL at 07/07/18 0830    PHYSICAL EXAMINATION: ECOG PERFORMANCE STATUS: 2 - Symptomatic, <50% confined to bed  BP 123/64   Pulse 70   Temp 98.1 F (36.7 C) (Tympanic)   Resp 16   Ht 5' 5"  (1.651 m)   Wt 177 lb (80.3 kg)   SpO2 100%   BMI 29.45 kg/m   Filed Weights   04/16/20 0825  Weight: 177 lb (80.3 kg)     Physical Exam Constitutional:      Comments: Patient is accompanied by husband.  In a wheelchair.  HENT:     Head: Normocephalic and atraumatic.     Mouth/Throat:     Pharynx: No oropharyngeal exudate.  Eyes:     Pupils: Pupils are equal, round, and reactive to light.  Cardiovascular:     Rate and Rhythm: Normal rate and regular rhythm.  Pulmonary:     Effort: Pulmonary effort is normal. No  respiratory distress.     Breath sounds: Normal breath sounds. No wheezing.  Abdominal:     General: Bowel sounds are normal. There is no distension.     Palpations: Abdomen is soft. There is no mass.     Tenderness: There is no abdominal tenderness. There is no guarding or rebound.  Musculoskeletal:        General: No tenderness. Normal range of motion.     Cervical back: Normal range of motion and neck supple.  Skin:    General: Skin is warm.  Neurological:     Mental Status: She is alert and oriented to person, place, and time.  Psychiatric:        Mood and Affect: Affect normal.     LABORATORY DATA:  I have reviewed the data as listed    Component Value Date/Time   NA 137 04/16/2020 0814   K 3.8 04/16/2020 0814   CL 100 04/16/2020 0814   CO2 25 04/16/2020 0814   GLUCOSE 169 (H) 04/16/2020 0814   BUN 17 04/16/2020 0814   CREATININE 0.78 04/16/2020 0814   CALCIUM 8.4 (L) 04/16/2020 0814   PROT 7.0 04/16/2020 0814   ALBUMIN 4.0 04/16/2020 0814   AST 24 04/16/2020 0814   ALT 17 04/16/2020 0814   ALKPHOS 51 04/16/2020 0814   BILITOT 0.6 04/16/2020 0814   GFRNONAA >60 04/16/2020 0814   GFRAA >60 04/16/2020 0814    No results found for: SPEP, UPEP  Lab Results  Component Value Date   WBC 6.4 04/16/2020   NEUTROABS 4.8 04/16/2020   HGB 13.8 04/16/2020   HCT 41.9 04/16/2020   MCV 92.1 04/16/2020   PLT 292 04/16/2020      Chemistry      Component Value Date/Time   NA 137 04/16/2020 0814   K 3.8 04/16/2020 0814   CL 100 04/16/2020 0814   CO2 25 04/16/2020 0814   BUN 17 04/16/2020 0712  CREATININE 0.78 04/16/2020 0814      Component Value Date/Time   CALCIUM 8.4 (L) 04/16/2020 0814   ALKPHOS 51 04/16/2020 0814   AST 24 04/16/2020 0814   ALT 17 04/16/2020 0814   BILITOT 0.6 04/16/2020 0814       RADIOGRAPHIC STUDIES: I have personally reviewed the radiological images as listed and agreed with the findings in the report. No results found.    ASSESSMENT & PLAN:  Carcinoma of overlapping sites of left breast in female, estrogen receptor negative ([place]) #Metastatic breast cancer ER [name] HER-2/neu positive. AUG 5th 2021- CT scan chest and pelvis NED- STABLE;  stable sclerosis of the left manubrium. STABLE.   # Proceed with Herceptin Perjeta- Labs today reviewed;  acceptable for treatment today.   # Drop in ejection fraction-June 14 MUGA scan ejection fraction 47%; AUG 2021- MUGA scan- 61%. STABLE. Will repeat in NOV 2021.    # Chronic back and hip pain/ gait instability- STABLE; on asper patch/ tramadol prn.    DISPOSITION:  #Herceptin-Perjeta today # Follow up- October 12th- MD; labs- labs-cbc/cmp;  Herceptin-Perjeta; Dr.B    No orders of the defined types were placed in this encounter.  All questions were answered. The patient knows to call the clinic with any problems, questions or concerns.      Cammie Sickle, MD 04/16/2020 8:50 AM

## 2020-04-16 NOTE — Assessment & Plan Note (Addendum)
#  Metastatic breast cancer ER [name] HER-2/neu positive. AUG 5th 2021- CT scan chest and pelvis NED- STABLE;  stable sclerosis of the left manubrium. STABLE.   # Proceed with Herceptin Perjeta- Labs today reviewed;  acceptable for treatment today.   # Drop in ejection fraction-June 14 MUGA scan ejection fraction 47%; AUG 2021- MUGA scan- 61%. STABLE. Will repeat in NOV 2021.    # Chronic back and hip pain/ gait instability- STABLE; on asper patch/ tramadol prn.    DISPOSITION:  #Herceptin-Perjeta today # Follow up- October 12th- MD; labs- labs-cbc/cmp;  Herceptin-Perjeta; Dr.B

## 2020-04-17 ENCOUNTER — Telehealth: Payer: Self-pay | Admitting: Internal Medicine

## 2020-04-17 NOTE — Telephone Encounter (Signed)
On 9/20-I spoke to patient's husband-concerned about 9 pound weight gain in the last 3 weeks.  No evidence of any congestive heart failure.  Recommend checking weight on a weekly basis; and inform us if significant weight gain noted.  Husband in agreement. GB

## 2020-05-08 ENCOUNTER — Inpatient Hospital Stay: Payer: Medicare Other | Attending: Internal Medicine

## 2020-05-08 ENCOUNTER — Inpatient Hospital Stay: Payer: Medicare Other

## 2020-05-08 ENCOUNTER — Inpatient Hospital Stay (HOSPITAL_BASED_OUTPATIENT_CLINIC_OR_DEPARTMENT_OTHER): Payer: Medicare Other | Admitting: Internal Medicine

## 2020-05-08 ENCOUNTER — Other Ambulatory Visit: Payer: Self-pay

## 2020-05-08 ENCOUNTER — Encounter: Payer: Self-pay | Admitting: Internal Medicine

## 2020-05-08 DIAGNOSIS — R2689 Other abnormalities of gait and mobility: Secondary | ICD-10-CM | POA: Insufficient documentation

## 2020-05-08 DIAGNOSIS — C50812 Malignant neoplasm of overlapping sites of left female breast: Secondary | ICD-10-CM | POA: Diagnosis present

## 2020-05-08 DIAGNOSIS — Z171 Estrogen receptor negative status [ER-]: Secondary | ICD-10-CM

## 2020-05-08 DIAGNOSIS — G8929 Other chronic pain: Secondary | ICD-10-CM | POA: Diagnosis not present

## 2020-05-08 DIAGNOSIS — M549 Dorsalgia, unspecified: Secondary | ICD-10-CM | POA: Diagnosis not present

## 2020-05-08 DIAGNOSIS — Z5112 Encounter for antineoplastic immunotherapy: Secondary | ICD-10-CM | POA: Diagnosis present

## 2020-05-08 DIAGNOSIS — F0390 Unspecified dementia without behavioral disturbance: Secondary | ICD-10-CM | POA: Insufficient documentation

## 2020-05-08 LAB — CBC WITH DIFFERENTIAL/PLATELET
Abs Immature Granulocytes: 0.03 10*3/uL (ref 0.00–0.07)
Basophils Absolute: 0 10*3/uL (ref 0.0–0.1)
Basophils Relative: 1 %
Eosinophils Absolute: 0.2 10*3/uL (ref 0.0–0.5)
Eosinophils Relative: 2 %
HCT: 41.8 % (ref 36.0–46.0)
Hemoglobin: 13.8 g/dL (ref 12.0–15.0)
Immature Granulocytes: 1 %
Lymphocytes Relative: 17 %
Lymphs Abs: 1.1 10*3/uL (ref 0.7–4.0)
MCH: 30.4 pg (ref 26.0–34.0)
MCHC: 33 g/dL (ref 30.0–36.0)
MCV: 92.1 fL (ref 80.0–100.0)
Monocytes Absolute: 0.3 10*3/uL (ref 0.1–1.0)
Monocytes Relative: 5 %
Neutro Abs: 4.7 10*3/uL (ref 1.7–7.7)
Neutrophils Relative %: 74 %
Platelets: 297 10*3/uL (ref 150–400)
RBC: 4.54 MIL/uL (ref 3.87–5.11)
RDW: 13.2 % (ref 11.5–15.5)
WBC: 6.3 10*3/uL (ref 4.0–10.5)
nRBC: 0 % (ref 0.0–0.2)

## 2020-05-08 LAB — COMPREHENSIVE METABOLIC PANEL
ALT: 17 U/L (ref 0–44)
AST: 25 U/L (ref 15–41)
Albumin: 3.9 g/dL (ref 3.5–5.0)
Alkaline Phosphatase: 53 U/L (ref 38–126)
Anion gap: 9 (ref 5–15)
BUN: 18 mg/dL (ref 8–23)
CO2: 27 mmol/L (ref 22–32)
Calcium: 8.5 mg/dL — ABNORMAL LOW (ref 8.9–10.3)
Chloride: 102 mmol/L (ref 98–111)
Creatinine, Ser: 0.84 mg/dL (ref 0.44–1.00)
GFR, Estimated: >60 mL/min (ref >60)
Glucose, Bld: 188 mg/dL — ABNORMAL HIGH (ref 70–99)
Potassium: 3.6 mmol/L (ref 3.5–5.1)
Sodium: 138 mmol/L (ref 135–145)
Total Bilirubin: 0.6 mg/dL (ref 0.3–1.2)
Total Protein: 7 g/dL (ref 6.5–8.1)

## 2020-05-08 MED ORDER — HEPARIN SOD (PORK) LOCK FLUSH 100 UNIT/ML IV SOLN
INTRAVENOUS | Status: AC
  Filled 2020-05-08: qty 5

## 2020-05-08 MED ORDER — DIPHENHYDRAMINE HCL 50 MG/ML IJ SOLN
12.5000 mg | Freq: Once | INTRAMUSCULAR | Status: AC
  Administered 2020-05-08: 12.5 mg via INTRAVENOUS
  Filled 2020-05-08: qty 1

## 2020-05-08 MED ORDER — HEPARIN SOD (PORK) LOCK FLUSH 100 UNIT/ML IV SOLN
500.0000 [IU] | Freq: Once | INTRAVENOUS | Status: AC | PRN
  Administered 2020-05-08: 500 [IU]
  Filled 2020-05-08: qty 5

## 2020-05-08 MED ORDER — TRASTUZUMAB-DKST CHEMO 150 MG IV SOLR
450.0000 mg | Freq: Once | INTRAVENOUS | Status: AC
  Administered 2020-05-08: 450 mg via INTRAVENOUS
  Filled 2020-05-08: qty 21.43

## 2020-05-08 MED ORDER — SODIUM CHLORIDE 0.9 % IV SOLN
420.0000 mg | Freq: Once | INTRAVENOUS | Status: AC
  Administered 2020-05-08: 420 mg via INTRAVENOUS
  Filled 2020-05-08: qty 14

## 2020-05-08 MED ORDER — ACETAMINOPHEN 325 MG PO TABS
650.0000 mg | ORAL_TABLET | Freq: Once | ORAL | Status: AC
  Administered 2020-05-08: 650 mg via ORAL
  Filled 2020-05-08: qty 2

## 2020-05-08 MED ORDER — SODIUM CHLORIDE 0.9 % IV SOLN
Freq: Once | INTRAVENOUS | Status: AC
  Filled 2020-05-08: qty 250

## 2020-05-08 NOTE — Assessment & Plan Note (Addendum)
#  Metastatic breast cancer ER [name] HER-2/neu positive. AUG 5th 2021- CT scan chest and pelvis NED- STABLE;  stable sclerosis of the left manubrium. STABLE.   # Proceed with Herceptin Perjeta- Labs today reviewed;  acceptable for treatment today.   # Drop in ejection fraction-June 14 MUGA scan ejection fraction 47%; AUG 2021- MUGA scan- 61%. STABLE. Will order at next visit NOV 2021.    # dry eyes/ keratitis sicca- ? herceptin-perjeta; not sure if casutive factor.  Continue moisturizing eyedrops.  # Chronic back and hip pain/ gait instability- STABLE;  on asper patch/ tramadol prn.    DISPOSITION:  #Herceptin-Perjeta today # Follow up-in 3 weeks [mondays]; labs- labs-cbc/cmp;  Herceptin-Perjeta; Dr.B

## 2020-05-08 NOTE — Progress Notes (Signed)
[place] OFFICE PROGRESS NOTE  Patient Care Team: [name], [name], MD (General Surgery) Requested, [name]   Oncology History Overview Note  # June 2016- LEFT BREAST CA;  [name] ; [name] ; [name]; Her 2 Neu POS; [place] from July OF 2016; [name]; adjuvant Herceptin [ Finished July 2017]; AUG 2017- Neratinib x5 days; DISCON sec to diarrhea  # MID OCT 2018- Right breast mass-Bx- ER/PR-NEG; Her 2 NEU POSITIVE 1~2.5cm;  [?NEW primary]  # MID-OCT 2018-METASTATIC RECURRENT-oh sternal mass; Left Ax LN [Bx]/periportal LN  # OCT 25th 2018- TAXOL-HERCEPTIN-PERJETA; Jan 2019- CT PR; continue HP only; on HOLD since NOV 2019--sec to drop in EF/Sieziues  # July 15th 2020- RE-STARTED HP   -------------------------------------------------------------------  # MUGA scan- July 28th 2017- 67%.  December 2019-EF 53%; January 2020 EF-52%  # chronic gait/balance issues  # Seizures/petit-mal/ Dr.Shah-Keppra [name] [id number]]  # June 2017- left breast Bx- fat necrosis [[name]  # july 2017-  BRCA 1& 2- NEG.   MOLECULAR TESTING- F ONE- TPS- 0%;  ERB2 amplification; PI3K/RET amplification Others**  # PALLIATIVE CARE: P  --------------------------------------------------    DIAGNOSIS: [ OCT 9678]- REC/MET- BREAST CA ER/PR-NEG; her 2 POS  STAGE: 4  ;GOALS: Palliative  CURRENT/MOST RECENT THERAPY- Herceptin-Perjeta [C]    Carcinoma of overlapping sites of left breast in female, estrogen receptor negative ([place])     INTERVAL HISTORY: Patient a poor historian given dementia.  Patient is accompanied by her husband today.  [name] 79 y.o.  female pleasant patient above history of metastatic breast cancer on Herceptin plus Perjeta-is here for follow-up.  As per the husband patient has  had dry eyes itchy eyes.  She has been evaluated by ophthalmology.  Recommended moisturizing eyedrops.  Otherwise no worsening shortness of breath or cough.  No swelling of the legs.  Chronic gait instability chronic back pain not any worse.  No new lumps or bumps.   Review of Systems  Unable to perform ROS: Dementia    PAST MEDICAL HISTORY :  Past Medical History:  Diagnosis Date  . Arthritis   . 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 Breast cancer ([place]) 2018   left breast; surgery 01/11/15 with Dr. Bary Castilla; path with invasive mammary and DCIS  . Breast cancer of upper-inner quadrant of left female breast ([place]) 12/15/14   Completed radiation end of December and finished chemotherapy 2 weeks ago, Left breast invasive mammary carcinoma, T1cN11mc (1.5 cm); Grade 3, [name], [name], [name], .  .Marland KitchenCataract    bilat   . DDD (degenerative disc disease), lumbar    Lumbar, previously evaluated by Dr. HEarnestine Leys . Dementia ([place]   . Fibrocystic breast disease    prior biopsy  . Glaucoma   . 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 Hearing loss   . History of cancer chemotherapy   . History of radiation therapy   . Hyperlipidemia, unspecified   . Imbalance   . Memory impairment    seen by Dr SManuella Ghazi possible post crainiotomy from radiation  . [place] ([place]    [facility]  . Numbness and tingling  right hand   . Osteoarthritis   . Osteoporosis, post-menopausal   . Personal history of chemotherapy   . Personal history of radiation therapy   . Shoulder pain, right   . Wears glasses     PAST SURGICAL HISTORY :   Past Surgical History:  Procedure Laterality Date  . APPENDECTOMY  1950  . BRAIN SURGERY  1995   left frontal/temporal  . BREAST BIOPSY Left 12/15/14   confirmed DCIS  . BREAST BIOPSY Left 01/08/2015   Procedure: BREAST BIOPSY WITH NEEDLE  LOCALIZATION;  Surgeon: [name], MD;  Location: [facility] ORS;  Service: General;  Laterality: Left;  . BREAST EXCISIONAL BIOPSY Left 1997  . BREAST LUMPECTOMY Left 01/08/2015   Procedure: LUMPECTOMY;  Surgeon: [name], MD;  Location: [facility];  Service: General;  Laterality: Left;  . COLONOSCOPY  2010   Dr. [name]  . ORIF ANKLE FRACTURE Right 08/05/2017   Procedure: OPEN REDUCTION INTERNAL FIXATION (ORIF) ANKLE FRACTURE;  Surgeon: Earnestine Leys, MD;  Location: [facility];  Service: Orthopedics;  Laterality: Right;  . PORTACATH PLACEMENT Right 01/16/2015   Procedure: INSERTION PORT-A-CATH;  Surgeon: [name], MD;  Location: [facility];  Service: General;  Laterality: Right;  . SENTINEL NODE BIOPSY Left 01/16/2015   Procedure: SENTINEL NODE BIOPSY;  Surgeon: [name], MD;  Location: [facility];  Service: General;  Laterality: Left;  . TOTAL HIP ARTHROPLASTY Right 09/04/2015   Procedure: RIGHT TOTAL HIP ARTHROPLASTY ANTERIOR APPROACH;  Surgeon: [name], MD;  [facility];  Service: Orthopedics;  Laterality: Right;    FAMILY HISTORY :   Family History  Problem Relation Age of Onset  . Breast cancer Sister 44  . Addison's disease Mother   . Hyperthyroidism Mother   . Osteoarthritis Mother   . Stroke Father   . Heart disease Father   . High blood pressure Father     SOCIAL HISTORY:   Social History   Tobacco Use  . Smoking status: Former Smoker    Packs/day: 0.25    Years: 5.00    Pack years: 1.25    Types: Cigarettes    Quit date: 07/28/1972    Years since quitting: 47.8  . Smokeless tobacco: Never Used  Vaping Use  . Vaping Use: Never used  Substance Use Topics  . Alcohol use: Yes    Alcohol/week: 1.0 - 2.0 standard drink    Types: 1 - 2 Glasses of wine per week    Comment: 1 Glass Wine / Night  . Drug use: No    ALLERGIES:  is allergic to donepezil.  MEDICATIONS:  Current Outpatient Medications  Medication Sig Dispense Refill  .  acetaminophen (TYLENOL) 325 MG tablet Take 650 mg by mouth every 4 (four) hours as needed. Pain / increased temp.    . ARTIFICIAL TEARS 1 % ophthalmic solution Place 1 drop into both eyes daily.    Marland Kitchen ascorbic acid (VITAMIN C) 500 MG tablet Take 1 tablet by mouth daily.    . Calcium Carbonate-Vitamin D (CALTRATE 600+D PO) Take 1 tablet by mouth daily.    . celecoxib (CELEBREX) 200 MG capsule Take 200 mg by mouth daily.     . Cetirizine HCl (ZYRTEC ALLERGY PO) Take 1 tablet by mouth as needed (allergies).    . chlorhexidine (PERIDEX) 0.12 % solution 15 mLs 2 (two) times daily.    Marland Kitchen levETIRAcetam (KEPPRA) 250 MG tablet 1 tablet daily at bedtime  5  . traMADol (ULTRAM)  50 MG tablet Take 1 tablet (50 mg total) by mouth 2 (two) times daily as needed for moderate pain. 60 tablet 0  . Travoprost, BAK Free, (TRAVATAN) 0.004 % SOLN ophthalmic solution travoprost 0.004 % eye drops    . vitamin E 400 UNIT capsule Take 400 Units by mouth daily.      No current facility-administered medications for this visit.   Facility-Administered Medications Ordered in Other Visits  Medication Dose Route Frequency Provider Last Rate Last Admin  . sodium chloride flush (NS) 0.9 % injection 10 mL  10 mL Intravenous PRN Cammie Sickle, MD   10 mL at 07/07/18 0830    PHYSICAL EXAMINATION: ECOG PERFORMANCE STATUS: 2 - Symptomatic, <50% confined to bed  BP 116/80 (BP Location: Right Arm, Patient Position: Sitting, Cuff Size: Normal)   Pulse 76   Temp 97.9 F (36.6 C) (Tympanic)   Resp 16   Ht 5' 5"  (1.651 m)   Wt 175 lb (79.4 kg)   SpO2 98%   BMI 29.12 kg/m   Filed Weights   05/08/20 0824  Weight: 175 lb (79.4 kg)     Physical Exam Constitutional:      Comments: Patient is accompanied by husband.  In a wheelchair.  HENT:     Head: Normocephalic and atraumatic.     Mouth/Throat:     Pharynx: No oropharyngeal exudate.  Eyes:     Pupils: Pupils are equal, round, and reactive to light.   Cardiovascular:     Rate and Rhythm: Normal rate and regular rhythm.  Pulmonary:     Effort: Pulmonary effort is normal. No respiratory distress.     Breath sounds: Normal breath sounds. No wheezing.  Abdominal:     General: Bowel sounds are normal. There is no distension.     Palpations: Abdomen is soft. There is no mass.     Tenderness: There is no abdominal tenderness. There is no guarding or rebound.  Musculoskeletal:        General: No tenderness. Normal range of motion.     Cervical back: Normal range of motion and neck supple.  Skin:    General: Skin is warm.  Neurological:     Mental Status: She is alert and oriented to person, place, and time.  Psychiatric:        Mood and Affect: Affect normal.     LABORATORY DATA:  I have reviewed the data as listed    Component Value Date/Time   NA 138 05/08/2020 0810   K 3.6 05/08/2020 0810   CL 102 05/08/2020 0810   CO2 27 05/08/2020 0810   GLUCOSE 188 (H) 05/08/2020 0810   BUN 18 05/08/2020 0810   CREATININE 0.84 05/08/2020 0810   CALCIUM 8.5 (L) 05/08/2020 0810   PROT 7.0 05/08/2020 0810   ALBUMIN 3.9 05/08/2020 0810   AST 25 05/08/2020 0810   ALT 17 05/08/2020 0810   ALKPHOS 53 05/08/2020 0810   BILITOT 0.6 05/08/2020 0810   GFRNONAA >60 05/08/2020 0810   GFRAA >60 04/16/2020 0814    No results found for: SPEP, UPEP  Lab Results  Component Value Date   WBC 6.3 05/08/2020   NEUTROABS 4.7 05/08/2020   HGB 13.8 05/08/2020   HCT 41.8 05/08/2020   MCV 92.1 05/08/2020   PLT 297 05/08/2020      Chemistry      Component Value Date/Time   NA 138 05/08/2020 0810   K 3.6 05/08/2020 0810   CL 102 05/08/2020  0810   CO2 27 05/08/2020 0810   BUN 18 05/08/2020 0810   CREATININE 0.84 05/08/2020 0810      Component Value Date/Time   CALCIUM 8.5 (L) 05/08/2020 0810   ALKPHOS 53 05/08/2020 0810   AST 25 05/08/2020 0810   ALT 17 05/08/2020 0810   BILITOT 0.6 05/08/2020 0810       RADIOGRAPHIC STUDIES: I have  personally reviewed the radiological images as listed and agreed with the findings in the report. No results found.   ASSESSMENT & PLAN:  Carcinoma of overlapping sites of left breast in female, estrogen receptor negative ([place]) #Metastatic breast cancer ER [name] HER-2/neu positive. AUG 5th 2021- CT scan chest and pelvis NED- STABLE;  stable sclerosis of the left manubrium. STABLE.   # Proceed with Herceptin Perjeta- Labs today reviewed;  acceptable for treatment today.   # Drop in ejection fraction-June 14 MUGA scan ejection fraction 47%; AUG 2021- MUGA scan- 61%. STABLE. Will order at next visit NOV 2021.    # dry eyes/ keratitis sicca- ? herceptin-perjeta; not sure if casutive factor.  Continue moisturizing eyedrops.  # Chronic back and hip pain/ gait instability- STABLE;  on asper patch/ tramadol prn.    DISPOSITION:  #Herceptin-Perjeta today # Follow up-in 3 weeks [mondays]; labs- labs-cbc/cmp;  Herceptin-Perjeta; Dr.B    No orders of the defined types were placed in this encounter.  All questions were answered. The patient knows to call the clinic with any problems, questions or concerns.      Cammie Sickle, MD 05/08/2020 12:39 PM

## 2020-05-29 ENCOUNTER — Other Ambulatory Visit: Payer: Self-pay

## 2020-05-29 ENCOUNTER — Inpatient Hospital Stay (HOSPITAL_BASED_OUTPATIENT_CLINIC_OR_DEPARTMENT_OTHER): Payer: Medicare Other | Admitting: Internal Medicine

## 2020-05-29 ENCOUNTER — Inpatient Hospital Stay: Payer: Medicare Other | Attending: Internal Medicine

## 2020-05-29 ENCOUNTER — Inpatient Hospital Stay: Payer: Medicare Other

## 2020-05-29 VITALS — BP 124/60 | HR 64 | Temp 97.8°F | Resp 20

## 2020-05-29 DIAGNOSIS — C50812 Malignant neoplasm of overlapping sites of left female breast: Secondary | ICD-10-CM | POA: Insufficient documentation

## 2020-05-29 DIAGNOSIS — Z5112 Encounter for antineoplastic immunotherapy: Secondary | ICD-10-CM | POA: Insufficient documentation

## 2020-05-29 DIAGNOSIS — Z171 Estrogen receptor negative status [ER-]: Secondary | ICD-10-CM | POA: Diagnosis not present

## 2020-05-29 DIAGNOSIS — I429 Cardiomyopathy, unspecified: Secondary | ICD-10-CM | POA: Diagnosis not present

## 2020-05-29 DIAGNOSIS — Z95828 Presence of other vascular implants and grafts: Secondary | ICD-10-CM

## 2020-05-29 DIAGNOSIS — Z5111 Encounter for antineoplastic chemotherapy: Secondary | ICD-10-CM

## 2020-05-29 LAB — COMPREHENSIVE METABOLIC PANEL
ALT: 17 U/L (ref 0–44)
AST: 24 U/L (ref 15–41)
Albumin: 3.7 g/dL (ref 3.5–5.0)
Alkaline Phosphatase: 51 U/L (ref 38–126)
Anion gap: 8 (ref 5–15)
BUN: 18 mg/dL (ref 8–23)
CO2: 26 mmol/L (ref 22–32)
Calcium: 8.5 mg/dL — ABNORMAL LOW (ref 8.9–10.3)
Chloride: 103 mmol/L (ref 98–111)
Creatinine, Ser: 0.72 mg/dL (ref 0.44–1.00)
GFR, Estimated: >60 mL/min (ref >60)
Glucose, Bld: 195 mg/dL — ABNORMAL HIGH (ref 70–99)
Potassium: 3.6 mmol/L (ref 3.5–5.1)
Sodium: 137 mmol/L (ref 135–145)
Total Bilirubin: 0.4 mg/dL (ref 0.3–1.2)
Total Protein: 6.7 g/dL (ref 6.5–8.1)

## 2020-05-29 LAB — CBC WITH DIFFERENTIAL/PLATELET
Abs Immature Granulocytes: 0.02 10*3/uL (ref 0.00–0.07)
Basophils Absolute: 0 10*3/uL (ref 0.0–0.1)
Basophils Relative: 1 %
Eosinophils Absolute: 0.1 10*3/uL (ref 0.0–0.5)
Eosinophils Relative: 2 %
HCT: 41.4 % (ref 36.0–46.0)
Hemoglobin: 13.3 g/dL (ref 12.0–15.0)
Immature Granulocytes: 0 %
Lymphocytes Relative: 16 %
Lymphs Abs: 1 10*3/uL (ref 0.7–4.0)
MCH: 30 pg (ref 26.0–34.0)
MCHC: 32.1 g/dL (ref 30.0–36.0)
MCV: 93.2 fL (ref 80.0–100.0)
Monocytes Absolute: 0.3 10*3/uL (ref 0.1–1.0)
Monocytes Relative: 5 %
Neutro Abs: 4.6 10*3/uL (ref 1.7–7.7)
Neutrophils Relative %: 76 %
Platelets: 271 10*3/uL (ref 150–400)
RBC: 4.44 MIL/uL (ref 3.87–5.11)
RDW: 13.2 % (ref 11.5–15.5)
WBC: 6.1 10*3/uL (ref 4.0–10.5)
nRBC: 0 % (ref 0.0–0.2)

## 2020-05-29 MED ORDER — HEPARIN SOD (PORK) LOCK FLUSH 100 UNIT/ML IV SOLN
INTRAVENOUS | Status: AC
  Filled 2020-05-29: qty 5

## 2020-05-29 MED ORDER — TRASTUZUMAB-DKST CHEMO 150 MG IV SOLR
450.0000 mg | Freq: Once | INTRAVENOUS | Status: AC
  Administered 2020-05-29: 450 mg via INTRAVENOUS
  Filled 2020-05-29: qty 21.4

## 2020-05-29 MED ORDER — SODIUM CHLORIDE 0.9 % IV SOLN
Freq: Once | INTRAVENOUS | Status: AC
  Filled 2020-05-29: qty 250

## 2020-05-29 MED ORDER — DIPHENHYDRAMINE HCL 50 MG/ML IJ SOLN
12.5000 mg | Freq: Once | INTRAMUSCULAR | Status: AC
  Administered 2020-05-29: 12.5 mg via INTRAVENOUS

## 2020-05-29 MED ORDER — HEPARIN SOD (PORK) LOCK FLUSH 100 UNIT/ML IV SOLN
500.0000 [IU] | Freq: Once | INTRAVENOUS | Status: AC
  Administered 2020-05-29: 500 [IU] via INTRAVENOUS
  Filled 2020-05-29: qty 5

## 2020-05-29 MED ORDER — SODIUM CHLORIDE 0.9% FLUSH
10.0000 mL | Freq: Once | INTRAVENOUS | Status: AC
  Administered 2020-05-29: 10 mL via INTRAVENOUS
  Filled 2020-05-29: qty 10

## 2020-05-29 MED ORDER — ACETAMINOPHEN 325 MG PO TABS
650.0000 mg | ORAL_TABLET | Freq: Once | ORAL | Status: AC
  Administered 2020-05-29: 650 mg via ORAL

## 2020-05-29 MED ORDER — SODIUM CHLORIDE 0.9 % IV SOLN
420.0000 mg | Freq: Once | INTRAVENOUS | Status: AC
  Administered 2020-05-29: 420 mg via INTRAVENOUS
  Filled 2020-05-29: qty 14

## 2020-05-29 NOTE — Progress Notes (Signed)
[facility] OFFICE PROGRESS NOTE  Patient Care Team: [name], [name], MD (General Surgery) Requested, [name]   Oncology History Overview Note  # June 2016- LEFT BREAST CA;  [name] ; [name] ; [name]; Her 2 Neu POS; [place] from July OF 2016; [name]; adjuvant Herceptin [ Finished July 2017]; AUG 2017- Neratinib x5 days; DISCON sec to diarrhea  # MID OCT 2018- Right breast mass-Bx- ER/PR-NEG; Her 2 NEU POSITIVE 1~2.5cm;  [?NEW primary]  # MID-OCT 2018-METASTATIC RECURRENT-oh sternal mass; Left Ax LN [Bx]/periportal LN  # OCT 25th 2018- TAXOL-HERCEPTIN-PERJETA; Jan 2019- CT PR; continue HP only; on HOLD since NOV 2019--sec to drop in EF/Sieziues  # July 15th 2020- RE-STARTED HP   -------------------------------------------------------------------  # MUGA scan- July 28th 2017- 67%.  December 2019-EF 53%; January 2020 EF-52%  # chronic gait/balance issues  # Seizures/petit-mal/ Dr.Shah-Keppra [name] [id number]]  # June 2017- left breast Bx- fat necrosis [[name]  # july 2017-  BRCA 1& 2- NEG.   MOLECULAR TESTING- F ONE- TPS- 0%;  ERB2 amplification; PI3K/RET amplification Others**  # PALLIATIVE CARE: P  --------------------------------------------------    DIAGNOSIS: [ OCT 4081]- REC/MET- BREAST CA ER/PR-NEG; her 2 POS  STAGE: 4  ;GOALS: Palliative  CURRENT/MOST RECENT THERAPY- Herceptin-Perjeta [C]    Carcinoma of overlapping sites of left breast in female, estrogen receptor negative ([place])     INTERVAL HISTORY: Patient a poor historian given dementia.  Patient is accompanied by her husband today.  [name] 79 y.o.  female pleasant patient above history of metastatic breast cancer on Herceptin plus Perjeta-is here for follow-up.  As per the husband patient  continues to have itchy eyes dry eyes.  She continues to use moisturizing eyedrops.  Otherwise no new lumps or bumps.  No shortness of breath.  No cough.  No swelling in the legs.  Chronic gait instability.  No falls.   Review of Systems  Unable to perform ROS: Dementia    PAST MEDICAL HISTORY :  Past Medical History:  Diagnosis Date  . Arthritis   . 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 Breast cancer ([place]) 2018   left breast; surgery 01/11/15 with Dr. Bary Castilla; path with invasive mammary and DCIS  . Breast cancer of upper-inner quadrant of left female breast ([place]) 12/15/14   Completed radiation end of December and finished chemotherapy 2 weeks ago, Left breast invasive mammary carcinoma, T1cN57mc (1.5 cm); Grade 3, [name], [name], [name], .  .Marland KitchenCataract    bilat   . DDD (degenerative disc disease), lumbar    Lumbar, previously evaluated by Dr. [place]arnestine Leys . Dementia ([place]   . Fibrocystic breast disease    prior biopsy  . Glaucoma   . 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 Hearing loss   . History of cancer chemotherapy   . History of radiation therapy   . Hyperlipidemia, unspecified   . Imbalance   . Memory impairment    seen by Dr SManuella Ghazi possible post crainiotomy from radiation  . [place]    [facility]  . Numbness and tingling    right hand   . Osteoarthritis   .  Osteoporosis, post-menopausal   . Personal history of chemotherapy   . Personal history of radiation therapy   . Shoulder pain, right   . Wears glasses     PAST SURGICAL HISTORY :   Past Surgical History:  Procedure Laterality Date  . APPENDECTOMY  1950  . BRAIN SURGERY  1995   left frontal/temporal  . BREAST BIOPSY Left 12/15/14   confirmed DCIS  . BREAST BIOPSY Left 01/08/2015   Procedure: BREAST BIOPSY WITH NEEDLE LOCALIZATION;  Surgeon: [name], MD;   Location: [facility] ORS;  Service: General;  Laterality: Left;  . BREAST EXCISIONAL BIOPSY Left 1997  . BREAST LUMPECTOMY Left 01/08/2015   Procedure: LUMPECTOMY;  Surgeon: [name], MD;  Location: [facility] ORS;  Service: General;  Laterality: Left;  . COLONOSCOPY  2010   Dr. [name]  . ORIF ANKLE FRACTURE Right 08/05/2017   Procedure: OPEN REDUCTION INTERNAL FIXATION (ORIF) ANKLE FRACTURE;  Surgeon: Earnestine Leys, MD;  Location: [facility] ORS;  Service: Orthopedics;  Laterality: Right;  . PORTACATH PLACEMENT Right 01/16/2015   Procedure: INSERTION PORT-A-CATH;  Surgeon: [name], MD;  Location: [facility] ORS;  Service: General;  Laterality: Right;  . SENTINEL NODE BIOPSY Left 01/16/2015   Procedure: SENTINEL NODE BIOPSY;  Surgeon: [name], MD;  Location: [facility] ORS;  Service: General;  Laterality: Left;  . TOTAL HIP ARTHROPLASTY Right 09/04/2015   Procedure: RIGHT TOTAL HIP ARTHROPLASTY ANTERIOR APPROACH;  Surgeon: [name], MD;  Location: [facility];  Service: Orthopedics;  Laterality: Right;    FAMILY HISTORY :   Family History  Problem Relation Age of Onset  . Breast cancer Sister 38  . Addison's disease Mother   . Hyperthyroidism Mother   . Osteoarthritis Mother   . Stroke Father   . Heart disease Father   . High blood pressure Father     SOCIAL HISTORY:   Social History   Tobacco Use  . Smoking status: Former Smoker    Packs/day: 0.25    Years: 5.00    Pack years: 1.25    Types: Cigarettes    Quit date: 07/28/1972    Years since quitting: 47.8  . Smokeless tobacco: Never Used  Vaping Use  . Vaping Use: Never used  Substance Use Topics  . Alcohol use: Yes    Alcohol/week: 1.0 - 2.0 standard drink    Types: 1 - 2 Glasses of wine per week    Comment: 1 Glass Wine / Night  . Drug use: No    ALLERGIES:  is allergic to donepezil.  MEDICATIONS:  Current Outpatient Medications  Medication Sig Dispense Refill  . acetaminophen (TYLENOL) 325 MG tablet Take 650 mg by  mouth every 4 (four) hours as needed. Pain / increased temp.    . ARTIFICIAL TEARS 1 % ophthalmic solution Place 1 drop into both eyes daily.    Marland Kitchen ascorbic acid (VITAMIN C) 500 MG tablet Take 1 tablet by mouth daily.    . Calcium Carbonate-Vitamin D (CALTRATE 600+D PO) Take 1 tablet by mouth daily.    . celecoxib (CELEBREX) 200 MG capsule Take 200 mg by mouth daily.     . Cetirizine HCl (ZYRTEC ALLERGY PO) Take 1 tablet by mouth as needed (allergies).    . chlorhexidine (PERIDEX) 0.12 % solution 15 mLs 2 (two) times daily.    Marland Kitchen levETIRAcetam (KEPPRA) 250 MG tablet 1 tablet daily at bedtime  5  . traMADol (ULTRAM) 50 MG tablet Take 1 tablet (50 mg total)  by mouth 2 (two) times daily as needed for moderate pain. 60 tablet 0  . Travoprost, BAK Free, (TRAVATAN) 0.004 % SOLN ophthalmic solution travoprost 0.004 % eye drops    . vitamin E 400 UNIT capsule Take 400 Units by mouth daily.      No current facility-administered medications for this visit.   Facility-Administered Medications Ordered in Other Visits  Medication Dose Route Frequency Provider Last Rate Last Admin  . heparin lock flush 100 unit/mL  500 Units Intravenous Once Cammie Sickle, MD      . pertuzumab (PERJETA) 420 mg in sodium chloride 0.9 % 250 mL chemo infusion  420 mg Intravenous Once Charlaine Dalton R, MD      . sodium chloride flush (NS) 0.9 % injection 10 mL  10 mL Intravenous PRN Cammie Sickle, MD   10 mL at 07/07/18 0830    PHYSICAL EXAMINATION: ECOG PERFORMANCE STATUS: 2 - Symptomatic, <50% confined to bed  BP 124/60   Pulse 64   Temp 97.8 F (36.6 C) (Oral)   Resp 20   There were no vitals filed for this visit.   Physical Exam Constitutional:      Comments: Patient is accompanied by husband.  In a wheelchair.  HENT:     Head: Normocephalic and atraumatic.     Mouth/Throat:     Pharynx: No oropharyngeal exudate.  Eyes:     Pupils: Pupils are equal, round, and reactive to light.   Cardiovascular:     Rate and Rhythm: Normal rate and regular rhythm.  Pulmonary:     Effort: Pulmonary effort is normal. No respiratory distress.     Breath sounds: Normal breath sounds. No wheezing.  Abdominal:     General: Bowel sounds are normal. There is no distension.     Palpations: Abdomen is soft. There is no mass.     Tenderness: There is no abdominal tenderness. There is no guarding or rebound.  Musculoskeletal:        General: No tenderness. Normal range of motion.     Cervical back: Normal range of motion and neck supple.  Skin:    General: Skin is warm.  Neurological:     Mental Status: She is alert and oriented to person, place, and time.  Psychiatric:        Mood and Affect: Affect normal.     LABORATORY DATA:  I have reviewed the data as listed    Component Value Date/Time   NA 137 05/29/2020 0830   K 3.6 05/29/2020 0830   CL 103 05/29/2020 0830   CO2 26 05/29/2020 0830   GLUCOSE 195 (H) 05/29/2020 0830   BUN 18 05/29/2020 0830   CREATININE 0.72 05/29/2020 0830   CALCIUM 8.5 (L) 05/29/2020 0830   PROT 6.7 05/29/2020 0830   ALBUMIN 3.7 05/29/2020 0830   AST 24 05/29/2020 0830   ALT 17 05/29/2020 0830   ALKPHOS 51 05/29/2020 0830   BILITOT 0.4 05/29/2020 0830   GFRNONAA >60 05/29/2020 0830   GFRAA >60 04/16/2020 0814    No results found for: SPEP, UPEP  Lab Results  Component Value Date   WBC 6.1 05/29/2020   NEUTROABS 4.6 05/29/2020   HGB 13.3 05/29/2020   HCT 41.4 05/29/2020   MCV 93.2 05/29/2020   PLT 271 05/29/2020      Chemistry      Component Value Date/Time   NA 137 05/29/2020 0830   K 3.6 05/29/2020 0830   CL 103 05/29/2020  0830   CO2 26 05/29/2020 0830   BUN 18 05/29/2020 0830   CREATININE 0.72 05/29/2020 0830      Component Value Date/Time   CALCIUM 8.5 (L) 05/29/2020 0830   ALKPHOS 51 05/29/2020 0830   AST 24 05/29/2020 0830   ALT 17 05/29/2020 0830   BILITOT 0.4 05/29/2020 0830       RADIOGRAPHIC STUDIES: I have  personally reviewed the radiological images as listed and agreed with the findings in the report. No results found.   ASSESSMENT & PLAN:  Carcinoma of overlapping sites of left breast in female, estrogen receptor negative ([place]) #Metastatic breast cancer ER [name] HER-2/neu positive. AUG 5th 2021- CT scan chest and pelvis NED- STABLE;  stable sclerosis of the left manubrium. STABLE.   # Proceed with Herceptin Perjeta- Labs today reviewed;  acceptable for treatment today.   # Drop in ejection fraction-June 14 MUGA scan ejection fraction 47%; AUG 2021- MUGA scan- 61%. STABLE. Will order MUGA scan today.     # dry eyes/ keratitis sicca-unlikely herceptin-perjeta; not sure if casutive factor.  Continue moisturizing eyedrops. STABLE.   # Chronic back and hip pain/ gait instability-stable on asper patch/ tramadol prn.    DISPOSITION:  #Herceptin-Perjeta today # Follow up-in 3 weeks  labs- labs-cbc/cmp;  Herceptin-Perjeta;MUGA scan prior- Dr.B    Orders Placed This Encounter  Procedures  . NM Cardiac [facility]    Standing Status:   Future    Standing Expiration Date:   05/29/2021    Order Specific Question:   Will [facility] be the location of this test?    Answer:   Yes    Order Specific Question:   caspofungin (CANCIDAS) frequency    Answer:   Q 24H    Order Specific Question:   Preferred imaging location?    Answer:   [facility]    Order Specific Question:   Radiology Contrast Protocol - do NOT remove file path    Answer:   [email or web address]   All questions were answered. The patient knows to call the clinic with any problems, questions or concerns.      Cammie Sickle, MD 05/29/2020 11:44 AM

## 2020-05-29 NOTE — Progress Notes (Signed)
1256- Patient tolerated treatment well. Patient discharged to home at this time.

## 2020-05-29 NOTE — Assessment & Plan Note (Addendum)
#  Metastatic breast cancer ER [name] HER-2/neu positive. AUG 5th 2021- CT scan chest and pelvis NED- STABLE;  stable sclerosis of the left manubrium. STABLE.   # Proceed with Herceptin Perjeta- Labs today reviewed;  acceptable for treatment today.   # Drop in ejection fraction-June 14 MUGA scan ejection fraction 47%; AUG 2021- MUGA scan- 61%. STABLE. Will order MUGA scan today.     # dry eyes/ keratitis sicca-unlikely herceptin-perjeta; not sure if casutive factor.  Continue moisturizing eyedrops. STABLE.   # Chronic back and hip pain/ gait instability-stable on asper patch/ tramadol prn.    DISPOSITION:  #Herceptin-Perjeta today # Follow up-in 3 weeks  labs- labs-cbc/cmp;  Herceptin-Perjeta;MUGA scan prior- Dr.B

## 2020-06-15 ENCOUNTER — Other Ambulatory Visit: Payer: Self-pay

## 2020-06-15 ENCOUNTER — Encounter
Admission: RE | Admit: 2020-06-15 | Discharge: 2020-06-15 | Disposition: A | Discharge status: Home / Self Care | Payer: Medicare Other | Source: Ambulatory Visit | Attending: Internal Medicine | Admitting: Internal Medicine

## 2020-06-15 DIAGNOSIS — I429 Cardiomyopathy, unspecified: Secondary | ICD-10-CM | POA: Diagnosis present

## 2020-06-15 DIAGNOSIS — Z5111 Encounter for antineoplastic chemotherapy: Secondary | ICD-10-CM | POA: Diagnosis present

## 2020-06-15 MED ORDER — TECHNETIUM TC 99M-LABELED RED BLOOD CELLS IV KIT
20.0000 | PACK | Freq: Once | INTRAVENOUS | Status: AC | PRN
  Administered 2020-06-15: 20.82 via INTRAVENOUS

## 2020-06-19 ENCOUNTER — Inpatient Hospital Stay: Payer: Medicare Other

## 2020-06-19 ENCOUNTER — Other Ambulatory Visit: Payer: Self-pay

## 2020-06-19 ENCOUNTER — Encounter: Payer: Self-pay | Admitting: Internal Medicine

## 2020-06-19 ENCOUNTER — Inpatient Hospital Stay (HOSPITAL_BASED_OUTPATIENT_CLINIC_OR_DEPARTMENT_OTHER): Payer: Medicare Other | Admitting: Internal Medicine

## 2020-06-19 DIAGNOSIS — Z171 Estrogen receptor negative status [ER-]: Secondary | ICD-10-CM

## 2020-06-19 DIAGNOSIS — Z5112 Encounter for antineoplastic immunotherapy: Secondary | ICD-10-CM | POA: Diagnosis not present

## 2020-06-19 DIAGNOSIS — C50812 Malignant neoplasm of overlapping sites of left female breast: Secondary | ICD-10-CM

## 2020-06-19 LAB — CBC WITH DIFFERENTIAL/PLATELET
Abs Immature Granulocytes: 0.03 10*3/uL (ref 0.00–0.07)
Basophils Absolute: 0 10*3/uL (ref 0.0–0.1)
Basophils Relative: 0 %
Eosinophils Absolute: 0.1 10*3/uL (ref 0.0–0.5)
Eosinophils Relative: 1 %
HCT: 42.7 % (ref 36.0–46.0)
Hemoglobin: 14.1 g/dL (ref 12.0–15.0)
Immature Granulocytes: 0 %
Lymphocytes Relative: 10 %
Lymphs Abs: 0.9 10*3/uL (ref 0.7–4.0)
MCH: 30.1 pg (ref 26.0–34.0)
MCHC: 33 g/dL (ref 30.0–36.0)
MCV: 91.2 fL (ref 80.0–100.0)
Monocytes Absolute: 0.4 10*3/uL (ref 0.1–1.0)
Monocytes Relative: 5 %
Neutro Abs: 7.7 10*3/uL (ref 1.7–7.7)
Neutrophils Relative %: 84 %
Platelets: 308 10*3/uL (ref 150–400)
RBC: 4.68 MIL/uL (ref 3.87–5.11)
RDW: 13.3 % (ref 11.5–15.5)
WBC: 9.3 10*3/uL (ref 4.0–10.5)
nRBC: 0 % (ref 0.0–0.2)

## 2020-06-19 LAB — COMPREHENSIVE METABOLIC PANEL
ALT: 20 U/L (ref 0–44)
AST: 28 U/L (ref 15–41)
Albumin: 3.9 g/dL (ref 3.5–5.0)
Alkaline Phosphatase: 53 U/L (ref 38–126)
Anion gap: 12 (ref 5–15)
BUN: 18 mg/dL (ref 8–23)
CO2: 25 mmol/L (ref 22–32)
Calcium: 8.4 mg/dL — ABNORMAL LOW (ref 8.9–10.3)
Chloride: 99 mmol/L (ref 98–111)
Creatinine, Ser: 0.79 mg/dL (ref 0.44–1.00)
GFR, Estimated: >60 mL/min (ref >60)
Glucose, Bld: 200 mg/dL — ABNORMAL HIGH (ref 70–99)
Potassium: 3.6 mmol/L (ref 3.5–5.1)
Sodium: 136 mmol/L (ref 135–145)
Total Bilirubin: 0.7 mg/dL (ref 0.3–1.2)
Total Protein: 7.1 g/dL (ref 6.5–8.1)

## 2020-06-19 MED ORDER — TRASTUZUMAB-DKST CHEMO 150 MG IV SOLR
450.0000 mg | Freq: Once | INTRAVENOUS | Status: AC
  Administered 2020-06-19: 450 mg via INTRAVENOUS
  Filled 2020-06-19: qty 21.43

## 2020-06-19 MED ORDER — SODIUM CHLORIDE 0.9 % IV SOLN
420.0000 mg | Freq: Once | INTRAVENOUS | Status: AC
  Administered 2020-06-19: 420 mg via INTRAVENOUS
  Filled 2020-06-19: qty 14

## 2020-06-19 MED ORDER — HEPARIN SOD (PORK) LOCK FLUSH 100 UNIT/ML IV SOLN
INTRAVENOUS | Status: AC
  Filled 2020-06-19: qty 5

## 2020-06-19 MED ORDER — ACETAMINOPHEN 325 MG PO TABS
650.0000 mg | ORAL_TABLET | Freq: Once | ORAL | Status: AC
  Administered 2020-06-19: 650 mg via ORAL
  Filled 2020-06-19: qty 2

## 2020-06-19 MED ORDER — SODIUM CHLORIDE 0.9 % IV SOLN
Freq: Once | INTRAVENOUS | Status: AC
  Filled 2020-06-19: qty 250

## 2020-06-19 MED ORDER — DIPHENHYDRAMINE HCL 50 MG/ML IJ SOLN
12.5000 mg | Freq: Once | INTRAMUSCULAR | Status: AC
  Administered 2020-06-19: 12.5 mg via INTRAVENOUS
  Filled 2020-06-19: qty 1

## 2020-06-19 MED ORDER — HEPARIN SOD (PORK) LOCK FLUSH 100 UNIT/ML IV SOLN
500.0000 [IU] | Freq: Once | INTRAVENOUS | Status: AC | PRN
  Administered 2020-06-19: 500 [IU]
  Filled 2020-06-19: qty 5

## 2020-06-19 NOTE — Assessment & Plan Note (Addendum)
#  Metastatic breast cancer ER [name] HER-2/neu positive. AUG 5th 2021- CT scan chest and pelvis NED- STABLE;  stable sclerosis of the left manubrium. STABLE.   # Proceed with Herceptin Perjeta- Labs today reviewed;  acceptable for treatment today. Plan CT scans; if neg- chemo break next cycle.    # Drop in ejection fraction-June 14 MUGA scan ejection fraction 47%; AUG 2021- MUGA scan- 61%. NOV 2021- EF-55%.   # dry eyes/ keratitis sicca- *herceptin-perjeta; not sure if casutive factor.  Continue moisturizing eyedrops. STABLE.    # Chronic back and hip pain/ gait instability-stable on asper patch/ tramadol prn.    DISPOSITION:  #Herceptin-Perjeta today # Follow up-in 3 weeks  labs- labs-cbc/cmp;  Herceptin-Perjeta;; CT C/A/P- prior- Dr.B

## 2020-06-19 NOTE — Progress Notes (Signed)
1220- Patient tolerated treatment well. Patient discharged to home at this time.

## 2020-06-19 NOTE — Progress Notes (Signed)
[place] OFFICE PROGRESS NOTE  Patient Care Team: [name], [name], MD (General Surgery) Requested, [name]   Oncology History Overview Note  # June 2016- LEFT BREAST CA;  [name] ; [name] ; [name]; Her 2 Neu POS; [place] from July OF 2016; [name]; adjuvant Herceptin [ Finished July 2017]; AUG 2017- Neratinib x5 days; DISCON sec to diarrhea  # MID OCT 2018- Right breast mass-Bx- ER/PR-NEG; Her 2 NEU POSITIVE 1~2.5cm;  [?NEW primary]  # MID-OCT 2018-METASTATIC RECURRENT-oh sternal mass; Left Ax LN [Bx]/periportal LN  # OCT 25th 2018- TAXOL-HERCEPTIN-PERJETA; Jan 2019- CT PR; continue HP only; on HOLD since NOV 2019--sec to drop in EF/Sieziues  # July 15th 2020- RE-STARTED HP   -------------------------------------------------------------------  # MUGA scan- July 28th 2017- 67%.  December 2019-EF 53%; January 2020 EF-52%  # chronic gait/balance issues  # Seizures/petit-mal/ Dr.Shah-Keppra [name] [id number]]  # June 2017- left breast Bx- fat necrosis [[name]  # july 2017-  BRCA 1& 2- NEG.   MOLECULAR TESTING- F ONE- TPS- 0%;  ERB2 amplification; PI3K/RET amplification Others**  # PALLIATIVE CARE: P  --------------------------------------------------    DIAGNOSIS: [ OCT 9798]- REC/MET- BREAST CA ER/PR-NEG; her 2 POS  STAGE: 4  ;GOALS: Palliative  CURRENT/MOST RECENT THERAPY- Herceptin-Perjeta [C]    Carcinoma of overlapping sites of left breast in female, estrogen receptor negative ([place])     INTERVAL HISTORY: Patient a poor historian given dementia.  Patient is accompanied by her husband today.  [name] 79 y.o.  female pleasant patient above history of metastatic breast cancer on Herceptin plus Perjeta-is here for follow-up/review results of the MUGA  scan.  Patient continues to have itchy dry eyes.  Not significantly improved with moisturizing eyedrops.  Otherwise no new lumps or bumps.  No new shortness of breath or cough with no swelling in the legs.  Chronic stable.  No falls.   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 Bary Castilla; path with invasive mammary and DCIS   Breast cancer of upper-inner quadrant of left female breast ([place]) 12/15/14   Completed radiation end of December and finished chemotherapy 2 weeks ago, Left breast invasive mammary carcinoma, T1cN46mc (1.5 cm); Grade 3, [name], [name], [name], .   Cataract    bilat    DDD (degenerative disc disease), lumbar    Lumbar, previously evaluated by Dr. HEarnestine Leys  Dementia ([facility]    Fibrocystic breast disease    prior biopsy   Glaucoma    Hard of hearing    wears hearing aides bilat    Hearing loss    History of cancer chemotherapy    History of radiation therapy    Hyperlipidemia, unspecified    Imbalance    Memory impairment    seen by Dr SManuella Ghazi possible post crainiotomy from radiation   Meningioma ([facility]    [facility].   Numbness and tingling    right hand    Osteoarthritis  Osteoporosis, post-menopausal    Personal history of chemotherapy    Personal history of radiation therapy    Shoulder pain, right    Wears glasses     PAST SURGICAL HISTORY :   Past Surgical History:  Procedure Laterality Date   Fabrica   left frontal/temporal   BREAST BIOPSY Left 12/15/14   confirmed DCIS   BREAST BIOPSY Left 01/08/2015   Procedure: BREAST BIOPSY WITH NEEDLE LOCALIZATION;  Surgeon:  [name], MD;  Location: [facility] ORS;  Service: General;  Laterality: Left;   BREAST EXCISIONAL BIOPSY Left 1997   BREAST LUMPECTOMY Left 01/08/2015   Procedure: LUMPECTOMY;  Surgeon: [name], MD;  Location: [facility] ORS;  Service: General;  Laterality: Left;   COLONOSCOPY  2010   Dr. [name]   ORIF ANKLE FRACTURE Right 08/05/2017   Procedure: OPEN REDUCTION INTERNAL FIXATION (ORIF) ANKLE FRACTURE;  Surgeon: Earnestine Leys, MD;  Location: [facility] ORS;  Service: Orthopedics;  Laterality: Right;   PORTACATH PLACEMENT Right 01/16/2015   Procedure: INSERTION PORT-A-CATH;  Surgeon: [name], MD;  Location: [facility] ORS;  Service: General;  Laterality: Right;   SENTINEL NODE BIOPSY Left 01/16/2015   Procedure: SENTINEL NODE BIOPSY;  Surgeon: [name], MD;  Location: [facility] ORS;  Service: General;  Laterality: Left;   TOTAL HIP ARTHROPLASTY Right 09/04/2015   Procedure: RIGHT TOTAL HIP ARTHROPLASTY ANTERIOR APPROACH;  Surgeon: [name], MD;  [facility]: [facility];  Service: Orthopedics;  Laterality: Right;    FAMILY HISTORY :   Family History  Problem Relation Age of Onset   Breast cancer Sister 18   Addison's disease Mother    Hyperthyroidism Mother    Osteoarthritis Mother    Stroke Father    Heart disease Father    High blood pressure Father     SOCIAL HISTORY:   Social History   Tobacco Use   Smoking status: Former Smoker    Packs/day: 0.25    Years: 5.00    Pack years: 1.25    Types: Cigarettes    Quit date: 07/28/1972    Years since quitting: 47.9   Smokeless tobacco: Never Used  Vaping Use   Vaping Use: Never used  Substance Use Topics   Alcohol use: Yes    Alcohol/week: 1.0 - 2.0 standard drink    Types: 1 - 2 Glasses of wine per week    Comment: 1 Glass Wine / Night   Drug use: No    ALLERGIES:  is allergic to donepezil.  MEDICATIONS:  Current Outpatient Medications  Medication Sig Dispense Refill   acetaminophen (TYLENOL) 325 MG  tablet Take 650 mg by mouth every 4 (four) hours as needed. Pain / increased temp.     ARTIFICIAL TEARS 1 % ophthalmic solution Place 1 drop into both eyes daily.     ascorbic acid (VITAMIN C) 500 MG tablet Take 1 tablet by mouth daily.     Calcium Carbonate-Vitamin D (CALTRATE 600+D PO) Take 1 tablet by mouth daily.     celecoxib (CELEBREX) 200 MG capsule Take 200 mg by mouth daily.      Cetirizine HCl (ZYRTEC ALLERGY PO) Take 1 tablet by mouth as needed (allergies).     chlorhexidine (PERIDEX) 0.12 % solution 15 mLs 2 (two) times daily.     levETIRAcetam (KEPPRA) 250 MG tablet 1 tablet daily at bedtime  5   traMADol (ULTRAM) 50 MG tablet Take 1 tablet (50 mg total)  by mouth 2 (two) times daily as needed for moderate pain. 60 tablet 0   Travoprost, BAK Free, (TRAVATAN) 0.004 % SOLN ophthalmic solution travoprost 0.004 % eye drops     vitamin E 400 UNIT capsule Take 400 Units by mouth daily.      No current facility-administered medications for this visit.   Facility-Administered Medications Ordered in Other Visits  Medication Dose Route Frequency Provider Last Rate Last Admin   sodium chloride flush (NS) 0.9 % injection 10 mL  10 mL Intravenous PRN Cammie Sickle, MD   10 mL at 07/07/18 0830    PHYSICAL EXAMINATION: ECOG PERFORMANCE STATUS: 2 - Symptomatic, <50% confined to bed  BP 116/68 (BP Location: Right Arm, Patient Position: Sitting, Cuff Size: Large)    Pulse 79    Temp 98 F (36.7 C) (Tympanic)    Resp 16    Ht 5' 5"  (1.651 m)    Wt 170 lb (77.1 kg)    SpO2 97%    BMI 28.29 kg/m   Filed Weights   06/19/20 0854  Weight: 170 lb (77.1 kg)     Physical Exam Constitutional:      Comments: Patient is accompanied by husband.  In a wheelchair.  HENT:     Head: Normocephalic and atraumatic.     Mouth/Throat:     Pharynx: No oropharyngeal exudate.  Eyes:     Pupils: Pupils are equal, round, and reactive to light.  Cardiovascular:     Rate and Rhythm: Normal  rate and regular rhythm.  Pulmonary:     Effort: Pulmonary effort is normal. No respiratory distress.     Breath sounds: Normal breath sounds. No wheezing.  Abdominal:     General: Bowel sounds are normal. There is no distension.     Palpations: Abdomen is soft. There is no mass.     Tenderness: There is no abdominal tenderness. There is no guarding or rebound.  Musculoskeletal:        General: No tenderness. Normal range of motion.     Cervical back: Normal range of motion and neck supple.  Skin:    General: Skin is warm.  Neurological:     Mental Status: She is alert and oriented to person, place, and time.  Psychiatric:        Mood and Affect: Affect normal.     LABORATORY DATA:  I have reviewed the data as listed    Component Value Date/Time   NA 136 06/19/2020 0830   K 3.6 06/19/2020 0830   CL 99 06/19/2020 0830   CO2 25 06/19/2020 0830   GLUCOSE 200 (H) 06/19/2020 0830   BUN 18 06/19/2020 0830   CREATININE 0.79 06/19/2020 0830   CALCIUM 8.4 (L) 06/19/2020 0830   PROT 7.1 06/19/2020 0830   ALBUMIN 3.9 06/19/2020 0830   AST 28 06/19/2020 0830   ALT 20 06/19/2020 0830   ALKPHOS 53 06/19/2020 0830   BILITOT 0.7 06/19/2020 0830   GFRNONAA >60 06/19/2020 0830   GFRAA >60 04/16/2020 0814    No results found for: SPEP, UPEP  Lab Results  Component Value Date   WBC 9.3 06/19/2020   NEUTROABS 7.7 06/19/2020   HGB 14.1 06/19/2020   HCT 42.7 06/19/2020   MCV 91.2 06/19/2020   PLT 308 06/19/2020      Chemistry      Component Value Date/Time   NA 136 06/19/2020 0830   K 3.6 06/19/2020 0830   CL 99 06/19/2020 0830  CO2 25 06/19/2020 0830   BUN 18 06/19/2020 0830   CREATININE 0.79 06/19/2020 0830      Component Value Date/Time   CALCIUM 8.4 (L) 06/19/2020 0830   ALKPHOS 53 06/19/2020 0830   AST 28 06/19/2020 0830   ALT 20 06/19/2020 0830   BILITOT 0.7 06/19/2020 0830       RADIOGRAPHIC STUDIES: I have personally reviewed the radiological images as  listed and agreed with the findings in the report. No results found.   ASSESSMENT & PLAN:  Carcinoma of overlapping sites of left breast in female, estrogen receptor negative ([place]) #Metastatic breast cancer ER [name] HER-2/neu positive. AUG 5th 2021- CT scan chest and pelvis NED- STABLE;  stable sclerosis of the left manubrium. STABLE.   # Proceed with Herceptin Perjeta- Labs today reviewed;  acceptable for treatment today. Plan CT scans; if neg- chemo break next cycle.    # Drop in ejection fraction-June 14 MUGA scan ejection fraction 47%; AUG 2021- MUGA scan- 61%. NOV 2021- EF-55%.   # dry eyes/ keratitis sicca- *herceptin-perjeta; not sure if casutive factor.  Continue moisturizing eyedrops. STABLE.    # Chronic back and hip pain/ gait instability-stable on asper patch/ tramadol prn.    DISPOSITION:  #Herceptin-Perjeta today # Follow up-in 3 weeks  labs- labs-cbc/cmp;  Herceptin-Perjeta;; CT C/A/P- prior- Dr.B    Orders Placed This Encounter  Procedures   CT CHEST ABDOMEN PELVIS W CONTRAST    Standing Status:   Future    Standing Expiration Date:   06/19/2021    Order Specific Question:   Preferred imaging location?    Answer:   [facility]    Order Specific Question:   Radiology Contrast Protocol - do NOT remove file path    Answer:   [email or web address]   All questions were answered. The patient knows to call the clinic with any problems, questions or concerns.      Cammie Sickle, MD 06/19/2020 2:42 PM

## 2020-07-06 ENCOUNTER — Ambulatory Visit
Admission: RE | Admit: 2020-07-06 | Discharge: 2020-07-06 | Disposition: A | Discharge status: Home / Self Care | Payer: Medicare Other | Source: Ambulatory Visit | Attending: Internal Medicine | Admitting: Internal Medicine

## 2020-07-06 ENCOUNTER — Other Ambulatory Visit: Payer: Self-pay

## 2020-07-06 DIAGNOSIS — C50812 Malignant neoplasm of overlapping sites of left female breast: Secondary | ICD-10-CM | POA: Insufficient documentation

## 2020-07-06 DIAGNOSIS — Z171 Estrogen receptor negative status [ER-]: Secondary | ICD-10-CM | POA: Insufficient documentation

## 2020-07-06 MED ORDER — IOHEXOL 300 MG/ML  SOLN
85.0000 mL | Freq: Once | INTRAMUSCULAR | Status: AC | PRN
  Administered 2020-07-06: 85 mL via INTRAVENOUS

## 2020-07-10 ENCOUNTER — Other Ambulatory Visit: Payer: Self-pay

## 2020-07-10 ENCOUNTER — Inpatient Hospital Stay: Payer: Medicare Other | Attending: Internal Medicine

## 2020-07-10 ENCOUNTER — Encounter: Payer: Self-pay | Admitting: Internal Medicine

## 2020-07-10 ENCOUNTER — Inpatient Hospital Stay (HOSPITAL_BASED_OUTPATIENT_CLINIC_OR_DEPARTMENT_OTHER): Payer: Medicare Other | Admitting: Internal Medicine

## 2020-07-10 ENCOUNTER — Inpatient Hospital Stay: Payer: Medicare Other

## 2020-07-10 VITALS — BP 131/74 | HR 65 | Resp 16

## 2020-07-10 DIAGNOSIS — C7951 Secondary malignant neoplasm of bone: Secondary | ICD-10-CM | POA: Diagnosis not present

## 2020-07-10 DIAGNOSIS — Z5112 Encounter for antineoplastic immunotherapy: Secondary | ICD-10-CM | POA: Insufficient documentation

## 2020-07-10 DIAGNOSIS — C50812 Malignant neoplasm of overlapping sites of left female breast: Secondary | ICD-10-CM

## 2020-07-10 DIAGNOSIS — Z171 Estrogen receptor negative status [ER-]: Secondary | ICD-10-CM

## 2020-07-10 LAB — CBC WITH DIFFERENTIAL/PLATELET
Abs Immature Granulocytes: 0.03 10*3/uL (ref 0.00–0.07)
Basophils Absolute: 0 10*3/uL (ref 0.0–0.1)
Basophils Relative: 0 %
Eosinophils Absolute: 0.2 10*3/uL (ref 0.0–0.5)
Eosinophils Relative: 3 %
HCT: 41.9 % (ref 36.0–46.0)
Hemoglobin: 13.5 g/dL (ref 12.0–15.0)
Immature Granulocytes: 1 %
Lymphocytes Relative: 17 %
Lymphs Abs: 1.1 10*3/uL (ref 0.7–4.0)
MCH: 29.9 pg (ref 26.0–34.0)
MCHC: 32.2 g/dL (ref 30.0–36.0)
MCV: 92.7 fL (ref 80.0–100.0)
Monocytes Absolute: 0.4 10*3/uL (ref 0.1–1.0)
Monocytes Relative: 6 %
Neutro Abs: 4.4 10*3/uL (ref 1.7–7.7)
Neutrophils Relative %: 73 %
Platelets: 302 10*3/uL (ref 150–400)
RBC: 4.52 MIL/uL (ref 3.87–5.11)
RDW: 13.2 % (ref 11.5–15.5)
WBC: 6.1 10*3/uL (ref 4.0–10.5)
nRBC: 0 % (ref 0.0–0.2)

## 2020-07-10 LAB — COMPREHENSIVE METABOLIC PANEL
ALT: 17 U/L (ref 0–44)
AST: 24 U/L (ref 15–41)
Albumin: 3.9 g/dL (ref 3.5–5.0)
Alkaline Phosphatase: 49 U/L (ref 38–126)
Anion gap: 11 (ref 5–15)
BUN: 18 mg/dL (ref 8–23)
CO2: 26 mmol/L (ref 22–32)
Calcium: 8.4 mg/dL — ABNORMAL LOW (ref 8.9–10.3)
Chloride: 98 mmol/L (ref 98–111)
Creatinine, Ser: 0.78 mg/dL (ref 0.44–1.00)
GFR, Estimated: >60 mL/min (ref >60)
Glucose, Bld: 183 mg/dL — ABNORMAL HIGH (ref 70–99)
Potassium: 3.6 mmol/L (ref 3.5–5.1)
Sodium: 135 mmol/L (ref 135–145)
Total Bilirubin: 0.6 mg/dL (ref 0.3–1.2)
Total Protein: 6.9 g/dL (ref 6.5–8.1)

## 2020-07-10 MED ORDER — ACETAMINOPHEN 325 MG PO TABS
650.0000 mg | ORAL_TABLET | Freq: Once | ORAL | Status: AC
Start: 2020-07-10 — End: 2020-07-10
  Administered 2020-07-10: 09:00:00 650 mg via ORAL
  Filled 2020-07-10: qty 2

## 2020-07-10 MED ORDER — SODIUM CHLORIDE 0.9% FLUSH
10.0000 mL | INTRAVENOUS | Status: DC | PRN
  Administered 2020-07-10: 09:00:00 10 mL via INTRAVENOUS
  Filled 2020-07-10: qty 10

## 2020-07-10 MED ORDER — DIPHENHYDRAMINE HCL 50 MG/ML IJ SOLN
12.5000 mg | Freq: Once | INTRAMUSCULAR | Status: AC
  Administered 2020-07-10: 09:00:00 12.5 mg via INTRAVENOUS
  Filled 2020-07-10: qty 1

## 2020-07-10 MED ORDER — HEPARIN SOD (PORK) LOCK FLUSH 100 UNIT/ML IV SOLN
500.0000 [IU] | Freq: Once | INTRAVENOUS | Status: AC
  Administered 2020-07-10: 11:00:00 500 [IU] via INTRAVENOUS
  Filled 2020-07-10: qty 5

## 2020-07-10 MED ORDER — HEPARIN SOD (PORK) LOCK FLUSH 100 UNIT/ML IV SOLN
INTRAVENOUS | Status: AC
  Filled 2020-07-10: qty 5

## 2020-07-10 MED ORDER — SODIUM CHLORIDE 0.9 % IV SOLN
420.0000 mg | Freq: Once | INTRAVENOUS | Status: AC
  Administered 2020-07-10: 11:00:00 420 mg via INTRAVENOUS
  Filled 2020-07-10: qty 14

## 2020-07-10 MED ORDER — TRASTUZUMAB-DKST CHEMO 150 MG IV SOLR
450.0000 mg | Freq: Once | INTRAVENOUS | Status: AC
  Administered 2020-07-10: 10:00:00 450 mg via INTRAVENOUS
  Filled 2020-07-10: qty 21.43

## 2020-07-10 MED ORDER — SODIUM CHLORIDE 0.9 % IV SOLN
Freq: Once | INTRAVENOUS | Status: AC
  Filled 2020-07-10: qty 250

## 2020-07-10 NOTE — Progress Notes (Signed)
Patient tolerated infusion well. Patient and VSS. Discharged home  

## 2020-07-10 NOTE — Assessment & Plan Note (Addendum)
#  Metastatic breast cancer ER [name] HER-2/neu positive. DEC [id number]- CT scan chest and pelvis NED- STABLE.stable sclerosis of the left [place]..   # Proceed with Herceptin Perjeta- Labs today reviewed;  acceptable for treatment today.Diiscussed chemo holiday.   # Drop in ejection fraction-June 14 MUGA scan ejection fraction 47%; AUG 2021- MUGA scan- 61%. NOV 2021- EF-55%.   # dry eyes/ keratitis sicca- *herceptin-perjeta; not sure if casutive factor.  Continue moisturizing eyedrops.  Stable.    # Chronic back and hip pain/ gait instability-stable.  On asper patch/ tramadol prn.    DISPOSITION:  #Herceptin-Perjeta today # Follow up-in 9 weeks  labs- labs-cbc/cmp;  Herceptin-Perjeta; prior- Dr.B  # I reviewed the blood work- with the patient in detail; also reviewed the imaging independently [as summarized above]; and with the patient in detail.

## 2020-07-10 NOTE — Progress Notes (Signed)
[name] OFFICE PROGRESS NOTE  Patient Care Team: [name], MD as PCP - General (Internal Medicine) Bary Castilla, [name], MD (General Surgery) Requested, Self Cammie Sickle, MD as Consulting Physician (Internal Medicine)  Cancer Staging No matching staging information was found for the patient.   Oncology History Overview Note  # June 2016- LEFT BREAST CA;  [name] ; [name] ; [name]; Her 2 Neu POS; [place] from July OF 2016; [name]; adjuvant Herceptin [ Finished July 2017]; AUG 2017- Neratinib x5 days; DISCON sec to diarrhea  # MID OCT 2018- Right breast mass-Bx- ER/PR-NEG; Her 2 NEU POSITIVE 1~2.5cm;  [?NEW primary]  # MID-OCT 2018-METASTATIC RECURRENT-oh sternal mass; Left Ax LN [Bx]/periportal LN  # OCT 25th 2018- TAXOL-HERCEPTIN-PERJETA; Jan 2019- CT PR; continue HP only; on HOLD since NOV 2019--sec to drop in EF/Sieziues  # July 15th 2020- RE-STARTED HP   -------------------------------------------------------------------  # MUGA scan- July 28th 2017- 67%.  December 2019-EF 53%; January 2020 EF-52%  # chronic gait/balance issues  # Seizures/petit-mal/ Dr.Shah-Keppra [name] [id number]]  # June 2017- left breast Bx- fat necrosis [[name]  # july 2017-  BRCA 1& 2- NEG.   MOLECULAR TESTING- F ONE- TPS- 0%;  ERB2 amplification; PI3K/RET amplification Others**  # PALLIATIVE CARE: P  --------------------------------------------------    DIAGNOSIS: [ OCT 2500]- REC/MET- BREAST CA ER/PR-NEG; her 2 POS  STAGE: 4  ;GOALS: Palliative  CURRENT/MOST RECENT THERAPY- Herceptin-Perjeta [C]    Carcinoma of overlapping sites of left breast in female, estrogen receptor negative ([place])     INTERVAL HISTORY: Patient a poor historian given dementia.  Patient is accompanied by her husband today.  [name] 79 y.o.  female pleasant patient above history of metastatic breast cancer on Herceptin  plus Perjeta-is here for follow-up/review results of the CT scan.  As per the husband patient continues to have chronic back pain.  No new shortness of breath swelling in the legs.  No falls.  Continues to have dryness of the eyes.   Review of Systems  Unable to perform ROS: Dementia    PAST MEDICAL HISTORY :  Past Medical History:  Diagnosis Date  . Arthritis   . 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 Breast cancer ([place]) 2018   left breast; surgery 01/11/15 with Dr. Bary Castilla; path with invasive mammary and DCIS  . Breast cancer of upper-inner quadrant of left female breast ([place]) 12/15/14   Completed radiation end of December and finished chemotherapy 2 weeks ago, Left breast invasive mammary carcinoma, T1cN19mc (1.5 cm); Grade 3, [name], [name], [name], .  .Marland KitchenCataract    bilat   . DDD (degenerative disc disease), lumbar    Lumbar, previously evaluated by Dr. HEarnestine Leys . [place]   . Fibrocystic breast disease    prior biopsy  . Glaucoma   . 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 Hearing loss   . History of cancer chemotherapy   . History of radiation therapy   . Hyperlipidemia, unspecified   . Imbalance   . Memory impairment    seen by Dr SManuella Ghazi possible post crainiotomy from radiation  . [place]    [facility] cavern[facility]  . Numbness and tingling    right hand   . Osteoarthritis   .  Osteoporosis, post-menopausal   . Personal history of chemotherapy   . Personal history of radiation therapy   . Shoulder pain, right   . Wears glasses     PAST SURGICAL HISTORY :   Past Surgical History:  Procedure Laterality Date  . APPENDECTOMY  1950  . BRAIN SURGERY  1995   left frontal/temporal  . BREAST BIOPSY Left 12/15/14   confirmed DCIS  . BREAST BIOPSY Left 01/08/2015   Procedure: BREAST BIOPSY WITH NEEDLE LOCALIZATION;  Surgeon: [name], MD;  Location: [facility] ORS;  Service: General;  Laterality: Left;  . BREAST EXCISIONAL BIOPSY Left 1997  . BREAST LUMPECTOMY Left 01/08/2015   Procedure: LUMPECTOMY;  Surgeon: [name], MD;  Location: [facility];  Service: General;  Laterality: Left;  . COLONOSCOPY  2010   Dr. [name]  . ORIF ANKLE FRACTURE Right 08/05/2017   Procedure: OPEN REDUCTION INTERNAL FIXATION (ORIF) ANKLE FRACTURE;  Surgeon: Earnestine Leys, MD;  Location: [facility];  Service: Orthopedics;  Laterality: Right;  . PORTACATH PLACEMENT Right 01/16/2015   Procedure: INSERTION PORT-A-CATH;  Surgeon: [name], MD;  Location: [facility];  Service: General;  Laterality: Right;  . SENTINEL NODE BIOPSY Left 01/16/2015   Procedure: SENTINEL NODE BIOPSY;  Surgeon: [name], MD;  Location: [facility];  Service: General;  Laterality: Left;  . TOTAL HIP ARTHROPLASTY Right 09/04/2015   Procedure: RIGHT TOTAL HIP ARTHROPLASTY ANTERIOR APPROACH;  Surgeon: [name], MD;  [facility];  Service: Orthopedics;  Laterality: Right;    FAMILY HISTORY :   Family History  Problem Relation Age of Onset  . Breast cancer Sister 99  . Addison's disease Mother   . Hyperthyroidism Mother   . Osteoarthritis Mother   . Stroke Father   . Heart disease Father   . High blood pressure Father     SOCIAL HISTORY:   Social History   Tobacco Use  . Smoking status: Former Smoker    Packs/day: 0.25    Years: 5.00    Pack years: 1.25    Types: Cigarettes    Quit date: 07/28/1972    Years since quitting: 47.9  . Smokeless tobacco: Never Used  Vaping Use  . Vaping Use: Never used  Substance Use Topics  . Alcohol use: Yes    Alcohol/week: 1.0 - 2.0 standard drink    Types: 1 - 2 Glasses of wine per week    Comment: 1 Glass Wine / Night  . Drug use: No    ALLERGIES:  is allergic to donepezil.  MEDICATIONS:  Current Outpatient Medications  Medication Sig Dispense Refill  . acetaminophen (TYLENOL) 325 MG tablet  Take 650 mg by mouth every 4 (four) hours as needed. Pain / increased temp.    . ARTIFICIAL TEARS 1 % ophthalmic solution Place 1 drop into both eyes daily.    Marland Kitchen ascorbic acid (VITAMIN C) 500 MG tablet Take 1 tablet by mouth daily.    . Calcium Carbonate-Vitamin D (CALTRATE 600+D PO) Take 1 tablet by mouth daily.    . celecoxib (CELEBREX) 200 MG capsule Take 200 mg by mouth daily.     . Cetirizine HCl (ZYRTEC ALLERGY PO) Take 1 tablet by mouth as needed (allergies).    . chlorhexidine (PERIDEX) 0.12 % solution 15 mLs 2 (two) times daily.    Marland Kitchen levETIRAcetam (KEPPRA) 250 MG tablet 1 tablet daily at bedtime  5  . traMADol (ULTRAM) 50 MG tablet Take 1 tablet (50 mg total)  by mouth 2 (two) times daily as needed for moderate pain. 60 tablet 0  . Travoprost, BAK Free, (TRAVATAN) 0.004 % SOLN ophthalmic solution travoprost 0.004 % eye drops    . vitamin E 400 UNIT capsule Take 400 Units by mouth daily.      No current facility-administered medications for this visit.   Facility-Administered Medications Ordered in Other Visits  Medication Dose Route Frequency Provider Last Rate Last Admin  . sodium chloride flush (NS) 0.9 % injection 10 mL  10 mL Intravenous PRN Cammie Sickle, MD   10 mL at 07/07/18 0830    PHYSICAL EXAMINATION: ECOG PERFORMANCE STATUS: 2 - Symptomatic, <50% confined to bed  BP 112/62 (BP Location: Right Arm, Patient Position: Sitting, Cuff Size: Normal)   Pulse 71   Temp (!) 97 F (36.1 C) (Tympanic)   Resp 16   Ht _0  (1.651 m)   Wt 175 lb (79.4 kg)   SpO2 98%   BMI 29.12 kg/m   Filed Weights   07/10/20 0845  Weight: 175 lb (79.4 kg)     Physical Exam Constitutional:      Comments: Patient is accompanied by husband.  In a wheelchair.  HENT:     Head: Normocephalic and atraumatic.     Mouth/Throat:     Pharynx: No oropharyngeal exudate.  Eyes:     Pupils: Pupils are equal, round, and reactive to light.  Cardiovascular:     Rate and Rhythm: Normal  rate and regular rhythm.  Pulmonary:     Effort: Pulmonary effort is normal. No respiratory distress.     Breath sounds: Normal breath sounds. No wheezing.  Abdominal:     General: Bowel sounds are normal. There is no distension.     Palpations: Abdomen is soft. There is no mass.     Tenderness: There is no abdominal tenderness. There is no guarding or rebound.  Musculoskeletal:        General: No tenderness. Normal range of motion.     Cervical back: Normal range of motion and neck supple.  Skin:    General: Skin is warm.  Neurological:     Mental Status: She is alert and oriented to person, place, and time.  Psychiatric:        Mood and Affect: Affect normal.     LABORATORY DATA:  I have reviewed the data as listed    Component Value Date/Time   NA 135 07/10/2020 0826   K 3.6 07/10/2020 0826   CL 98 07/10/2020 0826   CO2 26 07/10/2020 0826   GLUCOSE 183 (H) 07/10/2020 0826   BUN 18 07/10/2020 0826   CREATININE 0.78 07/10/2020 0826   CALCIUM 8.4 (L) 07/10/2020 0826   PROT 6.9 07/10/2020 0826   ALBUMIN 3.9 07/10/2020 0826   AST 24 07/10/2020 0826   ALT 17 07/10/2020 0826   ALKPHOS 49 07/10/2020 0826   BILITOT 0.6 07/10/2020 0826   GFRNONAA >60 07/10/2020 0826   GFRAA >60 04/16/2020 0814    No results found for: SPEP, UPEP  Lab Results  Component Value Date   WBC 6.1 07/10/2020   NEUTROABS 4.4 07/10/2020   HGB 13.5 07/10/2020   HCT 41.9 07/10/2020   MCV 92.7 07/10/2020   PLT 302 07/10/2020      Chemistry      Component Value Date/Time   NA 135 07/10/2020 0826   K 3.6 07/10/2020 0826   CL 98 07/10/2020 0826   CO2 26 07/10/2020 0826  BUN 18 07/10/2020 0826   CREATININE 0.78 07/10/2020 0826      Component Value Date/Time   CALCIUM 8.4 (L) 07/10/2020 0826   ALKPHOS 49 07/10/2020 0826   AST 24 07/10/2020 0826   ALT 17 07/10/2020 0826   BILITOT 0.6 07/10/2020 0826       RADIOGRAPHIC STUDIES: I have personally reviewed the radiological images as  listed and agreed with the findings in the report. No results found.   ASSESSMENT & PLAN:  Carcinoma of overlapping sites of left breast in female, estrogen receptor negative ([place]) #Metastatic breast cancer ER [name] HER-2/neu positive. DEC [id number]- CT scan chest and pelvis NED- STABLE.stable sclerosis of the left [place]..   # Proceed with Herceptin Perjeta- Labs today reviewed;  acceptable for treatment today.Diiscussed chemo holiday.   # Drop in ejection fraction-June 14 MUGA scan ejection fraction 47%; AUG 2021- MUGA scan- 61%. NOV 2021- EF-55%.   # dry eyes/ keratitis sicca- *herceptin-perjeta; not sure if casutive factor.  Continue moisturizing eyedrops.  Stable.    # Chronic back and hip pain/ gait instability-stable.  On asper patch/ tramadol prn.    DISPOSITION:  #Herceptin-Perjeta today # Follow up-in 9 weeks  labs- labs-cbc/cmp;  Herceptin-Perjeta; prior- Dr.B  # I reviewed the blood work- with the patient in detail; also reviewed the imaging independently [as summarized above]; and with the patient in detail.      Orders Placed This Encounter  Procedures  . CBC with Differential    Standing Status:   Future    Standing Expiration Date:   07/10/2021  . Comprehensive metabolic panel    Standing Status:   Future    Standing Expiration Date:   07/10/2021   All questions were answered. The patient knows to call the clinic with any problems, questions or concerns.      Cammie Sickle, MD 07/10/2020 9:22 PM

## 2020-09-07 NOTE — Progress Notes (Signed)
Last Herceptin/perjeta on 12/14.  Will change to reload doses for visit on 2/15.  Doses can be given over 27min since <6months since last dose.

## 2020-09-11 ENCOUNTER — Inpatient Hospital Stay: Payer: Medicare Other | Attending: Internal Medicine

## 2020-09-11 ENCOUNTER — Encounter: Payer: Self-pay | Admitting: Internal Medicine

## 2020-09-11 ENCOUNTER — Other Ambulatory Visit: Payer: Self-pay

## 2020-09-11 ENCOUNTER — Inpatient Hospital Stay: Payer: Medicare Other

## 2020-09-11 ENCOUNTER — Inpatient Hospital Stay (HOSPITAL_BASED_OUTPATIENT_CLINIC_OR_DEPARTMENT_OTHER): Payer: Medicare Other | Admitting: Internal Medicine

## 2020-09-11 DIAGNOSIS — H5789 Other specified disorders of eye and adnexa: Secondary | ICD-10-CM | POA: Insufficient documentation

## 2020-09-11 DIAGNOSIS — F0390 Unspecified dementia without behavioral disturbance: Secondary | ICD-10-CM | POA: Insufficient documentation

## 2020-09-11 DIAGNOSIS — R2689 Other abnormalities of gait and mobility: Secondary | ICD-10-CM | POA: Diagnosis not present

## 2020-09-11 DIAGNOSIS — C50812 Malignant neoplasm of overlapping sites of left female breast: Secondary | ICD-10-CM | POA: Diagnosis present

## 2020-09-11 DIAGNOSIS — R6889 Other general symptoms and signs: Secondary | ICD-10-CM | POA: Diagnosis not present

## 2020-09-11 DIAGNOSIS — Z171 Estrogen receptor negative status [ER-]: Secondary | ICD-10-CM

## 2020-09-11 DIAGNOSIS — G8929 Other chronic pain: Secondary | ICD-10-CM | POA: Insufficient documentation

## 2020-09-11 DIAGNOSIS — Z5112 Encounter for antineoplastic immunotherapy: Secondary | ICD-10-CM | POA: Diagnosis not present

## 2020-09-11 DIAGNOSIS — M25559 Pain in unspecified hip: Secondary | ICD-10-CM | POA: Insufficient documentation

## 2020-09-11 DIAGNOSIS — M3501 Sicca syndrome with keratoconjunctivitis: Secondary | ICD-10-CM | POA: Insufficient documentation

## 2020-09-11 LAB — CBC WITH DIFFERENTIAL/PLATELET
Abs Immature Granulocytes: 0.02 10*3/uL (ref 0.00–0.07)
Basophils Absolute: 0 10*3/uL (ref 0.0–0.1)
Basophils Relative: 1 %
Eosinophils Absolute: 0.1 10*3/uL (ref 0.0–0.5)
Eosinophils Relative: 2 %
HCT: 43.6 % (ref 36.0–46.0)
Hemoglobin: 14.5 g/dL (ref 12.0–15.0)
Immature Granulocytes: 0 %
Lymphocytes Relative: 17 %
Lymphs Abs: 1.1 10*3/uL (ref 0.7–4.0)
MCH: 30.4 pg (ref 26.0–34.0)
MCHC: 33.3 g/dL (ref 30.0–36.0)
MCV: 91.4 fL (ref 80.0–100.0)
Monocytes Absolute: 0.4 10*3/uL (ref 0.1–1.0)
Monocytes Relative: 6 %
Neutro Abs: 4.9 10*3/uL (ref 1.7–7.7)
Neutrophils Relative %: 74 %
Platelets: 283 10*3/uL (ref 150–400)
RBC: 4.77 MIL/uL (ref 3.87–5.11)
RDW: 13 % (ref 11.5–15.5)
WBC: 6.5 10*3/uL (ref 4.0–10.5)
nRBC: 0 % (ref 0.0–0.2)

## 2020-09-11 LAB — COMPREHENSIVE METABOLIC PANEL
ALT: 20 U/L (ref 0–44)
AST: 26 U/L (ref 15–41)
Albumin: 4.1 g/dL (ref 3.5–5.0)
Alkaline Phosphatase: 52 U/L (ref 38–126)
Anion gap: 12 (ref 5–15)
BUN: 17 mg/dL (ref 8–23)
CO2: 26 mmol/L (ref 22–32)
Calcium: 8.5 mg/dL — ABNORMAL LOW (ref 8.9–10.3)
Chloride: 99 mmol/L (ref 98–111)
Creatinine, Ser: 0.83 mg/dL (ref 0.44–1.00)
GFR, Estimated: >60 mL/min (ref >60)
Glucose, Bld: 184 mg/dL — ABNORMAL HIGH (ref 70–99)
Potassium: 3.6 mmol/L (ref 3.5–5.1)
Sodium: 137 mmol/L (ref 135–145)
Total Bilirubin: 0.8 mg/dL (ref 0.3–1.2)
Total Protein: 7 g/dL (ref 6.5–8.1)

## 2020-09-11 MED ORDER — ACETAMINOPHEN 325 MG PO TABS
650.0000 mg | ORAL_TABLET | Freq: Once | ORAL | Status: AC
  Administered 2020-09-11: 650 mg via ORAL
  Filled 2020-09-11: qty 2

## 2020-09-11 MED ORDER — SODIUM CHLORIDE 0.9% FLUSH
10.0000 mL | Freq: Once | INTRAVENOUS | Status: AC
  Administered 2020-09-11: 10 mL via INTRAVENOUS
  Filled 2020-09-11: qty 10

## 2020-09-11 MED ORDER — SODIUM CHLORIDE 0.9 % IV SOLN
840.0000 mg | Freq: Once | INTRAVENOUS | Status: AC
  Administered 2020-09-11: 840 mg via INTRAVENOUS
  Filled 2020-09-11: qty 28

## 2020-09-11 MED ORDER — HEPARIN SOD (PORK) LOCK FLUSH 100 UNIT/ML IV SOLN
500.0000 [IU] | Freq: Once | INTRAVENOUS | Status: AC | PRN
  Administered 2020-09-11: 500 [IU]
  Filled 2020-09-11: qty 5

## 2020-09-11 MED ORDER — SODIUM CHLORIDE 0.9 % IV SOLN
Freq: Once | INTRAVENOUS | Status: AC
  Filled 2020-09-11: qty 250

## 2020-09-11 MED ORDER — DIPHENHYDRAMINE HCL 50 MG/ML IJ SOLN
12.5000 mg | Freq: Once | INTRAMUSCULAR | Status: AC
  Administered 2020-09-11: 12.5 mg via INTRAVENOUS
  Filled 2020-09-11: qty 1

## 2020-09-11 MED ORDER — TRASTUZUMAB-DKST CHEMO 150 MG IV SOLR
600.0000 mg | Freq: Once | INTRAVENOUS | Status: AC
  Administered 2020-09-11: 600 mg via INTRAVENOUS
  Filled 2020-09-11: qty 28.57

## 2020-09-11 MED ORDER — HEPARIN SOD (PORK) LOCK FLUSH 100 UNIT/ML IV SOLN
INTRAVENOUS | Status: AC
  Filled 2020-09-11: qty 5

## 2020-09-11 NOTE — Progress Notes (Signed)
[place] OFFICE PROGRESS NOTE  Patient Care Team: [name], MD as PCP - General (Internal Medicine) Bary Castilla, [name], MD (General Surgery) Requested, Self Cammie Sickle, MD as Consulting Physician (Internal Medicine)  Cancer Staging No matching staging information was found for the patient.   Oncology History Overview Note  # June 2016- LEFT BREAST CA;  [name] ; [name] ; [name]; Her 2 Neu POS; [place] from July OF 2016; [name]; adjuvant Herceptin [ Finished July 2017]; AUG 2017- Neratinib x5 days; DISCON sec to diarrhea  # MID OCT 2018- Right breast mass-Bx- ER/PR-NEG; Her 2 NEU POSITIVE 1~2.5cm;  [?NEW primary]  # MID-OCT 2018-METASTATIC RECURRENT-oh sternal mass; Left Ax LN [Bx]/periportal LN  # OCT 25th 2018- TAXOL-HERCEPTIN-PERJETA; Jan 2019- CT PR; continue HP only; on HOLD since NOV 2019--sec to drop in EF/Sieziues  # July 15th 2020- RE-STARTED HP   -------------------------------------------------------------------  # MUGA scan- July 28th 2017- 67%.  December 2019-EF 53%; January 2020 EF-52%  # chronic gait/balance issues  # Seizures/petit-mal/ Dr.Shah-Keppra [name] [id number]]  # June 2017- left breast Bx- fat necrosis [[name]  # july 2017-  BRCA 1& 2- NEG.   MOLECULAR TESTING- F ONE- TPS- 0%;  ERB2 amplification; PI3K/RET amplification Others**  # PALLIATIVE CARE: P  --------------------------------------------------    DIAGNOSIS: [ OCT 7616]- REC/MET- BREAST CA ER/PR-NEG; her 2 POS  STAGE: 4  ;GOALS: Palliative  CURRENT/MOST RECENT THERAPY- Herceptin-Perjeta [C]    Carcinoma of overlapping sites of left breast in female, estrogen receptor negative ([place])     INTERVAL HISTORY: Patient a poor historian given dementia.  Patient is accompanied by her husband today.  [name] 80 y.o.  female pleasant patient above history of metastatic breast cancer on Herceptin  plus Perjeta-is here for follow-up.  Patient continues to have complaints of dry eyes; itchy eyes.  Has been using moisturizing lotion.  Denies any worsening back pain.  No new swelling in the legs.  No new shortness of breath or cough.  No falls.   Review of Systems  Unable to perform ROS: Dementia    PAST MEDICAL HISTORY :  Past Medical History:  Diagnosis Date  . Arthritis   . 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 Breast cancer ([name]) 2018   left breast; surgery 01/11/15 with Dr. Bary Castilla; path with invasive mammary and DCIS  . Breast cancer of upper-inner quadrant of left female breast ([place]) 12/15/14   Completed radiation end of December and finished chemotherapy 2 weeks ago, Left breast invasive mammary carcinoma, T1cN75mc (1.5 cm); Grade 3, [name], [name], [name], .  .Marland KitchenCataract    bilat   . DDD (degenerative disc disease), lumbar    Lumbar, previously evaluated by Dr. HEarnestine Leys . Dementia ([place]   . Fibrocystic breast disease    prior biopsy  . Glaucoma   . 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 Hearing loss   . History of cancer chemotherapy   . History of radiation therapy   . Hyperlipidemia, unspecified   . Imbalance   . Memory impairment    seen by Dr SManuella Ghazi possible post crainiotomy from radiation  . Meningioma ([place]    [facility] cavern[facility]  . Numbness and tingling    right hand   .  Osteoarthritis   . Osteoporosis, post-menopausal   . Personal history of chemotherapy   . Personal history of radiation therapy   . Shoulder pain, right   . Wears glasses     PAST SURGICAL HISTORY :   Past Surgical History:  Procedure Laterality Date  . APPENDECTOMY  1950  . BRAIN SURGERY  1995   left frontal/temporal  . BREAST BIOPSY Left 12/15/14   confirmed DCIS  . BREAST BIOPSY Left 01/08/2015   Procedure: BREAST BIOPSY WITH NEEDLE LOCALIZATION;   Surgeon: [name], MD;  Location: [facility] ORS;  Service: General;  Laterality: Left;  . BREAST EXCISIONAL BIOPSY Left 1997  . BREAST LUMPECTOMY Left 01/08/2015   Procedure: LUMPECTOMY;  Surgeon: [name], MD;  Location: [facility];  Service: General;  Laterality: Left;  . COLONOSCOPY  2010   Dr. [name]  . ORIF ANKLE FRACTURE Right 08/05/2017   Procedure: OPEN REDUCTION INTERNAL FIXATION (ORIF) ANKLE FRACTURE;  Surgeon: Earnestine Leys, MD;  Location: [facility];  Service: Orthopedics;  Laterality: Right;  . PORTACATH PLACEMENT Right 01/16/2015   Procedure: INSERTION PORT-A-CATH;  Surgeon: [name], MD;  Location: [facility];  Service: General;  Laterality: Right;  . SENTINEL NODE BIOPSY Left 01/16/2015   Procedure: SENTINEL NODE BIOPSY;  Surgeon: [name], MD;  Location: [facility];  Service: General;  Laterality: Left;  . TOTAL HIP ARTHROPLASTY Right 09/04/2015   Procedure: RIGHT TOTAL HIP ARTHROPLASTY ANTERIOR APPROACH;  Surgeon: [name], MD;  Location: [facility];  Service: Orthopedics;  Laterality: Right;    FAMILY HISTORY :   Family History  Problem Relation Age of Onset  . Breast cancer Sister 77  . Addison's disease Mother   . Hyperthyroidism Mother   . Osteoarthritis Mother   . Stroke Father   . Heart disease Father   . High blood pressure Father     SOCIAL HISTORY:   Social History   Tobacco Use  . Smoking status: Former Smoker    Packs/day: 0.25    Years: 5.00    Pack years: 1.25    Types: Cigarettes    Quit date: 07/28/1972    Years since quitting: 48.1  . Smokeless tobacco: Never Used  Vaping Use  . Vaping Use: Never used  Substance Use Topics  . Alcohol use: Yes    Alcohol/week: 1.0 - 2.0 standard drink    Types: 1 - 2 Glasses of wine per week    Comment: 1 Glass Wine / Night  . Drug use: No    ALLERGIES:  is allergic to donepezil.  MEDICATIONS:  Current Outpatient Medications  Medication Sig Dispense Refill  . acetaminophen  (TYLENOL) 325 MG tablet Take 650 mg by mouth every 4 (four) hours as needed. Pain / increased temp.    . ARTIFICIAL TEARS 1 % ophthalmic solution Place 1 drop into both eyes daily.    Marland Kitchen ascorbic acid (VITAMIN C) 500 MG tablet Take 1 tablet by mouth daily.    . Calcium Carbonate-Vitamin D (CALTRATE 600+D PO) Take 1 tablet by mouth daily.    . celecoxib (CELEBREX) 200 MG capsule Take 200 mg by mouth daily.     . Cetirizine HCl (ZYRTEC ALLERGY PO) Take 1 tablet by mouth as needed (allergies).    . chlorhexidine (PERIDEX) 0.12 % solution 15 mLs 2 (two) times daily.    Marland Kitchen levETIRAcetam (KEPPRA) 250 MG tablet 1 tablet daily at bedtime  5  . traMADol (ULTRAM) 50 MG tablet Take 1  tablet (50 mg total) by mouth 2 (two) times daily as needed for moderate pain. 60 tablet 0  . Travoprost, BAK Free, (TRAVATAN) 0.004 % SOLN ophthalmic solution travoprost 0.004 % eye drops    . vitamin E 400 UNIT capsule Take 400 Units by mouth daily.      No current facility-administered medications for this visit.   Facility-Administered Medications Ordered in Other Visits  Medication Dose Route Frequency Provider Last Rate Last Admin  . sodium chloride flush (NS) 0.9 % injection 10 mL  10 mL Intravenous PRN Cammie Sickle, MD   10 mL at 07/07/18 0830    PHYSICAL EXAMINATION: ECOG PERFORMANCE STATUS: 2 - Symptomatic, <50% confined to bed  BP 138/70   Pulse 80   Temp 97.9 F (36.6 C) (Tympanic)   Resp 20   Ht 5' 5" (1.651 m)   Wt 178 lb (80.7 kg)   BMI 29.62 kg/m   Filed Weights   09/11/20 0854  Weight: 178 lb (80.7 kg)     Physical Exam Constitutional:      Comments: Patient is accompanied by husband.  In a wheelchair.  HENT:     Head: Normocephalic and atraumatic.     Mouth/Throat:     Pharynx: No oropharyngeal exudate.  Eyes:     Pupils: Pupils are equal, round, and reactive to light.  Cardiovascular:     Rate and Rhythm: Normal rate and regular rhythm.  Pulmonary:     Effort: Pulmonary  effort is normal. No respiratory distress.     Breath sounds: Normal breath sounds. No wheezing.  Abdominal:     General: Bowel sounds are normal. There is no distension.     Palpations: Abdomen is soft. There is no mass.     Tenderness: There is no abdominal tenderness. There is no guarding or rebound.  Musculoskeletal:        General: No tenderness. Normal range of motion.     Cervical back: Normal range of motion and neck supple.  Skin:    General: Skin is warm.  Neurological:     Mental Status: She is alert and oriented to person, place, and time.  Psychiatric:        Mood and Affect: Affect normal.     LABORATORY DATA:  I have reviewed the data as listed    Component Value Date/Time   NA 137 09/11/2020 0838   K 3.6 09/11/2020 0838   CL 99 09/11/2020 0838   CO2 26 09/11/2020 0838   GLUCOSE 184 (H) 09/11/2020 0838   BUN 17 09/11/2020 0838   CREATININE 0.83 09/11/2020 0838   CALCIUM 8.5 (L) 09/11/2020 0838   PROT 7.0 09/11/2020 0838   ALBUMIN 4.1 09/11/2020 0838   AST 26 09/11/2020 0838   ALT 20 09/11/2020 0838   ALKPHOS 52 09/11/2020 0838   BILITOT 0.8 09/11/2020 0838   GFRNONAA >60 09/11/2020 0838   GFRAA >60 04/16/2020 0814    No results found for: SPEP, UPEP  Lab Results  Component Value Date   WBC 6.5 09/11/2020   NEUTROABS 4.9 09/11/2020   HGB 14.5 09/11/2020   HCT 43.6 09/11/2020   MCV 91.4 09/11/2020   PLT 283 09/11/2020      Chemistry      Component Value Date/Time   NA 137 09/11/2020 0838   K 3.6 09/11/2020 0838   CL 99 09/11/2020 0838   CO2 26 09/11/2020 0838   BUN 17 09/11/2020 0838   CREATININE 0.83 09/11/2020 1572  Component Value Date/Time   CALCIUM 8.5 (L) 09/11/2020 0838   ALKPHOS 52 09/11/2020 0838   AST 26 09/11/2020 0838   ALT 20 09/11/2020 0838   BILITOT 0.8 09/11/2020 0838       RADIOGRAPHIC STUDIES: I have personally reviewed the radiological images as listed and agreed with the findings in the report. No results  found.   ASSESSMENT & PLAN:  Carcinoma of overlapping sites of left breast in female, estrogen receptor negative ([place]) #Metastatic breast cancer ER [name] HER-2/neu positive. DEC [id number]- CT scan chest and pelvis NED- STABLE:  sclerosis of the manubrium.  Chemo holiday for 2 months.  Restart treatment.  # proceed with Herceptin-perjeta. Labs today reviewed;  acceptable for treatment today.   # Drop in ejection fraction-June 14 MUGA scan ejection fraction 47%; AUG 2021- MUGA scan- 61%. NOV 2021- EF-55%.    # dry eyes/ keratitis sicca- *herceptin-perjeta; not sure if casutive factor.  Continue moisturizing eyedrops.  Stable.    # Chronic back and hip pain/ gait instability-stable.  On asper patch/ tramadol prn.    DISPOSITION:  #Herceptin-Perjeta today # Follow up-in 3 weeks  labs- labs-cbc/cmp;  Herceptin-Perjeta; Dr.B     No orders of the defined types were placed in this encounter.  All questions were answered. The patient knows to call the clinic with any problems, questions or concerns.      Cammie Sickle, MD 09/11/2020 1:06 PM

## 2020-09-11 NOTE — Progress Notes (Signed)
Pt received herceptin/perjeta infusion in clinic today. Tolerated well. No complaints at d/c.

## 2020-09-11 NOTE — Assessment & Plan Note (Addendum)
#  Metastatic breast cancer ER [name] HER-2/neu positive. DEC [id number]- CT scan chest and pelvis NED- STABLE:  sclerosis of the manubrium.  Chemo holiday for 2 months.  Restart treatment.  # proceed with Herceptin-perjeta. Labs today reviewed;  acceptable for treatment today.   # Drop in ejection fraction-June 14 MUGA scan ejection fraction 47%; AUG 2021- MUGA scan- 61%. NOV 2021- EF-55%.    # dry eyes/ keratitis sicca- *herceptin-perjeta; not sure if casutive factor.  Continue moisturizing eyedrops.  Stable.    # Chronic back and hip pain/ gait instability-stable.  On asper patch/ tramadol prn.    DISPOSITION:  #Herceptin-Perjeta today # Follow up-in 3 weeks  labs- labs-cbc/cmp;  Herceptin-Perjeta; Dr.B

## 2020-09-28 ENCOUNTER — Other Ambulatory Visit: Payer: Self-pay | Admitting: *Deleted

## 2020-09-28 DIAGNOSIS — Z171 Estrogen receptor negative status [ER-]: Secondary | ICD-10-CM

## 2020-09-28 DIAGNOSIS — C50812 Malignant neoplasm of overlapping sites of left female breast: Secondary | ICD-10-CM

## 2020-10-02 ENCOUNTER — Other Ambulatory Visit: Payer: Self-pay

## 2020-10-02 ENCOUNTER — Encounter: Payer: Self-pay | Admitting: Internal Medicine

## 2020-10-02 ENCOUNTER — Inpatient Hospital Stay: Payer: Medicare Other

## 2020-10-02 ENCOUNTER — Inpatient Hospital Stay: Payer: Medicare Other | Attending: Internal Medicine

## 2020-10-02 ENCOUNTER — Inpatient Hospital Stay (HOSPITAL_BASED_OUTPATIENT_CLINIC_OR_DEPARTMENT_OTHER): Payer: Medicare Other | Admitting: Internal Medicine

## 2020-10-02 DIAGNOSIS — M549 Dorsalgia, unspecified: Secondary | ICD-10-CM | POA: Insufficient documentation

## 2020-10-02 DIAGNOSIS — F0390 Unspecified dementia without behavioral disturbance: Secondary | ICD-10-CM | POA: Insufficient documentation

## 2020-10-02 DIAGNOSIS — R2689 Other abnormalities of gait and mobility: Secondary | ICD-10-CM | POA: Diagnosis not present

## 2020-10-02 DIAGNOSIS — Z171 Estrogen receptor negative status [ER-]: Secondary | ICD-10-CM | POA: Diagnosis not present

## 2020-10-02 DIAGNOSIS — C50812 Malignant neoplasm of overlapping sites of left female breast: Secondary | ICD-10-CM | POA: Insufficient documentation

## 2020-10-02 DIAGNOSIS — M25559 Pain in unspecified hip: Secondary | ICD-10-CM | POA: Insufficient documentation

## 2020-10-02 DIAGNOSIS — H5789 Other specified disorders of eye and adnexa: Secondary | ICD-10-CM | POA: Insufficient documentation

## 2020-10-02 DIAGNOSIS — G8929 Other chronic pain: Secondary | ICD-10-CM | POA: Insufficient documentation

## 2020-10-02 DIAGNOSIS — Z5112 Encounter for antineoplastic immunotherapy: Secondary | ICD-10-CM | POA: Insufficient documentation

## 2020-10-02 LAB — CBC WITH DIFFERENTIAL/PLATELET
Abs Immature Granulocytes: 0.02 10*3/uL (ref 0.00–0.07)
Basophils Absolute: 0 10*3/uL (ref 0.0–0.1)
Basophils Relative: 0 %
Eosinophils Absolute: 0.1 10*3/uL (ref 0.0–0.5)
Eosinophils Relative: 2 %
HCT: 42.7 % (ref 36.0–46.0)
Hemoglobin: 13.6 g/dL (ref 12.0–15.0)
Immature Granulocytes: 0 %
Lymphocytes Relative: 15 %
Lymphs Abs: 1 10*3/uL (ref 0.7–4.0)
MCH: 29.8 pg (ref 26.0–34.0)
MCHC: 31.9 g/dL (ref 30.0–36.0)
MCV: 93.4 fL (ref 80.0–100.0)
Monocytes Absolute: 0.4 10*3/uL (ref 0.1–1.0)
Monocytes Relative: 6 %
Neutro Abs: 5.1 10*3/uL (ref 1.7–7.7)
Neutrophils Relative %: 77 %
Platelets: 270 10*3/uL (ref 150–400)
RBC: 4.57 MIL/uL (ref 3.87–5.11)
RDW: 13.2 % (ref 11.5–15.5)
WBC: 6.7 10*3/uL (ref 4.0–10.5)
nRBC: 0 % (ref 0.0–0.2)

## 2020-10-02 LAB — COMPREHENSIVE METABOLIC PANEL
ALT: 17 U/L (ref 0–44)
AST: 25 U/L (ref 15–41)
Albumin: 3.9 g/dL (ref 3.5–5.0)
Alkaline Phosphatase: 50 U/L (ref 38–126)
Anion gap: 10 (ref 5–15)
BUN: 14 mg/dL (ref 8–23)
CO2: 28 mmol/L (ref 22–32)
Calcium: 8.6 mg/dL — ABNORMAL LOW (ref 8.9–10.3)
Chloride: 100 mmol/L (ref 98–111)
Creatinine, Ser: 0.83 mg/dL (ref 0.44–1.00)
GFR, Estimated: >60 mL/min (ref >60)
Glucose, Bld: 155 mg/dL — ABNORMAL HIGH (ref 70–99)
Potassium: 3.5 mmol/L (ref 3.5–5.1)
Sodium: 138 mmol/L (ref 135–145)
Total Bilirubin: 0.6 mg/dL (ref 0.3–1.2)
Total Protein: 6.9 g/dL (ref 6.5–8.1)

## 2020-10-02 MED ORDER — TRASTUZUMAB-ANNS CHEMO 150 MG IV SOLR
450.0000 mg | Freq: Once | INTRAVENOUS | Status: AC
  Administered 2020-10-02: 450 mg via INTRAVENOUS
  Filled 2020-10-02: qty 21.43

## 2020-10-02 MED ORDER — HEPARIN SOD (PORK) LOCK FLUSH 100 UNIT/ML IV SOLN
INTRAVENOUS | Status: AC
  Filled 2020-10-02: qty 5

## 2020-10-02 MED ORDER — SODIUM CHLORIDE 0.9 % IV SOLN
420.0000 mg | Freq: Once | INTRAVENOUS | Status: AC
  Administered 2020-10-02: 420 mg via INTRAVENOUS
  Filled 2020-10-02: qty 14

## 2020-10-02 MED ORDER — ACETAMINOPHEN 325 MG PO TABS
650.0000 mg | ORAL_TABLET | Freq: Once | ORAL | Status: AC
Start: 2020-10-02 — End: 2020-10-02
  Administered 2020-10-02: 650 mg via ORAL
  Filled 2020-10-02: qty 2

## 2020-10-02 MED ORDER — DIPHENHYDRAMINE HCL 50 MG/ML IJ SOLN
12.5000 mg | Freq: Once | INTRAMUSCULAR | Status: AC
  Administered 2020-10-02: 12.5 mg via INTRAVENOUS
  Filled 2020-10-02: qty 1

## 2020-10-02 MED ORDER — HEPARIN SOD (PORK) LOCK FLUSH 100 UNIT/ML IV SOLN
500.0000 [IU] | Freq: Once | INTRAVENOUS | Status: AC | PRN
  Administered 2020-10-02: 500 [IU]
  Filled 2020-10-02: qty 5

## 2020-10-02 MED ORDER — SODIUM CHLORIDE 0.9 % IV SOLN
Freq: Once | INTRAVENOUS | Status: AC
  Filled 2020-10-02: qty 250

## 2020-10-02 NOTE — Assessment & Plan Note (Signed)
#  Metastatic breast cancer ER [name] HER-2/neu positive. DEC [id number]- CT scan chest and pelvis NED- STABLE:  sclerosis of the manubrium.    # proceed with Herceptin-perjeta. Labs today reviewed;  acceptable for treatment today. Will scan in April 2022  # Drop in ejection fraction-June 14 MUGA scan ejection fraction 47%; AUG 2021- MUGA scan- 61%. NOV 2021- EF-55% [2 months break- DEC-FEB, 2022]. Scan in April 2022    # dry eyes/ keratitis sicca- *herceptin-perjeta; not sure if casutive factor.  Continue moisturizing eyedrops.  Stable.    # Chronic back and hip pain/ gait instability-stable.  On asper patch/ tramadol prn.    DISPOSITION:  #Herceptin-Perjeta today # Follow up-in 3 weeks  labs- labs-cbc/cmp;  Herceptin-Perjeta; Dr.B

## 2020-10-02 NOTE — Progress Notes (Signed)
[place] OFFICE PROGRESS NOTE  Patient Care Team: [name], MD as PCP - General (Internal Medicine) Bary Castilla, [name], MD (General Surgery) Requested, Self Cammie Sickle, MD as Consulting Physician (Internal Medicine)  Cancer Staging No matching staging information was found for the patient.   Oncology History Overview Note  # June 2016- LEFT BREAST CA;  [name] ; [name] ; [name]; Her 2 Neu POS; [place] from July OF 2016; [name]; adjuvant Herceptin [ Finished July 2017]; AUG 2017- Neratinib x5 days; DISCON sec to diarrhea  # MID OCT 2018- Right breast mass-Bx- ER/PR-NEG; Her 2 NEU POSITIVE 1~2.5cm;  [?NEW primary]  # MID-OCT 2018-METASTATIC RECURRENT-oh sternal mass; Left Ax LN [Bx]/periportal LN  # OCT 25th 2018- TAXOL-HERCEPTIN-PERJETA; Jan 2019- CT PR; continue HP only; on HOLD since NOV 2019--sec to drop in EF/Sieziues  # July 15th 2020- RE-STARTED HP   -------------------------------------------------------------------  # MUGA scan- July 28th 2017- 67%.  December 2019-EF 53%; January 2020 EF-52%  # chronic gait/balance issues  # Seizures/petit-mal/ Dr.Shah-Keppra [name] [id number]]  # June 2017- left breast Bx- fat necrosis [[name]  # july 2017-  BRCA 1& 2- NEG.   MOLECULAR TESTING- F ONE- TPS- 0%;  ERB2 amplification; PI3K/RET amplification Others**  # PALLIATIVE CARE: P  --------------------------------------------------    DIAGNOSIS: [ OCT 1062]- REC/MET- BREAST CA ER/PR-NEG; her 2 POS  STAGE: 4  ;GOALS: Palliative  CURRENT/MOST RECENT THERAPY- Herceptin-Perjeta [C]    Carcinoma of overlapping sites of left breast in female, estrogen receptor negative ([place])     INTERVAL HISTORY: Patient a poor historian given dementia.  Patient is accompanied by her husband today.  [name] 80 y.o.  female pleasant patient above history of metastatic breast cancer on Herceptin  plus Perjeta-is here for follow-up.  Patient denies any new shortness of breath or cough.  No unusual weight gain.  No swelling in the legs.  Chronic back pain.  Continues have chronic dry itchy eyes.  Has been using moisturizing lotion.  Review of Systems  Unable to perform ROS: Dementia    PAST MEDICAL HISTORY :  Past Medical History:  Diagnosis Date  . Arthritis   . 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 Breast cancer ([place]) 2018   left breast; surgery 01/11/15 with Dr. Bary Castilla; path with invasive mammary and DCIS  . Breast cancer of upper-inner quadrant of left female breast ([place]) 12/15/14   Completed radiation end of December and finished chemotherapy 2 weeks ago, Left breast invasive mammary carcinoma, T1cN88mc (1.5 cm); Grade 3, [name], [name], [name], .  .Marland KitchenCataract    bilat   . DDD (degenerative disc disease), lumbar    Lumbar, previously evaluated by Dr. HEarnestine Leys . Dementia ([place]   . Fibrocystic breast disease    prior biopsy  . Glaucoma   . 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 Hearing loss   . History of cancer chemotherapy   . History of radiation therapy   . Hyperlipidemia, unspecified   . Imbalance   . Memory impairment    seen by Dr SManuella Ghazi possible post crainiotomy from radiation  . Meningioma ([place]    [facility]  . Numbness and tingling    right hand   . Osteoarthritis   .  Osteoporosis, post-menopausal   . Personal history of chemotherapy   . Personal history of radiation therapy   . Shoulder pain, right   . Wears glasses     PAST SURGICAL HISTORY :   Past Surgical History:  Procedure Laterality Date  . APPENDECTOMY  1950  . BRAIN SURGERY  1995   left frontal/temporal  . BREAST BIOPSY Left 12/15/14   confirmed DCIS  . BREAST BIOPSY Left 01/08/2015   Procedure: BREAST BIOPSY WITH NEEDLE LOCALIZATION;  Surgeon:  [name], MD;  Location: [facility];  Service: General;  Laterality: Left;  . BREAST EXCISIONAL BIOPSY Left 1997  . BREAST LUMPECTOMY Left 01/08/2015   Procedure: LUMPECTOMY;  Surgeon: [name], MD;  Location: [facility];  Service: General;  Laterality: Left;  . COLONOSCOPY  2010   Dr. [name]  . ORIF ANKLE FRACTURE Right 08/05/2017   Procedure: OPEN REDUCTION INTERNAL FIXATION (ORIF) ANKLE FRACTURE;  Surgeon: Earnestine Leys, MD;  Location: [facility];  Service: Orthopedics;  Laterality: Right;  . PORTACATH PLACEMENT Right 01/16/2015   Procedure: INSERTION PORT-A-CATH;  Surgeon: [name], MD;  Location: [facility];  Service: General;  Laterality: Right;  . SENTINEL NODE BIOPSY Left 01/16/2015   Procedure: SENTINEL NODE BIOPSY;  Surgeon: [name], MD;  Location: [facility];  Service: General;  Laterality: Left;  . TOTAL HIP ARTHROPLASTY Right 09/04/2015   Procedure: RIGHT TOTAL HIP ARTHROPLASTY ANTERIOR APPROACH;  Surgeon: [name], MD;  [facility];  Service: Orthopedics;  Laterality: Right;    FAMILY HISTORY :   Family History  Problem Relation Age of Onset  . Breast cancer Sister 53  . Addison's disease Mother   . Hyperthyroidism Mother   . Osteoarthritis Mother   . Stroke Father   . Heart disease Father   . High blood pressure Father     SOCIAL HISTORY:   Social History   Tobacco Use  . Smoking status: Former Smoker    Packs/day: 0.25    Years: 5.00    Pack years: 1.25    Types: Cigarettes    Quit date: 07/28/1972    Years since quitting: 48.2  . Smokeless tobacco: Never Used  Vaping Use  . Vaping Use: Never used  Substance Use Topics  . Alcohol use: Yes    Alcohol/week: 1.0 - 2.0 standard drink    Types: 1 - 2 Glasses of wine per week    Comment: 1 Glass Wine / Night  . Drug use: No    ALLERGIES:  is allergic to donepezil.  MEDICATIONS:  Current Outpatient Medications  Medication Sig Dispense Refill  . acetaminophen (TYLENOL) 325 MG  tablet Take 650 mg by mouth every 4 (four) hours as needed. Pain / increased temp.    . ARTIFICIAL TEARS 1 % ophthalmic solution Place 1 drop into both eyes daily.    Marland Kitchen ascorbic acid (VITAMIN C) 500 MG tablet Take 1 tablet by mouth daily.    . Calcium Carbonate-Vitamin D (CALTRATE 600+D PO) Take 1 tablet by mouth daily.    . celecoxib (CELEBREX) 200 MG capsule Take 200 mg by mouth daily.     . Cetirizine HCl (ZYRTEC ALLERGY PO) Take 1 tablet by mouth as needed (allergies).    Marland Kitchen levETIRAcetam (KEPPRA) 250 MG tablet 1 tablet daily at bedtime  5  . traMADol (ULTRAM) 50 MG tablet Take 1 tablet (50 mg total) by mouth 2 (two) times daily as needed for moderate pain. 60 tablet 0  .  Travoprost, BAK Free, (TRAVATAN) 0.004 % SOLN ophthalmic solution travoprost 0.004 % eye drops    . vitamin E 400 UNIT capsule Take 400 Units by mouth daily.      No current facility-administered medications for this visit.   Facility-Administered Medications Ordered in Other Visits  Medication Dose Route Frequency Provider Last Rate Last Admin  . sodium chloride flush (NS) 0.9 % injection 10 mL  10 mL Intravenous PRN Cammie Sickle, MD   10 mL at 07/07/18 0830    PHYSICAL EXAMINATION: ECOG PERFORMANCE STATUS: 2 - Symptomatic, <50% confined to bed  BP 118/60   Pulse 65   Temp (!) 97.3 F (36.3 C) (Tympanic)   Resp 18   Ht 5' 5"  (1.651 m)   Wt 177 lb 11.2 oz (80.6 kg)   SpO2 100%   BMI 29.57 kg/m   Filed Weights   10/02/20 0859  Weight: 177 lb 11.2 oz (80.6 kg)     Physical Exam Constitutional:      Comments: Patient is accompanied by husband.  In a wheelchair.  HENT:     Head: Normocephalic and atraumatic.     Mouth/Throat:     Pharynx: No oropharyngeal exudate.  Eyes:     Pupils: Pupils are equal, round, and reactive to light.  Cardiovascular:     Rate and Rhythm: Normal rate and regular rhythm.  Pulmonary:     Effort: Pulmonary effort is normal. No respiratory distress.     Breath  sounds: Normal breath sounds. No wheezing.  Abdominal:     General: Bowel sounds are normal. There is no distension.     Palpations: Abdomen is soft. There is no mass.     Tenderness: There is no abdominal tenderness. There is no guarding or rebound.  Musculoskeletal:        General: No tenderness. Normal range of motion.     Cervical back: Normal range of motion and neck supple.  Skin:    General: Skin is warm.  Neurological:     Mental Status: She is alert and oriented to person, place, and time.  Psychiatric:        Mood and Affect: Affect normal.     LABORATORY DATA:  I have reviewed the data as listed    Component Value Date/Time   NA 138 10/02/2020 0844   K 3.5 10/02/2020 0844   CL 100 10/02/2020 0844   CO2 28 10/02/2020 0844   GLUCOSE 155 (H) 10/02/2020 0844   BUN 14 10/02/2020 0844   CREATININE 0.83 10/02/2020 0844   CALCIUM 8.6 (L) 10/02/2020 0844   PROT 6.9 10/02/2020 0844   ALBUMIN 3.9 10/02/2020 0844   AST 25 10/02/2020 0844   ALT 17 10/02/2020 0844   ALKPHOS 50 10/02/2020 0844   BILITOT 0.6 10/02/2020 0844   GFRNONAA >60 10/02/2020 0844   GFRAA >60 04/16/2020 0814    No results found for: SPEP, UPEP  Lab Results  Component Value Date   WBC 6.7 10/02/2020   NEUTROABS 5.1 10/02/2020   HGB 13.6 10/02/2020   HCT 42.7 10/02/2020   MCV 93.4 10/02/2020   PLT 270 10/02/2020      Chemistry      Component Value Date/Time   NA 138 10/02/2020 0844   K 3.5 10/02/2020 0844   CL 100 10/02/2020 0844   CO2 28 10/02/2020 0844   BUN 14 10/02/2020 0844   CREATININE 0.83 10/02/2020 0844      Component Value Date/Time   CALCIUM  8.6 (L) 10/02/2020 0844   ALKPHOS 50 10/02/2020 0844   AST 25 10/02/2020 0844   ALT 17 10/02/2020 0844   BILITOT 0.6 10/02/2020 0844       RADIOGRAPHIC STUDIES: I have personally reviewed the radiological images as listed and agreed with the findings in the report. No results found.   ASSESSMENT & PLAN:  Carcinoma of  overlapping sites of left breast in female, estrogen receptor negative ([place]) #Metastatic breast cancer ER [name] HER-2/neu positive. DEC [id number]- CT scan chest and pelvis NED- STABLE:  sclerosis of the manubrium.    # proceed with Herceptin-perjeta. Labs today reviewed;  acceptable for treatment today. Will scan in April 2022  # Drop in ejection fraction-June 14 MUGA scan ejection fraction 47%; AUG 2021- MUGA scan- 61%. NOV 2021- EF-55% [2 months break- DEC-FEB, 2022]. Scan in April 2022    # dry eyes/ keratitis sicca- *herceptin-perjeta; not sure if casutive factor.  Continue moisturizing eyedrops.  Stable.    # Chronic back and hip pain/ gait instability-stable.  On asper patch/ tramadol prn.    DISPOSITION:  #Herceptin-Perjeta today # Follow up-in 3 weeks  labs- labs-cbc/cmp;  Herceptin-Perjeta; Dr.B     No orders of the defined types were placed in this encounter.  All questions were answered. The patient knows to call the clinic with any problems, questions or concerns.      Cammie Sickle, MD 10/02/2020 9:38 AM

## 2020-10-23 ENCOUNTER — Inpatient Hospital Stay: Payer: Medicare Other

## 2020-10-23 ENCOUNTER — Inpatient Hospital Stay: Payer: Medicare Other | Admitting: Internal Medicine

## 2020-10-23 NOTE — Progress Notes (Deleted)
[place] OFFICE PROGRESS NOTE  Patient Care Team: [name], MD as PCP - General (Internal Medicine) Bary Castilla, [name], MD (General Surgery) Requested, Self Cammie Sickle, MD as Consulting Physician (Internal Medicine)  Cancer Staging No matching staging information was found for the patient.   Oncology History Overview Note  # June 2016- LEFT BREAST CA;  [name] ; [name] ; [name]; Her 2 Neu POS; [place] from July OF 2016; [name]; adjuvant Herceptin [ Finished July 2017]; AUG 2017- Neratinib x5 days; DISCON sec to diarrhea  # MID OCT 2018- Right breast mass-Bx- ER/PR-NEG; Her 2 NEU POSITIVE 1~2.5cm;  [?NEW primary]  # MID-OCT 2018-METASTATIC RECURRENT-oh sternal mass; Left Ax LN [Bx]/periportal LN  # OCT 25th 2018- TAXOL-HERCEPTIN-PERJETA; Jan 2019- CT PR; continue HP only; on HOLD since NOV 2019--sec to drop in EF/Sieziues  # July 15th 2020- RE-STARTED HP   -------------------------------------------------------------------  # MUGA scan- July 28th 2017- 67%.  December 2019-EF 53%; January 2020 EF-52%  # chronic gait/balance issues  # Seizures/petit-mal/ Dr.Shah-Keppra [name] [id number]]  # June 2017- left breast Bx- fat necrosis [[name]  # july 2017-  BRCA 1& 2- NEG.   MOLECULAR TESTING- F ONE- TPS- 0%;  ERB2 amplification; PI3K/RET amplification Others**  # PALLIATIVE CARE: P  --------------------------------------------------    DIAGNOSIS: [ OCT 2595]- REC/MET- BREAST CA ER/PR-NEG; her 2 POS  STAGE: 4  ;GOALS: Palliative  CURRENT/MOST RECENT THERAPY- Herceptin-Perjeta [C]    Carcinoma of overlapping sites of left breast in female, estrogen receptor negative ([place])     INTERVAL HISTORY: Patient a poor historian given dementia.  Patient is accompanied by her husband today.  [name] 80 y.o.  female pleasant patient above history of metastatic breast cancer on Herceptin  plus Perjeta-is here for follow-up.  Patient denies any new shortness of breath or cough.  No unusual weight gain.  No swelling in the legs.  Chronic back pain.  Continues have chronic dry itchy eyes.  Has been using moisturizing lotion.  Review of Systems  Unable to perform ROS: Dementia    PAST MEDICAL HISTORY :  Past Medical History:  Diagnosis Date  . Arthritis   . 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 Breast cancer ([place]) 2018   left breast; surgery 01/11/15 with Dr. Bary Castilla; path with invasive mammary and DCIS  . Breast cancer of upper-inner quadrant of left female breast ([place]) 12/15/14   Completed radiation end of December and finished chemotherapy 2 weeks ago, Left breast invasive mammary carcinoma, T1cN48mc (1.5 cm); Grade 3, [name], [name], [name], .  .Marland KitchenCataract    bilat   . DDD (degenerative disc disease), lumbar    Lumbar, previously evaluated by Dr. [place]arnestine Leys . Dementia [place]   . Fibrocystic breast disease    prior biopsy  . Glaucoma   . 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 Hearing loss   . History of cancer chemotherapy   . History of radiation therapy   . Hyperlipidemia, unspecified   . Imbalance   . Memory impairment    seen by Dr SManuella Ghazi possible post crainiotomy from radiation  . [place]eningioma ([place]    Left cavernous sinus meningioma, [facility]  . Numbness and tingling    right hand   . Osteoarthritis   .  Osteoporosis, post-menopausal   . Personal history of chemotherapy   . Personal history of radiation therapy   . Shoulder pain, right   . Wears glasses     PAST SURGICAL HISTORY :   Past Surgical History:  Procedure Laterality Date  . APPENDECTOMY  1950  . BRAIN SURGERY  1995   left frontal/temporal  . BREAST BIOPSY Left 12/15/14   confirmed DCIS  . BREAST BIOPSY Left 01/08/2015   Procedure: BREAST BIOPSY WITH NEEDLE LOCALIZATION;  Surgeon:  [name], MD;  Location: [facility] ORS;  Service: General;  Laterality: Left;  . BREAST EXCISIONAL BIOPSY Left 1997  . BREAST LUMPECTOMY Left 01/08/2015   Procedure: LUMPECTOMY;  Surgeon: [name], MD;  Location: [facility] OR[facility];  Service: General;  Laterality: Left;  . COLONOSCOPY  2010   Dr. [name]  . ORIF ANKLE FRACTURE Right 08/05/2017   Procedure: OPEN REDUCTION INTERNAL FIXATION (ORIF) ANKLE FRACTURE;  Surgeon: Earnestine Leys, MD;  Location: [facility];  Service: Orthopedics;  Laterality: Right;  . PORTACATH PLACEMENT Right 01/16/2015   Procedure: INSERTION PORT-A-CATH;  Surgeon: [name], MD;  Location: [facility];  Service: General;  Laterality: Right;  . SENTINEL NODE BIOPSY Left 01/16/2015   Procedure: SENTINEL NODE BIOPSY;  Surgeon: [name], MD;  Location: [facility];  Service: General;  Laterality: Left;  . TOTAL HIP ARTHROPLASTY Right 09/04/2015   Procedure: RIGHT TOTAL HIP ARTHROPLASTY ANTERIOR APPROACH;  Surgeon: [name], MD;  Location: [facility];  Service: Orthopedics;  Laterality: Right;    FAMILY HISTORY :   Family History  Problem Relation Age of Onset  . Breast cancer Sister 50  . Addison's disease Mother   . Hyperthyroidism Mother   . Osteoarthritis Mother   . Stroke Father   . Heart disease Father   . High blood pressure Father     SOCIAL HISTORY:   Social History   Tobacco Use  . Smoking status: Former Smoker    Packs/day: 0.25    Years: 5.00    Pack years: 1.25    Types: Cigarettes    Quit date: 07/28/1972    Years since quitting: 48.2  . Smokeless tobacco: Never Used  Vaping Use  . Vaping Use: Never used  Substance Use Topics  . Alcohol use: Yes    Alcohol/week: 1.0 - 2.0 standard drink    Types: 1 - 2 Glasses of wine per week    Comment: 1 Glass Wine / Night  . Drug use: No    ALLERGIES:  is allergic to donepezil.  MEDICATIONS:  Current Outpatient Medications  Medication Sig Dispense Refill  . acetaminophen (TYLENOL) 325 MG  tablet Take 650 mg by mouth every 4 (four) hours as needed. Pain / increased temp.    . ARTIFICIAL TEARS 1 % ophthalmic solution Place 1 drop into both eyes daily.    Marland Kitchen ascorbic acid (VITAMIN C) 500 MG tablet Take 1 tablet by mouth daily.    . Calcium Carbonate-Vitamin D (CALTRATE 600+D PO) Take 1 tablet by mouth daily.    . celecoxib (CELEBREX) 200 MG capsule Take 200 mg by mouth daily.     . Cetirizine HCl (ZYRTEC ALLERGY PO) Take 1 tablet by mouth as needed (allergies).    Marland Kitchen levETIRAcetam (KEPPRA) 250 MG tablet 1 tablet daily at bedtime  5  . traMADol (ULTRAM) 50 MG tablet Take 1 tablet (50 mg total) by mouth 2 (two) times daily as needed for moderate pain. 60 tablet 0  .  Travoprost, BAK Free, (TRAVATAN) 0.004 % SOLN ophthalmic solution travoprost 0.004 % eye drops    . vitamin E 400 UNIT capsule Take 400 Units by mouth daily.      No current facility-administered medications for this visit.   Facility-Administered Medications Ordered in Other Visits  Medication Dose Route Frequency Provider Last Rate Last Admin  . sodium chloride flush (NS) 0.9 % injection 10 mL  10 mL Intravenous PRN Charlaine Dalton R, MD   10 mL at 07/07/18 0830    PHYSICAL EXAMINATION: ECOG PERFORMANCE STATUS: 2 - Symptomatic, <50% confined to bed  There were no vitals taken for this visit.  There were no vitals filed for this visit.   Physical Exam Constitutional:      Comments: Patient is accompanied by husband.  In a wheelchair.  HENT:     Head: Normocephalic and atraumatic.     Mouth/Throat:     Pharynx: No oropharyngeal exudate.  Eyes:     Pupils: Pupils are equal, round, and reactive to light.  Cardiovascular:     Rate and Rhythm: Normal rate and regular rhythm.  Pulmonary:     Effort: Pulmonary effort is normal. No respiratory distress.     Breath sounds: Normal breath sounds. No wheezing.  Abdominal:     General: Bowel sounds are normal. There is no distension.     Palpations: Abdomen is  soft. There is no mass.     Tenderness: There is no abdominal tenderness. There is no guarding or rebound.  Musculoskeletal:        General: No tenderness. Normal range of motion.     Cervical back: Normal range of motion and neck supple.  Skin:    General: Skin is warm.  Neurological:     Mental Status: She is alert and oriented to person, place, and time.  Psychiatric:        Mood and Affect: Affect normal.     LABORATORY DATA:  I have reviewed the data as listed    Component Value Date/Time   NA 138 10/02/2020 0844   K 3.5 10/02/2020 0844   CL 100 10/02/2020 0844   CO2 28 10/02/2020 0844   GLUCOSE 155 (H) 10/02/2020 0844   BUN 14 10/02/2020 0844   CREATININE 0.83 10/02/2020 0844   CALCIUM 8.6 (L) 10/02/2020 0844   PROT 6.9 10/02/2020 0844   ALBUMIN 3.9 10/02/2020 0844   AST 25 10/02/2020 0844   ALT 17 10/02/2020 0844   ALKPHOS 50 10/02/2020 0844   BILITOT 0.6 10/02/2020 0844   GFRNONAA >60 10/02/2020 0844   GFRAA >60 04/16/2020 0814    No results found for: SPEP, UPEP  Lab Results  Component Value Date   WBC 6.7 10/02/2020   NEUTROABS 5.1 10/02/2020   HGB 13.6 10/02/2020   HCT 42.7 10/02/2020   MCV 93.4 10/02/2020   PLT 270 10/02/2020      Chemistry      Component Value Date/Time   NA 138 10/02/2020 0844   K 3.5 10/02/2020 0844   CL 100 10/02/2020 0844   CO2 28 10/02/2020 0844   BUN 14 10/02/2020 0844   CREATININE 0.83 10/02/2020 0844      Component Value Date/Time   CALCIUM 8.6 (L) 10/02/2020 0844   ALKPHOS 50 10/02/2020 0844   AST 25 10/02/2020 0844   ALT 17 10/02/2020 0844   BILITOT 0.6 10/02/2020 0844       RADIOGRAPHIC STUDIES: I have personally reviewed the radiological images as listed  and agreed with the findings in the report. No results found.   ASSESSMENT & PLAN:  No problem-specific Assessment & Plan notes found for this encounter.   No orders of the defined types were placed in this encounter.  All questions were answered.  The patient knows to call the clinic with any problems, questions or concerns.      Cammie Sickle, MD 10/23/2020 8:22 AM

## 2020-10-23 NOTE — Assessment & Plan Note (Deleted)
#  Metastatic breast cancer ER [name] HER-2/neu positive. DEC [id number]- CT scan chest and pelvis NED- STABLE:  sclerosis of the manubrium.    # proceed with Herceptin-perjeta. Labs today reviewed;  acceptable for treatment today. Will scan in April 2022  # Drop in ejection fraction-June 14 MUGA scan ejection fraction 47%; AUG 2021- MUGA scan- 61%. NOV 2021- EF-55% [2 months break- DEC-FEB, 2022]. Scan in April 2022    # dry eyes/ keratitis sicca- *herceptin-perjeta; not sure if casutive factor.  Continue moisturizing eyedrops.  Stable.    # Chronic back and hip pain/ gait instability-stable.  On asper patch/ tramadol prn.    DISPOSITION:  #Herceptin-Perjeta today # Follow up-in 3 weeks  labs- labs-cbc/cmp;  Herceptin-Perjeta; Dr.B

## 2020-10-26 ENCOUNTER — Inpatient Hospital Stay: Payer: Medicare Other

## 2020-10-26 ENCOUNTER — Inpatient Hospital Stay (HOSPITAL_BASED_OUTPATIENT_CLINIC_OR_DEPARTMENT_OTHER): Payer: Medicare Other | Admitting: Internal Medicine

## 2020-10-26 ENCOUNTER — Encounter: Payer: Self-pay | Admitting: Internal Medicine

## 2020-10-26 ENCOUNTER — Inpatient Hospital Stay: Payer: Medicare Other | Attending: Internal Medicine

## 2020-10-26 VITALS — BP 140/61 | HR 66 | Temp 97.1°F

## 2020-10-26 DIAGNOSIS — Z5112 Encounter for antineoplastic immunotherapy: Secondary | ICD-10-CM | POA: Insufficient documentation

## 2020-10-26 DIAGNOSIS — R2689 Other abnormalities of gait and mobility: Secondary | ICD-10-CM | POA: Diagnosis not present

## 2020-10-26 DIAGNOSIS — F0390 Unspecified dementia without behavioral disturbance: Secondary | ICD-10-CM | POA: Insufficient documentation

## 2020-10-26 DIAGNOSIS — R6889 Other general symptoms and signs: Secondary | ICD-10-CM | POA: Diagnosis not present

## 2020-10-26 DIAGNOSIS — Z95828 Presence of other vascular implants and grafts: Secondary | ICD-10-CM

## 2020-10-26 DIAGNOSIS — C50812 Malignant neoplasm of overlapping sites of left female breast: Secondary | ICD-10-CM

## 2020-10-26 DIAGNOSIS — M25559 Pain in unspecified hip: Secondary | ICD-10-CM | POA: Diagnosis not present

## 2020-10-26 DIAGNOSIS — Z171 Estrogen receptor negative status [ER-]: Secondary | ICD-10-CM | POA: Insufficient documentation

## 2020-10-26 DIAGNOSIS — G8929 Other chronic pain: Secondary | ICD-10-CM | POA: Insufficient documentation

## 2020-10-26 DIAGNOSIS — R569 Unspecified convulsions: Secondary | ICD-10-CM | POA: Diagnosis not present

## 2020-10-26 DIAGNOSIS — R739 Hyperglycemia, unspecified: Secondary | ICD-10-CM | POA: Insufficient documentation

## 2020-10-26 DIAGNOSIS — H5789 Other specified disorders of eye and adnexa: Secondary | ICD-10-CM | POA: Insufficient documentation

## 2020-10-26 DIAGNOSIS — M3501 Sicca syndrome with keratoconjunctivitis: Secondary | ICD-10-CM | POA: Insufficient documentation

## 2020-10-26 LAB — CBC WITH DIFFERENTIAL/PLATELET
Abs Immature Granulocytes: 0.03 10*3/uL (ref 0.00–0.07)
Basophils Absolute: 0 10*3/uL (ref 0.0–0.1)
Basophils Relative: 1 %
Eosinophils Absolute: 0.2 10*3/uL (ref 0.0–0.5)
Eosinophils Relative: 3 %
HCT: 42.8 % (ref 36.0–46.0)
Hemoglobin: 13.7 g/dL (ref 12.0–15.0)
Immature Granulocytes: 1 %
Lymphocytes Relative: 20 %
Lymphs Abs: 1.2 10*3/uL (ref 0.7–4.0)
MCH: 30 pg (ref 26.0–34.0)
MCHC: 32 g/dL (ref 30.0–36.0)
MCV: 93.9 fL (ref 80.0–100.0)
Monocytes Absolute: 0.3 10*3/uL (ref 0.1–1.0)
Monocytes Relative: 5 %
Neutro Abs: 4.4 10*3/uL (ref 1.7–7.7)
Neutrophils Relative %: 70 %
Platelets: 267 10*3/uL (ref 150–400)
RBC: 4.56 MIL/uL (ref 3.87–5.11)
RDW: 13.5 % (ref 11.5–15.5)
WBC: 6.2 10*3/uL (ref 4.0–10.5)
nRBC: 0 % (ref 0.0–0.2)

## 2020-10-26 LAB — COMPREHENSIVE METABOLIC PANEL
ALT: 18 U/L (ref 0–44)
AST: 25 U/L (ref 15–41)
Albumin: 3.9 g/dL (ref 3.5–5.0)
Alkaline Phosphatase: 53 U/L (ref 38–126)
Anion gap: 9 (ref 5–15)
BUN: 19 mg/dL (ref 8–23)
CO2: 27 mmol/L (ref 22–32)
Calcium: 8.5 mg/dL — ABNORMAL LOW (ref 8.9–10.3)
Chloride: 102 mmol/L (ref 98–111)
Creatinine, Ser: 0.9 mg/dL (ref 0.44–1.00)
GFR, Estimated: >60 mL/min (ref >60)
Glucose, Bld: 182 mg/dL — ABNORMAL HIGH (ref 70–99)
Potassium: 3.6 mmol/L (ref 3.5–5.1)
Sodium: 138 mmol/L (ref 135–145)
Total Bilirubin: 0.5 mg/dL (ref 0.3–1.2)
Total Protein: 6.7 g/dL (ref 6.5–8.1)

## 2020-10-26 MED ORDER — DIPHENHYDRAMINE HCL 50 MG/ML IJ SOLN
12.5000 mg | Freq: Once | INTRAMUSCULAR | Status: AC
  Administered 2020-10-26: 12.5 mg via INTRAVENOUS
  Filled 2020-10-26: qty 1

## 2020-10-26 MED ORDER — SODIUM CHLORIDE 0.9 % IV SOLN
6.0000 mg/kg | Freq: Once | INTRAVENOUS | Status: DC
Start: 2020-10-26 — End: 2020-10-26

## 2020-10-26 MED ORDER — ACETAMINOPHEN 325 MG PO TABS
650.0000 mg | ORAL_TABLET | Freq: Once | ORAL | Status: AC
  Administered 2020-10-26: 650 mg via ORAL
  Filled 2020-10-26: qty 2

## 2020-10-26 MED ORDER — SODIUM CHLORIDE 0.9% FLUSH
10.0000 mL | Freq: Once | INTRAVENOUS | Status: AC
  Administered 2020-10-26: 10 mL via INTRAVENOUS
  Filled 2020-10-26: qty 10

## 2020-10-26 MED ORDER — HEPARIN SOD (PORK) LOCK FLUSH 100 UNIT/ML IV SOLN
500.0000 [IU] | Freq: Once | INTRAVENOUS | Status: DC | PRN
  Filled 2020-10-26: qty 5

## 2020-10-26 MED ORDER — HEPARIN SOD (PORK) LOCK FLUSH 100 UNIT/ML IV SOLN
INTRAVENOUS | Status: AC
  Filled 2020-10-26: qty 5

## 2020-10-26 MED ORDER — TRASTUZUMAB-ANNS CHEMO 150 MG IV SOLR
450.0000 mg | Freq: Once | INTRAVENOUS | Status: AC
  Administered 2020-10-26: 450 mg via INTRAVENOUS
  Filled 2020-10-26: qty 21.43

## 2020-10-26 MED ORDER — SODIUM CHLORIDE 0.9 % IV SOLN
420.0000 mg | Freq: Once | INTRAVENOUS | Status: AC
  Administered 2020-10-26: 420 mg via INTRAVENOUS
  Filled 2020-10-26: qty 14

## 2020-10-26 MED ORDER — HEPARIN SOD (PORK) LOCK FLUSH 100 UNIT/ML IV SOLN
500.0000 [IU] | Freq: Once | INTRAVENOUS | Status: AC
  Administered 2020-10-26: 500 [IU] via INTRAVENOUS
  Filled 2020-10-26: qty 5

## 2020-10-26 MED ORDER — SODIUM CHLORIDE 0.9 % IV SOLN
Freq: Once | INTRAVENOUS | Status: AC
  Filled 2020-10-26: qty 250

## 2020-10-26 NOTE — Assessment & Plan Note (Addendum)
#  Metastatic breast cancer ER [name] HER-2/neu positive. DEC [id number]- CT scan chest and pelvis NED- STABLE:  sclerosis of the manubrium.    # proceed with Herceptin-perjeta. Labs today reviewed;  acceptable for treatment today.  Ordered scan in April 2022  # Drop in ejection fraction-June 14 MUGA scan ejection fraction 47%; AUG 2021- MUGA scan- 61%. NOV 2021- EF-55% [2 months break- DEC-FEB, 2022].  Ordered MUGA April 2022    # dry eyes/ keratitis sicca- *herceptin-perjeta; not sure if casutive factor.  Continue moisturizing eyedrops.  STABLE..    # Chronic back and hip pain/ gait instability-stable.  On asper patch/ tramadol prn.    # Elevated BG-post breakfast in the range of 1 80-200 check HbA1c at next visit.   DISPOSITION:  #Herceptin-Perjeta today # Follow up-in 3 weeks  labs- labs-cbc/cmp;HbA1C  Herceptin-Perjeta;PRIOR- CT C/A/P; MUGA scan Dr.B

## 2020-10-26 NOTE — Progress Notes (Signed)
[place] OFFICE PROGRESS NOTE  Patient Care Team: [name], MD as PCP - General (Internal Medicine) Bary Castilla, [name], MD (General Surgery) Requested, Self Cammie Sickle, MD as Consulting Physician (Internal Medicine)  Cancer Staging No matching staging information was found for the patient.   Oncology History Overview Note  # June 2016- LEFT BREAST CA;  [name] ; [name] ; [name]; Her 2 Neu POS; [place] from July OF 2016; [name]; adjuvant Herceptin [ Finished July 2017]; AUG 2017- Neratinib x5 days; DISCON sec to diarrhea  # MID OCT 2018- Right breast mass-Bx- ER/PR-NEG; Her 2 NEU POSITIVE 1~2.5cm;  [?NEW primary]  # MID-OCT 2018-METASTATIC RECURRENT-oh sternal mass; Left Ax LN [Bx]/periportal LN  # OCT 25th 2018- TAXOL-HERCEPTIN-PERJETA; Jan 2019- CT PR; continue HP only; on HOLD since NOV 2019--sec to drop in EF/Sieziues  # July 15th 2020- RE-STARTED HP   -------------------------------------------------------------------  # MUGA scan- July 28th 2017- 67%.  December 2019-EF 53%; January 2020 EF-52%  # chronic gait/balance issues  # Seizures/petit-mal/ Dr.Shah-Keppra [name] [id number]]  # June 2017- left breast Bx- fat necrosis [[name]  # july 2017-  BRCA 1& 2- NEG.   MOLECULAR TESTING- F ONE- TPS- 0%;  ERB2 amplification; PI3K/RET amplification Others**  # PALLIATIVE CARE: P  --------------------------------------------------    DIAGNOSIS: [ OCT 1191]- REC/MET- BREAST CA ER/PR-NEG; her 2 POS  STAGE: 4  ;GOALS: Palliative  CURRENT/MOST RECENT THERAPY- Herceptin-Perjeta [C]    Carcinoma of overlapping sites of left breast in female, estrogen receptor negative ([place])     INTERVAL HISTORY: Patient a poor historian given dementia.  Patient is accompanied by her husband today.  [name] 80 y.o.  female pleasant patient above history of metastatic breast cancer on Herceptin  plus Perjeta-is here for follow-up.  No new shortness of breath or cough.  No swelling in the legs.  No unusual weight gain.  Continues have chronic back pain.  Chronic dry itchy eyes using moisturizing lotions.  Review of Systems  Unable to perform ROS: Dementia    PAST MEDICAL HISTORY :  Past Medical History:  Diagnosis Date  . Arthritis   . 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 Breast cancer ([place]) 2018   left breast; surgery 01/11/15 with Dr. Bary Castilla; path with invasive mammary and DCIS  . Breast cancer of upper-inner quadrant of left female breast ([place]) 12/15/14   Completed radiation end of December and finished chemotherapy 2 weeks ago, Left breast invasive mammary carcinoma, T1cN47mc (1.5 cm); Grade 3, [name], [name], [name], .  .Marland KitchenCataract    bilat   . DDD (degenerative disc disease), lumbar    Lumbar, previously evaluated by Dr. HEarnestine Leys . Dementia ([place]   . Fibrocystic breast disease    prior biopsy  . Glaucoma   . 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 Hearing loss   . History of cancer chemotherapy   . History of radiation therapy   . Hyperlipidemia, unspecified   . Imbalance   . Memory impairment    seen by Dr SManuella Ghazi possible post crainiotomy from radiation  . [place]eningioma ([place]    Left cavern[facility]  . Numbness and tingling    right hand   . Osteoarthritis   . Osteoporosis, post-menopausal   .  Personal history of chemotherapy   . Personal history of radiation therapy   . Shoulder pain, right   . Wears glasses     PAST SURGICAL HISTORY :   Past Surgical History:  Procedure Laterality Date  . APPENDECTOMY  1950  . BRAIN SURGERY  1995   left frontal/temporal  . BREAST BIOPSY Left 12/15/14   confirmed DCIS  . BREAST BIOPSY Left 01/08/2015   Procedure: BREAST BIOPSY WITH NEEDLE LOCALIZATION;  Surgeon: [name], MD;   Location: [facility];  Service: General;  Laterality: Left;  . BREAST EXCISIONAL BIOPSY Left 1997  . BREAST LUMPECTOMY Left 01/08/2015   Procedure: LUMPECTOMY;  Surgeon: [name], MD;  Location: [facility];  Service: General;  Laterality: Left;  . COLONOSCOPY  2010   Dr. [name]  . ORIF ANKLE FRACTURE Right 08/05/2017   Procedure: OPEN REDUCTION INTERNAL FIXATION (ORIF) ANKLE FRACTURE;  Surgeon: Earnestine Leys, MD;  Location: [facility];  Service: Orthopedics;  Laterality: Right;  . PORTACATH PLACEMENT Right 01/16/2015   Procedure: INSERTION PORT-A-CATH;  Surgeon: [name], MD;  Location: [facility];  Service: General;  Laterality: Right;  . SENTINEL NODE BIOPSY Left 01/16/2015   Procedure: SENTINEL NODE BIOPSY;  Surgeon: [name], MD;  Location: [facility];  Service: General;  Laterality: Left;  . TOTAL HIP ARTHROPLASTY Right 09/04/2015   Procedure: RIGHT TOTAL HIP ARTHROPLASTY ANTERIOR APPROACH;  Surgeon: [name], MD;  Location: [facility];  Service: Orthopedics;  Laterality: Right;    FAMILY HISTORY :   Family History  Problem Relation Age of Onset  . Breast cancer Sister 7  . Addison's disease Mother   . Hyperthyroidism Mother   . Osteoarthritis Mother   . Stroke Father   . Heart disease Father   . High blood pressure Father     SOCIAL HISTORY:   Social History   Tobacco Use  . Smoking status: Former Smoker    Packs/day: 0.25    Years: 5.00    Pack years: 1.25    Types: Cigarettes    Quit date: 07/28/1972    Years since quitting: 48.2  . Smokeless tobacco: Never Used  Vaping Use  . Vaping Use: Never used  Substance Use Topics  . Alcohol use: Yes    Alcohol/week: 1.0 - 2.0 standard drink    Types: 1 - 2 Glasses of wine per week    Comment: 1 Glass Wine / Night  . Drug use: No    ALLERGIES:  is allergic to donepezil.  MEDICATIONS:  Current Outpatient Medications  Medication Sig Dispense Refill  . acetaminophen (TYLENOL) 325 MG tablet Take 650 mg by  mouth every 4 (four) hours as needed. Pain / increased temp.    . ARTIFICIAL TEARS 1 % ophthalmic solution Place 1 drop into both eyes daily.    Marland Kitchen ascorbic acid (VITAMIN C) 500 MG tablet Take 1 tablet by mouth daily.    . Calcium Carbonate-Vitamin D (CALTRATE 600+D PO) Take 1 tablet by mouth daily.    . celecoxib (CELEBREX) 200 MG capsule Take 200 mg by mouth daily.     . Cetirizine HCl (ZYRTEC ALLERGY PO) Take 1 tablet by mouth as needed (allergies).    Marland Kitchen levETIRAcetam (KEPPRA) 250 MG tablet 1 tablet daily at bedtime  5  . traMADol (ULTRAM) 50 MG tablet Take 1 tablet (50 mg total) by mouth 2 (two) times daily as needed for moderate pain. 60 tablet 0  . Travoprost, BAK Free, (TRAVATAN)  0.004 % SOLN ophthalmic solution travoprost 0.004 % eye drops    . vitamin E 400 UNIT capsule Take 400 Units by mouth daily.      No current facility-administered medications for this visit.   Facility-Administered Medications Ordered in Other Visits  Medication Dose Route Frequency Provider Last Rate Last Admin  . heparin lock flush 100 unit/mL  500 Units Intracatheter Once PRN Charlaine Dalton R, MD      . sodium chloride flush (NS) 0.9 % injection 10 mL  10 mL Intravenous PRN Cammie Sickle, MD   10 mL at 07/07/18 0830    PHYSICAL EXAMINATION: ECOG PERFORMANCE STATUS: 2 - Symptomatic, <50% confined to bed  BP 114/68 (BP Location: Right Arm, Patient Position: Sitting, Cuff Size: Normal)   Pulse 75   Temp 97.8 F (36.6 C) (Oral)   Ht _0  (1.651 m)   Wt 173 lb (78.5 kg)   SpO2 97%   BMI 28.79 kg/m   Filed Weights   10/26/20 0849  Weight: 173 lb (78.5 kg)     Physical Exam Constitutional:      Comments: Patient is accompanied by husband.  In a wheelchair.  HENT:     Head: Normocephalic and atraumatic.     Mouth/Throat:     Pharynx: No oropharyngeal exudate.  Eyes:     Pupils: Pupils are equal, round, and reactive to light.  Cardiovascular:     Rate and Rhythm: Normal rate and  regular rhythm.  Pulmonary:     Effort: Pulmonary effort is normal. No respiratory distress.     Breath sounds: Normal breath sounds. No wheezing.  Abdominal:     General: Bowel sounds are normal. There is no distension.     Palpations: Abdomen is soft. There is no mass.     Tenderness: There is no abdominal tenderness. There is no guarding or rebound.  Musculoskeletal:        General: No tenderness. Normal range of motion.     Cervical back: Normal range of motion and neck supple.  Skin:    General: Skin is warm.  Neurological:     Mental Status: She is alert and oriented to person, place, and time.  Psychiatric:        Mood and Affect: Affect normal.     LABORATORY DATA:  I have reviewed the data as listed    Component Value Date/Time   NA 138 10/26/2020 0834   K 3.6 10/26/2020 0834   CL 102 10/26/2020 0834   CO2 27 10/26/2020 0834   GLUCOSE 182 (H) 10/26/2020 0834   BUN 19 10/26/2020 0834   CREATININE 0.90 10/26/2020 0834   CALCIUM 8.5 (L) 10/26/2020 0834   PROT 6.7 10/26/2020 0834   ALBUMIN 3.9 10/26/2020 0834   AST 25 10/26/2020 0834   ALT 18 10/26/2020 0834   ALKPHOS 53 10/26/2020 0834   BILITOT 0.5 10/26/2020 0834   GFRNONAA >60 10/26/2020 0834   GFRAA >60 04/16/2020 0814    No results found for: SPEP, UPEP  Lab Results  Component Value Date   WBC 6.2 10/26/2020   NEUTROABS 4.4 10/26/2020   HGB 13.7 10/26/2020   HCT 42.8 10/26/2020   MCV 93.9 10/26/2020   PLT 267 10/26/2020      Chemistry      Component Value Date/Time   NA 138 10/26/2020 0834   K 3.6 10/26/2020 0834   CL 102 10/26/2020 0834   CO2 27 10/26/2020 0834   BUN 19 10/26/2020  1610   CREATININE 0.90 10/26/2020 0834      Component Value Date/Time   CALCIUM 8.5 (L) 10/26/2020 0834   ALKPHOS 53 10/26/2020 0834   AST 25 10/26/2020 0834   ALT 18 10/26/2020 0834   BILITOT 0.5 10/26/2020 0834       RADIOGRAPHIC STUDIES: I have personally reviewed the radiological images as listed and  agreed with the findings in the report. No results found.   ASSESSMENT & PLAN:  Carcinoma of overlapping sites of left breast in female, estrogen receptor negative ([place]) #Metastatic breast cancer ER [name] HER-2/neu positive. DEC [id number]- CT scan chest and pelvis NED- STABLE:  sclerosis of the manubrium.    # proceed with Herceptin-perjeta. Labs today reviewed;  acceptable for treatment today.  Ordered scan in April 2022  # Drop in ejection fraction-June 14 MUGA scan ejection fraction 47%; AUG 2021- MUGA scan- 61%. NOV 2021- EF-55% [2 months break- DEC-FEB, 2022].  Ordered MUGA April 2022    # dry eyes/ keratitis sicca- *herceptin-perjeta; not sure if casutive factor.  Continue moisturizing eyedrops.  STABLE..    # Chronic back and hip pain/ gait instability-stable.  On asper patch/ tramadol prn.    # Elevated BG-post breakfast in the range of 1 80-200 check HbA1c at next visit.   DISPOSITION:  #Herceptin-Perjeta today # Follow up-in 3 weeks  labs- labs-cbc/cmp;HbA1C  Herceptin-Perjeta;PRIOR- CT C/A/P; MUGA scan Dr.B     Orders Placed This Encounter  Procedures  . CT CHEST ABDOMEN PELVIS W CONTRAST    Standing Status:   Future    Standing Expiration Date:   10/26/2021    Order Specific Question:   Preferred imaging location?    Answer:   [facility]    Order Specific Question:   Radiology Contrast Protocol - do NOT remove file path    Answer:   [email or web address]  . NM Cardiac [place] [facility]    Standing Status:   Future    Standing Expiration Date:   10/26/2021    Order Specific Question:   Will [facility] be the location of this test?    Answer:   Yes    Order Specific Question:   Please indicate who you request to read the nuc med / echo results.    Answer:   [facility] Unassigned    Order Specific Question:   caspofungin (CANCIDAS) frequency    Answer:   Q 24H    Order Specific Question:   Preferred imaging location?    Answer:    [facility]    Order Specific Question:   Radiology Contrast Protocol - do NOT remove file path    Answer:   [email or web address]   All questions were answered. The patient knows to call the clinic with any problems, questions or concerns.      Cammie Sickle, MD 10/26/2020 12:34 PM

## 2020-11-15 ENCOUNTER — Other Ambulatory Visit: Payer: Self-pay

## 2020-11-15 DIAGNOSIS — Z171 Estrogen receptor negative status [ER-]: Secondary | ICD-10-CM

## 2020-11-15 DIAGNOSIS — R7309 Other abnormal glucose: Secondary | ICD-10-CM

## 2020-11-15 DIAGNOSIS — C50812 Malignant neoplasm of overlapping sites of left female breast: Secondary | ICD-10-CM

## 2020-11-16 ENCOUNTER — Inpatient Hospital Stay: Payer: Medicare Other

## 2020-11-16 ENCOUNTER — Inpatient Hospital Stay (HOSPITAL_BASED_OUTPATIENT_CLINIC_OR_DEPARTMENT_OTHER): Payer: Medicare Other | Admitting: Internal Medicine

## 2020-11-16 DIAGNOSIS — Z171 Estrogen receptor negative status [ER-]: Secondary | ICD-10-CM | POA: Diagnosis not present

## 2020-11-16 DIAGNOSIS — C50812 Malignant neoplasm of overlapping sites of left female breast: Secondary | ICD-10-CM

## 2020-11-16 DIAGNOSIS — Z5112 Encounter for antineoplastic immunotherapy: Secondary | ICD-10-CM | POA: Diagnosis not present

## 2020-11-16 DIAGNOSIS — R7309 Other abnormal glucose: Secondary | ICD-10-CM

## 2020-11-16 LAB — COMPREHENSIVE METABOLIC PANEL
ALT: 18 U/L (ref 0–44)
AST: 25 U/L (ref 15–41)
Albumin: 4 g/dL (ref 3.5–5.0)
Alkaline Phosphatase: 66 U/L (ref 38–126)
Anion gap: 11 (ref 5–15)
BUN: 18 mg/dL (ref 8–23)
CO2: 26 mmol/L (ref 22–32)
Calcium: 8.5 mg/dL — ABNORMAL LOW (ref 8.9–10.3)
Chloride: 102 mmol/L (ref 98–111)
Creatinine, Ser: 0.77 mg/dL (ref 0.44–1.00)
GFR, Estimated: >60 mL/min (ref >60)
Glucose, Bld: 163 mg/dL — ABNORMAL HIGH (ref 70–99)
Potassium: 3.7 mmol/L (ref 3.5–5.1)
Sodium: 139 mmol/L (ref 135–145)
Total Bilirubin: 0.5 mg/dL (ref 0.3–1.2)
Total Protein: 6.7 g/dL (ref 6.5–8.1)

## 2020-11-16 LAB — CBC WITH DIFFERENTIAL/PLATELET
Abs Immature Granulocytes: 0.02 10*3/uL (ref 0.00–0.07)
Basophils Absolute: 0 10*3/uL (ref 0.0–0.1)
Basophils Relative: 1 %
Eosinophils Absolute: 0.2 10*3/uL (ref 0.0–0.5)
Eosinophils Relative: 3 %
HCT: 42.1 % (ref 36.0–46.0)
Hemoglobin: 13.5 g/dL (ref 12.0–15.0)
Immature Granulocytes: 0 %
Lymphocytes Relative: 15 %
Lymphs Abs: 0.9 10*3/uL (ref 0.7–4.0)
MCH: 30.1 pg (ref 26.0–34.0)
MCHC: 32.1 g/dL (ref 30.0–36.0)
MCV: 94 fL (ref 80.0–100.0)
Monocytes Absolute: 0.4 10*3/uL (ref 0.1–1.0)
Monocytes Relative: 6 %
Neutro Abs: 4.6 10*3/uL (ref 1.7–7.7)
Neutrophils Relative %: 75 %
Platelets: 287 10*3/uL (ref 150–400)
RBC: 4.48 MIL/uL (ref 3.87–5.11)
RDW: 13.5 % (ref 11.5–15.5)
WBC: 6.1 10*3/uL (ref 4.0–10.5)
nRBC: 0 % (ref 0.0–0.2)

## 2020-11-16 LAB — HEMOGLOBIN A1C
Hgb A1c MFr Bld: 5.6 % (ref 4.8–5.6)
Mean Plasma Glucose: 114.02 mg/dL

## 2020-11-16 MED ORDER — ACETAMINOPHEN 325 MG PO TABS
650.0000 mg | ORAL_TABLET | Freq: Once | ORAL | Status: AC
Start: 2020-11-16 — End: 2020-11-16
  Administered 2020-11-16: 650 mg via ORAL
  Filled 2020-11-16: qty 2

## 2020-11-16 MED ORDER — SODIUM CHLORIDE 0.9 % IV SOLN
420.0000 mg | Freq: Once | INTRAVENOUS | Status: AC
  Administered 2020-11-16: 420 mg via INTRAVENOUS
  Filled 2020-11-16: qty 14

## 2020-11-16 MED ORDER — SODIUM CHLORIDE 0.9 % IV SOLN
Freq: Once | INTRAVENOUS | Status: AC
  Filled 2020-11-16: qty 250

## 2020-11-16 MED ORDER — HEPARIN SOD (PORK) LOCK FLUSH 100 UNIT/ML IV SOLN
500.0000 [IU] | Freq: Once | INTRAVENOUS | Status: AC | PRN
  Administered 2020-11-16: 500 [IU]
  Filled 2020-11-16: qty 5

## 2020-11-16 MED ORDER — TRASTUZUMAB-ANNS CHEMO 150 MG IV SOLR
450.0000 mg | Freq: Once | INTRAVENOUS | Status: AC
  Administered 2020-11-16: 450 mg via INTRAVENOUS
  Filled 2020-11-16: qty 21.43

## 2020-11-16 MED ORDER — HEPARIN SOD (PORK) LOCK FLUSH 100 UNIT/ML IV SOLN
INTRAVENOUS | Status: AC
  Filled 2020-11-16: qty 5

## 2020-11-16 MED ORDER — DIPHENHYDRAMINE HCL 50 MG/ML IJ SOLN
12.5000 mg | Freq: Once | INTRAMUSCULAR | Status: AC
  Administered 2020-11-16: 12.5 mg via INTRAVENOUS
  Filled 2020-11-16: qty 1

## 2020-11-16 NOTE — Assessment & Plan Note (Addendum)
#  Metastatic breast cancer ER [name] HER-2/neu positive. DEC [id number]- CT scan chest and pelvis NED-  STABLE;   sclerosis of the manubrium.    # proceed with Herceptin-perjeta. Labs today reviewed;  acceptable for treatment today.  Ordered scan in April 2022  # Drop in ejection fraction-January 09, 2020 MUGA scan ejection fraction 47%; AUG 2021- MUGA scan- 61%. NOV 2021- EF-55% [2 months break- DEC-FEB, 2022].  Ordered MUGA April 2022    # dry eyes/ keratitis sicca- *herceptin-perjeta; not sure if casutive factor.  Continue moisturizing eyedrops.  STABLE..    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    # Elevated BG-post breakfast in the range of 1 80-200; STABLE check HbA1c- pending today.  DISPOSITION:  #Herceptin-Perjeta today # Follow up-in 3 weeks - X-MD- labs-cbc/cmp;HbA1C  Herceptin-Perjeta;PRIOR- CT C/A/P; MUGA scan Dr.B

## 2020-11-16 NOTE — Progress Notes (Signed)
[place] OFFICE PROGRESS NOTE  Patient Care Team: [name], MD as PCP - General (Internal Medicine) Bary Castilla, [name], MD (General Surgery) Requested, Self Cammie Sickle, MD as Consulting Physician (Internal Medicine)  Cancer Staging No matching staging information was found for the patient.   Oncology History Overview Note  # June 2016- LEFT BREAST CA;  [name] ; [name] ; [name]; Her 2 Neu POS; [place] from July OF 2016; [name]; adjuvant Herceptin [ Finished July 2017]; AUG 2017- Neratinib x5 days; DISCON sec to diarrhea  # MID OCT 2018- Right breast mass-Bx- ER/PR-NEG; Her 2 NEU POSITIVE 1~2.5cm;  [?NEW primary]  # MID-OCT 2018-METASTATIC RECURRENT-oh sternal mass; Left Ax LN [Bx]/periportal LN  # OCT 25th 2018- TAXOL-HERCEPTIN-PERJETA; Jan 2019- CT PR; continue HP only; on HOLD since NOV 2019--sec to drop in EF/Sieziues  # July 15th 2020- RE-STARTED HP   -------------------------------------------------------------------  # MUGA scan- July 28th 2017- 67%.  December 2019-EF 53%; January 2020 EF-52%  # chronic gait/balance issues  # Seizures/petit-mal/ Dr.Shah-Keppra [name] [id number]]  # June 2017- left breast Bx- fat necrosis [[name]  # july 2017-  BRCA 1& 2- NEG.   MOLECULAR TESTING- F ONE- TPS- 0%;  ERB2 amplification; PI3K/RET amplification Others**  # PALLIATIVE CARE: P  --------------------------------------------------    DIAGNOSIS: [ OCT 9485]- REC/MET- BREAST CA ER/PR-NEG; her 2 POS  STAGE: 4  ;GOALS: Palliative  CURRENT/MOST RECENT THERAPY- Herceptin-Perjeta [C]    Carcinoma of overlapping sites of left breast in female, estrogen receptor negative ([place])     INTERVAL HISTORY: Patient a poor historian given dementia.  Patient is accompanied by her husband today.  [name] 80 y.o.  female pleasant patient above history of metastatic breast cancer on Herceptin  plus Perjeta-is here for follow-up.  Patient denies any new swelling in her legs.  Denies any unusual weight gain or shortness of breath or cough.  Chronic back pain.   Chronic dry itchy eyes using moisturizing lotion.  Review of Systems  Unable to perform ROS: Dementia    PAST MEDICAL HISTORY :  Past Medical History:  Diagnosis Date  . Arthritis   . 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 Breast cancer ([place]) 2018   left breast; surgery 01/11/15 with Dr. Bary Castilla; path with invasive mammary and DCIS  . Breast cancer of upper-inner quadrant of left female breast ([place]) 12/15/14   Completed radiation end of December and finished chemotherapy 2 weeks ago, Left breast invasive mammary carcinoma, T1cN63mc (1.5 cm); Grade 3, [name], [name], [name], .  .Marland KitchenCataract    bilat   . DDD (degenerative disc disease), lumbar    Lumbar, previously evaluated by Dr. [place]arnestine Leys . Dementia ([place]   . Fibrocystic breast disease    prior biopsy  . Glaucoma   . 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 Hearing loss   . History of cancer chemotherapy   . History of radiation therapy   . Hyperlipidemia, unspecified   . Imbalance   . Memory impairment    seen by Dr SManuella Ghazi possible post crainiotomy from radiation  . 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  . Numbness and tingling    right hand   . Osteoarthritis   . Osteoporosis, post-menopausal   .  Personal history of chemotherapy   . Personal history of radiation therapy   . Shoulder pain, right   . Wears glasses     PAST SURGICAL HISTORY :   Past Surgical History:  Procedure Laterality Date  . APPENDECTOMY  1950  . BRAIN SURGERY  1995   left frontal/temporal  . BREAST BIOPSY Left 12/15/14   confirmed DCIS  . BREAST BIOPSY Left 01/08/2015   Procedure: BREAST BIOPSY WITH NEEDLE LOCALIZATION;  Surgeon: [name], MD;   Location: [facility] ORS;  Service: General;  Laterality: Left;  . BREAST EXCISIONAL BIOPSY Left 1997  . BREAST LUMPECTOMY Left 01/08/2015   Procedure: LUMPECTOMY;  Surgeon: [name], MD;  Location: [facility] OR[facility];  Service: General;  Laterality: Left;  . COLONOSCOPY  2010   Dr. [name]  . ORIF ANKLE FRACTURE Right 08/05/2017   Procedure: OPEN REDUCTION INTERNAL FIXATION (ORIF) ANKLE FRACTURE;  Surgeon: Earnestine Leys, MD;  Location: [facility] ORS;  Service: Orthopedics;  Laterality: Right;  . PORTACATH PLACEMENT Right 01/16/2015   Procedure: INSERTION PORT-A-CATH;  Surgeon: [name], MD;  Location: [facility] ORS;  Service: General;  Laterality: Right;  . SENTINEL NODE BIOPSY Left 01/16/2015   Procedure: SENTINEL NODE BIOPSY;  Surgeon: [name], MD;  Location: [facility];  Service: General;  Laterality: Left;  . TOTAL HIP ARTHROPLASTY Right 09/04/2015   Procedure: RIGHT TOTAL HIP ARTHROPLASTY ANTERIOR APPROACH;  Surgeon: [name], MD;  Location: [facility];  Service: Orthopedics;  Laterality: Right;    FAMILY HISTORY :   Family History  Problem Relation Age of Onset  . Breast cancer Sister 79  . Addison's disease Mother   . Hyperthyroidism Mother   . Osteoarthritis Mother   . Stroke Father   . Heart disease Father   . High blood pressure Father     SOCIAL HISTORY:   Social History   Tobacco Use  . Smoking status: Former Smoker    Packs/day: 0.25    Years: 5.00    Pack years: 1.25    Types: Cigarettes    Quit date: 07/28/1972    Years since quitting: 48.3  . Smokeless tobacco: Never Used  Vaping Use  . Vaping Use: Never used  Substance Use Topics  . Alcohol use: Yes    Alcohol/week: 1.0 - 2.0 standard drink    Types: 1 - 2 Glasses of wine per week    Comment: 1 Glass Wine / Night  . Drug use: No    ALLERGIES:  is allergic to donepezil.  MEDICATIONS:  Current Outpatient Medications  Medication Sig Dispense Refill  . acetaminophen (TYLENOL) 325 MG tablet Take 650 mg by  mouth every 4 (four) hours as needed. Pain / increased temp.    . ARTIFICIAL TEARS 1 % ophthalmic solution Place 1 drop into both eyes daily.    Marland Kitchen ascorbic acid (VITAMIN C) 500 MG tablet Take 1 tablet by mouth daily.    . Calcium Carbonate-Vitamin D (CALTRATE 600+D PO) Take 1 tablet by mouth daily.    . celecoxib (CELEBREX) 200 MG capsule Take 200 mg by mouth daily.     Marland Kitchen levETIRAcetam (KEPPRA) 250 MG tablet 1 tablet daily at bedtime  5  . traMADol (ULTRAM) 50 MG tablet Take 1 tablet (50 mg total) by mouth 2 (two) times daily as needed for moderate pain. 60 tablet 0  . Travoprost, BAK Free, (TRAVATAN) 0.004 % SOLN ophthalmic solution travoprost 0.004 % eye drops    . vitamin E 400  UNIT capsule Take 400 Units by mouth daily.      No current facility-administered medications for this visit.   Facility-Administered Medications Ordered in Other Visits  Medication Dose Route Frequency Provider Last Rate Last Admin  . heparin lock flush 100 unit/mL  500 Units Intracatheter Once PRN Charlaine Dalton R, MD      . sodium chloride flush (NS) 0.9 % injection 10 mL  10 mL Intravenous PRN Cammie Sickle, MD   10 mL at 07/07/18 0830    PHYSICAL EXAMINATION: ECOG PERFORMANCE STATUS: 2 - Symptomatic, <50% confined to bed  Pulse 74   Temp (!) 96.8 F (36 C)   Resp 18   Wt 174 lb 6.4 oz (79.1 kg)   SpO2 98%   BMI 29.02 kg/m   Filed Weights   11/16/20 0849  Weight: 174 lb 6.4 oz (79.1 kg)     Physical Exam Constitutional:      Comments: Patient is accompanied by husband.  In a wheelchair.  HENT:     Head: Normocephalic and atraumatic.     Mouth/Throat:     Pharynx: No oropharyngeal exudate.  Eyes:     Pupils: Pupils are equal, round, and reactive to light.  Cardiovascular:     Rate and Rhythm: Normal rate and regular rhythm.  Pulmonary:     Effort: Pulmonary effort is normal. No respiratory distress.     Breath sounds: Normal breath sounds. No wheezing.  Abdominal:      General: Bowel sounds are normal. There is no distension.     Palpations: Abdomen is soft. There is no mass.     Tenderness: There is no abdominal tenderness. There is no guarding or rebound.  Musculoskeletal:        General: No tenderness. Normal range of motion.     Cervical back: Normal range of motion and neck supple.  Skin:    General: Skin is warm.  Neurological:     Mental Status: She is alert and oriented to person, place, and time.  Psychiatric:        Mood and Affect: Affect normal.     LABORATORY DATA:  I have reviewed the data as listed    Component Value Date/Time   NA 139 11/16/2020 0829   K 3.7 11/16/2020 0829   CL 102 11/16/2020 0829   CO2 26 11/16/2020 0829   GLUCOSE 163 (H) 11/16/2020 0829   BUN 18 11/16/2020 0829   CREATININE 0.77 11/16/2020 0829   CALCIUM 8.5 (L) 11/16/2020 0829   PROT 6.7 11/16/2020 0829   ALBUMIN 4.0 11/16/2020 0829   AST 25 11/16/2020 0829   ALT 18 11/16/2020 0829   ALKPHOS 66 11/16/2020 0829   BILITOT 0.5 11/16/2020 0829   GFRNONAA >60 11/16/2020 0829   GFRAA >60 04/16/2020 0814    No results found for: SPEP, UPEP  Lab Results  Component Value Date   WBC 6.1 11/16/2020   NEUTROABS 4.6 11/16/2020   HGB 13.5 11/16/2020   HCT 42.1 11/16/2020   MCV 94.0 11/16/2020   PLT 287 11/16/2020      Chemistry      Component Value Date/Time   NA 139 11/16/2020 0829   K 3.7 11/16/2020 0829   CL 102 11/16/2020 0829   CO2 26 11/16/2020 0829   BUN 18 11/16/2020 0829   CREATININE 0.77 11/16/2020 0829      Component Value Date/Time   CALCIUM 8.5 (L) 11/16/2020 0829   ALKPHOS 66 11/16/2020 0829  AST 25 11/16/2020 0829   ALT 18 11/16/2020 0829   BILITOT 0.5 11/16/2020 0829       RADIOGRAPHIC STUDIES: I have personally reviewed the radiological images as listed and agreed with the findings in the report. No results found.   ASSESSMENT & PLAN:  Carcinoma of overlapping sites of left breast in female, estrogen receptor negative  ([place]) #Metastatic breast cancer ER [name] HER-2/neu positive. DEC [id number]- CT scan chest and pelvis NED-  STABLE;   sclerosis of the manubrium.    # proceed with Herceptin-perjeta. Labs today reviewed;  acceptable for treatment today.  Ordered scan in April 2022  # Drop in ejection fraction-January 09, 2020 MUGA scan ejection fraction 47%; AUG 2021- MUGA scan- 61%. NOV 2021- EF-55% [2 months break- DEC-FEB, 2022].  Ordered MUGA April 2022    # dry eyes/ keratitis sicca- *herceptin-perjeta; not sure if casutive factor.  Continue moisturizing eyedrops.  STABLE..    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    # Elevated BG-post breakfast in the range of 1 80-200; STABLE check HbA1c- pending today.  DISPOSITION:  #Herceptin-Perjeta today # Follow up-in 3 weeks - X-MD- labs-cbc/cmp;HbA1C  Herceptin-Perjeta;PRIOR- CT C/A/P; MUGA scan Dr.B     No orders of the defined types were placed in this encounter.  All questions were answered. The patient knows to call the clinic with any problems, questions or concerns.      Cammie Sickle, MD 11/16/2020 9:20 AM

## 2020-11-16 NOTE — Progress Notes (Signed)
Per MD ok to treat today with last MUGA. She has has several treatment holidays.

## 2020-11-27 ENCOUNTER — Other Ambulatory Visit: Payer: Self-pay

## 2020-11-27 ENCOUNTER — Encounter
Admission: RE | Admit: 2020-11-27 | Discharge: 2020-11-27 | Disposition: A | Discharge status: Home / Self Care | Payer: Medicare Other | Source: Ambulatory Visit | Attending: Internal Medicine | Admitting: Internal Medicine

## 2020-11-27 DIAGNOSIS — T50905D Adverse effect of unspecified drugs, medicaments and biological substances, subsequent encounter: Secondary | ICD-10-CM | POA: Insufficient documentation

## 2020-11-27 DIAGNOSIS — I427 Cardiomyopathy due to drug and external agent: Secondary | ICD-10-CM | POA: Insufficient documentation

## 2020-11-27 DIAGNOSIS — Z171 Estrogen receptor negative status [ER-]: Secondary | ICD-10-CM | POA: Diagnosis present

## 2020-11-27 DIAGNOSIS — C50812 Malignant neoplasm of overlapping sites of left female breast: Secondary | ICD-10-CM | POA: Diagnosis present

## 2020-11-27 DIAGNOSIS — Z5181 Encounter for therapeutic drug level monitoring: Secondary | ICD-10-CM | POA: Diagnosis not present

## 2020-11-27 DIAGNOSIS — Z79899 Other long term (current) drug therapy: Secondary | ICD-10-CM | POA: Insufficient documentation

## 2020-11-27 MED ORDER — TECHNETIUM TC 99M-LABELED RED BLOOD CELLS IV KIT
20.0000 | PACK | Freq: Once | INTRAVENOUS | Status: AC | PRN
  Administered 2020-11-27: 20.99 via INTRAVENOUS

## 2020-11-29 ENCOUNTER — Ambulatory Visit
Admission: RE | Admit: 2020-11-29 | Discharge: 2020-11-29 | Disposition: A | Discharge status: Home / Self Care | Payer: Medicare Other | Source: Ambulatory Visit | Attending: Internal Medicine | Admitting: Internal Medicine

## 2020-11-29 ENCOUNTER — Other Ambulatory Visit: Payer: Self-pay

## 2020-11-29 DIAGNOSIS — C50812 Malignant neoplasm of overlapping sites of left female breast: Secondary | ICD-10-CM | POA: Diagnosis not present

## 2020-11-29 DIAGNOSIS — Z171 Estrogen receptor negative status [ER-]: Secondary | ICD-10-CM | POA: Diagnosis present

## 2020-11-29 MED ORDER — IOHEXOL 300 MG/ML  SOLN
100.0000 mL | Freq: Once | INTRAMUSCULAR | Status: AC | PRN
  Administered 2020-11-29: 100 mL via INTRAVENOUS

## 2020-12-07 ENCOUNTER — Ambulatory Visit: Payer: Medicare Other | Attending: Internal Medicine

## 2020-12-07 ENCOUNTER — Other Ambulatory Visit: Payer: Self-pay

## 2020-12-07 ENCOUNTER — Inpatient Hospital Stay: Payer: Medicare Other

## 2020-12-07 ENCOUNTER — Inpatient Hospital Stay (HOSPITAL_BASED_OUTPATIENT_CLINIC_OR_DEPARTMENT_OTHER): Payer: Medicare Other | Admitting: Oncology

## 2020-12-07 ENCOUNTER — Inpatient Hospital Stay: Payer: Medicare Other | Attending: Internal Medicine

## 2020-12-07 ENCOUNTER — Encounter: Payer: Self-pay | Admitting: Oncology

## 2020-12-07 VITALS — BP 122/60 | HR 78 | Temp 97.6°F | Resp 20 | Ht 65.0 in | Wt 174.8 lb

## 2020-12-07 DIAGNOSIS — Z452 Encounter for adjustment and management of vascular access device: Secondary | ICD-10-CM | POA: Diagnosis not present

## 2020-12-07 DIAGNOSIS — F0390 Unspecified dementia without behavioral disturbance: Secondary | ICD-10-CM | POA: Insufficient documentation

## 2020-12-07 DIAGNOSIS — I427 Cardiomyopathy due to drug and external agent: Secondary | ICD-10-CM

## 2020-12-07 DIAGNOSIS — Z171 Estrogen receptor negative status [ER-]: Secondary | ICD-10-CM | POA: Diagnosis not present

## 2020-12-07 DIAGNOSIS — Z23 Encounter for immunization: Secondary | ICD-10-CM

## 2020-12-07 DIAGNOSIS — G8929 Other chronic pain: Secondary | ICD-10-CM | POA: Insufficient documentation

## 2020-12-07 DIAGNOSIS — C50812 Malignant neoplasm of overlapping sites of left female breast: Secondary | ICD-10-CM | POA: Diagnosis not present

## 2020-12-07 DIAGNOSIS — Z95828 Presence of other vascular implants and grafts: Secondary | ICD-10-CM

## 2020-12-07 LAB — CBC WITH DIFFERENTIAL/PLATELET
Abs Immature Granulocytes: 0.02 10*3/uL (ref 0.00–0.07)
Basophils Absolute: 0 10*3/uL (ref 0.0–0.1)
Basophils Relative: 1 %
Eosinophils Absolute: 0.1 10*3/uL (ref 0.0–0.5)
Eosinophils Relative: 2 %
HCT: 43.3 % (ref 36.0–46.0)
Hemoglobin: 13.9 g/dL (ref 12.0–15.0)
Immature Granulocytes: 0 %
Lymphocytes Relative: 15 %
Lymphs Abs: 1 10*3/uL (ref 0.7–4.0)
MCH: 29.8 pg (ref 26.0–34.0)
MCHC: 32.1 g/dL (ref 30.0–36.0)
MCV: 92.9 fL (ref 80.0–100.0)
Monocytes Absolute: 0.4 10*3/uL (ref 0.1–1.0)
Monocytes Relative: 6 %
Neutro Abs: 4.8 10*3/uL (ref 1.7–7.7)
Neutrophils Relative %: 76 %
Platelets: 269 10*3/uL (ref 150–400)
RBC: 4.66 MIL/uL (ref 3.87–5.11)
RDW: 13.5 % (ref 11.5–15.5)
WBC: 6.3 10*3/uL (ref 4.0–10.5)
nRBC: 0 % (ref 0.0–0.2)

## 2020-12-07 LAB — COMPREHENSIVE METABOLIC PANEL
ALT: 20 U/L (ref 0–44)
AST: 28 U/L (ref 15–41)
Albumin: 4.1 g/dL (ref 3.5–5.0)
Alkaline Phosphatase: 62 U/L (ref 38–126)
Anion gap: 11 (ref 5–15)
BUN: 15 mg/dL (ref 8–23)
CO2: 26 mmol/L (ref 22–32)
Calcium: 8.5 mg/dL — ABNORMAL LOW (ref 8.9–10.3)
Chloride: 98 mmol/L (ref 98–111)
Creatinine, Ser: 0.9 mg/dL (ref 0.44–1.00)
GFR, Estimated: >60 mL/min (ref >60)
Glucose, Bld: 175 mg/dL — ABNORMAL HIGH (ref 70–99)
Potassium: 3.5 mmol/L (ref 3.5–5.1)
Sodium: 135 mmol/L (ref 135–145)
Total Bilirubin: 0.7 mg/dL (ref 0.3–1.2)
Total Protein: 7 g/dL (ref 6.5–8.1)

## 2020-12-07 MED ORDER — HEPARIN SOD (PORK) LOCK FLUSH 100 UNIT/ML IV SOLN
500.0000 [IU] | Freq: Once | INTRAVENOUS | Status: AC
  Administered 2020-12-07: 500 [IU] via INTRAVENOUS
  Filled 2020-12-07: qty 5

## 2020-12-07 MED ORDER — SODIUM CHLORIDE 0.9% FLUSH
10.0000 mL | Freq: Once | INTRAVENOUS | Status: AC
Start: 2020-12-07 — End: 2020-12-07
  Administered 2020-12-07: 10 mL via INTRAVENOUS
  Filled 2020-12-07: qty 10

## 2020-12-07 NOTE — Progress Notes (Signed)
   Covid-19 Vaccination Clinic  Name:  [name]    MRN: [id number] DOB: May 23, 1941  12/07/2020  [name] was observed post Covid-19 immunization for 15 minutes without incident. She was provided with Vaccine Information Sheet and instruction to access the V-Safe system.   [name] was instructed to call 911 with any severe reactions post vaccine: Marland Kitchen Difficulty breathing  . Swelling of face and throat  . A fast heartbeat  . A bad rash all over body  . Dizziness and weakness   Immunizations Administered    Name Date Dose VIS Date Route   PFIZER Comrnaty(Gray TOP) Covid-19 Vaccine 12/07/2020  2:55 PM 0.3 mL 07/05/2020 Intramuscular   Manufacturer: [place]   Lot: [id number]   NDC: [place], PharmD, MBA Clinical Acute Care Pharmacist

## 2020-12-07 NOTE — Progress Notes (Signed)
[place] OFFICE PROGRESS NOTE  Patient Care Team: [name], MD as PCP - General (Internal Medicine) Bary Castilla, [name], MD (General Surgery) Requested, Self Cammie Sickle, MD as Consulting Physician (Internal Medicine)  Cancer Staging No matching staging information was found for the patient.   Oncology History Overview Note  # June 2016- LEFT BREAST CA;  [name] ; [name] ; [name]; Her 2 Neu POS; [place] from July OF 2016; [name]; adjuvant Herceptin [ Finished July 2017]; AUG 2017- Neratinib x5 days; DISCON sec to diarrhea  # MID OCT 2018- Right breast mass-Bx- ER/PR-NEG; Her 2 NEU POSITIVE 1~2.5cm;  [?NEW primary]  # MID-OCT 2018-METASTATIC RECURRENT-oh sternal mass; Left Ax LN [Bx]/periportal LN  # OCT 25th 2018- TAXOL-HERCEPTIN-PERJETA; Jan 2019- CT PR; continue HP only; on HOLD since NOV 2019--sec to drop in EF/Sieziues  # July 15th 2020- RE-STARTED HP   -------------------------------------------------------------------  # MUGA scan- July 28th 2017- 67%.  December 2019-EF 53%; January 2020 EF-52%  # chronic gait/balance issues  # Seizures/petit-mal/ Dr.Shah-Keppra [name] [id number]]  # June 2017- left breast Bx- fat necrosis [[name]  # july 2017-  BRCA 1& 2- NEG.   MOLECULAR TESTING- F ONE- TPS- 0%;  ERB2 amplification; PI3K/RET amplification Others**  # PALLIATIVE CARE: P  --------------------------------------------------    DIAGNOSIS: [ OCT 4540]- REC/MET- BREAST CA ER/PR-NEG; her 2 POS  STAGE: 4  ;GOALS: Palliative  CURRENT/MOST RECENT THERAPY- Herceptin-Perjeta [C]    Carcinoma of overlapping sites of left breast in female, estrogen receptor negative ([place])   INTERVAL HISTORY: Patient a poor historian given dementia.  Patient is accompanied by her husband today.  [name] 80 y.o.  female pleasant patient above history of metastatic breast cancer on Herceptin  plus Perjeta-is here for follow-up.  She was last seen in clinic on 11/16/2020 with a proceeded with Perjeta and Herceptin.  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  Mrs. Marker's husband tells me she has been doing well.  Nothing new at this time.  Noted possible improvement of back pain.  Still remains very unstable on her feet.  Review of Systems  Unable to perform ROS: Dementia    PAST MEDICAL HISTORY :  Past Medical History:  Diagnosis Date  . Arthritis   . 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 Breast cancer ([place]) 2018   left breast; surgery 01/11/15 with Dr. Bary Castilla; path with invasive mammary and DCIS  . Breast cancer of upper-inner quadrant of left female breast ([place]) 12/15/14   Completed radiation end of December and finished chemotherapy 2 weeks ago, Left breast invasive mammary carcinoma, T1cN86mc (1.5 cm); Grade 3, [name], [name], [name], .  .Marland KitchenCataract    bilat   . DDD (degenerative disc disease), lumbar    Lumbar, previously evaluated by Dr. HEarnestine Leys . Dementia ([place]   . Fibrocystic breast disease    prior biopsy  . Glaucoma   . 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 Hearing loss   . History of cancer chemotherapy   . History of radiation therapy   . Hyperlipidemia, unspecified   . Imbalance   . Memory impairment    seen by Dr SManuella Ghazi possible post crainiotomy from radiation  . Meningioma ([facility])    Left cavernous sinus  meningioma, treated with resection and radiation therapy at [facility], 1995.  . Numbness and tingling    right hand   . Osteoarthritis   . Osteoporosis, post-menopausal   . Personal history of chemotherapy   . Personal history of radiation therapy   . Shoulder pain, right   . Wears glasses     PAST SURGICAL HISTORY :   Past Surgical History:  Procedure Laterality Date  . APPENDECTOMY  1950  . BRAIN SURGERY  1995   left  frontal/temporal  . BREAST BIOPSY Left 12/15/14   confirmed DCIS  . BREAST BIOPSY Left 01/08/2015   Procedure: BREAST BIOPSY WITH NEEDLE LOCALIZATION;  Surgeon: [name], MD;  Location: [facility];  Service: General;  Laterality: Left;  . BREAST EXCISIONAL BIOPSY Left 1997  . BREAST LUMPECTOMY Left 01/08/2015   Procedure: LUMPECTOMY;  Surgeon: [name], MD;  Location: [facility];  Service: General;  Laterality: Left;  . COLONOSCOPY  2010   Dr. [name]  . ORIF ANKLE FRACTURE Right 08/05/2017   Procedure: OPEN REDUCTION INTERNAL FIXATION (ORIF) ANKLE FRACTURE;  Surgeon: Earnestine Leys, MD;  Location: [facility];  Service: Orthopedics;  Laterality: Right;  . PORTACATH PLACEMENT Right 01/16/2015   Procedure: INSERTION PORT-A-CATH;  Surgeon: [name], MD;  Location: [facility];  Service: General;  Laterality: Right;  . SENTINEL NODE BIOPSY Left 01/16/2015   Procedure: SENTINEL NODE BIOPSY;  Surgeon: [name], MD;  Location: [facility];  Service: General;  Laterality: Left;  . TOTAL HIP ARTHROPLASTY Right 09/04/2015   Procedure: RIGHT TOTAL HIP ARTHROPLASTY ANTERIOR APPROACH;  Surgeon: [name], MD;  Location: [facility];  Service: Orthopedics;  Laterality: Right;    FAMILY HISTORY :   Family History  Problem Relation Age of Onset  . Breast cancer Sister 70  . Addison's disease Mother   . Hyperthyroidism Mother   . Osteoarthritis Mother   . Stroke Father   . Heart disease Father   . High blood pressure Father     SOCIAL HISTORY:   Social History   Tobacco Use  . Smoking status: Former Smoker    Packs/day: 0.25    Years: 5.00    Pack years: 1.25    Types: Cigarettes    Quit date: 07/28/1972    Years since quitting: 48.3  . Smokeless tobacco: Never Used  Vaping Use  . Vaping Use: Never used  Substance Use Topics  . Alcohol use: Yes    Alcohol/week: 1.0 - 2.0 standard drink    Types: 1 - 2 Glasses of wine per week    Comment: 1 Glass Wine / Night  . Drug use:  No    ALLERGIES:  is allergic to donepezil.  MEDICATIONS:  Current Outpatient Medications  Medication Sig Dispense Refill  . acetaminophen (TYLENOL) 325 MG tablet Take 650 mg by mouth every 4 (four) hours as needed. Pain / increased temp.    . ARTIFICIAL TEARS 1 % ophthalmic solution Place 1 drop into both eyes daily.    Marland Kitchen ascorbic acid (VITAMIN C) 500 MG tablet Take 1 tablet by mouth daily.    . Calcium Carbonate-Vitamin D (CALTRATE 600+D PO) Take 1 tablet by mouth daily.    . celecoxib (CELEBREX) 200 MG capsule Take 200 mg by mouth daily.     Marland Kitchen levETIRAcetam (KEPPRA) 250 MG tablet 1 tablet daily at bedtime  5  . traMADol (ULTRAM) 50 MG tablet Take 1 tablet (50 mg total) by mouth 2 (two) times  daily as needed for moderate pain. 60 tablet 0  . Travoprost, BAK Free, (TRAVATAN) 0.004 % SOLN ophthalmic solution travoprost 0.004 % eye drops    . vitamin E 400 UNIT capsule Take 400 Units by mouth daily.      No current facility-administered medications for this visit.   Facility-Administered Medications Ordered in Other Visits  Medication Dose Route Frequency Provider Last Rate Last Admin  . heparin lock flush 100 unit/mL  500 Units Intracatheter Once PRN Charlaine Dalton R, MD      . sodium chloride flush (NS) 0.9 % injection 10 mL  10 mL Intravenous PRN Cammie Sickle, MD   10 mL at 07/07/18 0830    PHYSICAL EXAMINATION: ECOG PERFORMANCE STATUS: 2 - Symptomatic, <50% confined to bed  There were no vitals taken for this visit.  There were no vitals filed for this visit.   Physical Exam Constitutional:      Comments: Patient is accompanied by husband.  In a wheelchair.  HENT:     Head: Normocephalic and atraumatic.     Mouth/Throat:     Pharynx: No oropharyngeal exudate.  Eyes:     Pupils: Pupils are equal, round, and reactive to light.  Cardiovascular:     Rate and Rhythm: Normal rate and regular rhythm.  Pulmonary:     Effort: Pulmonary effort is normal. No  respiratory distress.     Breath sounds: Normal breath sounds. No wheezing.  Abdominal:     General: Bowel sounds are normal. There is no distension.     Palpations: Abdomen is soft. There is no mass.     Tenderness: There is no abdominal tenderness. There is no guarding or rebound.  Musculoskeletal:        General: No tenderness. Normal range of motion.     Cervical back: Normal range of motion and neck supple.  Skin:    General: Skin is warm.  Neurological:     Mental Status: She is alert and oriented to person, place, and time.  Psychiatric:        Mood and Affect: Affect normal.     LABORATORY DATA:  I have reviewed the data as listed    Component Value Date/Time   NA 139 11/16/2020 0829   K 3.7 11/16/2020 0829   CL 102 11/16/2020 0829   CO2 26 11/16/2020 0829   GLUCOSE 163 (H) 11/16/2020 0829   BUN 18 11/16/2020 0829   CREATININE 0.77 11/16/2020 0829   CALCIUM 8.5 (L) 11/16/2020 0829   PROT 6.7 11/16/2020 0829   ALBUMIN 4.0 11/16/2020 0829   AST 25 11/16/2020 0829   ALT 18 11/16/2020 0829   ALKPHOS 66 11/16/2020 0829   BILITOT 0.5 11/16/2020 0829   GFRNONAA >60 11/16/2020 0829   GFRAA >60 04/16/2020 0814    No results found for: SPEP, UPEP  Lab Results  Component Value Date   WBC 6.1 11/16/2020   NEUTROABS 4.6 11/16/2020   HGB 13.5 11/16/2020   HCT 42.1 11/16/2020   MCV 94.0 11/16/2020   PLT 287 11/16/2020      Chemistry      Component Value Date/Time   NA 139 11/16/2020 0829   K 3.7 11/16/2020 0829   CL 102 11/16/2020 0829   CO2 26 11/16/2020 0829   BUN 18 11/16/2020 0829   CREATININE 0.77 11/16/2020 0829      Component Value Date/Time   CALCIUM 8.5 (L) 11/16/2020 0829   ALKPHOS 66 11/16/2020 0829   AST  25 11/16/2020 0829   ALT 18 11/16/2020 0829   BILITOT 0.5 11/16/2020 0829       RADIOGRAPHIC STUDIES: I have personally reviewed the radiological images as listed and agreed with the findings in the report. No results found.    ASSESSMENT & PLAN:  Metastatic breast cancer ER/PR positive HER2/neu positive: -Initially diagnosed in 2016. -She has been on Herceptin/Perjeta since January 2019. -Received Taxol Perjeta and Herceptin from 05/21/2017-07/22/17 and carbo/Herceptin plus Taxotere in 2016 followed by maintenance Herceptin for 1 year. -Has intermittent interruptions in treatment secondary to worsening of her EF. -Overall she is doing well. -Repeat CT chest/abdomen and pelvis from 11/29/2020 shows NED.  -Repeat MUGA from 11/27/2020 shows slight decline in EF from 55% to 52.9%.   -Given CT scan looks very stable, recommend holding treatment today for 1-2 cycles and repeat MUGA scan prior to restarting. -Patient and husband are in agreement.  Chronic back pain: -Causing gait instability. -Possibly improving some? -Continue tramadol and asper patch  DISPOSITION:  Hold treatment today secondary to decrease in EF. Repeat MUGA in 5 to 6 weeks. RTC in 6 weeks for repeat lab work (CBC, CMP, hemoglobin A1c), MD assessment and to restart Herceptin and Perjeta.  Greater than 50% was spent in counseling and coordination of care with this patient including but not limited to discussion of the relevant topics above (See A&P) including, but not limited to diagnosis and management of acute and chronic medical conditions. \  No problem-specific Assessment & Plan notes found for this encounter.   No orders of the defined types were placed in this encounter.  All questions were answered. The patient knows to call the clinic with any problems, questions or concerns.      Jacquelin Hawking, NP 12/07/2020 9:21 AM

## 2020-12-11 ENCOUNTER — Other Ambulatory Visit: Payer: Self-pay

## 2020-12-11 MED ORDER — PFIZER-BIONT COVID-19 VAC-TRIS 30 MCG/0.3ML IM SUSP
INTRAMUSCULAR | 0 refills | Status: DC
  Filled 2020-12-11: qty 0.3, 1d supply, fill #0

## 2020-12-14 ENCOUNTER — Other Ambulatory Visit: Payer: Self-pay | Admitting: General Surgery

## 2020-12-14 DIAGNOSIS — Z853 Personal history of malignant neoplasm of breast: Secondary | ICD-10-CM

## 2021-01-07 ENCOUNTER — Telehealth: Payer: Self-pay | Admitting: *Deleted

## 2021-01-07 NOTE — Telephone Encounter (Signed)
Dr Tobe Sos called statign that he tested positive for COVID 3 days ago. He states that he is trying to stay away from her as much as possible, but the fact that it is only the two of them in the home makes it near impossible not be be around her. She is scheduled for a MUGA scan on the 16th and lab/doctor/infusion on the 20th Do these appts need to be rescheduled? Please advise He did not say if she has been tested

## 2021-01-08 ENCOUNTER — Encounter: Payer: Self-pay | Admitting: Internal Medicine

## 2021-01-08 NOTE — Telephone Encounter (Signed)
Call returned to Dr Tobe Sos and I had to leave message on voice mail to have patient tested for COVID and that her appts will be postponed by a week or more and to expect a call from scheduler regarding this.  He called back and states that she has been tested twice already and has been negative both times. He is in agreement with postponing her Muga and doctor appointments and will await a call from [facility]

## 2021-01-08 NOTE — Telephone Encounter (Signed)
[name] - please have her husband test the patient for covid. If negative or if symptoms arise, then she may need to be tested again in the next few days. We will most likely need to post pone her muga and next treatment by a week or so to be on the safe side. Eldridge Abrahams will you coordinate the r/s of the apts.

## 2021-01-09 ENCOUNTER — Telehealth: Payer: Self-pay | Admitting: Internal Medicine

## 2021-01-09 ENCOUNTER — Encounter: Payer: Self-pay | Admitting: *Deleted

## 2021-01-09 NOTE — Telephone Encounter (Signed)
6/15-spoke to patient's husband regarding with concern for fall.  However no neurologic deficits noted.  I discussed my concerns for her -2 positive disease predisposition to brain metastasis.  Husband positive for COVID-mild symptoms.  However he is patient's primary caregiver at this time.  I think is reasonable to push out the appointments.  Agreement with the plan.  Discussed with husband/will call us if any concerns.

## 2021-01-14 ENCOUNTER — Inpatient Hospital Stay: Payer: Medicare Other

## 2021-01-14 ENCOUNTER — Inpatient Hospital Stay: Payer: Medicare Other | Admitting: Internal Medicine

## 2021-01-15 ENCOUNTER — Encounter: Payer: Self-pay | Admitting: Internal Medicine

## 2021-01-21 ENCOUNTER — Encounter: Payer: Self-pay | Admitting: Internal Medicine

## 2021-01-22 ENCOUNTER — Other Ambulatory Visit: Payer: Self-pay

## 2021-01-22 ENCOUNTER — Ambulatory Visit
Admission: RE | Admit: 2021-01-22 | Discharge: 2021-01-22 | Disposition: A | Discharge status: Home / Self Care | Payer: Medicare Other | Source: Ambulatory Visit | Attending: Oncology | Admitting: Oncology

## 2021-01-22 DIAGNOSIS — I427 Cardiomyopathy due to drug and external agent: Secondary | ICD-10-CM | POA: Diagnosis not present

## 2021-01-22 MED ORDER — TECHNETIUM TC 99M-LABELED RED BLOOD CELLS IV KIT
20.0000 | PACK | Freq: Once | INTRAVENOUS | Status: AC | PRN
  Administered 2021-01-22: 21.4 via INTRAVENOUS

## 2021-01-23 ENCOUNTER — Inpatient Hospital Stay: Payer: Medicare Other | Attending: Internal Medicine | Admitting: Internal Medicine

## 2021-01-23 ENCOUNTER — Inpatient Hospital Stay: Payer: Medicare Other

## 2021-01-23 DIAGNOSIS — Z5112 Encounter for antineoplastic immunotherapy: Secondary | ICD-10-CM | POA: Diagnosis not present

## 2021-01-23 DIAGNOSIS — M3501 Sicca syndrome with keratoconjunctivitis: Secondary | ICD-10-CM | POA: Insufficient documentation

## 2021-01-23 DIAGNOSIS — Z171 Estrogen receptor negative status [ER-]: Secondary | ICD-10-CM | POA: Diagnosis not present

## 2021-01-23 DIAGNOSIS — R6889 Other general symptoms and signs: Secondary | ICD-10-CM | POA: Diagnosis not present

## 2021-01-23 DIAGNOSIS — F0390 Unspecified dementia without behavioral disturbance: Secondary | ICD-10-CM | POA: Diagnosis not present

## 2021-01-23 DIAGNOSIS — C50812 Malignant neoplasm of overlapping sites of left female breast: Secondary | ICD-10-CM | POA: Insufficient documentation

## 2021-01-23 DIAGNOSIS — Z95828 Presence of other vascular implants and grafts: Secondary | ICD-10-CM

## 2021-01-23 LAB — CBC WITH DIFFERENTIAL/PLATELET
Abs Immature Granulocytes: 0.02 10*3/uL (ref 0.00–0.07)
Basophils Absolute: 0 10*3/uL (ref 0.0–0.1)
Basophils Relative: 0 %
Eosinophils Absolute: 0.2 10*3/uL (ref 0.0–0.5)
Eosinophils Relative: 3 %
HCT: 43.3 % (ref 36.0–46.0)
Hemoglobin: 13.8 g/dL (ref 12.0–15.0)
Immature Granulocytes: 0 %
Lymphocytes Relative: 18 %
Lymphs Abs: 1.2 10*3/uL (ref 0.7–4.0)
MCH: 29.8 pg (ref 26.0–34.0)
MCHC: 31.9 g/dL (ref 30.0–36.0)
MCV: 93.5 fL (ref 80.0–100.0)
Monocytes Absolute: 0.3 10*3/uL (ref 0.1–1.0)
Monocytes Relative: 5 %
Neutro Abs: 5 10*3/uL (ref 1.7–7.7)
Neutrophils Relative %: 74 %
Platelets: 262 10*3/uL (ref 150–400)
RBC: 4.63 MIL/uL (ref 3.87–5.11)
RDW: 13.3 % (ref 11.5–15.5)
WBC: 6.8 10*3/uL (ref 4.0–10.5)
nRBC: 0 % (ref 0.0–0.2)

## 2021-01-23 LAB — COMPREHENSIVE METABOLIC PANEL
ALT: 19 U/L (ref 0–44)
AST: 28 U/L (ref 15–41)
Albumin: 3.8 g/dL (ref 3.5–5.0)
Alkaline Phosphatase: 55 U/L (ref 38–126)
Anion gap: 8 (ref 5–15)
BUN: 15 mg/dL (ref 8–23)
CO2: 27 mmol/L (ref 22–32)
Calcium: 8.6 mg/dL — ABNORMAL LOW (ref 8.9–10.3)
Chloride: 102 mmol/L (ref 98–111)
Creatinine, Ser: 0.94 mg/dL (ref 0.44–1.00)
GFR, Estimated: >60 mL/min (ref >60)
Glucose, Bld: 205 mg/dL — ABNORMAL HIGH (ref 70–99)
Potassium: 3.6 mmol/L (ref 3.5–5.1)
Sodium: 137 mmol/L (ref 135–145)
Total Bilirubin: 0.6 mg/dL (ref 0.3–1.2)
Total Protein: 6.9 g/dL (ref 6.5–8.1)

## 2021-01-23 MED ORDER — DIPHENHYDRAMINE HCL 50 MG/ML IJ SOLN
12.5000 mg | Freq: Once | INTRAMUSCULAR | Status: AC
  Administered 2021-01-23: 12.5 mg via INTRAVENOUS
  Filled 2021-01-23: qty 1

## 2021-01-23 MED ORDER — HEPARIN SOD (PORK) LOCK FLUSH 100 UNIT/ML IV SOLN
INTRAVENOUS | Status: AC
  Filled 2021-01-23: qty 5

## 2021-01-23 MED ORDER — SODIUM CHLORIDE 0.9% FLUSH
10.0000 mL | INTRAVENOUS | Status: DC | PRN
  Filled 2021-01-23: qty 10

## 2021-01-23 MED ORDER — HEPARIN SOD (PORK) LOCK FLUSH 100 UNIT/ML IV SOLN
500.0000 [IU] | Freq: Once | INTRAVENOUS | Status: AC | PRN
  Administered 2021-01-23: 500 [IU]
  Filled 2021-01-23: qty 5

## 2021-01-23 MED ORDER — SODIUM CHLORIDE 0.9 % IV SOLN
420.0000 mg | Freq: Once | INTRAVENOUS | Status: AC
  Administered 2021-01-23: 420 mg via INTRAVENOUS
  Filled 2021-01-23: qty 14

## 2021-01-23 MED ORDER — ACETAMINOPHEN 325 MG PO TABS
650.0000 mg | ORAL_TABLET | Freq: Once | ORAL | Status: AC
  Administered 2021-01-23: 650 mg via ORAL
  Filled 2021-01-23: qty 2

## 2021-01-23 MED ORDER — HEPARIN SOD (PORK) LOCK FLUSH 100 UNIT/ML IV SOLN
500.0000 [IU] | Freq: Once | INTRAVENOUS | Status: DC
  Filled 2021-01-23: qty 5

## 2021-01-23 MED ORDER — SODIUM CHLORIDE 0.9% FLUSH
10.0000 mL | Freq: Once | INTRAVENOUS | Status: AC
Start: 2021-01-23 — End: 2021-01-23
  Administered 2021-01-23: 10 mL via INTRAVENOUS
  Filled 2021-01-23: qty 10

## 2021-01-23 MED ORDER — TRASTUZUMAB-ANNS CHEMO 150 MG IV SOLR
450.0000 mg | Freq: Once | INTRAVENOUS | Status: AC
  Administered 2021-01-23: 450 mg via INTRAVENOUS
  Filled 2021-01-23: qty 21.43

## 2021-01-23 MED ORDER — SODIUM CHLORIDE 0.9 % IV SOLN
Freq: Once | INTRAVENOUS | Status: AC
  Filled 2021-01-23: qty 250

## 2021-01-23 NOTE — Patient Instructions (Signed)
[facility]  Discharge Instructions: Thank you for choosing [facility] to provide your oncology and hematology care.  If you have a lab appointment with the Cancer Center, please go directly to the Cancer Center and check in at the registration area.  Wear comfortable clothing and clothing appropriate for easy access to any Portacath or PICC line.   We strive to give you quality time with your provider. You may need to reschedule your appointment if you arrive late (15 or more minutes).  Arriving late affects you and other patients whose appointments are after yours.  Also, if you miss three or more appointments without notifying the office, you may be dismissed from the clinic at the provider's discretion.      For prescription refill requests, have your pharmacy contact our office and allow 72 hours for refills to be completed.    Today you received the following chemotherapy and/or immunotherapy agents - trastuzumab, pertuzumab      To help prevent nausea and vomiting after your treatment, we encourage you to take your nausea medication as directed.  BELOW ARE SYMPTOMS THAT SHOULD BE REPORTED IMMEDIATELY: *FEVER GREATER THAN 100.4 F (38 C) OR HIGHER *CHILLS OR SWEATING *NAUSEA AND VOMITING THAT IS NOT CONTROLLED WITH YOUR NAUSEA MEDICATION *UNUSUAL SHORTNESS OF BREATH *UNUSUAL BRUISING OR BLEEDING *URINARY PROBLEMS (pain or burning when urinating, or frequent urination) *BOWEL PROBLEMS (unusual diarrhea, constipation, pain near the anus) TENDERNESS IN MOUTH AND THROAT WITH OR WITHOUT PRESENCE OF ULCERS (sore throat, sores in mouth, or a toothache) UNUSUAL RASH, SWELLING OR PAIN  UNUSUAL VAGINAL DISCHARGE OR ITCHING   Items with * indicate a potential emergency and should be followed up as soon as possible or go to the Emergency Department if any problems should occur.  Please show the CHEMOTHERAPY ALERT CARD or IMMUNOTHERAPY ALERT  CARD at check-in to the Emergency Department and triage nurse.  Should you have questions after your visit or need to cancel or reschedule your appointment, please contact [facility]  [phone] and follow the prompts.  Office hours are 8:00 a.m. to 4:30 p.m. Monday - Friday. Please note that voicemails left after 4:00 p.m. may not be returned until the following business day.  We are closed weekends and major holidays. You have access to a nurse at all times for urgent questions. Please call the main number to the clinic [phone] and follow the prompts.  For any non-urgent questions, you may also contact your provider using MyChart. We now offer e-Visits for anyone 18 and older to request care online for non-urgent symptoms. For details visit [email or web address].   Also download the MyChart app! Go to the app store, search "MyChart", open the app, select [facility], and log in with your MyChart username and password.  Due to Covid, a mask is required upon entering the hospital/clinic. If you do not have a mask, one will be given to you upon arrival. For doctor visits, patients may have 1 support person aged 18 or older with them. For treatment visits, patients cannot have anyone with them due to current Covid guidelines and our immunocompromised population.   Trastuzumab injection for infusion What is this medication? TRASTUZUMAB (tras TOO zoo mab) is a monoclonal antibody. It is used to treatbreast cancer and stomach cancer. This medicine may be used for other purposes; ask your health care provider orpharmacist if you have questions. COMMON BRAND NAME(S): Herceptin, Herzuma, KANJINTI, Ogivri, Ontruzant, Trazimera   What should I tell my care team before I take this medication? They need to know if you have any of these conditions: heart disease heart failure lung or breathing disease, like asthma an unusual or allergic reaction to trastuzumab,  benzyl alcohol, or other medications, foods, dyes, or preservatives pregnant or trying to get pregnant breast-feeding How should I use this medication? This drug is given as an infusion into a vein. It is administered in a hospitalor clinic by a specially trained health care professional. Talk to your pediatrician regarding the use of this medicine in children. Thismedicine is not approved for use in children. Overdosage: If you think you have taken too much of this medicine contact apoison control center or emergency room at once. NOTE: This medicine is only for you. Do not share this medicine with others. What if I miss a dose? It is important not to miss a dose. Call your doctor or health careprofessional if you are unable to keep an appointment. What may interact with this medication? This medicine may interact with the following medications: certain types of chemotherapy, such as daunorubicin, doxorubicin, epirubicin, and idarubicin This list may not describe all possible interactions. Give your health care provider a list of all the medicines, herbs, non-prescription drugs, or dietary supplements you use. Also tell them if you smoke, drink alcohol, or use illegaldrugs. Some items may interact with your medicine. What should I watch for while using this medication? Visit your doctor for checks on your progress. Report any side effects. Continue your course of treatment even though you feel ill unless your doctortells you to stop. Call your doctor or health care professional for advice if you get a fever, chills or sore throat, or other symptoms of a cold or flu. Do not treatyourself. Try to avoid being around people who are sick. You may experience fever, chills and shaking during your first infusion. These effects are usually mild and can be treated with other medicines. Report any side effects during the infusion to your health care professional. Fever andchills usually do not happen with  later infusions. Do not become pregnant while taking this medicine or for 7 months after stopping it. Women should inform their doctor if they wish to become pregnant or think they might be pregnant. Women of child-bearing potential will need to have a negative pregnancy test before starting this medicine. There is a potential for serious side effects to an unborn child. Talk to your health care professional or pharmacist for more information. Do not breast-feed an infantwhile taking this medicine or for 7 months after stopping it. Women must use effective birth control with this medicine. What side effects may I notice from receiving this medication? Side effects that you should report to your doctor or health care professionalas soon as possible: allergic reactions like skin rash, itching or hives, swelling of the face, lips, or tongue chest pain or palpitations cough dizziness feeling faint or lightheaded, falls fever general ill feeling or flu-like symptoms signs of worsening heart failure like breathing problems; swelling in your legs and feet unusually weak or tired Side effects that usually do not require medical attention (report to yourdoctor or health care professional if they continue or are bothersome): bone pain changes in taste diarrhea joint pain nausea/vomiting weight loss This list may not describe all possible side effects. Call your doctor for medical advice about side effects. You may report side effects to FDA at1-800-FDA-1088. Where should I keep my medication? This drug is   given in a hospital or clinic and will not be stored at home. NOTE: This sheet is a summary. It may not cover all possible information. If you have questions about this medicine, talk to your doctor, pharmacist, orhealth care provider.  2022 Elsevier/Gold Standard (2016-07-08 14:37:52)  Pertuzumab injection What is this medication? PERTUZUMAB (per TOOZ ue mab) is a monoclonal antibody. It is  used to treatbreast cancer. This medicine may be used for other purposes; ask your health care provider orpharmacist if you have questions. COMMON BRAND NAME(S): PERJETA What should I tell my care team before I take this medication? They need to know if you have any of these conditions: heart disease heart failure high blood pressure history of irregular heart beat recent or ongoing radiation therapy an unusual or allergic reaction to pertuzumab, other medicines, foods, dyes, or preservatives pregnant or trying to get pregnant breast-feeding How should I use this medication? This medicine is for infusion into a vein. It is given by a health careprofessional in a hospital or clinic setting. Talk to your pediatrician regarding the use of this medicine in children.Special care may be needed. Overdosage: If you think you have taken too much of this medicine contact apoison control center or emergency room at once. NOTE: This medicine is only for you. Do not share this medicine with others. What if I miss a dose? It is important not to miss your dose. Call your doctor or health careprofessional if you are unable to keep an appointment. What may interact with this medication? Interactions are not expected. Give your health care provider a list of all the medicines, herbs, non-prescription drugs, or dietary supplements you use. Also tell them if you smoke, drink alcohol, or use illegal drugs. Some items may interact with yourmedicine. This list may not describe all possible interactions. Give your health care provider a list of all the medicines, herbs, non-prescription drugs, or dietary supplements you use. Also tell them if you smoke, drink alcohol, or use illegaldrugs. Some items may interact with your medicine. What should I watch for while using this medication? Your condition will be monitored carefully while you are receiving this medicine. Report any side effects. Continue your course of  treatment eventhough you feel ill unless your doctor tells you to stop. Do not become pregnant while taking this medicine or for 7 months after stopping it. Women should inform their doctor if they wish to become pregnant or think they might be pregnant. Women of child-bearing potential will need to have a negative pregnancy test before starting this medicine. There is a potential for serious side effects to an unborn child. Talk to your health care professional or pharmacist for more information. Do not breast-feed an infantwhile taking this medicine or for 7 months after stopping it. Women must use effective birth control with this medicine. Call your doctor or health care professional for advice if you get a fever, chills or sore throat, or other symptoms of a cold or flu. Do not treatyourself. Try to avoid being around people who are sick. You may experience fever, chills, and headache during the infusion. Report anyside effects during the infusion to your health care professional. What side effects may I notice from receiving this medication? Side effects that you should report to your doctor or health care professionalas soon as possible: breathing problems chest pain or palpitations dizziness feeling faint or lightheaded fever or chills skin rash, itching or hives sore throat swelling of the face, lips, or tongue   swelling of the legs or ankles unusually weak or tired Side effects that usually do not require medical attention (report to yourdoctor or health care professional if they continue or are bothersome): diarrhea hair loss nausea, vomiting tiredness This list may not describe all possible side effects. Call your doctor for medical advice about side effects. You may report side effects to FDA at1-800-FDA-1088. Where should I keep my medication? This drug is given in a hospital or clinic and will not be stored at home. NOTE: This sheet is a summary. It may not cover all possible  information. If you have questions about this medicine, talk to your doctor, pharmacist, orhealth care provider.  2022 Elsevier/Gold Standard (2015-08-16 12:08:50)  

## 2021-01-23 NOTE — Assessment & Plan Note (Signed)
#  Metastatic breast cancer ER [name] HER-2/neu positive. MAY 2022-  CT scan chest and pelvis NED-  STABLE;   sclerosis of the manubrium.  STABLE.   # Re-START with Herceptin-perjeta. Labs today reviewed;  acceptable for treatment today.   # Drop in ejection fraction-January 09, 2020 MUGA scan ejection fraction 47%; AUG 2021- MUGA scan- 61%. NOV 2021- EF-55% [2 months break- DEC-FEB, 2022]; July 2022- STABLE at 52.5%   # dry eyes/ keratitis sicca- *herceptin-perjeta; not sure if casutive factor.  Continue moisturizing eyedrops.  STABLE.   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    # Elevated BG-post breakfast in the range of 1 80-200; STABLE  HbA1c-5.6. recommend referral to nutrition.    DISPOSITION:  # referral to nutrition re: pre-diabetes/ #Herceptin-Perjeta today # Follow up-in 3 weeks MD; labs-cbc/cmp; Herceptin-Perjeta;-Dr.B

## 2021-01-23 NOTE — Progress Notes (Signed)
[facility] OFFICE PROGRESS NOTE  Patient Care Team: [name], MD as PCP - General (Internal Medicine) Bary Castilla, [name], MD (General Surgery) Requested, Self Cammie Sickle, MD as Consulting Physician (Internal Medicine)  Cancer Staging No matching staging information was found for the patient.   Oncology History Overview Note  # June 2016- LEFT BREAST CA;  [name] ; [name] ; [name]; Her 2 Neu POS; [place] from July OF 2016; [name]; adjuvant Herceptin [ Finished July 2017]; AUG 2017- Neratinib x5 days; DISCON sec to diarrhea  # MID OCT 2018- Right breast mass-Bx- ER/PR-NEG; Her 2 NEU POSITIVE 1~2.5cm;  [?NEW primary]  # MID-OCT 2018-METASTATIC RECURRENT-oh sternal mass; Left Ax LN [Bx]/periportal LN  # OCT 25th 2018- TAXOL-HERCEPTIN-PERJETA; Jan 2019- CT PR; continue HP only; on HOLD since NOV 2019--sec to drop in EF/Sieziues  # July 15th 2020- RE-STARTED HP   -------------------------------------------------------------------  # MUGA scan- July 28th 2017- 67%.  December 2019-EF 53%; January 2020 EF-52%  # chronic gait/balance issues  # Seizures/petit-mal/ Dr.Shah-Keppra [name] [id number]]  # June 2017- left breast Bx- fat necrosis [[name]  # july 2017-  BRCA 1& 2- NEG.   MOLECULAR TESTING- F ONE- TPS- 0%;  ERB2 amplification; PI3K/RET amplification Others**  # PALLIATIVE CARE: P  --------------------------------------------------    DIAGNOSIS: [ OCT 1478]- REC/MET- BREAST CA ER/PR-NEG; her 2 POS  STAGE: 4  ;GOALS: Palliative  CURRENT/MOST RECENT THERAPY- Herceptin-Perjeta [C]    Carcinoma of overlapping sites of left breast in female, estrogen receptor negative ([place])     INTERVAL HISTORY: Patient a poor historian given dementia.  Patient is accompanied by her husband today.   [name] 80 y.o.  female pleasant patient above history of metastatic breast cancer on  Herceptin plus Perjeta-is here for follow-up/review results of the CT scan/MUGA scan  Herceptin Perjeta was held because of drop in ejection fraction.  No swelling in the legs.  No unusual weight gain or weight loss.  No cough or shortness of breath.  Chronic back pain.  Chronic unsteady gait.  No falls.   Chronic dry itchy eyes using moisturizing lotion.  Review of Systems  Unable to perform ROS: Dementia   PAST MEDICAL HISTORY :  Past Medical History:  Diagnosis Date  . Arthritis   . Brain tumor ([place]) 1995   meningeoma  . Breast cancer ([place]) 2018   left breast; surgery 01/11/15 with Dr. Bary Castilla; path with invasive mammary and DCIS  . Breast cancer of upper-inner quadrant of left female breast ([place]) 12/15/14   Completed radiation end of December and finished chemotherapy 2 weeks ago, Left breast invasive mammary carcinoma, T1cN82mic (1.5 cm); Grade 3, [name], [name], [name], .  Marland Kitchen Cataract    bilat   . DDD (degenerative disc disease), lumbar    Lumbar, previously evaluated by Dr. Earnestine Leys  . Dementia ([place])   . Fibrocystic breast disease    prior biopsy  . Glaucoma   . 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 Hearing loss   . History of cancer chemotherapy   . History of radiation therapy   . Hyperlipidemia, unspecified   . Imbalance   . Memory impairment    seen by Dr Manuella Ghazi  possible post crainiotomy from radiation  . 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  . Numbness and tingling    right hand   . Osteoarthritis   . Osteoporosis, post-menopausal   . Personal history of chemotherapy   . Personal history of radiation therapy   . Shoulder pain, right   . Wears glasses     PAST SURGICAL HISTORY :   Past Surgical History:  Procedure Laterality Date  . APPENDECTOMY  1950  . BRAIN SURGERY  1995   left frontal/temporal  . BREAST BIOPSY Left  12/15/14   confirmed DCIS  . BREAST BIOPSY Left 01/08/2015   Procedure: BREAST BIOPSY WITH NEEDLE LOCALIZATION;  Surgeon: [name], MD;  Location: [facility] ORS;  Service: General;  Laterality: Left;  . BREAST EXCISIONAL BIOPSY Left 1997  . BREAST LUMPECTOMY Left 01/08/2015   Procedure: LUMPECTOMY;  Surgeon: [name], MD;  Location: [facility] OR[facility];  Service: General;  Laterality: Left;  . COLONOSCOPY  2010   Dr. [name]  . ORIF ANKLE FRACTURE Right 08/05/2017   Procedure: OPEN REDUCTION INTERNAL FIXATION (ORIF) ANKLE FRACTURE;  Surgeon: Earnestine Leys, MD;  Location: [facility] OR[facility];  Service: Orthopedics;  Laterality: Right;  . PORTACATH PLACEMENT Right 01/16/2015   Procedure: INSERTION PORT-A-CATH;  Surgeon: [name], MD;  Location: [facility] OR[facility];  Service: General;  Laterality: Right;  . SENTINEL NODE BIOPSY Left 01/16/2015   Procedure: SENTINEL NODE BIOPSY;  Surgeon: [name], MD;  Location: [facility] OR[facility];  Service: General;  Laterality: Left;  . TOTAL HIP ARTHROPLASTY Right 09/04/2015   Procedure: RIGHT TOTAL HIP ARTHROPLASTY ANTERIOR APPROACH;  Surgeon: [name], MD;  Location: [facility];  Service: Orthopedics;  Laterality: Right;    FAMILY HISTORY :   Family History  Problem Relation Age of Onset  . Breast cancer Sister 44  . Addison's disease Mother   . Hyperthyroidism Mother   . Osteoarthritis Mother   . Stroke Father   . Heart disease Father   . High blood pressure Father     SOCIAL HISTORY:   Social History   Tobacco Use  . Smoking status: Former    Packs/day: 0.25    Years: 5.00    Pack years: 1.25    Types: Cigarettes    Quit date: 07/28/1972    Years since quitting: 48.5  . Smokeless tobacco: Never  Vaping Use  . Vaping Use: Never used  Substance Use Topics  . Alcohol use: Yes    Alcohol/week: 1.0 - 2.0 standard drink    Types: 1 - 2 Glasses of wine per week    Comment: 1 Glass Wine / Night  . Drug use: No    ALLERGIES:  is allergic to  donepezil.  MEDICATIONS:  Current Outpatient Medications  Medication Sig Dispense Refill  . acetaminophen (TYLENOL) 325 MG tablet Take 650 mg by mouth every 4 (four) hours as needed. Pain / increased temp.    . ARTIFICIAL TEARS 1 % ophthalmic solution Place 1 drop into both eyes daily.    Marland Kitchen ascorbic acid (VITAMIN C) 500 MG tablet Take 1 tablet by mouth daily.    . Calcium Carbonate-Vitamin D (CALTRATE 600+D PO) Take 1 tablet by mouth daily.    . celecoxib (CELEBREX) 200 MG capsule Take 200 mg by mouth daily.     Marland Kitchen levETIRAcetam (KEPPRA) 250 MG tablet 1 tablet daily at bedtime  5  . traMADol (ULTRAM) 50 MG tablet Take 1 tablet (50 mg total) by mouth 2 (two) times daily as needed for moderate pain. 60 tablet  0  . Travoprost, BAK Free, (TRAVATAN) 0.004 % SOLN ophthalmic solution travoprost 0.004 % eye drops    . vitamin E 400 UNIT capsule Take 400 Units by mouth daily.     Marland Kitchen COVID-19 mRNA Vac-TriS, Pfizer, (PFIZER-BIONT COVID-19 VAC-TRIS) SUSP injection Inject into the muscle. 0.3 mL 0   No current facility-administered medications for this visit.   Facility-Administered Medications Ordered in Other Visits  Medication Dose Route Frequency Provider Last Rate Last Admin  . heparin lock flush 100 unit/mL  500 Units Intracatheter Once PRN Cammie Sickle, MD      . pertuzumab (PERJETA) 420 mg in sodium chloride 0.9 % 250 mL chemo infusion  420 mg Intravenous Once Charlaine Dalton R, MD 528 mL/hr at 01/23/21 1021 420 mg at 01/23/21 1021  . sodium chloride flush (NS) 0.9 % injection 10 mL  10 mL Intravenous PRN Cammie Sickle, MD   10 mL at 07/07/18 0830    PHYSICAL EXAMINATION: ECOG PERFORMANCE STATUS: 2 - Symptomatic, <50% confined to bed  BP 120/68   Pulse 85   Resp 20   Wt 173 lb (78.5 kg)   SpO2 97%   BMI 28.79 kg/m   Filed Weights   01/23/21 0834  Weight: 173 lb (78.5 kg)     Physical Exam Constitutional:      Comments: Patient is accompanied by husband.  In a  wheelchair.  HENT:     Head: Normocephalic and atraumatic.     Mouth/Throat:     Pharynx: No oropharyngeal exudate.  Eyes:     Pupils: Pupils are equal, round, and reactive to light.  Cardiovascular:     Rate and Rhythm: Normal rate and regular rhythm.  Pulmonary:     Effort: Pulmonary effort is normal. No respiratory distress.     Breath sounds: Normal breath sounds. No wheezing.  Abdominal:     General: Bowel sounds are normal. There is no distension.     Palpations: Abdomen is soft. There is no mass.     Tenderness: There is no abdominal tenderness. There is no guarding or rebound.  Musculoskeletal:        General: No tenderness. Normal range of motion.     Cervical back: Normal range of motion and neck supple.  Skin:    General: Skin is warm.  Neurological:     Mental Status: She is alert and oriented to person, place, and time.  Psychiatric:        Mood and Affect: Affect normal.    LABORATORY DATA:  I have reviewed the data as listed    Component Value Date/Time   NA 137 01/23/2021 0814   K 3.6 01/23/2021 0814   CL 102 01/23/2021 0814   CO2 27 01/23/2021 0814   GLUCOSE 205 (H) 01/23/2021 0814   BUN 15 01/23/2021 0814   CREATININE 0.94 01/23/2021 0814   CALCIUM 8.6 (L) 01/23/2021 0814   PROT 6.9 01/23/2021 0814   ALBUMIN 3.8 01/23/2021 0814   AST 28 01/23/2021 0814   ALT 19 01/23/2021 0814   ALKPHOS 55 01/23/2021 0814   BILITOT 0.6 01/23/2021 0814   GFRNONAA >60 01/23/2021 0814   GFRAA >60 04/16/2020 0814    No results found for: SPEP, UPEP  Lab Results  Component Value Date   WBC 6.8 01/23/2021   NEUTROABS 5.0 01/23/2021   HGB 13.8 01/23/2021   HCT 43.3 01/23/2021   MCV 93.5 01/23/2021   PLT 262 01/23/2021      Chemistry  Component Value Date/Time   NA 137 01/23/2021 0814   K 3.6 01/23/2021 0814   CL 102 01/23/2021 0814   CO2 27 01/23/2021 0814   BUN 15 01/23/2021 0814   CREATININE 0.94 01/23/2021 0814      Component Value Date/Time    CALCIUM 8.6 (L) 01/23/2021 0814   ALKPHOS 55 01/23/2021 0814   AST 28 01/23/2021 0814   ALT 19 01/23/2021 0814   BILITOT 0.6 01/23/2021 0814       RADIOGRAPHIC STUDIES: I have personally reviewed the radiological images as listed and agreed with the findings in the report. NM Cardiac [place] Rest  Result Date: 01/22/2021 CLINICAL DATA:  Breast cancer. Evaluate cardiac function in relation to chemotherapy. EXAM: NUCLEAR MEDICINE CARDIAC BLOOD POOL IMAGING (MUGA) TECHNIQUE: Cardiac multi-gated acquisition was performed at rest following intravenous injection of Tc-67mlabeled red blood cells. RADIOPHARMACEUTICALS:  21.4 mCi Tc-948mertechnetate in-vitro labeled red blood cells IV COMPARISON:  [place] scan 11/27/2020 06/15/2020 FINDINGS: No  focal wall motion abnormality of the left ventricle. Calculated left ventricular ejection fraction equals 52.2 %. Comparable to 52.9 % on 11/27/2020 IMPRESSION: Left ventricular ejection fraction equals52.2 %. Electronically Signed   By: StSuzy Bouchard.D.   On: 01/22/2021 14:36     ASSESSMENT & PLAN:  Carcinoma of overlapping sites of left breast in female, estrogen receptor negative ([place]#Metastatic breast cancer ER [name] HER-2/neu positive. MAY 2022-  CT scan chest and pelvis NED-  STABLE;   sclerosis of the manubrium.  STABLE.   # Re-START with Herceptin-perjeta. Labs today reviewed;  acceptable for treatment today.   # Drop in ejection fraction-January 09, 2020 MUGA scan ejection fraction 47%; AUG 2021- MUGA scan- 61%. NOV 2021- EF-55% [2 months break- DEC-FEB, 2022]; July 2022- STABLE at 52.5%   # dry eyes/ keratitis sicca- *herceptin-perjeta; not sure if casutive factor.  Continue moisturizing eyedrops.  STABLE.   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    # Elevated BG-post breakfast in the range of 1 80-200; STABLE  HbA1c-5.6. recommend referral to nutrition.    DISPOSITION:  # referral to  nutrition re: pre-diabetes/ #Herceptin-Perjeta today # Follow up-in 3 weeks MD; labs-cbc/cmp; Herceptin-Perjeta;-Dr.B   No orders of the defined types were placed in this encounter.  All questions were answered. The patient knows to call the clinic with any problems, questions or concerns.      GoCammie SickleMD 01/23/2021 10:37 AM

## 2021-01-29 ENCOUNTER — Other Ambulatory Visit: Payer: Self-pay

## 2021-01-29 ENCOUNTER — Emergency Department: Payer: Medicare Other

## 2021-01-29 ENCOUNTER — Inpatient Hospital Stay
Admission: EM | Admit: 2021-01-29 | Discharge: 2021-01-31 | DRG: 871 | Disposition: A | Discharge status: Home Health | Payer: Medicare Other | Attending: Student | Admitting: Student

## 2021-01-29 DIAGNOSIS — R6520 Severe sepsis without septic shock: Secondary | ICD-10-CM | POA: Diagnosis present

## 2021-01-29 DIAGNOSIS — A419 Sepsis, unspecified organism: Secondary | ICD-10-CM | POA: Diagnosis present

## 2021-01-29 DIAGNOSIS — Z9221 Personal history of antineoplastic chemotherapy: Secondary | ICD-10-CM

## 2021-01-29 DIAGNOSIS — J069 Acute upper respiratory infection, unspecified: Secondary | ICD-10-CM | POA: Diagnosis present

## 2021-01-29 DIAGNOSIS — E785 Hyperlipidemia, unspecified: Secondary | ICD-10-CM | POA: Diagnosis present

## 2021-01-29 DIAGNOSIS — W19XXXA Unspecified fall, initial encounter: Secondary | ICD-10-CM

## 2021-01-29 DIAGNOSIS — U071 COVID-19: Secondary | ICD-10-CM | POA: Diagnosis present

## 2021-01-29 DIAGNOSIS — G40A09 Absence epileptic syndrome, not intractable, without status epilepticus: Secondary | ICD-10-CM | POA: Diagnosis present

## 2021-01-29 DIAGNOSIS — Z86011 Personal history of benign neoplasm of the brain: Secondary | ICD-10-CM | POA: Diagnosis not present

## 2021-01-29 DIAGNOSIS — Z853 Personal history of malignant neoplasm of breast: Secondary | ICD-10-CM | POA: Diagnosis not present

## 2021-01-29 DIAGNOSIS — G8929 Other chronic pain: Secondary | ICD-10-CM | POA: Diagnosis present

## 2021-01-29 DIAGNOSIS — C773 Secondary and unspecified malignant neoplasm of axilla and upper limb lymph nodes: Secondary | ICD-10-CM | POA: Diagnosis present

## 2021-01-29 DIAGNOSIS — Z96641 Presence of right artificial hip joint: Secondary | ICD-10-CM | POA: Diagnosis present

## 2021-01-29 DIAGNOSIS — Z87891 Personal history of nicotine dependence: Secondary | ICD-10-CM

## 2021-01-29 DIAGNOSIS — F0390 Unspecified dementia without behavioral disturbance: Secondary | ICD-10-CM | POA: Diagnosis present

## 2021-01-29 DIAGNOSIS — M5136 Other intervertebral disc degeneration, lumbar region: Secondary | ICD-10-CM | POA: Diagnosis present

## 2021-01-29 DIAGNOSIS — R531 Weakness: Secondary | ICD-10-CM | POA: Diagnosis present

## 2021-01-29 DIAGNOSIS — D329 Benign neoplasm of meninges, unspecified: Secondary | ICD-10-CM | POA: Diagnosis present

## 2021-01-29 DIAGNOSIS — Z803 Family history of malignant neoplasm of breast: Secondary | ICD-10-CM

## 2021-01-29 DIAGNOSIS — M810 Age-related osteoporosis without current pathological fracture: Secondary | ICD-10-CM | POA: Diagnosis present

## 2021-01-29 DIAGNOSIS — Z515 Encounter for palliative care: Secondary | ICD-10-CM

## 2021-01-29 DIAGNOSIS — A4189 Other specified sepsis: Principal | ICD-10-CM | POA: Diagnosis present

## 2021-01-29 DIAGNOSIS — Z923 Personal history of irradiation: Secondary | ICD-10-CM

## 2021-01-29 DIAGNOSIS — G9341 Metabolic encephalopathy: Secondary | ICD-10-CM | POA: Diagnosis present

## 2021-01-29 DIAGNOSIS — Z171 Estrogen receptor negative status [ER-]: Secondary | ICD-10-CM | POA: Diagnosis not present

## 2021-01-29 LAB — URINALYSIS, COMPLETE (UACMP) WITH MICROSCOPIC
Bilirubin Urine: NEGATIVE
Glucose, UA: NEGATIVE mg/dL
Ketones, ur: NEGATIVE mg/dL
Leukocytes,Ua: NEGATIVE
Nitrite: NEGATIVE
Protein, ur: NEGATIVE mg/dL
Specific Gravity, Urine: 1.015 (ref 1.005–1.030)
Squamous Epithelial / HPF: NONE SEEN (ref 0–5)
pH: 5 (ref 5.0–8.0)

## 2021-01-29 LAB — CBC WITH DIFFERENTIAL/PLATELET
Abs Immature Granulocytes: 0.06 10*3/uL (ref 0.00–0.07)
Basophils Absolute: 0 10*3/uL (ref 0.0–0.1)
Basophils Relative: 0 %
Eosinophils Absolute: 0.1 10*3/uL (ref 0.0–0.5)
Eosinophils Relative: 1 %
HCT: 45.2 % (ref 36.0–46.0)
Hemoglobin: 14.3 g/dL (ref 12.0–15.0)
Immature Granulocytes: 1 %
Lymphocytes Relative: 5 %
Lymphs Abs: 0.7 10*3/uL (ref 0.7–4.0)
MCH: 29.9 pg (ref 26.0–34.0)
MCHC: 31.6 g/dL (ref 30.0–36.0)
MCV: 94.4 fL (ref 80.0–100.0)
Monocytes Absolute: 0.8 10*3/uL (ref 0.1–1.0)
Monocytes Relative: 6 %
Neutro Abs: 11.6 10*3/uL — ABNORMAL HIGH (ref 1.7–7.7)
Neutrophils Relative %: 87 %
Platelets: 266 10*3/uL (ref 150–400)
RBC: 4.79 MIL/uL (ref 3.87–5.11)
RDW: 13.3 % (ref 11.5–15.5)
WBC: 13.2 10*3/uL — ABNORMAL HIGH (ref 4.0–10.5)
nRBC: 0 % (ref 0.0–0.2)

## 2021-01-29 LAB — COMPREHENSIVE METABOLIC PANEL
ALT: 22 U/L (ref 0–44)
AST: 22 U/L (ref 15–41)
Albumin: 4.1 g/dL (ref 3.5–5.0)
Alkaline Phosphatase: 56 U/L (ref 38–126)
Anion gap: 9 (ref 5–15)
BUN: 18 mg/dL (ref 8–23)
CO2: 29 mmol/L (ref 22–32)
Calcium: 8.9 mg/dL (ref 8.9–10.3)
Chloride: 99 mmol/L (ref 98–111)
Creatinine, Ser: 0.83 mg/dL (ref 0.44–1.00)
GFR, Estimated: >60 mL/min (ref >60)
Glucose, Bld: 134 mg/dL — ABNORMAL HIGH (ref 70–99)
Potassium: 3.7 mmol/L (ref 3.5–5.1)
Sodium: 137 mmol/L (ref 135–145)
Total Bilirubin: 0.7 mg/dL (ref 0.3–1.2)
Total Protein: 7.3 g/dL (ref 6.5–8.1)

## 2021-01-29 LAB — LACTATE DEHYDROGENASE: LDH: 154 U/L (ref 98–192)

## 2021-01-29 LAB — TYPE AND SCREEN
ABO/RH(D): A POS
Antibody Screen: NEGATIVE

## 2021-01-29 LAB — CBG MONITORING, ED: Glucose-Capillary: 132 mg/dL — ABNORMAL HIGH (ref 70–99)

## 2021-01-29 LAB — BRAIN NATRIURETIC PEPTIDE: B Natriuretic Peptide: 8.2 pg/mL (ref 0.0–100.0)

## 2021-01-29 LAB — RESP PANEL BY RT-PCR (FLU A&B, COVID) ARPGX2
Influenza A by PCR: NEGATIVE
Influenza B by PCR: NEGATIVE
SARS Coronavirus 2 by RT PCR: POSITIVE — AB

## 2021-01-29 LAB — LACTIC ACID, PLASMA
Lactic Acid, Venous: 3.3 mmol/L (ref 0.5–1.9)
Lactic Acid, Venous: 3.6 mmol/L (ref 0.5–1.9)
Lactic Acid, Venous: 2.8 mmol/L (ref 0.5–1.9)
Lactic Acid, Venous: 2.9 mmol/L (ref 0.5–1.9)

## 2021-01-29 LAB — HEPATITIS B SURFACE ANTIGEN: Hepatitis B Surface Ag: NONREACTIVE

## 2021-01-29 LAB — FIBRINOGEN: Fibrinogen: 422 mg/dL (ref 210–475)

## 2021-01-29 LAB — D-DIMER, QUANTITATIVE: D-Dimer, Quant: 2.96 ug/mL-FEU — ABNORMAL HIGH (ref 0.00–0.50)

## 2021-01-29 LAB — PROCALCITONIN: Procalcitonin: <0.1 ng/mL

## 2021-01-29 LAB — C-REACTIVE PROTEIN: CRP: 1.1 mg/dL — ABNORMAL HIGH (ref <1.0)

## 2021-01-29 LAB — TROPONIN I (HIGH SENSITIVITY)
Troponin I (High Sensitivity): 3 ng/L (ref <18)
Troponin I (High Sensitivity): 3 ng/L (ref <18)

## 2021-01-29 LAB — FERRITIN: Ferritin: 92 ng/mL (ref 11–307)

## 2021-01-29 MED ORDER — METHYLPREDNISOLONE SODIUM SUCC 125 MG IJ SOLR
1.0000 mg/kg | Freq: Two times a day (BID) | INTRAMUSCULAR | Status: DC
  Administered 2021-01-29: 73.125 mg via INTRAVENOUS
  Filled 2021-01-29: qty 2

## 2021-01-29 MED ORDER — ASCORBIC ACID 500 MG PO TABS
500.0000 mg | ORAL_TABLET | Freq: Every day | ORAL | Status: DC
  Administered 2021-01-30 – 2021-01-31 (×2): 500 mg via ORAL
  Filled 2021-01-29 (×2): qty 1

## 2021-01-29 MED ORDER — ACETAMINOPHEN 325 MG PO TABS: 650.0000 mg | ORAL_TABLET | Freq: Four times a day (QID) | ORAL | Status: DC | PRN

## 2021-01-29 MED ORDER — SODIUM CHLORIDE 0.9 % IV BOLUS
1000.0000 mL | Freq: Once | INTRAVENOUS | Status: AC
  Administered 2021-01-29: 1000 mL via INTRAVENOUS

## 2021-01-29 MED ORDER — SODIUM CHLORIDE 0.9 % IV SOLN
100.0000 mg | Freq: Every day | INTRAVENOUS | Status: DC
  Administered 2021-01-30 – 2021-01-31 (×2): 100 mg via INTRAVENOUS
  Filled 2021-01-29: qty 20
  Filled 2021-01-29: qty 100

## 2021-01-29 MED ORDER — ALBUTEROL SULFATE HFA 108 (90 BASE) MCG/ACT IN AERS: 2.0000 | INHALATION_SPRAY | RESPIRATORY_TRACT | 0 refills | Status: DC | PRN

## 2021-01-29 MED ORDER — SODIUM CHLORIDE 0.9 % IV SOLN
100.0000 mg | Freq: Once | INTRAVENOUS | Status: AC
  Administered 2021-01-29: 100 mg via INTRAVENOUS
  Filled 2021-01-29 (×3): qty 20

## 2021-01-29 MED ORDER — NIRMATRELVIR/RITONAVIR (PAXLOVID)TABLET: 3.0000 | ORAL_TABLET | Freq: Two times a day (BID) | ORAL | 0 refills | Status: AC

## 2021-01-29 MED ORDER — LORAZEPAM 2 MG/ML IJ SOLN: 1.0000 mg | INTRAMUSCULAR | Status: DC | PRN

## 2021-01-29 MED ORDER — PREDNISONE 20 MG PO TABS: 50.0000 mg | ORAL_TABLET | Freq: Every day | ORAL | Status: DC

## 2021-01-29 MED ORDER — METHYLPREDNISOLONE SODIUM SUCC 40 MG IJ SOLR
40.0000 mg | Freq: Two times a day (BID) | INTRAMUSCULAR | Status: DC
  Administered 2021-01-29 – 2021-01-31 (×4): 40 mg via INTRAVENOUS
  Filled 2021-01-29 (×4): qty 1

## 2021-01-29 MED ORDER — ONDANSETRON HCL 4 MG/2ML IJ SOLN
4.0000 mg | Freq: Three times a day (TID) | INTRAMUSCULAR | Status: DC | PRN
Start: 2021-01-29 — End: 2021-01-31

## 2021-01-29 MED ORDER — TRAMADOL HCL 50 MG PO TABS: 50.0000 mg | ORAL_TABLET | Freq: Two times a day (BID) | ORAL | Status: DC | PRN

## 2021-01-29 MED ORDER — ARTIFICIAL TEARS OPHTHALMIC OINT
TOPICAL_OINTMENT | Freq: Every evening | OPHTHALMIC | Status: DC | PRN
  Filled 2021-01-29: qty 3.5

## 2021-01-29 MED ORDER — SODIUM CHLORIDE 0.9 % IV BOLUS
500.0000 mL | Freq: Once | INTRAVENOUS | Status: AC
  Administered 2021-01-29: 500 mL via INTRAVENOUS

## 2021-01-29 MED ORDER — SODIUM CHLORIDE 0.9 % IV SOLN: INTRAVENOUS | Status: DC

## 2021-01-29 MED ORDER — LATANOPROST 0.005 % OP SOLN
1.0000 [drp] | Freq: Every day | OPHTHALMIC | Status: DC
  Administered 2021-01-29 – 2021-01-30 (×2): 1 [drp] via OPHTHALMIC
  Filled 2021-01-29 (×2): qty 2.5

## 2021-01-29 MED ORDER — DM-GUAIFENESIN ER 30-600 MG PO TB12
1.0000 | ORAL_TABLET | Freq: Two times a day (BID) | ORAL | Status: DC | PRN
  Administered 2021-01-29: 21:00:00 1 via ORAL
  Filled 2021-01-29: qty 1

## 2021-01-29 MED ORDER — AZITHROMYCIN 500 MG PO TABS
500.0000 mg | ORAL_TABLET | Freq: Every day | ORAL | Status: AC
  Administered 2021-01-29: 500 mg via ORAL
  Filled 2021-01-29: qty 1

## 2021-01-29 MED ORDER — IPRATROPIUM BROMIDE HFA 17 MCG/ACT IN AERS
2.0000 | INHALATION_SPRAY | RESPIRATORY_TRACT | Status: DC
  Administered 2021-01-29 – 2021-01-31 (×13): 2 via RESPIRATORY_TRACT
  Filled 2021-01-29 (×2): qty 12.9

## 2021-01-29 MED ORDER — SODIUM CHLORIDE 0.9 % IV SOLN: 100.0000 mg | Freq: Every day | INTRAVENOUS | Status: DC

## 2021-01-29 MED ORDER — CARBOXYMETHYLCELLULOSE SODIUM 1 % OP SOLN: 1.0000 [drp] | Freq: Every day | OPHTHALMIC | Status: DC

## 2021-01-29 MED ORDER — SODIUM CHLORIDE 0.9 % IV SOLN
100.0000 mg | Freq: Once | INTRAVENOUS | Status: AC
  Administered 2021-01-29: 100 mg via INTRAVENOUS
  Filled 2021-01-29: qty 20

## 2021-01-29 MED ORDER — ALBUTEROL SULFATE HFA 108 (90 BASE) MCG/ACT IN AERS
2.0000 | INHALATION_SPRAY | RESPIRATORY_TRACT | Status: DC | PRN
Start: 2021-01-29 — End: 2021-01-31
  Filled 2021-01-29: qty 6.7

## 2021-01-29 MED ORDER — ENOXAPARIN SODIUM 40 MG/0.4ML IJ SOSY
40.0000 mg | PREFILLED_SYRINGE | INTRAMUSCULAR | Status: DC
  Administered 2021-01-29 – 2021-01-30 (×2): 40 mg via SUBCUTANEOUS
  Filled 2021-01-29: qty 0.4

## 2021-01-29 MED ORDER — LEVETIRACETAM 250 MG PO TABS
250.0000 mg | ORAL_TABLET | Freq: Every day | ORAL | Status: DC
  Administered 2021-01-29 – 2021-01-30 (×2): 250 mg via ORAL
  Filled 2021-01-29 (×3): qty 1

## 2021-01-29 MED ORDER — AZITHROMYCIN 500 MG PO TABS
250.0000 mg | ORAL_TABLET | Freq: Every day | ORAL | Status: DC
  Administered 2021-01-30 – 2021-01-31 (×2): 250 mg via ORAL
  Filled 2021-01-29 (×2): qty 1

## 2021-01-29 MED ORDER — VITAMIN E 180 MG (400 UNIT) PO CAPS
400.0000 [IU] | ORAL_CAPSULE | Freq: Every day | ORAL | Status: DC
  Administered 2021-01-30 – 2021-01-31 (×2): 400 [IU] via ORAL
  Filled 2021-01-29 (×3): qty 1

## 2021-01-29 MED ORDER — SODIUM CHLORIDE 0.9 % IV SOLN
200.0000 mg | Freq: Once | INTRAVENOUS | Status: DC
  Filled 2021-01-29: qty 40

## 2021-01-29 NOTE — ED Notes (Signed)
Per lab, unable to collect cultures at this time. Dr Blaine Hamper notified

## 2021-01-29 NOTE — ED Notes (Signed)
Dr Blaine Hamper notified of lactic 3.3. orders to be placed as needed

## 2021-01-29 NOTE — ED Triage Notes (Signed)
Pt spouse states the pt had a near syncopal event tonight, that he heard her get up, sounded like she was staggering, and when he got to her, she seemed responsive but disoriented lasting about 10-15 minutes. Pt is hard of hearing, has bilateral hearing aids. MAEx 4, denies pain.  Pt does have some wheezing-spouse says is new today.

## 2021-01-29 NOTE — ED Notes (Signed)
Pharmacy contacted to send remdesivir

## 2021-01-29 NOTE — ED Notes (Signed)
Pt assisted up to toilet at this time with walker and 2 staff members. Pt sat on toilet for several minutes without urinating. Pt assisted back to bed w/out providing sample

## 2021-01-29 NOTE — ED Provider Notes (Signed)
[facility] Emergency Department Provider Note   ____________________________________________   Event Date/Time   First MD Initiated Contact with Patient 01/29/21 [phone]     (approximate)  I have reviewed the triage vital signs and the nursing notes.   HISTORY  Chief Complaint Near syncope  Level V caveat: Limited by dementia, majority of history obtained via spouse  HPI [name] is a 80 y.o. female brought to the ED from home by her husband with a chief complaint of near syncope.  Husband states they have had family of holiday weekend.  Patient has seemed somewhat disoriented all weekend.  Husband heard patient get up to use the restroom, sounded like she was staggering and fell.  Patient was awake and responsive when he found her but seemed disoriented for 10 to 15 minutes.  Did not bite tongue or suffer urinary incontinence; has been does not think she had a seizure.  Denies fever, cough, chest pain, shortness of breath, abdominal pain, nausea, vomiting or dizziness.  Complains of mild right hip pain.     Past Medical History:  Diagnosis Date  . Arthritis   . 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 Breast cancer ([place]) 2018   left breast; surgery 01/11/15 with Dr. Bary Castilla; path with invasive mammary and DCIS  . Breast cancer of upper-inner quadrant of left female breast ([place]) 12/15/14   Completed radiation end of December and finished chemotherapy 2 weeks ago, Left breast invasive mammary carcinoma, T1cN51mc (1.5 cm); Grade 3, [name], [name], [name], .  .Marland KitchenCataract    bilat   . DDD (degenerative disc disease), lumbar    Lumbar, previously evaluated by Dr. HEarnestine Leys . Dementia ([facility]   . Fibrocystic breast disease    prior biopsy  . Glaucoma   . 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 Hearing loss   . History of cancer chemotherapy   . History of radiation therapy   . Hyperlipidemia,  unspecified   . Imbalance   . Memory impairment    seen by Dr SManuella Ghazi possible post crainiotomy from radiation  . Meningioma ([place]    Left cavernous sinus meningioma, [facility]  . Numbness and tingling    right hand   . Osteoarthritis   . Osteoporosis, post-menopausal   . Personal history of chemotherapy   . Personal history of radiation therapy   . Shoulder pain, right   . Wears glasses     Patient Active Problem List   Diagnosis Date Noted  . Closed anterior dislocation of humerus 10/18/2019  . Primary localized osteoarthritis of pelvic region and thigh 10/18/2019  . Shoulder pain 10/18/2019  . Spinal stenosis of lumbar region 10/18/2019  . Stiffness of shoulder joint 10/18/2019  . Hyperlipidemia 10/18/2019  . Sprain of ankle 09/06/2018  . Acute low back pain 10/01/2017  . Nonintractable epilepsy without status epilepticus ([place] 09/26/2017  . Encounter for monitoring cardiotoxic drug therapy 09/10/2017  . Closed trimalleolar fracture of right ankle 08/21/2017  . Ankle fracture, bimalleolar, closed, right, initial encounter 08/05/2017  . Closed right ankle fracture 08/05/2017  . Breast cancer metastasized to axillary lymph node, left ([place] 06/03/2017  . Gait instability 05/14/2017  . Goals of care, counseling/discussion 05/14/2017  . Cardiac syncope 01/06/2017  . Syncope 01/04/2017  . Bursitis of right hip 03/04/2016  . History of  total hip arthroplasty 03/04/2016  . Carcinoma of overlapping sites of left breast in female, estrogen receptor negative ([place]) 01/23/2016  . S/P right THA, AA 09/04/2015  . History of artificial joint 09/04/2015  . 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 Late onset Alzheimer's disease without behavioral disturbance ([place]) 08/30/2015  . Neoplasm of meninges 07/18/2015  . History of breast cancer 01/24/2015  . DDD (degenerative disc disease), lumbar 01/02/2015  . HLD  (hyperlipidemia) 01/02/2015  . Benign neoplasm of meninges ([place]) 01/02/2015  . Arthritis, degenerative 01/02/2015  . Psoriasis 01/02/2015  . Rotator cuff arthropathy 09/15/2012    Past Surgical History:  Procedure Laterality Date  . APPENDECTOMY  1950  . BRAIN SURGERY  1995   left frontal/temporal  . BREAST BIOPSY Left 12/15/14   confirmed DCIS  . BREAST BIOPSY Left 01/08/2015   Procedure: BREAST BIOPSY WITH NEEDLE LOCALIZATION;  Surgeon: [name], MD;  Location: [facility] ORS;  Service: General;  Laterality: Left;  . BREAST EXCISIONAL BIOPSY Left 1997  . BREAST LUMPECTOMY Left 01/08/2015   Procedure: LUMPECTOMY;  Surgeon: [name], MD;  Location: [facility] ORS;  Service: General;  Laterality: Left;  . COLONOSCOPY  2010   Dr. [name]  . ORIF ANKLE FRACTURE Right 08/05/2017   Procedure: OPEN REDUCTION INTERNAL FIXATION (ORIF) ANKLE FRACTURE;  Surgeon: Earnestine Leys, MD;  Location: [facility] ORS;  Service: Orthopedics;  Laterality: Right;  . PORTACATH PLACEMENT Right 01/16/2015   Procedure: INSERTION PORT-A-CATH;  Surgeon: [name], MD;  Location: [facility] ORS;  Service: General;  Laterality: Right;  . SENTINEL NODE BIOPSY Left 01/16/2015   Procedure: SENTINEL NODE BIOPSY;  Surgeon: [name], MD;  Location: [facility] ORS;  Service: General;  Laterality: Left;  . TOTAL HIP ARTHROPLASTY Right 09/04/2015   Procedure: RIGHT TOTAL HIP ARTHROPLASTY ANTERIOR APPROACH;  Surgeon: [name], MD;  Location: [facility] ORS;  Service: Orthopedics;  Laterality: Right;    Prior to Admission medications   Medication Sig Start Date End Date Taking? Authorizing Provider  albuterol (VENTOLIN HFA) 108 (90 Base) MCG/ACT inhaler Inhale 2 puffs into the lungs every 4 (four) hours as needed for wheezing or shortness of breath. 01/29/21  Yes Paulette Blanch, MD  nirmatrelvir/ritonavir EUA (PAXLOVID) TABS Take 3 tablets by mouth 2 (two) times daily for 5 days. Patient GFR is >60. Take nirmatrelvir (150 mg) two  tablets twice daily for 5 days and ritonavir (100 mg) one tablet twice daily for 5 days. 01/29/21 02/03/21 Yes Paulette Blanch, MD  acetaminophen (TYLENOL) 325 MG tablet Take 650 mg by mouth every 4 (four) hours as needed. Pain / increased temp.    [provider]  ARTIFICIAL TEARS 1 % ophthalmic solution Place 1 drop into both eyes daily. 08/18/19   [provider]  ascorbic acid (VITAMIN C) 500 MG tablet Take 1 tablet by mouth daily.    [provider]  Calcium Carbonate-Vitamin D (CALTRATE 600+D PO) Take 1 tablet by mouth daily.    [provider]  celecoxib (CELEBREX) 200 MG capsule Take 200 mg by mouth daily.     [provider]  COVID-19 mRNA Vac-TriS, Pfizer, (PFIZER-BIONT COVID-19 VAC-TRIS) SUSP injection Inject into the muscle. 12/07/20   Carlyle Basques, MD  levETIRAcetam (KEPPRA) 250 MG tablet 1 tablet daily at bedtime 09/15/17   [provider]  traMADol (ULTRAM) 50 MG tablet Take 1 tablet (50 mg total) by mouth 2 (two) times daily as needed for moderate pain. 02/10/18  Cammie Sickle, MD  Travoprost, BAK Free, (TRAVATAN) 0.004 % SOLN ophthalmic solution travoprost 0.004 % eye drops    [provider]  vitamin E 400 UNIT capsule Take 400 Units by mouth daily.     [provider]    Allergies Donepezil  Family History  Problem Relation Age of Onset  . Breast cancer Sister 17  . Addison's disease Mother   . Hyperthyroidism Mother   . Osteoarthritis Mother   . Stroke Father   . Heart disease Father   . High blood pressure Father     Social History Social History   Tobacco Use  . Smoking status: Former    Packs/day: 0.25    Years: 5.00    Pack years: 1.25    Types: Cigarettes    Quit date: 07/28/1972    Years since quitting: 48.5  . Smokeless tobacco: Never  Vaping Use  . Vaping Use: Never used  Substance Use Topics  . Alcohol use: Yes    Alcohol/week: 1.0 - 2.0 standard drink    Types: 1 - 2  Glasses of wine per week    Comment: 1 Glass Wine / Night  . Drug use: No    Review of Systems  Constitutional: No fever/chills Eyes: No visual changes. ENT: No sore throat. Cardiovascular: Denies chest pain. Respiratory: Denies shortness of breath. Gastrointestinal: No abdominal pain.  No nausea, no vomiting.  No diarrhea.  No constipation. Genitourinary: Negative for dysuria. Musculoskeletal: Positive for right hip pain.  Negative for back pain. Skin: Negative for rash. Neurological: Positive for disorientation.  Negative for headaches, focal weakness or numbness.   ____________________________________________   PHYSICAL EXAM:  VITAL SIGNS: ED Triage Vitals  Enc Vitals Group     BP 01/29/21 0227 (!) 111/52     Pulse Rate 01/29/21 0227 87     Resp 01/29/21 0227 18     Temp 01/29/21 0227 98.1 F (36.7 C)     Temp Source 01/29/21 0227 Oral     SpO2 01/29/21 0227 93 %     Weight 01/29/21 0229 161 lb (73 kg)     Height 01/29/21 0229 5' 4"  (1.626 m)     Head Circumference --      Peak Flow --      Pain Score 01/29/21 0237 0     Pain Loc --      Pain Edu? --      Excl. in [place]? --     Constitutional: Alert and oriented.  Elderly appearing and in no acute distress.  Very hard of hearing. Eyes: Conjunctivae are normal. PERRL. EOMI. Head: Atraumatic. Nose: No congestion/rhinnorhea. Mouth/Throat: Mucous membranes are mildly dry. Neck: No stridor.  No cervical spine tenderness to palpation.  No carotid bruits. Cardiovascular: Normal rate, regular rhythm. Grossly normal heart sounds.  Good peripheral circulation. Respiratory: Normal respiratory effort.  No retractions. Lungs CTAB. Gastrointestinal: Soft and nontender to light or deep palpation. No distention. No abdominal bruits. No CVA tenderness. Musculoskeletal: No lower extremity tenderness nor edema.  No joint effusions. Neurologic: Alert and oriented to person and place.  Normal speech and language. No gross focal  neurologic deficits are appreciated. MAEx4. Skin:  Skin is warm, dry and intact. No rash noted. Psychiatric: Mood and affect are normal. Speech and behavior are normal.  ____________________________________________   LABS (all labs ordered are listed, but only abnormal results are displayed)  Labs Reviewed  RESP PANEL BY RT-PCR (FLU A&B, COVID) ARPGX2 - Abnormal; Notable  for the following components:      Result Value   SARS Coronavirus 2 by RT PCR POSITIVE (*)    All other components within normal limits  CBC WITH DIFFERENTIAL/PLATELET - Abnormal; Notable for the following components:   WBC 13.2 (*)    Neutro Abs 11.6 (*)    All other components within normal limits  COMPREHENSIVE METABOLIC PANEL - Abnormal; Notable for the following components:   Glucose, Bld 134 (*)    All other components within normal limits  URINALYSIS, COMPLETE (UACMP) WITH MICROSCOPIC - Abnormal; Notable for the following components:   Color, Urine YELLOW (*)    APPearance HAZY (*)    Hgb urine dipstick SMALL (*)    Bacteria, UA RARE (*)    All other components within normal limits  CBG MONITORING, ED - Abnormal; Notable for the following components:   Glucose-Capillary 132 (*)    All other components within normal limits  BRAIN NATRIURETIC PEPTIDE  D-DIMER, QUANTITATIVE  C-REACTIVE PROTEIN  FERRITIN  FIBRINOGEN  HEPATITIS B SURFACE ANTIGEN  LACTATE DEHYDROGENASE  PROCALCITONIN  ABO/RH  TYPE AND SCREEN  TROPONIN I (HIGH SENSITIVITY)  TROPONIN I (HIGH SENSITIVITY)   ____________________________________________  EKG  ED ECG REPORT I, Rhapsody Wolven J, the attending physician, personally viewed and interpreted this ECG.   Date: 01/29/2021  EKG Time: 0233  Rate: 84  Rhythm: normal EKG, normal sinus rhythm  Axis: Normal  Intervals:none  ST&T Change: Nonspecific  ____________________________________________  RADIOLOGY I, Kilian Schwartz J, personally viewed and evaluated these images (plain  radiographs) as part of my medical decision making, as well as reviewing the written report by the radiologist.  ED MD interpretation: No acute cardiopulmonary process; no ICH, right hip unremarkable  Official radiology report(s): DG Chest 2 View  Result Date: 01/29/2021 CLINICAL DATA:  New cough and wheezing. Near syncopal event tonight. EXAM: CHEST - 2 VIEW COMPARISON:  CT chest 11/29/2020 FINDINGS: Central venous catheter with tip over the cavoatrial junction region. No pneumothorax. Shallow inspiration. Heart size and pulmonary vascularity appear normal. Lungs are clear. No pleural effusions. No pneumothorax. Mediastinal contours appear intact. Calcification of the aorta. IMPRESSION: No active cardiopulmonary disease. Electronically Signed   By: Lucienne Capers M.D.   On: 01/29/2021 03:26   CT Head Wo Contrast  Result Date: 01/29/2021 CLINICAL DATA:  Near syncopal event.  Encephalopathy. EXAM: CT HEAD WITHOUT CONTRAST TECHNIQUE: Contiguous axial images were obtained from the base of the skull through the vertex without intravenous contrast. COMPARISON:  01/04/2017 FINDINGS: Brain: There is no mass, hemorrhage or extra-axial collection. There is generalized atrophy without lobar predilection. Hypodensity of the white matter is most commonly associated with chronic microvascular disease. Vascular: Atherosclerotic calcification of the internal carotid arteries at the skull base. No abnormal hyperdensity of the major intracranial arteries or dural venous sinuses. Skull: Remote left craniotomy Sinuses/Orbits: Sphenoid sinus mucosal thickening. The orbits are normal. IMPRESSION: Generalized atrophy and chronic microvascular ischemia without acute intracranial abnormality. Electronically Signed   By: Ulyses Jarred M.D.   On: 01/29/2021 03:45   DG Hip Unilat With Pelvis 2-3 Views Right  Result Date: 01/29/2021 CLINICAL DATA:  Right hip pain after a fall.  Near syncopal event. EXAM: DG HIP (WITH OR WITHOUT  PELVIS) 2-3V RIGHT COMPARISON:  CT abdomen and pelvis 11/29/2020 FINDINGS: Right total hip arthroplasty with non cemented components. Components appear well seated. No evidence of acute fracture or dislocation. Pelvis and visualized left hip appear intact. Degenerative changes in the lower lumbar spine  and left hip. Calcifications in the pelvis likely represent calcified uterine fibroids. IMPRESSION: Right total hip arthroplasty appears well seated. No evidence of acute fracture or dislocation. Electronically Signed   By: Lucienne Capers M.D.   On: 01/29/2021 03:27    ____________________________________________   PROCEDURES  Procedure(s) performed (including Critical Care):  .1-3 Lead EKG Interpretation  Date/Time: 01/29/2021 3:09 AM Performed by: Paulette Blanch, MD Authorized by: Paulette Blanch, MD     Interpretation: normal     ECG rate:  84   ECG rate assessment: normal     Rhythm: sinus rhythm     Ectopy: none     Conduction: normal   Comments:     Patient placed on cardiac monitor to evaluate for arrhythmias   ____________________________________________   INITIAL IMPRESSION / ASSESSMENT AND PLAN / ED COURSE  As part of my medical decision making, I reviewed the following data within the electronic MEDICAL RECORD NUMBER History obtained from family, Nursing notes reviewed and incorporated, Labs reviewed, EKG interpreted, Old chart reviewed, Radiograph reviewed, and Notes from prior ED visits     80 year old female presenting with near syncope and altered mentation. Differential diagnosis includes, but is not limited to, alcohol, illicit or prescription medications, or other toxic ingestion; intracranial pathology such as stroke or intracerebral hemorrhage; fever or infectious causes including sepsis; hypoxemia and/or hypercarbia; uremia; trauma; endocrine related disorders such as diabetes, hypoglycemia, and thyroid-related diseases; hypertensive encephalopathy; etc.   Will obtain  cardiac work-up, CT head, image chest and right hip.  Perform orthostatic vital signs, initiate IV hydration and reassess.  Clinical Course as of 01/29/21 0610  Tue Jan 29, 2021  0406 Updated spouse of test results thus far.  Awaiting urine specimen, repeat troponin and respiratory panel.  Patient resting in no acute distress. [JS]  3338 No orthostasis. [JS]  3291 COVID+. Awaiting urine result. [JS]  [id number] Updated patient and spouse on all test results.  Spouse would like for patient to be discharged home.  Room air saturations currently 100%.  Will discharge home with prescription for Albuterol inhaler and Paxlovid.  Strict return precautions given.  Spouse verbalizes understanding agrees with plan of care. [JS]  0604 Patient was very unsteady on her feet when nurses were getting her ready for discharge.  Took 2 person assist to get her up due to generalized weakness.  Discussed with spouse unsafe discharge; will discuss with hospitalist services for admission. [JS]    Clinical Course User Index [JS] Paulette Blanch, MD     ____________________________________________   FINAL CLINICAL IMPRESSION(S) / ED DIAGNOSES  Final diagnoses:  COVID-19  Weakness generalized     ED Discharge Orders          Ordered    albuterol (VENTOLIN HFA) 108 (90 Base) MCG/ACT inhaler  Every 4 hours PRN        01/29/21 0550    nirmatrelvir/ritonavir EUA (PAXLOVID) TABS  2 times daily        01/29/21 0550             Note:  This document was prepared using Dragon voice recognition software and may include unintentional dictation errors.    Paulette Blanch, MD 01/29/21 [id number]

## 2021-01-29 NOTE — ED Notes (Signed)
Pt placed on purewick, provided warm blanket, stretcher locked in lowest position, call bell in reach. Alert and oriented

## 2021-01-29 NOTE — ED Notes (Signed)
Lab contacted to collect cultures

## 2021-01-29 NOTE — ED Notes (Signed)
Pt provided breakfast tray and assisted with set up

## 2021-01-29 NOTE — Progress Notes (Signed)
Remdesivir - Pharmacy Brief Note   O:  ALT: 22 CXR:  SpO2: 95  % on  RA   A/P:  Remdesivir 200 mg IVPB once followed by 100 mg IVPB daily x 4 days.   Loyce Flaming D 01/29/2021 6:19 AM

## 2021-01-29 NOTE — Discharge Instructions (Signed)
You may start Paxilovid within 10 days of symptom onset.  You may use Albuterol inhaler  2 puffs every 4 hours as needed for cough/wheezing/difficulty breathing.  Return to the ER for worsening symptoms, persistent vomiting, difficulty breathing, profound weakness, or other concerns.

## 2021-01-29 NOTE — H&P (Signed)
History and Physical    [name]:[id number] DOB: 1941/03/14 DOA: 01/29/2021  Referring MD/NP/PA:   PCP: [name], MD   Patient coming from:  The patient is coming from home.  Chief Complaint: Generalized weakness, confusion, cough and SOB.  HPI: [name] is a 80 y.o. 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  Per her few husband (I called her husband by phone), in the past several days, patient has been having generalized weakness, cough and shortness of breath.  Does not seem to have chest pain.  No fever or chills.  No nausea, vomiting, diarrhea or abdominal pain.  Husband states that patient was confused and staggering last night when she went to the bathroom. She did not fall, no injury. Pt has chronic right hip pain.  Per her husband, at her normal baseline, patient most of the time is alert orientated x3.  When I saw patient in ED, patient is confused, with hard of hearing, seem to be not orientated x3.  She moves all extremities.  No facial droop or slurred speech.  Husband states that patient still taking Keppra for seizure prophylaxis.  ED Course: pt was found to have positive COVID-19 PCR, WBC 13.2, troponin level 3, BNP 8.2, negative urinalysis, electrolytes renal function okay, temperature normal, blood pressure 121/46, heart rate 88, RR 25, oxygen saturation 90, 95, 100% on room air, chest x-ray negative for infiltration.  CT of head is negative for acute intracranial abnormalities.  Right hip x-ray is negative for bony fracture.  Patient is admitted to [facility] bed by accepting MD.  Review of Systems: Not reviewed accurately due to altered mental  status and hx of dementia.   Allergy:  Allergies  Allergen Reactions   Donepezil Other (See Comments)    Other reaction(s): Other (See Comments) Nightmares    Past Medical History:  Diagnosis Date   Arthritis    Brain tumor ([place]) 1995   meningeoma   Breast cancer ([place]) 2018   left breast; surgery 01/11/15 with Dr. Bary Castilla; path with invasive mammary and DCIS   Breast cancer of upper-inner quadrant of left female breast ([place]) 12/15/14   Completed radiation end of December and finished chemotherapy 2 weeks ago, Left breast invasive mammary carcinoma, T1cN10mc (1.5 cm); Grade 3, [name], [name], [name], .   Cataract    bilat    DDD (degenerative disc disease), lumbar    Lumbar, previously evaluated by Dr. HEarnestine Leys  Dementia ([facility]    Fibrocystic breast disease    prior biopsy   Glaucoma    Hard of hearing    wears hearing aides bilat    Hearing loss    History of cancer chemotherapy    History of radiation therapy    Hyperlipidemia, unspecified    Imbalance    Memory impairment    seen by Dr SManuella Ghazi possible post crainiotomy from radiation   Meningioma ([facility]    Left cavernous sinus meningioma, [facility].   Numbness and tingling    right hand    Osteoarthritis    Osteoporosis, post-menopausal    Personal history of chemotherapy    Personal  history of radiation therapy    Shoulder pain, right    Wears glasses     Past Surgical History:  Procedure Laterality Date   [place]   left frontal/temporal   BREAST BIOPSY Left 12/15/14   confirmed DCIS   BREAST BIOPSY Left 01/08/2015   Procedure: BREAST BIOPSY WITH NEEDLE LOCALIZATION;  Surgeon: [name], MD;  Location: [facility] ORS;  Service: General;  Laterality: Left;   BREAST EXCISIONAL BIOPSY Left 1997   BREAST LUMPECTOMY Left 01/08/2015   Procedure: LUMPECTOMY;  Surgeon: [name], MD;   Location: [facility] ORS;  Service: General;  Laterality: Left;   COLONOSCOPY  2010   Dr. [name]   ORIF ANKLE FRACTURE Right 08/05/2017   Procedure: OPEN REDUCTION INTERNAL FIXATION (ORIF) ANKLE FRACTURE;  Surgeon: Earnestine Leys, MD;  Location: [facility] ORS;  Service: Orthopedics;  Laterality: Right;   PORTACATH PLACEMENT Right 01/16/2015   Procedure: INSERTION PORT-A-CATH;  Surgeon: [name], MD;  Location: [facility] ORS;  Service: General;  Laterality: Right;   SENTINEL NODE BIOPSY Left 01/16/2015   Procedure: SENTINEL NODE BIOPSY;  Surgeon: [name], MD;  Location: [facility] ORS;  Service: General;  Laterality: Left;   TOTAL HIP ARTHROPLASTY Right 09/04/2015   Procedure: RIGHT TOTAL HIP ARTHROPLASTY ANTERIOR APPROACH;  Surgeon: [name], MD;  Location: [facility] ORS;  Service: Orthopedics;  Laterality: Right;    Social History:  reports that she quit smoking about 48 years ago. Her smoking use included cigarettes. She has a 1.25 pack-year smoking history. She has never used smokeless tobacco. She reports current alcohol use of about 1.0 - 2.0 standard drink of alcohol per week. She reports that she does not use drugs.  Family History:  Family History  Problem Relation Age of Onset   Breast cancer Sister 9   Addison's disease Mother    Hyperthyroidism Mother    Osteoarthritis Mother    Stroke Father    Heart disease Father    High blood pressure Father      Prior to Admission medications   Medication Sig Start Date End Date Taking? Authorizing Provider  albuterol (VENTOLIN HFA) 108 (90 Base) MCG/ACT inhaler Inhale 2 puffs into the lungs every 4 (four) hours as needed for wheezing or shortness of breath. 01/29/21  Yes Paulette Blanch, MD  nirmatrelvir/ritonavir EUA (PAXLOVID) TABS Take 3 tablets by mouth 2 (two) times daily for 5 days. Patient GFR is >60. Take nirmatrelvir (150 mg) two tablets twice daily for 5 days and ritonavir (100 mg) one tablet twice daily for 5 days. 01/29/21 02/03/21 Yes Paulette Blanch, MD  acetaminophen (TYLENOL) 325 MG tablet Take 650 mg by mouth every 4 (four) hours as needed. Pain / increased temp.    [provider]  ARTIFICIAL TEARS 1 % ophthalmic solution Place 1 drop into both eyes daily. 08/18/19   [provider]  ascorbic acid (VITAMIN C) 500 MG tablet Take 1 tablet by mouth daily.    [provider]  Calcium Carbonate-Vitamin D (CALTRATE 600+D PO) Take 1 tablet by mouth daily.    [provider]  celecoxib (CELEBREX) 200 MG capsule Take 200 mg by mouth daily.     [provider]  COVID-19 mRNA Vac-TriS, Pfizer, (PFIZER-BIONT COVID-19 VAC-TRIS) SUSP injection Inject into the muscle. 12/07/20   Carlyle Basques, MD  levETIRAcetam (KEPPRA) 250 MG tablet 1 tablet daily at bedtime 09/15/17   [provider]  traMADol (  ULTRAM) 50 MG tablet Take 1 tablet (50 mg total) by mouth 2 (two) times daily as needed for moderate pain. 02/10/18   Cammie Sickle, MD  Travoprost, BAK Free, (TRAVATAN) 0.004 % SOLN ophthalmic solution travoprost 0.004 % eye drops    [provider]  vitamin E 400 UNIT capsule Take 400 Units by mouth daily.     [provider]    Physical Exam: Vitals:   01/29/21 0745 01/29/21 0830 01/29/21 0915 01/29/21 0953  BP: (!) 113/46 (!) 110/47 (!) 119/45   Pulse: 90 87 86 90  Resp: (!) 25 (!) 22 (!) 24   Temp:      TempSrc:      SpO2: 97% 96% 97% 95%  Weight:      Height:       General: Not in acute distress HEENT:       Eyes: PERRL, EOMI, no scleral icterus.       ENT: No discharge from the ears and nose.       Neck: No JVD, no bruit, no mass felt. Heme: No neck lymph node enlargement. Cardiac: S1/S2, RRR, No murmurs, No gallops or rubs. Respiratory: has mild wheezing bilaterally GI: Soft, nondistended, nontender, no organomegaly, BS present. GU: No hematuria Ext: has trace leg edema bilaterally. 1+DP/PT pulse bilaterally. Musculoskeletal: No joint deformities, No  joint redness or warmth, no limitation of ROM in spin. Skin: No rashes.  Neuro: Confused, not oriented X3, cranial nerves II-XII grossly intact, moves all extremities Psych: Patient is not psychotic, no suicidal or hemocidal ideation.  Labs on Admission: I have personally reviewed following labs and imaging studies  CBC: Recent Labs  Lab 01/23/21 0814 01/29/21 0239  WBC 6.8 13.2*  NEUTROABS 5.0 11.6*  HGB 13.8 14.3  HCT 43.3 45.2  MCV 93.5 94.4  PLT 262 300   Basic Metabolic Panel: Recent Labs  Lab 01/23/21 0814 01/29/21 0239  NA 137 137  K 3.6 3.7  CL 102 99  CO2 27 29  GLUCOSE 205* 134*  BUN 15 18  CREATININE 0.94 0.83  CALCIUM 8.6* 8.9   GFR: Estimated Creatinine Clearance: 54.7 mL/min (by C-G formula based on SCr of 0.83 mg/dL). Liver Function Tests: Recent Labs  Lab 01/23/21 0814 01/29/21 0239  AST 28 22  ALT 19 22  ALKPHOS 55 56  BILITOT 0.6 0.7  PROT 6.9 7.3  ALBUMIN 3.8 4.1   No results for input(s): LIPASE, AMYLASE in the last 168 hours. No results for input(s): AMMONIA in the last 168 hours. Coagulation Profile: No results for input(s): INR, PROTIME in the last 168 hours. Cardiac Enzymes: No results for input(s): CKTOTAL, CKMB, CKMBINDEX, TROPONINI in the last 168 hours. BNP (last 3 results) No results for input(s): PROBNP in the last 8760 hours. HbA1C: No results for input(s): HGBA1C in the last 72 hours. CBG: Recent Labs  Lab 01/29/21 0240  GLUCAP 132*   Lipid Profile: No results for input(s): CHOL, HDL, LDLCALC, TRIG, CHOLHDL, LDLDIRECT in the last 72 hours. Thyroid Function Tests: No results for input(s): TSH, T4TOTAL, FREET4, T3FREE, THYROIDAB in the last 72 hours. Anemia Panel: Recent Labs    01/29/21 0626  FERRITIN 92   Urine analysis:    Component Value Date/Time   COLORURINE YELLOW (A) 01/29/2021 0512   APPEARANCEUR HAZY (A) 01/29/2021 0512   LABSPEC 1.015 01/29/2021 0512   PHURINE 5.0 01/29/2021 0512   GLUCOSEU  NEGATIVE 01/29/2021 0512   HGBUR SMALL (A) 01/29/2021 0512   BILIRUBINUR  NEGATIVE 01/29/2021 [place] 01/29/2021 0512   PROTEINUR NEGATIVE 01/29/2021 0512   NITRITE NEGATIVE 01/29/2021 0512   LEUKOCYTESUR NEGATIVE 01/29/2021 0512   Sepsis Labs: @LABRCNTIP (procalcitonin:4,lacticidven:4) ) Recent Results (from the past 240 hour(s))  Resp Panel by RT-PCR (Flu A&B, Covid) Nasopharyngeal Swab     Status: Abnormal   Collection Time: 01/29/21  4:06 AM   Specimen: Nasopharyngeal Swab; Nasopharyngeal(NP) swabs in vial transport medium  Result Value Ref Range Status   SARS Coronavirus 2 by RT PCR POSITIVE (A) NEGATIVE Final    Comment: RESULT CALLED TO, READ BACK BY AND VERIFIED WITH: LORRIE LEMONS@0531  01/29/21 [facility] (NOTE) SARS-CoV-2 target nucleic acids are DETECTED.  The SARS-CoV-2 RNA is generally detectable in upper respiratory specimens during the acute phase of infection. Positive results are indicative of the presence of the identified virus, but do not rule out bacterial infection or co-infection with other pathogens not detected by the test. Clinical correlation with patient history and other diagnostic information is necessary to determine patient infection status. The expected result is Negative.  Fact Sheet for Patients: EntrepreneurPulse.com.au  Fact Sheet for Healthcare Providers: IncredibleEmployment.be  This test is not yet approved or cleared by the [place] FDA and  has been authorized for detection and/or diagnosis of SARS-CoV-2 by FDA under an Emergency Use Authorization (EUA).  This EUA will remain in effect (meaning this test can be used ) for the duration of  the COVID-19 declaration under Section 564(b)(1) of the Act, 21 U.S.C. section 360bbb-3(b)(1), unless the authorization is terminated or revoked sooner.     Influenza A by PCR NEGATIVE NEGATIVE Final   Influenza B by PCR NEGATIVE NEGATIVE Final     Comment: (NOTE) The Xpert Xpress SARS-CoV-2/FLU/RSV plus assay is intended as an aid in the diagnosis of influenza from Nasopharyngeal swab specimens and should not be used as a sole basis for treatment. Nasal washings and aspirates are unacceptable for Xpert Xpress SARS-CoV-2/FLU/RSV testing.  Fact Sheet for Patients: EntrepreneurPulse.com.au  Fact Sheet for Healthcare Providers: IncredibleEmployment.be  This test is not yet approved or cleared by the [place] FDA and has been authorized for detection and/or diagnosis of SARS-CoV-2 by FDA under an Emergency Use Authorization (EUA). This EUA will remain in effect (meaning this test can be used) for the duration of the COVID-19 declaration under Section 564(b)(1) of the Act, 21 U.S.C. section 360bbb-3(b)(1), unless the authorization is terminated or revoked.  Performed at [facility], [address] [place], [place] [address]      Radiological Exams on Admission: DG Chest 2 View  Result Date: 01/29/2021 CLINICAL DATA:  New cough and wheezing. Near syncopal event tonight. EXAM: CHEST - 2 VIEW COMPARISON:  CT chest 11/29/2020 FINDINGS: Central venous catheter with tip over the cavoatrial junction region. No pneumothorax. Shallow inspiration. Heart size and pulmonary vascularity appear normal. Lungs are clear. No pleural effusions. No pneumothorax. Mediastinal contours appear intact. Calcification of the aorta. IMPRESSION: No active cardiopulmonary disease. Electronically Signed   By: Lucienne Capers M.D.   On: 01/29/2021 03:26   CT Head Wo Contrast  Result Date: 01/29/2021 CLINICAL DATA:  Near syncopal event.  Encephalopathy. EXAM: CT HEAD WITHOUT CONTRAST TECHNIQUE: Contiguous axial images were obtained from the base of the skull through the vertex without intravenous contrast. COMPARISON:  01/04/2017 FINDINGS: Brain: There is no mass, hemorrhage or extra-axial collection. There is  generalized atrophy without lobar predilection. Hypodensity of the white matter is most commonly associated with chronic microvascular  disease. Vascular: Atherosclerotic calcification of the internal carotid arteries at the skull base. No abnormal hyperdensity of the major intracranial arteries or dural venous sinuses. Skull: Remote left craniotomy Sinuses/Orbits: Sphenoid sinus mucosal thickening. The orbits are normal. IMPRESSION: Generalized atrophy and chronic microvascular ischemia without acute intracranial abnormality. Electronically Signed   By: Ulyses Jarred M.D.   On: 01/29/2021 03:45   DG Hip Unilat With Pelvis 2-3 Views Right  Result Date: 01/29/2021 CLINICAL DATA:  Right hip pain after a fall.  Near syncopal event. EXAM: DG HIP (WITH OR WITHOUT PELVIS) 2-3V RIGHT COMPARISON:  CT abdomen and pelvis 11/29/2020 FINDINGS: Right total hip arthroplasty with non cemented components. Components appear well seated. No evidence of acute fracture or dislocation. Pelvis and visualized left hip appear intact. Degenerative changes in the lower lumbar spine and left hip. Calcifications in the pelvis likely represent calcified uterine fibroids. IMPRESSION: Right total hip arthroplasty appears well seated. No evidence of acute fracture or dislocation. Electronically Signed   By: Lucienne Capers M.D.   On: 01/29/2021 03:27     EKG: I have personally reviewed.  Sinus rhythm, QTC 427, early R wave progression, nonspecific T wave change.  Assessment/Plan Principal Problem:   Acute respiratory disease due to COVID-19 virus Active Problems:   DDD (degenerative disc disease), lumbar   HLD (hyperlipidemia)   Benign neoplasm of meninges (HCC)   Acute metabolic encephalopathy   Severe sepsis (HCC)  Severe sepsis due to acute respiratory disease due to COVID-19 virus: Oxygen saturation 90-100% on room air.  Chest x-ray negative for infiltration.  Patient has wheezing on auscultation indicating bronchospasm.   Patient meets criteria for severe sepsis with leukocytosis WBC 13.2 and tachypnea with RR 25.  Lactic acid is elevated 3.3.  -will admit to med-surg bed as inpt -Remdesivir per pharm -Solumedrol 40 mg bid -Bronchodilators - Z pak due to leukocytosis. -PRN Mucinex for cough -f/u Blood culture -Follow-up inflammatory markers -will get Procalcitonin and trend lactic acid levels per sepsis protocol. -IVF: total of 2.0 of NS bolus, followed by 75 cc/h   HLD (hyperlipidemia): Patient's not taking medications currently -f/u with PCP  Benign neoplasm of meninges (HCC) -Continue home Keppra prophylaxis of seizure  Acute metabolic encephalopathy: Likely due to ongoing infection and severe sepsis.  CT head negative for acute intracranial abnormalities. -Frequent neuro check    DVT ppx: SQ Lovenox Code Status: Full code per her husband Family Communication:  Yes, patient's husband by phone Disposition Plan:  Anticipate discharge back to previous environment Consults called:  none Admission status and Level of care: Med-Surg:   as inpt       Status is: Inpatient  Remains inpatient appropriate because:Inpatient level of care appropriate due to severity of illness  Dispo: The patient is from: Home              Anticipated d/c is to: Home              Patient currently is not medically stable to d/c.   Difficult to place patient No          Date of Service 01/29/2021    [facility]   If 7PM-7AM, please contact night-coverage [email or web address] 01/29/2021, 10:25 AM

## 2021-01-29 NOTE — ED Notes (Signed)
Pt spilled coffee on shirt, changed to gown and shirt placed in belonging bag

## 2021-01-30 ENCOUNTER — Inpatient Hospital Stay: Payer: Medicare Other

## 2021-01-30 ENCOUNTER — Encounter: Payer: Self-pay | Admitting: Internal Medicine

## 2021-01-30 DIAGNOSIS — Z515 Encounter for palliative care: Secondary | ICD-10-CM

## 2021-01-30 DIAGNOSIS — U071 COVID-19: Secondary | ICD-10-CM

## 2021-01-30 DIAGNOSIS — J069 Acute upper respiratory infection, unspecified: Secondary | ICD-10-CM

## 2021-01-30 LAB — CBC WITH DIFFERENTIAL/PLATELET
Abs Immature Granulocytes: 0.09 10*3/uL — ABNORMAL HIGH (ref 0.00–0.07)
Basophils Absolute: 0 10*3/uL (ref 0.0–0.1)
Basophils Relative: 0 %
Eosinophils Absolute: 0 10*3/uL (ref 0.0–0.5)
Eosinophils Relative: 0 %
HCT: 39.7 % (ref 36.0–46.0)
Hemoglobin: 13.1 g/dL (ref 12.0–15.0)
Immature Granulocytes: 1 %
Lymphocytes Relative: 7 %
Lymphs Abs: 1.2 10*3/uL (ref 0.7–4.0)
MCH: 30.7 pg (ref 26.0–34.0)
MCHC: 33 g/dL (ref 30.0–36.0)
MCV: 93 fL (ref 80.0–100.0)
Monocytes Absolute: 0.9 10*3/uL (ref 0.1–1.0)
Monocytes Relative: 5 %
Neutro Abs: 14.9 10*3/uL — ABNORMAL HIGH (ref 1.7–7.7)
Neutrophils Relative %: 87 %
Platelets: 241 10*3/uL (ref 150–400)
RBC: 4.27 MIL/uL (ref 3.87–5.11)
RDW: 14 % (ref 11.5–15.5)
WBC: 17.1 10*3/uL — ABNORMAL HIGH (ref 4.0–10.5)
nRBC: 0.1 % (ref 0.0–0.2)

## 2021-01-30 LAB — COMPREHENSIVE METABOLIC PANEL
ALT: 14 U/L (ref 0–44)
AST: 20 U/L (ref 15–41)
Albumin: 2.8 g/dL — ABNORMAL LOW (ref 3.5–5.0)
Alkaline Phosphatase: 39 U/L (ref 38–126)
Anion gap: 6 (ref 5–15)
BUN: 16 mg/dL (ref 8–23)
CO2: 22 mmol/L (ref 22–32)
Calcium: 7.4 mg/dL — ABNORMAL LOW (ref 8.9–10.3)
Chloride: 111 mmol/L (ref 98–111)
Creatinine, Ser: 0.65 mg/dL (ref 0.44–1.00)
GFR, Estimated: >60 mL/min (ref >60)
Glucose, Bld: 129 mg/dL — ABNORMAL HIGH (ref 70–99)
Potassium: 3.7 mmol/L (ref 3.5–5.1)
Sodium: 139 mmol/L (ref 135–145)
Total Bilirubin: 0.3 mg/dL (ref 0.3–1.2)
Total Protein: 5.4 g/dL — ABNORMAL LOW (ref 6.5–8.1)

## 2021-01-30 LAB — IRON AND TIBC
Iron: 51 ug/dL (ref 28–170)
Saturation Ratios: 21 % (ref 10.4–31.8)
TIBC: 241 ug/dL — ABNORMAL LOW (ref 250–450)
UIBC: 190 ug/dL

## 2021-01-30 LAB — D-DIMER, QUANTITATIVE: D-Dimer, Quant: 1.75 ug/mL-FEU — ABNORMAL HIGH (ref 0.00–0.50)

## 2021-01-30 LAB — FOLATE: Folate: 17.6 ng/mL (ref >5.9)

## 2021-01-30 LAB — VITAMIN D 25 HYDROXY (VIT D DEFICIENCY, FRACTURES): Vit D, 25-Hydroxy: 30.45 ng/mL (ref 30–100)

## 2021-01-30 LAB — LACTIC ACID, PLASMA
Lactic Acid, Venous: 2.6 mmol/L (ref 0.5–1.9)
Lactic Acid, Venous: 3 mmol/L (ref 0.5–1.9)
Lactic Acid, Venous: 1.8 mmol/L (ref 0.5–1.9)

## 2021-01-30 LAB — VITAMIN B12: Vitamin B-12: 645 pg/mL (ref 180–914)

## 2021-01-30 LAB — C-REACTIVE PROTEIN: CRP: 6.5 mg/dL — ABNORMAL HIGH (ref <1.0)

## 2021-01-30 MED ORDER — IOHEXOL 350 MG/ML SOLN
75.0000 mL | Freq: Once | INTRAVENOUS | Status: AC | PRN
  Administered 2021-01-30: 75 mL via INTRAVENOUS

## 2021-01-30 MED ORDER — VITAMIN D (ERGOCALCIFEROL) 1.25 MG (50000 UNIT) PO CAPS
50000.0000 [IU] | ORAL_CAPSULE | ORAL | Status: DC
  Administered 2021-01-30: 22:00:00 50000 [IU] via ORAL
  Filled 2021-01-30: qty 1

## 2021-01-30 NOTE — Progress Notes (Signed)
   01/29/21 2056  Provider Notification  Provider Name/Title Mansy MD  Date Provider Notified 01/29/21  Time Provider Notified 2116  Notification Type Page  Notification Reason Critical result  Test performed and critical result lactic acid 2.9  Date Critical Result Received 01/29/21  Time Critical Result Received 2056  Provider response See new orders  Date of Provider Response 01/29/21  Time of Provider Response 2116

## 2021-01-30 NOTE — Progress Notes (Signed)
[facility] Progress Note  Patient: [name]    QZE:[id number]  DOA: 01/29/2021     Date of Service: the patient was seen and examined on 01/30/2021  No chief complaint on file.  Brief hospital course: [name] is a 80 y.o. 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  ED Course: pt was found to have positive COVID-19 PCR, WBC 13.2, troponin level 3, BNP 8.2, negative urinalysis, electrolytes renal function okay, temperature normal, blood pressure 121/46, heart rate 88, RR 25, oxygen saturation 90, 95, 100% on room air, chest x-ray negative for infiltration.  CT of head is negative for acute intracranial abnormalities.  Right hip x-ray is negative for bony fracture.  Patient is admitted to [facility] bed by accepting MD.  Assessment and Plan: Principal Problem:   Acute respiratory disease due to COVID-19 virus Active Problems:   DDD (degenerative disc disease), lumbar   HLD (hyperlipidemia)   Benign neoplasm of meninges (HCC)   Acute metabolic encephalopathy   Severe sepsis (HCC)   Severe sepsis due to acute respiratory disease due to COVID-19 virus: Oxygen saturation 90-100% on room air.  Chest x-ray negative for infiltration.  Patient has wheezing on auscultation indicating bronchospasm.  Patient meets criteria for severe sepsis with leukocytosis WBC 13.2 and tachypnea with RR 25.  Lactic acid is elevated 3.3.  Remdesivir per pharm -Solumedrol 40 mg bid -Bronchodilators - Z pak due to leukocytosis. -PRN Mucinex for cough -f/u Blood culture, NGTD -Follow-up inflammatory markers -Procalcitonin nagative, lactic acid levels trended down to normal . -IVF: total of 2.0 of  NS bolus, followed by 75 cc/h, decreased to 50 ml/hr   HLD (hyperlipidemia): Patient's not taking medications currently -f/u with PCP   Benign neoplasm of meninges (HCC) -Continue home Keppra prophylaxis of seizure   Acute metabolic encephalopathy: Likely due to ongoing infection and severe sepsis.  CT head negative for acute intracranial abnormalities. -Frequent neuro check As per patient's husband she is back to her baseline     DVT ppx: SQ Lovenox Code Status: Full code per her husband Family Communication:  Yes, patient's husband by phone Disposition Plan:  Anticipate discharge back to previous environment Consults called:  none Admission status and Level of care: Med-Surg:   as inpt           Body mass index is 27.64 kg/m.  Interventions:       Diet: Heart healthy DVT Prophylaxis: Subcutaneous Lovenox   Advance goals of care discussion: Full code  Family Communication: family was present at bedside, at the time of interview.  The pt provided permission to discuss medical plan with the family. Opportunity was given to ask question and all questions were answered satisfactorily.   Disposition:  Pt is from home, admitted with COVID pneumonia, still has respiratory symptoms, shortness of breath, which precludes a safe discharge. Discharge to home with home health/PT, when symptoms resolve, most likely in 1 to 2 days.  Subjective: No significant overnight issues, patient breathing is getting better still has mild cough and wheezing.  Denied any worsening of shortness of breath, no chest pain or palpitations, no any abdominal pain, no nausea vomiting or diarrhea.   Physical Exam: General:  Awake, and alert.  Appear in mild distress,  affect appropriate Eyes: PERRLA ENT: Oral Mucosa Clear, moist  Neck: no JVD,  Cardiovascular: S1 and S2 Present, no Murmur,  Respiratory: good respiratory effort, Bilateral Air entry equal and Decreased, mild  Crackles, mild  wheezes Abdomen: Bowel Sound present, Soft and no tenderness,  Skin: no rashes Extremities: no Pedal edema, no calf tenderness Neurologic: without any new focal findings Gait not checked due to patient safety concerns  Vitals:   01/29/21 2056 01/30/21 0021 01/30/21 0425 01/30/21 0804  BP: (!) 110/50 (!) 121/56 117/63 (!) 101/57  Pulse: 80 78 72 61  Resp: 18 18 18 20   Temp: 97.8 F (36.6 C) 98 F (36.7 C) (!) 97.5 F (36.4 C) 97.6 F (36.4 C)  TempSrc:    Oral  SpO2: 100% 98% 100% 100%  Weight:      Height:        Intake/Output Summary (Last 24 hours) at 01/30/2021 1422 Last data filed at 01/30/2021 0518 Gross per 24 hour  Intake 1114.44 ml  Output 550 ml  Net 564.44 ml   Filed Weights   01/29/21 0229  Weight: 73 kg    Data Reviewed: I have personally reviewed and interpreted daily labs, tele strips, imagings as discussed above. I reviewed all nursing notes, pharmacy notes, vitals, pertinent old records I have discussed plan of care as described above with RN and patient/family.  CBC: Recent Labs  Lab 01/29/21 0239 01/30/21 0433  WBC 13.2* 17.1*  NEUTROABS 11.6* 14.9*  HGB 14.3 13.1  HCT 45.2 39.7  MCV 94.4 93.0  PLT 266 191   Basic Metabolic Panel: Recent Labs  Lab 01/29/21 0239 01/30/21 0433  NA 137 139  K 3.7 3.7  CL 99 111  CO2 29 22  GLUCOSE 134* 129*  BUN 18 16  CREATININE 0.83 0.65  CALCIUM 8.9 7.4*    Studies: CT Angio Chest Pulmonary Embolism (PE) W or WO Contrast  Result Date: 01/30/2021 CLINICAL DATA:  PE suspected, shortness of breath EXAM: CT ANGIOGRAPHY CHEST WITH CONTRAST TECHNIQUE: Multidetector CT imaging of the chest was performed using the standard protocol during bolus administration of intravenous contrast. Multiplanar CT image reconstructions and MIPs were obtained to evaluate the vascular anatomy. CONTRAST:  36mL OMNIPAQUE IOHEXOL 350 MG/ML SOLN COMPARISON:  None. FINDINGS: Cardiovascular: Evaluation of the pulmonary arteries is  significantly limited by breath motion artifact; within this limitation, there is no evidence of pulmonary embolism through the proximal segmental pulmonary arterial level. Normal heart size. No pericardial effusion. Right chest port catheter Mediastinum/Nodes: No enlarged mediastinal, hilar, or axillary lymph nodes. Thyroid gland, trachea, and esophagus demonstrate no significant findings. Lungs/Pleura: Lungs are clear. No pleural effusion or pneumothorax. Upper Abdomen: No acute abnormality. Musculoskeletal: No chest wall abnormality. No acute or significant osseous findings. Review of the MIP images confirms the above findings. IMPRESSION: Evaluation of the pulmonary arteries is significantly limited by breath motion artifact; within this limitation, there is no evidence of pulmonary embolism through the proximal segmental pulmonary arterial level. Electronically Signed   By: Eddie Candle M.D.   On: 01/30/2021 12:48    Scheduled Meds:  ascorbic acid  500 mg Oral Daily   azithromycin  250 mg Oral Daily   enoxaparin (LOVENOX) injection  40 mg Subcutaneous Q24H   ipratropium  2 puff Inhalation Q4H   latanoprost  1 drop Both Eyes QHS   levETIRAcetam  250 mg Oral QHS   methylPREDNISolone (SOLU-MEDROL) injection  40 mg Intravenous Q12H   Vitamin E  400 Units  Oral Daily   Continuous Infusions:  sodium chloride 75 mL/hr at 01/30/21 0518   remdesivir 100 mg in NS 100 mL 100 mg (01/30/21 1114)   PRN Meds: acetaminophen, albuterol, artificial tears, dextromethorphan-guaiFENesin, LORazepam, ondansetron (ZOFRAN) IV, traMADol  Time spent: 35 minutes  Author: Val Riles. MD [facility] 01/30/2021 2:22 PM  To reach On-call, see care teams to locate the attending and reach out to them via [email or web address]. If 7PM-7AM, please contact night-coverage If you still have difficulty reaching the attending provider, please page the [facility] (Director on Call) for [facility] on amion for assistance.

## 2021-01-30 NOTE — Evaluation (Signed)
Occupational Therapy Evaluation Patient Details Name: [name] MRN: [id number] DOB: 09/30/1940 Today's Date: 01/30/2021    History of Present Illness [name] is a 79yoF who comes to [facility] on 01/29/21 2/2 weakness, AMS, cough, SOB. Pt (+) COVID19. PMH: Lt BrCA c axillary mets (s/p lumpectomy, radiation, chemo), meningioma, HOH, demntia, lumbar DDD, s/p brain surgery, chronic right hip pain.   Clinical Impression   [name] was seen for OT evaluation this date. Prior to hospital admission, pt was SUPERVISION for mobility and ADLs using 4WW. Pt lives with hubsnad in home c 2 STE and elevator to 2nd floor bedroom. Pt presents to acute OT demonstrating impaired ADL performance and functional mobility 2/2 decreased activity tolerance and functional strength/balance deficits. Pt currently requires MIN A + RW for ADL t/f - assis to stabilize RW as husband reports pt uses 4WW at baseline. SBA tooth brushing standing sinkside. SETUP don/doff B shoes seated EOB. Pt would benefit from skilled OT to address noted impairments and functional limitations (see below for any additional details) in order to maximize safety and independence while minimizing falls risk and caregiver burden. Upon hospital discharge, recommend HHOT to maximize pt safety and return to functional independence during meaningful occupations of daily life.     Follow Up Recommendations  Home health OT    Equipment Recommendations  None recommended by OT    Recommendations for Other Services       Precautions / Restrictions Precautions Precautions: Fall Restrictions Weight Bearing Restrictions: No      Mobility Bed Mobility Overal bed mobility: Modified Independent                  Transfers Overall transfer level: Needs assistance Equipment used: Rolling walker (2 wheeled) Transfers: Sit to/from Stand Sit to Stand: Min assist Stand pivot transfers: Min assist            Balance Overall balance  assessment: Needs assistance Sitting-balance support: No upper extremity supported;Feet supported Sitting balance-Leahy Scale: Good     Standing balance support: Single extremity supported;During functional activity Standing balance-Leahy Scale: Fair                             ADL either performed or assessed with clinical judgement   ADL Overall ADL's : Needs assistance/impaired                                       General ADL Comments: MIN A + RW for ADL t/f - assis to stabilize RW as husband reports pt uses 4WW at baseline. SBA tooth brushing standing sinkside. SETUP don/doff B shoes seated EOB      Pertinent Vitals/Pain Pain Assessment: No/denies pain     Hand Dominance     Extremity/Trunk Assessment Upper Extremity Assessment Upper Extremity Assessment: Generalized weakness   Lower Extremity Assessment Lower Extremity Assessment: Generalized weakness       Communication Communication Communication: HOH   Cognition Arousal/Alertness: Awake/alert Behavior During Therapy: WFL for tasks assessed/performed Overall Cognitive Status: History of cognitive impairments - at baseline                                     General Comments  SpO2 90% on RA    Exercises Exercises: Other exercises Other   Exercises Other Exercises: Pt and husband educated re; OT role, DME recs, d/c recs, falls preveniton, home/routines modifications Other Exercises: LBD, grooming, sup<>sit, sit<>stand, sitting/standing balance/tolerance   Shoulder Instructions      Home Living Family/patient expects to be discharged to:: Private residence Living Arrangements: Spouse/significant other   Type of Home: House Home Access: Stairs to Probation officer of Steps: 2 from garage Entrance Stairs-Rails: Can reach both Home Layout: Two level Alternate Level Stairs-Number of Steps: elevator to 2nd story bedroom             Home  Equipment: Environmental consultant - 2 wheels;Walker - 4 wheels          Prior Functioning/Environment Level of Independence: Needs assistance  Gait / Transfers Assistance Needed: Physical assistance needed with RW for household distance AMB; ADL's / Homemaking Assistance Needed: Requires assist for bathing, set-up for dressing (minA for socks/shoes), no problems with self-feeding.   Comments: no falls in 6 months, but a few close calls;        OT Problem List: Decreased strength;Decreased activity tolerance;Impaired balance (sitting and/or standing);Decreased safety awareness      OT Treatment/Interventions: Self-care/ADL training;Therapeutic exercise;Energy conservation;DME and/or AE instruction;Therapeutic activities;Balance training;Patient/family education    OT Goals(Current goals can be found in the care plan section) Acute Rehab OT Goals Patient Stated Goal: return to PLOF OT Goal Formulation: With patient/family Time For Goal Achievement: 02/13/21 Potential to Achieve Goals: Good ADL Goals Pt Will Perform Grooming: Independently;standing Pt Will Perform Lower Body Dressing: with modified independence;sit to/from stand (c LRAD PRN) Pt Will Transfer to Toilet: with modified independence;ambulating;regular height toilet (C lrad prn)  OT Frequency: Min 1X/week    AM-PAC OT "6 Clicks" Daily Activity     Outcome Measure Help from another person eating meals?: None Help from another person taking care of personal grooming?: A Little Help from another person toileting, which includes using toliet, bedpan, or urinal?: A Little Help from another person bathing (including washing, rinsing, drying)?: A Little Help from another person to put on and taking off regular upper body clothing?: None Help from another person to put on and taking off regular lower body clothing?: A Little 6 Click Score: 20   End of Session Equipment Utilized During Treatment: Rolling walker  Activity Tolerance: Patient  tolerated treatment well Patient left: in bed;with call bell/phone within reach;with bed alarm set;with family/visitor present  OT Visit Diagnosis: Other abnormalities of gait and mobility (R26.89);Muscle weakness (generalized) (M62.81)                Time: 4193-7902 OT Time Calculation (min): 15 min Charges:  OT General Charges $OT Visit: 1 Visit OT Evaluation $OT Eval Low Complexity: 1 Low OT Treatments $Self Care/Home Management : 8-22 mins  Dessie Coma, M.S. OTR/L  01/30/21, 2:19 PM  ascom [phone]

## 2021-01-30 NOTE — Progress Notes (Signed)
[name] made aware that pt got transferred to room 119.

## 2021-01-30 NOTE — Consult Note (Signed)
[place]  [phone] [phone]   Name: [name] Date: 01/30/2021 MRN: [id number]  DOB: [birth year]  Patient Care Team: [name], MD as PCP - General (Internal Medicine) Bary Castilla, [name], MD (General Surgery) Requested, Self Cammie Sickle, MD as Consulting Physician (Internal Medicine)    REASON FOR CONSULTATION: [name] is a 80 y.o. female with multiple medical problems including dementia and metastatic breast cancer status postlumpectomy XRT and chemotherapy on active surveillance with no evidence of metastatic disease on CT in May 2022.  Patient was admitted to the hospital on 01/29/2021 with sepsis from COVID pneumonia.  Palliative care was consulted up address goals.  SOCIAL HISTORY:     reports that she quit smoking about 48 years ago. Her smoking use included cigarettes. She has a 1.25 pack-year smoking history. She has never used smokeless tobacco. She reports current alcohol use of about 1.0 - 2.0 standard drink of alcohol per week. She reports that she does not use drugs.  Patient is married and lives at home with her husband.  ADVANCE DIRECTIVES:  None on file  CODE STATUS: Full code  PAST MEDICAL HISTORY: Past Medical History:  Diagnosis Date   Arthritis    Brain tumor ([place]) 1995   meningeoma   Breast cancer ([place]) 2018   left breast; surgery 01/11/15 with Dr. Bary Castilla; path with invasive mammary and DCIS   Breast cancer of upper-inner quadrant of left female breast ([place]) 12/15/14   Completed radiation end of December and finished chemotherapy 2 weeks ago, Left breast invasive mammary carcinoma, T1cN53mc (1.5 cm); Grade 3, [name], [name], [name], .   Cataract    bilat    DDD (degenerative disc disease), lumbar    Lumbar, previously evaluated by Dr. 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   Numbness and tingling    right hand    Osteoarthritis    Osteoporosis, post-menopausal    Personal history of chemotherapy    Personal history of radiation therapy    Shoulder pain, right    Wears glasses     PAST SURGICAL HISTORY:  Past Surgical History:  [place]   [place]   BREAST BIOPSY Left 12/15/14   confirmed DCIS   BREAST BIOPSY Left 01/08/2015   Procedure: BREAST BIOPSY WITH NEEDLE LOCALIZATION;  Surgeon: [name] MD;  Location: [facility] ORS;  Service: General;  Laterality: Left;   BREAST EXCISIONAL BIOPSY Left 1997   BREAST LUMPECTOMY Left 01/08/2015   Procedure: LUMPECTOMY;  Surgeon: [name] MD;  Location: [facility] ORS;  Service: General;  Laterality: Left;   COLONOSCOPY  2010   Dr. ETiffany Kocher  ORIF ANKLE FRACTURE Right 08/05/2017   Procedure: OPEN REDUCTION INTERNAL FIXATION (ORIF) ANKLE FRACTURE;  Surgeon: MEarnestine Leys MD;  Location: [facility] ORS;  Service: Orthopedics;  Laterality: Right;   PORTACATH PLACEMENT Right 01/16/2015   Procedure: INSERTION PORT-A-CATH;  Surgeon: [name], MD;  Location: [facility] ORS;  Service: General;  Laterality: Right;   SENTINEL NODE BIOPSY Left 01/16/2015   Procedure: SENTINEL NODE BIOPSY;  Surgeon: [name], MD;  Location: [facility] ORS;  Service: General;  Laterality: Left;   TOTAL HIP ARTHROPLASTY Right 09/04/2015   Procedure: RIGHT TOTAL HIP ARTHROPLASTY ANTERIOR APPROACH;  Surgeon: [name], MD;  Location: [facility];  Service: Orthopedics;  Laterality: Right;     HEMATOLOGY/ONCOLOGY HISTORY:  Oncology History Overview Note  # June 2016- LEFT BREAST CA;  [name] ; [name] ; [name]; Her 2 Neu POS; [place] from July OF 2016; [name]; adjuvant Herceptin [ Finished July 2017]; AUG 2017- Neratinib x5 days; DISCON sec to diarrhea  # MID OCT 2018- Right breast mass-Bx- ER/PR-NEG; Her 2 NEU POSITIVE 1~2.5cm;  [?NEW primary]  # MID-OCT 2018-METASTATIC RECURRENT-oh sternal mass; Left Ax LN [Bx]/periportal LN  # OCT 25th 2018- TAXOL-HERCEPTIN-PERJETA; Jan 2019- CT PR; continue HP only; on HOLD since NOV 2019--sec to drop in EF/Sieziues  # July 15th 2020- RE-STARTED HP   -------------------------------------------------------------------  # MUGA scan- July 28th 2017- 67%.  December 2019-EF 53%; January 2020 EF-52%  # chronic gait/balance issues  # Seizures/petit-mal/ Dr.Shah-Keppra [name] [id number]]  # June 2017- left breast Bx- fat necrosis [[name]  # july 2017-  BRCA 1& 2- NEG.   MOLECULAR TESTING- F ONE- TPS- 0%;  ERB2 amplification; PI3K/RET amplification Others**  # PALLIATIVE CARE: P  --------------------------------------------------    DIAGNOSIS: [ OCT 2671]- REC/MET- BREAST CA ER/PR-NEG; her 2 POS  STAGE: 4  ;GOALS: Palliative  CURRENT/MOST RECENT THERAPY- Herceptin-Perjeta [C]    Carcinoma of overlapping sites of left breast in female, estrogen receptor negative ([place])    ALLERGIES:  is allergic to donepezil.  MEDICATIONS:  Current Facility-Administered Medications  Medication Dose Route Frequency Provider Last Rate Last Admin   0.9 %  sodium chloride infusion   Intravenous Continuous Ivor Costa, MD 75 mL/hr at 01/30/21 0518 Infusion Verify at 01/30/21 0518   acetaminophen (TYLENOL) tablet 650 mg  650 mg Oral Q6H PRN Ivor Costa, MD       albuterol (VENTOLIN HFA) 108 (90 Base) MCG/ACT inhaler 2 puff  2 puff Inhalation Q4H PRN Ivor Costa, MD       artificial tears (LACRILUBE)  ophthalmic ointment   Both Eyes QHS PRN Dorothe Pea, RPH       ascorbic acid (VITAMIN C) tablet 500 mg  500 mg Oral Daily Ivor Costa, MD   500 mg at 01/30/21 1116   azithromycin (ZITHROMAX) tablet 250 mg  250 mg Oral Daily Ivor Costa, MD   250 mg at 01/30/21 1116   dextromethorphan-guaiFENesin ([place] DM) 30-600 MG per 12 hr tablet 1 tablet  1 tablet Oral BID PRN Ivor Costa, MD   1 tablet at 01/29/21 2123   enoxaparin (LOVENOX) injection 40 mg  40 mg Subcutaneous Q24H Ivor Costa, MD   40 mg at 01/29/21 2129   ipratropium (ATROVENT HFA) inhaler 2 puff  2 puff Inhalation Q4H Ivor Costa, MD   2 puff at 01/30/21 1116   latanoprost (XALATAN) 0.005 % ophthalmic solution 1 drop  1 drop Both Eyes QHS Ivor Costa, MD   1 drop at 01/29/21 2124   levETIRAcetam (KEPPRA) tablet 250 mg  250 mg Oral QHS Niu,  Soledad Gerlach, MD   250 mg at 01/29/21 2123   LORazepam (ATIVAN) injection 1 mg  1 mg Intravenous Q2H PRN Ivor Costa, MD       methylPREDNISolone sodium succinate (SOLU-MEDROL) 40 mg/mL injection 40 mg  40 mg Intravenous Q12H Ivor Costa, MD   40 mg at 01/30/21 0558   ondansetron (ZOFRAN) injection 4 mg  4 mg Intravenous Q8H PRN Ivor Costa, MD       remdesivir 100 mg in sodium chloride 0.9 % 100 mL IVPB  100 mg Intravenous Daily Ivor Costa, MD 200 mL/hr at 01/30/21 1114 100 mg at 01/30/21 1114   traMADol (ULTRAM) tablet 50 mg  50 mg Oral BID PRN Ivor Costa, MD       Vitamin E CAPS 400 Units  400 Units Oral Daily Ivor Costa, MD       Facility-Administered Medications Ordered in Other Encounters  Medication Dose Route Frequency Provider Last Rate Last Admin   heparin lock flush 100 unit/mL  500 Units Intracatheter Once PRN Cammie Sickle, MD       sodium chloride flush (NS) 0.9 % injection 10 mL  10 mL Intravenous PRN Cammie Sickle, MD   10 mL at 07/07/18 0830    VITAL SIGNS: BP (!) 101/57 (BP Location: Right Arm)   Pulse 61   Temp 97.6 F (36.4 C) (Oral)   Resp 20   Ht 5' 4" (1.626 m)   Wt  161 lb (73 kg)   SpO2 100%   BMI 27.64 kg/m  Filed Weights   01/29/21 0229  Weight: 161 lb (73 kg)    Estimated body mass index is 27.64 kg/m as calculated from the following:   Height as of this encounter: 5' 4" (1.626 m).   Weight as of this encounter: 161 lb (73 kg).  LABS: CBC:    Component Value Date/Time   WBC 17.1 (H) 01/30/2021 0433   HGB 13.1 01/30/2021 0433   HCT 39.7 01/30/2021 0433   PLT 241 01/30/2021 0433   MCV 93.0 01/30/2021 0433   NEUTROABS 14.9 (H) 01/30/2021 0433   LYMPHSABS 1.2 01/30/2021 0433   MONOABS 0.9 01/30/2021 0433   EOSABS 0.0 01/30/2021 0433   BASOSABS 0.0 01/30/2021 0433   Comprehensive Metabolic Panel:    Component Value Date/Time   NA 139 01/30/2021 0433   K 3.7 01/30/2021 0433   CL 111 01/30/2021 0433   CO2 22 01/30/2021 0433   BUN 16 01/30/2021 0433   CREATININE 0.65 01/30/2021 0433   GLUCOSE 129 (H) 01/30/2021 0433   CALCIUM 7.4 (L) 01/30/2021 0433   AST 20 01/30/2021 0433   ALT 14 01/30/2021 0433   ALKPHOS 39 01/30/2021 0433   BILITOT 0.3 01/30/2021 0433   PROT 5.4 (L) 01/30/2021 0433   ALBUMIN 2.8 (L) 01/30/2021 0433    RADIOGRAPHIC STUDIES: DG Chest 2 View  Result Date: 01/29/2021 CLINICAL DATA:  New cough and wheezing. Near syncopal event tonight. EXAM: CHEST - 2 VIEW COMPARISON:  CT chest 11/29/2020 FINDINGS: Central venous catheter with tip over the cavoatrial junction region. No pneumothorax. Shallow inspiration. Heart size and pulmonary vascularity appear normal. Lungs are clear. No pleural effusions. No pneumothorax. Mediastinal contours appear intact. Calcification of the aorta. IMPRESSION: No active cardiopulmonary disease. Electronically Signed   By: Lucienne Capers M.D.   On: 01/29/2021 03:26   CT Head Wo Contrast  Result Date: 01/29/2021 CLINICAL DATA:  Near syncopal event.  Encephalopathy. EXAM: CT HEAD WITHOUT CONTRAST TECHNIQUE: Contiguous axial  images were obtained from the base of the skull through the vertex  without intravenous contrast. COMPARISON:  01/04/2017 FINDINGS: Brain: There is no mass, hemorrhage or extra-axial collection. There is generalized atrophy without lobar predilection. Hypodensity of the white matter is most commonly associated with chronic microvascular disease. Vascular: Atherosclerotic calcification of the internal carotid arteries at the skull base. No abnormal hyperdensity of the major intracranial arteries or dural venous sinuses. Skull: Remote left craniotomy Sinuses/Orbits: Sphenoid sinus mucosal thickening. The orbits are normal. IMPRESSION: Generalized atrophy and chronic microvascular ischemia without acute intracranial abnormality. Electronically Signed   By: Ulyses Jarred M.D.   On: 01/29/2021 03:45   CT Angio Chest Pulmonary Embolism (PE) W or WO Contrast  Result Date: 01/30/2021 CLINICAL DATA:  PE suspected, shortness of breath EXAM: CT ANGIOGRAPHY CHEST WITH CONTRAST TECHNIQUE: Multidetector CT imaging of the chest was performed using the standard protocol during bolus administration of intravenous contrast. Multiplanar CT image reconstructions and MIPs were obtained to evaluate the vascular anatomy. CONTRAST:  55m OMNIPAQUE IOHEXOL 350 MG/ML SOLN COMPARISON:  None. FINDINGS: Cardiovascular: Evaluation of the pulmonary arteries is significantly limited by breath motion artifact; within this limitation, there is no evidence of pulmonary embolism through the proximal segmental pulmonary arterial level. Normal heart size. No pericardial effusion. Right chest port catheter Mediastinum/Nodes: No enlarged mediastinal, hilar, or axillary lymph nodes. Thyroid gland, trachea, and esophagus demonstrate no significant findings. Lungs/Pleura: Lungs are clear. No pleural effusion or pneumothorax. Upper Abdomen: No acute abnormality. Musculoskeletal: No chest wall abnormality. No acute or significant osseous findings. Review of the MIP images confirms the above findings. IMPRESSION: Evaluation  of the pulmonary arteries is significantly limited by breath motion artifact; within this limitation, there is no evidence of pulmonary embolism through the proximal segmental pulmonary arterial level. Electronically Signed   By: AEddie CandleM.D.   On: 01/30/2021 12:48   NM Cardiac Muga Rest  Result Date: 01/22/2021 CLINICAL DATA:  Breast cancer. Evaluate cardiac function in relation to chemotherapy. EXAM: NUCLEAR MEDICINE CARDIAC BLOOD POOL IMAGING (MUGA) TECHNIQUE: Cardiac multi-gated acquisition was performed at rest following intravenous injection of Tc-975mabeled red blood cells. RADIOPHARMACEUTICALS:  21.4 mCi Tc-9989mrtechnetate in-vitro labeled red blood cells IV COMPARISON:  August scan 11/27/2020 06/15/2020 FINDINGS: No  focal wall motion abnormality of the left ventricle. Calculated left ventricular ejection fraction equals 52.2 %. Comparable to 52.9 % on 11/27/2020 IMPRESSION: Left ventricular ejection fraction equals52.2 %. Electronically Signed   By: SteSuzy BouchardD.   On: 01/22/2021 14:36   DG Hip Unilat With Pelvis 2-3 Views Right  Result Date: 01/29/2021 CLINICAL DATA:  Right hip pain after a fall.  Near syncopal event. EXAM: DG HIP (WITH OR WITHOUT PELVIS) 2-3V RIGHT COMPARISON:  CT abdomen and pelvis 11/29/2020 FINDINGS: Right total hip arthroplasty with non cemented components. Components appear well seated. No evidence of acute fracture or dislocation. Pelvis and visualized left hip appear intact. Degenerative changes in the lower lumbar spine and left hip. Calcifications in the pelvis likely represent calcified uterine fibroids. IMPRESSION: Right total hip arthroplasty appears well seated. No evidence of acute fracture or dislocation. Electronically Signed   By: WilLucienne CapersD.   On: 01/29/2021 03:27    PERFORMANCE STATUS (ECOG) : 2 - Symptomatic, <50% confined to bed  Review of Systems Unless otherwise noted, a complete review of systems is negative.  Physical  Exam General: Frail appearing Pulmonary: Unlabored Extremities: no edema, no joint deformities Skin: no rashes Neurological: Weakness but  otherwise nonfocal  IMPRESSION: Patient was initially with altered mental status per H&P but today appears to be improved.  She knew she was in the hospital but not where or why.  Communication is somewhat limited by her difficulty hearing.  However, she denies any distressing symptoms at present including shortness of breath or pain.  Patient is noted to have history of metastatic breast cancer but is currently on surveillance after completing course of treatment with lumpectomy/XRT/chemotherapy.  Her last CT of the chest, abdomen, and pelvis on 11/29/2020 showed no evidence of metastatic disease.  Patient is followed in the clinic by Dr. Rogue Bussing.  I tried calling patient's husband to discuss goals but did not reach him.  I will try him again.  I am also happy to follow patient in our outpatient palliative care clinic.  PLAN: -Continue current scope of treatment -Probable dispo: Home with home health -Would be happy to follow patient in the palliative care clinic  Case and plan discussed with Dr. Rogue Bussing   Time Total: 30 minutes  Visit consisted of counseling and education dealing with the complex and emotionally intense issues of symptom management and palliative care in the setting of serious and potentially life-threatening illness.Greater than 50%  of this time was spent counseling and coordinating care related to the above assessment and plan.  Signed by: Altha Harm, PhD, NP-C

## 2021-01-30 NOTE — Plan of Care (Signed)
  Problem: Respiratory: Goal: Will maintain a patent airway Outcome: Progressing Goal: Complications related to the disease process, condition or treatment will be avoided or minimized Outcome: Progressing   Problem: Coping: Goal: Psychosocial and spiritual needs will be supported Outcome: Progressing   Problem: Safety: Goal: Ability to remain free from injury will improve Outcome: Progressing   Problem: Activity: Goal: Risk for activity intolerance will decrease Outcome: Progressing

## 2021-01-30 NOTE — Evaluation (Signed)
Physical Therapy Evaluation Patient Details Name: [name] MRN: [id number] DOB: Dec 09, 1940 Today's Date: 01/30/2021   History of Present Illness  [name] is a 64yoF who [facility] 2/2 weakness, AMS, cough, SOB. Pt (+) COVID19. PMH: Lt BrCA c axillary mets (s/p lumpectomy, radiation, chemo), meningioma, HOH, demntia, lumbar DDD, s/p brain surgery, chronic right hip pain.  Clinical Impression  Pt admitted with above diagnosis. Pt currently with functional limitations due to the deficits listed below (see "PT Problem List"). Upon entry, pt in bed, asleep. Husband is at bedside, he is primary caregiver. Husband educated on need for negative pressure fan being on, author plugs in and turns on. Husband provides PLOF, then wakes patient for session. The pt is alert, pleasant, interactive, flat affect and calm. Pt follows simple navigation cues in room ~75% of time, struggles with cues for turning direction, poor IV awareness safety awareness. Pt mobilizing very near baseline per husband, limited to ~26f AMB due to fatigue, but HR/BP stable on room air. Husband prefers DC to home soon to prevent acute delirium. Pt demonstrating impairment of strength, balance, O2 perfusion, and activity tolerance, limiting indep in ADL and functional mobility within the home and community. Pt will benefit from skilled PT intervention to address the above deficits in order to restore patient to PLOF and improve safety for return to home.       Follow Up Recommendations Home health PT;Supervision - Intermittent    Equipment Recommendations  None recommended by PT    Recommendations for Other Services       Precautions / Restrictions Precautions Precautions: Fall Restrictions Weight Bearing Restrictions: No      Mobility  Bed Mobility Overal bed mobility: Modified Independent                  Transfers Overall transfer level: Needs assistance Equipment used: Rolling walker (2  wheeled) Transfers: Stand Pivot Transfers;Sit to/from Stand Sit to Stand: Min guard Stand pivot transfers: Min assist          Ambulation/Gait Ambulation/Gait assistance: Supervision Gait Distance (Feet): 80 Feet Assistive device: Rolling walker (2 wheeled) Gait Pattern/deviations: WFL(Within Functional Limits)     General Gait Details: distance limited by fatigue, O2/HR stabie with activity; husband reports AMB appears near baseline.  Stairs            Wheelchair Mobility    Modified Rankin (Stroke Patients Only)       Balance                                             Pertinent Vitals/Pain Pain Assessment: No/denies pain    Home Living Family/patient expects to be discharged to:: Private residence Living Arrangements: Spouse/significant other   Type of Home: House Home Access: Stairs to enter;Elevator (elevators to bedroom) Entrance Stairs-Rails: Can reach both (but requires assist from husband.) Entrance Stairs-Number of Steps: 2 [place] Two level        Prior Function Level of Independence: Needs assistance   Gait / Transfers Assistance Needed: Physical assistance needed with RW for household distance AMB;  ADL's / Homemaking Assistance Needed: Requires assist for bathing, set-up for dressing (minA for socks/shoes), no problems with self-feeding.  Comments: no falls in 6 months, but a few close calls;     Hand Dominance  Extremity/Trunk Assessment                Communication      Cognition Arousal/Alertness: Awake/alert Behavior During Therapy: WFL for tasks assessed/performed Overall Cognitive Status: History of cognitive impairments - at baseline                                        General Comments      Exercises Other Exercises Other Exercises: 5xSTS from EOB c RW (extensive cues required due to difficulty attending to task/impulsivity)   Assessment/Plan     PT Assessment Patient needs continued PT services  PT Problem List Decreased knowledge of precautions;Decreased safety awareness;Decreased knowledge of use of DME;Decreased cognition       PT Treatment Interventions DME instruction;Functional mobility training;Therapeutic activities;Therapeutic exercise;Patient/family education    PT Goals (Current goals can be found in the Care Plan section)  Acute Rehab PT Goals Patient Stated Goal: avoid physical decline whilst admitted PT Goal Formulation: With family Time For Goal Achievement: 02/13/21 Potential to Achieve Goals: Good    Frequency Min 2X/week   Barriers to discharge        Co-evaluation               AM-PAC PT "6 Clicks" Mobility  Outcome Measure Help needed turning from your back to your side while in a flat bed without using bedrails?: A Little Help needed moving from lying on your back to sitting on the side of a flat bed without using bedrails?: A Little Help needed moving to and from a bed to a chair (including a wheelchair)?: A Little Help needed standing up from a chair using your arms (e.g., wheelchair or bedside chair)?: A Little Help needed to walk in hospital room?: A Lot Help needed climbing 3-5 steps with a railing? : A Lot 6 Click Score: 16    End of Session   Activity Tolerance: Patient tolerated treatment well;Patient limited by fatigue Patient left: in bed;with family/visitor present;with call bell/phone within reach Nurse Communication: Mobility status PT Visit Diagnosis: Other abnormalities of gait and mobility (R26.89);Other symptoms and signs involving the nervous system (R29.898)    Time: 5643-3295 PT Time Calculation (min) (ACUTE ONLY): 35 min   Charges:   PT Evaluation $PT Eval Moderate Complexity: 1 Mod PT Treatments $Therapeutic Exercise: 8-22 mins      12:23 PM, 01/30/21 Etta Grandchild, PT, DPT Physical Therapist - [facility]  [phone]  ([place])    Tillson C 01/30/2021, 12:13 PM

## 2021-01-30 NOTE — TOC Initial Note (Signed)
Transition of Care [facility]) - Initial/Assessment Note    Patient Details  Name: [name] MRN: [id number] Date of Birth: Dec 05, 1940  Transition of Care [facility]) CM/SW Contact:    Michele Hutching, RN Phone Number: 01/30/2021, 3:36 PM  Clinical Narrative:                 Patient admitted to the hospital with COVID, not requiring any supplemental oxygen.  Potential for discharge  home with home health services tomorrow.  Patient is from home with her husband, she has history of dementia and her husband is primary care giver.  RNCM has called husband and left a message for return call to discuss discharge planning.    Expected Discharge Plan: [place] Barriers to Discharge: Continued Medical Work up   Patient Goals and CMS Choice   CMS Medicare.gov Compare Post Acute Care list provided to:: [name] Choice offered to / list presented to : Spouse  Expected Discharge Plan and Services Expected Discharge Plan: [place]   Discharge Planning Services: CM Consult Post Acute Care Choice: [place] arrangements for the past 2 months: Single Family Home                 DME Arranged: N/A DME Agency: NA       HH Arranged: PT, OT          Prior Living Arrangements/Services Living arrangements for the past 2 months: Single Family Home Lives with:: Spouse Patient language and need for interpreter reviewed:: Yes Do you feel safe going back to the place where you live?: Yes      Need for Family Participation in Patient Care: Yes (Comment) (COVID) Care giver support system in place?: Yes (comment)   Criminal Activity/Legal Involvement Pertinent to Current Situation/Hospitalization: No - Comment as needed  Activities of Daily Living Home Assistive Devices/Equipment: Hearing aid, Walker (specify type) ADL Screening (condition at time of admission) Patient's cognitive ability adequate to safely complete daily  activities?: No Is the patient deaf or have difficulty hearing?: Yes Does the patient have difficulty seeing, even when wearing glasses/contacts?: No Does the patient have difficulty concentrating, remembering, or making decisions?: Yes Patient able to express need for assistance with ADLs?: Yes Does the patient have difficulty dressing or bathing?: Yes Independently performs ADLs?: No Communication: Independent Dressing (OT): Needs assistance Is this a change from baseline?: Pre-admission baseline Grooming: Needs assistance Is this a change from baseline?: Pre-admission baseline Feeding: Needs assistance Is this a change from baseline?: Pre-admission baseline Bathing: Needs assistance Is this a change from baseline?: Pre-admission baseline Toileting: Needs assistance Is this a change from baseline?: Pre-admission baseline In/Out Bed: Needs assistance Is this a change from baseline?: Pre-admission baseline Walks in Home: Needs assistance Is this a change from baseline?: Pre-admission baseline Does the patient have difficulty walking or climbing stairs?: Yes Weakness of Legs: Both Weakness of Arms/Hands: Both  Permission Sought/Granted Permission sought to share information with : Case Manager, Family Supports, Other (comment) Permission granted to share information with : Yes, Verbal Permission Granted  Share Information with NAME: [name]  Permission granted to share info w AGENCY: home health agency  Permission granted to share info w Relationship: husband     Emotional Assessment       Orientation: : Oriented to Self, Oriented to Place, Oriented to Situation Alcohol / Substance Use: Not Applicable Psych Involvement: No (comment)  Admission diagnosis:  Weakness generalized [place].Ralphs.1]  COVID-19 virus infection [U07.1] Acute respiratory disease due to COVID-19 virus [U07.1, J06.9] COVID-19 [U07.1] Patient Active Problem List   Diagnosis Date Noted   Palliative care  encounter    COVID-19 virus infection 01/29/2021   Acute respiratory disease due to COVID-19 virus 79/43/2761   Acute metabolic encephalopathy 47/03/2956   Severe sepsis ([place]) 01/29/2021   Closed anterior dislocation of humerus 10/18/2019   Primary localized osteoarthritis of pelvic region and thigh 10/18/2019   Shoulder pain 10/18/2019   Spinal stenosis of lumbar region 10/18/2019   Stiffness of shoulder joint 10/18/2019   Hyperlipidemia 10/18/2019   Sprain of ankle 09/06/2018   Acute low back pain 10/01/2017   Nonintractable epilepsy without status epilepticus ([place]) 09/26/2017   Encounter for monitoring cardiotoxic drug therapy 09/10/2017   Closed trimalleolar fracture of right ankle 08/21/2017   Ankle fracture, bimalleolar, closed, right, initial encounter 08/05/2017   Closed right ankle fracture 08/05/2017   Breast cancer metastasized to axillary lymph node, left ([place]) 06/03/2017   Gait instability 05/14/2017   Goals of care, counseling/discussion 05/14/2017   Cardiac syncope 01/06/2017   Syncope 01/04/2017   Bursitis of right hip 03/04/2016   History of total hip arthroplasty 03/04/2016   Carcinoma of overlapping sites of left breast in female, estrogen receptor negative ([place]) 01/23/2016   S/P right THA, AA 09/04/2015   History of artificial joint 09/04/2015   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 08/30/2015   Late onset Alzheimer's disease without behavioral disturbance ([place]) 08/30/2015   Neoplasm of meninges 07/18/2015   History of breast cancer 01/24/2015   DDD (degenerative disc disease), lumbar 01/02/2015   HLD (hyperlipidemia) 01/02/2015   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

## 2021-01-31 LAB — COMPREHENSIVE METABOLIC PANEL
ALT: 21 U/L (ref 0–44)
AST: 21 U/L (ref 15–41)
Albumin: 3.2 g/dL — ABNORMAL LOW (ref 3.5–5.0)
Alkaline Phosphatase: 38 U/L (ref 38–126)
Anion gap: 7 (ref 5–15)
BUN: 17 mg/dL (ref 8–23)
CO2: 26 mmol/L (ref 22–32)
Calcium: 8.3 mg/dL — ABNORMAL LOW (ref 8.9–10.3)
Chloride: 105 mmol/L (ref 98–111)
Creatinine, Ser: 0.62 mg/dL (ref 0.44–1.00)
GFR, Estimated: >60 mL/min (ref >60)
Glucose, Bld: 114 mg/dL — ABNORMAL HIGH (ref 70–99)
Potassium: 4 mmol/L (ref 3.5–5.1)
Sodium: 138 mmol/L (ref 135–145)
Total Bilirubin: 0.4 mg/dL (ref 0.3–1.2)
Total Protein: 5.9 g/dL — ABNORMAL LOW (ref 6.5–8.1)

## 2021-01-31 LAB — CBC WITH DIFFERENTIAL/PLATELET
Abs Immature Granulocytes: 0.08 10*3/uL — ABNORMAL HIGH (ref 0.00–0.07)
Basophils Absolute: 0 10*3/uL (ref 0.0–0.1)
Basophils Relative: 0 %
Eosinophils Absolute: 0 10*3/uL (ref 0.0–0.5)
Eosinophils Relative: 0 %
HCT: 38.6 % (ref 36.0–46.0)
Hemoglobin: 12.4 g/dL (ref 12.0–15.0)
Immature Granulocytes: 1 %
Lymphocytes Relative: 8 %
Lymphs Abs: 1 10*3/uL (ref 0.7–4.0)
MCH: 30.5 pg (ref 26.0–34.0)
MCHC: 32.1 g/dL (ref 30.0–36.0)
MCV: 94.8 fL (ref 80.0–100.0)
Monocytes Absolute: 0.5 10*3/uL (ref 0.1–1.0)
Monocytes Relative: 4 %
Neutro Abs: 11.7 10*3/uL — ABNORMAL HIGH (ref 1.7–7.7)
Neutrophils Relative %: 87 %
Platelets: 290 10*3/uL (ref 150–400)
RBC: 4.07 MIL/uL (ref 3.87–5.11)
RDW: 14.3 % (ref 11.5–15.5)
WBC: 13.3 10*3/uL — ABNORMAL HIGH (ref 4.0–10.5)
nRBC: 0 % (ref 0.0–0.2)

## 2021-01-31 LAB — PHOSPHORUS: Phosphorus: 3.7 mg/dL (ref 2.5–4.6)

## 2021-01-31 LAB — MAGNESIUM: Magnesium: 1.8 mg/dL (ref 1.7–2.4)

## 2021-01-31 MED ORDER — VITAMIN D (ERGOCALCIFEROL) 1.25 MG (50000 UNIT) PO CAPS: 50000.0000 [IU] | ORAL_CAPSULE | ORAL | 3 refills | Status: DC

## 2021-01-31 NOTE — TOC Transition Note (Signed)
Transition of Care [facility]) - CM/SW Discharge Note   Patient Details  Name: DERRIKA RUFFALO MRN: [id number] Date of Birth: [birth year]  [facility] CM/SW Contact:  Shelbie Hutching, RN Phone Number: 01/31/2021, 3:03 PM   Clinical Narrative:    Patient medically cleared for discharge home with home health services.  Corene Cornea with [facility] accepted home health referral for RN, PT, and OT.  Husband will take patient home today.     Final next level of care: Home w Home Health Services Barriers to Discharge: Barriers Resolved   Patient Goals and CMS Choice Patient states their goals for this hospitalization and ongoing recovery are:: husband wants to get home with home health services CMS Medicare.gov Compare Post Acute Care list provided to:: [name] Choice offered to / list presented to : Spouse  Discharge Placement                       Discharge Plan and Services   Discharge Planning Services: CM Consult Post Acute Care Choice: Home Health          DME Arranged: N/A DME Agency: NA       HH Arranged: RN, PT, OT [place] Agency: [place] ([place]) Date HH Agency Contacted: 01/31/21 Time [place]: 1300 Representative spoke with at [place]: [place] ([place]) Interventions     Readmission Risk Interventions No flowsheet data found.

## 2021-01-31 NOTE — Discharge Summary (Signed)
[facility] Discharge Summary   Patient: [name] JHE:[id number]  PCP: [name], MD  Date of admission: 01/29/2021   Date of discharge:  01/31/2021     Discharge Diagnoses:  Principal Problem:   Acute respiratory disease due to COVID-19 virus Active Problems:   HLD (hyperlipidemia)   Benign neoplasm of meninges ([place])   Acute metabolic encephalopathy   Severe sepsis ([place])   Palliative care encounter   Admitted From: Home Disposition:  Home with HH/PT  Recommendations for Outpatient Follow-up:  PCP: in 1 wk Follow up LABS/TEST:  CXR in 4  wk and Vit D level in 6 months   Follow-up Information     Tama High III, MD. Schedule an appointment as soon as possible for a visit in 3 days.   Specialty: Internal Medicine Contact information: [place] [address] [phone]                Diet recommendation: Regular diet  Activity: The patient is advised to gradually reintroduce usual activities, as tolerated  Discharge Condition: stable  Code Status: Full code   History of present illness: As per the H and P dictated on admission SHAWAN TOSH is a 80 y.o. 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 ED Course: pt was found to have positive COVID-19 PCR, WBC 13.2, troponin level 3, BNP 8.2, negative urinalysis, electrolytes renal function okay, temperature normal, blood pressure 121/46, heart rate 88, RR 25, oxygen saturation 90, 95, 100% on room air, chest x-ray negative for infiltration.  CT of head is negative for acute intracranial abnormalities.  Right hip x-ray is negative for bony fracture.  Patient  is admitted to [place] bed by accepting MD. Hospital Course:  Assessment and Plan: Principal Problem:   Acute respiratory disease due to COVID-19 virus Active Problems:   DDD (degenerative disc disease), lumbar   HLD (hyperlipidemia)   Benign neoplasm of meninges (HCC)   Acute metabolic encephalopathy   Severe sepsis ([place])  #Severe sepsis due to acute respiratory disease due to COVID-19 virus: Oxygen saturation 90-100% on room air.  Chest x-ray negative for infiltration.  Patient has wheezing on auscultation indicating bronchospasm.  Patient meets criteria for severe sepsis with leukocytosis WBC 13.2 and tachypnea with RR 25.  Lactic acid is elevated 3.3., s/p Remdesivir x 3 doses and  IV Solumedrol 40 mg bid, Bronchodilators and Z pak due to leukocytosis, PRN Mucinex for cough. Blood culture NGTD Procalcitonin nagative, lactic acid levels trended down to normal, s/p IVF: total of 2.0 of NS bolus, followed by 75 cc/h, decreased to 50 ml/hr. clinically patient remained stable, currently saturating well on room air.  No respiratory distress.  Patient's husband wanted to take her home, patient does not require more remdesivir or steroids.  No need of more antibiotics.  Medically optimized and cleared for discharge home today. # HLD (hyperlipidemia): Patient's not taking medications currently, f/u with PCP # Benign neoplasm of meninges, Continue home Keppra prophylaxis of seizure # Acute metabolic encephalopathy: Likely due to ongoing infection and severe sepsis.  CT head negative for acute intracranial abnormalities.  Patient was ambulatory with assistance.  Patient was back to her baseline, as some problem with ambulation.  Home health PT was arranged on discharge. Body mass index is 27.64 kg/m.  Interventions: Patient was seen by physical therapy, who recommended Home health, which was arranged. On the day of the discharge the patient's vitals were stable, and no other acute medical condition  were reported by patient. the patient was felt safe to be discharge at Home with Home health.  Consultants: None Procedures: None  Discharge Exam: General: Appear in no distress, no Rash; Oral Mucosa Clear, moist. Cardiovascular: S1 and S2 Present, no Murmur, Respiratory: normal respiratory effort, Bilateral Air entry present and mild Crackles, no significant wheezing appreciated Abdomen: Bowel Sound present, Soft and no tenderness, no hernia Extremities: no Pedal edema, no calf tenderness Neurology: alert and oriented to time, place, and person affect appropriate.  Filed Weights   01/29/21 0229  Weight: 73 kg   Vitals:   01/31/21 0832 01/31/21 1225  BP: (!) 141/64 117/64  Pulse: 62 64  Resp: 17 16  Temp: (!) 97.4 F (36.3 C) 98.2 F (36.8 C)  SpO2: 100% 100%    DISCHARGE MEDICATION: Allergies as of 01/31/2021       Reactions   Donepezil Other (See Comments)   Other reaction(s): Other (See Comments) Nightmares        Medication List     TAKE these medications    acetaminophen 325 MG tablet Commonly known as: TYLENOL Take 650 mg by mouth every 4 (four) hours as needed. Pain / increased temp.   albuterol 108 (90 Base) MCG/ACT inhaler Commonly known as: VENTOLIN HFA Inhale 2 puffs into the lungs every 4 (four) hours as needed for wheezing or shortness of breath.   Artificial Tears 1 % ophthalmic solution Generic drug: carboxymethylcellulose Place 1 drop into both eyes daily.   ascorbic acid 500 MG tablet Commonly known as: VITAMIN C Take 1 tablet by mouth daily.   CALTRATE 600+D PO Take 1 tablet by mouth daily.   celecoxib 200 MG capsule Commonly known as: CELEBREX Take 200 mg by mouth daily.   levETIRAcetam 250 MG tablet Commonly known as: KEPPRA 1 tablet daily at bedtime   Lumigan 0.01 % Soln Generic drug: bimatoprost Place 1 drop into both eyes every evening.   nirmatrelvir/ritonavir EUA Tabs Commonly known as: PAXLOVID Take 3 tablets by  mouth 2 (two) times daily for 5 days. Patient GFR is >60. Take nirmatrelvir (150 mg) two tablets twice daily for 5 days and ritonavir (100 mg) one tablet twice daily for 5 days.   Pfizer-BioNT COVID-19 Vac-TriS Susp injection Generic drug: COVID-19 mRNA Vac-TriS (Pfizer) Inject into the muscle.   traMADol 50 MG tablet Commonly known as: ULTRAM Take 1 tablet (50 mg total) by mouth 2 (two) times daily as needed for moderate pain.   Travoprost (BAK Free) 0.004 % Soln ophthalmic solution Commonly known as: TRAVATAN travoprost 0.004 % eye drops   Vitamin D (Ergocalciferol) 1.25 MG (50000 UNIT) Caps capsule Commonly known as: DRISDOL Take 1 capsule (50,000 Units total) by mouth every 7 (seven) days. Start taking on: February 06, 2021   vitamin E 180 MG (400 UNITS) capsule Take 400 Units by mouth daily.       Allergies  Allergen Reactions   Donepezil Other (See Comments)    Other reaction(s): Other (See Comments) Nightmares   Discharge Instructions     Call MD for:   Complete by: As directed    Worsening of shortness of breath   Call MD for:  temperature >100.4   Complete by: As  directed    Diet - low sodium heart healthy   Complete by: As directed    Discharge instructions   Complete by: As directed    Follow-up with PCP in 1 to 2 weeks   Increase activity slowly   Complete by: As directed        The results of significant diagnostics from this hospitalization (including imaging, microbiology, ancillary and laboratory) are listed below for reference.    Significant Diagnostic Studies: DG Chest 2 View  Result Date: 01/29/2021 CLINICAL DATA:  New cough and wheezing. Near syncopal event tonight. EXAM: CHEST - 2 VIEW COMPARISON:  CT chest 11/29/2020 FINDINGS: Central venous catheter with tip over the cavoatrial junction region. No pneumothorax. Shallow inspiration. Heart size and pulmonary vascularity appear normal. Lungs are clear. No pleural effusions. No pneumothorax.  Mediastinal contours appear intact. Calcification of the aorta. IMPRESSION: No active cardiopulmonary disease. Electronically Signed   By: Lucienne Capers M.D.   On: 01/29/2021 03:26   CT Head Wo Contrast  Result Date: 01/29/2021 CLINICAL DATA:  Near syncopal event.  Encephalopathy. EXAM: CT HEAD WITHOUT CONTRAST TECHNIQUE: Contiguous axial images were obtained from the base of the skull through the vertex without intravenous contrast. COMPARISON:  01/04/2017 FINDINGS: Brain: There is no mass, hemorrhage or extra-axial collection. There is generalized atrophy without lobar predilection. Hypodensity of the white matter is most commonly associated with chronic microvascular disease. Vascular: Atherosclerotic calcification of the internal carotid arteries at the skull base. No abnormal hyperdensity of the major intracranial arteries or dural venous sinuses. Skull: Remote left craniotomy Sinuses/Orbits: Sphenoid sinus mucosal thickening. The orbits are normal. IMPRESSION: Generalized atrophy and chronic microvascular ischemia without acute intracranial abnormality. Electronically Signed   By: Ulyses Jarred M.D.   On: 01/29/2021 03:45   CT Angio Chest Pulmonary Embolism (PE) W or WO Contrast  Result Date: 01/30/2021 CLINICAL DATA:  PE suspected, shortness of breath EXAM: CT ANGIOGRAPHY CHEST WITH CONTRAST TECHNIQUE: Multidetector CT imaging of the chest was performed using the standard protocol during bolus administration of intravenous contrast. Multiplanar CT image reconstructions and MIPs were obtained to evaluate the vascular anatomy. CONTRAST:  39mL OMNIPAQUE IOHEXOL 350 MG/ML SOLN COMPARISON:  None. FINDINGS: Cardiovascular: Evaluation of the pulmonary arteries is significantly limited by breath motion artifact; within this limitation, there is no evidence of pulmonary embolism through the proximal segmental pulmonary arterial level. Normal heart size. No pericardial effusion. Right chest port catheter  Mediastinum/Nodes: No enlarged mediastinal, hilar, or axillary lymph nodes. Thyroid gland, trachea, and esophagus demonstrate no significant findings. Lungs/Pleura: Lungs are clear. No pleural effusion or pneumothorax. Upper Abdomen: No acute abnormality. Musculoskeletal: No chest wall abnormality. No acute or significant osseous findings. Review of the MIP images confirms the above findings. IMPRESSION: Evaluation of the pulmonary arteries is significantly limited by breath motion artifact; within this limitation, there is no evidence of pulmonary embolism through the proximal segmental pulmonary arterial level. Electronically Signed   By: Eddie Candle M.D.   On: 01/30/2021 12:48   NM Cardiac Muga Rest  Result Date: 01/22/2021 CLINICAL DATA:  Breast cancer. Evaluate cardiac function in relation to chemotherapy. EXAM: NUCLEAR MEDICINE CARDIAC BLOOD POOL IMAGING (MUGA) TECHNIQUE: Cardiac multi-gated acquisition was performed at rest following intravenous injection of Tc-74m labeled red blood cells. RADIOPHARMACEUTICALS:  21.4 mCi Tc-29m pertechnetate in-vitro labeled red blood cells IV COMPARISON:  August scan 11/27/2020 06/15/2020 FINDINGS: No  focal wall motion abnormality of the left ventricle. Calculated left ventricular ejection fraction equals 52.2 %. Comparable  to 52.9 % on 11/27/2020 IMPRESSION: Left ventricular ejection fraction equals52.2 %. Electronically Signed   By: Suzy Bouchard M.D.   On: 01/22/2021 14:36   DG Hip Unilat With Pelvis 2-3 Views Right  Result Date: 01/29/2021 CLINICAL DATA:  Right hip pain after a fall.  Near syncopal event. EXAM: DG HIP (WITH OR WITHOUT PELVIS) 2-3V RIGHT COMPARISON:  CT abdomen and pelvis 11/29/2020 FINDINGS: Right total hip arthroplasty with non cemented components. Components appear well seated. No evidence of acute fracture or dislocation. Pelvis and visualized left hip appear intact. Degenerative changes in the lower lumbar spine and left hip.  Calcifications in the pelvis likely represent calcified uterine fibroids. IMPRESSION: Right total hip arthroplasty appears well seated. No evidence of acute fracture or dislocation. Electronically Signed   By: Lucienne Capers M.D.   On: 01/29/2021 03:27    Microbiology: Recent Results (from the past 240 hour(s))  Resp Panel by RT-PCR (Flu A&B, Covid) Nasopharyngeal Swab     Status: Abnormal   Collection Time: 01/29/21  4:06 AM   Specimen: Nasopharyngeal Swab; Nasopharyngeal(NP) swabs in vial transport medium  Result Value Ref Range Status   SARS Coronavirus 2 by RT PCR POSITIVE (A) NEGATIVE Final    Comment: RESULT CALLED TO, READ BACK BY AND VERIFIED WITH: LORRIE LEMONS@[id number]  01/29/21 RH (NOTE) SARS-CoV-2 target nucleic acids are DETECTED.  The SARS-CoV-2 RNA is generally detectable in upper respiratory specimens during the acute phase of infection. Positive results are indicative of the presence of the identified virus, but do not rule out bacterial infection or co-infection with other pathogens not detected by the test. Clinical correlation with patient history and other diagnostic information is necessary to determine patient infection status. The expected result is Negative.  Fact Sheet for Patients: EntrepreneurPulse.com.au  Fact Sheet for Healthcare Providers: IncredibleEmployment.be  This test is not yet approved or cleared by the [place] FDA and  has been authorized for detection and/or diagnosis of SARS-CoV-2 by FDA under an Emergency Use Authorization (EUA).  This EUA will remain in effect (meaning this test can be used ) for the duration of  the COVID-19 declaration under Section 564(b)(1) of the Act, 21 U.S.C. section 360bbb-3(b)(1), unless the authorization is terminated or revoked sooner.     Influenza A by PCR NEGATIVE NEGATIVE Final   Influenza B by PCR NEGATIVE NEGATIVE Final    Comment: (NOTE) The Xpert Xpress  SARS-CoV-2/FLU/RSV plus assay is intended as an aid in the diagnosis of influenza from Nasopharyngeal swab specimens and should not be used as a sole basis for treatment. Nasal washings and aspirates are unacceptable for Xpert Xpress SARS-CoV-2/FLU/RSV testing.  Fact Sheet for Patients: EntrepreneurPulse.com.au  Fact Sheet for Healthcare Providers: IncredibleEmployment.be  This test is not yet approved or cleared by the [place] FDA and has been authorized for detection and/or diagnosis of SARS-CoV-2 by FDA under an Emergency Use Authorization (EUA). This EUA will remain in effect (meaning this test can be used) for the duration of the COVID-19 declaration under Section 564(b)(1) of the Act, 21 U.S.C. section 360bbb-3(b)(1), unless the authorization is terminated or revoked.  Performed at [facility], [address] [place], [place] [address]   Culture, blood (x 2)     Status: None (Preliminary result)   Collection Time: 01/29/21  8:57 AM   Specimen: BLOOD  Result Value Ref Range Status   Specimen Description BLOOD BLOOD RIGHT FOREARM  Final   Special Requests   Final    BOTTLES  DRAWN AEROBIC AND ANAEROBIC Blood Culture adequate volume   Culture   Final    NO GROWTH 2 DAYS Performed at [facility], [address] [place], [place] [address]    Report Status PENDING  Incomplete  Culture, blood (x 2)     Status: None (Preliminary result)   Collection Time: 01/29/21  8:58 AM   Specimen: BLOOD  Result Value Ref Range Status   Specimen Description BLOOD BLOOD LEFT FOREARM  Final   Special Requests   Final    BOTTLES DRAWN AEROBIC AND ANAEROBIC Blood Culture adequate volume   Culture   Final    NO GROWTH 2 DAYS Performed at [facility], [place], [place] [address]    Report Status PENDING  Incomplete     Labs: CBC: Recent Labs  Lab 01/29/21 0239 01/30/21 0433 01/31/21 0451  WBC  13.2* 17.1* 13.3*  NEUTROABS 11.6* 14.9* 11.7*  HGB 14.3 13.1 12.4  HCT 45.2 39.7 38.6  MCV 94.4 93.0 94.8  PLT 266 241 384   Basic Metabolic Panel: Recent Labs  Lab 01/29/21 0239 01/30/21 0433 01/31/21 0451  NA 137 139 138  K 3.7 3.7 4.0  CL 99 111 105  CO2 29 22 26   GLUCOSE 134* 129* 114*  BUN 18 16 17   CREATININE 0.83 0.65 0.62  CALCIUM 8.9 7.4* 8.3*  MG  --   --  1.8  PHOS  --   --  3.7   Liver Function Tests: Recent Labs  Lab 01/29/21 0239 01/30/21 0433 01/31/21 0451  AST 22 20 21   ALT 22 14 21   ALKPHOS 56 39 38  BILITOT 0.7 0.3 0.4  PROT 7.3 5.4* 5.9*  ALBUMIN 4.1 2.8* 3.2*   No results for input(s): LIPASE, AMYLASE in the last 168 hours. No results for input(s): AMMONIA in the last 168 hours. Cardiac Enzymes: No results for input(s): CKTOTAL, CKMB, CKMBINDEX, TROPONINI in the last 168 hours. BNP (last 3 results) Recent Labs    01/29/21 0239  BNP 8.2   CBG: Recent Labs  Lab 01/29/21 0240  GLUCAP 132*    Time spent: 35 minutes  Signed:  Val Riles  [facility]  01/31/2021 12:40 PM

## 2021-02-03 LAB — CULTURE, BLOOD (ROUTINE X 2)
Culture: NO GROWTH
Culture: NO GROWTH
Special Requests: ADEQUATE
Special Requests: ADEQUATE

## 2021-02-06 ENCOUNTER — Telehealth: Payer: Self-pay | Admitting: Internal Medicine

## 2021-02-06 ENCOUNTER — Encounter: Payer: Self-pay | Admitting: Internal Medicine

## 2021-02-06 NOTE — Telephone Encounter (Signed)
On 7/13-I called patient's husband to check on the patient; patient clinically doing well. He  will call us to postpone the appointment by 1 to 2 weeks depending upon her clinical status as needed.  For now they will follow-up as planned,  GB

## 2021-02-13 ENCOUNTER — Inpatient Hospital Stay: Payer: Medicare Other

## 2021-02-13 ENCOUNTER — Inpatient Hospital Stay (HOSPITAL_BASED_OUTPATIENT_CLINIC_OR_DEPARTMENT_OTHER): Payer: Medicare Other | Admitting: Internal Medicine

## 2021-02-13 ENCOUNTER — Other Ambulatory Visit: Payer: Self-pay

## 2021-02-13 ENCOUNTER — Encounter: Payer: Self-pay | Admitting: Internal Medicine

## 2021-02-13 ENCOUNTER — Inpatient Hospital Stay: Payer: Medicare Other | Attending: Internal Medicine

## 2021-02-13 DIAGNOSIS — Z171 Estrogen receptor negative status [ER-]: Secondary | ICD-10-CM

## 2021-02-13 DIAGNOSIS — C50812 Malignant neoplasm of overlapping sites of left female breast: Secondary | ICD-10-CM

## 2021-02-13 DIAGNOSIS — M3501 Sicca syndrome with keratoconjunctivitis: Secondary | ICD-10-CM | POA: Insufficient documentation

## 2021-02-13 DIAGNOSIS — F0390 Unspecified dementia without behavioral disturbance: Secondary | ICD-10-CM | POA: Insufficient documentation

## 2021-02-13 DIAGNOSIS — R6889 Other general symptoms and signs: Secondary | ICD-10-CM | POA: Diagnosis not present

## 2021-02-13 DIAGNOSIS — Z5112 Encounter for antineoplastic immunotherapy: Secondary | ICD-10-CM | POA: Diagnosis not present

## 2021-02-13 LAB — CBC WITH DIFFERENTIAL/PLATELET
Abs Immature Granulocytes: 0.03 10*3/uL (ref 0.00–0.07)
Basophils Absolute: 0 10*3/uL (ref 0.0–0.1)
Basophils Relative: 1 %
Eosinophils Absolute: 0.2 10*3/uL (ref 0.0–0.5)
Eosinophils Relative: 3 %
HCT: 43.9 % (ref 36.0–46.0)
Hemoglobin: 13.8 g/dL (ref 12.0–15.0)
Immature Granulocytes: 1 %
Lymphocytes Relative: 18 %
Lymphs Abs: 1.2 10*3/uL (ref 0.7–4.0)
MCH: 29.4 pg (ref 26.0–34.0)
MCHC: 31.4 g/dL (ref 30.0–36.0)
MCV: 93.6 fL (ref 80.0–100.0)
Monocytes Absolute: 0.3 10*3/uL (ref 0.1–1.0)
Monocytes Relative: 5 %
Neutro Abs: 4.7 10*3/uL (ref 1.7–7.7)
Neutrophils Relative %: 72 %
Platelets: 250 10*3/uL (ref 150–400)
RBC: 4.69 MIL/uL (ref 3.87–5.11)
RDW: 13.6 % (ref 11.5–15.5)
WBC: 6.5 10*3/uL (ref 4.0–10.5)
nRBC: 0 % (ref 0.0–0.2)

## 2021-02-13 LAB — COMPREHENSIVE METABOLIC PANEL
ALT: 20 U/L (ref 0–44)
AST: 25 U/L (ref 15–41)
Albumin: 4.1 g/dL (ref 3.5–5.0)
Alkaline Phosphatase: 52 U/L (ref 38–126)
Anion gap: 11 (ref 5–15)
BUN: 19 mg/dL (ref 8–23)
CO2: 24 mmol/L (ref 22–32)
Calcium: 8.5 mg/dL — ABNORMAL LOW (ref 8.9–10.3)
Chloride: 101 mmol/L (ref 98–111)
Creatinine, Ser: 0.77 mg/dL (ref 0.44–1.00)
GFR, Estimated: >60 mL/min (ref >60)
Glucose, Bld: 199 mg/dL — ABNORMAL HIGH (ref 70–99)
Potassium: 3.4 mmol/L — ABNORMAL LOW (ref 3.5–5.1)
Sodium: 136 mmol/L (ref 135–145)
Total Bilirubin: 0.6 mg/dL (ref 0.3–1.2)
Total Protein: 7 g/dL (ref 6.5–8.1)

## 2021-02-13 MED ORDER — HEPARIN SOD (PORK) LOCK FLUSH 100 UNIT/ML IV SOLN
INTRAVENOUS | Status: AC
  Filled 2021-02-13: qty 5

## 2021-02-13 MED ORDER — SODIUM CHLORIDE 0.9 % IV SOLN
Freq: Once | INTRAVENOUS | Status: AC
  Filled 2021-02-13: qty 250

## 2021-02-13 MED ORDER — SODIUM CHLORIDE 0.9 % IV SOLN
420.0000 mg | Freq: Once | INTRAVENOUS | Status: AC
  Administered 2021-02-13: 420 mg via INTRAVENOUS
  Filled 2021-02-13: qty 14

## 2021-02-13 MED ORDER — HEPARIN SOD (PORK) LOCK FLUSH 100 UNIT/ML IV SOLN
500.0000 [IU] | Freq: Once | INTRAVENOUS | Status: AC | PRN
  Administered 2021-02-13: 500 [IU]
  Filled 2021-02-13: qty 5

## 2021-02-13 MED ORDER — TRASTUZUMAB-ANNS CHEMO 150 MG IV SOLR
450.0000 mg | Freq: Once | INTRAVENOUS | Status: AC
  Administered 2021-02-13: 450 mg via INTRAVENOUS
  Filled 2021-02-13: qty 21.43

## 2021-02-13 MED ORDER — SODIUM CHLORIDE 0.9% FLUSH
10.0000 mL | Freq: Once | INTRAVENOUS | Status: AC
  Administered 2021-02-13: 10 mL via INTRAVENOUS
  Filled 2021-02-13: qty 10

## 2021-02-13 MED ORDER — DIPHENHYDRAMINE HCL 50 MG/ML IJ SOLN
12.5000 mg | Freq: Once | INTRAMUSCULAR | Status: AC
  Administered 2021-02-13: 12.5 mg via INTRAVENOUS
  Filled 2021-02-13: qty 1

## 2021-02-13 MED ORDER — ACETAMINOPHEN 325 MG PO TABS
650.0000 mg | ORAL_TABLET | Freq: Once | ORAL | Status: AC
Start: 2021-02-13 — End: 2021-02-13
  Administered 2021-02-13: 650 mg via ORAL
  Filled 2021-02-13: qty 2

## 2021-02-13 NOTE — Progress Notes (Signed)
Recovered from Covid. No new concerns for today.

## 2021-02-13 NOTE — Progress Notes (Signed)
Nutrition Assessment   Reason for Assessment:   Patient with pre-diabetes and dementia.     ASSESSMENT:  80 year old female with metastatic breast cancer.  Patient with dementia, chronic back pain, HLD, brain surgery, s/p lumpectomy.  Patient followed by Dr B and receiving kanjinti and perjeta.    Met with husband after MD visit as patient with dementia.  Noted recent hospitalization for Covid.  Husband reports patient with good appetite and has been for several years.  Reports that patient does not cook/prepare meals.  He primarily goes out and picks up meals at [employer].  Does have caregiver that helps prepare meals.  Daily intake is typically cereal, with 1% milk, small amount of juice, berries for breakfast.  Lunch is peanut butter and jelly on white bread with yogurt. Dinner is special from [employer] (likes seafood) or cheese burger with fries from [employer]. Patient enjoys bowl of ice cream each evening at 8:30pm.  Patient drinks water primarily, black coffee, 1/2 glass of wine with seltzer with dinner.  Activity is limited with degenerative disc disease and balance issues.   Husband denies any nutrition impact symptoms   Medications: reviewed   Labs: glucose 199 K 3.4   Anthropometrics:   Height: 64 inches Weight: 174 lb 3.2 oz UBW: 170-175 lb BMI: 29  Overall stable weight   NUTRITION DIAGNOSIS: Food and nutrition related knowledge deficit related to elevated blood glucose as evidenced by referral to nutrition   INTERVENTION:  Concerned with placing diet restrictions on patient with dementia, metastatic breast cancer receiving treatment to control blood glucose and discussed RD's concerns with husband.   Discussed options of choosing lower sugar food items (carb smart ice cream, no sugar added, sugar free jelly, lower sugar cereal, etc) if patient agreeable and accepting of these products. Husband agreeable to trying these items.   Encouraged sugar free  beverages which patient is primarily doing.  Discussed plate method and handout provided.  Contact information provided   MONITORING, EVALUATION, GOAL: weight trends, intake, blood glucose   Next Visit: Wednesday, August 10th with husband  [name] B. Zenia Resides, [place], [place] Registered Dietitian [phone] (mobile)

## 2021-02-13 NOTE — Progress Notes (Signed)
[facility] OFFICE PROGRESS NOTE  Patient Care Team: [name], MD as PCP - General (Internal Medicine) Bary Castilla, [name], MD (General Surgery) Requested, Self Cammie Sickle, MD as Consulting Physician (Internal Medicine)  Cancer Staging No matching staging information was found for the patient.   Oncology History Overview Note  # June 2016- LEFT BREAST CA;  [name] ; [name] ; [name]; Her 2 Neu POS; [place] from July OF 2016; [name]; adjuvant Herceptin [ Finished July 2017]; AUG 2017- Neratinib x5 days; DISCON sec to diarrhea  # MID OCT 2018- Right breast mass-Bx- ER/PR-NEG; Her 2 NEU POSITIVE 1~2.5cm;  [?NEW primary]  # MID-OCT 2018-METASTATIC RECURRENT-oh sternal mass; Left Ax LN [Bx]/periportal LN  # OCT 25th 2018- TAXOL-HERCEPTIN-PERJETA; Jan 2019- CT PR; continue HP only; on HOLD since NOV 2019--sec to drop in EF/Sieziues  # July 15th 2020- RE-STARTED HP   -------------------------------------------------------------------  # MUGA scan- July 28th 2017- 67%.  December 2019-EF 53%; January 2020 EF-52%  # chronic gait/balance issues  # Seizures/petit-mal/ Dr.Shah-Keppra [name] [id number]]  # June 2017- left breast Bx- fat necrosis [[name]  # july 2017-  BRCA 1& 2- NEG.   MOLECULAR TESTING- F ONE- TPS- 0%;  ERB2 amplification; PI3K/RET amplification Others**  # PALLIATIVE CARE: P  --------------------------------------------------    DIAGNOSIS: [ OCT 1027]- REC/MET- BREAST CA ER/PR-NEG; her 2 POS  STAGE: 4  ;GOALS: Palliative  CURRENT/MOST RECENT THERAPY- Herceptin-Perjeta [C]    Carcinoma of overlapping sites of left breast in female, estrogen receptor negative ([place])     INTERVAL HISTORY: Patient a poor historian given dementia.  Patient is accompanied by her husband today.   [name] 80 y.o.  female pleasant patient above history of metastatic breast cancer on  Herceptin plus Perjeta-is here for follow-up   In the interim patient was admitted to hospital for sepsis/COVID-pneumonia.  She has recovered well.  No swelling in the legs.  No nausea no vomiting.  No unusual weight gain or weight loss.  Review of Systems  Unable to perform ROS: Dementia   PAST MEDICAL HISTORY :  Past Medical History:  Diagnosis Date   Arthritis    Brain tumor ([place]) 1995   meningeoma   Breast cancer ([place]) 2018   left breast; surgery 01/11/15 with Dr. Bary Castilla; path with invasive mammary and DCIS   Breast cancer of upper-inner quadrant of left female breast ([place]) 12/15/14   Completed radiation end of December and finished chemotherapy 2 weeks ago, Left breast invasive mammary carcinoma, T1cN52mc (1.5 cm); Grade 3, [name], [name], [name], .   Cataract    bilat    DDD (degenerative disc disease), lumbar    Lumbar, previously evaluated by Dr. HEarnestine Leys  Dementia ([facility]    Fibrocystic breast disease    prior biopsy   Glaucoma    Hard of hearing    wears hearing aides bilat    Hearing loss    History of cancer chemotherapy    History of radiation therapy    Hyperlipidemia, unspecified    Imbalance    Memory impairment    seen by Dr SManuella Ghazi possible post crainiotomy from radiation   Meningioma ([facility]    Left cavernous sinus meningioma, [facility].   Numbness and tingling    right hand    Osteoarthritis    Osteoporosis,  post-menopausal    Personal history of chemotherapy    Personal history of radiation therapy    Shoulder pain, right    Wears glasses     PAST SURGICAL HISTORY :   Past Surgical History:  Procedure Laterality Date   [place]   left frontal/temporal   BREAST BIOPSY Left 12/15/14   confirmed DCIS   BREAST BIOPSY Left 01/08/2015   Procedure: BREAST BIOPSY WITH NEEDLE LOCALIZATION;  Surgeon: [name], MD;  Location:  [facility] ORS;  Service: General;  Laterality: Left;   BREAST EXCISIONAL BIOPSY Left 1997   BREAST LUMPECTOMY Left 01/08/2015   Procedure: LUMPECTOMY;  Surgeon: [name], MD;  Location: [facility] ORS;  Service: General;  Laterality: Left;   COLONOSCOPY  2010   Dr. [name]   ORIF ANKLE FRACTURE Right 08/05/2017   Procedure: OPEN REDUCTION INTERNAL FIXATION (ORIF) ANKLE FRACTURE;  Surgeon: Earnestine Leys, MD;  Location: [facility] ORS;  Service: Orthopedics;  Laterality: Right;   PORTACATH PLACEMENT Right 01/16/2015   Procedure: INSERTION PORT-A-CATH;  Surgeon: [name], MD;  Location: [facility] ORS;  Service: General;  Laterality: Right;   SENTINEL NODE BIOPSY Left 01/16/2015   Procedure: SENTINEL NODE BIOPSY;  Surgeon: [name], MD;  Location: [facility] ORS;  Service: General;  Laterality: Left;   TOTAL HIP ARTHROPLASTY Right 09/04/2015   Procedure: RIGHT TOTAL HIP ARTHROPLASTY ANTERIOR APPROACH;  Surgeon: [name], MD;  Location: [facility] ORS;  Service: Orthopedics;  Laterality: Right;    FAMILY HISTORY :   Family History  Problem Relation Age of Onset   Breast cancer Sister 34   Addison's disease Mother    Hyperthyroidism Mother    Osteoarthritis Mother    Stroke Father    Heart disease Father    High blood pressure Father     SOCIAL HISTORY:   Social History   Tobacco Use   Smoking status: Former    Packs/day: 0.25    Years: 5.00    Pack years: 1.25    Types: Cigarettes    Quit date: 07/28/1972    Years since quitting: 48.5   Smokeless tobacco: Never  Vaping Use   Vaping Use: Never used  Substance Use Topics   Alcohol use: Yes    Alcohol/week: 1.0 - 2.0 standard drink    Types: 1 - 2 Glasses of wine per week    Comment: 1 Glass Wine / Night   Drug use: No    ALLERGIES:  is allergic to donepezil.  MEDICATIONS:  Current Outpatient Medications  Medication Sig Dispense Refill   acetaminophen (TYLENOL) 325 MG tablet Take 650 mg by mouth every 4 (four) hours as needed. Pain /  increased temp.     ascorbic acid (VITAMIN C) 500 MG tablet Take 1 tablet by mouth daily.     celecoxib (CELEBREX) 200 MG capsule Take 200 mg by mouth daily.      levETIRAcetam (KEPPRA) 250 MG tablet 1 tablet daily at bedtime  5   Travoprost, BAK Free, (TRAVATAN) 0.004 % SOLN ophthalmic solution travoprost 0.004 % eye drops     vitamin E 400 UNIT capsule Take 400 Units by mouth daily.      albuterol (VENTOLIN HFA) 108 (90 Base) MCG/ACT inhaler Inhale 2 puffs into the lungs every 4 (four) hours as needed for wheezing or shortness of breath. (Patient not taking: Reported on 02/13/2021) 18 g 0   ARTIFICIAL TEARS 1 % ophthalmic solution Place 1  drop into both eyes daily.     Calcium Carbonate-Vitamin D (CALTRATE 600+D PO) Take 1 tablet by mouth daily. (Patient not taking: Reported on 02/13/2021)     COVID-19 mRNA Vac-TriS, Pfizer, (PFIZER-BIONT COVID-19 VAC-TRIS) SUSP injection Inject into the muscle. 0.3 mL 0   LUMIGAN 0.01 % SOLN Place 1 drop into both eyes every evening.     traMADol (ULTRAM) 50 MG tablet Take 1 tablet (50 mg total) by mouth 2 (two) times daily as needed for moderate pain. (Patient not taking: Reported on 02/13/2021) 60 tablet 0   No current facility-administered medications for this visit.   Facility-Administered Medications Ordered in Other Visits  Medication Dose Route Frequency Provider Last Rate Last Admin   heparin lock flush 100 unit/mL  500 Units Intracatheter Once PRN Cammie Sickle, MD       sodium chloride flush (NS) 0.9 % injection 10 mL  10 mL Intravenous PRN Cammie Sickle, MD   10 mL at 07/07/18 0830    PHYSICAL EXAMINATION: ECOG PERFORMANCE STATUS: 2 - Symptomatic, <50% confined to bed  BP 130/78 (BP Location: Right Arm, Patient Position: Sitting)   Pulse 79   Temp 97.8 F (36.6 C) (Tympanic)   Resp 17   Wt 174 lb 3.2 oz (79 kg)   BMI 29.90 kg/m   Filed Weights   02/13/21 0906  Weight: 174 lb 3.2 oz (79 kg)     Physical  Exam Constitutional:      Comments: Patient is accompanied by husband.  In a wheelchair.  HENT:     Head: Normocephalic and atraumatic.     Mouth/Throat:     Pharynx: No oropharyngeal exudate.  Eyes:     Pupils: Pupils are equal, round, and reactive to light.  Cardiovascular:     Rate and Rhythm: Normal rate and regular rhythm.  Pulmonary:     Effort: Pulmonary effort is normal. No respiratory distress.     Breath sounds: Normal breath sounds. No wheezing.  Abdominal:     General: Bowel sounds are normal. There is no distension.     Palpations: Abdomen is soft. There is no mass.     Tenderness: no abdominal tenderness There is no guarding or rebound.  Musculoskeletal:        General: No tenderness. Normal range of motion.     Cervical back: Normal range of motion and neck supple.  Skin:    General: Skin is warm.  Neurological:     Mental Status: She is alert and oriented to person, place, and time.  Psychiatric:        Mood and Affect: Affect normal.    LABORATORY DATA:  I have reviewed the data as listed    Component Value Date/Time   NA 136 02/13/2021 0846   K 3.4 (L) 02/13/2021 0846   CL 101 02/13/2021 0846   CO2 24 02/13/2021 0846   GLUCOSE 199 (H) 02/13/2021 0846   BUN 19 02/13/2021 0846   CREATININE 0.77 02/13/2021 0846   CALCIUM 8.5 (L) 02/13/2021 0846   PROT 7.0 02/13/2021 0846   ALBUMIN 4.1 02/13/2021 0846   AST 25 02/13/2021 0846   ALT 20 02/13/2021 0846   ALKPHOS 52 02/13/2021 0846   BILITOT 0.6 02/13/2021 0846   GFRNONAA >60 02/13/2021 0846   GFRAA >60 04/16/2020 0814    No results found for: SPEP, UPEP  Lab Results  Component Value Date   WBC 6.5 02/13/2021   NEUTROABS 4.7 02/13/2021   HGB  13.8 02/13/2021   HCT 43.9 02/13/2021   MCV 93.6 02/13/2021   PLT 250 02/13/2021      Chemistry      Component Value Date/Time   NA 136 02/13/2021 0846   K 3.4 (L) 02/13/2021 0846   CL 101 02/13/2021 0846   CO2 24 02/13/2021 0846   BUN 19 02/13/2021  0846   CREATININE 0.77 02/13/2021 0846      Component Value Date/Time   CALCIUM 8.5 (L) 02/13/2021 0846   ALKPHOS 52 02/13/2021 0846   AST 25 02/13/2021 0846   ALT 20 02/13/2021 0846   BILITOT 0.6 02/13/2021 0846       RADIOGRAPHIC STUDIES: I have personally reviewed the radiological images as listed and agreed with the findings in the report. No results found.   ASSESSMENT & PLAN:  Carcinoma of overlapping sites of left breast in female, estrogen receptor negative ([place]) #Metastatic breast cancer ER [name] HER-2/neu positive. MAY 2022-  CT scan chest and pelvis NED-  STABLE;   sclerosis of the manubrium.  STABLE.   # Currently on Herceptin-perjeta. Labs today reviewed;  acceptable for treatment today.   # Drop in ejection fraction-January 09, 2020 MUGA scan ejection fraction 47%; AUG 2021- MUGA scan- 61%. NOV 2021- EF-55% [2 months break- DEC-FEB, 2022]; July 2022- STABLE at 52.5%   # dry eyes/ keratitis sicca- *herceptin-perjeta; not sure if casutive factor.  Continue moisturizing eyedrops.   STABLE.   # Chronic back and hip pain/ gait instability--STABLE.  On asper patch/ tramadol prn.    # Elevated BG-post breakfast in the range of 1 80-200; STABLE  HbA1c-5.6; await nutrition eval today.   #COVID infection/sepsis-July 2022-resolved no current symptoms noted.  DISPOSITION:  #Herceptin-Perjeta today # Follow up-in 3 weeks MD; labs-cbc/cmp; Herceptin-Perjeta;-Dr.B   No orders of the defined types were placed in this encounter.  All questions were answered. The patient knows to call the clinic with any problems, questions or concerns.      Cammie Sickle, MD 02/13/2021 9:33 PM

## 2021-02-13 NOTE — Assessment & Plan Note (Addendum)
#  Metastatic breast cancer ER [name] HER-2/neu positive. MAY 2022-  CT scan chest and pelvis NED-  STABLE;   sclerosis of the manubrium.  STABLE.   # Currently on Herceptin-perjeta. Labs today reviewed;  acceptable for treatment today.   # Drop in ejection fraction-January 09, 2020 MUGA scan ejection fraction 47%; AUG 2021- MUGA scan- 61%. NOV 2021- EF-55% [2 months break- DEC-FEB, 2022]; July 2022- STABLE at 52.5%   # dry eyes/ keratitis sicca- *herceptin-perjeta; not sure if casutive factor.  Continue moisturizing eyedrops.   STABLE.   # Chronic back and hip pain/ gait instability--STABLE.  On asper patch/ tramadol prn.    # Elevated BG-post breakfast in the range of 1 80-200; STABLE  HbA1c-5.6; await nutrition eval today.   #COVID infection/sepsis-July 2022-resolved no current symptoms noted.  DISPOSITION:  #Herceptin-Perjeta today # Follow up-in 3 weeks MD; labs-cbc/cmp; Herceptin-Perjeta;-Dr.B

## 2021-02-13 NOTE — Patient Instructions (Signed)
[facility]COL[facility]  Discharge Instructions: Thank you for choosing [facility] to provide your oncology and hematology care.  If you have a lab appointment with the [facility], please go directly to the [facility] and check in at the registration area.  Wear comfortable clothing and clothing appropriate for easy access to any Portacath or PICC line.   We strive to give you quality time with your provider. You may need to reschedule your appointment if you arrive late (15 or more minutes).  Arriving late affects you and other patients whose appointments are after yours.  Also, if you miss three or more appointments without notifying the office, you may be dismissed from the clinic at the provider's discretion.      For prescription refill requests, have your pharmacy contact our office and allow 72 hours for refills to be completed.    Today you received the following chemotherapy and/or immunotherapy agents Kanjinti and Perjeta      To help prevent nausea and vomiting after your treatment, we encourage you to take your nausea medication as directed.  BELOW ARE SYMPTOMS THAT SHOULD BE REPORTED IMMEDIATELY: *FEVER GREATER THAN 100.4 F (38 C) OR HIGHER *CHILLS OR SWEATING *NAUSEA AND VOMITING THAT IS NOT CONTROLLED WITH YOUR NAUSEA MEDICATION *UNUSUAL SHORTNESS OF BREATH *UNUSUAL BRUISING OR BLEEDING *URINARY PROBLEMS (pain or burning when urinating, or frequent urination) *BOWEL PROBLEMS (unusual diarrhea, constipation, pain near the anus) TENDERNESS IN MOUTH AND THROAT WITH OR WITHOUT PRESENCE OF ULCERS (sore throat, sores in mouth, or a toothache) UNUSUAL RASH, SWELLING OR PAIN  UNUSUAL VAGINAL DISCHARGE OR ITCHING   Items with * indicate a potential emergency and should be followed up as soon as possible or go to the Emergency Department if any problems should occur.  Please show the CHEMOTHERAPY ALERT CARD or IMMUNOTHERAPY ALERT CARD at  check-in to the Emergency Department and triage nurse.  Should you have questions after your visit or need to cancel or reschedule your appointment, please contact [facility]  [phone] and follow the prompts.  Office hours are 8:00 a.m. to 4:30 p.m. Monday - Friday. Please note that voicemails left after 4:00 p.m. may not be returned until the following business day.  We are closed weekends and major holidays. You have access to a nurse at all times for urgent questions. Please call the main number to the clinic [phone] and follow the prompts.  For any non-urgent questions, you may also contact your provider using MyChart. We now offer e-Visits for anyone 29 and older to request care online for non-urgent symptoms. For details visit [email or web address].   Also download the MyChart app! Go to the app store, search "MyChart", open the app, select [facility], and log in with your MyChart username and password.  Due to Covid, a mask is required upon entering the hospital/clinic. If you do not have a mask, one will be given to you upon arrival. For doctor visits, patients may have 1 support person aged 35 or older with them. For treatment visits, patients cannot have anyone with them due to current Covid guidelines and our immunocompromised population.

## 2021-02-18 ENCOUNTER — Ambulatory Visit
Admission: RE | Admit: 2021-02-18 | Discharge: 2021-02-18 | Disposition: A | Discharge status: Home / Self Care | Payer: Medicare Other | Source: Ambulatory Visit | Attending: General Surgery | Admitting: General Surgery

## 2021-02-18 ENCOUNTER — Encounter: Payer: Self-pay | Admitting: Internal Medicine

## 2021-02-18 ENCOUNTER — Other Ambulatory Visit: Payer: Self-pay

## 2021-02-18 DIAGNOSIS — Z1231 Encounter for screening mammogram for malignant neoplasm of breast: Secondary | ICD-10-CM | POA: Insufficient documentation

## 2021-02-18 DIAGNOSIS — Z853 Personal history of malignant neoplasm of breast: Secondary | ICD-10-CM | POA: Insufficient documentation

## 2021-02-20 ENCOUNTER — Other Ambulatory Visit: Payer: Self-pay | Admitting: General Surgery

## 2021-02-20 DIAGNOSIS — R921 Mammographic calcification found on diagnostic imaging of breast: Secondary | ICD-10-CM

## 2021-02-20 DIAGNOSIS — R928 Other abnormal and inconclusive findings on diagnostic imaging of breast: Secondary | ICD-10-CM

## 2021-02-26 ENCOUNTER — Telehealth: Payer: Self-pay | Admitting: *Deleted

## 2021-02-26 NOTE — Telephone Encounter (Signed)
Per Selinda Eon w/ Byrnett's office, pt does not want AV / mammo appt - she will do a 6 month fu

## 2021-03-06 ENCOUNTER — Other Ambulatory Visit: Payer: Self-pay

## 2021-03-06 ENCOUNTER — Inpatient Hospital Stay: Payer: Medicare Other

## 2021-03-06 ENCOUNTER — Inpatient Hospital Stay (HOSPITAL_BASED_OUTPATIENT_CLINIC_OR_DEPARTMENT_OTHER): Payer: Medicare Other | Admitting: Internal Medicine

## 2021-03-06 ENCOUNTER — Encounter: Payer: Self-pay | Admitting: Internal Medicine

## 2021-03-06 ENCOUNTER — Inpatient Hospital Stay: Payer: Medicare Other | Attending: Internal Medicine

## 2021-03-06 DIAGNOSIS — Z5112 Encounter for antineoplastic immunotherapy: Secondary | ICD-10-CM | POA: Diagnosis not present

## 2021-03-06 DIAGNOSIS — H5789 Other specified disorders of eye and adnexa: Secondary | ICD-10-CM | POA: Insufficient documentation

## 2021-03-06 DIAGNOSIS — C50812 Malignant neoplasm of overlapping sites of left female breast: Secondary | ICD-10-CM

## 2021-03-06 DIAGNOSIS — F0390 Unspecified dementia without behavioral disturbance: Secondary | ICD-10-CM | POA: Insufficient documentation

## 2021-03-06 DIAGNOSIS — Z171 Estrogen receptor negative status [ER-]: Secondary | ICD-10-CM | POA: Diagnosis not present

## 2021-03-06 DIAGNOSIS — C7951 Secondary malignant neoplasm of bone: Secondary | ICD-10-CM | POA: Insufficient documentation

## 2021-03-06 LAB — COMPREHENSIVE METABOLIC PANEL
ALT: 19 U/L (ref 0–44)
AST: 26 U/L (ref 15–41)
Albumin: 3.9 g/dL (ref 3.5–5.0)
Alkaline Phosphatase: 48 U/L (ref 38–126)
Anion gap: 9 (ref 5–15)
BUN: 15 mg/dL (ref 8–23)
CO2: 26 mmol/L (ref 22–32)
Calcium: 8.2 mg/dL — ABNORMAL LOW (ref 8.9–10.3)
Chloride: 99 mmol/L (ref 98–111)
Creatinine, Ser: 0.86 mg/dL (ref 0.44–1.00)
GFR, Estimated: >60 mL/min (ref >60)
Glucose, Bld: 188 mg/dL — ABNORMAL HIGH (ref 70–99)
Potassium: 3.6 mmol/L (ref 3.5–5.1)
Sodium: 134 mmol/L — ABNORMAL LOW (ref 135–145)
Total Bilirubin: 0.7 mg/dL (ref 0.3–1.2)
Total Protein: 6.5 g/dL (ref 6.5–8.1)

## 2021-03-06 LAB — CBC WITH DIFFERENTIAL/PLATELET
Abs Immature Granulocytes: 0.03 10*3/uL (ref 0.00–0.07)
Basophils Absolute: 0 10*3/uL (ref 0.0–0.1)
Basophils Relative: 1 %
Eosinophils Absolute: 0.1 10*3/uL (ref 0.0–0.5)
Eosinophils Relative: 2 %
HCT: 42.8 % (ref 36.0–46.0)
Hemoglobin: 13.6 g/dL (ref 12.0–15.0)
Immature Granulocytes: 1 %
Lymphocytes Relative: 18 %
Lymphs Abs: 1.1 10*3/uL (ref 0.7–4.0)
MCH: 30 pg (ref 26.0–34.0)
MCHC: 31.8 g/dL (ref 30.0–36.0)
MCV: 94.5 fL (ref 80.0–100.0)
Monocytes Absolute: 0.4 10*3/uL (ref 0.1–1.0)
Monocytes Relative: 6 %
Neutro Abs: 4.6 10*3/uL (ref 1.7–7.7)
Neutrophils Relative %: 72 %
Platelets: 281 10*3/uL (ref 150–400)
RBC: 4.53 MIL/uL (ref 3.87–5.11)
RDW: 13.7 % (ref 11.5–15.5)
WBC: 6.3 10*3/uL (ref 4.0–10.5)
nRBC: 0 % (ref 0.0–0.2)

## 2021-03-06 MED ORDER — SODIUM CHLORIDE 0.9 % IV SOLN
420.0000 mg | Freq: Once | INTRAVENOUS | Status: AC
  Administered 2021-03-06: 420 mg via INTRAVENOUS
  Filled 2021-03-06: qty 14

## 2021-03-06 MED ORDER — HEPARIN SOD (PORK) LOCK FLUSH 100 UNIT/ML IV SOLN
INTRAVENOUS | Status: AC
  Filled 2021-03-06: qty 5

## 2021-03-06 MED ORDER — TRASTUZUMAB-ANNS CHEMO 150 MG IV SOLR
450.0000 mg | Freq: Once | INTRAVENOUS | Status: AC
  Administered 2021-03-06: 450 mg via INTRAVENOUS
  Filled 2021-03-06: qty 21.43

## 2021-03-06 MED ORDER — DIPHENHYDRAMINE HCL 50 MG/ML IJ SOLN
12.5000 mg | Freq: Once | INTRAMUSCULAR | Status: AC
  Administered 2021-03-06: 12.5 mg via INTRAVENOUS
  Filled 2021-03-06: qty 1

## 2021-03-06 MED ORDER — SODIUM CHLORIDE 0.9% FLUSH
10.0000 mL | INTRAVENOUS | Status: DC | PRN
  Filled 2021-03-06: qty 10

## 2021-03-06 MED ORDER — SODIUM CHLORIDE 0.9% FLUSH
10.0000 mL | Freq: Once | INTRAVENOUS | Status: AC
  Administered 2021-03-06: 10 mL via INTRAVENOUS
  Filled 2021-03-06: qty 10

## 2021-03-06 MED ORDER — SODIUM CHLORIDE 0.9 % IV SOLN
Freq: Once | INTRAVENOUS | Status: AC
  Filled 2021-03-06: qty 250

## 2021-03-06 MED ORDER — HEPARIN SOD (PORK) LOCK FLUSH 100 UNIT/ML IV SOLN
500.0000 [IU] | Freq: Once | INTRAVENOUS | Status: AC | PRN
  Administered 2021-03-06: 500 [IU]
  Filled 2021-03-06: qty 5

## 2021-03-06 MED ORDER — ACETAMINOPHEN 325 MG PO TABS
650.0000 mg | ORAL_TABLET | Freq: Once | ORAL | Status: AC
  Administered 2021-03-06: 650 mg via ORAL
  Filled 2021-03-06: qty 2

## 2021-03-06 NOTE — Assessment & Plan Note (Addendum)
#  Metastatic breast cancer ER [name] HER-2/neu positive. MAY 2022-  CT scan chest and pelvis NED-  STABLE;   sclerosis of the manubrium.  STABLE.   # Currently on Herceptin-perjeta. Labs today reviewed;  acceptable for treatment today.   # Drop in ejection fraction-January 09, 2020 MUGA scan ejection fraction 47%; AUG 2021- MUGA scan- 61%. NOV 2021- EF-55% [2 months break- DEC-FEB, 2022]; July 2022- STABLE at 52.5%   # dry eyes/ keratitis sicca- *herceptin-perjeta; not sure if casutive factor.  Continue moisturizing eyedrops. STABLE.   # Chronic back and hip pain/ gait instability--STABLE.  On asper patch/ tramadol prn.    # Elevated BG-post breakfast in the range of 188; STABLE  HbA1c-5.6; await nutrition eval today.  Discussed importance of obesity/metabolic syndrome causing elevated blood sugars.  Strongly recommend eating more green leafy vegetables and cutting down processed food/ carbohydrates.  Instead increasing whole grains / protein in the diet.   DISPOSITION:  #Herceptin-Perjeta today # Follow up-in 3 weeks MD; labs-cbc/cmp; Herceptin-Perjeta;-Dr.B

## 2021-03-06 NOTE — Progress Notes (Signed)
[place] OFFICE PROGRESS NOTE  Patient Care Team: [name], MD as PCP - General (Internal Medicine) Bary Castilla, [name], MD (General Surgery) Requested, Self Cammie Sickle, MD as Consulting Physician (Internal Medicine)  Cancer Staging No matching staging information was found for the patient.   Oncology History Overview Note  # June 2016- LEFT BREAST CA;  [name] ; [name] ; [name]; Her 2 Neu POS; [place] from July OF 2016; [name]; adjuvant Herceptin [ Finished July 2017]; AUG 2017- Neratinib x5 days; DISCON sec to diarrhea  # MID OCT 2018- Right breast mass-Bx- ER/PR-NEG; Her 2 NEU POSITIVE 1~2.5cm;  [?NEW primary]  # MID-OCT 2018-METASTATIC RECURRENT-oh sternal mass; Left Ax LN [Bx]/periportal LN  # OCT 25th 2018- TAXOL-HERCEPTIN-PERJETA; Jan 2019- CT PR; continue HP only; on HOLD since NOV 2019--sec to drop in EF/Sieziues  # July 15th 2020- RE-STARTED HP   -------------------------------------------------------------------  # MUGA scan- July 28th 2017- 67%.  December 2019-EF 53%; January 2020 EF-52%  # chronic gait/balance issues  # Seizures/petit-mal/ Dr.Shah-Keppra [name] [id number]]  # June 2017- left breast Bx- fat necrosis [[name]  # july 2017-  BRCA 1& 2- NEG.   MOLECULAR TESTING- F ONE- TPS- 0%;  ERB2 amplification; PI3K/RET amplification Others**  # PALLIATIVE CARE: P  --------------------------------------------------    DIAGNOSIS: [ OCT 6433]- REC/MET- BREAST CA ER/PR-NEG; her 2 POS  STAGE: 4  ;GOALS: Palliative  CURRENT/MOST RECENT THERAPY- Herceptin-Perjeta [C]    Carcinoma of overlapping sites of left breast in female, estrogen receptor negative ([place])     INTERVAL HISTORY: Patient a poor historian given dementia.  Patient is accompanied by her husband today.   [name] 80 y.o.  female pleasant patient above history of metastatic breast cancer on  Herceptin plus Perjeta-is here for follow-up.  Patient is recovering well overall from the recent COVID pneumonia/infection.  As per the husband patient does have fatigue.  However this is not getting worse.  No headaches.   Review of Systems  Unable to perform ROS: Dementia   PAST MEDICAL HISTORY :  Past Medical History:  Diagnosis Date  . Arthritis   . 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 Breast cancer ([place]) 2018   left breast; surgery 01/11/15 with Dr. Bary Castilla; path with invasive mammary and DCIS  . Breast cancer of upper-inner quadrant of left female breast ([place]) 12/15/14   Completed radiation end of December and finished chemotherapy 2 weeks ago, Left breast invasive mammary carcinoma, T1cN76mc (1.5 cm); Grade 3, [name], [name], [name], .  .Marland KitchenCataract    bilat   . DDD (degenerative disc disease), lumbar    Lumbar, previously evaluated by Dr. HEarnestine Leys . Dementia ([place]   . Fibrocystic breast disease    prior biopsy  . Glaucoma   . 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 Hearing loss   . History of cancer chemotherapy   . History of radiation therapy   . Hyperlipidemia, unspecified   . Imbalance   . Memory impairment    seen by Dr SManuella Ghazi possible post crainiotomy from radiation  . 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  . Numbness and tingling    right hand   . Osteoarthritis   . Osteoporosis, post-menopausal   . Personal  history of chemotherapy   . Personal history of radiation therapy   . Shoulder pain, right   . Wears glasses     PAST SURGICAL HISTORY :   Past Surgical History:  Procedure Laterality Date  . APPENDECTOMY  1950  . BRAIN SURGERY  1995   left frontal/temporal  . BREAST BIOPSY Left 12/15/14   confirmed DCIS  . BREAST BIOPSY Left 01/08/2015   Procedure: BREAST BIOPSY WITH NEEDLE LOCALIZATION;  Surgeon: [name],  MD;  Location: [facility] ORS;  Service: General;  Laterality: Left;  . BREAST EXCISIONAL BIOPSY Left 1997  . BREAST LUMPECTOMY Left 01/08/2015   Procedure: LUMPECTOMY;  Surgeon: [name], MD;  Location: [facility] OR[facility];  Service: General;  Laterality: Left;  . COLONOSCOPY  2010   Dr. [name]  . ORIF ANKLE FRACTURE Right 08/05/2017   Procedure: OPEN REDUCTION INTERNAL FIXATION (ORIF) ANKLE FRACTURE;  Surgeon: Earnestine Leys, MD;  Location: [facility] OR[facility];  Service: Orthopedics;  Laterality: Right;  . PORTACATH PLACEMENT Right 01/16/2015   Procedure: INSERTION PORT-A-CATH;  Surgeon: [name], MD;  Location: [facility] ORS;  Service: General;  Laterality: Right;  . SENTINEL NODE BIOPSY Left 01/16/2015   Procedure: SENTINEL NODE BIOPSY;  Surgeon: [name], MD;  Location: [facility] ORS;  Service: General;  Laterality: Left;  . TOTAL HIP ARTHROPLASTY Right 09/04/2015   Procedure: RIGHT TOTAL HIP ARTHROPLASTY ANTERIOR APPROACH;  Surgeon: [name], MD;  Location: [facility];  Service: Orthopedics;  Laterality: Right;    FAMILY HISTORY :   Family History  Problem Relation Age of Onset  . Breast cancer Sister 18  . Addison's disease Mother   . Hyperthyroidism Mother   . Osteoarthritis Mother   . Stroke Father   . Heart disease Father   . High blood pressure Father     SOCIAL HISTORY:   Social History   Tobacco Use  . Smoking status: Former    Packs/day: 0.25    Years: 5.00    Pack years: 1.25    Types: Cigarettes    Quit date: 07/28/1972    Years since quitting: 48.6  . Smokeless tobacco: Never  Vaping Use  . Vaping Use: Never used  Substance Use Topics  . Alcohol use: Yes    Alcohol/week: 1.0 - 2.0 standard drink    Types: 1 - 2 Glasses of wine per week    Comment: 1 Glass Wine / Night  . Drug use: No    ALLERGIES:  is allergic to donepezil.  MEDICATIONS:  Current Outpatient Medications  Medication Sig Dispense Refill  . acetaminophen (TYLENOL) 325 MG tablet Take 650 mg by mouth  every 4 (four) hours as needed. Pain / increased temp.    Marland Kitchen albuterol (VENTOLIN HFA) 108 (90 Base) MCG/ACT inhaler Inhale 2 puffs into the lungs every 4 (four) hours as needed for wheezing or shortness of breath. 18 g 0  . ARTIFICIAL TEARS 1 % ophthalmic solution Place 1 drop into both eyes daily.    Marland Kitchen ascorbic acid (VITAMIN C) 500 MG tablet Take 1 tablet by mouth daily.    . Calcium Carbonate-Vitamin D (CALTRATE 600+D PO) Take 1 tablet by mouth daily.    . celecoxib (CELEBREX) 200 MG capsule Take 200 mg by mouth daily.     Marland Kitchen levETIRAcetam (KEPPRA) 250 MG tablet 1 tablet daily at bedtime  5  . Travoprost, BAK Free, (TRAVATAN) 0.004 % SOLN ophthalmic solution travoprost 0.004 % eye drops    . vitamin E  400 UNIT capsule Take 400 Units by mouth daily.     . traMADol (ULTRAM) 50 MG tablet Take 1 tablet (50 mg total) by mouth 2 (two) times daily as needed for moderate pain. (Patient not taking: Reported on 03/06/2021) 60 tablet 0   No current facility-administered medications for this visit.   Facility-Administered Medications Ordered in Other Visits  Medication Dose Route Frequency Provider Last Rate Last Admin  . heparin lock flush 100 unit/mL  500 Units Intracatheter Once PRN Charlaine Dalton R, MD      . sodium chloride flush (NS) 0.9 % injection 10 mL  10 mL Intravenous PRN Cammie Sickle, MD   10 mL at 07/07/18 0830    PHYSICAL EXAMINATION: ECOG PERFORMANCE STATUS: 2 - Symptomatic, <50% confined to bed  BP 118/62 (BP Location: Right Arm, Patient Position: Sitting, Cuff Size: Normal)   Pulse 78   Temp 97.9 F (36.6 C) (Tympanic)   Resp 16   Ht 5' 4"  (1.626 m)   Wt 173 lb 9.6 oz (78.7 kg)   SpO2 97%   BMI 29.80 kg/m   Filed Weights   03/06/21 0911  Weight: 173 lb 9.6 oz (78.7 kg)     Physical Exam Constitutional:      Comments: Patient is accompanied by husband.  In a wheelchair.  HENT:     Head: Normocephalic and atraumatic.     Mouth/Throat:     Pharynx: No  oropharyngeal exudate.  Eyes:     Pupils: Pupils are equal, round, and reactive to light.  Cardiovascular:     Rate and Rhythm: Normal rate and regular rhythm.  Pulmonary:     Effort: Pulmonary effort is normal. No respiratory distress.     Breath sounds: Normal breath sounds. No wheezing.  Abdominal:     General: Bowel sounds are normal. There is no distension.     Palpations: Abdomen is soft. There is no mass.     Tenderness: no abdominal tenderness There is no guarding or rebound.  Musculoskeletal:        General: No tenderness. Normal range of motion.     Cervical back: Normal range of motion and neck supple.  Skin:    General: Skin is warm.  Neurological:     Mental Status: She is alert and oriented to person, place, and time.  Psychiatric:        Mood and Affect: Affect normal.    LABORATORY DATA:  I have reviewed the data as listed    Component Value Date/Time   NA 134 (L) 03/06/2021 0854   K 3.6 03/06/2021 0854   CL 99 03/06/2021 0854   CO2 26 03/06/2021 0854   GLUCOSE 188 (H) 03/06/2021 0854   BUN 15 03/06/2021 0854   CREATININE 0.86 03/06/2021 0854   CALCIUM 8.2 (L) 03/06/2021 0854   PROT 6.5 03/06/2021 0854   ALBUMIN 3.9 03/06/2021 0854   AST 26 03/06/2021 0854   ALT 19 03/06/2021 0854   ALKPHOS 48 03/06/2021 0854   BILITOT 0.7 03/06/2021 0854   GFRNONAA >60 03/06/2021 0854   GFRAA >60 04/16/2020 0814    No results found for: SPEP, UPEP  Lab Results  Component Value Date   WBC 6.3 03/06/2021   NEUTROABS 4.6 03/06/2021   HGB 13.6 03/06/2021   HCT 42.8 03/06/2021   MCV 94.5 03/06/2021   PLT 281 03/06/2021      Chemistry      Component Value Date/Time   NA 134 (L) 03/06/2021  0854   K 3.6 03/06/2021 0854   CL 99 03/06/2021 0854   CO2 26 03/06/2021 0854   BUN 15 03/06/2021 0854   CREATININE 0.86 03/06/2021 0854      Component Value Date/Time   CALCIUM 8.2 (L) 03/06/2021 0854   ALKPHOS 48 03/06/2021 0854   AST 26 03/06/2021 0854   ALT 19  03/06/2021 0854   BILITOT 0.7 03/06/2021 0854       RADIOGRAPHIC STUDIES: I have personally reviewed the radiological images as listed and agreed with the findings in the report. No results found.   ASSESSMENT & PLAN:  Carcinoma of overlapping sites of left breast in female, estrogen receptor negative ([place]) #Metastatic breast cancer ER [name] HER-2/neu positive. MAY 2022-  CT scan chest and pelvis NED-  STABLE;   sclerosis of the manubrium.  STABLE.   # Currently on Herceptin-perjeta. Labs today reviewed;  acceptable for treatment today.   # Drop in ejection fraction-January 09, 2020 MUGA scan ejection fraction 47%; AUG 2021- MUGA scan- 61%. NOV 2021- EF-55% [2 months break- DEC-FEB, 2022]; July 2022- STABLE at 52.5%   # dry eyes/ keratitis sicca- *herceptin-perjeta; not sure if casutive factor.  Continue moisturizing eyedrops. STABLE.   # Chronic back and hip pain/ gait instability--STABLE.  On asper patch/ tramadol prn.    # Elevated BG-post breakfast in the range of 188; STABLE  HbA1c-5.6; await nutrition eval today.  Discussed importance of obesity/metabolic syndrome causing elevated blood sugars.  Strongly recommend eating more green leafy vegetables and cutting down processed food/ carbohydrates.  Instead increasing whole grains / protein in the diet.   DISPOSITION:  #Herceptin-Perjeta today # Follow up-in 3 weeks MD; labs-cbc/cmp; Herceptin-Perjeta;-Dr.B   No orders of the defined types were placed in this encounter.  All questions were answered. The patient knows to call the clinic with any problems, questions or concerns.      Cammie Sickle, MD 03/09/2021 6:12 PM

## 2021-03-06 NOTE — Progress Notes (Signed)
Nutrition Follow-up:  Patient with metastatic breast cancer.  Patient followed by Dr B and receiving kanjinti and perjeta.    Met with patient's husband for nutrition follow-up.  Husband reports that they are eating more home prepared meals than before.  Have a caregiver that prepares meals 2-3 times per week and are prepared low sodium and sugar.  Usually husband goes out 2 times per week for evening meal.  Breakfast is low sugar cereal, fruit,  small glass juice, water and coffee.  Lunch is peanut butter and jelly.  Dinner varies with what is prepared for them or take out.  Husband reports that they tend not to snack between meals.  Patient's appetite remains good. Husband has made some modest changes in diet of patient but wants to be sure patient is eating foods that she enjoys vs limiting foods.      Medications: reviewed  Labs: glucose 188  Anthropometrics:   Weight 173 lb today, stable   NUTRITION DIAGNOSIS: Food and nutrition related knowledge deficit improved    INTERVENTION:  Husband and patient have made some modest changes in diet being mindful of sodium and sugar. Agree with modest changes at this time.   RD available if needed and husband has contact information   NEXT VISIT: no follow-up RD available if needed  Kee Drudge B. Zenia Resides, [place], [place] Registered Dietitian [phone] (mobile)

## 2021-03-06 NOTE — Patient Instructions (Signed)
[facility]  Discharge Instructions: Thank you for choosing [facility] to provide your oncology and hematology care.  If you have a lab appointment with the [facility], please go directly to the [facility] and check in at the registration area.  Wear comfortable clothing and clothing appropriate for easy access to any Portacath or PICC line.   We strive to give you quality time with your provider. You may need to reschedule your appointment if you arrive late (15 or more minutes).  Arriving late affects you and other patients whose appointments are after yours.  Also, if you miss three or more appointments without notifying the office, you may be dismissed from the clinic at the provider's discretion.      For prescription refill requests, have your pharmacy contact our office and allow 72 hours for refills to be completed.    Today you received the following chemotherapy and/or immunotherapy agents - Herceptin/Perjeta      To help prevent nausea and vomiting after your treatment, we encourage you to take your nausea medication as directed.  BELOW ARE SYMPTOMS THAT SHOULD BE REPORTED IMMEDIATELY: *FEVER GREATER THAN 100.4 F (38 C) OR HIGHER *CHILLS OR SWEATING *NAUSEA AND VOMITING THAT IS NOT CONTROLLED WITH YOUR NAUSEA MEDICATION *UNUSUAL SHORTNESS OF BREATH *UNUSUAL BRUISING OR BLEEDING *URINARY PROBLEMS (pain or burning when urinating, or frequent urination) *BOWEL PROBLEMS (unusual diarrhea, constipation, pain near the anus) TENDERNESS IN MOUTH AND THROAT WITH OR WITHOUT PRESENCE OF ULCERS (sore throat, sores in mouth, or a toothache) UNUSUAL RASH, SWELLING OR PAIN  UNUSUAL VAGINAL DISCHARGE OR ITCHING   Items with * indicate a potential emergency and should be followed up as soon as possible or go to the Emergency Department if any problems should occur.  Please show the CHEMOTHERAPY ALERT CARD or IMMUNOTHERAPY ALERT CARD at  check-in to the Emergency Department and triage nurse.  Should you have questions after your visit or need to cancel or reschedule your appointment, please contact [facility]  [phone] and follow the prompts.  Office hours are 8:00 a.m. to 4:30 p.m. Monday - Friday. Please note that voicemails left after 4:00 p.m. may not be returned until the following business day.  We are closed weekends and major holidays. You have access to a nurse at all times for urgent questions. Please call the main number to the clinic [phone] and follow the prompts.  For any non-urgent questions, you may also contact your provider using MyChart. We now offer e-Visits for anyone 7 and older to request care online for non-urgent symptoms. For details visit [email or web address].   Also download the MyChart app! Go to the app store, search "MyChart", open the app, select [facility], and log in with your MyChart username and password.  Due to Covid, a mask is required upon entering the hospital/clinic. If you do not have a mask, one will be given to you upon arrival. For doctor visits, patients may have 1 support person aged 66 or older with them. For treatment visits, patients cannot have anyone with them due to current Covid guidelines and our immunocompromised population.   Trastuzumab injection for infusion What is this medication? TRASTUZUMAB (tras TOO zoo mab) is a monoclonal antibody. It is used to treatbreast cancer and stomach cancer. This medicine may be used for other purposes; ask your health care provider orpharmacist if you have questions. COMMON BRAND NAME(S): Herceptin, Donnald Garre What  should I tell my care team before I take this medication? They need to know if you have any of these conditions: heart disease heart failure lung or breathing disease, like asthma an unusual or allergic reaction to trastuzumab, benzyl  alcohol, or other medications, foods, dyes, or preservatives pregnant or trying to get pregnant breast-feeding How should I use this medication? This drug is given as an infusion into a vein. It is administered in a hospitalor clinic by a specially trained health care professional. Talk to your pediatrician regarding the use of this medicine in children. Thismedicine is not approved for use in children. Overdosage: If you think you have taken too much of this medicine contact apoison control center or emergency room at once. NOTE: This medicine is only for you. Do not share this medicine with others. What if I miss a dose? It is important not to miss a dose. Call your doctor or health careprofessional if you are unable to keep an appointment. What may interact with this medication? This medicine may interact with the following medications: certain types of chemotherapy, such as daunorubicin, doxorubicin, epirubicin, and idarubicin This list may not describe all possible interactions. Give your health care provider a list of all the medicines, herbs, non-prescription drugs, or dietary supplements you use. Also tell them if you smoke, drink alcohol, or use illegaldrugs. Some items may interact with your medicine. What should I watch for while using this medication? Visit your doctor for checks on your progress. Report any side effects. Continue your course of treatment even though you feel ill unless your doctortells you to stop. Call your doctor or health care professional for advice if you get a fever, chills or sore throat, or other symptoms of a cold or flu. Do not treatyourself. Try to avoid being around people who are sick. You may experience fever, chills and shaking during your first infusion. These effects are usually mild and can be treated with other medicines. Report any side effects during the infusion to your health care professional. Fever andchills usually do not happen with later  infusions. Do not become pregnant while taking this medicine or for 7 months after stopping it. Women should inform their doctor if they wish to become pregnant or think they might be pregnant. Women of child-bearing potential will need to have a negative pregnancy test before starting this medicine. There is a potential for serious side effects to an unborn child. Talk to your health care professional or pharmacist for more information. Do not breast-feed an infantwhile taking this medicine or for 7 months after stopping it. Women must use effective birth control with this medicine. What side effects may I notice from receiving this medication? Side effects that you should report to your doctor or health care professionalas soon as possible: allergic reactions like skin rash, itching or hives, swelling of the face, lips, or tongue chest pain or palpitations cough dizziness feeling faint or lightheaded, falls fever general ill feeling or flu-like symptoms signs of worsening heart failure like breathing problems; swelling in your legs and feet unusually weak or tired Side effects that usually do not require medical attention (report to yourdoctor or health care professional if they continue or are bothersome): bone pain changes in taste diarrhea joint pain nausea/vomiting weight loss This list may not describe all possible side effects. Call your doctor for medical advice about side effects. You may report side effects to FDA at1-800-FDA-1088. Where should I keep my medication? This drug is given  in a hospital or clinic and will not be stored at home. NOTE: This sheet is a summary. It may not cover all possible information. If you have questions about this medicine, talk to your doctor, pharmacist, orhealth care provider.  2022 Elsevier/Gold Standard (2016-07-08 14:37:52)  Pertuzumab injection What is this medication? PERTUZUMAB (per TOOZ ue mab) is a monoclonal antibody. It is used to  treatbreast cancer. This medicine may be used for other purposes; ask your health care provider orpharmacist if you have questions. COMMON BRAND NAME(S): PERJETA What should I tell my care team before I take this medication? They need to know if you have any of these conditions: heart disease heart failure high blood pressure history of irregular heart beat recent or ongoing radiation therapy an unusual or allergic reaction to pertuzumab, other medicines, foods, dyes, or preservatives pregnant or trying to get pregnant breast-feeding How should I use this medication? This medicine is for infusion into a vein. It is given by a health careprofessional in a hospital or clinic setting. Talk to your pediatrician regarding the use of this medicine in children.Special care may be needed. Overdosage: If you think you have taken too much of this medicine contact apoison control center or emergency room at once. NOTE: This medicine is only for you. Do not share this medicine with others. What if I miss a dose? It is important not to miss your dose. Call your doctor or health careprofessional if you are unable to keep an appointment. What may interact with this medication? Interactions are not expected. Give your health care provider a list of all the medicines, herbs, non-prescription drugs, or dietary supplements you use. Also tell them if you smoke, drink alcohol, or use illegal drugs. Some items may interact with yourmedicine. This list may not describe all possible interactions. Give your health care provider a list of all the medicines, herbs, non-prescription drugs, or dietary supplements you use. Also tell them if you smoke, drink alcohol, or use illegaldrugs. Some items may interact with your medicine. What should I watch for while using this medication? Your condition will be monitored carefully while you are receiving this medicine. Report any side effects. Continue your course of treatment  eventhough you feel ill unless your doctor tells you to stop. Do not become pregnant while taking this medicine or for 7 months after stopping it. Women should inform their doctor if they wish to become pregnant or think they might be pregnant. Women of child-bearing potential will need to have a negative pregnancy test before starting this medicine. There is a potential for serious side effects to an unborn child. Talk to your health care professional or pharmacist for more information. Do not breast-feed an infantwhile taking this medicine or for 7 months after stopping it. Women must use effective birth control with this medicine. Call your doctor or health care professional for advice if you get a fever, chills or sore throat, or other symptoms of a cold or flu. Do not treatyourself. Try to avoid being around people who are sick. You may experience fever, chills, and headache during the infusion. Report anyside effects during the infusion to your health care professional. What side effects may I notice from receiving this medication? Side effects that you should report to your doctor or health care professionalas soon as possible: breathing problems chest pain or palpitations dizziness feeling faint or lightheaded fever or chills skin rash, itching or hives sore throat swelling of the face, lips, or tongue swelling  of the legs or ankles unusually weak or tired Side effects that usually do not require medical attention (report to yourdoctor or health care professional if they continue or are bothersome): diarrhea hair loss nausea, vomiting tiredness This list may not describe all possible side effects. Call your doctor for medical advice about side effects. You may report side effects to FDA at1-800-FDA-1088. Where should I keep my medication? This drug is given in a hospital or clinic and will not be stored at home. NOTE: This sheet is a summary. It may not cover all possible  information. If you have questions about this medicine, talk to your doctor, pharmacist, orhealth care provider.  2022 Elsevier/Gold Standard (2015-08-16 12:08:50)

## 2021-03-09 ENCOUNTER — Encounter: Payer: Self-pay | Admitting: Internal Medicine

## 2021-03-27 ENCOUNTER — Inpatient Hospital Stay (HOSPITAL_BASED_OUTPATIENT_CLINIC_OR_DEPARTMENT_OTHER): Payer: Medicare Other | Admitting: Internal Medicine

## 2021-03-27 ENCOUNTER — Inpatient Hospital Stay: Payer: Medicare Other

## 2021-03-27 ENCOUNTER — Encounter: Payer: Self-pay | Admitting: Internal Medicine

## 2021-03-27 VITALS — BP 116/68 | HR 74 | Temp 97.2°F | Resp 16 | Ht 64.0 in | Wt 171.8 lb

## 2021-03-27 DIAGNOSIS — Z5112 Encounter for antineoplastic immunotherapy: Secondary | ICD-10-CM | POA: Diagnosis not present

## 2021-03-27 DIAGNOSIS — Z171 Estrogen receptor negative status [ER-]: Secondary | ICD-10-CM

## 2021-03-27 DIAGNOSIS — C50812 Malignant neoplasm of overlapping sites of left female breast: Secondary | ICD-10-CM

## 2021-03-27 LAB — CBC WITH DIFFERENTIAL/PLATELET
Abs Immature Granulocytes: 0.02 10*3/uL (ref 0.00–0.07)
Basophils Absolute: 0 10*3/uL (ref 0.0–0.1)
Basophils Relative: 1 %
Eosinophils Absolute: 0.2 10*3/uL (ref 0.0–0.5)
Eosinophils Relative: 3 %
HCT: 44.5 % (ref 36.0–46.0)
Hemoglobin: 14.1 g/dL (ref 12.0–15.0)
Immature Granulocytes: 0 %
Lymphocytes Relative: 18 %
Lymphs Abs: 1.1 10*3/uL (ref 0.7–4.0)
MCH: 29.5 pg (ref 26.0–34.0)
MCHC: 31.7 g/dL (ref 30.0–36.0)
MCV: 93.1 fL (ref 80.0–100.0)
Monocytes Absolute: 0.3 10*3/uL (ref 0.1–1.0)
Monocytes Relative: 5 %
Neutro Abs: 4.3 10*3/uL (ref 1.7–7.7)
Neutrophils Relative %: 73 %
Platelets: 291 10*3/uL (ref 150–400)
RBC: 4.78 MIL/uL (ref 3.87–5.11)
RDW: 13.2 % (ref 11.5–15.5)
WBC: 5.9 10*3/uL (ref 4.0–10.5)
nRBC: 0 % (ref 0.0–0.2)

## 2021-03-27 LAB — COMPREHENSIVE METABOLIC PANEL
ALT: 17 U/L (ref 0–44)
AST: 29 U/L (ref 15–41)
Albumin: 4 g/dL (ref 3.5–5.0)
Alkaline Phosphatase: 50 U/L (ref 38–126)
Anion gap: 8 (ref 5–15)
BUN: 15 mg/dL (ref 8–23)
CO2: 27 mmol/L (ref 22–32)
Calcium: 8.4 mg/dL — ABNORMAL LOW (ref 8.9–10.3)
Chloride: 99 mmol/L (ref 98–111)
Creatinine, Ser: 0.75 mg/dL (ref 0.44–1.00)
GFR, Estimated: >60 mL/min (ref >60)
Glucose, Bld: 195 mg/dL — ABNORMAL HIGH (ref 70–99)
Potassium: 3.4 mmol/L — ABNORMAL LOW (ref 3.5–5.1)
Sodium: 134 mmol/L — ABNORMAL LOW (ref 135–145)
Total Bilirubin: 0.7 mg/dL (ref 0.3–1.2)
Total Protein: 7.2 g/dL (ref 6.5–8.1)

## 2021-03-27 MED ORDER — DIPHENHYDRAMINE HCL 50 MG/ML IJ SOLN
12.5000 mg | Freq: Once | INTRAMUSCULAR | Status: AC
  Administered 2021-03-27: 12.5 mg via INTRAVENOUS
  Filled 2021-03-27: qty 1

## 2021-03-27 MED ORDER — SODIUM CHLORIDE 0.9 % IV SOLN
420.0000 mg | Freq: Once | INTRAVENOUS | Status: AC
  Administered 2021-03-27: 420 mg via INTRAVENOUS
  Filled 2021-03-27: qty 14

## 2021-03-27 MED ORDER — SODIUM CHLORIDE 0.9% FLUSH
10.0000 mL | Freq: Once | INTRAVENOUS | Status: AC
  Administered 2021-03-27: 10 mL via INTRAVENOUS
  Filled 2021-03-27: qty 10

## 2021-03-27 MED ORDER — HEPARIN SOD (PORK) LOCK FLUSH 100 UNIT/ML IV SOLN
INTRAVENOUS | Status: AC
  Administered 2021-03-27: 500 [IU] via INTRAVENOUS
  Filled 2021-03-27: qty 5

## 2021-03-27 MED ORDER — SODIUM CHLORIDE 0.9 % IV SOLN
Freq: Once | INTRAVENOUS | Status: AC
  Filled 2021-03-27: qty 250

## 2021-03-27 MED ORDER — TRASTUZUMAB-ANNS CHEMO 150 MG IV SOLR
450.0000 mg | Freq: Once | INTRAVENOUS | Status: AC
  Administered 2021-03-27: 450 mg via INTRAVENOUS
  Filled 2021-03-27: qty 21.43

## 2021-03-27 MED ORDER — ACETAMINOPHEN 325 MG PO TABS
650.0000 mg | ORAL_TABLET | Freq: Once | ORAL | Status: AC
  Administered 2021-03-27: 650 mg via ORAL
  Filled 2021-03-27: qty 2

## 2021-03-27 MED ORDER — HEPARIN SOD (PORK) LOCK FLUSH 100 UNIT/ML IV SOLN
500.0000 [IU] | Freq: Once | INTRAVENOUS | Status: AC
  Filled 2021-03-27: qty 5

## 2021-03-27 NOTE — Progress Notes (Signed)
**[name] De-Identified via Obfuscation** Michele [name]  Patient Care Team: [name], MD as PCP - General (Internal Medicine) Bary Castilla, [name], MD (General Surgery) Requested, Self Cammie Sickle, MD as Consulting Physician (Internal Medicine)  Cancer Staging No matching staging information was found for the patient.   Oncology History Overview [name]  # June 2016- LEFT BREAST CA;  [name] ; [name] ; [name]; Her 2 Neu POS; [place] from July OF 2016; [name]; adjuvant Herceptin [ Finished July 2017]; AUG 2017- Neratinib x5 days; DISCON sec to diarrhea  # MID OCT 2018- Right breast mass-Bx- ER/PR-NEG; Her 2 NEU POSITIVE 1~2.5cm;  [?NEW primary]  # MID-OCT 2018-METASTATIC RECURRENT-oh sternal mass; Left Ax LN [Bx]/periportal LN  # OCT 25th 2018- TAXOL-HERCEPTIN-PERJETA; Jan 2019- CT PR; continue HP only; on HOLD since NOV 2019--sec to drop in EF/Sieziues  # July 15th 2020- RE-STARTED HP   -------------------------------------------------------------------  # MUGA scan- July 28th 2017- 67%.  December 2019-EF 53%; January 2020 EF-52%  # chronic gait/balance issues  # Seizures/petit-mal/ Dr.Shah-Keppra [name] [id number]]  # June 2017- left breast Bx- fat necrosis [[name]  # july 2017-  BRCA 1& 2- NEG.   MOLECULAR TESTING- F ONE- TPS- 0%;  ERB2 amplification; PI3K/RET amplification Others**  # PALLIATIVE CARE: P  --------------------------------------------------    DIAGNOSIS: [ OCT 6468]- REC/MET- BREAST CA ER/PR-NEG; her 2 POS  STAGE: 4  ;GOALS: Palliative  CURRENT/MOST RECENT THERAPY- Herceptin-Perjeta [C]    Carcinoma of overlapping sites of left breast in female, estrogen receptor negative ([facility])     INTERVAL HISTORY: Patient a poor historian given dementia.  Patient is accompanied by her daughter today.   Michele [name] 80 y.o.  female pleasant patient above history of metastatic breast cancer on  Herceptin plus Perjeta-is here for follow-up.  The patient continues to be fatigued especially post COVID/infection.  No headaches.  No falls but noted to have gait instability.  This is not a new.  No headaches.  Continues to have itchy eyes.  Review of Systems  Unable to perform ROS: Dementia   PAST MEDICAL HISTORY :  Past Medical History:  Diagnosis Date  . Arthritis   . 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 Breast cancer ([facility]) 2018   left breast; surgery 01/11/15 with Dr. Bary Castilla; path with invasive mammary and DCIS  . Breast cancer of upper-inner quadrant of left female breast ([facility]) 12/15/14   Completed radiation end of December and finished chemotherapy 2 weeks ago, Left breast invasive mammary carcinoma, T1cN30mc (1.5 cm); Grade 3, [name], [name], [name], .  .Marland KitchenCataract    bilat   . DDD (degenerative disc disease), lumbar    Lumbar, previously evaluated by Dr. [place]arnestine Leys . Dementia ([place]   . Fibrocystic breast disease    prior biopsy  . Glaucoma   . 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 Hearing loss   . History of cancer chemotherapy   . History of radiation therapy   . Hyperlipidemia, unspecified   . Imbalance   . Memory impairment    seen by Dr SManuella Ghazi possible post crainiotomy from radiation  . 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  . Numbness and tingling    right hand   . Osteoarthritis   .  Osteoporosis, post-menopausal   . Personal history of chemotherapy   . Personal history of radiation therapy   . Shoulder pain, right   . Wears glasses     PAST SURGICAL HISTORY :   Past Surgical History:  Procedure Laterality Date  . APPENDECTOMY  1950  . BRAIN SURGERY  1995   left frontal/temporal  . BREAST BIOPSY Left 12/15/14   confirmed DCIS  . BREAST BIOPSY Left 01/08/2015   Procedure: BREAST BIOPSY WITH NEEDLE LOCALIZATION;  Surgeon:  [name], MD;  Location: [facility] ORS;  Service: General;  Laterality: Left;  . BREAST EXCISIONAL BIOPSY Left 1997  . BREAST LUMPECTOMY Left 01/08/2015   Procedure: LUMPECTOMY;  Surgeon: [name], MD;  Location: [facility] ORS;  Service: General;  Laterality: Left;  . COLONOSCOPY  2010   Dr. [name]  . ORIF ANKLE FRACTURE Right 08/05/2017   Procedure: OPEN REDUCTION INTERNAL FIXATION (ORIF) ANKLE FRACTURE;  Surgeon: Earnestine Leys, MD;  Location: [facility] ORS;  Service: Orthopedics;  Laterality: Right;  . PORTACATH PLACEMENT Right 01/16/2015   Procedure: INSERTION PORT-A-CATH;  Surgeon: [name], MD;  Location: [facility] ORS;  Service: General;  Laterality: Right;  . SENTINEL NODE BIOPSY Left 01/16/2015   Procedure: SENTINEL NODE BIOPSY;  Surgeon: [name], MD;  Location: [facility] ORS;  Service: General;  Laterality: Left;  . TOTAL HIP ARTHROPLASTY Right 09/04/2015   Procedure: RIGHT TOTAL HIP ARTHROPLASTY ANTERIOR APPROACH;  Surgeon: [name], MD;  Location: [facility];  Service: Orthopedics;  Laterality: Right;    FAMILY HISTORY :   Family History  Problem Relation Age of Onset  . Breast cancer Sister 9  . Addison's disease Mother   . Hyperthyroidism Mother   . Osteoarthritis Mother   . Stroke Father   . Heart disease Father   . High blood pressure Father     SOCIAL HISTORY:   Social History   Tobacco Use  . Smoking status: Former    Packs/day: 0.25    Years: 5.00    Pack years: 1.25    Types: Cigarettes    Quit date: 07/28/1972    Years since quitting: 48.6  . Smokeless tobacco: Never  Vaping Use  . Vaping Use: Never used  Substance Use Topics  . Alcohol use: Yes    Alcohol/week: 1.0 - 2.0 standard drink    Types: 1 - 2 Glasses of wine per week    Comment: 1 Glass Wine / Night  . Drug use: No    ALLERGIES:  is allergic to donepezil.  MEDICATIONS:  Current Outpatient Medications  Medication Sig Dispense Refill  . acetaminophen (TYLENOL) 325 MG tablet Take  650 mg by mouth every 4 (four) hours as needed. Pain / increased temp.    Marland Kitchen albuterol (VENTOLIN HFA) 108 (90 Base) MCG/ACT inhaler Inhale 2 puffs into the lungs every 4 (four) hours as needed for wheezing or shortness of breath. 18 g 0  . ARTIFICIAL TEARS 1 % ophthalmic solution Place 1 drop into both eyes daily.    Marland Kitchen ascorbic acid (VITAMIN C) 500 MG tablet Take 1 tablet by mouth daily.    . Calcium Carbonate-Vitamin D (CALTRATE 600+D PO) Take 1 tablet by mouth daily.    . celecoxib (CELEBREX) 200 MG capsule Take 200 mg by mouth daily.     Marland Kitchen levETIRAcetam (KEPPRA) 250 MG tablet 1 tablet daily at bedtime  5  . Travoprost, BAK Free, (TRAVATAN) 0.004 % SOLN ophthalmic solution travoprost 0.004 % eye drops    .  vitamin E 400 UNIT capsule Take 400 Units by mouth daily.     . traMADol (ULTRAM) 50 MG tablet Take 1 tablet (50 mg total) by mouth 2 (two) times daily as needed for moderate pain. (Patient not taking: No sig reported) 60 tablet 0   No current facility-administered medications for this visit.   Facility-Administered Medications Ordered in Other Visits  Medication Dose Route Frequency Provider Last Rate Last Admin  . acetaminophen (TYLENOL) tablet 650 mg  650 mg Oral Once Charlaine Dalton R, MD      . diphenhydrAMINE (BENADRYL) injection 12.5 mg  12.5 mg Intravenous Once Charlaine Dalton R, MD      . heparin lock flush 100 unit/mL  500 Units Intracatheter Once PRN Charlaine Dalton R, MD      . heparin lock flush 100 unit/mL  500 Units Intravenous Once Cammie Sickle, MD      . pertuzumab (PERJETA) 420 mg in sodium chloride 0.9 % 250 mL chemo infusion  420 mg Intravenous Once Charlaine Dalton R, MD      . sodium chloride flush (NS) 0.9 % injection 10 mL  10 mL Intravenous PRN Cammie Sickle, MD   10 mL at 07/07/18 0830  . trastuzumab-anns (KANJINTI) 450 mg in sodium chloride 0.9 % 250 mL chemo infusion  450 mg Intravenous Once Cammie Sickle, MD         PHYSICAL EXAMINATION: ECOG PERFORMANCE STATUS: 2 - Symptomatic, <50% confined to bed  BP 116/68 (BP Location: Right Arm, Patient Position: Sitting, Cuff Size: Normal)   Pulse 74   Temp (!) 97.2 F (36.2 C) (Tympanic)   Resp 16   Ht _0  (1.626 m)   Wt 171 lb 12.8 oz (77.9 kg)   SpO2 95%   BMI 29.49 kg/m   Filed Weights   03/27/21 0844  Weight: 171 lb 12.8 oz (77.9 kg)     Physical Exam Constitutional:      Comments: Patient is accompanied by husband.  In a wheelchair.  HENT:     Head: Normocephalic and atraumatic.     Mouth/Throat:     Pharynx: No oropharyngeal exudate.  Eyes:     Pupils: Pupils are equal, round, and reactive to light.  Cardiovascular:     Rate and Rhythm: Normal rate and regular rhythm.  Pulmonary:     Effort: Pulmonary effort is normal. No respiratory distress.     Breath sounds: Normal breath sounds. No wheezing.  Abdominal:     General: Bowel sounds are normal. There is no distension.     Palpations: Abdomen is soft. There is no mass.     Tenderness: There is no abdominal tenderness. There is no guarding or rebound.  Musculoskeletal:        General: No tenderness. Normal range of motion.     Cervical back: Normal range of motion and neck supple.  Skin:    General: Skin is warm.  Neurological:     Mental Status: She is alert and oriented to person, place, and time.  Psychiatric:        Mood and Affect: Affect normal.    LABORATORY DATA:  I have reviewed the data as listed    Component Value Date/Time   NA 134 (L) 03/27/2021 0834   K 3.4 (L) 03/27/2021 0834   CL 99 03/27/2021 0834   CO2 27 03/27/2021 0834   GLUCOSE 195 (H) 03/27/2021 0834   BUN 15 03/27/2021 0834   CREATININE 0.75 03/27/2021  4665   CALCIUM 8.4 (L) 03/27/2021 0834   PROT 7.2 03/27/2021 0834   ALBUMIN 4.0 03/27/2021 0834   AST 29 03/27/2021 0834   ALT 17 03/27/2021 0834   ALKPHOS 50 03/27/2021 0834   BILITOT 0.7 03/27/2021 0834   GFRNONAA >60 03/27/2021 0834    GFRAA >60 04/16/2020 0814    No results found for: SPEP, UPEP  Lab Results  Component Value Date   WBC 5.9 03/27/2021   NEUTROABS 4.3 03/27/2021   HGB 14.1 03/27/2021   HCT 44.5 03/27/2021   MCV 93.1 03/27/2021   PLT 291 03/27/2021      Chemistry      Component Value Date/Time   NA 134 (L) 03/27/2021 0834   K 3.4 (L) 03/27/2021 0834   CL 99 03/27/2021 0834   CO2 27 03/27/2021 0834   BUN 15 03/27/2021 0834   CREATININE 0.75 03/27/2021 0834      Component Value Date/Time   CALCIUM 8.4 (L) 03/27/2021 0834   ALKPHOS 50 03/27/2021 0834   AST 29 03/27/2021 0834   ALT 17 03/27/2021 0834   BILITOT 0.7 03/27/2021 0834       RADIOGRAPHIC STUDIES: I have personally reviewed the radiological images as listed and agreed with the findings in the report. No results found.   ASSESSMENT & PLAN:  No problem-specific Assessment & Plan notes found for this encounter.   Orders Placed This Encounter  Procedures  . CT CHEST ABDOMEN PELVIS W CONTRAST    Standing Status:   Future    Standing Expiration Date:   03/27/2022    Order Specific Question:   Preferred imaging location?    Answer:   [facility]    Order Specific Question:   Radiology Contrast Protocol - do NOT remove file path    Answer:   [email or web address]    All questions were answered. The patient knows to call the clinic with any problems, questions or concerns.      Cammie Sickle, MD 03/27/2021 10:09 AM

## 2021-03-27 NOTE — Patient Instructions (Signed)
[facility] ONCOLOGY   Discharge Instructions: Thank you for choosing [facility] to provide your oncology and hematology care.  If you have a lab appointment with the [facility], please go directly to the [facility] and check in at the registration area.  Wear comfortable clothing and clothing appropriate for easy access to any Portacath or PICC line.   We strive to give you quality time with your provider. You may need to reschedule your appointment if you arrive late (15 or more minutes).  Arriving late affects you and other patients whose appointments are after yours.  Also, if you miss three or more appointments without notifying the office, you may be dismissed from the clinic at the provider's discretion.      For prescription refill requests, have your pharmacy contact our office and allow 72 hours for refills to be completed.    Today you received the following chemotherapy and/or immunotherapy agents: Kanjinti, Perjeta.      To help prevent nausea and vomiting after your treatment, we encourage you to take your nausea medication as directed.  BELOW ARE SYMPTOMS THAT SHOULD BE REPORTED IMMEDIATELY: *FEVER GREATER THAN 100.4 F (38 C) OR HIGHER *CHILLS OR SWEATING *NAUSEA AND VOMITING THAT IS NOT CONTROLLED WITH YOUR NAUSEA MEDICATION *UNUSUAL SHORTNESS OF BREATH *UNUSUAL BRUISING OR BLEEDING *URINARY PROBLEMS (pain or burning when urinating, or frequent urination) *BOWEL PROBLEMS (unusual diarrhea, constipation, pain near the anus) TENDERNESS IN MOUTH AND THROAT WITH OR WITHOUT PRESENCE OF ULCERS (sore throat, sores in mouth, or a toothache) UNUSUAL RASH, SWELLING OR PAIN  UNUSUAL VAGINAL DISCHARGE OR ITCHING   Items with * indicate a potential emergency and should be followed up as soon as possible or go to the Emergency Department if any problems should occur.  Please show the CHEMOTHERAPY ALERT CARD or IMMUNOTHERAPY ALERT CARD  at check-in to the Emergency Department and triage nurse.  Should you have questions after your visit or need to cancel or reschedule your appointment, please contact [facility]  [phone] and follow the prompts.  Office hours are 8:00 a.m. to 4:30 p.m. Monday - Friday. Please note that voicemails left after 4:00 p.m. may not be returned until the following business day.  We are closed weekends and major holidays. You have access to a nurse at all times for urgent questions. Please call the main number to the clinic [phone] and follow the prompts.  For any non-urgent questions, you may also contact your provider using MyChart. We now offer e-Visits for anyone 33 and older to request care online for non-urgent symptoms. For details visit [email or web address].   Also download the MyChart app! Go to the app store, search "MyChart", open the app, select [facility], and log in with your MyChart username and password.  Due to Covid, a mask is required upon entering the hospital/clinic. If you do not have a mask, one will be given to you upon arrival. For doctor visits, patients may have 1 support person aged 47 or older with them. For treatment visits, patients cannot have anyone with them due to current Covid guidelines and our immunocompromised population.

## 2021-03-27 NOTE — Assessment & Plan Note (Signed)
#  Metastatic breast cancer ER [name] HER-2/neu positive. MAY 2022-  CT scan chest and pelvis NED-  STABLE;   sclerosis of the manubrium.  STABLE.   # Currently on Herceptin-perjeta. Labs today reviewed;  acceptable for treatment today.   # Drop in ejection fraction-January 09, 2020 MUGA scan ejection fraction 47%; AUG 2021- MUGA scan- 61%. NOV 2021- EF-55% [2 months break- DEC-FEB, 2022]; July 2022- STABLE at 52.5%   # Left breast calcifications: [[name]- monitor  # dry eyes/ keratitis sicca- *herceptin-perjeta; not sure if casutive factor.  Continue moisturizing eyedrops. STABLE.   # Chronic back and hip pain/ gait instability--STABLE.  On asper patch/ tramadol prn.    # Elevated BG-post breakfast in the range of 195; STABLE;  HbA1c-5.6; s/p evaluation with Joli.   # Discussed with the daughter regarding overall prognosis stage IV cancers are usually incurable.  However patient has done extremely well from a cancer standpoint almost 4 years with metastatic disease.  Discussed options of chemo holidays/treatment breaks in the context of patient overall/quality of life [dry eyes/ongoing visits] versus continued therapy.  Re: palliative care also/overall prognosis.  Plan family meeting over the phone at next visit.  Plan visit with [facility].   DISPOSITION:  # Herceptin-Perjeta today # follow up with Josh/goals of care # Follow up-in 3 weeks MD; labs-cbc/cmp; Herceptin-Perjeta; CT C/A/P prior-Dr.B

## 2021-04-16 ENCOUNTER — Ambulatory Visit
Admission: RE | Admit: 2021-04-16 | Discharge: 2021-04-16 | Disposition: A | Discharge status: Home / Self Care | Payer: Medicare Other | Source: Ambulatory Visit | Attending: Internal Medicine | Admitting: Internal Medicine

## 2021-04-16 ENCOUNTER — Other Ambulatory Visit: Payer: Self-pay

## 2021-04-16 DIAGNOSIS — Z171 Estrogen receptor negative status [ER-]: Secondary | ICD-10-CM | POA: Diagnosis present

## 2021-04-16 DIAGNOSIS — C50812 Malignant neoplasm of overlapping sites of left female breast: Secondary | ICD-10-CM | POA: Insufficient documentation

## 2021-04-16 MED ORDER — IOHEXOL 350 MG/ML SOLN
100.0000 mL | Freq: Once | INTRAVENOUS | Status: AC | PRN
  Administered 2021-04-16: 100 mL via INTRAVENOUS

## 2021-04-17 ENCOUNTER — Encounter: Payer: Self-pay | Admitting: Internal Medicine

## 2021-04-17 ENCOUNTER — Inpatient Hospital Stay: Payer: Medicare Other | Attending: Internal Medicine

## 2021-04-17 ENCOUNTER — Inpatient Hospital Stay (HOSPITAL_BASED_OUTPATIENT_CLINIC_OR_DEPARTMENT_OTHER): Payer: Medicare Other | Admitting: Internal Medicine

## 2021-04-17 ENCOUNTER — Inpatient Hospital Stay: Payer: Medicare Other

## 2021-04-17 DIAGNOSIS — M25559 Pain in unspecified hip: Secondary | ICD-10-CM | POA: Insufficient documentation

## 2021-04-17 DIAGNOSIS — Z171 Estrogen receptor negative status [ER-]: Secondary | ICD-10-CM | POA: Diagnosis not present

## 2021-04-17 DIAGNOSIS — F0390 Unspecified dementia without behavioral disturbance: Secondary | ICD-10-CM | POA: Diagnosis not present

## 2021-04-17 DIAGNOSIS — M3501 Sicca syndrome with keratoconjunctivitis: Secondary | ICD-10-CM | POA: Insufficient documentation

## 2021-04-17 DIAGNOSIS — C50812 Malignant neoplasm of overlapping sites of left female breast: Secondary | ICD-10-CM | POA: Insufficient documentation

## 2021-04-17 DIAGNOSIS — R2689 Other abnormalities of gait and mobility: Secondary | ICD-10-CM | POA: Insufficient documentation

## 2021-04-17 DIAGNOSIS — G8929 Other chronic pain: Secondary | ICD-10-CM | POA: Insufficient documentation

## 2021-04-17 DIAGNOSIS — Z5112 Encounter for antineoplastic immunotherapy: Secondary | ICD-10-CM | POA: Insufficient documentation

## 2021-04-17 DIAGNOSIS — C7951 Secondary malignant neoplasm of bone: Secondary | ICD-10-CM | POA: Diagnosis not present

## 2021-04-17 LAB — CBC WITH DIFFERENTIAL/PLATELET
Abs Immature Granulocytes: 0.03 10*3/uL (ref 0.00–0.07)
Basophils Absolute: 0 10*3/uL (ref 0.0–0.1)
Basophils Relative: 0 %
Eosinophils Absolute: 0.1 10*3/uL (ref 0.0–0.5)
Eosinophils Relative: 2 %
HCT: 43.5 % (ref 36.0–46.0)
Hemoglobin: 13.8 g/dL (ref 12.0–15.0)
Immature Granulocytes: 0 %
Lymphocytes Relative: 15 %
Lymphs Abs: 1.1 10*3/uL (ref 0.7–4.0)
MCH: 29.4 pg (ref 26.0–34.0)
MCHC: 31.7 g/dL (ref 30.0–36.0)
MCV: 92.6 fL (ref 80.0–100.0)
Monocytes Absolute: 0.4 10*3/uL (ref 0.1–1.0)
Monocytes Relative: 5 %
Neutro Abs: 5.4 10*3/uL (ref 1.7–7.7)
Neutrophils Relative %: 78 %
Platelets: 287 10*3/uL (ref 150–400)
RBC: 4.7 MIL/uL (ref 3.87–5.11)
RDW: 13.7 % (ref 11.5–15.5)
WBC: 7 10*3/uL (ref 4.0–10.5)
nRBC: 0 % (ref 0.0–0.2)

## 2021-04-17 LAB — COMPREHENSIVE METABOLIC PANEL
ALT: 15 U/L (ref 0–44)
AST: 25 U/L (ref 15–41)
Albumin: 4 g/dL (ref 3.5–5.0)
Alkaline Phosphatase: 51 U/L (ref 38–126)
Anion gap: 9 (ref 5–15)
BUN: 12 mg/dL (ref 8–23)
CO2: 27 mmol/L (ref 22–32)
Calcium: 8.7 mg/dL — ABNORMAL LOW (ref 8.9–10.3)
Chloride: 101 mmol/L (ref 98–111)
Creatinine, Ser: 0.82 mg/dL (ref 0.44–1.00)
GFR, Estimated: >60 mL/min (ref >60)
Glucose, Bld: 192 mg/dL — ABNORMAL HIGH (ref 70–99)
Potassium: 3.8 mmol/L (ref 3.5–5.1)
Sodium: 137 mmol/L (ref 135–145)
Total Bilirubin: 0.7 mg/dL (ref 0.3–1.2)
Total Protein: 7 g/dL (ref 6.5–8.1)

## 2021-04-17 MED ORDER — DIPHENHYDRAMINE HCL 50 MG/ML IJ SOLN
12.5000 mg | Freq: Once | INTRAMUSCULAR | Status: AC
  Administered 2021-04-17: 12.5 mg via INTRAVENOUS
  Filled 2021-04-17: qty 1

## 2021-04-17 MED ORDER — SODIUM CHLORIDE 0.9% FLUSH
10.0000 mL | Freq: Once | INTRAVENOUS | Status: AC
  Administered 2021-04-17: 10 mL via INTRAVENOUS
  Filled 2021-04-17: qty 10

## 2021-04-17 MED ORDER — ACETAMINOPHEN 325 MG PO TABS
650.0000 mg | ORAL_TABLET | Freq: Once | ORAL | Status: AC
  Administered 2021-04-17: 650 mg via ORAL
  Filled 2021-04-17: qty 2

## 2021-04-17 MED ORDER — SODIUM CHLORIDE 0.9 % IV SOLN
420.0000 mg | Freq: Once | INTRAVENOUS | Status: AC
  Administered 2021-04-17: 420 mg via INTRAVENOUS
  Filled 2021-04-17: qty 14

## 2021-04-17 MED ORDER — HEPARIN SOD (PORK) LOCK FLUSH 100 UNIT/ML IV SOLN
INTRAVENOUS | Status: AC
  Administered 2021-04-17: 500 [IU]
  Filled 2021-04-17: qty 5

## 2021-04-17 MED ORDER — HEPARIN SOD (PORK) LOCK FLUSH 100 UNIT/ML IV SOLN
500.0000 [IU] | Freq: Once | INTRAVENOUS | Status: AC | PRN
  Filled 2021-04-17: qty 5

## 2021-04-17 MED ORDER — SODIUM CHLORIDE 0.9 % IV SOLN
450.0000 mg | Freq: Once | INTRAVENOUS | Status: AC
  Administered 2021-04-17: 450 mg via INTRAVENOUS
  Filled 2021-04-17: qty 21.43

## 2021-04-17 MED ORDER — SODIUM CHLORIDE 0.9 % IV SOLN
Freq: Once | INTRAVENOUS | Status: AC
  Filled 2021-04-17: qty 250

## 2021-04-17 NOTE — Patient Instructions (Signed)
[facility] ONCOLOGY  Discharge Instructions: Thank you for choosing [facility] to provide your oncology and hematology care.  If you have a lab appointment with the [facility], please go directly to the [facility] and check in at the registration area.  Wear comfortable clothing and clothing appropriate for easy access to any Portacath or PICC line.   We strive to give you quality time with your provider. You may need to reschedule your appointment if you arrive late (15 or more minutes).  Arriving late affects you and other patients whose appointments are after yours.  Also, if you miss three or more appointments without notifying the office, you may be dismissed from the clinic at the provider's discretion.      For prescription refill requests, have your pharmacy contact our office and allow 72 hours for refills to be completed.    Today you received the following chemotherapy and/or immunotherapy agents Kanjinti & Perjeta      To help prevent nausea and vomiting after your treatment, we encourage you to take your nausea medication as directed.  BELOW ARE SYMPTOMS THAT SHOULD BE REPORTED IMMEDIATELY: *FEVER GREATER THAN 100.4 F (38 C) OR HIGHER *CHILLS OR SWEATING *NAUSEA AND VOMITING THAT IS NOT CONTROLLED WITH YOUR NAUSEA MEDICATION *UNUSUAL SHORTNESS OF BREATH *UNUSUAL BRUISING OR BLEEDING *URINARY PROBLEMS (pain or burning when urinating, or frequent urination) *BOWEL PROBLEMS (unusual diarrhea, constipation, pain near the anus) TENDERNESS IN MOUTH AND THROAT WITH OR WITHOUT PRESENCE OF ULCERS (sore throat, sores in mouth, or a toothache) UNUSUAL RASH, SWELLING OR PAIN  UNUSUAL VAGINAL DISCHARGE OR ITCHING   Items with * indicate a potential emergency and should be followed up as soon as possible or go to the Emergency Department if any problems should occur.  Please show the CHEMOTHERAPY ALERT CARD or IMMUNOTHERAPY ALERT CARD at  check-in to the Emergency Department and triage nurse.  Should you have questions after your visit or need to cancel or reschedule your appointment, please contact [facility]  [phone] and follow the prompts.  Office hours are 8:00 a.m. to 4:30 p.m. Monday - Friday. Please note that voicemails left after 4:00 p.m. may not be returned until the following business day.  We are closed weekends and major holidays. You have access to a nurse at all times for urgent questions. Please call the main number to the clinic [phone] and follow the prompts.  For any non-urgent questions, you may also contact your provider using MyChart. We now offer e-Visits for anyone 57 and older to request care online for non-urgent symptoms. For details visit [email or web address].   Also download the MyChart app! Go to the app store, search "MyChart", open the app, select [facility], and log in with your MyChart username and password.  Due to Covid, a mask is required upon entering the hospital/clinic. If you do not have a mask, one will be given to you upon arrival. For doctor visits, patients may have 1 support person aged 81 or older with them. For treatment visits, patients cannot have anyone with them due to current Covid guidelines and our immunocompromised population.

## 2021-04-17 NOTE — Progress Notes (Signed)
Pt and caregiver in for follow up.  Denies any concern today, caregiver states everything is unchanged except for urinary incontinence that has become an issue in the last 2 weeks.

## 2021-04-17 NOTE — Progress Notes (Signed)
[facility] OFFICE PROGRESS NOTE  Patient Care Team: [name], MD as PCP - General (Internal Medicine) Bary Castilla, [name], MD (General Surgery) Requested, Self Cammie Sickle, MD as Consulting Physician (Internal Medicine)  Cancer Staging No matching staging information was found for the patient.   Oncology History Overview Note  # June 2016- LEFT BREAST CA;  [name] ; [name] ; [name]; Her 2 Neu POS; [facility] from July OF 2016; [name]; adjuvant Herceptin [ Finished July 2017]; AUG 2017- Neratinib x5 days; DISCON sec to diarrhea  # MID OCT 2018- Right breast mass-Bx- ER/PR-NEG; Her 2 NEU POSITIVE 1~2.5cm;  [?NEW primary]  # MID-OCT 2018-METASTATIC RECURRENT-oh sternal mass; Left Ax LN [Bx]/periportal LN  # OCT 25th 2018- TAXOL-HERCEPTIN-PERJETA; Jan 2019- CT PR; continue HP only; on HOLD since NOV 2019--sec to drop in EF/Sieziues  # July 15th 2020- RE-STARTED HP   -------------------------------------------------------------------  # MUGA scan- July 28th 2017- 67%.  December 2019-EF 53%; January 2020 EF-52%  # chronic gait/balance issues  # Seizures/petit-mal/ Dr.Shah-Keppra [name] [id number]]  # June 2017- left breast Bx- fat necrosis [[name]  # july 2017-  BRCA 1& 2- NEG.   MOLECULAR TESTING- F ONE- TPS- 0%;  ERB2 amplification; PI3K/RET amplification Others**  # PALLIATIVE CARE: P  --------------------------------------------------    DIAGNOSIS: [ OCT 4081]- REC/MET- BREAST CA ER/PR-NEG; her 2 POS  STAGE: 5  ;GOALS: Palliative  CURRENT/MOST RECENT THERAPY- Herceptin-Perjeta [C]    Carcinoma of overlapping sites of left breast in female, estrogen receptor negative ([facility])     INTERVAL HISTORY: Patient a poor historian given dementia.  Patient in a wheelchair.  Patient is accompanied by her husband today.  [name] 80 y.o.  female pleasant patient above history of metastatic  breast cancer on Herceptin plus Perjeta-is here for follow-up/review use of the restaging CT scan  As per the husband patient noted to have more frequent urinary incontinence about twice a week; rarely fecal incontinence.  No significant worsening back pain.  Continues to have gait instability. No falls. Continues to have itchy eyes.  Review of Systems  Unable to perform ROS: Dementia   PAST MEDICAL HISTORY :  Past Medical History:  Diagnosis Date  . Arthritis   . 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 Breast cancer ([facility]) 2018   left breast; surgery 01/11/15 with Dr. Bary Castilla; path with invasive mammary and DCIS  . Breast cancer of upper-inner quadrant of left female breast ([place]) 12/15/14   Completed radiation end of December and finished chemotherapy 2 weeks ago, Left breast invasive mammary carcinoma, T1cN60mc (1.5 cm); Grade 3, [name], [name], [name], .  .Marland KitchenCataract    bilat   . DDD (degenerative disc disease), lumbar    Lumbar, previously evaluated by Dr. HEarnestine Leys . Dementia ([facility]   . Fibrocystic breast disease    prior biopsy  . Glaucoma   . 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 Hearing loss   . History of cancer chemotherapy   . History of radiation therapy   . Hyperlipidemia, unspecified   . Imbalance   . Memory impairment    seen by Dr SManuella Ghazi possible post crainiotomy from radiation  . Meningioma ([place]    Left cavern[facility]  . Numbness and tingling  right hand   . Osteoarthritis   . Osteoporosis, post-menopausal   . Personal history of chemotherapy   . Personal history of radiation therapy   . Shoulder pain, right   . Wears glasses     PAST SURGICAL HISTORY :   Past Surgical History:  Procedure Laterality Date  . APPENDECTOMY  1950  . BRAIN SURGERY  1995   left frontal/temporal  . BREAST BIOPSY Left 12/15/14   confirmed DCIS  .  BREAST BIOPSY Left 01/08/2015   Procedure: BREAST BIOPSY WITH NEEDLE LOCALIZATION;  Surgeon: Jeffrey W Byrnett, MD;  Location: [facility] ORS;  Service: General;  Laterality: Left;  . BREAST EXCISIONAL BIOPSY Left 1997  . BREAST LUMPECTOMY Left 01/08/2015   Procedure: LUMPECTOMY;  Surgeon: Jeffrey W Byrnett, MD;  Location: [facility] ORS;  Service: General;  Laterality: Left;  . COLONOSCOPY  2010   Dr. Elliot  . ORIF ANKLE FRACTURE Right 08/05/2017   Procedure: OPEN REDUCTION INTERNAL FIXATION (ORIF) ANKLE FRACTURE;  Surgeon: Miller, Howard, MD;  Location: [facility] ORS;  Service: Orthopedics;  Laterality: Right;  . PORTACATH PLACEMENT Right 01/16/2015   Procedure: INSERTION PORT-A-CATH;  Surgeon: Jeffrey W Byrnett, MD;  Location: [facility] ORS;  Service: General;  Laterality: Right;  . SENTINEL NODE BIOPSY Left 01/16/2015   Procedure: SENTINEL NODE BIOPSY;  Surgeon: Jeffrey W Byrnett, MD;  Location: [facility] ORS;  Service: General;  Laterality: Left;  . TOTAL HIP ARTHROPLASTY Right 09/04/2015   Procedure: RIGHT TOTAL HIP ARTHROPLASTY ANTERIOR APPROACH;  Surgeon: Matthew Olin, MD;  Location: [facility];  Service: Orthopedics;  Laterality: Right;    FAMILY HISTORY :   Family History  Problem Relation Age of Onset  . Breast cancer Sister 48  . Addison's disease Mother   . Hyperthyroidism Mother   . Osteoarthritis Mother   . Stroke Father   . Heart disease Father   . High blood pressure Father     SOCIAL HISTORY:   Social History   Tobacco Use  . Smoking status: Former    Packs/day: 0.25    Years: 5.00    Pack years: 1.25    Types: Cigarettes    Quit date: 07/28/1972    Years since quitting: 48.7  . Smokeless tobacco: Never  Vaping Use  . Vaping Use: Never used  Substance Use Topics  . Alcohol use: Yes    Alcohol/week: 1.0 - 2.0 standard drink    Types: 1 - 2 Glasses of wine per week    Comment: 1 Glass Wine / Night  . Drug use: No    ALLERGIES:  is allergic to donepezil.  MEDICATIONS:  Current Outpatient  Medications  Medication Sig Dispense Refill  . acetaminophen (TYLENOL) 325 MG tablet Take 650 mg by mouth every 4 (four) hours as needed. Pain / increased temp.    . albuterol (VENTOLIN HFA) 108 (90 Base) MCG/ACT inhaler Inhale 2 puffs into the lungs every 4 (four) hours as needed for wheezing or shortness of breath. 18 g 0  . ARTIFICIAL TEARS 1 % ophthalmic solution Place 1 drop into both eyes daily.    . ascorbic acid (VITAMIN C) 500 MG tablet Take 1 tablet by mouth daily.    . Calcium Carbonate-Vitamin D (CALTRATE 600+D PO) Take 1 tablet by mouth daily.    . celecoxib (CELEBREX) 200 MG capsule Take 200 mg by mouth daily.     . levETIRAcetam (KEPPRA) 250 MG tablet 1 tablet daily at bedtime  5  . Travoprost, BAK Free, (TRAVATAN) 0.004 %   SOLN ophthalmic solution travoprost 0.004 % eye drops    . vitamin E 400 UNIT capsule Take 400 Units by mouth daily.     . traMADol (ULTRAM) 50 MG tablet Take 1 tablet (50 mg total) by mouth 2 (two) times daily as needed for moderate pain. (Patient not taking: Reported on 04/17/2021) 60 tablet 0   No current facility-administered medications for this visit.   Facility-Administered Medications Ordered in Other Visits  Medication Dose Route Frequency Provider Last Rate Last Admin  . heparin lock flush 100 unit/mL  500 Units Intracatheter Once PRN ,  R, MD      . sodium chloride flush (NS) 0.9 % injection 10 mL  10 mL Intravenous PRN ,  R, MD   10 mL at 07/07/18 0830    PHYSICAL EXAMINATION: ECOG PERFORMANCE STATUS: 2 - Symptomatic, <50% confined to bed  BP 110/60 (BP Location: Left Arm, Patient Position: Sitting)   Pulse 80   Temp 97.6 F (36.4 C) (Tympanic)   Resp 18   Wt 172 lb (78 kg)   SpO2 96%   BMI 29.52 kg/m   Filed Weights   04/17/21 0908  Weight: 172 lb (78 kg)     Physical Exam Constitutional:      Comments: Patient is accompanied by husband.  In a wheelchair.  HENT:     Head: Normocephalic and  atraumatic.     Mouth/Throat:     Pharynx: No oropharyngeal exudate.  Eyes:     Pupils: Pupils are equal, round, and reactive to light.  Cardiovascular:     Rate and Rhythm: Normal rate and regular rhythm.  Pulmonary:     Effort: Pulmonary effort is normal. No respiratory distress.     Breath sounds: Normal breath sounds. No wheezing.  Abdominal:     General: Bowel sounds are normal. There is no distension.     Palpations: Abdomen is soft. There is no mass.     Tenderness: There is no abdominal tenderness. There is no guarding or rebound.  Musculoskeletal:        General: No tenderness. Normal range of motion.     Cervical back: Normal range of motion and neck supple.  Skin:    General: Skin is warm.  Neurological:     Mental Status: She is alert and oriented to person, place, and time.  Psychiatric:        Mood and Affect: Affect normal.    LABORATORY DATA:  I have reviewed the data as listed    Component Value Date/Time   NA 137 04/17/2021 0842   K 3.8 04/17/2021 0842   CL 101 04/17/2021 0842   CO2 27 04/17/2021 0842   GLUCOSE 192 (H) 04/17/2021 0842   BUN 12 04/17/2021 0842   CREATININE 0.82 04/17/2021 0842   CALCIUM 8.7 (L) 04/17/2021 0842   PROT 7.0 04/17/2021 0842   ALBUMIN 4.0 04/17/2021 0842   AST 25 04/17/2021 0842   ALT 15 04/17/2021 0842   ALKPHOS 51 04/17/2021 0842   BILITOT 0.7 04/17/2021 0842   GFRNONAA >60 04/17/2021 0842   GFRAA >60 04/16/2020 0814    No results found for: SPEP, UPEP  Lab Results  Component Value Date   WBC 7.0 04/17/2021   NEUTROABS 5.4 04/17/2021   HGB 13.8 04/17/2021   HCT 43.5 04/17/2021   MCV 92.6 04/17/2021   PLT 287 04/17/2021      Chemistry      Component Value Date/Time   NA 137 04/17/2021   0842   K 3.8 04/17/2021 0842   CL 101 04/17/2021 0842   CO2 27 04/17/2021 0842   BUN 12 04/17/2021 0842   CREATININE 0.82 04/17/2021 0842      Component Value Date/Time   CALCIUM 8.7 (L) 04/17/2021 0842   ALKPHOS 51  04/17/2021 0842   AST 25 04/17/2021 0842   ALT 15 04/17/2021 0842   BILITOT 0.7 04/17/2021 0842       RADIOGRAPHIC STUDIES: I have personally reviewed the radiological images as listed and agreed with the findings in the report. CT CHEST ABDOMEN PELVIS W CONTRAST  Result Date: 04/16/2021 CLINICAL DATA:  History of breast cancer EXAM: CT CHEST, ABDOMEN, AND PELVIS WITH CONTRAST TECHNIQUE: Multidetector CT imaging of the chest, abdomen and pelvis was performed following the standard protocol during bolus administration of intravenous contrast. CONTRAST:  113m OMNIPAQUE IOHEXOL 350 MG/ML SOLN COMPARISON:  CT chest abdomen and pelvis dated Nov 29, 2020 FINDINGS: CT CHEST FINDINGS Cardiovascular: Normal heart size. No pericardial effusion. Atherosclerotic disease of the thoracic aorta. No significant coronary artery calcifications. Mediastinum/Nodes: No pathologically enlarged lymph nodes seen in the chest. Normal esophagus. Lungs/Pleura: Central airways are patent. Left upper lobe linear consolidation, likely post radiation change bibasilar atelectasis. No consolidation, pleural effusion or pneumothorax. No suspicious pulmonary nodules. Musculoskeletal: No chest wall mass or suspicious bone lesions identified. CT ABDOMEN PELVIS FINDINGS Hepatobiliary: Hepatic steatosis. No suspicious focal liver lesions. Gallbladder is unremarkable. No biliary ductal dilation. Pancreas: Unremarkable. No pancreatic ductal dilatation or surrounding inflammatory changes. Spleen: Normal in size without focal abnormality. Adrenals/Urinary Tract: Adrenal glands are unremarkable. Small bilateral renal lesions which are too small to completely characterize. Kidneys enhance symmetrically with no evidence of hydronephrosis or nephrolithiasis. Bladder is unremarkable. Stomach/Bowel: Stomach is within normal limits. Appendix is not visualized. No evidence of bowel wall thickening, distention, or inflammatory changes. Vascular/Lymphatic:  Aortic atherosclerosis. No enlarged abdominal or pelvic lymph nodes. Reproductive: Calcified fibroids of the uterus. Prominent right gonadal vein, unchanged compared to prior. No adnexal masses. Other: No abdominal wall hernia or abnormality. No abdominopelvic ascites. Musculoskeletal: Prior right total hip arthroplasty. 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 No aggressive appearing osseous lesions. IMPRESSION: No evidence of metastatic disease in the chest, abdomen or pelvis. Hepatic steatosis. Aortic Atherosclerosis (ICD10-I70.0). Electronically Signed   By: LYetta GlassmanM.D.   On: 04/16/2021 14:37     ASSESSMENT & PLAN:  Carcinoma of overlapping sites of left breast in female, estrogen receptor negative ([place] #Metastatic breast cancer ER [name] HER-2/neu positive. Apr 16, 2021--  CT scan chest and pelvis NED-  STABLE. sclerosis of the manubrium.  STABLE.   # Currently on Herceptin-perjeta. Labs today reviewed;  acceptable for treatment today.   # Drop in ejection fraction-January 09, 2020 MUGA scan ejection fraction 47%; AUG 2021- MUGA scan- 61%. NOV 2021- EF-55% [2 months break- DEC-FEB, 2022]; July 2022- STABLE at 52.5%   # Left breast calcifications: [[name]; July 2022]-discussed with Dr. BBary Castillareferred to surgery for further recommendation/follow-up mammogram-biopsy.  # dry eyes/ keratitis sicca- *herceptin-perjeta; not sure if casutive factor.  Continue moisturizing eyedrops- STABLE  # Chronic back and hip pain/ gait instability--STABLE; On asper patch/ tramadol prn.    # Elevated BG-post breakfast in the range of 192; STABLE;  HbA1c-5.6; s/p evaluation with Joli.   # Urinary incontininece twice a week-likely secondary to progressive dementia.  # Discussed with the husband regarding overall prognosis stage IV cancers are usually incurable; the median life expectancy is about  5 years.  However patient is 4 years postdiagnosis of her metastatic cancer.  Given the current  NED-noted on CT scan patient overall should go over the median from breast cancer perspective.  However patient's dementia/other comorbidities have to be factored in to the decision-making process.    DISPOSITION:  # Herceptin-Perjeta today # Follow up-in 3 weeks MD; labs-cbc/cmp; Herceptin-Perjeta-.B  # I reviewed the blood work- with the patient in detail; also reviewed the imaging independently [as summarized above]; and with the patient in detail.   # 40 minutes face-to-face with the patient discussing the above plan of care; more than 50% of time spent on prognosis/ natural history; counseling and coordination.      No orders of the defined types were placed in this encounter.   All questions were answered. The patient knows to call the clinic with any problems, questions or concerns.      Cammie Sickle, MD 04/17/2021 2:00 PM

## 2021-04-17 NOTE — Progress Notes (Signed)
I left a voicemail on two different phones for the patient daughter Sharyn Lull to discuss patients prognosis/results of the scans.

## 2021-04-17 NOTE — Assessment & Plan Note (Addendum)
#  Metastatic breast cancer ER [name] HER-2/neu positive. Apr 16, 2021--  CT scan chest and pelvis NED-  STABLE. sclerosis of the manubrium.  STABLE.   # Currently on Herceptin-perjeta. Labs today reviewed;  acceptable for treatment today.   # Drop in ejection fraction-January 09, 2020 MUGA scan ejection fraction 47%; AUG 2021- MUGA scan- 61%. NOV 2021- EF-55% [2 months break- DEC-FEB, 2022]; July 2022- STABLE at 52.5%   # Left breast calcifications: [[name]; July 2022]-discussed with Dr. Bary Castilla referred to surgery for further recommendation/follow-up mammogram-biopsy.  # dry eyes/ keratitis sicca- *herceptin-perjeta; not sure if casutive factor.  Continue moisturizing eyedrops- STABLE  # Chronic back and hip pain/ gait instability--STABLE; On asper patch/ tramadol prn.    # Elevated BG-post breakfast in the range of 192; STABLE;  HbA1c-5.6; s/p evaluation with Joli.   # Urinary incontininece twice a week-likely secondary to progressive dementia.  # Discussed with the husband regarding overall prognosis stage IV cancers are usually incurable; the median life expectancy is about 5 years.  However patient is 4 years postdiagnosis of her metastatic cancer.  Given the current NED-noted on CT scan patient overall should go over the median from breast cancer perspective.  However patient's dementia/other comorbidities have to be factored in to the decision-making process.    DISPOSITION:  # Herceptin-Perjeta today # Follow up-in 3 weeks MD; labs-cbc/cmp; Herceptin-Perjeta-.B  # I reviewed the blood work- with the patient in detail; also reviewed the imaging independently [as summarized above]; and with the patient in detail.   # 40 minutes face-to-face with the patient discussing the above plan of care; more than 50% of time spent on prognosis/ natural history; counseling and coordination.

## 2021-04-18 NOTE — Progress Notes (Signed)
Reviewed with patient daughter [name] regarding the results of the CT scan-NED. Also discussed that options include: surveillance off therapy versus continued therapy. After discussion of Pros and cons of each option it was decided the patient will continue current therapy at this time. As per the daughter patient does not seem to have any significant side effects from the therapy.

## 2021-05-07 ENCOUNTER — Ambulatory Visit
Admission: RE | Admit: 2021-05-07 | Discharge: 2021-05-07 | Disposition: A | Discharge status: Home / Self Care | Payer: Medicare Other | Source: Ambulatory Visit | Attending: General Surgery | Admitting: General Surgery

## 2021-05-07 ENCOUNTER — Other Ambulatory Visit: Payer: Self-pay

## 2021-05-07 DIAGNOSIS — R928 Other abnormal and inconclusive findings on diagnostic imaging of breast: Secondary | ICD-10-CM | POA: Insufficient documentation

## 2021-05-07 DIAGNOSIS — R921 Mammographic calcification found on diagnostic imaging of breast: Secondary | ICD-10-CM | POA: Insufficient documentation

## 2021-05-08 ENCOUNTER — Other Ambulatory Visit: Payer: Self-pay | Admitting: General Surgery

## 2021-05-08 ENCOUNTER — Inpatient Hospital Stay: Payer: Medicare Other | Attending: Internal Medicine

## 2021-05-08 ENCOUNTER — Inpatient Hospital Stay: Payer: Medicare Other

## 2021-05-08 ENCOUNTER — Encounter: Payer: Self-pay | Admitting: Internal Medicine

## 2021-05-08 ENCOUNTER — Inpatient Hospital Stay (HOSPITAL_BASED_OUTPATIENT_CLINIC_OR_DEPARTMENT_OTHER): Payer: Medicare Other | Admitting: Internal Medicine

## 2021-05-08 DIAGNOSIS — F0280 Dementia in other diseases classified elsewhere without behavioral disturbance: Secondary | ICD-10-CM | POA: Diagnosis not present

## 2021-05-08 DIAGNOSIS — M3501 Sicca syndrome with keratoconjunctivitis: Secondary | ICD-10-CM | POA: Diagnosis not present

## 2021-05-08 DIAGNOSIS — C50812 Malignant neoplasm of overlapping sites of left female breast: Secondary | ICD-10-CM | POA: Diagnosis present

## 2021-05-08 DIAGNOSIS — Z171 Estrogen receptor negative status [ER-]: Secondary | ICD-10-CM | POA: Insufficient documentation

## 2021-05-08 DIAGNOSIS — Z5112 Encounter for antineoplastic immunotherapy: Secondary | ICD-10-CM | POA: Diagnosis not present

## 2021-05-08 DIAGNOSIS — C7951 Secondary malignant neoplasm of bone: Secondary | ICD-10-CM | POA: Insufficient documentation

## 2021-05-08 DIAGNOSIS — Z95828 Presence of other vascular implants and grafts: Secondary | ICD-10-CM

## 2021-05-08 LAB — COMPREHENSIVE METABOLIC PANEL
ALT: 15 U/L (ref 0–44)
AST: 29 U/L (ref 15–41)
Albumin: 4.1 g/dL (ref 3.5–5.0)
Alkaline Phosphatase: 56 U/L (ref 38–126)
Anion gap: 11 (ref 5–15)
BUN: 14 mg/dL (ref 8–23)
CO2: 27 mmol/L (ref 22–32)
Calcium: 8.6 mg/dL — ABNORMAL LOW (ref 8.9–10.3)
Chloride: 99 mmol/L (ref 98–111)
Creatinine, Ser: 0.83 mg/dL (ref 0.44–1.00)
GFR, Estimated: >60 mL/min (ref >60)
Glucose, Bld: 199 mg/dL — ABNORMAL HIGH (ref 70–99)
Potassium: 3.7 mmol/L (ref 3.5–5.1)
Sodium: 137 mmol/L (ref 135–145)
Total Bilirubin: 0.5 mg/dL (ref 0.3–1.2)
Total Protein: 7.2 g/dL (ref 6.5–8.1)

## 2021-05-08 LAB — CBC WITH DIFFERENTIAL/PLATELET
Abs Immature Granulocytes: 0.02 10*3/uL (ref 0.00–0.07)
Basophils Absolute: 0 10*3/uL (ref 0.0–0.1)
Basophils Relative: 0 %
Eosinophils Absolute: 0.1 10*3/uL (ref 0.0–0.5)
Eosinophils Relative: 2 %
HCT: 44.3 % (ref 36.0–46.0)
Hemoglobin: 14.2 g/dL (ref 12.0–15.0)
Immature Granulocytes: 0 %
Lymphocytes Relative: 23 %
Lymphs Abs: 1.2 10*3/uL (ref 0.7–4.0)
MCH: 29.6 pg (ref 26.0–34.0)
MCHC: 32.1 g/dL (ref 30.0–36.0)
MCV: 92.3 fL (ref 80.0–100.0)
Monocytes Absolute: 0.3 10*3/uL (ref 0.1–1.0)
Monocytes Relative: 5 %
Neutro Abs: 3.7 10*3/uL (ref 1.7–7.7)
Neutrophils Relative %: 70 %
Platelets: 286 10*3/uL (ref 150–400)
RBC: 4.8 MIL/uL (ref 3.87–5.11)
RDW: 13.6 % (ref 11.5–15.5)
WBC: 5.4 10*3/uL (ref 4.0–10.5)
nRBC: 0 % (ref 0.0–0.2)

## 2021-05-08 MED ORDER — HEPARIN SOD (PORK) LOCK FLUSH 100 UNIT/ML IV SOLN
500.0000 [IU] | Freq: Once | INTRAVENOUS | Status: DC
  Filled 2021-05-08: qty 5

## 2021-05-08 MED ORDER — HEPARIN SOD (PORK) LOCK FLUSH 100 UNIT/ML IV SOLN
INTRAVENOUS | Status: AC
  Administered 2021-05-08: 500 [IU]
  Filled 2021-05-08: qty 5

## 2021-05-08 MED ORDER — ACETAMINOPHEN 325 MG PO TABS
650.0000 mg | ORAL_TABLET | Freq: Once | ORAL | Status: AC
  Administered 2021-05-08: 650 mg via ORAL
  Filled 2021-05-08: qty 2

## 2021-05-08 MED ORDER — TRASTUZUMAB-ANNS CHEMO 150 MG IV SOLR
450.0000 mg | Freq: Once | INTRAVENOUS | Status: AC
  Administered 2021-05-08: 450 mg via INTRAVENOUS
  Filled 2021-05-08: qty 21.43

## 2021-05-08 MED ORDER — SODIUM CHLORIDE 0.9 % IV SOLN
Freq: Once | INTRAVENOUS | Status: AC
  Filled 2021-05-08: qty 250

## 2021-05-08 MED ORDER — SODIUM CHLORIDE 0.9 % IV SOLN
420.0000 mg | Freq: Once | INTRAVENOUS | Status: AC
  Administered 2021-05-08: 420 mg via INTRAVENOUS
  Filled 2021-05-08: qty 14

## 2021-05-08 MED ORDER — DIPHENHYDRAMINE HCL 50 MG/ML IJ SOLN
12.5000 mg | Freq: Once | INTRAMUSCULAR | Status: AC
  Administered 2021-05-08: 12.5 mg via INTRAVENOUS
  Filled 2021-05-08: qty 1

## 2021-05-08 MED ORDER — SODIUM CHLORIDE 0.9% FLUSH
10.0000 mL | Freq: Once | INTRAVENOUS | Status: AC
  Administered 2021-05-08: 10 mL via INTRAVENOUS
  Filled 2021-05-08: qty 10

## 2021-05-08 NOTE — Progress Notes (Signed)
[facility]ESS NOTE  Patient Care Team: [name], MD as PCP - General (Internal Medicine) Bary Castilla, [name], MD (General Surgery) Requested, Self Cammie Sickle, MD as Consulting Physician (Internal Medicine)  Cancer Staging No matching staging information was found for the patient.   Oncology History Overview Note  # June 2016- LEFT BREAST CA;  [name] ; [name] ; [name]; Her 2 Neu POS; [place] from July OF 2016; [name]; adjuvant Herceptin [ Finished July 2017]; AUG 2017- Neratinib x5 days; DISCON sec to diarrhea  # MID OCT 2018- Right breast mass-Bx- ER/PR-NEG; Her 2 NEU POSITIVE 1~2.5cm;  [?NEW primary]  # MID-OCT 2018-METASTATIC RECURRENT-oh sternal mass; Left Ax LN [Bx]/periportal LN  # OCT 25th 2018- TAXOL-HERCEPTIN-PERJETA; Jan 2019- CT PR; continue HP only; on HOLD since NOV 2019--sec to drop in EF/Sieziues  # July 15th 2020- RE-STARTED HP   -------------------------------------------------------------------  # MUGA scan- July 28th 2017- 67%.  December 2019-EF 53%; January 2020 EF-52%  # chronic gait/balance issues  # Seizures/petit-mal/ Dr.Shah-Keppra [name] [id number]]  # June 2017- left breast Bx- fat necrosis [[name]  # july 2017-  BRCA 1& 2- NEG.   MOLECULAR TESTING- F ONE- TPS- 0%;  ERB2 amplification; PI3K/RET amplification Others**  # PALLIATIVE CARE: P  --------------------------------------------------    DIAGNOSIS: [ OCT 7846]- REC/MET- BREAST CA ER/PR-NEG; her 2 POS  STAGE: 4  ;GOALS: Palliative  CURRENT/MOST RECENT THERAPY- Herceptin-Perjeta [C]    Carcinoma of overlapping sites of left breast in female, estrogen receptor negative ([place])     INTERVAL HISTORY: Patient a poor historian given dementia.  Patient in a wheelchair.  Patient is accompanied by her husband today.  [name] 80 y.o.  female pleasant patient above history of metastatic  breast cancer on Herceptin plus Perjeta-is here for follow-up/review results of the mammogram.  No worsening back pain.  No worsening shortness of breath or cough.  Continues to have itchy eyes ; chronic mild back pain/gait instability.  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 Bary Castilla; path with invasive mammary and DCIS   Breast cancer of upper-inner quadrant of left female breast ([place]) 12/15/14   Completed radiation end of December and finished chemotherapy 2 weeks ago, Left breast invasive mammary carcinoma, T1cN8mc (1.5 cm); Grade 3, [name], [name], [name], .   Cataract    bilat    DDD (degenerative disc disease), lumbar    Lumbar, previously evaluated by Dr. 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   Numbness and tingling    right hand    Osteoarthritis    Osteoporosis, post-menopausal  Personal history of chemotherapy    Personal history of radiation therapy    Shoulder pain, right    Wears glasses     PAST SURGICAL HISTORY :   Past Surgical History:  Procedure Laterality Date   [place]   left frontal/temporal   BREAST BIOPSY Left 12/15/14   confirmed DCIS   BREAST BIOPSY Left 01/08/2015   Procedure: BREAST BIOPSY WITH NEEDLE LOCALIZATION;  Surgeon: [name], MD;  Location:  [facility] ORS;  Service: General;  Laterality: Left;   BREAST EXCISIONAL BIOPSY Left 1997   BREAST LUMPECTOMY Left 01/08/2015   Procedure: LUMPECTOMY;  Surgeon: [name], MD;  Location: [facility] ORS;  Service: General;  Laterality: Left;   COLONOSCOPY  2010   Dr. [name]   ORIF ANKLE FRACTURE Right 08/05/2017   Procedure: OPEN REDUCTION INTERNAL FIXATION (ORIF) ANKLE FRACTURE;  Surgeon: Earnestine Leys, MD;  Location: [facility] ORS;  Service: Orthopedics;  Laterality: Right;   PORTACATH PLACEMENT Right 01/16/2015   Procedure: INSERTION PORT-A-CATH;  Surgeon: [name], MD;  Location: [facility] ORS;  Service: General;  Laterality: Right;   SENTINEL NODE BIOPSY Left 01/16/2015   Procedure: SENTINEL NODE BIOPSY;  Surgeon: [name], MD;  Location: [facility] ORS;  Service: General;  Laterality: Left;   TOTAL HIP ARTHROPLASTY Right 09/04/2015   Procedure: RIGHT TOTAL HIP ARTHROPLASTY ANTERIOR APPROACH;  Surgeon: [name], MD;  Location: [facility];  Service: Orthopedics;  Laterality: Right;    FAMILY HISTORY :   Family History  Problem Relation Age of Onset   Breast cancer Sister 25   Addison's disease Mother    Hyperthyroidism Mother    Osteoarthritis Mother    Stroke Father    Heart disease Father    High blood pressure Father     SOCIAL HISTORY:   Social History   Tobacco Use   Smoking status: Former    Packs/day: 0.25    Years: 5.00    Pack years: 1.25    Types: Cigarettes    Quit date: 07/28/1972    Years since quitting: 48.8   Smokeless tobacco: Never  Vaping Use   Vaping Use: Never used  Substance Use Topics   Alcohol use: Yes    Alcohol/week: 1.0 - 2.0 standard drink    Types: 1 - 2 Glasses of wine per week    Comment: 1 Glass Wine / Night   Drug use: No    ALLERGIES:  is allergic to donepezil.  MEDICATIONS:  Current Outpatient Medications  Medication Sig Dispense Refill   acetaminophen (TYLENOL) 325 MG tablet Take 650 mg by mouth every 4 (four) hours as needed. Pain /  increased temp.     albuterol (VENTOLIN HFA) 108 (90 Base) MCG/ACT inhaler Inhale 2 puffs into the lungs every 4 (four) hours as needed for wheezing or shortness of breath. 18 g 0   ARTIFICIAL TEARS 1 % ophthalmic solution Place 1 drop into both eyes daily.     ascorbic acid (VITAMIN C) 500 MG tablet Take 1 tablet by mouth daily.     Calcium Carbonate-Vitamin D (CALTRATE 600+D PO) Take 1 tablet by mouth daily.     celecoxib (CELEBREX) 200 MG capsule Take 200 mg by mouth daily.      levETIRAcetam (KEPPRA) 250 MG tablet 1 tablet daily at bedtime  5   traMADol (ULTRAM) 50 MG tablet Take 1 tablet (50 mg total) by mouth 2 (two) times daily as needed  for moderate pain. 60 tablet 0   Travoprost, BAK Free, (TRAVATAN) 0.004 % SOLN ophthalmic solution travoprost 0.004 % eye drops     vitamin E 400 UNIT capsule Take 400 Units by mouth daily.      No current facility-administered medications for this visit.   Facility-Administered Medications Ordered in Other Visits  Medication Dose Route Frequency Provider Last Rate Last Admin   heparin lock flush 100 unit/mL  500 Units Intracatheter Once PRN Cammie Sickle, MD       sodium chloride flush (NS) 0.9 % injection 10 mL  10 mL Intravenous PRN Cammie Sickle, MD   10 mL at 07/07/18 0830    PHYSICAL EXAMINATION: ECOG PERFORMANCE STATUS: 2 - Symptomatic, <50% confined to bed  BP 118/60   Temp 98.1 F (36.7 C) (Oral)   Resp 16   Wt 171 lb 6.4 oz (77.7 kg)   BMI 29.42 kg/m   Filed Weights   05/08/21 0926  Weight: 171 lb 6.4 oz (77.7 kg)     Physical Exam Constitutional:      Comments: Patient is accompanied by husband.  In a wheelchair.  HENT:     Head: Normocephalic and atraumatic.     Mouth/Throat:     Pharynx: No oropharyngeal exudate.  Eyes:     Pupils: Pupils are equal, round, and reactive to light.  Cardiovascular:     Rate and Rhythm: Normal rate and regular rhythm.  Pulmonary:     Effort: Pulmonary effort is normal.  No respiratory distress.     Breath sounds: Normal breath sounds. No wheezing.  Abdominal:     General: Bowel sounds are normal. There is no distension.     Palpations: Abdomen is soft. There is no mass.     Tenderness: There is no abdominal tenderness. There is no guarding or rebound.  Musculoskeletal:        General: No tenderness. Normal range of motion.     Cervical back: Normal range of motion and neck supple.  Skin:    General: Skin is warm.  Neurological:     Mental Status: She is alert and oriented to person, place, and time.  Psychiatric:        Mood and Affect: Affect normal.    LABORATORY DATA:  I have reviewed the data as listed    Component Value Date/Time   NA 137 05/08/2021 0855   K 3.7 05/08/2021 0855   CL 99 05/08/2021 0855   CO2 27 05/08/2021 0855   GLUCOSE 199 (H) 05/08/2021 0855   BUN 14 05/08/2021 0855   CREATININE 0.83 05/08/2021 0855   CALCIUM 8.6 (L) 05/08/2021 0855   PROT 7.2 05/08/2021 0855   ALBUMIN 4.1 05/08/2021 0855   AST 29 05/08/2021 0855   ALT 15 05/08/2021 0855   ALKPHOS 56 05/08/2021 0855   BILITOT 0.5 05/08/2021 0855   GFRNONAA >60 05/08/2021 0855   GFRAA >60 04/16/2020 0814    No results found for: SPEP, UPEP  Lab Results  Component Value Date   WBC 5.4 05/08/2021   NEUTROABS 3.7 05/08/2021   HGB 14.2 05/08/2021   HCT 44.3 05/08/2021   MCV 92.3 05/08/2021   PLT 286 05/08/2021      Chemistry      Component Value Date/Time   NA 137 05/08/2021 0855   K 3.7 05/08/2021 0855   CL 99 05/08/2021 0855   CO2 27 05/08/2021 0855   BUN 14 05/08/2021 0855   CREATININE 0.83 05/08/2021  9242      Component Value Date/Time   CALCIUM 8.6 (L) 05/08/2021 0855   ALKPHOS 56 05/08/2021 0855   AST 29 05/08/2021 0855   ALT 15 05/08/2021 0855   BILITOT 0.5 05/08/2021 0855       RADIOGRAPHIC STUDIES: I have personally reviewed the radiological images as listed and agreed with the findings in the report. MM Digital Diagnostic Unilat  L  Result Date: 05/07/2021 CLINICAL DATA:  80 year old female presenting as a recall from screening for possible left breast calcifications. Patient has a history of left breast lumpectomy in 2016. Patient has a history of Alzheimer's disease. EXAM: DIGITAL DIAGNOSTIC UNILATERAL LEFT MAMMOGRAM WITH CAD TECHNIQUE: Left digital diagnostic mammography was performed. Mammographic images were processed with CAD. COMPARISON:  Previous exam(s). ACR Breast Density Category c: The breast tissue is heterogeneously dense, which may obscure small masses. FINDINGS: Exam is limited by difficulty with patient positioning, patient is unable to follow commands and hold still. Spot 2D magnification views and full field mL tomosynthesis views of the left breast were performed. There is persistence of grouped calcifications in the upper inner left breast posterior depth, located behind an oil cyst. The calcifications are punctate as well as linear, possibly vascular or early dystrophic. There is no definite associated mass. IMPRESSION: Indeterminate calcifications in the upper inner left breast which could be early vascular or dystrophic though are difficult to characterize due to limitations of this exam. There are no definite highly suspicious calcifications or mass. RECOMMENDATION: Diagnostic left breast mammogram in 6 months. Discussed with patient's husband the options for follow-up, biopsy, or excision. The patient would likely not be able to tolerate biopsy and husband/surgeon do not want to proceed with excision at this time. Therefore short-term follow-up is recommended. BI-RADS CATEGORY  3: Probably benign. Electronically Signed   By: Audie Pinto M.D.   On: 05/07/2021 14:26    ASSESSMENT & PLAN:  Carcinoma of overlapping sites of left breast in female, estrogen receptor negative ([place]) #Metastatic breast cancer ER [name] HER-2/neu positive. Apr 16, 2021--  CT scan chest and pelvis NED-  STABLE. sclerosis of  the manubrium. STABLE.   # Currently on Herceptin-perjeta. Labs today reviewed;  acceptable for treatment today.  Discussed option of subcu injection-declines.  Continue current therapy.   # Drop in ejection fraction-January 09, 2020 MUGA scan ejection fraction 47%; AUG 2021- MUGA scan- 61%. NOV 2021- EF-55% [2 months break- DEC-FEB, 2022]; July 2022- STABLE at 52.5%   # Left breast calcifications: [[name]; mammogram OCT, 2022]-stable indeterminate calcifications-no mass noted on the mammogram.  Given patient's overall borderline performance status/morbidities I think is reasonable to repeat mammogram in 6 months.  Discussed with Dr. Bary Castilla; agreement.  # dry eyes/ keratitis sicca- *herceptin-perjeta; not sure if casutive factor.  Continue moisturizing eyedrops-STABLE;  # Chronic back and hip pain/ gait instability-- On asper patch/ tramadol prn. STABLE;   # Elevated BG-post breakfast in the range of 199-; STABLE;  HbA1c-5.6; s/p evaluation with Joli.    DISPOSITION:  # Herceptin-Perjeta today # Follow up-in 3 weeks MD; labs-cbc/cmp; Herceptin-Perjeta-.B       No orders of the defined types were placed in this encounter.   All questions were answered. The patient knows to call the clinic with any problems, questions or concerns.      Cammie Sickle, MD 05/08/2021 1:15 PM

## 2021-05-08 NOTE — Assessment & Plan Note (Addendum)
#  Metastatic breast cancer ER [name] HER-2/neu positive. Apr 16, 2021--  CT scan chest and pelvis NED-  STABLE. sclerosis of the manubrium. STABLE.   # Currently on Herceptin-perjeta. Labs today reviewed;  acceptable for treatment today.  Discussed option of subcu injection-declines.  Continue current therapy.   # Drop in ejection fraction-January 09, 2020 MUGA scan ejection fraction 47%; AUG 2021- MUGA scan- 61%. NOV 2021- EF-55% [2 months break- DEC-FEB, 2022]; July 2022- STABLE at 52.5%   # Left breast calcifications: [[name]; mammogram OCT, 2022]-stable indeterminate calcifications-no mass noted on the mammogram.  Given patient's overall borderline performance status/morbidities I think is reasonable to repeat mammogram in 6 months.  Discussed with Dr. Bary Castilla; agreement.  # dry eyes/ keratitis sicca- *herceptin-perjeta; not sure if casutive factor.  Continue moisturizing eyedrops-STABLE;  # Chronic back and hip pain/ gait instability-- On asper patch/ tramadol prn. STABLE;   # Elevated BG-post breakfast in the range of 199-; STABLE;  HbA1c-5.6; s/p evaluation with Joli.    DISPOSITION:  # Herceptin-Perjeta today # Follow up-in 3 weeks MD; labs-cbc/cmp; Herceptin-Perjeta-.B

## 2021-05-08 NOTE — Patient Instructions (Signed)
[facility]  Discharge Instructions: Thank you for choosing [facility] to provide your oncology and hematology care.  If you have a lab appointment with the [facility], please go directly to the [facility] and check in at the registration area.  Wear comfortable clothing and clothing appropriate for easy access to any Portacath or PICC line.   We strive to give you quality time with your provider. You may need to reschedule your appointment if you arrive late (15 or more minutes).  Arriving late affects you and other patients whose appointments are after yours.  Also, if you miss three or more appointments without notifying the office, you may be dismissed from the clinic at the provider's discretion.      For prescription refill requests, have your pharmacy contact our office and allow 72 hours for refills to be completed.    Today you received the following chemotherapy and/or immunotherapy agents KANJINTI and PERJETA       To help prevent nausea and vomiting after your treatment, we encourage you to take your nausea medication as directed.  BELOW ARE SYMPTOMS THAT SHOULD BE REPORTED IMMEDIATELY: *FEVER GREATER THAN 100.4 F (38 C) OR HIGHER *CHILLS OR SWEATING *NAUSEA AND VOMITING THAT IS NOT CONTROLLED WITH YOUR NAUSEA MEDICATION *UNUSUAL SHORTNESS OF BREATH *UNUSUAL BRUISING OR BLEEDING *URINARY PROBLEMS (pain or burning when urinating, or frequent urination) *BOWEL PROBLEMS (unusual diarrhea, constipation, pain near the anus) TENDERNESS IN MOUTH AND THROAT WITH OR WITHOUT PRESENCE OF ULCERS (sore throat, sores in mouth, or a toothache) UNUSUAL RASH, SWELLING OR PAIN  UNUSUAL VAGINAL DISCHARGE OR ITCHING   Items with * indicate a potential emergency and should be followed up as soon as possible or go to the Emergency Department if any problems should occur.  Please show the CHEMOTHERAPY ALERT CARD or IMMUNOTHERAPY ALERT CARD  at check-in to the Emergency Department and triage nurse.  Should you have questions after your visit or need to cancel or reschedule your appointment, please contact [facility]  [phone] and follow the prompts.  Office hours are 8:00 a.m. to 4:30 p.m. Monday - Friday. Please note that voicemails left after 4:00 p.m. may not be returned until the following business day.  We are closed weekends and major holidays. You have access to a nurse at all times for urgent questions. Please call the main number to the clinic [phone] and follow the prompts.  For any non-urgent questions, you may also contact your provider using MyChart. We now offer e-Visits for anyone 79 and older to request care online for non-urgent symptoms. For details visit [email or web address].   Also download the MyChart app! Go to the app store, search "MyChart", open the app, select [facility], and log in with your MyChart username and password.  Due to Covid, a mask is required upon entering the hospital/clinic. If you do not have a mask, one will be given to you upon arrival. For doctor visits, patients may have 1 support person aged 8 or older with them. For treatment visits, patients cannot have anyone with them due to current Covid guidelines and our immunocompromised population.   Trastuzumab injection for infusion What is this medication? TRASTUZUMAB (tras TOO zoo mab) is a monoclonal antibody. It is used to treat breast cancer and stomach cancer. This medicine may be used for other purposes; ask your health care provider or pharmacist if you have questions. COMMON BRAND NAME(S): Herceptin, Belenda Cruise,  Garvin Fila What should I tell my care team before I take this medication? They need to know if you have any of these conditions: heart disease heart failure lung or breathing disease, like asthma an unusual or allergic reaction to trastuzumab, benzyl  alcohol, or other medications, foods, dyes, or preservatives pregnant or trying to get pregnant breast-feeding How should I use this medication? This drug is given as an infusion into a vein. It is administered in a hospital or clinic by a specially trained health care professional. Talk to your pediatrician regarding the use of this medicine in children. This medicine is not approved for use in children. Overdosage: If you think you have taken too much of this medicine contact a poison control center or emergency room at once. NOTE: This medicine is only for you. Do not share this medicine with others. What if I miss a dose? It is important not to miss a dose. Call your doctor or health care professional if you are unable to keep an appointment. What may interact with this medication? This medicine may interact with the following medications: certain types of chemotherapy, such as daunorubicin, doxorubicin, epirubicin, and idarubicin This list may not describe all possible interactions. Give your health care provider a list of all the medicines, herbs, non-prescription drugs, or dietary supplements you use. Also tell them if you smoke, drink alcohol, or use illegal drugs. Some items may interact with your medicine. What should I watch for while using this medication? Visit your doctor for checks on your progress. Report any side effects. Continue your course of treatment even though you feel ill unless your doctor tells you to stop. Call your doctor or health care professional for advice if you get a fever, chills or sore throat, or other symptoms of a cold or flu. Do not treat yourself. Try to avoid being around people who are sick. You may experience fever, chills and shaking during your first infusion. These effects are usually mild and can be treated with other medicines. Report any side effects during the infusion to your health care professional. Fever and chills usually do not happen with  later infusions. Do not become pregnant while taking this medicine or for 7 months after stopping it. Women should inform their doctor if they wish to become pregnant or think they might be pregnant. Women of child-bearing potential will need to have a negative pregnancy test before starting this medicine. There is a potential for serious side effects to an unborn child. Talk to your health care professional or pharmacist for more information. Do not breast-feed an infant while taking this medicine or for 7 months after stopping it. Women must use effective birth control with this medicine. What side effects may I notice from receiving this medication? Side effects that you should report to your doctor or health care professional as soon as possible: allergic reactions like skin rash, itching or hives, swelling of the face, lips, or tongue chest pain or palpitations cough dizziness feeling faint or lightheaded, falls fever general ill feeling or flu-like symptoms signs of worsening heart failure like breathing problems; swelling in your legs and feet unusually weak or tired Side effects that usually do not require medical attention (report to your doctor or health care professional if they continue or are bothersome): bone pain changes in taste diarrhea joint pain nausea/vomiting weight loss This list may not describe all possible side effects. Call your doctor for medical advice about side effects. You may report  side effects to FDA at 1-800-FDA-1088. Where should I keep my medication? This drug is given in a hospital or clinic and will not be stored at home. NOTE: This sheet is a summary. It may not cover all possible information. If you have questions about this medicine, talk to your doctor, pharmacist, or health care provider.  2022 Elsevier/Gold Standard (2016-07-08 14:37:52)  Pertuzumab injection What is this medication? PERTUZUMAB (per TOOZ ue mab) is a monoclonal antibody. It  is used to treat breast cancer. This medicine may be used for other purposes; ask your health care provider or pharmacist if you have questions. COMMON BRAND NAME(S): PERJETA What should I tell my care team before I take this medication? They need to know if you have any of these conditions: heart disease heart failure high blood pressure history of irregular heart beat recent or ongoing radiation therapy an unusual or allergic reaction to pertuzumab, other medicines, foods, dyes, or preservatives pregnant or trying to get pregnant breast-feeding How should I use this medication? This medicine is for infusion into a vein. It is given by a health care professional in a hospital or clinic setting. Talk to your pediatrician regarding the use of this medicine in children. Special care may be needed. Overdosage: If you think you have taken too much of this medicine contact a poison control center or emergency room at once. NOTE: This medicine is only for you. Do not share this medicine with others. What if I miss a dose? It is important not to miss your dose. Call your doctor or health care professional if you are unable to keep an appointment. What may interact with this medication? Interactions are not expected. Give your health care provider a list of all the medicines, herbs, non-prescription drugs, or dietary supplements you use. Also tell them if you smoke, drink alcohol, or use illegal drugs. Some items may interact with your medicine. This list may not describe all possible interactions. Give your health care provider a list of all the medicines, herbs, non-prescription drugs, or dietary supplements you use. Also tell them if you smoke, drink alcohol, or use illegal drugs. Some items may interact with your medicine. What should I watch for while using this medication? Your condition will be monitored carefully while you are receiving this medicine. Report any side effects. Continue your  course of treatment even though you feel ill unless your doctor tells you to stop. Do not become pregnant while taking this medicine or for 7 months after stopping it. Women should inform their doctor if they wish to become pregnant or think they might be pregnant. Women of child-bearing potential will need to have a negative pregnancy test before starting this medicine. There is a potential for serious side effects to an unborn child. Talk to your health care professional or pharmacist for more information. Do not breast-feed an infant while taking this medicine or for 7 months after stopping it. Women must use effective birth control with this medicine. Call your doctor or health care professional for advice if you get a fever, chills or sore throat, or other symptoms of a cold or flu. Do not treat yourself. Try to avoid being around people who are sick. You may experience fever, chills, and headache during the infusion. Report any side effects during the infusion to your health care professional. What side effects may I notice from receiving this medication? Side effects that you should report to your doctor or health care professional as soon as  possible: breathing problems chest pain or palpitations dizziness feeling faint or lightheaded fever or chills skin rash, itching or hives sore throat swelling of the face, lips, or tongue swelling of the legs or ankles unusually weak or tired Side effects that usually do not require medical attention (report to your doctor or health care professional if they continue or are bothersome): diarrhea hair loss nausea, vomiting tiredness This list may not describe all possible side effects. Call your doctor for medical advice about side effects. You may report side effects to FDA at 1-800-FDA-1088. Where should I keep my medication? This drug is given in a hospital or clinic and will not be stored at home. NOTE: This sheet is a summary. It may not  cover all possible information. If you have questions about this medicine, talk to your doctor, pharmacist, or health care provider.  2022 Elsevier/Gold Standard (2015-08-16 12:08:50)

## 2021-05-09 ENCOUNTER — Encounter: Payer: Self-pay | Admitting: Internal Medicine

## 2021-05-14 ENCOUNTER — Encounter: Payer: Self-pay | Admitting: Internal Medicine

## 2021-05-29 ENCOUNTER — Inpatient Hospital Stay: Payer: Medicare Other

## 2021-05-29 ENCOUNTER — Inpatient Hospital Stay: Payer: Medicare Other | Attending: Internal Medicine

## 2021-05-29 ENCOUNTER — Other Ambulatory Visit: Payer: Self-pay

## 2021-05-29 ENCOUNTER — Inpatient Hospital Stay (HOSPITAL_BASED_OUTPATIENT_CLINIC_OR_DEPARTMENT_OTHER): Payer: Medicare Other | Admitting: Internal Medicine

## 2021-05-29 VITALS — BP 122/60 | HR 72 | Temp 96.9°F | Resp 18 | Wt 173.0 lb

## 2021-05-29 VITALS — BP 117/66 | HR 76

## 2021-05-29 DIAGNOSIS — C50812 Malignant neoplasm of overlapping sites of left female breast: Secondary | ICD-10-CM | POA: Insufficient documentation

## 2021-05-29 DIAGNOSIS — F0390 Unspecified dementia without behavioral disturbance: Secondary | ICD-10-CM | POA: Diagnosis not present

## 2021-05-29 DIAGNOSIS — Z171 Estrogen receptor negative status [ER-]: Secondary | ICD-10-CM

## 2021-05-29 DIAGNOSIS — Z5112 Encounter for antineoplastic immunotherapy: Secondary | ICD-10-CM | POA: Insufficient documentation

## 2021-05-29 DIAGNOSIS — I427 Cardiomyopathy due to drug and external agent: Secondary | ICD-10-CM

## 2021-05-29 DIAGNOSIS — R6889 Other general symptoms and signs: Secondary | ICD-10-CM | POA: Diagnosis not present

## 2021-05-29 DIAGNOSIS — T451X5A Adverse effect of antineoplastic and immunosuppressive drugs, initial encounter: Secondary | ICD-10-CM | POA: Diagnosis not present

## 2021-05-29 DIAGNOSIS — M3501 Sicca syndrome with keratoconjunctivitis: Secondary | ICD-10-CM | POA: Diagnosis not present

## 2021-05-29 LAB — CBC WITH DIFFERENTIAL/PLATELET
Abs Immature Granulocytes: 0.02 10*3/uL (ref 0.00–0.07)
Basophils Absolute: 0 10*3/uL (ref 0.0–0.1)
Basophils Relative: 0 %
Eosinophils Absolute: 0.1 10*3/uL (ref 0.0–0.5)
Eosinophils Relative: 2 %
HCT: 43 % (ref 36.0–46.0)
Hemoglobin: 13.6 g/dL (ref 12.0–15.0)
Immature Granulocytes: 0 %
Lymphocytes Relative: 16 %
Lymphs Abs: 1.1 10*3/uL (ref 0.7–4.0)
MCH: 29.3 pg (ref 26.0–34.0)
MCHC: 31.6 g/dL (ref 30.0–36.0)
MCV: 92.7 fL (ref 80.0–100.0)
Monocytes Absolute: 0.4 10*3/uL (ref 0.1–1.0)
Monocytes Relative: 6 %
Neutro Abs: 4.8 10*3/uL (ref 1.7–7.7)
Neutrophils Relative %: 76 %
Platelets: 289 10*3/uL (ref 150–400)
RBC: 4.64 MIL/uL (ref 3.87–5.11)
RDW: 13.7 % (ref 11.5–15.5)
WBC: 6.4 10*3/uL (ref 4.0–10.5)
nRBC: 0 % (ref 0.0–0.2)

## 2021-05-29 LAB — COMPREHENSIVE METABOLIC PANEL
ALT: 16 U/L (ref 0–44)
AST: 24 U/L (ref 15–41)
Albumin: 4.2 g/dL (ref 3.5–5.0)
Alkaline Phosphatase: 55 U/L (ref 38–126)
Anion gap: 7 (ref 5–15)
BUN: 17 mg/dL (ref 8–23)
CO2: 29 mmol/L (ref 22–32)
Calcium: 8.5 mg/dL — ABNORMAL LOW (ref 8.9–10.3)
Chloride: 99 mmol/L (ref 98–111)
Creatinine, Ser: 0.83 mg/dL (ref 0.44–1.00)
GFR, Estimated: >60 mL/min (ref >60)
Glucose, Bld: 187 mg/dL — ABNORMAL HIGH (ref 70–99)
Potassium: 3.5 mmol/L (ref 3.5–5.1)
Sodium: 135 mmol/L (ref 135–145)
Total Bilirubin: 0.5 mg/dL (ref 0.3–1.2)
Total Protein: 7.2 g/dL (ref 6.5–8.1)

## 2021-05-29 MED ORDER — ACETAMINOPHEN 325 MG PO TABS
650.0000 mg | ORAL_TABLET | Freq: Once | ORAL | Status: AC
  Administered 2021-05-29: 650 mg via ORAL
  Filled 2021-05-29: qty 2

## 2021-05-29 MED ORDER — HEPARIN SOD (PORK) LOCK FLUSH 100 UNIT/ML IV SOLN
500.0000 [IU] | Freq: Once | INTRAVENOUS | Status: AC | PRN
  Administered 2021-05-29: 500 [IU]
  Filled 2021-05-29: qty 5

## 2021-05-29 MED ORDER — SODIUM CHLORIDE 0.9 % IV SOLN
Freq: Once | INTRAVENOUS | Status: AC
  Filled 2021-05-29: qty 250

## 2021-05-29 MED ORDER — SODIUM CHLORIDE 0.9 % IV SOLN
420.0000 mg | Freq: Once | INTRAVENOUS | Status: AC
  Administered 2021-05-29: 420 mg via INTRAVENOUS
  Filled 2021-05-29: qty 14

## 2021-05-29 MED ORDER — TRASTUZUMAB-ANNS CHEMO 150 MG IV SOLR
450.0000 mg | Freq: Once | INTRAVENOUS | Status: AC
  Administered 2021-05-29: 450 mg via INTRAVENOUS
  Filled 2021-05-29: qty 21.43

## 2021-05-29 MED ORDER — DIPHENHYDRAMINE HCL 50 MG/ML IJ SOLN
12.5000 mg | Freq: Once | INTRAMUSCULAR | Status: AC
  Administered 2021-05-29: 12.5 mg via INTRAVENOUS
  Filled 2021-05-29: qty 1

## 2021-05-29 MED ORDER — SODIUM CHLORIDE 0.9% FLUSH
10.0000 mL | INTRAVENOUS | Status: DC | PRN
  Administered 2021-05-29: 10 mL
  Filled 2021-05-29: qty 10

## 2021-05-29 NOTE — Assessment & Plan Note (Signed)
#  Metastatic breast cancer ER [name] HER-2/neu positive. Apr 16, 2021--  CT scan chest and pelvis NED-  STABLE. sclerosis of the manubrium. STABLE.   # Currently on Herceptin-perjeta. Labs today reviewed;  acceptable for treatment today.  Continue current therapy.   # Drop in ejection fraction-January 09, 2020 MUGA scan ejection fraction 47%; AUG 2021- MUGA scan- 61%. NOV 2021- EF-55% [2 months break- DEC-FEB, 2022]; July 2022- STABLE at 52.5%   # Left breast calcifications: [[name]; mammogram OCT, 2022]-stable indeterminate calcifications-no mass noted on the mammogram.  Given patient's overall borderline performance status/morbidities I think is reasonable to repeat mammogram in 6 months.  Discussed with Dr. Bary Castilla; agreement.  # dry eyes/ keratitis sicca- *herceptin-perjeta; not sure if casutive factor.  Continue moisturizing eyedrops-STABLE.   # Chronic back and hip pain/ gait instability-- On asper patch/ tramadol prn- STABLE  # Elevated BG-post breakfast in the range of 187-;STABLE.APRIL 2022-  HbA1c-5.6.    DISPOSITION:  # Herceptin-Perjeta today # Follow up-in 3 weeks MD; labs-cbc/cmp; Herceptin-Perjeta; MUGA scan prior--.B

## 2021-05-29 NOTE — Progress Notes (Signed)
[name] OFFICE PROGRESS NOTE  Patient Care Team: [name], MD as PCP - General (Internal Medicine) Bary Castilla, [name], MD (General Surgery) Requested, Self Cammie Sickle, MD as Consulting Physician (Internal Medicine)  Cancer Staging No matching staging information was found for the patient.   Oncology History Overview Note  # June 2016- LEFT BREAST CA;  [name] ; [name] ; [name]; Her 2 Neu POS; [place] from July OF 2016; [name]; adjuvant Herceptin [ Finished July 2017]; AUG 2017- Neratinib x5 days; DISCON sec to diarrhea  # MID OCT 2018- Right breast mass-Bx- ER/PR-NEG; Her 2 NEU POSITIVE 1~2.5cm;  [?NEW primary]  # MID-OCT 2018-METASTATIC RECURRENT-oh sternal mass; Left Ax LN [Bx]/periportal LN  # OCT 25th 2018- TAXOL-HERCEPTIN-PERJETA; Jan 2019- CT PR; continue HP only; on HOLD since NOV 2019--sec to drop in EF/Sieziues  # July 15th 2020- RE-STARTED HP   -------------------------------------------------------------------  # MUGA scan- July 28th 2017- 67%.  December 2019-EF 53%; January 2020 EF-52%  # chronic gait/balance issues  # Seizures/petit-mal/ Dr.Shah-Keppra [name] [id number]]  # June 2017- left breast Bx- fat necrosis [[name]  # july 2017-  BRCA 1& 2- NEG.   MOLECULAR TESTING- F ONE- TPS- 0%;  ERB2 amplification; PI3K/RET amplification Others**  # PALLIATIVE CARE: P  --------------------------------------------------    DIAGNOSIS: [ OCT 6203]- REC/MET- BREAST CA ER/PR-NEG; her 2 POS  STAGE: 4  ;GOALS: Palliative  CURRENT/MOST RECENT THERAPY- Herceptin-Perjeta [C]    Carcinoma of overlapping sites of left breast in female, estrogen receptor negative ([place])     INTERVAL HISTORY: Patient a poor historian given dementia.  Patient in a wheelchair.  Patient is accompanied by her husband today.  [name] 80 y.o.  female pleasant patient above history of metastatic  breast cancer on Herceptin plus Perjeta-is here for follow-up.  Denies any worsening joint pains or back pain.  Chronic itchy eyes not any worse.  No headaches.  No falls.  Review of Systems  Unable to perform ROS: Dementia   PAST MEDICAL HISTORY :  Past Medical History:  Diagnosis Date  . Arthritis   . 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 Breast cancer ([place]) 2018   left breast; surgery 01/11/15 with Dr. Bary Castilla; path with invasive mammary and DCIS  . Breast cancer of upper-inner quadrant of left female breast ([place]) 12/15/14   Completed radiation end of December and finished chemotherapy 2 weeks ago, Left breast invasive mammary carcinoma, T1cN85mc (1.5 cm); Grade 3, [name], [name], [name], .  .Marland KitchenCataract    bilat   . DDD (degenerative disc disease), lumbar    Lumbar, previously evaluated by Dr. [place]arnestine Leys . D[place]   . Fibrocystic breast disease    prior biopsy  . Glaucoma   . 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 Hearing loss   . History of cancer chemotherapy   . History of radiation therapy   . Hyperlipidemia, unspecified   . Imbalance   . Memory impairment    seen by Dr SManuella Ghazi possible post crainiotomy from radiation  . [place] ([place]    Left cavernous sinus meningioma, [facility].  . Numbness and tingling    right hand   . Osteoarthritis   . Osteoporosis, post-menopausal   . Personal history of chemotherapy   .  Personal history of radiation therapy   . Shoulder pain, right   . Wears glasses     PAST SURGICAL HISTORY :   Past Surgical History:  Procedure Laterality Date  . APPENDECTOMY  1950  . BRAIN SURGERY  1995   left frontal/temporal  . BREAST BIOPSY Left 12/15/14   confirmed DCIS  . BREAST BIOPSY Left 01/08/2015   Procedure: BREAST BIOPSY WITH NEEDLE LOCALIZATION;  Surgeon: [name], MD;  Location: [facility] ORS;  Service: General;   Laterality: Left;  . BREAST EXCISIONAL BIOPSY Left 1997  . BREAST LUMPECTOMY Left 01/08/2015   Procedure: LUMPECTOMY;  Surgeon: [name], MD;  Location: [facility];  Service: General;  Laterality: Left;  . COLONOSCOPY  2010   Dr. [name]  . ORIF ANKLE FRACTURE Right 08/05/2017   Procedure: OPEN REDUCTION INTERNAL FIXATION (ORIF) ANKLE FRACTURE;  Surgeon: Earnestine Leys, MD;  Location: [facility];  Service: Orthopedics;  Laterality: Right;  . PORTACATH PLACEMENT Right 01/16/2015   Procedure: INSERTION PORT-A-CATH;  Surgeon: [name], MD;  Location: [facility];  Service: General;  Laterality: Right;  . SENTINEL NODE BIOPSY Left 01/16/2015   Procedure: SENTINEL NODE BIOPSY;  Surgeon: [name], MD;  Location: [facility];  Service: General;  Laterality: Left;  . TOTAL HIP ARTHROPLASTY Right 09/04/2015   Procedure: RIGHT TOTAL HIP ARTHROPLASTY ANTERIOR APPROACH;  Surgeon: [name], MD;  Location: [facility];  Service: Orthopedics;  Laterality: Right;    FAMILY HISTORY :   Family History  Problem Relation Age of Onset  . Breast cancer Sister 14  . Addison's disease Mother   . Hyperthyroidism Mother   . Osteoarthritis Mother   . Stroke Father   . Heart disease Father   . High blood pressure Father     SOCIAL HISTORY:   Social History   Tobacco Use  . Smoking status: Former    Packs/day: 0.25    Years: 5.00    Pack years: 1.25    Types: Cigarettes    Quit date: 07/28/1972    Years since quitting: 48.8  . Smokeless tobacco: Never  Vaping Use  . Vaping Use: Never used  Substance Use Topics  . Alcohol use: Yes    Alcohol/week: 1.0 - 2.0 standard drink    Types: 1 - 2 Glasses of wine per week    Comment: 1 Glass Wine / Night  . Drug use: No    ALLERGIES:  is allergic to donepezil.  MEDICATIONS:  Current Outpatient Medications  Medication Sig Dispense Refill  . acetaminophen (TYLENOL) 325 MG tablet Take 650 mg by mouth every 4 (four) hours as needed. Pain / increased  temp.    Marland Kitchen albuterol (VENTOLIN HFA) 108 (90 Base) MCG/ACT inhaler Inhale 2 puffs into the lungs every 4 (four) hours as needed for wheezing or shortness of breath. 18 g 0  . ARTIFICIAL TEARS 1 % ophthalmic solution Place 1 drop into both eyes daily.    Marland Kitchen ascorbic acid (VITAMIN C) 500 MG tablet Take 1 tablet by mouth daily.    . Calcium Carbonate-Vitamin D (CALTRATE 600+D PO) Take 1 tablet by mouth daily.    . celecoxib (CELEBREX) 200 MG capsule Take 200 mg by mouth daily.     Marland Kitchen levETIRAcetam (KEPPRA) 250 MG tablet 1 tablet daily at bedtime  5  . traMADol (ULTRAM) 50 MG tablet Take 1 tablet (50 mg total) by mouth 2 (two) times daily as needed for moderate pain. 60 tablet 0  .  Travoprost, BAK Free, (TRAVATAN) 0.004 % SOLN ophthalmic solution travoprost 0.004 % eye drops    . vitamin E 400 UNIT capsule Take 400 Units by mouth daily.      No current facility-administered medications for this visit.   Facility-Administered Medications Ordered in Other Visits  Medication Dose Route Frequency Provider Last Rate Last Admin  . heparin lock flush 100 unit/mL  500 Units Intracatheter Once PRN Charlaine Dalton R, MD      . sodium chloride flush (NS) 0.9 % injection 10 mL  10 mL Intravenous PRN Cammie Sickle, MD   10 mL at 07/07/18 0830  . sodium chloride flush (NS) 0.9 % injection 10 mL  10 mL Intracatheter PRN Cammie Sickle, MD   10 mL at 05/29/21 0900    PHYSICAL EXAMINATION: ECOG PERFORMANCE STATUS: 2 - Symptomatic, <50% confined to bed  BP 122/60 (BP Location: Right Arm, Patient Position: Sitting)   Pulse 72   Temp (!) 96.9 F (36.1 C)   Resp 18   Wt 173 lb (78.5 kg)   SpO2 99%   BMI 29.70 kg/m   Filed Weights   05/29/21 0931  Weight: 173 lb (78.5 kg)     Physical Exam Constitutional:      Comments: Patient is accompanied by husband.  In a wheelchair.  HENT:     Head: Normocephalic and atraumatic.     Mouth/Throat:     Pharynx: No oropharyngeal exudate.  Eyes:      Pupils: Pupils are equal, round, and reactive to light.  Cardiovascular:     Rate and Rhythm: Normal rate and regular rhythm.  Pulmonary:     Effort: Pulmonary effort is normal. No respiratory distress.     Breath sounds: Normal breath sounds. No wheezing.  Abdominal:     General: Bowel sounds are normal. There is no distension.     Palpations: Abdomen is soft. There is no mass.     Tenderness: There is no abdominal tenderness. There is no guarding or rebound.  Musculoskeletal:        General: No tenderness. Normal range of motion.     Cervical back: Normal range of motion and neck supple.  Skin:    General: Skin is warm.  Neurological:     Mental Status: She is alert and oriented to person, place, and time.  Psychiatric:        Mood and Affect: Affect normal.    LABORATORY DATA:  I have reviewed the data as listed    Component Value Date/Time   NA 135 05/29/2021 0903   K 3.5 05/29/2021 0903   CL 99 05/29/2021 0903   CO2 29 05/29/2021 0903   GLUCOSE 187 (H) 05/29/2021 0903   BUN 17 05/29/2021 0903   CREATININE 0.83 05/29/2021 0903   CALCIUM 8.5 (L) 05/29/2021 0903   PROT 7.2 05/29/2021 0903   ALBUMIN 4.2 05/29/2021 0903   AST 24 05/29/2021 0903   ALT 16 05/29/2021 0903   ALKPHOS 55 05/29/2021 0903   BILITOT 0.5 05/29/2021 0903   GFRNONAA >60 05/29/2021 0903   GFRAA >60 04/16/2020 0814    No results found for: SPEP, UPEP  Lab Results  Component Value Date   WBC 6.4 05/29/2021   NEUTROABS 4.8 05/29/2021   HGB 13.6 05/29/2021   HCT 43.0 05/29/2021   MCV 92.7 05/29/2021   PLT 289 05/29/2021      Chemistry      Component Value Date/Time   NA 135 05/29/2021  0903   K 3.5 05/29/2021 0903   CL 99 05/29/2021 0903   CO2 29 05/29/2021 0903   BUN 17 05/29/2021 0903   CREATININE 0.83 05/29/2021 0903      Component Value Date/Time   CALCIUM 8.5 (L) 05/29/2021 0903   ALKPHOS 55 05/29/2021 0903   AST 24 05/29/2021 0903   ALT 16 05/29/2021 0903   BILITOT 0.5  05/29/2021 0903       RADIOGRAPHIC STUDIES: I have personally reviewed the radiological images as listed and agreed with the findings in the report. No results found.   ASSESSMENT & PLAN:  Carcinoma of overlapping sites of left breast in female, estrogen receptor negative ([place]) #Metastatic breast cancer ER [name] HER-2/neu positive. Apr 16, 2021--  CT scan chest and pelvis NED-  STABLE. sclerosis of the manubrium. STABLE.   # Currently on Herceptin-perjeta. Labs today reviewed;  acceptable for treatment today.  Continue current therapy.   # Drop in ejection fraction-January 09, 2020 MUGA scan ejection fraction 47%; AUG 2021- MUGA scan- 61%. NOV 2021- EF-55% [2 months break- DEC-FEB, 2022]; July 2022- STABLE at 52.5%   # Left breast calcifications: [[name]; mammogram OCT, 2022]-stable indeterminate calcifications-no mass noted on the mammogram.  Given patient's overall borderline performance status/morbidities I think is reasonable to repeat mammogram in 6 months.  Discussed with Dr. Bary Castilla; agreement.  # dry eyes/ keratitis sicca- *herceptin-perjeta; not sure if casutive factor.  Continue moisturizing eyedrops-STABLE.   # Chronic back and hip pain/ gait instability-- On asper patch/ tramadol prn- STABLE  # Elevated BG-post breakfast in the range of 187-;STABLE.APRIL 2022-  HbA1c-5.6.    DISPOSITION:  # Herceptin-Perjeta today # Follow up-in 3 weeks MD; labs-cbc/cmp; Herceptin-Perjeta; MUGA scan prior--.B       Orders Placed This Encounter  Procedures  . NM Cardiac [facility] Rest    Standing Status:   Future    Standing Expiration Date:   05/29/2022    Order Specific Question:   Will [facility] be the location of this test?    Answer:   Yes    Order Specific Question:   Please indicate who you request to read the nuc med / echo results.    Answer:   [facility]    Order Specific Question:   caspofungin (CANCIDAS) frequency    Answer:   Q 24H    Order Specific  Question:   Preferred imaging location?    Answer:   [facility]    Order Specific Question:   Radiology Contrast Protocol - do NOT remove file path    Answer:   [email or web address]     All questions were answered. The patient knows to call the clinic with any problems, questions or concerns.      Cammie Sickle, MD 05/29/2021 12:54 PM

## 2021-05-29 NOTE — Progress Notes (Signed)
Patient reports balance issues denies feeling dizzy

## 2021-05-29 NOTE — Patient Instructions (Signed)
[facility]  Discharge Instructions: Thank you for choosing [facility] to provide your oncology and hematology care.  If you have a lab appointment with the Cancer Center, please go directly to the Cancer Center and check in at the registration area.  Wear comfortable clothing and clothing appropriate for easy access to any Portacath or PICC line.   We strive to give you quality time with your provider. You may need to reschedule your appointment if you arrive late (15 or more minutes).  Arriving late affects you and other patients whose appointments are after yours.  Also, if you miss three or more appointments without notifying the office, you may be dismissed from the clinic at the provider's discretion.      For prescription refill requests, have your pharmacy contact our office and allow 72 hours for refills to be completed.      To help prevent nausea and vomiting after your treatment, we encourage you to take your nausea medication as directed.  BELOW ARE SYMPTOMS THAT SHOULD BE REPORTED IMMEDIATELY: *FEVER GREATER THAN 100.4 F (38 C) OR HIGHER *CHILLS OR SWEATING *NAUSEA AND VOMITING THAT IS NOT CONTROLLED WITH YOUR NAUSEA MEDICATION *UNUSUAL SHORTNESS OF BREATH *UNUSUAL BRUISING OR BLEEDING *URINARY PROBLEMS (pain or burning when urinating, or frequent urination) *BOWEL PROBLEMS (unusual diarrhea, constipation, pain near the anus) TENDERNESS IN MOUTH AND THROAT WITH OR WITHOUT PRESENCE OF ULCERS (sore throat, sores in mouth, or a toothache) UNUSUAL RASH, SWELLING OR PAIN  UNUSUAL VAGINAL DISCHARGE OR ITCHING   Items with * indicate a potential emergency and should be followed up as soon as possible or go to the Emergency Department if any problems should occur.  Please show the CHEMOTHERAPY ALERT CARD or IMMUNOTHERAPY ALERT CARD at check-in to the Emergency Department and triage nurse.  Should you have questions after your  visit or need to cancel or reschedule your appointment, please contact [facility]  [phone] and follow the prompts.  Office hours are 8:00 a.m. to 4:30 p.m. Monday - Friday. Please note that voicemails left after 4:00 p.m. may not be returned until the following business day.  We are closed weekends and major holidays. You have access to a nurse at all times for urgent questions. Please call the main number to the clinic [phone] and follow the prompts.  For any non-urgent questions, you may also contact your provider using MyChart. We now offer e-Visits for anyone 18 and older to request care online for non-urgent symptoms. For details visit [email or web address].   Also download the MyChart app! Go to the app store, search "MyChart", open the app, select [facility], and log in with your MyChart username and password.  Due to Covid, a mask is required upon entering the hospital/clinic. If you do not have a mask, one will be given to you upon arrival. For doctor visits, patients may have 1 support person aged 18 or older with them. For treatment visits, patients cannot have anyone with them due to current Covid guidelines and our immunocompromised population.  

## 2021-06-11 ENCOUNTER — Other Ambulatory Visit: Payer: Self-pay

## 2021-06-11 ENCOUNTER — Encounter
Admission: RE | Admit: 2021-06-11 | Discharge: 2021-06-11 | Disposition: A | Discharge status: Home / Self Care | Payer: Medicare Other | Source: Ambulatory Visit | Attending: Internal Medicine | Admitting: Internal Medicine

## 2021-06-11 DIAGNOSIS — Z171 Estrogen receptor negative status [ER-]: Secondary | ICD-10-CM | POA: Insufficient documentation

## 2021-06-11 DIAGNOSIS — T451X5A Adverse effect of antineoplastic and immunosuppressive drugs, initial encounter: Secondary | ICD-10-CM | POA: Insufficient documentation

## 2021-06-11 DIAGNOSIS — I427 Cardiomyopathy due to drug and external agent: Secondary | ICD-10-CM | POA: Insufficient documentation

## 2021-06-11 DIAGNOSIS — C50812 Malignant neoplasm of overlapping sites of left female breast: Secondary | ICD-10-CM | POA: Diagnosis present

## 2021-06-11 MED ORDER — TECHNETIUM TC 99M-LABELED RED BLOOD CELLS IV KIT
20.0000 | PACK | Freq: Once | INTRAVENOUS | Status: AC | PRN
  Administered 2021-06-11: 21 via INTRAVENOUS

## 2021-06-19 ENCOUNTER — Other Ambulatory Visit: Payer: Self-pay

## 2021-06-19 ENCOUNTER — Inpatient Hospital Stay: Payer: Medicare Other

## 2021-06-19 ENCOUNTER — Inpatient Hospital Stay (HOSPITAL_BASED_OUTPATIENT_CLINIC_OR_DEPARTMENT_OTHER): Payer: Medicare Other | Admitting: Internal Medicine

## 2021-06-19 VITALS — BP 115/68 | HR 82 | Temp 98.3°F | Resp 18 | Wt 173.0 lb

## 2021-06-19 DIAGNOSIS — C50812 Malignant neoplasm of overlapping sites of left female breast: Secondary | ICD-10-CM | POA: Diagnosis not present

## 2021-06-19 DIAGNOSIS — Z171 Estrogen receptor negative status [ER-]: Secondary | ICD-10-CM

## 2021-06-19 DIAGNOSIS — Z5112 Encounter for antineoplastic immunotherapy: Secondary | ICD-10-CM | POA: Diagnosis not present

## 2021-06-19 LAB — CBC WITH DIFFERENTIAL/PLATELET
Abs Immature Granulocytes: 0.01 10*3/uL (ref 0.00–0.07)
Basophils Absolute: 0 10*3/uL (ref 0.0–0.1)
Basophils Relative: 1 %
Eosinophils Absolute: 0.1 10*3/uL (ref 0.0–0.5)
Eosinophils Relative: 2 %
HCT: 44 % (ref 36.0–46.0)
Hemoglobin: 14 g/dL (ref 12.0–15.0)
Immature Granulocytes: 0 %
Lymphocytes Relative: 23 %
Lymphs Abs: 1.3 10*3/uL (ref 0.7–4.0)
MCH: 29.6 pg (ref 26.0–34.0)
MCHC: 31.8 g/dL (ref 30.0–36.0)
MCV: 93 fL (ref 80.0–100.0)
Monocytes Absolute: 0.3 10*3/uL (ref 0.1–1.0)
Monocytes Relative: 5 %
Neutro Abs: 4 10*3/uL (ref 1.7–7.7)
Neutrophils Relative %: 69 %
Platelets: 281 10*3/uL (ref 150–400)
RBC: 4.73 MIL/uL (ref 3.87–5.11)
RDW: 13.5 % (ref 11.5–15.5)
WBC: 5.8 10*3/uL (ref 4.0–10.5)
nRBC: 0 % (ref 0.0–0.2)

## 2021-06-19 LAB — COMPREHENSIVE METABOLIC PANEL
ALT: 18 U/L (ref 0–44)
AST: 26 U/L (ref 15–41)
Albumin: 4 g/dL (ref 3.5–5.0)
Alkaline Phosphatase: 56 U/L (ref 38–126)
Anion gap: 9 (ref 5–15)
BUN: 16 mg/dL (ref 8–23)
CO2: 27 mmol/L (ref 22–32)
Calcium: 8.5 mg/dL — ABNORMAL LOW (ref 8.9–10.3)
Chloride: 99 mmol/L (ref 98–111)
Creatinine, Ser: 0.87 mg/dL (ref 0.44–1.00)
GFR, Estimated: >60 mL/min (ref >60)
Glucose, Bld: 188 mg/dL — ABNORMAL HIGH (ref 70–99)
Potassium: 3.5 mmol/L (ref 3.5–5.1)
Sodium: 135 mmol/L (ref 135–145)
Total Bilirubin: 0.3 mg/dL (ref 0.3–1.2)
Total Protein: 7.1 g/dL (ref 6.5–8.1)

## 2021-06-19 MED ORDER — ACETAMINOPHEN 325 MG PO TABS
650.0000 mg | ORAL_TABLET | Freq: Once | ORAL | Status: AC
  Administered 2021-06-19: 650 mg via ORAL
  Filled 2021-06-19: qty 2

## 2021-06-19 MED ORDER — TRASTUZUMAB-ANNS CHEMO 150 MG IV SOLR
450.0000 mg | Freq: Once | INTRAVENOUS | Status: AC
  Administered 2021-06-19: 450 mg via INTRAVENOUS
  Filled 2021-06-19: qty 21.43

## 2021-06-19 MED ORDER — SODIUM CHLORIDE 0.9 % IV SOLN
Freq: Once | INTRAVENOUS | Status: AC
Start: 2021-06-19 — End: 2021-06-19
  Filled 2021-06-19: qty 250

## 2021-06-19 MED ORDER — HEPARIN SOD (PORK) LOCK FLUSH 100 UNIT/ML IV SOLN
250.0000 [IU] | Freq: Once | INTRAVENOUS | Status: AC | PRN
  Filled 2021-06-19: qty 5

## 2021-06-19 MED ORDER — DIPHENHYDRAMINE HCL 50 MG/ML IJ SOLN
12.5000 mg | Freq: Once | INTRAMUSCULAR | Status: AC
  Administered 2021-06-19: 12.5 mg via INTRAVENOUS
  Filled 2021-06-19: qty 1

## 2021-06-19 MED ORDER — HEPARIN SOD (PORK) LOCK FLUSH 100 UNIT/ML IV SOLN
INTRAVENOUS | Status: AC
  Administered 2021-06-19: 500 [IU]
  Filled 2021-06-19: qty 5

## 2021-06-19 MED ORDER — SODIUM CHLORIDE 0.9 % IV SOLN
420.0000 mg | Freq: Once | INTRAVENOUS | Status: AC
  Administered 2021-06-19: 420 mg via INTRAVENOUS
  Filled 2021-06-19: qty 14

## 2021-06-19 NOTE — Progress Notes (Signed)
[name]  Patient Care Team: [name], MD as PCP - General (Internal Medicine) Bary Castilla, [name], MD (General Surgery) Requested, Self Cammie Sickle, MD as Consulting Physician (Internal Medicine)   Cancer Staging  No matching staging information was found for the patient.   Oncology History Overview [name]  # June 2016- LEFT BREAST CA;  [name] ; [name] ; [name]; Her 2 Neu POS; [place] from July OF 2016; [name]; adjuvant Herceptin [ Finished July 2017]; AUG 2017- Neratinib x5 days; DISCON sec to diarrhea  # MID OCT 2018- Right breast mass-Bx- ER/PR-NEG; Her 2 NEU POSITIVE 1~2.5cm;  [?NEW primary]  # MID-OCT 2018-METASTATIC RECURRENT-oh sternal mass; Left Ax LN [Bx]/periportal LN  # OCT 25th 2018- TAXOL-HERCEPTIN-PERJETA; Jan 2019- CT PR; continue HP only; on HOLD since NOV 2019--sec to drop in EF/Sieziues  # July 15th 2020- RE-STARTED HP   -------------------------------------------------------------------  # MUGA scan- July 28th 2017- 67%.  December 2019-EF 53%; January 2020 EF-52%  # chronic gait/balance issues  # Seizures/petit-mal/ Dr.Shah-Keppra [name] [id number]]  # June 2017- left breast Bx- fat necrosis [[name]  # july 2017-  BRCA 1& 2- NEG.   MOLECULAR TESTING- F ONE- TPS- 0%;  ERB2 amplification; PI3K/RET amplification Others**  # PALLIATIVE CARE: P  --------------------------------------------------    DIAGNOSIS: [ OCT 5993]- REC/MET- BREAST CA ER/PR-NEG; her 2 POS  STAGE: 4  ;GOALS: Palliative  CURRENT/MOST RECENT THERAPY- Herceptin-Perjeta [C]    Carcinoma of overlapping sites of left breast in female, estrogen receptor negative ([place])     INTERVAL HISTORY: Patient a poor historian given dementia.  Patient in a wheelchair.  Patient is accompanied by her husband today.  [name] 80 y.o.  female pleasant patient above history of metastatic  breast cancer on Herceptin plus Perjeta-is here for follow-up.  As per the husband patient does not have any worsening joint pains or back pain.  Continues to have itchy eyes.  No headaches.  No falls.  Appetite is fair.   Review of Systems  Unable to perform ROS: Dementia   PAST MEDICAL HISTORY :  Past Medical History:  Diagnosis Date   Arthritis    Brain tumor ([place]) 1995   meningeoma   Breast cancer ([place]) 2018   left breast; surgery 01/11/15 with Dr. Bary Castilla; path with invasive mammary and DCIS   Breast cancer of upper-inner quadrant of left female breast ([place]) 12/15/14   Completed radiation end of December and finished chemotherapy 2 weeks ago, Left breast invasive mammary carcinoma, T1cN58mc (1.5 cm); Grade 3, [name], [name], [name], .   Cataract    bilat    DDD (degenerative disc disease), lumbar    Lumbar, previously evaluated by Dr. HEarnestine Leys  Dementia ([facility]    Fibrocystic breast disease    prior biopsy   Glaucoma    Hard of hearing    wears hearing aides bilat    Hearing loss    History of cancer chemotherapy    History of radiation therapy    Hyperlipidemia, unspecified    Imbalance    Memory impairment    seen by Dr SManuella Ghazi possible post crainiotomy from radiation   Meningioma ([facility]    Left cavernous sinus meningioma[facility]   Numbness and tingling    right hand    Osteoarthritis  Osteoporosis, post-menopausal    Personal history of chemotherapy    Personal history of radiation therapy    Shoulder pain, right    Wears glasses     PAST SURGICAL HISTORY :   Past Surgical History:  Procedure Laterality Date   [place]   left frontal/temporal   BREAST BIOPSY Left 12/15/14   confirmed DCIS   BREAST BIOPSY Left 01/08/2015   Procedure: BREAST BIOPSY WITH NEEDLE LOCALIZATION;  Surgeon: [name], MD;  Location: [facility] ORS;   Service: General;  Laterality: Left;   BREAST EXCISIONAL BIOPSY Left 1997   BREAST LUMPECTOMY Left 01/08/2015   Procedure: LUMPECTOMY;  Surgeon: [name], MD;  Location: [facility] ORS;  Service: General;  Laterality: Left;   COLONOSCOPY  2010   Dr. [name]   ORIF ANKLE FRACTURE Right 08/05/2017   Procedure: OPEN REDUCTION INTERNAL FIXATION (ORIF) ANKLE FRACTURE;  Surgeon: Earnestine Leys, MD;  Location: [facility] ORS;  Service: Orthopedics;  Laterality: Right;   PORTACATH PLACEMENT Right 01/16/2015   Procedure: INSERTION PORT-A-CATH;  Surgeon: [name], MD;  Location: [facility] ORS;  Service: General;  Laterality: Right;   SENTINEL NODE BIOPSY Left 01/16/2015   Procedure: SENTINEL NODE BIOPSY;  Surgeon: [name], MD;  Location: [facility] ORS;  Service: General;  Laterality: Left;   TOTAL HIP ARTHROPLASTY Right 09/04/2015   Procedure: RIGHT TOTAL HIP ARTHROPLASTY ANTERIOR APPROACH;  Surgeon: [name], MD;  Location: [facility];  Service: Orthopedics;  Laterality: Right;    FAMILY HISTORY :   Family History  Problem Relation Age of Onset   Breast cancer Sister 79   Addison's disease Mother    Hyperthyroidism Mother    Osteoarthritis Mother    Stroke Father    Heart disease Father    High blood pressure Father     SOCIAL HISTORY:   Social History   Tobacco Use   Smoking status: Former    Packs/day: 0.25    Years: 5.00    Pack years: 1.25    Types: Cigarettes    Quit date: 07/28/1972    Years since quitting: 48.9   Smokeless tobacco: Never  Vaping Use   Vaping Use: Never used  Substance Use Topics   Alcohol use: Yes    Alcohol/week: 1.0 - 2.0 standard drink    Types: 1 - 2 Glasses of wine per week    Comment: 1 Glass Wine / Night   Drug use: No    ALLERGIES:  is allergic to donepezil.  MEDICATIONS:  Current Outpatient Medications  Medication Sig Dispense Refill   acetaminophen (TYLENOL) 325 MG tablet Take 650 mg by mouth every 4 (four) hours as needed. Pain / increased  temp.     albuterol (VENTOLIN HFA) 108 (90 Base) MCG/ACT inhaler Inhale 2 puffs into the lungs every 4 (four) hours as needed for wheezing or shortness of breath. 18 g 0   ARTIFICIAL TEARS 1 % ophthalmic solution Place 1 drop into both eyes daily.     ascorbic acid (VITAMIN C) 500 MG tablet Take 1 tablet by mouth daily.     Calcium Carbonate-Vitamin D (CALTRATE 600+D PO) Take 1 tablet by mouth daily.     celecoxib (CELEBREX) 200 MG capsule Take 200 mg by mouth daily.      levETIRAcetam (KEPPRA) 250 MG tablet 1 tablet daily at bedtime  5   traMADol (ULTRAM) 50 MG tablet Take 1 tablet (50 mg total) by mouth 2 (  two) times daily as needed for moderate pain. 60 tablet 0   Travoprost, BAK Free, (TRAVATAN) 0.004 % SOLN ophthalmic solution travoprost 0.004 % eye drops     vitamin E 400 UNIT capsule Take 400 Units by mouth daily.      No current facility-administered medications for this visit.   Facility-Administered Medications Ordered in Other Visits  Medication Dose Route Frequency Provider Last Rate Last Admin   heparin lock flush 100 unit/mL  500 Units Intracatheter Once PRN Cammie Sickle, MD       sodium chloride flush (NS) 0.9 % injection 10 mL  10 mL Intravenous PRN Cammie Sickle, MD   10 mL at 07/07/18 0830    PHYSICAL EXAMINATION: ECOG PERFORMANCE STATUS: 2 - Symptomatic, <50% confined to bed  BP 115/68 (BP Location: Right Arm, Patient Position: Sitting)   Pulse 82   Temp 98.3 F (36.8 C) (Tympanic)   Resp 18   Wt 173 lb (78.5 kg)   SpO2 96%   BMI 29.70 kg/m   Filed Weights   06/19/21 0908  Weight: 173 lb (78.5 kg)     Physical Exam Constitutional:      Comments: Patient is accompanied by husband.  In a wheelchair.  HENT:     Head: Normocephalic and atraumatic.     Mouth/Throat:     Pharynx: No oropharyngeal exudate.  Eyes:     Pupils: Pupils are equal, round, and reactive to light.  Cardiovascular:     Rate and Rhythm: Normal rate and regular rhythm.   Pulmonary:     Effort: Pulmonary effort is normal. No respiratory distress.     Breath sounds: Normal breath sounds. No wheezing.  Abdominal:     General: Bowel sounds are normal. There is no distension.     Palpations: Abdomen is soft. There is no mass.     Tenderness: There is no abdominal tenderness. There is no guarding or rebound.  Musculoskeletal:        General: No tenderness. Normal range of motion.     Cervical back: Normal range of motion and neck supple.  Skin:    General: Skin is warm.  Neurological:     Mental Status: She is alert and oriented to person, place, and time.  Psychiatric:        Mood and Affect: Affect normal.    LABORATORY DATA:  I have reviewed the data as listed    Component Value Date/Time   NA 135 06/19/2021 0850   K 3.5 06/19/2021 0850   CL 99 06/19/2021 0850   CO2 27 06/19/2021 0850   GLUCOSE 188 (H) 06/19/2021 0850   BUN 16 06/19/2021 0850   CREATININE 0.87 06/19/2021 0850   CALCIUM 8.5 (L) 06/19/2021 0850   PROT 7.1 06/19/2021 0850   ALBUMIN 4.0 06/19/2021 0850   AST 26 06/19/2021 0850   ALT 18 06/19/2021 0850   ALKPHOS 56 06/19/2021 0850   BILITOT 0.3 06/19/2021 0850   GFRNONAA >60 06/19/2021 0850   GFRAA >60 04/16/2020 0814    No results found for: SPEP, UPEP  Lab Results  Component Value Date   WBC 5.8 06/19/2021   NEUTROABS 4.0 06/19/2021   HGB 14.0 06/19/2021   HCT 44.0 06/19/2021   MCV 93.0 06/19/2021   PLT 281 06/19/2021      Chemistry      Component Value Date/Time   NA 135 06/19/2021 0850   K 3.5 06/19/2021 0850   CL 99 06/19/2021 0850  CO2 27 06/19/2021 0850   BUN 16 06/19/2021 0850   CREATININE 0.87 06/19/2021 0850      Component Value Date/Time   CALCIUM 8.5 (L) 06/19/2021 0850   ALKPHOS 56 06/19/2021 0850   AST 26 06/19/2021 0850   ALT 18 06/19/2021 0850   BILITOT 0.3 06/19/2021 0850       RADIOGRAPHIC STUDIES: I have personally reviewed the radiological images as listed and agreed with the  findings in the report. No results found.   ASSESSMENT & PLAN:  Carcinoma of overlapping sites of left breast in female, estrogen receptor negative ([place]) #Metastatic breast cancer ER [name] HER-2/neu positive. Apr 16, 2021--  CT scan chest and pelvis NED-  STABLE. sclerosis of the manubrium. STABLE.    # Currently on Herceptin-perjeta. Labs today reviewed;  acceptable for treatment today.  Given the overall stability disease-I think it is reasonable to give a break from therapy given that patient's borderline performance status underlying dementia.  Will reimage in end of January 2023; and then resume therapy at that time.  # Drop in ejection fraction-January 09, 2020 MUGA scan ejection fraction 47%; NOV 2022- STABLE at 53%- STABLE.    # Left breast calcifications: [[name]; mammogram OCT, 2022]-stable indeterminate calcifications-no mass noted on the mammogram.   repeat mammogram in 6 months.  [Discussed with Dr. Bary Castilla.   # dry eyes/ keratitis sicca- *herceptin-perjeta; not sure if casutive factor.  Continue moisturizing eyedrops-STABLE.   # Chronic back and hip pain/ gait instability-- On asper patch/ tramadol prn- STABLE  # Elevated BG-post breakfast in the range of 188-;STABLE.APRIL 2022-  HbA1c-5.6.    DISPOSITION:  # Herceptin-Perjeta today # Follow up-in 3 rd week of JAN MD; labs-cbc/cmp; Herceptin-Perjeta CT CAP;-.B    Orders Placed This Encounter  Procedures   CT CHEST ABDOMEN PELVIS W CONTRAST    Standing Status:   Future    Standing Expiration Date:   06/19/2022    Order Specific Question:   Preferred imaging location?    Answer:   [facility]    Order Specific Question:   Radiology Contrast Protocol - do NOT remove file path    Answer:   [email or web address]   CBC with Differential    Standing Status:   Future    Standing Expiration Date:   06/19/2022   Comprehensive metabolic panel    Standing Status:   Future     Standing Expiration Date:   06/19/2022     All questions were answered. The patient knows to call the clinic with any problems, questions or concerns.      Cammie Sickle, MD 06/19/2021 9:29 AM

## 2021-06-19 NOTE — Patient Instructions (Addendum)
[facility]  Discharge Instructions: Thank you for choosing [facility] to provide your oncology and hematology care.  If you have a lab appointment with the [facility], please go directly to the [facility] and check in at the registration area.  Wear comfortable clothing and clothing appropriate for easy access to any Portacath or PICC line.   We strive to give you quality time with your provider. You may need to reschedule your appointment if you arrive late (15 or more minutes).  Arriving late affects you and other patients whose appointments are after yours.  Also, if you miss three or more appointments without notifying the office, you may be dismissed from the clinic at the provider's discretion.      For prescription refill requests, have your pharmacy contact our office and allow 72 hours for refills to be completed.    Today you received the following chemotherapy and/or immunotherapy agents Kanjinti & Perjeta    To help prevent nausea and vomiting after your treatment, we encourage you to take your nausea medication as directed.  BELOW ARE SYMPTOMS THAT SHOULD BE REPORTED IMMEDIATELY: *FEVER GREATER THAN 100.4 F (38 C) OR HIGHER *CHILLS OR SWEATING *NAUSEA AND VOMITING THAT IS NOT CONTROLLED WITH YOUR NAUSEA MEDICATION *UNUSUAL SHORTNESS OF BREATH *UNUSUAL BRUISING OR BLEEDING *URINARY PROBLEMS (pain or burning when urinating, or frequent urination) *BOWEL PROBLEMS (unusual diarrhea, constipation, pain near the anus) TENDERNESS IN MOUTH AND THROAT WITH OR WITHOUT PRESENCE OF ULCERS (sore throat, sores in mouth, or a toothache) UNUSUAL RASH, SWELLING OR PAIN  UNUSUAL VAGINAL DISCHARGE OR ITCHING   Items with * indicate a potential emergency and should be followed up as soon as possible or go to the Emergency Department if any problems should occur.  Please show the CHEMOTHERAPY ALERT CARD or IMMUNOTHERAPY ALERT CARD at  check-in to the Emergency Department and triage nurse.  Should you have questions after your visit or need to cancel or reschedule your appointment, please contact [facility]  [phone] and follow the prompts.  Office hours are 8:00 a.m. to 4:30 p.m. Monday - Friday. Please note that voicemails left after 4:00 p.m. may not be returned until the following business day.  We are closed weekends and major holidays. You have access to a nurse at all times for urgent questions. Please call the main number to the clinic [phone] and follow the prompts.  For any non-urgent questions, you may also contact your provider using MyChart. We now offer e-Visits for anyone 48 and older to request care online for non-urgent symptoms. For details visit [email or web address].   Also download the MyChart app! Go to the app store, search "MyChart", open the app, select [facility], and log in with your MyChart username and password.  Due to Covid, a mask is required upon entering the hospital/clinic. If you do not have a mask, one will be given to you upon arrival. For doctor visits, patients may have 1 support person aged 78 or older with them. For treatment visits, patients cannot have anyone with them due to current Covid guidelines and our immunocompromised population.

## 2021-06-19 NOTE — Assessment & Plan Note (Addendum)
#  Metastatic breast cancer ER [name] HER-2/neu positive. Apr 16, 2021--  CT scan chest and pelvis NED-  STABLE. sclerosis of the manubrium. STABLE.    # Currently on Herceptin-perjeta. Labs today reviewed;  acceptable for treatment today.  Given the overall stability disease-I think it is reasonable to give a break from therapy given that patient's borderline performance status underlying dementia.  Will reimage in end of January 2023; and then resume therapy at that time.  # Drop in ejection fraction-January 09, 2020 MUGA scan ejection fraction 47%; NOV 2022- STABLE at 53%- STABLE.    # Left breast calcifications: [[name]; mammogram OCT, 2022]-stable indeterminate calcifications-no mass noted on the mammogram.   repeat mammogram in 6 months.  [Discussed with Dr. Bary Castilla.   # dry eyes/ keratitis sicca- *herceptin-perjeta; not sure if casutive factor.  Continue moisturizing eyedrops-STABLE.   # Chronic back and hip pain/ gait instability-- On asper patch/ tramadol prn- STABLE  # Elevated BG-post breakfast in the range of 188-;STABLE.APRIL 2022-  HbA1c-5.6.    DISPOSITION:  # Herceptin-Perjeta today # Follow up-in 3 rd week of JAN MD; labs-cbc/cmp; Herceptin-Perjeta CT CAP;-.B

## 2021-07-02 ENCOUNTER — Other Ambulatory Visit: Payer: Self-pay | Admitting: General Surgery

## 2021-07-02 DIAGNOSIS — Z853 Personal history of malignant neoplasm of breast: Secondary | ICD-10-CM

## 2021-07-08 ENCOUNTER — Ambulatory Visit: Payer: Medicare Other

## 2021-07-10 ENCOUNTER — Other Ambulatory Visit: Payer: Medicare Other

## 2021-07-10 ENCOUNTER — Ambulatory Visit: Payer: Medicare Other | Admitting: Internal Medicine

## 2021-07-10 ENCOUNTER — Ambulatory Visit: Payer: Medicare Other

## 2021-07-11 ENCOUNTER — Other Ambulatory Visit: Payer: Medicare Other

## 2021-07-11 ENCOUNTER — Ambulatory Visit: Payer: Medicare Other | Admitting: Nurse Practitioner

## 2021-07-11 ENCOUNTER — Ambulatory Visit: Payer: Medicare Other

## 2021-08-05 ENCOUNTER — Encounter: Payer: Self-pay | Admitting: Internal Medicine

## 2021-08-05 NOTE — Progress Notes (Signed)
Last herceptin/perjeta given on 11/23, next appt on 08/12/21.  Reload both doses per MD for 1/16.

## 2021-08-08 ENCOUNTER — Ambulatory Visit
Admission: RE | Admit: 2021-08-08 | Discharge: 2021-08-08 | Disposition: A | Discharge status: Home / Self Care | Payer: Medicare Other | Source: Ambulatory Visit | Attending: Internal Medicine | Admitting: Internal Medicine

## 2021-08-08 ENCOUNTER — Other Ambulatory Visit: Payer: Self-pay

## 2021-08-08 DIAGNOSIS — C50812 Malignant neoplasm of overlapping sites of left female breast: Secondary | ICD-10-CM | POA: Diagnosis present

## 2021-08-08 DIAGNOSIS — Z171 Estrogen receptor negative status [ER-]: Secondary | ICD-10-CM | POA: Diagnosis present

## 2021-08-08 LAB — POCT I-STAT CREATININE: Creatinine, Ser: 1 mg/dL (ref 0.44–1.00)

## 2021-08-08 MED ORDER — IOHEXOL 300 MG/ML  SOLN
85.0000 mL | Freq: Once | INTRAMUSCULAR | Status: AC | PRN
  Administered 2021-08-08: 85 mL via INTRAVENOUS

## 2021-08-12 ENCOUNTER — Inpatient Hospital Stay: Payer: Medicare Other

## 2021-08-12 ENCOUNTER — Encounter: Payer: Self-pay | Admitting: Internal Medicine

## 2021-08-12 ENCOUNTER — Inpatient Hospital Stay: Payer: Medicare Other | Attending: Internal Medicine

## 2021-08-12 ENCOUNTER — Other Ambulatory Visit: Payer: Self-pay

## 2021-08-12 ENCOUNTER — Inpatient Hospital Stay (HOSPITAL_BASED_OUTPATIENT_CLINIC_OR_DEPARTMENT_OTHER): Payer: Medicare Other | Admitting: Internal Medicine

## 2021-08-12 DIAGNOSIS — C7951 Secondary malignant neoplasm of bone: Secondary | ICD-10-CM | POA: Diagnosis not present

## 2021-08-12 DIAGNOSIS — C50812 Malignant neoplasm of overlapping sites of left female breast: Secondary | ICD-10-CM

## 2021-08-12 DIAGNOSIS — Z171 Estrogen receptor negative status [ER-]: Secondary | ICD-10-CM

## 2021-08-12 LAB — CBC WITH DIFFERENTIAL/PLATELET
Abs Immature Granulocytes: 0.02 10*3/uL (ref 0.00–0.07)
Basophils Absolute: 0 10*3/uL (ref 0.0–0.1)
Basophils Relative: 1 %
Eosinophils Absolute: 0.2 10*3/uL (ref 0.0–0.5)
Eosinophils Relative: 2 %
HCT: 43.7 % (ref 36.0–46.0)
Hemoglobin: 13.9 g/dL (ref 12.0–15.0)
Immature Granulocytes: 0 %
Lymphocytes Relative: 19 %
Lymphs Abs: 1.2 10*3/uL (ref 0.7–4.0)
MCH: 29.8 pg (ref 26.0–34.0)
MCHC: 31.8 g/dL (ref 30.0–36.0)
MCV: 93.8 fL (ref 80.0–100.0)
Monocytes Absolute: 0.4 10*3/uL (ref 0.1–1.0)
Monocytes Relative: 5 %
Neutro Abs: 4.7 10*3/uL (ref 1.7–7.7)
Neutrophils Relative %: 73 %
Platelets: 276 10*3/uL (ref 150–400)
RBC: 4.66 MIL/uL (ref 3.87–5.11)
RDW: 13.6 % (ref 11.5–15.5)
WBC: 6.4 10*3/uL (ref 4.0–10.5)
nRBC: 0 % (ref 0.0–0.2)

## 2021-08-12 LAB — COMPREHENSIVE METABOLIC PANEL
ALT: 15 U/L (ref 0–44)
AST: 26 U/L (ref 15–41)
Albumin: 3.9 g/dL (ref 3.5–5.0)
Alkaline Phosphatase: 53 U/L (ref 38–126)
Anion gap: 8 (ref 5–15)
BUN: 18 mg/dL (ref 8–23)
CO2: 27 mmol/L (ref 22–32)
Calcium: 8.5 mg/dL — ABNORMAL LOW (ref 8.9–10.3)
Chloride: 99 mmol/L (ref 98–111)
Creatinine, Ser: 0.81 mg/dL (ref 0.44–1.00)
GFR, Estimated: >60 mL/min (ref >60)
Glucose, Bld: 199 mg/dL — ABNORMAL HIGH (ref 70–99)
Potassium: 3.5 mmol/L (ref 3.5–5.1)
Sodium: 134 mmol/L — ABNORMAL LOW (ref 135–145)
Total Bilirubin: 0.5 mg/dL (ref 0.3–1.2)
Total Protein: 6.7 g/dL (ref 6.5–8.1)

## 2021-08-12 NOTE — Assessment & Plan Note (Addendum)
#  Metastatic breast cancer ER [name] HER-2/neu positive. JAN 2023- CT scan chest and pelvis NED; STABLE sclerosis of the manubrium.  #Patient currently on  Herceptin plus Perjeta every 3 weeks maintenance.  Given overall stability of the disease/patient's moderate dementia/ ?  Side effects from therapy including dry eyes-discussed the pros and cons of continued therapy versus break from treatment.  After discussion with family-it was felt reasonable to hold further therapy at this time/chemotherapy.  We will plan to reevaluate with scans every 3 to 4 months.  We will plan reevaluation clinically in about 6 weeks.  # Drop in ejection fraction-January 09, 2020 MUGA scan ejection fraction 47%; NOV 2022- STABLE at 53%-clinically stable.  Monitor for now.  # Left breast calcifications: [[name]; mammogram OCT, 2022]-stable indeterminate calcifications-no mass noted on the mammogram.   repeat mammogram in 6 months- April 2023.  [Discussed with Dr. Bary Castilla.   # dry eyes/ keratitis sicca- *herceptin-perjeta; not sure if casutive factor.  Continue moisturizing eyedrops-stable.  # Chronic back and hip pain/ gait instability-- On asper patch/ tramadol prn- STABLE.   # Elevated BG-post breakfast in the range of 188-;STABLE.APRIL 2022-  HbA1c-5.6; decrease appetite/nutrition-recommend evaluation with [facility]/telephone.   #Incidental findings on  CT  Imaging dated: 08-10-2021, constipation; atherosclerosis; left lower lobe lung nodule 3 to 4 mm in size/chronic benign.  Multilevel degenerative arthritis.: I reviewed/discussed/counseled the patient's daughter.   DISPOSITION:  # referral to [facility] re: diet/ needs telephoe viist # HOLD Herceptin-Perjeta today; de-accesss  # Follow up-in 6 weeks; ; labs-cbc/cmp; ca27-29; CEA; ca-15-3;;port flush-;-.B  # I reviewed the blood work- with the patient in detail; also reviewed the imaging independently [as summarized above]; and with the patient's family-daughter and  husband in detail.  They are in agreement.

## 2021-08-12 NOTE — Progress Notes (Signed)
[place] OFFICE PROGRESS NOTE  Patient Care Team: [name], MD as PCP - General (Internal Medicine) Bary Castilla, [name], MD (General Surgery) Requested, Self Cammie Sickle, MD as Consulting Physician (Internal Medicine)   Cancer Staging  No matching staging information was found for the patient.   Oncology History Overview Note  # June 2016- LEFT BREAST CA;  [name] ; [name] ; [name]; Her 2 Neu POS; [place] from July OF 2016; [name]; adjuvant Herceptin [ Finished July 2017]; AUG 2017- Neratinib x5 days; DISCON sec to diarrhea  # MID OCT 2018- Right breast mass-Bx- ER/PR-NEG; Her 2 NEU POSITIVE 1~2.5cm;  [?NEW primary]  # MID-OCT 2018-METASTATIC RECURRENT-oh sternal mass; Left Ax LN [Bx]/periportal LN  # OCT 25th 2018- TAXOL-HERCEPTIN-PERJETA; Jan 2019- CT PR; continue HP only; on HOLD since NOV 2019--sec to drop in EF/Sieziues  # July 15th 2020- RE-STARTED HP   -------------------------------------------------------------------  # MUGA scan- July 28th 2017- 67%.  December 2019-EF 53%; January 2020 EF-52%  # chronic gait/balance issues  # Seizures/petit-mal/ Dr.Shah-Keppra [name] [id number]]  # June 2017- left breast Bx- fat necrosis [[name]  # july 2017-  BRCA 1& 2- NEG.   MOLECULAR TESTING- F ONE- TPS- 0%;  ERB2 amplification; PI3K/RET amplification Others**  # PALLIATIVE CARE: P  --------------------------------------------------    DIAGNOSIS: [ OCT 1856]- REC/MET- BREAST CA ER/PR-NEG; her 2 POS  STAGE: 4  ;GOALS: Palliative  CURRENT/MOST RECENT THERAPY- Herceptin-Perjeta [C]    Carcinoma of overlapping sites of left breast in female, estrogen receptor negative ([place])     INTERVAL HISTORY: Patient a poor historian given dementia.  Patient in a wheelchair.  Patient is accompanied by her daughter today.  [name] 81 y.o.  female pleasant patient above history of  metastatic breast cancer on Herceptin plus Perjeta-is here for follow-up/review results of the CT scan.  As per the family patient having difficulty differentiate between hot and cold water.  However denies any worsening joint pains or back pain.  Continues to have itchy eyes.  No headaches.  No falls.  Appetite is poor.   Review of Systems  Unable to perform ROS: Dementia   PAST MEDICAL HISTORY :  Past Medical History:  Diagnosis Date   Arthritis    Brain tumor ([place]) 1995   meningeoma   Breast cancer ([place]) 2018   left breast; surgery 01/11/15 with Dr. Bary Castilla; path with invasive mammary and DCIS   Breast cancer of upper-inner quadrant of left female breast ([place]) 12/15/14   Completed radiation end of December and finished chemotherapy 2 weeks ago, Left breast invasive mammary carcinoma, T1cN36mc (1.5 cm); Grade 3, [name], [name], [name], .   Cataract    bilat    DDD (degenerative disc disease), lumbar    Lumbar, previously evaluated by Dr. HEarnestine Leys  Dementia ([facility]    Fibrocystic breast disease    prior biopsy   Glaucoma    Hard of hearing    wears hearing aides bilat    Hearing loss    History of cancer chemotherapy    History of radiation therapy    Hyperlipidemia, unspecified    Imbalance    Memory impairment    seen by Dr SManuella Ghazi possible post crainiotomy from radiation   Meningioma ([facility]    Left cavernous sinus meningioma, [facility].  Numbness and tingling    right hand    Osteoarthritis    Osteoporosis, post-menopausal    Personal history of chemotherapy    Personal history of radiation therapy    Shoulder pain, right    Wears glasses     PAST SURGICAL HISTORY :   Past Surgical History:  Procedure Laterality Date   [place]   left frontal/temporal   BREAST BIOPSY Left 12/15/14   confirmed DCIS   BREAST BIOPSY Left 01/08/2015   Procedure:  BREAST BIOPSY WITH NEEDLE LOCALIZATION;  Surgeon: [name], MD;  Location: [facility] ORS;  Service: General;  Laterality: Left;   BREAST EXCISIONAL BIOPSY Left 1997   BREAST LUMPECTOMY Left 01/08/2015   Procedure: LUMPECTOMY;  Surgeon: [name], MD;  Location: [facility] ORS;  Service: General;  Laterality: Left;   COLONOSCOPY  2010   Dr. [name]   ORIF ANKLE FRACTURE Right 08/05/2017   Procedure: OPEN REDUCTION INTERNAL FIXATION (ORIF) ANKLE FRACTURE;  Surgeon: Earnestine Leys, MD;  Location: [facility] ORS;  Service: Orthopedics;  Laterality: Right;   PORTACATH PLACEMENT Right 01/16/2015   Procedure: INSERTION PORT-A-CATH;  Surgeon: [name], MD;  Location: [facility] ORS;  Service: General;  Laterality: Right;   SENTINEL NODE BIOPSY Left 01/16/2015   Procedure: SENTINEL NODE BIOPSY;  Surgeon: [name], MD;  Location: [facility] ORS;  Service: General;  Laterality: Left;   TOTAL HIP ARTHROPLASTY Right 09/04/2015   Procedure: RIGHT TOTAL HIP ARTHROPLASTY ANTERIOR APPROACH;  Surgeon: [name], MD;  Location: [facility] ORS;  Service: Orthopedics;  Laterality: Right;    FAMILY HISTORY :   Family History  Problem Relation Age of Onset   Breast cancer Sister 19   Addison's disease Mother    Hyperthyroidism Mother    Osteoarthritis Mother    Stroke Father    Heart disease Father    High blood pressure Father     SOCIAL HISTORY:   Social History   Tobacco Use   Smoking status: Former    Packs/day: 0.25    Years: 5.00    Pack years: 1.25    Types: Cigarettes    Quit date: 07/28/1972    Years since quitting: 49.0   Smokeless tobacco: Never  Vaping Use   Vaping Use: Never used  Substance Use Topics   Alcohol use: Yes    Alcohol/week: 1.0 - 2.0 standard drink    Types: 1 - 2 Glasses of wine per week    Comment: 1 Glass Wine / Night   Drug use: No    ALLERGIES:  is allergic to donepezil.  MEDICATIONS:  Current Outpatient Medications  Medication Sig Dispense Refill   acetaminophen  (TYLENOL) 325 MG tablet Take 650 mg by mouth every 4 (four) hours as needed. Pain / increased temp.     albuterol (VENTOLIN HFA) 108 (90 Base) MCG/ACT inhaler Inhale 2 puffs into the lungs every 4 (four) hours as needed for wheezing or shortness of breath. 18 g 0   ARTIFICIAL TEARS 1 % ophthalmic solution Place 1 drop into both eyes daily.     celecoxib (CELEBREX) 200 MG capsule Take 200 mg by mouth daily.      levETIRAcetam (KEPPRA) 250 MG tablet 1 tablet daily at bedtime  5   Travoprost, BAK Free, (TRAVATAN) 0.004 % SOLN ophthalmic solution travoprost 0.004 % eye drops     ascorbic acid (VITAMIN C) 500 MG tablet Take 1 tablet by mouth daily.  Calcium Carbonate-Vitamin D (CALTRATE 600+D PO) Take 1 tablet by mouth daily. (Patient not taking: Reported on 08/12/2021)     traMADol (ULTRAM) 50 MG tablet Take 1 tablet (50 mg total) by mouth 2 (two) times daily as needed for moderate pain. (Patient not taking: Reported on 08/12/2021) 60 tablet 0   vitamin E 400 UNIT capsule Take 400 Units by mouth daily.  (Patient not taking: Reported on 08/12/2021)     No current facility-administered medications for this visit.   Facility-Administered Medications Ordered in Other Visits  Medication Dose Route Frequency Provider Last Rate Last Admin   heparin lock flush 100 unit/mL  500 Units Intracatheter Once PRN Cammie Sickle, MD       sodium chloride flush (NS) 0.9 % injection 10 mL  10 mL Intravenous PRN Cammie Sickle, MD   10 mL at 07/07/18 0830    PHYSICAL EXAMINATION: ECOG PERFORMANCE STATUS: 2 - Symptomatic, <50% confined to bed  BP 121/77    Pulse 79    Resp 16    Wt 173 lb (78.5 kg)    BMI 29.70 kg/m   Filed Weights   08/12/21 0858  Weight: 173 lb (78.5 kg)     Physical Exam Constitutional:      Comments: Patient is accompanied by husband.  In a wheelchair.  HENT:     Head: Normocephalic and atraumatic.     Mouth/Throat:     Pharynx: No oropharyngeal exudate.  Eyes:      Pupils: Pupils are equal, round, and reactive to light.  Cardiovascular:     Rate and Rhythm: Normal rate and regular rhythm.  Pulmonary:     Effort: Pulmonary effort is normal. No respiratory distress.     Breath sounds: Normal breath sounds. No wheezing.  Abdominal:     General: Bowel sounds are normal. There is no distension.     Palpations: Abdomen is soft. There is no mass.     Tenderness: There is no abdominal tenderness. There is no guarding or rebound.  Musculoskeletal:        General: No tenderness. Normal range of motion.     Cervical back: Normal range of motion and neck supple.  Skin:    General: Skin is warm.  Neurological:     Mental Status: She is alert and oriented to person, place, and time.  Psychiatric:        Mood and Affect: Affect normal.    LABORATORY DATA:  I have reviewed the data as listed    Component Value Date/Time   NA 134 (L) 08/12/2021 0837   K 3.5 08/12/2021 0837   CL 99 08/12/2021 0837   CO2 27 08/12/2021 0837   GLUCOSE 199 (H) 08/12/2021 0837   BUN 18 08/12/2021 0837   CREATININE 0.81 08/12/2021 0837   CALCIUM 8.5 (L) 08/12/2021 0837   PROT 6.7 08/12/2021 0837   ALBUMIN 3.9 08/12/2021 0837   AST 26 08/12/2021 0837   ALT 15 08/12/2021 0837   ALKPHOS 53 08/12/2021 0837   BILITOT 0.5 08/12/2021 0837   GFRNONAA >60 08/12/2021 0837   GFRAA >60 04/16/2020 0814    No results found for: SPEP, UPEP  Lab Results  Component Value Date   WBC 6.4 08/12/2021   NEUTROABS 4.7 08/12/2021   HGB 13.9 08/12/2021   HCT 43.7 08/12/2021   MCV 93.8 08/12/2021   PLT 276 08/12/2021      Chemistry      Component Value Date/Time   NA  134 (L) 08/12/2021 0837   K 3.5 08/12/2021 0837   CL 99 08/12/2021 0837   CO2 27 08/12/2021 0837   BUN 18 08/12/2021 0837   CREATININE 0.81 08/12/2021 0837      Component Value Date/Time   CALCIUM 8.5 (L) 08/12/2021 0837   ALKPHOS 53 08/12/2021 0837   AST 26 08/12/2021 0837   ALT 15 08/12/2021 0837   BILITOT  0.5 08/12/2021 0837       RADIOGRAPHIC STUDIES: I have personally reviewed the radiological images as listed and agreed with the findings in the report. No results found.   ASSESSMENT & PLAN:  Carcinoma of overlapping sites of left breast in female, estrogen receptor negative ([place]) #Metastatic breast cancer ER [name] HER-2/neu positive. JAN 2023- CT scan chest and pelvis NED; STABLE sclerosis of the manubrium.  #Patient currently on  Herceptin plus Perjeta every 3 weeks maintenance.  Given overall stability of the disease/patient's moderate dementia/ ?  Side effects from therapy including dry eyes-discussed the pros and cons of continued therapy versus break from treatment.  After discussion with family-it was felt reasonable to hold further therapy at this time/chemotherapy.  We will plan to reevaluate with scans every 3 to 4 months.  We will plan reevaluation clinically in about 6 weeks.   # Drop in ejection fraction-January 09, 2020 MUGA scan ejection fraction 47%; NOV 2022- STABLE at 53%-clinically stable.  Monitor for now.  # Left breast calcifications: [[name]; mammogram OCT, 2022]-stable indeterminate calcifications-no mass noted on the mammogram.   repeat mammogram in 6 months- April 2023.  [Discussed with Dr. Bary Castilla.   # dry eyes/ keratitis sicca- *herceptin-perjeta; not sure if casutive factor.  Continue moisturizing eyedrops-stable.  # Chronic back and hip pain/ gait instability-- On asper patch/ tramadol prn- STABLE.   # Elevated BG-post breakfast in the range of 188-;STABLE.APRIL 2022-  HbA1c-5.6; decrease appetite/nutrition-recommend evaluation with [facility]/telephone.   #Incidental findings on  CT  Imaging dated: 08-10-2021, constipation; atherosclerosis; left lower lobe lung nodule 3 to 4 mm in size/chronic benign.  Multilevel degenerative arthritis.: I reviewed/discussed/counseled the patient's daughter.   DISPOSITION:  # referral to [facility] re: diet/ needs telephoe  viist # HOLD Herceptin-Perjeta today; de-accesss  # Follow up-in 6 weeks; ; labs-cbc/cmp; ca27-29; CEA; ca-15-3;;port flush-;-.B  # I reviewed the blood work- with the patient in detail; also reviewed the imaging independently [as summarized above]; and with the patient's family-daughter and husband in detail.  They are in agreement.      Orders Placed This Encounter  Procedures   Cancer antigen 27.29    Standing Status:   Future    Standing Expiration Date:   08/12/2022   CEA    Standing Status:   Future    Standing Expiration Date:   08/12/2022   Cancer antigen 15-3    Standing Status:   Future    Standing Expiration Date:   08/12/2022   Ambulatory Referral to [facility] Nutrition    Referral Priority:   Routine    Referral Type:   Consultation    Referral Reason:   Specialty Services Required    Number of Visits Requested:   1     All questions were answered. The patient knows to call the clinic with any problems, questions or concerns.      Cammie Sickle, MD 08/12/2021 8:18 PM

## 2021-08-12 NOTE — Progress Notes (Signed)
Patient's daughter reports that she seems to have a touch sensitivity, for example says shower water is hot when it is not.

## 2021-08-16 ENCOUNTER — Inpatient Hospital Stay: Payer: Medicare Other

## 2021-08-16 NOTE — Progress Notes (Addendum)
Nutrition Follow-up:  Husband requested to speak with RD.  Patient with metastatic breast cancer currently on herceptin and perjeta.  Planning to take break from treatment.    Spoke with husband and son, Ronalee Belts via phone.  Husband and son wanted to discuss healthy diet.  Husband and patient have support from caregivers and family with planning meals and menus.   Husband and son report that patient has good appetite.  Typically eats 3 meals per day usually cereal with fruit, milk, small juice.  Lunch maybe peanut butter and jelly sandwich. Dinner is prepared meal with meat and vegetable or two (starches as well).  Patient mostly drinks water with meals except for breakfast milk and small juice.    Medications: reviewed  Labs: reviewed  Anthropometrics:   Weight 173 lb on 1/16  Weight has been stable 171-174 lb since 10/2020   NUTRITION DIAGNOSIS: none at this time   INTERVENTION:  Discussed current guidelines from [employer] for breast cancer patients.  Will email written information. Will email sample meal plans for husband and son to review.   Husband with questions regarding vitamins. General MVI daily would be appropriate and if specific vitamins/minerals are found to be low to treat those under MD recommendations. Encouraged activity as tolerated.  Husband and son wanted to preserve lean muscle mass in patient.   Contact information included provided    NEXT VISIT: no follow-up RD available if needed  Shahana Capes B. Zenia Resides, [place], [place] Registered Dietitian [phone] (mobile)

## 2021-09-24 ENCOUNTER — Other Ambulatory Visit: Payer: Self-pay

## 2021-09-24 ENCOUNTER — Encounter: Payer: Self-pay | Admitting: Internal Medicine

## 2021-09-24 ENCOUNTER — Inpatient Hospital Stay: Payer: Medicare Other

## 2021-09-24 ENCOUNTER — Inpatient Hospital Stay: Payer: Medicare Other | Attending: Internal Medicine | Admitting: Internal Medicine

## 2021-09-24 DIAGNOSIS — M25559 Pain in unspecified hip: Secondary | ICD-10-CM | POA: Insufficient documentation

## 2021-09-24 DIAGNOSIS — F03B0 Unspecified dementia, moderate, without behavioral disturbance, psychotic disturbance, mood disturbance, and anxiety: Secondary | ICD-10-CM | POA: Diagnosis not present

## 2021-09-24 DIAGNOSIS — C50812 Malignant neoplasm of overlapping sites of left female breast: Secondary | ICD-10-CM

## 2021-09-24 DIAGNOSIS — M549 Dorsalgia, unspecified: Secondary | ICD-10-CM | POA: Insufficient documentation

## 2021-09-24 DIAGNOSIS — Z171 Estrogen receptor negative status [ER-]: Secondary | ICD-10-CM | POA: Insufficient documentation

## 2021-09-24 DIAGNOSIS — R921 Mammographic calcification found on diagnostic imaging of breast: Secondary | ICD-10-CM | POA: Insufficient documentation

## 2021-09-24 DIAGNOSIS — H168 Other keratitis: Secondary | ICD-10-CM | POA: Insufficient documentation

## 2021-09-24 DIAGNOSIS — Z95828 Presence of other vascular implants and grafts: Secondary | ICD-10-CM

## 2021-09-24 DIAGNOSIS — Z87891 Personal history of nicotine dependence: Secondary | ICD-10-CM | POA: Insufficient documentation

## 2021-09-24 DIAGNOSIS — R739 Hyperglycemia, unspecified: Secondary | ICD-10-CM | POA: Insufficient documentation

## 2021-09-24 DIAGNOSIS — R2689 Other abnormalities of gait and mobility: Secondary | ICD-10-CM | POA: Insufficient documentation

## 2021-09-24 DIAGNOSIS — R350 Frequency of micturition: Secondary | ICD-10-CM | POA: Diagnosis not present

## 2021-09-24 LAB — COMPREHENSIVE METABOLIC PANEL
ALT: 18 U/L (ref 0–44)
AST: 22 U/L (ref 15–41)
Albumin: 3.9 g/dL (ref 3.5–5.0)
Alkaline Phosphatase: 54 U/L (ref 38–126)
Anion gap: 8 (ref 5–15)
BUN: 20 mg/dL (ref 8–23)
CO2: 28 mmol/L (ref 22–32)
Calcium: 8.6 mg/dL — ABNORMAL LOW (ref 8.9–10.3)
Chloride: 101 mmol/L (ref 98–111)
Creatinine, Ser: 0.92 mg/dL (ref 0.44–1.00)
GFR, Estimated: >60 mL/min (ref >60)
Glucose, Bld: 118 mg/dL — ABNORMAL HIGH (ref 70–99)
Potassium: 3.9 mmol/L (ref 3.5–5.1)
Sodium: 137 mmol/L (ref 135–145)
Total Bilirubin: <0.1 mg/dL — ABNORMAL LOW (ref 0.3–1.2)
Total Protein: 7 g/dL (ref 6.5–8.1)

## 2021-09-24 LAB — CBC WITH DIFFERENTIAL/PLATELET
Abs Immature Granulocytes: 0.02 10*3/uL (ref 0.00–0.07)
Basophils Absolute: 0 10*3/uL (ref 0.0–0.1)
Basophils Relative: 0 %
Eosinophils Absolute: 0.2 10*3/uL (ref 0.0–0.5)
Eosinophils Relative: 2 %
HCT: 43.7 % (ref 36.0–46.0)
Hemoglobin: 14.1 g/dL (ref 12.0–15.0)
Immature Granulocytes: 0 %
Lymphocytes Relative: 19 %
Lymphs Abs: 1.4 10*3/uL (ref 0.7–4.0)
MCH: 29.7 pg (ref 26.0–34.0)
MCHC: 32.3 g/dL (ref 30.0–36.0)
MCV: 92 fL (ref 80.0–100.0)
Monocytes Absolute: 0.5 10*3/uL (ref 0.1–1.0)
Monocytes Relative: 7 %
Neutro Abs: 5.2 10*3/uL (ref 1.7–7.7)
Neutrophils Relative %: 72 %
Platelets: 271 10*3/uL (ref 150–400)
RBC: 4.75 MIL/uL (ref 3.87–5.11)
RDW: 13.7 % (ref 11.5–15.5)
WBC: 7.2 10*3/uL (ref 4.0–10.5)
nRBC: 0 % (ref 0.0–0.2)

## 2021-09-24 MED ORDER — HEPARIN SOD (PORK) LOCK FLUSH 100 UNIT/ML IV SOLN
500.0000 [IU] | Freq: Once | INTRAVENOUS | Status: AC
  Administered 2021-09-24: 500 [IU] via INTRAVENOUS
  Filled 2021-09-24: qty 5

## 2021-09-24 MED ORDER — SODIUM CHLORIDE 0.9% FLUSH
10.0000 mL | Freq: Once | INTRAVENOUS | Status: AC
  Administered 2021-09-24: 10 mL via INTRAVENOUS
  Filled 2021-09-24: qty 10

## 2021-09-24 NOTE — Progress Notes (Signed)
[name] OFFICE PROGRESS NOTE  Patient Care Team: [name], MD as PCP - General (Internal Medicine) Bary Castilla, [name], MD (General Surgery) Requested, Self Cammie Sickle, MD as Consulting Physician (Internal Medicine)   Cancer Staging  No matching staging information was found for the patient.   Oncology History Overview Note  # June 2016- LEFT BREAST CA;  [name] ; [name] ; [name]; Her 2 Neu POS; [place] from July OF 2016; [name]; adjuvant Herceptin [ Finished July 2017]; AUG 2017- Neratinib x5 days; DISCON sec to diarrhea  # MID OCT 2018- Right breast mass-Bx- ER/PR-NEG; Her 2 NEU POSITIVE 1~2.5cm;  [?NEW primary]  # MID-OCT 2018-METASTATIC RECURRENT-oh sternal mass; Left Ax LN [Bx]/periportal LN  # OCT 25th 2018- TAXOL-HERCEPTIN-PERJETA; Jan 2019- CT PR; continue HP only; on HOLD since NOV 2019--sec to drop in EF/Sieziues  # July 15th 2020- RE-STARTED HP   -------------------------------------------------------------------  # MUGA scan- July 28th 2017- 67%.  December 2019-EF 53%; January 2020 EF-52%  # chronic gait/balance issues  # Seizures/petit-mal/ Dr.Shah-Keppra [name] [id number]]  # June 2017- left breast Bx- fat necrosis [[name]  # july 2017-  BRCA 1& 2- NEG.   MOLECULAR TESTING- F ONE- TPS- 0%;  ERB2 amplification; PI3K/RET amplification Others**  # PALLIATIVE CARE: P  --------------------------------------------------    DIAGNOSIS: [ OCT 0174]- REC/MET- BREAST CA ER/PR-NEG; her 2 POS  STAGE: 4  ;GOALS: Palliative  CURRENT/MOST RECENT THERAPY- Herceptin-Perjeta [C]    Carcinoma of overlapping sites of left breast in female, estrogen receptor negative ([place])     INTERVAL HISTORY: Patient a poor historian given dementia.  Patient in a wheelchair.  Patient is accompanied by her daughter today.  [name] 81 y.o.  female pleasant patient above history of  metastatic breast cancer "chemo holiday" [most recently Herceptin plus Perjet] is here for follow-up.  Given increased frequency of urination-patient dropped off urine sample to urology/Dr. Eliberto Ivory today.  However denies any worsening joint pains or back pain.  Continues to have itchy eyes.  No headaches.  No falls.     Review of Systems  Unable to perform ROS: Dementia   PAST MEDICAL HISTORY :  Past Medical History:  Diagnosis Date   Arthritis    Brain tumor ([place]) 1995   meningeoma   Breast cancer ([place]) 2018   left breast; surgery 01/11/15 with Dr. Bary Castilla; path with invasive mammary and DCIS   Breast cancer of upper-inner quadrant of left female breast ([place]) 12/15/14   Completed radiation end of December and finished chemotherapy 2 weeks ago, Left breast invasive mammary carcinoma, T1cN64mc (1.5 cm); Grade 3, [name], [name], [name], .   Cataract    bilat    DDD (degenerative disc disease), lumbar    Lumbar, previously evaluated by Dr. HEarnestine Leys  Dementia ([facility]    Fibrocystic breast disease    prior biopsy   Glaucoma    Hard of hearing    wears hearing aides bilat    Hearing loss    History of cancer chemotherapy    History of radiation therapy    Hyperlipidemia, unspecified    Imbalance    Memory impairment    seen by Dr SManuella Ghazi possible post crainiotomy from radiation   Meningioma ([facility]    Left cavernous sinus meningioma, [facility].   Numbness and tingling  right hand    Osteoarthritis    Osteoporosis, post-menopausal    Personal history of chemotherapy    Personal history of radiation therapy    Shoulder pain, right    Wears glasses     PAST SURGICAL HISTORY :   Past Surgical History:  Procedure Laterality Date   [place]   left frontal/temporal   BREAST BIOPSY Left 12/15/14   confirmed DCIS   BREAST BIOPSY Left 01/08/2015   Procedure:  BREAST BIOPSY WITH NEEDLE LOCALIZATION;  Surgeon: [name], MD;  Location: [facility] ORS;  Service: General;  Laterality: Left;   BREAST EXCISIONAL BIOPSY Left 1997   BREAST LUMPECTOMY Left 01/08/2015   Procedure: LUMPECTOMY;  Surgeon: [name], MD;  Location: [facility] ORS;  Service: General;  Laterality: Left;   COLONOSCOPY  2010   Dr. [name]   ORIF ANKLE FRACTURE Right 08/05/2017   Procedure: OPEN REDUCTION INTERNAL FIXATION (ORIF) ANKLE FRACTURE;  Surgeon: Earnestine Leys, MD;  Location: [facility] ORS;  Service: Orthopedics;  Laterality: Right;   PORTACATH PLACEMENT Right 01/16/2015   Procedure: INSERTION PORT-A-CATH;  Surgeon: [name], MD;  Location: [facility] ORS;  Service: General;  Laterality: Right;   SENTINEL NODE BIOPSY Left 01/16/2015   Procedure: SENTINEL NODE BIOPSY;  Surgeon: [name], MD;  Location: [facility] ORS;  Service: General;  Laterality: Left;   TOTAL HIP ARTHROPLASTY Right 09/04/2015   Procedure: RIGHT TOTAL HIP ARTHROPLASTY ANTERIOR APPROACH;  Surgeon: [name], MD;  [facility];  Service: Orthopedics;  Laterality: Right;    FAMILY HISTORY :   Family History  Problem Relation Age of Onset   Breast cancer Sister 21   Addison's disease Mother    Hyperthyroidism Mother    Osteoarthritis Mother    Stroke Father    Heart disease Father    High blood pressure Father     SOCIAL HISTORY:   Social History   Tobacco Use   Smoking status: Former    Packs/day: 0.25    Years: 5.00    Pack years: 1.25    Types: Cigarettes    Quit date: 07/28/1972    Years since quitting: 49.1   Smokeless tobacco: Never  Vaping Use   Vaping Use: Never used  Substance Use Topics   Alcohol use: Yes    Alcohol/week: 1.0 - 2.0 standard drink    Types: 1 - 2 Glasses of wine per week    Comment: 1 Glass Wine / Night   Drug use: No    ALLERGIES:  is allergic to donepezil.  MEDICATIONS:  Current Outpatient Medications  Medication Sig Dispense Refill   ARTIFICIAL  TEARS 1 % ophthalmic solution Place 1 drop into both eyes daily.     celecoxib (CELEBREX) 200 MG capsule Take 200 mg by mouth daily.      levETIRAcetam (KEPPRA) 250 MG tablet 1 tablet daily at bedtime  5   Travoprost, BAK Free, (TRAVATAN) 0.004 % SOLN ophthalmic solution travoprost 0.004 % eye drops     Varenicline Tartrate (TYRVAYA) 0.03 MG/ACT SOLN Place into the nose.     acetaminophen (TYLENOL) 325 MG tablet Take 650 mg by mouth every 4 (four) hours as needed. Pain / increased temp. (Patient not taking: Reported on 09/24/2021)     albuterol (VENTOLIN HFA) 108 (90 Base) MCG/ACT inhaler Inhale 2 puffs into the lungs every 4 (four) hours as needed for wheezing or shortness of breath. (Patient not taking: Reported on  09/24/2021) 18 g 0   ascorbic acid (VITAMIN C) 500 MG tablet Take 1 tablet by mouth daily.     Calcium Carbonate-Vitamin D (CALTRATE 600+D PO) Take 1 tablet by mouth daily. (Patient not taking: Reported on 08/12/2021)     traMADol (ULTRAM) 50 MG tablet Take 1 tablet (50 mg total) by mouth 2 (two) times daily as needed for moderate pain. (Patient not taking: Reported on 08/12/2021) 60 tablet 0   vitamin E 400 UNIT capsule Take 400 Units by mouth daily.  (Patient not taking: Reported on 08/12/2021)     No current facility-administered medications for this visit.   Facility-Administered Medications Ordered in Other Visits  Medication Dose Route Frequency Provider Last Rate Last Admin   heparin lock flush 100 unit/mL  500 Units Intracatheter Once PRN Cammie Sickle, MD       sodium chloride flush (NS) 0.9 % injection 10 mL  10 mL Intravenous PRN Cammie Sickle, MD   10 mL at 07/07/18 0830    PHYSICAL EXAMINATION: ECOG PERFORMANCE STATUS: 2 - Symptomatic, <50% confined to bed  BP 112/74 (BP Location: Left Wrist, Patient Position: Sitting, Cuff Size: Small)    Pulse 77    Temp 98.3 F (36.8 C) (Tympanic)    Ht _0  (1.626 m)    Wt 170 lb 6.4 oz (77.3 kg)    SpO2 97%    BMI  29.25 kg/m   Filed Weights   09/24/21 1425  Weight: 170 lb 6.4 oz (77.3 kg)     Physical Exam Constitutional:      Comments: Patient is accompanied by husband.  In a wheelchair.  HENT:     Head: Normocephalic and atraumatic.     Mouth/Throat:     Pharynx: No oropharyngeal exudate.  Eyes:     Pupils: Pupils are equal, round, and reactive to light.  Cardiovascular:     Rate and Rhythm: Normal rate and regular rhythm.  Pulmonary:     Effort: Pulmonary effort is normal. No respiratory distress.     Breath sounds: Normal breath sounds. No wheezing.  Abdominal:     General: Bowel sounds are normal. There is no distension.     Palpations: Abdomen is soft. There is no mass.     Tenderness: There is no abdominal tenderness. There is no guarding or rebound.  Musculoskeletal:        General: No tenderness. Normal range of motion.     Cervical back: Normal range of motion and neck supple.  Skin:    General: Skin is warm.  Neurological:     Mental Status: She is alert and oriented to person, place, and time.  Psychiatric:        Mood and Affect: Affect normal.    LABORATORY DATA:  I have reviewed the data as listed    Component Value Date/Time   NA 137 09/24/2021 1405   K 3.9 09/24/2021 1405   CL 101 09/24/2021 1405   CO2 28 09/24/2021 1405   GLUCOSE 118 (H) 09/24/2021 1405   BUN 20 09/24/2021 1405   CREATININE 0.92 09/24/2021 1405   CALCIUM 8.6 (L) 09/24/2021 1405   PROT 7.0 09/24/2021 1405   ALBUMIN 3.9 09/24/2021 1405   AST 22 09/24/2021 1405   ALT 18 09/24/2021 1405   ALKPHOS 54 09/24/2021 1405   BILITOT <0.1 (L) 09/24/2021 1405   GFRNONAA >60 09/24/2021 1405   GFRAA >60 04/16/2020 0814    No results found for: SPEP, UPEP  Lab Results  Component Value Date   WBC 7.2 09/24/2021   NEUTROABS 5.2 09/24/2021   HGB 14.1 09/24/2021   HCT 43.7 09/24/2021   MCV 92.0 09/24/2021   PLT 271 09/24/2021      Chemistry      Component Value Date/Time   NA 137  09/24/2021 1405   K 3.9 09/24/2021 1405   CL 101 09/24/2021 1405   CO2 28 09/24/2021 1405   BUN 20 09/24/2021 1405   CREATININE 0.92 09/24/2021 1405      Component Value Date/Time   CALCIUM 8.6 (L) 09/24/2021 1405   ALKPHOS 54 09/24/2021 1405   AST 22 09/24/2021 1405   ALT 18 09/24/2021 1405   BILITOT <0.1 (L) 09/24/2021 1405       RADIOGRAPHIC STUDIES: I have personally reviewed the radiological images as listed and agreed with the findings in the report. No results found.   ASSESSMENT & PLAN:  Carcinoma of overlapping sites of left breast in female, estrogen receptor negative ([place]) #Metastatic breast cancer ER [name] HER-2/neu positive. JAN 2023- CT scan chest and pelvis NED; STABLE sclerosis of the manubrium.   # currently OFF Herceptin plus Perjeta  Given concerns of poor tolerance fatigue/logistical problems. Given overall stability of the disease/patient's moderate dementia-family preference I think is reasonable to hold off any further therapy at this time.  We will plan reevaluation clinically in about 6 weeks; follow-up scan will be ordered at next visit..   # Drop in ejection fraction-January 09, 2020 MUGA scan ejection fraction 47%; NOV 2022- STABLE at 53%-clinically stable.  Monitor for now.  # Left breast calcifications: [[name]; mammogram OCT, 2022]-stable indeterminate calcifications-no mass noted on the mammogram.  Awaiting mammogram in 6 months- April 2023.  [Discussed with Dr. Bary Castilla.   # dry eyes/ keratitis sicca- *herceptin-perjeta; not sure if casutive factor.  Continue moisturizing eyedrops-stable.  # Chronic back and hip pain/ gait instability-- On asper patch/ tramadol prn- STABLE.   # Elevated BG-post breakfast in the range of 188-;STABLE.APRIL 2022-  HbA1c-5.6; s/p eval with Desmond Lope Jan 2023.   DISPOSITION:  # Follow up-in 6 weeks; ; labs-cbc/cmp; ca27-29; CEA; ca-15-3;;port flush-;-.B       Orders Placed This Encounter  Procedures    Cancer antigen 27.29    Standing Status:   Future    Standing Expiration Date:   09/24/2022   Cancer antigen 15-3    Standing Status:   Future    Standing Expiration Date:   09/24/2022     All questions were answered. The patient knows to call the clinic with any problems, questions or concerns.      Cammie Sickle, MD 09/24/2021 3:47 PM

## 2021-09-24 NOTE — Assessment & Plan Note (Addendum)
#  Metastatic breast cancer ER [name] HER-2/neu positive. JAN 2023- CT scan chest and pelvis NED; STABLE sclerosis of the manubrium.   # currently OFF Herceptin plus Perjeta  Given concerns of poor tolerance fatigue/logistical problems. Given overall stability of the disease/patient's moderate dementia-family preference I think is reasonable to hold off any further therapy at this time.  We will plan reevaluation clinically in about 6 weeks; follow-up scan will be ordered at next visit..  # Drop in ejection fraction-January 09, 2020 MUGA scan ejection fraction 47%; NOV 2022- STABLE at 53%-clinically stable.  Monitor for now.  # Left breast calcifications: [[name]; mammogram OCT, 2022]-stable indeterminate calcifications-no mass noted on the mammogram.  Awaiting mammogram in 6 months- April 2023.  [Discussed with Dr. Bary Castilla.   # dry eyes/ keratitis sicca- *herceptin-perjeta; not sure if casutive factor.  Continue moisturizing eyedrops-stable.  # Chronic back and hip pain/ gait instability-- On asper patch/ tramadol prn- STABLE.   # Elevated BG-post breakfast in the range of 188-;STABLE.APRIL 2022-  HbA1c-5.6; s/p eval with Desmond Lope Jan 2023.   DISPOSITION:  # Follow up-in 6 weeks; ; labs-cbc/cmp; ca27-29; CEA; ca-15-3;;port flush-;-.B

## 2021-09-24 NOTE — Progress Notes (Signed)
C/o dry eyes and runny nose.  Dropped off urine sample to Dr. Eliberto Ivory today.

## 2021-09-25 LAB — CEA: CEA: 2.3 ng/mL (ref 0.0–4.7)

## 2021-09-25 LAB — CANCER ANTIGEN 27.29: CA 27.29: 15.1 U/mL (ref 0.0–38.6)

## 2021-09-25 LAB — CANCER ANTIGEN 15-3: CA 15-3: 13 U/mL (ref 0.0–25.0)

## 2021-10-07 ENCOUNTER — Ambulatory Visit
Admission: RE | Admit: 2021-10-07 | Discharge: 2021-10-07 | Disposition: A | Discharge status: Home / Self Care | Payer: Medicare Other | Source: Ambulatory Visit | Attending: General Surgery | Admitting: General Surgery

## 2021-10-07 ENCOUNTER — Other Ambulatory Visit: Payer: Self-pay

## 2021-10-07 DIAGNOSIS — Z853 Personal history of malignant neoplasm of breast: Secondary | ICD-10-CM | POA: Insufficient documentation

## 2021-11-04 ENCOUNTER — Other Ambulatory Visit: Payer: Self-pay

## 2021-11-04 DIAGNOSIS — Z171 Estrogen receptor negative status [ER-]: Secondary | ICD-10-CM

## 2021-11-05 ENCOUNTER — Inpatient Hospital Stay (HOSPITAL_BASED_OUTPATIENT_CLINIC_OR_DEPARTMENT_OTHER): Payer: Medicare Other | Admitting: Internal Medicine

## 2021-11-05 ENCOUNTER — Inpatient Hospital Stay: Payer: Medicare Other | Attending: Internal Medicine

## 2021-11-05 ENCOUNTER — Encounter: Payer: Self-pay | Admitting: Internal Medicine

## 2021-11-05 DIAGNOSIS — Z171 Estrogen receptor negative status [ER-]: Secondary | ICD-10-CM

## 2021-11-05 DIAGNOSIS — F0390 Unspecified dementia without behavioral disturbance: Secondary | ICD-10-CM | POA: Insufficient documentation

## 2021-11-05 DIAGNOSIS — C7951 Secondary malignant neoplasm of bone: Secondary | ICD-10-CM | POA: Diagnosis not present

## 2021-11-05 DIAGNOSIS — M3501 Sicca syndrome with keratoconjunctivitis: Secondary | ICD-10-CM | POA: Insufficient documentation

## 2021-11-05 DIAGNOSIS — R6889 Other general symptoms and signs: Secondary | ICD-10-CM | POA: Insufficient documentation

## 2021-11-05 DIAGNOSIS — C50812 Malignant neoplasm of overlapping sites of left female breast: Secondary | ICD-10-CM | POA: Insufficient documentation

## 2021-11-05 DIAGNOSIS — Z452 Encounter for adjustment and management of vascular access device: Secondary | ICD-10-CM | POA: Insufficient documentation

## 2021-11-05 DIAGNOSIS — Z95828 Presence of other vascular implants and grafts: Secondary | ICD-10-CM

## 2021-11-05 LAB — COMPREHENSIVE METABOLIC PANEL
ALT: 17 U/L (ref 0–44)
AST: 20 U/L (ref 15–41)
Albumin: 3.7 g/dL (ref 3.5–5.0)
Alkaline Phosphatase: 49 U/L (ref 38–126)
Anion gap: 6 (ref 5–15)
BUN: 17 mg/dL (ref 8–23)
CO2: 28 mmol/L (ref 22–32)
Calcium: 8.2 mg/dL — ABNORMAL LOW (ref 8.9–10.3)
Chloride: 101 mmol/L (ref 98–111)
Creatinine, Ser: 0.68 mg/dL (ref 0.44–1.00)
GFR, Estimated: >60 mL/min (ref >60)
Glucose, Bld: 107 mg/dL — ABNORMAL HIGH (ref 70–99)
Potassium: 4 mmol/L (ref 3.5–5.1)
Sodium: 135 mmol/L (ref 135–145)
Total Bilirubin: 0.3 mg/dL (ref 0.3–1.2)
Total Protein: 6.6 g/dL (ref 6.5–8.1)

## 2021-11-05 LAB — CBC WITH DIFFERENTIAL/PLATELET
Abs Immature Granulocytes: 0.03 10*3/uL (ref 0.00–0.07)
Basophils Absolute: 0 10*3/uL (ref 0.0–0.1)
Basophils Relative: 0 %
Eosinophils Absolute: 0.2 10*3/uL (ref 0.0–0.5)
Eosinophils Relative: 3 %
HCT: 43.2 % (ref 36.0–46.0)
Hemoglobin: 13.8 g/dL (ref 12.0–15.0)
Immature Granulocytes: 0 %
Lymphocytes Relative: 24 %
Lymphs Abs: 1.6 10*3/uL (ref 0.7–4.0)
MCH: 29.6 pg (ref 26.0–34.0)
MCHC: 31.9 g/dL (ref 30.0–36.0)
MCV: 92.7 fL (ref 80.0–100.0)
Monocytes Absolute: 0.6 10*3/uL (ref 0.1–1.0)
Monocytes Relative: 9 %
Neutro Abs: 4.3 10*3/uL (ref 1.7–7.7)
Neutrophils Relative %: 64 %
Platelets: 277 10*3/uL (ref 150–400)
RBC: 4.66 MIL/uL (ref 3.87–5.11)
RDW: 14.1 % (ref 11.5–15.5)
WBC: 6.7 10*3/uL (ref 4.0–10.5)
nRBC: 0 % (ref 0.0–0.2)

## 2021-11-05 MED ORDER — SODIUM CHLORIDE 0.9% FLUSH
10.0000 mL | INTRAVENOUS | Status: DC | PRN
  Administered 2021-11-05: 10 mL via INTRAVENOUS
  Filled 2021-11-05: qty 10

## 2021-11-05 MED ORDER — HEPARIN SOD (PORK) LOCK FLUSH 100 UNIT/ML IV SOLN
500.0000 [IU] | Freq: Once | INTRAVENOUS | Status: AC
  Administered 2021-11-05: 500 [IU] via INTRAVENOUS
  Filled 2021-11-05: qty 5

## 2021-11-05 NOTE — Addendum Note (Signed)
Addended by: Leeann Must on: 11/05/2021 04:13 PM ? ? Modules accepted: Orders ? ?

## 2021-11-05 NOTE — Assessment & Plan Note (Addendum)
#  Metastatic breast cancer ER [name] HER-2/neu positive. JAN 2023- CT scan chest and pelvis NED; STABLE sclerosis of the manubrium.  No evidence of any obvious clinical progression.  Stable ? ?#Patient continues to be off Herceptin perjeta given her/family concerns for tolerance to therapy/overall stable disease.  We will repeat imaging in 2 months.  Okay with calcium plus vitamin D ?? ?# Drop in ejection fraction-January 09, 2020 MUGA scan ejection fraction 47%; NOV 2022- STABLE at 53%-clinically stable.  Monitor for now. ? ?# Left breast calcifications: [[name]; mammogram OCT, 2022]-stable indeterminate calcifications-no mass noted on the mammogram.  Mammogram April 2023-stable calcifications.  Await repeat mammogram in October 2023.  [Discussed with Dr. Bary Castilla.  ? ?# dry eyes/ keratitis sicca- *herceptin-perjeta; not sure if casutive factor.  Continue moisturizing eyedrops-stable. ? ?# Chronic back and hip pain/ gait instability-- On asper patch/ tramadol prn- stable. ? ?# Elevated BG-post breakfast in the range of 107 today; stable. ? ?DISPOSITION:  ?# Follow up-in 2 months; ; labs-cbc/cmp; ca27-29; CEA; ca-15-3;port flush; CT CAP prior;-.B ? ? ? ?

## 2021-11-05 NOTE — Progress Notes (Signed)
[place] ?OFFICE PROGRESS NOTE ? ?Patient Care Team: ?[name], MD as PCP - General (Internal Medicine) ?[name], MD (General Surgery) ?Requested, Self ?Cammie Sickle, MD as Consulting Physician (Internal Medicine) ? ? Cancer Staging  ?No matching staging information was found for the patient. ? ? ?Oncology History Overview Note  ?# June 2016- LEFT BREAST CA;  [name] ; [name] ; [name]; Her 2 Neu POS; [place] from July OF 2016; [name]; adjuvant Herceptin [ Finished July 2017]; AUG 2017- Neratinib x5 days; DISCON sec to diarrhea ? ?# MID OCT 2018- Right breast mass-Bx- ER/PR-NEG; Her 2 NEU POSITIVE 1~2.5cm;  [?NEW primary] ? ?# MID-OCT 2018-METASTATIC RECURRENT-oh sternal mass; Left Ax LN [Bx]/periportal LN ? ?# OCT 25th 2018- TAXOL-HERCEPTIN-PERJETA; Jan 2019- CT PR; continue HP only; on HOLD since NOV 2019--sec to drop in EF/Sieziues ? ?# July 15th 2020- RE-STARTED HP ? ? ?-------------------------------------------------------------------  ?# MUGA scan- July 28th 2017- 67%.  December 2019-EF 53%; January 2020 EF-52% ? ?# chronic gait/balance issues ? ?# Seizures/petit-mal/ Dr.Shah-Keppra [name] [id number]] ? ?# June 2017- left breast Bx- fat necrosis [[name] ? ?# july 2017-  BRCA 1& 2- NEG.  ? ?MOLECULAR TESTING- F ONE- TPS- 0%;  ERB2 amplification; PI3K/RET amplification Others** ? ?# PALLIATIVE CARE: P ? ?--------------------------------------------------   ? ?DIAGNOSIS: [ OCT 0109]- REC/MET- BREAST CA ER/PR-NEG; her 2 POS ? ?STAGE: 4  ;GOALS: Palliative ? ?CURRENT/MOST RECENT THERAPY- Herceptin-Perjeta [C] ? ?  ?Carcinoma of overlapping sites of left breast in female, estrogen receptor negative ([place])  ? ?  ?INTERVAL HISTORY: Patient a poor historian given dementia.  Patient in a wheelchair.  Patient is accompanied by her husband today. ? ?PAMLEA FINDER 81 y.o.  female pleasant patient above history of metastatic  breast cancer "chemo holiday" [most recently Herceptin plus Perjet] is here for follow-up. ? ?Patient denies any worsening joint pains or bone pain.  Denies any new lumps or bumps.  No headaches.  No falls.  Continues to have oral gait instability. ? ? ?Review of Systems  ?Unable to perform ROS: Dementia  ? PAST MEDICAL HISTORY :  ?Past Medical History:  ?Diagnosis Date  ? Arthritis   ? Brain tumor ([place]) 1995  ? meningeoma  ? Breast cancer ([place]) 2018  ? left breast; surgery 01/11/15 with Dr. Bary Castilla; path with invasive mammary and DCIS  ? Breast cancer of upper-inner quadrant of left female breast ([place]) 12/15/14  ? Completed radiation end of December and finished chemotherapy 2 weeks ago, Left breast invasive mammary carcinoma, T1cN62mc (1.5 cm); Grade 3, [name], [name], [name], .  ? Cataract   ? bilat   ? DDD (degenerative disc disease), lumbar   ? Lumbar, previously evaluated by Dr. HEarnestine Leys ? [place]   ? Fibrocystic breast disease   ? prior biopsy  ? Glaucoma   ? Hard of hearing   ? wears hearing aides bilat   ? Hearing loss   ? History of cancer chemotherapy   ? History of radiation therapy   ? Hyperlipidemia, unspecified   ? Imbalance   ? Memory impairment   ? seen by Dr SManuella Ghazi possible post crainiotomy from radiation  ? [place] ([place]   ? Left cavernous sinus meningioma[facility].  ? Numbness and tingling   ? right hand   ?  Osteoarthritis   ? Osteoporosis, post-menopausal   ? Personal history of chemotherapy   ? Personal history of radiation therapy   ? Shoulder pain, right   ? Wears glasses   ? ? ?PAST SURGICAL HISTORY :   ?Past Surgical History:  ?Procedure Laterality Date  ? APPENDECTOMY  1950  ? BRAIN SURGERY  1995  ? left frontal/temporal  ? BREAST BIOPSY Left 12/15/14  ? confirmed DCIS  ? BREAST BIOPSY Left 01/08/2015  ? Procedure: BREAST BIOPSY WITH NEEDLE LOCALIZATION;  Surgeon: [name], MD;   Location: [facility] ORS;  Service: General;  Laterality: Left;  ? BREAST EXCISIONAL BIOPSY Left 1997  ? BREAST LUMPECTOMY Left 01/08/2015  ? Procedure: LUMPECTOMY;  Surgeon: [name], MD;  Location: [facility];  Service: General;  Laterality: Left;  ? COLONOSCOPY  2010  ? Dr. [name]  ? ORIF ANKLE FRACTURE Right 08/05/2017  ? Procedure: OPEN REDUCTION INTERNAL FIXATION (ORIF) ANKLE FRACTURE;  Surgeon: Earnestine Leys, MD;  Location: [facility];  Service: Orthopedics;  Laterality: Right;  ? PORTACATH PLACEMENT Right 01/16/2015  ? Procedure: INSERTION PORT-A-CATH;  Surgeon: [name], MD;  Location: [facility];  Service: General;  Laterality: Right;  ? SENTINEL NODE BIOPSY Left 01/16/2015  ? Procedure: SENTINEL NODE BIOPSY;  Surgeon: [name], MD;  Location: [facility];  Service: General;  Laterality: Left;  ? TOTAL HIP ARTHROPLASTY Right 09/04/2015  ? Procedure: RIGHT TOTAL HIP ARTHROPLASTY ANTERIOR APPROACH;  Surgeon: [name], MD;  [facility];  Service: Orthopedics;  Laterality: Right;  ? ? ?FAMILY HISTORY :   ?Family History  ?Problem Relation Age of Onset  ? Breast cancer Sister 62  ? Addison's disease Mother   ? Hyperthyroidism Mother   ? Osteoarthritis Mother   ? Stroke Father   ? Heart disease Father   ? High blood pressure Father   ? ? ?SOCIAL HISTORY:   ?Social History  ? ?Tobacco Use  ? Smoking status: Former  ?  Packs/day: 0.25  ?  Years: 5.00  ?  Pack years: 1.25  ?  Types: Cigarettes  ?  Quit date: 07/28/1972  ?  Years since quitting: 49.3  ? Smokeless tobacco: Never  ?Vaping Use  ? Vaping Use: Never used  ?Substance Use Topics  ? Alcohol use: Yes  ?  Alcohol/week: 1.0 - 2.0 standard drink  ?  Types: 1 - 2 Glasses of wine per week  ?  Comment: 1 Glass Wine / Night  ? Drug use: No  ? ? ?ALLERGIES:  is allergic to donepezil. ? ?MEDICATIONS:  ?Current Outpatient Medications  ?Medication Sig Dispense Refill  ? ARTIFICIAL TEARS 1 % ophthalmic solution Place 1 drop into both eyes daily.    ?  celecoxib (CELEBREX) 200 MG capsule Take 200 mg by mouth daily.     ? levETIRAcetam (KEPPRA) 250 MG tablet 1 tablet daily at bedtime  5  ? Travoprost, BAK Free, (TRAVATAN) 0.004 % SOLN ophthalmic solution travoprost 0.004 % eye drops    ? Varenicline Tartrate (TYRVAYA) 0.03 MG/ACT SOLN Place into the nose.    ? acetaminophen (TYLENOL) 325 MG tablet Take 650 mg by mouth every 4 (four) hours as needed. Pain / increased temp. (Patient not taking: Reported on 09/24/2021)    ? albuterol (VENTOLIN HFA) 108 (90 Base) MCG/ACT inhaler Inhale 2 puffs into the lungs every 4 (four) hours as needed for wheezing or shortness of breath. (Patient not taking: Reported on 09/24/2021) 18 g 0  ?  ascorbic acid (VITAMIN C) 500 MG tablet Take 1 tablet by mouth daily.    ? Calcium Carbonate-Vitamin D (CALTRATE 600+D PO) Take 1 tablet by mouth daily. (Patient not taking: Reported on 08/12/2021)    ? traMADol (ULTRAM) 50 MG tablet Take 1 tablet (50 mg total) by mouth 2 (two) times daily as needed for moderate pain. (Patient not taking: Reported on 08/12/2021) 60 tablet 0  ? vitamin E 400 UNIT capsule Take 400 Units by mouth daily.  (Patient not taking: Reported on 08/12/2021)    ? ?No current facility-administered medications for this visit.  ? ?Facility-Administered Medications Ordered in Other Visits  ?Medication Dose Route Frequency Provider Last Rate Last Admin  ? heparin lock flush 100 unit/mL  500 Units Intracatheter Once PRN Cammie Sickle, MD      ? sodium chloride flush (NS) 0.9 % injection 10 mL  10 mL Intravenous PRN Cammie Sickle, MD   10 mL at 07/07/18 0830  ? ? ?PHYSICAL EXAMINATION: ?ECOG PERFORMANCE STATUS: 2 - Symptomatic, <50% confined to bed ? ?BP (!) 110/56 (BP Location: Right Arm, Patient Position: Sitting, Cuff Size: Large)   Pulse 71   Temp 98.3 ?F (36.8 ?C) (Tympanic)   Ht _0  (1.626 m)   SpO2 96%   BMI 29.25 kg/m?  ? ?Filed Weights  ? ? ? ?Physical Exam ?Constitutional:   ?   Comments: Patient is  accompanied by husband.  In a wheelchair.  ?HENT:  ?   Head: Normocephalic and atraumatic.  ?   Mouth/Throat:  ?   Pharynx: No oropharyngeal exudate.  ?Eyes:  ?   Pupils: Pupils are equal, round, and reactive to li

## 2021-11-05 NOTE — Progress Notes (Signed)
Would like you opinion on what vitamins she should take. ?

## 2021-11-06 LAB — CANCER ANTIGEN 27.29: CA 27.29: 12.4 U/mL (ref 0.0–38.6)

## 2021-11-06 LAB — CEA: CEA: 1.9 ng/mL (ref 0.0–4.7)

## 2021-11-06 LAB — CANCER ANTIGEN 15-3: CA 15-3: 11 U/mL (ref 0.0–25.0)

## 2022-01-01 ENCOUNTER — Ambulatory Visit
Admission: RE | Admit: 2022-01-01 | Discharge: 2022-01-01 | Disposition: A | Discharge status: Home / Self Care | Payer: Medicare Other | Source: Ambulatory Visit | Attending: Internal Medicine | Admitting: Internal Medicine

## 2022-01-01 DIAGNOSIS — Z171 Estrogen receptor negative status [ER-]: Secondary | ICD-10-CM | POA: Insufficient documentation

## 2022-01-01 DIAGNOSIS — C50812 Malignant neoplasm of overlapping sites of left female breast: Secondary | ICD-10-CM | POA: Diagnosis present

## 2022-01-01 LAB — POCT I-STAT CREATININE: Creatinine, Ser: 0.9 mg/dL (ref 0.44–1.00)

## 2022-01-01 MED ORDER — IOHEXOL 300 MG/ML  SOLN
100.0000 mL | Freq: Once | INTRAMUSCULAR | Status: AC | PRN
  Administered 2022-01-01: 100 mL via INTRAVENOUS

## 2022-01-06 ENCOUNTER — Inpatient Hospital Stay: Payer: Medicare Other | Attending: Internal Medicine

## 2022-01-06 ENCOUNTER — Encounter: Payer: Self-pay | Admitting: Internal Medicine

## 2022-01-06 ENCOUNTER — Inpatient Hospital Stay (HOSPITAL_BASED_OUTPATIENT_CLINIC_OR_DEPARTMENT_OTHER): Payer: Medicare Other | Admitting: Internal Medicine

## 2022-01-06 DIAGNOSIS — C50812 Malignant neoplasm of overlapping sites of left female breast: Secondary | ICD-10-CM | POA: Diagnosis present

## 2022-01-06 DIAGNOSIS — Z171 Estrogen receptor negative status [ER-]: Secondary | ICD-10-CM | POA: Insufficient documentation

## 2022-01-06 DIAGNOSIS — Z95828 Presence of other vascular implants and grafts: Secondary | ICD-10-CM

## 2022-01-06 LAB — CBC WITH DIFFERENTIAL/PLATELET
Abs Immature Granulocytes: 0.02 10*3/uL (ref 0.00–0.07)
Basophils Absolute: 0 10*3/uL (ref 0.0–0.1)
Basophils Relative: 0 %
Eosinophils Absolute: 0.2 10*3/uL (ref 0.0–0.5)
Eosinophils Relative: 3 %
HCT: 43.4 % (ref 36.0–46.0)
Hemoglobin: 13.6 g/dL (ref 12.0–15.0)
Immature Granulocytes: 0 %
Lymphocytes Relative: 21 %
Lymphs Abs: 1.4 10*3/uL (ref 0.7–4.0)
MCH: 29.2 pg (ref 26.0–34.0)
MCHC: 31.3 g/dL (ref 30.0–36.0)
MCV: 93.1 fL (ref 80.0–100.0)
Monocytes Absolute: 0.6 10*3/uL (ref 0.1–1.0)
Monocytes Relative: 9 %
Neutro Abs: 4.5 10*3/uL (ref 1.7–7.7)
Neutrophils Relative %: 67 %
Platelets: 259 10*3/uL (ref 150–400)
RBC: 4.66 MIL/uL (ref 3.87–5.11)
RDW: 13.9 % (ref 11.5–15.5)
WBC: 6.7 10*3/uL (ref 4.0–10.5)
nRBC: 0 % (ref 0.0–0.2)

## 2022-01-06 LAB — COMPREHENSIVE METABOLIC PANEL
ALT: 17 U/L (ref 0–44)
AST: 20 U/L (ref 15–41)
Albumin: 3.8 g/dL (ref 3.5–5.0)
Alkaline Phosphatase: 47 U/L (ref 38–126)
Anion gap: 6 (ref 5–15)
BUN: 19 mg/dL (ref 8–23)
CO2: 28 mmol/L (ref 22–32)
Calcium: 8.3 mg/dL — ABNORMAL LOW (ref 8.9–10.3)
Chloride: 103 mmol/L (ref 98–111)
Creatinine, Ser: 0.67 mg/dL (ref 0.44–1.00)
GFR, Estimated: >60 mL/min (ref >60)
Glucose, Bld: 94 mg/dL (ref 70–99)
Potassium: 4 mmol/L (ref 3.5–5.1)
Sodium: 137 mmol/L (ref 135–145)
Total Bilirubin: 0.6 mg/dL (ref 0.3–1.2)
Total Protein: 6.7 g/dL (ref 6.5–8.1)

## 2022-01-06 MED ORDER — SODIUM CHLORIDE 0.9% FLUSH
10.0000 mL | Freq: Once | INTRAVENOUS | Status: AC
  Administered 2022-01-06: 10 mL via INTRAVENOUS
  Filled 2022-01-06: qty 10

## 2022-01-06 MED ORDER — HEPARIN SOD (PORK) LOCK FLUSH 100 UNIT/ML IV SOLN
500.0000 [IU] | Freq: Once | INTRAVENOUS | Status: AC
  Administered 2022-01-06: 500 [IU] via INTRAVENOUS
  Filled 2022-01-06: qty 5

## 2022-01-06 NOTE — Progress Notes (Signed)
Patient denies new problems/concerns today.   °

## 2022-01-06 NOTE — Assessment & Plan Note (Addendum)
#  Metastatic breast cancer ER [name] HER-2/neu positive. Currently on surveillance [ec to poor tolerance]- CT CAP- [June 8th, 2023]-  No findings of active malignancy.  # Patient continues to be off Herceptin perjeta [last NOV, 2022] given her/family concerns for tolerance to therapy/overall stable disease.   Okay with calcium plus vitamin D  # Drop in ejection fraction-January 09, 2020 MUGA scan ejection fraction 47%; NOV 2022- STABLE at 53%-clinically stable.  Monitor for now.  # Left breast calcifications: [[name]; mammogram OCT, 2022]-stable indeterminate calcifications-no mass noted on the mammogram.  Mammogram April 2023-stable calcifications.  Await repeat mammogram in October 2023.  [Discussed with Dr. Bary Castilla. STABLE.   # dry eyes/ keratitis sicca- *herceptin-perjeta; not sure if casutive factor.  Continue moisturizing eyedrops- IMPROVED/STABLE.   # Chronic back and hip pain/ gait instability-- On asper patch/ tramadol prn- STABLE.   # Elevated BG-post breakfast in the range of 107 today; STABLE  #Incidental findings on Imaging  JUNE  2023:CT- . Mild ground-glass density posteriorly in the right upper lobe; Aortic Atherosclerosis   Suspected pelvic floor laxity. Calcified uterine fibroids. 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 Multilevel lumbar impingement. Mild prominence of stool in the rectal vault without rectal wall thickening. I reviewed/discussed/counseled the patient.    DISPOSITION:  # Follow up-in 2 months; ; labs-cbc/cmp; ca27-29; CEA; ca-15-3;port flush; .B  # I reviewed the blood work- with the patient in detail; also reviewed the imaging independently [as summarized above]; and with the patient in detail.

## 2022-01-06 NOTE — Progress Notes (Signed)
[facility] OFFICE PROGRESS NOTE  Patient Care Team: [name], MD as PCP - General (Internal Medicine) Bary Castilla, [name], MD (General Surgery) Requested, Self Cammie Sickle, MD as Consulting Physician (Internal Medicine)   Cancer Staging  No matching staging information was found for the patient.   Oncology History Overview Note  # June 2016- LEFT BREAST CA;  [name] ; [name] ; [name]; Her 2 Neu POS; [place] from July OF 2016; [name]; adjuvant Herceptin [ Finished July 2017]; AUG 2017- Neratinib x5 days; DISCON sec to diarrhea  # MID OCT 2018- Right breast mass-Bx- ER/PR-NEG; Her 2 NEU POSITIVE 1~2.5cm;  [?NEW primary]  # MID-OCT 2018-METASTATIC RECURRENT-oh sternal mass; Left Ax LN [Bx]/periportal LN  # OCT 25th 2018- TAXOL-HERCEPTIN-PERJETA; Jan 2019- CT PR; continue HP only; on HOLD since NOV 2019--sec to drop in EF/Sieziues  # July 15th 2020- RE-STARTED HP   -------------------------------------------------------------------  # MUGA scan- July 28th 2017- 67%.  December 2019-EF 53%; January 2020 EF-52%  # chronic gait/balance issues  # Seizures/petit-mal/ Dr.Shah-Keppra [name] [id number]]  # June 2017- left breast Bx- fat necrosis [[name]  # july 2017-  BRCA 1& 2- NEG.   MOLECULAR TESTING- F ONE- TPS- 0%;  ERB2 amplification; PI3K/RET amplification Others**  # PALLIATIVE CARE: P  --------------------------------------------------    DIAGNOSIS: [ OCT 8299]- REC/MET- BREAST CA ER/PR-NEG; her 2 POS  STAGE: 4  ;GOALS: Palliative  CURRENT/MOST RECENT THERAPY- Herceptin-Perjeta [C]    Carcinoma of overlapping sites of left breast in female, estrogen receptor negative ([place])     INTERVAL HISTORY: Patient a poor historian given dementia.  Patient in a wheelchair.  Patient is accompanied by her husband today.  [name] 81 y.o.  female pleasant patient above history of metastatic  breast cancer "chemo holiday" [most recently Herceptin plus Perjet] is here for follow-up is here to review the results of the CT scans.  Patient denies any worsening joint pains or bone pain.  Denies any new lumps or bumps.  No headaches.  No falls.  Continues to have oral gait instability.   Review of Systems  Unable to perform ROS: Dementia    PAST MEDICAL HISTORY :  Past Medical History:  Diagnosis Date   Arthritis    Brain tumor ([place]) 1995   meningeoma   Breast cancer ([place]) 2018   left breast; surgery 01/11/15 with Dr. Bary Castilla; path with invasive mammary and DCIS   Breast cancer of upper-inner quadrant of left female breast ([place]) 12/15/14   Completed radiation end of December and finished chemotherapy 2 weeks ago, Left breast invasive mammary carcinoma, T1cN18mc (1.5 cm); Grade 3, [name], [name], [name], .   Cataract    bilat    DDD (degenerative disc disease), lumbar    Lumbar, previously evaluated by Dr. HEarnestine Leys  Dementia ([facility]    Fibrocystic breast disease    prior biopsy   Glaucoma    Hard of hearing    wears hearing aides bilat    Hearing loss    History of cancer chemotherapy    History of radiation therapy    Hyperlipidemia, unspecified    Imbalance    Memory impairment    seen by Dr SManuella Ghazi possible post crainiotomy from radiation   Meningioma ([facility]    Left cavernous sinus meningioma, [facility].  Numbness and tingling    right hand    Osteoarthritis    Osteoporosis, post-menopausal    Personal history of chemotherapy    Personal history of radiation therapy    Shoulder pain, right    Wears glasses     PAST SURGICAL HISTORY :   Past Surgical History:  Procedure Laterality Date   [place]   left frontal/temporal   BREAST BIOPSY Left 12/15/14   confirmed DCIS   BREAST BIOPSY Left 01/08/2015   Procedure: BREAST BIOPSY WITH NEEDLE  LOCALIZATION;  Surgeon: [name], MD;  Location: [facility] ORS;  Service: General;  Laterality: Left;   BREAST EXCISIONAL BIOPSY Left 1997   BREAST LUMPECTOMY Left 01/08/2015   Procedure: LUMPECTOMY;  Surgeon: [name], MD;  Location: [facility] ORS;  Service: General;  Laterality: Left;   COLONOSCOPY  2010   Dr. [name]   ORIF ANKLE FRACTURE Right 08/05/2017   Procedure: OPEN REDUCTION INTERNAL FIXATION (ORIF) ANKLE FRACTURE;  Surgeon: Earnestine Leys, MD;  Location: [facility] ORS;  Service: Orthopedics;  Laterality: Right;   PORTACATH PLACEMENT Right 01/16/2015   Procedure: INSERTION PORT-A-CATH;  Surgeon: [name], MD;  Location: [facility] ORS;  Service: General;  Laterality: Right;   SENTINEL NODE BIOPSY Left 01/16/2015   Procedure: SENTINEL NODE BIOPSY;  Surgeon: [name], MD;  Location: [facility] ORS;  Service: General;  Laterality: Left;   TOTAL HIP ARTHROPLASTY Right 09/04/2015   Procedure: RIGHT TOTAL HIP ARTHROPLASTY ANTERIOR APPROACH;  Surgeon: [name], MD;  Location: [facility];  Service: Orthopedics;  Laterality: Right;    FAMILY HISTORY :   Family History  Problem Relation Age of Onset   Breast cancer Sister 61   Addison's disease Mother    Hyperthyroidism Mother    Osteoarthritis Mother    Stroke Father    Heart disease Father    High blood pressure Father     SOCIAL HISTORY:   Social History   Tobacco Use   Smoking status: Former    Packs/day: 0.25    Years: 5.00    Total pack years: 1.25    Types: Cigarettes    Quit date: 07/28/1972    Years since quitting: 49.4   Smokeless tobacco: Never  Vaping Use   Vaping Use: Never used  Substance Use Topics   Alcohol use: Yes    Alcohol/week: 1.0 - 2.0 standard drink of alcohol    Types: 1 - 2 Glasses of wine per week    Comment: 1 Glass Wine / Night   Drug use: No    ALLERGIES:  is allergic to donepezil.  MEDICATIONS:  Current Outpatient Medications  Medication Sig Dispense Refill   ARTIFICIAL TEARS 1 %  ophthalmic solution Place 1 drop into both eyes daily.     ascorbic acid (VITAMIN C) 500 MG tablet Take 1 tablet by mouth daily.     celecoxib (CELEBREX) 200 MG capsule Take 200 mg by mouth daily.      levETIRAcetam (KEPPRA) 250 MG tablet 1 tablet daily at bedtime  5   Travoprost, BAK Free, (TRAVATAN) 0.004 % SOLN ophthalmic solution travoprost 0.004 % eye drops     Varenicline Tartrate (TYRVAYA) 0.03 MG/ACT SOLN Place into the nose.     acetaminophen (TYLENOL) 325 MG tablet Take 650 mg by mouth every 4 (four) hours as needed. Pain / increased temp. (Patient not taking: Reported on 09/24/2021)     albuterol (VENTOLIN HFA) 108 (90  Base) MCG/ACT inhaler Inhale 2 puffs into the lungs every 4 (four) hours as needed for wheezing or shortness of breath. (Patient not taking: Reported on 09/24/2021) 18 g 0   Calcium Carbonate-Vitamin D (CALTRATE 600+D PO) Take 1 tablet by mouth daily. (Patient not taking: Reported on 08/12/2021)     traMADol (ULTRAM) 50 MG tablet Take 1 tablet (50 mg total) by mouth 2 (two) times daily as needed for moderate pain. (Patient not taking: Reported on 08/12/2021) 60 tablet 0   vitamin E 400 UNIT capsule Take 400 Units by mouth daily.  (Patient not taking: Reported on 08/12/2021)     No current facility-administered medications for this visit.   Facility-Administered Medications Ordered in Other Visits  Medication Dose Route Frequency Provider Last Rate Last Admin   heparin lock flush 100 unit/mL  500 Units Intracatheter Once PRN Cammie Sickle, MD       sodium chloride flush (NS) 0.9 % injection 10 mL  10 mL Intravenous PRN Cammie Sickle, MD   10 mL at 07/07/18 0830    PHYSICAL EXAMINATION: ECOG PERFORMANCE STATUS: 2 - Symptomatic, <50% confined to bed  BP 104/60   Pulse 70   Temp (!) 96.8 F (36 C)   Wt 172 lb 6.4 oz (78.2 kg)   SpO2 97%   BMI 29.59 kg/m   Filed Weights   01/06/22 1400  Weight: 172 lb 6.4 oz (78.2 kg)     Physical  Exam Constitutional:      Comments: Patient is accompanied by husband.  In a wheelchair.  HENT:     Head: Normocephalic and atraumatic.     Mouth/Throat:     Pharynx: No oropharyngeal exudate.  Eyes:     Pupils: Pupils are equal, round, and reactive to light.  Cardiovascular:     Rate and Rhythm: Normal rate and regular rhythm.  Pulmonary:     Effort: Pulmonary effort is normal. No respiratory distress.     Breath sounds: Normal breath sounds. No wheezing.  Abdominal:     General: Bowel sounds are normal. There is no distension.     Palpations: Abdomen is soft. There is no mass.     Tenderness: There is no abdominal tenderness. There is no guarding or rebound.  Musculoskeletal:        General: No tenderness. Normal range of motion.     Cervical back: Normal range of motion and neck supple.  Skin:    General: Skin is warm.  Neurological:     Mental Status: She is alert and oriented to person, place, and time.  Psychiatric:        Mood and Affect: Affect normal.     LABORATORY DATA:  I have reviewed the data as listed    Component Value Date/Time   NA 137 01/06/2022 1409   K 4.0 01/06/2022 1409   CL 103 01/06/2022 1409   CO2 28 01/06/2022 1409   GLUCOSE 94 01/06/2022 1409   BUN 19 01/06/2022 1409   CREATININE 0.67 01/06/2022 1409   CALCIUM 8.3 (L) 01/06/2022 1409   PROT 6.7 01/06/2022 1409   ALBUMIN 3.8 01/06/2022 1409   AST 20 01/06/2022 1409   ALT 17 01/06/2022 1409   ALKPHOS 47 01/06/2022 1409   BILITOT 0.6 01/06/2022 1409   GFRNONAA >60 01/06/2022 1409   GFRAA >60 04/16/2020 0814    No results found for: "SPEP", "UPEP"  Lab Results  Component Value Date   WBC 6.7 01/06/2022   NEUTROABS  4.5 01/06/2022   HGB 13.6 01/06/2022   HCT 43.4 01/06/2022   MCV 93.1 01/06/2022   PLT 259 01/06/2022      Chemistry      Component Value Date/Time   NA 137 01/06/2022 1409   K 4.0 01/06/2022 1409   CL 103 01/06/2022 1409   CO2 28 01/06/2022 1409   BUN 19  01/06/2022 1409   CREATININE 0.67 01/06/2022 1409      Component Value Date/Time   CALCIUM 8.3 (L) 01/06/2022 1409   ALKPHOS 47 01/06/2022 1409   AST 20 01/06/2022 1409   ALT 17 01/06/2022 1409   BILITOT 0.6 01/06/2022 1409       RADIOGRAPHIC STUDIES: I have personally reviewed the radiological images as listed and agreed with the findings in the report. No results found.   ASSESSMENT & PLAN:  Carcinoma of overlapping sites of left breast in female, estrogen receptor negative ([place]) # Metastatic breast cancer ER [name] HER-2/neu positive. Currently on surveillance [ec to poor tolerance]- CT CAP- [June 8th, 2023]-  No findings of active malignancy.   # Patient continues to be off Herceptin perjeta [last NOV, 2022] given her/family concerns for tolerance to therapy/overall stable disease.   Okay with calcium plus vitamin D   # Drop in ejection fraction-January 09, 2020 MUGA scan ejection fraction 47%; NOV 2022- STABLE at 53%-clinically stable.  Monitor for now.  # Left breast calcifications: [[name]; mammogram OCT, 2022]-stable indeterminate calcifications-no mass noted on the mammogram.  Mammogram April 2023-stable calcifications.  Await repeat mammogram in October 2023.  [Discussed with Dr. Bary Castilla. STABLE.   # dry eyes/ keratitis sicca- *herceptin-perjeta; not sure if casutive factor.  Continue moisturizing eyedrops- IMPROVED/STABLE.   # Chronic back and hip pain/ gait instability-- On asper patch/ tramadol prn- STABLE.   # Elevated BG-post breakfast in the range of 107 today; STABLE  #Incidental findings on Imaging  JUNE  2023:CT- . Mild ground-glass density posteriorly in the right upper lobe; Aortic Atherosclerosis   Suspected pelvic floor laxity. Calcified uterine fibroids. 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 Multilevel lumbar impingement. Mild prominence of stool in the rectal vault without rectal wall thickening.  I reviewed/discussed/counseled the patient.    DISPOSITION:  # Follow up-in 2 months; ; labs-cbc/cmp; ca27-29; CEA; ca-15-3;port flush; .B  # I reviewed the blood work- with the patient in detail; also reviewed the imaging independently [as summarized above]; and with the patient in detail.          Orders Placed This Encounter  Procedures   CBC with Differential/Platelet    Standing Status:   Future    Standing Expiration Date:   01/07/2023   Comprehensive metabolic panel    Standing Status:   Future    Standing Expiration Date:   01/07/2023   CEA    Standing Status:   Future    Standing Expiration Date:   01/07/2023   Cancer antigen 27.29    Standing Status:   Future    Standing Expiration Date:   01/07/2023   Cancer antigen 15-3    Standing Status:   Future    Standing Expiration Date:   01/07/2023     All questions were answered. The patient knows to call the clinic with any problems, questions or concerns.      Cammie Sickle, MD 01/06/2022 3:43 PM

## 2022-01-07 LAB — CANCER ANTIGEN 27.29: CA 27.29: 13.9 U/mL (ref 0.0–38.6)

## 2022-01-07 LAB — CANCER ANTIGEN 15-3: CA 15-3: 11.7 U/mL (ref 0.0–25.0)

## 2022-02-17 ENCOUNTER — Other Ambulatory Visit: Payer: Self-pay

## 2022-02-25 ENCOUNTER — Other Ambulatory Visit: Payer: Self-pay

## 2022-03-10 ENCOUNTER — Inpatient Hospital Stay: Payer: Medicare Other | Attending: Internal Medicine

## 2022-03-10 DIAGNOSIS — Z452 Encounter for adjustment and management of vascular access device: Secondary | ICD-10-CM | POA: Insufficient documentation

## 2022-03-10 DIAGNOSIS — C50812 Malignant neoplasm of overlapping sites of left female breast: Secondary | ICD-10-CM | POA: Insufficient documentation

## 2022-03-10 DIAGNOSIS — Z95828 Presence of other vascular implants and grafts: Secondary | ICD-10-CM

## 2022-03-10 DIAGNOSIS — Z171 Estrogen receptor negative status [ER-]: Secondary | ICD-10-CM

## 2022-03-10 LAB — CBC WITH DIFFERENTIAL/PLATELET
Abs Immature Granulocytes: 0.02 10*3/uL (ref 0.00–0.07)
Basophils Absolute: 0 10*3/uL (ref 0.0–0.1)
Basophils Relative: 0 %
Eosinophils Absolute: 0.1 10*3/uL (ref 0.0–0.5)
Eosinophils Relative: 2 %
HCT: 44.6 % (ref 36.0–46.0)
Hemoglobin: 14 g/dL (ref 12.0–15.0)
Immature Granulocytes: 0 %
Lymphocytes Relative: 14 %
Lymphs Abs: 0.9 10*3/uL (ref 0.7–4.0)
MCH: 29.4 pg (ref 26.0–34.0)
MCHC: 31.4 g/dL (ref 30.0–36.0)
MCV: 93.7 fL (ref 80.0–100.0)
Monocytes Absolute: 0.4 10*3/uL (ref 0.1–1.0)
Monocytes Relative: 6 %
Neutro Abs: 5.1 10*3/uL (ref 1.7–7.7)
Neutrophils Relative %: 78 %
Platelets: 260 10*3/uL (ref 150–400)
RBC: 4.76 MIL/uL (ref 3.87–5.11)
RDW: 13.6 % (ref 11.5–15.5)
WBC: 6.6 10*3/uL (ref 4.0–10.5)
nRBC: 0 % (ref 0.0–0.2)

## 2022-03-10 LAB — COMPREHENSIVE METABOLIC PANEL
ALT: 27 U/L (ref 0–44)
AST: 28 U/L (ref 15–41)
Albumin: 3.7 g/dL (ref 3.5–5.0)
Alkaline Phosphatase: 50 U/L (ref 38–126)
Anion gap: 10 (ref 5–15)
BUN: 20 mg/dL (ref 8–23)
CO2: 27 mmol/L (ref 22–32)
Calcium: 8.6 mg/dL — ABNORMAL LOW (ref 8.9–10.3)
Chloride: 100 mmol/L (ref 98–111)
Creatinine, Ser: 0.83 mg/dL (ref 0.44–1.00)
GFR, Estimated: >60 mL/min (ref >60)
Glucose, Bld: 162 mg/dL — ABNORMAL HIGH (ref 70–99)
Potassium: 3.8 mmol/L (ref 3.5–5.1)
Sodium: 137 mmol/L (ref 135–145)
Total Bilirubin: 0.2 mg/dL — ABNORMAL LOW (ref 0.3–1.2)
Total Protein: 7 g/dL (ref 6.5–8.1)

## 2022-03-10 MED ORDER — SODIUM CHLORIDE 0.9% FLUSH
10.0000 mL | Freq: Once | INTRAVENOUS | Status: AC
  Administered 2022-03-10: 10 mL via INTRAVENOUS
  Filled 2022-03-10: qty 10

## 2022-03-10 MED ORDER — HEPARIN SOD (PORK) LOCK FLUSH 100 UNIT/ML IV SOLN
500.0000 [IU] | Freq: Once | INTRAVENOUS | Status: AC
  Administered 2022-03-10: 500 [IU] via INTRAVENOUS
  Filled 2022-03-10: qty 5

## 2022-03-11 LAB — CANCER ANTIGEN 27.29: CA 27.29: 10.8 U/mL (ref 0.0–38.6)

## 2022-03-11 LAB — CANCER ANTIGEN 15-3: CA 15-3: 14 U/mL (ref 0.0–25.0)

## 2022-03-11 LAB — CEA: CEA: 2 ng/mL (ref 0.0–4.7)

## 2022-04-07 ENCOUNTER — Other Ambulatory Visit: Payer: Self-pay | Admitting: General Surgery

## 2022-04-07 DIAGNOSIS — Z853 Personal history of malignant neoplasm of breast: Secondary | ICD-10-CM

## 2022-04-12 ENCOUNTER — Other Ambulatory Visit: Payer: Self-pay

## 2022-04-28 ENCOUNTER — Other Ambulatory Visit: Payer: Self-pay

## 2022-05-08 ENCOUNTER — Ambulatory Visit
Admission: RE | Admit: 2022-05-08 | Discharge: 2022-05-08 | Disposition: A | Discharge status: Home / Self Care | Payer: Medicare Other | Source: Ambulatory Visit | Attending: General Surgery | Admitting: General Surgery

## 2022-05-08 DIAGNOSIS — Z853 Personal history of malignant neoplasm of breast: Secondary | ICD-10-CM | POA: Diagnosis present

## 2022-05-09 ENCOUNTER — Other Ambulatory Visit: Payer: Self-pay

## 2022-05-20 DIAGNOSIS — M87052 Idiopathic aseptic necrosis of left femur: Secondary | ICD-10-CM | POA: Insufficient documentation

## 2023-03-23 ENCOUNTER — Encounter: Payer: Self-pay | Admitting: Student

## 2023-03-23 ENCOUNTER — Non-Acute Institutional Stay (SKILLED_NURSING_FACILITY): Payer: Medicare Other | Admitting: Student

## 2023-03-23 DIAGNOSIS — G301 Alzheimer's disease with late onset: Secondary | ICD-10-CM | POA: Diagnosis not present

## 2023-03-23 DIAGNOSIS — I427 Cardiomyopathy due to drug and external agent: Secondary | ICD-10-CM

## 2023-03-23 DIAGNOSIS — F0280 Dementia in other diseases classified elsewhere without behavioral disturbance: Secondary | ICD-10-CM

## 2023-03-23 DIAGNOSIS — M87052 Idiopathic aseptic necrosis of left femur: Secondary | ICD-10-CM

## 2023-03-23 DIAGNOSIS — D497 Neoplasm of unspecified behavior of endocrine glands and other parts of nervous system: Secondary | ICD-10-CM | POA: Diagnosis not present

## 2023-03-23 DIAGNOSIS — Z853 Personal history of malignant neoplasm of breast: Secondary | ICD-10-CM

## 2023-03-23 DIAGNOSIS — S82851S Displaced trimalleolar fracture of right lower leg, sequela: Secondary | ICD-10-CM

## 2023-03-23 DIAGNOSIS — Z96649 Presence of unspecified artificial hip joint: Secondary | ICD-10-CM

## 2023-03-23 DIAGNOSIS — G40909 Epilepsy, unspecified, not intractable, without status epilepticus: Secondary | ICD-10-CM | POA: Diagnosis not present

## 2023-03-23 DIAGNOSIS — T451X5A Adverse effect of antineoplastic and immunosuppressive drugs, initial encounter: Secondary | ICD-10-CM

## 2023-03-23 DIAGNOSIS — E785 Hyperlipidemia, unspecified: Secondary | ICD-10-CM

## 2023-03-23 HISTORY — DX: Cardiomyopathy due to drug and external agent: I42.7

## 2023-03-23 NOTE — Progress Notes (Signed)
Provider:   Location:  Other Nursing Home Room Number: [place] - 503 Place of Service:  SNF (31)  PCP: Earnestine Mealing, MD Patient Care Team: Earnestine Mealing, MD as PCP - General (Family Medicine) Lemar Livings, Merrily Pew, MD (General Surgery) Requested, Self Earna Coder, MD as Consulting Physician (Internal Medicine)  Extended Emergency Contact Information Primary Emergency Contact: Claudette Head Address: [address]          [place], [place] [address] [place] of [place] Home Phone: [phone] Mobile Phone: [phone] Relation: Spouse Secondary Emergency Contact: Ewell Poe [place] of [place] Home Phone: [phone] Mobile Phone: [phone] Relation: Son  Code Status: Full Code  Goals of Care: Advanced Directive information    01/06/2022    2:16 PM  Advanced Directives  Does Patient Have a Medical Advance Directive? Yes  Type of Estate agent of [place];Living will    Chief Complaint  Patient presents with   Acute Visit    Admission to [facility].     HPI: Patient is a 82 y.o. female seen today for admission to [facility] from the community.   They have lived at [facility] for ~1 year.   She has had gradual decline. She has had signficant loss of balance and strength. She has had a caregiver for many years. She is unable to transfer from chair to wheelchair or walker without assistance. Before she had a fall they made a move.   Dx of dementia with Dr. Sherryll Burger 3 years ago. Her breast surgeon concurred that she had some issues. Dr. Graciela Husbands she has been seeing him annually and on occasion. Overall health has been stable. She was seeing Dr. Sheppard Penton for urology.   Dementia:  Decline in engagement and mobility as well as activity. Reasd constantly. Watches law and order on TV as well baseball games. It's been years since running the household. She was mobile without assistance 2 years ago. Eats well and no  signs of dysphagia at this time. Good bowel function. She has had 1-2 UTIS and one in the last 6 months.   Hx of avascular necrosis, no plan for surgery.   Hx of Breast Cancer: Does not desire further imaging -- she was uncomfortable. Immunotherapy ended last year. No more mammograms.   ENT- She has cataracts and some high pressure, but no glaucoma. Vision is intact. Hearing - she has had regular exams at [facility].  no plan for surgery.   Mobility:  She fell twice at the [facility] residence - fell in the bedroom and then one evening while going to the bedroom. No neurologic causes or reasons for the fall, but somewhat buckled. Per chart review 3 falls since Easter 2024.   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 Long time since doing  [place] She went [place] [employer] and has Masters in [employer] and he was in [employer]. They have three children and she was at home for a few years with them.   Elocon is for the back of neck. Had shingles in July 2024.   Past Medical History:  Diagnosis Date   Arthritis    Brain tumor (HCC) 1995   meningeoma   Breast cancer (HCC) 2018   left breast; surgery 01/11/15 with Dr. Lemar Livings; path with invasive mammary and DCIS   Breast cancer of upper-inner quadrant of left female breast (HCC) 12/15/14   Completed radiation end of December and finished chemotherapy  2 weeks ago, Left breast invasive mammary carcinoma, T1cN56mic (1.5 cm); Grade 3, [name], [name], [name], .   Cataract    bilat    DDD (degenerative disc disease), lumbar    Lumbar, previously evaluated by Dr. 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   Numbness and tingling    right hand    Osteoarthritis    Osteoporosis, post-menopausal    Personal history of chemotherapy    Personal history of radiation therapy    Shoulder pain, right    Wears glasses    Past Surgical History:  Procedure Laterality Date   APPENDECTOMY  1950   BRAIN SURGERY  1995   left frontal/temporal   BREAST BIOPSY Left 12/15/14   confirmed DCIS   BREAST BIOPSY Left 01/08/2015   Procedure: BREAST BIOPSY WITH NEEDLE LOCALIZATION;  Surgeon: Earline Mayotte, MD;  Location: [facility] ORS;  Service: General;  Laterality: Left;   BREAST EXCISIONAL BIOPSY Left 1997   BREAST LUMPECTOMY Left 01/08/2015   Procedure: LUMPECTOMY;  Surgeon: Earline Mayotte, MD;  Location: [facility] ORS;  Service: General;  Laterality: Left;   COLONOSCOPY  2010   Dr. Markham Jordan   ORIF ANKLE FRACTURE Right 08/05/2017   Procedure: OPEN REDUCTION INTERNAL FIXATION (ORIF) ANKLE FRACTURE;  Surgeon: Deeann Saint, MD;  Location: [facility] ORS;  Service: Orthopedics;  Laterality: Right;   PORTACATH PLACEMENT Right 01/16/2015   Procedure: INSERTION PORT-A-CATH;  Surgeon: Earline Mayotte, MD;  Location: [facility] ORS;  Service: General;  Laterality: Right;   SENTINEL NODE BIOPSY Left 01/16/2015   Procedure: SENTINEL NODE BIOPSY;  Surgeon: Earline Mayotte, MD;  Location: [facility] ORS;  Service: General;  Laterality: Left;   TOTAL HIP ARTHROPLASTY Right 09/04/2015   Procedure: RIGHT TOTAL HIP ARTHROPLASTY ANTERIOR APPROACH;  Surgeon: Durene Romans, MD;  Location: [facility] ORS;  Service: Orthopedics;  Laterality: Right;    reports that she quit smoking about 50 years ago. Her smoking use included cigarettes. She started smoking about 55 years ago. She has a 1.3 pack-year smoking history. She has never used smokeless tobacco. She reports current alcohol use of about 1.0 - 2.0  standard drink of alcohol per week. She reports that she does not use drugs. Social History   Socioeconomic History   Marital status: Married    Spouse name: Casimiro Needle   Number of children: 4   Years of education: Not on file   Highest education level: Not on file  Occupational History   Not on file  Tobacco Use   Smoking status: Former    Current packs/day: 0.00    Average packs/day: 0.3 packs/day for 5.0 years (1.3 ttl pk-yrs)    Types: Cigarettes    Start date: 07/29/1967    Quit date: 07/28/1972    Years since quitting: 50.6   Smokeless tobacco: Never  Vaping Use   Vaping status: Never Used  Substance and Sexual Activity  Alcohol use: Yes    Alcohol/week: 1.0 - 2.0 standard drink of alcohol    Types: 1 - 2 Glasses of wine per week    Comment: 1 Glass Wine / Night   Drug use: No   Sexual activity: Not on file  Other Topics Concern   Not on file  Social History Narrative   Not on file   Social Determinants of Health   Financial Resource Strain: Not on file  Food Insecurity: Not on file  Transportation Needs: Not on file  Physical Activity: Not on file  Stress: Not on file  Social Connections: Not on file  Intimate Partner Violence: Not on file    Functional Status Survey:    Family History  Problem Relation Age of Onset   Breast cancer Sister 35   Addison's disease Mother    Hyperthyroidism Mother    Osteoarthritis Mother    Stroke Father    Heart disease Father    High blood pressure Father     Health Maintenance  Topic Date Due   DTaP/Tdap/Td (1 - Tdap) Never done   Zoster Vaccines- Shingrix (1 of 2) Never done   DEXA SCAN  Never done   COVID-19 Vaccine (4 - 2023-24 season) 03/28/2022   INFLUENZA VACCINE  02/26/2023   Pneumonia Vaccine 57+ Years old  Completed   HPV VACCINES  Aged Out    Allergies  Allergen Reactions   Donepezil Other (See Comments)    Other reaction(s): Other (See Comments) Nightmares    Outpatient Encounter Medications as  of 03/23/2023  Medication Sig   mometasone (ELOCON) 0.1 % lotion    [DISCONTINUED] valACYclovir (VALTREX) 1000 MG tablet Take 1,000 mg by mouth 3 (three) times daily.   acetaminophen (TYLENOL) 325 MG tablet Take 650 mg by mouth every 4 (four) hours as needed. Pain / increased temp. (Patient not taking: Reported on 09/24/2021)   ARTIFICIAL TEARS 1 % ophthalmic solution Place 1 drop into both eyes daily.   ascorbic acid (VITAMIN C) 500 MG tablet Take 1 tablet by mouth daily.   Calcium Carbonate-Vitamin D (CALTRATE 600+D PO) Take 1 tablet by mouth daily. (Patient not taking: Reported on 08/12/2021)   levETIRAcetam (KEPPRA) 250 MG tablet 1 tablet daily at bedtime   Travoprost, BAK Free, (TRAVATAN) 0.004 % SOLN ophthalmic solution travoprost 0.004 % eye drops   Varenicline Tartrate (TYRVAYA) 0.03 MG/ACT SOLN Place into the nose.   [DISCONTINUED] albuterol (VENTOLIN HFA) 108 (90 Base) MCG/ACT inhaler Inhale 2 puffs into the lungs every 4 (four) hours as needed for wheezing or shortness of breath. (Patient not taking: Reported on 09/24/2021)   [DISCONTINUED] celecoxib (CELEBREX) 200 MG capsule Take 200 mg by mouth daily.    [DISCONTINUED] chlorhexidine (PERIDEX) 0.12 % solution RINSE WITH 15 ML BY MOUTH AND SPIT OUT TWICE DAILY   [DISCONTINUED] clotrimazole-betamethasone (LOTRISONE) cream APPLY TOPICALLY TO THE AFFECTED AREA DAILY UNTIL IMPROVED   [DISCONTINUED] hydrochlorothiazide (HYDRODIURIL) 25 MG tablet    [DISCONTINUED] traMADol (ULTRAM) 50 MG tablet Take 1 tablet (50 mg total) by mouth 2 (two) times daily as needed for moderate pain. (Patient not taking: Reported on 08/12/2021)   [DISCONTINUED] vitamin E 400 UNIT capsule Take 400 Units by mouth daily.  (Patient not taking: Reported on 08/12/2021)   [DISCONTINUED] heparin lock flush 100 unit/mL    [DISCONTINUED] sodium chloride flush (NS) 0.9 % injection 10 mL    No facility-administered encounter medications on file as of 03/23/2023.    Review of  Systems  Vitals:   03/23/23 1017  BP: (!) 106/54  Pulse: 89  Resp: 20  Temp: (!) 97.4 F (36.3 C)  SpO2: 92%  Weight: 177 lb (80.3 kg)   Body mass index is 30.38 kg/m. Physical Exam Vitals reviewed.  Constitutional:      Appearance: Normal appearance.  Cardiovascular:     Rate and Rhythm: Normal rate and regular rhythm.     Pulses: Normal pulses.     Heart sounds: Normal heart sounds.  Pulmonary:     Effort: Pulmonary effort is normal.     Breath sounds: Normal breath sounds.  Abdominal:     General: Abdomen is flat.     Palpations: Abdomen is soft.  Musculoskeletal:     Comments: RLE 1+ pitting edema  Skin:    General: Skin is warm and dry.  Neurological:     Mental Status: She is alert.     Comments: Gives name and birth date and instead of current day/date, states it is October 2023. She is aware of her children and recognizes her husband.   Psychiatric:        Mood and Affect: Mood normal.        Behavior: Behavior normal.     Labs reviewed: Basic Metabolic Panel: No results for input(s): "NA", "K", "CL", "CO2", "GLUCOSE", "BUN", "CREATININE", "CALCIUM", "MG", "PHOS" in the last 8760 hours. Liver Function Tests: No results for input(s): "AST", "ALT", "ALKPHOS", "BILITOT", "PROT", "ALBUMIN" in the last 8760 hours. No results for input(s): "LIPASE", "AMYLASE" in the last 8760 hours. No results for input(s): "AMMONIA" in the last 8760 hours. CBC: No results for input(s): "WBC", "NEUTROABS", "HGB", "HCT", "MCV", "PLT" in the last 8760 hours. Cardiac Enzymes: No results for input(s): "CKTOTAL", "CKMB", "CKMBINDEX", "TROPONINI" in the last 8760 hours. BNP: Invalid input(s): "POCBNP" Lab Results  Component Value Date   HGBA1C 5.6 11/16/2020   No results found for: "TSH" Lab Results  Component Value Date   VITAMINB12 645 01/30/2021   Lab Results  Component Value Date   FOLATE 17.6 01/30/2021   Lab Results  Component Value Date   IRON 51 01/30/2021    TIBC 241 (L) 01/30/2021   FERRITIN 92 01/29/2021    Imaging and Procedures obtained prior to SNF admission: MM DIAG BREAST TOMO BILATERAL  Result Date: 05/08/2022 CLINICAL DATA:  LEFT lumpectomy in 2016. BI-RADS 3 follow-up of LEFT breast calcifications. Patient with Alzheimer's EXAM: DIGITAL DIAGNOSTIC BILATERAL MAMMOGRAM WITH TOMOSYNTHESIS TECHNIQUE: Bilateral digital diagnostic mammography and breast tomosynthesis was performed. Best images possible per technologist communication. Patient struggles to follow positioning and breathing instructions due to Alzheimer's. COMPARISON:  Previous exam(s). ACR Breast Density Category c: The breast tissue is heterogeneously dense, which may obscure small masses. FINDINGS: There is density and architectural distortion within the LEFT breast, consistent with postsurgical changes. These are stable in comparison to prior. There has been progressive coarsening of calcifications in a peripheral distribution, consistent with benign dystrophic rim calcifications. Spot magnification views were deferred due to patient mentation. No suspicious mass, distortion, or microcalcifications are identified to suggest presence of malignancy. IMPRESSION: No mammographic evidence of malignancy bilaterally. RECOMMENDATION: Mammogram in a year could be considered only if clinically appropriate for this patient with multiple comorbidities. Otherwise, recommend clinical follow-up. I have discussed the findings and recommendations with the patient's husband. If applicable, a reminder letter will be sent to the patient regarding the next appointment. BI-RADS CATEGORY  2: Benign. Electronically Signed   By: Meda Klinefelter M.D.  On: 05/08/2022 10:34   Assessment/Plan 1. Late onset Alzheimer's disease without behavioral disturbance [facility]) Patient with progression of underlying dementia. Less conversation, however, maintains some continued independence with feeding. Disoriented at  baseline. 2x falls with ambulation. Physical therapy to assess and maintain independence as she is able to participate. Patient is full code. Family will have a meeting on their on. Patient requires assitance with dressing, toileting, and max assist for transfers. High risk of falls. Wheelchair primarily. Sleeps a lot during the day. FAST 6C at this time.  2. Neoplasm of meninges Received radiation years ago, no further imaging or work up. Found in 1995.   3. Nonintractable epilepsy without status epilepticus, unspecified epilepsy type (HCC) Hx of 1x seizure and numerous syncopal events unclear if she has continued to have seizures. On Keppra for many years. Continue at this time.   4. Avascular necrosis of femoral head, left (HCC) Found on recent imaging. No further treatment or interventions  5. Closed trimalleolar fracture of right ankle, sequela RLE Fx in 2021 with some persistent pitting edema of the right lower leg. No pain. Continue to monitor.   6. History of breast cancer Previously taking oral chemotherapy. Was metastatic to bone. No longer on treatment. Cardiotoxicity led to cardiomyopathy.   7. Hyperlipidemia, unspecified hyperlipidemia type No medications, continue to monitor.   8. History of total hip replacement, unspecified laterality Could impact progression with therapy. NO pain at [facility] stime.   9. Chemotherapy induced cardiomyopathy (HCC) Last echocardiogram in 2018 with normal cardiac function, continue to monitor.    Family/ staff Communication: Nursing, Spouse, Daughter (HCPOA w/o spouse present).   Labs/tests ordered: CBC, BMP  I spent greater than 65 minutes for the care of this patient in face to face time, chart review, clinical documentation, patient education.

## 2023-03-29 ENCOUNTER — Other Ambulatory Visit: Payer: Self-pay

## 2023-04-08 ENCOUNTER — Telehealth (INDEPENDENT_AMBULATORY_CARE_PROVIDER_SITE_OTHER): Payer: Medicare Other | Admitting: Student

## 2023-04-08 DIAGNOSIS — G301 Alzheimer's disease with late onset: Secondary | ICD-10-CM | POA: Diagnosis not present

## 2023-04-08 DIAGNOSIS — F0280 Dementia in other diseases classified elsewhere without behavioral disturbance: Secondary | ICD-10-CM

## 2023-04-08 NOTE — Telephone Encounter (Signed)
HCPOA: Home Provider: [facility] with patient's daughter regarding MOST form. Discussed concern for paitent receiving IVF and FT. Daughter confirms that their discussion was that they would prefer if she did not have further surgeries at this point in her life and this stage of dementia and would defer on any permanent feeding option. Discussed with patient's in Dementia, fluids and FT due not prolong life and can cause significant discomfort.  I spent >11 minutes on the phone for goals of care conversation with the patient's HCPOA. Will update Code Status to DNR. MOST form and DNR in the facility.

## 2023-04-21 ENCOUNTER — Encounter: Payer: Self-pay | Admitting: Nurse Practitioner

## 2023-04-21 ENCOUNTER — Non-Acute Institutional Stay (SKILLED_NURSING_FACILITY): Payer: Self-pay | Admitting: Nurse Practitioner

## 2023-04-21 DIAGNOSIS — G301 Alzheimer's disease with late onset: Secondary | ICD-10-CM

## 2023-04-21 DIAGNOSIS — Z853 Personal history of malignant neoplasm of breast: Secondary | ICD-10-CM | POA: Diagnosis not present

## 2023-04-21 DIAGNOSIS — F0280 Dementia in other diseases classified elsewhere without behavioral disturbance: Secondary | ICD-10-CM

## 2023-04-21 DIAGNOSIS — G40909 Epilepsy, unspecified, not intractable, without status epilepticus: Secondary | ICD-10-CM

## 2023-04-21 DIAGNOSIS — M87052 Idiopathic aseptic necrosis of left femur: Secondary | ICD-10-CM

## 2023-04-21 NOTE — Progress Notes (Signed)
Location:  Other [facility] [place].  Nursing Home Room Number: [facility] 503A Place of Service:  SNF (31) Abbey Chatters, NP  PCP: Earnestine Mealing, MD  Patient Care Team: Earnestine Mealing, MD as PCP - General (Family Medicine) Lemar Livings, Merrily Pew, MD (General Surgery) Requested, Self Earna Coder, MD as Consulting Physician (Internal Medicine)  Extended Emergency Contact Information Primary Emergency Contact: Claudette Head Address: [address]          [place], [place] [address] Darden Amber of [place] Home Phone: [phone] Mobile Phone: [phone] Relation: Spouse Secondary Emergency Contact: Ewell Poe [place] of [place] Home Phone: [phone] Mobile Phone: [phone] Relation: Son  Goals of care: Advanced Directive information    04/21/2023   11:31 AM  Advanced Directives  Does Patient Have a Medical Advance Directive? Yes  Type of Estate agent of [place];Out of facility DNR (pink MOST or yellow form);Living will  Does patient want to make changes to medical advance directive? No - Patient declined  Copy of Healthcare Power of Attorney in Chart? Yes - validated most recent copy scanned in chart (See row information)     Chief Complaint  Patient presents with   Medical Management of Chronic Issues    Medical Management of Chronic Issues.     HPI:  Pt is a 82 y.o. female seen today for medical management of chronic disease.  Pt is now long term resident to [facility].  She has been doing well in the last months.  She is able to feed herself. Weight has been stable.  She was started on ketoconazole topical due to fungal infection of the toes.   She is 1 person assist to stand and pivot. No falls noted.   She continues to reads throughout the day.  She is very hard of hearing Staff has no concerns at this time.    Past Medical History:  Diagnosis Date   Arthritis    Brain  tumor (HCC) 1995   meningeoma   Breast cancer (HCC) 2018   left breast; surgery 01/11/15 with Dr. Lemar Livings; path with invasive mammary and DCIS   Breast cancer of upper-inner quadrant of left female breast (HCC) 12/15/14   Completed radiation end of December and finished chemotherapy 2 weeks ago, Left breast invasive mammary carcinoma, T1cN51mic (1.5 cm); Grade 3, [name], [name], [name], .   Cataract    bilat    DDD (degenerative disc disease), lumbar    Lumbar, previously evaluated by Dr. 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   Numbness and tingling    right hand    Osteoarthritis    Osteoporosis, post-menopausal    Personal history of chemotherapy    Personal history of radiation therapy    Shoulder pain, right    Wears glasses    Past Surgical History:  Procedure Laterality Date   APPENDECTOMY  1950  BRAIN SURGERY  1995   left frontal/temporal   BREAST BIOPSY Left 12/15/14   confirmed DCIS   BREAST BIOPSY Left 01/08/2015   Procedure: BREAST BIOPSY WITH NEEDLE LOCALIZATION;  Surgeon: Earline Mayotte, MD;  Location: [facility] ORS;  Service: General;  Laterality: Left;   BREAST EXCISIONAL BIOPSY Left 1997   BREAST LUMPECTOMY Left 01/08/2015   Procedure: LUMPECTOMY;  Surgeon: Earline Mayotte, MD;  Location: [facility] ORS;  Service: General;  Laterality: Left;   COLONOSCOPY  2010   Dr. Markham Jordan   ORIF ANKLE FRACTURE Right 08/05/2017   Procedure: OPEN REDUCTION INTERNAL FIXATION (ORIF) ANKLE FRACTURE;  Surgeon: Deeann Saint, MD;   Location: [facility] ORS;  Service: Orthopedics;  Laterality: Right;   PORTACATH PLACEMENT Right 01/16/2015   Procedure: INSERTION PORT-A-CATH;  Surgeon: Earline Mayotte, MD;  Location: [facility] ORS;  Service: General;  Laterality: Right;   SENTINEL NODE BIOPSY Left 01/16/2015   Procedure: SENTINEL NODE BIOPSY;  Surgeon: Earline Mayotte, MD;  Location: [facility] ORS;  Service: General;  Laterality: Left;   TOTAL HIP ARTHROPLASTY Right 09/04/2015   Procedure: RIGHT TOTAL HIP ARTHROPLASTY ANTERIOR APPROACH;  Surgeon: Durene Romans, MD;  Location: [facility] ORS;  Service: Orthopedics;  Laterality: Right;    Allergies  Allergen Reactions   Donepezil Other (See Comments)    Other reaction(s): Other (See Comments) Nightmares    Outpatient Encounter Medications as of 04/21/2023  Medication Sig   acetaminophen (TYLENOL) 325 MG tablet Take 650 mg by mouth every 8 (eight) hours as needed. Give Two tablets by mouth twice daily   ARTIFICIAL TEARS 1 % ophthalmic solution Place 1 drop into both eyes daily.   ketoconazole (NIZORAL) 2 % cream Apply 1 Application topically daily.   levETIRAcetam (KEPPRA) 250 MG tablet 1 tablet daily at bedtime   polyethylene glycol (MIRALAX / GLYCOLAX) 17 g packet Take 17 g by mouth daily as needed.   Travoprost, BAK Free, (TRAVATAN) 0.004 % SOLN ophthalmic solution travoprost 0.004 % eye drops   Zinc Oxide (TRIPLE PASTE) 12.8 % ointment Apply 1 Application topically. Every shift.   [DISCONTINUED] ascorbic acid (VITAMIN C) 500 MG tablet Take 1 tablet by mouth daily.   [DISCONTINUED] Calcium Carbonate-Vitamin D (CALTRATE 600+D PO) Take 1 tablet by mouth daily. (Patient not taking: Reported on 08/12/2021)   [DISCONTINUED] mometasone (ELOCON) 0.1 % lotion    [DISCONTINUED] Varenicline Tartrate (TYRVAYA) 0.03 MG/ACT SOLN Place into the nose.   No facility-administered encounter medications on file as of 04/21/2023.    Review of Systems  Unable to perform ROS: Dementia     Immunization History   Administered Date(s) Administered   Fluad Quad(high Dose 65+) 05/04/2019   Influenza Inj Mdck Quad Pf 05/21/2022   Influenza Split 03/28/2014   Influenza,inj,Quad PF,6+ Mos 04/27/2016   Influenza-Unspecified 05/01/2017   PFIZER Comirnaty(Gray Top)Covid-19 Tri-Sucrose Vaccine 08/04/2019, 08/25/2019, 12/07/2020   PFIZER(Purple Top)SARS-COV-2 Vaccination 08/04/2019, 08/25/2019   Pneumococcal Conjugate-13 11/27/2015   Pneumococcal Polysaccharide-23 06/02/2013   Tdap 06/02/2013   Zoster Recombinant(Shingrix) 05/17/2020   Pertinent  Health Maintenance Due  Topic Date Due   INFLUENZA VACCINE  02/26/2023   DEXA SCAN  Completed      08/12/2021    8:58 AM 09/24/2021    2:25 PM 11/05/2021    3:19 PM 01/06/2022    2:00 PM 03/23/2023    1:49 PM  Fall Risk  Falls in the past year?     1  Was there an injury with Fall?     0  Fall Risk Category Calculator     2  (RETIRED) Patient Fall Risk Level High fall risk High fall risk High fall risk High fall risk   Patient at Risk for Falls Due to     History of fall(s);Impaired balance/gait;Orthopedic patient;Impaired mobility   Functional Status Survey:    Vitals:   04/21/23 1123  BP: 127/78  Pulse: 94  Resp: 20  Temp: 97.9 F (36.6 C)  SpO2: 94%  Weight: 176 lb (79.8 kg)  Height: 5\' 4"  (1.626 m)   Body mass index is 30.21 kg/m. Physical Exam Constitutional:      General: She is not in acute distress.    Appearance: She is well-developed. She is not diaphoretic.  HENT:     Head: Normocephalic and atraumatic.     Mouth/Throat:     Pharynx: No oropharyngeal exudate.  Eyes:     Conjunctiva/sclera: Conjunctivae normal.     Pupils: Pupils are equal, round, and reactive to light.  Cardiovascular:     Rate and Rhythm: Normal rate and regular rhythm.     Heart sounds: Normal heart sounds.  Pulmonary:     Effort: Pulmonary effort is normal.     Breath sounds: Normal breath sounds.  Abdominal:     General: Bowel sounds are normal.      Palpations: Abdomen is soft.  Musculoskeletal:     Cervical back: Normal range of motion and neck supple.     Right lower leg: No edema.     Left lower leg: No edema.  Skin:    General: Skin is warm and dry.  Neurological:     Mental Status: She is alert.  Psychiatric:        Mood and Affect: Mood normal.     Labs reviewed: No results for input(s): "NA", "K", "CL", "CO2", "GLUCOSE", "BUN", "CREATININE", "CALCIUM", "MG", "PHOS" in the last 8760 hours. No results for input(s): "AST", "ALT", "ALKPHOS", "BILITOT", "PROT", "ALBUMIN" in the last 8760 hours. No results for input(s): "WBC", "NEUTROABS", "HGB", "HCT", "MCV", "PLT" in the last 8760 hours. No results found for: "TSH" Lab Results  Component Value Date   HGBA1C 5.6 11/16/2020   No results found for: "CHOL", "HDL", "LDLCALC", "LDLDIRECT", "TRIG", "CHOLHDL"  Significant Diagnostic Results in last 30 days:  No results found.  Assessment/Plan 1. Nonintractable epilepsy without status epilepticus, unspecified epilepsy type (HCC) -stable on keppra, no recent seizures.   2. Late onset Alzheimer's disease without behavioral disturbance (HCC) Stable, no acute changes in cognitive or functional status, continue supportive care.   3. History of breast cancer No current surveillance or treatment.   4. Avascular necrosis of femoral head, left (HCC) Stable, continues on scheduled tylenol. No reports of pain.    Janene Harvey. Biagio Borg [facility] [phone]

## 2023-04-27 ENCOUNTER — Encounter: Payer: Self-pay | Admitting: Student

## 2023-04-27 ENCOUNTER — Non-Acute Institutional Stay (SKILLED_NURSING_FACILITY): Payer: Medicare Other | Admitting: Student

## 2023-04-27 DIAGNOSIS — M87052 Idiopathic aseptic necrosis of left femur: Secondary | ICD-10-CM | POA: Diagnosis not present

## 2023-04-27 DIAGNOSIS — F0280 Dementia in other diseases classified elsewhere without behavioral disturbance: Secondary | ICD-10-CM

## 2023-04-27 DIAGNOSIS — G301 Alzheimer's disease with late onset: Secondary | ICD-10-CM | POA: Diagnosis not present

## 2023-04-27 DIAGNOSIS — G40909 Epilepsy, unspecified, not intractable, without status epilepticus: Secondary | ICD-10-CM

## 2023-04-27 NOTE — Progress Notes (Unsigned)
Location:  Other [facility]) Nursing Home Room Number: [facility] [address] Michaele Offer) Place of Service:  SNF [phone]) Provider:  Earnestine Mealing, MD  Patient Care Team: Earnestine Mealing, MD as PCP - General (Family Medicine) Lemar Livings, Merrily Pew, MD (General Surgery) Requested, Self Earna Coder, MD as Consulting Physician (Internal Medicine)  Extended Emergency Contact Information Primary Emergency Contact: Valli, Randol Address: [address]          [place], [place] [address] Darden [place] of [place] Home Phone: [phone] Mobile Phone: [phone] Relation: Spouse Secondary Emergency Contact: Ewell Poe [place] of [place] Home Phone: [phone] Mobile Phone: [phone] Relation: Son  Code Status:  DNR Goals of care: Advanced Directive information    04/27/2023   10:36 AM  Advanced Directives  Does Patient Have a Medical Advance Directive? Yes  Type of Estate agent of [place];Out of facility DNR (pink MOST or yellow form);Living will  Does patient want to make changes to medical advance directive? No - Patient declined  Copy of Healthcare Power of Attorney in Chart? Yes - validated most recent copy scanned in chart (See row information)     Chief Complaint  Patient presents with   Medical Management of Chronic Issues    Routine   Immunizations    Shingrix, Influenza, and Covid    HPI:  Pt is a 82 y.o. female seen today for medical management of chronic diseases.   Patient is sitting in the living room area reading a book. She states she is doing well and has no concerns. She is enjoying her book.   Nursing and physical therapy with concern regarding patient's avascular necrosis back   Past Medical History:  Diagnosis Date   Arthritis    Brain tumor (HCC) 1995   meningeoma   Breast cancer (HCC) 2018   left breast; surgery 01/11/15 with Dr. Lemar Livings; path with invasive mammary and DCIS   Breast cancer of  upper-inner quadrant of left female breast (HCC) 12/15/14   Completed radiation end of December and finished chemotherapy 2 weeks ago, Left breast invasive mammary carcinoma, T1cN32mic (1.5 cm); Grade 3, [name], [name], [name], .   Cataract    bilat    DDD (degenerative disc disease), lumbar    Lumbar, previously evaluated by Dr. 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   Numbness and tingling    right hand    Osteoarthritis    Osteoporosis, post-menopausal    Personal history of chemotherapy    Personal history of radiation therapy    Shoulder pain, right    Wears glasses    Past Surgical History:  Procedure Laterality Date   APPENDECTOMY  1950   BRAIN SURGERY  1995   left frontal/temporal   BREAST BIOPSY Left 12/15/14   confirmed DCIS   BREAST BIOPSY Left 01/08/2015   Procedure: BREAST BIOPSY WITH NEEDLE LOCALIZATION;  Surgeon: Earline Mayotte, MD;  Location: [facility] ORS;  Service: General;  Laterality: Left;   BREAST EXCISIONAL BIOPSY Left 1997   BREAST LUMPECTOMY Left 01/08/2015   Procedure: LUMPECTOMY;  Surgeon: Earline Mayotte, MD;  Location: [facility] ORS;  Service: General;  Laterality: Left;   COLONOSCOPY  2010   Dr. Markham Jordan   ORIF ANKLE FRACTURE Right 08/05/2017   Procedure: OPEN REDUCTION INTERNAL FIXATION (ORIF) ANKLE FRACTURE;  Surgeon: Deeann Saint, MD;  Location: [facility] ORS;  Service: Orthopedics;  Laterality: Right;   PORTACATH PLACEMENT Right 01/16/2015   Procedure: INSERTION PORT-A-CATH;  Surgeon: Earline Mayotte,  MD;  Location: [facility] ORS;  Service: General;  Laterality: Right;   SENTINEL NODE BIOPSY Left 01/16/2015   Procedure: SENTINEL NODE BIOPSY;  Surgeon: Earline Mayotte, MD;  Location: [facility] ORS;  Service: General;  Laterality: Left;   TOTAL HIP ARTHROPLASTY Right 09/04/2015   Procedure: RIGHT TOTAL HIP ARTHROPLASTY ANTERIOR APPROACH;  Surgeon: Durene Romans, MD;  Location: [facility];  Service: Orthopedics;  Laterality: Right;    Allergies  Allergen Reactions   Donepezil Other (See Comments)    Other reaction(s): Other (See Comments) Nightmares    Outpatient Encounter Medications as of 04/27/2023  Medication Sig   acetaminophen (TYLENOL) 325 MG tablet Take 650 mg by mouth every 8 (eight) hours as needed. Give Two tablets by mouth twice daily   ARTIFICIAL TEARS 1 % ophthalmic solution Place 1 drop into both eyes daily.   ketoconazole (NIZORAL) 2 % cream Apply 1 Application topically daily.   levETIRAcetam (KEPPRA) 250 MG tablet 1 tablet daily at bedtime   polyethylene glycol (MIRALAX / GLYCOLAX) 17 g packet Take 17 g by mouth daily as needed.   Travoprost, BAK Free, (TRAVATAN) 0.004 % SOLN ophthalmic solution travoprost 0.004 % eye drops   Zinc Oxide (TRIPLE PASTE) 12.8 % ointment Apply 1 Application topically. Every shift.   No facility-administered encounter medications on file as of 04/27/2023.    Review of Systems  Immunization History  Administered Date(s) Administered   Fluad Quad(high Dose 65+) 05/04/2019   Influenza Inj Mdck Quad Pf 05/21/2022   Influenza Split 03/28/2014   Influenza,inj,Quad PF,6+ Mos 04/27/2016   Influenza-Unspecified 05/01/2017   PFIZER Comirnaty(Gray Top)Covid-19 Tri-Sucrose Vaccine 08/04/2019, 08/25/2019, 12/07/2020   PFIZER(Purple Top)SARS-COV-2 Vaccination 08/04/2019, 08/25/2019   Pneumococcal Conjugate-13 11/27/2015   Pneumococcal Polysaccharide-23 06/02/2013   Tdap 06/02/2013   Zoster Recombinant(Shingrix) 05/17/2020   Pertinent  Health Maintenance Due   Topic Date Due   INFLUENZA VACCINE  02/26/2023   DEXA SCAN  Completed      08/12/2021    8:58 AM 09/24/2021    2:25 PM 11/05/2021    3:19 PM 01/06/2022    2:00 PM 03/23/2023    1:49 PM  Fall Risk  Falls in the past year?     1  Was there an injury with Fall?     0  Fall Risk Category Calculator     2  (RETIRED) Patient Fall Risk Level High fall risk High fall risk High fall risk High fall risk   Patient at Risk for Falls Due to     History of fall(s);Impaired balance/gait;Orthopedic patient;Impaired mobility   Functional Status Survey:    Vitals:   04/27/23 1029  BP: 127/78  Pulse: 94  Resp: 20  Temp: (!) 97.3 F (36.3 C)  SpO2: 94%  Weight: 176 lb (79.8 kg)  Height: 5\' 4"  (1.626 m)   Body mass index is 30.21 kg/m. Physical Exam Constitutional:  Appearance: Normal appearance.  Cardiovascular:     Rate and Rhythm: Normal rate and regular rhythm.     Pulses: Normal pulses.  Pulmonary:     Effort: Pulmonary effort is normal.  Abdominal:     General: Abdomen is flat.     Palpations: Abdomen is soft.  Neurological:     General: No focal deficit present.     Mental Status: She is alert.     Labs reviewed: No results for input(s): "NA", "K", "CL", "CO2", "GLUCOSE", "BUN", "CREATININE", "CALCIUM", "MG", "PHOS" in the last 8760 hours. No results for input(s): "AST", "ALT", "ALKPHOS", "BILITOT", "PROT", "ALBUMIN" in the last 8760 hours. No results for input(s): "WBC", "NEUTROABS", "HGB", "HCT", "MCV", "PLT" in the last 8760 hours. No results found for: "TSH" Lab Results  Component Value Date   HGBA1C 5.6 11/16/2020   No results found for: "CHOL", "HDL", "LDLCALC", "LDLDIRECT", "TRIG", "CHOLHDL"  Significant Diagnostic Results in last 30 days:  No results found.  Assessment/Plan Avascular necrosis of femoral head, left (HCC)  Late onset Alzheimer's disease without behavioral disturbance (HCC)  Nonintractable epilepsy without status epilepticus, unspecified  epilepsy type (HCC) Repeat x-ray of left hip to determine progression of avascular necrosis. NWB until results. Patient is adjusting well to new facility. Weight stable over the last month. No signs of seizure activity, continue Keppra.   Family/ staff Communication: nursing, physical therapy, MDT   Labs/tests ordered:  x-ray

## 2023-04-29 ENCOUNTER — Encounter: Payer: Self-pay | Admitting: Student

## 2023-04-30 ENCOUNTER — Other Ambulatory Visit: Payer: Self-pay

## 2023-05-13 ENCOUNTER — Encounter: Payer: Self-pay | Admitting: Student

## 2023-05-13 ENCOUNTER — Non-Acute Institutional Stay (SKILLED_NURSING_FACILITY): Payer: Medicare Other | Admitting: Student

## 2023-05-13 DIAGNOSIS — R635 Abnormal weight gain: Secondary | ICD-10-CM

## 2023-05-13 DIAGNOSIS — F0280 Dementia in other diseases classified elsewhere without behavioral disturbance: Secondary | ICD-10-CM | POA: Diagnosis not present

## 2023-05-13 DIAGNOSIS — S82851S Displaced trimalleolar fracture of right lower leg, sequela: Secondary | ICD-10-CM | POA: Diagnosis not present

## 2023-05-13 DIAGNOSIS — G301 Alzheimer's disease with late onset: Secondary | ICD-10-CM

## 2023-05-13 NOTE — Progress Notes (Unsigned)
Location:  [facility].  Nursing Home Room Number: [facility] 503[address] Place of Service:  SNF [phone]) Provider:  Earnestine Mealing, MD  Patient Care Team: Earnestine Mealing, MD as PCP - General (Family Medicine) Lemar Livings, Merrily Pew, MD (General Surgery) Requested, Self Earna Coder, MD as Consulting Physician (Internal Medicine)  Extended Emergency Contact Information Primary Emergency Contact: Kambra, Beachem Address: [address]          [place], [place] [address] [place] of [place] Home Phone: [phone] Mobile Phone: [phone] Relation: Spouse Secondary Emergency Contact: Ewell Poe [place] of [place] Home Phone: [phone] Mobile Phone: [phone] Relation: Son  Code Status:  DNR Goals of care: Advanced Directive information    05/13/2023   10:32 AM  Advanced Directives  Does Patient Have a Medical Advance Directive? Yes  Type of Estate agent of [place];Out of facility DNR (pink MOST or yellow form);Living will  Does patient want to make changes to medical advance directive? No - Patient declined  Copy of Healthcare Power of Attorney in Chart? Yes - validated most recent copy scanned in chart (See row information)     Chief Complaint  Patient presents with   Acute Visit    Leg Swelling.     HPI:  Pt is a 82 y.o. female seen today for an acute visit for Leg Swelling.    Past Medical History:  Diagnosis Date   Arthritis    Brain tumor (HCC) 1995   meningeoma   Breast cancer (HCC) 2018   left breast; surgery 01/11/15 with Dr. 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   Cataract    bilat    DDD (degenerative  disc disease), lumbar    Lumbar, previously evaluated by Dr. 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   Numbness and tingling    right hand    Osteoarthritis    Osteoporosis, post-menopausal    Personal history of chemotherapy    Personal history of radiation therapy    Shoulder pain, right    Wears glasses    Past Surgical History:  Procedure Laterality Date   APPENDECTOMY  1950   BRAIN SURGERY  1995   left frontal/temporal   BREAST BIOPSY Left 12/15/14   confirmed DCIS   BREAST BIOPSY Left 01/08/2015   Procedure: BREAST BIOPSY WITH NEEDLE LOCALIZATION;  Surgeon: Earline Mayotte, MD;  Location: [facility] ORS;  Service: General;  Laterality: Left;   BREAST EXCISIONAL BIOPSY Left 1997   BREAST LUMPECTOMY Left 01/08/2015   Procedure: LUMPECTOMY;  Surgeon: Earline Mayotte, MD;  Location: [facility] ORS;  Service: General;  Laterality: Left;   COLONOSCOPY  2010   Dr.  Elliot   ORIF ANKLE FRACTURE Right 08/05/2017   Procedure: OPEN REDUCTION INTERNAL FIXATION (ORIF) ANKLE FRACTURE;  Surgeon: Deeann Saint, MD;  Location: [facility] ORS;  Service: Orthopedics;  Laterality: Right;   PORTACATH PLACEMENT Right 01/16/2015   Procedure: INSERTION PORT-A-CATH;  Surgeon: Earline Mayotte, MD;  Location: [facility] ORS;  Service: General;  Laterality: Right;   SENTINEL NODE BIOPSY Left 01/16/2015   Procedure: SENTINEL NODE BIOPSY;  Surgeon: Earline Mayotte, MD;  Location: [facility] ORS;  Service: General;  Laterality: Left;   TOTAL HIP ARTHROPLASTY Right 09/04/2015   Procedure: RIGHT TOTAL HIP  ARTHROPLASTY ANTERIOR APPROACH;  Surgeon: Durene Romans, MD;  Location: [facility] ORS;  Service: Orthopedics;  Laterality: Right;    Allergies  Allergen Reactions   Donepezil Other (See Comments)    Other reaction(s): Other (See Comments) Nightmares    Outpatient Encounter Medications as of 05/13/2023  Medication Sig   acetaminophen (TYLENOL) 325 MG tablet Take 650 mg by mouth every 8 (eight) hours as needed. Give Two tablets by mouth twice daily   ARTIFICIAL TEARS 1 % ophthalmic solution Place 1 drop into both eyes daily.   ketoconazole (NIZORAL) 2 % cream Apply 1 Application topically daily.   levETIRAcetam (KEPPRA) 250 MG tablet 1 tablet daily at bedtime   polyethylene glycol (MIRALAX / GLYCOLAX) 17 g packet Take 17 g by mouth daily as needed.   Travoprost, BAK Free, (TRAVATAN) 0.004 % SOLN ophthalmic solution travoprost 0.004 % eye drops   Zinc Oxide (TRIPLE PASTE) 12.8 % ointment Apply 1 Application topically. Every shift.   No facility-administered encounter medications on file as of 05/13/2023.    Review of Systems  Immunization History  Administered Date(s) Administered   Fluad Quad(high Dose 65+) 05/04/2019   Influenza Inj Mdck Quad Pf 05/21/2022   Influenza Split 03/28/2014   Influenza,inj,Quad PF,6+ Mos 04/27/2016   Influenza-Unspecified 05/01/2017   PFIZER Comirnaty(Gray Top)Covid-19 Tri-Sucrose Vaccine 08/04/2019, 08/25/2019, 12/07/2020   PFIZER(Purple Top)SARS-COV-2 Vaccination 08/04/2019, 08/25/2019, 04/24/2023   Pneumococcal Conjugate-13 11/27/2015   Pneumococcal Polysaccharide-23 06/02/2013   Tdap 06/02/2013   Zoster Recombinant(Shingrix) 05/17/2020   Pertinent  Health Maintenance Due  Topic Date Due   INFLUENZA VACCINE  02/26/2023   DEXA SCAN  Completed      08/12/2021    8:58 AM 09/24/2021    2:25 PM 11/05/2021    3:19 PM 01/06/2022    2:00 PM 03/23/2023    1:49 PM  Fall Risk  Falls in the past year?     1  Was there an injury with Fall?     0  Fall Risk  Category Calculator     2  (RETIRED) Patient Fall Risk Level High fall risk High fall risk High fall risk High fall risk   Patient at Risk for Falls Due to     History of fall(s);Impaired balance/gait;Orthopedic patient;Impaired mobility   Functional Status Survey:    Vitals:   05/13/23 1020  BP: 127/78  Pulse: 94  Resp: 20  Temp: (!) 97 F (36.1 C)  SpO2: 94%  Weight: 182 lb 12.8 oz (82.9 kg)  Height: 5\' 4"  (1.626 m)   Body mass index is 31.38 kg/m. Physical Exam  Labs reviewed: No results for input(s): "NA", "K", "CL", "CO2", "GLUCOSE", "BUN", "CREATININE", "CALCIUM", "MG", "PHOS" in the last 8760 hours. No results for input(s): "AST", "ALT", "ALKPHOS", "BILITOT", "PROT", "ALBUMIN" in the last 8760 hours. No results for input(s): "WBC", "NEUTROABS", "HGB", "HCT", "MCV", "PLT" in the last 8760 hours. No  results found for: "TSH" Lab Results  Component Value Date   HGBA1C 5.6 11/16/2020   No results found for: "CHOL", "HDL", "LDLCALC", "LDLDIRECT", "TRIG", "CHOLHDL"  Significant Diagnostic Results in last 30 days:  No results found.  Assessment/Plan There are no diagnoses linked to this encounter.   Family/ staff Communication: ***  Labs/tests ordered:  ***

## 2023-05-14 LAB — BASIC METABOLIC PANEL
BUN: 17 (ref 4–21)
CO2: 31 — AB (ref 13–22)
Chloride: 99 (ref 99–108)
Creatinine: 0.8 (ref 0.5–1.1)
Glucose: 119
Potassium: 4.3 meq/L (ref 3.5–5.1)
Sodium: 139 (ref 137–147)

## 2023-05-14 LAB — COMPREHENSIVE METABOLIC PANEL
Albumin: 3.9 (ref 3.5–5.0)
Calcium: 9.1 (ref 8.7–10.7)
Globulin: 2.9
eGFR: 73

## 2023-05-14 LAB — HEPATIC FUNCTION PANEL
ALT: 21 U/L (ref 7–35)
AST: 17 (ref 13–35)
Alkaline Phosphatase: 70 (ref 25–125)
Bilirubin, Total: 0.4

## 2023-05-14 LAB — CBC: RBC: 4.8 (ref 3.87–5.11)

## 2023-05-14 LAB — CBC AND DIFFERENTIAL
HCT: 45 (ref 36–46)
Hemoglobin: 14.2 (ref 12.0–16.0)
Platelets: 293 10*3/uL (ref 150–400)
WBC: 7.2

## 2023-05-19 ENCOUNTER — Non-Acute Institutional Stay (SKILLED_NURSING_FACILITY): Payer: Medicare Other | Admitting: Nurse Practitioner

## 2023-05-19 ENCOUNTER — Encounter: Payer: Self-pay | Admitting: Nurse Practitioner

## 2023-05-19 DIAGNOSIS — M1610 Unilateral primary osteoarthritis, unspecified hip: Secondary | ICD-10-CM | POA: Diagnosis not present

## 2023-05-19 DIAGNOSIS — R635 Abnormal weight gain: Secondary | ICD-10-CM

## 2023-05-19 DIAGNOSIS — G301 Alzheimer's disease with late onset: Secondary | ICD-10-CM

## 2023-05-19 DIAGNOSIS — G40909 Epilepsy, unspecified, not intractable, without status epilepticus: Secondary | ICD-10-CM

## 2023-05-19 DIAGNOSIS — F0280 Dementia in other diseases classified elsewhere without behavioral disturbance: Secondary | ICD-10-CM

## 2023-05-19 NOTE — Progress Notes (Unsigned)
Location:  Other [facility].  Nursing Home Room Number: [facility] 503A Place of Service:  SNF (31) Abbey Chatters, NP  PCP: Earnestine Mealing, MD  Patient Care Team: Earnestine Mealing, MD as PCP - General (Family Medicine) Lemar Livings, Merrily Pew, MD (General Surgery) Requested, Self Earna Coder, MD as Consulting Physician (Internal Medicine)  Extended Emergency Contact Information Primary Emergency Contact: Claudette Head Address: [address]          [place], [place] [address] [place] of [place] Home Phone: [phone] Mobile Phone: [phone] Relation: Spouse Secondary Emergency Contact: Ewell Poe [place] of [place] Home Phone: [phone] Mobile Phone: [phone] Relation: Son  Goals of care: Advanced Directive information    05/19/2023    9:52 AM  Advanced Directives  Does Patient Have a Medical Advance Directive? Yes  Type of Estate agent of [place];Out of facility DNR (pink MOST or yellow form);Living will  Does patient want to make changes to medical advance directive? No - Patient declined  Copy of Healthcare Power of Attorney in Chart? Yes - validated most recent copy scanned in chart (See row information)     Chief Complaint  Patient presents with   Medical Management of Chronic Issues    Medical Management of Chronic Issues.     HPI:  Pt is a 82 y.o. female seen today for medical management of chronic disease.  Pt with progressive dementia. Staff reports she is doing well with the assistance of staff.  Staff reports she is eating well.  No pain noted.    Past Medical History:  Diagnosis Date   Arthritis    Brain tumor (HCC) 1995   meningeoma   Breast cancer (HCC) 2018   left breast; surgery 01/11/15 with Dr. Lemar Livings; path with invasive mammary and DCIS   Breast cancer of upper-inner quadrant of left female breast (HCC) 12/15/14   Completed radiation end of December and finished  chemotherapy 2 weeks ago, Left breast invasive mammary carcinoma, T1cN71mic (1.5 cm); Grade 3, [name], [name], [name], .   Cataract    bilat    DDD (degenerative disc disease), lumbar    Lumbar, previously evaluated by Dr. 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   Numbness and tingling    right hand    Osteoarthritis    Osteoporosis, post-menopausal    Personal history of chemotherapy    Personal history of radiation therapy    Shoulder pain, right    Wears glasses    Past Surgical History:  Procedure Laterality Date   APPENDECTOMY  1950   BRAIN SURGERY  1995   left frontal/temporal   BREAST BIOPSY Left 12/15/14   confirmed DCIS   BREAST BIOPSY Left 01/08/2015   Procedure: BREAST BIOPSY WITH NEEDLE LOCALIZATION;  Surgeon: Earline Mayotte, MD;  Location: [facility] ORS;  Service: General;  Laterality: Left;   BREAST EXCISIONAL BIOPSY Left 1997  BREAST LUMPECTOMY Left 01/08/2015   Procedure: LUMPECTOMY;  Surgeon: Earline Mayotte, MD;  Location: [facility] ORS;  Service: General;  Laterality: Left;   COLONOSCOPY  2010   Dr. Markham Jordan   ORIF ANKLE FRACTURE Right 08/05/2017   Procedure: OPEN REDUCTION INTERNAL FIXATION (ORIF) ANKLE FRACTURE;  Surgeon: Deeann Saint, MD;  Location: [facility] ORS;  Service: Orthopedics;  Laterality: Right;   PORTACATH PLACEMENT Right 01/16/2015   Procedure: INSERTION PORT-A-CATH;  Surgeon: Earline Mayotte, MD;  Location: [facility] ORS;  Service: General;  Laterality: Right;   SENTINEL NODE BIOPSY Left 01/16/2015    Procedure: SENTINEL NODE BIOPSY;  Surgeon: Earline Mayotte, MD;  Location: [facility] ORS;  Service: General;  Laterality: Left;   TOTAL HIP ARTHROPLASTY Right 09/04/2015   Procedure: RIGHT TOTAL HIP ARTHROPLASTY ANTERIOR APPROACH;  Surgeon: Durene Romans, MD;  [facility];  Service: Orthopedics;  Laterality: Right;    Allergies  Allergen Reactions   Donepezil Other (See Comments)    Other reaction(s): Other (See Comments) Nightmares    Outpatient Encounter Medications as of 05/19/2023  Medication Sig   acetaminophen (TYLENOL) 325 MG tablet Take 650 mg by mouth every 8 (eight) hours as needed. Give Two tablets by mouth twice daily   ARTIFICIAL TEARS 1 % ophthalmic solution Place 1 drop into both eyes daily.   levETIRAcetam (KEPPRA) 250 MG tablet 1 tablet daily at bedtime   polyethylene glycol (MIRALAX / GLYCOLAX) 17 g packet Take 17 g by mouth daily as needed. Give 1 scoop by mouth once daily every other day.   Travoprost, BAK Free, (TRAVATAN) 0.004 % SOLN ophthalmic solution travoprost 0.004 % eye drops   Zinc Oxide (TRIPLE PASTE) 12.8 % ointment Apply 1 Application topically. Every shift.   [DISCONTINUED] ketoconazole (NIZORAL) 2 % cream Apply 1 Application topically daily.   No facility-administered encounter medications on file as of 05/19/2023.    Review of Systems  Unable to perform ROS: Dementia     Immunization History  Administered Date(s) Administered   Fluad Quad(high Dose 65+) 05/04/2019   Influenza Inj Mdck Quad Pf 05/21/2022   Influenza Split 03/28/2014   Influenza,inj,Quad PF,6+ Mos 04/27/2016   Influenza-Unspecified 05/01/2017   PFIZER Comirnaty(Gray Top)Covid-19 Tri-Sucrose Vaccine 08/04/2019, 08/25/2019, 12/07/2020   PFIZER(Purple Top)SARS-COV-2 Vaccination 08/04/2019, 08/25/2019, 04/24/2023   Pneumococcal Conjugate-13 11/27/2015   Pneumococcal Polysaccharide-23 06/02/2013   Tdap 06/02/2013   Zoster Recombinant(Shingrix) 05/17/2020   Pertinent  Health  Maintenance Due  Topic Date Due   INFLUENZA VACCINE  02/26/2023   DEXA SCAN  Completed      08/12/2021    8:58 AM 09/24/2021    2:25 PM 11/05/2021    3:19 PM 01/06/2022    2:00 PM 03/23/2023    1:49 PM  Fall Risk  Falls in the past year?     1  Was there an injury with Fall?     0  Fall Risk Category Calculator     2  (RETIRED) Patient Fall Risk Level High fall risk High fall risk High fall risk High fall risk   Patient at Risk for Falls Due to     History of fall(s);Impaired balance/gait;Orthopedic patient;Impaired mobility   Functional Status Survey:    Vitals:   05/19/23 0946  BP: 127/78  Pulse: 94  Resp: 20  Temp: (!) 97 F (36.1 C)  SpO2: 94%  Weight: 182 lb 12.8 oz (82.9 kg)  Height: 5\' 4"  (1.626 m)   Body mass index is 31.38 kg/m. Physical Exam Constitutional:  General: She is not in acute distress.    Appearance: She is well-developed. She is not diaphoretic.  HENT:     Head: Normocephalic and atraumatic.     Mouth/Throat:     Pharynx: No oropharyngeal exudate.  Eyes:     Conjunctiva/sclera: Conjunctivae normal.     Pupils: Pupils are equal, round, and reactive to light.  Cardiovascular:     Rate and Rhythm: Normal rate and regular rhythm.     Heart sounds: Normal heart sounds.  Pulmonary:     Effort: Pulmonary effort is normal.     Breath sounds: Normal breath sounds.  Abdominal:     General: Bowel sounds are normal.     Palpations: Abdomen is soft.  Musculoskeletal:     Cervical back: Normal range of motion and neck supple.     Right lower leg: No edema.     Left lower leg: No edema.  Skin:    General: Skin is warm and dry.  Neurological:     Mental Status: She is alert. She is disoriented.     Motor: Weakness present.     Gait: Gait abnormal.  Psychiatric:        Mood and Affect: Mood normal.     Labs reviewed: Recent Labs    05/14/23 0000  NA 139  K 4.3  CL 99  CO2 31*  BUN 17  CREATININE 0.8  CALCIUM 9.1   Recent Labs     05/14/23 0000  AST 17  ALT 21  ALKPHOS 70  ALBUMIN 3.9   Recent Labs    05/14/23 0000  WBC 7.2  HGB 14.2  HCT 45  PLT 293   No results found for: "TSH" Lab Results  Component Value Date   HGBA1C 5.6 11/16/2020   No results found for: "CHOL", "HDL", "LDLCALC", "LDLDIRECT", "TRIG", "CHOLHDL"  Significant Diagnostic Results in last 30 days:  No results found.  Assessment/Plan  1. 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   3. Nonintractable epilepsy without status epilepticus, unspecified epilepsy type (HCC) -no recent seizure, continues on keppra  4. Primary localized osteoarthritis of pelvic region and thigh -pain stable, continues tylenol twice daily scheduled and PRN if needed  4.weight gain Will continue to monitor, she is eating well. No significant edema noted.   Janene Harvey. Biagio Borg [facility] [phone]

## 2023-05-26 ENCOUNTER — Encounter: Payer: Self-pay | Admitting: Nurse Practitioner

## 2023-05-26 ENCOUNTER — Non-Acute Institutional Stay (INDEPENDENT_AMBULATORY_CARE_PROVIDER_SITE_OTHER): Payer: Self-pay | Admitting: Nurse Practitioner

## 2023-05-26 DIAGNOSIS — Z0000 Encounter for general adult medical examination without abnormal findings: Secondary | ICD-10-CM

## 2023-05-26 NOTE — Patient Instructions (Addendum)
  Ms. Brack , Thank you for taking time to come for your Medicare Wellness Visit. I appreciate your ongoing commitment to your health goals. Please review the following plan we discussed and let me know if I can assist you in the future.   This is a list of the screening recommended for you and due dates:  Health Maintenance  Topic Date Due   Zoster (Shingles) Vaccine (2 of 2) 07/12/2020   DTaP/Tdap/Td vaccine (2 - Td or Tdap) 06/03/2023   COVID-19 Vaccine (9 - 2023-24 season) 06/19/2023   Pneumonia Vaccine  Completed   Flu Shot  Completed   DEXA scan (bone density measurement)  Completed   HPV Vaccine  Aged Out   Please check on shingles vaccine and let [facility] know if you would like her to get second dose.  First was given 05/17/2020

## 2023-05-26 NOTE — Progress Notes (Signed)
Subjective:   [name] is a 82 y.o. female who presents for Medicare Annual (Subsequent) preventive examination.  Visit Complete: In person at [facility]    Cardiac Risk Factors include: advanced age (>38men, >69 women);sedentary lifestyle;obesity (BMI >30kg/m2)     Objective:    Today's Vitals   05/26/23 1134 05/26/23 1238  BP: 120/71   Pulse: 87   Resp: 16   Temp: (!) 97.1 F (36.2 C)   SpO2: 94%   Weight: 182 lb 12.8 oz (82.9 kg)   Height: 5\' 4"  (1.626 m)   PainSc:  0-No pain   Body mass index is 31.38 kg/m.     05/26/2023   11:39 AM 05/19/2023    9:52 AM 05/13/2023   10:32 AM 04/27/2023   10:36 AM 04/21/2023   11:31 AM 01/06/2022    2:16 PM 11/05/2021    3:14 PM  Advanced Directives  Does Patient Have a Medical Advance Directive? Yes Yes Yes Yes Yes Yes Yes  Type of Estate agent of [place];Out of facility DNR (pink MOST or yellow form);Living will Healthcare Power of [place];Out of facility DNR (pink MOST or yellow form);Living will Healthcare Power of [place];Out of facility DNR (pink MOST or yellow form);Living will Healthcare Power of [place];Out of facility DNR (pink MOST or yellow form);Living will Healthcare Power of [place];Out of facility DNR (pink MOST or yellow form);Living will Healthcare Power of [place];Living will Healthcare Power of [place];Living will  Does patient want to make changes to medical advance directive? No - Patient declined No - Patient declined No - Patient declined No - Patient declined No - Patient declined    Copy of Healthcare Power of Attorney in Chart? Yes - validated most recent copy scanned in chart (See row information) Yes - validated most recent copy scanned in chart (See row information) Yes - validated most recent copy scanned in chart (See row information) Yes - validated most recent copy scanned in chart (See row information) Yes - validated most recent copy scanned in chart (See row  information)      Current Medications (verified) Outpatient Encounter Medications as of 05/26/2023  Medication Sig   acetaminophen (TYLENOL) 325 MG tablet Take 650 mg by mouth every 8 (eight) hours as needed. Give Two tablets by mouth twice daily   ARTIFICIAL TEARS 1 % ophthalmic solution Place 1 drop into both eyes daily.   levETIRAcetam (KEPPRA) 250 MG tablet 1 tablet daily at bedtime   polyethylene glycol (MIRALAX / GLYCOLAX) 17 g packet Take 17 g by mouth daily as needed. Give 1 scoop by mouth once daily every other day.   Travoprost, BAK Free, (TRAVATAN) 0.004 % SOLN ophthalmic solution Place 1 drop into both eyes at bedtime.   Zinc Oxide (TRIPLE PASTE) 12.8 % ointment Apply 1 Application topically. Every shift.   No facility-administered encounter medications on file as of 05/26/2023.    Allergies (verified) Donepezil   History: Past Medical History:  Diagnosis Date   Arthritis    Brain tumor (HCC) 1995   meningeoma   Breast cancer (HCC) 2018   left breast; surgery 01/11/15 with Dr. 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   Cataract  bilat    DDD (degenerative disc disease), lumbar    Lumbar, previously evaluated by Dr. 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    Numbness and tingling    right hand    Osteoarthritis    Osteoporosis, post-menopausal    Personal history of chemotherapy    Personal history of radiation therapy    Shoulder pain, right    Wears glasses    Past Surgical History:  Procedure Laterality Date   APPENDECTOMY  1950   BRAIN SURGERY  1995   left frontal/temporal   BREAST BIOPSY Left 12/15/14   confirmed DCIS   BREAST BIOPSY Left 01/08/2015   Procedure: BREAST BIOPSY WITH NEEDLE LOCALIZATION;  Surgeon: Earline Mayotte, MD;  Location: [facility] ORS;  Service: General;  Laterality: Left;   BREAST EXCISIONAL BIOPSY Left 1997   BREAST LUMPECTOMY Left 01/08/2015   Procedure: LUMPECTOMY;  Surgeon: Earline Mayotte, MD;  Location: [facility] ORS;  Service: General;  Laterality: Left;   COLONOSCOPY  2010   Dr. Markham Jordan   ORIF ANKLE FRACTURE Right 08/05/2017   Procedure: OPEN REDUCTION INTERNAL FIXATION (ORIF) ANKLE FRACTURE;  Surgeon: Deeann Saint, MD;  Location: [facility] ORS;  Service: Orthopedics;  Laterality: Right;   PORTACATH PLACEMENT Right 01/16/2015   Procedure: INSERTION PORT-A-CATH;  Surgeon: Earline Mayotte, MD;  Location: [facility] ORS;  Service: General;  Laterality: Right;   SENTINEL NODE BIOPSY Left 01/16/2015   Procedure: SENTINEL NODE BIOPSY;  Surgeon: Earline Mayotte, MD;  Location: [facility] ORS;  Service: General;  Laterality: Left;   TOTAL HIP ARTHROPLASTY Right 09/04/2015   Procedure: RIGHT TOTAL HIP ARTHROPLASTY ANTERIOR APPROACH;  Surgeon: Durene Romans, MD;  Location: [facility];  Service: Orthopedics;  Laterality: Right;   Family History  Problem Relation Age of Onset   Breast cancer Sister 55   Addison's disease Mother    Hyperthyroidism Mother    Osteoarthritis Mother    Stroke Father    Heart disease Father    High blood pressure Father    Social History   Socioeconomic History   Marital status: Married    Spouse name: Casimiro Needle   Number of children: 4   Years of education: Not on file   Highest education level: Not on  file  Occupational History   Not on file  Tobacco Use   Smoking status: Former    Current packs/day: 0.00    Average packs/day: 0.3 packs/day for 5.0 years (1.3 ttl pk-yrs)    Types: Cigarettes    Start date: 07/29/1967    Quit date: 07/28/1972    Years since quitting: 50.8   Smokeless tobacco: Never  Vaping Use   Vaping status: Never Used  Substance and Sexual Activity   Alcohol use: Yes    Alcohol/week: 1.0 - 2.0 standard drink of alcohol    Types: 1 - 2 Glasses of wine per week    Comment: 1 Glass Wine / Night   Drug use: No  Sexual activity: Not on file  Other Topics Concern   Not on file  Social History Narrative   Not on file   Social Determinants of Health   Financial Resource Strain: Not on file  Food Insecurity: Not on file  Transportation Needs: Not on file  Physical Activity: Not on file  Stress: Not on file  Social Connections: Not on file    Tobacco Counseling Counseling given: Not Answered   Clinical Intake:  Pre-visit preparation completed: Yes  Pain : No/denies pain Pain Score: 0-No pain     BMI - recorded: 31 Nutritional Status: BMI > 30  Obese Nutritional Risks: Other (Comment) (dementia)  How often do you need to have someone help you when you read instructions, pamphlets, or other written materials from your doctor or pharmacy?: 5 - Always         Activities of Daily Living    05/26/2023   12:37 PM  In your present state of health, do you have any difficulty performing the following activities:  Hearing? 1  Vision? 0  Difficulty concentrating or making decisions? 1  Walking or climbing stairs? 1  Dressing or bathing? 1  Doing errands, shopping? 1  Preparing Food and eating ? Y  Using the Toilet? Y  In the past six months, have you accidently leaked urine? Y  Do you have problems with loss of bowel control? Y  Managing your Medications? Y  Managing your Finances? Y  Housekeeping or managing your Housekeeping? Y    Patient  Care Team: Earnestine Mealing, MD as PCP - General (Family Medicine) Lemar Livings, Merrily Pew, MD (General Surgery) Requested, Self Earna Coder, MD as Consulting Physician (Internal Medicine)  Indicate any recent Medical Services you may have received from other than [facility] providers in the past year (date may be approximate).     Assessment:   This is a routine wellness examination for [place].  Hearing/Vision screen No results found.   Goals Addressed   None    Depression Screen    09/25/2017    9:07 AM 08/28/2016    9:16 AM 02/22/2016   10:17 AM 06/26/2015    9:23 AM 03/20/2015    8:42 AM  PHQ 2/9 Scores  PHQ - 2 Score 0 0 0 0 0  Exception Documentation    --     Fall Risk    03/23/2023    1:49 PM 06/15/2019   10:00 AM 09/25/2017    9:07 AM 06/17/2017   10:00 AM 06/04/2017   11:00 AM  Fall Risk   Falls in the past year? 1 0 No No No  Number falls in past yr: 1      Injury with Fall? 0      Risk for fall due to : History of fall(s);Impaired balance/gait;Orthopedic patient;Impaired mobility        MEDICARE RISK AT HOME: Medicare Risk at Home Any stairs in or around the home?: No If so, are there any without handrails?: No Home free of loose throw rugs in walkways, pet beds, electrical cords, etc?: Yes Adequate lighting in your home to reduce risk of falls?: Yes Life alert?: No Use of a cane, walker or w/c?: Yes Grab bars in the bathroom?: Yes Shower chair or bench in shower?: Yes Elevated toilet seat or a handicapped toilet?: Yes  TIMED UP AND GO:  Was the test performed?  No    Cognitive Function:        Immunizations Immunization History  Administered Date(s) Administered   Fluad Quad(high Dose 65+) 05/04/2019   Influenza Inj Mdck Quad Pf 05/21/2022   Influenza Split 03/28/2014   Influenza,inj,Quad PF,6+ Mos 04/27/2016   Influenza-Unspecified 05/01/2017, 05/20/2023   PFIZER Comirnaty(Gray Top)Covid-19 Tri-Sucrose Vaccine 08/04/2019, 08/25/2019,  12/07/2020   PFIZER(Purple Top)SARS-COV-2 Vaccination 08/04/2019, 08/25/2019, 04/05/2020, 12/07/2020, 04/24/2023   Pneumococcal Conjugate-13 11/27/2015   Pneumococcal Polysaccharide-23 06/02/2013   Tdap 06/02/2013   Zoster Recombinant(Shingrix) 05/17/2020    TDAP status: Up to date  Flu Vaccine status: Up to date  Pneumococcal vaccine status: Up to date  Covid-19 vaccine status: Information provided on how to obtain vaccines.   Qualifies for Shingles Vaccine? Yes   Zostavax completed No   Shingrix Completed?: No.    Education has been provided regarding the importance of this vaccine. Patient has been advised to call insurance company to determine out of pocket expense if they have not yet received this vaccine. Advised may also receive vaccine at local pharmacy or Health Dept. Verbalized acceptance and understanding.  Screening Tests Health Maintenance  Topic Date Due   Zoster Vaccines- Shingrix (2 of 2) 07/12/2020   DTaP/Tdap/Td (2 - Td or Tdap) 06/03/2023   COVID-19 Vaccine (9 - 2023-24 season) 06/19/2023   Pneumonia Vaccine 39+ Years old  Completed   INFLUENZA VACCINE  Completed   DEXA SCAN  Completed   HPV VACCINES  Aged Out    Health Maintenance  Health Maintenance Due  Topic Date Due   Zoster Vaccines- Shingrix (2 of 2) 07/12/2020    Colorectal cancer screening: No longer required.   Mammogram status: No longer required due to age.  Lung Cancer Screening: (Low Dose CT Chest recommended if Age 54-80 years, 20 pack-year currently smoking OR have quit w/in 15years.) does not qualify.   Lung Cancer Screening Referral: na  Additional Screening:  Hepatitis C Screening: does not qualify; Completed   Vision Screening: Recommended annual ophthalmology exams for early detection of glaucoma and other disorders of the eye. Is the patient up to date with their annual eye exam?  Yes  Who is the provider or what is the name of the office in which the patient attends  annual eye exams? Onsite eye  If pt is not established with a provider, would they like to be referred to a provider to establish care? No .   Dental Screening: Recommended annual dental exams for proper oral hygiene    Community Resource Referral / Chronic Care Management: CRR required this visit?  No   CCM required this visit?  No     Plan:     I have personally reviewed and noted the following in the patient's chart:   Medical and social history Use of alcohol, tobacco or illicit drugs  Current medications and supplements including opioid prescriptions. Patient is not currently taking opioid prescriptions. Functional ability and status Nutritional status Physical activity Advanced directives List of other physicians Hospitalizations, surgeries, and ER visits in previous 12 months Vitals Screenings to include cognitive, depression, and falls Referrals and appointments  In addition, I have reviewed and discussed with patient certain preventive protocols, quality metrics, and best practice recommendations. A written personalized care plan for preventive services as well as general preventive health recommendations were provided to patient.     Sharon Seller, NP   05/26/2023

## 2023-06-07 ENCOUNTER — Other Ambulatory Visit: Payer: Self-pay

## 2023-06-18 LAB — COMPREHENSIVE METABOLIC PANEL
Calcium: 8.6 — AB (ref 8.7–10.7)
eGFR: 65

## 2023-06-18 LAB — BASIC METABOLIC PANEL
BUN: 18 (ref 4–21)
CO2: 31 — AB (ref 13–22)
Chloride: 101 (ref 99–108)
Creatinine: 0.9 (ref 0.5–1.1)
Glucose: 131
Potassium: 4 meq/L (ref 3.5–5.1)
Sodium: 140 (ref 137–147)

## 2023-06-18 LAB — HEMOGLOBIN A1C: Hemoglobin A1C: 6.1

## 2023-06-22 ENCOUNTER — Non-Acute Institutional Stay (SKILLED_NURSING_FACILITY): Payer: Medicare Other | Admitting: Student

## 2023-06-22 DIAGNOSIS — M87052 Idiopathic aseptic necrosis of left femur: Secondary | ICD-10-CM

## 2023-06-22 DIAGNOSIS — R635 Abnormal weight gain: Secondary | ICD-10-CM

## 2023-06-22 DIAGNOSIS — F0280 Dementia in other diseases classified elsewhere without behavioral disturbance: Secondary | ICD-10-CM

## 2023-06-22 DIAGNOSIS — G301 Alzheimer's disease with late onset: Secondary | ICD-10-CM

## 2023-06-22 DIAGNOSIS — Z853 Personal history of malignant neoplasm of breast: Secondary | ICD-10-CM

## 2023-06-22 DIAGNOSIS — G40909 Epilepsy, unspecified, not intractable, without status epilepticus: Secondary | ICD-10-CM | POA: Diagnosis not present

## 2023-06-23 ENCOUNTER — Encounter: Payer: Self-pay | Admitting: Student

## 2023-06-23 NOTE — Progress Notes (Signed)
Location:      Place of Service:    Provider:  Judeth Horn, MD  Patient Care Team: Earnestine Mealing, MD as PCP - General (Family Medicine) Lemar Livings, Merrily Pew, MD (General Surgery) Requested, Self Earna Coder, MD as Consulting Physician (Internal Medicine)  Extended Emergency Contact Information Primary Emergency Contact: Kwanisha, Seufert Address: [address]          [place], [place] [address] [place] Home Phone: [phone] Mobile Phone: [phone] Relation: Spouse Secondary Emergency Contact: Ewell Poe [place] of [place] Home Phone: [phone] Mobile Phone: [phone] Relation: Son  Code Status:  DNR Goals of care: Advanced Directive information    05/26/2023   11:39 AM  Advanced Directives  Does Patient Have a Medical Advance Directive? Yes  Type of Estate agent of [place];Out of facility DNR (pink MOST or yellow form);Living will  Does patient want to make changes to medical advance directive? No - Patient declined  Copy of Healthcare Power of Attorney in Chart? Yes - validated most recent copy scanned in chart (See row information)     No chief complaint on file.   HPI:  Pt is a 82 y.o. female seen today for medical management of chronic diseases.   Patient smiles and when asked what book she is reading she closes the book and shows the front cover saying "this." I just like these stories. They are really good. Denies pain. States she is eating well.   Nursing and family with continued concern for patient's weight gain, however, her most recent BNP 10, kidney function stable.ultrasound of leg negative for DVT.   Past Medical History:  Diagnosis Date   Arthritis    Brain tumor (HCC) 1995   meningeoma   Breast cancer (HCC) 2018   left breast; surgery 01/11/15 with Dr. Lemar Livings; path with invasive mammary and DCIS   Breast cancer of upper-inner quadrant of left female  breast (HCC) 12/15/14   Completed radiation end of December and finished chemotherapy 2 weeks ago, Left breast invasive mammary carcinoma, T1cN37mic (1.5 cm); Grade 3, [name], [name], [name], .   Cataract    bilat    DDD (degenerative disc disease), lumbar    Lumbar, previously evaluated by Dr. 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   Numbness and tingling    right hand    Osteoarthritis    Osteoporosis, post-menopausal    Personal history of chemotherapy    Personal history of radiation therapy    Shoulder pain, right    Wears glasses    Past Surgical History:  Procedure Laterality Date   APPENDECTOMY  1950   BRAIN SURGERY  1995   left frontal/temporal   BREAST BIOPSY Left 12/15/14   confirmed DCIS   BREAST BIOPSY Left 01/08/2015   Procedure: BREAST BIOPSY WITH NEEDLE LOCALIZATION;  Surgeon: Earline Mayotte, MD;  Location: [facility] ORS;  Service: General;  Laterality: Left;   BREAST EXCISIONAL BIOPSY Left 1997   BREAST LUMPECTOMY Left 01/08/2015   Procedure: LUMPECTOMY;  Surgeon: Earline Mayotte, MD;  Location: [facility] ORS;  Service: General;  Laterality: Left;   COLONOSCOPY  2010   Dr. Markham Jordan   ORIF ANKLE FRACTURE Right 08/05/2017   Procedure: OPEN REDUCTION INTERNAL FIXATION (ORIF) ANKLE FRACTURE;  Surgeon: Deeann Saint, MD;  Location: [facility] ORS;  Service: Orthopedics;  Laterality: Right;   PORTACATH PLACEMENT Right 01/16/2015   Procedure: INSERTION PORT-A-CATH;  Surgeon: Earline Mayotte, MD;  Location: [facility] ORS;  Service:  General;  Laterality: Right;   SENTINEL NODE BIOPSY Left 01/16/2015   Procedure: SENTINEL NODE BIOPSY;  Surgeon: Earline Mayotte, MD;  Location: [facility] ORS;  Service: General;  Laterality: Left;   TOTAL HIP ARTHROPLASTY Right 09/04/2015   Procedure: RIGHT TOTAL HIP ARTHROPLASTY ANTERIOR APPROACH;  Surgeon: Durene Romans, MD;  Location: [facility];  Service: Orthopedics;  Laterality: Right;    Allergies  Allergen Reactions   Donepezil Other (See Comments)    Other reaction(s): Other (See Comments) Nightmares    Outpatient Encounter Medications as of 06/22/2023  Medication Sig   acetaminophen (TYLENOL) 325 MG tablet Take 650 mg by mouth every 8 (eight) hours as needed. Give Two tablets by mouth twice daily   ARTIFICIAL TEARS 1 % ophthalmic solution Place 1 drop into both eyes daily.   levETIRAcetam (KEPPRA) 250 MG tablet 1 tablet daily at bedtime   polyethylene glycol (MIRALAX / GLYCOLAX) 17 g packet Take 17 g by mouth daily as needed. Give 1 scoop by mouth once daily every other day.   Travoprost, BAK Free, (TRAVATAN) 0.004 % SOLN ophthalmic solution Place 1 drop into both eyes at bedtime.   Zinc Oxide (TRIPLE PASTE) 12.8 % ointment Apply 1 Application topically. Every shift.   No facility-administered encounter medications on file as of 06/22/2023.    Review of Systems  Immunization History  Administered Date(s) Administered   Fluad Quad(high Dose 65+) 05/04/2019   Influenza Inj Mdck Quad Pf 05/21/2022   Influenza Split 03/28/2014   Influenza,inj,Quad PF,6+ Mos 04/27/2016   Influenza-Unspecified 05/01/2017, 05/20/2023   PFIZER Comirnaty(Gray Top)Covid-19 Tri-Sucrose Vaccine 08/04/2019, 08/25/2019, 12/07/2020   PFIZER(Purple Top)SARS-COV-2 Vaccination 08/04/2019, 08/25/2019, 04/05/2020, 12/07/2020, 04/24/2023   Pneumococcal Conjugate-13 11/27/2015   Pneumococcal Polysaccharide-23 06/02/2013   Tdap 06/02/2013   Zoster Recombinant(Shingrix) 05/17/2020   Pertinent  Health Maintenance  Due  Topic Date Due   INFLUENZA VACCINE  Completed   DEXA SCAN  Completed      08/12/2021    8:58 AM 09/24/2021    2:25 PM 11/05/2021    3:19 PM 01/06/2022    2:00 PM 03/23/2023    1:49 PM  Fall Risk  Falls in the past year?     1  Was there an injury with Fall?     0  Fall Risk Category Calculator     2  (RETIRED) Patient Fall Risk Level High fall risk High fall risk High fall risk High fall risk   Patient at Risk for Falls Due to     History of fall(s);Impaired balance/gait;Orthopedic patient;Impaired mobility   Functional Status Survey:    There were no vitals filed for this visit. There is no height or weight on file to calculate BMI. Physical Exam Constitutional:      Appearance: Normal appearance. She is obese.  Cardiovascular:     Rate and Rhythm: Normal rate and regular rhythm.  Pulses: Normal pulses.  Pulmonary:     Effort: Pulmonary effort is normal.  Abdominal:     General: Abdomen is flat.     Palpations: Abdomen is soft.  Skin:    General: Skin is warm and dry.  Neurological:     Mental Status: She is alert. Mental status is at baseline. She is disoriented.     Labs reviewed: Recent Labs    05/14/23 0000  NA 139  K 4.3  CL 99  CO2 31*  BUN 17  CREATININE 0.8  CALCIUM 9.1   Recent Labs    05/14/23 0000  AST 17  ALT 21  ALKPHOS 70  ALBUMIN 3.9   Recent Labs    05/14/23 0000  WBC 7.2  HGB 14.2  HCT 45  PLT 293   No results found for: "TSH" Lab Results  Component Value Date   HGBA1C 5.6 11/16/2020   No results found for: "CHOL", "HDL", "LDLCALC", "LDLDIRECT", "TRIG", "CHOLHDL"  Significant Diagnostic Results in last 30 days:  No results found.  Assessment/Plan Avascular necrosis of femoral head, left (HCC)  Late onset Alzheimer's disease without behavioral disturbance (HCC)  Nonintractable epilepsy without status epilepticus, unspecified epilepsy type (HCC)  Weight gain  History of breast cancer Patient with hx of  avascular necrosis and patient has had increased pain to the hip. Therapy uncomfortable performing weight bearing given this history and pain, will order x-ray repeat. Of note x-ray on 10/2 negative for acute change. Patient has continued chronic decline of memory. She had minimal conversation and continued functional decline. Patient has a FAST stage 6d at this time. Continue GOC conversations. No recent seizure-like activity. Continue keppra. Weight gain likely due to increased access to food and lack of inhibitions, continue to monitor. A1c 5.4 on 06/15/23. No changes in medications at this time.    Family/ staff Communication: nursing  Labs/tests ordered:  none

## 2023-06-24 ENCOUNTER — Other Ambulatory Visit: Payer: Self-pay

## 2023-08-11 ENCOUNTER — Encounter: Payer: Self-pay | Admitting: Nurse Practitioner

## 2023-08-11 ENCOUNTER — Non-Acute Institutional Stay (SKILLED_NURSING_FACILITY): Payer: Self-pay | Admitting: Nurse Practitioner

## 2023-08-11 DIAGNOSIS — G301 Alzheimer's disease with late onset: Secondary | ICD-10-CM | POA: Diagnosis not present

## 2023-08-11 DIAGNOSIS — M158 Other polyosteoarthritis: Secondary | ICD-10-CM

## 2023-08-11 DIAGNOSIS — R635 Abnormal weight gain: Secondary | ICD-10-CM

## 2023-08-11 DIAGNOSIS — E785 Hyperlipidemia, unspecified: Secondary | ICD-10-CM

## 2023-08-11 DIAGNOSIS — F0280 Dementia in other diseases classified elsewhere without behavioral disturbance: Secondary | ICD-10-CM

## 2023-08-11 DIAGNOSIS — K5904 Chronic idiopathic constipation: Secondary | ICD-10-CM

## 2023-08-11 DIAGNOSIS — G40909 Epilepsy, unspecified, not intractable, without status epilepticus: Secondary | ICD-10-CM

## 2023-08-11 NOTE — Progress Notes (Signed)
 Location:  Other [facility].  Nursing Home Room Number: [facility] 503A Place of Service:  SNF (31) Harlene An, NP  PCP: Abdul Fine, MD  Patient Care Team: Abdul Fine, MD as PCP - General (Family Medicine) Dessa, Reyes ORN, MD (General Surgery) Requested, Self Rennie Cindy SAUNDERS, MD as Consulting Physician (Internal Medicine)  Extended Emergency Contact Information Primary Emergency Contact: Hershal Ozell ORN Address: [address]          [place], [place] [address] [place] Home Phone: [phone] Mobile Phone: [phone] Relation: Spouse Secondary Emergency Contact: Hershal PONCE Ozell  [place] Home Phone: [phone] Mobile Phone: [phone] Relation: Son  Goals of care: Advanced Directive information    08/11/2023    9:35 AM  Advanced Directives  Does Patient Have a Medical Advance Directive? Yes  Type of Estate Agent of [place];Out of facility DNR (pink MOST or yellow form);Living will  Does patient want to make changes to medical advance directive? No - Patient declined  Copy of Healthcare Power of Attorney in Chart? Yes - validated most recent copy scanned in chart (See row information)    Chief Complaint  Patient presents with   Medical Management of Chronic Issues    Medical Management of Chronic Issues.    HPI:  Pt is a 83 y.o. female seen today for medical management of chronic disease. She is currently resting and reading a book in the common area with her legs elevated and in not acute distress.  Per nursing staff no new concerns with patient other than redness and pain to her toes on bilateral feet for which she is applying clotrimazole 1% cream x 1 month- now currently on week 2. It was ineffective before but now per MD use for longer duration. Pt stated to nursing staff that her toes feel better.   Patient's weight is currently 197 lbs. Dietary modifications were made  per family's request due to gradual weight gain. Patient's vegetable intake increased and carbs decreased. Pt is still asking for sweets regularly.  She denies any pain, bowel and bladder issues, changes in appetite and shortness of breath.     Past Medical History:  Diagnosis Date   Arthritis    Brain tumor (HCC) 1995   meningeoma   Breast cancer (HCC) 2018   left breast; surgery 01/11/15 with Dr. 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   Cataract    bilat    DDD (degenerative disc disease), lumbar    Lumbar, previously evaluated by Dr. 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, 1995.   Numbness and tingling    right hand    Osteoarthritis    Osteoporosis,  post-menopausal    Personal history of chemotherapy    Personal history of radiation therapy    Shoulder pain, right    Wears glasses    Past Surgical History:  Procedure Laterality Date   APPENDECTOMY  1950   BRAIN SURGERY  1995   left frontal/temporal   BREAST BIOPSY Left 12/15/14   confirmed DCIS   BREAST BIOPSY Left 01/08/2015   Procedure: BREAST BIOPSY WITH NEEDLE LOCALIZATION;  Surgeon: Reyes LELON Cota, MD;  Location: [facility] ORS;  Service: General;  Laterality: Left;   BREAST EXCISIONAL BIOPSY Left 1997   BREAST  LUMPECTOMY Left 01/08/2015   Procedure: LUMPECTOMY;  Surgeon: Reyes LELON Cota, MD;  Location: [facility] ORS;  Service: General;  Laterality: Left;   COLONOSCOPY  2010   Dr. Geryl   ORIF ANKLE FRACTURE Right 08/05/2017   Procedure: OPEN REDUCTION INTERNAL FIXATION (ORIF) ANKLE FRACTURE;  Surgeon: Cleotilde Barrio, MD;  Location: [facility] ORS;  Service: Orthopedics;  Laterality: Right;   PORTACATH PLACEMENT Right 01/16/2015   Procedure: INSERTION PORT-A-CATH;  Surgeon: Reyes LELON Cota, MD;  Location: [facility] ORS;  Service: General;  Laterality: Right;   SENTINEL NODE BIOPSY Left 01/16/2015   Procedure: SENTINEL NODE BIOPSY;  Surgeon: Reyes LELON Cota, MD;  Location: [facility] ORS;  Service: General;  Laterality: Left;   TOTAL HIP ARTHROPLASTY Right 09/04/2015   Procedure: RIGHT TOTAL HIP ARTHROPLASTY ANTERIOR APPROACH;  Surgeon: Donnice Car, MD;  Location: [facility] ORS;  Service: Orthopedics;  Laterality: Right;    Allergies  Allergen Reactions   Donepezil Other (See Comments)    Other reaction(s): Other (See Comments) Nightmares    Outpatient Encounter Medications as of 08/11/2023  Medication Sig   acetaminophen  (TYLENOL ) 325 MG tablet Take 650 mg by mouth every 8 (eight) hours as needed. Give Two tablets by mouth twice daily   ARTIFICIAL TEARS 1 % ophthalmic solution Place 1 drop into both eyes daily.   clotrimazole (LOTRIMIN) 1 % cream Apply to bilateral feet topically in the morning   levETIRAcetam  (KEPPRA ) 250 MG tablet 1 tablet daily at bedtime   polyethylene glycol (MIRALAX  / GLYCOLAX ) 17 g packet Take 17 g by mouth daily as needed. Give 1 scoop by mouth once daily every other day.   Travoprost, BAK Free, (TRAVATAN) 0.004 % SOLN ophthalmic solution Place 1 drop into both eyes at bedtime.   Zinc Oxide (TRIPLE PASTE) 12.8 % ointment Apply 1 Application topically. Every shift.   [DISCONTINUED] mometasone (ELOCON) 0.1 % cream Apply topically.   No facility-administered encounter medications on file as of  08/11/2023.   Review of Systems  Unable to perform ROS: Dementia     Immunization History  Administered Date(s) Administered   Fluad Quad(high Dose 65+) 05/04/2019   Influenza Inj Mdck Quad Pf 05/21/2022   Influenza Split 03/28/2014   Influenza,inj,Quad PF,6+ Mos 04/27/2016   Influenza-Unspecified 05/01/2017, 05/20/2023   PFIZER Comirnaty(Gray Top)Covid-19 Tri-Sucrose Vaccine 08/04/2019, 08/25/2019, 12/07/2020   PFIZER(Purple Top)SARS-COV-2 Vaccination 08/04/2019, 08/25/2019, 04/05/2020, 12/07/2020, 04/24/2023   Pneumococcal Conjugate-13 11/27/2015   Pneumococcal Polysaccharide-23 06/02/2013   Tdap 06/02/2013   Zoster Recombinant(Shingrix) 05/17/2020, 05/29/2023   Pertinent  Health Maintenance Due  Topic Date Due   INFLUENZA VACCINE  Completed   DEXA SCAN  Completed      08/12/2021    8:58 AM 09/24/2021    2:25 PM 11/05/2021    3:19 PM 01/06/2022    2:00 PM 03/23/2023    1:49 PM  Fall Risk  Falls in the past year?  1  Was there an injury with Fall?     0  Fall Risk Category Calculator     2  (RETIRED) Patient Fall Risk Level High fall risk High fall risk High fall risk High fall risk   Patient at Risk for Falls Due to     History of fall(s);Impaired balance/gait;Orthopedic patient;Impaired mobility   Functional Status Survey:    Vitals:   08/11/23 0927  BP: 124/82  Pulse: 84  Resp: 20  Temp: (!) 97.3 F (36.3 C)  SpO2: 93%  Weight: 197 lb 6.4 oz (89.5 kg)  Height: 5' 4 (1.626 m)   Body mass index is 33.88 kg/m. Physical Exam Constitutional:      Appearance: Normal appearance. She is obese.  HENT:     Head: Normocephalic and atraumatic.     Right Ear: External ear normal.     Left Ear: External ear normal.     Nose: Nose normal.     Mouth/Throat:     Mouth: Mucous membranes are moist.     Pharynx: Oropharynx is clear.  Eyes:     Conjunctiva/sclera: Conjunctivae normal.  Cardiovascular:     Rate and Rhythm: Normal rate.     Pulses: Normal pulses.      Heart sounds: Normal heart sounds.  Pulmonary:     Effort: Pulmonary effort is normal.     Breath sounds: No wheezing.  Abdominal:     General: Bowel sounds are normal. There is distension.     Tenderness: There is no abdominal tenderness.  Musculoskeletal:     Cervical back: Normal range of motion and neck supple.     Right lower leg: Edema present.     Left lower leg: Edema present.  Skin:    General: Skin is dry.     Capillary Refill: Capillary refill takes less than 2 seconds.  Neurological:     Mental Status: She is alert. Mental status is at baseline.  Psychiatric:        Mood and Affect: Mood normal.    Labs reviewed: Recent Labs    05/14/23 0000 06/18/23 0000  NA 139 140  K 4.3 4.0  CL 99 101  CO2 31* 31*  BUN 17 18  CREATININE 0.8 0.9  CALCIUM  9.1 8.6*   Recent Labs    05/14/23 0000  AST 17  ALT 21  ALKPHOS 70  ALBUMIN 3.9   Recent Labs    05/14/23 0000  WBC 7.2  HGB 14.2  HCT 45  PLT 293   No results found for: TSH Lab Results  Component Value Date   HGBA1C 6.1 06/18/2023   No results found for: CHOL, HDL, LDLCALC, LDLDIRECT, TRIG, CHOLHDL  Significant Diagnostic Results in last 30 days:  No results found.  Assessment/Plan  1. Other osteoarthritis involving multiple joints (Primary) -Stable, not complaining of pain at this time- continue tylenol  as needed for pain relief.   2. Late onset Alzheimer's disease without behavioral disturbance (HCC) -Stable, no acute behavioral disturbances per nursing staff. Pt has pleasant demeanor upon examination today. Continue supportive care.  3. Hyperlipidemia, unspecified hyperlipidemia type -Follow up with lipid panel, continue dietary modifications.   4. Nonintractable epilepsy without status epilepticus, unspecified epilepsy type (HCC) -Stable, no acute seizure activities noted. Continue Keppra  250mg  tablet at bedtime.  5. Chronic idiopathic constipation -mild abdomen distended,  non tender to palpation. Per nursing staff this is not new for patient. Her bowel movements are regular, about once a day. Continue  Miralax  MWF- consider increasing to daily.  6. Weight gain -Current weight 197lb. Continue low carb high fiber diet and limit fatty snacks.  Waylan Rabon, RN DNP - AGPCNP Student I personally was present during the history, physical exam and medical decision-making activities of this service and have verified that the service and findings are accurately documented in the student's note Lakhia Gengler K. Caro BODILY [facility] & Adult Medicine [phone]

## 2023-08-11 NOTE — Progress Notes (Deleted)
 Location:  Other [facility].  Nursing Home Room Number: [facility] 503A Place of Service:  SNF (31) Harlene An, NP  PCP: Abdul Fine, MD  Patient Care Team: Abdul Fine, MD as PCP - General (Family Medicine) Dessa, Reyes ORN, MD (General Surgery) Requested, Self Rennie Cindy SAUNDERS, MD as Consulting Physician (Internal Medicine)  Extended Emergency Contact Information Primary Emergency Contact: Hershal Ozell ORN Address: [address]          [place], [place] [address] [place] Home Phone: [phone] Mobile Phone: [phone] Relation: Spouse Secondary Emergency Contact: Hershal PONCE Ozell  [place] Home Phone: [phone] Mobile Phone: [phone] Relation: Son  Goals of care: Advanced Directive information    08/11/2023    9:35 AM  Advanced Directives  Does Patient Have a Medical Advance Directive? Yes  Type of Estate Agent of [place];Out of facility DNR (pink MOST or yellow form);Living will  Does patient want to make changes to medical advance directive? No - Patient declined  Copy of Healthcare Power of Attorney in Chart? Yes - validated most recent copy scanned in chart (See row information)     Chief Complaint  Patient presents with   Medical Management of Chronic Issues    Medical Management of Chronic Issues.     HPI:  Pt is a 83 y.o. female seen today for medical management of chronic disease.    Past Medical History:  Diagnosis Date   Arthritis    Brain tumor (HCC) 1995   meningeoma   Breast cancer (HCC) 2018   left breast; surgery 01/11/15 with Dr. Dessa; path with invasive mammary and DCIS   Breast cancer of upper-inner quadrant of left female breast (HCC) 12/15/14   Completed radiation end of December and finished chemotherapy 2 weeks ago, Left breast invasive mammary carcinoma, T1cN24mic (1.5 cm); Grade 3, [name], [name], HER-2/neu  3+, .   Cataract    bilat    DDD (degenerative disc disease), lumbar    Lumbar, previously evaluated by Dr. 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   Numbness and tingling    right hand    Osteoarthritis    Osteoporosis, post-menopausal    Personal history of chemotherapy    Personal history of radiation therapy    Shoulder pain, right    Wears glasses    Past Surgical History:  Procedure Laterality Date   APPENDECTOMY  1950   BRAIN SURGERY  1995   left frontal/temporal   BREAST BIOPSY Left 12/15/14   confirmed DCIS   BREAST BIOPSY Left 01/08/2015   Procedure: BREAST BIOPSY WITH NEEDLE LOCALIZATION;  Surgeon: Reyes ORN Dessa, MD;  Location: [facility] ORS;  Service: General;  Laterality: Left;   BREAST EXCISIONAL BIOPSY Left 1997   BREAST LUMPECTOMY Left 01/08/2015   Procedure: LUMPECTOMY;  Surgeon: Reyes ORN Dessa, MD;  Location: [facility] ORS;  Service: General;  Laterality: Left;   COLONOSCOPY  2010   Dr. Geryl   ORIF ANKLE FRACTURE Right 08/05/2017   Procedure: OPEN REDUCTION INTERNAL FIXATION (ORIF) ANKLE FRACTURE;  Surgeon: Cleotilde Barrio, MD;  Location: [facility] ORS;  Service: Orthopedics;  Laterality: Right;   PORTACATH PLACEMENT Right 01/16/2015   Procedure: INSERTION PORT-A-CATH;  Surgeon: Reyes LELON Cota, MD;  Location: [facility] ORS;  Service: General;  Laterality: Right;   SENTINEL NODE BIOPSY Left 01/16/2015   Procedure: SENTINEL NODE BIOPSY;  Surgeon: Reyes LELON Cota, MD;  Location: [facility] ORS;  Service: General;  Laterality: Left;   TOTAL HIP ARTHROPLASTY  Right 09/04/2015   Procedure: RIGHT TOTAL HIP ARTHROPLASTY ANTERIOR APPROACH;  Surgeon: Donnice Car, MD;  Location: [facility];  Service: Orthopedics;  Laterality: Right;    Allergies  Allergen Reactions   Donepezil Other (See Comments)    Other reaction(s): Other (See Comments) Nightmares    Outpatient Encounter Medications as of 08/11/2023  Medication Sig   acetaminophen  (TYLENOL ) 325 MG tablet Take 650 mg by mouth every 8 (eight) hours as needed. Give Two tablets by mouth twice daily   ARTIFICIAL TEARS 1 % ophthalmic solution Place 1 drop into both eyes daily.   clotrimazole (LOTRIMIN) 1 % cream Apply to bilateral feet topically in the morning   levETIRAcetam  (KEPPRA ) 250 MG tablet 1 tablet daily at bedtime   polyethylene glycol (MIRALAX  / GLYCOLAX ) 17 g packet Take 17 g by mouth daily as needed. Give 1 scoop by mouth once daily every other day.   Travoprost, BAK Free, (TRAVATAN) 0.004 % SOLN ophthalmic solution Place 1 drop into both eyes at bedtime.   Zinc Oxide (TRIPLE PASTE) 12.8 % ointment Apply 1 Application topically. Every shift.   [DISCONTINUED] mometasone (ELOCON) 0.1 % cream Apply topically.   No facility-administered encounter medications on file as of 08/11/2023.    Review of Systems ***  Immunization History  Administered Date(s) Administered   Fluad Quad(high Dose 65+) 05/04/2019   Influenza Inj Mdck Quad Pf 05/21/2022   Influenza Split 03/28/2014   Influenza,inj,Quad PF,6+ Mos 04/27/2016   Influenza-Unspecified 05/01/2017, 05/20/2023   PFIZER Comirnaty(Gray Top)Covid-19 Tri-Sucrose Vaccine 08/04/2019, 08/25/2019, 12/07/2020   PFIZER(Purple Top)SARS-COV-2 Vaccination 08/04/2019, 08/25/2019, 04/05/2020, 12/07/2020, 04/24/2023   Pneumococcal Conjugate-13 11/27/2015   Pneumococcal Polysaccharide-23 06/02/2013   Tdap 06/02/2013   Zoster Recombinant(Shingrix) 05/17/2020, 05/29/2023   Pertinent  Health Maintenance Due  Topic Date Due   INFLUENZA VACCINE  Completed    DEXA SCAN  Completed      08/12/2021    8:58 AM 09/24/2021    2:25 PM 11/05/2021    3:19 PM 01/06/2022    2:00 PM 03/23/2023    1:49 PM  Fall Risk  Falls in the past year?     1  Was there an injury with Fall?     0  Fall Risk Category Calculator     2  (RETIRED) Patient Fall Risk Level High fall risk High fall risk High fall risk High fall risk   Patient at Risk for Falls Due to     History of fall(s);Impaired balance/gait;Orthopedic patient;Impaired mobility   Functional Status Survey:    Vitals:   08/11/23 0927  BP: 124/82  Pulse: 84  Resp: 20  Temp: (!) 97.3 F (36.3 C)  SpO2: 93%  Weight: 197 lb 6.4 oz (89.5 kg)  Height: 5' 4 (1.626 m)   Body mass index is 33.88 kg/m. Physical Exam***  Labs reviewed: Recent Labs    05/14/23 0000 06/18/23 0000  NA 139 140  K  4.3 4.0  CL 99 101  CO2 31* 31*  BUN 17 18  CREATININE 0.8 0.9  CALCIUM  9.1 8.6*   Recent Labs    05/14/23 0000  AST 17  ALT 21  ALKPHOS 70  ALBUMIN 3.9   Recent Labs    05/14/23 0000  WBC 7.2  HGB 14.2  HCT 45  PLT 293   No results found for: TSH Lab Results  Component Value Date   HGBA1C 6.1 06/18/2023   No results found for: CHOL, HDL, LDLCALC, LDLDIRECT, TRIG, CHOLHDL  Significant Diagnostic Results in last 30 days:  No results found.  Assessment/Plan There are no diagnoses linked to this encounter.   Keandra Medero K. Caro BODILY [facility] & Adult Medicine [phone]

## 2023-08-13 LAB — LIPID PANEL
Cholesterol: 239 — AB (ref 0–200)
HDL: 38 (ref 35–70)
LDL Cholesterol: 162
Triglycerides: 233 — AB (ref 40–160)

## 2023-08-20 ENCOUNTER — Other Ambulatory Visit: Payer: Self-pay

## 2023-10-07 ENCOUNTER — Non-Acute Institutional Stay (SKILLED_NURSING_FACILITY): Payer: Self-pay | Admitting: Student

## 2023-10-07 ENCOUNTER — Encounter: Payer: Self-pay | Admitting: Student

## 2023-10-07 DIAGNOSIS — G301 Alzheimer's disease with late onset: Secondary | ICD-10-CM

## 2023-10-07 DIAGNOSIS — F0280 Dementia in other diseases classified elsewhere without behavioral disturbance: Secondary | ICD-10-CM

## 2023-10-07 DIAGNOSIS — G40909 Epilepsy, unspecified, not intractable, without status epilepticus: Secondary | ICD-10-CM

## 2023-10-07 DIAGNOSIS — E785 Hyperlipidemia, unspecified: Secondary | ICD-10-CM | POA: Diagnosis not present

## 2023-10-07 DIAGNOSIS — C50812 Malignant neoplasm of overlapping sites of left female breast: Secondary | ICD-10-CM

## 2023-10-07 DIAGNOSIS — R635 Abnormal weight gain: Secondary | ICD-10-CM | POA: Diagnosis not present

## 2023-10-07 DIAGNOSIS — Z171 Estrogen receptor negative status [ER-]: Secondary | ICD-10-CM

## 2023-10-07 NOTE — Progress Notes (Signed)
 Location:  Other [facility].  Nursing Home Room Number: [facility] [address] Place of Service:  SNF [phone]) Provider:  Earnestine Mealing, MD  Patient Care Team: Earnestine Mealing, MD as PCP - General (Family Medicine) Lemar Livings, Merrily Pew, MD (General Surgery) Requested, Self Earna Coder, MD as Consulting Physician (Internal Medicine)  Extended Emergency Contact Information Primary Emergency Contact: Ramey, [place] Address: [address]          [place], [place] [address] [place] of [place] Home Phone: [phone] Mobile Phone: [phone] Relation: Spouse Secondary Emergency Contact: Ewell Poe [place] Home Phone: [phone] Mobile Phone: [phone] Relation: Son  Code Status:  DNR Goals of care: Advanced Directive information    08/11/2023    9:35 AM  Advanced Directives  Does Patient Have a Medical Advance Directive? Yes  Type of Estate agent of [place];Out of facility DNR (pink MOST or yellow form);Living will  Does patient want to make changes to medical advance directive? No - Patient declined  Copy of Healthcare Power of Attorney in Chart? Yes - validated most recent copy scanned in chart (See row information)     Chief Complaint  Patient presents with   Medical Management of Chronic Issues    Medical Management of Chronic Issues.     HPI:  Pt is a 83 y.o. female seen today for medical management of chronic diseases.   Patient is smiling and eating dinner. Continued weight gain per nursing and chart review. Patient is often reading and participating in activities on the hall.    Past Medical History:  Diagnosis Date   Arthritis    Brain tumor (HCC) 1995   meningeoma   Breast cancer (HCC) 2018   left breast; surgery 01/11/15 with Dr. 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   Cataract    bilat    DDD (degenerative disc disease), lumbar    Lumbar, previously evaluated by Dr. 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   Numbness and tingling    right hand    Osteoarthritis    Osteoporosis, post-menopausal    Personal history of chemotherapy    Personal history of radiation therapy    Shoulder pain, right    Wears glasses    Past Surgical History:  Procedure Laterality Date   APPENDECTOMY  1950   BRAIN SURGERY  1995   left frontal/temporal   BREAST BIOPSY Left 12/15/14   confirmed DCIS   BREAST BIOPSY Left 01/08/2015   Procedure: BREAST BIOPSY WITH NEEDLE LOCALIZATION;  Surgeon: Earline Mayotte, MD;  Location: [facility] ORS;  Service: General;  Laterality: Left;   BREAST EXCISIONAL BIOPSY Left 1997  BREAST LUMPECTOMY Left 01/08/2015   Procedure: LUMPECTOMY;  Surgeon: Earline Mayotte, MD;  Location: [facility] ORS;  Service: General;  Laterality: Left;   COLONOSCOPY  2010   Dr. Markham Jordan   ORIF ANKLE FRACTURE Right 08/05/2017   Procedure: OPEN REDUCTION INTERNAL FIXATION (ORIF) ANKLE FRACTURE;  Surgeon: Deeann Saint, MD;  Location: [facility] ORS;  Service: Orthopedics;  Laterality: Right;   PORTACATH PLACEMENT Right 01/16/2015   Procedure: INSERTION PORT-A-CATH;  Surgeon: Earline Mayotte, MD;  Location: [facility] ORS;  Service: General;  Laterality: Right;   SENTINEL NODE BIOPSY Left  01/16/2015   Procedure: SENTINEL NODE BIOPSY;  Surgeon: Earline Mayotte, MD;  Location: [facility] ORS;  Service: General;  Laterality: Left;   TOTAL HIP ARTHROPLASTY Right 09/04/2015   Procedure: RIGHT TOTAL HIP ARTHROPLASTY ANTERIOR APPROACH;  Surgeon: Durene Romans, MD;  Location: [facility] ORS;  Service: Orthopedics;  Laterality: Right;    Allergies  Allergen Reactions   Donepezil Other (See Comments)    Other reaction(s): Other (See Comments) Nightmares    Outpatient Encounter Medications as of 10/07/2023  Medication Sig   acetaminophen (TYLENOL) 325 MG tablet Take 650 mg by mouth every 8 (eight) hours as needed. Give Two tablets by mouth twice daily   ARTIFICIAL TEARS 1 % ophthalmic solution Place 1 drop into both eyes 2 (two) times daily.   levETIRAcetam (KEPPRA) 250 MG tablet 1 tablet daily at bedtime   polyethylene glycol (MIRALAX / GLYCOLAX) 17 g packet Take 17 g by mouth daily as needed. One scoop by mouth once daily Monday, Wednesday and Friday.   Travoprost, BAK Free, (TRAVATAN) 0.004 % SOLN ophthalmic solution Place 1 drop into both eyes at bedtime.   Zinc Oxide (TRIPLE PASTE) 12.8 % ointment Apply 1 Application topically. Every shift.   clotrimazole (LOTRIMIN) 1 % cream Apply to bilateral feet topically in the morning (Patient not taking: Reported on 10/07/2023)   No facility-administered encounter medications on file as of 10/07/2023.    Review of Systems  Immunization History  Administered Date(s) Administered   Fluad Quad(high Dose 65+) 05/04/2019   Influenza Inj Mdck Quad Pf 05/21/2022   Influenza Split 03/28/2014   Influenza,inj,Quad PF,6+ Mos 04/27/2016   Influenza-Unspecified 05/01/2017, 05/20/2023   PFIZER Comirnaty(Gray Top)Covid-19 Tri-Sucrose Vaccine 08/04/2019, 08/25/2019, 12/07/2020   PFIZER(Purple Top)SARS-COV-2 Vaccination 08/04/2019, 08/25/2019, 04/05/2020, 12/07/2020, 04/24/2023   Pneumococcal Conjugate-13 11/27/2015   Pneumococcal Polysaccharide-23 06/02/2013    Tdap 06/02/2013   Zoster Recombinant(Shingrix) 05/17/2020, 05/29/2023   Pertinent  Health Maintenance Due  Topic Date Due   INFLUENZA VACCINE  Completed   DEXA SCAN  Completed      08/12/2021    8:58 AM 09/24/2021    2:25 PM 11/05/2021    3:19 PM 01/06/2022    2:00 PM 03/23/2023    1:49 PM  Fall Risk  Falls in the past year?     1  Was there an injury with Fall?     0  Fall Risk Category Calculator     2  (RETIRED) Patient Fall Risk Level High fall risk High fall risk High fall risk High fall risk   Patient at Risk for Falls Due to     History of fall(s);Impaired balance/gait;Orthopedic patient;Impaired mobility   Functional Status Survey:    Vitals:   10/07/23 0948  BP: 132/62  Pulse: 86  Resp: 20  Temp: 98.5 F (36.9 C)  SpO2: 98%  Weight: 196 lb 6.4 oz (89.1 kg)  Height: 5\' 4"  (1.626 m)   Body  mass index is 33.71 kg/m. Physical Exam Cardiovascular:     Rate and Rhythm: Normal rate and regular rhythm.     Pulses: Normal pulses.     Heart sounds: Normal heart sounds.  Pulmonary:     Effort: Pulmonary effort is normal.  Abdominal:     General: Abdomen is flat. Bowel sounds are normal.     Palpations: Abdomen is soft.  Musculoskeletal:        General: No swelling or tenderness.  Skin:    General: Skin is warm and dry.  Neurological:     Mental Status: She is alert. Mental status is at baseline. She is disoriented.     Labs reviewed: Recent Labs    05/14/23 0000 06/18/23 0000  NA 139 140  K 4.3 4.0  CL 99 101  CO2 31* 31*  BUN 17 18  CREATININE 0.8 0.9  CALCIUM 9.1 8.6*   Recent Labs    05/14/23 0000  AST 17  ALT 21  ALKPHOS 70  ALBUMIN 3.9   Recent Labs    05/14/23 0000  WBC 7.2  HGB 14.2  HCT 45  PLT 293   No results found for: "TSH" Lab Results  Component Value Date   HGBA1C 6.1 06/18/2023   Lab Results  Component Value Date   CHOL 239 (A) 08/13/2023   HDL 38 08/13/2023   LDLCALC 162 08/13/2023   TRIG 233 (A) 08/13/2023     Significant Diagnostic Results in last 30 days:  No results found.  Assessment/Plan Late onset Alzheimer's disease without behavioral disturbance (HCC)  Carcinoma of overlapping sites of left breast in female, estrogen receptor negative (HCC), Chronic  Weight gain  Hyperlipidemia, unspecified hyperlipidemia type  Nonintractable epilepsy without status epilepticus, unspecified epilepsy type [facility]) Patient with history of dementia without signs of acute decline at this time. No medications for mood or dementia at this time. She is at FAST stage 7c as she is unable to walk and has severely limited conversations. She is pleasant and participates in activities when able. Hx of breast cancer, no current treatment. Given continued weight gain, will recheck A1c, lipid panel, CMP and cbc. Patient is eating well. No clear weight gain associated with medications. Likely due to good appetite and poor mobility. Continue cholesterol medications. No recent seizure-like activity. Continue keppra as previously prescribed.   Family/ staff Communication: nursing  Labs/tests ordered:  outlined above

## 2023-10-22 LAB — BASIC METABOLIC PANEL WITH GFR
BUN: 14 (ref 4–21)
CO2: 30 — AB (ref 13–22)
Chloride: 102 (ref 99–108)
Creatinine: 0.6 (ref 0.5–1.1)
Glucose: 95
Potassium: 4.3 meq/L (ref 3.5–5.1)
Sodium: 139 (ref 137–147)

## 2023-10-22 LAB — CBC AND DIFFERENTIAL
HCT: 39 (ref 36–46)
Hemoglobin: 12.6 (ref 12.0–16.0)
Platelets: 275 10*3/uL (ref 150–400)
WBC: 6.8

## 2023-10-22 LAB — COMPREHENSIVE METABOLIC PANEL WITH GFR
Albumin: 3.6 (ref 3.5–5.0)
Calcium: 8.4 — AB (ref 8.7–10.7)
Globulin: 2.3
eGFR: 89

## 2023-10-22 LAB — HEPATIC FUNCTION PANEL
ALT: 25 U/L (ref 7–35)
AST: 16 (ref 13–35)
Alkaline Phosphatase: 57 (ref 25–125)
Bilirubin, Total: 0.3

## 2023-10-22 LAB — HEMOGLOBIN A1C: Hemoglobin A1C: 6.3

## 2023-10-22 LAB — CBC: RBC: 4.31 (ref 3.87–5.11)

## 2023-11-03 ENCOUNTER — Encounter: Payer: Self-pay | Admitting: Student

## 2023-11-04 ENCOUNTER — Other Ambulatory Visit: Payer: Self-pay

## 2023-12-08 ENCOUNTER — Encounter: Payer: Self-pay | Admitting: Nurse Practitioner

## 2023-12-08 ENCOUNTER — Non-Acute Institutional Stay (SKILLED_NURSING_FACILITY): Payer: Self-pay | Admitting: Nurse Practitioner

## 2023-12-08 DIAGNOSIS — K5904 Chronic idiopathic constipation: Secondary | ICD-10-CM | POA: Diagnosis not present

## 2023-12-08 DIAGNOSIS — G40909 Epilepsy, unspecified, not intractable, without status epilepticus: Secondary | ICD-10-CM

## 2023-12-08 DIAGNOSIS — G301 Alzheimer's disease with late onset: Secondary | ICD-10-CM

## 2023-12-08 DIAGNOSIS — L409 Psoriasis, unspecified: Secondary | ICD-10-CM

## 2023-12-08 DIAGNOSIS — M158 Other polyosteoarthritis: Secondary | ICD-10-CM

## 2023-12-08 DIAGNOSIS — F0280 Dementia in other diseases classified elsewhere without behavioral disturbance: Secondary | ICD-10-CM

## 2023-12-08 MED ORDER — CLOBETASOL PROPIONATE 0.05 % EX CREA: 1.0000 | TOPICAL_CREAM | Freq: Two times a day (BID) | CUTANEOUS | Status: AC

## 2023-12-08 NOTE — Assessment & Plan Note (Signed)
 Stable, no acute changes in cognitive or functional status, continue supportive care.

## 2023-12-08 NOTE — Assessment & Plan Note (Signed)
 Stable, no recent seizures, continues keppra  at bedtime.

## 2023-12-08 NOTE — Assessment & Plan Note (Signed)
 Worsening psoriasis noted, add clobetasol cream back BID PRN flare

## 2023-12-08 NOTE — Assessment & Plan Note (Signed)
 Controlled on miralax , continue current regimen.

## 2023-12-08 NOTE — Progress Notes (Signed)
 Location:  Other [facility].  Nursing Home Room Number: [facility] [address] Place of Service:  SNF (31) [facility], NP  PCP: Valrie Gehrig, MD  Patient Care Team: Valrie Gehrig, MD as PCP - General (Family Medicine) Marquita Situ, Magali Schmitz, MD (General Surgery) Requested, Self Gwyn Leos, MD as Consulting Physician (Internal Medicine)  Extended Emergency Contact Information Primary Emergency Contact: Minor Amble Address: [address]          [place], [place] [address] [place] Home Phone: [phone] Mobile Phone: [phone] Relation: Spouse Secondary Emergency Contact: Sherril Dole  [place] Home Phone: [phone] Mobile Phone: [phone] Relation: Son  Goals of care: Advanced Directive information    12/08/2023    2:42 PM  Advanced Directives  Does Patient Have a Medical Advance Directive? Yes  Type of Advance Directive Out of facility DNR (pink MOST or yellow form)  Does patient want to make changes to medical advance directive? No - Patient declined     Chief Complaint  Patient presents with   Medical Management of Chronic Issues    Medical Management of Chronic Issues.     HPI:  Pt is a 83 y.o. female seen today for medical management of chronic disease.  Overall she has been doing well.  She is followed by dermatology due to her tinea pedis  Staff noticed increase in psoriasis and applying aquaphor  No increase behaviors.  Continues to eat well.   No complaints of pain.   Past Medical History:  Diagnosis Date   Arthritis    Brain tumor (HCC) 1995   meningeoma   Breast cancer (HCC) 2018   left breast; surgery 01/11/15 with Dr. Marquita Situ; path with invasive mammary and DCIS   Breast cancer of upper-inner quadrant of left female breast (HCC) 12/15/14   Completed radiation end of December and finished chemotherapy 2 weeks ago, Left breast invasive mammary carcinoma, T1cN16mic (1.5 cm);  Grade 3, [name], [name], [name], .   Cataract    bilat    DDD (degenerative disc disease), lumbar    Lumbar, previously evaluated by Dr. 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   Numbness and tingling    right hand    Osteoarthritis    Osteoporosis, post-menopausal    Personal history of chemotherapy    Personal history of radiation therapy    Shoulder pain, right    Wears glasses    Past Surgical History:  Procedure Laterality Date   APPENDECTOMY  1950   BRAIN SURGERY  1995   left frontal/temporal   BREAST BIOPSY Left 12/15/14   confirmed DCIS   BREAST BIOPSY Left 01/08/2015   Procedure: BREAST BIOPSY WITH NEEDLE LOCALIZATION;  Surgeon: Marshall Skeeter, MD;  Location: [facility] ORS;  Service: General;  Laterality: Left;   BREAST EXCISIONAL BIOPSY Left 1997   BREAST LUMPECTOMY Left 01/08/2015   Procedure: LUMPECTOMY;  Surgeon: Susana Enter  Gleen Lance, MD;  Location: [facility] ORS;  Service: General;  Laterality: Left;   COLONOSCOPY  2010   Dr. Dawne Euler   ORIF ANKLE FRACTURE Right 08/05/2017   Procedure: OPEN REDUCTION INTERNAL FIXATION (ORIF) ANKLE FRACTURE;  Surgeon: Marlynn Singer, MD;  Location: [facility] ORS;  Service: Orthopedics;  Laterality: Right;   PORTACATH PLACEMENT Right 01/16/2015   Procedure: INSERTION PORT-A-CATH;  Surgeon: Marshall Skeeter, MD;  Location: [facility] ORS;  Service: General;  Laterality: Right;   SENTINEL NODE BIOPSY Left 01/16/2015   Procedure: SENTINEL NODE BIOPSY;  Surgeon: Marshall Skeeter, MD;  Location: [facility] ORS;   Service: General;  Laterality: Left;   TOTAL HIP ARTHROPLASTY Right 09/04/2015   Procedure: RIGHT TOTAL HIP ARTHROPLASTY ANTERIOR APPROACH;  Surgeon: Claiborne Crew, MD;  Location: [facility];  Service: Orthopedics;  Laterality: Right;    Allergies  Allergen Reactions   Donepezil Other (See Comments)    Other reaction(s): Other (See Comments) Nightmares    Outpatient Encounter Medications as of 12/08/2023  Medication Sig   acetaminophen  (TYLENOL ) 325 MG tablet Take 650 mg by mouth every 8 (eight) hours as needed. Give Two tablets by mouth twice daily   ARTIFICIAL TEARS 1 % ophthalmic solution Place 1 drop into both eyes 2 (two) times daily.   clobetasol cream (TEMOVATE) 0.05 % Apply 1 Application topically 2 (two) times daily.   ketoconazole (NIZORAL) 2 % cream Apply 1 Application topically daily.   levETIRAcetam  (KEPPRA ) 250 MG tablet 1 tablet daily at bedtime   polyethylene glycol (MIRALAX  / GLYCOLAX ) 17 g packet Take 17 g by mouth daily as needed. One scoop by mouth once daily Monday, Wednesday and Friday.   Travoprost, BAK Free, (TRAVATAN) 0.004 % SOLN ophthalmic solution Place 1 drop into both eyes at bedtime.   Zinc Oxide (TRIPLE PASTE) 12.8 % ointment Apply 1 Application topically. Every shift. (Patient not taking: Reported on 12/08/2023)   No facility-administered encounter medications on file as of 12/08/2023.    Review of Systems  Unable to perform ROS: Dementia     Immunization History  Administered Date(s) Administered   Fluad Quad(high Dose 65+) 05/04/2019   Influenza Inj Mdck Quad Pf 05/21/2022   Influenza Split 03/28/2014   Influenza,inj,Quad PF,6+ Mos 04/27/2016   Influenza-Unspecified 05/01/2017, 05/20/2023   PFIZER Comirnaty(Gray Top)Covid-19 Tri-Sucrose Vaccine 08/04/2019, 08/25/2019, 12/07/2020   PFIZER(Purple Top)SARS-COV-2 Vaccination 08/04/2019, 08/25/2019, 04/05/2020, 12/07/2020, 04/24/2023   Pneumococcal Conjugate-13 11/27/2015   Pneumococcal Polysaccharide-23  06/02/2013   Tdap 06/02/2013   Zoster Recombinant(Shingrix) 05/17/2020, 05/29/2023   Pertinent  Health Maintenance Due  Topic Date Due   INFLUENZA VACCINE  02/26/2024   DEXA SCAN  Completed      08/12/2021    8:58 AM 09/24/2021    2:25 PM 11/05/2021    3:19 PM 01/06/2022    2:00 PM 03/23/2023    1:49 PM  Fall Risk  Falls in the past year?     1  Was there an injury with Fall?     0  Fall Risk Category Calculator     2  (RETIRED) Patient Fall Risk Level High fall risk High fall risk High fall risk High fall risk   Patient at Risk for Falls Due to     History of fall(s);Impaired balance/gait;Orthopedic patient;Impaired mobility   Functional Status Survey:    Vitals:   12/08/23 1429  BP: 137/87  Pulse: 88  Resp: 18  Temp: 97.9 F (36.6 C)  SpO2: 94%  Weight: 200 lb 6.4 oz (90.9  kg)  Height: 5\' 4"  (1.626 m)   Body mass index is 34.4 kg/m. Physical Exam Constitutional:      General: She is not in acute distress.    Appearance: She is well-developed. She is not diaphoretic.  HENT:     Head: Normocephalic and atraumatic.     Mouth/Throat:     Pharynx: No oropharyngeal exudate.  Eyes:     Conjunctiva/sclera: Conjunctivae normal.     Pupils: Pupils are equal, round, and reactive to light.  Cardiovascular:     Rate and Rhythm: Normal rate and regular rhythm.     Heart sounds: Normal heart sounds.  Pulmonary:     Effort: Pulmonary effort is normal.     Breath sounds: Normal breath sounds.  Abdominal:     General: Bowel sounds are normal.     Palpations: Abdomen is soft.  Musculoskeletal:     Cervical back: Normal range of motion and neck supple.     Right lower leg: No edema.     Left lower leg: No edema.  Skin:    General: Skin is warm and dry.     Findings: Rash (noted to hairline to back of neck) present.  Neurological:     Mental Status: She is alert.  Psychiatric:        Mood and Affect: Mood normal.     Labs reviewed: Recent Labs    05/14/23 0000  06/18/23 0000 10/22/23 0000  NA 139 140 139  K 4.3 4.0 4.3  CL 99 101 102  CO2 31* 31* 30*  BUN 17 18 14   CREATININE 0.8 0.9 0.6  CALCIUM  9.1 8.6* 8.4*   Recent Labs    05/14/23 0000 10/22/23 0000  AST 17 16  ALT 21 25  ALKPHOS 70 57  ALBUMIN 3.9 3.6   Recent Labs    05/14/23 0000 10/22/23 0000  WBC 7.2 6.8  HGB 14.2 12.6  HCT 45 39  PLT 293 275   No results found for: "TSH" Lab Results  Component Value Date   HGBA1C 6.3 10/22/2023   Lab Results  Component Value Date   CHOL 239 (A) 08/13/2023   HDL 38 08/13/2023   LDLCALC 162 08/13/2023   TRIG 233 (A) 08/13/2023    Significant Diagnostic Results in last 30 days:  No results found.  Assessment/Plan Arthritis, degenerative Stable without complaints of pain, continues on tylenol    Chronic idiopathic constipation Controlled on miralax , continue current regimen.   Nonintractable epilepsy without status epilepticus (HCC) Stable, no recent seizures, continues keppra  at bedtime.   Psoriasis Worsening psoriasis noted, add clobetasol cream back BID PRN flare  Late onset Alzheimer's disease without behavioral disturbance (HCC) Stable, no acute changes in cognitive or functional status, continue supportive care.    Samin Milke K. Denney Fisherman [facility] [phone]

## 2023-12-08 NOTE — Assessment & Plan Note (Signed)
 Stable without complaints of pain, continues on tylenol 

## 2023-12-10 ENCOUNTER — Other Ambulatory Visit: Payer: Self-pay

## 2024-02-24 ENCOUNTER — Encounter: Payer: Self-pay | Admitting: Student

## 2024-02-24 ENCOUNTER — Non-Acute Institutional Stay (SKILLED_NURSING_FACILITY): Payer: Self-pay | Admitting: Student

## 2024-02-24 DIAGNOSIS — K5904 Chronic idiopathic constipation: Secondary | ICD-10-CM

## 2024-02-24 DIAGNOSIS — I427 Cardiomyopathy due to drug and external agent: Secondary | ICD-10-CM | POA: Diagnosis not present

## 2024-02-24 DIAGNOSIS — G301 Alzheimer's disease with late onset: Secondary | ICD-10-CM

## 2024-02-24 DIAGNOSIS — T451X5A Adverse effect of antineoplastic and immunosuppressive drugs, initial encounter: Secondary | ICD-10-CM

## 2024-02-24 DIAGNOSIS — E66812 Obesity, class 2: Secondary | ICD-10-CM | POA: Insufficient documentation

## 2024-02-24 DIAGNOSIS — F0280 Dementia in other diseases classified elsewhere without behavioral disturbance: Secondary | ICD-10-CM

## 2024-02-24 DIAGNOSIS — Z853 Personal history of malignant neoplasm of breast: Secondary | ICD-10-CM

## 2024-02-24 NOTE — Progress Notes (Signed)
 Location:  Other [facility].  Nursing Home Room Number: [facility] [id number] Place of Service:  SNF [phone]) Provider:  Abdul Fine, MD  Patient Care Team: Abdul Fine, MD as PCP - General (Family Medicine) Dessa, Reyes ORN, MD (General Surgery) Requested, Self Rennie Cindy SAUNDERS, MD as Consulting Physician (Internal Medicine)  Extended Emergency Contact Information Primary Emergency Contact: Hershal Ozell ORN Address: [address]          [place], [place] [address] [place] Home Phone: [phone] Mobile Phone: [phone] Relation: Spouse Secondary Emergency Contact: Hershal PONCE Ozell  [place] Home Phone: [phone] Mobile Phone: [phone] Relation: Son  Code Status:  DNR Goals of care: Advanced Directive information    02/24/2024   10:02 AM  Advanced Directives  Does Patient Have a Medical Advance Directive? Yes  Type of Advance Directive Out of facility DNR (pink MOST or yellow form)  Does patient want to make changes to medical advance directive? No - Patient declined     Chief Complaint  Patient presents with   Medical Management of Chronic Issues    Medical Management of Chronic Issues.     HPI:  Pt is a 83 y.o. female seen today for medical management of chronic diseases.    History of Present Illness The patient, with a history of dementia, presents for a routine follow-up.   She has a history of dementia. Her spouse noted in previous conversation that she is smiling and reading every day.  She is eating and sleeping well, but she is unable to exercise.  Past Medical History - Dementia    Assessment and Plan   Past Medical History:  Diagnosis Date   Arthritis    Brain tumor (HCC) 1995   meningeoma   Breast cancer (HCC) 2018   left breast; surgery 01/11/15 with Dr. 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   Cataract    bilat    DDD (degenerative disc disease), lumbar    Lumbar, previously evaluated by Dr. 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   Numbness and tingling    right hand    Osteoarthritis    Osteoporosis, post-menopausal    Personal history of chemotherapy    Personal history of radiation therapy    Shoulder pain, right    Wears glasses    Past Surgical History:  Procedure Laterality Date   APPENDECTOMY  1950   BRAIN SURGERY  1995   left frontal/temporal   BREAST BIOPSY Left 12/15/14   confirmed DCIS   BREAST BIOPSY Left 01/08/2015   Procedure: BREAST BIOPSY WITH NEEDLE LOCALIZATION;  Surgeon: Reyes ORN Dessa, MD;  Location: [facility] ORS;  Service:  General;  Laterality: Left;   BREAST EXCISIONAL BIOPSY Left 1997   BREAST LUMPECTOMY Left 01/08/2015   Procedure: LUMPECTOMY;  Surgeon: Reyes LELON Cota, MD;  Location: [facility] ORS;  Service: General;  Laterality: Left;   COLONOSCOPY  2010   Dr. Geryl   ORIF ANKLE FRACTURE Right 08/05/2017   Procedure: OPEN REDUCTION INTERNAL FIXATION (ORIF) ANKLE FRACTURE;  Surgeon: Cleotilde Barrio, MD;  Location: [facility] ORS;  Service: Orthopedics;  Laterality: Right;   PORTACATH PLACEMENT Right 01/16/2015   Procedure: INSERTION PORT-A-CATH;  Surgeon: Reyes LELON Cota, MD;  Location: [facility] ORS;  Service: General;  Laterality:  Right;   SENTINEL NODE BIOPSY Left 01/16/2015   Procedure: SENTINEL NODE BIOPSY;  Surgeon: Reyes LELON Cota, MD;  Location: [facility] ORS;  Service: General;  Laterality: Left;   TOTAL HIP ARTHROPLASTY Right 09/04/2015   Procedure: RIGHT TOTAL HIP ARTHROPLASTY ANTERIOR APPROACH;  Surgeon: Donnice Car, MD;  Location: [facility] ORS;  Service: Orthopedics;  Laterality: Right;    Allergies  Allergen Reactions   Donepezil Other (See Comments)    Other reaction(s): Other (See Comments) Nightmares    Outpatient Encounter Medications as of 02/24/2024  Medication Sig   acetaminophen  (TYLENOL ) 325 MG tablet Take 650 mg by mouth every 8 (eight) hours as needed. Give Two tablets by mouth twice daily   ARTIFICIAL TEARS 1 % ophthalmic solution Place 1 drop into both eyes 2 (two) times daily.   clobetasol  cream (TEMOVATE ) 0.05 % Apply 1 Application topically 2 (two) times daily.   ketoconazole (NIZORAL) 2 % cream Apply 1 Application topically daily.   levETIRAcetam  (KEPPRA ) 250 MG tablet 1 tablet daily at bedtime   nystatin (MYCOSTATIN/NYSTOP) powder Apply 1 Application topically 3 (three) times daily as needed.   polyethylene glycol (MIRALAX  / GLYCOLAX ) 17 g packet Take 17 g by mouth daily as needed. One scoop by mouth once daily Monday, Wednesday and Friday.   Travoprost, BAK Free, (TRAVATAN) 0.004 % SOLN ophthalmic solution Place 1 drop into both eyes at bedtime.   Zinc Oxide (TRIPLE PASTE) 12.8 % ointment Apply 1 Application topically. Every shift. (Patient not taking: Reported on 02/24/2024)   No facility-administered encounter medications on file as of 02/24/2024.    Review of Systems  Immunization History  Administered Date(s) Administered   Fluad Quad(high Dose 65+) 05/04/2019   Influenza Inj Mdck Quad Pf 05/21/2022   Influenza Split 03/28/2014   Influenza,inj,Quad PF,6+ Mos 04/27/2016   Influenza-Unspecified 05/01/2017, 05/20/2023   PFIZER Comirnaty(Gray Top)Covid-19 Tri-Sucrose Vaccine 08/04/2019,  08/25/2019, 12/07/2020   PFIZER(Purple Top)SARS-COV-2 Vaccination 08/04/2019, 08/25/2019, 04/05/2020, 12/07/2020, 04/24/2023   Pneumococcal Conjugate-13 11/27/2015   Pneumococcal Polysaccharide-23 06/02/2013   Tdap 06/02/2013   Unspecified SARS-COV-2 Vaccination 11/06/2023   Zoster Recombinant(Shingrix) 05/17/2020, 05/29/2023   Pertinent  Health Maintenance Due  Topic Date Due   INFLUENZA VACCINE  02/26/2024   DEXA SCAN  Completed      08/12/2021    8:58 AM 09/24/2021    2:25 PM 11/05/2021    3:19 PM 01/06/2022    2:00 PM 03/23/2023    1:49 PM  Fall Risk  Falls in the past year?     1  Was there an injury with Fall?     0  Fall Risk Category Calculator     2  (RETIRED) Patient Fall Risk Level High fall risk  High fall risk  High fall risk  High fall risk    Patient at Risk for Falls Due to     History of fall(s);Impaired balance/gait;Orthopedic  patient;Impaired mobility     Data saved with a previous flowsheet row definition   Functional Status Survey:    Vitals:   02/24/24 0957  BP: 105/66  Pulse: 79  Resp: 20  Temp: 97.7 F (36.5 C)  SpO2: 94%  Weight: 207 lb (93.9 kg)  Height: 5' 4 (1.626 m)   Body mass index is 35.53 kg/m. Physical Exam Constitutional:      Appearance: She is obese.  Cardiovascular:     Rate and Rhythm: Normal rate.     Pulses: Normal pulses.  Pulmonary:     Effort: Pulmonary effort is normal.  Neurological:     Mental Status: She is alert. She is disoriented.     Comments: Minimally conversant     Labs reviewed: Recent Labs    05/14/23 0000 06/18/23 0000 10/22/23 0000  NA 139 140 139  K 4.3 4.0 4.3  CL 99 101 102  CO2 31* 31* 30*  BUN 17 18 14   CREATININE 0.8 0.9 0.6  CALCIUM  9.1 8.6* 8.4*   Recent Labs    05/14/23 0000 10/22/23 0000  AST 17 16  ALT 21 25  ALKPHOS 70 57  ALBUMIN 3.9 3.6   Recent Labs    05/14/23 0000 10/22/23 0000  WBC 7.2 6.8  HGB 14.2 12.6  HCT 45 39  PLT 293 275   No results found for:  TSH Lab Results  Component Value Date   HGBA1C 6.3 10/22/2023   Lab Results  Component Value Date   CHOL 239 (A) 08/13/2023   HDL 38 08/13/2023   LDLCALC 162 08/13/2023   TRIG 233 (A) 08/13/2023    Significant Diagnostic Results in last 30 days:  No results found.  Assessment/Plan Obesity, morbid (HCC), Chronic  Chemotherapy induced cardiomyopathy (HCC)  Chronic idiopathic constipation  Late onset Alzheimer's disease without behavioral disturbance (HCC)  History of breast cancer Patient with continues to gain some weight due to high caloric intake. Consider initiating metformin to help with weight management and prediabetes in the setting of weight gain. No evidence of swelling on exam at this time. She has gained 30 lb since admission 1 year ago. Continue to monitor for fluid status given hx of chemotherapy induced cardiomyopathy. Regular bowel movements at this time. Dementia is well-managed with no acute concerns. She is comfortable, eating well, and sleeping well. She is unable to exercise, contributing to weight gain. She appears content and not in pain or discomfort. Continue supportive care  Family/ staff Communication: nursing  Labs/tests ordered:  none

## 2024-03-02 ENCOUNTER — Other Ambulatory Visit: Payer: Self-pay

## 2024-03-07 LAB — TSH: TSH: 2.6 (ref 0.41–5.90)

## 2024-03-22 ENCOUNTER — Encounter: Payer: Self-pay | Admitting: Nurse Practitioner

## 2024-03-22 ENCOUNTER — Non-Acute Institutional Stay (SKILLED_NURSING_FACILITY): Payer: Self-pay | Admitting: Nurse Practitioner

## 2024-03-22 DIAGNOSIS — G40909 Epilepsy, unspecified, not intractable, without status epilepticus: Secondary | ICD-10-CM | POA: Diagnosis not present

## 2024-03-22 DIAGNOSIS — M150 Primary generalized (osteo)arthritis: Secondary | ICD-10-CM

## 2024-03-22 DIAGNOSIS — F0280 Dementia in other diseases classified elsewhere without behavioral disturbance: Secondary | ICD-10-CM

## 2024-03-22 DIAGNOSIS — R7303 Prediabetes: Secondary | ICD-10-CM | POA: Insufficient documentation

## 2024-03-22 DIAGNOSIS — G301 Alzheimer's disease with late onset: Secondary | ICD-10-CM

## 2024-03-22 DIAGNOSIS — K5904 Chronic idiopathic constipation: Secondary | ICD-10-CM

## 2024-03-22 DIAGNOSIS — R739 Hyperglycemia, unspecified: Secondary | ICD-10-CM | POA: Insufficient documentation

## 2024-03-22 NOTE — Assessment & Plan Note (Signed)
 Stable, no recent seizures, continues keppra  at bedtime.

## 2024-03-22 NOTE — Assessment & Plan Note (Signed)
 Controlled on miralax , continue current regimen.

## 2024-03-22 NOTE — Assessment & Plan Note (Signed)
 Stable without complaints of pain, continues on tylenol 

## 2024-03-22 NOTE — Assessment & Plan Note (Signed)
 Noted on recent A1c, will monitor

## 2024-03-22 NOTE — Assessment & Plan Note (Signed)
 Stable, no acute changes in cognitive or functional status, continue supportive care.

## 2024-03-22 NOTE — Progress Notes (Signed)
 Location:  Other [facility]  Nursing Home Room Number: [id number] Place of Service:  SNF [phone]) Harlene An, NP  PCP: Abdul Fine, MD  Patient Care Team: Abdul Fine, MD as PCP - General (Family Medicine) Dessa, Reyes ORN, MD (General Surgery) Requested, Self Rennie Cindy SAUNDERS, MD as Consulting Physician (Internal Medicine)  Extended Emergency Contact Information Primary Emergency Contact: Hershal Ozell ORN Address: [address]          [place], [place] [address] [place] Home Phone: [phone] Mobile Phone: [phone] Relation: Spouse Secondary Emergency Contact: Hershal PONCE Ozell  [place] Home Phone: [phone] Mobile Phone: [phone] Relation: Son  Goals of care: Advanced Directive information    02/24/2024   10:02 AM  Advanced Directives  Does Patient Have a Medical Advance Directive? Yes  Type of Advance Directive Out of facility DNR (pink MOST or yellow form)  Does patient want to make changes to medical advance directive? No - Patient declined     Chief Complaint  Patient presents with   Medical Management of Chronic Issues    Medical Management of Chronic Issues.     HPI:  Pt is a 83 y.o. female seen today for medical management of chronic disease.  Pt at [facility] for long term care due to dementia She continues to eat well with ongoing weight gain Recent TSH normal No increase in edema or fluid retention.  Continues on scheduled tylenol  for pain- shows no signs of pain  No recent seizures   Past Medical History:  Diagnosis Date   Arthritis    Brain tumor (HCC) 1995   meningeoma   Breast cancer (HCC) 2018   left breast; surgery 01/11/15 with Dr. Dessa; path with invasive mammary and DCIS   Breast cancer of upper-inner quadrant of left female breast (HCC) 12/15/14   Completed radiation end of December and finished chemotherapy 2 weeks ago, Left breast invasive mammary  carcinoma, T1cN65mic (1.5 cm); Grade 3, [name], [name], [name], .   Cataract    bilat    DDD (degenerative disc disease), lumbar    Lumbar, previously evaluated by Dr. 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   Numbness and tingling    right hand    Osteoarthritis    Osteoporosis, post-menopausal    Personal history of chemotherapy    Personal history of radiation therapy    Shoulder pain, right    Wears glasses    Past Surgical History:  Procedure Laterality Date   APPENDECTOMY  1950   BRAIN SURGERY  1995   left frontal/temporal   BREAST BIOPSY Left 12/15/14   confirmed DCIS   BREAST BIOPSY Left 01/08/2015   Procedure: BREAST BIOPSY WITH NEEDLE LOCALIZATION;  Surgeon: Reyes ORN Dessa, MD;  Location: [facility] ORS;  Service: General;  Laterality: Left;   BREAST EXCISIONAL BIOPSY Left 1997   BREAST LUMPECTOMY Left 01/08/2015  Procedure: LUMPECTOMY;  Surgeon: Reyes LELON Cota, MD;  Location: [facility] ORS;  Service: General;  Laterality: Left;   COLONOSCOPY  2010   Dr. Geryl   ORIF ANKLE FRACTURE Right 08/05/2017   Procedure: OPEN REDUCTION INTERNAL FIXATION (ORIF) ANKLE FRACTURE;  Surgeon: Cleotilde Barrio, MD;  Location: [facility] ORS;  Service: Orthopedics;  Laterality: Right;   PORTACATH PLACEMENT Right 01/16/2015   Procedure: INSERTION PORT-A-CATH;  Surgeon: Reyes LELON Cota, MD;  Location: [facility] ORS;  Service: General;  Laterality: Right;   SENTINEL NODE BIOPSY Left 01/16/2015   Procedure: SENTINEL NODE BIOPSY;  Surgeon: Reyes LELON Cota, MD;  Location: [facility] ORS;  Service: General;  Laterality: Left;   TOTAL HIP ARTHROPLASTY Right 09/04/2015   Procedure: RIGHT TOTAL HIP ARTHROPLASTY ANTERIOR APPROACH;  Surgeon: Donnice Car, MD;  Location: [facility];  Service: Orthopedics;  Laterality: Right;    Allergies  Allergen Reactions   Donepezil Other (See Comments)    Other reaction(s): Other (See Comments) Nightmares    Outpatient Encounter Medications as of 03/22/2024  Medication Sig   acetaminophen  (TYLENOL ) 325 MG tablet Take 650 mg by mouth every 8 (eight) hours as needed. Give Two tablets by mouth twice daily   ARTIFICIAL TEARS 1 % ophthalmic solution Place 1 drop into both eyes 2 (two) times daily.   clobetasol  cream (TEMOVATE ) 0.05 % Apply 1 Application topically 2 (two) times daily.   ketoconazole (NIZORAL) 2 % cream Apply 1 Application topically daily.   levETIRAcetam  (KEPPRA ) 250 MG tablet 1 tablet daily at bedtime   nystatin (MYCOSTATIN/NYSTOP) powder Apply 1 Application topically 3 (three) times daily as needed.   polyethylene glycol (MIRALAX  / GLYCOLAX ) 17 g packet Take 17 g by mouth daily as needed. One scoop by mouth once daily Monday, Wednesday and Friday.   Travoprost, BAK Free, (TRAVATAN) 0.004 % SOLN ophthalmic solution Place 1 drop into both eyes at bedtime.   No facility-administered encounter medications on file as of 03/22/2024.    Review of Systems  Unable to perform ROS: Dementia     Immunization History  Administered Date(s) Administered   Fluad Quad(high Dose 65+) 05/04/2019   Influenza Inj Mdck Quad Pf 05/21/2022   Influenza Split 03/28/2014   Influenza,inj,Quad PF,6+ Mos 04/27/2016   Influenza-Unspecified 05/01/2017, 05/20/2023   PFIZER Comirnaty(Gray Top)Covid-19 Tri-Sucrose Vaccine 08/04/2019, 08/25/2019, 12/07/2020   PFIZER(Purple Top)SARS-COV-2 Vaccination 08/04/2019, 08/25/2019, 04/05/2020, 12/07/2020, 04/24/2023   Pneumococcal Conjugate-13 11/27/2015   Pneumococcal  Polysaccharide-23 06/02/2013   Tdap 06/02/2013   Unspecified SARS-COV-2 Vaccination 11/06/2023   Zoster Recombinant(Shingrix) 05/17/2020, 05/29/2023   Pertinent  Health Maintenance Due  Topic Date Due   INFLUENZA VACCINE  02/26/2024   DEXA SCAN  Completed      08/12/2021    8:58 AM 09/24/2021    2:25 PM 11/05/2021    3:19 PM 01/06/2022    2:00 PM 03/23/2023    1:49 PM  Fall Risk  Falls in the past year?     1  Was there an injury with Fall?     0  Fall Risk Category Calculator     2  (RETIRED) Patient Fall Risk Level High fall risk  High fall risk  High fall risk  High fall risk    Patient at Risk for Falls Due to     History of fall(s);Impaired balance/gait;Orthopedic patient;Impaired mobility     Data saved with a previous flowsheet row definition   Functional Status Survey:    Vitals:   03/22/24 1220  BP: 122/62  Pulse: 78  Resp: 20  Temp: 97.8 F (36.6 C)  SpO2: 95%  Weight: 208 lb 6.4 oz (94.5 kg)  Height: 5' 4 (1.626 m)   Body mass index is 35.77 kg/m. Physical Exam Constitutional:      General: She is not in acute distress.    Appearance: She is well-developed. She is not diaphoretic.  HENT:     Head: Normocephalic and atraumatic.     Mouth/Throat:     Pharynx: No oropharyngeal exudate.  Eyes:     Conjunctiva/sclera: Conjunctivae normal.     Pupils: Pupils are equal, round, and reactive to light.  Cardiovascular:     Rate and Rhythm: Normal rate and regular rhythm.     Heart sounds: Normal heart sounds.  Pulmonary:     Effort: Pulmonary effort is normal.     Breath sounds: Normal breath sounds.  Abdominal:     General: Bowel sounds are normal.     Palpations: Abdomen is soft.  Musculoskeletal:     Cervical back: Normal range of motion and neck supple.     Right lower leg: No edema.     Left lower leg: No edema.  Skin:    General: Skin is warm and dry.  Neurological:     Mental Status: She is alert. She is disoriented.     Motor: Weakness  present.     Gait: Gait abnormal.  Psychiatric:        Mood and Affect: Mood normal.     Labs reviewed: Recent Labs    05/14/23 0000 06/18/23 0000 10/22/23 0000  NA 139 140 139  K 4.3 4.0 4.3  CL 99 101 102  CO2 31* 31* 30*  BUN 17 18 14   CREATININE 0.8 0.9 0.6  CALCIUM  9.1 8.6* 8.4*   Recent Labs    05/14/23 0000 10/22/23 0000  AST 17 16  ALT 21 25  ALKPHOS 70 57  ALBUMIN 3.9 3.6   Recent Labs    05/14/23 0000 10/22/23 0000  WBC 7.2 6.8  HGB 14.2 12.6  HCT 45 39  PLT 293 275   Lab Results  Component Value Date   TSH 2.60 03/07/2024   Lab Results  Component Value Date   HGBA1C 6.3 10/22/2023   Lab Results  Component Value Date   CHOL 239 (A) 08/13/2023   HDL 38 08/13/2023   LDLCALC 162 08/13/2023   TRIG 233 (A) 08/13/2023    Significant Diagnostic Results in last 30 days:  No results found.  Assessment/Plan Chronic idiopathic constipation Controlled on miralax , continue current regimen.   Hyperglycemia Noted on recent A1c, will monitor   Late onset Alzheimer's disease without behavioral disturbance (HCC) Stable, no acute changes in cognitive or functional status, continue supportive care.   Nonintractable epilepsy without status epilepticus (HCC) Stable, no recent seizures, continues keppra  at bedtime.   Obesity, morbid (HCC) Noted, continue to limit sweets and snacks Will get RD consult  Arthritis, degenerative Stable without complaints of pain, continues on tylenol       Bearl Talarico K. Caro BODILY [facility] [phone]

## 2024-03-22 NOTE — Assessment & Plan Note (Signed)
 Noted, continue to limit sweets and snacks Will get RD consult

## 2024-03-24 ENCOUNTER — Other Ambulatory Visit: Payer: Self-pay

## 2024-05-24 ENCOUNTER — Encounter: Payer: Self-pay | Admitting: Nurse Practitioner

## 2024-05-24 ENCOUNTER — Non-Acute Institutional Stay (SKILLED_NURSING_FACILITY): Payer: Self-pay | Admitting: Nurse Practitioner

## 2024-05-24 DIAGNOSIS — K5904 Chronic idiopathic constipation: Secondary | ICD-10-CM

## 2024-05-24 DIAGNOSIS — R739 Hyperglycemia, unspecified: Secondary | ICD-10-CM

## 2024-05-24 DIAGNOSIS — F0280 Dementia in other diseases classified elsewhere without behavioral disturbance: Secondary | ICD-10-CM

## 2024-05-24 DIAGNOSIS — M150 Primary generalized (osteo)arthritis: Secondary | ICD-10-CM

## 2024-05-24 DIAGNOSIS — G301 Alzheimer's disease with late onset: Secondary | ICD-10-CM | POA: Diagnosis not present

## 2024-05-24 DIAGNOSIS — G40909 Epilepsy, unspecified, not intractable, without status epilepticus: Secondary | ICD-10-CM

## 2024-05-24 NOTE — Progress Notes (Unsigned)
 Location:  Other [facility] Room Number: [facility] [id number] Place of Service:  SNF [phone]) Harlene An, NP  PCP: Laurence Locus, DO  Patient Care Team: Laurence Locus, DO as PCP - General (Internal Medicine) Dessa, Reyes ORN, MD (General Surgery) Requested, Self Rennie Cindy SAUNDERS, MD as Consulting Physician (Internal Medicine)  Extended Emergency Contact Information Primary Emergency Contact: Hershal Ozell ORN Address: [address]          [place], [place] [address] [place] Home Phone: [phone] Mobile Phone: [phone] Relation: Spouse Secondary Emergency Contact: Hershal PONCE Ozell  [place] Home Phone: [phone] Mobile Phone: [phone] Relation: Son  Goals of care: Advanced Directive information    05/24/2024   11:13 AM  Advanced Directives  Does Patient Have a Medical Advance Directive? Yes  Type of Advance Directive Out of facility DNR (pink MOST or yellow form)  Does patient want to make changes to medical advance directive? No - Patient declined     Chief Complaint  Patient presents with   Medical Management of Chronic Issues    Medical Management of Chronic Issues.     HPI:  Pt is a 83 y.o. female seen today for medical management of chronic disease. Pt with hx of advanced dementia, constipation, seizures, OA at [facility] for long term care in [place] She has been doing well in the last month without acute issues. Continues to eat well per staff. No seizures noted. Moving bowels well.  No wounds noted. She has no signs or symptoms of pain.    Past Medical History:  Diagnosis Date   Arthritis    Brain tumor (HCC) 1995   meningeoma   Breast cancer (HCC) 2018   left breast; surgery 01/11/15 with Dr. 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   Cataract    bilat    DDD (degenerative disc disease), lumbar    Lumbar, previously evaluated by Dr. 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   Numbness and tingling    right hand    Osteoarthritis    Osteoporosis, post-menopausal    Personal history of chemotherapy    Personal history of radiation therapy    Shoulder pain, right    Wears glasses    Past Surgical History:  Procedure Laterality Date   APPENDECTOMY  1950   BRAIN SURGERY  1995   left frontal/temporal   BREAST BIOPSY Left 12/15/14   confirmed DCIS   BREAST BIOPSY Left 01/08/2015   Procedure: BREAST BIOPSY WITH NEEDLE LOCALIZATION;  Surgeon: Reyes ORN Dessa, MD;  Location: [facility] ORS;  Service: General;  Laterality: Left;   BREAST EXCISIONAL BIOPSY Left 1997  BREAST LUMPECTOMY Left 01/08/2015   Procedure: LUMPECTOMY;  Surgeon: Reyes LELON Cota, MD;  Location: [facility] ORS;  Service: General;  Laterality: Left;   COLONOSCOPY  2010   Dr. Geryl   ORIF ANKLE FRACTURE Right 08/05/2017   Procedure: OPEN REDUCTION INTERNAL FIXATION (ORIF) ANKLE FRACTURE;  Surgeon: Cleotilde Barrio, MD;  Location: [facility] ORS;  Service: Orthopedics;  Laterality: Right;   PORTACATH PLACEMENT Right 01/16/2015   Procedure: INSERTION PORT-A-CATH;  Surgeon: Reyes LELON Cota, MD;  Location: [facility] ORS;  Service: General;  Laterality: Right;   SENTINEL NODE BIOPSY Left 01/16/2015   Procedure: SENTINEL  NODE BIOPSY;  Surgeon: Reyes LELON Cota, MD;  Location: [facility] ORS;  Service: General;  Laterality: Left;   TOTAL HIP ARTHROPLASTY Right 09/04/2015   Procedure: RIGHT TOTAL HIP ARTHROPLASTY ANTERIOR APPROACH;  Surgeon: Donnice Car, MD;  Location: [facility] ORS;  Service: Orthopedics;  Laterality: Right;    Allergies  Allergen Reactions   Donepezil Other (See Comments)    Other reaction(s): Other (See Comments) Nightmares    Outpatient Encounter Medications as of 05/24/2024  Medication Sig   acetaminophen  (TYLENOL ) 325 MG tablet Take 650 mg by mouth every 8 (eight) hours as needed. Give Two tablets by mouth twice daily   ARTIFICIAL TEARS 1 % ophthalmic solution Place 1 drop into both eyes 2 (two) times daily.   clobetasol  cream (TEMOVATE ) 0.05 % Apply 1 Application topically 2 (two) times daily.   ketoconazole (NIZORAL) 2 % cream Apply 1 Application topically daily.   levETIRAcetam  (KEPPRA ) 250 MG tablet 1 tablet daily at bedtime   nystatin (MYCOSTATIN/NYSTOP) powder Apply 1 Application topically 3 (three) times daily as needed.   polyethylene glycol (MIRALAX  / GLYCOLAX ) 17 g packet Take 17 g by mouth daily as needed. One scoop by mouth once daily Monday, Wednesday and Friday.   Travoprost, BAK Free, (TRAVATAN) 0.004 % SOLN ophthalmic solution Place 1 drop into both eyes at bedtime.   No facility-administered encounter medications on file as of 05/24/2024.    Review of Systems  Unable to perform ROS: Dementia     Immunization History  Administered Date(s) Administered   Fluad Quad(high Dose 65+) 05/04/2019   Influenza Inj Mdck Quad Pf 05/21/2022   Influenza Split 03/28/2014   Influenza,inj,Quad PF,6+ Mos 04/27/2016   Influenza-Unspecified 05/01/2017, 05/20/2023, 05/10/2024   PFIZER Comirnaty(Gray Top)Covid-19 Tri-Sucrose Vaccine 08/04/2019, 08/25/2019, 12/07/2020   PFIZER(Purple Top)SARS-COV-2 Vaccination 08/04/2019, 08/25/2019, 04/05/2020, 12/07/2020, 04/24/2023   Pneumococcal  Conjugate-13 11/27/2015   Pneumococcal Polysaccharide-23 06/02/2013   Tdap 06/02/2013   Unspecified SARS-COV-2 Vaccination 11/06/2023   Zoster Recombinant(Shingrix) 05/17/2020, 05/29/2023   Pertinent  Health Maintenance Due  Topic Date Due   Mammogram  05/09/2023   Influenza Vaccine  Completed   DEXA SCAN  Completed      08/12/2021    8:58 AM 09/24/2021    2:25 PM 11/05/2021    3:19 PM 01/06/2022    2:00 PM 03/23/2023    1:49 PM  Fall Risk  Falls in the past year?     1  Was there an injury with Fall?     0  Fall Risk Category Calculator     2  (RETIRED) Patient Fall Risk Level High fall risk  High fall risk  High fall risk  High fall risk    Patient at Risk for Falls Due to     History of fall(s);Impaired balance/gait;Orthopedic patient;Impaired mobility     Data saved with a previous flowsheet row definition   Functional Status Survey:  Vitals:   05/24/24 1108 05/24/24 1114  BP: (!) 148/79 (!) 131/59  Pulse: 88   Resp: 20   Temp: 98 F (36.7 C)   SpO2: 93%   Weight: 209 lb 3.2 oz (94.9 kg)   Height: 5' 4 (1.626 m)    Body mass index is 35.91 kg/m.  Wt Readings from Last 3 Encounters:  05/24/24 209 lb 3.2 oz (94.9 kg)  03/22/24 208 lb 6.4 oz (94.5 kg)  02/24/24 207 lb (93.9 kg)    Physical Exam Constitutional:      General: She is not in acute distress.    Appearance: She is well-developed. She is not diaphoretic.  HENT:     Head: Normocephalic and atraumatic.     Mouth/Throat:     Pharynx: No oropharyngeal exudate.  Eyes:     Conjunctiva/sclera: Conjunctivae normal.     Pupils: Pupils are equal, round, and reactive to light.  Cardiovascular:     Rate and Rhythm: Normal rate and regular rhythm.     Heart sounds: Normal heart sounds.  Pulmonary:     Effort: Pulmonary effort is normal.     Breath sounds: Normal breath sounds.  Abdominal:     General: Bowel sounds are normal.     Palpations: Abdomen is soft.  Musculoskeletal:     Cervical back:  Normal range of motion and neck supple.     Right lower leg: No edema.     Left lower leg: No edema.  Skin:    General: Skin is warm and dry.  Neurological:     Mental Status: She is alert. She is disoriented.     Motor: Weakness present.     Gait: Gait abnormal.  Psychiatric:        Mood and Affect: Mood normal.     Labs reviewed: Recent Labs    06/18/23 0000 10/22/23 0000  NA 140 139  K 4.0 4.3  CL 101 102  CO2 31* 30*  BUN 18 14  CREATININE 0.9 0.6  CALCIUM  8.6* 8.4*   Recent Labs    10/22/23 0000  AST 16  ALT 25  ALKPHOS 57  ALBUMIN 3.6   Recent Labs    10/22/23 0000  WBC 6.8  HGB 12.6  HCT 39  PLT 275   Lab Results  Component Value Date   TSH 2.60 03/07/2024   Lab Results  Component Value Date   HGBA1C 6.3 10/22/2023   Lab Results  Component Value Date   CHOL 239 (A) 08/13/2023   HDL 38 08/13/2023   LDLCALC 162 08/13/2023   TRIG 233 (A) 08/13/2023    Significant Diagnostic Results in last 30 days:  No results found.  Assessment/Plan Obesity, morbid (HCC) Noted, staff continues to limit sweets and snacks   Nonintractable epilepsy without status epilepticus (HCC) Stable, no recent seizures, continues keppra  at bedtime.   Late onset Alzheimer's disease without behavioral disturbance (HCC) Stable, advance disease, requires total care from staff.   Hyperglycemia Noted on recent A1c, will monitor every 6 nint   Chronic idiopathic constipation Controlled on miralax , continue current regimen.   Arthritis, degenerative Stable without complaints of pain, continues on tylenol       Jessica K. Caro BODILY [facility] [phone]

## 2024-05-25 ENCOUNTER — Other Ambulatory Visit: Payer: Self-pay

## 2024-05-25 NOTE — Assessment & Plan Note (Signed)
 Controlled on miralax , continue current regimen.

## 2024-05-25 NOTE — Assessment & Plan Note (Signed)
 Stable, advance disease, requires total care from staff.

## 2024-05-25 NOTE — Assessment & Plan Note (Signed)
 Stable without complaints of pain, continues on tylenol 

## 2024-05-25 NOTE — Assessment & Plan Note (Signed)
 Stable, no recent seizures, continues keppra  at bedtime.

## 2024-05-25 NOTE — Assessment & Plan Note (Signed)
 Noted, staff continues to limit sweets and snacks

## 2024-05-25 NOTE — Assessment & Plan Note (Signed)
 Noted on recent A1c, will monitor every 6 nint

## 2024-05-30 LAB — CBC AND DIFFERENTIAL
HCT: 44 (ref 36–46)
Hemoglobin: 14.1 (ref 12.0–16.0)
Neutrophils Absolute: 4856
Platelets: 285 K/uL (ref 150–400)
WBC: 7.1

## 2024-05-30 LAB — COMPREHENSIVE METABOLIC PANEL WITH GFR
Albumin: 4 (ref 3.5–5.0)
Calcium: 9.1 (ref 8.7–10.7)
Globulin: 2.9
eGFR: 82

## 2024-05-30 LAB — HEPATIC FUNCTION PANEL
ALT: 29 U/L (ref 7–35)
AST: 21 (ref 13–35)
Alkaline Phosphatase: 59 (ref 25–125)
Bilirubin, Total: 0.3

## 2024-05-30 LAB — BASIC METABOLIC PANEL WITH GFR
BUN: 15 (ref 4–21)
CO2: 33 — AB (ref 13–22)
Chloride: 101 (ref 99–108)
Creatinine: 0.7 (ref 0.5–1.1)
Glucose: 96
Potassium: 4.1 meq/L (ref 3.5–5.1)
Sodium: 138 (ref 137–147)

## 2024-05-30 LAB — HEMOGLOBIN A1C: Hemoglobin A1C: 6.5

## 2024-05-30 LAB — CBC: RBC: 4.71 (ref 3.87–5.11)

## 2024-06-20 ENCOUNTER — Non-Acute Institutional Stay (SKILLED_NURSING_FACILITY): Admitting: Internal Medicine

## 2024-06-20 ENCOUNTER — Encounter: Payer: Self-pay | Admitting: Internal Medicine

## 2024-06-20 DIAGNOSIS — G301 Alzheimer's disease with late onset: Secondary | ICD-10-CM | POA: Diagnosis not present

## 2024-06-20 DIAGNOSIS — R7303 Prediabetes: Secondary | ICD-10-CM

## 2024-06-20 DIAGNOSIS — E66812 Obesity, class 2: Secondary | ICD-10-CM

## 2024-06-20 DIAGNOSIS — K5904 Chronic idiopathic constipation: Secondary | ICD-10-CM

## 2024-06-20 DIAGNOSIS — G40309 Generalized idiopathic epilepsy and epileptic syndromes, not intractable, without status epilepticus: Secondary | ICD-10-CM

## 2024-06-20 DIAGNOSIS — F02C0 Dementia in other diseases classified elsewhere, severe, without behavioral disturbance, psychotic disturbance, mood disturbance, and anxiety: Secondary | ICD-10-CM

## 2024-06-20 NOTE — Assessment & Plan Note (Addendum)
 06/20/24 Continue with Keppra  250 mg once daily.  No reports of any recent seizures.

## 2024-06-20 NOTE — Progress Notes (Signed)
 [facility] Routine Visit Progress Note    Location:  Other Nursing Home Room Number: [facility] rm 503 Place of Service:  SNF (31)   Laurence Locus, DO   Patient Care Team: Laurence Locus, DO as PCP - General (Internal Medicine) Dessa, Reyes ORN, MD (General Surgery) Requested, Self Rennie Cindy SAUNDERS, MD as Consulting Physician (Internal Medicine)   Extended Emergency Contact Information Primary Emergency Contact: Hershal Ozell ORN Address: [address]          [place], [place] [address] [place] Home Phone: [phone] Mobile Phone: [phone] Relation: Spouse Secondary Emergency Contact: Hershal PONCE Ozell  [place] Home Phone: [phone] Mobile Phone: [phone] Relation: Son   Goals of care: Advanced Directive information    05/24/2024   11:13 AM  Advanced Directives  Does Patient Have a Medical Advance Directive? Yes  Type of Advance Directive Out of facility DNR (pink MOST or yellow form)  Does patient want to make changes to medical advance directive? No - Patient declined    CODE STATUS: Full Code Do Not Resuscitate (DNR)   Chief Complaint  Patient presents with   Medical Management of Chronic Issues    Routine monthly visit     HPI: Pt is a 83 y.o. female seen today for medical management of chronic disease.  [name] is an 83 year old female with a prior history of breast cancer, bilateral hearing loss, history of Alzheimer's type dementia without behavioral disturbance, history of seizures, morbid obesity, who is seen today for routine medical care.  Patient is essentially nonverbal he cannot give history or review of systems.  I was lucky to find the patient's husband sitting next to her.  I introduced myself as the new wellsite geologist at [facility].  I was able to meet with her husband Dr. Ozell Hershal.  He is a retired clinical research associate who used to financial risk analyst in [place].  They moved into [facility] several years ago.  Patient had a fall at home and had to be transported to skilled care after the fall.  After she was assessed, it was felt that the patient should stay at [facility] for long-term care.  This was back in August 2024.  Husband states that the patient's dementia has steadily been declining.  She is no longer verbal.  She only says a few words very rarely.  He thinks that she is listening.  It is hard to assess given the patient's hearing trouble.  She does wear hearing aids.  Husband is most concerned about the patient's weight gain since she was admitted to the facility.  He admits that the patient was already gaining weight prior to her admission to the nursing home but is steadily increased with the patient's decrease in her activities.  Husband states that the patient does not participate in group activities on the unit as much as she should.  When she is admitted in August to 2024, she weighed 174 pounds.  1 year later in August 2025, she weighed 210 pounds.  Currently in November, she weighs 211 pounds.  She is not on any medications that cause weight gain.  Husband is convinced that her weight gain is due to the patient eating more than she should but also not exercising is much as she should.  Since he is a physician, he understands the patient's dementia is not going to get any better.  He understands that trying to make the patient lose weight with medications  such as GLP-1 inhibitors such as Wegovy or Ozempic is not reasonable.  He does visit her on a daily basis as he still lives on campus.  Dr. Hershal is still quite active.  He does travel outside the community to see his PCP at [facility].  He has no other health concerns about his wife.  He states that the patient is a [employer].  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  After they moved to [facility], but before she started losing her memory, she had retired due to the fact that she felt that she  could not keep up with the students in their learning.  Overall, Dr. Hershal is very happy and pleased with his wife's care at [facility]   Past Medical History:  Diagnosis Date   Arthritis    Brain tumor [facility]) 1995   meningeoma   Breast cancer (HCC) 2018   left breast; surgery 01/11/15 with Dr. Dessa; path with invasive mammary and DCIS   Breast cancer of upper-inner quadrant of left female breast (HCC) 12/15/14   Completed radiation end of December and finished chemotherapy 2 weeks ago, Left breast invasive mammary carcinoma, T1cN78mic (1.5 cm); Grade 3, [name], [name], [name], .   Cataract    bilat    DDD (degenerative disc disease), lumbar    Lumbar, previously evaluated by Dr. Kayla Pinal   Dementia [facility])    Fibrocystic breast disease    prior biopsy   Glaucoma    Hard of hearing    wears hearing aides bilat    Hearing loss    History of cancer chemotherapy    History of radiation therapy    Hyperlipidemia, unspecified    Imbalance    Memory impairment    seen by Dr Maree  possible post crainiotomy from radiation   Meningioma [facility])    Left cavernous sinus meningioma, treated with resection and radiation therapy at [facility], 1995.   Numbness and tingling    right hand    Osteoarthritis    Osteoporosis, post-menopausal    Personal history of chemotherapy    Personal history of radiation therapy    Shoulder pain, right    Wears glasses    Past Surgical History:  Procedure Laterality Date   APPENDECTOMY  1950   BRAIN SURGERY  1995   left frontal/temporal   BREAST BIOPSY Left 12/15/14   confirmed DCIS   BREAST BIOPSY Left 01/08/2015   Procedure: BREAST BIOPSY WITH NEEDLE LOCALIZATION;  Surgeon: Reyes LELON Dessa, MD;  Location: [facility] ORS;  Service: General;  Laterality: Left;   BREAST EXCISIONAL BIOPSY Left 1997   BREAST LUMPECTOMY Left 01/08/2015   Procedure: LUMPECTOMY;  Surgeon: Reyes LELON Dessa, MD;  Location: [facility] ORS;   Service: General;  Laterality: Left;   COLONOSCOPY  2010   Dr. Geryl   ORIF ANKLE FRACTURE Right 08/05/2017   Procedure: OPEN REDUCTION INTERNAL FIXATION (ORIF) ANKLE FRACTURE;  Surgeon: Pinal Kayla, MD;  Location: [facility] ORS;  Service: Orthopedics;  Laterality: Right;   PORTACATH PLACEMENT Right 01/16/2015   Procedure: INSERTION PORT-A-CATH;  Surgeon: Reyes LELON Dessa, MD;  Location: [facility] ORS;  Service: General;  Laterality: Right;   SENTINEL NODE BIOPSY Left 01/16/2015   Procedure: SENTINEL NODE BIOPSY;  Surgeon: Reyes LELON Dessa, MD;  Location: [facility] ORS;  Service: General;  Laterality: Left;   TOTAL HIP ARTHROPLASTY Right 09/04/2015   Procedure: RIGHT TOTAL HIP ARTHROPLASTY ANTERIOR APPROACH;  Surgeon: Donnice Car, MD;  Location: [facility];  Service: Orthopedics;  Laterality: Right;     Allergies  Allergen Reactions   Donepezil Other (See Comments)    Other reaction(s): Other (See Comments) Nightmares     Outpatient Encounter Medications as of 06/20/2024  Medication Sig   acetaminophen  (TYLENOL ) 325 MG tablet Take 650 mg by mouth every 8 (eight) hours as needed. Give Two tablets by mouth twice daily   ARTIFICIAL TEARS 1 % ophthalmic solution Place 1 drop into both eyes 2 (two) times daily.   clobetasol  cream (TEMOVATE ) 0.05 % Apply 1 Application topically 2 (two) times daily.   ketoconazole (NIZORAL) 2 % cream Apply 1 Application topically daily.   levETIRAcetam  (KEPPRA ) 250 MG tablet 1 tablet daily at bedtime   nystatin (MYCOSTATIN/NYSTOP) powder Apply 1 Application topically 3 (three) times daily as needed.   polyethylene glycol (MIRALAX  / GLYCOLAX ) 17 g packet Take 17 g by mouth daily as needed. One scoop by mouth once daily Monday, Wednesday and Friday.   Travoprost, BAK Free, (TRAVATAN) 0.004 % SOLN ophthalmic solution Place 1 drop into both eyes at bedtime.   No facility-administered encounter medications on file as of 06/20/2024.     Review of Systems  Unable to perform ROS:  Dementia      Immunization History  Administered Date(s) Administered   Fluad Quad(high Dose 65+) 05/04/2019   Influenza Inj Mdck Quad Pf 05/21/2022   Influenza Split 03/28/2014   Influenza,inj,Quad PF,6+ Mos 04/27/2016   Influenza-Unspecified 05/01/2017, 05/20/2023, 05/10/2024   PFIZER Comirnaty(Gray Top)Covid-19 Tri-Sucrose Vaccine 08/04/2019, 08/25/2019, 12/07/2020   PFIZER(Purple Top)SARS-COV-2 Vaccination 08/04/2019, 08/25/2019, 04/05/2020, 12/07/2020, 04/24/2023   Pneumococcal Conjugate-13 11/27/2015   Pneumococcal Polysaccharide-23 06/02/2013   Tdap 06/02/2013   Unspecified SARS-COV-2 Vaccination 11/06/2023   Zoster Recombinant(Shingrix) 05/17/2020, 05/29/2023   Pertinent  Health Maintenance Due  Topic Date Due   Mammogram  05/09/2023   Influenza Vaccine  Completed   Bone Density Scan  Completed      08/12/2021    8:58 AM 09/24/2021    2:25 PM 11/05/2021    3:19 PM 01/06/2022    2:00 PM 03/23/2023    1:49 PM  Fall Risk  Falls in the past year?     1  Was there an injury with Fall?     0  Fall Risk Category Calculator     2  (RETIRED) Patient Fall Risk Level High fall risk  High fall risk  High fall risk  High fall risk    Patient at Risk for Falls Due to     History of fall(s);Impaired balance/gait;Orthopedic patient;Impaired mobility     Data saved with a previous flowsheet row definition   Functional Status Survey:     Vitals:   06/19/24 0900  BP: 126/74  Pulse: 74  Resp: 20  Temp: 97.6 F (36.4 C)  Weight: 211 lb (95.7 kg)  Height: 5' 4 (1.626 m)   Body mass index is 36.22 kg/m. Physical Exam Vitals and nursing note reviewed.  Constitutional:      Comments: Awake but essentially non-verbal  HENT:     Head: Normocephalic and atraumatic.     Comments: Bilateral cerumen impaction in both external auditory canals. Cardiovascular:     Rate and Rhythm: Normal rate and regular rhythm.  Pulmonary:     Effort: Pulmonary effort is normal. No respiratory  distress.     Breath sounds: Normal breath sounds.  Abdominal:     General: Abdomen is protuberant. Bowel  sounds are normal.     Palpations: Abdomen is soft.  Musculoskeletal:     Right lower leg: Edema present.     Left lower leg: Edema present.     Comments: +1 pitting edema bilateral lower extremities pretibial down to her ankles.  She is wearing compression stockings.  Skin:    General: Skin is warm and dry.     Capillary Refill: Capillary refill takes less than 2 seconds.  Neurological:     Comments: With some difficulty, she was able to answer that her first name is Aldonia.  She was wearing hearing aids but with her bilateral cerumen impaction, I am not sure how much she could hear.  She was otherwise not oriented to place or time or situation.      Labs reviewed: Recent Labs    10/22/23 0000  NA 139  K 4.3  CL 102  CO2 30*  BUN 14  CREATININE 0.6  CALCIUM  8.4*   Recent Labs    10/22/23 0000  AST 16  ALT 25  ALKPHOS 57  ALBUMIN 3.6   Recent Labs    10/22/23 0000  WBC 6.8  HGB 12.6  HCT 39  PLT 275   Lab Results  Component Value Date   TSH 2.60 03/07/2024   Lab Results  Component Value Date   HGBA1C 6.3 10/22/2023   Lab Results  Component Value Date   CHOL 239 (A) 08/13/2023   HDL 38 08/13/2023   LDLCALC 162 08/13/2023   TRIG 233 (A) 08/13/2023   Assessment & Plan Severe late onset Alzheimer dementia without behavioral disturbance, psychotic disturbance, mood disturbance, or anxiety (HCC) 06/20/24 discussed with the patient's husband Dr. Penne that the patient's dementia is quite severe.  She is essentially nonverbal now.  She is not taking any medications for dementia as this would not change her course.  She requires total care from the staff.  She will remain in long-term care.  He is happy with her care otherwise.      Nonintractable generalized idiopathic epilepsy without status epilepticus (HCC) 06/20/24 Continue with Keppra  250 mg once  daily.  No reports of any recent seizures.      Obesity, Class II, BMI 35-39.9 06/20/24 When she is admitted in August to 2024, she weighed 174 pounds.  1 year later in August 2025, she weighed 210 pounds.  Currently in November, she weighs 211 pounds.  She is not on any medications that cause weight gain.  Husband is convinced that her weight gain is due to the patient eating more than she should but also not exercising is much as she should.  He understands that trying to make the patient lose weight with medications such as GLP-1 inhibitors such as Wegovy or Ozempic is not reasonable.        Prediabetes 06/20/24 monitor A1c every 6 months.  A1c 6.5% in 05/2024. Will discuss with pt's husband if he wants pt to start metformin at next routine visit.      Chronic idiopathic constipation 06/20/24 continue with MiraLAX  daily.        Camellia Door, DO [facility] [phone]

## 2024-06-20 NOTE — Assessment & Plan Note (Addendum)
 06/20/24 When she is admitted in August to 2024, she weighed 174 pounds.  1 year later in August 2025, she weighed 210 pounds.  Currently in November, she weighs 211 pounds.  She is not on any medications that cause weight gain.  Husband is convinced that her weight gain is due to the patient eating more than she should but also not exercising is much as she should.  He understands that trying to make the patient lose weight with medications such as GLP-1 inhibitors such as Wegovy or Ozempic is not reasonable.

## 2024-06-20 NOTE — Assessment & Plan Note (Addendum)
 06/20/24 monitor A1c every 6 months.  A1c 6.5% in 05/2024. Will discuss with pt's husband if he wants pt to start metformin at next routine visit.

## 2024-06-20 NOTE — Assessment & Plan Note (Addendum)
 06/20/24 discussed with the patient's husband Dr. Penne that the patient's dementia is quite severe.  She is essentially nonverbal now.  She is not taking any medications for dementia as this would not change her course.  She requires total care from the staff.  She will remain in long-term care.  He is happy with her care otherwise.

## 2024-06-20 NOTE — Assessment & Plan Note (Addendum)
 06/20/24 continue with MiraLAX  daily.

## 2024-06-21 ENCOUNTER — Encounter: Payer: Self-pay | Admitting: Nurse Practitioner

## 2024-06-21 ENCOUNTER — Non-Acute Institutional Stay (SKILLED_NURSING_FACILITY): Admitting: Nurse Practitioner

## 2024-06-21 DIAGNOSIS — Z0000 Encounter for general adult medical examination without abnormal findings: Secondary | ICD-10-CM

## 2024-06-21 NOTE — Progress Notes (Signed)
 Chief Complaint  Patient presents with   Medicare Wellness    AWV     Subjective:   [name] is a 83 y.o. female who presents for a Medicare Annual Wellness Visit.  Allergies (verified) Donepezil   History: Past Medical History:  Diagnosis Date   Arthritis    Brain tumor (HCC) 1995   meningeoma   Breast cancer (HCC) 2018   left breast; surgery 01/11/15 with Dr. Dessa; path with invasive mammary and DCIS   Breast cancer of upper-inner quadrant of left female breast (HCC) 12/15/2014   Completed radiation end of December and finished chemotherapy 2 weeks ago, Left breast invasive mammary carcinoma, T1cN78mic (1.5 cm); Grade 3, [name], [name], [name], .   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   Numbness and tingling    right hand    Osteoarthritis    Osteoporosis, post-menopausal    Personal history of chemotherapy    Personal history of radiation therapy    S/P right THA, AA 09/04/2015   Shoulder pain, right    Wears glasses    Past Surgical History:  Procedure Laterality Date   APPENDECTOMY  1950   BRAIN SURGERY  1995   left frontal/temporal   BREAST BIOPSY Left 12/15/14   confirmed DCIS   BREAST BIOPSY Left  01/08/2015   Procedure: BREAST BIOPSY WITH NEEDLE LOCALIZATION;  Surgeon: Reyes LELON Dessa, MD;  Location: [facility] ORS;  Service: General;  Laterality: Left;   BREAST EXCISIONAL BIOPSY Left 1997   BREAST LUMPECTOMY Left 01/08/2015   Procedure: LUMPECTOMY;  Surgeon: Reyes LELON Dessa, MD;  Location: [facility] ORS;  Service: General;  Laterality: Left;   COLONOSCOPY  2010   Dr. Geryl   ORIF ANKLE FRACTURE Right 08/05/2017   Procedure: OPEN REDUCTION INTERNAL FIXATION (ORIF) ANKLE FRACTURE;  Surgeon: Pinal Kayla, MD;  Location: [facility] ORS;  Service: Orthopedics;  Laterality: Right;   PORTACATH PLACEMENT Right 01/16/2015   Procedure: INSERTION PORT-A-CATH;  Surgeon: Reyes LELON Dessa, MD;  Location: [facility] ORS;  Service: General;  Laterality: Right;   SENTINEL NODE BIOPSY Left 01/16/2015   Procedure: SENTINEL NODE BIOPSY;  Surgeon: Reyes LELON Dessa, MD;  Location: [facility] ORS;  Service: General;  Laterality: Left;   TOTAL HIP ARTHROPLASTY Right 09/04/2015   Procedure: RIGHT TOTAL HIP ARTHROPLASTY ANTERIOR APPROACH;  Surgeon: Donnice Car, MD;  Location: [facility] ORS;  Service: Orthopedics;  Laterality: Right;   Family History  Problem Relation Age of Onset   Breast cancer Sister 84   Addison's disease Mother    Hyperthyroidism Mother    Osteoarthritis Mother    Stroke Father  Heart disease Father    High blood pressure Father    Social History   Occupational History   Not on file  Tobacco Use   Smoking status: Former    Current packs/day: 0.00    Average packs/day: 0.3 packs/day for 5.0 years (1.3 ttl pk-yrs)    Types: Cigarettes    Start date: 07/29/1967    Quit date: 07/28/1972    Years since quitting: 51.9   Smokeless tobacco: Never  Vaping Use   Vaping status: Never Used  Substance and Sexual Activity   Alcohol  use: Yes    Alcohol /week: 1.0 - 2.0 standard drink of alcohol     Types: 1 - 2 Glasses of wine per week    Comment: 1 Glass Wine / Night   Drug use: No   Sexual activity: Not on file    Tobacco Counseling Counseling given: Not Answered  SDOH Screenings   Tobacco Use: Medium Risk (06/21/2024)   See flowsheets for full screening details  Depression Screen Depression Screening Exception Documentation Depression Screening Exception:: Other- indicate reason in comment box Depression Screening Exception Comment:: dementia     Goals Addressed   None    Visit info / Clinical Intake: Medicare Wellness Visit Type:: Subsequent Annual Wellness Visit Persons participating in visit:: caregiver (with patient present) Medicare Wellness Visit Mode:: In-person (required for WTM) Information given by:: caregiver Interpreter Needed?: No AWV questionnaire completed by patient prior to visit?: no Living arrangements:: in nursing facility Typical amount of pain: none Does pain affect daily life?: no Are you currently prescribed opioids?: no  Dietary Habits and Nutritional Risks How many meals a day?: 3 Eats fruit and vegetables daily?: yes Most meals are obtained by: having others provide food In the last 2 weeks, have you had any of the following?: none Diabetic:: no  Functional Status Activities of Daily Living (to include ambulation/medication): (!) Dependent Feeding: Independent Dressing/Grooming: Dependent Bathing: Dependent Toileting: Dependent Transfer: Dependent Ambulation: Dependent Medication Administration: Dependent Home Management: Dependent Manage your own finances?: (!) no Primary transportation is: family/friends; facility / other Concerns about vision?: no *vision screening is required for WTM* Concerns about hearing?: no  Fall Screening Falls in the past year?: 0 Number of falls in past year: 0 Was there an injury with Fall?: 0 Fall Risk Category Calculator: 0 Patient Fall Risk Level: Low Fall Risk  Fall Risk Fall risk Follow up: Falls evaluation completed  Home and Transportation Safety: All rugs have non-skid backing?: N/A, no  rugs All stairs or steps have railings?: N/A, no stairs Grab bars in the bathtub or shower?: yes Have non-skid surface in bathtub or shower?: yes Good home lighting?: yes Regular seat belt use?: yes  Cognitive Assessment Difficulty concentrating, remembering, or making decisions? : yes Will 6CIT or Mini Cog be Completed: no 6CIT or Mini Cog Declined: patient has a diagnosis of dementia or cognitive impairment  Advance Directives (For Healthcare) Does Patient Have a Medical Advance Directive?: Yes Does patient want to make changes to medical advance directive?: No - Patient declined Type of Advance Directive: Out of facility DNR (pink MOST or yellow form) Copy of Healthcare Power of Attorney in Chart?: Yes - validated most recent copy scanned in chart (See row information) Copy of Living Will in Chart?: Yes - validated most recent copy scanned in chart (See row information) Out of facility DNR (pink MOST or yellow form) in Chart? (Ambulatory ONLY): No - copy requested  Reviewed/Updated  Reviewed/Updated: Reviewed All (Medical, Surgical, Family, Medications,  Allergies, Care Teams, Patient Goals)        Objective:    Today's Vitals   06/21/24 1106  BP: 126/74  Pulse: 74  Resp: 20  Temp: 97.6 F (36.4 C)  SpO2: 97%  Weight: 211 lb (95.7 kg)  Height: 5' 4 (1.626 m)   Body mass index is 36.22 kg/m.  Current Medications (verified) Outpatient Encounter Medications as of 06/21/2024  Medication Sig   acetaminophen  (TYLENOL ) 325 MG tablet Take 650 mg by mouth every 8 (eight) hours as needed. Give Two tablets by mouth twice daily   ARTIFICIAL TEARS 1 % ophthalmic solution Place 1 drop into both eyes 2 (two) times daily.   carbamide peroxide (DEBROX) 6.5 % OTIC solution Place 5 drops into both ears 2 (two) times daily.   clobetasol  cream (TEMOVATE ) 0.05 % Apply 1 Application topically 2 (two) times daily.   ketoconazole (NIZORAL) 2 % cream Apply 1 Application topically daily.    levETIRAcetam  (KEPPRA ) 250 MG tablet 1 tablet daily at bedtime   nystatin (MYCOSTATIN/NYSTOP) powder Apply 1 Application topically 3 (three) times daily as needed.   polyethylene glycol (MIRALAX  / GLYCOLAX ) 17 g packet Take 17 g by mouth daily as needed. One scoop by mouth once daily Monday, Wednesday and Friday.   Travoprost, BAK Free, (TRAVATAN) 0.004 % SOLN ophthalmic solution Place 1 drop into both eyes at bedtime.   No facility-administered encounter medications on file as of 06/21/2024.   Hearing/Vision screen No results found. Immunizations and Health Maintenance Health Maintenance  Topic Date Due   DTaP/Tdap/Td (2 - Td or Tdap) 06/03/2023   COVID-19 Vaccine (10 - 2025-26 season) 03/28/2024   Medicare Annual Wellness (AWV)  06/21/2025   Pneumococcal Vaccine: 50+ Years  Completed   Influenza Vaccine  Completed   Bone Density Scan  Completed   Zoster Vaccines- Shingrix  Completed   Meningococcal B Vaccine  Aged Out   Mammogram  Discontinued        Assessment/Plan:  This is a routine wellness examination for [place].  Patient Care Team: Laurence Locus, DO as PCP - General (Internal Medicine) Dessa, Reyes ORN, MD (General Surgery) Requested, Self Rennie Cindy SAUNDERS, MD as Consulting Physician (Internal Medicine)  I have personally reviewed and noted the following in the patient's chart:   Medical and social history Use of alcohol , tobacco or illicit drugs  Current medications and supplements including opioid prescriptions. Functional ability and status Nutritional status Physical activity Advanced directives List of other physicians Hospitalizations, surgeries, and ER visits in previous 12 months Vitals Screenings to include cognitive, depression, and falls Referrals and appointments  Orders Placed This Encounter  Procedures   CBC and differential    This external order was created through the Results Console.   CBC    This external order was created through the  Results Console.   Basic metabolic panel with GFR    This external order was created through the Results Console.   Comprehensive metabolic panel with GFR    This external order was created through the Results Console.   Hepatic function panel    This external order was created through the Results Console.   Hemoglobin A1c    This external order was created through the Results Console.   In addition, I have reviewed and discussed with patient certain preventive protocols, quality metrics, and best practice recommendations. A written personalized care plan for preventive services as well as general preventive health recommendations were provided to patient.   Harlene  MARLA An, NP   06/21/2024

## 2024-06-21 NOTE — Patient Instructions (Signed)
 Ms. Lykins,  Thank you for taking the time for your Medicare Wellness Visit. I appreciate your continued commitment to your health goals. Please review the care plan we discussed, and feel free to reach out if I can assist you further.  Please note that Annual Wellness Visits do not include a physical exam. Some assessments may be limited, especially if the visit was conducted virtually. If needed, we may recommend an in-person follow-up with your provider.  Referrals If a referral was made during today's visit and you haven't received any updates within two weeks, please contact the referred provider directly to check on the status.  Recommended Screenings:  Health Maintenance  Topic Date Due   DTaP/Tdap/Td vaccine (2 - Td or Tdap) 06/03/2023   COVID-19 Vaccine (10 - 2025-26 season) 03/28/2024   Medicare Annual Wellness Visit  05/25/2024   Pneumococcal Vaccine for age over 22  Completed   Flu Shot  Completed   Osteoporosis screening with Bone Density Scan  Completed   Zoster (Shingles) Vaccine  Completed   Meningitis B Vaccine  Aged Out   Breast Cancer Screening  Discontinued       06/21/2024   11:03 AM  Advanced Directives  Does Patient Have a Medical Advance Directive? Yes  Type of Advance Directive Out of facility DNR (pink MOST or yellow form)    Vision: Annual vision screenings are recommended for early detection of glaucoma, cataracts, and diabetic retinopathy. These exams can also reveal signs of chronic conditions such as diabetes and high blood pressure.  Dental: Annual dental screenings help detect early signs of oral cancer, gum disease, and other conditions linked to overall health, including heart disease and diabetes.  Please see the attached documents for additional preventive care recommendations.

## 2024-06-28 ENCOUNTER — Other Ambulatory Visit: Payer: Self-pay

## 2024-07-14 ENCOUNTER — Encounter: Payer: Self-pay | Admitting: Nurse Practitioner

## 2024-07-14 ENCOUNTER — Non-Acute Institutional Stay (SKILLED_NURSING_FACILITY): Payer: Self-pay | Admitting: Nurse Practitioner

## 2024-07-14 DIAGNOSIS — F02C0 Dementia in other diseases classified elsewhere, severe, without behavioral disturbance, psychotic disturbance, mood disturbance, and anxiety: Secondary | ICD-10-CM | POA: Diagnosis not present

## 2024-07-14 DIAGNOSIS — G40309 Generalized idiopathic epilepsy and epileptic syndromes, not intractable, without status epilepticus: Secondary | ICD-10-CM | POA: Diagnosis not present

## 2024-07-14 DIAGNOSIS — M150 Primary generalized (osteo)arthritis: Secondary | ICD-10-CM | POA: Diagnosis not present

## 2024-07-14 DIAGNOSIS — K5904 Chronic idiopathic constipation: Secondary | ICD-10-CM | POA: Diagnosis not present

## 2024-07-14 DIAGNOSIS — G301 Alzheimer's disease with late onset: Secondary | ICD-10-CM | POA: Diagnosis not present

## 2024-07-14 DIAGNOSIS — R7303 Prediabetes: Secondary | ICD-10-CM

## 2024-07-14 NOTE — Assessment & Plan Note (Signed)
 Advanced disease, continue supportive care with family and staff

## 2024-07-14 NOTE — Assessment & Plan Note (Signed)
 A1c 6.5, will continue to monitor and if continues to increase discuss medication management

## 2024-07-14 NOTE — Assessment & Plan Note (Signed)
 Stable, continues on Keppra  250 mg once daily.  No reports of recent seizures.

## 2024-07-14 NOTE — Assessment & Plan Note (Signed)
 Well controlled on miralax  daily, will continue current regimen.

## 2024-07-14 NOTE — Progress Notes (Signed)
 Location:  Other [facility].  Nursing Home Room Number: [facility] [id number] Place of Service:  SNF [phone]) Harlene An, NP  PCP: Laurence Locus, DO  Patient Care Team: Laurence Locus, DO as PCP - General (Internal Medicine) Dessa, Reyes ORN, MD (General Surgery) Requested, Self Rennie Cindy SAUNDERS, MD as Consulting Physician (Internal Medicine)  Extended Emergency Contact Information Primary Emergency Contact: Hershal Ozell ORN Address: [address]          [place], [place] [address] [place] Home Phone: [phone] Mobile Phone: [phone] Relation: Spouse Secondary Emergency Contact: Hershal PONCE Ozell  [place] Home Phone: [phone] Mobile Phone: [phone] Relation: Son  Goals of care: Advanced Directive information    06/21/2024   11:03 AM  Advanced Directives  Does Patient Have a Medical Advance Directive? Yes  Type of Advance Directive Out of facility DNR (pink MOST or yellow form)     Chief Complaint  Patient presents with   Medical Management of Chronic Issues    Medical Management of Chronic Issues.     HPI:  Pt is a 83 y.o. female seen today for medical management of chronic disease. Pt with advanced dementia at [facility] for long term care.  Pt with hx of breast cancer, constipation, psoriasis, seizure, obesity, prediabetes  Her weight has been stable over the last month.  She eats well. She relies on staff for all ADLs   Past Medical History:  Diagnosis Date   Arthritis    Brain tumor (HCC) 1995   meningeoma   Breast cancer (HCC) 2018   left breast; surgery 01/11/15 with Dr. Dessa; path with invasive mammary and DCIS   Breast cancer of upper-inner quadrant of left female breast (HCC) 12/15/2014   Completed radiation end of December and finished chemotherapy 2 weeks ago, Left breast invasive mammary carcinoma, T1cN36mic (1.5 cm); Grade 3, [name], [name], [name], .    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 Kayla Pinal   Dementia [facility])    Fibrocystic breast disease    prior biopsy   Glaucoma    Hard of hearing    wears hearing aides bilat    Hearing loss    History of artificial joint 09/04/2015   History of cancer chemotherapy    History of radiation therapy    Hyperlipidemia, unspecified    Imbalance    Memory impairment    seen by Dr Maree  possible post crainiotomy from radiation   Meningioma [facility])    Left cavernous sinus meningioma, treated with resection and radiation therapy at [facility], 1995.   Numbness and tingling    right hand    Osteoarthritis    Osteoporosis, post-menopausal    Personal history of chemotherapy    Personal history of radiation therapy    S/P right THA, AA 09/04/2015   Shoulder pain, right    Wears glasses    Past Surgical History:  Procedure Laterality Date   APPENDECTOMY  1950   BRAIN SURGERY  1995   left frontal/temporal   BREAST BIOPSY Left 12/15/14   confirmed DCIS   BREAST BIOPSY Left 01/08/2015   Procedure: BREAST BIOPSY WITH NEEDLE LOCALIZATION;  Surgeon: Reyes ORN Dessa, MD;  Location: [facility] ORS;  Service: General;  Laterality: Left;   BREAST EXCISIONAL BIOPSY Left 1997   BREAST LUMPECTOMY Left 01/08/2015   Procedure: LUMPECTOMY;  Surgeon: Reyes LELON Cota, MD;  Location: [facility] ORS;  Service: General;  Laterality: Left;   COLONOSCOPY  2010   Dr. Geryl   ORIF ANKLE FRACTURE Right 08/05/2017   Procedure: OPEN REDUCTION INTERNAL FIXATION (ORIF) ANKLE FRACTURE;  Surgeon: Cleotilde Barrio, MD;  Location: [facility] ORS;  Service: Orthopedics;  Laterality: Right;   PORTACATH PLACEMENT Right 01/16/2015   Procedure: INSERTION PORT-A-CATH;  Surgeon: Reyes LELON Cota, MD;  Location: [facility] ORS;  Service:  General;  Laterality: Right;   SENTINEL NODE BIOPSY Left 01/16/2015   Procedure: SENTINEL NODE BIOPSY;  Surgeon: Reyes LELON Cota, MD;  Location: [facility] ORS;  Service: General;  Laterality: Left;   TOTAL HIP ARTHROPLASTY Right 09/04/2015   Procedure: RIGHT TOTAL HIP ARTHROPLASTY ANTERIOR APPROACH;  Surgeon: Donnice Car, MD;  Location: [facility];  Service: Orthopedics;  Laterality: Right;    Allergies[1]  Outpatient Encounter Medications as of 07/14/2024  Medication Sig   acetaminophen  (TYLENOL ) 325 MG tablet Take 650 mg by mouth every 8 (eight) hours as needed. Give Two tablets by mouth twice daily   ARTIFICIAL TEARS 1 % ophthalmic solution Place 1 drop into both eyes 2 (two) times daily.   clobetasol  cream (TEMOVATE ) 0.05 % Apply 1 Application topically 2 (two) times daily.   ketoconazole (NIZORAL) 2 % cream Apply 1 Application topically daily.   levETIRAcetam  (KEPPRA ) 250 MG tablet 1 tablet daily at bedtime   nystatin (MYCOSTATIN/NYSTOP) powder Apply 1 Application topically 3 (three) times daily as needed.   polyethylene glycol (MIRALAX  / GLYCOLAX ) 17 g packet Take 17 g by mouth daily as needed. One scoop by mouth once daily Monday, Wednesday and Friday.   Travoprost, BAK Free, (TRAVATAN) 0.004 % SOLN ophthalmic solution Place 1 drop into both eyes at bedtime.   carbamide peroxide (DEBROX) 6.5 % OTIC solution Place 5 drops into both ears 2 (two) times daily. (Patient not taking: Reported on 07/14/2024)   No facility-administered encounter medications on file as of 07/14/2024.    Review of Systems  Unable to perform ROS: Dementia     Immunization History  Administered Date(s) Administered   Fluad Quad(high Dose 65+) 05/04/2019   Influenza Inj Mdck Quad Pf 05/21/2022   Influenza Split 03/28/2014   Influenza,inj,Quad PF,6+ Mos 04/27/2016   Influenza-Unspecified 05/01/2017, 05/20/2023, 05/10/2024   PFIZER Comirnaty(Gray Top)Covid-19 Tri-Sucrose Vaccine 08/04/2019, 08/25/2019, 12/07/2020    PFIZER(Purple Top)SARS-COV-2 Vaccination 08/04/2019, 08/25/2019, 04/05/2020, 12/07/2020, 04/24/2023   Pneumococcal Conjugate-13 11/27/2015   Pneumococcal Polysaccharide-23 06/02/2013   Tdap 06/02/2013   Unspecified SARS-COV-2 Vaccination 11/06/2023, 06/06/2024   Zoster Recombinant(Shingrix) 05/17/2020, 05/29/2023   Pertinent  Health Maintenance Due  Topic Date Due   Influenza Vaccine  Completed   Bone Density Scan  Completed   Mammogram  Discontinued      09/24/2021    2:25 PM 11/05/2021    3:19 PM 01/06/2022    2:00 PM 03/23/2023    1:49 PM 06/21/2024   11:03 AM  Fall Risk  Falls in the past year?    1 0  Was there an injury with Fall?    0  0   Fall Risk Category Calculator    2 0  (RETIRED) Patient Fall Risk Level High fall risk  High fall risk  High fall risk     Patient at Risk for Falls Due to    History of fall(s);Impaired balance/gait;Orthopedic patient;Impaired mobility  Fall risk Follow up     Falls evaluation completed     Data saved with a previous flowsheet row definition   Functional Status Survey:    Vitals:   07/14/24 0941  BP: 130/75  Pulse: 65  Resp: 20  Temp: 97.7 F (36.5 C)  SpO2: 95%  Weight: 207 lb (93.9 kg)  Height: 5' 4 (1.626 m)   Body mass index is 35.53 kg/m. Physical Exam Constitutional:      General: She is not in acute distress.    Appearance: She is well-developed. She is not diaphoretic.  HENT:     Head: Normocephalic and atraumatic.     Mouth/Throat:     Pharynx: No oropharyngeal exudate.  Eyes:     Conjunctiva/sclera: Conjunctivae normal.     Pupils: Pupils are equal, round, and reactive to light.  Cardiovascular:     Rate and Rhythm: Normal rate and regular rhythm.     Heart sounds: Normal heart sounds.  Pulmonary:     Effort: Pulmonary effort is normal.     Breath sounds: Normal breath sounds.  Abdominal:     General: Bowel sounds are normal.     Palpations: Abdomen is soft.  Musculoskeletal:     Cervical back:  Normal range of motion and neck supple.     Right lower leg: No edema.     Left lower leg: No edema.  Skin:    General: Skin is warm and dry.  Neurological:     Mental Status: She is alert. She is disoriented.     Motor: Weakness present.     Gait: Gait abnormal.  Psychiatric:        Mood and Affect: Mood normal.     Labs reviewed: Recent Labs    10/22/23 0000 05/30/24 0000  NA 139 138  K 4.3 4.1  CL 102 101  CO2 30* 33*  BUN 14 15  CREATININE 0.6 0.7  CALCIUM  8.4* 9.1   Recent Labs    10/22/23 0000 05/30/24 0000  AST 16 21  ALT 25 29  ALKPHOS 57 59  ALBUMIN 3.6 4.0   Recent Labs    10/22/23 0000 05/30/24 0000  WBC 6.8 7.1  NEUTROABS  --  4,856.00  HGB 12.6 14.1  HCT 39 44  PLT 275 285   Lab Results  Component Value Date   TSH 2.60 03/07/2024   Lab Results  Component Value Date   HGBA1C 6.5 05/30/2024   Lab Results  Component Value Date   CHOL 239 (A) 08/13/2023   HDL 38 08/13/2023   LDLCALC 162 08/13/2023   TRIG 233 (A) 08/13/2023    Significant Diagnostic Results in last 30 days:  No results found.  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   Nonintractable epilepsy without status epilepticus (HCC) Stable, continues on Keppra  250 mg once daily.  No reports of recent seizures.   Chronic idiopathic constipation Well controlled on miralax  daily, will continue current regimen.   Arthritis, degenerative Stable without complaints of pain, continues on scheduled tylenol .    Basil Blakesley K. Caro BODILY [facility] [phone]       [1]   Allergies  Allergen Reactions   Donepezil Other (See Comments)    Other reaction(s): Other (See Comments) Nightmares

## 2024-07-14 NOTE — Assessment & Plan Note (Signed)
 BMI of 35 with prediabets, OA Continue supportive care as she is immobile.

## 2024-07-14 NOTE — Assessment & Plan Note (Signed)
 Stable without complaints of pain, continues on scheduled tylenol .

## 2024-08-09 ENCOUNTER — Encounter: Payer: Self-pay | Admitting: Nurse Practitioner

## 2024-08-09 ENCOUNTER — Non-Acute Institutional Stay (SKILLED_NURSING_FACILITY): Payer: Self-pay | Admitting: Nurse Practitioner

## 2024-08-09 DIAGNOSIS — F02C0 Dementia in other diseases classified elsewhere, severe, without behavioral disturbance, psychotic disturbance, mood disturbance, and anxiety: Secondary | ICD-10-CM

## 2024-08-09 DIAGNOSIS — K5904 Chronic idiopathic constipation: Secondary | ICD-10-CM | POA: Diagnosis not present

## 2024-08-09 DIAGNOSIS — R7303 Prediabetes: Secondary | ICD-10-CM | POA: Diagnosis not present

## 2024-08-09 DIAGNOSIS — G40309 Generalized idiopathic epilepsy and epileptic syndromes, not intractable, without status epilepticus: Secondary | ICD-10-CM | POA: Diagnosis not present

## 2024-08-09 DIAGNOSIS — G301 Alzheimer's disease with late onset: Secondary | ICD-10-CM | POA: Diagnosis not present

## 2024-08-09 DIAGNOSIS — M150 Primary generalized (osteo)arthritis: Secondary | ICD-10-CM | POA: Diagnosis not present

## 2024-08-09 NOTE — Progress Notes (Signed)
 " Location:  Other [facility].  Nursing Home Room Number: [facility] [id number] Place of Service:  SNF [phone]) Harlene An, NP  PCP: Laurence Locus, DO  Patient Care Team: Laurence Locus, DO as PCP - General (Internal Medicine) Dessa, Reyes ORN, MD (General Surgery) Requested, Self Rennie Cindy SAUNDERS, MD as Consulting Physician (Internal Medicine)  Extended Emergency Contact Information Primary Emergency Contact: Hershal Ozell ORN Address: [address]          [place], [place] [address] [place] Home Phone: [phone] Mobile Phone: [phone] Relation: Spouse Secondary Emergency Contact: Hershal PONCE Ozell  [place] Home Phone: [phone] Mobile Phone: [phone] Relation: Son  Goals of care: Advanced Directive information    06/21/2024   11:03 AM  Advanced Directives  Does Patient Have a Medical Advance Directive? Yes  Type of Advance Directive Out of facility DNR (pink MOST or yellow form)     Chief Complaint  Patient presents with   Medical Management of Chronic Issues    Medical Management of Chronic Issues.     HPI:  Pt is a 84 y.o. female seen today for medical management of chronic disease. Pt with hx of dementia, prediabetes, OA, breast cancer, seizures.  She has been doing well in the last months. Nursing reports no acute issues over the month.  She continues to eat well. Weights have been stable.  No worsening LE edema, no shortness of breath noted.  She does not appear to be in pain and does not complain of pain.  No new skin issues reported.  No signs of anxiety or depression.    Past Medical History:  Diagnosis Date   Arthritis    Brain tumor (HCC) 1995   meningeoma   Breast cancer (HCC) 2018   left breast; surgery 01/11/15 with Dr. 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   Numbness and tingling    right hand    Osteoarthritis    Osteoporosis, post-menopausal    Personal history of chemotherapy    Personal history of radiation therapy    S/P right THA, AA 09/04/2015   Shoulder pain, right    Wears glasses    Past Surgical History:  Procedure Laterality Date   APPENDECTOMY  1950   BRAIN SURGERY  1995   left frontal/temporal   BREAST BIOPSY  Left 12/15/14   confirmed DCIS   BREAST BIOPSY Left 01/08/2015   Procedure: BREAST BIOPSY WITH NEEDLE LOCALIZATION;  Surgeon: Reyes LELON Cota, MD;  Location: [facility] ORS;  Service: General;  Laterality: Left;   BREAST EXCISIONAL BIOPSY Left 1997   BREAST LUMPECTOMY Left 01/08/2015   Procedure: LUMPECTOMY;  Surgeon: Reyes LELON Cota, MD;  Location: [facility] ORS;  Service: General;  Laterality: Left;   COLONOSCOPY  2010   Dr. Geryl   ORIF ANKLE FRACTURE Right 08/05/2017   Procedure: OPEN REDUCTION INTERNAL FIXATION (ORIF) ANKLE FRACTURE;  Surgeon: Cleotilde Barrio, MD;  Location: [facility] ORS;  Service: Orthopedics;   Laterality: Right;   PORTACATH PLACEMENT Right 01/16/2015   Procedure: INSERTION PORT-A-CATH;  Surgeon: Reyes LELON Cota, MD;  Location: [facility] ORS;  Service: General;  Laterality: Right;   SENTINEL NODE BIOPSY Left 01/16/2015   Procedure: SENTINEL NODE BIOPSY;  Surgeon: Reyes LELON Cota, MD;  Location: [facility] ORS;  Service: General;  Laterality: Left;   TOTAL HIP ARTHROPLASTY Right 09/04/2015   Procedure: RIGHT TOTAL HIP ARTHROPLASTY ANTERIOR APPROACH;  Surgeon: Donnice Car, MD;  Location: [facility] ORS;  Service: Orthopedics;  Laterality: Right;    Allergies[1]  Outpatient Encounter Medications as of 08/09/2024  Medication Sig   acetaminophen  (TYLENOL ) 325 MG tablet Take 650 mg by mouth every 8 (eight) hours as needed. Give Two tablets by mouth twice daily   ARTIFICIAL TEARS 1 % ophthalmic solution Place 1 drop into both eyes 2 (two) times daily.   clobetasol  cream (TEMOVATE ) 0.05 % Apply 1 Application topically 2 (two) times daily.   ketoconazole (NIZORAL) 2 % cream Apply 1 Application topically daily.   levETIRAcetam  (KEPPRA ) 250 MG tablet 1 tablet daily at bedtime   nystatin (MYCOSTATIN/NYSTOP) powder Apply 1 Application topically 3 (three) times daily as needed.   polyethylene glycol (MIRALAX  / GLYCOLAX ) 17 g packet Take 17 g by mouth daily as needed. One scoop by mouth once daily Monday, Wednesday and Friday.   Travoprost, BAK Free, (TRAVATAN) 0.004 % SOLN ophthalmic solution Place 1 drop into both eyes at bedtime.   carbamide peroxide (DEBROX) 6.5 % OTIC solution Place 5 drops into both ears 2 (two) times daily. (Patient not taking: Reported on 08/09/2024)   No facility-administered encounter medications on file as of 08/09/2024.    Review of Systems  Unable to perform ROS: Dementia     Immunization History  Administered Date(s) Administered   Fluad Quad(high Dose 65+) 05/04/2019   Influenza Inj Mdck Quad Pf 05/21/2022   Influenza Split 03/28/2014   Influenza,inj,Quad PF,6+ Mos 04/27/2016    Influenza-Unspecified 05/01/2017, 05/20/2023, 05/10/2024   PFIZER Comirnaty(Gray Top)Covid-19 Tri-Sucrose Vaccine 08/04/2019, 08/25/2019, 12/07/2020   PFIZER(Purple Top)SARS-COV-2 Vaccination 08/04/2019, 08/25/2019, 04/05/2020, 12/07/2020, 04/24/2023   Pneumococcal Conjugate-13 11/27/2015   Pneumococcal Polysaccharide-23 06/02/2013   Tdap 06/02/2013   Unspecified SARS-COV-2 Vaccination 11/06/2023, 06/06/2024   Zoster Recombinant(Shingrix) 05/17/2020, 05/29/2023   Pertinent  Health Maintenance Due  Topic Date Due   Influenza Vaccine  Completed   Bone Density Scan  Completed   Mammogram  Discontinued      09/24/2021    2:25 PM 11/05/2021    3:19 PM 01/06/2022    2:00 PM 03/23/2023    1:49 PM 06/21/2024   11:03 AM  Fall Risk  Falls in the past year?    1 0  Was there an injury with Fall?    0  0   Fall Risk Category Calculator    2 0  (RETIRED) Patient Fall Risk  Level High fall risk  High fall risk  High fall risk     Patient at Risk for Falls Due to    History of fall(s);Impaired balance/gait;Orthopedic patient;Impaired mobility   Fall risk Follow up     Falls evaluation completed     Data saved with a previous flowsheet row definition   Functional Status Survey:    Vitals:   08/09/24 0949  BP: 129/67  Pulse: 89  Resp: 20  Temp: 97.6 F (36.4 C)  SpO2: 98%  Weight: 213 lb (96.6 kg)  Height: 5' 4 (1.626 m)   Body mass index is 36.56 kg/m. Physical Exam Constitutional:      General: She is not in acute distress.    Appearance: She is well-developed. She is not diaphoretic.  HENT:     Head: Normocephalic and atraumatic.     Mouth/Throat:     Pharynx: No oropharyngeal exudate.  Eyes:     Conjunctiva/sclera: Conjunctivae normal.     Pupils: Pupils are equal, round, and reactive to light.  Cardiovascular:     Rate and Rhythm: Normal rate and regular rhythm.     Heart sounds: Normal heart sounds.  Pulmonary:     Effort: Pulmonary effort is normal.     Breath  sounds: Normal breath sounds.  Abdominal:     General: Bowel sounds are normal.     Palpations: Abdomen is soft.  Musculoskeletal:     Cervical back: Normal range of motion and neck supple.     Right lower leg: No edema.     Left lower leg: No edema.  Skin:    General: Skin is warm and dry.  Neurological:     Mental Status: She is alert.     Motor: Weakness present.     Gait: Gait abnormal.  Psychiatric:        Mood and Affect: Mood normal.     Labs reviewed: Recent Labs    10/22/23 0000 05/30/24 0000  NA 139 138  K 4.3 4.1  CL 102 101  CO2 30* 33*  BUN 14 15  CREATININE 0.6 0.7  CALCIUM  8.4* 9.1   Recent Labs    10/22/23 0000 05/30/24 0000  AST 16 21  ALT 25 29  ALKPHOS 57 59  ALBUMIN 3.6 4.0   Recent Labs    10/22/23 0000 05/30/24 0000  WBC 6.8 7.1  NEUTROABS  --  4,856.00  HGB 12.6 14.1  HCT 39 44  PLT 275 285   Lab Results  Component Value Date   TSH 2.60 03/07/2024   Lab Results  Component Value Date   HGBA1C 6.5 05/30/2024   Lab Results  Component Value Date   CHOL 239 (A) 08/13/2023   HDL 38 08/13/2023   LDLCALC 162 08/13/2023   TRIG 233 (A) 08/13/2023    Significant Diagnostic Results in last 30 days:  No results found.  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  No reports of recent seizures.   Chronic idiopathic constipation Well controlled on miralax  daily, will continue current regimen.   Arthritis, degenerative Stable without complaints of pain, continues on scheduled tylenol .      Rushil Kimbrell K. Caro BODILY [facility] [phone]       [  1]   Allergies Allergen Reactions   Donepezil Other (See Comments)    Other reaction(s): Other (See Comments) Nightmares   "

## 2024-08-16 NOTE — Assessment & Plan Note (Signed)
 Advanced disease, continue supportive care with family and staff

## 2024-08-16 NOTE — Assessment & Plan Note (Signed)
 A1c 6.5, continue to monitor Continue dietary modification

## 2024-08-16 NOTE — Assessment & Plan Note (Signed)
 Stable, continues on Keppra  250 mg once daily.  No reports of recent seizures.

## 2024-08-16 NOTE — Assessment & Plan Note (Signed)
 Well controlled on miralax  daily, will continue current regimen.

## 2024-08-16 NOTE — Assessment & Plan Note (Signed)
 Stable without complaints of pain, continues on scheduled tylenol .

## 2024-08-18 ENCOUNTER — Other Ambulatory Visit: Payer: Self-pay

## 2024-08-19 ENCOUNTER — Non-Acute Institutional Stay (SKILLED_NURSING_FACILITY): Admitting: Orthopedic Surgery

## 2024-08-19 ENCOUNTER — Encounter: Payer: Self-pay | Admitting: Orthopedic Surgery

## 2024-08-19 DIAGNOSIS — G301 Alzheimer's disease with late onset: Secondary | ICD-10-CM

## 2024-08-19 DIAGNOSIS — F02C0 Dementia in other diseases classified elsewhere, severe, without behavioral disturbance, psychotic disturbance, mood disturbance, and anxiety: Secondary | ICD-10-CM | POA: Diagnosis not present

## 2024-08-19 DIAGNOSIS — N6452 Nipple discharge: Secondary | ICD-10-CM

## 2024-08-19 NOTE — Progress Notes (Signed)
 " Location:  Other [facility].  Nursing Home Room Number: [facility] [id number] Place of Service:  SNF [phone]) Provider:  Greig Cluster, NP  PCP: Laurence Locus, DO  Patient Care Team: Laurence Locus, DO as PCP - General (Internal Medicine) Dessa, Reyes ORN, MD (General Surgery) Requested, Self Rennie Cindy SAUNDERS, MD as Consulting Physician (Internal Medicine)  Extended Emergency Contact Information Primary Emergency Contact: Hershal Ozell ORN Address: [address]          [place], [place] [address] [place] Home Phone: [phone] Mobile Phone: [phone] Relation: Spouse Secondary Emergency Contact: Hershal PONCE Ozell  [place] Home Phone: [phone] Mobile Phone: [phone] Relation: Son  Code Status:  DNR Goals of care: Advanced Directive information    08/19/2024   10:05 AM  Advanced Directives  Does Patient Have a Medical Advance Directive? Yes  Type of Advance Directive Out of facility DNR (pink MOST or yellow form)  Does patient want to make changes to medical advance directive? No - Patient declined     Chief Complaint  Patient presents with   Breast Discharge    Nipple Discharge.     HPI:  Pt is a 84 y.o. female seen today for medical management of chronic diseases.    She currently resides on the skilled nursing unit at [facility]. PMH: HLD, prediabetes, epilepsy, Alzheimer's dementia, DDD, breast cancer and obesity.   H/o brain tumor, metastatic breast cancer ER [name] HER-2/neu positive and psoriasis. Poor historian due to dementia. Nonverbal during encounter. Nursing reports nipple discharge. Unclear which breast. No characteristics of discharge given. No apparent discomfort during exam. Afebrile. Vitals stable.   BIMS score 0/15 05/27/2024. No agitation. Dependent with ADLs> needs cueing. Not on medication. Ambulates with wheelchair.    Past Medical History:  Diagnosis Date   Arthritis    Brain tumor (HCC) 1995    meningeoma   Breast cancer (HCC) 2018   left breast; surgery 01/11/15 with Dr. Dessa; path with invasive mammary and DCIS   Breast cancer of upper-inner quadrant of left female breast (HCC) 12/15/2014   Completed radiation end of December and finished chemotherapy 2 weeks ago, Left breast invasive mammary carcinoma, T1cN10mic (1.5 cm); Grade 3, [name], [name], [name], .   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 Kayla Pinal   Dementia [facility])    Fibrocystic breast disease    prior biopsy   Glaucoma    Hard of hearing    wears hearing aides bilat    Hearing loss    History of artificial joint 09/04/2015   History of cancer chemotherapy    History of radiation therapy    Hyperlipidemia, unspecified    Imbalance    Memory impairment    seen by Dr Maree  possible post crainiotomy from radiation   Meningioma [facility])    Left cavernous sinus meningioma, treated with resection and radiation therapy at [facility], 1995.   Numbness and tingling    right hand    Osteoarthritis    Osteoporosis, post-menopausal    Personal history of chemotherapy    Personal history of radiation therapy    S/P right THA, AA 09/04/2015   Shoulder pain, right    Wears glasses  Past Surgical History:  Procedure Laterality Date   APPENDECTOMY  1950   BRAIN SURGERY  1995   left frontal/temporal   BREAST BIOPSY Left 12/15/14   confirmed DCIS   BREAST BIOPSY Left 01/08/2015   Procedure: BREAST BIOPSY WITH NEEDLE LOCALIZATION;  Surgeon: Reyes LELON Cota, MD;  Location: [facility] ORS;  Service: General;  Laterality: Left;   BREAST EXCISIONAL BIOPSY Left 1997   BREAST LUMPECTOMY Left 01/08/2015   Procedure: LUMPECTOMY;  Surgeon: Reyes LELON Cota, MD;  Location: [facility] ORS;   Service: General;  Laterality: Left;   COLONOSCOPY  2010   Dr. Geryl   ORIF ANKLE FRACTURE Right 08/05/2017   Procedure: OPEN REDUCTION INTERNAL FIXATION (ORIF) ANKLE FRACTURE;  Surgeon: Cleotilde Barrio, MD;  Location: [facility] ORS;  Service: Orthopedics;  Laterality: Right;   PORTACATH PLACEMENT Right 01/16/2015   Procedure: INSERTION PORT-A-CATH;  Surgeon: Reyes LELON Cota, MD;  Location: [facility] ORS;  Service: General;  Laterality: Right;   SENTINEL NODE BIOPSY Left 01/16/2015   Procedure: SENTINEL NODE BIOPSY;  Surgeon: Reyes LELON Cota, MD;  Location: [facility] ORS;  Service: General;  Laterality: Left;   TOTAL HIP ARTHROPLASTY Right 09/04/2015   Procedure: RIGHT TOTAL HIP ARTHROPLASTY ANTERIOR APPROACH;  Surgeon: Donnice Car, MD;  Location: [facility];  Service: Orthopedics;  Laterality: Right;    Allergies[1]  Outpatient Encounter Medications as of 08/19/2024  Medication Sig   acetaminophen  (TYLENOL ) 325 MG tablet Take 650 mg by mouth every 8 (eight) hours as needed. Give Two tablets by mouth twice daily   ARTIFICIAL TEARS 1 % ophthalmic solution Place 1 drop into both eyes 2 (two) times daily.   clobetasol  cream (TEMOVATE ) 0.05 % Apply 1 Application topically 2 (two) times daily.   ketoconazole (NIZORAL) 2 % cream Apply 1 Application topically daily.   levETIRAcetam  (KEPPRA ) 250 MG tablet 1 tablet daily at bedtime   nystatin (MYCOSTATIN/NYSTOP) powder Apply 1 Application topically 3 (three) times daily as needed.   polyethylene glycol (MIRALAX  / GLYCOLAX ) 17 g packet Take 17 g by mouth daily as needed. One scoop by mouth once daily Monday, Wednesday and Friday.   Travoprost, BAK Free, (TRAVATAN) 0.004 % SOLN ophthalmic solution Place 1 drop into both eyes at bedtime.   carbamide peroxide (DEBROX) 6.5 % OTIC solution Place 5 drops into both ears 2 (two) times daily. (Patient not taking: Reported on 08/19/2024)   No facility-administered encounter medications on file as of 08/19/2024.    Review of  Systems  Unable to perform ROS: Dementia    Immunization History  Administered Date(s) Administered   Fluad Quad(high Dose 65+) 05/04/2019   Influenza Inj Mdck Quad Pf 05/21/2022   Influenza Split 03/28/2014   Influenza,inj,Quad PF,6+ Mos 04/27/2016   Influenza-Unspecified 05/01/2017, 05/20/2023, 05/10/2024   PFIZER Comirnaty(Gray Top)Covid-19 Tri-Sucrose Vaccine 08/04/2019, 08/25/2019, 12/07/2020   PFIZER(Purple Top)SARS-COV-2 Vaccination 08/04/2019, 08/25/2019, 04/05/2020, 12/07/2020, 04/24/2023   Pneumococcal Conjugate-13 11/27/2015   Pneumococcal Polysaccharide-23 06/02/2013   Tdap 06/02/2013   Unspecified SARS-COV-2 Vaccination 11/06/2023, 06/06/2024   Zoster Recombinant(Shingrix) 05/17/2020, 05/29/2023   Pertinent  Health Maintenance Due  Topic Date Due   Influenza Vaccine  Completed   Bone Density Scan  Completed   Mammogram  Discontinued      09/24/2021    2:25 PM 11/05/2021    3:19 PM 01/06/2022    2:00 PM 03/23/2023    1:49 PM 06/21/2024   11:03 AM  Fall Risk  Falls in the past year?    1 0  Was there an  injury with Fall?    0  0   Fall Risk Category Calculator    2 0  (RETIRED) Patient Fall Risk Level High fall risk  High fall risk  High fall risk     Patient at Risk for Falls Due to    History of fall(s);Impaired balance/gait;Orthopedic patient;Impaired mobility   Fall risk Follow up     Falls evaluation completed     Data saved with a previous flowsheet row definition   Functional Status Survey:    Vitals:   08/19/24 1001  BP: 133/84  Pulse: 91  Resp: 19  Temp: (!) 97.2 F (36.2 C)  SpO2: 95%  Weight: 209 lb 6.4 oz (95 kg)  Height: 5' 4 (1.626 m)   Body mass index is 35.94 kg/m. Physical Exam Vitals reviewed.  Constitutional:      General: She is not in acute distress. HENT:     Head: Normocephalic.     Ears:     Comments: HOH, bilateral hearing aids Eyes:     General:        Right eye: No discharge.        Left eye: No discharge.   Cardiovascular:     Rate and Rhythm: Normal rate and regular rhythm.     Pulses: Normal pulses.     Heart sounds: Normal heart sounds.  Pulmonary:     Effort: Pulmonary effort is normal.     Breath sounds: Normal breath sounds.  Chest:     Chest wall: No deformity or tenderness.  Breasts:    Right: No mass, nipple discharge or tenderness.     Left: No mass, nipple discharge or tenderness.  Abdominal:     General: Bowel sounds are normal.     Palpations: Abdomen is soft.  Musculoskeletal:     Cervical back: Neck supple.     Right lower leg: No edema.     Left lower leg: No edema.  Skin:    General: Skin is warm.     Capillary Refill: Capillary refill takes less than 2 seconds.  Neurological:     Mental Status: She is alert. Mental status is at baseline.     Gait: Gait abnormal.  Psychiatric:        Mood and Affect: Mood normal.     Comments: Nonverbal, alert to self, does not follow commands     Labs reviewed: Recent Labs    10/22/23 0000 05/30/24 0000  NA 139 138  K 4.3 4.1  CL 102 101  CO2 30* 33*  BUN 14 15  CREATININE 0.6 0.7  CALCIUM  8.4* 9.1   Recent Labs    10/22/23 0000 05/30/24 0000  AST 16 21  ALT 25 29  ALKPHOS 57 59  ALBUMIN 3.6 4.0   Recent Labs    10/22/23 0000 05/30/24 0000  WBC 6.8 7.1  NEUTROABS  --  4,856.00  HGB 12.6 14.1  HCT 39 44  PLT 275 285   Lab Results  Component Value Date   TSH 2.60 03/07/2024   Lab Results  Component Value Date   HGBA1C 6.5 05/30/2024   Lab Results  Component Value Date   CHOL 239 (A) 08/13/2023   HDL 38 08/13/2023   LDLCALC 162 08/13/2023   TRIG 233 (A) 08/13/2023    Significant Diagnostic Results in last 30 days:  No results found.  Assessment/Plan 1. Nipple discharge (Primary) - noted by nursing - h/o breast cancer - exam unremarkable  -  mammogram 2023> no malignancy - will ask family about future mammograms - cbc/diff r/o infection   2. Severe late onset Alzheimer dementia  without behavioral disturbance, psychotic disturbance, mood disturbance, or anxiety (HCC) - BIMS score 0/15 05/27/2024 - dependent with ADLs - weight stable - no agitation - not on medication - cont skilled nursing     Family/ staff Communication: plan discussed with nurse  Labs/tests ordered:  cbc/diff        [1]  Allergies Allergen Reactions   Donepezil Other (See Comments)    Other reaction(s): Other (See Comments) Nightmares   "

## 2024-08-23 ENCOUNTER — Non-Acute Institutional Stay (SKILLED_NURSING_FACILITY): Payer: Self-pay | Admitting: Internal Medicine

## 2024-08-23 ENCOUNTER — Encounter: Payer: Self-pay | Admitting: Internal Medicine

## 2024-08-23 DIAGNOSIS — N6042 Mammary duct ectasia of left breast: Secondary | ICD-10-CM | POA: Diagnosis not present

## 2024-08-23 MED ORDER — DOXYCYCLINE HYCLATE 100 MG PO TABS: 100.0000 mg | ORAL_TABLET | Freq: Two times a day (BID) | ORAL | Status: AC

## 2024-08-23 NOTE — Progress Notes (Signed)
 [facility] SN[facility] Acute Care Progress Note    Location:  Other [facility] Room Number: [name] [id number] Place of Service:  SNF (31)   Laurence Locus, DO   Patient Care Team: Laurence Locus, DO as PCP - General (Internal Medicine) Dessa, Reyes ORN, MD (General Surgery) Requested, Self Rennie Cindy SAUNDERS, MD as Consulting Physician (Internal Medicine)   Extended Emergency Contact Information Primary Emergency Contact: Hershal Ozell ORN Address: [address]          [place], [place] [address] [place] Home Phone: [phone] Mobile Phone: [phone] Relation: Spouse Secondary Emergency Contact: Hershal PONCE Ozell  [place] Home Phone: [phone] Mobile Phone: [phone] Relation: Son   Goals of care: Advanced Directive information    08/23/2024   11:46 AM  Advanced Directives  Does Patient Have a Medical Advance Directive? Yes  Type of Advance Directive Healthcare Power of Attorney  Does patient want to make changes to medical advance directive? No - Patient declined  Copy of Healthcare Power of Attorney in Chart? Yes - validated most recent copy scanned in chart (See row information)     CODE STATUS: Do Not Resuscitate (DNR)   Chief Complaint  Patient presents with   Acute Visit    Left breast drainage     HPI: Pt is a 84 y.o. female seen today for an acute visit for left breast drainage.  Nursing noted some clear drainage from the patient's left nipple this past week.  Patient winces in pain when this area is palpated.  Patient examined with the help of a female chaperone.  Patient is nonverbal and cannot give history.     Past Medical History:  Diagnosis Date   Arthritis    Brain tumor (HCC) 1995   meningeoma   Breast cancer (HCC) 2018   left breast; surgery 01/11/15 with Dr. Dessa; path with invasive mammary and DCIS   Breast cancer of upper-inner quadrant of left female breast (HCC) 12/15/2014    Completed radiation end of December and finished chemotherapy 2 weeks ago, Left breast invasive mammary carcinoma, T1cN11mic (1.5 cm); Grade 3, [name], [name], [name], .   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     Numbness and tingling    right hand    Osteoarthritis    Osteoporosis, post-menopausal    Personal history of chemotherapy    Personal history of radiation therapy    S/P  right THA, AA 09/04/2015   Shoulder pain, right    Wears glasses    Past Surgical History:  Procedure Laterality Date   APPENDECTOMY  1950   BRAIN SURGERY  1995   left frontal/temporal   BREAST BIOPSY Left 12/15/14   confirmed DCIS   BREAST BIOPSY Left 01/08/2015   Procedure: BREAST BIOPSY WITH NEEDLE LOCALIZATION;  Surgeon: Reyes LELON Cota, MD;  Location: [facility] ORS;  Service: General;  Laterality: Left;   BREAST EXCISIONAL BIOPSY Left 1997   BREAST LUMPECTOMY Left 01/08/2015   Procedure: LUMPECTOMY;  Surgeon: Reyes LELON Cota, MD;  Location: [facility] ORS;  Service: General;   Laterality: Left;   COLONOSCOPY  2010   Dr. Geryl   ORIF ANKLE FRACTURE Right 08/05/2017   Procedure: OPEN REDUCTION INTERNAL FIXATION (ORIF) ANKLE FRACTURE;  Surgeon: Cleotilde Barrio, MD;  Location: [facility] ORS;  Service: Orthopedics;  Laterality: Right;   PORTACATH PLACEMENT Right 01/16/2015   Procedure: INSERTION PORT-A-CATH;  Surgeon: Reyes LELON Cota, MD;  Location: [facility] ORS;  Service: General;  Laterality: Right;   SENTINEL NODE BIOPSY Left 01/16/2015   Procedure: SENTINEL NODE BIOPSY;  Surgeon: Reyes LELON Cota, MD;  Location: [facility] ORS;  Service: General;  Laterality: Left;   TOTAL HIP ARTHROPLASTY Right 09/04/2015   Procedure: RIGHT TOTAL HIP ARTHROPLASTY ANTERIOR APPROACH;  Surgeon: Donnice Car, MD;  Location: [facility] ORS;  Service: Orthopedics;  Laterality: Right;     Allergies[1]   Outpatient Encounter Medications as of 08/23/2024  Medication Sig   acetaminophen  (TYLENOL ) 325 MG tablet Take 650 mg by mouth every 8 (eight) hours as needed. Give Two tablets by mouth twice daily   acetaminophen  (TYLENOL ) 325 MG tablet Take 650 mg by mouth 2 (two) times daily.   ARTIFICIAL TEARS 1 % ophthalmic solution Place 1 drop into both eyes 2 (two) times daily.   clobetasol  cream (TEMOVATE ) 0.05 % Apply 1 Application topically 2 (two) times daily. (Patient taking differently: Apply 1 Application topically as needed.)   doxycycline  (VIBRA -TABS) 100 MG tablet Take 100 mg by mouth 2 (two) times daily.   doxycycline  (VIBRA -TABS) 100 MG tablet Take 1 tablet (100 mg total) by mouth 2 (two) times daily for 7 days.   ketoconazole (NIZORAL) 2 % cream Apply 1 Application topically daily. (Patient taking differently: Apply 1 Application topically 2 (two) times daily. Apply to bilateral feet/toes topically two times a day for tinea pedis for 90 days)   levETIRAcetam  (KEPPRA ) 250 MG tablet Take 250 mg by mouth daily.   nystatin (MYCOSTATIN/NYSTOP) powder Apply 1 Application topically 3 (three) times daily as needed.    polyethylene glycol (MIRALAX  / GLYCOLAX ) 17 g packet Take 17 g by mouth daily as needed. One scoop by mouth once daily Monday, Wednesday and Friday.   Travoprost, BAK Free, (TRAVATAN) 0.004 % SOLN ophthalmic solution Place 1 drop into both eyes at bedtime.   No facility-administered encounter medications on file as of 08/23/2024.     Review of Systems  Unable to perform ROS: Dementia     Immunization History  Administered Date(s) Administered   Fluad Quad(high Dose 65+) 05/04/2019   Influenza Inj Mdck Quad Pf 05/21/2022   Influenza Split 03/28/2014   Influenza,inj,Quad PF,6+ Mos 04/27/2016   Influenza-Unspecified 05/01/2017, 05/20/2023, 05/10/2024   PFIZER Comirnaty(Gray Top)Covid-19 Tri-Sucrose Vaccine 08/04/2019, 08/25/2019, 12/07/2020   PFIZER(Purple Top)SARS-COV-2 Vaccination 08/04/2019, 08/25/2019, 04/05/2020, 12/07/2020, 04/24/2023   Pneumococcal Conjugate-13 11/27/2015   Pneumococcal Polysaccharide-23 06/02/2013   Tdap 06/02/2013   Unspecified SARS-COV-2 Vaccination 11/06/2023, 06/06/2024  Zoster Recombinant(Shingrix) 05/17/2020, 05/29/2023   Pertinent  Health Maintenance Due  Topic Date Due   Influenza Vaccine  Completed   Bone Density Scan  Completed   Mammogram  Discontinued      09/24/2021    2:25 PM 11/05/2021    3:19 PM 01/06/2022    2:00 PM 03/23/2023    1:49 PM 06/21/2024   11:03 AM  Fall Risk  Falls in the past year?    1 0  Was there an injury with Fall?    0  0   Fall Risk Category Calculator    2 0  (RETIRED) Patient Fall Risk Level High fall risk  High fall risk  High fall risk     Patient at Risk for Falls Due to    History of fall(s);Impaired balance/gait;Orthopedic patient;Impaired mobility   Fall risk Follow up     Falls evaluation completed     Data saved with a previous flowsheet row definition   Functional Status Survey:     Vitals:   08/23/24 1133  BP: (!) 122/55  Pulse: 82  Resp: 18  Temp: 98 F (36.7 C)  SpO2: 96%  Weight: 209 lb  6.4 oz (95 kg)   Body mass index is 35.94 kg/m. Physical Exam Vitals and nursing note reviewed.  Constitutional:      Comments: Awake but non-verbal  HENT:     Head: Normocephalic and atraumatic.  Skin:    Comments: See picture of left nipple scab and post-clean up picture.           Labs reviewed: Recent Labs    10/22/23 0000 05/30/24 0000  NA 139 138  K 4.3 4.1  CL 102 101  CO2 30* 33*  BUN 14 15  CREATININE 0.6 0.7  CALCIUM  8.4* 9.1   Recent Labs    10/22/23 0000 05/30/24 0000  AST 16 21  ALT 25 29  ALKPHOS 57 59  ALBUMIN 3.6 4.0   Recent Labs    10/22/23 0000 05/30/24 0000  WBC 6.8 7.1  NEUTROABS  --  4,856.00  HGB 12.6 14.1  HCT 39 44  PLT 275 285   Lab Results  Component Value Date   TSH 2.60 03/07/2024   Lab Results  Component Value Date   HGBA1C 6.5 05/30/2024   Lab Results  Component Value Date   CHOL 239 (A) 08/13/2023   HDL 38 08/13/2023   LDLCALC 162 08/13/2023   TRIG 233 (A) 08/13/2023     Assessment and Plan: Periductal mastitis, left I cleaned left nipple area with H2O2. Female chaperone present. Will place on 7 days of po doxycycline . Spoke via phone with pt's dtr michele. She will purchase nipple guards for patient to wear. Pt is not wearing bras on a regular basis. Discussed with dtr that this could be recurrence of pt's known metastatic breast cancer but given pt's severe dementia now, that there is not much that can be done. Will see if short course of abx will clear of this mastitis. If it does not, may need to have further family discussion about progression of disease.          Meds ordered this encounter  Medications   doxycycline  (VIBRA -TABS) 100 MG tablet    Sig: Take 1 tablet (100 mg total) by mouth 2 (two) times daily for 7 days.     Camellia Door, DO [facility] [phone]      [1]  Allergies Allergen Reactions  Donepezil Other (See Comments)    Other reaction(s):  Other (See Comments) Nightmares

## 2024-08-23 NOTE — Assessment & Plan Note (Signed)
 I cleaned left nipple area with H2O2. Female chaperone present. Will place on 7 days of po doxycycline . Spoke via phone with pt's dtr michele. She will purchase nipple guards for patient to wear. Pt is not wearing bras on a regular basis. Discussed with dtr that this could be recurrence of pt's known metastatic breast cancer but given pt's severe dementia now, that there is not much that can be done. Will see if short course of abx will clear of this mastitis. If it does not, may need to have further family discussion about progression of disease.
